# Patient Record
Sex: Male | Born: 1990 | Race: White | Hispanic: No | Marital: Single | Smoking: Current every day smoker
Health system: Southern US, Community
[De-identification: ages and names within clinical notes are randomized; demographics above are authoritative.]

## PROBLEM LIST (undated history)

## (undated) DIAGNOSIS — B1710 Acute hepatitis C without hepatic coma: Secondary | ICD-10-CM

## (undated) DIAGNOSIS — F1110 Opioid abuse, uncomplicated: Secondary | ICD-10-CM

## (undated) DIAGNOSIS — Z9119 Patient's noncompliance with other medical treatment and regimen: Secondary | ICD-10-CM

## (undated) DIAGNOSIS — Z91199 Patient's noncompliance with other medical treatment and regimen due to unspecified reason: Secondary | ICD-10-CM

## (undated) DIAGNOSIS — E109 Type 1 diabetes mellitus without complications: Secondary | ICD-10-CM

## (undated) DIAGNOSIS — I10 Essential (primary) hypertension: Secondary | ICD-10-CM

## (undated) DIAGNOSIS — E119 Type 2 diabetes mellitus without complications: Secondary | ICD-10-CM

## (undated) DIAGNOSIS — I219 Acute myocardial infarction, unspecified: Secondary | ICD-10-CM

## (undated) HISTORY — PX: NO PAST SURGERIES: SHX2092

## (undated) HISTORY — PX: OTHER SURGICAL HISTORY: SHX169

---
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
## 2012-04-17 ENCOUNTER — Inpatient Hospital Stay: Payer: Self-pay | Admitting: Internal Medicine

## 2012-04-17 LAB — URINALYSIS, COMPLETE
Bacteria: NONE SEEN
Bilirubin,UR: NEGATIVE
Blood: NEGATIVE
Glucose,UR: >=500 mg/dL (ref 0–75)
Leukocyte Esterase: NEGATIVE
Nitrite: NEGATIVE
Ph: 5 (ref 4.5–8.0)
Protein: NEGATIVE
RBC,UR: NONE SEEN /HPF (ref 0–5)
Specific Gravity: 1.029 (ref 1.003–1.030)
Squamous Epithelial: <1
WBC UR: 1 /HPF (ref 0–5)

## 2012-04-17 LAB — COMPREHENSIVE METABOLIC PANEL
Albumin: 5.5 g/dL (ref 3.8–5.6)
Alkaline Phosphatase: 289 U/L (ref 98–317)
Anion Gap: 12 (ref 7–16)
BUN: 31 mg/dL — ABNORMAL HIGH (ref 7–18)
Bilirubin,Total: 0.5 mg/dL (ref 0.2–1.0)
Calcium, Total: 10.5 mg/dL (ref 9.0–10.7)
Chloride: 96 mmol/L — ABNORMAL LOW (ref 98–107)
Co2: 26 mmol/L (ref 21–32)
Creatinine: 1.24 mg/dL (ref 0.60–1.30)
EGFR (African American): >60
EGFR (Non-African Amer.): >60
Glucose: 955 mg/dL (ref 65–99)
Osmolality: 322 (ref 275–301)
Potassium: 5.7 mmol/L — ABNORMAL HIGH (ref 3.5–5.1)
SGOT(AST): 81 U/L — ABNORMAL HIGH (ref 10–41)
SGPT (ALT): 112 U/L — ABNORMAL HIGH (ref 12–78)
Sodium: 134 mmol/L — ABNORMAL LOW (ref 136–145)
Total Protein: 9.2 g/dL — ABNORMAL HIGH (ref 6.4–8.6)

## 2012-04-17 LAB — BASIC METABOLIC PANEL
Anion Gap: 10 (ref 7–16)
BUN: 19 mg/dL — ABNORMAL HIGH (ref 7–18)
Calcium, Total: 9 mg/dL (ref 9.0–10.7)
Chloride: 108 mmol/L — ABNORMAL HIGH (ref 98–107)
Co2: 27 mmol/L (ref 21–32)
Creatinine: 0.82 mg/dL (ref 0.60–1.30)
EGFR (African American): >60
EGFR (Non-African Amer.): >60
Glucose: 264 mg/dL — ABNORMAL HIGH (ref 65–99)
Osmolality: 300 (ref 275–301)
Potassium: 4.1 mmol/L (ref 3.5–5.1)
Sodium: 145 mmol/L (ref 136–145)

## 2012-04-17 LAB — CBC
HCT: 46.9 % (ref 40.0–52.0)
HGB: 15.6 g/dL (ref 13.0–18.0)
MCH: 30.2 pg (ref 26.0–34.0)
MCHC: 33.2 g/dL (ref 32.0–36.0)
MCV: 91 fL (ref 80–100)
Platelet: 256 10*3/uL (ref 150–440)
RBC: 5.16 10*6/uL (ref 4.40–5.90)
RDW: 12.1 % (ref 11.5–14.5)
WBC: 10.9 10*3/uL — ABNORMAL HIGH (ref 3.8–10.6)

## 2012-04-17 LAB — LIPASE, BLOOD: Lipase: 70 U/L — ABNORMAL LOW (ref 73–393)

## 2012-04-18 LAB — CBC WITH DIFFERENTIAL/PLATELET
Basophil #: 0 10*3/uL (ref 0.0–0.1)
Basophil %: 0.2 %
Eosinophil #: 0.2 10*3/uL (ref 0.0–0.7)
Eosinophil %: 2.1 %
HCT: 35.9 % — ABNORMAL LOW (ref 40.0–52.0)
HGB: 12.2 g/dL — ABNORMAL LOW (ref 13.0–18.0)
Lymphocyte #: 2.3 10*3/uL (ref 1.0–3.6)
Lymphocyte %: 27.6 %
MCH: 29.9 pg (ref 26.0–34.0)
MCHC: 33.8 g/dL (ref 32.0–36.0)
MCV: 88 fL (ref 80–100)
Monocyte #: 0.4 x10 3/mm (ref 0.2–1.0)
Monocyte %: 5 %
Neutrophil #: 5.4 10*3/uL (ref 1.4–6.5)
Neutrophil %: 65.1 %
Platelet: 175 10*3/uL (ref 150–440)
RBC: 4.07 10*6/uL — ABNORMAL LOW (ref 4.40–5.90)
RDW: 12.3 % (ref 11.5–14.5)
WBC: 8.3 10*3/uL (ref 3.8–10.6)

## 2012-04-18 LAB — BASIC METABOLIC PANEL
Anion Gap: 11 (ref 7–16)
BUN: 19 mg/dL — ABNORMAL HIGH (ref 7–18)
Calcium, Total: 8.5 mg/dL — ABNORMAL LOW (ref 9.0–10.7)
Chloride: 110 mmol/L — ABNORMAL HIGH (ref 98–107)
Co2: 25 mmol/L (ref 21–32)
Creatinine: 0.73 mg/dL (ref 0.60–1.30)
EGFR (African American): >60
EGFR (Non-African Amer.): >60
Glucose: 135 mg/dL — ABNORMAL HIGH (ref 65–99)
Osmolality: 295 (ref 275–301)
Potassium: 3.6 mmol/L (ref 3.5–5.1)
Sodium: 146 mmol/L — ABNORMAL HIGH (ref 136–145)

## 2012-08-16 ENCOUNTER — Emergency Department: Payer: Self-pay | Admitting: Unknown Physician Specialty

## 2012-08-16 LAB — COMPREHENSIVE METABOLIC PANEL
Albumin: 4.3 g/dL (ref 3.8–5.6)
Alkaline Phosphatase: 205 U/L (ref 98–317)
Anion Gap: 16 (ref 7–16)
BUN: 15 mg/dL (ref 7–18)
Bilirubin,Total: 0.5 mg/dL (ref 0.2–1.0)
Calcium, Total: 8.7 mg/dL — ABNORMAL LOW (ref 9.0–10.7)
Chloride: 93 mmol/L — ABNORMAL LOW (ref 98–107)
Co2: 17 mmol/L — ABNORMAL LOW (ref 21–32)
Creatinine: 0.79 mg/dL (ref 0.60–1.30)
EGFR (African American): >60
EGFR (Non-African Amer.): >60
Glucose: 573 mg/dL (ref 65–99)
Osmolality: 281 (ref 275–301)
Potassium: 4.3 mmol/L (ref 3.5–5.1)
SGOT(AST): 82 U/L — ABNORMAL HIGH (ref 10–41)
SGPT (ALT): 174 U/L — ABNORMAL HIGH (ref 12–78)
Sodium: 126 mmol/L — ABNORMAL LOW (ref 136–145)
Total Protein: 8 g/dL (ref 6.4–8.6)

## 2012-08-16 LAB — CBC
HCT: 46.9 % (ref 40.0–52.0)
HGB: 16.1 g/dL (ref 13.0–18.0)
MCH: 30.6 pg (ref 26.0–34.0)
MCHC: 34.3 g/dL (ref 32.0–36.0)
MCV: 89 fL (ref 80–100)
Platelet: 201 10*3/uL (ref 150–440)
RBC: 5.26 10*6/uL (ref 4.40–5.90)
RDW: 12.3 % (ref 11.5–14.5)
WBC: 7.8 10*3/uL (ref 3.8–10.6)

## 2012-08-16 LAB — BASIC METABOLIC PANEL
Anion Gap: 9 (ref 7–16)
BUN: 12 mg/dL (ref 7–18)
Calcium, Total: 7.4 mg/dL — ABNORMAL LOW (ref 9.0–10.7)
Chloride: 107 mmol/L (ref 98–107)
Co2: 23 mmol/L (ref 21–32)
Creatinine: 0.63 mg/dL (ref 0.60–1.30)
EGFR (African American): >60
EGFR (Non-African Amer.): >60
Glucose: 269 mg/dL — ABNORMAL HIGH (ref 65–99)
Osmolality: 287 (ref 275–301)
Potassium: 3.7 mmol/L (ref 3.5–5.1)
Sodium: 139 mmol/L (ref 136–145)

## 2012-08-16 LAB — URINALYSIS, COMPLETE
Bacteria: NONE SEEN
Bilirubin,UR: NEGATIVE
Blood: NEGATIVE
Glucose,UR: >=500 mg/dL (ref 0–75)
Leukocyte Esterase: NEGATIVE
Nitrite: NEGATIVE
Ph: 6 (ref 4.5–8.0)
Protein: NEGATIVE
RBC,UR: NONE SEEN /HPF (ref 0–5)
Specific Gravity: 1.031 (ref 1.003–1.030)
Squamous Epithelial: NONE SEEN
WBC UR: NONE SEEN /HPF (ref 0–5)

## 2012-08-16 LAB — MAGNESIUM: Magnesium: 1.8 mg/dL

## 2012-08-16 LAB — PHOSPHORUS: Phosphorus: 3 mg/dL (ref 2.5–4.9)

## 2012-08-18 ENCOUNTER — Emergency Department: Payer: Self-pay | Admitting: Unknown Physician Specialty

## 2012-08-18 LAB — COMPREHENSIVE METABOLIC PANEL
Albumin: 4.3 g/dL (ref 3.8–5.6)
Alkaline Phosphatase: 190 U/L (ref 98–317)
Anion Gap: 12 (ref 7–16)
BUN: 17 mg/dL (ref 7–18)
Bilirubin,Total: 0.5 mg/dL (ref 0.2–1.0)
Calcium, Total: 9.1 mg/dL (ref 9.0–10.7)
Chloride: 97 mmol/L — ABNORMAL LOW (ref 98–107)
Co2: 27 mmol/L (ref 21–32)
Creatinine: 0.88 mg/dL (ref 0.60–1.30)
EGFR (African American): >60
EGFR (Non-African Amer.): >60
Glucose: 382 mg/dL — ABNORMAL HIGH (ref 65–99)
Osmolality: 289 (ref 275–301)
Potassium: 4 mmol/L (ref 3.5–5.1)
SGOT(AST): 85 U/L — ABNORMAL HIGH (ref 10–41)
SGPT (ALT): 187 U/L — ABNORMAL HIGH (ref 12–78)
Sodium: 136 mmol/L (ref 136–145)
Total Protein: 7.7 g/dL (ref 6.4–8.6)

## 2012-08-18 LAB — URINALYSIS, COMPLETE
Bilirubin,UR: NEGATIVE
Blood: NEGATIVE
Glucose,UR: >=500 mg/dL (ref 0–75)
Leukocyte Esterase: NEGATIVE
Nitrite: NEGATIVE
Ph: 6 (ref 4.5–8.0)
Protein: NEGATIVE
RBC,UR: NONE SEEN /HPF (ref 0–5)
Specific Gravity: 1.038 (ref 1.003–1.030)
Squamous Epithelial: NONE SEEN
WBC UR: NONE SEEN /HPF (ref 0–5)

## 2012-08-18 LAB — DRUG SCREEN, URINE
Amphetamines, Ur Screen: NEGATIVE (ref ?–1000)
Barbiturates, Ur Screen: NEGATIVE (ref ?–200)
Benzodiazepine, Ur Scrn: NEGATIVE (ref ?–200)
Cannabinoid 50 Ng, Ur ~~LOC~~: POSITIVE (ref ?–50)
Cocaine Metabolite,Ur ~~LOC~~: NEGATIVE (ref ?–300)
MDMA (Ecstasy)Ur Screen: NEGATIVE (ref ?–500)
Methadone, Ur Screen: NEGATIVE (ref ?–300)
Opiate, Ur Screen: NEGATIVE (ref ?–300)
Phencyclidine (PCP) Ur S: NEGATIVE (ref ?–25)
Tricyclic, Ur Screen: NEGATIVE (ref ?–1000)

## 2012-08-18 LAB — CBC
HCT: 46.4 % (ref 40.0–52.0)
HGB: 16 g/dL (ref 13.0–18.0)
MCH: 30.1 pg (ref 26.0–34.0)
MCHC: 34.5 g/dL (ref 32.0–36.0)
MCV: 87 fL (ref 80–100)
Platelet: 168 10*3/uL (ref 150–440)
RBC: 5.3 10*6/uL (ref 4.40–5.90)
RDW: 11.9 % (ref 11.5–14.5)
WBC: 7.1 10*3/uL (ref 3.8–10.6)

## 2012-08-18 LAB — LIPASE, BLOOD: Lipase: 75 U/L (ref 73–393)

## 2012-08-18 LAB — MAGNESIUM: Magnesium: 1.8 mg/dL

## 2012-11-01 ENCOUNTER — Emergency Department: Payer: Self-pay | Admitting: Internal Medicine

## 2012-11-10 ENCOUNTER — Emergency Department: Payer: Self-pay | Admitting: Emergency Medicine

## 2013-01-05 ENCOUNTER — Emergency Department: Payer: Self-pay | Admitting: Emergency Medicine

## 2013-01-05 LAB — COMPREHENSIVE METABOLIC PANEL
Albumin: 3.9 g/dL (ref 3.4–5.0)
Alkaline Phosphatase: 161 U/L — ABNORMAL HIGH (ref 50–136)
Anion Gap: 6 — ABNORMAL LOW (ref 7–16)
BUN: 14 mg/dL (ref 7–18)
Bilirubin,Total: 0.2 mg/dL (ref 0.2–1.0)
Calcium, Total: 8.9 mg/dL (ref 8.5–10.1)
Chloride: 101 mmol/L (ref 98–107)
Co2: 27 mmol/L (ref 21–32)
Creatinine: 0.87 mg/dL (ref 0.60–1.30)
EGFR (African American): >60
EGFR (Non-African Amer.): >60
Glucose: 353 mg/dL — ABNORMAL HIGH (ref 65–99)
Osmolality: 283 (ref 275–301)
Potassium: 3.9 mmol/L (ref 3.5–5.1)
SGOT(AST): 28 U/L (ref 15–37)
SGPT (ALT): 33 U/L (ref 12–78)
Sodium: 134 mmol/L — ABNORMAL LOW (ref 136–145)
Total Protein: 7.3 g/dL (ref 6.4–8.2)

## 2013-01-05 LAB — CBC
HCT: 43 % (ref 40.0–52.0)
HGB: 14.5 g/dL (ref 13.0–18.0)
MCH: 29.3 pg (ref 26.0–34.0)
MCHC: 33.8 g/dL (ref 32.0–36.0)
MCV: 87 fL (ref 80–100)
Platelet: 206 10*3/uL (ref 150–440)
RBC: 4.96 10*6/uL (ref 4.40–5.90)
RDW: 12 % (ref 11.5–14.5)
WBC: 7.4 10*3/uL (ref 3.8–10.6)

## 2013-01-29 ENCOUNTER — Inpatient Hospital Stay: Payer: Self-pay | Admitting: Internal Medicine

## 2013-01-29 LAB — CBC
HCT: 46 % (ref 40.0–52.0)
HGB: 15.2 g/dL (ref 13.0–18.0)
MCH: 29.5 pg (ref 26.0–34.0)
MCHC: 33.1 g/dL (ref 32.0–36.0)
MCV: 89 fL (ref 80–100)
Platelet: 193 10*3/uL (ref 150–440)
RBC: 5.16 10*6/uL (ref 4.40–5.90)
RDW: 12.1 % (ref 11.5–14.5)
WBC: 11.4 10*3/uL — ABNORMAL HIGH (ref 3.8–10.6)

## 2013-01-29 LAB — URINALYSIS, COMPLETE
Bacteria: NONE SEEN
Bilirubin,UR: NEGATIVE
Blood: NEGATIVE
Glucose,UR: >=500 mg/dL (ref 0–75)
Leukocyte Esterase: NEGATIVE
Nitrite: NEGATIVE
Ph: 5 (ref 4.5–8.0)
Protein: NEGATIVE
RBC,UR: <1 /HPF (ref 0–5)
Specific Gravity: 1.025 (ref 1.003–1.030)
Squamous Epithelial: NONE SEEN
WBC UR: NONE SEEN /HPF (ref 0–5)

## 2013-01-29 LAB — BASIC METABOLIC PANEL
Anion Gap: 7 (ref 7–16)
BUN: 13 mg/dL (ref 7–18)
Calcium, Total: 8 mg/dL — ABNORMAL LOW (ref 8.5–10.1)
Chloride: 111 mmol/L — ABNORMAL HIGH (ref 98–107)
Co2: 24 mmol/L (ref 21–32)
Creatinine: 0.69 mg/dL (ref 0.60–1.30)
EGFR (African American): >60
EGFR (Non-African Amer.): >60
Glucose: 121 mg/dL — ABNORMAL HIGH (ref 65–99)
Osmolality: 284 (ref 275–301)
Potassium: 3.4 mmol/L — ABNORMAL LOW (ref 3.5–5.1)
Sodium: 142 mmol/L (ref 136–145)

## 2013-01-29 LAB — COMPREHENSIVE METABOLIC PANEL
Albumin: 4.3 g/dL (ref 3.4–5.0)
Alkaline Phosphatase: 348 U/L — ABNORMAL HIGH (ref 50–136)
Anion Gap: 12 (ref 7–16)
BUN: 23 mg/dL — ABNORMAL HIGH (ref 7–18)
Bilirubin,Total: 0.6 mg/dL (ref 0.2–1.0)
Calcium, Total: 9.1 mg/dL (ref 8.5–10.1)
Chloride: 83 mmol/L — ABNORMAL LOW (ref 98–107)
Co2: 24 mmol/L (ref 21–32)
Creatinine: 1.11 mg/dL (ref 0.60–1.30)
EGFR (African American): >60
EGFR (Non-African Amer.): >60
Glucose: 1115 mg/dL (ref 65–99)
Osmolality: 300 (ref 275–301)
Potassium: 4.7 mmol/L (ref 3.5–5.1)
SGOT(AST): 79 U/L — ABNORMAL HIGH (ref 15–37)
SGPT (ALT): 132 U/L — ABNORMAL HIGH (ref 12–78)
Sodium: 119 mmol/L — CL (ref 136–145)
Total Protein: 7.8 g/dL (ref 6.4–8.2)

## 2013-02-23 ENCOUNTER — Emergency Department: Payer: Self-pay | Admitting: Emergency Medicine

## 2013-07-27 ENCOUNTER — Emergency Department: Payer: Self-pay | Admitting: Emergency Medicine

## 2013-11-18 ENCOUNTER — Emergency Department: Payer: Self-pay | Admitting: Emergency Medicine

## 2013-11-21 LAB — BETA STREP CULTURE(ARMC)

## 2013-12-10 ENCOUNTER — Inpatient Hospital Stay: Payer: Self-pay | Admitting: Family Medicine

## 2013-12-10 LAB — BASIC METABOLIC PANEL
Anion Gap: 16 (ref 7–16)
Anion Gap: 13 (ref 7–16)
BUN: 18 mg/dL (ref 7–18)
BUN: 17 mg/dL (ref 7–18)
Calcium, Total: 8.6 mg/dL (ref 8.5–10.1)
Calcium, Total: 8.1 mg/dL — ABNORMAL LOW (ref 8.5–10.1)
Chloride: 111 mmol/L — ABNORMAL HIGH (ref 98–107)
Chloride: 109 mmol/L — ABNORMAL HIGH (ref 98–107)
Co2: 12 mmol/L — ABNORMAL LOW (ref 21–32)
Co2: 18 mmol/L — ABNORMAL LOW (ref 21–32)
Creatinine: 1.05 mg/dL (ref 0.60–1.30)
Creatinine: 0.93 mg/dL (ref 0.60–1.30)
EGFR (African American): >60
EGFR (African American): >60
EGFR (Non-African Amer.): >60
EGFR (Non-African Amer.): >60
Glucose: 287 mg/dL — ABNORMAL HIGH (ref 65–99)
Glucose: 259 mg/dL — ABNORMAL HIGH (ref 65–99)
Osmolality: 290 (ref 275–301)
Osmolality: 290 (ref 275–301)
Potassium: 4.1 mmol/L (ref 3.5–5.1)
Potassium: 3.7 mmol/L (ref 3.5–5.1)
Sodium: 139 mmol/L (ref 136–145)
Sodium: 140 mmol/L (ref 136–145)

## 2013-12-10 LAB — COMPREHENSIVE METABOLIC PANEL
Albumin: 4.4 g/dL (ref 3.4–5.0)
Alkaline Phosphatase: 243 U/L — ABNORMAL HIGH
Anion Gap: 22 — ABNORMAL HIGH (ref 7–16)
BUN: 19 mg/dL — ABNORMAL HIGH (ref 7–18)
Bilirubin,Total: 0.4 mg/dL (ref 0.2–1.0)
Calcium, Total: 9.1 mg/dL (ref 8.5–10.1)
Chloride: 100 mmol/L (ref 98–107)
Co2: 11 mmol/L — ABNORMAL LOW (ref 21–32)
Creatinine: 1.23 mg/dL (ref 0.60–1.30)
EGFR (African American): >60
EGFR (Non-African Amer.): >60
Glucose: 496 mg/dL — ABNORMAL HIGH (ref 65–99)
Osmolality: 291 (ref 275–301)
Potassium: 4.4 mmol/L (ref 3.5–5.1)
SGOT(AST): 32 U/L (ref 15–37)
SGPT (ALT): 58 U/L (ref 12–78)
Sodium: 133 mmol/L — ABNORMAL LOW (ref 136–145)
Total Protein: 8.2 g/dL (ref 6.4–8.2)

## 2013-12-10 LAB — URINALYSIS, COMPLETE
Bacteria: NONE SEEN
Bilirubin,UR: NEGATIVE
Blood: NEGATIVE
Glucose,UR: >=500 mg/dL (ref 0–75)
Leukocyte Esterase: NEGATIVE
Nitrite: NEGATIVE
Ph: 5 (ref 4.5–8.0)
Protein: NEGATIVE
RBC,UR: <1 /HPF (ref 0–5)
Specific Gravity: 1.029 (ref 1.003–1.030)
Squamous Epithelial: NONE SEEN
WBC UR: <1 /HPF (ref 0–5)

## 2013-12-10 LAB — DRUG SCREEN, URINE
Amphetamines, Ur Screen: NEGATIVE (ref ?–1000)
Barbiturates, Ur Screen: NEGATIVE (ref ?–200)
Benzodiazepine, Ur Scrn: NEGATIVE (ref ?–200)
Cannabinoid 50 Ng, Ur ~~LOC~~: NEGATIVE (ref ?–50)
Cocaine Metabolite,Ur ~~LOC~~: POSITIVE (ref ?–300)
MDMA (Ecstasy)Ur Screen: NEGATIVE (ref ?–500)
Methadone, Ur Screen: NEGATIVE (ref ?–300)
Opiate, Ur Screen: NEGATIVE (ref ?–300)
Phencyclidine (PCP) Ur S: NEGATIVE (ref ?–25)
Tricyclic, Ur Screen: NEGATIVE (ref ?–1000)

## 2013-12-10 LAB — CBC
HCT: 49.4 % (ref 40.0–52.0)
HGB: 16 g/dL (ref 13.0–18.0)
MCH: 29.6 pg (ref 26.0–34.0)
MCHC: 32.4 g/dL (ref 32.0–36.0)
MCV: 92 fL (ref 80–100)
Platelet: 211 10*3/uL (ref 150–440)
RBC: 5.41 10*6/uL (ref 4.40–5.90)
RDW: 13.6 % (ref 11.5–14.5)
WBC: 12.7 10*3/uL — ABNORMAL HIGH (ref 3.8–10.6)

## 2013-12-10 LAB — PHOSPHORUS: Phosphorus: 2.6 mg/dL (ref 2.5–4.9)

## 2013-12-10 LAB — ETHANOL
Ethanol %: <0.003 % (ref 0.000–0.080)
Ethanol: <3 mg/dL

## 2013-12-10 LAB — LIPASE, BLOOD: Lipase: 60 U/L — ABNORMAL LOW (ref 73–393)

## 2013-12-10 LAB — MAGNESIUM
Magnesium: 2 mg/dL
Magnesium: 1.9 mg/dL

## 2013-12-10 LAB — TROPONIN I: Troponin-I: <0.02 ng/mL

## 2013-12-10 LAB — BETA-HYDROXYBUTYRIC ACID: Beta-Hydroxybutyrate: >46 mg/dL (ref 0.2–2.8)

## 2013-12-11 LAB — COMPREHENSIVE METABOLIC PANEL
Albumin: 2.9 g/dL — ABNORMAL LOW (ref 3.4–5.0)
Alkaline Phosphatase: 118 U/L — ABNORMAL HIGH
Anion Gap: 6 — ABNORMAL LOW (ref 7–16)
BUN: 14 mg/dL (ref 7–18)
Bilirubin,Total: 0.4 mg/dL (ref 0.2–1.0)
Calcium, Total: 8.1 mg/dL — ABNORMAL LOW (ref 8.5–10.1)
Chloride: 110 mmol/L — ABNORMAL HIGH (ref 98–107)
Co2: 23 mmol/L (ref 21–32)
Creatinine: 0.79 mg/dL (ref 0.60–1.30)
EGFR (African American): >60
EGFR (Non-African Amer.): >60
Glucose: 82 mg/dL (ref 65–99)
Osmolality: 277 (ref 275–301)
Potassium: 3.2 mmol/L — ABNORMAL LOW (ref 3.5–5.1)
SGOT(AST): 34 U/L (ref 15–37)
SGPT (ALT): 37 U/L (ref 12–78)
Sodium: 139 mmol/L (ref 136–145)
Total Protein: 5.6 g/dL — ABNORMAL LOW (ref 6.4–8.2)

## 2013-12-11 LAB — CBC WITH DIFFERENTIAL/PLATELET
Basophil #: 0 10*3/uL (ref 0.0–0.1)
Basophil %: 0.2 %
Eosinophil #: 0.2 10*3/uL (ref 0.0–0.7)
Eosinophil %: 2 %
HCT: 42.1 % (ref 40.0–52.0)
HGB: 14.4 g/dL (ref 13.0–18.0)
Lymphocyte #: 2.5 10*3/uL (ref 1.0–3.6)
Lymphocyte %: 28.9 %
MCH: 30.4 pg (ref 26.0–34.0)
MCHC: 34.2 g/dL (ref 32.0–36.0)
MCV: 89 fL (ref 80–100)
Monocyte #: 0.5 x10 3/mm (ref 0.2–1.0)
Monocyte %: 5.7 %
Neutrophil #: 5.6 10*3/uL (ref 1.4–6.5)
Neutrophil %: 63.2 %
Platelet: 163 10*3/uL (ref 150–440)
RBC: 4.73 10*6/uL (ref 4.40–5.90)
RDW: 13.4 % (ref 11.5–14.5)
WBC: 8.8 10*3/uL (ref 3.8–10.6)

## 2013-12-11 LAB — LIPID PANEL
Cholesterol: 145 mg/dL (ref 0–200)
HDL Cholesterol: 28 mg/dL — ABNORMAL LOW (ref 40–60)
Ldl Cholesterol, Calc: 96 mg/dL (ref 0–100)
Triglycerides: 107 mg/dL (ref 0–200)
VLDL Cholesterol, Calc: 21 mg/dL (ref 5–40)

## 2013-12-11 LAB — HEMOGLOBIN A1C: Hemoglobin A1C: 15.5 % — ABNORMAL HIGH (ref 4.2–6.3)

## 2013-12-11 LAB — MAGNESIUM: Magnesium: 1.6 mg/dL — ABNORMAL LOW

## 2013-12-12 LAB — BASIC METABOLIC PANEL
Anion Gap: 2 — ABNORMAL LOW (ref 7–16)
BUN: 6 mg/dL — ABNORMAL LOW (ref 7–18)
Calcium, Total: 7.7 mg/dL — ABNORMAL LOW (ref 8.5–10.1)
Chloride: 107 mmol/L (ref 98–107)
Co2: 29 mmol/L (ref 21–32)
Creatinine: 0.52 mg/dL — ABNORMAL LOW (ref 0.60–1.30)
EGFR (African American): >60
EGFR (Non-African Amer.): >60
Glucose: 155 mg/dL — ABNORMAL HIGH (ref 65–99)
Osmolality: 276 (ref 275–301)
Potassium: 2.9 mmol/L — ABNORMAL LOW (ref 3.5–5.1)
Sodium: 138 mmol/L (ref 136–145)

## 2013-12-12 LAB — MAGNESIUM: Magnesium: 1.6 mg/dL — ABNORMAL LOW

## 2014-02-04 ENCOUNTER — Emergency Department: Payer: Self-pay | Admitting: Emergency Medicine

## 2014-02-04 LAB — URINALYSIS, COMPLETE
Bacteria: NONE SEEN
Glucose,UR: >=500 mg/dL (ref 0–75)
Leukocyte Esterase: NEGATIVE
Nitrite: NEGATIVE
Ph: 5 (ref 4.5–8.0)
Protein: 30
RBC,UR: 1 /HPF (ref 0–5)
Specific Gravity: 1.043 (ref 1.003–1.030)
Squamous Epithelial: NONE SEEN
WBC UR: 1 /HPF (ref 0–5)

## 2014-02-04 LAB — CBC WITH DIFFERENTIAL/PLATELET
Basophil #: 0.1 10*3/uL (ref 0.0–0.1)
Basophil %: 0.6 %
Eosinophil #: 0.1 10*3/uL (ref 0.0–0.7)
Eosinophil %: 0.4 %
HCT: 52.1 % — ABNORMAL HIGH (ref 40.0–52.0)
HGB: 17.3 g/dL (ref 13.0–18.0)
Lymphocyte #: 0.9 10*3/uL — ABNORMAL LOW (ref 1.0–3.6)
Lymphocyte %: 5.4 %
MCH: 30.3 pg (ref 26.0–34.0)
MCHC: 33.2 g/dL (ref 32.0–36.0)
MCV: 91 fL (ref 80–100)
Monocyte #: 0.6 x10 3/mm (ref 0.2–1.0)
Monocyte %: 3.6 %
Neutrophil #: 15.1 10*3/uL — ABNORMAL HIGH (ref 1.4–6.5)
Neutrophil %: 90 %
Platelet: 239 10*3/uL (ref 150–440)
RBC: 5.7 10*6/uL (ref 4.40–5.90)
RDW: 13.2 % (ref 11.5–14.5)
WBC: 16.8 10*3/uL — ABNORMAL HIGH (ref 3.8–10.6)

## 2014-02-04 LAB — COMPREHENSIVE METABOLIC PANEL
Albumin: 4.6 g/dL (ref 3.4–5.0)
Alkaline Phosphatase: 222 U/L — ABNORMAL HIGH
Anion Gap: 5 — ABNORMAL LOW (ref 7–16)
BUN: 15 mg/dL (ref 7–18)
Bilirubin,Total: 0.8 mg/dL (ref 0.2–1.0)
Calcium, Total: 9.6 mg/dL (ref 8.5–10.1)
Chloride: 101 mmol/L (ref 98–107)
Co2: 28 mmol/L (ref 21–32)
Creatinine: 0.92 mg/dL (ref 0.60–1.30)
EGFR (African American): >60
EGFR (Non-African Amer.): >60
Glucose: 343 mg/dL — ABNORMAL HIGH (ref 65–99)
Osmolality: 283 (ref 275–301)
Potassium: 4.2 mmol/L (ref 3.5–5.1)
SGOT(AST): 46 U/L — ABNORMAL HIGH (ref 15–37)
SGPT (ALT): 83 U/L — ABNORMAL HIGH (ref 12–78)
Sodium: 134 mmol/L — ABNORMAL LOW (ref 136–145)
Total Protein: 9 g/dL — ABNORMAL HIGH (ref 6.4–8.2)

## 2014-02-04 LAB — LIPASE, BLOOD: Lipase: 49 U/L — ABNORMAL LOW (ref 73–393)

## 2014-02-06 LAB — BETA STREP CULTURE(ARMC)

## 2014-03-14 ENCOUNTER — Emergency Department: Payer: Self-pay | Admitting: Student

## 2014-03-14 LAB — CBC WITH DIFFERENTIAL/PLATELET
Basophil #: 0.1 10*3/uL (ref 0.0–0.1)
Basophil %: 1.3 %
Eosinophil #: 0.1 10*3/uL (ref 0.0–0.7)
Eosinophil %: 1.4 %
HCT: 42 % (ref 40.0–52.0)
HGB: 14.9 g/dL (ref 13.0–18.0)
Lymphocyte #: 2.1 10*3/uL (ref 1.0–3.6)
Lymphocyte %: 24.7 %
MCH: 31.8 pg (ref 26.0–34.0)
MCHC: 35.5 g/dL (ref 32.0–36.0)
MCV: 90 fL (ref 80–100)
Monocyte #: 0.7 x10 3/mm (ref 0.2–1.0)
Monocyte %: 7.6 %
Neutrophil #: 5.7 10*3/uL (ref 1.4–6.5)
Neutrophil %: 65 %
Platelet: 233 10*3/uL (ref 150–440)
RBC: 4.69 10*6/uL (ref 4.40–5.90)
RDW: 12.2 % (ref 11.5–14.5)
WBC: 8.7 10*3/uL (ref 3.8–10.6)

## 2014-03-14 LAB — BASIC METABOLIC PANEL
Anion Gap: 9 (ref 7–16)
BUN: 17 mg/dL (ref 7–18)
Calcium, Total: 8.4 mg/dL — ABNORMAL LOW (ref 8.5–10.1)
Chloride: 93 mmol/L — ABNORMAL LOW (ref 98–107)
Co2: 27 mmol/L (ref 21–32)
Creatinine: 0.83 mg/dL (ref 0.60–1.30)
EGFR (African American): >60
EGFR (Non-African Amer.): >60
Glucose: 691 mg/dL (ref 65–99)
Osmolality: 293 (ref 275–301)
Potassium: 4.1 mmol/L (ref 3.5–5.1)
Sodium: 129 mmol/L — ABNORMAL LOW (ref 136–145)

## 2014-03-14 LAB — URINALYSIS, COMPLETE
Bacteria: NONE SEEN
Bilirubin,UR: NEGATIVE
Blood: NEGATIVE
Glucose,UR: >=500 mg/dL (ref 0–75)
Leukocyte Esterase: NEGATIVE
Nitrite: NEGATIVE
Ph: 7 (ref 4.5–8.0)
Protein: NEGATIVE
RBC,UR: NONE SEEN /HPF (ref 0–5)
Specific Gravity: 1.03 (ref 1.003–1.030)
Squamous Epithelial: NONE SEEN
WBC UR: NONE SEEN /HPF (ref 0–5)

## 2014-03-14 LAB — TROPONIN I: Troponin-I: <0.02 ng/mL

## 2014-03-14 LAB — BETA-HYDROXYBUTYRIC ACID: Beta-Hydroxybutyrate: 9.33 mg/dL — ABNORMAL HIGH (ref 0.2–2.8)

## 2014-03-16 ENCOUNTER — Emergency Department: Payer: Self-pay | Admitting: Emergency Medicine

## 2014-03-16 LAB — CBC WITH DIFFERENTIAL/PLATELET
Basophil #: 0 10*3/uL (ref 0.0–0.1)
Basophil %: 0.5 %
Eosinophil #: 0.1 10*3/uL (ref 0.0–0.7)
Eosinophil %: 1.7 %
HCT: 44.7 % (ref 40.0–52.0)
HGB: 14.4 g/dL (ref 13.0–18.0)
Lymphocyte #: 1.9 10*3/uL (ref 1.0–3.6)
Lymphocyte %: 25.4 %
MCH: 29.7 pg (ref 26.0–34.0)
MCHC: 32.2 g/dL (ref 32.0–36.0)
MCV: 92 fL (ref 80–100)
Monocyte #: 0.5 x10 3/mm (ref 0.2–1.0)
Monocyte %: 6.5 %
Neutrophil #: 5 10*3/uL (ref 1.4–6.5)
Neutrophil %: 65.9 %
Platelet: 218 10*3/uL (ref 150–440)
RBC: 4.85 10*6/uL (ref 4.40–5.90)
RDW: 12.4 % (ref 11.5–14.5)
WBC: 7.6 10*3/uL (ref 3.8–10.6)

## 2014-03-16 LAB — BASIC METABOLIC PANEL
Anion Gap: 9 (ref 7–16)
BUN: 12 mg/dL (ref 7–18)
Calcium, Total: 8.7 mg/dL (ref 8.5–10.1)
Chloride: 96 mmol/L — ABNORMAL LOW (ref 98–107)
Co2: 25 mmol/L (ref 21–32)
Creatinine: 0.83 mg/dL (ref 0.60–1.30)
EGFR (African American): >60
EGFR (Non-African Amer.): >60
Glucose: 664 mg/dL (ref 65–99)
Osmolality: 292 (ref 275–301)
Potassium: 4.3 mmol/L (ref 3.5–5.1)
Sodium: 130 mmol/L — ABNORMAL LOW (ref 136–145)

## 2014-04-05 ENCOUNTER — Observation Stay: Payer: Self-pay | Admitting: Internal Medicine

## 2014-04-05 LAB — CBC WITH DIFFERENTIAL/PLATELET
Basophil #: 0 10*3/uL (ref 0.0–0.1)
Basophil %: 0.2 %
Eosinophil #: 0.1 10*3/uL (ref 0.0–0.7)
Eosinophil %: 0.6 %
HCT: 43.4 % (ref 40.0–52.0)
HGB: 14.6 g/dL (ref 13.0–18.0)
Lymphocyte #: 2 10*3/uL (ref 1.0–3.6)
Lymphocyte %: 14.7 %
MCH: 30.2 pg (ref 26.0–34.0)
MCHC: 33.6 g/dL (ref 32.0–36.0)
MCV: 90 fL (ref 80–100)
Monocyte #: 1.1 x10 3/mm — ABNORMAL HIGH (ref 0.2–1.0)
Monocyte %: 8.1 %
Neutrophil #: 10.4 10*3/uL — ABNORMAL HIGH (ref 1.4–6.5)
Neutrophil %: 76.4 %
Platelet: 176 10*3/uL (ref 150–440)
RBC: 4.82 10*6/uL (ref 4.40–5.90)
RDW: 12.5 % (ref 11.5–14.5)
WBC: 13.7 10*3/uL — ABNORMAL HIGH (ref 3.8–10.6)

## 2014-04-05 LAB — MONONUCLEOSIS SCREEN: Mono Test: NEGATIVE

## 2014-04-05 LAB — COMPREHENSIVE METABOLIC PANEL
Albumin: 3.4 g/dL (ref 3.4–5.0)
Alkaline Phosphatase: 196 U/L — ABNORMAL HIGH
Anion Gap: 9 (ref 7–16)
BUN: 17 mg/dL (ref 7–18)
Bilirubin,Total: 0.3 mg/dL (ref 0.2–1.0)
Calcium, Total: 8.8 mg/dL (ref 8.5–10.1)
Chloride: 98 mmol/L (ref 98–107)
Co2: 26 mmol/L (ref 21–32)
Creatinine: 0.84 mg/dL (ref 0.60–1.30)
EGFR (African American): >60
EGFR (Non-African Amer.): >60
Glucose: 426 mg/dL — ABNORMAL HIGH (ref 65–99)
Osmolality: 286 (ref 275–301)
Potassium: 3.9 mmol/L (ref 3.5–5.1)
SGOT(AST): 24 U/L (ref 15–37)
SGPT (ALT): 34 U/L
Sodium: 133 mmol/L — ABNORMAL LOW (ref 136–145)
Total Protein: 7.3 g/dL (ref 6.4–8.2)

## 2014-04-06 LAB — BASIC METABOLIC PANEL
Anion Gap: 7 (ref 7–16)
BUN: 13 mg/dL (ref 7–18)
Calcium, Total: 8.1 mg/dL — ABNORMAL LOW (ref 8.5–10.1)
Chloride: 105 mmol/L (ref 98–107)
Co2: 25 mmol/L (ref 21–32)
Creatinine: 0.64 mg/dL (ref 0.60–1.30)
EGFR (African American): >60
EGFR (Non-African Amer.): >60
Glucose: 257 mg/dL — ABNORMAL HIGH (ref 65–99)
Osmolality: 283 (ref 275–301)
Potassium: 3.8 mmol/L (ref 3.5–5.1)
Sodium: 137 mmol/L (ref 136–145)

## 2014-04-06 LAB — CBC WITH DIFFERENTIAL/PLATELET
Basophil #: 0 10*3/uL (ref 0.0–0.1)
Basophil %: 0.3 %
Eosinophil #: 0.1 10*3/uL (ref 0.0–0.7)
Eosinophil %: 0.9 %
HCT: 38.7 % — ABNORMAL LOW (ref 40.0–52.0)
HGB: 13.1 g/dL (ref 13.0–18.0)
Lymphocyte #: 2.5 10*3/uL (ref 1.0–3.6)
Lymphocyte %: 19.7 %
MCH: 30.7 pg (ref 26.0–34.0)
MCHC: 33.8 g/dL (ref 32.0–36.0)
MCV: 91 fL (ref 80–100)
Monocyte #: 1.2 x10 3/mm — ABNORMAL HIGH (ref 0.2–1.0)
Monocyte %: 9.3 %
Neutrophil #: 8.7 10*3/uL — ABNORMAL HIGH (ref 1.4–6.5)
Neutrophil %: 69.8 %
Platelet: 157 10*3/uL (ref 150–440)
RBC: 4.27 10*6/uL — ABNORMAL LOW (ref 4.40–5.90)
RDW: 12.7 % (ref 11.5–14.5)
WBC: 12.5 10*3/uL — ABNORMAL HIGH (ref 3.8–10.6)

## 2014-04-06 LAB — URINALYSIS, COMPLETE
Bacteria: NONE SEEN
Bilirubin,UR: NEGATIVE
Blood: NEGATIVE
Glucose,UR: >=500 mg/dL (ref 0–75)
Leukocyte Esterase: NEGATIVE
Nitrite: NEGATIVE
Ph: 6 (ref 4.5–8.0)
Protein: NEGATIVE
RBC,UR: NONE SEEN /HPF (ref 0–5)
Specific Gravity: 1.026 (ref 1.003–1.030)
Squamous Epithelial: NONE SEEN
WBC UR: NONE SEEN /HPF (ref 0–5)

## 2014-04-06 LAB — HEMOGLOBIN A1C: Hemoglobin A1C: 15 % — ABNORMAL HIGH (ref 4.2–6.3)

## 2014-04-08 LAB — BETA STREP CULTURE(ARMC)

## 2014-04-10 LAB — CULTURE, BLOOD (SINGLE)

## 2014-06-25 ENCOUNTER — Emergency Department (HOSPITAL_COMMUNITY)
Admission: EM | Admit: 2014-06-25 | Discharge: 2014-06-25 | Disposition: A | Discharge status: Home / Self Care | Payer: Self-pay | Attending: Emergency Medicine | Admitting: Emergency Medicine

## 2014-06-25 ENCOUNTER — Encounter (HOSPITAL_COMMUNITY): Payer: Self-pay | Admitting: *Deleted

## 2014-06-25 DIAGNOSIS — K006 Disturbances in tooth eruption: Secondary | ICD-10-CM | POA: Insufficient documentation

## 2014-06-25 DIAGNOSIS — K047 Periapical abscess without sinus: Secondary | ICD-10-CM | POA: Insufficient documentation

## 2014-06-25 DIAGNOSIS — K0510 Chronic gingivitis, plaque induced: Secondary | ICD-10-CM | POA: Insufficient documentation

## 2014-06-25 DIAGNOSIS — Z720 Tobacco use: Secondary | ICD-10-CM | POA: Insufficient documentation

## 2014-06-25 DIAGNOSIS — K029 Dental caries, unspecified: Secondary | ICD-10-CM | POA: Insufficient documentation

## 2014-06-25 MED ORDER — NAPROXEN 500 MG PO TABS: 500.0000 mg | ORAL_TABLET | Freq: Two times a day (BID) | ORAL | Status: DC

## 2014-06-25 MED ORDER — AMOXICILLIN 250 MG PO CAPS
500.0000 mg | ORAL_CAPSULE | Freq: Once | ORAL | Status: AC
  Administered 2014-06-25: 500 mg via ORAL
  Filled 2014-06-25: qty 2

## 2014-06-25 MED ORDER — AMOXICILLIN 500 MG PO CAPS: 500.0000 mg | ORAL_CAPSULE | Freq: Three times a day (TID) | ORAL | Status: DC

## 2014-06-25 MED ORDER — NAPROXEN 250 MG PO TABS
500.0000 mg | ORAL_TABLET | Freq: Once | ORAL | Status: AC
  Administered 2014-06-25: 500 mg via ORAL
  Filled 2014-06-25: qty 2

## 2014-06-25 NOTE — ED Notes (Signed)
Pt c/o dental pain. Has a dentist appt Monday can't remember name.

## 2014-06-25 NOTE — ED Notes (Signed)
Patient with no complaints at this time. Respirations even and unlabored. Skin warm/dry. Discharge instructions reviewed with patient at this time. Patient given opportunity to voice concerns/ask questions. Patient discharged at this time and left Emergency Department with steady gait.   

## 2014-06-25 NOTE — ED Provider Notes (Signed)
CSN: [id number]     Arrival date & time 06/25/14  1859 History   First MD Initiated Contact with Patient 06/25/14 2107     Chief Complaint  Patient presents with  . Dental Pain     (Consider location/radiation/quality/duration/timing/severity/associated sxs/prior Treatment) The history is provided by the patient.   Emeline Darlingony B Tromp is a 23 y.o. male  Presenting with a  One week history of dental pain and gingival swelling.   The patient has a history of chronic dental decay in the teeth involved which has recently started to cause increased  pain.  There has been no fevers, chills, nausea or vomiting, also no complaint of difficulty swallowing, although chewing makes pain worse.  The patient has tried ibuprofen without relief of symptoms.  He [place] in [place] last weeks ago and was advised his teeth are "too messed up" and was referred to [facility], and end[facility] but does not have transportation to get there.  He is desirous of a local dental referral.        History reviewed. No pertinent past medical history. History reviewed. No pertinent past surgical history. History reviewed. No pertinent family history. History  Substance Use Topics  . Smoking status: Current Every Day Smoker  . Smokeless tobacco: Not on file  . Alcohol Use: No    Review of Systems  Constitutional: Negative for fever.  HENT: Positive for dental problem. Negative for facial swelling and sore throat.   Respiratory: Negative for shortness of breath.   Musculoskeletal: Negative for neck pain and neck stiffness.      Allergies  Tramadol  Home Medications   Prior to Admission medications   Not on File   BP 127/70 mmHg  Pulse 80  Temp(Src) 98.7 F (37.1 C) (Oral)  Resp 18  Ht 5\' 6"  (1.676 m)  Wt 120 lb (54.432 kg)  BMI 19.38 kg/m2  SpO2 100% Physical Exam  Constitutional: He is oriented to person, place, and time. He appears well-developed  and well-nourished. No distress.  HENT:  Head: Normocephalic and atraumatic.  Right Ear: Tympanic membrane and external ear normal.  Left Ear: Tympanic membrane and external ear normal.  Mouth/Throat: Oropharynx is clear and moist and mucous membranes are normal. No oral lesions. Dental abscesses present.  Generalized poor dentition with multiple cavities and gingivitis.  Right upper lateral and canine missing the buccal surface enamel completely. Maybe early abscess formation at root of right upper lateral incisor.  Eyes: Conjunctivae are normal.  Neck: Normal range of motion. Neck supple.  Cardiovascular: Normal rate and normal heart sounds.   Pulmonary/Chest: Effort normal.  Abdominal: He exhibits no distension.  Musculoskeletal: Normal range of motion.  Lymphadenopathy:    He has no cervical adenopathy.  Neurological: He is alert and oriented to person, place, and time.  Skin: Skin is warm and dry. No erythema.  Psychiatric: He has a normal mood and affect.    ED Course  Procedures (including critical care time) Labs Review Labs Reviewed - No data to display  Imaging Review No results found.   EKG Interpretation None      MDM   Final diagnoses:  Dental decay    Will treat for possible early abscess although I suspect majority of his dental issues are old and chronic, no obvious fluctuant abscess or any swelling amenable to I&D at this time.  He was put on amoxicillin.  Prescribed naproxen.  Dental  referrals given.  When necessary follow-up anticipated.    Burgess AmorJulie Michaelah Credeur, PA-C 06/25/14 2134  Benny LennertJoseph L Zammit, MD 06/26/14 0001

## 2014-06-25 NOTE — Discharge Instructions (Signed)
Dental Caries Dental caries is tooth decay. This decay can cause a hole in teeth (cavity) that can get bigger and deeper over time. HOME CARE  Brush and floss your teeth. Do this at least two times a day.  Use a fluoride toothpaste.  Use a mouth rinse if told by your dentist or doctor.  Eat less sugary and starchy foods. Drink less sugary drinks.  Avoid snacking often on sugary and starchy foods. Avoid sipping often on sugary drinks.  Keep regular checkups and cleanings with your dentist.  Use fluoride supplements if told by your dentist or doctor.  Allow fluoride to be applied to teeth if told by your dentist or doctor. Document Released: 04/16/2008 Document Revised: 11/22/2013 Document Reviewed: 07/10/2012 [facility] Patient Information 2015 [employer] [place]. This information is not intended to replace advice given to you by your health care provider. Make sure you discuss any questions you have with your health care provider.    Complete your entire course of antibiotics as prescribed.  Use naproxen in place of ibuprofen which may give better pain relief.  Avoid applying heat or ice to this abscess area which can worsen your symptoms.  You may use warm salt water swish and spit treatment or half peroxide and water swish and spit after meals to keep this area clean as discussed.  Call the dentist listed above for further management of your symptoms.

## 2014-09-15 ENCOUNTER — Emergency Department: Payer: Self-pay | Admitting: Internal Medicine

## 2014-09-26 ENCOUNTER — Emergency Department: Payer: Self-pay | Admitting: Emergency Medicine

## 2014-11-08 NOTE — Consult Note (Signed)
Chief Complaint and History:   Referring Physician Dr. Sherryll BurgerShah    Chief Complaint Hyperglycemia/new onset diabetes   Allergies:  Bee Stings: Swelling  Ibuprofen: N/V/Diarrhea, Resp. Distress  Tramadol: N/V/Diarrhea  Assessment/Plan:   Assessment/Plan 24 yo M with no past med hx admitted today with BS >300 and 3-4 days of N/V/wegith loss. He has been started on IV insulin and IVF and sugars are now in the 200s.  A/ 1. New onset diabetes, likely type 1 2. Hyperkalemia  3. Mildly elev creatinine 1.2 due to dehydration 4. Elevated LFTs 5. Tobacco dependence  P/ Will assist with transition off IV insulin to SQ basal-bolus insulin. Will initiate Lantus 12 units q24 hours once FSBS <180. Will also then start scheduled NovoLog 3 units tid AC plus SSI.   Counseled to quit smoking.  Diabetes educators to see patient  Will need Case Mgmt to assist with getting medical insurance and/or [facility] and [facility] appt.  I will follow with you. I will see patient again tomorrow monring. Full consult has been dictated.   Electronic Signatures: Raj JanusSolum, Anna M (MD)  (Signed 27-Sep-13 16:36)  Authored: Chief Complaint and History, ALLERGIES, Assessment/Plan   Last Updated: 27-Sep-13 16:36 by Raj JanusSolum, Anna M (MD)

## 2014-11-08 NOTE — Discharge Summary (Signed)
PATIENT NAME:  Trevor DarlingBAILEY, Zaccheus B MR#:  [id number] DATE OF BIRTH:  [birth year]  DATE OF ADMISSION:  04/17/2012 DATE OF DISCHARGE:  04/18/2012  DISCHARGE DIAGNOSES:  1. Nonketotic hyperglycemia, now resolved. 2. New onset diabetes type 1, started on insulin and given diabetic education.  3. Elevated liver function tests of unknown etiology. 4. Hyperkalemia, resolved.   CONSULTATION: Endocrinology, Dr. Tedd SiasSolum.  PROCEDURES/RADIOLOGY: Abdominal ultrasound on the 28th of September was negative for any acute cholecystitis. There was trace amount of free fluid in the [place] pouch. Chest x-ray on the 27th of 03/2012 showed no acute cardiopulmonary disease. Urinalysis on admission was negative.   HISTORY AND SHORT HOSPITAL COURSE: The patient is a 24 year old male with no significant medical problems who was admitted for nonketotic hyperglycemia. Please see Dr. Margaretmary EddyShah's dictated History and Physical for further details. The patient was started on insulin drip and endocrine consultation was obtained with Dr. Tedd SiasSolum. His admission sugar was more than 900 and being on insulin drip his sugar started coming down and he was converted to subcutaneous insulin per Dr. Tedd SiasSolum. The patient was given diabetic education by the [facility] and was also given education on how to give insulin injections. His sugars improved significantly in 24 hours and he was doing much better and was discharged home on the 28th of September and stable condition.    VITAL SIGNS: On the date of discharge, his vital signs were as follows: Temperature 97.8, heart rate 58 per minute, respirations 17 per minute, blood pressure 110/51 mmHg. He was saturating 98% on room air.   PERTINENT PHYSICAL EXAMINATION:  On the date of discharge, CARDIOVASCULAR: S1, S2 normal. No murmur, rubs, or gallop. LUNGS: Clear to auscultation bilaterally. No wheezing, rales, rhonchi, or crepitation. ABDOMEN: Soft, benign. NEUROLOGIC: Nonfocal examination. All other  physical examination remained at baseline.   DISCHARGE MEDICATIONS:  1. Insulin Lantus 10 units subcutaneous at bedtime. 2. Insulin aspart 3 units subcutaneous with each meal t.i.d.   DISCHARGE DIET: 1800 ADA.   DISCHARGE ACTIVITY: As tolerated.   DISCHARGE INSTRUCTIONS AND FOLLOWUP:  1. The patient was instructed to follow up with his new primary care physician at the [facility] in 1 to 2 weeks. 2. He will need followup with [facility] in 2 to 4 weeks.  3. He was also requested to follow up with Dr. Tedd SiasSolum from endocrinology in 2 to 4 weeks.   TOTAL TIME DISCHARGING THIS PATIENT: 55 minutes.   ____________________________ Ellamae SiaVipul S. Sherryll BurgerShah, MD vss:bjt D: 04/18/2012 21:08:16 ET T: 04/20/2012 09:49:40 ET JOB#: [id number]  cc: Bryana Froemming S. Sherryll BurgerShah, MD, <Dictator> [facility] A. Wendall MolaMelissa Solum, MD Patricia PesaVIPUL S Shey Yott MD ELECTRONICALLY SIGNED 04/21/2012 0:17

## 2014-11-08 NOTE — H&P (Signed)
PATIENT NAME:  Trevor Brown, Trevor Brown MR#:  [id number] DATE OF BIRTH:  06-Jun-1991  DATE OF ADMISSION:  04/17/2012  PRIMARY CARE PHYSICIAN: None.   REFERRING PHYSICIAN: Dr. Mindi JunkerGottlieb.   CHIEF COMPLAINT: Nausea, vomiting, and weight loss.   HISTORY OF PRESENT ILLNESS: The patient is a 24 year old healthy male being admitted for nonketotic hyperglycemia. The patient started having nausea and vomiting for the last 3 to 4 days. He also noticed significant weight loss. He was weighing 120 pounds and now he is weighing 192 pounds. He also felt very weak and dehydrated with pale and dry mucous membranes. He was also complaining of abdominal pain. While in the ED he was found to have blood sugar of more than 900 and was started on insulin drip. Subsequently, he is being admitted for further evaluation and management.   PAST MEDICAL HISTORY: None.   FAMILY HISTORY: Positive for diabetes in both sides grandmothers.   SOCIAL HISTORY: Smokes about 1 pack of cigarettes daily for at least the last four years. Denies any alcohol. Denies any IV drugs of abuse. He works in a [employer], [employer] company.   HOME MEDICATIONS: None.   ALLERGIES: Bee stings, tramadol and ibuprofen.   REVIEW OF SYSTEMS: CONSTITUTIONAL: No fever. Positive fatigue and weakness. Weight loss of about 30 pounds in 2 to 4 days. EYES: No blurry or double vision. ENT: No tinnitus or ear pain. RESPIRATORY: No cough, wheezing, hemoptysis. CARDIOVASCULAR: No chest pain, orthopnea or edema. GASTROINTESTINAL: Positive for nausea, vomiting, and abdominal pain. GU: No dysuria or hematuria. ENDOCRINE: Positive for polyuria, weight loss and polydipsia. NEUROLOGIC: No tingling, numbness, or weakness. PSYCHIATRIC: No anxiety or depression. SKIN: No obvious rash, lesion, or ulcer.   PHYSICAL EXAMINATION:  VITAL SIGNS: Temperature 97.3, heart rate 60 per minute, respirations 18 per minute, blood pressure 113/58. He is saturating 100% on room air.   GENERAL: The  patient is a 24 year old male lying in the bed comfortably without any acute distress.   EYES: Pupils equal, reactive to light and accommodation. No scleral icterus. Extraocular muscles intact.   HEENT: Head atraumatic, normocephalic. Oropharynx and nasopharynx clear.   NECK: Supple. No jugular venous distention. No thyroid enlargement or tenderness.   LUNGS: Clear to auscultation bilaterally. No wheezes, rales, rhonchi, or crepitation.   ABDOMEN: Soft, nontender, nondistended. Bowel sounds present. No organomegaly or mass.   EXTREMITIES: No pedal edema, cyanosis, or clubbing.   NEUROLOGIC: Nonfocal examination. Cranial nerves 2-12 intact. Muscle strength five out of five in all extremities. Sensation intact.   PSYCH: The patient is oriented to time, place and person x3.   SKIN: Dry, pale, chapped lower lips.   LABORATORY, RADIOLOGICAL AND DIAGNOSTIC DATA: Sodium 134, potassium 5.7, chloride 96, CO2 26, BUN 31, creatinine 1.24, blood sugar 955. Normal liver function tests except AST 81, ALT 112. Normal CBC except hemoglobin of 10.9. Negative urinalysis. ABG showed pH of 7.28, pCO2 of 53. Chest x-ray in the Emergency Department showed no acute cardiopulmonary disease.   IMPRESSION AND PLAN:  1. Nonketotic hyperglycemia. We will start him on insulin drip, consult endocrine and monitor him closely with likely hourly blood sugar until sugar comes down less than 200 and we can switch him to subcutaneous insulin.  2. New onset diabetes, likely type 1. He does have a strong family history and also symptoms with weight loss and polyuria. We will consult diabetic educator [facility] and consult dietitian also for dietary instructions.  3. Elevated LFTs. We will check hepatitis panel  as this seems more cholestatic pattern. We will check abdominal ultrasound also considering his symptoms of nausea, vomiting, and abdominal pain.  4. Hyperkalemia. This should improve with hydration.   5. Tobacco abuse. He was counseled for about three minutes. He is not ready to quit. He does request strongest nicotine patch as he is craving to go out and smoke and we will start him on 21 mg nicotine patch at this time.   CODE STATUS: FULL CODE.   TOTAL TIME TAKING CARE OF THIS PATIENT: 55 minutes.    ____________________________ Ellamae Sia. Sherryll Burger, MD vss:ap D: 04/17/2012 14:45:11 ET T: 04/17/2012 15:11:23 ET JOB#: [id number]  cc: Ronetta Molla S. Sherryll Burger, MD, <Dictator> Ellamae Sia [facility] MD ELECTRONICALLY SIGNED 04/18/2012 19:58

## 2014-11-08 NOTE — Consult Note (Signed)
PATIENT NAME:  Trevor Brown, SCHARA MR#:  [id number] DATE OF BIRTH:  [birth year]  DATE OF CONSULTATION:  04/17/2012  REFERRING PHYSICIAN:   CONSULTING PHYSICIAN:  A. Wendall Mola, MD  PRIMARY CARE PHYSICIAN: Delfino Lovett, MD.   CHIEF COMPLAINT: Severe hyperglycemia due to new onset diabetes.  HISTORY OF PRESENT ILLNESS:  This is a 24 year old male with no known medical history who presented today with 3 to 4 days of nausea and vomiting, weakness, and reported 28 pound weight loss. In the ED, blood sugar was found to be greater than 900, he had 1+ urinary ketones, normal bicarbonate level and a normal anion gap. He was initiated on IV insulin and IV fluids. Sugars have improved greatly and are now in the 200s. His nausea and vomiting have resolved. Currently, he has no pain   PAST MEDICAL HISTORY: None.   PAST SURGICAL HISTORY: None.   SOCIAL HISTORY: The patient lives with his parents. He is single. He smokes 1 pack per day for the last five years. No alcohol use. Dropped out of high school in tenth grade. Employed part-time for a tree [employer].   FAMILY HISTORY: A paternal grandmother had type 1 diabetes. Father has hypertension. Sister has some sort of cancer, details not known.   OUTPATIENT MEDICATIONS: None.  INPATIENT MEDICATIONS:  1. IV insulin.  2. Normal saline 0.9% at 100 mL/h.   ALLERGIES: Tramadol and ibuprofen.   REVIEW OF SYSTEMS: GENERAL: Weight loss as per history of present illness. No fever.   HEENT: Denies blurred vision. He has had some sore throat. NECK: No neck pain. No dysphasia. CARDIAC: No chest pain or palpitation. PULMONARY: No cough. No shortness of breath. No wheeze. ABDOMEN: No abdominal tenderness. Nausea has resolved. No change in bowel habits. EXTREMITIES: Denies leg swelling. SKIN: Denies rash or pruritus. ENDOCRINE: Denies heat or cold intolerance. He has had an increased frequency of urination. GENITOURINARY: Denies hematuria or dysuria. NEUROLOGIC:  Report some weakness. No recent falls. HEME: Denies easy bruisability or recent bleeding.   PHYSICAL EXAMINATION:  VITAL SIGNS: Height 68.9 inches, weight 92 pounds, 41.7 kg, BMI 13.6. Temperature 97.3, pulse 56, respirations 18, blood pressure 110/64, pulse oximetry 99% on room air.   GENERAL: This is a thin white male in no acute distress, appears younger than his stated age.   HEENT: Extraocular movements are intact. No proptosis. No lid lag. Oropharynx is clear. Mucous membranes moist.   NECK: Supple. No thyromegaly.   LUNGS: Clear to auscultation bilaterally. No wheeze. No rhonchi.   ABDOMEN: Diffusely soft, nontender, and nondistended.   CARDIAC: Regular rate and rhythm without murmur.   EXTREMITIES: No edema is present.   NEUROLOGIC: No sensory deficits are present.   SKIN: No rash. Bilateral barefoot exam reveals no calluses or lesions.   NEUROLOGIC: No tremors present.   PSYCHIATRIC: Alert and oriented x3, calm, cooperative.   LABORATORY ON PRESENTATION: Glucose 955, BUN 31, creatinine 1.24, sodium 134, potassium 5.7, lipase 70, calcium 10.5, total protein 9.2, AST 81, ALT 112. Urinalysis showed greater than 500 glucose, 1+ ketones, negative blood, protein, nitrate and leukocyte esterase. WBC 10.9, hemoglobin 15.6, hematocrit 46.9, platelets 256.   ASSESSMENT: This is a 24 year old male with evidence of severe hyperglycemia, weight loss, nausea, vomiting, suggestive of new onset type 1 diabetes. He has low level of ketosis in his urine without any evidence of acidosis. Additionally, he had mild elevation of his liver function tests and he has tobacco dependence.   PLAN:  1.  Counseled the patient and his mother at length about diagnosis of diabetes. He is receiving diabetes education and will be taught about the importance of multiple daily injections of insulin as well as regular blood sugar monitoring. We will need to make sure he gets a glucometer before he leaves.   2. Will assist with transition off the IV insulin. I advised that once blood sugars are under the 180, we will reduce his IV insulin to just 1 unit/h. We will then initiate basal-bolus insulin weight-based on a 24-hour insulin requirement of 0.5 units/kg/day. For 41 kg, will estimate his insulin requirement as about 20 units per day. Plan to start Lantus 10 units subcutaneous once daily and NovoLog 3 units before meals in addition to a NovoLog sliding scale. IV insulin will be stopped two hours after subcutaneous Lantus is given.  3. I agree with ultrasound for the elevated liver function tests.  4. Hyperkalemia. Should resolve with IV insulin. Monitor electrolytes daily.  5. Will obtain a hemoglobin A1c and a C-peptide and glucose level, if not yet ordered.  6. The patient was advised to quit smoking.  7. The patient lacks medical insurance and we will need care management to assist with setting him up for [facility] and perhaps helping him apply for Medicaid.   Thank you for the kind request for consultation. I will follow along with you.    ____________________________ A. Wendall MolaMelissa Solum, MD ams:ap D: 04/17/2012 16:31:44 ET T: 04/17/2012 16:57:49 ET JOB#: [id number]  cc: A. Wendall MolaMelissa Solum, MD, <Dictator> Macy MisA. MELISSA SOLUM MD ELECTRONICALLY SIGNED 05/05/2012 17:00

## 2014-11-11 NOTE — Discharge Summary (Signed)
PATIENT NAME:  Trevor DarlingBAILEY, Shady B MR#:  [id number] DATE OF BIRTH:  Dec 06, 1990  DATE OF ADMISSION:  01/29/2013 DATE OF DISCHARGE:  01/29/2013  PRIMARY CARE PHYSICIAN:  None.   DISCHARGE DIAGNOSES: 1.  Nonketotic hyperglycemia.  2.  Noncompliance.  3.  Dehydration.  4.  Hypokalemia.  5.  Elevation in liver function tests.   ADMITTING HISTORY AND PHYSICAL:  Please see detailed H and P dictated by Dr. Heron NayVasireddy.  In brief, a 24 year old male patient recently diagnosed with diabetes, presented to the hospital complaining of high blood sugars.  The patient ran out of his insulin three weeks back and has had polyuria, polydipsia, generalized weakness.  He also had diffuse myalgias.  The patient was found to have blood sugars extremely high, admitted to the hospitalist service with nonketotic hyperglycemia on insulin drip.  The patient improved well over the few hours of stay.  Anion gap was normal.  Bicarb was in the normal range.  Blood sugars were 224.  At 250 mg/dl he was switched to D5 normal saline, started on his home dose of Lantus, changed to regular IV fluids without D5.  The patient did well, had his lunch.  He is doing well.  Does complain of some chronic right shoulder pain which is chronic and he will be placed on naproxen as outpatient as needed.   I have counseled the patient to be compliant with his insulin, given him prescriptions, asked him to find a primary care physician for follow-up.   Prior to discharge the patient is doing well, has ambulated and is eating his lunch.   DISCHARGE MEDICATIONS: 1.  Lantus 12 units subQ once a day at bedtime.  2.  Insulin aspart 3 units subQ 3 times a day prior to meals.   DISCHARGE INSTRUCTIONS:  Carbohydrate-controlled diet, regular consistency.  Activity as tolerated.  Follow up with primary care physician in 1 or 2 weeks.  He will also check his blood sugars 4 times a day prior to meals and at bedtime, keep a log and take to his doctor's office.    Time spent on day of discharge in discharge activity was 35 minutes.    ____________________________ Molinda BailiffSrikar R. Loula Marcella, MD srs:ea D: 01/29/2013 15:44:44 ET T: 01/30/2013 02:25:44 ET JOB#: [id number]  cc: Wardell HeathSrikar R. Evagelia Knack, MD, <Dictator> Orie FishermanSRIKAR R Trayon Krantz MD ELECTRONICALLY SIGNED 02/07/2013 23:40

## 2014-11-11 NOTE — H&P (Signed)
PATIENT NAME:  Trevor Brown, Trevor Brown MR#:  [id number] DATE OF BIRTH:  [birth year]  DATE OF ADMISSION:  01/29/2013  PRIMARY CARE PHYSICIAN:  None.   REFERRING PHYSICIAN:  Dr. Rometta Emery.   CHIEF COMPLAINT:  High blood sugars.   HISTORY OF PRESENT ILLNESS:  Mr. Radloff is a 24 year old male who was recently diagnosed with diabetes mellitus type 1 in September 2013 and was discharged home with insulin.  The patient states ran out of insulin 3 weeks back, that is found to have high blood sugars.  Has been having polyuria, polydipsia, severe generalized weakness.  Last night started to have abdominal pain.  Considering this, called the EMS and was brought to the Emergency Department.  The patient states did not have a ride or money to buy insulin.  Also has been noncompliant with the diet as well.  Initial blood sugars were 1115.  The patient received 2 liters of IV fluids.  IV insulin was given with blood sugars showing greater than 600.  Denies having any cough, shortness of breath.  Denies having any diarrhea.   PAST MEDICAL HISTORY:  Diabetes mellitus, insulin-dependent.   PAST SURGICAL HISTORY:  None.   ALLERGIES:  1.  IBUPROFEN.  2.  TRAMADOL.  3.  BEE STINGS.   HOME MEDICATIONS:  Currently not taking any medication for the last three weeks.   SOCIAL HISTORY:  Continues to smoke 1 pack a day.  Denies drinking alcohol or using illicit drugs.  Lives by himself.   FAMILY HISTORY:  Strong family history of diabetes mellitus.   REVIEW OF SYSTEMS:  CONSTITUTIONAL:  Severe generalized weakness.  EYES:  Blurry vision.  EARS, NOSE, THROAT:  No change in hearing.  RESPIRATORY:  Has mild cough.  No shortness of breath.  CARDIOVASCULAR:  No chest pain, palpitations.  GASTROINTESTINAL:  Mild nausea, abdominal pain.  GENITOURINARY:  Polyuria.  No dysuria or hematuria.  ENDOCRINE:  Polyuria, polydipsia.  SKIN:  No rash or lesions.  MUSCULOSKELETAL:  No joint pains and aches.   NEUROLOGIC:  No weakness  or numbness in any part of the body.  PSYCHIATRIC:  Denies any depression, however seems to be somewhat .   PHYSICAL EXAMINATION: GENERAL:  This is a well-built, well-nourished, age-appropriate male lying down in the bed, not in distress.  VITAL SIGNS:  Temperature 98.6, pulse 85, blood pressure 123/75, respiratory rate of 18, oxygen saturations 98% on room air.  HEENT:  Head normocephalic, atraumatic.  Eyes, no sclerae icterus.  Conjunctivae normal.  Pupils equal and react to light.  Mucous membranes dry.  No pharyngeal erythema.  NECK:  Supple.  No lymphadenopathy.  No JVD.  No carotid bruit.  CHEST:  Has no focal tenderness.  LUNGS:  Bilateral clear to auscultation.  HEART:  S1, S2 regular.  No murmurs are heard.  No pedal edema.  Pulses 2+.  ABDOMEN:  Bowel sounds plus.  Soft, nontender, nondistended.  No hepatosplenomegaly.  SKIN:  No rash or lesions.  MUSCULOSKELETAL:  Good range of motion in all the extremities.  NEUROLOGIC:  The patient is alert, oriented to place, person and time.  Cranial nerves II through XII intact.  Motor 5 by 5 in upper and lower extremities.  No sensory deficits.   LABORATORY DATA:  CMP, glucose 1115, sodium 119, alk phos of 348, AST 79.  CBC, WBC of 11.4.  The rest of all the values are within normal limits.   ASSESSMENT AND PLAN:  Mr. Both is a 24 year old male  comes to the Emergency Department with high blood sugars.  1.  Hyperosmolar non-ketosis secondary to noncompliance.  Give another 2 liters of IV fluids.  Start the patient on insulin drip.  Continue to monitor Accu-Cheks q. 1 hour.  Correct the electrolytes including potassium, magnesium, phosphorus.  Correct them as needed.  2.  Diabetes mellitus, poorly controlled.  We will obtain hemoglobin A1c we will update the case management consult for medication as it stands.  3.  Hyponatremia secondary to hypovolemic and  hyponatremia.  Continue with IV fluids and follow up.  4.  Tobacco use.  Counseled with  the patient.  5.  Elevated LFTs.  The patient has chronic elevation.  Has normal total bilirubin.  The patient had ultrasound of the abdomen during the last admission.  It showed no cholelithiasis or any evidence of acute cholecystitis.  6.  Keep the patient on DVT prophylaxis with Lovenox.   TIME SPENT:  50 minutes in history, physical examination, documentation, talking to the patient and counseling.     ____________________________ Monica Becton, MD pv:ea D: 01/29/2013 03:35:02 ET T: 01/29/2013 04:51:57 ET JOB#: [id number]  cc: Monica Becton, MD, <Dictator> Grier Mitts Roselie Cirigliano MD ELECTRONICALLY SIGNED 02/03/2013 0:21

## 2014-11-12 NOTE — H&P (Signed)
PATIENT NAME:  Trevor DarlingBAILEY, Trevor Brown MR#:  [id number] DATE OF BIRTH:  03-31-1991  DATE OF ADMISSION:  04/05/2014  REFERRING PHYSICIAN: Dr. Inocencio HomesGayle.   PRIMARY CARE PHYSICIAN: [facility].   CHIEF COMPLAINT: Sore throat.   HISTORY OF PRESENT ILLNESS:  A 24 year old Caucasian gentleman with history of type 1 diabetes, polysubstance abuse, presenting with sore throat. Describes 1 day duration of sore throat and was just soreness without radiation. Intensity is 7 out of 10 and worsened with eating or talking. Had associated odynophagia. Denies any dysphagia. Has subjective fevers, chills. Denies any shortness of breath or further symptomatology. Work-up in the Emergency Department revealed a 13 mm left peritonsillar abscess. The case was discussed with ears, nose and throat in the Emergency Department, given the size likely not able to drain recommend antibiotic coverage.   REVIEW OF SYSTEMS:  CONSTITUTIONAL: Positive for fevers, chills. Denies fatigue, weakness.  EYES: Denies blurry vision, double vision or eye pain.  EARS, NOSE, THROAT: Positive for sore throat and odynophagia as mentioned above. Denies ear pain or hearing loss.  RESPIRATORY: Denies cough, wheeze, shortness of breath.  CARDIOVASCULAR: Denies chest pain palpitations, edema.  GASTROINTESTINAL: Denies nausea, vomiting, diarrhea and abdominal pain.  GENITOURINARY: Denies dysuria, hematuria.  ENDOCRINE: Denies nocturia or thyroid problems.   HEMATOLOGIC/LYMPHATIC: Denies easy bruising or bleeding.   SKIN: Denies rash or lesion.  MUSCULOSKELETAL: Denies pain in the neck, back, shoulders, knees, hips or any arthritic symptoms.   NEUROLOGIC: Denies paralysis or paresthesias.  PSYCHIATRIC: Denies anxiety or depressive symptoms.   Otherwise full review of systems as reviewed by me is negative.     PAST MEDICAL HISTORY: Type 1 diabetes, originally diagnosed around age 24 has had issues with DKA in the past. Also history of polysubstance  abuse, including cocaine, marijuana.   SOCIAL HISTORY: Positive for tobacco use. Denies any alcohol. Denies any current drug use.   FAMILY HISTORY: Positive for diabetes.   ALLERGIES: IBUPROFEN, TRAMADOL,  BEE STINGS.   HOME MEDICATIONS: Include Lantus 12 units subcutaneous daily, NovoLog 4 units subcutaneous before meals.   PHYSICAL EXAMINATION:  VITAL SIGNS: Temperature 99.3, heart rate 92, respirations 20, blood pressure 128/84, saturating 95% on room air. Weight 54.4 kg.  ASSESSMENT AND PLAN: Well-nourished, well-developed, Caucasian gentleman, currently in minimal distress secondary to pain.  HEAD: Normocephalic, atraumatic.  EYES: Pupils equal, round and reactive to light.  Extraocular muscles intact. No scleral icterus.  MOUTH: Dry mucosal membranes. Dentition is intact. He has difficulty opening his mouth which makes examination difficult. However, no appreciable abscess noted on limited examination.  NECK: Supple. No thyromegaly. No nodules. No JVD.  PULMONARY: Clear to auscultation bilaterally without wheezes, rubs, or rhonchi. No accessory muscle use. Good respiratory effort.  CHEST: Nontender to palpation.  CARDIOVASCULAR: S1, S2, regular rate and rhythm. No murmurs, rubs, or gallops. No edema. Pedal pulses 2+ bilaterally.  GASTROINTESTINAL: Soft, nontender, nondistended. No masses. Positive bowel sounds. No hepatosplenomegaly.  MUSCULOSKELETAL: No swelling, clubbing, or edema. Range of motion full in all extremities.  NEUROLOGIC: Cranial nerves II through XII are intact. No gross focal neurological deficits. Sensation intact. Reflexes intact.  SKIN: No ulceration, lesions, rash, cyanosis. Skin warm, dry. Turgor intact. PSYCHIATRIC: Mood and affect within normal limits. The patient is alert and oriented x 3 Insight and judgment intact.   LABORATORY DATA: Had a CT of the neck performed revealing 13 mm left peritonsillar abscess with cervical adenopathy. Remainder of laboratory  data: Sodium 133, potassium 3.9, chloride 98, bicarbonate  26, BUN 17, creatinine 0.84, glucose 426. LFTs: Alkaline phosphatase of 196, otherwise within normal limits. WBC 13.7, hemoglobin 14.6,  ASSESSMENT AND PLAN: A 24 year old gentleman with history of type 1 diabetes presented with sore throat, found to have a small left peritonsillar abscess.  1. Left peritonsillar abscess. The case was discussed ears, nose and throat in the Emergency Department and they have been consulted.  They recommended clindamycin for antibiotic coverage. No airway involvement at this time. No indication for Decadron and we will see the patient in the morning and see if he is amenable to drainage.  2. Type 1 diabetes. Continue with basal dose of Lantus as well addition of insulin sliding scale with q. 6 hour Accu-Cheks.  3. Venous thromboembolic prophylaxis with sequential compression devices.    CODE STATUS: The patient is a full code.   TIME SPENT: 45 minutes.      ____________________________ Cletis Athens. Gracious Renken, MD dkh:JT D: 04/05/2014 22:34:10 ET T: 04/05/2014 23:01:40 ET JOB#: [id number]  cc: Cletis Athens. Sadat Sliwa, MD, <Dictator> Kele Withem Synetta Shadow MD ELECTRONICALLY SIGNED 04/13/2014 20:31

## 2014-11-12 NOTE — Consult Note (Signed)
PATIENT NAME:  Trevor DarlingBAILEY, Trevor Brown MR#:  [id number] DATE OF BIRTH:  [birth year]  DATE OF CONSULTATION:  04/06/2014  REFERRING PHYSICIAN:   CONSULTING PHYSICIAN:  Kyung Ruddreighton C. Manya Balash, MD  REASON FOR CONSULTATION: Peritonsillar abscess.    CONSULTING PHYSICIAN:  Dr. Clint GuyHower.    HISTORY OF PRESENT ILLNESS: The patient is a 24 year old male who was admitted from the Emergency Room on 04/05/2014. He presented to the Emergency Room with a 2 day history of a sore throat and was found to have uncontrolled diabetes with sugar levels in the 400s. He also underwent a CT scan which revealed a 1.3 mm left peritonsillar abscess. The patient because of the elevated glucose and tachypnea as well as a concern for DKA was admitted by the Emergency Room and presented today for evaluation of the patient. The patient reports that his pain is improved this morning although the morphine is only helping his pain temporarily. He is tolerating his own secretions. He is tolerating a soft diet. He denies any respiratory issues. He continues to have a left-sided throat pain.   PAST MEDICAL HISTORY: Significant for type 1 diabetes with issues with DKA and multiple admissions secondary to that. The patient also has a history of substance abuse.    SOCIAL HISTORY:  Cocaine and marijuana use and tobacco use. Denies any alcohol.   FAMILY HISTORY: Significant for diabetes.   ALLERGIES: IBUPROFEN, TRAMADOL, AND BEES.    CURRENT MEDICATIONS: Include clindamycin, Tylenol, Cepacol lozenges, Levemir, Lantus, p.r.n., morphine, and Zofran.   PHYSICAL EXAMINATION:  VITAL SIGNS: Temperature is 98.3, pulse is 73, respirations 20, blood pressure 121/67, pulse oximetry is 95%.  GENERAL: He is a well-nourished, well-developed male in no acute distress.  He is sitting upright in bed, tolerating his lunch. He points to the left neck and reports that he has intermittent pain with swallowing there.  EARS:  EACs are clear.  NOSE: Clear anteriorly. No  mucopus or polyps.  ORAL CAVITY: Oropharynx reveals tonsillar hypertrophy and erythema bilaterally. Uvula is midline. The posterior oropharyngeal wall is visualized. Airway is widely patent. The patient is tolerating his own secretions. He does have a slightly enlarged left tonsil compared to the right. Palpation reveals some vague fullness there but no obvious fluctuance.  Floor of mouth is clear. NECK: Supple. He has some bilateral shotty lymphadenopathy.   CT scan is reviewed which reveals a central fluid collection of left tonsil, within the left tonsil of  13 mm, no cervical lymphadenopathy, the right tonsil enlarged but no fluid collection.   IMPRESSION: Uncontrolled diabetes and left small peritonsillar abscess.   PLAN: Given the patient's ability to tolerate secretions, small nature of the fluid collection, as well as his uncontrolled sugar levels, I discussed my findings with the patient and after discussion would recommend holding off on drainage at this point. Primary care has further note reported that they are going to begin Decadron and I will defer to them regarding this. I would recommend close evaluation of his sugar levels at that point.  I would continue the IV clindamycin and then switch over to p.o. once more therapeutic improvement has occurred. I would like to see him back in 1 week. The patient does report a similar history of this before in the past and it would be reasonable to consider tonsillectomy in the future because of this once this has calmed down.  I would be happy to assist in any other and way please contact us with any further questions or  concerns.    ____________________________ Kyung Rudd, MD ccv:bu D: 04/06/2014 12:45:01 ET T: 04/06/2014 13:09:45 ET JOB#: [id number]  cc: Kyung Rudd, MD, <Dictator> Kyung Rudd MD ELECTRONICALLY SIGNED 04/17/2014 9:48

## 2014-11-12 NOTE — Discharge Summary (Signed)
PATIENT NAME:  Trevor Brown, Trevor Brown DATE OF BIRTH:  07-08-1991  DATE OF ADMISSION:  04/05/2014 DATE OF DISCHARGE:  04/07/2014  ADMITTING PHYSICIAN:  Trevor Ranavid Hower, MD   DISCHARGING PHYSICIAN: Trevor Baasadhika Emerald Shor, MD   PRIMARY CARE PHYSICIAN:  None.   CONSULTATIONS IN THE HOSPITAL: ENT consultation by Trevor Facereighton Vaught, MD   DISCHARGE DIAGNOSES: 1.  Left peritonsillar abscess.  2.  Bilateral tonsillar hypertrophy and left tonsillitis.  3. Type 1 diabetes mellitus.   DISCHARGE HOME MEDICATIONS:  1.  Zofran 4 mg 3 times a day as needed for nausea and vomiting.  2.  NovoLog FlexPen 4 units 4 times a day prior to meals.   3.  Lantus 18 units subcutaneously once a day.  4.  Percocet 5/325 mg 1 tablet q. 6 hours p.r.n. for pain.  5.  Topical 2% viscous lidocaine solution 5 mL every 3 hours as needed for sore throat.  6.  Clindamycin 300 mg p.o. q. 8 hours for 7 days.  7.  Decadron 4 mg p.o. b.i.d. for 5 days.   DISCHARGE DIET: ADA 1800 calorie diet.   DISCHARGE ACTIVITY: As tolerated.    FOLLOWUP INSTRUCTIONS: 1.  Endocrinology follow-up in 2 weeks.  2.  ENT follow-up in 1 week.   LABORATORY DATA AND IMAGING STUDIES PRIOR TO DISCHARGE: Urinalysis negative for any infection. WBC 12.5, hemoglobin 13.1, hematocrit 38.7, platelet count 157,000. Sodium 137, potassium 3.8, chloride 105, bicarbonate 25, BUN 13, creatinine 0.64, glucose 257, calcium 8.1, HbA1c is 15.0. Blood cultures are negative. Throat cultures are pending.   BRIEF HOSPITAL COURSE: Trevor Brown is a 24 year old Caucasian male with past medical history significant for type 1 diabetes mellitus on insulin, presents to the hospital secondary to  difficulty swallowing and sore throat, noted to have a left peritonsillar abscess.  1.  Left peritonsillar abscess.  Seen by ENT. No nasal drainage. Improved with antibiotics and steroids.  Careful with the steroids especially with his type 1 diabetes mellitus. After steroids and  antibiotics, symptoms improved significantly, so he is being discharged home.  He is able to eat regular diet. The patient does have significant tonsillar hypertrophy and, once he is recovered from this infection, Dr. Andee Brown recommended tonsillectomy in the near future. He will follow up with ENT within 1 week. He is being discharged on p.o. Decadron  and p.o. clindamycin.  2.  Type 1 diabetes mellitus. Sugars slightly elevated in the hospital secondary to being on Decadron. Followed with Trevor Brown in the past. His Lantus is being increased to 18 units and HbA1c is up to 15 at this time. So, he is being discharged on the Lantus and also NovoLog and then will follow up with Trevor Brown as an outpatient.   DISCHARGE CONDITION: Stable.   DISCHARGE DISPOSITION: Home.   TIME SPENT ON DISCHARGE: Is 45 minutes.    ____________________________ Trevor Baasadhika Kameren Baade, MD rk:lr D: 04/07/2014 13:43:28 ET T: 04/07/2014 14:50:54 ET JOB#: [id number]  cc: Trevor Baasadhika Stella Encarnacion, MD, <Dictator> A. Wendall MolaMelissa Solum, MD Trevor Ruddreighton C. Vaught, MD Trevor BaasADHIKA Caoimhe Damron MD ELECTRONICALLY SIGNED 04/21/2014 9:50

## 2014-11-12 NOTE — H&P (Signed)
PATIENT NAME:  Trevor Brown, Trevor Brown MR#:  [id number] DATE OF BIRTH:  [birth year]  DATE OF ADMISSION:  12/10/2013  REASON FOR ADMISSION: DKA.   PRIMARY CARE PHYSICIAN: [facility], walk-in clinics.   REFERRING PHYSICIAN: Eartha Inch. York Cerise, MD  HISTORY OF PRESENT ILLNESS: This is a 24 year old gentleman who was recently admitted here in July 2014 with the same diagnosis. He comes today with a history of 3 days of not using his insulin because he ran out and also because he has been using drugs and not taking care of himself. The patient states that yesterday he was his normal self, but this morning, he started having a significant headache, feeling really weak, dizzy, lightheaded and started vomiting. The patient feels really nauseous. He vomited 3 times. The vomit was just gastric content. He states that he has not eaten anything since last night and does not feel good at all. He is very quiet, very sleepy, not giving much information. He feels really tired, and he answers questions with yes and no answers and very short answers. The patient has been urinating a lot, so has polyuria, polydipsia and significant weakness. The patient was seen and evaluated in the Emergency Department. He had already 2 liters of IV fluids, and insulin drip has been ordered. The patient's blood sugars initially were 446, his anion gap was 22. The patient looks severely ill. He looks red from sun exposure. His urinalysis is overall normal. There is no fever at this moment. His pH is only 7.15. The patient remains stable with high blood pressures, likely due to the use of cocaine and is still positive on his urinalysis. The patient states that he has not done cocaine for over a week, although again, his UDS is positive. The patient is admitted for treatment of DKA. He denies having any significant shortness of breath, chest pain, diarrhea, fevers.   PAST MEDICAL HISTORY:  1. 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   2. Multiple episodes of DKA.  3. Tobacco abuse.  4. Polysubstance abuse, especially cocaine.   PAST SURGICAL HISTORY: None.   ALLERGIES: IBUPROFEN, TRAMADOL AND BEE STINGS.   HOME MEDICATIONS: The patient states that he has been taking insulin at home. He does not know the names of the insulin or the regular doses, but what we were able to obtain from the pharmacy is that he is on NovoLog 3 units 3 times a day and Lantus 12 units at bedtime.   SOCIAL HISTORY: The patient smokes 1 pack of cigarettes a day. He denies any significant drinking. He drinks occasionally. He at first denied any drug use. He says that he does not use marijuana or cocaine, but after confronted with the results, the patient states that the last time he did cocaine was over a week ago. He currently lives with his mom and his dad. He does not work.   FAMILY HISTORY: Positive for diabetes. Positive for coronary artery disease in his grandmother.   REVIEW OF SYSTEMS: A 12-system review of systems is done. The patient is not very cooperative at this moment. He feels very sleepy, but overall:  CONSTITUTIONAL: He denies any fever. Positive fatigue and weakness. No weight loss or weight gain.  EYES: No blurry vision or double vision.  ENT: No difficulty swallowing.  RESPIRATORY: No shortness of breath or cough.  CARDIOVASCULAR: No chest pain or orthopnea. Positive dizziness. Positive occasional palpitations, but no edema.  GASTROINTESTINAL: Positive nausea, positive vomiting. No diarrhea. Positive  mild abdominal pain, epigastric, 2 out of 10 at this moment, nonradiating.   GENITOURINARY: No dysuria or hematuria.  ENDOCRINOLOGY: Positive polyuria, polydipsia and polyphagia. No thyroid problems. HEMATOLOGIC AND LYMPHATIC: No anemia, easy bruising or swelling.  MUSCULOSKELETAL: No neck pain or back pain.  NEUROLOGIC: Positive headache. No CVAs, TIAs or ataxia.  PSYCHIATRIC: No insomnia or depression.   PHYSICAL EXAMINATION:   VITAL SIGNS: Blood pressure 164/69, pulse 72, respirations 20, temperature 97.6, oxygen saturation 97% on room air.  GENERAL: The patient is lethargic, but arousable. His speech is goal-directed, but short sentences and answers yes and no to questions.  HEENT: His pupils are equal, round and reactive. Extraocular movements are intact. Mucosae are dry. Anicteric sclerae. Pink conjunctivae. No oral lesions. No oropharyngeal exudates.  NECK: Supple. No JVD. No thyromegaly. No adenopathy. No carotid bruits.  CARDIOVASCULAR: Regular rate and rhythm. No murmurs, rubs or gallops. No displacement of PMI.  LUNGS: Clear, without any wheezing or crepitus. No use of accessory muscles.  ABDOMEN: Soft. Tender to palpation of epigastric area. No rebound tenderness. No guarding. Bowel sounds are positive. No hepatosplenomegaly.  GENITAL: Deferred.  EXTREMITIES: No edema, cyanosis or clubbing. Pulses +2. Capillary refill less than 3.  SKIN: Dry with sunburn exposure, with erythema at the level of the chest, arms and face. This is just a superficial sunburn. No significant other lesions.  NEUROLOGIC: Cranial nerves II through XII intact. Strength is 5 out of 5 in all 4 extremities. The patient remains a little lethargic, but responsive whenever he is stimulated heavily. Moves 4 extremities. Sensation seems to be appropriate. DTRs +2.  MUSCULOSKELETAL: No joint effusions, joint swelling or inflammation.   RESULTS: Blood sugar is 496, creatinine is 1.23, BUN 19, sodium 133, potassium is 4.4, CO2 is 11, anion gap is 22. His lipase was low at less than 60. Ethanol level was negative. Beta hydroxybutyrate is more than 46. His alkaline phosphatase was 243, other LFTs within normal limits. Troponin was negative. Cocaine positive on UDS. White count is 12.7, hemoglobin 16, platelet count is 211. Urinalysis negative for urinary tract infection, no white blood cells, no red blood cells, glucose is more than 500, +2 ketones. ABG:  pH of 7.1, pCO2 of 30, pO2 of less than 43, FiO2 was 21% and HCO3 was 10. His lactic acid was 1.8. Chest x-ray showed no evidence of infiltrates, overall normal.   ASSESSMENT AND PLAN: This is a 24 year old gentleman with history of diabetic ketoacidosis, admitted for the treatment of this condition. He was diagnosed with diabetes type 1 at the age 24.   1. Diabetic ketoacidosis. The patient comes with a history of 3 days of stopping insulin and using cocaine recently. The patient does not have any other significant triggers. No signs of infections at this moment. The patient looks severely dehydrated. DKA protocol started.  IV fluids 2 liters of NS given, now 200 mL of NS. Replace potassium as needed. Follow up BMPs every 4 hours for now to evaluate anion gap and glucose. Insulin drip at 0.1 units/kg, continue until anion gap is closed, then switch to home dose of Lantus and NovoLog pre-meals. Once glucose below 250, change fluids to D5 NS. Check magnesium, check potassium and replace as needed. Check phosphorus and replete as needed. The patient admitted to the Intensive Care Unit for treatment of DKA.  2. Cocaine use. The patient has significant history of drug abuse. Dentition is not very good, for which I think he is also using  other drugs, like amphetamines, so likely polysubstance. Avoid beta blockers. Continue to monitor vital signs.  3. Hypertension. This is likely secondary to the use of cocaine. Continue to monitor closely. Avoid use of beta blockers.  4. Tobacco abuse. The patient counseled. Tobacco smoking cessation given to the patient for 4 minutes. The patient states that he does not want anything to quit smoking at this moment. Also, that was included with drug cessation. The patient states that he does not use cocaine regularly, but first he said he does not use it at all, and then he said that he used some last week.  5. Deep venous thrombosis prophylaxis with Lovenox.  6.  Gastrointestinal prophylaxis with Protonix.   CRITICAL CARE TIME: I spent about 45 minutes with this patient, who is critically ill with DKA.   ____________________________ Felipa Furnace, MD rsg:lb D: 12/10/2013 08:07:07 ET T: 12/10/2013 08:43:07 ET JOB#: [id number]  cc: Felipa Furnace, MD, <Dictator> Tiegan Terpstra Juanda Chance MD ELECTRONICALLY SIGNED 12/10/2013 22:41

## 2014-11-12 NOTE — Consult Note (Signed)
Brief Consult Note: Diagnosis: Uncontrolled DM, Left small peritonsillar abcess.   Patient was seen by consultant.   Consult note dictated.   Recommend further assessment or treatment.   Comments: 24 y.o. male with history of unctontrolled DM as well as 2 day history of sore throat and found to have left peritonsillar abcess.  Admitted overnight and begun on IV clindamycin.  Decadron not used secondary to very high glucose in 400's.  Patient reports improved pain today.  Tolerating diet.  Gen- NAD, sitting upright in bed and eating lunch Ears- Clear EACs Nose- clear anteriorly OC/OP- hypertrophic and enlarged tonsils bilaterally, uvula midline, posterior oropharyngeal wall visualized and airway widely patent.  Palpation reveals vague fullness of left tonsil versus right, but no fluctuance Neck- bilateral shotty lymphadenopathy  Impression:  Left Peritonsillar abcess and Tonsillitis  Plan: 1)  Continue IV abx 2)  Defer to medicine regarding risk/benefit of decadron regarding patient's DM and elevated glucose.  Patient's will have improved symptommatic control with steroids 3)  Rec. oral pain medication as patient tolerating diet well and reports pain only temporarily controlled with IV medications 4)  Would hold off on drainage at this time given the examination as well as size of abcess. 5)  Will like to see patient next week for repeat evaluation.  Electronic Signatures: Raziah Funnell, Rayfield Citizenreighton Charles (MD)  (Signed 16-Sep-15 12:38)  Authored: Brief Consult Note   Last Updated: 16-Sep-15 12:38 by Flossie DibbleVaught, Jahlia Omura Charles (MD)

## 2014-11-12 NOTE — Discharge Summary (Signed)
PATIENT NAME:  Trevor DarlingBAILEY, Danh B MR#:  [id number] DATE OF BIRTH:  13-Jun-1991  DATE OF ADMISSION:  12/10/2013 DATE OF DISCHARGE:  12/12/2013  REASON FOR ADMISSION: Abdominal pain due to DKA.   DISPOSITION: Home.   FOLLOWUP: Followup with [facility].   MEDICATIONS AT DISCHARGE: Lantus 12 units once a day at night, NovoLog FlexPen 4 units subcutaneously 3 times a day before meals, acetaminophen with hydrocodone 325/5 mg every 6 hours as needed for pain aches and headaches, Klor-Con 10 mEq take 1 tablet once a day. Prescription for glucometer, lancets and blood sugar strips given to the patient.   HOSPITAL COURSE: This is a 24 year old gentleman who has history of diabetes, was admitted back in 01/2013 with a diagnosis of DKA. The patient comes in on 12/10/2013 with the same complaints of being sick in DKA with abdominal pain, nausea, vomiting. The patient had 3 days of not using insulin because he ran out. The patient also has been using drugs like cocaine. He does not visit his doctor. He always goes to the urgent care to get refills on his medications, but he states that he does not have a car or transportation.   The patient smokes 2 packs a day of cigarettes and uses cocaine. The patient was given education about the need of getting his insulin to avoid these types of problems and future complications of the diabetes. We do the math and establish with the amount of money that he spends on cocaine and cigarettes, he could afford transportation to see his doctor and the patient agrees with this.   The patient was significantly ill. He had several episodes of vomiting before admission. His blood sugars initially were 446 with an anion gap of 22. His pH was 7.15 and the patient was severely dehydrated.   He was given IV fluids. DKA protocol was started with IV insulin.   The patient was admitted to the critical care unit.   As per problems:  1. DKA. The patient again was admitted with blood  sugars above 400. He has anion gap of 22. DKA protocol was started, IV insulin until anion gap closed. After that, the patient was kept on IV fluids. Insulating was converted into Lantus and pre-meal NovoLog and the patient tolerated this very well. The patient had still very high blood sugars for what we decided to keep him another a day. The patient complaining of headaches and cramps, but his abdominal pain resolved. He had really high blood sugars at night with low blood sugars in the mornings. His blood sugars were around 90 and 80 first thing in the morning and up to 300s in the evening.  His insulin was increased on the pre-meal NovoLog FlexPen, but not on the Lantus overnight, as the patient continued to have low blood sugars. His potassium was low and needed to be replaced IV on multiple occasions. His magnesium was low as well at 1.6. All this was repleted prior to the patient going home.  2. As far as the use of cocaine, the patient has been educated. As far as the use of tobacco, the patient was counseled as well. He had elevation of high blood pressure secondary to the use of cocaine, but after cocaine was out of his system, blood pressure normalized.  3. As far as his cramping, it was secondary to hypokalemia and hypomagnesemia. The patient was given prescription for potassium at discharge.   His white blood count was 12.7 on admission, but  no signs of infection.   The patient did well during this hospitalization and he is discharged in good condition.   TIME SPENT: I spent about 35 minutes discharging this patient.    ____________________________ Felipa Furnace, MD rsg:es D: 12/15/2013 07:25:35 ET T: 12/15/2013 08:12:32 ET JOB#: [id number]  cc: Felipa Furnace, MD, <Dictator> Sloan Galentine Juanda Chance MD ELECTRONICALLY SIGNED 12/25/2013 11:02

## 2014-11-20 DIAGNOSIS — B1710 Acute hepatitis C without hepatic coma: Secondary | ICD-10-CM

## 2014-11-20 HISTORY — DX: Acute hepatitis C without hepatic coma: B17.10

## 2014-12-09 ENCOUNTER — Inpatient Hospital Stay
Admission: EM | Admit: 2014-12-09 | Discharge: 2014-12-13 | DRG: 638 | Disposition: A | Discharge status: Home / Self Care | Payer: Self-pay | Attending: Internal Medicine | Admitting: Internal Medicine

## 2014-12-09 ENCOUNTER — Emergency Department: Payer: Self-pay

## 2014-12-09 ENCOUNTER — Encounter: Payer: Self-pay | Admitting: *Deleted

## 2014-12-09 DIAGNOSIS — K759 Inflammatory liver disease, unspecified: Secondary | ICD-10-CM

## 2014-12-09 DIAGNOSIS — Z833 Family history of diabetes mellitus: Secondary | ICD-10-CM

## 2014-12-09 DIAGNOSIS — R945 Abnormal results of liver function studies: Secondary | ICD-10-CM

## 2014-12-09 DIAGNOSIS — Z8489 Family history of other specified conditions: Secondary | ICD-10-CM

## 2014-12-09 DIAGNOSIS — G629 Polyneuropathy, unspecified: Secondary | ICD-10-CM | POA: Diagnosis present

## 2014-12-09 DIAGNOSIS — B1710 Acute hepatitis C without hepatic coma: Secondary | ICD-10-CM | POA: Diagnosis present

## 2014-12-09 DIAGNOSIS — E1165 Type 2 diabetes mellitus with hyperglycemia: Secondary | ICD-10-CM | POA: Diagnosis present

## 2014-12-09 DIAGNOSIS — IMO0002 Reserved for concepts with insufficient information to code with codable children: Secondary | ICD-10-CM | POA: Diagnosis present

## 2014-12-09 DIAGNOSIS — Z794 Long term (current) use of insulin: Secondary | ICD-10-CM

## 2014-12-09 DIAGNOSIS — K029 Dental caries, unspecified: Secondary | ICD-10-CM | POA: Diagnosis present

## 2014-12-09 DIAGNOSIS — R7989 Other specified abnormal findings of blood chemistry: Secondary | ICD-10-CM

## 2014-12-09 DIAGNOSIS — F1410 Cocaine abuse, uncomplicated: Secondary | ICD-10-CM | POA: Diagnosis present

## 2014-12-09 DIAGNOSIS — L739 Follicular disorder, unspecified: Secondary | ICD-10-CM | POA: Diagnosis present

## 2014-12-09 DIAGNOSIS — F320 Major depressive disorder, single episode, mild: Secondary | ICD-10-CM | POA: Insufficient documentation

## 2014-12-09 DIAGNOSIS — L0291 Cutaneous abscess, unspecified: Secondary | ICD-10-CM

## 2014-12-09 DIAGNOSIS — R748 Abnormal levels of other serum enzymes: Secondary | ICD-10-CM

## 2014-12-09 DIAGNOSIS — B1920 Unspecified viral hepatitis C without hepatic coma: Secondary | ICD-10-CM

## 2014-12-09 DIAGNOSIS — Z5900 Homelessness unspecified: Secondary | ICD-10-CM

## 2014-12-09 DIAGNOSIS — Z880 Allergy status to penicillin: Secondary | ICD-10-CM

## 2014-12-09 DIAGNOSIS — F17210 Nicotine dependence, cigarettes, uncomplicated: Secondary | ICD-10-CM | POA: Diagnosis present

## 2014-12-09 DIAGNOSIS — Z885 Allergy status to narcotic agent status: Secondary | ICD-10-CM

## 2014-12-09 DIAGNOSIS — F329 Major depressive disorder, single episode, unspecified: Secondary | ICD-10-CM

## 2014-12-09 DIAGNOSIS — E1065 Type 1 diabetes mellitus with hyperglycemia: Principal | ICD-10-CM | POA: Diagnosis present

## 2014-12-09 DIAGNOSIS — R740 Nonspecific elevation of levels of transaminase and lactic acid dehydrogenase [LDH]: Secondary | ICD-10-CM | POA: Diagnosis present

## 2014-12-09 DIAGNOSIS — Z9114 Patient's other noncompliance with medication regimen: Secondary | ICD-10-CM | POA: Diagnosis present

## 2014-12-09 DIAGNOSIS — Z91030 Bee allergy status: Secondary | ICD-10-CM

## 2014-12-09 DIAGNOSIS — R739 Hyperglycemia, unspecified: Secondary | ICD-10-CM

## 2014-12-09 DIAGNOSIS — R7401 Elevation of levels of liver transaminase levels: Secondary | ICD-10-CM | POA: Diagnosis present

## 2014-12-09 HISTORY — DX: Type 2 diabetes mellitus without complications: E11.9

## 2014-12-09 LAB — COMPREHENSIVE METABOLIC PANEL
ALT: 931 U/L — ABNORMAL HIGH (ref 17–63)
AST: 696 U/L — ABNORMAL HIGH (ref 15–41)
Albumin: 4 g/dL (ref 3.5–5.0)
Alkaline Phosphatase: 609 U/L — ABNORMAL HIGH (ref 38–126)
Anion gap: 14 (ref 5–15)
BUN: 15 mg/dL (ref 6–20)
CO2: 25 mmol/L (ref 22–32)
Calcium: 9 mg/dL (ref 8.9–10.3)
Chloride: 95 mmol/L — ABNORMAL LOW (ref 101–111)
Creatinine, Ser: 0.78 mg/dL (ref 0.61–1.24)
GFR calc Af Amer: >60 mL/min (ref >60)
GFR calc non Af Amer: >60 mL/min (ref >60)
Glucose, Bld: 544 mg/dL (ref 65–99)
Potassium: 4.1 mmol/L (ref 3.5–5.1)
Sodium: 134 mmol/L — ABNORMAL LOW (ref 135–145)
Total Bilirubin: 1.3 mg/dL — ABNORMAL HIGH (ref 0.3–1.2)
Total Protein: 7.4 g/dL (ref 6.5–8.1)

## 2014-12-09 LAB — CBC
HCT: 43.7 % (ref 40.0–52.0)
Hemoglobin: 14.8 g/dL (ref 13.0–18.0)
MCH: 30.1 pg (ref 26.0–34.0)
MCHC: 33.8 g/dL (ref 32.0–36.0)
MCV: 89.2 fL (ref 80.0–100.0)
Platelets: 205 10*3/uL (ref 150–440)
RBC: 4.9 MIL/uL (ref 4.40–5.90)
RDW: 12.2 % (ref 11.5–14.5)
WBC: 8.3 10*3/uL (ref 3.8–10.6)

## 2014-12-09 LAB — URINALYSIS COMPLETE WITH MICROSCOPIC (ARMC ONLY)
Bacteria, UA: NONE SEEN
Bilirubin Urine: NEGATIVE
Glucose, UA: >500 mg/dL — AB
Hgb urine dipstick: NEGATIVE
Leukocytes, UA: NEGATIVE
Nitrite: NEGATIVE
Protein, ur: NEGATIVE mg/dL
RBC / HPF: NONE SEEN RBC/hpf (ref 0–5)
Specific Gravity, Urine: 1.036 — ABNORMAL HIGH (ref 1.005–1.030)
Squamous Epithelial / LPF: NONE SEEN
WBC, UA: NONE SEEN WBC/hpf (ref 0–5)
pH: 6 (ref 5.0–8.0)

## 2014-12-09 LAB — ACETAMINOPHEN LEVEL: Acetaminophen (Tylenol), Serum: <10 ug/mL — ABNORMAL LOW (ref 10–30)

## 2014-12-09 LAB — GLUCOSE, CAPILLARY
Glucose-Capillary: 538 mg/dL — ABNORMAL HIGH (ref 65–99)
Glucose-Capillary: 454 mg/dL — ABNORMAL HIGH (ref 65–99)
Glucose-Capillary: 233 mg/dL — ABNORMAL HIGH (ref 65–99)
Glucose-Capillary: 157 mg/dL — ABNORMAL HIGH (ref 65–99)
Glucose-Capillary: 217 mg/dL — ABNORMAL HIGH (ref 65–99)
Glucose-Capillary: 463 mg/dL — ABNORMAL HIGH (ref 65–99)

## 2014-12-09 LAB — BILIRUBIN, FRACTIONATED(TOT/DIR/INDIR)
Bilirubin, Direct: 0.2 mg/dL (ref 0.1–0.5)
Indirect Bilirubin: 0.5 mg/dL (ref 0.3–0.9)
Total Bilirubin: 0.7 mg/dL (ref 0.3–1.2)

## 2014-12-09 LAB — TSH: TSH: 0.517 u[IU]/mL (ref 0.350–4.500)

## 2014-12-09 LAB — CREATININE, SERUM
Creatinine, Ser: 0.61 mg/dL (ref 0.61–1.24)
GFR calc Af Amer: >60 mL/min (ref >60)
GFR calc non Af Amer: >60 mL/min (ref >60)

## 2014-12-09 LAB — PROTIME-INR
INR: 0.93
Prothrombin Time: 12.7 seconds (ref 11.4–15.0)

## 2014-12-09 LAB — HEMOGLOBIN A1C: Hgb A1c MFr Bld: 14.3 % — ABNORMAL HIGH (ref 4.0–6.0)

## 2014-12-09 MED ORDER — INSULIN ASPART 100 UNIT/ML ~~LOC~~ SOLN
3.0000 [IU] | Freq: Three times a day (TID) | SUBCUTANEOUS | Status: DC
  Administered 2014-12-09 – 2014-12-10 (×4): 3 [IU] via SUBCUTANEOUS
  Filled 2014-12-09 (×4): qty 3

## 2014-12-09 MED ORDER — SODIUM CHLORIDE 0.9 % IV BOLUS (SEPSIS)
1000.0000 mL | Freq: Once | INTRAVENOUS | Status: AC
  Administered 2014-12-09: 1000 mL via INTRAVENOUS

## 2014-12-09 MED ORDER — NYSTATIN 100000 UNIT/ML MT SUSP
5.0000 mL | Freq: Four times a day (QID) | OROMUCOSAL | Status: DC
  Administered 2014-12-09 – 2014-12-13 (×14): 500000 [IU] via ORAL
  Filled 2014-12-09 (×14): qty 5

## 2014-12-09 MED ORDER — SULFAMETHOXAZOLE-TRIMETHOPRIM 800-160 MG PO TABS
1.0000 | ORAL_TABLET | Freq: Two times a day (BID) | ORAL | Status: DC
  Administered 2014-12-09 – 2014-12-10 (×3): 1 via ORAL
  Filled 2014-12-09 (×3): qty 1

## 2014-12-09 MED ORDER — INSULIN ASPART 100 UNIT/ML ~~LOC~~ SOLN
10.0000 [IU] | Freq: Once | SUBCUTANEOUS | Status: AC
  Administered 2014-12-09: 10 [IU] via SUBCUTANEOUS

## 2014-12-09 MED ORDER — ACETAMINOPHEN 325 MG PO TABS
650.0000 mg | ORAL_TABLET | Freq: Four times a day (QID) | ORAL | Status: DC | PRN
  Administered 2014-12-09: 650 mg via ORAL
  Filled 2014-12-09: qty 2

## 2014-12-09 MED ORDER — INSULIN GLARGINE 100 UNIT/ML ~~LOC~~ SOLN
12.0000 [IU] | Freq: Every day | SUBCUTANEOUS | Status: DC
  Administered 2014-12-09: 12 [IU] via SUBCUTANEOUS
  Filled 2014-12-09 (×2): qty 0.12

## 2014-12-09 MED ORDER — IBUPROFEN 600 MG PO TABS
ORAL_TABLET | ORAL | Status: AC
  Filled 2014-12-09: qty 1

## 2014-12-09 MED ORDER — IBUPROFEN 600 MG PO TABS
600.0000 mg | ORAL_TABLET | Freq: Once | ORAL | Status: AC
  Administered 2014-12-09: 600 mg via ORAL

## 2014-12-09 MED ORDER — NICOTINE 21 MG/24HR TD PT24
21.0000 mg | MEDICATED_PATCH | Freq: Every day | TRANSDERMAL | Status: DC
  Administered 2014-12-09 – 2014-12-12 (×4): 21 mg via TRANSDERMAL
  Filled 2014-12-09 (×5): qty 1

## 2014-12-09 MED ORDER — MORPHINE SULFATE 2 MG/ML IJ SOLN
2.0000 mg | Freq: Once | INTRAMUSCULAR | Status: AC
  Administered 2014-12-09: 2 mg via INTRAVENOUS

## 2014-12-09 MED ORDER — OXYCODONE HCL 5 MG PO TABS
5.0000 mg | ORAL_TABLET | Freq: Four times a day (QID) | ORAL | Status: DC | PRN
  Administered 2014-12-09 – 2014-12-10 (×4): 5 mg via ORAL
  Filled 2014-12-09 (×4): qty 1

## 2014-12-09 MED ORDER — INSULIN ASPART 100 UNIT/ML ~~LOC~~ SOLN
SUBCUTANEOUS | Status: AC
  Filled 2014-12-09: qty 10

## 2014-12-09 MED ORDER — INSULIN ASPART 100 UNIT/ML ~~LOC~~ SOLN
0.0000 [IU] | Freq: Three times a day (TID) | SUBCUTANEOUS | Status: DC
  Administered 2014-12-09 – 2014-12-10 (×2): 3 [IU] via SUBCUTANEOUS
  Administered 2014-12-10: 7 [IU] via SUBCUTANEOUS
  Administered 2014-12-10: 100 [IU] via SUBCUTANEOUS
  Administered 2014-12-11 (×2): 5 [IU] via SUBCUTANEOUS
  Administered 2014-12-11: 3 [IU] via SUBCUTANEOUS
  Administered 2014-12-12: 2 [IU] via SUBCUTANEOUS
  Administered 2014-12-13: 5 [IU] via SUBCUTANEOUS
  Filled 2014-12-09: qty 2
  Filled 2014-12-09: qty 3
  Filled 2014-12-09: qty 7
  Filled 2014-12-09 (×2): qty 5
  Filled 2014-12-09: qty 7
  Filled 2014-12-09: qty 2
  Filled 2014-12-09: qty 3
  Filled 2014-12-09: qty 10

## 2014-12-09 MED ORDER — INSULIN ASPART 100 UNIT/ML ~~LOC~~ SOLN
0.0000 [IU] | Freq: Every day | SUBCUTANEOUS | Status: DC
  Administered 2014-12-10 – 2014-12-12 (×2): 5 [IU] via SUBCUTANEOUS
  Filled 2014-12-09 (×2): qty 5

## 2014-12-09 MED ORDER — SODIUM CHLORIDE 0.9 % IV SOLN
INTRAVENOUS | Status: DC
  Administered 2014-12-09 – 2014-12-13 (×8): via INTRAVENOUS

## 2014-12-09 MED ORDER — HEPARIN SODIUM (PORCINE) 5000 UNIT/ML IJ SOLN
5000.0000 [IU] | Freq: Three times a day (TID) | INTRAMUSCULAR | Status: DC
  Administered 2014-12-09 – 2014-12-13 (×11): 5000 [IU] via SUBCUTANEOUS
  Filled 2014-12-09 (×11): qty 1

## 2014-12-09 MED ORDER — SULFAMETHOXAZOLE-TRIMETHOPRIM 800-160 MG PO TABS: 1.0000 | ORAL_TABLET | Freq: Two times a day (BID) | ORAL | Status: DC

## 2014-12-09 MED ORDER — INSULIN GLARGINE 100 UNIT/ML ~~LOC~~ SOLN
12.0000 [IU] | Freq: Every day | SUBCUTANEOUS | Status: DC
  Filled 2014-12-09: qty 0.12

## 2014-12-09 MED ORDER — MORPHINE SULFATE 2 MG/ML IJ SOLN
INTRAMUSCULAR | Status: AC
  Filled 2014-12-09: qty 1

## 2014-12-09 NOTE — ED Notes (Signed)
Pt arrives with hyperglycemia, pt states he is seen in ER frequently but is out of insulin and [facility] "[facility]", pt lives at shelter

## 2014-12-09 NOTE — Discharge Instructions (Signed)
Abscess An abscess (boil or furuncle) is an infected area on or under the skin. This area is filled with yellowish-white fluid (pus) and other material (debris). HOME CARE   Only take medicines as told by your doctor.  If you were given antibiotic medicine, take it as directed. Finish the medicine even if you start to feel better.  If gauze is used, follow your doctor's directions for changing the gauze.  To avoid spreading the infection:  Keep your abscess covered with a bandage.  Wash your hands well.  Do not share personal care items, towels, or whirlpools with others.  Avoid skin contact with others.  Keep your skin and clothes clean around the abscess.  Keep all doctor visits as told. GET HELP RIGHT AWAY IF:   You have more pain, puffiness (swelling), or redness in the wound site.  You have more fluid or blood coming from the wound site.  You have muscle aches, chills, or you feel sick.  You have a fever. MAKE SURE YOU:   Understand these instructions.  Will watch your condition.  Will get help right away if you are not doing well or get worse. Document Released: 12/25/2007 Document Revised: 01/07/2012 Document Reviewed: 09/20/2011 [facility] Patient Information 2015 [employer], [place]. This information is not intended to replace advice given to you by your health care provider. Make sure you discuss any questions you have with your health care provider.  Blood Glucose Monitoring Monitoring your blood glucose (also know as blood sugar) helps you to manage your diabetes. It also helps you and your health care provider monitor your diabetes and determine how well your treatment plan is working. WHY SHOULD YOU MONITOR YOUR BLOOD GLUCOSE?  It can help you understand how food, exercise, and medicine affect your blood glucose.  It allows you to know what your blood glucose is at any given moment. You can quickly tell if you are having low blood glucose (hypoglycemia) or high  blood glucose (hyperglycemia).  It can help you and your health care provider know how to adjust your medicines.  It can help you understand how to manage an illness or adjust medicine for exercise. WHEN SHOULD YOU TEST? Your health care provider will help you decide how often you should check your blood glucose. This may depend on the type of diabetes you have, your diabetes control, or the types of medicines you are taking. Be sure to write down all of your blood glucose readings so that this information can be reviewed with your health care provider. See below for examples of testing times that your health care provider may suggest. Type 1 Diabetes  Test 4 times a day if you are in good control, using an insulin pump, or perform multiple daily injections.  If your diabetes is not well controlled or if you are sick, you may need to monitor more often.  It is a good idea to also monitor:  Before and after exercise.  Between meals and 2 hours after a meal.  Occasionally between 2:00 a.m. and 3:00 a.m. Type 2 Diabetes  It can vary with each person, but generally, if you are on insulin, test 4 times a day.  If you take medicines by mouth (orally), test 2 times a day.  If you are on a controlled diet, test once a day.  If your diabetes is not well controlled or if you are sick, you may need to monitor more often. HOW TO MONITOR YOUR BLOOD GLUCOSE Supplies Needed  Blood glucose meter.  Test strips for your meter. Each meter has its own strips. You must use the strips that go with your own meter.  A pricking needle (lancet).  A device that holds the lancet (lancing device).  A journal or log book to write down your results. Procedure  Wash your hands with soap and water. Alcohol is not preferred.  Prick the side of your finger (not the tip) with the lancet.  Gently milk the finger until a small drop of blood appears.  Follow the instructions that come with your meter for  inserting the test strip, applying blood to the strip, and using your blood glucose meter. Other Areas to Get Blood for Testing Some meters allow you to use other areas of your body (other than your finger) to test your blood. These areas are called alternative sites. The most common alternative sites are:  The forearm.  The thigh.  The back area of the lower leg.  The palm of the hand. The blood flow in these areas is slower. Therefore, the blood glucose values you get may be delayed, and the numbers are different from what you would get from your fingers. Do not use alternative sites if you think you are having hypoglycemia. Your reading will not be accurate. Always use a finger if you are having hypoglycemia. Also, if you cannot feel your lows (hypoglycemia unawareness), always use your fingers for your blood glucose checks. ADDITIONAL TIPS FOR GLUCOSE MONITORING  Do not reuse lancets.  Always carry your supplies with you.  All blood glucose meters have a 24-hour "hotline" number to call if you have questions or need help.  Adjust (calibrate) your blood glucose meter with a control solution after finishing a few boxes of strips. BLOOD GLUCOSE RECORD KEEPING It is a good idea to keep a daily record or log of your blood glucose readings. Most glucose meters, if not all, keep your glucose records stored in the meter. Some meters come with the ability to download your records to your home computer. Keeping a record of your blood glucose readings is especially helpful if you are wanting to look for patterns. Make notes to go along with the blood glucose readings because you might forget what happened at that exact time. Keeping good records helps you and your health care provider to work together to achieve good diabetes management.  Document Released: 07/11/2003 Document Revised: 11/22/2013 Document Reviewed: 11/30/2012 [facility] Patient Information 2015 [place], [place]. This information is not  intended to replace advice given to you by your health care provider. Make sure you discuss any questions you have with your health care provider.

## 2014-12-09 NOTE — Progress Notes (Signed)
Inpatient Diabetes Program Recommendations  AACE/ADA: New Consensus Statement on Inpatient Glycemic Control (2013)  Target Ranges:  Prepandial:   less than 140 mg/dL      Peak postprandial:   less than 180 mg/dL (1-2 hours)      Critically ill patients:  140 - 180 mg/dL   Reason for Visit: Type 1 diabetes  Diabetes history: Type 1 diabetes Outpatient Diabetes medications: none x 2 weeks Current orders for Inpatient glycemic control: Lantus 12 units daily, Novolog 3 units tid with meals, Novolog correction insulin sensitive scale 0-9 units tid with meals and 0-5 units qhs  Spoke with this patient- does understand the importance of eating and taking his medication but currently lacks the resources.  Spoke with social work- working to assist this patient for these resources post discharge. Patient does have the [employer] meter at the bedside and strongly encouraged to connect with [facility] who will help him with strips at an even more reasonable price.   Susette RacerJulie Axton Cihlar, RN, BA, MHA, CDE Diabetes Coordinator Inpatient Diabetes Program  [phone] (Team Pager) [phone] [facility] Office) 12/09/2014 4:10 PM

## 2014-12-09 NOTE — Care Management Note (Signed)
Case Management Note  Patient Details  Name: Trevor Brown MRN: [id number] Date of Birth: [birth year]  Subjective/Objective:   Admitted with hyperglycemia. Reviewed H & P. Met with pt. He is very withdrawn and quiet. He reports he has never been to [facility] because he lost the paperwork. He states he [facility]. Email sent to [facility] to see if I can get him an appointment. He lives in a homeless shelter with his dad. He has no transportation. He states he can get to the [facility] if he is accepted. Pt  confirms he has no glucometer. RNCM provided patient with a new glucometer and strips.  Requested primary nurse order a diabetic consult. Following.   Action/Plan:   Expected Discharge Date:                  Expected Discharge Plan:  Homeless Shelter  In-House Referral:     Discharge planning Services  CM Consult  Post Acute Care Choice:    Choice offered to:     DME Arranged:    DME Agency:     HH Arranged:    HH Agency:     Status of Service:  In process, will continue to follow  Medicare Important Message Given:    Date Medicare IM Given:    Medicare IM give by:    Date Additional Medicare IM Given:    Additional Medicare Important Message give by:     If discussed at [place] of Stay Meetings, dates discussed:    Additional Comments:  Jolly Mango, RN 12/09/2014, 3:36 PM

## 2014-12-09 NOTE — H&P (Addendum)
[facility] [place] [facility]   PATIENT NAME: Trevor Brown    MR#:  [id number]  DATE OF BIRTH:  [birth year]  DATE OF ADMISSION:  12/09/2014  PRIMARY CARE PHYSICIAN: No PCP Per Patient   REQUESTING/REFERRING PHYSICIAN: Dr Cyril LoosenKinner  CHIEF COMPLAINT:   Chief Complaint  Patient presents with  . Hyperglycemia    HISTORY OF PRESENT ILLNESS:  Trevor Brown is a 24 y.o. male with past medical history of uncontrolled type 1 diabetes and polysubstance abuse who presents via EMS for complaints of elevated blood glucose. He states that he did not check his blood sugars, but felt that they were high. His meter is broken and he does not have any test strips. He has not had any insulin for 2 weeks due to running out. [facility].  On presentation to the emergency room his blood sugar is 538, he is not in DKA. Does endorse polyuria and polydipsia. Also, transaminitis is noted on his lab work, and he does report episodic abdominal pain for the past 3 months with no diarrhea nausea or vomiting. Abdominal ultrasound is negative for gallstones. He is being admitted for further evaluation and treatment of hepatitis of unknown etiology.    PAST MEDICAL HISTORY:   Past Medical History  Diagnosis Date  . Diabetes mellitus without complication     PAST SURGICAL HISTORY:  History reviewed. No pertinent past surgical history.  SOCIAL HISTORY:   History  Substance Use Topics  . Smoking status: Current Every Day Smoker -- 1.00 packs/day  . Smokeless tobacco: Not on file  . Alcohol Use: No    FAMILY HISTORY:   Family History  Problem Relation Age of Onset  . Cirrhosis Mother   . Diabetes Mellitus II Maternal Grandmother     DRUG ALLERGIES:   Allergies  Allergen Reactions  . Bee Venom   . Tramadol     REVIEW OF SYSTEMS:  CONSTITUTIONAL: No fever, fatigue or weakness.  EYES: No blurred or  double vision.  EARS, NOSE, AND THROAT: No tinnitus or ear pain. He does endorse dental pain due to caries, sore throat RESPIRATORY: No cough, shortness of breath, wheezing or hemoptysis.  CARDIOVASCULAR: No chest pain, orthopnea, edema.  GASTROINTESTINAL: No nausea, vomiting, diarrhea does have episodic abdominal pain mainly in the area umbilical area no hematochezia or melena GENITOURINARY: No dysuria, hematuria.  ENDOCRINE: No polyuria, nocturia,  HEMATOLOGY: No anemia, easy bruising or bleeding SKIN: Skin lesion on the right buttock MUSCULOSKELETAL: No joint pain or arthritis.   NEUROLOGIC: No numbness, weakness, occasional peripheral neuropathy with tingling of the toes.  PSYCHIATRY: Positive for depression  MEDICATIONS AT HOME:   Prior to Admission medications   Medication Sig Start Date End Date Taking? Authorizing Provider  amoxicillin (AMOXIL) 500 MG capsule Take 1 capsule (500 mg total) by mouth 3 (three) times daily. 06/25/14   Burgess AmorJulie Idol, PA-C  naproxen (NAPROSYN) 500 MG tablet Take 1 tablet (500 mg total) by mouth 2 (two) times daily. 06/25/14   Burgess AmorJulie Idol, PA-C  sulfamethoxazole-trimethoprim (BACTRIM DS,SEPTRA DS) 800-160 MG per tablet Take 1 tablet by mouth 2 (two) times daily. 12/09/14   Jene Everyobert Kinner, MD      VITAL SIGNS:  Blood pressure 127/73, pulse 57, temperature 98.5 F (36.9 C), temperature source Oral, resp. rate 16, height 5\' 6"  (1.676 m), weight 58.968 kg (130 lb), SpO2 97 %.  PHYSICAL EXAMINATION:  GENERAL:  24 y.o.-year-old patient lying in the bed with no acute distress. Thin EYES: Pupils equal, round, reactive to light and accommodation. No scleral icterus. Extraocular muscles intact.  HEENT: Head atraumatic, normocephalic. Oral mucous membranes are dry, poor dentition, crusting and erythema at the corners of the mouth  NECK:  Supple, no jugular venous distention. No thyroid enlargement, no tenderness.  LUNGS: Normal breath sounds bilaterally, no wheezing,  rales,rhonchi or crepitation. No use of accessory muscles of respiration.  CARDIOVASCULAR: S1, S2 normal. No murmurs, rubs, or gallops.  ABDOMEN: Soft, nontender, nondistended. Bowel sounds present. He does have a soft protrusion to the right of the umbilicus which is slightly tender to palpation, mobile, states that this is where he gives himself insulin injections EXTREMITIES: No pedal edema, cyanosis, or clubbing.  NEUROLOGIC: Cranial nerves II through XII are intact. Muscle strength 5/5 in all extremities. Sensation intact. Gait not checked.  PSYCHIATRIC: The patient is alert and oriented x 3. Seems depressed SKIN: No obvious rash. There is a small furuncle on the right buttock.   LABORATORY PANEL:   CBC  Recent Labs Lab 12/09/14 0758  WBC 8.3  HGB 14.8  HCT 43.7  PLT 205   ------------------------------------------------------------------------------------------------------------------  Chemistries   Recent Labs Lab 12/09/14 0758  NA 134*  K 4.1  CL 95*  CO2 25  GLUCOSE 544*  BUN 15  CREATININE 0.78  CALCIUM 9.0  AST 696*  ALT 931*  ALKPHOS 609*  BILITOT 1.3*   ------------------------------------------------------------------------------------------------------------------  Cardiac Enzymes No results for input(s): TROPONINI in the last 168 hours. ------------------------------------------------------------------------------------------------------------------  RADIOLOGY:  [place] Abdomen Limited Ruq  12/09/2014   CLINICAL DATA:  One week of abdominal pain, abnormal liver enzymes  EXAM: US ABDOMEN LIMITED - RIGHT UPPER QUADRANT  COMPARISON:  Abdominal ultrasound of April 18, 2012  FINDINGS: Gallbladder:  The gallbladder is adequately distended. The gallbladder wall measures 2.7 mm maximally. There is no pericholecystic fluid or positive sonographic Murphy's sign.  Common bile duct:  Diameter: 2.3 mm  Liver:  The liver exhibits normal echotexture with no focal mass  nor ductal dilation.  IMPRESSION: No acute hepatobiliary abnormality is demonstrated. If there remains clinical concern of gallbladder dysfunction, a nuclear medicine hepatobiliary scan with gallbladder ejection fraction would be useful.   Electronically Signed   By: David  Swazil[place]Jordan M.D.   On: 12/09/2014 11:36    EKG:  No orders found for this or any previous visit.  IMPRESSION AND PLAN:   Principal Problem:   Transaminitis Active Problems:   Diabetes mellitus type 1, uncontrolled   Folliculitis   Homelessness  #1 transaminitis: Cholestatic pattern with elevated alkaline phosphatase and bilirubin, but ultrasound is negative. He may have passed a gallstone which would be consistent with his history of 3 months of episodic pain. Could possibly be due to recent shock liver with hypotension secondary to hyperglycemia and diuresis. Tylenol level was negative though he does report significant ingestion of Tylenol Percocet or Vicodin for pain due to dental caries. No fatty liver disease noted on the ultrasound. Will check hepatitis panel, UDS, markers of liver function. We'll hydrate and recheck in the morning.  #2 hyperglycemia secondary to uncontrolled diabetes mellitus type 1: Check hemoglobin A1c. Restart insulin. Consult case management as this patient seems to have difficulty with access to medications and medical care. Carb modified diet.  #3 folliculitis: He does have a small furuncle on the right buttock. Continue with Bactrim. No cellulitis or abscess   All the records are reviewed and  case discussed with ED provider. Management plans discussed with the patient, family and they are in agreement.  CODE STATUS: Full  TOTAL TIME TAKING CARE OF THIS PATIENT: 50 minutes.    Elby Showers M.D on 12/09/2014 at 12:45 PM  Between 7am to 6pm - Pager - [phone]  After 6pm go to [email or web address] - password [email or web address] [facility]  [place] [facility]  Office  [phone]  CC: Primary care  physician; No PCP Per Patient

## 2014-12-09 NOTE — ED Notes (Signed)
Pt returns from US. ibu given as ordered for dental pain.

## 2014-12-09 NOTE — ED Provider Notes (Signed)
[facility] Emergency Department Provider Note  ____________________________________________  Time seen: 8 AM  I have reviewed the triage vital signs and the nursing notes.   HISTORY  Chief Complaint Hyperglycemia      HPI Trevor Brown is a 24 y.o. male who presents via EMS for complaints of elevated blood glucose. He reports he checked his sugar this morning and it was 400. He is noncompliant with his medications. His only physical complaint is a spider bite on his right buttock. No fevers chills. No cough. No dysuria. No abdominal pain or nausea vomiting.He does note frequent urination     Past Medical History  Diagnosis Date  . Diabetes mellitus without complication     There are no active problems to display for this patient.   History reviewed. No pertinent past surgical history.  Current Outpatient Rx  Name  Route  Sig  Dispense  Refill  . amoxicillin (AMOXIL) 500 MG capsule   Oral   Take 1 capsule (500 mg total) by mouth 3 (three) times daily.   30 capsule   0   . naproxen (NAPROSYN) 500 MG tablet   Oral   Take 1 tablet (500 mg total) by mouth 2 (two) times daily.   30 tablet   0     Allergies Bee venom and Tramadol  History reviewed. No pertinent family history.  Social History History  Substance Use Topics  . Smoking status: Current Every Day Smoker  . Smokeless tobacco: Not on file  . Alcohol Use: No    Review of Systems  Constitutional: Negative for fever. Eyes: Negative for visual changes. ENT: Negative for sore throat. Cardiovascular: Negative for chest pain. Respiratory: Negative for shortness of breath. Gastrointestinal: Negative for abdominal pain, vomiting and diarrhea. Genitourinary: Negative for dysuria. Musculoskeletal: Negative for back pain. Skin: Positive for bite on buttock Neurological: Negative for headaches, focal weakness or numbness.   10-point ROS otherwise  negative.  ____________________________________________   PHYSICAL EXAM:  VITAL SIGNS: ED Triage Vitals  Enc Vitals Group     BP 12/09/14 0751 130/79 mmHg     Pulse --      Resp 12/09/14 0751 20     Temp 12/09/14 0751 98.5 F (36.9 C)     Temp Source 12/09/14 0751 Oral     SpO2 12/09/14 0751 98 %     Weight 12/09/14 0751 130 lb (58.968 kg)     Height 12/09/14 0751 5\' 6"  (1.676 m)     Head Cir --      Peak Flow --      Pain Score 12/09/14 0752 10     Pain Loc --      Pain Edu? --      Excl. in GC? --      Constitutional: Alert and oriented. Well appearing and in no distress. Eyes: Conjunctivae are normal. PERRL. Normal extraocular movements. ENT   Head: Normocephalic and atraumatic.   Nose: No congestion/rhinnorhea.   Mouth/Throat: Mucous membranes are moist.   Cardiovascular: Normal rate, regular rhythm. Normal and symmetric distal pulses are present in all extremities. No murmurs, rubs, or gallops. Respiratory: Normal respiratory effort without tachypnea nor retractions. Breath sounds are clear and equal bilaterally. No wheezes/rales/rhonchi. Gastrointestinal: Soft and nontender. No distention. There is no CVA tenderness. Genitourinary: deferred Musculoskeletal: Nontender with normal range of motion in all extremities. No joint effusions.  No lower extremity tenderness nor edema. Neurologic:  Normal speech and language. No gross focal neurologic deficits  are appreciated. Speech is normal.  Skin:  Skin is warm, dry and intact. Patient has small early abscess on right buttock, no fluctuance, no drainable collection approximately 1 x 1 cm. Psychiatric: Mood and affect are normal. Speech and behavior are normal. Patient exhibits appropriate insight and judgment.  ____________________________________________    LABS (pertinent positives/negatives)  Labs Reviewed  GLUCOSE, CAPILLARY - Abnormal; Notable for the following:    Glucose-Capillary 591 (*)    All  other components within normal limits  GLUCOSE, CAPILLARY - Abnormal; Notable for the following:    Glucose-Capillary 538 (*)    All other components within normal limits  COMPREHENSIVE METABOLIC PANEL - Abnormal; Notable for the following:    Sodium 134 (*)    Chloride 95 (*)    Glucose, Bld 544 (*)    AST 696 (*)    ALT 931 (*)    Alkaline Phosphatase 609 (*)    Total Bilirubin 1.3 (*)    All other components within normal limits  URINALYSIS COMPLETEWITH MICROSCOPIC ([facility])  - Abnormal; Notable for the following:    Color, Urine YELLOW (*)    APPearance CLEAR (*)    Glucose, UA >500 (*)    Ketones, ur 2+ (*)    Specific Gravity, Urine 1.036 (*)    All other components within normal limits  GLUCOSE, CAPILLARY - Abnormal; Notable for the following:    Glucose-Capillary 454 (*)    All other components within normal limits  ACETAMINOPHEN LEVEL - Abnormal; Notable for the following:    Acetaminophen (Tylenol), Serum <10 (*)    All other components within normal limits  GLUCOSE, CAPILLARY - Abnormal; Notable for the following:    Glucose-Capillary 233 (*)    All other components within normal limits  CBC  CBG MONITORING, ED     ____________________________________________   EKG  None  ____________________________________________    RADIOLOGY  Ultrasound unremarkable  ____________________________________________   PROCEDURES  Procedure(s) performed: None  Critical Care performed: None    ____________________________________________   INITIAL IMPRESSION / ASSESSMENT AND PLAN / ED COURSE  Pertinent labs & imaging results that were available during my care of the patient were reviewed by me and considered in my medical decision making (see chart for details).  Fingerstick 408. Well-appearing no tachycardia benign exam except for early abscess that will require antibiotics but no I&D. We will hydrate and check a  CMP.  ___________ ----------------------------------------- 11:34 AM on 12/09/2014 -----------------------------------------  Concerning LFTs, acetaminophen level normal pending ultrasound of liver. Looking back through  history did not see any elevation of LFTs in the past. Will require admission for further workup    FINAL CLINICAL IMPRESSION(S) / ED DIAGNOSES  Final diagnoses:  Hyperglycemia  Abscess  Abnormal liver enzymes  Hepatitis     Jene Everyobert Rilyn Upshaw, MD 12/09/14 1156

## 2014-12-09 NOTE — ED Notes (Signed)
Patient transported to Ultrasound 

## 2014-12-10 DIAGNOSIS — F329 Major depressive disorder, single episode, unspecified: Secondary | ICD-10-CM

## 2014-12-10 LAB — CBC
HCT: 41.3 % (ref 40.0–52.0)
Hemoglobin: 13.7 g/dL (ref 13.0–18.0)
MCH: 29.7 pg (ref 26.0–34.0)
MCHC: 33.1 g/dL (ref 32.0–36.0)
MCV: 90 fL (ref 80.0–100.0)
Platelets: 189 10*3/uL (ref 150–440)
RBC: 4.59 MIL/uL (ref 4.40–5.90)
RDW: 12.8 % (ref 11.5–14.5)
WBC: 8.4 10*3/uL (ref 3.8–10.6)

## 2014-12-10 LAB — GLUCOSE, CAPILLARY
Glucose-Capillary: 239 mg/dL — ABNORMAL HIGH (ref 65–99)
Glucose-Capillary: 247 mg/dL — ABNORMAL HIGH (ref 65–99)
Glucose-Capillary: 344 mg/dL — ABNORMAL HIGH (ref 65–99)
Glucose-Capillary: 347 mg/dL — ABNORMAL HIGH (ref 65–99)
Glucose-Capillary: 405 mg/dL — ABNORMAL HIGH (ref 65–99)

## 2014-12-10 LAB — COMPREHENSIVE METABOLIC PANEL
ALT: 710 U/L — ABNORMAL HIGH (ref 17–63)
AST: 639 U/L — ABNORMAL HIGH (ref 15–41)
Albumin: 3.2 g/dL — ABNORMAL LOW (ref 3.5–5.0)
Alkaline Phosphatase: 437 U/L — ABNORMAL HIGH (ref 38–126)
Anion gap: 4 — ABNORMAL LOW (ref 5–15)
BUN: 10 mg/dL (ref 6–20)
CO2: 28 mmol/L (ref 22–32)
Calcium: 8.1 mg/dL — ABNORMAL LOW (ref 8.9–10.3)
Chloride: 105 mmol/L (ref 101–111)
Creatinine, Ser: 0.63 mg/dL (ref 0.61–1.24)
GFR calc Af Amer: >60 mL/min (ref >60)
GFR calc non Af Amer: >60 mL/min (ref >60)
Glucose, Bld: 224 mg/dL — ABNORMAL HIGH (ref 65–99)
Potassium: 3.7 mmol/L (ref 3.5–5.1)
Sodium: 137 mmol/L (ref 135–145)
Total Bilirubin: 0.3 mg/dL (ref 0.3–1.2)
Total Protein: 5.8 g/dL — ABNORMAL LOW (ref 6.5–8.1)

## 2014-12-10 LAB — HEPATITIS B CORE ANTIBODY, TOTAL: Hep B Core Total Ab: NEGATIVE

## 2014-12-10 MED ORDER — CITALOPRAM HYDROBROMIDE 20 MG PO TABS
20.0000 mg | ORAL_TABLET | Freq: Every day | ORAL | Status: DC
  Administered 2014-12-10 – 2014-12-12 (×3): 20 mg via ORAL
  Filled 2014-12-10 (×3): qty 1

## 2014-12-10 MED ORDER — OXYCODONE HCL 5 MG PO TABS
10.0000 mg | ORAL_TABLET | Freq: Four times a day (QID) | ORAL | Status: DC | PRN
  Administered 2014-12-10 – 2014-12-13 (×10): 10 mg via ORAL
  Filled 2014-12-10 (×10): qty 2

## 2014-12-10 MED ORDER — LIVING WELL WITH DIABETES BOOK
Freq: Once | Status: AC
  Administered 2014-12-10: 15:00:00
  Filled 2014-12-10: qty 1

## 2014-12-10 MED ORDER — LIDOCAINE VISCOUS 2 % MT SOLN
5.0000 mL | Freq: Three times a day (TID) | OROMUCOSAL | Status: DC | PRN
  Administered 2014-12-12 (×2): 5 mL via OROMUCOSAL
  Filled 2014-12-10 (×3): qty 5

## 2014-12-10 MED ORDER — MAGIC MOUTHWASH
5.0000 mL | Freq: Three times a day (TID) | ORAL | Status: DC | PRN
  Administered 2014-12-12 (×2): 5 mL via ORAL
  Filled 2014-12-10 (×3): qty 5

## 2014-12-10 MED ORDER — INSULIN GLARGINE 100 UNIT/ML ~~LOC~~ SOLN
18.0000 [IU] | Freq: Every day | SUBCUTANEOUS | Status: DC
  Administered 2014-12-10: 18 [IU] via SUBCUTANEOUS
  Filled 2014-12-10: qty 0.18

## 2014-12-10 MED ORDER — MAGIC MOUTHWASH W/LIDOCAINE
5.0000 mL | Freq: Three times a day (TID) | ORAL | Status: DC | PRN
  Filled 2014-12-10: qty 5

## 2014-12-10 MED ORDER — CLINDAMYCIN HCL 150 MG PO CAPS
300.0000 mg | ORAL_CAPSULE | Freq: Three times a day (TID) | ORAL | Status: DC
  Administered 2014-12-10 – 2014-12-13 (×9): 300 mg via ORAL
  Filled 2014-12-10 (×9): qty 2

## 2014-12-10 MED ORDER — VANCOMYCIN HCL IN DEXTROSE 1-5 GM/200ML-% IV SOLN
1000.0000 mg | Freq: Once | INTRAVENOUS | Status: AC
  Administered 2014-12-10: 1000 mg via INTRAVENOUS
  Filled 2014-12-10: qty 200

## 2014-12-10 NOTE — Plan of Care (Signed)
Problem: Phase II Progression Outcomes Goal: CBGs less than or equal to 200 Outcome: Not Met (add Reason) Infection making BS unstable Noncompliance with diet Support system weak Goal: Tolerating diet Outcome: Not Met (add Reason) Oral complications r/t infected gums and poor dental care

## 2014-12-10 NOTE — Progress Notes (Signed)
[facility] - [place] at [facility]   PATIENT NAME: Trevor Brown    MR#:  [id number]  DATE OF BIRTH:  1991-01-15  SUBJECTIVE:  CHIEF COMPLAINT:   Chief Complaint  Patient presents with  . Hyperglycemia   Admitted for elevated blood sugars. Noncompliant with medications. Very poor personal care. Bad dentition and complaints of severe mouth pain. Unable to drink plenty of fluids. Also complains of abdominal pain. Liver function tests still elevated.  REVIEW OF SYSTEMS:  Review of Systems  Constitutional: Negative for fever and chills.  HENT:       Bad dentition, swollen gums, pain all over oral cavity  Respiratory: Negative for cough, shortness of breath and wheezing.   Cardiovascular: Negative for chest pain and palpitations.  Gastrointestinal: Positive for abdominal pain. Negative for nausea, vomiting, diarrhea and constipation.  Genitourinary: Negative for dysuria.  Neurological: Negative for dizziness, seizures and headaches.    DRUG ALLERGIES:   Allergies  Allergen Reactions  . Bee Venom Anaphylaxis  . Penicillins Anaphylaxis and Hives  . Tramadol Hives and Swelling    VITALS:  Blood pressure 133/74, pulse 60, temperature 97.5 F (36.4 C), temperature source Oral, resp. rate 18, height  (1.676 m), weight 58.968 kg (130 lb), SpO2 99 %.  PHYSICAL EXAMINATION:  Physical Exam  GENERAL:  24 y.o.-year-old patient lying in the bed with no acute distress.  EYES: Pupils equal, round, reactive to light and accommodation. No scleral icterus. Extraocular muscles intact.  HEENT: Head atraumatic, normocephalic. Oropharynx with extremely poor dentition and swollen gums and nasopharynx clear. No oral thrush noted NECK:  Supple, no jugular venous distention. No thyroid enlargement, no tenderness.  LUNGS: Normal breath sounds bilaterally, no wheezing, rales,rhonchi or crepitation. No use of accessory muscles of respiration.  CARDIOVASCULAR: S1, S2 normal.  No murmurs, rubs, or gallops.  ABDOMEN: Abdomen is soft, tenderness around the periumbilical region. There's a small lipoma versus hernia on the right side of the umbilicus. No guarding or rigidity. Bowel sounds present. No organomegaly or mass.  EXTREMITIES: No pedal edema, cyanosis, or clubbing.  NEUROLOGIC: Cranial nerves II through XII are intact. Muscle strength 5/5 in all extremities. Sensation intact. Gait not checked.  PSYCHIATRIC: The patient is alert and oriented x 3.  SKIN: No obvious rash, lesion, or ulcer.    LABORATORY PANEL:   CBC  Recent Labs Lab 12/10/14 0427  WBC 8.4  HGB 13.7  HCT 41.3  PLT 189   ------------------------------------------------------------------------------------------------------------------  Chemistries   Recent Labs Lab 12/10/14 0427  NA 137  K 3.7  CL 105  CO2 28  GLUCOSE 224*  BUN 10  CREATININE 0.63  CALCIUM 8.1*  AST 639*  ALT 710*  ALKPHOS 437*  BILITOT 0.3   ------------------------------------------------------------------------------------------------------------------  Cardiac Enzymes No results for input(s): TROPONINI in the last 168 hours. ------------------------------------------------------------------------------------------------------------------  RADIOLOGY:  US Abdomen Limited Ruq  12/09/2014   CLINICAL DATA:  One week of abdominal pain, abnormal liver enzymes  EXAM: US ABDOMEN LIMITED - RIGHT UPPER QUADRANT  COMPARISON:  Abdominal ultrasound of April 18, 2012  FINDINGS: Gallbladder:  The gallbladder is adequately distended. The gallbladder wall measures 2.7 mm maximally. There is no pericholecystic fluid or positive sonographic Murphy's sign.  Common bile duct:  Diameter: 2.3 mm  Liver:  The liver exhibits normal echotexture with no focal mass nor ductal dilation.  IMPRESSION: No acute hepatobiliary abnormality is demonstrated. If there remains clinical concern of gallbladder dysfunction, a nuclear medicine  hepatobiliary scan with  gallbladder ejection fraction would be useful.   Electronically Signed   By: David  Swazil[place]Jordan M.D.   On: 12/09/2014 11:36    EKG:  No orders found for this or any previous visit.  ASSESSMENT AND PLAN:   24 year old male with past medical history significant for type 1 diabetes mellitus noncompliant with medications, homelessness presents to the hospital secondary to worsening dental caries recent also hyperglycemia.  #1 uncontrolled type 1 diabetes mellitus with hyperglycemia-HbA1c is pending. Increase his Lantus to 18 units 2 daily and also continue NovoLog 3 times a day with sliding scale insulin. Diabetic education prior to discharge. Normal anion gap so no indication for any insulin drip.  #2 dental caries-very poor dentition with no care. Will need to follow up with dentist as an outpatient. Added Magic mouthwash with lidocaine. Continue nystatin. Changed antibiotics to clindamycin. One dose of vancomycin today.  #3 transaminitis-ultrasound of the abdomen with normal liver. Could be cholestasis. Repeat labs in a.m. Hepatitis panel and HIV are ordered. If no improvement will repeat a CT abdomen.  #4 tobacco use disorder-counseled on admission. Started on nicotine patch.  #5 folliculitis of right gluteal region-on clindamycin.  Social worker consult prior to discharge.    All the records are reviewed and case discussed with Care Management/Social Workerr. Management plans discussed with the patient, family and they are in agreement.  CODE STATUS: Full code  TOTAL TIME TAKING CARE OF THIS PATIENT: 37 minutes.   POSSIBLE D/C IN 1-2 DAYS, DEPENDING ON CLINICAL CONDITION.   Enid BaasKALISETTI,Tyeler Goedken M.D on 12/10/2014 at 11:12 AM  Between 7am to 6pm - Pager - [phone]  After 6pm go to [email or web address] - password [email or web address]  [facility]  Office  [phone]  CC: Primary care physician; No PCP Per Patient

## 2014-12-10 NOTE — Progress Notes (Addendum)
Flat affect.to start on celexa  Diabetes education started. Given living with diabetes pamphlet.blood sugars elevated. Requiring sliding scale insulin.Having difficulty chewing d/t poor dentation. Tolerated one time dose of vancomycin and started on cleocin po. Up ad lib

## 2014-12-10 NOTE — Consult Note (Signed)
[facility] Consult   Reason for Consult:  Patient with type 1 diabetes presented with out-of-control blood sugars. Concern about depression Referring Physician:  Tressia Miners Patient Identification: Trevor Brown MRN:  [id number] Principal Diagnosis: Major depression, single episode Diagnosis:   Patient Active Problem List   Diagnosis Date Noted  . Major depression, single episode [F32.9] 12/10/2014  . Diabetes mellitus type 1, uncontrolled [E10.65] 12/09/2014  . Transaminitis [R74.0] 12/09/2014  . Folliculitis [P38.2] 50/53/9767  . Homelessness [Z59.0] 12/09/2014    Total Time spent with patient: 1 hour  Subjective:   Trevor Brown is a 24 y.o. male patient admitted with patient was admitted to cause out of control blood sugar causing multiple physical complaints. Concern was raised about depression and his homelessness.  HPI:  Information obtained from the patient and the chart including review of old chart. Labs reviewed. Vitals reviewed. Patient interviewed. Patient states that he came in the hospital because a sugars were out of control and was making him feel sick. It makes him feel run down and tired and sick to his stomach. He also complains of pain in his mouth as being a major thing that is bothering him. Patient states that his mood does state depressed a lot. He feels somewhat hopeless much of the time. He is currently staying at the local homeless shelter and it sounds like during the day he doesn't do much except sit around. He does have his father with him but he says he doesn't have any friends or any other family that he feels comfortable talking to. Came really describe anything that he enjoys her takes an interest in. Hasn't been able to work because of his sickness. He denies having any hallucinations. Says he sleeps poorly because of the pain that he has that frequently wakes him up. Appetite is up and down but he has been losing weight thanks to his diabetes.  Patient denies having any suicidal thoughts saying that he's never had any wish to harm himself or wish to die and doesn't report homicidal ideation. He doesn't report any hallucinations or psychotic symptoms. He is currently not getting any kind of mental health treatment at all.  Past psychiatric history is negative. Says he has never been evaluated by mental health provider. No history of hospitalizations says he's never been prescribed any medicine for depression or anxiety. No history of suicide attempts.  Medical history is a diagnoses of type 1 diabetes at age 41. He's had a lot of trouble with control ever since then. Sounds like his family has been financially in bad shape and things of gotten worse recently. He already describes trouble with his vision and with his teeth that seem to be related to his high blood sugars.  Social history is that he and his father are homeless and living in the shelter. He says his mother died in 09/14/2022. He and his father were unable to continue paying the utility bills and rent on the place where they were staying. He is not working and his father is currently not working either.  Current medications outside the hospital are none.  Substance abuse history by his report is negative he says he doesn't drink and doesn't abuse any drugs. HPI Elements:   Quality:  Depressed mood and passive in the low energy. Severity:  Moderate to severe. Timing:  Worse over the last couple months. Duration:  Chronic and ongoing getting worse the last couple weeks. Context:  Severe medical problems that are  out of control homelessness recent death of his mother major stresses.  Past Medical History:  Past Medical History  Diagnosis Date  . Diabetes mellitus without complication    History reviewed. No pertinent past surgical history. Family History:  Family History  Problem Relation Age of Onset  . Cirrhosis Mother   . Diabetes Mellitus II Maternal Grandmother     Social History:  History  Alcohol Use No     History  Drug Use No    History   Social History  . Marital Status: Single    Spouse Name: N/A  . Number of Children: N/A  . Years of Education: N/A   Social History Main Topics  . Smoking status: Current Every Day Smoker -- 1.00 packs/day  . Smokeless tobacco: Not on file  . Alcohol Use: No  . Drug Use: No  . Sexual Activity: Yes   Other Topics Concern  . None   Social History Narrative   Additional Social History:                          Allergies:   Allergies  Allergen Reactions  . Bee Venom Anaphylaxis  . Penicillins Anaphylaxis and Hives  . Tramadol Hives and Swelling    Labs:  Results for orders placed or performed during the hospital encounter of 12/09/14 (from the past 48 hour(s))  Glucose, capillary     Status: Abnormal   Collection Time: 12/09/14  7:52 AM  Result Value Ref Range   Glucose-Capillary 591 (HH) 65 - 99 mg/dL   Comment 1 Notify RN    Comment 2 Repeat Test   Glucose, capillary     Status: Abnormal   Collection Time: 12/09/14  7:53 AM  Result Value Ref Range   Glucose-Capillary 538 (H) 65 - 99 mg/dL  CBC     Status: None   Collection Time: 12/09/14  7:58 AM  Result Value Ref Range   WBC 8.3 3.8 - 10.6 K/uL   RBC 4.90 4.40 - 5.90 MIL/uL   Hemoglobin 14.8 13.0 - 18.0 g/dL   HCT 43.7 40.0 - 52.0 %   MCV 89.2 80.0 - 100.0 fL   MCH 30.1 26.0 - 34.0 pg   MCHC 33.8 32.0 - 36.0 g/dL   RDW 12.2 11.5 - 14.5 %   Platelets 205 150 - 440 K/uL  Comprehensive metabolic panel     Status: Abnormal   Collection Time: 12/09/14  7:58 AM  Result Value Ref Range   Sodium 134 (L) 135 - 145 mmol/L   Potassium 4.1 3.5 - 5.1 mmol/L   Chloride 95 (L) 101 - 111 mmol/L   CO2 25 22 - 32 mmol/L   Glucose, Bld 544 (HH) 65 - 99 mg/dL    Comment: CRITICAL RESULT CALLED TO, READ BACK BY AND VERIFIED WITH MONICA MOON AT [phone] 12/09/14 BY DAS    BUN 15 6 - 20 mg/dL   Creatinine, Ser 0.78 0.61 - 1.24  mg/dL   Calcium 9.0 8.9 - 10.3 mg/dL   Total Protein 7.4 6.5 - 8.1 g/dL   Albumin 4.0 3.5 - 5.0 g/dL   AST 696 (H) 15 - 41 U/L   ALT 931 (H) 17 - 63 U/L   Alkaline Phosphatase 609 (H) 38 - 126 U/L   Total Bilirubin 1.3 (H) 0.3 - 1.2 mg/dL   GFR calc non Af Amer >60 >60 mL/min   GFR calc Af Amer >60 >60 mL/min  Comment: (NOTE) The eGFR has been calculated using the CKD EPI equation. This calculation has not been validated in all clinical situations. eGFR's persistently <60 mL/min signify possible Chronic Kidney Disease.    Anion gap 14 5 - 15  Urinalysis complete, with microscopic [facility])     Status: Abnormal   Collection Time: 12/09/14  7:58 AM  Result Value Ref Range   Color, Urine YELLOW (A) YELLOW   APPearance CLEAR (A) CLEAR   Glucose, UA >500 (A) NEGATIVE mg/dL   Bilirubin Urine NEGATIVE NEGATIVE   Ketones, ur 2+ (A) NEGATIVE mg/dL   Specific Gravity, Urine 1.036 (H) 1.005 - 1.030   Hgb urine dipstick NEGATIVE NEGATIVE   pH 6.0 5.0 - 8.0   Protein, ur NEGATIVE NEGATIVE mg/dL   Nitrite NEGATIVE NEGATIVE   Leukocytes, UA NEGATIVE NEGATIVE   RBC / HPF NONE SEEN 0 - 5 RBC/hpf   WBC, UA NONE SEEN 0 - 5 WBC/hpf   Bacteria, UA NONE SEEN NONE SEEN   Squamous Epithelial / LPF NONE SEEN NONE SEEN  Acetaminophen level     Status: Abnormal   Collection Time: 12/09/14  7:58 AM  Result Value Ref Range   Acetaminophen (Tylenol), Serum <10 (L) 10 - 30 ug/mL    Comment:        THERAPEUTIC CONCENTRATIONS VARY SIGNIFICANTLY. A RANGE OF 10-30 ug/mL MAY BE AN EFFECTIVE CONCENTRATION FOR MANY PATIENTS. HOWEVER, SOME ARE BEST TREATED AT CONCENTRATIONS OUTSIDE THIS RANGE. ACETAMINOPHEN CONCENTRATIONS >150 ug/mL AT 4 HOURS AFTER INGESTION AND >50 ug/mL AT 12 HOURS AFTER INGESTION ARE OFTEN ASSOCIATED WITH TOXIC REACTIONS.   Bilirubin, fractionated(tot/dir/indir)     Status: None   Collection Time: 12/09/14  7:58 AM  Result Value Ref Range   Total Bilirubin 0.7 0.3 - 1.2 mg/dL    Bilirubin, Direct 0.2 0.1 - 0.5 mg/dL   Indirect Bilirubin 0.5 0.3 - 0.9 mg/dL  Protime-INR     Status: None   Collection Time: 12/09/14  7:58 AM  Result Value Ref Range   Prothrombin Time 12.7 11.4 - 15.0 seconds   INR 0.93   Glucose, capillary     Status: Abnormal   Collection Time: 12/09/14  9:09 AM  Result Value Ref Range   Glucose-Capillary 454 (H) 65 - 99 mg/dL  Glucose, capillary     Status: Abnormal   Collection Time: 12/09/14 11:26 AM  Result Value Ref Range   Glucose-Capillary 233 (H) 65 - 99 mg/dL  Hepatitis B core antibody, total     Status: None   Collection Time: 12/09/14 12:43 PM  Result Value Ref Range   Hep B Core Total Ab Negative Negative    Comment: (NOTE) Performed At: [facility] [place], [place] [id number] Lindon Romp MD [id number]   Hemoglobin A1c     Status: Abnormal   Collection Time: 12/09/14 12:43 PM  Result Value Ref Range   Hgb A1c MFr Bld 14.3 (H) 4.0 - 6.0 %  Glucose, capillary     Status: Abnormal   Collection Time: 12/09/14  1:35 PM  Result Value Ref Range   Glucose-Capillary 157 (H) 65 - 99 mg/dL  Glucose, capillary     Status: Abnormal   Collection Time: 12/09/14  3:13 PM  Result Value Ref Range   Glucose-Capillary 217 (H) 65 - 99 mg/dL  Creatinine, serum     Status: None   Collection Time: 12/09/14  3:23 PM  Result Value Ref Range   Creatinine, Ser 0.61 0.61 -  1.24 mg/dL   GFR calc non Af Amer >60 >60 mL/min   GFR calc Af Amer >60 >60 mL/min    Comment: (NOTE) The eGFR has been calculated using the CKD EPI equation. This calculation has not been validated in all clinical situations. eGFR's persistently <60 mL/min signify possible Chronic Kidney Disease.   TSH     Status: None   Collection Time: 12/09/14  3:23 PM  Result Value Ref Range   TSH 0.517 0.350 - 4.500 uIU/mL  Glucose, capillary     Status: Abnormal   Collection Time: 12/09/14  8:13 PM  Result Value Ref Range   Glucose-Capillary 463  (H) 65 - 99 mg/dL   Comment 1 Notify RN   Glucose, capillary     Status: Abnormal   Collection Time: 12/10/14  1:24 AM  Result Value Ref Range   Glucose-Capillary 239 (H) 65 - 99 mg/dL   Comment 1 Notify RN   Comprehensive metabolic panel     Status: Abnormal   Collection Time: 12/10/14  4:27 AM  Result Value Ref Range   Sodium 137 135 - 145 mmol/L   Potassium 3.7 3.5 - 5.1 mmol/L   Chloride 105 101 - 111 mmol/L   CO2 28 22 - 32 mmol/L   Glucose, Bld 224 (H) 65 - 99 mg/dL   BUN 10 6 - 20 mg/dL   Creatinine, Ser 0.63 0.61 - 1.24 mg/dL   Calcium 8.1 (L) 8.9 - 10.3 mg/dL   Total Protein 5.8 (L) 6.5 - 8.1 g/dL   Albumin 3.2 (L) 3.5 - 5.0 g/dL   AST 639 (H) 15 - 41 U/L   ALT 710 (H) 17 - 63 U/L   Alkaline Phosphatase 437 (H) 38 - 126 U/L   Total Bilirubin 0.3 0.3 - 1.2 mg/dL   GFR calc non Af Amer >60 >60 mL/min   GFR calc Af Amer >60 >60 mL/min    Comment: (NOTE) The eGFR has been calculated using the CKD EPI equation. This calculation has not been validated in all clinical situations. eGFR's persistently <60 mL/min signify possible Chronic Kidney Disease.    Anion gap 4 (L) 5 - 15  CBC     Status: None   Collection Time: 12/10/14  4:27 AM  Result Value Ref Range   WBC 8.4 3.8 - 10.6 K/uL   RBC 4.59 4.40 - 5.90 MIL/uL   Hemoglobin 13.7 13.0 - 18.0 g/dL   HCT 41.3 40.0 - 52.0 %   MCV 90.0 80.0 - 100.0 fL   MCH 29.7 26.0 - 34.0 pg   MCHC 33.1 32.0 - 36.0 g/dL   RDW 12.8 11.5 - 14.5 %   Platelets 189 150 - 440 K/uL  Glucose, capillary     Status: Abnormal   Collection Time: 12/10/14  7:33 AM  Result Value Ref Range   Glucose-Capillary 247 (H) 65 - 99 mg/dL   Comment 1 Notify RN   Glucose, capillary     Status: Abnormal   Collection Time: 12/10/14 11:42 AM  Result Value Ref Range   Glucose-Capillary 344 (H) 65 - 99 mg/dL   Comment 1 Notify RN     Vitals: Blood pressure 133/74, pulse 60, temperature 97.5 F (36.4 C), temperature source Oral, resp. rate 18, height 5'  6" (1.676 m), weight 58.968 kg (130 lb), SpO2 99 %.  Risk to Self: Is patient at risk for suicide?: No Risk to Others:   Prior Inpatient Therapy:   Prior Outpatient Therapy:  Current Facility-Administered Medications  Medication Dose Route Frequency Provider Last Rate Last Dose  . 0.9 %  sodium chloride infusion   Intravenous Continuous Gladstone Lighter, MD 100 mL/hr at 12/10/14 1220    . citalopram (CELEXA) tablet 20 mg  20 mg Oral Daily Gonzella Lex, MD      . clindamycin (CLEOCIN) capsule 300 mg  300 mg Oral 3 times per day Gladstone Lighter, MD   300 mg at 12/10/14 1402  . heparin injection 5,000 Units  5,000 Units Subcutaneous 3 times per day Aldean Jewett, MD   5,000 Units at 12/10/14 1402  . insulin aspart (novoLOG) injection 0-5 Units  0-5 Units Subcutaneous QHS Aldean Jewett, MD   0 Units at 12/09/14 2121  . insulin aspart (novoLOG) injection 0-9 Units  0-9 Units Subcutaneous TID WC Aldean Jewett, MD   7 Units at 12/10/14 1218  . insulin aspart (novoLOG) injection 3 Units  3 Units Subcutaneous TID WC Aldean Jewett, MD   3 Units at 12/10/14 1219  . insulin glargine (LANTUS) injection 18 Units  18 Units Subcutaneous Daily Gladstone Lighter, MD      . lidocaine (XYLOCAINE) 2 % viscous mouth solution 5 mL  5 mL Mouth/Throat TID PRN Gladstone Lighter, MD      . living well with diabetes book MISC   Does not apply Once Gladstone Lighter, MD      . magic mouthwash  5 mL Oral TID PRN Gladstone Lighter, MD      . nicotine (NICODERM CQ - dosed in mg/24 hours) patch 21 mg  21 mg Transdermal Daily Aldean Jewett, MD   21 mg at 12/10/14 1000  . nystatin (MYCOSTATIN) 100000 UNIT/ML suspension 500,000 Units  5 mL Oral QID Epifanio Lesches, MD   500,000 Units at 12/10/14 1402  . oxyCODONE (Oxy IR/ROXICODONE) immediate release tablet 5 mg  5 mg Oral Q6H PRN Aldean Jewett, MD   5 mg at 12/10/14 1402    Musculoskeletal: Strength & Muscle Tone: decreased and  atrophy Gait & Station: unsteady Patient leans: N/A  Psychiatric Specialty Exam: Physical Exam  Constitutional: He is oriented to person, place, and time. He appears listless. He appears cachectic. He has a sickly appearance.  HENT:  Mouth/Throat: Abnormal dentition. Dental caries present.  Neck: Normal range of motion.  Cardiovascular: Normal rate.   Respiratory: Effort normal.  Musculoskeletal: Normal range of motion.  Neurological: He is oriented to person, place, and time. He appears listless.  Skin: Skin is warm and dry.  Psychiatric: Judgment and thought content normal. His mood appears anxious. His affect is blunt. His speech is delayed. He is slowed and withdrawn. Cognition and memory are normal. He exhibits a depressed mood.    Review of Systems  Constitutional: Positive for weight loss and malaise/fatigue.  HENT: Negative.   Eyes: Positive for blurred vision.  Respiratory: Negative.   Cardiovascular: Negative.   Gastrointestinal: Positive for nausea.  Musculoskeletal: Positive for myalgias.  Skin: Negative.   Neurological: Negative.   Psychiatric/Behavioral: Positive for depression. Negative for suicidal ideas, hallucinations and substance abuse. The patient has insomnia. The patient is not nervous/anxious.     Blood pressure 133/74, pulse 60, temperature 97.5 F (36.4 C), temperature source Oral, resp. rate 18, height '5\' 6"'  (1.676 m), weight 58.968 kg (130 lb), SpO2 99 %.Body mass index is 20.99 kg/(m^2).  General Appearance: Disheveled and Appears to be chronically sick  Eye Contact::  Good  Speech:  Slow  Volume:  Decreased  Mood:  Dysphoric  Affect:  Depressed  Thought Process:  Slow  Orientation:  Full (Time, Place, and Person)  Thought Content:  Negative  Suicidal Thoughts:  No  Homicidal Thoughts:  No  Memory:  Immediate;   Good Recent;   Good Remote;   Good  Judgement:  Fair  Insight:  Fair  Psychomotor Activity:  Decreased and Psychomotor Retardation   Concentration:  Fair  Recall:  Cavour of Knowledge:Fair  Language: Fair  Akathisia:  No  Handed:  Right  AIMS (if indicated):     Assets:  Communication Skills Desire for Improvement Social Support  ADL's:  Impaired  Cognition: Impaired,  Mild  Sleep:      Medical Decision Making: New problem, with additional work up planned, Review of Psycho-Social Stressors (1), Review or order clinical lab tests (1), Review and summation of old records (2) and Review of New Medication or Change in Dosage (2)  Treatment Plan Summary: Plan This is a 24 year old man with no past psychiatric history who has presented to the hospital once again with out-of-control blood sugars. Symptoms he is presenting to me include subjective depressed mood, fatigue and listlessness, some degree of passive that he and hopelessness, poor sleep. He denies suicidal ideation and there is no indication of psychosis. Patient has multiple factors that may be contributing to his symptoms. His diabetes being out of control is unquestionably going to affect his mood and energy level negatively. His recent death of his mother is obviously a huge stress. His homelessness is obviously a huge stress. On the other hand he is reporting some depressive symptoms and it may be worthwhile if we can try something relatively benign to treat them. I suggested adding Celexa 20 mg a day and he has no objection. Side effects and indication discussed. Patient also appreciated reviewing the nature and effects of diabetes. I'm wondering if he may be more cognitively impaired than he appears at first. I will follow-up as needed. Educational counseling completed.  Plan:  Patient does not meet criteria for psychiatric inpatient admission. Supportive therapy provided about ongoing stressors. Discussed crisis plan, support from social network, calling 911, coming to the Emergency Department, and calling Suicide Hotline. Disposition: Medications ordered.  We'll follow-up as needed. He can receive follow-up evaluation and treatment for this at the [facility] to keep things simple since he mainly needs to start seeing a primary care doctor  Alethia Berthold 12/10/2014 2:43 PM

## 2014-12-10 NOTE — Progress Notes (Addendum)
PT REPORTS PAIN ON RIGHT BUTTOCK FORM QUESTIONABLE SPIDER BITE . AREA WITH PIMPLE AREA SURROUNDED BY REDNESS . TENDER TO TOUCH. MD CHG OXYCODONE TO 5-10MG  PO Q6HR PRN. SPOKE WITH DR Larene BeachKALSISETTI. PER MD PT MAY HAVE 1ST DOSE 4HRS AFTER LAST DOSE

## 2014-12-10 NOTE — Progress Notes (Signed)
Patient with unstable FSBS.  Called [facility] for additional orders for insulin. Teeth and gums infected and painful. Patient  in need of dental care. Lives at homeless shelter with dad . Mother recently deceased last year. Dad in room with son and another visitor came in late staying overnight..Patient has very poor support system .

## 2014-12-11 ENCOUNTER — Observation Stay: Payer: Medicaid Other

## 2014-12-11 ENCOUNTER — Encounter: Payer: Self-pay | Admitting: Radiology

## 2014-12-11 DIAGNOSIS — IMO0002 Reserved for concepts with insufficient information to code with codable children: Secondary | ICD-10-CM | POA: Diagnosis present

## 2014-12-11 DIAGNOSIS — E1165 Type 2 diabetes mellitus with hyperglycemia: Secondary | ICD-10-CM | POA: Diagnosis present

## 2014-12-11 DIAGNOSIS — E1065 Type 1 diabetes mellitus with hyperglycemia: Secondary | ICD-10-CM | POA: Diagnosis present

## 2014-12-11 DIAGNOSIS — F320 Major depressive disorder, single episode, mild: Secondary | ICD-10-CM | POA: Insufficient documentation

## 2014-12-11 LAB — CBC
HCT: 41 % (ref 40.0–52.0)
Hemoglobin: 13.6 g/dL (ref 13.0–18.0)
MCH: 29.8 pg (ref 26.0–34.0)
MCHC: 33.1 g/dL (ref 32.0–36.0)
MCV: 90.1 fL (ref 80.0–100.0)
Platelets: 191 10*3/uL (ref 150–440)
RBC: 4.55 MIL/uL (ref 4.40–5.90)
RDW: 12.8 % (ref 11.5–14.5)
WBC: 5.5 10*3/uL (ref 3.8–10.6)

## 2014-12-11 LAB — MISC LABCORP TEST (SEND OUT): Labcorp test code: 140659

## 2014-12-11 LAB — GLUCOSE, CAPILLARY
Glucose-Capillary: 255 mg/dL — ABNORMAL HIGH (ref 65–99)
Glucose-Capillary: 224 mg/dL — ABNORMAL HIGH (ref 65–99)
Glucose-Capillary: 255 mg/dL — ABNORMAL HIGH (ref 65–99)
Glucose-Capillary: 134 mg/dL — ABNORMAL HIGH (ref 65–99)

## 2014-12-11 LAB — COMPREHENSIVE METABOLIC PANEL
ALT: 956 U/L — ABNORMAL HIGH (ref 17–63)
AST: 1033 U/L — ABNORMAL HIGH (ref 15–41)
Albumin: 3.3 g/dL — ABNORMAL LOW (ref 3.5–5.0)
Alkaline Phosphatase: 498 U/L — ABNORMAL HIGH (ref 38–126)
Anion gap: 7 (ref 5–15)
BUN: 9 mg/dL (ref 6–20)
CO2: 26 mmol/L (ref 22–32)
Calcium: 8.4 mg/dL — ABNORMAL LOW (ref 8.9–10.3)
Chloride: 104 mmol/L (ref 101–111)
Creatinine, Ser: 0.65 mg/dL (ref 0.61–1.24)
GFR calc Af Amer: >60 mL/min (ref >60)
GFR calc non Af Amer: >60 mL/min (ref >60)
Glucose, Bld: 265 mg/dL — ABNORMAL HIGH (ref 65–99)
Potassium: 3.8 mmol/L (ref 3.5–5.1)
Sodium: 137 mmol/L (ref 135–145)
Total Bilirubin: 0.5 mg/dL (ref 0.3–1.2)
Total Protein: 6.1 g/dL — ABNORMAL LOW (ref 6.5–8.1)

## 2014-12-11 LAB — HEPATITIS B SURFACE ANTIBODY, QUANTITATIVE: Hepatitis B-Post: 37.1 m[IU]/mL (ref >9.9)

## 2014-12-11 LAB — HEMOGLOBIN A1C: Hgb A1c MFr Bld: 14.3 % — ABNORMAL HIGH (ref 4.0–6.0)

## 2014-12-11 LAB — HIV ANTIBODY (ROUTINE TESTING W REFLEX): HIV Screen 4th Generation wRfx: NONREACTIVE

## 2014-12-11 LAB — HEPATITIS A ANTIBODY, TOTAL: Hep A Total Ab: NEGATIVE

## 2014-12-11 MED ORDER — ONDANSETRON HCL 4 MG/2ML IJ SOLN
4.0000 mg | Freq: Four times a day (QID) | INTRAMUSCULAR | Status: DC | PRN
  Administered 2014-12-11 – 2014-12-12 (×2): 4 mg via INTRAVENOUS
  Filled 2014-12-11 (×2): qty 2

## 2014-12-11 MED ORDER — BACITRACIN-NEOMYCIN-POLYMYXIN OINTMENT TUBE
TOPICAL_OINTMENT | Freq: Three times a day (TID) | CUTANEOUS | Status: DC
  Administered 2014-12-11 – 2014-12-13 (×7): via TOPICAL
  Filled 2014-12-11: qty 15

## 2014-12-11 MED ORDER — INSULIN ASPART 100 UNIT/ML ~~LOC~~ SOLN
5.0000 [IU] | Freq: Three times a day (TID) | SUBCUTANEOUS | Status: DC
  Administered 2014-12-11 – 2014-12-13 (×6): 5 [IU] via SUBCUTANEOUS
  Filled 2014-12-11 (×6): qty 5

## 2014-12-11 MED ORDER — IOHEXOL 300 MG/ML  SOLN
100.0000 mL | Freq: Once | INTRAMUSCULAR | Status: AC | PRN
  Administered 2014-12-11: 100 mL via INTRAVENOUS

## 2014-12-11 MED ORDER — INSULIN GLARGINE 100 UNIT/ML ~~LOC~~ SOLN
20.0000 [IU] | Freq: Two times a day (BID) | SUBCUTANEOUS | Status: DC
  Administered 2014-12-11 – 2014-12-13 (×5): 20 [IU] via SUBCUTANEOUS
  Filled 2014-12-11 (×7): qty 0.2

## 2014-12-11 MED ORDER — IOHEXOL 240 MG/ML SOLN
25.0000 mL | INTRAMUSCULAR | Status: AC
  Administered 2014-12-11 (×2): 25 mL via ORAL

## 2014-12-11 NOTE — Progress Notes (Signed)
Patient sleeping this shift. Dad at bedside. Patient c/o right buttocks pain and mouth soreness due to bad dental caries. Iv fluids continue. patiet up as needed to bathr po antibiotics givenoom . FSBS continue to be elevated slide scale coverage used.

## 2014-12-11 NOTE — Progress Notes (Signed)
[facility] - [place] at [facility]   PATIENT NAME: Trevor Brown    MR#:  [id number]  DATE OF BIRTH:  [birth year]  SUBJECTIVE:  CHIEF COMPLAINT:   Chief Complaint  Patient presents with  . Hyperglycemia   patient with depressed mood. Still has right gluteal pain from folliculitis. Sugars elevated. Complaints of overall pain from bad dentition. LFTs remain elevated and actually worse today.  REVIEW OF SYSTEMS:  ROS   Constitutional: Negative for fever and chills. Chronically ill appearance. HENT:   Bad dentition, swollen gums, pain all over oral cavity  Respiratory: Negative for cough, shortness of breath and wheezing.  Cardiovascular: Negative for chest pain and palpitations.  Gastrointestinal: Positive for abdominal pain. Negative for nausea, vomiting, diarrhea and constipation.  Genitourinary: Negative for dysuria.  Neurological: Negative for dizziness, seizures and headaches.   DRUG ALLERGIES:   Allergies  Allergen Reactions  . Bee Venom Anaphylaxis  . Penicillins Anaphylaxis and Hives  . Tramadol Hives and Swelling    VITALS:  Blood pressure 115/65, pulse 63, temperature 97.8 F (36.6 C), temperature source Oral, resp. rate 18, height  (1.676 m), weight 58.968 kg (130 lb), SpO2 98 %.  PHYSICAL EXAMINATION:  Physical Exam  GENERAL:  24 y.o.-year-old patient lying in the bed with no acute distress. Chronically ill appearing. EYES: Pupils equal, round, reactive to light and accommodation. No scleral icterus. Extraocular muscles intact.  HEENT: Head atraumatic, normocephalic. Oropharynx with extremely poor dentition and swollen gums and nasopharynx clear. No oral thrush noted NECK:  Supple, no jugular venous distention. No thyroid enlargement, no tenderness.  LUNGS: Normal breath sounds bilaterally, no wheezing, rales,rhonchi or crepitation. No use of accessory muscles of respiration.  CARDIOVASCULAR: S1, S2 normal. No murmurs, rubs, or  gallops.  ABDOMEN: Abdomen is soft, tenderness around the periumbilical region. There's a small lipoma versus hernia on the right side of the umbilicus. No guarding or rigidity. Bowel sounds present. No organomegaly or mass.  EXTREMITIES: No pedal edema, cyanosis, or clubbing.  NEUROLOGIC: Cranial nerves II through XII are intact. Muscle strength 5/5 in all extremities. Sensation intact. Gait not checked.  PSYCHIATRIC: The patient is alert and oriented x 3. Depressed mood SKIN: No obvious rash, lesion, or ulcer.    LABORATORY PANEL:   CBC  Recent Labs Lab 12/11/14 0455  WBC 5.5  HGB 13.6  HCT 41.0  PLT 191   ------------------------------------------------------------------------------------------------------------------  Chemistries   Recent Labs Lab 12/11/14 0455  NA 137  K 3.8  CL 104  CO2 26  GLUCOSE 265*  BUN 9  CREATININE 0.65  CALCIUM 8.4*  AST 1033*  ALT 956*  ALKPHOS 498*  BILITOT 0.5   ------------------------------------------------------------------------------------------------------------------  Cardiac Enzymes No results for input(s): TROPONINI in the last 168 hours. ------------------------------------------------------------------------------------------------------------------  RADIOLOGY:  Ct Abdomen Pelvis W Contrast  12/11/2014   CLINICAL DATA:  Abdominal and pelvic pain for 1 week. Elevated LFTs.  EXAM: CT ABDOMEN AND PELVIS WITH CONTRAST  TECHNIQUE: Multidetector CT imaging of the abdomen and pelvis was performed using the standard protocol following bolus administration of intravenous contrast.  CONTRAST:  OMNIPAQUE IOHEXOL 300 MG/ML  SOLN  COMPARISON:  Ultrasound 04/18/2012  FINDINGS: Lower chest: Lung bases are clear. No effusions. Heart is normal size.  Hepatobiliary: No biliary ductal dilatation or focal hepatic lesion. Gallbladder grossly unremarkable.  Pancreas: No focal abnormality or ductal dilatation.  Spleen: No focal abnormality.   Normal size.  Adrenals/Urinary Tract: No focal renal or adrenal abnormality. No  hydronephrosis. Urinary bladder is unremarkable.  Stomach/Bowel: Stomach, large and small bowel grossly unremarkable. Moderate stool burden in the colon. Appendix not definitively seen. No inflammatory process in the right lower quadrant.  Vascular/Lymphatic: No retroperitoneal or mesenteric adenopathy. Aorta normal caliber.  Reproductive: No mass or other significant abnormality.  Other: Small amount of free fluid in the cul-de-sac of the pelvis. No free air.  Musculoskeletal: No focal bone lesion or acute bony abnormality.  IMPRESSION: There is a small amount of free fluid in the pelvis, an abnormal finding in a young male. Exact cause/source not identified.  Moderate stool burden throughout the colon.   Electronically Signed   By: Charlett NoseKevin  Dover M.D.   On: 12/11/2014 10:28   Koreas Abdomen Limited Ruq  12/09/2014   CLINICAL DATA:  One week of abdominal pain, abnormal liver enzymes  EXAM: US ABDOMEN LIMITED - RIGHT UPPER QUADRANT  COMPARISON:  Abdominal ultrasound of April 18, 2012  FINDINGS: Gallbladder:  The gallbladder is adequately distended. The gallbladder wall measures 2.7 mm maximally. There is no pericholecystic fluid or positive sonographic Murphy's sign.  Common bile duct:  Diameter: 2.3 mm  Liver:  The liver exhibits normal echotexture with no focal mass nor ductal dilation.  IMPRESSION: No acute hepatobiliary abnormality is demonstrated. If there remains clinical concern of gallbladder dysfunction, a nuclear medicine hepatobiliary scan with gallbladder ejection fraction would be useful.   Electronically Signed   By: David  Swazil[place] M.D.   On: 12/09/2014 11:36    EKG:  No orders found for this or any previous visit.  ASSESSMENT AND PLAN:   24 year old male with past medical history significant for type 1 diabetes mellitus noncompliant with medications, homelessness presents to the hospital secondary to worsening  dental caries recent also hyperglycemia.  #1 Uncontrolled type 1 diabetes mellitus with hyperglycemia-HbA1c is 14.3. Increase his Lantus to 20 units twice daily and also continue NovoLog 3 times a day with sliding scale insulin. Diabetic education prior to discharge. Normal anion gap so no indication for any insulin drip.  #2 Dental caries-very poor dentition with no care. Will need to follow up with dentist as an outpatient. Added Magic mouthwash with lidocaine. Continue nystatin. Changed antibiotics to clindamycin. Saved one dose of vancomycin yesterday..  #3 Transaminitis-ultrasound of the abdomen with normal liver. Could be cholestasis. Worsening of AST and ALT. Hepatitis panel ordered and is pending. CT of the abdomen ordered today with worsening liver tests. HIV is pending as well. GI consulted. Anti-smooth muscle antibodies and antimitochondrial antibodies have been ordered.  #4 Tobacco use disorder-counseled on admission. Started on nicotine patch.  #5 Folliculitis of right gluteal region-on clindamycin. Pain medication when necessary. Patient walking to parking lot yesterday. Urine drug screen ordered and noted to have cocaine.  Social worker consult prior to discharge.   All the records are reviewed and case discussed with Care Management/Social Workerr. Management plans discussed with the patient, family and they are in agreement.  CODE STATUS: Full code  TOTAL TIME TAKING CARE OF THIS PATIENT: 37 minutes.   POSSIBLE D/C IN 1-2 DAYS, DEPENDING ON CLINICAL CONDITION.   Enid BaasKALISETTI,Shauntia Levengood M.D on 12/11/2014 at 10:48 AM  Between 7am to 6pm - Pager - [phone]  After 6pm go to [email or web address] - password [email or web address] [facility]  [facility]  Office  [phone]  CC: Primary care physician; No PCP Per Patient

## 2014-12-11 NOTE — Consult Note (Signed)
  Psychiatry: Follow-up for this young man with diabetes and multiple complications. Consult yesterday for depression. On interview today the patient says he is still feeling sick and run down but says that he feels emotionally okay. He says he got some sleep last night. Still having significant pain today. Still very slow in his thinking with minimal capacity for outpatient planning.  On review of systems he denies being depressed denies suicidal ideation denies hallucinations.  On mental status exam he is awake and alert. Minimally cooperative. Very slow. Good eye contact. Denies suicidal ideation. Appears very passive. Says he is feeling okay.  Continue citalopram as ordered yesterday. Patient again primarily needs social assistance with being able to afford his insulin and get outpatient treatment otherwise I am sure this problem is going to repeat. I will continue to follow as needed.

## 2014-12-11 NOTE — Plan of Care (Signed)
Problem: Phase III Progression Outcomes Goal: Tolerating diet Outcome: Not Progressing Poor appetite

## 2014-12-11 NOTE — Consult Note (Signed)
Patient urine positive for cocaine.

## 2014-12-11 NOTE — Progress Notes (Signed)
Pt was told to NOT leave floor several times.  Patient remained noncompliant and left floor several times with his dad.  Pt was found yesterday in ED waiting area buying snacks.  Today the supervisor (cheryl) and I found the patient in the parking lot/visitor entrance smoking with father.  Elnita MaxwellCheryl and I reeducated the patient and father of the safety concerns we have with him leaving the floor.  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  Dr. Nemiah CommanderKalisetti was notified and discussed with patient if it happens again he will need to sign AMA papers and leave.

## 2014-12-11 NOTE — Progress Notes (Signed)
Pt  Up ad lib. No increased pain in mouth/teeth. Receiving oxycodone with relief. Blood sugars remain elevated >200.Marland Kitchen.lantus and  novolog tid increased . Will continue to monitor. rn i/s on insulin adm and technique.receiving clindamycin  Po tid. Tolerating well. Affect remains flat, restless . Encouraged to stay on floor. Wanting to snack on junk food . Reinstruction provided on foods to avoid .pustule  Right buttock hard,red and swollen. Less tender to touch . Neosporin applied

## 2014-12-11 NOTE — Progress Notes (Signed)
PT REPORTS EXPERIENCING NAUSEA.. DR SUDINI NOTED. MD TO PLACE ORDER

## 2014-12-11 NOTE — Consult Note (Signed)
GI Inpatient Consult Note GI  Reason for Consult:  Elevated LFT'S   Attending Requesting Consult:  History of Present Illness: Trevor Brown is a 24 y.o. male with very elevated LFT's which were noted on routine labs drawn for evaluation of diabetes out of control.  Past Medical History:  Past Medical History  Diagnosis Date  . Diabetes mellitus without complication     Problem List: Patient Active Problem List   Diagnosis Date Noted  . Major depression, single episode 12/10/2014  . Diabetes mellitus type 1, uncontrolled 12/09/2014  . Transaminitis 12/09/2014  . Folliculitis 12/09/2014  . Homelessness 12/09/2014    Past Surgical History: History reviewed. No pertinent past surgical history.  Allergies: Allergies  Allergen Reactions  . Bee Venom Anaphylaxis  . Penicillins Anaphylaxis and Hives  . Tramadol Hives and Swelling    Home Medications: No prescriptions prior to admission   Home medication reconciliation was completed with the patient.   Scheduled Inpatient Medications:   . citalopram  20 mg Oral Daily  . clindamycin  300 mg Oral 3 times per day  . heparin  5,000 Units Subcutaneous 3 times per day  . insulin aspart  0-5 Units Subcutaneous QHS  . insulin aspart  0-9 Units Subcutaneous TID WC  . insulin aspart  5 Units Subcutaneous TID WC  . insulin glargine  20 Units Subcutaneous BID  . neomycin-bacitracin-polymyxin   Topical TID  . nicotine  21 mg Transdermal Daily  . nystatin  5 mL Oral QID    Continuous Inpatient Infusions:   . sodium chloride 100 mL/hr at 12/10/14 1802    PRN Inpatient Medications:  lidocaine, magic mouthwash, oxyCODONE  Family History: family history includes Cirrhosis in his mother; Diabetes Mellitus II in his maternal grandmother.  Mother had hepatitis C.  Father says he himself has never been to a doctor. The patient's family history is negative for inflammatory bowel disorders, GI malignancy, or solid organ  transplantation.  Social History:   reports that he has been smoking.  He does not have any smokeless tobacco history on file. He reports that he does not drink alcohol or use illicit drugs. The patient denies ETOH, tobacco, or drug use.   Review of Systems: Constitutional: Weight is stable.  Eyes: No changes in vision. ENT: No oral lesions, sore throat. Horrible dentition with many cavites GI: see HPI.  Heme/Lymph: No easy bruising.  CV: No chest pain.  GU: No hematuria.  Integumentary: No rashes.  Neuro: No headaches.  Psych: depression, on medication.  Endocrine: No heat/cold intolerance.  Allergic/Immunologic: No urticaria.  Resp: No cough, SOB.  Musculoskeletal: No joint swelling.    Physical Examination: BP 115/65 mmHg  Pulse 63  Temp(Src) 97.8 F (36.6 C) (Oral)  Resp 18  Ht  (1.676 m)  Wt 58.968 kg (130 lb)  BMI 20.99 kg/m2  SpO2 98% Gen: NAD, alert and oriented x 4 HEENT: PEERLA, EOMI, Neck: supple, no JVD or thyromegaly Chest: CTA bilaterally, no wheezes, crackles, or other adventitious sounds CV: RRR, no m/g/c/r Abd: He has small swollen prominent places on either side of umbilicus which are tender to touch and are where he normally gives himself insulin shots but no shots in last 2 weeks., ND, +BS in all four quadrants; no HSM,  ridigity, or rebound tenderness Ext: no edema, well perfused with 2+ pulses, Skin: periumbilical problem, see "Abd" part of PE Lymph: no LAD  Data: Lab Results  Component Value Date   WBC  5.5 12/11/2014   HGB 13.6 12/11/2014   HCT 41.0 12/11/2014   MCV 90.1 12/11/2014   PLT 191 12/11/2014    Recent Labs Lab 12/09/14 0758 12/10/14 0427 12/11/14 0455  HGB 14.8 13.7 13.6   Lab Results  Component Value Date   NA 137 12/11/2014   K 3.8 12/11/2014   CL 104 12/11/2014   CO2 26 12/11/2014   BUN 9 12/11/2014   CREATININE 0.65 12/11/2014   Lab Results  Component Value Date   ALT 956* 12/11/2014   AST 1033*  12/11/2014   ALKPHOS 498* 12/11/2014   BILITOT 0.5 12/11/2014    Recent Labs Lab 12/09/14 0758  INR 0.93   Assessment/Plan: [name] is a 24 y.o. male With very elevated LFT's and positive test for Hepatitis C.  This may be worse due to out of control diabetes. Recommendations:  Check Hep C genotype and viral load.  Poor candidate for antiviral therapy as he has no insurance and poor compliance with diabetes.  Sometimes methanphetamines  Can cause early distruction of teeth.   Thank you for the consult. Please call with questions or concerns.  Lynnae PrudeELLIOTT, ROBERT, MD   Sandria Manlygi

## 2014-12-12 LAB — COMPREHENSIVE METABOLIC PANEL
ALT: 922 U/L — ABNORMAL HIGH (ref 17–63)
AST: 812 U/L — ABNORMAL HIGH (ref 15–41)
Albumin: 3 g/dL — ABNORMAL LOW (ref 3.5–5.0)
Alkaline Phosphatase: 511 U/L — ABNORMAL HIGH (ref 38–126)
Anion gap: 5 (ref 5–15)
BUN: 7 mg/dL (ref 6–20)
CO2: 26 mmol/L (ref 22–32)
Calcium: 8.4 mg/dL — ABNORMAL LOW (ref 8.9–10.3)
Chloride: 107 mmol/L (ref 101–111)
Creatinine, Ser: 0.5 mg/dL — ABNORMAL LOW (ref 0.61–1.24)
GFR calc Af Amer: >60 mL/min (ref >60)
GFR calc non Af Amer: >60 mL/min (ref >60)
Glucose, Bld: 174 mg/dL — ABNORMAL HIGH (ref 65–99)
Potassium: 3.6 mmol/L (ref 3.5–5.1)
Sodium: 138 mmol/L (ref 135–145)
Total Bilirubin: 1.7 mg/dL — ABNORMAL HIGH (ref 0.3–1.2)
Total Protein: 5.9 g/dL — ABNORMAL LOW (ref 6.5–8.1)

## 2014-12-12 LAB — GLUCOSE, CAPILLARY
Glucose-Capillary: 111 mg/dL — ABNORMAL HIGH (ref 65–99)
Glucose-Capillary: 139 mg/dL — ABNORMAL HIGH (ref 65–99)
Glucose-Capillary: 196 mg/dL — ABNORMAL HIGH (ref 65–99)
Glucose-Capillary: 277 mg/dL — ABNORMAL HIGH (ref 65–99)

## 2014-12-12 LAB — MISC LABCORP TEST (SEND OUT): Labcorp test code: 6510

## 2014-12-12 LAB — MITOCHONDRIAL ANTIBODIES: Mitochondrial M2 Ab, IgG: 6.2 Units (ref 0.0–20.0)

## 2014-12-12 LAB — AFP TUMOR MARKER: AFP-Tumor Marker: 3.9 ng/mL (ref 0.0–8.3)

## 2014-12-12 LAB — ANTI-SMOOTH MUSCLE ANTIBODY, IGG: F-Actin IgG: 10 Units (ref 0–19)

## 2014-12-12 NOTE — Consult Note (Signed)
  Psychiatry: Follow-up consult note for this 24 year old man with type 1 diabetes and multiple social problems. On interview today the patient says that he feels like the citalopram is upsetting his stomach. It's making him have too much stomach pain. Otherwise however he is emotionally feeling better. He feels more optimistic about being able to obtain his medication when he leaves the hospital. Feels more confident about having a stable place to stay.  On review of systems patient continues to have abdominal pain some fatigue. Psychiatrically no hallucinations denies feeling depressed denies suicidal or homicidal ideation.  On mental status exam patient is alert and oriented. Good eye contact. Psychomotor activity are little slow. Speech slow and quiet. Affect flat age. Although that seems to be baseline for him. Denies suicidal ideation and denies any psychotic symptoms.  Based on his report of generally feeling emotionally better and being more optimistic I think it would be fine to go ahead and discontinue the citalopram. I don't think it's worth it to restart another medicine. He'll probably have stomach upset or side effects from other serotonin reuptake inhibitors as well. [facility] hopefully they will be able to manage any problems with his mood that he is having. Discontinue citalopram. Supportive counseling completed.

## 2014-12-12 NOTE — Progress Notes (Signed)
[facility] - [place] at [facility]   PATIENT NAME: Trevor Brown    MR#:  [id number]  DATE OF BIRTH:  1991/06/27  SUBJECTIVE:  CHIEF COMPLAINT:   Chief Complaint  Patient presents with  . Hyperglycemia   continues to be depressed and poor eye contact. Complaints of pain in the mouth and also his right gluteal folliculitis. However much improved than before. Sugars are better controlled. LFTs slightly improving. Labs showed positive hepatitis C.  REVIEW OF SYSTEMS:  ROS   Constitutional: Negative for fever and chills. Chronically ill appearance. HENT:   Bad dentition, swollen gums, pain all over oral cavity  Respiratory: Negative for cough, shortness of breath and wheezing.  Cardiovascular: Negative for chest pain and palpitations.  Gastrointestinal: Positive for abdominal pain. Negative for nausea, vomiting, diarrhea and constipation.  Genitourinary: Negative for dysuria.  Neurological: Negative for dizziness, seizures and headaches.   DRUG ALLERGIES:   Allergies  Allergen Reactions  . Bee Venom Anaphylaxis  . Penicillins Anaphylaxis and Hives  . Tramadol Hives and Swelling    VITALS:  Blood pressure 128/80, pulse 59, temperature 97.9 F (36.6 C), temperature source Oral, resp. rate 18, height  (1.676 m), weight 58.968 kg (130 lb), SpO2 99 %.  PHYSICAL EXAMINATION:  Physical Exam  GENERAL:  24 y.o.-year-old patient lying in the bed with no acute distress. Chronically ill appearing. EYES: Pupils equal, round, reactive to light and accommodation. No scleral icterus. Extraocular muscles intact.  HEENT: Head atraumatic, normocephalic. Oropharynx with extremely poor dentition and swollen gums and nasopharynx clear. No oral thrush noted NECK:  Supple, no jugular venous distention. No thyroid enlargement, no tenderness.  LUNGS: Normal breath sounds bilaterally, no wheezing, rales,rhonchi or crepitation. No use of accessory muscles of  respiration.  CARDIOVASCULAR: S1, S2 normal. No murmurs, rubs, or gallops.  ABDOMEN: Abdomen is soft, tenderness around the periumbilical region. There's a small lipoma versus hernia on the right side of the umbilicus. No guarding or rigidity. Bowel sounds present. No organomegaly or mass.  EXTREMITIES: No pedal edema, cyanosis, or clubbing.  NEUROLOGIC: Cranial nerves II through XII are intact. Muscle strength 5/5 in all extremities. Sensation intact. Gait not checked.  PSYCHIATRIC: The patient is alert and oriented x 3. Depressed mood SKIN: No obvious rash, lesion, or ulcer.    LABORATORY PANEL:   CBC  Recent Labs Lab 12/11/14 0455  WBC 5.5  HGB 13.6  HCT 41.0  PLT 191   ------------------------------------------------------------------------------------------------------------------  Chemistries   Recent Labs Lab 12/12/14 0417  NA 138  K 3.6  CL 107  CO2 26  GLUCOSE 174*  BUN 7  CREATININE 0.50*  CALCIUM 8.4*  AST 812*  ALT 922*  ALKPHOS 511*  BILITOT 1.7*   ------------------------------------------------------------------------------------------------------------------  Cardiac Enzymes No results for input(s): TROPONINI in the last 168 hours. ------------------------------------------------------------------------------------------------------------------  RADIOLOGY:  Ct Abdomen Pelvis W Contrast  12/11/2014   CLINICAL DATA:  Abdominal and pelvic pain for 1 week. Elevated LFTs.  EXAM: CT ABDOMEN AND PELVIS WITH CONTRAST  TECHNIQUE: Multidetector CT imaging of the abdomen and pelvis was performed using the standard protocol following bolus administration of intravenous contrast.  CONTRAST:  OMNIPAQUE IOHEXOL 300 MG/ML  SOLN  COMPARISON:  Ultrasound 04/18/2012  FINDINGS: Lower chest: Lung bases are clear. No effusions. Heart is normal size.  Hepatobiliary: No biliary ductal dilatation or focal hepatic lesion. Gallbladder grossly unremarkable.  Pancreas: No  focal abnormality or ductal dilatation.  Spleen: No focal abnormality.  Normal size.  Adrenals/Urinary Tract: No focal renal or adrenal abnormality. No hydronephrosis. Urinary bladder is unremarkable.  Stomach/Bowel: Stomach, large and small bowel grossly unremarkable. Moderate stool burden in the colon. Appendix not definitively seen. No inflammatory process in the right lower quadrant.  Vascular/Lymphatic: No retroperitoneal or mesenteric adenopathy. Aorta normal caliber.  Reproductive: No mass or other significant abnormality.  Other: Small amount of free fluid in the cul-de-sac of the pelvis. No free air.  Musculoskeletal: No focal bone lesion or acute bony abnormality.  IMPRESSION: There is a small amount of free fluid in the pelvis, an abnormal finding in a young male. Exact cause/source not identified.  Moderate stool burden throughout the colon.   Electronically Signed   By: Charlett NoseKevin  Dover M.D.   On: 12/11/2014 10:28    EKG:  No orders found for this or any previous visit.  ASSESSMENT AND PLAN:   24 year old male with past medical history significant for type 1 diabetes mellitus noncompliant with medications, homelessness presents to the hospital secondary to worsening dental caries recent also hyperglycemia.  #1 Uncontrolled type 1 diabetes mellitus with hyperglycemia-HbA1c is 14.3. Continue his Lantus to 20 units twice daily and also continue NovoLog 3 times a day with sliding scale insulin. Diabetic education prior to discharge. Normal anion gap so no indication for any insulin drip. Blood sugars are much improved today.  #2 Dental caries-very poor dentition with no care. Will need to follow up with dentist as an outpatient. Added Magic mouthwash with lidocaine. Continue nystatin. Changed antibiotics to clindamycin. Received one dose of vancomycin..  #3 acute hepatitis C- with Transaminitis-ultrasound of the abdomen with normal liver.  - CT of the abdomen ordered today with worsening liver  tests. HIV is pending as well. GI consulted. Anti-smooth muscle antibodies and antimitochondrial antibodies have been ordered. -Hepatitis C is positive, PCR and genotype has been ordered. No current treatment in the hospital. Will need out patient follow-up. However patient is very noncompliant. -Follow up labs in the morning.  #4 Tobacco use disorder-counseled on admission. Started on nicotine patch.  #5 Folliculitis of right gluteal region-on clindamycin. Pain medication when necessary. Patient walking to parking lot yesterday. Urine drug screen ordered and noted to have cocaine. Discussed with patient and father not to leave the hospital premises.  Social worker consult prior to discharge. If LFTs are improving, possible discharge tomorrow.  All the records are reviewed and case discussed with Care Management/Social Workerr. Management plans discussed with the patient, family and they are in agreement.  CODE STATUS: Full code  TOTAL TIME TAKING CARE OF THIS PATIENT: 37 minutes.   POSSIBLE D/C TOMORROW, DEPENDING ON CLINICAL CONDITION.   Enid BaasKALISETTI,Johnda Billiot M.D on 12/12/2014 at 2:10 PM  Between 7am to 6pm - Pager - [phone]  After 6pm go to [email or web address] - password [email or web address] [facility]  [facility]  Office  [phone]  CC: Primary care physician; No PCP Per Patient

## 2014-12-12 NOTE — Progress Notes (Signed)
Spoke with Lelon MastLorrie Holt at [facility]. She has spoken with [facility] social worker, [place]. Maxine GlennMonica is very familial with this pt and the family. Lorrie and Maxine GlennMonica are going to work with pt to complete [facility] and getting care started with [facility]. Maxine GlennMonica is going to get pt set up with [facility]. I was assured that this patient would receive the maximum amount of available services. Address dental health with Hilbert CorriganLorrie, she is going to attempt to get him access to dental care. Pt updated.

## 2014-12-12 NOTE — Clinical Social Work Note (Signed)
Clinical Social Work Assessment  Patient Details  Name: Trevor Brown MRN: [id number] Date of Birth: 1990-11-26  Date of referral:  12/09/14               Reason for consult:  Housing Concerns/Homelessness                Permission sought to share information with:  Other Permission granted to share information::  Yes, Verbal Permission Granted  Name::      Ship broker::     Relationship::     Contact Information:     Housing/Transportation Living arrangements for the past 2 months:  Homeless, [place] of Information:  Patient Patient Interpreter Needed:  None Criminal Activity/Legal Involvement Pertinent to Current Situation/Hospitalization:  No - Comment as needed Significant Relationships:  Parents Lives with:  Other (Comment) (In Shelter) Do you feel safe going back to the place where you live?  Yes Need for family participation in patient care:  No (Coment)  Care giving concerns: Patient is a 24 y.o male that lives in the [facility] in [place].    Social Worker assessment / plan: Holiday representative (CSW) met with patient to complete assessment. Patient was sitting up in the bed. Patient was quiet and had flat affect. Patient did not make eye contact. CSW introduced self and explained role of CSW department. Patient reported that he lives in the [facility] with his dad, brother, and sister. Patient reported that he has been at the shelter for 2 weeks. Before patient moved to the shelter patient lived with his dad in a house until they could not afford the bills. Patient reported that his dad works with a [employer]. Patient reported that he does not go to school or work right now because he has been sick. CSW provided emotional support. Patient denied drinking alcohol or using substance abuse. Patient reported that he is not feeling depressed and that his mouth and stomach hurt. Patient is a diabetic and has already been seen by  diabetic coordinator.   Plan is for patient to return to the shelter. CSW will provide patient with a packet of recourses for [facility]. CSW will continue to follow and assist as needed.   Employment status:  Unemployed Forensic scientist:  Self Pay (Medicaid Pending) PT Recommendations:  Not assessed at this time Information / Referral to community resources:  Outpatient Substance Abuse Treatment Options  Patient/Family's Response to care: Patient is agreeable to receiving resources.   Patient/Family's Understanding of and Emotional Response to Diagnosis, Current Treatment, and Prognosis: Patient has a flat affect and was quiet during assessment. Patient answered questions with short answers. CSW provided emotional support.   Emotional Assessment Appearance:  Appears younger than stated age Attitude/Demeanor/Rapport:  Avoidant, Guarded Affect (typically observed):  Flat Orientation:  Oriented to Self, Oriented to Place, Oriented to  Time, Oriented to Situation Alcohol / Substance use:  Other (Denied using drugs or alcohol ) Psych involvement (Current and /or in the community):  Yes (Comment)  Discharge Needs  Concerns to be addressed:  Basic Needs, Compliance Issues Concerns, Mental Health Concerns, Medication Concerns, Homelessness Readmission within the last 30 days:  No Current discharge risk:  Homeless Barriers to Discharge:  Continued Medical Work up, Inadequate or no insurance, Homeless with medical needs   Trevor, Brown 12/12/2014, 2:47 PM

## 2014-12-12 NOTE — Progress Notes (Signed)
Clinical Child psychotherapistocial Worker (CSW) gave patient a packet of [place] [facility]. CSW also spoke with Careers adviserlizabeth financial counselor at [facility]. Per Lanora ManisElizabeth patient is on her list to call. Plan is for patient to return to the shelter. Patient reported that he would try to get a ride from the hospital once he is D/C'ed. CSW will continue to follow and assist as needed.

## 2014-12-12 NOTE — Consult Note (Signed)
Pt LFT's up some, cocaine can sometimes cause inflammation of the liver and could be contributing to his problem.  Pt eating some today and feels some better.  No further recommendations.

## 2014-12-12 NOTE — Progress Notes (Signed)
Patient with very flat affect and minimal conversation regardless of prompting.  Has "spider bite" on right buttock.  Patient believes the bite looks worse today than normal.  Put neosporin and large bandaid over the spot in order to reduce friction from boxers.  Patient complaining of pain in abdomen, teeth, and right buttock.  Oxycodone doing minimal amount to reduce the pain, per patient.

## 2014-12-13 DIAGNOSIS — B1920 Unspecified viral hepatitis C without hepatic coma: Secondary | ICD-10-CM

## 2014-12-13 LAB — COMPREHENSIVE METABOLIC PANEL
ALT: 21 U/L (ref 17–63)
AST: 17 U/L (ref 15–41)
Albumin: 3.7 g/dL (ref 3.5–5.0)
Alkaline Phosphatase: 48 U/L (ref 38–126)
Anion gap: 9 (ref 5–15)
BUN: 11 mg/dL (ref 6–20)
CO2: 26 mmol/L (ref 22–32)
Calcium: 9.5 mg/dL (ref 8.9–10.3)
Chloride: 104 mmol/L (ref 101–111)
Creatinine, Ser: 0.81 mg/dL (ref 0.61–1.24)
GFR calc Af Amer: >60 mL/min (ref >60)
GFR calc non Af Amer: >60 mL/min (ref >60)
Glucose, Bld: 98 mg/dL (ref 65–99)
Potassium: 4.4 mmol/L (ref 3.5–5.1)
Sodium: 139 mmol/L (ref 135–145)
Total Bilirubin: 0.4 mg/dL (ref 0.3–1.2)
Total Protein: 8 g/dL (ref 6.5–8.1)

## 2014-12-13 LAB — HEPATIC FUNCTION PANEL
ALT: 1028 U/L — ABNORMAL HIGH (ref 17–63)
AST: 925 U/L — ABNORMAL HIGH (ref 15–41)
Albumin: 3.4 g/dL — ABNORMAL LOW (ref 3.5–5.0)
Alkaline Phosphatase: 517 U/L — ABNORMAL HIGH (ref 38–126)
Bilirubin, Direct: 1.2 mg/dL — ABNORMAL HIGH (ref 0.1–0.5)
Indirect Bilirubin: 0.7 mg/dL (ref 0.3–0.9)
Total Bilirubin: 1.9 mg/dL — ABNORMAL HIGH (ref 0.3–1.2)
Total Protein: 6.4 g/dL — ABNORMAL LOW (ref 6.5–8.1)

## 2014-12-13 LAB — GLUCOSE, CAPILLARY
Glucose-Capillary: 107 mg/dL — ABNORMAL HIGH (ref 65–99)
Glucose-Capillary: 269 mg/dL — ABNORMAL HIGH (ref 65–99)

## 2014-12-13 LAB — MISC LABCORP TEST (SEND OUT): Labcorp test code: 144028

## 2014-12-13 MED ORDER — CLINDAMYCIN HCL 300 MG PO CAPS: 300.0000 mg | ORAL_CAPSULE | Freq: Three times a day (TID) | ORAL | Status: AC

## 2014-12-13 MED ORDER — INSULIN GLARGINE 100 UNIT/ML ~~LOC~~ SOLN: 20.0000 [IU] | Freq: Two times a day (BID) | SUBCUTANEOUS | Status: DC

## 2014-12-13 MED ORDER — NYSTATIN 100000 UNIT/ML MT SUSP: 5.0000 mL | Freq: Four times a day (QID) | OROMUCOSAL | Status: DC

## 2014-12-13 MED ORDER — BACITRACIN-NEOMYCIN-POLYMYXIN OINTMENT TUBE: 1.0000 "application " | TOPICAL_OINTMENT | Freq: Three times a day (TID) | CUTANEOUS | Status: DC

## 2014-12-13 MED ORDER — OXYCODONE HCL 10 MG PO TABS: 10.0000 mg | ORAL_TABLET | Freq: Four times a day (QID) | ORAL | Status: DC | PRN

## 2014-12-13 MED ORDER — LIDOCAINE VISCOUS 2 % MT SOLN: 5.0000 mL | Freq: Three times a day (TID) | OROMUCOSAL | Status: DC | PRN

## 2014-12-13 MED ORDER — INSULIN ASPART 100 UNIT/ML ~~LOC~~ SOLN: 5.0000 [IU] | Freq: Three times a day (TID) | SUBCUTANEOUS | Status: DC

## 2014-12-13 NOTE — Discharge Summary (Addendum)
[facility]icians - [place] at [facility]   PATIENT NAME: Trevor Brown    MR#:  [id number]  DATE OF BIRTH:  [birth year]  DATE OF ADMISSION:  12/09/2014 ADMITTING PHYSICIAN: Gale Journey, MD  DATE OF DISCHARGE: 12/13/2014  PRIMARY CARE PHYSICIAN: No PCP Per Patient    ADMISSION DIAGNOSIS:  Abscess [L02.91] Hepatitis [K75.9] Hyperglycemia [R73.9] Abnormal liver enzymes [R74.8]  DISCHARGE DIAGNOSIS:  Principal Problem:   Major depression, single episode Active Problems:   Diabetes mellitus type 1, uncontrolled   Transaminitis   Folliculitis   Homelessness   Diabetes type 1, uncontrolled   Major depressive disorder, single episode, mild   Hepatitis C  cocaine abuse  SECONDARY DIAGNOSIS:   Past Medical History  Diagnosis Date  . Diabetes mellitus without complication     HOSPITAL COURSE:   24 year old male with past medical history significant for type 1 diabetes mellitus noncompliant with medications, homelessness presents to the hospital secondary to worsening dental caries recent also hyperglycemia.  #1 Uncontrolled type 1 diabetes mellitus with hyperglycemia-HbA1c is 14.3. Continue his Lantus to 20 units twice daily and also continue NovoLog 3 times a day with sliding scale insulin. Diabetic education prior to discharge. Normal anion gap so no indication for any insulin drip. Blood sugars are much improved now.  #2 Dental caries-very poor dentition with no care. Will need to follow up with dentist as an outpatient. Added Magic mouthwash with lidocaine. Continue nystatin. Changed antibiotics to clindamycin. Received one dose of vancomycin..  #3 acute hepatitis C- with Transaminitis-ultrasound of the abdomen with normal liver.  - CT of the abdomen ordered with worsening liver tests and it showed normal findings. HIV is nonreactive. GI consulted. Anti-smooth muscle antibodies and antimitochondrial antibodies are negative. -Hepatitis C is positive,  PCR and genotype has been ordered. No current treatment in the hospital. Will need out patient follow-up. However patient is very noncompliant. -LFTs are improved today. Possible discharge today. Will set up to follow up with [facility]. - Cocaine in his system can also worsen LFTs Patient has been trying to walk out at times and has been found in the parking lot. His urine tox screen done 2 days ago positive for cocaine  #4 Tobacco use disorder-counseled on admission. Started on nicotine patch.  #5 Folliculitis of right gluteal region-on clindamycin. Pain medication when necessary. Urine drug screen ordered and noted to have cocaine. Discussed with patient and father not to leave the hospital premises.  DISCHARGE CONDITIONS:   Stable with overall poor prognosis because of his noncompliance  CONSULTS OBTAINED:  Treatment Team:  Audery Amel, MD Scot Jun, MD Shaune Pollack, MD  DRUG ALLERGIES:   Allergies  Allergen Reactions  . Bee Venom Anaphylaxis  . Penicillins Anaphylaxis and Hives  . Tramadol Hives and Swelling    DISCHARGE MEDICATIONS:   Current Discharge Medication List    START taking these medications   Details  clindamycin (CLEOCIN) 300 MG capsule Take 1 capsule (300 mg total) by mouth every 8 (eight) hours. Qty: 21 capsule, Refills: 0    insulin aspart (NOVOLOG) 100 UNIT/ML injection Inject 5 Units into the skin 3 (three) times daily with meals. Qty: 10 mL, Refills: 3    insulin glargine (LANTUS) 100 UNIT/ML injection Inject 0.2 mLs (20 Units total) into the skin 2 (two) times daily. Qty: 10 mL, Refills: 3    lidocaine (XYLOCAINE) 2 % solution Use as directed 5 mLs in the mouth or throat 3 (three) times  daily as needed for mouth pain. Qty: 100 mL, Refills: 0    neomycin-bacitracin-polymyxin (NEOSPORIN) OINT Apply 1 application topically 3 (three) times daily. Qty: 1 Tube, Refills: 1    nystatin (MYCOSTATIN) 100000 UNIT/ML suspension Take 5 mLs (500,000  Units total) by mouth 4 (four) times daily. Qty: 60 mL, Refills: 0    oxyCODONE 10 MG TABS Take 1 tablet (10 mg total) by mouth every 6 (six) hours as needed for moderate pain or severe pain. Qty: 20 tablet, Refills: 0    sulfamethoxazole-trimethoprim (BACTRIM DS,SEPTRA DS) 800-160 MG per tablet Take 1 tablet by mouth 2 (two) times daily. Qty: 14 tablet, Refills: 0         DISCHARGE INSTRUCTIONS:   1. [facility] 2. Please f/u with Social worker at [facility] as recommended 3. [facility] f/u in 1-2 weeks 4. LFTs as outpatient in [facility] once cocaine is out of his system.  If you experience worsening of your admission symptoms, develop shortness of breath, life threatening emergency, suicidal or homicidal thoughts you must seek medical attention immediately by calling 911 or calling your MD immediately  if symptoms less severe.  You Must read complete instructions/literature along with all the possible adverse reactions/side effects for all the Medicines you take and that have been prescribed to you. Take any new Medicines after you have completely understood and accept all the possible adverse reactions/side effects.   Please note  You were cared for by a hospitalist during your hospital stay. If you have any questions about your discharge medications or the care you received while you were in the hospital after you are discharged, you can call the unit and asked to speak with the hospitalist on call if the hospitalist that took care of you is not available. Once you are discharged, your primary care physician will handle any further medical issues. Please note that NO REFILLS for any discharge medications will be authorized once you are discharged, as it is imperative that you return to your primary care physician (or establish a relationship with a primary care physician if you do not have one) for your aftercare needs so that they can  reassess your need for medications and monitor your lab values.    Today   CHIEF COMPLAINT:   Chief Complaint  Patient presents with  . Hyperglycemia     VITAL SIGNS:  Blood pressure 117/76, pulse 57, temperature 98.5 F (36.9 C), temperature source Oral, resp. rate 18, height  (1.676 m), weight 58.968 kg (130 lb), SpO2 96 %.  I/O:   Intake/Output Summary (Last 24 hours) at 12/13/14 0839 Last data filed at 12/13/14 0407  Gross per 24 hour  Intake    950 ml  Output      0 ml  Net    950 ml    PHYSICAL EXAMINATION:   Physical Exam   GENERAL: 24 y.o.-year-old patient lying in the bed with no acute distress. Chronically ill appearing. EYES: Pupils equal, round, reactive to light and accommodation. No scleral icterus. Extraocular muscles intact.  HEENT: Head atraumatic, normocephalic. Oropharynx with extremely poor dentition and swollen gums and nasopharynx clear. No oral thrush noted NECK: Supple, no jugular venous distention. No thyroid enlargement, no tenderness.  LUNGS: Normal breath sounds bilaterally, no wheezing, rales,rhonchi or crepitation. No use of accessory muscles of respiration.  CARDIOVASCULAR: S1, S2 normal. No murmurs, rubs, or gallops.  ABDOMEN: Abdomen is  soft, tenderness around the periumbilical region. There's a small lipoma versus hernia on the right side of the umbilicus. No guarding or rigidity. Bowel sounds present. No organomegaly or mass.  EXTREMITIES: No pedal edema, cyanosis, or clubbing.  NEUROLOGIC: Cranial nerves II through XII are intact. Muscle strength 5/5 in all extremities. Sensation intact. Gait not checked.  PSYCHIATRIC: The patient is alert and oriented x 3. Depressed mood SKIN: No obvious rash, lesion, or ulcer.    DATA REVIEW:   CBC  Recent Labs Lab 12/11/14 0455  WBC 5.5  HGB 13.6  HCT 41.0  PLT 191    Chemistries   Recent Labs Lab 12/13/14 0554  NA 139  K 4.4  CL 104  CO2 26  GLUCOSE 98  BUN 11   CREATININE 0.81  CALCIUM 9.5  AST 17  ALT 21  ALKPHOS 48  BILITOT 0.4    Cardiac Enzymes No results for input(s): TROPONINI in the last 168 hours.  Microbiology Results  Results for orders placed or performed in visit on 04/05/14  Beta Strep Culture [facility])     Status: None   Collection Time: 04/05/14  7:32 PM  Result Value Ref Range Status   Micro Text Report   Final       SOURCE: THROAT    ORGANISM 1                MODERATE GROWTH GROUP C STREPTOCOCCUS   ORGANISM 2                MODERATE GROWTH STREPTOCOCCUS PYOGENES (GROUP A)   COMMENT                   -   ANTIBIOTIC                                                      Culture, blood (single)     Status: None   Collection Time: 04/05/14  9:43 PM  Result Value Ref Range Status   Micro Text Report   Final       COMMENT                   NO GROWTH AEROBICALLY/ANAEROBICALLY IN 5 DAYS   ANTIBIOTIC                                                      Culture, blood (single)     Status: None   Collection Time: 04/05/14  9:54 PM  Result Value Ref Range Status   Micro Text Report   Final       COMMENT                   NO GROWTH AEROBICALLY/ANAEROBICALLY IN 5 DAYS   ANTIBIOTIC                                                        RADIOLOGY:  Ct Abdomen Pelvis W Contrast  12/11/2014   CLINICAL DATA:  Abdominal and pelvic pain for 1 week. Elevated LFTs.  EXAM: CT ABDOMEN AND PELVIS WITH CONTRAST  TECHNIQUE: Multidetector CT imaging of the abdomen and pelvis was performed using the standard protocol following bolus administration of intravenous contrast.  CONTRAST:  OMNIPAQUE IOHEXOL 300 MG/ML  SOLN  COMPARISON:  Ultrasound 04/18/2012  FINDINGS: Lower chest: Lung bases are clear. No effusions. Heart is normal size.  Hepatobiliary: No biliary ductal dilatation or focal hepatic lesion. Gallbladder grossly unremarkable.  Pancreas: No focal abnormality or ductal dilatation.  Spleen: No focal abnormality.  Normal size.   Adrenals/Urinary Tract: No focal renal or adrenal abnormality. No hydronephrosis. Urinary bladder is unremarkable.  Stomach/Bowel: Stomach, large and small bowel grossly unremarkable. Moderate stool burden in the colon. Appendix not definitively seen. No inflammatory process in the right lower quadrant.  Vascular/Lymphatic: No retroperitoneal or mesenteric adenopathy. Aorta normal caliber.  Reproductive: No mass or other significant abnormality.  Other: Small amount of free fluid in the cul-de-sac of the pelvis. No free air.  Musculoskeletal: No focal bone lesion or acute bony abnormality.  IMPRESSION: There is a small amount of free fluid in the pelvis, an abnormal finding in a young male. Exact cause/source not identified.  Moderate stool burden throughout the colon.   Electronically Signed   By: Charlett Nose M.D.   On: 12/11/2014 10:28    EKG:  No orders found for this or any previous visit.   Management plans discussed with the patient, family and they are in agreement.  CODE STATUS:     Code Status Orders        Start     Ordered   12/09/14 1445  Full code   Continuous     12/09/14 1444      TOTAL TIME TAKING CARE OF THIS PATIENT: 40 minutes.    Enid Baas M.D on 12/13/2014 at 8:39 AM  Between 7am to 6pm - Pager - [phone]  After 6pm go to [email or web address] - password [email or web address] [facility]  [place] [facility]  Office  [phone]  CC: Primary care physician; No PCP Per Patient

## 2014-12-13 NOTE — Progress Notes (Signed)
Pt discharged home. DC instructions provided and explained. Medications reviewed. Rx given. All questions answered. Pt sent home via taxi.Pennie Banter. Taxi driver to stop at [facility] to get [facility] prescriptions [facility]. Pt stable at discharge.

## 2014-12-13 NOTE — Progress Notes (Signed)
Patient is medically stable for D/C back to [facility] today. Patient reported that he could not find a ride from the hospital. Clinical Social Worker (CSW) contacted Building services engineerZack assistant director of the CSW department who approved taxi voucher to [facility] to get insulin filled and then to the shelter. CSW gave voucher to RN. CSW left message with [facility] and made them aware of patient's D/C. Please reconsult if future social work needs arise. CSW signing off.   Jetta LoutBailey Morgan, LCSWA [phone]

## 2014-12-13 NOTE — Progress Notes (Signed)
[facility] Physicians - [place] at [facility]   PATIENT NAME: Trevor Brown    MR#:  [id number]  DATE OF BIRTH:  1991-07-12  SUBJECTIVE:  CHIEF COMPLAINT:   Chief Complaint  Patient presents with  . Hyperglycemia   LFTs surprisingly normalized today. We will redraw them. Patient has similar complaints like oral pain and pain from his folliculitis in the right gluteal region. But his intake has overall improved and his interaction is better.  REVIEW OF SYSTEMS:  ROS   Constitutional: Negative for fever and chills. Chronically ill appearance. HENT:   Bad dentition, swollen gums, pain all over oral cavity  Respiratory: Negative for cough, shortness of breath and wheezing.  Cardiovascular: Negative for chest pain and palpitations.  Gastrointestinal: Positive for abdominal pain-in the right upper quadrant. Negative for nausea, vomiting, diarrhea and constipation.  Genitourinary: Negative for dysuria.  Neurological: Negative for dizziness, seizures and headaches.   DRUG ALLERGIES:   Allergies  Allergen Reactions  . Bee Venom Anaphylaxis  . Penicillins Anaphylaxis and Hives  . Tramadol Hives and Swelling    VITALS:  Blood pressure 117/76, pulse 57, temperature 98.5 F (36.9 C), temperature source Oral, resp. rate 18, height  (1.676 m), weight 58.968 kg (130 lb), SpO2 96 %.  PHYSICAL EXAMINATION:  Physical Exam  GENERAL:  24 y.o.-year-old patient lying in the bed with no acute distress. Chronically ill appearing. EYES: Pupils equal, round, reactive to light and accommodation. No scleral icterus. Extraocular muscles intact.  HEENT: Head atraumatic, normocephalic. Oropharynx with extremely poor dentition and swollen gums and nasopharynx clear. No oral thrush noted NECK:  Supple, no jugular venous distention. No thyroid enlargement, no tenderness.  LUNGS: Normal breath sounds bilaterally, no wheezing, rales,rhonchi or crepitation. No use of accessory muscles  of respiration.  CARDIOVASCULAR: S1, S2 normal. No murmurs, rubs, or gallops.  ABDOMEN: Abdomen is soft, tenderness around the periumbilical region. There's a small lipoma versus hernia on the right side of the umbilicus. No guarding or rigidity. Bowel sounds present. No organomegaly or mass.  EXTREMITIES: No pedal edema, cyanosis, or clubbing.  NEUROLOGIC: Cranial nerves II through XII are intact. Muscle strength 5/5 in all extremities. Sensation intact. Gait not checked.  PSYCHIATRIC: The patient is alert and oriented x 3. Depressed mood SKIN: No obvious rash, lesion, or ulcer.    LABORATORY PANEL:   CBC  Recent Labs Lab 12/11/14 0455  WBC 5.5  HGB 13.6  HCT 41.0  PLT 191   ------------------------------------------------------------------------------------------------------------------  Chemistries   Recent Labs Lab 12/13/14 0554  NA 139  K 4.4  CL 104  CO2 26  GLUCOSE 98  BUN 11  CREATININE 0.81  CALCIUM 9.5  AST 17  ALT 21  ALKPHOS 48  BILITOT 0.4   ------------------------------------------------------------------------------------------------------------------  Cardiac Enzymes No results for input(s): TROPONINI in the last 168 hours. ------------------------------------------------------------------------------------------------------------------  RADIOLOGY:  Ct Abdomen Pelvis W Contrast  12/11/2014   CLINICAL DATA:  Abdominal and pelvic pain for 1 week. Elevated LFTs.  EXAM: CT ABDOMEN AND PELVIS WITH CONTRAST  TECHNIQUE: Multidetector CT imaging of the abdomen and pelvis was performed using the standard protocol following bolus administration of intravenous contrast.  CONTRAST:  OMNIPAQUE IOHEXOL 300 MG/ML  SOLN  COMPARISON:  Ultrasound 04/18/2012  FINDINGS: Lower chest: Lung bases are clear. No effusions. Heart is normal size.  Hepatobiliary: No biliary ductal dilatation or focal hepatic lesion. Gallbladder grossly unremarkable.  Pancreas: No focal  abnormality or ductal dilatation.  Spleen: No focal abnormality.  Normal size.  Adrenals/Urinary Tract: No focal renal or adrenal abnormality. No hydronephrosis. Urinary bladder is unremarkable.  Stomach/Bowel: Stomach, large and small bowel grossly unremarkable. Moderate stool burden in the colon. Appendix not definitively seen. No inflammatory process in the right lower quadrant.  Vascular/Lymphatic: No retroperitoneal or mesenteric adenopathy. Aorta normal caliber.  Reproductive: No mass or other significant abnormality.  Other: Small amount of free fluid in the cul-de-sac of the pelvis. No free air.  Musculoskeletal: No focal bone lesion or acute bony abnormality.  IMPRESSION: There is a small amount of free fluid in the pelvis, an abnormal finding in a young male. Exact cause/source not identified.  Moderate stool burden throughout the colon.   Electronically Signed   By: Charlett NoseKevin  Dover M.D.   On: 12/11/2014 10:28    EKG:  No orders found for this or any previous visit.  ASSESSMENT AND PLAN:   24 year old male with past medical history significant for type 1 diabetes mellitus noncompliant with medications, homelessness presents to the hospital secondary to worsening dental caries recent also hyperglycemia.  #1 Uncontrolled type 1 diabetes mellitus with hyperglycemia-HbA1c is 14.3. Continue his Lantus to 20 units twice daily and also continue NovoLog 3 times a day with sliding scale insulin. Diabetic education prior to discharge. Normal anion gap so no indication for any insulin drip. Blood sugars are much improved now.  #2 Dental caries-very poor dentition with no care. Will need to follow up with dentist as an outpatient. Added Magic mouthwash with lidocaine. Continue nystatin. Changed antibiotics to clindamycin. Received one dose of vancomycin..  #3 acute hepatitis C- with Transaminitis-ultrasound of the abdomen with normal liver.  - CT of the abdomen ordered with worsening liver tests and it  showed normal findings. HIV is nonreactive. GI consulted. Anti-smooth muscle antibodies and antimitochondrial antibodies are negative. -Hepatitis C is positive, PCR and genotype has been ordered. No current treatment in the hospital. Will need out patient follow-up. However patient is very noncompliant. -LFTs are improved today. Possible discharge today.  #4 Tobacco use disorder-counseled on admission. Started on nicotine patch.  #5 Folliculitis of right gluteal region-on clindamycin. Pain medication when necessary. Urine drug screen ordered and noted to have cocaine. Discussed with patient and father not to leave the hospital premises.  - Appreciate care management consult on this patient in setting up for a physician and to help with the medications.  All the records are reviewed and case discussed with Care Management/Social Workerr. Management plans discussed with the patient, family and they are in agreement.  CODE STATUS: Full code  TOTAL TIME TAKING CARE OF THIS PATIENT: 37 minutes.   POSSIBLE D/C TODAY, DEPENDING ON CLINICAL CONDITION.   Enid BaasKALISETTI,Chriss Mannan M.D on 12/13/2014 at 8:31 AM  Between 7am to 6pm - Pager - [phone]  After 6pm go to [email or web address] - password [email or web address] [facility]  [facility] [place] [facility]  Office  [phone]  CC: Primary care physician; No PCP Per Patient

## 2014-12-14 LAB — MISC LABCORP TEST (SEND OUT): Labcorp test code: 550070

## 2014-12-15 LAB — GLUCOSE, CAPILLARY: Glucose-Capillary: 591 mg/dL (ref 65–99)

## 2015-02-07 ENCOUNTER — Encounter: Payer: Self-pay | Admitting: Emergency Medicine

## 2015-02-07 ENCOUNTER — Other Ambulatory Visit: Payer: Self-pay

## 2015-02-07 ENCOUNTER — Emergency Department: Payer: Self-pay

## 2015-02-07 ENCOUNTER — Emergency Department
Admission: EM | Admit: 2015-02-07 | Discharge: 2015-02-07 | Disposition: A | Discharge status: Home / Self Care | Payer: Medicaid Other | Attending: Emergency Medicine | Admitting: Emergency Medicine

## 2015-02-07 DIAGNOSIS — R739 Hyperglycemia, unspecified: Secondary | ICD-10-CM

## 2015-02-07 DIAGNOSIS — E1065 Type 1 diabetes mellitus with hyperglycemia: Secondary | ICD-10-CM | POA: Insufficient documentation

## 2015-02-07 DIAGNOSIS — Z720 Tobacco use: Secondary | ICD-10-CM | POA: Insufficient documentation

## 2015-02-07 DIAGNOSIS — Z880 Allergy status to penicillin: Secondary | ICD-10-CM | POA: Insufficient documentation

## 2015-02-07 DIAGNOSIS — R079 Chest pain, unspecified: Secondary | ICD-10-CM

## 2015-02-07 HISTORY — DX: Acute myocardial infarction, unspecified: I21.9

## 2015-02-07 LAB — CBC
HCT: 41.2 % (ref 40.0–52.0)
Hemoglobin: 13.7 g/dL (ref 13.0–18.0)
MCH: 30.4 pg (ref 26.0–34.0)
MCHC: 33.2 g/dL (ref 32.0–36.0)
MCV: 91.4 fL (ref 80.0–100.0)
Platelets: 193 10*3/uL (ref 150–440)
RBC: 4.51 MIL/uL (ref 4.40–5.90)
RDW: 12.6 % (ref 11.5–14.5)
WBC: 6.1 10*3/uL (ref 3.8–10.6)

## 2015-02-07 LAB — GLUCOSE, CAPILLARY
Glucose-Capillary: 520 mg/dL — ABNORMAL HIGH (ref 65–99)
Glucose-Capillary: 568 mg/dL (ref 65–99)
Glucose-Capillary: 105 mg/dL — ABNORMAL HIGH (ref 65–99)
Glucose-Capillary: 35 mg/dL — CL (ref 65–99)
Glucose-Capillary: 36 mg/dL — CL (ref 65–99)
Glucose-Capillary: 180 mg/dL — ABNORMAL HIGH (ref 65–99)
Glucose-Capillary: 220 mg/dL — ABNORMAL HIGH (ref 65–99)

## 2015-02-07 LAB — URINALYSIS COMPLETE WITH MICROSCOPIC (ARMC ONLY)
Bacteria, UA: NONE SEEN
Bilirubin Urine: NEGATIVE
Glucose, UA: >500 mg/dL — AB
Hgb urine dipstick: NEGATIVE
Ketones, ur: NEGATIVE mg/dL
Leukocytes, UA: NEGATIVE
Nitrite: NEGATIVE
Protein, ur: NEGATIVE mg/dL
Specific Gravity, Urine: 1.029 (ref 1.005–1.030)
Squamous Epithelial / LPF: NONE SEEN
WBC, UA: NONE SEEN WBC/hpf (ref 0–5)
pH: 7 (ref 5.0–8.0)

## 2015-02-07 LAB — BASIC METABOLIC PANEL
Anion gap: 6 (ref 5–15)
BUN: 13 mg/dL (ref 6–20)
CO2: 26 mmol/L (ref 22–32)
Calcium: 8.7 mg/dL — ABNORMAL LOW (ref 8.9–10.3)
Chloride: 99 mmol/L — ABNORMAL LOW (ref 101–111)
Creatinine, Ser: 0.85 mg/dL (ref 0.61–1.24)
GFR calc Af Amer: >60 mL/min (ref >60)
GFR calc non Af Amer: >60 mL/min (ref >60)
Glucose, Bld: 566 mg/dL (ref 65–99)
Potassium: 4.6 mmol/L (ref 3.5–5.1)
Sodium: 131 mmol/L — ABNORMAL LOW (ref 135–145)

## 2015-02-07 LAB — TROPONIN I: Troponin I: <0.03 ng/mL (ref <0.031)

## 2015-02-07 MED ORDER — INSULIN ASPART 100 UNIT/ML ~~LOC~~ SOLN
10.0000 [IU] | Freq: Once | SUBCUTANEOUS | Status: AC
  Administered 2015-02-07: 10 [IU] via INTRAVENOUS
  Filled 2015-02-07: qty 1

## 2015-02-07 MED ORDER — INSULIN GLARGINE 100 UNIT/ML ~~LOC~~ SOLN: 20.0000 [IU] | Freq: Every day | SUBCUTANEOUS | Status: DC

## 2015-02-07 MED ORDER — SODIUM CHLORIDE 0.9 % IV BOLUS (SEPSIS)
2000.0000 mL | Freq: Once | INTRAVENOUS | Status: AC
  Administered 2015-02-07: 2000 mL via INTRAVENOUS

## 2015-02-07 MED ORDER — INSULIN ASPART 100 UNIT/ML ~~LOC~~ SOLN: 5.0000 [IU] | Freq: Three times a day (TID) | SUBCUTANEOUS | Status: DC

## 2015-02-07 NOTE — ED Provider Notes (Signed)
[facility] Emergency Department Provider Note  ____________________________________________  Time seen: Approximately 1240 PM  I have reviewed the triage vital signs and the nursing notes.   HISTORY  Chief Complaint Chest Pain and Hyperglycemia   HPI Trevor Brown is a 24 y.o. male with a history of diabetes mellitus type 1 and hepatitis C who presents with intermittent chest pain since last night. He says his pain will last for about 5 minutes and is sharpin quality. He denies any pain at this time. He does say that he has shortness of breath associated with the pain. He says it radiates to his back when it is present. No history of blood clots. Denies any drug use at this time. Does admit to smoking. Says he has never used IV drugs. He says that he has been out of his diabetes meds for the past 4 days. Says he just needs a prescription and he will be able to afford them. Says he has a history of a heart attack. Denies any nausea or vomiting. Currently living in a shelter.  Past Medical History  Diagnosis Date  . Diabetes mellitus without complication   . Heart attack     Patient Active Problem List   Diagnosis Date Noted  . Hepatitis C 12/13/2014  . Diabetes type 1, uncontrolled 12/11/2014  . Major depressive disorder, single episode, mild   . Major depression, single episode 12/10/2014  . Diabetes mellitus type 1, uncontrolled 12/09/2014  . Transaminitis 12/09/2014  . Folliculitis 12/09/2014  . Homelessness 12/09/2014    History reviewed. No pertinent past surgical history.  Current Outpatient Rx  Name  Route  Sig  Dispense  Refill  . insulin aspart (NOVOLOG) 100 UNIT/ML injection   Subcutaneous   Inject 5 Units into the skin 3 (three) times daily with meals.   10 mL   3   . insulin glargine (LANTUS) 100 UNIT/ML injection   Subcutaneous   Inject 0.2 mLs (20 Units total) into the skin 2 (two) times daily.   10 mL   3   . oxyCODONE 10 MG  TABS   Oral   Take 1 tablet (10 mg total) by mouth every 6 (six) hours as needed for moderate pain or severe pain.   20 tablet   0     Allergies Bee venom; Penicillins; and Tramadol  Family History  Problem Relation Age of Onset  . Cirrhosis Mother   . Diabetes Mellitus II Maternal Grandmother     Social History History  Substance Use Topics  . Smoking status: Current Every Day Smoker -- 1.00 packs/day  . Smokeless tobacco: Not on file  . Alcohol Use: No    Review of Systems Constitutional: No fever/chills Eyes: No visual changes. ENT: No sore throat. Cardiovascular: As above  Respiratory: As above Gastrointestinal: No abdominal pain.  No nausea, no vomiting.  No diarrhea.  No constipation. Genitourinary: Negative for dysuria. Musculoskeletal: Negative for back pain. Skin: Negative for rash. Neurological: Negative for headaches, focal weakness or numbness.  10-point ROS otherwise negative.  ____________________________________________   PHYSICAL EXAM:  VITAL SIGNS: ED Triage Vitals  Enc Vitals Group     BP 02/07/15 1153 142/61 mmHg     Pulse Rate 02/07/15 1153 85     Resp 02/07/15 1153 16     Temp 02/07/15 1153 98.8 F (37.1 C)     Temp Source 02/07/15 1153 Oral     SpO2 02/07/15 1153 98 %  Weight 02/07/15 1153 120 lb (54.432 kg)     Height 02/07/15 1153 5\' 6"  (1.676 m)     Head Cir --      Peak Flow --      Pain Score 02/07/15 1154 8     Pain Loc --      Pain Edu? --      Excl. in GC? --     Constitutional: Alert and oriented. Well appearing and in no acute distress. Eyes: Conjunctivae are normal. PERRL. EOMI. Head: Atraumatic. Nose: No congestion/rhinnorhea. Mouth/Throat: Mucous membranes are moist.  Oropharynx non-erythematous. Neck: No stridor.   Cardiovascular: Normal rate, regular rhythm. Grossly normal heart sounds.  Good peripheral circulation. Respiratory: Normal respiratory effort.  No retractions. Lungs CTAB. Gastrointestinal: Soft  and nontender. No distention. No abdominal bruits. No CVA tenderness. Musculoskeletal: No lower extremity tenderness nor edema.  No joint effusions. Neurologic:  Normal speech and language. No gross focal neurologic deficits are appreciated. No gait instability. Skin:  Skin is warm, dry and intact. No rash noted. Psychiatric: Mood and affect are normal. Speech and behavior are normal.  ____________________________________________   LABS (all labs ordered are listed, but only abnormal results are displayed)  Labs Reviewed  BASIC METABOLIC PANEL - Abnormal; Notable for the following:    Sodium 131 (*)    Chloride 99 (*)    Glucose, Bld 566 (*)    Calcium 8.7 (*)    All other components within normal limits  GLUCOSE, CAPILLARY - Abnormal; Notable for the following:    Glucose-Capillary 568 (*)    All other components within normal limits  GLUCOSE, CAPILLARY - Abnormal; Notable for the following:    Glucose-Capillary 520 (*)    All other components within normal limits  URINALYSIS COMPLETEWITH MICROSCOPIC ([facility] ONLY) - Abnormal; Notable for the following:    Color, Urine STRAW (*)    APPearance CLEAR (*)    Glucose, UA >500 (*)    All other components within normal limits  GLUCOSE, CAPILLARY - Abnormal; Notable for the following:    Glucose-Capillary 35 (*)    All other components within normal limits  GLUCOSE, CAPILLARY - Abnormal; Notable for the following:    Glucose-Capillary 36 (*)    All other components within normal limits  GLUCOSE, CAPILLARY - Abnormal; Notable for the following:    Glucose-Capillary 105 (*)    All other components within normal limits  GLUCOSE, CAPILLARY - Abnormal; Notable for the following:    Glucose-Capillary 180 (*)    All other components within normal limits  GLUCOSE, CAPILLARY - Abnormal; Notable for the following:    Glucose-Capillary 220 (*)    All other components within normal limits  CBC  TROPONIN I    ____________________________________________  EKG  ED ECG REPORT I, Arelia Longest, the attending physician, personally viewed and interpreted this ECG.   Date: 02/07/2015  EKG Time: 1148  Rate: 85  Rhythm: normal sinus rhythm  Axis: Normal intervals  Intervals:none  ST&T Change: No ST segment elevations or depressions. No abnormal T-wave inversions.  ____________________________________________  RADIOLOGY  No active cardiopulmonary disease on the chest x-ray. I personally reviewed these images. ____________________________________________   PROCEDURES   ____________________________________________   INITIAL IMPRESSION / ASSESSMENT AND PLAN / ED COURSE  Pertinent labs & imaging results that were available during my care of the patient were reviewed by me and considered in my medical decision making (see chart for details).  ----------------------------------------- 12:59 PM on 02/07/2015 -----------------------------------------  Could not find any record of the patient having a myocardial infarction. He has been here several times for diabetic ketoacidosis. He is well-known to the case manager who was given multiple resources for his medical care in the past.  ----------------------------------------- 5:37 PM on 02/07/2015 -----------------------------------------  Patient is resting comfortably at this time. No distress. Glucose went low to 35 and then with food was resolved back to the ones and 200s.  To write scripts for patient's insulin. [facility]. Likely hypoglycemia secondary to noncompliance with medication. Unclear cause of chest pain. However, reassuring labs and imaging. PERC negative. ____________________________________________   FINAL CLINICAL IMPRESSION(S) / ED DIAGNOSES  Acute chest pain. Acute hyperglycemia. Return visit.    Myrna Blazeravid Matthew Zelda Reames, MD 02/07/15 [phone]

## 2015-02-07 NOTE — Discharge Instructions (Signed)

## 2015-02-07 NOTE — ED Notes (Signed)
Pt reports he is diabetic, CBG 568, 520... Pt states he hasn't been taking his insulins, has not been checking his sugars.

## 2015-02-07 NOTE — ED Notes (Addendum)
Pt states chest pain started last night, reports shortness of breath. Reports hx of heart attack. Pt denies any drug use.

## 2015-03-13 ENCOUNTER — Encounter: Payer: Self-pay | Admitting: Emergency Medicine

## 2015-03-13 ENCOUNTER — Emergency Department
Admission: EM | Admit: 2015-03-13 | Discharge: 2015-03-13 | Disposition: A | Discharge status: Home / Self Care | Payer: Medicaid Other | Attending: Emergency Medicine | Admitting: Emergency Medicine

## 2015-03-13 DIAGNOSIS — K029 Dental caries, unspecified: Secondary | ICD-10-CM | POA: Insufficient documentation

## 2015-03-13 DIAGNOSIS — Z720 Tobacco use: Secondary | ICD-10-CM | POA: Insufficient documentation

## 2015-03-13 DIAGNOSIS — E109 Type 1 diabetes mellitus without complications: Secondary | ICD-10-CM | POA: Insufficient documentation

## 2015-03-13 DIAGNOSIS — I10 Essential (primary) hypertension: Secondary | ICD-10-CM | POA: Insufficient documentation

## 2015-03-13 DIAGNOSIS — Z880 Allergy status to penicillin: Secondary | ICD-10-CM | POA: Insufficient documentation

## 2015-03-13 DIAGNOSIS — D571 Sickle-cell disease without crisis: Secondary | ICD-10-CM | POA: Insufficient documentation

## 2015-03-13 DIAGNOSIS — Z794 Long term (current) use of insulin: Secondary | ICD-10-CM | POA: Insufficient documentation

## 2015-03-13 MED ORDER — DIPHENHYDRAMINE HCL 25 MG PO CAPS: 25.0000 mg | ORAL_CAPSULE | ORAL | Status: DC | PRN

## 2015-03-13 MED ORDER — ERYTHROMYCIN BASE 500 MG PO TABS: 500.0000 mg | ORAL_TABLET | Freq: Four times a day (QID) | ORAL | Status: AC

## 2015-03-13 MED ORDER — OXYCODONE-ACETAMINOPHEN 5-325 MG PO TABS: 1.0000 | ORAL_TABLET | ORAL | Status: DC | PRN

## 2015-03-13 NOTE — Discharge Instructions (Signed)
OPTIONS FOR DENTAL FOLLOW UP CARE ° °[place] Department of Health and Human Services - Local Safety Net Dental Clinics °http://www.ncdhhs.gov/dph/oralhealth/services/safetynetclinics.htm °  °[facility] (336-562-3123) ° °[facility] [place] (919-933-9087) ° °[facility] ([phone] ext [phone]) ° °[facility] ([phone]) ° °[facility] ([phone]) °This clinic caters to the indigent population and is on a lottery system. °Location: °[facility], [address], [place] °Clinic Hours: °Wednesdays from 6pm - 9pm, patients seen by a lottery system. °For dates, call or go to [email or web address].edu/[facility]/patients/[email or web address]-[facility] °Services: °Cleanings, fillings and simple extractions. °Payment Options: °DENTAL WORK IS FREE OF CHARGE. Bring proof of income or support. °Best way to get seen: °Arrive at 5:15 pm - this is a lottery, NOT first come/first serve, so arriving earlier will not increase your chances of being seen. °  °  °[facility] °[phone] °Select option 1 for emergencies °  °Location: °[facility], [address], [place] °Clinic Hours: °No walk-ins accepted - call the day before to schedule an appointment. °Check in times are 9:30 am and 1:30 pm. °Services: °Simple extractions, temporary fillings, pulpectomy/pulp debridement, uncomplicated abscess drainage. °Payment Options: °PAYMENT IS DUE AT THE TIME OF SERVICE.  Fee is usually $100-200, additional surgical procedures (e.g. abscess drainage) may be extra. °Cash, checks, Visa/MasterCard accepted.  Can file Medicaid if patient is covered for dental - patient should call case worker to check. °No discount for [facility] patients. °Best way to get seen: °MUST call the day before and get onto the schedule. Can usually be seen the next 1-2 days. No walk-ins accepted. °  °  °[facility] °[phone] °   °Location: °[facility], [address], [place] °Clinic Hours: °M, W, Th, F 8am or 1:30pm, Tues 9a or 1:30 - first come/first served. °Services: °Simple extractions, temporary fillings, uncomplicated abscess drainage.  You do not need to be an [place] resident. °Payment Options: °PAYMENT IS DUE AT THE TIME OF SERVICE. °Dental insurance, otherwise sliding scale - bring proof of income or support. °Depending on income and treatment needed, cost is usually $50-200. °Best way to get seen: °Arrive early as it is first come/first served. °  °  °[facility] °[phone] °  °Location: °[address] °Clinic Hours: °Mon-Thu 8a-5p °Services: °Most basic dental services including extractions and fillings. °Payment Options: °PAYMENT IS DUE AT THE TIME OF SERVICE. °Sliding scale, up to 50% off - bring proof if income or support. °Medicaid with dental option accepted. °Best way to get seen: °Call to schedule an appointment, can usually be seen within 2 weeks OR they will try to see walk-ins - show up at 8a or 2p (you may have to wait). °  °  °[facility] °919-245-2435 °[place] RESIDENTS ONLY °  °Location: °[facility], [address], [place], [place] [address] °Clinic Hours: By appointment only. °Monday - Thursday 8am-5pm, Friday 8am-12pm °Services: Cleanings, fillings, extractions. °Payment Options: °PAYMENT IS DUE AT THE TIME OF SERVICE. °Cash, Visa or MasterCard. Sliding scale - $30 minimum per service. °Best way to get seen: °Come in to office, complete packet and make an appointment - need proof of income °or support monies for each household member and proof of [place] residence. °Usually takes about a month to get in. °  °  °[facility] °[phone] °  °Location: °[address]   [place] °Clinic Hours: Walk-in Urgent Care Dental Services are offered Monday-Friday  mornings only. °The numbers of emergencies accepted daily is limited to the number of °providers available. °Maximum 15 - Mondays, Wednesdays & Thursdays °Maximum 10 - Tuesdays & Fridays °Services: °You do not need to be a [place] resident to be seen for a dental emergency. °Emergencies are defined as pain, swelling, abnormal bleeding, or dental trauma. Walkins will receive x-rays if needed. °NOTE: Dental cleaning is not an emergency. °Payment Options: °PAYMENT IS DUE AT THE TIME OF SERVICE. °Minimum co-pay is $40.00 for uninsured patients. °Minimum co-pay is $3.00 for Medicaid with dental coverage. °Dental Insurance is accepted and must be presented at time of visit. °Medicare does not cover dental. °Forms of payment: Cash, credit card, checks. °Best way to get seen: °If not previously registered with the clinic, walk-in dental registration begins at 7:15 am and is on a first come/first serve basis. °If previously registered with the clinic, call to make an appointment. °  °  °[facility] °[phone] °[place] RESIDENTS ONLY °  °Location: °[address], [place], [place] °Clinic Hours: °Mon-Thu 10a-2p °Services: Extractions only! °Payment Options: °FREE (donations accepted) - bring proof of income or support °Best way to get seen: °Call and schedule an appointment OR come at 8am on the 1st Monday of every month (except for holidays) when it is first come/first served. °  °  °[facility] °[phone] °  °Location: °[address], [place] °Clinic Hours: °Friday mornings °Services, Payment Options, Best way to get seen: °Call for info ° °Dental Pain °Toothache is pain in or around a tooth. It may get worse with chewing or with cold or heat.  °HOME CARE °· Your dentist may use a numbing medicine during treatment. If so, you may need to avoid eating until the medicine wears off. Ask your dentist about this. °· Only take medicine as told by your dentist or doctor. °· Avoid chewing food near  the painful tooth until after all treatment is done. Ask your dentist about this. °GET HELP RIGHT AWAY IF:  °· The problem gets worse or new problems appear. °· You have a fever. °· There is redness and puffiness (swelling) of the face, jaw, or neck. °· You cannot open your mouth. °· There is pain in the jaw. °· There is very bad pain that is not helped by medicine. °MAKE SURE YOU:  °· Understand these instructions. °· Will watch your condition. °· Will get help right away if you are not doing well or get worse. °Document Released: 12/25/2007 Document Revised: 09/30/2011 Document Reviewed: 12/25/2007 °[employer]® Patient Information ©2015 [employer]. This information is not intended to replace advice given to you by your health care provider. Make sure you discuss any questions you have with your health care provider. ° °

## 2015-03-13 NOTE — ED Provider Notes (Signed)
[facility] Emergency Department Provider Note  ____________________________________________  Time seen: Approximately 4:29 PM  I have reviewed the triage vital signs and the nursing notes.   HISTORY  Chief Complaint Sore Throat   HPI Trevor Brown is a 24 y.o. male patient complain of sore throat and dental pain for 2 days. Patient has a long history of dental problems has not seen a dentist in a couple years. Patient rates his pain as a 10 over 10.  Past Medical History  Diagnosis Date  . Diabetes mellitus without complication   . Heart attack     Patient Active Problem List   Diagnosis Date Noted  . Hepatitis C 12/13/2014  . Diabetes type 1, uncontrolled 12/11/2014  . Major depressive disorder, single episode, mild   . Major depression, single episode 12/10/2014  . Diabetes mellitus type 1, uncontrolled 12/09/2014  . Transaminitis 12/09/2014  . Folliculitis 12/09/2014  . Homelessness 12/09/2014    History reviewed. No pertinent past surgical history.  Current Outpatient Rx  Name  Route  Sig  Dispense  Refill  . diphenhydrAMINE (BENADRYL) 25 mg capsule   Oral   Take 1 capsule (25 mg total) by mouth every 4 (four) hours as needed.   30 capsule   0   . erythromycin base (E-MYCIN) 500 MG tablet   Oral   Take 1 tablet (500 mg total) by mouth 4 (four) times daily.   40 tablet   0   . insulin aspart (NOVOLOG) 100 UNIT/ML injection   Subcutaneous   Inject 5 Units into the skin 3 (three) times daily with meals.   10 mL   0   . insulin glargine (LANTUS) 100 UNIT/ML injection   Subcutaneous   Inject 0.2 mLs (20 Units total) into the skin at bedtime.   10 mL   3   . oxyCODONE 10 MG TABS   Oral   Take 1 tablet (10 mg total) by mouth every 6 (six) hours as needed for moderate pain or severe pain.   20 tablet   0   . oxyCODONE-acetaminophen (ROXICET) 5-325 MG per tablet   Oral   Take 1 tablet by mouth every 4 (four) hours as needed for  moderate pain.   8 tablet   0     Allergies Bee venom; Penicillins; and Tramadol  Family History  Problem Relation Age of Onset  . Cirrhosis Mother   . Diabetes Mellitus II Maternal Grandmother     Social History Social History  Substance Use Topics  . Smoking status: Current Every Day Smoker -- 1.00 packs/day  . Smokeless tobacco: None  . Alcohol Use: No    Review of Systems Constitutional: No fever/chills Eyes: No visual changes. ENT: Sore throat and dental pain. Cardiovascular: Denies chest pain. Respiratory: Denies shortness of breath. Gastrointestinal: No abdominal pain.  No nausea, no vomiting.  No diarrhea.  No constipation. Genitourinary: Negative for dysuria. Musculoskeletal: Negative for back pain. Skin: Negative for rash. Neurological: Negative for headaches, focal weakness or numbness. Endocrine:Diabetes and hypertension. Hematological/Lymphatic: Sickle cell anemia Allergic/Immunilogical: See medication list  10-point ROS otherwise negative.  ____________________________________________   PHYSICAL EXAM:  VITAL SIGNS: ED Triage Vitals  Enc Vitals Group     BP 03/13/15 1612 134/70 mmHg     Pulse Rate 03/13/15 1612 81     Resp 03/13/15 1612 20     Temp 03/13/15 1612 98.7 F (37.1 C)     Temp Source 03/13/15 1612 Oral  SpO2 03/13/15 1612 99 %     Weight 03/13/15 1612 120 lb (54.432 kg)     Height 03/13/15 1612 5\' 6"  (1.676 m)     Head Cir --      Peak Flow --      Pain Score 03/13/15 1624 10     Pain Loc --      Pain Edu? --      Excl. in GC? --     Constitutional: Alert and oriented. Well appearing and in no acute distress. Eyes: Conjunctivae are normal. PERRL. EOMI. Head: Atraumatic. Nose: No congestion/rhinnorhea. Mouth/Throat: Mucous membranes are moist.  Oropharynx erythematous. Multiple caries Neck: No stridor.  No cervical spine tenderness to palpation. Hematological/Lymphatic/Immunilogical: No cervical  lymphadenopathy. Cardiovascular: Normal rate, regular rhythm. Grossly normal heart sounds.  Good peripheral circulation. Respiratory: Normal respiratory effort.  No retractions. Lungs CTAB. Gastrointestinal: Soft and nontender. No distention. No abdominal bruits. No CVA tenderness. Musculoskeletal: No lower extremity tenderness nor edema.  No joint effusions. Neurologic:  Normal speech and language. No gross focal neurologic deficits are appreciated. No gait instability. Skin:  Skin is warm, dry and intact. No rash noted. Psychiatric: Mood and affect are normal. Speech and behavior are normal.  ____________________________________________   LABS (all labs ordered are listed, but only abnormal results are displayed)  Labs Reviewed - No data to display ____________________________________________  EKG   ____________________________________________  RADIOLOGY   ____________________________________________   PROCEDURES  Procedure(s) performed: None  Critical Care performed: No  ____________________________________________   INITIAL IMPRESSION / ASSESSMENT AND PLAN / ED COURSE  Pertinent labs & imaging results that were available during my care of the patient were reviewed by me and considered in my medical decision making (see chart for details).  Dental pain secondary to multiple caries. Patient given prescription for amoxicillin in 2 days supply of tramadol. Patient advised to follow up with dentist from the list was presented. OPTIONS FOR DENTAL FOLLOW UP CARE  [place] [facility] and [facility] - Local Safety Net Dental Clinics [email or web address]   [facility] [phone])  [facility] [phone])  [place] [phone])  [facility] [phone])  [facility] [phone]) This clinic caters to the indigent population and is on  a lottery system. Location: [employer] [facility], [employer], [address] [place] Clinic Hours: Wednesdays from 6pm - 9pm, patients seen by a lottery system. For dates, call or go to [email or web address] Services: Cleanings, fillings and simple extractions. Payment Options: DENTAL WORK IS FREE OF CHARGE. Bring proof of income or support. Best way to get seen: Arrive at 5:15 pm - this is a lottery, NOT first come/first serve, so arriving earlier will not increase your chances of being seen.     [facility] [phone] Select option 1 for emergencies   Location: [facility], [place], [address] [place] Clinic Hours: No walk-ins accepted - call the day before to schedule an appointment. Check in times are 9:30 am and 1:30 pm. Services: Simple extractions, temporary fillings, pulpectomy/pulp debridement, uncomplicated abscess drainage. Payment Options: PAYMENT IS DUE AT THE TIME OF SERVICE.  Fee is usually $100-200, additional surgical procedures (e.g. abscess drainage) may be extra. Cash, checks, Visa/MasterCard accepted.  Can file Medicaid if patient is covered for dental - patient should call case worker to check. No discount for [facility] patients. Best way to get seen: MUST call the day  before and get onto the schedule. Can usually be seen the next 1-2 days. No walk-ins accepted.     [facility] [phone]   Location: [facility], [address], [place] Clinic Hours: M, W, Th, F 8am or 1:30pm, Tues 9a or 1:30 - first come/first served. Services: Simple extractions, temporary fillings, uncomplicated abscess drainage.  You do not need to be an [facility] resident. Payment Options: PAYMENT IS DUE AT THE TIME OF SERVICE. Dental insurance, otherwise sliding scale - bring proof of income or support. Depending on income and treatment needed,  cost is usually $50-200. Best way to get seen: Arrive early as it is first come/first served.     [facility] [phone]   Location: [address] Clinic Hours: Mon-Thu 8a-5p Services: Most basic dental services including extractions and fillings. Payment Options: PAYMENT IS DUE AT THE TIME OF SERVICE. Sliding scale, up to 50% off - bring proof if income or support. Medicaid with dental option accepted. Best way to get seen: Call to schedule an appointment, can usually be seen within 2 weeks OR they will try to see walk-ins - show up at 8a or 2p (you may have to wait).     [facility] [phone] [place] RESIDENTS ONLY   Location: [facility], [address], [place], [place] [address] Clinic Hours: By appointment only. Monday - Thursday 8am-5pm, Friday 8am-12pm Services: Cleanings, fillings, extractions. Payment Options: PAYMENT IS DUE AT THE TIME OF SERVICE. Cash, Visa or MasterCard. Sliding scale - $30 minimum per service. Best way to get seen: Come in to office, complete packet and make an appointment - need proof of income or support monies for each household member and proof of [facility] residence. Usually takes about a month to get in.     [facility] [phone]   Location: [address] [facility] Clinic Hours: Walk-in Urgent Care Dental Services are offered Monday-Friday mornings only. The numbers of emergencies accepted daily is limited to the number of providers available. Maximum 15 - Mondays, Wednesdays & Thursdays Maximum 10 - Tuesdays & Fridays Services: You do not need to be a [facility] resident to be seen for a dental emergency. Emergencies are defined as pain, swelling, abnormal bleeding, or dental trauma. Walkins will receive x-rays if needed. NOTE: Dental cleaning is not an emergency. Payment Options: PAYMENT IS  DUE AT THE TIME OF SERVICE. Minimum co-pay is $40.00 for uninsured patients. Minimum co-pay is $3.00 for Medicaid with dental coverage. Dental Insurance is accepted and must be presented at time of visit. Medicare does not cover dental. Forms of payment: Cash, credit card, checks. Best way to get seen: If not previously registered with the clinic, walk-in dental registration begins at 7:15 am and is on a first come/first serve basis. If previously registered with the clinic, call to make an appointment.     [facility] [phone] [place] RESIDENTS ONLY   Location: [address], [place], [place] Clinic Hours: Mon-Thu 10a-2p Services: Extractions only! Payment Options: FREE (donations accepted) - bring proof of income or support Best way to get seen: Call and schedule an appointment OR come at 8am on the 1st Monday of every month (except for holidays) when it is first come/first served.     [facility] [phone]   Location: [address] [place], [place] Clinic Hours: Friday mornings Services, Payment Options, Best way to get seen:  Call for info  ____________________________________________   FINAL CLINICAL IMPRESSION(S) / ED DIAGNOSES  Final diagnoses:  Pain due to dental caries      Joni Reining, PA-C 03/13/15 1642  Phineas Semen, MD 03/13/15 1728

## 2015-03-13 NOTE — ED Notes (Signed)
Sore throat for about 2 days   Hx of abscess in past and feels same

## 2015-04-07 ENCOUNTER — Encounter: Payer: Self-pay | Admitting: Internal Medicine

## 2015-04-07 ENCOUNTER — Inpatient Hospital Stay
Admission: EM | Admit: 2015-04-07 | Discharge: 2015-04-08 | DRG: 639 | Disposition: A | Discharge status: Home / Self Care | Payer: Medicaid Other | Attending: Internal Medicine | Admitting: Internal Medicine

## 2015-04-07 DIAGNOSIS — Z886 Allergy status to analgesic agent status: Secondary | ICD-10-CM

## 2015-04-07 DIAGNOSIS — Z9114 Patient's other noncompliance with medication regimen: Secondary | ICD-10-CM | POA: Diagnosis present

## 2015-04-07 DIAGNOSIS — Z8619 Personal history of other infectious and parasitic diseases: Secondary | ICD-10-CM

## 2015-04-07 DIAGNOSIS — R739 Hyperglycemia, unspecified: Secondary | ICD-10-CM

## 2015-04-07 DIAGNOSIS — Z794 Long term (current) use of insulin: Secondary | ICD-10-CM

## 2015-04-07 DIAGNOSIS — R197 Diarrhea, unspecified: Secondary | ICD-10-CM

## 2015-04-07 DIAGNOSIS — K088 Other specified disorders of teeth and supporting structures: Secondary | ICD-10-CM | POA: Diagnosis present

## 2015-04-07 DIAGNOSIS — F17210 Nicotine dependence, cigarettes, uncomplicated: Secondary | ICD-10-CM | POA: Diagnosis present

## 2015-04-07 DIAGNOSIS — E1110 Type 2 diabetes mellitus with ketoacidosis without coma: Secondary | ICD-10-CM

## 2015-04-07 DIAGNOSIS — Z888 Allergy status to other drugs, medicaments and biological substances status: Secondary | ICD-10-CM

## 2015-04-07 DIAGNOSIS — E1010 Type 1 diabetes mellitus with ketoacidosis without coma: Principal | ICD-10-CM

## 2015-04-07 DIAGNOSIS — I252 Old myocardial infarction: Secondary | ICD-10-CM

## 2015-04-07 DIAGNOSIS — F329 Major depressive disorder, single episode, unspecified: Secondary | ICD-10-CM | POA: Diagnosis present

## 2015-04-07 DIAGNOSIS — Z91148 Patient's other noncompliance with medication regimen for other reason: Secondary | ICD-10-CM

## 2015-04-07 DIAGNOSIS — Z91030 Bee allergy status: Secondary | ICD-10-CM

## 2015-04-07 DIAGNOSIS — E440 Moderate protein-calorie malnutrition: Secondary | ICD-10-CM | POA: Insufficient documentation

## 2015-04-07 DIAGNOSIS — Z79899 Other long term (current) drug therapy: Secondary | ICD-10-CM

## 2015-04-07 DIAGNOSIS — R1110 Vomiting, unspecified: Secondary | ICD-10-CM

## 2015-04-07 HISTORY — DX: Acute hepatitis C without hepatic coma: B17.10

## 2015-04-07 LAB — GLUCOSE, CAPILLARY
Glucose-Capillary: >600 mg/dL (ref 65–99)
Glucose-Capillary: 389 mg/dL — ABNORMAL HIGH (ref 65–99)
Glucose-Capillary: 404 mg/dL — ABNORMAL HIGH (ref 65–99)
Glucose-Capillary: 416 mg/dL — ABNORMAL HIGH (ref 65–99)
Glucose-Capillary: 430 mg/dL — ABNORMAL HIGH (ref 65–99)
Glucose-Capillary: 414 mg/dL — ABNORMAL HIGH (ref 65–99)
Glucose-Capillary: 396 mg/dL — ABNORMAL HIGH (ref 65–99)
Glucose-Capillary: 452 mg/dL — ABNORMAL HIGH (ref 65–99)
Glucose-Capillary: 435 mg/dL — ABNORMAL HIGH (ref 65–99)
Glucose-Capillary: 331 mg/dL — ABNORMAL HIGH (ref 65–99)
Glucose-Capillary: 266 mg/dL — ABNORMAL HIGH (ref 65–99)
Glucose-Capillary: 238 mg/dL — ABNORMAL HIGH (ref 65–99)

## 2015-04-07 LAB — BASIC METABOLIC PANEL
Anion gap: 22 — ABNORMAL HIGH (ref 5–15)
Anion gap: 21 — ABNORMAL HIGH (ref 5–15)
Anion gap: 17 — ABNORMAL HIGH (ref 5–15)
BUN: 19 mg/dL (ref 6–20)
BUN: 22 mg/dL — ABNORMAL HIGH (ref 6–20)
BUN: 24 mg/dL — ABNORMAL HIGH (ref 6–20)
CO2: 20 mmol/L — ABNORMAL LOW (ref 22–32)
CO2: 11 mmol/L — ABNORMAL LOW (ref 22–32)
CO2: 13 mmol/L — ABNORMAL LOW (ref 22–32)
Calcium: 9.9 mg/dL (ref 8.9–10.3)
Calcium: 9.4 mg/dL (ref 8.9–10.3)
Calcium: 9.4 mg/dL (ref 8.9–10.3)
Chloride: 90 mmol/L — ABNORMAL LOW (ref 101–111)
Chloride: 103 mmol/L (ref 101–111)
Chloride: 110 mmol/L (ref 101–111)
Creatinine, Ser: 1.32 mg/dL — ABNORMAL HIGH (ref 0.61–1.24)
Creatinine, Ser: 1.24 mg/dL (ref 0.61–1.24)
Creatinine, Ser: 1.37 mg/dL — ABNORMAL HIGH (ref 0.61–1.24)
GFR calc Af Amer: >60 mL/min (ref >60)
GFR calc Af Amer: >60 mL/min (ref >60)
GFR calc Af Amer: >60 mL/min (ref >60)
GFR calc non Af Amer: >60 mL/min (ref >60)
GFR calc non Af Amer: >60 mL/min (ref >60)
GFR calc non Af Amer: >60 mL/min (ref >60)
Glucose, Bld: 666 mg/dL (ref 65–99)
Glucose, Bld: 463 mg/dL — ABNORMAL HIGH (ref 65–99)
Glucose, Bld: 313 mg/dL — ABNORMAL HIGH (ref 65–99)
Potassium: 4.7 mmol/L (ref 3.5–5.1)
Potassium: 5.5 mmol/L — ABNORMAL HIGH (ref 3.5–5.1)
Potassium: 3.6 mmol/L (ref 3.5–5.1)
Sodium: 132 mmol/L — ABNORMAL LOW (ref 135–145)
Sodium: 135 mmol/L (ref 135–145)
Sodium: 140 mmol/L (ref 135–145)

## 2015-04-07 LAB — URINE DRUG SCREEN, QUALITATIVE (ARMC ONLY)
Amphetamines, Ur Screen: NOT DETECTED
Barbiturates, Ur Screen: NOT DETECTED
Benzodiazepine, Ur Scrn: NOT DETECTED
Cannabinoid 50 Ng, Ur ~~LOC~~: POSITIVE — AB
Cocaine Metabolite,Ur ~~LOC~~: POSITIVE — AB
MDMA (Ecstasy)Ur Screen: NOT DETECTED
Methadone Scn, Ur: NOT DETECTED
Opiate, Ur Screen: NOT DETECTED
Phencyclidine (PCP) Ur S: NOT DETECTED
Tricyclic, Ur Screen: NOT DETECTED

## 2015-04-07 LAB — MRSA PCR SCREENING: MRSA by PCR: POSITIVE — AB

## 2015-04-07 LAB — CBC
HCT: 45.9 % (ref 40.0–52.0)
Hemoglobin: 15.1 g/dL (ref 13.0–18.0)
MCH: 29.9 pg (ref 26.0–34.0)
MCHC: 32.9 g/dL (ref 32.0–36.0)
MCV: 90.9 fL (ref 80.0–100.0)
Platelets: 254 10*3/uL (ref 150–440)
RBC: 5.05 MIL/uL (ref 4.40–5.90)
RDW: 12.2 % (ref 11.5–14.5)
WBC: 10 10*3/uL (ref 3.8–10.6)

## 2015-04-07 LAB — HEPATIC FUNCTION PANEL
ALT: 67 U/L — ABNORMAL HIGH (ref 17–63)
AST: 50 U/L — ABNORMAL HIGH (ref 15–41)
Albumin: 5.1 g/dL — ABNORMAL HIGH (ref 3.5–5.0)
Alkaline Phosphatase: 212 U/L — ABNORMAL HIGH (ref 38–126)
Bilirubin, Direct: 0.2 mg/dL (ref 0.1–0.5)
Indirect Bilirubin: 1.3 mg/dL — ABNORMAL HIGH (ref 0.3–0.9)
Total Bilirubin: 1.5 mg/dL — ABNORMAL HIGH (ref 0.3–1.2)
Total Protein: 8.7 g/dL — ABNORMAL HIGH (ref 6.5–8.1)

## 2015-04-07 LAB — URINALYSIS COMPLETE WITH MICROSCOPIC (ARMC ONLY)
Bacteria, UA: NONE SEEN
Bilirubin Urine: NEGATIVE
Glucose, UA: >500 mg/dL — AB
Hgb urine dipstick: NEGATIVE
Leukocytes, UA: NEGATIVE
Nitrite: NEGATIVE
Protein, ur: NEGATIVE mg/dL
RBC / HPF: NONE SEEN RBC/hpf (ref 0–5)
Specific Gravity, Urine: 1.029 (ref 1.005–1.030)
Squamous Epithelial / LPF: NONE SEEN
WBC, UA: NONE SEEN WBC/hpf (ref 0–5)
pH: 5 (ref 5.0–8.0)

## 2015-04-07 LAB — BLOOD GAS, VENOUS
Acid-base deficit: 8.3 mmol/L — ABNORMAL HIGH (ref 0.0–2.0)
Bicarbonate: 18.4 mEq/L — ABNORMAL LOW (ref 21.0–28.0)
FIO2: 0.21
Patient temperature: 37
pCO2, Ven: 41 mmHg — ABNORMAL LOW (ref 44.0–60.0)
pH, Ven: 7.26 — ABNORMAL LOW (ref 7.320–7.430)

## 2015-04-07 MED ORDER — SODIUM CHLORIDE 0.9 % IV SOLN
Freq: Once | INTRAVENOUS | Status: AC
  Administered 2015-04-07: 1000 mL via INTRAVENOUS

## 2015-04-07 MED ORDER — NICOTINE 21 MG/24HR TD PT24
21.0000 mg | MEDICATED_PATCH | Freq: Every day | TRANSDERMAL | Status: DC
  Administered 2015-04-07 – 2015-04-08 (×2): 21 mg via TRANSDERMAL
  Filled 2015-04-07 (×2): qty 1

## 2015-04-07 MED ORDER — SODIUM CHLORIDE 0.9 % IV SOLN
INTRAVENOUS | Status: DC
  Administered 2015-04-07 (×2): via INTRAVENOUS

## 2015-04-07 MED ORDER — INSULIN REGULAR HUMAN 100 UNIT/ML IJ SOLN
INTRAMUSCULAR | Status: DC
  Administered 2015-04-07: 3.3 [IU]/h via INTRAVENOUS
  Filled 2015-04-07: qty 2.5

## 2015-04-07 MED ORDER — PROMETHAZINE HCL 25 MG/ML IJ SOLN
25.0000 mg | Freq: Four times a day (QID) | INTRAMUSCULAR | Status: DC | PRN
  Administered 2015-04-07: 25 mg via INTRAVENOUS
  Filled 2015-04-07: qty 1

## 2015-04-07 MED ORDER — ONDANSETRON HCL 4 MG/2ML IJ SOLN
4.0000 mg | Freq: Four times a day (QID) | INTRAMUSCULAR | Status: DC | PRN
  Administered 2015-04-07 (×2): 4 mg via INTRAVENOUS
  Filled 2015-04-07 (×2): qty 2

## 2015-04-07 MED ORDER — DEXTROSE-NACL 5-0.45 % IV SOLN
INTRAVENOUS | Status: DC
  Administered 2015-04-08 (×2): via INTRAVENOUS

## 2015-04-07 MED ORDER — ENOXAPARIN SODIUM 40 MG/0.4ML ~~LOC~~ SOLN
40.0000 mg | SUBCUTANEOUS | Status: DC
  Administered 2015-04-07: 40 mg via SUBCUTANEOUS
  Filled 2015-04-07: qty 0.4

## 2015-04-07 MED ORDER — DIPHENHYDRAMINE HCL 25 MG PO CAPS: 25.0000 mg | ORAL_CAPSULE | Freq: Four times a day (QID) | ORAL | Status: DC | PRN

## 2015-04-07 MED ORDER — ONDANSETRON HCL 4 MG/2ML IJ SOLN
4.0000 mg | Freq: Once | INTRAMUSCULAR | Status: AC
  Administered 2015-04-07: 4 mg via INTRAVENOUS
  Filled 2015-04-07: qty 2

## 2015-04-07 MED ORDER — SODIUM CHLORIDE 0.9 % IV SOLN
Freq: Once | INTRAVENOUS | Status: AC
  Administered 2015-04-07: 09:00:00 via INTRAVENOUS

## 2015-04-07 MED ORDER — SODIUM CHLORIDE 0.9 % IV SOLN
INTRAVENOUS | Status: DC
  Administered 2015-04-07: 3.3 [IU]/h via INTRAVENOUS
  Administered 2015-04-08: 05:00:00 via INTRAVENOUS
  Filled 2015-04-07 (×2): qty 2.5

## 2015-04-07 MED ORDER — MUPIROCIN 2 % EX OINT
1.0000 "application " | TOPICAL_OINTMENT | Freq: Two times a day (BID) | CUTANEOUS | Status: DC
  Administered 2015-04-07 – 2015-04-08 (×2): 1 via NASAL
  Filled 2015-04-07 (×2): qty 22

## 2015-04-07 MED ORDER — SODIUM CHLORIDE 0.9 % IV SOLN: INTRAVENOUS | Status: AC

## 2015-04-07 MED ORDER — OXYCODONE HCL 5 MG PO TABS
5.0000 mg | ORAL_TABLET | Freq: Four times a day (QID) | ORAL | Status: DC | PRN
  Administered 2015-04-07 – 2015-04-08 (×3): 5 mg via ORAL
  Filled 2015-04-07 (×3): qty 1

## 2015-04-07 MED ORDER — OXYCODONE-ACETAMINOPHEN 5-325 MG PO TABS
1.0000 | ORAL_TABLET | Freq: Four times a day (QID) | ORAL | Status: DC | PRN
  Administered 2015-04-07: 1 via ORAL
  Filled 2015-04-07: qty 1

## 2015-04-07 MED ORDER — POTASSIUM CHLORIDE 10 MEQ/100ML IV SOLN
10.0000 meq | INTRAVENOUS | Status: AC
  Administered 2015-04-07 (×2): 10 meq via INTRAVENOUS
  Filled 2015-04-07 (×2): qty 100

## 2015-04-07 NOTE — ED Provider Notes (Addendum)
[facility] Emergency Department Provider Note     Time seen: ----------------------------------------- 9:01 AM on 04/07/2015 -----------------------------------------    I have reviewed the triage vital signs and the nursing notes.   HISTORY  Chief Complaint Hyperglycemia    HPI Trevor Brown is a 24 y.o. male who presents ER having run out of his insulin for 3 days. Patient is type I diabetic blood sugars over 600. He is feeling weak with nausea vomiting and diarrhea. States he feels like he is given a pass out, denies any other complaints. Patient is not sure if he has been in DKA before. Symptoms have progressively gotten worse over the last 3 days.   Past Medical History  Diagnosis Date  . Diabetes mellitus without complication   . Heart attack     Patient Active Problem List   Diagnosis Date Noted  . Hepatitis C 12/13/2014  . Diabetes type 1, uncontrolled 12/11/2014  . Major depressive disorder, single episode, mild   . Major depression, single episode 12/10/2014  . Diabetes mellitus type 1, uncontrolled 12/09/2014  . Transaminitis 12/09/2014  . Folliculitis 12/09/2014  . Homelessness 12/09/2014    No past surgical history on file.  Allergies Bee venom; Penicillins; and Tramadol  Social History Social History  Substance Use Topics  . Smoking status: Current Every Day Smoker -- 1.00 packs/day  . Smokeless tobacco: Not on file  . Alcohol Use: No    Review of Systems Constitutional: Negative for fever. Eyes: Negative for visual changes. ENT: Negative for sore throat. Cardiovascular: Negative for chest pain. Respiratory: Negative for shortness of breath. Gastrointestinal: Positive for nausea and vomiting and diarrhea Genitourinary: Negative for dysuria. Musculoskeletal: Negative for back pain. Skin: Negative for rash. Neurological: Negative for headaches, positive for weakness  10-point ROS otherwise  negative.  ____________________________________________   PHYSICAL EXAM:  VITAL SIGNS: ED Triage Vitals  Enc Vitals Group     BP 04/07/15 0845 128/70 mmHg     Pulse Rate 04/07/15 0845 99     Resp 04/07/15 0845 18     Temp 04/07/15 0845 97.6 F (36.4 C)     Temp Source 04/07/15 0845 Oral     SpO2 04/07/15 0845 100 %     Weight 04/07/15 0845 120 lb (54.432 kg)     Height 04/07/15 0845 5\' 4"  (1.626 m)     Head Cir --      Peak Flow --      Pain Score 04/07/15 0846 10     Pain Loc --      Pain Edu? --      Excl. in GC? --     Constitutional: Alert and oriented. Mild distress Eyes: Conjunctivae are normal. PERRL. Normal extraocular movements. ENT   Head: Normocephalic and atraumatic.   Nose: No congestion/rhinnorhea.   Mouth/Throat: Mucous membranes are moist.   Neck: No stridor. Cardiovascular: Normal rate, regular rhythm. Normal and symmetric distal pulses are present in all extremities. No murmurs, rubs, or gallops. Respiratory: Normal respiratory effort without tachypnea nor retractions. Breath sounds are clear and equal bilaterally. No wheezes/rales/rhonchi. Gastrointestinal: Soft and nontender. No distention. No abdominal bruits.  Musculoskeletal: Nontender with normal range of motion in all extremities. No joint effusions.  No lower extremity tenderness nor edema. Neurologic:  Normal speech and language. No gross focal neurologic deficits are appreciated. Speech is normal. No gait instability. Skin:  Skin is warm, dry and intact. No rash noted. Psychiatric: Mood and affect are normal. Speech and  behavior are normal. Patient exhibits appropriate insight and judgment. ____________________________________________  EKG: Interpreted by me. Normal sinus rhythm rate of 64 bpm, normal PR interval, normal QRS with, normal QT interval. Borderline hyperacute T waves.  ____________________________________________  ED COURSE:  Pertinent labs & imaging results that  were available during my care of the patient were reviewed by me and considered in my medical decision making (see chart for details). Patient is in mild distress, likely mild DKA. ____________________________________________    LABS (pertinent positives/negatives)  Labs Reviewed  BASIC METABOLIC PANEL - Abnormal; Notable for the following:    Sodium 132 (*)    Chloride 90 (*)    CO2 20 (*)    Glucose, Bld 666 (*)    Creatinine, Ser 1.32 (*)    Anion gap 22 (*)    All other components within normal limits  BLOOD GAS, VENOUS - Abnormal; Notable for the following:    pH, Ven 7.26 (*)    pCO2, Ven 41 (*)    Bicarbonate 18.4 (*)    Acid-base deficit 8.3 (*)    All other components within normal limits  GLUCOSE, CAPILLARY - Abnormal; Notable for the following:    Glucose-Capillary >600 (*)    All other components within normal limits  CBC  URINALYSIS COMPLETEWITH MICROSCOPIC ([facility] ONLY)  URINE DRUG SCREEN, QUALITATIVE ([facility] ONLY)  HEPATIC FUNCTION PANEL  CBG MONITORING, ED   CRITICAL CARE Performed by: Emily Filbert    Total critical care time: 30 minutes  Critical care time was exclusive of separately billable procedures and treating other patients.  Critical care was necessary to treat or prevent imminent or life-threatening deterioration.  Critical care was time spent personally by me on the following activities: development of treatment plan with patient and/or surrogate as well as nursing, discussions with consultants, evaluation of patient's response to treatment, examination of patient, obtaining history from patient or surrogate, ordering and performing treatments and interventions, ordering and review of laboratory studies, ordering and review of radiographic studies, pulse oximetry and re-evaluation of patient's condition. ___________________________________________  FINAL ASSESSMENT AND PLAN  Hyperglycemia, medication noncompliance, nausea vomiting and  diarrhea, mild DKA  Plan: Patient with labs and imaging as dictated above. Patient likely mild DKA. He'll be started on insulin drip. He is noncompliant with his insulin regimen, will need to be admitted the hospital to ensure non-ketosis and symptom resolution.   Emily Filbert, MD 04/07/15 1120  Emily Filbert, MD 04/07/15 [phone]

## 2015-04-07 NOTE — Progress Notes (Signed)
Inpatient Diabetes Program Recommendations  AACE/ADA: New Consensus Statement on Inpatient Glycemic Control (2015)  Target Ranges:  Prepandial:   less than 140 mg/dL      Peak postprandial:   less than 180 mg/dL (1-2 hours)      Critically ill patients:  140 - 180 mg/dL   Review of Glycemic Control  Diabetes history: Type 1 Outpatient Diabetes medications: Lantus 20 units qhs, Novolog 5 units tid Current orders for Inpatient glycemic control: in begin insulin infusion  Inpatient Diabetes Program Recommendations: Diagnosed DKA- recommend DKA - Phase 1 order set.  Susette Racer, RN, BA, MHA, CDE Diabetes Coordinator Inpatient Diabetes Program  [phone] (Team Pager) [phone] [facility] Office) 04/07/2015 12:37 PM

## 2015-04-07 NOTE — H&P (Signed)
[facility] [place] at [facility]   PATIENT NAME: Trevor Brown    MR#:  [id number]  DATE OF BIRTH:  [birth year]  DATE OF ADMISSION:  04/07/2015  PRIMARY CARE PHYSICIAN: Pcp Not In System   REQUESTING/REFERRING PHYSICIAN: Dr. Chrissie Noa  CHIEF COMPLAINT:   Chief Complaint  Patient presents with  . Hyperglycemia    HISTORY OF PRESENT ILLNESS: Trevor Brown  is a 24 y.o. male with a known history of diabetes, he was admitted in hospital in May 2016 and was given prescriptions for his insulin on discharge. He did not go to any primary care physician's office since then, and so last week he ran out of his insulin and also his glucometer. For last 4 days he has not taken his insulin, and started having vomiting, diarrhea, abdominal pain for last 2 days. There was no blood in any of them, but he was not able to eat or drink anything, so came to emergency room. Noted to have blood sugar level more than 600, slight acidosis- so started on initially and IV drip and given to hospitalist team.  PAST MEDICAL HISTORY:   Past Medical History  Diagnosis Date  . Diabetes mellitus without complication   . Hepatitis C, acute may 2016  . Heart attack     Pt claims he had heart attack a year ago ( 2015)- and was admitted in [facility] for that, but not given any meds or angiogram, on review of chart- I could not find any details like that.    PAST SURGICAL HISTORY: No past surgical history on file.  SOCIAL HISTORY:  Social History  Substance Use Topics  . Smoking status: Current Every Day Smoker -- 1.00 packs/day  . Smokeless tobacco: Not on file  . Alcohol Use: No    FAMILY HISTORY:  Family History  Problem Relation Age of Onset  . Cirrhosis Mother   . Diabetes Mellitus II Maternal Grandmother     DRUG ALLERGIES:  Allergies  Allergen Reactions  . Bee Venom Anaphylaxis  . Penicillins Anaphylaxis and Hives  . Bee Pollen   . Ibuprofen Other (See Comments)    Stomach upset   . Tramadol Hives and Swelling    REVIEW OF SYSTEMS:   CONSTITUTIONAL: No fever, fatigue or weakness.  EYES: No blurred or double vision.  EARS, NOSE, AND THROAT: No tinnitus or ear pain.  RESPIRATORY: No cough, shortness of breath, wheezing or hemoptysis.  CARDIOVASCULAR: No chest pain, orthopnea, edema.  GASTROINTESTINAL: positive for all nausea, vomiting, diarrhea and abdominal pain.  GENITOURINARY: No dysuria, hematuria.  ENDOCRINE: No polyuria, nocturia,  HEMATOLOGY: No anemia, easy bruising or bleeding SKIN: No rash or lesion. MUSCULOSKELETAL: No joint pain or arthritis.   NEUROLOGIC: No tingling, numbness, weakness.  PSYCHIATRY: No anxiety or depression.   MEDICATIONS AT HOME:  Prior to Admission medications   Medication Sig Start Date End Date Taking? Authorizing Provider  diphenhydrAMINE (BENADRYL) 25 mg capsule Take 1 capsule (25 mg total) by mouth every 4 (four) hours as needed. 03/13/15 03/12/16 Yes Joni Reining, PA-C  oxyCODONE-acetaminophen (ROXICET) 5-325 MG per tablet Take 1 tablet by mouth every 4 (four) hours as needed for moderate pain. 03/13/15  Yes Joni Reining, PA-C  insulin aspart (NOVOLOG) 100 UNIT/ML injection Inject 5 Units into the skin 3 (three) times daily with meals. 02/07/15 02/07/16  Myrna Blazer, MD  insulin glargine (LANTUS) 100 UNIT/ML injection Inject 0.2 mLs (20 Units total) into the skin at bedtime.  02/07/15 02/07/16  Myrna Blazer, MD      PHYSICAL EXAMINATION:   VITAL SIGNS: Blood pressure 131/63, pulse 85, temperature 97.6 F (36.4 C), temperature source Oral, resp. rate 22, height 5\' 4"  (1.626 m), weight 54.432 kg (120 lb), SpO2 100 %.  GENERAL:  24 y.o.-year-old patient lying in the bed with no acute distress.  EYES: Pupils equal, round, reactive to light and accommodation. No scleral icterus. Extraocular muscles intact.  HEENT: Head atraumatic, normocephalic. Oropharynx and nasopharynx clear. Mucosa dry. Poor oral  hyegine. NECK:  Supple, no jugular venous distention. No thyroid enlargement, no tenderness.  LUNGS: Normal breath sounds bilaterally, no wheezing, rales,rhonchi or crepitation. No use of accessory muscles of respiration.  CARDIOVASCULAR: S1, S2 normal. No murmurs, rubs, or gallops.  ABDOMEN: Soft, nontender, nondistended. Bowel sounds present. No organomegaly or mass.  EXTREMITIES: No pedal edema, cyanosis, or clubbing.  NEUROLOGIC: Cranial nerves II through XII are intact. Muscle strength 5/5 in all extremities. Sensation intact. Gait not checked.  PSYCHIATRIC: The patient is alert and oriented x 3.  SKIN: No obvious rash, lesion, or ulcer.   LABORATORY PANEL:   CBC  Recent Labs Lab 04/07/15 0901  WBC 10.0  HGB 15.1  HCT 45.9  PLT 254  MCV 90.9  MCH 29.9  MCHC 32.9  RDW 12.2   ------------------------------------------------------------------------------------------------------------------  Chemistries   Recent Labs Lab 04/07/15 0901  NA 132*  K 4.7  CL 90*  CO2 20*  GLUCOSE 666*  BUN 19  CREATININE 1.32*  CALCIUM 9.9   ------------------------------------------------------------------------------------------------------------------ estimated creatinine clearance is 67.5 mL/min (by C-G formula based on Cr of 1.32). ------------------------------------------------------------------------------------------------------------------ No results for input(s): TSH, T4TOTAL, T3FREE, THYROIDAB in the last 72 hours.  Invalid input(s): FREET3   Coagulation profile No results for input(s): INR, PROTIME in the last 168 hours. ------------------------------------------------------------------------------------------------------------------- No results for input(s): DDIMER in the last 72 hours. -------------------------------------------------------------------------------------------------------------------  Cardiac Enzymes No results for input(s): CKMB, TROPONINI,  MYOGLOBIN in the last 168 hours.  Invalid input(s): CK ------------------------------------------------------------------------------------------------------------------ Invalid input(s): POCBNP  ---------------------------------------------------------------------------------------------------------------  Urinalysis    Component Value Date/Time   COLORURINE STRAW* 02/07/2015 1155   COLORURINE Yellow 04/06/2014 0751   APPEARANCEUR CLEAR* 02/07/2015 1155   APPEARANCEUR Clear 04/06/2014 0751   LABSPEC 1.029 02/07/2015 1155   LABSPEC 1.026 04/06/2014 0751   PHURINE 7.0 02/07/2015 1155   PHURINE 6.0 04/06/2014 0751   GLUCOSEU >500* 02/07/2015 1155   GLUCOSEU >=500 04/06/2014 0751   HGBUR NEGATIVE 02/07/2015 1155   HGBUR Negative 04/06/2014 0751   BILIRUBINUR NEGATIVE 02/07/2015 1155   BILIRUBINUR Negative 04/06/2014 0751   KETONESUR NEGATIVE 02/07/2015 1155   KETONESUR Trace 04/06/2014 0751   PROTEINUR NEGATIVE 02/07/2015 1155   PROTEINUR Negative 04/06/2014 0751   NITRITE NEGATIVE 02/07/2015 1155   NITRITE Negative 04/06/2014 0751   LEUKOCYTESUR NEGATIVE 02/07/2015 1155   LEUKOCYTESUR Negative 04/06/2014 0751     RADIOLOGY: No results found.   IMPRESSION AND PLAN:  * DKA  Admit with insulin IV drip, monitor in stepdown unit.  History and out of his insulin, but now have a primary care physician's appointment next week.  Currently we will keep on clear liquid diet without any sugars.  * Nausea and vomiting  We will give Zofran IV as needed.  * Smoking  Counseled to quit smoking for 4 minutes, offered nicotine patch he agreed to use that in hospital.  * Tooth pain He takes Percocet at home, I will continue that.  Has poor oral hygiene, and was  advised to go to a dentist as outpatient after discharge last time, but he did not follow up.  * History of hepatitis C  Interim admission in May 2016- he was diagnosed with acute hepatitis C  I will check his hepatitic  panel at this time.  All the records are reviewed and case discussed with ED provider. Management plans discussed with the patient, family and they are in agreement.  CODE STATUS: Full  TOTAL CRITICAL CARE TIME TAKING CARE OF THIS PATIENT: 50 minutes.    Altamese Dilling M.D on 04/07/2015   Between 7am to 6pm - Pager - [phone]  After 6pm go to [email or web address] - password [email or web address]  [facility]  Office  [phone]  CC: Primary care physician; Pcp Not In System

## 2015-04-07 NOTE — ED Notes (Signed)
Pt to triage with reports of being out of insulin x 3 days. Pt is type 1 DM, BG over 600 in triage. Pt reports feeling weak with nausea and vomiting.

## 2015-04-08 DIAGNOSIS — E440 Moderate protein-calorie malnutrition: Secondary | ICD-10-CM | POA: Insufficient documentation

## 2015-04-08 LAB — GLUCOSE, CAPILLARY
Glucose-Capillary: 105 mg/dL — ABNORMAL HIGH (ref 65–99)
Glucose-Capillary: 111 mg/dL — ABNORMAL HIGH (ref 65–99)
Glucose-Capillary: 115 mg/dL — ABNORMAL HIGH (ref 65–99)
Glucose-Capillary: 160 mg/dL — ABNORMAL HIGH (ref 65–99)
Glucose-Capillary: 168 mg/dL — ABNORMAL HIGH (ref 65–99)
Glucose-Capillary: 182 mg/dL — ABNORMAL HIGH (ref 65–99)
Glucose-Capillary: 201 mg/dL — ABNORMAL HIGH (ref 65–99)
Glucose-Capillary: 217 mg/dL — ABNORMAL HIGH (ref 65–99)
Glucose-Capillary: 65 mg/dL (ref 65–99)
Glucose-Capillary: 69 mg/dL (ref 65–99)
Glucose-Capillary: 78 mg/dL (ref 65–99)
Glucose-Capillary: 98 mg/dL (ref 65–99)

## 2015-04-08 LAB — BASIC METABOLIC PANEL
Anion gap: 4 — ABNORMAL LOW (ref 5–15)
BUN: 23 mg/dL — ABNORMAL HIGH (ref 6–20)
CO2: 23 mmol/L (ref 22–32)
Calcium: 9.3 mg/dL (ref 8.9–10.3)
Chloride: 113 mmol/L — ABNORMAL HIGH (ref 101–111)
Creatinine, Ser: 0.9 mg/dL (ref 0.61–1.24)
GFR calc Af Amer: >60 mL/min (ref >60)
GFR calc non Af Amer: >60 mL/min (ref >60)
Glucose, Bld: 135 mg/dL — ABNORMAL HIGH (ref 65–99)
Potassium: 3.9 mmol/L (ref 3.5–5.1)
Sodium: 140 mmol/L (ref 135–145)

## 2015-04-08 MED ORDER — INSULIN ASPART 100 UNIT/ML ~~LOC~~ SOLN: 0.0000 [IU] | Freq: Three times a day (TID) | SUBCUTANEOUS | Status: DC

## 2015-04-08 MED ORDER — INSULIN GLARGINE 100 UNIT/ML ~~LOC~~ SOLN: 20.0000 [IU] | Freq: Every day | SUBCUTANEOUS | Status: DC

## 2015-04-08 MED ORDER — INSULIN GLARGINE 100 UNIT/ML ~~LOC~~ SOLN: 20.0000 [IU] | SUBCUTANEOUS | Status: DC

## 2015-04-08 MED ORDER — INSULIN GLARGINE 100 UNIT/ML ~~LOC~~ SOLN
20.0000 [IU] | SUBCUTANEOUS | Status: DC
  Administered 2015-04-08: 20 [IU] via SUBCUTANEOUS
  Filled 2015-04-08 (×2): qty 0.2

## 2015-04-08 MED ORDER — INSULIN ASPART 100 UNIT/ML ~~LOC~~ SOLN
4.0000 [IU] | Freq: Three times a day (TID) | SUBCUTANEOUS | Status: DC
  Administered 2015-04-08: 4 [IU] via SUBCUTANEOUS
  Filled 2015-04-08: qty 4

## 2015-04-08 MED ORDER — INSULIN ASPART 100 UNIT/ML ~~LOC~~ SOLN: 0.0000 [IU] | Freq: Every day | SUBCUTANEOUS | Status: DC

## 2015-04-08 MED ORDER — INSULIN ASPART 100 UNIT/ML ~~LOC~~ SOLN
0.0000 [IU] | Freq: Three times a day (TID) | SUBCUTANEOUS | Status: DC
  Administered 2015-04-08: 3 [IU] via SUBCUTANEOUS
  Administered 2015-04-08: 5 [IU] via SUBCUTANEOUS
  Filled 2015-04-08: qty 2
  Filled 2015-04-08: qty 4
  Filled 2015-04-08: qty 3

## 2015-04-08 MED ORDER — INSULIN ASPART 100 UNIT/ML ~~LOC~~ SOLN: 5.0000 [IU] | Freq: Three times a day (TID) | SUBCUTANEOUS | Status: DC

## 2015-04-08 NOTE — Discharge Instructions (Addendum)
Follow with PMD in 1 week, and use your insulin regularly. Increase activity slowly and make sure to get your insulin from pharmacy.  DO NOT take Lantus insulin tonight. Start tomorrow night Sunday April 09, 2015.

## 2015-04-08 NOTE — Progress Notes (Signed)
Patient had low blood sugar of 65 and he was given orange juice to drink. Notified Dr. Elisabeth Pigeon of patients low sugar and he stated that if it were to improve that patient may be discharged home. Blood sugar was rechecked and it was 111 and he was ready to go home. Dr. Elisabeth Pigeon requested that I instruct patient to hold Lantus insulin tonight and start tomorrow night. Bilateral IV's removed, catheters intact, dressing applied. Reviewed patients discharge instructions and stressed the importance of taking his insulin and obtaining strips to monitor his levels. Told him that he should NOT take the Lantus insulin tonight and start tomorrow night. Prescriptions were sent electronically to [facility] and let him know that. Also stressed the importance of having his follow up appointment with a primary care physician and also with a dentist for his oral pain. Patients ride present and he walked out instead of waiting for wheelchair.

## 2015-04-08 NOTE — Care Management Note (Addendum)
Case Management Note  Patient Details  Name: Trevor Brown MRN: [id number] Date of Birth: 10-20-1990  Subjective/Objective:     Trevor Brown was provided with a referral to [facility] in May 2016 and Lorrie agreed to assist Trevor Brown to access dental care in the community.  Trevor Brown did not follow up with [facility] at that time. Today this Clinical research associate provided Trevor Brown with another application to [facility] and strongly encouraged him to go to [facility]  where he can see a physician, obtain no cost or low cost medications, and possibly receive a referral to charity dental services. Trevor Brown made no eye contact with this Clinical research associate and his communication consisted of grunting noises. According to his ICU nurse, Trevor Brown was quite animated and vocal yesterday. This Clinical research associate updated ICU nurse Ms Freida Busman about Trevor Brown affect today and that he has been provided with an application to [facility] and was given a generic medication discount card.            Action/Plan:   Expected Discharge Date:                  Expected Discharge Plan:     In-House Referral:     Discharge planning Services     Post Acute Care Choice:    Choice offered to:     DME Arranged:    DME Agency:     HH Arranged:    HH Agency:     Status of Service:     Medicare Important Message Given:    Date Medicare IM Given:    Medicare IM give by:    Date Additional Medicare IM Given:    Additional Medicare Important Message give by:     If discussed at Long Length of Stay Meetings, dates discussed:    Additional Comments:  Rockett,Marilyn A, RN 04/08/2015, 1:34 PM

## 2015-04-08 NOTE — Progress Notes (Signed)
Initial Nutrition Assessment  DOCUMENTATION CODES:   Non-severe (moderate) malnutrition in context of chronic illness  INTERVENTION:  Meals and snacks: Cater to pt preferences once started on po diet.  Recommend carb modified diet Medical Nutrition Therapy: Recommend glucerna BID for added nutrition once on po diet Nutrition diet education: Tried to discuss diabetic diet, pt not interested this am.  "I have already been to the classes, I was just out of my insulin."   NUTRITION DIAGNOSIS:   Predicted suboptimal nutrient intake related to chronic illness, mouth pain as evidenced by percent weight loss.    GOAL:   Patient will meet greater than or equal to 90% of their needs    MONITOR:    (Energy intake, Glucose profile, Electrolyte and renal profile)  REASON FOR ASSESSMENT:   Malnutrition Screening Tool    ASSESSMENT:      Pt admitted with DKA, nausea, vomiting for 2 days prior to admission. Out of insulin times 4 days.  Mouth pain from poor dentition and positive for cocaine and marijuana this admission  Past Medical History  Diagnosis Date  . Diabetes mellitus without complication   . Hepatitis C, acute may 2016  . Heart attack     Pt claims he had heart attack a year ago ( 2015)- and was admitted in [facility] for that, but not given any meds or angiogram, on review of chart- I could not find any details like that.    Current Nutrition: NPO  Food/Nutrition-Related History: pt reports "I eat." Did not want to discuss with this writer this am.  Question accuracy of pt report based off physical exam and wt loss   Medications: off insulin drip  Electrolyte/Renal Profile and Glucose Profile:   Recent Labs Lab 04/07/15 1737 04/07/15 2147 04/08/15 0243  NA 135 140 140  K 5.5* 3.6 3.9  CL 103 110 113*  CO2 11* 13* 23  BUN 22* 24* 23*  CREATININE 1.24 1.37* 0.90  CALCIUM 9.4 9.4 9.3  GLUCOSE 463* 313* 135*   Protein Profile:  Recent Labs Lab 04/07/15 0901   ALBUMIN 5.1*    Gastrointestinal Profile: Last BM: PTA   Nutrition-Focused Physical Exam Findings: Nutrition-Focused physical exam completed. Findings are mild/moderate fat depletion, mild/moderate muscle depletion, and none edema.     Weight Change: 10% weight loss in the last month per wt encounters    Diet Order:  Diet Carb Modified Fluid consistency:: Thin; Room service appropriate?: Yes  Skin:   reviewed   Height:   Ht Readings from Last 1 Encounters:  04/07/15  (1.626 m)    Weight:   Wt Readings from Last 1 Encounters:  04/07/15 108 lb 11 oz (49.3 kg)     BMI:  Body mass index is 18.65 kg/(m^2).  Estimated Nutritional Needs:   Kcal:  BEE 1401 kcals (IF 1.0, AF 1.3) 1821 kcals/d  Protein:  (1.1-1.3 g/kg) 54-64g/d  Fluid:  (30-9ml/kg) 1470-1715 ml/d  EDUCATION NEEDS:   No education needs identified at this time  MODERATE Care Level   Joli B. Freida Busman, RD, LDN [phone] (pager)

## 2015-04-08 NOTE — Discharge Summary (Signed)
[facility] - [place] at [facility]   PATIENT NAME: Trevor Brown    MR#:  [id number]  DATE OF BIRTH:  [birth year]  DATE OF ADMISSION:  04/07/2015 ADMITTING PHYSICIAN: Altamese Dilling, MD  DATE OF DISCHARGE: 04/08/2015  PRIMARY CARE PHYSICIAN: Pcp Not In System    ADMISSION DIAGNOSIS:  Hyperglycemia [R73.9] Vomiting and diarrhea [R11.10, R19.7] H/O medication noncompliance [Z91.19] Diabetic ketoacidosis without coma associated with type 1 diabetes mellitus [E10.10]  DISCHARGE DIAGNOSIS:   Diabetic ketoacidosis.  SECONDARY DIAGNOSIS:   Past Medical History  Diagnosis Date  . Diabetes mellitus without complication   . Hepatitis C, acute may 2016  . Heart attack     Pt claims he had heart attack a year ago ( 2015)- and was admitted in [facility] for that, but not given any meds or angiogram, on review of chart- I could not find any details like that.    HOSPITAL COURSE:   * DKA Admitted with insulin IV drip, monitored in stepdown unit. He ran out of his insulin, but now have a primary care physician's appointment next week.  tolerated diet well.  He confirms that he have some insurance coverage, and will be able to afford insulin , once he have prescriptions.   I gave prescription for 1 month. He have appointment next week with PMD.   I again councelled at length about importance of taking insulin regularly and following with PMD.  * Nausea and vomiting phenergan and Zofran IV as needed.  * Smoking Counseled to quit smoking for 4 minutes, offered nicotine patch he agreed to use that in hospital.   * Tooth pain  He takes Percocet at home, I will continue that. Has poor oral hygiene, and was advised to go to a dentist as outpatient after discharge last time, but he did not follow up. Advised again to follow with dentist.  * History of hepatitis C Interim admission in May 2016- he was diagnosed with acute hepatitis C normal hepatic  function panel at this time.   DISCHARGE CONDITIONS:   Stable.  CONSULTS OBTAINED:     DRUG ALLERGIES:   Allergies  Allergen Reactions  . Bee Venom Anaphylaxis  . Penicillins Anaphylaxis and Hives  . Bee Pollen   . Ibuprofen Other (See Comments)    Stomach upset  . Tramadol Hives and Swelling    DISCHARGE MEDICATIONS:   Current Discharge Medication List    CONTINUE these medications which have CHANGED   Details  insulin aspart (NOVOLOG) 100 UNIT/ML injection Inject 5 Units into the skin 3 (three) times daily with meals. Qty: 10 mL, Refills: 0    insulin glargine (LANTUS) 100 UNIT/ML injection Inject 0.2 mLs (20 Units total) into the skin at bedtime. Qty: 10 mL, Refills: 0      CONTINUE these medications which have NOT CHANGED   Details  diphenhydrAMINE (BENADRYL) 25 mg capsule Take 1 capsule (25 mg total) by mouth every 4 (four) hours as needed. Qty: 30 capsule, Refills: 0    oxyCODONE-acetaminophen (ROXICET) 5-325 MG per tablet Take 1 tablet by mouth every 4 (four) hours as needed for moderate pain. Qty: 8 tablet, Refills: 0         DISCHARGE INSTRUCTIONS:   folow regular with PMD.  If you experience worsening of your admission symptoms, develop shortness of breath, life threatening emergency, suicidal or homicidal thoughts you must seek medical attention immediately by calling 911 or calling your MD immediately  if symptoms less severe.  You Must read complete instructions/literature along with all the possible adverse reactions/side effects for all the Medicines you take and that have been prescribed to you. Take any new Medicines after you have completely understood and accept all the possible adverse reactions/side effects.   Please note  You were cared for by a hospitalist during your hospital stay. If you have any questions about your discharge medications or the care you received while you were in the hospital after you are discharged, you can call the  unit and asked to speak with the hospitalist on call if the hospitalist that took care of you is not available. Once you are discharged, your primary care physician will handle any further medical issues. Please note that NO REFILLS for any discharge medications will be authorized once you are discharged, as it is imperative that you return to your primary care physician (or establish a relationship with a primary care physician if you do not have one) for your aftercare needs so that they can reassess your need for medications and monitor your lab values.    Today   CHIEF COMPLAINT:   Chief Complaint  Patient presents with  . Hyperglycemia    HISTORY OF PRESENT ILLNESS:  Trevor Brown  is a 24 y.o. male with a known history of diabetes, he was admitted in hospital in May 2016 and was given prescriptions for his insulin on discharge. He did not go to any primary care physician's office since then, and so last week he ran out of his insulin and also his glucometer. For last 4 days he has not taken his insulin, and started having vomiting, diarrhea, abdominal pain for last 2 days. There was no blood in any of them, but he was not able to eat or drink anything, so came to emergency room. Noted to have blood sugar level more than 600, slight acidosis- so started on initially and IV drip and given to hospitalist team.   VITAL SIGNS:  Blood pressure 128/85, pulse 79, temperature 97.8 F (36.6 C), temperature source Oral, resp. rate 20, height  (1.626 m), weight 49.3 kg (108 lb 11 oz), SpO2 100 %.  I/O:   Intake/Output Summary (Last 24 hours) at 04/08/15 1113 Last data filed at 04/08/15 1015  Gross per 24 hour  Intake 2304.03 ml  Output   1930 ml  Net 374.03 ml    PHYSICAL EXAMINATION:   GENERAL: 24 y.o.-year-old patient lying in the bed with no acute distress.  EYES: Pupils equal, round, reactive to light and accommodation. No scleral icterus. Extraocular muscles intact.  HEENT:  Head atraumatic, normocephalic. Oropharynx and nasopharynx clear. Mucosa dry. Poor oral hyegine. NECK: Supple, no jugular venous distention. No thyroid enlargement, no tenderness.  LUNGS: Normal breath sounds bilaterally, no wheezing, rales,rhonchi or crepitation. No use of accessory muscles of respiration.  CARDIOVASCULAR: S1, S2 normal. No murmurs, rubs, or gallops.  ABDOMEN: Soft, nontender, nondistended. Bowel sounds present. No organomegaly or mass.  EXTREMITIES: No pedal edema, cyanosis, or clubbing.  NEUROLOGIC: Cranial nerves II through XII are intact. Muscle strength 5/5 in all extremities. Sensation intact. Gait not checked.  PSYCHIATRIC: The patient is alert and oriented x 3.  SKIN: No obvious rash, lesion, or ulcer.   DATA REVIEW:   CBC  Recent Labs Lab 04/07/15 0901  WBC 10.0  HGB 15.1  HCT 45.9  PLT 254    Chemistries   Recent Labs Lab 04/07/15 0901  04/08/15 0243  NA 132*  < >  140  K 4.7  < > 3.9  CL 90*  < > 113*  CO2 20*  < > 23  GLUCOSE 666*  < > 135*  BUN 19  < > 23*  CREATININE 1.32*  < > 0.90  CALCIUM 9.9  < > 9.3  AST 50*  --   --   ALT 67*  --   --   ALKPHOS 212*  --   --   BILITOT 1.5*  --   --   < > = values in this interval not displayed.  Cardiac Enzymes No results for input(s): TROPONINI in the last 168 hours.  Microbiology Results  Results for orders placed or performed during the hospital encounter of 04/07/15  MRSA PCR Screening     Status: Abnormal   Collection Time: 04/07/15  1:10 PM  Result Value Ref Range Status   MRSA by PCR POSITIVE (A) NEGATIVE Final    Comment: CRITICAL RESULT CALLED TO, READ BACK BY AND VERIFIED WITH:  PAMELA ALLEN AT 1635 04/07/15 SDR     RADIOLOGY:  No results found.  EKG:   Orders placed or performed during the hospital encounter of 04/07/15  . ED EKG  . ED EKG      Management plans discussed with the patient, family and they are in agreement.  CODE STATUS:     Code Status  Orders        Start     Ordered   04/07/15 1246  Full code   Continuous     04/07/15 1245      TOTAL TIME TAKING CARE OF THIS PATIENT: 40 minutes.    Altamese Dilling M.D on 04/08/2015 at 11:13 AM  Between 7am to 6pm - Pager - [phone]  After 6pm go to [email or web address] - password [email or web address]  [facility]  Office  [phone]  CC: Primary care physician; Pcp Not In System

## 2015-04-18 ENCOUNTER — Ambulatory Visit: Payer: Medicaid Other

## 2015-06-05 ENCOUNTER — Encounter: Payer: Self-pay | Admitting: Emergency Medicine

## 2015-06-05 ENCOUNTER — Emergency Department
Admission: EM | Admit: 2015-06-05 | Discharge: 2015-06-05 | Disposition: A | Discharge status: Home / Self Care | Payer: Medicaid Other | Attending: Emergency Medicine | Admitting: Emergency Medicine

## 2015-06-05 DIAGNOSIS — Z794 Long term (current) use of insulin: Secondary | ICD-10-CM | POA: Insufficient documentation

## 2015-06-05 DIAGNOSIS — F17210 Nicotine dependence, cigarettes, uncomplicated: Secondary | ICD-10-CM | POA: Insufficient documentation

## 2015-06-05 DIAGNOSIS — E1065 Type 1 diabetes mellitus with hyperglycemia: Secondary | ICD-10-CM | POA: Insufficient documentation

## 2015-06-05 DIAGNOSIS — K032 Erosion of teeth: Secondary | ICD-10-CM | POA: Insufficient documentation

## 2015-06-05 DIAGNOSIS — R739 Hyperglycemia, unspecified: Secondary | ICD-10-CM

## 2015-06-05 DIAGNOSIS — K029 Dental caries, unspecified: Secondary | ICD-10-CM | POA: Insufficient documentation

## 2015-06-05 DIAGNOSIS — Z880 Allergy status to penicillin: Secondary | ICD-10-CM | POA: Insufficient documentation

## 2015-06-05 DIAGNOSIS — L03112 Cellulitis of left axilla: Secondary | ICD-10-CM | POA: Insufficient documentation

## 2015-06-05 LAB — BLOOD GAS, ARTERIAL
Acid-base deficit: 7.1 mmol/L — ABNORMAL HIGH (ref 0.0–2.0)
Allens test (pass/fail): POSITIVE — AB
Bicarbonate: 17.9 mEq/L — ABNORMAL LOW (ref 21.0–28.0)
FIO2: 0.21
O2 Saturation: 97.3 %
Patient temperature: 37
pCO2 arterial: 34 mmHg (ref 32.0–48.0)
pH, Arterial: 7.33 — ABNORMAL LOW (ref 7.350–7.450)
pO2, Arterial: 100 mmHg (ref 83.0–108.0)

## 2015-06-05 LAB — URINALYSIS COMPLETE WITH MICROSCOPIC (ARMC ONLY)
Bacteria, UA: NONE SEEN
Bilirubin Urine: NEGATIVE
Glucose, UA: >500 mg/dL — AB
Hgb urine dipstick: NEGATIVE
Leukocytes, UA: NEGATIVE
Nitrite: NEGATIVE
Protein, ur: NEGATIVE mg/dL
Specific Gravity, Urine: 1.031 — ABNORMAL HIGH (ref 1.005–1.030)
Squamous Epithelial / LPF: NONE SEEN
WBC, UA: NONE SEEN WBC/hpf (ref 0–5)
pH: 5 (ref 5.0–8.0)

## 2015-06-05 LAB — CBC
HCT: 50.4 % (ref 40.0–52.0)
Hemoglobin: 16.7 g/dL (ref 13.0–18.0)
MCH: 29.4 pg (ref 26.0–34.0)
MCHC: 33.1 g/dL (ref 32.0–36.0)
MCV: 88.8 fL (ref 80.0–100.0)
Platelets: 216 10*3/uL (ref 150–440)
RBC: 5.67 MIL/uL (ref 4.40–5.90)
RDW: 12.9 % (ref 11.5–14.5)
WBC: 10.5 10*3/uL (ref 3.8–10.6)

## 2015-06-05 LAB — BASIC METABOLIC PANEL
Anion gap: 16 — ABNORMAL HIGH (ref 5–15)
Anion gap: 13 (ref 5–15)
BUN: 22 mg/dL — ABNORMAL HIGH (ref 6–20)
BUN: 22 mg/dL — ABNORMAL HIGH (ref 6–20)
CO2: 22 mmol/L (ref 22–32)
CO2: 19 mmol/L — ABNORMAL LOW (ref 22–32)
Calcium: 9.6 mg/dL (ref 8.9–10.3)
Calcium: 8.9 mg/dL (ref 8.9–10.3)
Chloride: 96 mmol/L — ABNORMAL LOW (ref 101–111)
Chloride: 99 mmol/L — ABNORMAL LOW (ref 101–111)
Creatinine, Ser: 1.01 mg/dL (ref 0.61–1.24)
Creatinine, Ser: 0.95 mg/dL (ref 0.61–1.24)
GFR calc Af Amer: >60 mL/min (ref >60)
GFR calc Af Amer: >60 mL/min (ref >60)
GFR calc non Af Amer: >60 mL/min (ref >60)
GFR calc non Af Amer: >60 mL/min (ref >60)
Glucose, Bld: 530 mg/dL (ref 65–99)
Glucose, Bld: 364 mg/dL — ABNORMAL HIGH (ref 65–99)
Potassium: 4.5 mmol/L (ref 3.5–5.1)
Potassium: 4 mmol/L (ref 3.5–5.1)
Sodium: 134 mmol/L — ABNORMAL LOW (ref 135–145)
Sodium: 131 mmol/L — ABNORMAL LOW (ref 135–145)

## 2015-06-05 LAB — GLUCOSE, CAPILLARY
Glucose-Capillary: 478 mg/dL — ABNORMAL HIGH (ref 65–99)
Glucose-Capillary: 267 mg/dL — ABNORMAL HIGH (ref 65–99)

## 2015-06-05 MED ORDER — CLINDAMYCIN HCL 150 MG PO CAPS: 450.0000 mg | ORAL_CAPSULE | Freq: Three times a day (TID) | ORAL | Status: DC

## 2015-06-05 MED ORDER — POTASSIUM CHLORIDE CRYS ER 20 MEQ PO TBCR
40.0000 meq | EXTENDED_RELEASE_TABLET | Freq: Two times a day (BID) | ORAL | Status: DC
  Administered 2015-06-05: 40 meq via ORAL
  Filled 2015-06-05: qty 2

## 2015-06-05 MED ORDER — INSULIN ASPART 100 UNIT/ML ~~LOC~~ SOLN
5.0000 [IU] | Freq: Once | SUBCUTANEOUS | Status: AC
  Administered 2015-06-05: 5 [IU] via SUBCUTANEOUS
  Filled 2015-06-05: qty 5

## 2015-06-05 MED ORDER — INSULIN ASPART 100 UNIT/ML ~~LOC~~ SOLN: 5.0000 [IU] | Freq: Three times a day (TID) | SUBCUTANEOUS | Status: DC

## 2015-06-05 MED ORDER — INSULIN ASPART 100 UNIT/ML ~~LOC~~ SOLN: 5.0000 [IU] | Freq: Once | SUBCUTANEOUS | Status: DC

## 2015-06-05 MED ORDER — ONDANSETRON HCL 4 MG/2ML IJ SOLN
4.0000 mg | Freq: Once | INTRAMUSCULAR | Status: AC
  Administered 2015-06-05: 4 mg via INTRAVENOUS
  Filled 2015-06-05: qty 2

## 2015-06-05 MED ORDER — DIPHENHYDRAMINE HCL 50 MG/ML IJ SOLN
INTRAMUSCULAR | Status: AC
  Administered 2015-06-05: 25 mg via INTRAVENOUS
  Filled 2015-06-05: qty 1

## 2015-06-05 MED ORDER — VANCOMYCIN HCL IN DEXTROSE 1-5 GM/200ML-% IV SOLN
1000.0000 mg | Freq: Once | INTRAVENOUS | Status: AC
  Administered 2015-06-05: 1000 mg via INTRAVENOUS
  Filled 2015-06-05: qty 200

## 2015-06-05 MED ORDER — ACETAMINOPHEN 500 MG PO TABS
1000.0000 mg | ORAL_TABLET | Freq: Once | ORAL | Status: AC
  Administered 2015-06-05: 1000 mg via ORAL
  Filled 2015-06-05: qty 2

## 2015-06-05 MED ORDER — FENTANYL CITRATE (PF) 100 MCG/2ML IJ SOLN
25.0000 ug | Freq: Once | INTRAMUSCULAR | Status: AC
  Administered 2015-06-05: 25 ug via INTRAVENOUS
  Filled 2015-06-05: qty 2

## 2015-06-05 MED ORDER — DIPHENHYDRAMINE HCL 50 MG/ML IJ SOLN
25.0000 mg | Freq: Once | INTRAMUSCULAR | Status: AC
  Administered 2015-06-05: 25 mg via INTRAVENOUS

## 2015-06-05 MED ORDER — SODIUM CHLORIDE 0.9 % IV BOLUS (SEPSIS)
1000.0000 mL | Freq: Once | INTRAVENOUS | Status: AC
  Administered 2015-06-05: 1000 mL via INTRAVENOUS

## 2015-06-05 MED ORDER — INSULIN GLARGINE 100 UNIT/ML ~~LOC~~ SOLN: 20.0000 [IU] | Freq: Every day | SUBCUTANEOUS | Status: DC

## 2015-06-05 MED ORDER — OXYCODONE-ACETAMINOPHEN 5-325 MG PO TABS
1.0000 | ORAL_TABLET | Freq: Once | ORAL | Status: AC
  Administered 2015-06-05: 1 via ORAL
  Filled 2015-06-05: qty 1

## 2015-06-05 MED ORDER — INSULIN ASPART 100 UNIT/ML ~~LOC~~ SOLN
5.0000 [IU] | Freq: Once | SUBCUTANEOUS | Status: AC
  Administered 2015-06-05: 5 [IU] via INTRAVENOUS
  Filled 2015-06-05: qty 5

## 2015-06-05 NOTE — Discharge Instructions (Signed)
[facility] (across the street from the hospital) Medication Pick-up Hours: Monday: 8:30 a.m. - 11:30 a.m.  1:00 p.m. - 4:00 p.m. Tuesday:  Medication pick-up available by appt. only Wednesday: 8:30 a.m. - 4:30 p.m. Thursday: 8:30 a.m. - 11:30 a.m. Friday: 8:30 a.m. - 11:30 a.m. (Closed during lunch 11:30am-1:00pm except Wednesdays) Business Hours: Monday-Thursday 8:30 a.m. - 4:30 p.m. Friday: 8:30 a.m. - 11:30 a.m. (Closed during lunch 11:30 a.m. - 1:00 p.m. except Wednesdays)  Cellulitis Cellulitis is an infection of the skin and the tissue under the skin. The infected area is usually red and tender. This happens most often in the arms and lower legs. HOME CARE   Take your antibiotic medicine as told. Finish the medicine even if you start to feel better.  Keep the infected arm or leg raised (elevated).  Put a warm cloth on the area up to 4 times per day.  Only take medicines as told by your doctor.  Keep all doctor visits as told. GET HELP IF:  You see red streaks on the skin coming from the infected area.  Your red area gets bigger or turns a dark color.  Your bone or joint under the infected area is painful after the skin heals.  Your infection comes back in the same area or different area.  You have a puffy (swollen) bump in the infected area.  You have new symptoms.  You have a fever. GET HELP RIGHT AWAY IF:   You feel very sleepy.  You throw up (vomit) or have watery poop (diarrhea).  You feel sick and have muscle aches and pains.   This information is not intended to replace advice given to you by your health care provider. Make sure you discuss any questions you have with your health care provider.   Document Released: 12/25/2007 Document Revised: 03/29/2015 Document Reviewed: 09/23/2011 Elsevier Interactive Patient Education [employer]2016 [employer] Inc.

## 2015-06-05 NOTE — ED Notes (Signed)
Pt repeatedly asking for pain medication.  Pt would be sleeping upon hourly checks.  Pt states would go to lobby to try to call ride.

## 2015-06-05 NOTE — Care Management Note (Signed)
Case Management Note  Patient Details  Name: Trevor Brown MRN: [id number] Date of Birth: 05-21-1991  Subjective/Objective:    Spoke to pt. Briefly and Trevor Brown is aware and willing to go across the street for his insulin. The pt. Is upset because they will not provide his "5/325". When asked to clarify Trevor Brown has stated Trevor Brown needs pain medication. I have explained to him the medication clinic will not provide those, and so [facility] or some other pharmacy and pay out of pocket. I have relayed this to the RN Sharlyne PacasStephan for the pt. As well as Dr . Archer AsaShavitz.              Action/Plan:   Expected Discharge Date:                  Expected Discharge Plan:     In-House Referral:     Discharge planning Services     Post Acute Care Choice:    Choice offered to:     DME Arranged:    DME Agency:     HH Arranged:    HH Agency:     Status of Service:     Medicare Important Message Given:    Date Medicare IM Given:    Medicare IM give by:    Date Additional Medicare IM Given:    Additional Medicare Important Message give by:     If discussed at Long Length of Stay Meetings, dates discussed:    Additional Comments:  Berna BueCheryl Manvir Thorson, RN 06/05/2015, 2:13 PM

## 2015-06-05 NOTE — ED Notes (Addendum)
Called EMS this morning due to being out of insulin x 2 weeks and "bumps" under left axilla x 3 days  Patient does not have  A PCP and states has been getting Insulin through ED.  Blood sugar was 447 by EMS and patient received 175 ML NS en route.  States had been taking 20 units of Lantus QPM and 4 units of Novalog with meals.  Also c/o dental pain.

## 2015-06-05 NOTE — Care Management Note (Signed)
Case Management Note  Patient Details  Name: Trevor Brown MRN: [id number] Date of Birth: Apr 13, 1991  Subjective/Objective:     Called [facility] to verify if they can fill a script for the pt. For clindamycin 450 TID  X 10 days. They only have 150 mg tabs , but have enough to fill the script as anticipated.      Let Sharlyne PacasStephan the RN for the pt. Know.          Action/Plan:   Expected Discharge Date:                  Expected Discharge Plan:     In-House Referral:     Discharge planning Services     Post Acute Care Choice:    Choice offered to:     DME Arranged:    DME Agency:     HH Arranged:    HH Agency:     Status of Service:     Medicare Important Message Given:    Date Medicare IM Given:    Medicare IM give by:    Date Additional Medicare IM Given:    Additional Medicare Important Message give by:     If discussed at Long Length of Stay Meetings, dates discussed:    Additional Comments:  Berna BueCheryl Cherice Glennie, RN 06/05/2015, 2:26 PM

## 2015-06-05 NOTE — ED Provider Notes (Addendum)
[facility] Emergency Department Provider Note  ____________________________________________  Time seen: Approximately 1220 PM  I have reviewed the triage vital signs and the nursing notes.   HISTORY  Chief Complaint Hyperglycemia and Abscess    HPI Trevor Brown is a 24 y.o. male with a history of diabetes and hepatitis who is presenting today with 2 weeks of "bumps" under his left arm as well as dental pain. He says that he has run out of his home insulin and this is why his sugar is elevated today. He says that he has gotten his insulin across street from the hospital where he has been able to pick it up for free. He says that he does not have any insurance from medications. He denies any nausea vomiting or abdominal pain. Denies any drug use or alcohol use.   Past Medical History  Diagnosis Date  . Diabetes mellitus without complication (HCC)   . Hepatitis C, acute may 2016  . Heart attack (HCC)     Pt claims he had heart attack a year ago ( 2015)- and was admitted in [facility] for that, but not given any meds or angiogram, on review of chart- I could not find any details like that.    Patient Active Problem List   Diagnosis Date Noted  . Malnutrition of moderate degree (HCC) 04/08/2015  . Hepatitis C 12/13/2014  . Diabetes type 1, uncontrolled (HCC) 12/11/2014  . Major depressive disorder, single episode, mild (HCC)   . Major depression, single episode 12/10/2014  . Diabetes mellitus type 1, uncontrolled (HCC) 12/09/2014  . Transaminitis 12/09/2014  . Folliculitis 12/09/2014  . Homelessness 12/09/2014    History reviewed. No pertinent past surgical history.  Current Outpatient Rx  Name  Route  Sig  Dispense  Refill  . insulin aspart (NOVOLOG) 100 UNIT/ML injection   Subcutaneous   Inject 5 Units into the skin 3 (three) times daily with meals. Patient taking differently: Inject 4 Units into the skin 3 (three) times daily with meals.    10 mL    0   . insulin glargine (LANTUS) 100 UNIT/ML injection   Subcutaneous   Inject 0.2 mLs (20 Units total) into the skin at bedtime.   10 mL   0   . diphenhydrAMINE (BENADRYL) 25 mg capsule   Oral   Take 1 capsule (25 mg total) by mouth every 4 (four) hours as needed. Patient not taking: Reported on 06/05/2015   30 capsule   0   . oxyCODONE-acetaminophen (ROXICET) 5-325 MG per tablet   Oral   Take 1 tablet by mouth every 4 (four) hours as needed for moderate pain. Patient not taking: Reported on 06/05/2015   8 tablet   0     Allergies Bee venom; Penicillins; Bee pollen; Ibuprofen; and Tramadol  Family History  Problem Relation Age of Onset  . Cirrhosis Mother   . Diabetes Mellitus II Maternal Grandmother     Social History Social History  Substance Use Topics  . Smoking status: Current Every Day Smoker -- 1.50 packs/day    Types: Cigarettes  . Smokeless tobacco: None  . Alcohol Use: No    Review of Systems Constitutional: No fever/chills Eyes: No visual changes. ENT: No sore throat. Cardiovascular: Denies chest pain. Respiratory: Denies shortness of breath. Gastrointestinal: No abdominal pain.  No nausea, no vomiting.  No diarrhea.  No constipation. Genitourinary: Negative for dysuria. Musculoskeletal: Negative for back pain. Skin: Negative for rash. Neurological: Negative for headaches,  focal weakness or numbness.  10-point ROS otherwise negative.  ____________________________________________   PHYSICAL EXAM:  VITAL SIGNS: ED Triage Vitals  Enc Vitals Group     BP 06/05/15 1111 115/77 mmHg     Pulse Rate 06/05/15 1111 86     Resp 06/05/15 1111 18     Temp 06/05/15 1111 98.7 F (37.1 C)     Temp Source 06/05/15 1111 Oral     SpO2 06/05/15 1111 100 %     Weight 06/05/15 1111 130 lb (58.968 kg)     Height 06/05/15 1111  (1.651 m)     Head Cir --      Peak Flow --      Pain Score 06/05/15 1111 10     Pain Loc --      Pain Edu? --      Excl.  in GC? --     Constitutional: Alert and oriented. Well appearing and in no acute distress. Eyes: Conjunctivae are normal. PERRL. EOMI. Head: Atraumatic. Nose: No congestion/rhinnorhea. Mouth/Throat: Mucous membranes are moist.  Oropharynx non-erythematous. Diffuse dental erosion with bacterial buildup at the gumline. No swelling or abscess. Neck: No stridor.   Cardiovascular: Normal rate, regular rhythm. Grossly normal heart sounds.  Good peripheral circulation. Respiratory: Normal respiratory effort.  No retractions. Lungs CTAB. Gastrointestinal: Soft and nontender. No distention. No abdominal bruits. No CVA tenderness. Musculoskeletal: No lower extremity tenderness nor edema.  No joint effusions. Neurologic:  Normal speech and language. No gross focal neurologic deficits are appreciated. No gait instability. Skin:  Left axilla with 3 round and raised areas of induration 2-3 cm each. There is no fluctuance or drainage of pus. Psychiatric: Mood and affect are normal. Speech and behavior are normal.  ____________________________________________   LABS (all labs ordered are listed, but only abnormal results are displayed)  Labs Reviewed  BASIC METABOLIC PANEL - Abnormal; Notable for the following:    Sodium 134 (*)    Chloride 96 (*)    Glucose, Bld 530 (*)    BUN 22 (*)    Anion gap 16 (*)    All other components within normal limits  URINALYSIS COMPLETEWITH MICROSCOPIC ([facility] ONLY) - Abnormal; Notable for the following:    Color, Urine STRAW (*)    APPearance CLEAR (*)    Glucose, UA >500 (*)    Ketones, ur 2+ (*)    Specific Gravity, Urine 1.031 (*)    All other components within normal limits  GLUCOSE, CAPILLARY - Abnormal; Notable for the following:    Glucose-Capillary 478 (*)    All other components within normal limits  BLOOD GAS, ARTERIAL - Abnormal; Notable for the following:    pH, Arterial 7.33 (*)    Bicarbonate 17.9 (*)    Acid-base deficit 7.1 (*)    Allens  test (pass/fail) POSITIVE (*)    All other components within normal limits  BASIC METABOLIC PANEL - Abnormal; Notable for the following:    Sodium 131 (*)    Chloride 99 (*)    CO2 19 (*)    Glucose, Bld 364 (*)    BUN 22 (*)    All other components within normal limits  GLUCOSE, CAPILLARY - Abnormal; Notable for the following:    Glucose-Capillary 267 (*)    All other components within normal limits  CBC  CBG MONITORING, ED   ____________________________________________  EKG   ____________________________________________  RADIOLOGY   ____________________________________________   PROCEDURES    ____________________________________________   INITIAL  IMPRESSION / ASSESSMENT AND PLAN / ED COURSE  Pertinent labs & imaging results that were available during my care of the patient were reviewed by me and considered in my medical decision making (see chart for details).  ----------------------------------------- 2:18 PM on 06/05/2015 -----------------------------------------  Patient with pH of 7.33. Does not appear to be in DKA. Discussed with case management who spoke with the patient and said that he is here because he can get his insulin filled for free but nobody will refill his "05-325s."  These are the patient's pain medications. We will continue with fluid hydration as well as insulin and rechecked the patient's BMP. He will be discharged as long as his labs improve.  He will pick up his medicines at the medication management clinic across the street from the hospital.  ----------------------------------------- 3:30 PM on 06/05/2015 -----------------------------------------  Patient became red in the face and torso towards and the vancomycin infusion. Vancomycin stopped. Patient also given Benadryl 25 mg IV for itching.  No difficulty breathing. No wheezing or stridor.  ----------------------------------------- 6:57 PM on  06/05/2015 -----------------------------------------  Patient resting comfortably at this time. Not complaining of any pain. His glucose is now in the 200s. He also no longer has an abnormal anion gap. I discussed his case with the admitting hospitalist, Dr. Allena Katz, who agrees of the patient will be appropriate for treatment at home. He will be able to pick up his insulin first thing in the morning at the free [facility]. He has picked up his medicine here before and knows were going to get his meds. I discussed the plan with the patient and he understands and is willing to comply. Furthermore, he has not had any nausea vomiting and continues to be without any abdominal pain. This all goes against diabetic ketoacidosis. He also no longer has an abnormal anion gap. He was also found to have a normal pH on his blood gas. I also discussed with him following up with the dentist. He knows that without proper dental care that his dental pain will not improve. He is given the list of dental clinics with his discharge instructions.  ____________________________________________   FINAL CLINICAL IMPRESSION(S) / ED DIAGNOSES  Hyperglycemia. Cellulitis. Dental caries.    Myrna Blazer, MD 06/05/15 1859  Additionally, the patient's redness has subsided at this time. It is likely that this was from the infusion of the vancomycin. I'll add this to his list of allergies.  Myrna Blazer, MD 06/05/15 1900

## 2015-06-06 ENCOUNTER — Inpatient Hospital Stay
Admission: EM | Admit: 2015-06-06 | Discharge: 2015-06-07 | DRG: 638 | Disposition: A | Discharge status: Home / Self Care | Payer: Self-pay | Attending: Internal Medicine | Admitting: Internal Medicine

## 2015-06-06 ENCOUNTER — Emergency Department: Payer: Self-pay

## 2015-06-06 ENCOUNTER — Encounter: Payer: Self-pay | Admitting: Emergency Medicine

## 2015-06-06 DIAGNOSIS — F17210 Nicotine dependence, cigarettes, uncomplicated: Secondary | ICD-10-CM | POA: Diagnosis present

## 2015-06-06 DIAGNOSIS — Z833 Family history of diabetes mellitus: Secondary | ICD-10-CM

## 2015-06-06 DIAGNOSIS — Z880 Allergy status to penicillin: Secondary | ICD-10-CM

## 2015-06-06 DIAGNOSIS — I252 Old myocardial infarction: Secondary | ICD-10-CM

## 2015-06-06 DIAGNOSIS — L0291 Cutaneous abscess, unspecified: Secondary | ICD-10-CM

## 2015-06-06 DIAGNOSIS — Z888 Allergy status to other drugs, medicaments and biological substances status: Secondary | ICD-10-CM

## 2015-06-06 DIAGNOSIS — L732 Hidradenitis suppurativa: Secondary | ICD-10-CM | POA: Diagnosis present

## 2015-06-06 DIAGNOSIS — E1010 Type 1 diabetes mellitus with ketoacidosis without coma: Principal | ICD-10-CM | POA: Diagnosis present

## 2015-06-06 DIAGNOSIS — Z794 Long term (current) use of insulin: Secondary | ICD-10-CM

## 2015-06-06 DIAGNOSIS — Z716 Tobacco abuse counseling: Secondary | ICD-10-CM

## 2015-06-06 DIAGNOSIS — K047 Periapical abscess without sinus: Secondary | ICD-10-CM | POA: Diagnosis present

## 2015-06-06 DIAGNOSIS — Z79899 Other long term (current) drug therapy: Secondary | ICD-10-CM

## 2015-06-06 DIAGNOSIS — Z9119 Patient's noncompliance with other medical treatment and regimen: Secondary | ICD-10-CM

## 2015-06-06 DIAGNOSIS — L0390 Cellulitis, unspecified: Secondary | ICD-10-CM | POA: Diagnosis present

## 2015-06-06 DIAGNOSIS — E1310 Other specified diabetes mellitus with ketoacidosis without coma: Secondary | ICD-10-CM

## 2015-06-06 DIAGNOSIS — Z720 Tobacco use: Secondary | ICD-10-CM

## 2015-06-06 LAB — COMPREHENSIVE METABOLIC PANEL
ALT: 86 U/L — ABNORMAL HIGH (ref 17–63)
AST: 38 U/L (ref 15–41)
Albumin: 4.7 g/dL (ref 3.5–5.0)
Alkaline Phosphatase: 181 U/L — ABNORMAL HIGH (ref 38–126)
Anion gap: 23 — ABNORMAL HIGH (ref 5–15)
BUN: 16 mg/dL (ref 6–20)
CO2: 12 mmol/L — ABNORMAL LOW (ref 22–32)
Calcium: 9.5 mg/dL (ref 8.9–10.3)
Chloride: 96 mmol/L — ABNORMAL LOW (ref 101–111)
Creatinine, Ser: 1.11 mg/dL (ref 0.61–1.24)
GFR calc Af Amer: >60 mL/min (ref >60)
GFR calc non Af Amer: >60 mL/min (ref >60)
Glucose, Bld: 429 mg/dL — ABNORMAL HIGH (ref 65–99)
Potassium: 4.5 mmol/L (ref 3.5–5.1)
Sodium: 131 mmol/L — ABNORMAL LOW (ref 135–145)
Total Bilirubin: 1.6 mg/dL — ABNORMAL HIGH (ref 0.3–1.2)
Total Protein: 8.6 g/dL — ABNORMAL HIGH (ref 6.5–8.1)

## 2015-06-06 LAB — CBC WITH DIFFERENTIAL/PLATELET
Basophils Absolute: 0.1 10*3/uL (ref 0–0.1)
Basophils Relative: 0 %
Eosinophils Absolute: 0 10*3/uL (ref 0–0.7)
Eosinophils Relative: 0 %
HCT: 48.8 % (ref 40.0–52.0)
Hemoglobin: 16.3 g/dL (ref 13.0–18.0)
Lymphocytes Relative: 14 %
Lymphs Abs: 2.2 10*3/uL (ref 1.0–3.6)
MCH: 29.9 pg (ref 26.0–34.0)
MCHC: 33.3 g/dL (ref 32.0–36.0)
MCV: 89.7 fL (ref 80.0–100.0)
Monocytes Absolute: 1.1 10*3/uL — ABNORMAL HIGH (ref 0.2–1.0)
Monocytes Relative: 7 %
Neutro Abs: 12.9 10*3/uL — ABNORMAL HIGH (ref 1.4–6.5)
Neutrophils Relative %: 79 %
Platelets: 244 10*3/uL (ref 150–440)
RBC: 5.44 MIL/uL (ref 4.40–5.90)
RDW: 12.8 % (ref 11.5–14.5)
WBC: 16.3 10*3/uL — ABNORMAL HIGH (ref 3.8–10.6)

## 2015-06-06 LAB — URINALYSIS COMPLETE WITH MICROSCOPIC (ARMC ONLY)
Bacteria, UA: NONE SEEN
Bilirubin Urine: NEGATIVE
Glucose, UA: >500 mg/dL — AB
Hgb urine dipstick: NEGATIVE
Leukocytes, UA: NEGATIVE
Nitrite: NEGATIVE
Protein, ur: NEGATIVE mg/dL
RBC / HPF: NONE SEEN RBC/hpf (ref 0–5)
Specific Gravity, Urine: 1.029 (ref 1.005–1.030)
Squamous Epithelial / LPF: NONE SEEN
WBC, UA: NONE SEEN WBC/hpf (ref 0–5)
pH: 5 (ref 5.0–8.0)

## 2015-06-06 LAB — BLOOD GAS, VENOUS
Acid-base deficit: 13 mmol/L — ABNORMAL HIGH (ref 0.0–2.0)
Bicarbonate: 13.5 mEq/L — ABNORMAL LOW (ref 21.0–28.0)
FIO2: 0.21
O2 Saturation: 51.4 %
Patient temperature: 37
pCO2, Ven: 33 mmHg — ABNORMAL LOW (ref 44.0–60.0)
pH, Ven: 7.22 — ABNORMAL LOW (ref 7.320–7.430)
pO2, Ven: 34 mmHg (ref 30.0–45.0)

## 2015-06-06 LAB — BASIC METABOLIC PANEL
Anion gap: 9 (ref 5–15)
BUN: 16 mg/dL (ref 6–20)
CO2: 18 mmol/L — ABNORMAL LOW (ref 22–32)
Calcium: 8.9 mg/dL (ref 8.9–10.3)
Chloride: 106 mmol/L (ref 101–111)
Creatinine, Ser: 0.8 mg/dL (ref 0.61–1.24)
GFR calc Af Amer: >60 mL/min (ref >60)
GFR calc non Af Amer: >60 mL/min (ref >60)
Glucose, Bld: 217 mg/dL — ABNORMAL HIGH (ref 65–99)
Potassium: 4.2 mmol/L (ref 3.5–5.1)
Sodium: 133 mmol/L — ABNORMAL LOW (ref 135–145)

## 2015-06-06 LAB — GLUCOSE, CAPILLARY
Glucose-Capillary: 179 mg/dL — ABNORMAL HIGH (ref 65–99)
Glucose-Capillary: 189 mg/dL — ABNORMAL HIGH (ref 65–99)
Glucose-Capillary: 217 mg/dL — ABNORMAL HIGH (ref 65–99)
Glucose-Capillary: 231 mg/dL — ABNORMAL HIGH (ref 65–99)
Glucose-Capillary: 318 mg/dL — ABNORMAL HIGH (ref 65–99)
Glucose-Capillary: 372 mg/dL — ABNORMAL HIGH (ref 65–99)
Glucose-Capillary: 459 mg/dL — ABNORMAL HIGH (ref 65–99)

## 2015-06-06 LAB — CBC
HCT: 46.1 % (ref 40.0–52.0)
Hemoglobin: 15.5 g/dL (ref 13.0–18.0)
MCH: 30.3 pg (ref 26.0–34.0)
MCHC: 33.7 g/dL (ref 32.0–36.0)
MCV: 89.9 fL (ref 80.0–100.0)
Platelets: 224 10*3/uL (ref 150–440)
RBC: 5.12 MIL/uL (ref 4.40–5.90)
RDW: 12.6 % (ref 11.5–14.5)
WBC: 15.7 10*3/uL — ABNORMAL HIGH (ref 3.8–10.6)

## 2015-06-06 LAB — TROPONIN I: Troponin I: 0.04 ng/mL — ABNORMAL HIGH (ref <0.031)

## 2015-06-06 MED ORDER — ACETAMINOPHEN 325 MG PO TABS
650.0000 mg | ORAL_TABLET | Freq: Four times a day (QID) | ORAL | Status: DC | PRN
  Administered 2015-06-07: 08:00:00 650 mg via ORAL
  Filled 2015-06-06: qty 2

## 2015-06-06 MED ORDER — POTASSIUM CHLORIDE 10 MEQ/100ML IV SOLN
10.0000 meq | INTRAVENOUS | Status: AC
  Administered 2015-06-06 (×2): 10 meq via INTRAVENOUS
  Filled 2015-06-06 (×2): qty 100

## 2015-06-06 MED ORDER — DEXTROSE-NACL 5-0.45 % IV SOLN: INTRAVENOUS | Status: DC

## 2015-06-06 MED ORDER — DEXTROSE-NACL 5-0.45 % IV SOLN
INTRAVENOUS | Status: DC
  Administered 2015-06-06: 21:00:00 via INTRAVENOUS

## 2015-06-06 MED ORDER — FAMOTIDINE IN NACL 20-0.9 MG/50ML-% IV SOLN
20.0000 mg | Freq: Once | INTRAVENOUS | Status: AC
  Administered 2015-06-06: 20 mg via INTRAVENOUS
  Filled 2015-06-06: qty 50

## 2015-06-06 MED ORDER — ENOXAPARIN SODIUM 40 MG/0.4ML ~~LOC~~ SOLN
40.0000 mg | SUBCUTANEOUS | Status: DC
  Administered 2015-06-06: 40 mg via SUBCUTANEOUS
  Filled 2015-06-06: qty 0.4

## 2015-06-06 MED ORDER — ACETAMINOPHEN 650 MG RE SUPP: 650.0000 mg | Freq: Four times a day (QID) | RECTAL | Status: DC | PRN

## 2015-06-06 MED ORDER — SODIUM CHLORIDE 0.9 % IV BOLUS (SEPSIS)
1000.0000 mL | Freq: Once | INTRAVENOUS | Status: AC
  Administered 2015-06-06: 1000 mL via INTRAVENOUS
  Filled 2015-06-06: qty 1000

## 2015-06-06 MED ORDER — ONDANSETRON HCL 4 MG/2ML IJ SOLN: 4.0000 mg | Freq: Four times a day (QID) | INTRAMUSCULAR | Status: DC | PRN

## 2015-06-06 MED ORDER — ONDANSETRON HCL 4 MG PO TABS: 4.0000 mg | ORAL_TABLET | Freq: Four times a day (QID) | ORAL | Status: DC | PRN

## 2015-06-06 MED ORDER — SODIUM CHLORIDE 0.9 % IV SOLN
INTRAVENOUS | Status: DC
  Administered 2015-06-06: 3.1 [IU]/h via INTRAVENOUS
  Filled 2015-06-06: qty 2.5

## 2015-06-06 MED ORDER — ONDANSETRON HCL 4 MG/2ML IJ SOLN
4.0000 mg | Freq: Once | INTRAMUSCULAR | Status: AC
  Administered 2015-06-06: 4 mg via INTRAVENOUS
  Filled 2015-06-06: qty 2

## 2015-06-06 MED ORDER — LIDOCAINE-EPINEPHRINE (PF) 1 %-1:200000 IJ SOLN
INTRAMUSCULAR | Status: AC
  Filled 2015-06-06: qty 30

## 2015-06-06 MED ORDER — FENTANYL CITRATE (PF) 100 MCG/2ML IJ SOLN
INTRAMUSCULAR | Status: AC
  Administered 2015-06-06: 50 ug via INTRAVENOUS
  Filled 2015-06-06: qty 2

## 2015-06-06 MED ORDER — FENTANYL CITRATE (PF) 100 MCG/2ML IJ SOLN
25.0000 ug | Freq: Once | INTRAMUSCULAR | Status: AC
  Administered 2015-06-06: 25 ug via INTRAVENOUS
  Filled 2015-06-06: qty 2

## 2015-06-06 MED ORDER — SODIUM CHLORIDE 0.9 % IV SOLN: INTRAVENOUS | Status: DC

## 2015-06-06 MED ORDER — SODIUM CHLORIDE 0.9 % IV SOLN: INTRAVENOUS | Status: AC

## 2015-06-06 MED ORDER — CLINDAMYCIN PHOSPHATE 600 MG/50ML IV SOLN
600.0000 mg | Freq: Once | INTRAVENOUS | Status: AC
  Administered 2015-06-06: 600 mg via INTRAVENOUS
  Filled 2015-06-06: qty 50

## 2015-06-06 MED ORDER — FENTANYL CITRATE (PF) 100 MCG/2ML IJ SOLN
50.0000 ug | Freq: Once | INTRAMUSCULAR | Status: AC
  Administered 2015-06-06: 50 ug via INTRAVENOUS

## 2015-06-06 MED ORDER — OXYCODONE HCL 5 MG PO TABS
5.0000 mg | ORAL_TABLET | ORAL | Status: DC | PRN
  Administered 2015-06-06 – 2015-06-07 (×3): 5 mg via ORAL
  Filled 2015-06-06 (×3): qty 1

## 2015-06-06 NOTE — Progress Notes (Addendum)
eLink Physician-Brief Progress Note Patient Name: Emeline Darlingony B Rendall DOB: [birth year] MRN: [id number]   Date of Service  06/06/2015  HPI/Events of Note  24 yo male with PMH of DM, Hepatitis C and CAD - s/p MI. Admitted to ICU with DKA. Felt to be d/t his inability to get his insulin.  Now on an insulin IV infusion. [facility].   eICU Interventions  Continue current management.     Intervention Category Evaluation Type: New Patient Evaluation  Lenell AntuSommer,Steven Eugene 06/06/2015, 9:01 PM

## 2015-06-06 NOTE — ED Notes (Signed)
Pt presents via [facility]. Pt was seen here yesterday with blood sugar problems as he has been off his medications for approx 3 weeks. Pt presents today with elevated CBG, 459, and c/o chest pain that started this morning. Pt c/o heart burn and is actively vomiting in triage at this time.

## 2015-06-06 NOTE — ED Notes (Signed)
This RN and Berenice PrimasEmma H, RN at bedside to start and verify insulin drip.

## 2015-06-06 NOTE — ED Notes (Signed)
ED MD at bedside to lance abscess under pt's L armpit.

## 2015-06-06 NOTE — ED Provider Notes (Signed)
[facility] Emergency Department Provider Note  ____________________________________________  Time seen: Seen upon arrival to the emergency department  I have reviewed the triage vital signs and the nursing notes.   HISTORY  Chief Complaint Hyperglycemia and Chest Pain    HPI Trevor Brown is a 24 y.o. male with uncontrolled diabetes who is presenting today with chest burning as well as high blood sugar. He was seen here by me yesterday and discharge him after his blood sugars improved. The plan was for him to follow-up at the [facility] a hospital to receive the medications. However, he was not able to do that today. He presents back today with burning chest pain which she describes as indigestion-like. His sugars were in the 500s in the field and down the 400s here on the bedside point of care glucose. He is having persistent pain to his left axilla where he has the areas of cellulitis seen yesterday.   Past Medical History  Diagnosis Date  . Diabetes mellitus without complication (HCC)   . Hepatitis C, acute may 2016  . Heart attack (HCC)     Pt claims he had heart attack a year ago ( 2015)- and was admitted in [facility] for that, but not given any meds or angiogram, on review of chart- I could not find any details like that.    Patient Active Problem List   Diagnosis Date Noted  . Malnutrition of moderate degree (HCC) 04/08/2015  . Hepatitis C 12/13/2014  . Diabetes type 1, uncontrolled (HCC) 12/11/2014  . Major depressive disorder, single episode, mild (HCC)   . Major depression, single episode 12/10/2014  . Diabetes mellitus type 1, uncontrolled (HCC) 12/09/2014  . Transaminitis 12/09/2014  . Folliculitis 12/09/2014  . Homelessness 12/09/2014    History reviewed. No pertinent past surgical history.  Current Outpatient Rx  Name  Route  Sig  Dispense  Refill  . clindamycin (CLEOCIN) 150 MG capsule   Oral   Take 3  capsules (450 mg total) by mouth 3 (three) times daily.   90 capsule   0   . diphenhydrAMINE (BENADRYL) 25 mg capsule   Oral   Take 1 capsule (25 mg total) by mouth every 4 (four) hours as needed. Patient not taking: Reported on 06/05/2015   30 capsule   0   . insulin aspart (NOVOLOG) 100 UNIT/ML injection   Subcutaneous   Inject 5 Units into the skin 3 (three) times daily with meals.   10 mL   0   . insulin glargine (LANTUS) 100 UNIT/ML injection   Subcutaneous   Inject 0.2 mLs (20 Units total) into the skin at bedtime.   10 mL   0   . oxyCODONE-acetaminophen (ROXICET) 5-325 MG per tablet   Oral   Take 1 tablet by mouth every 4 (four) hours as needed for moderate pain. Patient not taking: Reported on 06/05/2015   8 tablet   0     Allergies Bee venom; Penicillins; Bee pollen; Ibuprofen; Tramadol; and Vancomycin  Family History  Problem Relation Age of Onset  . Cirrhosis Mother   . Diabetes Mellitus II Maternal Grandmother     Social History Social History  Substance Use Topics  . Smoking status: Current Every Day Smoker -- 1.50 packs/day    Types: Cigarettes  . Smokeless tobacco: None  . Alcohol Use: No    Review of Systems Constitutional: No fever/chills Eyes: No visual changes. ENT: No sore throat. Cardiovascular: Denies  chest pain. Respiratory: Denies shortness of breath. Gastrointestinal: No abdominal pain.   no vomiting.  No diarrhea.  No constipation. Genitourinary: Negative for dysuria. Musculoskeletal: Negative for back pain. Skin: Erythema to the left axilla with pain Neurological: Negative for headaches, focal weakness or numbness.  10-point ROS otherwise negative.  ____________________________________________   PHYSICAL EXAM:  VITAL SIGNS: ED Triage Vitals  Enc Vitals Group     BP 06/06/15 1541 149/92 mmHg     Pulse Rate 06/06/15 1541 94     Resp 06/06/15 1541 24     Temp 06/06/15 1541 98.5 F (36.9 C)     Temp Source 06/06/15  1541 Oral     SpO2 06/06/15 1541 98 %     Weight 06/06/15 1541 125 lb (56.7 kg)     Height 06/06/15 1541 5\' 4"  (1.626 m)     Head Cir --      Peak Flow --      Pain Score 06/06/15 1541 10     Pain Loc --      Pain Edu? --      Excl. in GC? --     Constitutional: Alert and oriented. Well appearing and in no acute distress. Eyes: Conjunctivae are normal. PERRL. EOMI. Head: Atraumatic. Nose: No congestion/rhinnorhea. Mouth/Throat: Mucous membranes are moist.  Oropharynx non-erythematous. Dental erosion diffusely. Neck: No stridor.   Cardiovascular: Normal rate, regular rhythm. Grossly normal heart sounds.  Good peripheral circulation. Respiratory: Normal respiratory effort.  No retractions. Lungs CTAB. Gastrointestinal: Soft and nontender. No distention. No abdominal bruits. No CVA tenderness. Musculoskeletal: No lower extremity tenderness nor edema.  No joint effusions. Neurologic:  Normal speech and language. No gross focal neurologic deficits are appreciated. No gait instability. Skin:  Left axilla with 3 areas of induration similar to yesterday but medial most now oozing pus. Tender to touch.  Psychiatric: Mood and affect are normal. Speech and behavior are normal.  ____________________________________________   LABS (all labs ordered are listed, but only abnormal results are displayed)  Labs Reviewed  CBC WITH DIFFERENTIAL/PLATELET - Abnormal; Notable for the following:    WBC 16.3 (*)    Neutro Abs 12.9 (*)    Monocytes Absolute 1.1 (*)    All other components within normal limits  COMPREHENSIVE METABOLIC PANEL - Abnormal; Notable for the following:    Sodium 131 (*)    Chloride 96 (*)    CO2 12 (*)    Glucose, Bld 429 (*)    Total Protein 8.6 (*)    ALT 86 (*)    Alkaline Phosphatase 181 (*)    Total Bilirubin 1.6 (*)    Anion gap 23 (*)    All other components within normal limits  TROPONIN I - Abnormal; Notable for the following:    Troponin I 0.04 (*)    All  other components within normal limits  BLOOD GAS, VENOUS - Abnormal; Notable for the following:    pH, Ven 7.22 (*)    pCO2, Ven 33 (*)    Bicarbonate 13.5 (*)    Acid-base deficit 13.0 (*)    All other components within normal limits  GLUCOSE, CAPILLARY - Abnormal; Notable for the following:    Glucose-Capillary 459 (*)    All other components within normal limits  URINALYSIS COMPLETEWITH MICROSCOPIC ([facility] ONLY)   ____________________________________________  EKG  ED ECG REPORT I, Arelia LongestSchaevitz,  David M, the attending physician, personally viewed and interpreted this ECG.   Date: 06/06/2015  EKG Time: 1536  Rate: 74  Rhythm: normal EKG, normal sinus rhythm  Axis: Normal axis  Intervals:none  ST&T Change: No ST segment elevation or depression. No abnormal T-wave inversion.  ____________________________________________  RADIOLOGY  Mild hyperinflation without acute infiltrate. ____________________________________________   PROCEDURES  CRITICAL CARE Performed by: Arelia Longest   Total critical care time: 35 minutes  Critical care time was exclusive of separately billable procedures and treating other patients.  Critical care was necessary to treat or prevent imminent or life-threatening deterioration.  Critical care was time spent personally by me on the following activities: development of treatment plan with patient and/or surrogate as well as nursing, discussions with consultants, evaluation of patient's response to treatment, examination of patient, obtaining history from patient or surrogate, ordering and performing treatments and interventions, ordering and review of laboratory studies, ordering and review of radiographic studies, pulse oximetry and re-evaluation of patient's condition.  Patient ordered insulin drip for diabetic ketoacidosis.  INCISION AND DRAINAGE Performed by: Arelia Longest Consent: Verbal consent obtained. Risks and benefits:  risks, benefits and alternatives were discussed Type: abscess  Body area: Left axilla  Anesthesia: local infiltration  Incision was made with a scalpel.  Local anesthetic: lidocaine 1 % with epinephrine  Anesthetic total: 4 ml  Complexity: complex Blunt dissection to break up loculations  Drainage: purulent  Drainage amount: 3-4 mL's   Packing material: 1/4 in iodoform gauze  Patient tolerance: Patient tolerated the procedure well with no immediate complications.  I&D of 2 abscessed areas. One which was already draining which was medial. A 1 cm incision was made here. as well as a large one laterally where a 3 cm incision was made.  ____________________________________________   INITIAL IMPRESSION / ASSESSMENT AND PLAN / ED COURSE  Pertinent labs & imaging results that were available during my care of the patient were reviewed by me and considered in my medical decision making (see chart for details).  ----------------------------------------- 4:03 PM on 06/06/2015 -----------------------------------------  Patient not actively vomiting. We'll give Zofran as well as fluids.  ----------------------------------------- 5:54 PM on 06/06/2015 -----------------------------------------  Patient found to be in acute diabetic ketoacidosis. We'll admit to the hospital on an insulin drip. Signed out to Dr. Allena Katz. Also ordered Cleocin for patient's left axillary abscesses. ____________________________________________   FINAL CLINICAL IMPRESSION(S) / ED DIAGNOSES  Left axillary abscesses. Diabetic ketoacidosis.    Myrna Blazer, MD 06/06/15 [phone]

## 2015-06-06 NOTE — H&P (Signed)
[facility] [place] [facility]   PATIENT NAME: Trevor Brown    MR#:  [id number]  DATE OF BIRTH:  Oct 03, 1990  DATE OF ADMISSION:  06/06/2015  PRIMARY CARE PHYSICIAN: No PCP Per Patient   REQUESTING/REFERRING PHYSICIAN: Dr Pershing ProudSchaevitz  CHIEF COMPLAINT:  Vomiting elevated blood sugars.  HISTORY OF PRESENT ILLNESS:  Trevor Knackony Munday  is a 24 y.o. male with a known history of diabetes type 1, ongoing tobacco abuse consisted emergency room with nausea vomiting intractable, limited blood sugars and his inability to get access to insulin. She was seen in the emergency room yesterday with a limited sugars and mild DKA corrected with IV fluids and insulin given in the ER he was asked to go to the medication management clinic however due to transportation issues he was not able to go. Patient also was found to have infected pustules and has left armpit. His white count is elevated. He is in DKA with anion gap metabolic acidosis Patient received IV fluids, IV clindamycin, I and D of his pustules in the left armpit by ER physician and is currently started on insulin drip.  PAST MEDICAL HISTORY:   Past Medical History  Diagnosis Date  . Diabetes mellitus without complication (HCC)   . Hepatitis C, acute may 2016  . Heart attack (HCC)     Pt claims he had heart attack a year ago ( 2015)- and was admitted in [facility] for that, but not given any meds or angiogram, on review of chart- I could not find any details like that.    PAST SURGICAL HISTOIRY:  None  SOCIAL HISTORY:   Social History  Substance Use Topics  . Smoking status: Current Every Day Smoker -- 1.50 packs/day    Types: Cigarettes  . Smokeless tobacco: Not on file  . Alcohol Use: No    FAMILY HISTORY:   Family History  Problem Relation Age of Onset  . Cirrhosis Mother   . Diabetes Mellitus II Maternal Grandmother     DRUG ALLERGIES:   Allergies  Allergen Reactions  . Bee Venom Anaphylaxis  .  Penicillins Anaphylaxis and Hives  . Bee Pollen   . Ibuprofen Other (See Comments)    Stomach upset  . Tramadol Hives and Swelling  . Vancomycin Rash    Red man     REVIEW OF SYSTEMS:  Review of Systems  Constitutional: Negative for fever, chills and weight loss.  HENT: Negative for ear discharge, ear pain and nosebleeds.   Eyes: Negative for blurred vision, pain and discharge.  Respiratory: Negative for sputum production, shortness of breath, wheezing and stridor.   Cardiovascular: Negative for chest pain, palpitations, orthopnea and PND.  Gastrointestinal: Negative for nausea, vomiting, abdominal pain and diarrhea.  Genitourinary: Negative for urgency and frequency.  Musculoskeletal: Negative for back pain and joint pain.  Skin:       Pustules in left armpit  Neurological: Negative for sensory change, speech change, focal weakness and weakness.  Psychiatric/Behavioral: Negative for depression and hallucinations. The patient is not nervous/anxious.   All other systems reviewed and are negative.    MEDICATIONS AT HOME:   Prior to Admission medications   Medication Sig Start Date End Date Taking? Authorizing Provider  clindamycin (CLEOCIN) 150 MG capsule Take 3 capsules (450 mg total) by mouth 3 (three) times daily. 06/05/15 06/15/15  Myrna Blazeravid Matthew Schaevitz, MD  diphenhydrAMINE (BENADRYL) 25 mg capsule Take 1 capsule (25 mg total) by mouth every 4 (four) hours as needed.  Patient not taking: Reported on 06/05/2015 03/13/15 03/12/16  Joni Reining, PA-C  insulin aspart (NOVOLOG) 100 UNIT/ML injection Inject 5 Units into the skin 3 (three) times daily with meals. 06/05/15 07/05/15  Myrna Blazer, MD  insulin glargine (LANTUS) 100 UNIT/ML injection Inject 0.2 mLs (20 Units total) into the skin at bedtime. 06/05/15 07/05/15  Myrna Blazer, MD  oxyCODONE-acetaminophen (ROXICET) 5-325 MG per tablet Take 1 tablet by mouth every 4 (four) hours as needed for moderate  pain. Patient not taking: Reported on 06/05/2015 03/13/15   Joni Reining, PA-C      VITAL SIGNS:  Blood pressure 149/98, pulse 42, temperature 99 F (37.2 C), temperature source Oral, resp. rate 18, height  (1.626 m), weight 56.7 kg (125 lb), SpO2 80 %.  PHYSICAL EXAMINATION:  GENERAL:  24 y.o.-year-old patient lying in the bed with no acute distress.  EYES: Pupils equal, round, reactive to light and accommodation. No scleral icterus. Extraocular muscles intact.  HEENT: Head atraumatic, normocephalic. Oropharynx and nasopharynx clear.  NECK:  Supple, no jugular venous distention. No thyroid enlargement, no tenderness.  LUNGS: Normal breath sounds bilaterally, no wheezing, rales,rhonchi or crepitation. No use of accessory muscles of respiration.  CARDIOVASCULAR: S1, S2 normal. No murmurs, rubs, or gallops.  ABDOMEN: Soft, nontender, nondistended. Bowel sounds present. No organomegaly or mass.  EXTREMITIES: No pedal edema, cyanosis, or clubbing.  NEUROLOGIC: Cranial nerves II through XII are intact. Muscle strength 5/5 in all extremities. Sensation intact. Gait not checked.  PSYCHIATRIC: The patient is alert and oriented x 3.  SKIN: No obvious rash, lesion, or ulcer.  Pustules in the left armpit s/p I and D  LABORATORY PANEL:   CBC  Recent Labs Lab 06/06/15 1606  WBC 16.3*  HGB 16.3  HCT 48.8  PLT 244   ------------------------------------------------------------------------------------------------------------------  Chemistries   Recent Labs Lab 06/06/15 1606  NA 131*  K 4.5  CL 96*  CO2 12*  GLUCOSE 429*  BUN 16  CREATININE 1.11  CALCIUM 9.5  AST 38  ALT 86*  ALKPHOS 181*  BILITOT 1.6*   ------------------------------------------------------------------------------------------------------------------  Cardiac Enzymes  Recent Labs Lab 06/06/15 1606  TROPONINI 0.04*    ------------------------------------------------------------------------------------------------------------------  RADIOLOGY:  Dg Chest 1 View  06/06/2015  CLINICAL DATA:  Blood sugar control problems, elevated blood glucose 459, chest pain beginning this morning, has been off medications for approximately 3 weeks, heartburn, vomiting, personal history diabetes mellitus, hepatitis-C, smoking EXAM: CHEST 1 VIEW COMPARISON:  Portable exam 1548 hours compared to 02/07/2015 FINDINGS: Normal heart size, mediastinal contours, and pulmonary vascularity. Lungs mildly hyperinflated but clear. No pulmonary infiltrate, pleural effusion or pneumothorax. Bones unremarkable. IMPRESSION: Mild hyperinflation without acute infiltrate. Electronically Signed   By: Ulyses Southward M.D.   On: 06/06/2015 16:19   IMPRESSION AND PLAN:  Dyami Umbach  is a 24 y.o. male with a known history of diabetes type 1, ongoing tobacco abuse consisted emergency room with nausea vomiting intractable, limited blood sugars and his inability to get access to insulin.  * DKA Admit to ICU -Start insulin  drip, continue IV fluids BMP every 4 hourly Care management consultation for insulin supply.  *Left arm pit Hidradenitis S/p I and D in the ER -IV clindamycin. F/u Wound culture.  * Nausea and vomiting phenergan and Zofran IV as needed.  * Smoking Counseled to quit smoking for 4 minutes, offered nicotine patch he agreed to use that in hospital.   All the records are reviewed and case discussed  with ED provider. Management plans discussed with the patient, family and they are in agreement.   CODE STATUS: Full  TOTAL  critical TIME TAKING CARE OF THIS PATIENT: 60  minutes.    Kaius Daino M.D on 06/06/2015 at 5:57 PM  Between 7am to 6pm - Pager - [phone]  After 6pm go to [email or web address] - password [email or web address] [facility]  [place] [place] [facility]  Office  [phone]  CC: Primary care physician; No PCP Per  Patient

## 2015-06-07 LAB — BASIC METABOLIC PANEL
Anion gap: 4 — ABNORMAL LOW (ref 5–15)
Anion gap: 7 (ref 5–15)
BUN: 16 mg/dL (ref 6–20)
BUN: 17 mg/dL (ref 6–20)
CO2: 21 mmol/L — ABNORMAL LOW (ref 22–32)
CO2: 21 mmol/L — ABNORMAL LOW (ref 22–32)
Calcium: 8.8 mg/dL — ABNORMAL LOW (ref 8.9–10.3)
Calcium: 8.4 mg/dL — ABNORMAL LOW (ref 8.9–10.3)
Chloride: 107 mmol/L (ref 101–111)
Chloride: 100 mmol/L — ABNORMAL LOW (ref 101–111)
Creatinine, Ser: 0.63 mg/dL (ref 0.61–1.24)
Creatinine, Ser: 0.73 mg/dL (ref 0.61–1.24)
GFR calc Af Amer: >60 mL/min (ref >60)
GFR calc Af Amer: >60 mL/min (ref >60)
GFR calc non Af Amer: >60 mL/min (ref >60)
GFR calc non Af Amer: >60 mL/min (ref >60)
Glucose, Bld: 101 mg/dL — ABNORMAL HIGH (ref 65–99)
Glucose, Bld: 286 mg/dL — ABNORMAL HIGH (ref 65–99)
Potassium: 4 mmol/L (ref 3.5–5.1)
Potassium: 4.2 mmol/L (ref 3.5–5.1)
Sodium: 132 mmol/L — ABNORMAL LOW (ref 135–145)
Sodium: 128 mmol/L — ABNORMAL LOW (ref 135–145)

## 2015-06-07 LAB — GLUCOSE, CAPILLARY
Glucose-Capillary: 100 mg/dL — ABNORMAL HIGH (ref 65–99)
Glucose-Capillary: 125 mg/dL — ABNORMAL HIGH (ref 65–99)
Glucose-Capillary: 251 mg/dL — ABNORMAL HIGH (ref 65–99)
Glucose-Capillary: 256 mg/dL — ABNORMAL HIGH (ref 65–99)
Glucose-Capillary: 267 mg/dL — ABNORMAL HIGH (ref 65–99)
Glucose-Capillary: 341 mg/dL — ABNORMAL HIGH (ref 65–99)
Glucose-Capillary: 79 mg/dL (ref 65–99)
Glucose-Capillary: 85 mg/dL (ref 65–99)
Glucose-Capillary: 97 mg/dL (ref 65–99)

## 2015-06-07 MED ORDER — INSULIN ASPART 100 UNIT/ML ~~LOC~~ SOLN: 0.0000 [IU] | SUBCUTANEOUS | Status: DC

## 2015-06-07 MED ORDER — INSULIN GLARGINE 100 UNIT/ML ~~LOC~~ SOLN
12.0000 [IU] | SUBCUTANEOUS | Status: DC
  Administered 2015-06-07: 12 [IU] via SUBCUTANEOUS
  Filled 2015-06-07: qty 0.12

## 2015-06-07 MED ORDER — INSULIN ASPART 100 UNIT/ML ~~LOC~~ SOLN
0.0000 [IU] | Freq: Three times a day (TID) | SUBCUTANEOUS | Status: DC
  Administered 2015-06-07: 5 [IU] via SUBCUTANEOUS
  Filled 2015-06-07: qty 5

## 2015-06-07 MED ORDER — CLINDAMYCIN PHOSPHATE 600 MG/50ML IV SOLN
600.0000 mg | Freq: Three times a day (TID) | INTRAVENOUS | Status: DC
  Administered 2015-06-07: 600 mg via INTRAVENOUS
  Filled 2015-06-07 (×3): qty 50

## 2015-06-07 MED ORDER — BUTALBITAL-APAP-CAFFEINE 50-325-40 MG PO TABS: 1.0000 | ORAL_TABLET | Freq: Once | ORAL | Status: DC

## 2015-06-07 MED ORDER — OXYCODONE HCL 5 MG PO TABS: 5.0000 mg | ORAL_TABLET | Freq: Two times a day (BID) | ORAL | Status: DC | PRN

## 2015-06-07 MED ORDER — OXYCODONE HCL 5 MG PO TABS
5.0000 mg | ORAL_TABLET | Freq: Once | ORAL | Status: AC
  Administered 2015-06-07: 5 mg via ORAL
  Filled 2015-06-07: qty 1

## 2015-06-07 MED ORDER — SODIUM CHLORIDE 0.9 % IV SOLN
INTRAVENOUS | Status: DC
  Administered 2015-06-07: 09:00:00 via INTRAVENOUS

## 2015-06-07 NOTE — Discharge Instructions (Signed)
Hidradenitis Suppurativa Hidradenitis suppurativa is a long-term (chronic) skin disease that starts with blocked sweat glands or hair follicles. Bacteria may grow in these blocked openings of your skin. Hidradenitis suppurativa is like a severe form of acne that develops in areas of your body where acne would be unusual. It is most likely to affect the areas of your body where skin rubs against skin and becomes moist. This includes your:  Underarms.  Groin.  Genital areas.  Buttocks.  Upper thighs.  Breasts. Hidradenitis suppurativa may start out with small pimples. The pimples can develop into deep sores that break open (rupture) and drain pus. Over time your skin may thicken and become scarred. Hidradenitis suppurativa cannot be passed from person to person.  CAUSES  The exact cause of hidradenitis suppurativa is not known. This condition may be due to:  Male and male hormones. The condition is rare before and after puberty.  An overactive body defense system (immune system). Your immune system may overreact to the blocked hair follicles or sweat glands and cause swelling and pus-filled sores. RISK FACTORS You may have a higher risk of hidradenitis suppurativa if you:  Are a woman.  Are between ages 11 and 9.  Have a family history of hidradenitis suppurativa.  Have a personal history of acne.  Are overweight.  Smoke.  Take the drug lithium. SIGNS AND SYMPTOMS  The first signs of an outbreak are usually painful skin bumps that look like pimples. As the condition progresses:  Skin bumps may get bigger and grow deeper into the skin.  Bumps under the skin may rupture and drain smelly pus.  Skin may become itchy and infected.  Skin may thicken and scar.  Drainage may continue through tunnels under the skin (fistulas).  Walking and moving your arms can become painful. DIAGNOSIS  Your health care provider may diagnose hidradenitis suppurativa based on your medical  history and your signs and symptoms. A physical exam will also be done. You may need to see a health care provider who specializes in skin diseases (dermatologist). You may also have tests done to confirm the diagnosis. These can include:  Swabbing a sample of pus or drainage from your skin so it can be sent to the lab and tested for infection.  Blood tests to check for infection. TREATMENT  The same treatment will not work for everybody with hidradenitis suppurativa. Your treatment will depend on how severe your symptoms are. You may need to try several treatments to find what works best for you. Part of your treatment may include cleaning and bandaging (dressing) your wounds. You may also have to take medicines, such as the following:  Antibiotics.  Acne medicines.  Medicines to block or suppress the immune system.  A diabetes medicine (metformin) is sometimes used to treat this condition.  For women, birth control pills can sometimes help relieve symptoms. You may need surgery if you have a severe case of hidradenitis suppurativa that does not respond to medicine. Surgery may involve:   Using a laser to clear the skin and remove hair follicles.  Opening and draining deep sores.  Removing the areas of skin that are diseased and scarred. HOME CARE INSTRUCTIONS  Learn as much as you can about your disease, and work closely with your health care providers.  Take medicines only as directed by your health care provider.  If you were prescribed an antibiotic medicine, finish it all even if you start to feel better.  If you are  overweight, losing weight may be very helpful. Try to reach and maintain a healthy weight.  Do not use any tobacco products, including cigarettes, chewing tobacco, or electronic cigarettes. If you need help quitting, ask your health care provider.  Do not shave the areas where you get hidradenitis suppurativa.  Do not wear deodorant.  Wear loose-fitting  clothes.  Try not to overheat and get sweaty.  Take a daily bleach bath as directed by your health care provider.  Fill your bathtub halfway with water.  Pour in  cup of unscented household bleach.  Soak for 5-10 minutes.  Cover sore areas with a warm, clean washcloth (compress) for 5-10 minutes. SEEK MEDICAL CARE IF:   You have a flare-up of hidradenitis suppurativa.  You have chills or a fever.  You are having trouble controlling your symptoms at home.   This information is not intended to replace advice given to you by your health care provider. Make sure you discuss any questions you have with your health care provider.   Document Released: 02/20/2004 Document Revised: 07/29/2014 Document Reviewed: 10/08/2013 Elsevier Interactive Patient Education 2016 [employer].   Diabetic Ketoacidosis Diabetic ketoacidosis is a life-threatening complication of diabetes. If it is not treated, it can cause severe dehydration and organ damage and can lead to a coma or death. CAUSES This condition develops when there is not enough of the hormone insulin in the body. Insulin helps the body to break down sugar for energy. Without insulin, the body cannot break down sugar, so it breaks down fats instead. This leads to the production of acids that are called ketones. Ketones are poisonous at high levels. This condition can be triggered by:  Stress on the body that is brought on by an illness.  Medicines that raise blood glucose levels.  Not taking diabetes medicine. SYMPTOMS Symptoms of this condition include:  Fatigue.  Weight loss.  Excessive thirst.  Light-headedness.  Fruity or sweet-smelling breath.  Excessive urination.  Vision changes.  Confusion or irritability.  Nausea.  Vomiting.  Rapid breathing.  Abdominal pain.  Feeling flushed. DIAGNOSIS This condition is diagnosed based on a medical history, a physical exam, and blood tests. You may also have a  urine test that checks for ketones. TREATMENT This condition may be treated with:  Fluid replacement. This may be done to correct dehydration.  Insulin injections. These may be given through the skin or through an IV tube.  Electrolyte replacement. Electrolytes, such as potassium and sodium, may be given in pill form or through an IV tube.  Antibiotic medicines. These may be prescribed if your condition was caused by an infection. HOME CARE INSTRUCTIONS Eating and Drinking  Drink enough fluids to keep your urine clear or pale yellow.  If you cannot eat, alternate between drinking fluids with sugar (such as juice) and salty fluids (such as broth or bouillon).  If you can eat, follow your usual diet and drink sugar-free liquids, such as water. Other Instructions  Take insulin as directed by your health care provider. Do not skip insulin injections. Do not use expired insulin.  If your blood sugar is over 240 mg/dL, monitor your urine ketones every 4-6 hours.  If you were prescribed an antibiotic medicine, finish all of it even if you start to feel better.  Rest and exercise only as directed by your health care provider.  If you get sick, call your health care provider and begin treatment quickly. Your body often needs extra insulin to fight  an illness.  Check your blood glucose levels regularly. If your blood glucose is high, drink plenty of fluids. This helps to flush out ketones. SEEK MEDICAL CARE IF:  Your blood glucose level is too high or too low.  You have ketones in your urine.  You have a fever.  You cannot eat.  You cannot tolerate fluids.  You have been vomiting for more than 2 hours.  You continue to have symptoms of this condition.  You develop new symptoms. SEEK IMMEDIATE MEDICAL CARE IF:  Your blood glucose levels continue to be high (elevated).  Your monitor reads "high" even when you are taking insulin.  You faint.  You have chest pain.  You  have trouble breathing.  You have a sudden, severe headache.  You have sudden weakness in one arm or one leg.  You have sudden trouble speaking or swallowing.  You have vomiting or diarrhea that gets worse after 3 hours.  You feel severely fatigued.  You have trouble thinking.  You have abdominal pain.  You are severely dehydrated. Symptoms of severe dehydration include:  Extreme thirst.  Dry mouth.  Blue lips.  Cold hands and feet.  Rapid breathing.   This information is not intended to replace advice given to you by your health care provider. Make sure you discuss any questions you have with your health care provider.   Document Released: 07/05/2000 Document Revised: 11/22/2014 Document Reviewed: 06/15/2014 Elsevier Interactive Patient Education [employer] Elsevier Inc.

## 2015-06-07 NOTE — Progress Notes (Signed)
   06/07/15 0925  Clinical Encounter Type  Visited With Patient not available  Visit Type Initial  Referral From Nurse  Consult/Referral To Chaplain  Attempted to visit with patient as per nurse request.  Nurse told me that pt appeared to her to be a bit depressed and not responsive. Pt appeared to be asleep when I went to visit him. Will attempt to visit again later today.  Asbury Automotive GroupChaplain Daiquan Resnik-pager [phone]

## 2015-06-07 NOTE — Progress Notes (Signed)
Pt being discharged home, discharge reviewed with pt, prescription reviewed with pt, also reviewed follow up appts that need to be made by pt, also [facility], states understanding, no noted complaints at discharge

## 2015-06-07 NOTE — Care Management (Signed)
Admitted to [facility] with the diagnosis of Diabetic Ketoacidosis. Lives with father Trevor Brown [phone]). Gets medications  at [facility] and [facility].  Works as a Geophysicist/field seismologistTree Worker. Doesn't drive.  Gets a ride to work. Information on dental linics that take Medicaid given to Trevor Brown. Discharge to home today per Dr. Sherryll BurgerShah. Trying to arrange a ride home. States that his father doesn't get off work until 4:00pm this afternoon.  Trevor GreetBrenda S Azavier Creson RN MSN CCM Care Management [phone]

## 2015-06-07 NOTE — Plan of Care (Signed)
Pt had been transferred from floor from ICU after having been on insulin drip for DKA.  No further N&V.  FBS still elevated. Had also had infected pustules in L axilla - I&D done in ED.  Pt states that although he has meds available thru med mgmt, he can't get there b/c of transportation issues.  Poor oral hygiene - pt having pain in gums and teeth.  Tried to give him info on free dental clinic at [facility]; also tried to have him see dietician and diabetes educator but he was eager to get home b/c ride was available.  Blood sugar was elevated at 341 - he had already left floor;  We tried to get him back and he sd he'd cover himeself at home. Pt to f/u w/[facility] in 1 wk.

## 2015-06-07 NOTE — Progress Notes (Signed)
Inpatient Diabetes Program Recommendations  AACE/ADA: New Consensus Statement on Inpatient Glycemic Control (2015)  Target Ranges:  Prepandial:   less than 140 mg/dL      Peak postprandial:   less than 180 mg/dL (1-2 hours)      Critically ill patients:  140 - 180 mg/dL  Results for Trevor Brown, Samvel B (MRN [id number]) as of 06/07/2015 07:33  Ref. Range 06/07/2015 01:34 06/07/2015 02:32 06/07/2015 03:31 06/07/2015 04:35 06/07/2015 05:38  Glucose-Capillary Latest Ref Range: 65-99 mg/dL 97 85 79 409100 (H) 811251 (H)   Results for Trevor Brown, [name] (MRN [id number]) as of 06/07/2015 07:33  Ref. Range 12/10/2014 04:27  Hemoglobin A1C Latest Ref Range: 4.0-6.0 % 14.3 (H)   Review of Glycemic Control  Diabetes history: DM1 Outpatient Diabetes medications: Lantus 20 units QHS, Novolog 5 units TID with meals Current orders for Inpatient glycemic control: Lantus 12 units Q24H, Novolog 0-9 units Q4H  Inpatient Diabetes Program Recommendations: Insulin - Basal: Please consider increasing Lantus ot 15 units Q24H (based on 50 kg x 0.3 units). Correction (SSI): Once diet is ordered, please consider changing CBGs and Novolog frequency to ACHS. Insulin - Meal Coverage: Once Carb Modified diet is ordered patient will require insulin for meal coverage since he has Type1 diabetes and makes no insulin. Once diet is started, please consider ordering Novolog 4 units TID with meals for meal coverage (in addition to Novolog correction scale). HgbA1C: Please consider ordering an A1C to evaluate glycemic control over the past 2-3 months.  Thanks, Orlando PennerMarie Marquett Bertoli, RN, MSN, CDE Diabetes Coordinator Inpatient Diabetes Program [phone] (Team Pager from 8am to 5pm) [phone] ([facility] office) [phone] [facility] office) [phone] [facility] office)

## 2015-06-09 NOTE — Care Management (Signed)
Return call from patient.  Medicaid that is active is for family planning only.  I spoke with [facility].  They will fill his medications today.  Spoke with patient and notified him that he would be able to get his medicine at [facility] and they would be open until 430 pm today.  His brother is to pick up the medications

## 2015-06-09 NOTE — Care Management (Signed)
Notified that patient left message regarding not being able to obtain his insulin from [facility].  I spoke with Sitka Sinkita at [facility] management and patient now has active Medicaid.  I called and spoke with patient at [phone].  Patient was not aware that his Medicaid was active, however he has is card in hand.  Patient's brother is to take script and medicaid card to pharmacy to fill.  I informed patient that he would have a copay

## 2015-06-10 ENCOUNTER — Inpatient Hospital Stay
Admission: EM | Admit: 2015-06-10 | Discharge: 2015-06-12 | DRG: 638 | Disposition: A | Discharge status: Home / Self Care | Payer: Self-pay | Attending: Specialist | Admitting: Specialist

## 2015-06-10 DIAGNOSIS — I998 Other disorder of circulatory system: Secondary | ICD-10-CM | POA: Diagnosis present

## 2015-06-10 DIAGNOSIS — B1920 Unspecified viral hepatitis C without hepatic coma: Secondary | ICD-10-CM | POA: Diagnosis present

## 2015-06-10 DIAGNOSIS — IMO0001 Reserved for inherently not codable concepts without codable children: Secondary | ICD-10-CM

## 2015-06-10 DIAGNOSIS — Z833 Family history of diabetes mellitus: Secondary | ICD-10-CM

## 2015-06-10 DIAGNOSIS — E876 Hypokalemia: Secondary | ICD-10-CM | POA: Diagnosis present

## 2015-06-10 DIAGNOSIS — E1065 Type 1 diabetes mellitus with hyperglycemia: Secondary | ICD-10-CM

## 2015-06-10 DIAGNOSIS — Z9119 Patient's noncompliance with other medical treatment and regimen: Secondary | ICD-10-CM

## 2015-06-10 DIAGNOSIS — E1110 Type 2 diabetes mellitus with ketoacidosis without coma: Secondary | ICD-10-CM | POA: Diagnosis present

## 2015-06-10 DIAGNOSIS — Z91030 Bee allergy status: Secondary | ICD-10-CM

## 2015-06-10 DIAGNOSIS — K0889 Other specified disorders of teeth and supporting structures: Secondary | ICD-10-CM | POA: Diagnosis present

## 2015-06-10 DIAGNOSIS — F17210 Nicotine dependence, cigarettes, uncomplicated: Secondary | ICD-10-CM | POA: Diagnosis present

## 2015-06-10 DIAGNOSIS — Z91199 Patient's noncompliance with other medical treatment and regimen due to unspecified reason: Secondary | ICD-10-CM

## 2015-06-10 DIAGNOSIS — Z880 Allergy status to penicillin: Secondary | ICD-10-CM

## 2015-06-10 DIAGNOSIS — E870 Hyperosmolality and hypernatremia: Secondary | ICD-10-CM | POA: Diagnosis present

## 2015-06-10 DIAGNOSIS — I252 Old myocardial infarction: Secondary | ICD-10-CM

## 2015-06-10 DIAGNOSIS — L732 Hidradenitis suppurativa: Secondary | ICD-10-CM | POA: Insufficient documentation

## 2015-06-10 DIAGNOSIS — Z794 Long term (current) use of insulin: Secondary | ICD-10-CM

## 2015-06-10 DIAGNOSIS — E1010 Type 1 diabetes mellitus with ketoacidosis without coma: Principal | ICD-10-CM | POA: Diagnosis present

## 2015-06-10 DIAGNOSIS — Z9114 Patient's other noncompliance with medication regimen: Secondary | ICD-10-CM

## 2015-06-10 DIAGNOSIS — Z881 Allergy status to other antibiotic agents status: Secondary | ICD-10-CM

## 2015-06-10 DIAGNOSIS — Z886 Allergy status to analgesic agent status: Secondary | ICD-10-CM

## 2015-06-10 LAB — BASIC METABOLIC PANEL
Anion gap: 20 — ABNORMAL HIGH (ref 5–15)
Anion gap: 10 (ref 5–15)
Anion gap: 7 (ref 5–15)
Anion gap: 11 (ref 5–15)
BUN: 22 mg/dL — ABNORMAL HIGH (ref 6–20)
BUN: 20 mg/dL (ref 6–20)
BUN: 17 mg/dL (ref 6–20)
BUN: 17 mg/dL (ref 6–20)
BUN: 17 mg/dL (ref 6–20)
CO2: <5 mmol/L — ABNORMAL LOW (ref 22–32)
CO2: 8 mmol/L — ABNORMAL LOW (ref 22–32)
CO2: 13 mmol/L — ABNORMAL LOW (ref 22–32)
CO2: 17 mmol/L — ABNORMAL LOW (ref 22–32)
CO2: 15 mmol/L — ABNORMAL LOW (ref 22–32)
Calcium: 9.7 mg/dL (ref 8.9–10.3)
Calcium: 8.5 mg/dL — ABNORMAL LOW (ref 8.9–10.3)
Calcium: 8.5 mg/dL — ABNORMAL LOW (ref 8.9–10.3)
Calcium: 8.7 mg/dL — ABNORMAL LOW (ref 8.9–10.3)
Calcium: 8.7 mg/dL — ABNORMAL LOW (ref 8.9–10.3)
Chloride: 98 mmol/L — ABNORMAL LOW (ref 101–111)
Chloride: 106 mmol/L (ref 101–111)
Chloride: 111 mmol/L (ref 101–111)
Chloride: 110 mmol/L (ref 101–111)
Chloride: 107 mmol/L (ref 101–111)
Creatinine, Ser: 1.2 mg/dL (ref 0.61–1.24)
Creatinine, Ser: 1.2 mg/dL (ref 0.61–1.24)
Creatinine, Ser: 0.79 mg/dL (ref 0.61–1.24)
Creatinine, Ser: 0.82 mg/dL (ref 0.61–1.24)
Creatinine, Ser: 0.85 mg/dL (ref 0.61–1.24)
GFR calc Af Amer: >60 mL/min (ref >60)
GFR calc Af Amer: >60 mL/min (ref >60)
GFR calc Af Amer: >60 mL/min (ref >60)
GFR calc Af Amer: >60 mL/min (ref >60)
GFR calc Af Amer: >60 mL/min (ref >60)
GFR calc non Af Amer: >60 mL/min (ref >60)
GFR calc non Af Amer: >60 mL/min (ref >60)
GFR calc non Af Amer: >60 mL/min (ref >60)
GFR calc non Af Amer: >60 mL/min (ref >60)
GFR calc non Af Amer: >60 mL/min (ref >60)
Glucose, Bld: 412 mg/dL — ABNORMAL HIGH (ref 65–99)
Glucose, Bld: 279 mg/dL — ABNORMAL HIGH (ref 65–99)
Glucose, Bld: 172 mg/dL — ABNORMAL HIGH (ref 65–99)
Glucose, Bld: 109 mg/dL — ABNORMAL HIGH (ref 65–99)
Glucose, Bld: 165 mg/dL — ABNORMAL HIGH (ref 65–99)
Potassium: 4.7 mmol/L (ref 3.5–5.1)
Potassium: 4.8 mmol/L (ref 3.5–5.1)
Potassium: 4.2 mmol/L (ref 3.5–5.1)
Potassium: 4.3 mmol/L (ref 3.5–5.1)
Potassium: 4.4 mmol/L (ref 3.5–5.1)
Sodium: 131 mmol/L — ABNORMAL LOW (ref 135–145)
Sodium: 134 mmol/L — ABNORMAL LOW (ref 135–145)
Sodium: 134 mmol/L — ABNORMAL LOW (ref 135–145)
Sodium: 134 mmol/L — ABNORMAL LOW (ref 135–145)
Sodium: 133 mmol/L — ABNORMAL LOW (ref 135–145)

## 2015-06-10 LAB — GLUCOSE, CAPILLARY
Glucose-Capillary: 381 mg/dL — ABNORMAL HIGH (ref 65–99)
Glucose-Capillary: 336 mg/dL — ABNORMAL HIGH (ref 65–99)
Glucose-Capillary: 303 mg/dL — ABNORMAL HIGH (ref 65–99)
Glucose-Capillary: 323 mg/dL — ABNORMAL HIGH (ref 65–99)
Glucose-Capillary: 237 mg/dL — ABNORMAL HIGH (ref 65–99)
Glucose-Capillary: 211 mg/dL — ABNORMAL HIGH (ref 65–99)
Glucose-Capillary: 185 mg/dL — ABNORMAL HIGH (ref 65–99)
Glucose-Capillary: 180 mg/dL — ABNORMAL HIGH (ref 65–99)
Glucose-Capillary: 137 mg/dL — ABNORMAL HIGH (ref 65–99)
Glucose-Capillary: 154 mg/dL — ABNORMAL HIGH (ref 65–99)
Glucose-Capillary: 125 mg/dL — ABNORMAL HIGH (ref 65–99)
Glucose-Capillary: 114 mg/dL — ABNORMAL HIGH (ref 65–99)
Glucose-Capillary: 155 mg/dL — ABNORMAL HIGH (ref 65–99)
Glucose-Capillary: 154 mg/dL — ABNORMAL HIGH (ref 65–99)
Glucose-Capillary: 193 mg/dL — ABNORMAL HIGH (ref 65–99)
Glucose-Capillary: 201 mg/dL — ABNORMAL HIGH (ref 65–99)

## 2015-06-10 LAB — URINALYSIS COMPLETE WITH MICROSCOPIC (ARMC ONLY)
Bacteria, UA: NONE SEEN
Bilirubin Urine: NEGATIVE
Glucose, UA: >500 mg/dL — AB
Leukocytes, UA: NEGATIVE
Nitrite: NEGATIVE
Protein, ur: 100 mg/dL — AB
Specific Gravity, Urine: 1.026 (ref 1.005–1.030)
Squamous Epithelial / LPF: NONE SEEN
pH: 5 (ref 5.0–8.0)

## 2015-06-10 LAB — CBC
HCT: 51.6 % (ref 40.0–52.0)
Hemoglobin: 16.7 g/dL (ref 13.0–18.0)
MCH: 29.9 pg (ref 26.0–34.0)
MCHC: 32.5 g/dL (ref 32.0–36.0)
MCV: 92 fL (ref 80.0–100.0)
Platelets: 247 10*3/uL (ref 150–440)
RBC: 5.61 MIL/uL (ref 4.40–5.90)
RDW: 13 % (ref 11.5–14.5)
WBC: 11.4 10*3/uL — ABNORMAL HIGH (ref 3.8–10.6)

## 2015-06-10 LAB — BLOOD GAS, VENOUS
Acid-base deficit: 23.7 mmol/L — ABNORMAL HIGH (ref 0.0–2.0)
Bicarbonate: 6.1 mEq/L — ABNORMAL LOW (ref 21.0–28.0)
Patient temperature: 37
pCO2, Ven: 24 mmHg — ABNORMAL LOW (ref 44.0–60.0)
pH, Ven: 7.01 — CL (ref 7.320–7.430)

## 2015-06-10 LAB — MAGNESIUM: Magnesium: 2.1 mg/dL (ref 1.7–2.4)

## 2015-06-10 LAB — MRSA PCR SCREENING: MRSA by PCR: POSITIVE — AB

## 2015-06-10 LAB — BETA-HYDROXYBUTYRIC ACID: Beta-Hydroxybutyric Acid: >8 mmol/L — ABNORMAL HIGH (ref 0.05–0.27)

## 2015-06-10 MED ORDER — MORPHINE SULFATE (PF) 2 MG/ML IV SOLN
2.0000 mg | INTRAVENOUS | Status: DC | PRN
  Administered 2015-06-10 – 2015-06-12 (×9): 2 mg via INTRAVENOUS
  Filled 2015-06-10 (×9): qty 1

## 2015-06-10 MED ORDER — CETYLPYRIDINIUM CHLORIDE 0.05 % MT LIQD
7.0000 mL | Freq: Two times a day (BID) | OROMUCOSAL | Status: DC
  Administered 2015-06-10 – 2015-06-11 (×4): 7 mL via OROMUCOSAL

## 2015-06-10 MED ORDER — POTASSIUM CHLORIDE 2 MEQ/ML IV SOLN: INTRAVENOUS | Status: DC

## 2015-06-10 MED ORDER — INSULIN ASPART 100 UNIT/ML ~~LOC~~ SOLN
0.0000 [IU] | Freq: Every day | SUBCUTANEOUS | Status: DC
  Administered 2015-06-10: 2 [IU] via SUBCUTANEOUS
  Filled 2015-06-10: qty 2

## 2015-06-10 MED ORDER — SODIUM CHLORIDE 0.9 % IV SOLN
INTRAVENOUS | Status: AC
  Administered 2015-06-10: 09:00:00 via INTRAVENOUS

## 2015-06-10 MED ORDER — SODIUM CHLORIDE 0.9 % IV SOLN
INTRAVENOUS | Status: DC
  Administered 2015-06-10: 10:00:00 via INTRAVENOUS

## 2015-06-10 MED ORDER — POTASSIUM CHLORIDE 10 MEQ/100ML IV SOLN
10.0000 meq | INTRAVENOUS | Status: AC
  Administered 2015-06-10 (×2): 10 meq via INTRAVENOUS
  Filled 2015-06-10 (×2): qty 100

## 2015-06-10 MED ORDER — MORPHINE SULFATE (PF) 2 MG/ML IV SOLN
2.0000 mg | INTRAVENOUS | Status: DC | PRN
  Administered 2015-06-10: 2 mg via INTRAVENOUS
  Administered 2015-06-10: 4 mg via INTRAVENOUS
  Filled 2015-06-10: qty 1
  Filled 2015-06-10: qty 2

## 2015-06-10 MED ORDER — PANTOPRAZOLE SODIUM 40 MG IV SOLR
INTRAVENOUS | Status: AC
  Administered 2015-06-10: 40 mg via INTRAVENOUS
  Filled 2015-06-10: qty 40

## 2015-06-10 MED ORDER — CLINDAMYCIN PHOSPHATE 600 MG/50ML IV SOLN
600.0000 mg | Freq: Three times a day (TID) | INTRAVENOUS | Status: DC
  Administered 2015-06-10 – 2015-06-12 (×6): 600 mg via INTRAVENOUS
  Filled 2015-06-10 (×9): qty 50

## 2015-06-10 MED ORDER — CLINDAMYCIN PHOSPHATE 600 MG/50ML IV SOLN
600.0000 mg | Freq: Once | INTRAVENOUS | Status: AC
  Administered 2015-06-10: 600 mg via INTRAVENOUS
  Filled 2015-06-10: qty 50

## 2015-06-10 MED ORDER — SODIUM CHLORIDE 0.9 % IV BOLUS (SEPSIS)
1000.0000 mL | Freq: Once | INTRAVENOUS | Status: AC
  Administered 2015-06-10: 1000 mL via INTRAVENOUS

## 2015-06-10 MED ORDER — PROMETHAZINE HCL 25 MG/ML IJ SOLN
12.5000 mg | Freq: Once | INTRAMUSCULAR | Status: AC
Start: 2015-06-10 — End: 2015-06-10
  Administered 2015-06-10: 12.5 mg via INTRAVENOUS
  Filled 2015-06-10: qty 1

## 2015-06-10 MED ORDER — HYDROCODONE-ACETAMINOPHEN 5-325 MG PO TABS
1.0000 | ORAL_TABLET | ORAL | Status: DC | PRN
  Administered 2015-06-10 – 2015-06-12 (×10): 1 via ORAL
  Filled 2015-06-10 (×10): qty 1

## 2015-06-10 MED ORDER — ONDANSETRON HCL 4 MG/2ML IJ SOLN
4.0000 mg | Freq: Once | INTRAMUSCULAR | Status: AC
  Administered 2015-06-10: 4 mg via INTRAVENOUS
  Filled 2015-06-10: qty 2

## 2015-06-10 MED ORDER — INSULIN REGULAR HUMAN 100 UNIT/ML IJ SOLN: INTRAMUSCULAR | Status: DC

## 2015-06-10 MED ORDER — ENOXAPARIN SODIUM 40 MG/0.4ML ~~LOC~~ SOLN
40.0000 mg | SUBCUTANEOUS | Status: DC
  Administered 2015-06-10 – 2015-06-12 (×3): 40 mg via SUBCUTANEOUS
  Filled 2015-06-10 (×3): qty 0.4

## 2015-06-10 MED ORDER — SODIUM CHLORIDE 0.9 % IV SOLN
INTRAVENOUS | Status: DC
  Administered 2015-06-10: 6 [IU]/h via INTRAVENOUS
  Administered 2015-06-10: 07:00:00 via INTRAVENOUS
  Administered 2015-06-10: 6 [IU]/h via INTRAVENOUS
  Administered 2015-06-10: 5.3 [IU]/h via INTRAVENOUS
  Filled 2015-06-10: qty 2.5

## 2015-06-10 MED ORDER — PANTOPRAZOLE SODIUM 40 MG IV SOLR
40.0000 mg | Freq: Once | INTRAVENOUS | Status: AC
  Administered 2015-06-10: 40 mg via INTRAVENOUS

## 2015-06-10 MED ORDER — PROMETHAZINE HCL 25 MG/ML IJ SOLN
12.5000 mg | Freq: Once | INTRAMUSCULAR | Status: AC
  Administered 2015-06-10: 12.5 mg via INTRAVENOUS
  Filled 2015-06-10: qty 1

## 2015-06-10 MED ORDER — DEXTROSE-NACL 5-0.45 % IV SOLN
INTRAVENOUS | Status: DC
  Administered 2015-06-10: 10:00:00 via INTRAVENOUS

## 2015-06-10 MED ORDER — INSULIN GLARGINE 100 UNIT/ML ~~LOC~~ SOLN
10.0000 [IU] | Freq: Once | SUBCUTANEOUS | Status: AC
  Administered 2015-06-10: 10 [IU] via SUBCUTANEOUS
  Filled 2015-06-10: qty 0.1

## 2015-06-10 MED ORDER — KCL IN DEXTROSE-NACL 20-5-0.45 MEQ/L-%-% IV SOLN
INTRAVENOUS | Status: DC
  Administered 2015-06-10: 15:00:00 via INTRAVENOUS
  Filled 2015-06-10 (×2): qty 1000

## 2015-06-10 MED ORDER — INSULIN ASPART 100 UNIT/ML ~~LOC~~ SOLN
0.0000 [IU] | Freq: Three times a day (TID) | SUBCUTANEOUS | Status: DC
  Administered 2015-06-11: 8 [IU] via SUBCUTANEOUS
  Administered 2015-06-11: 3 [IU] via SUBCUTANEOUS
  Administered 2015-06-11: 5 [IU] via SUBCUTANEOUS
  Administered 2015-06-12: 8 [IU] via SUBCUTANEOUS
  Filled 2015-06-10: qty 3
  Filled 2015-06-10: qty 8
  Filled 2015-06-10: qty 5
  Filled 2015-06-10: qty 8

## 2015-06-10 NOTE — ED Provider Notes (Signed)
[facility] Emergency Department Provider Note  ____________________________________________  Time seen: Approximately 4:51 AM  I have reviewed the triage vital signs and the nursing notes.   HISTORY  Chief Complaint Hyperglycemia    HPI Trevor Brown is a 24 y.o. male who presents to the ED from home via EMS for chief complaint ofhyperglycemia. Patient is a type I diabetic with prior history of DKA. He was most recently admitted and discharged from our facility 2 days ago for DKA. He had abscesses I&D under his left axilla and discharged with a prescription for antibiotics as well as insulin. Despite care management's plans for patient to receive his prescriptions, patient failed to get them. He returns to the ED feeling nauseated and weak having dry mouth, pain in left axilla, dental pain and abdominal pain. Denies fever, chills, chest pain, shortness of breath. Nothing makes his symptoms better or worse.   Past Medical History  Diagnosis Date  . Diabetes mellitus without complication (HCC)   . Hepatitis C, acute may 2016  . Heart attack (HCC)     Pt claims he had heart attack a year ago ( 2015)- and was admitted in [facility] for that, but not given any meds or angiogram, on review of chart- I could not find any details like that.    Patient Active Problem List   Diagnosis Date Noted  . DKA, type 1 (HCC) 06/06/2015  . Malnutrition of moderate degree (HCC) 04/08/2015  . Hepatitis C 12/13/2014  . Diabetes type 1, uncontrolled (HCC) 12/11/2014  . Major depressive disorder, single episode, mild (HCC)   . Major depression, single episode 12/10/2014  . Diabetes mellitus type 1, uncontrolled (HCC) 12/09/2014  . Transaminitis 12/09/2014  . Folliculitis 12/09/2014  . Homelessness 12/09/2014    History reviewed. No pertinent past surgical history.  Current Outpatient Rx  Name  Route  Sig  Dispense  Refill  . oxyCODONE (OXY IR/ROXICODONE) 5 MG immediate release  tablet   Oral   Take 1 tablet (5 mg total) by mouth 2 (two) times daily as needed for moderate pain.   10 tablet   0   . clindamycin (CLEOCIN) 150 MG capsule   Oral   Take 3 capsules (450 mg total) by mouth 3 (three) times daily.   90 capsule   0   . insulin aspart (NOVOLOG) 100 UNIT/ML injection   Subcutaneous   Inject 5 Units into the skin 3 (three) times daily with meals.   10 mL   0   . insulin glargine (LANTUS) 100 UNIT/ML injection   Subcutaneous   Inject 0.2 mLs (20 Units total) into the skin at bedtime.   10 mL   0     Allergies Bee venom; Penicillins; Ibuprofen; Tramadol; and Vancomycin  Family History  Problem Relation Age of Onset  . Cirrhosis Mother   . Diabetes Mellitus II Maternal Grandmother     Social History Social History  Substance Use Topics  . Smoking status: Current Every Day Smoker -- 1.50 packs/day    Types: Cigarettes  . Smokeless tobacco: None  . Alcohol Use: No    Review of Systems Constitutional: Positive for generalized malaise. No fever/chills. Eyes: No visual changes. ENT: No sore throat. Cardiovascular: Denies chest pain. Respiratory: Denies shortness of breath. Gastrointestinal: Positive for abdominal pain.  Positive for nausea and vomiting.  No diarrhea.  No constipation. Genitourinary: Negative for dysuria. Musculoskeletal: Negative for back pain. Skin: Negative for rash. Neurological: Negative for headaches, focal  weakness or numbness.  10-point ROS otherwise negative.  ____________________________________________   PHYSICAL EXAM:  VITAL SIGNS: ED Triage Vitals  Enc Vitals Group     BP 06/10/15 0406 130/84 mmHg     Pulse Rate 06/10/15 0406 100     Resp 06/10/15 0406 26     Temp 06/10/15 0406 97.7 F (36.5 C)     Temp Source 06/10/15 0406 Oral     SpO2 06/10/15 0406 100 %     Weight 06/10/15 0406 120 lb (54.432 kg)     Height 06/10/15 0406  (1.626 m)     Head Cir --      Peak Flow --      Pain Score  --      Pain Loc --      Pain Edu? --      Excl. in GC? --     Constitutional: Asleep; awakens for exam. Alert and oriented. Chronically ill-appearing and in no acute distress. Eyes: Conjunctivae are normal. PERRL. EOMI. Head: Atraumatic. Nose: No congestion/rhinnorhea. Mouth/Throat: Mucous membranes are mildly dry.  Oropharynx non-erythematous. Neck: No stridor.   Cardiovascular: Tachycardic rate, regular rhythm. Grossly normal heart sounds.  Good peripheral circulation. Respiratory: Normal respiratory effort.  No retractions. Lungs CTAB. Gastrointestinal: Soft and nontender. No distention. No abdominal bruits. No CVA tenderness. Musculoskeletal: Small amount of purulence draining from incision site to left axilla. There is no surrounding area of warmth, erythema or fluctuance. No lower extremity tenderness nor edema.  No joint effusions. Neurologic:  Normal speech and language. No gross focal neurologic deficits are appreciated.  Skin:  Skin is warm, and intact. No rash noted. Psychiatric: Mood and affect are normal. Speech and behavior are normal.  ____________________________________________   LABS (all labs ordered are listed, but only abnormal results are displayed)  Labs Reviewed  BASIC METABOLIC PANEL - Abnormal; Notable for the following:    Sodium 131 (*)    Chloride 98 (*)    CO2 <5 (*)    Glucose, Bld 412 (*)    BUN 22 (*)    All other components within normal limits  CBC - Abnormal; Notable for the following:    WBC 11.4 (*)    All other components within normal limits  URINALYSIS COMPLETEWITH MICROSCOPIC ([facility] ONLY) - Abnormal; Notable for the following:    Color, Urine STRAW (*)    APPearance CLEAR (*)    Glucose, UA >500 (*)    Ketones, ur 2+ (*)    Hgb urine dipstick 1+ (*)    Protein, ur 100 (*)    All other components within normal limits  BLOOD GAS, VENOUS - Abnormal; Notable for the following:    pH, Ven 7.01 (*)    pCO2, Ven 24 (*)    Bicarbonate  6.1 (*)    Acid-base deficit 23.7 (*)    All other components within normal limits  CULTURE, BLOOD (ROUTINE X 2)  CULTURE, BLOOD (ROUTINE X 2)  BETA-HYDROXYBUTYRIC ACID  CBG MONITORING, ED   ____________________________________________  EKG  None ____________________________________________  RADIOLOGY  None ____________________________________________   PROCEDURES  Procedure(s) performed: None  Critical Care performed: Yes, see critical care note(s)   CRITICAL CARE Performed by: Irean Hong   Total critical care time: 30 minutes  Critical care time was exclusive of separately billable procedures and treating other patients.  Critical care was necessary to treat or prevent imminent or life-threatening deterioration.  Critical care was time spent personally by me on the following  activities: development of treatment plan with patient and/or surrogate as well as nursing, discussions with consultants, evaluation of patient's response to treatment, examination of patient, obtaining history from patient or surrogate, ordering and performing treatments and interventions, ordering and review of laboratory studies, ordering and review of radiographic studies, pulse oximetry and re-evaluation of patient's condition.  ____________________________________________   INITIAL IMPRESSION / ASSESSMENT AND PLAN / ED COURSE  Pertinent labs & imaging results that were available during my care of the patient were reviewed by me and considered in my medical decision making (see chart for details).  24 year old male, history of IDDM who returns to the ED with acute DKA secondary to medication nonadherence. Will initiate IV fluid resuscitation, empiric antibiotic treatment for axillary abscess which was recently drained and initiate insulin drip.  ----------------------------------------- 5:23 AM on 06/10/2015 -----------------------------------------  Laboratory and urinalysis results  noted. Discussed with hospitalist to evaluate patient in the emergency department for admission. ____________________________________________   FINAL CLINICAL IMPRESSION(S) / ED DIAGNOSES  Final diagnoses:  Type 1 diabetes mellitus with ketoacidosis without coma (HCC)  Hidradenitis suppurativa of left axilla  History of nonadherence to medical treatment      Irean HongJade J Sung, MD 06/10/15 [phone]

## 2015-06-10 NOTE — ED Notes (Signed)
Current blood glucose 323

## 2015-06-10 NOTE — Progress Notes (Signed)
eLink Physician-Brief Progress Note Patient Name: Trevor Brown DOB: 1991-05-16 MRN: [id number]   Date of Service  06/10/2015  HPI/Events of Note  Admitted with DKA. On insulin gtt. Anion gap has closed. BS~100.  eICU Interventions  Will give Lantus 10 units and stop insulin gtt in a couple of hours. On D5W.     Intervention Category Intermediate Interventions: Hyperglycemia - evaluation and treatment  Dorothyann Gibbsrag Orley Lawry 06/10/2015, 6:03 PM

## 2015-06-10 NOTE — ED Notes (Signed)
Collected blood cultures before starting antibiotic.

## 2015-06-10 NOTE — H&P (Addendum)
Trevor Brown is an 24 y.o. male.   Chief Complaint: Hyperglycemia HPI: The patient presents emergency department with hyperglycemia, abdominal pain nausea and vomiting. The latter has been nonbloody nonbilious. The patient was recently discharged from the ICU for DKA. Free medications had been arranged for the patient upon discharge but he reports not filling them or being able to get to follow-up appointments. Upon arrival to the emergency department his blood sugar was found to be septic and the elevated with an anion gap which prompted the emergency department staff to start insulin drip and call the hospitalist service for admission.  Past Medical History  Diagnosis Date  . Diabetes mellitus without complication ([place])   . Hepatitis C, acute may 2016  . Heart attack ([place])     Pt claims he had heart attack a year ago ( 2015)- and was admitted in [facility] for that, but not given any meds or angiogram, on review of chart- I could not find any details like that.    History reviewed. No pertinent past surgical history. None  Family History  Problem Relation Age of Onset  . Cirrhosis Mother   . Diabetes Mellitus II Maternal Grandmother    Social History:  reports that he has been smoking Cigarettes.  He has been smoking about 1.50 packs per day. He does not have any smokeless tobacco history on file. He reports that he does not drink alcohol or use illicit drugs.  Allergies:  Allergies  Allergen Reactions  . Bee Venom Anaphylaxis  . Penicillins Anaphylaxis, Hives and Other (See Comments)    Has patient had a PCN reaction causing immediate rash, facial/tongue/throat swelling, SOB or lightheadedness with hypotension: Yes Has patient had a PCN reaction causing severe rash involving mucus membranes or skin necrosis: No Has patient had a PCN reaction that required hospitalization No Has patient had a PCN reaction occurring within the last 10 years: Yes If all of the above answers are "NO", then  may proceed with Cephalosporin use.  . Ibuprofen Other (See Comments)    Reaction:  GI upset   . Tramadol Hives and Swelling  . Vancomycin Rash and Other (See Comments)    Reaction:  Red man's syndrome      (Not in a hospital admission)  Results for orders placed or performed during the hospital encounter of 06/10/15 (from the past 48 hour(s))  Basic metabolic panel     Status: Abnormal   Collection Time: 06/10/15  4:12 AM  Result Value Ref Range   Sodium 131 (L) 135 - 145 mmol/L   Potassium 4.7 3.5 - 5.1 mmol/L   Chloride 98 (L) 101 - 111 mmol/L   CO2 <5 (L) 22 - 32 mmol/L   Glucose, Bld 412 (H) 65 - 99 mg/dL   BUN 22 (H) 6 - 20 mg/dL   Creatinine, Ser 1.20 0.61 - 1.24 mg/dL   Calcium 9.7 8.9 - 10.3 mg/dL   GFR calc non Af Amer >60 >60 mL/min   GFR calc Af Amer >60 >60 mL/min    Comment: (NOTE) The eGFR has been calculated using the CKD EPI equation. This calculation has not been validated in all clinical situations. eGFR's persistently <60 mL/min signify possible Chronic Kidney Disease.    Anion gap SEE COMMENTS 5 - 15    Comment: UNABLE TO CALCULATE DUE TO LOW VALUE OF CO2  CBC     Status: Abnormal   Collection Time: 06/10/15  4:12 AM  Result Value Ref Range  WBC 11.4 (H) 3.8 - 10.6 K/uL   RBC 5.61 4.40 - 5.90 MIL/uL   Hemoglobin 16.7 13.0 - 18.0 g/dL   HCT 51.6 40.0 - 52.0 %   MCV 92.0 80.0 - 100.0 fL   MCH 29.9 26.0 - 34.0 pg   MCHC 32.5 32.0 - 36.0 g/dL   RDW 13.0 11.5 - 14.5 %   Platelets 247 150 - 440 K/uL  Beta-hydroxybutyric acid     Status: Abnormal   Collection Time: 06/10/15  4:12 AM  Result Value Ref Range   Beta-Hydroxybutyric Acid >8.00 (H) 0.05 - 0.27 mmol/L  Blood gas, venous     Status: Abnormal   Collection Time: 06/10/15  4:20 AM  Result Value Ref Range   pH, Ven 7.01 (LL) 7.320 - 7.430    Comment: CRITICAL RESULT CALLED TO, READ BACK BY AND VERIFIED WITH: CRITICAL RESULTS CALLED TO DR.SUNG AT 0501 ON 06/10/15    pCO2, Ven 24 (L) 44.0 -  60.0 mmHg   Bicarbonate 6.1 (L) 21.0 - 28.0 mEq/L   Acid-base deficit 23.7 (H) 0.0 - 2.0 mmol/L   Patient temperature 37.0    Collection site VENOUS    Sample type VENOUS   Urinalysis complete, with microscopic ([facility] only)     Status: Abnormal   Collection Time: 06/10/15  4:50 AM  Result Value Ref Range   Color, Urine STRAW (A) YELLOW   APPearance CLEAR (A) CLEAR   Glucose, UA >500 (A) NEGATIVE mg/dL   Bilirubin Urine NEGATIVE NEGATIVE   Ketones, ur 2+ (A) NEGATIVE mg/dL   Specific Gravity, Urine 1.026 1.005 - 1.030   Hgb urine dipstick 1+ (A) NEGATIVE   pH 5.0 5.0 - 8.0   Protein, ur 100 (A) NEGATIVE mg/dL   Nitrite NEGATIVE NEGATIVE   Leukocytes, UA NEGATIVE NEGATIVE   RBC / HPF 0-5 0 - 5 RBC/hpf   WBC, UA 0-5 0 - 5 WBC/hpf   Bacteria, UA NONE SEEN NONE SEEN   Squamous Epithelial / LPF NONE SEEN NONE SEEN   Mucous PRESENT    Hyaline Casts, UA PRESENT    No results found.  Review of Systems  Constitutional: Negative for fever and chills.  HENT: Negative for sore throat and tinnitus.   Eyes: Negative for blurred vision and redness.  Respiratory: Negative for cough and shortness of breath.   Cardiovascular: Negative for chest pain, palpitations, orthopnea and PND.  Gastrointestinal: Positive for nausea and vomiting. Negative for abdominal pain and diarrhea.  Genitourinary: Negative for dysuria, urgency and frequency.  Musculoskeletal: Negative for myalgias and joint pain.       Left flank pain  Skin: Negative for rash.       No lesions  Neurological: Negative for speech change, focal weakness and weakness.  Endo/Heme/Allergies: Does not bruise/bleed easily.       No temperature intolerance  Psychiatric/Behavioral: Negative for depression and suicidal ideas.    Blood pressure 143/88, pulse 92, temperature 97.7 F (36.5 C), temperature source Oral, resp. rate 26, height 5' 4"  (1.626 m), weight 54.432 kg (120 lb), SpO2 100 %. Physical Exam  Nursing note and vitals  reviewed. Constitutional: He is oriented to person, place, and time. He appears well-developed and well-nourished. No distress.  HENT:  Head: Normocephalic and atraumatic.  Mouth/Throat: Oropharynx is clear and moist.  Eyes: Conjunctivae and EOM are normal. Pupils are equal, round, and reactive to light. No scleral icterus.  Neck: Normal range of motion. Neck supple. No JVD present. No tracheal deviation  present. No thyromegaly present.  Cardiovascular: Normal rate, regular rhythm and normal heart sounds.  Exam reveals no gallop and no friction rub.   No murmur heard. Respiratory: Breath sounds normal. Tachypnea noted.  GI: Soft. Bowel sounds are normal. He exhibits no distension. There is no tenderness.  Genitourinary:  Deferred  Musculoskeletal: Normal range of motion. He exhibits no edema.  Lymphadenopathy:    He has no cervical adenopathy.  Neurological: He is alert and oriented to person, place, and time. No cranial nerve deficit.  Skin: Skin is warm and dry. No rash noted. No erythema.  Psychiatric: He has a normal mood and affect. His behavior is normal. Judgment and thought content normal.     Assessment/Plan This is a 24 year old Caucasian male admitted for DKA.  1. DKA: Without coma. The patient be started on insulin drip. He is nothing by mouth for now. Once his anion gap closes to be allowed to eat at which time we will start basal insulin and titrate the regular insulin drip to off. Currently the patient pH is 7.01. I started a bicarbonate drip. He is mildly tachypnic but not in need of mechanical ventilation at this time. 2. Hidradenitis suppurativa: Axillary lesions; this is a chronic problem. In the emergency department blood cultures were obtained and IV clindamycin started. I recommend obtaining infectious disease consult or dermatology consult for further guidelines regarding his chronic lesions that are exacerbated by his poorly manage blood sugar. 3. Hepatitis C: Stable.  Unclear if patient is receiving treatment at this time 4. DVT prophylaxis: Lovenox 5. GI prophylaxis: None The patient is a full code. Time spent on admission orders and critical patient care approximately 45 minutes  Harrie Foreman 06/10/2015, 7:02 AM

## 2015-06-10 NOTE — ED Notes (Signed)
POC [id number]

## 2015-06-10 NOTE — Consult Note (Signed)
Patient ID: Trevor Brown, male   DOB: [birth year], 24 y.o.   MRN: [id number]  Chief Complaint  Patient presents with  . Hyperglycemia    HPI Location, Quality, Duration, Severity, Timing, Context, Modifying Factors, Associated Signs and Symptoms.  Trevor Brown is a 24 y.o. male. Admitted with DKA and recently s/p I and D of left axillary abscess times 2.  One prior episode of I and D in the past.  Past Medical History  Diagnosis Date  . Diabetes mellitus without complication ([place])   . Hepatitis C, acute may 2016  . Heart attack ([place])     Pt claims he had heart attack a year ago ( 2015)- and was admitted in [facility] for that, but not given any meds or angiogram, on review of chart- I could not find any details like that.    Past surgical hx as above.   Family History  Problem Relation Age of Onset  . Cirrhosis Mother   . Diabetes Mellitus II Maternal Grandmother     Social History Social History  Substance Use Topics  . Smoking status: Current Every Day Smoker -- 1.50 packs/day    Types: Cigarettes  . Smokeless tobacco: None  . Alcohol Use: No    Allergies  Allergen Reactions  . Bee Venom Anaphylaxis  . Penicillins Anaphylaxis, Hives and Other (See Comments)    Has patient had a PCN reaction causing immediate rash, facial/tongue/throat swelling, SOB or lightheadedness with hypotension: Yes Has patient had a PCN reaction causing severe rash involving mucus membranes or skin necrosis: No Has patient had a PCN reaction that required hospitalization No Has patient had a PCN reaction occurring within the last 10 years: Yes If all of the above answers are "NO", then may proceed with Cephalosporin use.  . Ibuprofen Other (See Comments)    Reaction:  GI upset   . Tramadol Hives and Swelling  . Vancomycin Rash and Other (See Comments)    Reaction:  Red man's syndrome     Current Facility-Administered Medications  Medication Dose Route Frequency Provider Last Rate Last Dose   . 0.9 %  sodium chloride infusion   Intravenous Continuous Harrie Foreman, MD   Stopped at 06/10/15 1023  . antiseptic oral rinse (CPC / CETYLPYRIDINIUM CHLORIDE 0.05%) solution 7 mL  7 mL Mouth Rinse BID Henreitta Leber, MD   7 mL at 06/10/15 1123  . clindamycin (CLEOCIN) IVPB 600 mg  600 mg Intravenous 3 times per day Henreitta Leber, MD   600 mg at 06/10/15 1324  . dextrose 5 % and 0.45 % NaCl with KCl 20 mEq/L infusion   Intravenous Continuous Henreitta Leber, MD 75 mL/hr at 06/10/15 1519    . enoxaparin (LOVENOX) injection 40 mg  40 mg Subcutaneous Q24H Harrie Foreman, MD   40 mg at 06/10/15 1324  . insulin regular (NOVOLIN R,HUMULIN R) 250 Units in sodium chloride 0.9 % 250 mL (1 Units/mL) infusion   Intravenous Continuous Paulette Blanch, MD 2 mL/hr at 06/10/15 1500 2 Units/hr at 06/10/15 1500  . morphine 2 MG/ML injection 2-4 mg  2-4 mg Intravenous Q4H PRN Laverle Hobby, MD   4 mg at 06/10/15 1324    Blood pressure 116/65, pulse 80, temperature 97.8 F (36.6 C), temperature source Axillary, resp. rate 17, height $RemoveBe'5\' 4"'[id number]$  (1.626 m), weight 103 lb 2.8 oz (46.8 kg), SpO2 100 %.  Results for orders placed or performed during the hospital encounter of 06/10/15 (from  the past 48 hour(s))  Glucose, capillary     Status: Abnormal   Collection Time: 06/10/15  4:10 AM  Result Value Ref Range   Glucose-Capillary 381 (H) 65 - 99 mg/dL  Basic metabolic panel     Status: Abnormal   Collection Time: 06/10/15  4:12 AM  Result Value Ref Range   Sodium 131 (L) 135 - 145 mmol/L   Potassium 4.7 3.5 - 5.1 mmol/L   Chloride 98 (L) 101 - 111 mmol/L   CO2 <5 (L) 22 - 32 mmol/L   Glucose, Bld 412 (H) 65 - 99 mg/dL   BUN 22 (H) 6 - 20 mg/dL   Creatinine, Ser 1.20 0.61 - 1.24 mg/dL   Calcium 9.7 8.9 - 10.3 mg/dL   GFR calc non Af Amer >60 >60 mL/min   GFR calc Af Amer >60 >60 mL/min    Comment: (NOTE) The eGFR has been calculated using the CKD EPI equation. This calculation has not been  validated in all clinical situations. eGFR's persistently <60 mL/min signify possible Chronic Kidney Disease.    Anion gap SEE COMMENTS 5 - 15    Comment: UNABLE TO CALCULATE DUE TO LOW VALUE OF CO2  CBC     Status: Abnormal   Collection Time: 06/10/15  4:12 AM  Result Value Ref Range   WBC 11.4 (H) 3.8 - 10.6 K/uL   RBC 5.61 4.40 - 5.90 MIL/uL   Hemoglobin 16.7 13.0 - 18.0 g/dL   HCT 51.6 40.0 - 52.0 %   MCV 92.0 80.0 - 100.0 fL   MCH 29.9 26.0 - 34.0 pg   MCHC 32.5 32.0 - 36.0 g/dL   RDW 13.0 11.5 - 14.5 %   Platelets 247 150 - 440 K/uL  Beta-hydroxybutyric acid     Status: Abnormal   Collection Time: 06/10/15  4:12 AM  Result Value Ref Range   Beta-Hydroxybutyric Acid >8.00 (H) 0.05 - 0.27 mmol/L  Blood gas, venous     Status: Abnormal   Collection Time: 06/10/15  4:20 AM  Result Value Ref Range   pH, Ven 7.01 (LL) 7.320 - 7.430    Comment: CRITICAL RESULT CALLED TO, READ BACK BY AND VERIFIED WITH: CRITICAL RESULTS CALLED TO DR.SUNG AT 0501 ON 06/10/15    pCO2, Ven 24 (L) 44.0 - 60.0 mmHg   Bicarbonate 6.1 (L) 21.0 - 28.0 mEq/L   Acid-base deficit 23.7 (H) 0.0 - 2.0 mmol/L   Patient temperature 37.0    Collection site VENOUS    Sample type VENOUS   Urinalysis complete, with microscopic ([facility] only)     Status: Abnormal   Collection Time: 06/10/15  4:50 AM  Result Value Ref Range   Color, Urine STRAW (A) YELLOW   APPearance CLEAR (A) CLEAR   Glucose, UA >500 (A) NEGATIVE mg/dL   Bilirubin Urine NEGATIVE NEGATIVE   Ketones, ur 2+ (A) NEGATIVE mg/dL   Specific Gravity, Urine 1.026 1.005 - 1.030   Hgb urine dipstick 1+ (A) NEGATIVE   pH 5.0 5.0 - 8.0   Protein, ur 100 (A) NEGATIVE mg/dL   Nitrite NEGATIVE NEGATIVE   Leukocytes, UA NEGATIVE NEGATIVE   RBC / HPF 0-5 0 - 5 RBC/hpf   WBC, UA 0-5 0 - 5 WBC/hpf   Bacteria, UA NONE SEEN NONE SEEN   Squamous Epithelial / LPF NONE SEEN NONE SEEN   Mucous PRESENT    Hyaline Casts, UA PRESENT   Culture, blood (routine x 2)      Status:  None (Preliminary result)   Collection Time: 06/10/15  5:21 AM  Result Value Ref Range   Specimen Description BLOOD LEFT ANTECUBITAL    Special Requests BOTTLES DRAWN AEROBIC AND ANAEROBIC 6CC    Culture NO GROWTH < 12 HOURS    Report Status PENDING   Culture, blood (routine x 2)     Status: None (Preliminary result)   Collection Time: 06/10/15  5:21 AM  Result Value Ref Range   Specimen Description BLOOD RIGHT ANTECUBITAL    Special Requests BOTTLES DRAWN AEROBIC AND ANAEROBIC 6CC    Culture NO GROWTH < 12 HOURS    Report Status PENDING   Glucose, capillary     Status: Abnormal   Collection Time: 06/10/15  6:49 AM  Result Value Ref Range   Glucose-Capillary 336 (H) 65 - 99 mg/dL  Glucose, capillary     Status: Abnormal   Collection Time: 06/10/15  7:49 AM  Result Value Ref Range   Glucose-Capillary 323 (H) 65 - 99 mg/dL  Glucose, capillary     Status: Abnormal   Collection Time: 06/10/15  8:12 AM  Result Value Ref Range   Glucose-Capillary 303 (H) 65 - 99 mg/dL  MRSA PCR Screening     Status: Abnormal   Collection Time: 06/10/15  8:14 AM  Result Value Ref Range   MRSA by PCR POSITIVE (A) NEGATIVE    Comment:        The GeneXpert MRSA Assay (FDA approved for NASAL specimens only), is one component of a comprehensive MRSA colonization surveillance program. It is not intended to diagnose MRSA infection nor to guide or monitor treatment for MRSA infections. CRITICAL RESULT CALLED TO, READ BACK BY AND VERIFIED WITH: ADAM SCARBORO @ 1022 ON 06/10/2015 BY CAF   Basic metabolic panel (Timed Now then every 4 hours)     Status: Abnormal   Collection Time: 06/10/15  8:43 AM  Result Value Ref Range   Sodium 134 (L) 135 - 145 mmol/L   Potassium 4.8 3.5 - 5.1 mmol/L   Chloride 106 101 - 111 mmol/L   CO2 8 (L) 22 - 32 mmol/L   Glucose, Bld 279 (H) 65 - 99 mg/dL   BUN 20 6 - 20 mg/dL   Creatinine, Ser 1.20 0.61 - 1.24 mg/dL   Calcium 8.5 (L) 8.9 - 10.3 mg/dL   GFR  calc non Af Amer >60 >60 mL/min   GFR calc Af Amer >60 >60 mL/min    Comment: (NOTE) The eGFR has been calculated using the CKD EPI equation. This calculation has not been validated in all clinical situations. eGFR's persistently <60 mL/min signify possible Chronic Kidney Disease.    Anion gap 20 (H) 5 - 15  Magnesium     Status: None   Collection Time: 06/10/15  8:43 AM  Result Value Ref Range   Magnesium 2.1 1.7 - 2.4 mg/dL  Glucose, capillary     Status: Abnormal   Collection Time: 06/10/15  9:17 AM  Result Value Ref Range   Glucose-Capillary 237 (H) 65 - 99 mg/dL  Glucose, capillary     Status: Abnormal   Collection Time: 06/10/15 10:10 AM  Result Value Ref Range   Glucose-Capillary 211 (H) 65 - 99 mg/dL  Glucose, capillary     Status: Abnormal   Collection Time: 06/10/15 11:12 AM  Result Value Ref Range   Glucose-Capillary 185 (H) 65 - 99 mg/dL  Glucose, capillary     Status: Abnormal   Collection Time: 06/10/15 12:18  PM  Result Value Ref Range   Glucose-Capillary 180 (H) 65 - 99 mg/dL  Basic metabolic panel (Timed Now then every 4 hours)     Status: Abnormal   Collection Time: 06/10/15 12:44 PM  Result Value Ref Range   Sodium 134 (L) 135 - 145 mmol/L   Potassium 4.2 3.5 - 5.1 mmol/L   Chloride 111 101 - 111 mmol/L   CO2 13 (L) 22 - 32 mmol/L   Glucose, Bld 172 (H) 65 - 99 mg/dL   BUN 17 6 - 20 mg/dL   Creatinine, Ser 0.79 0.61 - 1.24 mg/dL   Calcium 8.5 (L) 8.9 - 10.3 mg/dL   GFR calc non Af Amer >60 >60 mL/min   GFR calc Af Amer >60 >60 mL/min    Comment: (NOTE) The eGFR has been calculated using the CKD EPI equation. This calculation has not been validated in all clinical situations. eGFR's persistently <60 mL/min signify possible Chronic Kidney Disease.    Anion gap 10 5 - 15  Glucose, capillary     Status: Abnormal   Collection Time: 06/10/15  1:22 PM  Result Value Ref Range   Glucose-Capillary 137 (H) 65 - 99 mg/dL  Glucose, capillary     Status:  Abnormal   Collection Time: 06/10/15  2:15 PM  Result Value Ref Range   Glucose-Capillary 154 (H) 65 - 99 mg/dL  Glucose, capillary     Status: Abnormal   Collection Time: 06/10/15  3:08 PM  Result Value Ref Range   Glucose-Capillary 125 (H) 65 - 99 mg/dL   No results found.   Review of Systems  Constitutional: Negative for fever and weight loss.  Respiratory: Negative for cough.   Cardiovascular: Negative for chest pain.  Gastrointestinal: Negative for heartburn and nausea.    Physical Exam  HENT:  Head: Normocephalic.  Eyes: Pupils are equal, round, and reactive to light.  Pulmonary/Chest: Effort normal.  Skin:  2 2 cm by 3 cm kissing axillary abscesses with purulent drainage    Data Reviewed  I have personally reviewed the patient's imaging, laboratory findings and medical records.    Assessment    Left axillary abscesses, drainaing    Plan    Warm compresses for now will re-check in am.  Discussed with RN on staff.       Sherri Rad MD, FACS 06/10/2015, 4:25 PM

## 2015-06-10 NOTE — Progress Notes (Signed)
Pt doing significantly better, awake, alert, BP has normalized, HR now 82.  OK to floor, Critical care will sign off for now.

## 2015-06-10 NOTE — ED Notes (Signed)
Called pharmacy about insulin, pharmacy mixing bag.

## 2015-06-10 NOTE — Progress Notes (Signed)
[place] [facility] [place] NAME: Trevor Brown    MR#:  [id number]  DATE OF BIRTH:  11/29/1990  SUBJECTIVE:  CHIEF COMPLAINT:   Chief Complaint  Patient presents with  . Hyperglycemia   Patient here due to acute diabetic ketoacidosis. Also noted to have left axillary hidradenitis. Remains on insulin drip and anion gap is improving. Positive for nausea, vomiting. Blood sugars are stable. Noted to be mildly hypokalemic and we'll add potassium to his fluids.  REVIEW OF SYSTEMS:    Review of Systems  Constitutional: Negative for fever and chills.  HENT: Negative for congestion and tinnitus.   Eyes: Negative for blurred vision and double vision.  Respiratory: Negative for cough, shortness of breath and wheezing.   Cardiovascular: Negative for chest pain, orthopnea and PND.  Gastrointestinal: Positive for nausea and vomiting. Negative for abdominal pain and diarrhea.  Genitourinary: Negative for dysuria and hematuria.  Neurological: Negative for dizziness, sensory change and focal weakness.  All other systems reviewed and are negative.   Nutrition: NPO Tolerating Diet: No Tolerating PT: Ambulatory   DRUG ALLERGIES:   Allergies  Allergen Reactions  . Bee Venom Anaphylaxis  . Penicillins Anaphylaxis, Hives and Other (See Comments)    Has patient had a PCN reaction causing immediate rash, facial/tongue/throat swelling, SOB or lightheadedness with hypotension: Yes Has patient had a PCN reaction causing severe rash involving mucus membranes or skin necrosis: No Has patient had a PCN reaction that required hospitalization No Has patient had a PCN reaction occurring within the last 10 years: Yes If all of the above answers are "NO", then may proceed with Cephalosporin use.  . Ibuprofen Other (See Comments)    Reaction:  GI upset   . Tramadol Hives and Swelling  . Vancomycin Rash and Other (See Comments)    Reaction:  Red man's syndrome      VITALS:  Blood pressure 131/72, pulse 87, temperature 97.8 F (36.6 C), temperature source Axillary, resp. rate 20, height '5\' 4"'  (1.626 m), weight 46.8 kg (103 lb 2.8 oz), SpO2 100 %.  PHYSICAL EXAMINATION:   Physical Exam  GENERAL:  24 y.o.-year-old patient lying in the bed with no acute distress.  EYES: Pupils equal, round, reactive to light and accommodation. No scleral icterus. Extraocular muscles intact.  HEENT: Head atraumatic, normocephalic. Oropharynx and nasopharynx clear.  NECK:  Supple, no jugular venous distention. No thyroid enlargement, no tenderness.  LUNGS: Normal breath sounds bilaterally, no wheezing, rales, rhonchi. No use of accessory muscles of respiration.  CARDIOVASCULAR: S1, S2 normal. No murmurs, rubs, or gallops.  ABDOMEN: Soft, nontender, nondistended. Bowel sounds present. No organomegaly or mass.  EXTREMITIES: No cyanosis, clubbing or edema b/l.  Left axillary hidradenitis with yellow drainage.   NEUROLOGIC: Cranial nerves II through XII are intact. No focal Motor or sensory deficits b/l.   PSYCHIATRIC: The patient is alert and oriented x 3.  SKIN: No obvious rash, lesion, or ulcer. Left axillary hidradenitis supprativa.   LABORATORY PANEL:   CBC  Recent Labs Lab 06/10/15 0412  WBC 11.4*  HGB 16.7  HCT 51.6  PLT 247   ------------------------------------------------------------------------------------------------------------------  Chemistries   Recent Labs Lab 06/06/15 1606  06/10/15 0843 06/10/15 1244  NA 131*  < > 134* 134*  K 4.5  < > 4.8 4.2  CL 96*  < > 106 111  CO2 12*  < > 8* 13*  GLUCOSE 429*  < > 279* 172*  BUN 16  < >  20 17  CREATININE 1.11  < > 1.20 0.79  CALCIUM 9.5  < > 8.5* 8.5*  MG  --   --  2.1  --   AST 38  --   --   --   ALT 86*  --   --   --   ALKPHOS 181*  --   --   --   BILITOT 1.6*  --   --   --   < > = values in this interval not  displayed. ------------------------------------------------------------------------------------------------------------------  Cardiac Enzymes  Recent Labs Lab 06/06/15 1606  TROPONINI 0.04*   ------------------------------------------------------------------------------------------------------------------  RADIOLOGY:  No results found.   ASSESSMENT AND PLAN:   24 year old male with history of type 1 diabetes, recent admission for DKA, hidradenitis who presents to the hospital with nausea vomiting and noted to be in acute diabetic ketoacidosis.  #1 acute DKA-secondary to patient's noncompliance as patient did not fill his prescriptions upon his recent discharge.  - Continue IV fluids, antiemetics, and insulin drip. Follow serial Met B's until AG closes.  - will check HemoglobinA1c.   #2 Hyponatremia - mild and likely hyperosmolar in nature due to hyperglycemia.  - should improve as sugars correct.   # 3 Elevated Troponin - likely in the setting of demand ischemia from DKA.  - no chest pain.   #4 hidradenitis supprativa - left axillary region. S/p I & D in the ER a few days back.  - continues to have some yellow drainage.  Will get surgical consult.  - cont. Clindamycin.    All the records are reviewed and case discussed with Care Management/Social Workerr. Management plans discussed with the patient, family and they are in agreement.  CODE STATUS: Full  DVT Prophylaxis: Lovenox  TOTAL TIME TAKING CARE OF THIS PATIENT: 30 minutes.   POSSIBLE D/C IN 1-2 DAYS, DEPENDING ON CLINICAL CONDITION.   Henreitta Leber M.D on 06/10/2015 at 2:51 PM  Between 7am to 6pm - Pager - [phone]  After 6pm go to [email or web address] - password [email or web address] [facility]  [place] [facility]  Office  [phone]  CC: Primary care physician; No PCP Per Patient

## 2015-06-10 NOTE — ED Notes (Signed)
Called pharmacy to confirm clindamycin for patient.

## 2015-06-10 NOTE — ED Notes (Signed)
Pt was in [facility] ED 2 days ago for hyperglycemia.  Pt is a type 1 diabetic.  Pt began to feel nauseated, weak, dry mouth, and have heart burn around midnight. Pt states: my chest feels hot.  Pt c.o 10 out of 10 pain in the right lateral abdomen.  Pt states: "It feels like it's where my kidney is".  Pt also reports 10 out of 10 pain in the left axilla. Pt has a reddened, raised area leaking exudate on the left axilla.  Pt reports tooth pain at 10 out of 10.  Pt takes lantus and novolog for his diabetes.  Pt reports last dose of insulin at previous visit to ED 2 days ago.

## 2015-06-11 LAB — GLUCOSE, CAPILLARY
Glucose-Capillary: 100 mg/dL — ABNORMAL HIGH (ref 65–99)
Glucose-Capillary: 108 mg/dL — ABNORMAL HIGH (ref 65–99)
Glucose-Capillary: 238 mg/dL — ABNORMAL HIGH (ref 65–99)
Glucose-Capillary: 271 mg/dL — ABNORMAL HIGH (ref 65–99)
Glucose-Capillary: 309 mg/dL — ABNORMAL HIGH (ref 65–99)
Glucose-Capillary: 351 mg/dL — ABNORMAL HIGH (ref 65–99)
Glucose-Capillary: 99 mg/dL (ref 65–99)

## 2015-06-11 LAB — HEMOGLOBIN A1C: Hgb A1c MFr Bld: 13.1 % — ABNORMAL HIGH (ref 4.0–6.0)

## 2015-06-11 MED ORDER — INSULIN ASPART 100 UNIT/ML ~~LOC~~ SOLN
0.0000 [IU] | Freq: Every day | SUBCUTANEOUS | Status: DC
  Administered 2015-06-12: 5 [IU] via SUBCUTANEOUS
  Filled 2015-06-11: qty 5

## 2015-06-11 MED ORDER — INSULIN ASPART 100 UNIT/ML ~~LOC~~ SOLN
5.0000 [IU] | Freq: Three times a day (TID) | SUBCUTANEOUS | Status: DC
  Administered 2015-06-11 – 2015-06-12 (×2): 5 [IU] via SUBCUTANEOUS
  Filled 2015-06-11 (×2): qty 5

## 2015-06-11 NOTE — Progress Notes (Signed)
[facility] [place] at [facility]   PATIENT NAME: Trevor Brown    MR#:  [id number]  DATE OF BIRTH:  10/23/1990  SUBJECTIVE:   Pt. Here due to acute DKA which has resolved now.  No N/V this a.m. Afebrile.  No complaints.   REVIEW OF SYSTEMS:    Review of Systems  Constitutional: Negative for fever and chills.  HENT: Negative for congestion and tinnitus.   Eyes: Negative for blurred vision and double vision.  Respiratory: Negative for cough, shortness of breath and wheezing.   Cardiovascular: Negative for chest pain, orthopnea and PND.  Gastrointestinal: Negative for nausea, vomiting, abdominal pain and diarrhea.  Genitourinary: Negative for dysuria and hematuria.  Neurological: Negative for dizziness, sensory change and focal weakness.  All other systems reviewed and are negative.   Nutrition: Carb control Tolerating Diet: Yes Tolerating PT: Ambulatory   DRUG ALLERGIES:   Allergies  Allergen Reactions  . Bee Venom Anaphylaxis  . Penicillins Anaphylaxis, Hives and Other (See Comments)    Has patient had a PCN reaction causing immediate rash, facial/tongue/throat swelling, SOB or lightheadedness with hypotension: Yes Has patient had a PCN reaction causing severe rash involving mucus membranes or skin necrosis: No Has patient had a PCN reaction that required hospitalization No Has patient had a PCN reaction occurring within the last 10 years: Yes If all of the above answers are "NO", then may proceed with Cephalosporin use.  . Ibuprofen Other (See Comments)    Reaction:  GI upset   . Tramadol Hives and Swelling  . Vancomycin Rash and Other (See Comments)    Reaction:  Red man's syndrome     VITALS:  Blood pressure 99/79, pulse 54, temperature 98.4 F (36.9 C), temperature source Axillary, resp. rate 17, height 5\' 4"  (1.626 m), weight 46.8 kg (103 lb 2.8 oz), SpO2 100 %.  PHYSICAL EXAMINATION:   Physical Exam  GENERAL:  24 y.o.-year-old  patient lying in the bed with no acute distress.  EYES: Pupils equal, round, reactive to light and accommodation. No scleral icterus. Extraocular muscles intact.  HEENT: Head atraumatic, normocephalic. Oropharynx and nasopharynx clear.  NECK:  Supple, no jugular venous distention. No thyroid enlargement, no tenderness.  LUNGS: Normal breath sounds bilaterally, no wheezing, rales, rhonchi. No use of accessory muscles of respiration.  CARDIOVASCULAR: S1, S2 normal. No murmurs, rubs, or gallops.  ABDOMEN: Soft, nontender, nondistended. Bowel sounds present. No organomegaly or mass.  EXTREMITIES: No cyanosis, clubbing or edema b/l.  Left axillary hidradenitis supprativa.    NEUROLOGIC: Cranial nerves II through XII are intact. No focal Motor or sensory deficits b/l.   PSYCHIATRIC: The patient is alert and oriented x 3.  SKIN: No obvious rash, lesion, or ulcer. Left axillary hidradenitis supprativa.   LABORATORY PANEL:   CBC  Recent Labs Lab 06/10/15 0412  WBC 11.4*  HGB 16.7  HCT 51.6  PLT 247   ------------------------------------------------------------------------------------------------------------------  Chemistries   Recent Labs Lab 06/06/15 1606  06/10/15 0843  06/10/15 2030  NA 131*  < > 134*  < > 133*  K 4.5  < > 4.8  < > 4.4  CL 96*  < > 106  < > 107  CO2 12*  < > 8*  < > 15*  GLUCOSE 429*  < > 279*  < > 165*  BUN 16  < > 20  < > 17  CREATININE 1.11  < > 1.20  < > 0.85  CALCIUM 9.5  < >  8.5*  < > 8.7*  MG  --   --  2.1  --   --   AST 38  --   --   --   --   ALT 86*  --   --   --   --   ALKPHOS 181*  --   --   --   --   BILITOT 1.6*  --   --   --   --   < > = values in this interval not displayed. ------------------------------------------------------------------------------------------------------------------  Cardiac Enzymes  Recent Labs Lab 06/06/15 1606  TROPONINI 0.04*    ------------------------------------------------------------------------------------------------------------------  RADIOLOGY:  No results found.   ASSESSMENT AND PLAN:   24 year old male with history of type 1 diabetes, recent admission for DKA, hidradenitis who presents to the hospital with nausea vomiting and noted to be in acute diabetic ketoacidosis.  #1 acute DKA-secondary to patient's noncompliance as patient did not fill his prescriptions upon his recent discharge.  -Much improved and now resolved. Anion gap closed. Off insulin drip. -No nausea, vomiting.  Cont. Lantus, Novolog with meals & sliding scale insulin. - will check HemoglobinA1c.   #2 Hyponatremia - mild and likely hyperosmolar in nature due to hyperglycemia.  - resolved.    # 3 Elevated Troponin - likely in the setting of demand ischemia from DKA.  - no chest pain.   #4 hidradenitis supprativa - left axillary region. S/p I & D in the ER a few days back.  - appreciate surgical input and cont. Warm compress and no need for further I &D - cont. Clindamycin and will switch to oral Bactrim on discharge.   Likely d/c home tomorrow. Transfer to floor off unit tele.    All the records are reviewed and case discussed with Care Management/Social Workerr. Management plans discussed with the patient, family and they are in agreement.  CODE STATUS: Full  DVT Prophylaxis: Lovenox  TOTAL TIME TAKING CARE OF THIS PATIENT: 25 minutes.   POSSIBLE D/C IN 1-2 DAYS, DEPENDING ON CLINICAL CONDITION.   Houston Siren M.D on 06/11/2015 at 12:21 PM  Between 7am to 6pm - Pager - [phone]  After 6pm go to [email or web address] - password [email or web address] [facility]  [place] [facility]  Office  [phone]  CC: Primary care physician; No PCP Per Patient

## 2015-06-11 NOTE — Discharge Summary (Signed)
[facility] [place] at [facility]   PATIENT NAME: Trevor Brown    MR#:  [id number]  DATE OF BIRTH:  13-Sep-1990  DATE OF ADMISSION:  06/06/2015 ADMITTING PHYSICIAN: Trevor Finner, MD  DATE OF DISCHARGE: 06/07/2015 11:49 AM  PRIMARY CARE PHYSICIAN: No PCP Per Patient    ADMISSION DIAGNOSIS:  Abscess [L02.91] Diabetic ketoacidosis without coma associated with other specified diabetes mellitus (HCC) [E13.10]  DISCHARGE DIAGNOSIS:  Active Problems:   DKA, type 1 (HCC)   SECONDARY DIAGNOSIS:   Past Medical History  Diagnosis Date  . Diabetes mellitus without complication (HCC)   . Hepatitis C, acute may 2016  . Heart attack (HCC)     Pt claims he had heart attack a year ago ( 2015)- and was admitted in [facility] for that, but not given any meds or angiogram, on review of chart- I could not find any details like that.    HOSPITAL COURSE:  This is a 24 year old Caucasian male admitted for DKA. Please see Dr Trevor Brown's dictated H & P for further details. He was started on Insulin drip and was converted over to SQ insulin as Anion gap closed. His recurrent DKA was thought to be due to Medication noncompliance.  He was found to have Hidradenitis suppurativa for which He underwent I & D in ED for same and was placed on IV Clindamycin and was improving.   Care management had him set up for medication management clinic for his free medications but biggest issue is his noncompliance in which either he wouldn't pick up his meds (for one or other reasons) or not take them as prescribed and will likely soon end up back in the Hospital with DKA.  He was feeling much better and was D/C home in stable condition and was agreeable with D/C plans. Please note he remains at high risk for readmissions due to his poor social situation and medical noncompliance.  He lives with his father Trevor Brown [phone]) and Gets medications at [facility]. Works as a Trevor Brown. Doesn't drive. Gets a ride to work. Information on near by Dental linics that take Medicaid was also given to him by Trevor Brown as he will need evaluation of his tooth abscess and decays. DISCHARGE CONDITIONS:   Stable.  CONSULTS OBTAINED:     DRUG ALLERGIES:   Allergies  Allergen Reactions  . Bee Venom Anaphylaxis  . Penicillins Anaphylaxis, Hives and Other (See Comments)    Has patient had a PCN reaction causing immediate rash, facial/tongue/throat swelling, SOB or lightheadedness with hypotension: Yes Has patient had a PCN reaction causing severe rash involving mucus membranes or skin necrosis: No Has patient had a PCN reaction that required hospitalization No Has patient had a PCN reaction occurring within the last 10 years: Yes If all of the above answers are "NO", then may proceed with Cephalosporin use.  . Ibuprofen Other (See Comments)    Reaction:  GI upset   . Tramadol Hives and Swelling  . Vancomycin Rash and Other (See Comments)    Reaction:  Red man's syndrome     DISCHARGE MEDICATIONS:   Discharge Medication List as of 06/07/2015 11:11 AM    START taking these medications   Details  oxyCODONE (OXY IR/ROXICODONE) 5 MG immediate release tablet Take 1 tablet (5 mg total) by mouth 2 (two) times daily as needed for moderate pain., Starting 06/07/2015, Until Discontinued, Print      CONTINUE these medications which have NOT  CHANGED   Details  clindamycin (CLEOCIN) 150 MG capsule Take 3 capsules (450 mg total) by mouth 3 (three) times daily., Starting 06/05/2015, Until Thu 06/15/15, Print    insulin aspart (NOVOLOG) 100 UNIT/ML injection Inject 5 Units into the skin 3 (three) times daily with meals., Starting 06/05/2015, Until Wed 07/05/15, Print    insulin glargine (LANTUS) 100 UNIT/ML injection Inject 0.2 mLs (20 Units total) into the skin at bedtime., Starting 06/05/2015, Until Wed 07/05/15, Print      STOP taking these medications     diphenhydrAMINE  (BENADRYL) 25 mg capsule      oxyCODONE-acetaminophen (ROXICET) 5-325 MG per tablet        DISCHARGE INSTRUCTIONS:   DIET:  Diabetic diet  DISCHARGE CONDITION:  Good  ACTIVITY:  Activity as tolerated  OXYGEN:  Home Oxygen: No.   Oxygen Delivery: room air  DISCHARGE LOCATION:  home   If you experience worsening of your admission symptoms, develop shortness of breath, life threatening emergency, suicidal or homicidal thoughts you must seek medical attention immediately by calling 911 or calling your MD immediately  if symptoms less severe.  You Must read complete instructions/literature along with all the possible adverse reactions/side effects for all the Medicines you take and that have been prescribed to you. Take any new Medicines after you have completely understood and accpet all the possible adverse reactions/side effects.   Please note  You were cared for by a hospitalist during your hospital stay. If you have any questions about your discharge medications or the care you received while you were in the hospital after you are discharged, you can call the unit and asked to speak with the hospitalist on call if the hospitalist that took care of you is not available. Once you are discharged, your primary care physician will handle any further medical issues. Please note that NO REFILLS for any discharge medications will be authorized once you are discharged, as it is imperative that you return to your primary care physician (or establish a relationship with a primary care physician if you do not have one) for your aftercare needs so that they can reassess your need for medications and monitor your lab values.    On the day of Discharge:  VITAL SIGNS:  Blood pressure 134/76, pulse 60, temperature 98.7 F (37.1 C), temperature source Oral, resp. rate 20, height 5\' 4"  (1.626 m), weight 50.531 kg (111 lb 6.4 oz), SpO2 98 %. PHYSICAL EXAMINATION:  GENERAL:  24 y.o.-year-old  patient lying in the bed with no acute distress.  EYES: Pupils equal, round, reactive to light and accommodation. No scleral icterus. Extraocular muscles intact.  HEENT: Head atraumatic, normocephalic. Oropharynx and nasopharynx clear.  NECK:  Supple, no jugular venous distention. No thyroid enlargement, no tenderness.  LUNGS: Normal breath sounds bilaterally, no wheezing, rales,rhonchi or crepitation. No use of accessory muscles of respiration.  CARDIOVASCULAR: S1, S2 normal. No murmurs, rubs, or gallops.  ABDOMEN: Soft, non-tender, non-distended. Bowel sounds present. No organomegaly or mass.  EXTREMITIES: No pedal edema, cyanosis, or clubbing.  NEUROLOGIC: Cranial nerves II through XII are intact. Muscle strength 5/5 in all extremities. Sensation intact. Gait not checked.  PSYCHIATRIC: The patient is alert and oriented x 3.  SKIN: No obvious rash, lesion, or ulcer.  DATA REVIEW:   CBC  Recent Labs Lab 06/10/15 0412  WBC 11.4*  HGB 16.7  HCT 51.6  PLT 247    Chemistries   Recent Labs Lab 06/06/15 1606  06/10/15 0843  06/10/15 2030  NA 131*  < > 134*  < > 133*  K 4.5  < > 4.8  < > 4.4  CL 96*  < > 106  < > 107  CO2 12*  < > 8*  < > 15*  GLUCOSE 429*  < > 279*  < > 165*  BUN 16  < > 20  < > 17  CREATININE 1.11  < > 1.20  < > 0.85  CALCIUM 9.5  < > 8.5*  < > 8.7*  MG  --   --  2.1  --   --   AST 38  --   --   --   --   ALT 86*  --   --   --   --   ALKPHOS 181*  --   --   --   --   BILITOT 1.6*  --   --   --   --   < > = values in this interval not displayed.  Cardiac Enzymes  Recent Labs Lab 06/06/15 1606  TROPONINI 0.04*    Microbiology Results  Results for orders placed or performed during the hospital encounter of 04/07/15  MRSA PCR Screening     Status: Abnormal   Collection Time: 04/07/15  1:10 PM  Result Value Ref Range Status   MRSA by PCR POSITIVE (A) NEGATIVE Final    Comment: CRITICAL RESULT CALLED TO, READ BACK BY AND VERIFIED WITH:  PAMELA ALLEN  AT 1635 04/07/15 SDR     Please note he remains at high risk for readmissions due to his poor social situation and medical noncompliance.  Management plans discussed with the patient, family and they are in agreement.  CODE STATUS: Full Code  TOTAL TIME TAKING CARE OF THIS PATIENT: 55 minutes.    [facility], Cadon Raczka M.D on 06/11/2015 at 7:05 AM  Between 7am to 6pm - Pager - [phone]  After 6pm go to [email or web address] - password [email or web address] [facility]  [place] [facility]  Office  [phone]  CC: Primary care physician; [facility]

## 2015-06-11 NOTE — Progress Notes (Signed)
Received pt to room 152.  Complaining of pain of the left arm pit.  Told pt the pain meds was given at 3pm so 7pm will the next time he can have something for pain.  Visitor in the room with the pt.  Head to toe assessment done.   HOB elevated for comfort. Pt alert and oriented x4, no complaints of pain or discomfort.  Bed in low position, call bell within reach.  Bed alarms on and functioning.  Assessment done and charted.  Will continue to monitor and do hourly rounding throughout the shift

## 2015-06-11 NOTE — Progress Notes (Signed)
Patient ID: Trevor Brown, male   DOB: 20-May-1991, 24 y.o.   MRN: [id number]   Feels better  Filed Vitals:   06/11/15 0400 06/11/15 0500 06/11/15 0600 06/11/15 0700  BP: 122/112 126/70 118/74 117/74  Pulse: 58 74 56 53  Temp: 98.4 F (36.9 C)     TempSrc:      Resp: 17 21 17 18   Height:      Weight:      SpO2: 99% 100% 100% 100%    BS under better control.  Exam shows draining left axillary abscesses  IMP:  DKA Left axillary abscesses  Plan:  Continue warm compresses, dry dressing no surgical plans.

## 2015-06-11 NOTE — Progress Notes (Signed)
Dr. Clint GuyHower notified about pt. Not having bedtime coverage. He's putting in new order.

## 2015-06-11 NOTE — Progress Notes (Signed)
Initial Nutrition Assessment    INTERVENTION:   Meals and Snacks: Cater to patient preferences Medical Food Supplement Therapy: recommend addition of Sugar Free Carnation Instant Breakfast TID with meals  NUTRITION DIAGNOSIS:   Inadequate oral intake related to nausea, vomiting, poor appetite, chronic illness as evidenced by per patient/family report, percent weight loss.  GOAL:   Patient will meet greater than or equal to 90% of their needs  MONITOR:    (Energy Intake, Anthropometrics, Electrolyte/Renal Profile, Glucose Profile, Digestive System)  REASON FOR ASSESSMENT:   Malnutrition Screening Tool    ASSESSMENT:    Pt admitted with acute DKA, left axillary hidraenitis   Past Medical History  Diagnosis Date  . Diabetes mellitus without complication (HCC)   . Hepatitis C, acute may 2016  . Heart attack (HCC)     Pt claims he had heart attack a year ago ( 2015)- and [facility], but not given any meds or angiogram, on review of chart- I could not find any details like that.     Diet Order:  Diet heart healthy/carb modified Room service appropriate?: Yes; Fluid consistency:: Thin   Energy Intake: no po intake as yet, diet just advanced during the night  Electrolyte and Renal Profile:  Recent Labs Lab 06/10/15 0843 06/10/15 1244 06/10/15 1633 06/10/15 2030  BUN 20 17 17 17   CREATININE 1.20 0.79 0.82 0.85  NA 134* 134* 134* 133*  K 4.8 4.2 4.3 4.4  MG 2.1  --   --   --    Glucose Profile:   Recent Labs  06/10/15 2209 06/10/15 2257 06/11/15 0716  GLUCAP 193* 201* 238*   Meds: ss novolog  Nutrition Focused Physical Exam:  Unable to complete Nutrition-Focused physical exam at this time.    Height:   Ht Readings from Last 1 Encounters:  06/10/15 5\' 4"  (1.626 m)    Weight: weight trend as per weight encounters; unsure of accuracy of trend as several of the weights appear to be stated or estimated. Based on wt in September; 4.6%  wt loss in 2 months. Regardless pt is underweight  Wt Readings from Last 1 Encounters:  06/10/15 103 lb 2.8 oz (46.8 kg)    Filed Weights   06/10/15 0406 06/10/15 0812  Weight: 120 lb (54.432 kg) 103 lb 2.8 oz (46.8 kg)   Wt Readings from Last 10 Encounters:  06/10/15 103 lb 2.8 oz (46.8 kg)  06/07/15 111 lb 6.4 oz (50.531 kg)  06/05/15 130 lb (58.968 kg)  04/07/15 108 lb 11 oz (49.3 kg)  03/13/15 120 lb (54.432 kg)  02/07/15 120 lb (54.432 kg)  12/09/14 130 lb (58.968 kg)  06/25/14 120 lb (54.432 kg)    BMI:  Body mass index is 17.7 kg/(m^2).  Estimated Nutritional Needs:   Kcal:  [id number] kcals (BEE 1497, 1.3 AF, 1.0-1.2 IF) using IBW 59 kg  Protein:  59-71 g (1.0-1.2 g/kg)   Fluid:  1770-2065 mL (30-35 ml/kg)   MODERATE Care Level  Romelle Starcherate Alexiss Iturralde MS, RD, LDN [phone] Pager

## 2015-06-12 LAB — GLUCOSE, CAPILLARY
Glucose-Capillary: 197 mg/dL — ABNORMAL HIGH (ref 65–99)
Glucose-Capillary: 310 mg/dL — ABNORMAL HIGH (ref 65–99)

## 2015-06-12 MED ORDER — SULFAMETHOXAZOLE-TRIMETHOPRIM 800-160 MG PO TABS: 1.0000 | ORAL_TABLET | Freq: Two times a day (BID) | ORAL | Status: DC

## 2015-06-12 MED ORDER — INSULIN GLARGINE 100 UNIT/ML ~~LOC~~ SOLN
20.0000 [IU] | Freq: Once | SUBCUTANEOUS | Status: AC
  Administered 2015-06-12: 20 [IU] via SUBCUTANEOUS
  Filled 2015-06-12: qty 0.2

## 2015-06-12 MED ORDER — INSULIN ASPART 100 UNIT/ML ~~LOC~~ SOLN: 5.0000 [IU] | Freq: Three times a day (TID) | SUBCUTANEOUS | Status: DC

## 2015-06-12 MED ORDER — INSULIN GLARGINE 100 UNIT/ML ~~LOC~~ SOLN: 20.0000 [IU] | Freq: Every day | SUBCUTANEOUS | Status: DC

## 2015-06-12 NOTE — Progress Notes (Addendum)
Dr. Cherlynn KaiserSainani ordered discharge home. A clean gauze dressing was applied to left axilla and kerlix wrap was placed to hold it in place, Discharge instructions, prescriptions and instructions for follow up appointment was reviewed with patient by RN and Care Manager discussed instructions for follow up appointments and getting prescriptions filled as well. Patient stated that his ride was waiting at the visitors entrance.  IVs were removed and he was discharged.

## 2015-06-12 NOTE — Discharge Instructions (Signed)
Please follow all instructions given to you by Care Manager Marylene Landngela (number is on her card).  It is important to contact the [facility] for a follow up appointment.  It is very important to check your blood sugars and administer your insulin as suggested.  Please do no skip meals and follow the diabetic diet.  Keep the area under your left arm clean and dry. Contact doctor's office if there is any increased redness, swelling, drainage or bleeding.

## 2015-06-12 NOTE — Discharge Summary (Signed)
[facility] -  at [facility]   PATIENT NAME: Trevor Brown    MR#:  [id number]  DATE OF BIRTH:  08-14-1990  DATE OF ADMISSION:  06/10/2015 ADMITTING PHYSICIAN: Arnaldo NatalMichael S Diamond, MD  DATE OF DISCHARGE: 06/12/2015 11:41 AM  PRIMARY CARE PHYSICIAN: No PCP Per Patient    ADMISSION DIAGNOSIS:  History of nonadherence to medical treatment [Z91.19] Hidradenitis suppurativa of left axilla [L73.2] Type 1 diabetes mellitus with ketoacidosis without coma (HCC) [E10.10]  DISCHARGE DIAGNOSIS:  Active Problems:   DKA (diabetic ketoacidoses) (HCC)   Hidradenitis suppurativa of left axilla   SECONDARY DIAGNOSIS:   Past Medical History  Diagnosis Date  . Diabetes mellitus without complication (HCC)   . Hepatitis C, acute may 2016  . Heart attack (HCC)     Pt claims he had heart attack a year ago ( 2015)- and was [facility], but not given any meds or angiogram, on review of chart- I could not find any details like that.    HOSPITAL COURSE:   24 year old male with history of type 1 diabetes, recent admission for DKA, hidradenitis who presents to the hospital with nausea vomiting and noted to be in acute diabetic ketoacidosis.  #1 acute DKA-secondary to patient's noncompliance as patient did not fill his prescriptions upon his recent discharge. -Patient was admitted to the hospital started on IV fluids, insulin drip. After getting aggressive therapy patient's blood sugars improved, his anion gap closed. He clinically improved with no further nausea vomiting. -He was switched over from insulin drip to Lantus and NovoLog with meals his blood sugars remained stable and is being discharged home with close follow-up as an outpatient.  -Patient was also given a prescription for glucometer test strips and lancets.  #2 Hyponatremia - mild and likely hyperosmolar in nature due to hyperglycemia.  - this resolved w/ IV fluid hydration and as sugars  corrected.    # 3 Elevated Troponin - this was likely in the setting of demand ischemia from DKA.  - pt. Had no chest pain while in the hospital.   #4 hidradenitis supprativa - patient was seen by general surgery. They recommended warm compresses. They did not think the patient needed acute surgical intervention presently. Patient was discharged on oral Bactrim for another 10 days.  DISCHARGE CONDITIONS:   Stable  CONSULTS OBTAINED:     DRUG ALLERGIES:   Allergies  Allergen Reactions  . Bee Venom Anaphylaxis  . Penicillins Anaphylaxis, Hives and Other (See Comments)    Has patient had a PCN reaction causing immediate rash, facial/tongue/throat swelling, SOB or lightheadedness with hypotension: Yes Has patient had a PCN reaction causing severe rash involving mucus membranes or skin necrosis: No Has patient had a PCN reaction that required hospitalization No Has patient had a PCN reaction occurring within the last 10 years: Yes If all of the above answers are "NO", then may proceed with Cephalosporin use.  . Ibuprofen Other (See Comments)    Reaction:  GI upset   . Tramadol Hives and Swelling  . Vancomycin Rash and Other (See Comments)    Reaction:  Red man's syndrome     DISCHARGE MEDICATIONS:   Discharge Medication List as of 06/12/2015 11:04 AM    START taking these medications   Details  sulfamethoxazole-trimethoprim (BACTRIM DS,SEPTRA DS) 800-160 MG tablet Take 1 tablet by mouth 2 (two) times daily., Starting 06/12/2015, Until Discontinued, Print      CONTINUE these medications which have CHANGED  Details  insulin aspart (NOVOLOG) 100 UNIT/ML injection Inject 5 Units into the skin 3 (three) times daily with meals., Starting 06/12/2015, Until Wed 07/12/15, Print    insulin glargine (LANTUS) 100 UNIT/ML injection Inject 0.2 mLs (20 Units total) into the skin at bedtime., Starting 06/12/2015, Until Wed 07/12/15, Print      STOP taking these medications      clindamycin (CLEOCIN) 150 MG capsule      oxyCODONE (OXY IR/ROXICODONE) 5 MG immediate release tablet          DISCHARGE INSTRUCTIONS:   DIET:  Diabetic diet  DISCHARGE CONDITION:  Good  ACTIVITY:  Activity as tolerated  OXYGEN:  Home Oxygen: No.   Oxygen Delivery: room air  DISCHARGE LOCATION:  home   If you experience worsening of your admission symptoms, develop shortness of breath, life threatening emergency, suicidal or homicidal thoughts you must seek medical attention immediately by calling 911 or calling your MD immediately  if symptoms less severe.  You Must read complete instructions/literature along with all the possible adverse reactions/side effects for all the Medicines you take and that have been prescribed to you. Take any new Medicines after you have completely understood and accpet all the possible adverse reactions/side effects.   Please note  You were cared for by a hospitalist during your hospital stay. If you have any questions about your discharge medications or the care you received while you were in the hospital after you are discharged, you can call the unit and asked to speak with the hospitalist on call if the hospitalist that took care of you is not available. Once you are discharged, your primary care physician will handle any further medical issues. Please note that NO REFILLS for any discharge medications will be authorized once you are discharged, as it is imperative that you return to your primary care physician (or establish a relationship with a primary care physician if you do not have one) for your aftercare needs so that they can reassess your need for medications and monitor your lab values.     Today   Blood sugar stable, no nausea, vomiting. Still having some pain in the left axillary region.  VITAL SIGNS:  Blood pressure 118/58, pulse 54, temperature 98.3 F (36.8 C), temperature source Oral, resp. rate 16, height  (1.626 m),  weight 46.8 kg (103 lb 2.8 oz), SpO2 100 %.  I/O:   Intake/Output Summary (Last 24 hours) at 06/12/15 1456 Last data filed at 06/12/15 0740  Gross per 24 hour  Intake      0 ml  Output      0 ml  Net      0 ml    PHYSICAL EXAMINATION:   GENERAL: 24 y.o.-year-old patient lying in the bed with no acute distress.  EYES: Pupils equal, round, reactive to light and accommodation. No scleral icterus. Extraocular muscles intact.  HEENT: Head atraumatic, normocephalic. Oropharynx and nasopharynx clear.  NECK: Supple, no jugular venous distention. No thyroid enlargement, no tenderness.  LUNGS: Normal breath sounds bilaterally, no wheezing, rales, rhonchi. No use of accessory muscles of respiration.  CARDIOVASCULAR: S1, S2 normal. No murmurs, rubs, or gallops.  ABDOMEN: Soft, nontender, nondistended. Bowel sounds present. No organomegaly or mass.  EXTREMITIES: No cyanosis, clubbing or edema b/l. Left axillary hidradenitis supprativa.  NEUROLOGIC: Cranial nerves II through XII are intact. No focal Motor or sensory deficits b/l.  PSYCHIATRIC: The patient is alert and oriented x 3.  SKIN: No obvious rash, lesion,  or ulcer. Left axillary hidradenitis supprativa.   DATA REVIEW:   CBC  Recent Labs Lab 06/10/15 0412  WBC 11.4*  HGB 16.7  HCT 51.6  PLT 247    Chemistries   Recent Labs Lab 06/06/15 1606  06/10/15 0843  06/10/15 2030  NA 131*  < > 134*  < > 133*  K 4.5  < > 4.8  < > 4.4  CL 96*  < > 106  < > 107  CO2 12*  < > 8*  < > 15*  GLUCOSE 429*  < > 279*  < > 165*  BUN 16  < > 20  < > 17  CREATININE 1.11  < > 1.20  < > 0.85  CALCIUM 9.5  < > 8.5*  < > 8.7*  MG  --   --  2.1  --   --   AST 38  --   --   --   --   ALT 86*  --   --   --   --   ALKPHOS 181*  --   --   --   --   BILITOT 1.6*  --   --   --   --   < > = values in this interval not displayed.  Cardiac Enzymes  Recent Labs Lab 06/06/15 1606  TROPONINI 0.04*    Microbiology Results   Results for orders placed or performed during the hospital encounter of 06/10/15  Culture, blood (routine x 2)     Status: None (Preliminary result)   Collection Time: 06/10/15  5:21 AM  Result Value Ref Range Status   Specimen Description BLOOD LEFT ANTECUBITAL  Final   Special Requests BOTTLES DRAWN AEROBIC AND ANAEROBIC 6CC  Final   Culture NO GROWTH 1 DAY  Final   Report Status PENDING  Incomplete  Culture, blood (routine x 2)     Status: None (Preliminary result)   Collection Time: 06/10/15  5:21 AM  Result Value Ref Range Status   Specimen Description BLOOD RIGHT ANTECUBITAL  Final   Special Requests BOTTLES DRAWN AEROBIC AND ANAEROBIC 6CC  Final   Culture NO GROWTH 1 DAY  Final   Report Status PENDING  Incomplete  MRSA PCR Screening     Status: Abnormal   Collection Time: 06/10/15  8:14 AM  Result Value Ref Range Status   MRSA by PCR POSITIVE (A) NEGATIVE Final    Comment:        The GeneXpert MRSA Assay (FDA approved for NASAL specimens only), is one component of a comprehensive MRSA colonization surveillance program. It is not intended to diagnose MRSA infection nor to guide or monitor treatment for MRSA infections. CRITICAL RESULT CALLED TO, READ BACK BY AND VERIFIED WITH: ADAM SCARBORO @ 1022 ON 06/10/2015 BY [facility]     RADIOLOGY:  No results found.    Management plans discussed with the patient, family and they are in agreement.  CODE STATUS:   TOTAL TIME TAKING CARE OF THIS PATIENT: 40 minutes.    Houston Siren M.D on 06/12/2015 at 2:56 PM  Between 7am to 6pm - Pager - [phone]  After 6pm go to [email or web address] - password [email or web address] [facility]  [facility]  Office  [phone]  CC: Primary care physician; No PCP Per Patient

## 2015-06-12 NOTE — Progress Notes (Addendum)
Inpatient Diabetes Program Recommendations  AACE/ADA: New Consensus Statement on Inpatient Glycemic Control (2015)  Target Ranges:  Prepandial:   less than 140 mg/dL      Peak postprandial:   less than 180 mg/dL (1-2 hours)      Critically ill patients:  140 - 180 mg/dL   Review of Glycemic Control:  Results for Trevor Brown, Cadden B (MRN [id number]) as of 06/12/2015 09:33  Ref. Range 06/11/2015 07:16 06/11/2015 12:55 06/11/2015 16:49 06/11/2015 21:12 06/11/2015 23:33 06/12/2015 07:41  Glucose-Capillary Latest Ref Range: 65-99 mg/dL 409238 (H) 811197 (H) 914271 (H) 309 (H) 351 (H) 310 (H)   Diabetes history: Type 1 diabetes Outpatient Diabetes medications: Lantus 20 units daily, Novolog 5 units tid with meals Current orders for Inpatient glycemic control:  Novolog moderate tid with meals and Novolog 5 units tid with meals.  Inpatient Diabetes Program Recommendations:   Note that fasting CBG increased this morning.  Based on orders and documentation, patient did not receive Lantus on 11/20. Text page sent to Dr. Cherlynn KaiserSainani requesting Lantus and BMP (if appropriate).  Based on documentation, patient had a difficult time obtaining insulin.  It appears that he [facility] however there was confusion related to his Medicaid eligibility.  Evidently he does have "family planning" medicaid but no Rx. Coverage. Will request case manager to see patient.  Thanks, Beryl MeagerJenny Gunner Iodice, RN, BC-ADM Inpatient Diabetes Coordinator Pager [phone](8a-5p)  0950- Spoke with case manager.  She has seen patient and has done an outstanding job of helping him with resources.  Order placed by MD for Lantus.

## 2015-06-12 NOTE — Progress Notes (Signed)
Spoke with patient after case management.  Attempted to reinforce information from case manager.  According to patient he was diagnosed with diabetes 5 years ago and has never had a primary dr. To manage his diabetes.  He states that his blood sugars usually run in the 500's.  He has meter at bedside which was given by case Production designer, theatre/television/filmmanager.  Instructed him also on [employer] meter due to lower cost.  He appeared angry and I attempted to validate his feelings regarding the difficulty of having Type 1 diabetes.  He appears very thin and discussed importance of insulin so that cells do not starve. He states he does take 4 shots daily as ordered when he has the insulin. 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  He would greatly benefit from outpatient diabetes education and follow-up however currently has no insurance. He state he has no further questions.  Told him that RN would give him his Lantus prior to him being d/c'd.  Thanks, Beryl MeagerJenny Sherisa Gilvin, RN, BC-ADM Inpatient Diabetes Coordinator Pager [phone] (8a-5p)

## 2015-06-12 NOTE — Care Management (Signed)
Dx of diabetes at 24 y/o. Patient is well know for uncontrolled diabetes and ?noncompliance with meds. I spoke with him at length about following through with his responsibilities related to Rx and PCP. I found that [facility] Card but he has NO RX and PCP coverage- it is family coverage only but he does not have children. His mother used to take care of all his medical needs but she has passed away. He has a father named Chanetta MarshallJimmy [phone]) and I have left message for him to call this RNCM to discuss transportation and follow up appointments. Patient appears clean. He has name brand clothes and shoes that appear new although those could have been given to him. He has a cell phone (listed on demographics) but states "the speakers are messed up and his father's number would be best to reach him". He does not indicate any difficulty with transportation. Medication mgt states that he had an appointment with them on 04/18/15 but missed it. They said "he has to get a PCP in order for them to keep assisting him". They has agreed to helping him this time. Case mgt has given him a glucometer- RN to instruct on use. I delivered his Rx for a new glucometer and his Rx have been faxed to med mgt. I have sent email to [facility] Shelle IronSylvia Andrews to assist patient with Medical coverage/Medicaid. Patient advised to follow up with [facility], [facility], and [facility]- he agrees. I gave him my contact card to call with any outpatient questions. He was quiet and seemed upset during our conversation but denies any further questions. States "his arm pit hurts". I asked RN to assess prior to discharge. No further RNCM needs.

## 2015-06-15 LAB — CULTURE, BLOOD (ROUTINE X 2)
Culture: NO GROWTH
Culture: NO GROWTH

## 2015-09-06 ENCOUNTER — Emergency Department
Admission: EM | Admit: 2015-09-06 | Discharge: 2015-09-06 | Disposition: A | Discharge status: Home / Self Care | Payer: Medicaid Other | Attending: Emergency Medicine | Admitting: Emergency Medicine

## 2015-09-06 ENCOUNTER — Telehealth: Payer: Self-pay | Admitting: Emergency Medicine

## 2015-09-06 ENCOUNTER — Encounter: Payer: Self-pay | Admitting: *Deleted

## 2015-09-06 DIAGNOSIS — R739 Hyperglycemia, unspecified: Secondary | ICD-10-CM

## 2015-09-06 DIAGNOSIS — Z880 Allergy status to penicillin: Secondary | ICD-10-CM | POA: Insufficient documentation

## 2015-09-06 DIAGNOSIS — F17210 Nicotine dependence, cigarettes, uncomplicated: Secondary | ICD-10-CM | POA: Insufficient documentation

## 2015-09-06 DIAGNOSIS — R112 Nausea with vomiting, unspecified: Secondary | ICD-10-CM | POA: Insufficient documentation

## 2015-09-06 DIAGNOSIS — E1065 Type 1 diabetes mellitus with hyperglycemia: Secondary | ICD-10-CM | POA: Insufficient documentation

## 2015-09-06 DIAGNOSIS — E1010 Type 1 diabetes mellitus with ketoacidosis without coma: Secondary | ICD-10-CM | POA: Insufficient documentation

## 2015-09-06 LAB — CBC
HCT: 49.5 % (ref 40.0–52.0)
Hemoglobin: 16.8 g/dL (ref 13.0–18.0)
MCH: 29.6 pg (ref 26.0–34.0)
MCHC: 34 g/dL (ref 32.0–36.0)
MCV: 87 fL (ref 80.0–100.0)
Platelets: 319 10*3/uL (ref 150–440)
RBC: 5.69 MIL/uL (ref 4.40–5.90)
RDW: 12.7 % (ref 11.5–14.5)
WBC: 13.8 10*3/uL — ABNORMAL HIGH (ref 3.8–10.6)

## 2015-09-06 LAB — URINALYSIS COMPLETE WITH MICROSCOPIC (ARMC ONLY)
Bacteria, UA: NONE SEEN
Bilirubin Urine: NEGATIVE
Glucose, UA: >500 mg/dL — AB
Hgb urine dipstick: NEGATIVE
Leukocytes, UA: NEGATIVE
Nitrite: NEGATIVE
Protein, ur: NEGATIVE mg/dL
Specific Gravity, Urine: 1.032 — ABNORMAL HIGH (ref 1.005–1.030)
Squamous Epithelial / LPF: NONE SEEN
pH: 5 (ref 5.0–8.0)

## 2015-09-06 LAB — COMPREHENSIVE METABOLIC PANEL
ALT: 60 U/L (ref 17–63)
AST: 77 U/L — ABNORMAL HIGH (ref 15–41)
Albumin: 4.8 g/dL (ref 3.5–5.0)
Alkaline Phosphatase: 167 U/L — ABNORMAL HIGH (ref 38–126)
Anion gap: 16 — ABNORMAL HIGH (ref 5–15)
BUN: 26 mg/dL — ABNORMAL HIGH (ref 6–20)
CO2: 24 mmol/L (ref 22–32)
Calcium: 10.2 mg/dL (ref 8.9–10.3)
Chloride: 95 mmol/L — ABNORMAL LOW (ref 101–111)
Creatinine, Ser: 0.91 mg/dL (ref 0.61–1.24)
GFR calc Af Amer: >60 mL/min (ref >60)
GFR calc non Af Amer: >60 mL/min (ref >60)
Glucose, Bld: 285 mg/dL — ABNORMAL HIGH (ref 65–99)
Potassium: 3.6 mmol/L (ref 3.5–5.1)
Sodium: 135 mmol/L (ref 135–145)
Total Bilirubin: 1.1 mg/dL (ref 0.3–1.2)
Total Protein: 8.9 g/dL — ABNORMAL HIGH (ref 6.5–8.1)

## 2015-09-06 LAB — GLUCOSE, CAPILLARY
Glucose-Capillary: 304 mg/dL — ABNORMAL HIGH (ref 65–99)
Glucose-Capillary: 168 mg/dL — ABNORMAL HIGH (ref 65–99)

## 2015-09-06 LAB — LIPASE, BLOOD: Lipase: 15 U/L (ref 11–51)

## 2015-09-06 MED ORDER — INSULIN GLARGINE 100 UNIT/ML ~~LOC~~ SOLN: 20.0000 [IU] | Freq: Every day | SUBCUTANEOUS | Status: DC

## 2015-09-06 MED ORDER — INSULIN ASPART 100 UNIT/ML ~~LOC~~ SOLN: SUBCUTANEOUS | Status: DC

## 2015-09-06 MED ORDER — ONDANSETRON HCL 4 MG/2ML IJ SOLN
4.0000 mg | Freq: Once | INTRAMUSCULAR | Status: AC | PRN
  Administered 2015-09-06: 4 mg via INTRAVENOUS

## 2015-09-06 MED ORDER — PROMETHAZINE HCL 25 MG/ML IJ SOLN: 25.0000 mg | Freq: Once | INTRAMUSCULAR | Status: DC

## 2015-09-06 MED ORDER — ONDANSETRON HCL 4 MG/2ML IJ SOLN
INTRAMUSCULAR | Status: AC
  Filled 2015-09-06: qty 2

## 2015-09-06 MED ORDER — PROMETHAZINE HCL 25 MG PO TABS
ORAL_TABLET | ORAL | Status: AC
Start: 2015-09-06 — End: 2015-09-06
  Administered 2015-09-06: 25 mg
  Filled 2015-09-06: qty 1

## 2015-09-06 MED ORDER — PROMETHAZINE HCL 25 MG PO TABS: 25.0000 mg | ORAL_TABLET | Freq: Four times a day (QID) | ORAL | Status: DC | PRN

## 2015-09-06 NOTE — ED Notes (Signed)
States he woke up this AM with nausea and vomtiitng, hx of DM 1 states his CBG was 525, states he took 30 units of Novolog at 0400, normally takes 5 untis before each meal, state she normally takes lantus 20 units but has been out of it for 2 days

## 2015-09-06 NOTE — Discharge Instructions (Signed)
Hyperglycemia °Hyperglycemia occurs when the glucose (sugar) in your blood is too high. Hyperglycemia can happen for many reasons, but it most often happens to people who do not know they have diabetes or are not managing their diabetes properly.  °CAUSES  °Whether you have diabetes or not, there are other causes of hyperglycemia. Hyperglycemia can occur when you have diabetes, but it can also occur in other situations that you might not be as aware of, such as: °Diabetes °· If you have diabetes and are having problems controlling your blood glucose, hyperglycemia could occur because of some of the following reasons: °· Not following your meal plan. °· Not taking your diabetes medications or not taking it properly. °· Exercising less or doing less activity than you normally do. °· Being sick. °Pre-diabetes °· This cannot be ignored. Before people develop Type 2 diabetes, they almost always have "pre-diabetes." This is when your blood glucose levels are higher than normal, but not yet high enough to be diagnosed as diabetes. Research has shown that some long-term damage to the body, especially the heart and circulatory system, may already be occurring during pre-diabetes. If you take action to manage your blood glucose when you have pre-diabetes, you may delay or prevent Type 2 diabetes from developing. °Stress °· If you have diabetes, you may be "diet" controlled or on oral medications or insulin to control your diabetes. However, you may find that your blood glucose is higher than usual in the hospital whether you have diabetes or not. This is often referred to as "stress hyperglycemia." Stress can elevate your blood glucose. This happens because of hormones put out by the body during times of stress. If stress has been the cause of your high blood glucose, it can be followed regularly by your caregiver. That way he/she can make sure your hyperglycemia does not continue to get worse or progress to  diabetes. °Steroids °· Steroids are medications that act on the infection fighting system (immune system) to block inflammation or infection. One side effect can be a rise in blood glucose. Most people can produce enough extra insulin to allow for this rise, but for those who cannot, steroids make blood glucose levels go even higher. It is not unusual for steroid treatments to "uncover" diabetes that is developing. It is not always possible to determine if the hyperglycemia will go away after the steroids are stopped. A special blood test called an A1c is sometimes done to determine if your blood glucose was elevated before the steroids were started. °SYMPTOMS °· Thirsty. °· Frequent urination. °· Dry mouth. °· Blurred vision. °· Tired or fatigue. °· Weakness. °· Sleepy. °· Tingling in feet or leg. °DIAGNOSIS  °Diagnosis is made by monitoring blood glucose in one or all of the following ways: °· A1c test. This is a chemical found in your blood. °· Fingerstick blood glucose monitoring. °· Laboratory results. °TREATMENT  °First, knowing the cause of the hyperglycemia is important before the hyperglycemia can be treated. Treatment may include, but is not be limited to: °· Education. °· Change or adjustment in medications. °· Change or adjustment in meal plan. °· Treatment for an illness, infection, etc. °· More frequent blood glucose monitoring. °· Change in exercise plan. °· Decreasing or stopping steroids. °· Lifestyle changes. °HOME CARE INSTRUCTIONS  °· Test your blood glucose as directed. °· Exercise regularly. Your caregiver will give you instructions about exercise. Pre-diabetes or diabetes which comes on with stress is helped by exercising. °· Eat wholesome,   balanced meals. Eat often and at regular, fixed times. Your caregiver or nutritionist will give you a meal plan to guide your sugar intake. °· Being at an ideal weight is important. If needed, losing as little as 10 to 15 pounds may help improve blood  glucose levels. °SEEK MEDICAL CARE IF:  °· You have questions about medicine, activity, or diet. °· You continue to have symptoms (problems such as increased thirst, urination, or weight gain). °SEEK IMMEDIATE MEDICAL CARE IF:  °· You are vomiting or have diarrhea. °· Your breath smells fruity. °· You are breathing faster or slower. °· You are very sleepy or incoherent. °· You have numbness, tingling, or pain in your feet or hands. °· You have chest pain. °· Your symptoms get worse even though you have been following your caregiver's orders. °· If you have any other questions or concerns. °  °This information is not intended to replace advice given to you by your health care provider. Make sure you discuss any questions you have with your health care provider. °  °Document Released: 01/01/2001 Document Revised: 09/30/2011 Document Reviewed: 03/14/2015 °Elsevier Interactive Patient Education ©2016 [employer] ° °Nausea and Vomiting °Nausea is a sick feeling that often comes before throwing up (vomiting). Vomiting is a reflex where stomach contents come out of your mouth. Vomiting can cause severe loss of body fluids (dehydration). Children and elderly adults can become dehydrated quickly, especially if they also have diarrhea. Nausea and vomiting are symptoms of a condition or disease. It is important to find the cause of your symptoms. °CAUSES  °· Direct irritation of the stomach lining. This irritation can result from increased acid production (gastroesophageal reflux disease), infection, food poisoning, taking certain medicines (such as nonsteroidal anti-inflammatory drugs), alcohol use, or tobacco use. °· Signals from the brain. These signals could be caused by a headache, heat exposure, an inner ear disturbance, increased pressure in the brain from injury, infection, a tumor, or a concussion, pain, emotional stimulus, or metabolic problems. °· An obstruction in the gastrointestinal tract (bowel  obstruction). °· Illnesses such as diabetes, hepatitis, gallbladder problems, appendicitis, kidney problems, cancer, sepsis, atypical symptoms of a heart attack, or eating disorders. °· Medical treatments such as chemotherapy and radiation. °· Receiving medicine that makes you sleep (general anesthetic) during surgery. °DIAGNOSIS °Your caregiver may ask for tests to be done if the problems do not improve after a few days. Tests may also be done if symptoms are severe or if the reason for the nausea and vomiting is not clear. Tests may include: °· Urine tests. °· Blood tests. °· Stool tests. °· Cultures (to look for evidence of infection). °· X-rays or other imaging studies. °Test results can help your caregiver make decisions about treatment or the need for additional tests. °TREATMENT °You need to stay well hydrated. Drink frequently but in small amounts. You may wish to drink water, sports drinks, clear broth, or eat frozen ice pops or gelatin dessert to help stay hydrated. When you eat, eating slowly may help prevent nausea. There are also some antinausea medicines that may help prevent nausea. °HOME CARE INSTRUCTIONS  °· Take all medicine as directed by your caregiver. °· If you do not have an appetite, do not force yourself to eat. However, you must continue to drink fluids. °· If you have an appetite, eat a normal diet unless your caregiver tells you differently. °¨ Eat a variety of complex carbohydrates (rice, wheat, potatoes, bread), lean meats, yogurt, fruits, and vegetables. °¨ Avoid high-fat foods   because they are more difficult to digest. °· Drink enough water and fluids to keep your urine clear or pale yellow. °· If you are dehydrated, ask your caregiver for specific rehydration instructions. Signs of dehydration may include: °¨ Severe thirst. °¨ Dry lips and mouth. °¨ Dizziness. °¨ Dark urine. °¨ Decreasing urine frequency and amount. °¨ Confusion. °¨ Rapid breathing or pulse. °SEEK IMMEDIATE MEDICAL  CARE IF:  °· You have blood or brown flecks (like coffee grounds) in your vomit. °· You have black or bloody stools. °· You have a severe headache or stiff neck. °· You are confused. °· You have severe abdominal pain. °· You have chest pain or trouble breathing. °· You do not urinate at least once every 8 hours. °· You develop cold or clammy skin. °· You continue to vomit for longer than 24 to 48 hours. °· You have a fever. °MAKE SURE YOU:  °· Understand these instructions. °· Will watch your condition. °· Will get help right away if you are not doing well or get worse. °  °This information is not intended to replace advice given to you by your health care provider. Make sure you discuss any questions you have with your health care provider. °  °Document Released: 07/08/2005 Document Revised: 09/30/2011 Document Reviewed: 12/05/2010 °Elsevier Interactive Patient Education ©2016 [employer] ° °

## 2015-09-06 NOTE — ED Notes (Signed)
Patient in recliner in sub-wait area.  Sleeping, rouses easily.  Given blanket and pillow per request.  IV infusing.

## 2015-09-06 NOTE — ED Provider Notes (Addendum)
[facility] Emergency Department Provider Note     Time seen: ----------------------------------------- 8:58 AM on 09/06/2015 -----------------------------------------    I have reviewed the triage vital signs and the nursing notes.   HISTORY  Chief Complaint Hyperglycemia and Emesis    HPI Trevor Brown is a 25 y.o. male who presents to ER with nausea vomiting. Patient has history of type 1 diabetes and his blood sugar was in the 500s. Patient has had DKA in the past, states she has rather Lantus for the last 48 hours. Patient states he took 30 units of NovoLog at 04 100, normally takes 5 units before each meal. He denies any recent illness. Patient has a history of noncompliance.   Past Medical History  Diagnosis Date  . Diabetes mellitus without complication (HCC)   . Hepatitis C, acute may 2016  . Heart attack (HCC)     Pt claims he had heart attack a year ago ( 2015)- and was admitted in [facility] for that, but not given any meds or angiogram, on review of chart- I could not find any details like that.    Patient Active Problem List   Diagnosis Date Noted  . DKA (diabetic ketoacidoses) (HCC) 06/10/2015  . Hidradenitis suppurativa of left axilla   . DKA, type 1 (HCC) 06/06/2015  . Malnutrition of moderate degree (HCC) 04/08/2015  . Hepatitis C 12/13/2014  . Diabetes type 1, uncontrolled (HCC) 12/11/2014  . Major depressive disorder, single episode, mild (HCC)   . Major depression, single episode 12/10/2014  . Diabetes mellitus type 1, uncontrolled (HCC) 12/09/2014  . Transaminitis 12/09/2014  . Folliculitis 12/09/2014  . Homelessness 12/09/2014    History reviewed. No pertinent past surgical history.  Allergies Bee venom; Penicillins; Ibuprofen; Tramadol; and Vancomycin  Social History Social History  Substance Use Topics  . Smoking status: Current Every Day Smoker -- 1.50 packs/day    Types: Cigarettes  . Smokeless tobacco: None  .  Alcohol Use: No    Review of Systems Constitutional: Negative for fever. Eyes: Negative for visual changes. ENT: Negative for sore throat. Cardiovascular: Negative for chest pain. Respiratory: Negative for shortness of breath. Gastrointestinal: Negative for abdominal pain, positive for nausea vomiting Genitourinary: Negative for dysuria. Musculoskeletal: Negative for back pain. Skin: Negative for rash. Neurological: Negative for headaches, focal weakness or numbness.  10-point ROS otherwise negative.  ____________________________________________   PHYSICAL EXAM:  VITAL SIGNS: ED Triage Vitals  Enc Vitals Group     BP 09/06/15 0746 131/90 mmHg     Pulse Rate 09/06/15 0746 109     Resp 09/06/15 0746 18     Temp 09/06/15 0746 98.3 F (36.8 C)     Temp Source 09/06/15 0746 Oral     SpO2 09/06/15 0746 97 %     Weight 09/06/15 0746 120 lb (54.432 kg)     Height 09/06/15 0746  (1.626 m)     Head Cir --      Peak Flow --      Pain Score 09/06/15 0747 6     Pain Loc --      Pain Edu? --      Excl. in GC? --     Constitutional: Alert and oriented. Well appearing and in no distress. Eyes: Conjunctivae are normal. PERRL. Normal extraocular movements. ENT   Head: Normocephalic and atraumatic.   Nose: No congestion/rhinnorhea.   Mouth/Throat: Mucous membranes are moist.   Neck: No stridor. Cardiovascular: Normal rate, regular rhythm. Normal  and symmetric distal pulses are present in all extremities. No murmurs, rubs, or gallops. Respiratory: Normal respiratory effort without tachypnea nor retractions. Breath sounds are clear and equal bilaterally. No wheezes/rales/rhonchi. Gastrointestinal: Soft and nontender. No distention. No abdominal bruits.  Musculoskeletal: Nontender with normal range of motion in all extremities. No joint effusions.  No lower extremity tenderness nor edema. Neurologic:  Normal speech and language. No gross focal neurologic deficits are  appreciated. Speech is normal. No gait instability. Skin:  Skin is warm, dry and intact. No rash noted. Psychiatric: Mood and affect are normal. Speech and behavior are normal. Patient exhibits appropriate insight and judgment. ____________________________________________  ED COURSE:  Pertinent labs & imaging results that were available during my care of the patient were reviewed by me and considered in my medical decision making (see chart for details). Patient sent acute distress, will check basic labs and assess for DKA. He'll receive IV fluids and antiemetics. ____________________________________________    LABS (pertinent positives/negatives)  Labs Reviewed  GLUCOSE, CAPILLARY - Abnormal; Notable for the following:    Glucose-Capillary 304 (*)    All other components within normal limits  COMPREHENSIVE METABOLIC PANEL - Abnormal; Notable for the following:    Chloride 95 (*)    Glucose, Bld 285 (*)    BUN 26 (*)    Total Protein 8.9 (*)    AST 77 (*)    Alkaline Phosphatase 167 (*)    Anion gap 16 (*)    All other components within normal limits  CBC - Abnormal; Notable for the following:    WBC 13.8 (*)    All other components within normal limits  LIPASE, BLOOD  URINALYSIS COMPLETEWITH MICROSCOPIC ([facility] ONLY)   ____________________________________________  FINAL ASSESSMENT AND PLAN  Hyperglycemia, vomiting  Plan: Patient with labs as dictated above. Symptoms are likely viral. He is not appear to be in DKA at this time. He was given fluids and antiemetics and will be encouraged to fill his medications. Patient has Medicaid and should be able to fill his Lantus. He is stable for outpatient follow-up with his doctor.   Emily Filbert, MD   Emily Filbert, MD 09/06/15 7829  Emily Filbert, MD 09/06/15 [phone]

## 2015-09-07 NOTE — Care Management Note (Signed)
Case Management Note  Patient Details  Name: TRAVON CROCHET MRN: [id number] Date of Birth: December 30, 1990  Subjective/Objective:       Call from Medication Management     . The pt. Sent a friend to them to pick up Lantus, but they do not have any. Dr Mayford Knife has verbally ordered Levemir to be given to the pt. They will also give the person picking up the medication an  [facility] application. The pt. Medicaid per them  And the patient is family planning only coverage.         Action/Plan:   Expected Discharge Date:                  Expected Discharge Plan:     In-House Referral:     Discharge planning Services     Post Acute Care Choice:    Choice offered to:     DME Arranged:    DME Agency:     HH Arranged:    HH Agency:     Status of Service:     Medicare Important Message Given:    Date Medicare IM Given:    Medicare IM give by:    Date Additional Medicare IM Given:    Additional Medicare Important Message give by:     If discussed at Long Length of Stay Meetings, dates discussed:    Additional Comments:  Berna Bue, RN 09/07/2015, 10:23 AM

## 2015-10-23 ENCOUNTER — Inpatient Hospital Stay
Admission: EM | Admit: 2015-10-23 | Discharge: 2015-10-25 | DRG: 639 | Disposition: A | Discharge status: Home / Self Care | Payer: Self-pay | Attending: Internal Medicine | Admitting: Internal Medicine

## 2015-10-23 DIAGNOSIS — E875 Hyperkalemia: Secondary | ICD-10-CM | POA: Diagnosis present

## 2015-10-23 DIAGNOSIS — Z833 Family history of diabetes mellitus: Secondary | ICD-10-CM

## 2015-10-23 DIAGNOSIS — D72829 Elevated white blood cell count, unspecified: Secondary | ICD-10-CM | POA: Diagnosis present

## 2015-10-23 DIAGNOSIS — B1920 Unspecified viral hepatitis C without hepatic coma: Secondary | ICD-10-CM | POA: Diagnosis present

## 2015-10-23 DIAGNOSIS — F17210 Nicotine dependence, cigarettes, uncomplicated: Secondary | ICD-10-CM | POA: Diagnosis present

## 2015-10-23 DIAGNOSIS — Z888 Allergy status to other drugs, medicaments and biological substances status: Secondary | ICD-10-CM

## 2015-10-23 DIAGNOSIS — I252 Old myocardial infarction: Secondary | ICD-10-CM

## 2015-10-23 DIAGNOSIS — Z9114 Patient's other noncompliance with medication regimen: Secondary | ICD-10-CM

## 2015-10-23 DIAGNOSIS — E1010 Type 1 diabetes mellitus with ketoacidosis without coma: Principal | ICD-10-CM | POA: Diagnosis present

## 2015-10-23 DIAGNOSIS — Z880 Allergy status to penicillin: Secondary | ICD-10-CM

## 2015-10-23 DIAGNOSIS — Z794 Long term (current) use of insulin: Secondary | ICD-10-CM

## 2015-10-23 LAB — BASIC METABOLIC PANEL
Anion gap: 26 — ABNORMAL HIGH (ref 5–15)
Anion gap: 14 (ref 5–15)
BUN: 17 mg/dL (ref 6–20)
BUN: 17 mg/dL (ref 6–20)
CO2: 9 mmol/L — ABNORMAL LOW (ref 22–32)
CO2: 11 mmol/L — ABNORMAL LOW (ref 22–32)
Calcium: 9.7 mg/dL (ref 8.9–10.3)
Calcium: 8.8 mg/dL — ABNORMAL LOW (ref 8.9–10.3)
Chloride: 98 mmol/L — ABNORMAL LOW (ref 101–111)
Chloride: 115 mmol/L — ABNORMAL HIGH (ref 101–111)
Creatinine, Ser: 1.36 mg/dL — ABNORMAL HIGH (ref 0.61–1.24)
Creatinine, Ser: 1.07 mg/dL (ref 0.61–1.24)
GFR calc Af Amer: >60 mL/min (ref >60)
GFR calc Af Amer: >60 mL/min (ref >60)
GFR calc non Af Amer: >60 mL/min (ref >60)
GFR calc non Af Amer: >60 mL/min (ref >60)
Glucose, Bld: 598 mg/dL (ref 65–99)
Glucose, Bld: 246 mg/dL — ABNORMAL HIGH (ref 65–99)
Potassium: 6.6 mmol/L (ref 3.5–5.1)
Potassium: 4.4 mmol/L (ref 3.5–5.1)
Sodium: 133 mmol/L — ABNORMAL LOW (ref 135–145)
Sodium: 140 mmol/L (ref 135–145)

## 2015-10-23 LAB — GLUCOSE, CAPILLARY
Glucose-Capillary: 488 mg/dL — ABNORMAL HIGH (ref 65–99)
Glucose-Capillary: 364 mg/dL — ABNORMAL HIGH (ref 65–99)
Glucose-Capillary: 301 mg/dL — ABNORMAL HIGH (ref 65–99)
Glucose-Capillary: 325 mg/dL — ABNORMAL HIGH (ref 65–99)
Glucose-Capillary: 187 mg/dL — ABNORMAL HIGH (ref 65–99)
Glucose-Capillary: 123 mg/dL — ABNORMAL HIGH (ref 65–99)

## 2015-10-23 LAB — CBC WITH DIFFERENTIAL/PLATELET
Basophils Absolute: 0.1 10*3/uL (ref 0–0.1)
Basophils Relative: 0 %
Eosinophils Absolute: 0 10*3/uL (ref 0–0.7)
Eosinophils Relative: 0 %
HCT: 49 % (ref 40.0–52.0)
Hemoglobin: 15.9 g/dL (ref 13.0–18.0)
Lymphocytes Relative: 6 %
Lymphs Abs: 1.1 10*3/uL (ref 1.0–3.6)
MCH: 29.8 pg (ref 26.0–34.0)
MCHC: 32.4 g/dL (ref 32.0–36.0)
MCV: 92.1 fL (ref 80.0–100.0)
Monocytes Absolute: 0.5 10*3/uL (ref 0.2–1.0)
Monocytes Relative: 3 %
Neutro Abs: 14.8 10*3/uL — ABNORMAL HIGH (ref 1.4–6.5)
Neutrophils Relative %: 91 %
Platelets: 292 10*3/uL (ref 150–440)
RBC: 5.32 MIL/uL (ref 4.40–5.90)
RDW: 13.6 % (ref 11.5–14.5)
WBC: 16.4 10*3/uL — ABNORMAL HIGH (ref 3.8–10.6)

## 2015-10-23 LAB — URINALYSIS COMPLETE WITH MICROSCOPIC (ARMC ONLY)
Bacteria, UA: NONE SEEN
Bilirubin Urine: NEGATIVE
Glucose, UA: >500 mg/dL — AB
Hgb urine dipstick: NEGATIVE
Leukocytes, UA: NEGATIVE
Nitrite: NEGATIVE
Protein, ur: 30 mg/dL — AB
RBC / HPF: NONE SEEN RBC/hpf (ref 0–5)
Specific Gravity, Urine: 1.024 (ref 1.005–1.030)
Squamous Epithelial / LPF: NONE SEEN
WBC, UA: NONE SEEN WBC/hpf (ref 0–5)
pH: 5 (ref 5.0–8.0)

## 2015-10-23 LAB — BLOOD GAS, VENOUS
Acid-base deficit: 21.1 mmol/L — ABNORMAL HIGH (ref 0.0–2.0)
Bicarbonate: 8.3 mEq/L — ABNORMAL LOW (ref 21.0–28.0)
Patient temperature: 37
pCO2, Ven: 30 mmHg — ABNORMAL LOW (ref 44.0–60.0)
pH, Ven: 7.05 — CL (ref 7.320–7.430)
pO2, Ven: <31 mmHg — ABNORMAL LOW (ref 31.0–45.0)

## 2015-10-23 LAB — MRSA PCR SCREENING: MRSA by PCR: POSITIVE — AB

## 2015-10-23 MED ORDER — ACETAMINOPHEN 650 MG RE SUPP: 650.0000 mg | Freq: Four times a day (QID) | RECTAL | Status: DC | PRN

## 2015-10-23 MED ORDER — SODIUM CHLORIDE 0.9 % IV SOLN
INTRAVENOUS | Status: DC
  Administered 2015-10-23: 19:00:00 via INTRAVENOUS

## 2015-10-23 MED ORDER — ACETAMINOPHEN 160 MG/5ML PO SOLN
650.0000 mg | Freq: Once | ORAL | Status: AC
  Administered 2015-10-23: 650 mg via ORAL

## 2015-10-23 MED ORDER — HYDROCODONE-ACETAMINOPHEN 5-325 MG PO TABS
1.0000 | ORAL_TABLET | ORAL | Status: DC | PRN
  Administered 2015-10-23 – 2015-10-24 (×6): 2 via ORAL
  Administered 2015-10-25: 1 via ORAL
  Administered 2015-10-25 (×2): 2 via ORAL
  Filled 2015-10-23 (×9): qty 2

## 2015-10-23 MED ORDER — ENOXAPARIN SODIUM 40 MG/0.4ML ~~LOC~~ SOLN: 40.0000 mg | SUBCUTANEOUS | Status: DC

## 2015-10-23 MED ORDER — CHLORHEXIDINE GLUCONATE CLOTH 2 % EX PADS
6.0000 | MEDICATED_PAD | Freq: Every day | CUTANEOUS | Status: DC
  Administered 2015-10-25: 6 via TOPICAL

## 2015-10-23 MED ORDER — ONDANSETRON HCL 4 MG PO TABS
4.0000 mg | ORAL_TABLET | Freq: Four times a day (QID) | ORAL | Status: DC | PRN
  Administered 2015-10-24: 4 mg via ORAL
  Filled 2015-10-23: qty 1

## 2015-10-23 MED ORDER — BISACODYL 5 MG PO TBEC: 5.0000 mg | DELAYED_RELEASE_TABLET | Freq: Every day | ORAL | Status: DC | PRN

## 2015-10-23 MED ORDER — SODIUM CHLORIDE 0.9 % IV BOLUS (SEPSIS)
1000.0000 mL | Freq: Once | INTRAVENOUS | Status: AC
  Administered 2015-10-23: 1000 mL via INTRAVENOUS

## 2015-10-23 MED ORDER — SODIUM CHLORIDE 0.9 % IV SOLN: INTRAVENOUS | Status: DC

## 2015-10-23 MED ORDER — ONDANSETRON HCL 4 MG/2ML IJ SOLN: 4.0000 mg | Freq: Four times a day (QID) | INTRAMUSCULAR | Status: DC | PRN

## 2015-10-23 MED ORDER — PANTOPRAZOLE SODIUM 40 MG IV SOLR
40.0000 mg | Freq: Two times a day (BID) | INTRAVENOUS | Status: DC
Start: 2015-10-23 — End: 2015-10-24
  Administered 2015-10-24 (×2): 40 mg via INTRAVENOUS
  Filled 2015-10-23 (×2): qty 40

## 2015-10-23 MED ORDER — SODIUM CHLORIDE 0.9 % IV SOLN
INTRAVENOUS | Status: DC
  Filled 2015-10-23: qty 2.5

## 2015-10-23 MED ORDER — DEXTROSE-NACL 5-0.45 % IV SOLN
INTRAVENOUS | Status: DC
  Administered 2015-10-23: via INTRAVENOUS

## 2015-10-23 MED ORDER — SODIUM CHLORIDE 0.9 % IV SOLN
1.0000 g | Freq: Once | INTRAVENOUS | Status: AC
  Administered 2015-10-23: 1 g via INTRAVENOUS
  Filled 2015-10-23: qty 10

## 2015-10-23 MED ORDER — MUPIROCIN 2 % EX OINT
1.0000 "application " | TOPICAL_OINTMENT | Freq: Two times a day (BID) | CUTANEOUS | Status: DC
  Administered 2015-10-24 – 2015-10-25 (×3): 1 via NASAL
  Filled 2015-10-23 (×3): qty 22

## 2015-10-23 MED ORDER — ONDANSETRON HCL 4 MG/2ML IJ SOLN
4.0000 mg | Freq: Once | INTRAMUSCULAR | Status: AC
  Administered 2015-10-23: 4 mg via INTRAVENOUS
  Filled 2015-10-23: qty 2

## 2015-10-23 MED ORDER — ACETAMINOPHEN 325 MG PO TABS: 650.0000 mg | ORAL_TABLET | Freq: Once | ORAL | Status: DC

## 2015-10-23 MED ORDER — SODIUM CHLORIDE 0.9 % IV SOLN
INTRAVENOUS | Status: DC
  Administered 2015-10-23: 5.4 [IU]/h via INTRAVENOUS
  Filled 2015-10-23: qty 2.5

## 2015-10-23 MED ORDER — ACETAMINOPHEN 325 MG PO TABS: 650.0000 mg | ORAL_TABLET | Freq: Four times a day (QID) | ORAL | Status: DC | PRN

## 2015-10-23 MED ORDER — DOCUSATE SODIUM 100 MG PO CAPS: 100.0000 mg | ORAL_CAPSULE | Freq: Two times a day (BID) | ORAL | Status: DC

## 2015-10-23 MED ORDER — STERILE WATER FOR INJECTION IV SOLN
INTRAVENOUS | Status: DC
  Administered 2015-10-23: 19:00:00 via INTRAVENOUS
  Filled 2015-10-23 (×2): qty 850

## 2015-10-23 MED ORDER — POTASSIUM CHLORIDE 10 MEQ/100ML IV SOLN: 10.0000 meq | INTRAVENOUS | Status: DC

## 2015-10-23 MED ORDER — ACETAMINOPHEN 160 MG/5ML PO SUSP
ORAL | Status: AC
  Administered 2015-10-23: 650 mg via ORAL
  Filled 2015-10-23: qty 25

## 2015-10-23 MED ORDER — MORPHINE SULFATE (PF) 2 MG/ML IV SOLN
2.0000 mg | INTRAVENOUS | Status: DC | PRN
  Administered 2015-10-23 – 2015-10-24 (×3): 2 mg via INTRAVENOUS
  Filled 2015-10-23 (×3): qty 1

## 2015-10-23 NOTE — ED Provider Notes (Signed)
[facility] Emergency Department Provider Note   ____________________________________________  Time seen: ~1530  I have reviewed the triage vital signs and the nursing notes.   HISTORY  Chief Complaint Hyperglycemia   History limited by: Not Limited   HPI Trevor Brown is a 25 y.o. male with history of insulin dependent diabetes who presents to the emergency department today because of concern for nausea and vomiting. Patient states that these symptoms have been going on for the past two days. He states that he has not been able to drink or eat anything. States that he has associated symptoms of dry mouth. He states that he has been out of his insulin for one week. He is not sure why he ran out of his insulin. He states he does not have a doctor.   Past Medical History  Diagnosis Date  . Diabetes mellitus without complication (HCC)   . Hepatitis C, acute may 2016  . Heart attack (HCC)     Pt claims he had heart attack a year ago ( 2015)- and was admitted in [facility] for that, but not given any meds or angiogram, on review of chart- I could not find any details like that.    Patient Active Problem List   Diagnosis Date Noted  . DKA (diabetic ketoacidoses) (HCC) 06/10/2015  . Hidradenitis suppurativa of left axilla   . DKA, type 1 (HCC) 06/06/2015  . Malnutrition of moderate degree (HCC) 04/08/2015  . Hepatitis C 12/13/2014  . Diabetes type 1, uncontrolled (HCC) 12/11/2014  . Major depressive disorder, single episode, mild (HCC)   . Major depression, single episode 12/10/2014  . Diabetes mellitus type 1, uncontrolled (HCC) 12/09/2014  . Transaminitis 12/09/2014  . Folliculitis 12/09/2014  . Homelessness 12/09/2014    History reviewed. No pertinent past surgical history.  Current Outpatient Rx  Name  Route  Sig  Dispense  Refill  . insulin aspart (NOVOLOG) 100 UNIT/ML injection   Subcutaneous   Inject 5 Units into the skin 3 (three) times daily  before meals.         . insulin aspart (NOVOLOG) 100 UNIT/ML injection      5 units three times daily with meals   10 mL   3   . insulin glargine (LANTUS) 100 UNIT/ML injection   Subcutaneous   Inject 20 Units into the skin at bedtime.         . insulin glargine (LANTUS) 100 UNIT/ML injection   Subcutaneous   Inject 0.2 mLs (20 Units total) into the skin at bedtime.   10 mL   11   . promethazine (PHENERGAN) 25 MG tablet   Oral   Take 1 tablet (25 mg total) by mouth every 6 (six) hours as needed for nausea or vomiting.   20 tablet   0   . sulfamethoxazole-trimethoprim (BACTRIM DS,SEPTRA DS) 800-160 MG tablet   Oral   Take 1 tablet by mouth 2 (two) times daily. Patient not taking: Reported on 09/06/2015   20 tablet   0     Allergies Bee venom; Penicillins; Ibuprofen; Tramadol; and Vancomycin  Family History  Problem Relation Age of Onset  . Cirrhosis Mother   . Diabetes Mellitus II Maternal Grandmother     Social History Social History  Substance Use Topics  . Smoking status: Current Every Day Smoker -- 1.50 packs/day    Types: Cigarettes  . Smokeless tobacco: None  . Alcohol Use: No    Review of Systems  Constitutional:  Negative for fever. Cardiovascular: Negative for chest pain. Respiratory: Negative for shortness of breath. Gastrointestinal: Positive for nausea and vomiting. Neurological: Negative for headaches, focal weakness or numbness.  10-point ROS otherwise negative.  ____________________________________________   PHYSICAL EXAM:  VITAL SIGNS: ED Triage Vitals  Enc Vitals Group     BP 10/23/15 1500 135/80 mmHg     Pulse Rate 10/23/15 1500 89     Resp 10/23/15 1500 24     Temp 10/23/15 1500 97 F (36.1 C)     Temp Source 10/23/15 1500 Oral     SpO2 10/23/15 1500 100 %     Weight 10/23/15 1500 115 lb (52.164 kg)     Height 10/23/15 1500 5\' 5"  (1.651 m)     Head Cir --      Peak Flow --      Pain Score 10/23/15 1500 10     Pain  Loc --      Pain Edu? --      Excl. in GC? --      Constitutional: Alert and oriented. Chronically ill appearing, cachectic.  Eyes: Conjunctivae are normal. PERRL. Normal extraocular movements. ENT   Head: Normocephalic and atraumatic.   Nose: No congestion/rhinnorhea.   Mouth/Throat: Mucous membranes are moist. Poor dentition.   Neck: No stridor. Hematological/Lymphatic/Immunilogical: No cervical lymphadenopathy. Cardiovascular: Normal rate, regular rhythm.  No murmurs, rubs, or gallops. Respiratory: Normal respiratory effort without tachypnea nor retractions. Breath sounds are clear and equal bilaterally. No wheezes/rales/rhonchi. Gastrointestinal: Soft and nontender. No distention. There is no CVA tenderness. Genitourinary: Deferred Musculoskeletal: Normal range of motion in all extremities. No joint effusions.  No lower extremity tenderness nor edema. Neurologic:  Normal speech and language. No gross focal neurologic deficits are appreciated.  Skin:  Skin is warm, dry and intact. Track marks noted on upper extremities.   ____________________________________________    LABS (pertinent positives/negatives)  Na 133 K 6.6 Glucose 598 Cr 1.36 Anion gap 26 WBC 16.4 PH 7.05  ____________________________________________   EKG  I, Phineas SemenGraydon Lilyan Prete, attending physician, personally viewed and interpreted this EKG  EKG Time: 1651 Rate: 88 Rhythm: normal sinus rhythm Axis: normal Intervals: qtc 452 QRS: narrow ST changes: no st elevation, peaked t waves V4 Impression: abnormal ekg   ____________________________________________    RADIOLOGY  None  ____________________________________________   PROCEDURES  Procedure(s) performed: None  Critical Care performed: Yes, see critical care note(s)  CRITICAL CARE Performed by: Phineas SemenGOODMAN, Taimur Fier   Total critical care time: 35  minutes  Critical care time was exclusive of separately billable procedures  and treating other patients.  Critical care was necessary to treat or prevent imminent or life-threatening deterioration.  Critical care was time spent personally by me on the following activities: development of treatment plan with patient and/or surrogate as well as nursing, discussions with consultants, evaluation of patient's response to treatment, examination of patient, obtaining history from patient or surrogate, ordering and performing treatments and interventions, ordering and review of laboratory studies, ordering and review of radiographic studies, pulse oximetry and re-evaluation of patient's condition.  ____________________________________________   INITIAL IMPRESSION / ASSESSMENT AND PLAN / ED COURSE  Pertinent labs & imaging results that were available during my care of the patient were reviewed by me and considered in my medical decision making (see chart for details).  Patient presented to the emergency department today because of concerns for nausea and vomiting in the setting of noncompliance with his insulin. He states he has been out of his insulin  for one week. On exam patient is chronically ill-appearing. Appears quite dry. Blood work is consistent with diabetic ketoacidosis. Patient was started on an insulin drip. Additionally potassium was elevated and he did have peaked T waves on EKG so he was given calcium. Patient will be admitted under the hospitalist service.  ____________________________________________   FINAL CLINICAL IMPRESSION(S) / ED DIAGNOSES  Final diagnoses:  Diabetic ketoacidosis without coma associated with type 1 diabetes mellitus (HCC)     Phineas Semen, MD 10/23/15 2328

## 2015-10-23 NOTE — ED Notes (Signed)
Pt comes into the ED via ems from home.. States he has been out of his insulin for the past week, states he has had N/V for the past 2 days.. Mucous membranes are dry..Marland Kitchen

## 2015-10-23 NOTE — H&P (Signed)
[facility] Physicians - [place] at [facility]   PATIENT NAME: Trevor Brown    MR#:  [id number]  DATE OF BIRTH:  05-Sep-1990  DATE OF ADMISSION:  10/23/2015  PRIMARY CARE PHYSICIAN: No PCP Per Patient   REQUESTING/REFERRING PHYSICIAN: Dr. Derrill Kay  CHIEF COMPLAINT:   Chief Complaint  Patient presents with  . Hyperglycemia    HISTORY OF PRESENT ILLNESS:  Trevor Brown  is a 25 y.o. male with a known history of diabetes mellitus type 1, hepatitis C with previous multiple hospitalizations for DKA comes in because of nausea, vomiting since this morning. Patient ran out of insulin for a week. Patient not taking NovoLog, Levemir for 1 week because he ran out of insulin. Patient also complains of mild abdominal pain. No diarrhea. Patient found to have blood sugar more than 500, dka showing anion gap 26 with pH 7.05 and bicarbonate of 9, potassium 6.6. Patient will be admitted to ICU for DKA. Patient has multiple recurrent admissions for the same. Right now he is requesting IV pain medicine for leg pains.  PAST MEDICAL HISTORY:   Past Medical History  Diagnosis Date  . Diabetes mellitus without complication (HCC)   . Hepatitis C, acute may 2016  . Heart attack (HCC)     Pt claims he had heart attack a year ago ( 2015)- and was admitted in [facility] for that, but not given any meds or angiogram, on review of chart- I could not find any details like that.    PAST SURGICAL HISTOIRY:  History reviewed. No pertinent past surgical history.  SOCIAL HISTORY:   Social History  Substance Use Topics  . Smoking status: Current Every Day Smoker -- 1.50 packs/day    Types: Cigarettes  . Smokeless tobacco: Not on file  . Alcohol Use: No    FAMILY HISTORY:   Family History  Problem Relation Age of Onset  . Cirrhosis Mother   . Diabetes Mellitus II Maternal Grandmother     DRUG ALLERGIES:   Allergies  Allergen Reactions  . Bee Venom Anaphylaxis  . Penicillins Anaphylaxis, Hives  and Other (See Comments)    Has patient had a PCN reaction causing immediate rash, facial/tongue/throat swelling, SOB or lightheadedness with hypotension: Yes Has patient had a PCN reaction causing severe rash involving mucus membranes or skin necrosis: No Has patient had a PCN reaction that required hospitalization No Has patient had a PCN reaction occurring within the last 10 years: Yes If all of the above answers are "NO", then may proceed with Cephalosporin use.  . Ibuprofen Other (See Comments)    Reaction:  GI upset   . Tramadol Hives and Swelling  . Vancomycin Rash and Other (See Comments)    Reaction:  Red man's syndrome     REVIEW OF SYSTEMS:  CONSTITUTIONAL: No fever, fatigue or weakness.  EYES: No blurred or double vision.  EARS, NOSE, AND THROAT: No tinnitus or ear pain.  RESPIRATORY: No cough, shortness of breath, wheezing or hemoptysis.  CARDIOVASCULAR: No chest pain, orthopnea, edema.  GASTROINTESTINAL: Nausea, vomiting since this morning. GENITOURINARY: No dysuria, hematuria.  ENDOCRINE: No polyuria, nocturia,  HEMATOLOGY: No anemia, easy bruising or bleeding SKIN: No rash or lesion. MUSCULOSKELETAL: No joint pain or arthritis.   NEUROLOGIC: No tingling, numbness, weakness.  PSYCHIATRY: No anxiety or depression.   MEDICATIONS AT HOME:   Prior to Admission medications   Medication Sig Start Date End Date Taking? Authorizing Provider  insulin aspart (NOVOLOG) 100 UNIT/ML injection Inject 5  Units into the skin 3 (three) times daily before meals.    Historical Provider, MD  insulin aspart (NOVOLOG) 100 UNIT/ML injection 5 units three times daily with meals 09/06/15 09/05/16  Emily Filbert, MD  insulin glargine (LANTUS) 100 UNIT/ML injection Inject 20 Units into the skin at bedtime.    Historical Provider, MD  insulin glargine (LANTUS) 100 UNIT/ML injection Inject 0.2 mLs (20 Units total) into the skin at bedtime. 09/06/15   Emily Filbert, MD  promethazine  (PHENERGAN) 25 MG tablet Take 1 tablet (25 mg total) by mouth every 6 (six) hours as needed for nausea or vomiting. 09/06/15   Emily Filbert, MD  sulfamethoxazole-trimethoprim (BACTRIM DS,SEPTRA DS) 800-160 MG tablet Take 1 tablet by mouth 2 (two) times daily. Patient not taking: Reported on 09/06/2015 06/12/15   Houston Siren, MD      VITAL SIGNS:  Blood pressure 134/89, pulse 91, temperature 97 F (36.1 C), temperature source Oral, resp. rate 15, height  (1.651 m), weight 52.164 kg (115 lb), SpO2 100 %.  PHYSICAL EXAMINATION:  GENERAL:  25 y.o.-year-old patient lying in the bed with no acute distress. Mucosa is clinically dry. EYES: Pupils equal, round, reactive to light and accommodation. No scleral icterus. Extraocular muscles intact.  HEENT: Head atraumatic, normocephalic. Oropharynx and nasopharynx clear. Poor dental hygiene. NECK:  Supple, no jugular venous distention. No thyroid enlargement, no tenderness.  LUNGS: Normal breath sounds bilaterally, no wheezing, rales,rhonchi or crepitation. No use of accessory muscles of respiration.  CARDIOVASCULAR: S1, S2 normal. No murmurs, rubs, or gallops.  ABDOMEN: Soft, nontender, nondistended. Bowel sounds present. No organomegaly or mass.  EXTREMITIES: No pedal edema, cyanosis, or clubbing.  NEUROLOGIC: Cranial nerves II through XII are intact. Muscle strength 5/5 in all extremities. Sensation intact. Gait not checked.  PSYCHIATRIC: The patient is alert and oriented x 3.  SKIN: No obvious rash, lesion, or ulcer.   LABORATORY PANEL:   CBC  Recent Labs Lab 10/23/15 1527  WBC 16.4*  HGB 15.9  HCT 49.0  PLT 292   ------------------------------------------------------------------------------------------------------------------  Chemistries   Recent Labs Lab 10/23/15 1527  NA 133*  K 6.6*  CL 98*  CO2 9*  GLUCOSE 598*  BUN 17  CREATININE 1.36*  CALCIUM 9.7    ------------------------------------------------------------------------------------------------------------------  Cardiac Enzymes No results for input(s): TROPONINI in the last 168 hours. ------------------------------------------------------------------------------------------------------------------  RADIOLOGY:  No results found.  EKG:   Orders placed or performed during the hospital encounter of 10/23/15  . ED EKG  . ED EKG  . EKG 12-Lead  . EKG 12-Lead   Normal sinus rhythm with 88 bpm . Peaked T waves present in the urine V5, V6  IMPRESSION AND PLAN:   #1. Severe DKA with the evidence of severe metabolic acidosis with pH 7.0, bicarbonate of 9: Patient is admitted to intensive care unit, started on insulin drip, IV hydration, BMP every 4 hours, case management consult for insulin supply. Patient will be nothing by mouth, until the anion gap closes, after that patient will be started on the diet, converted to long-acting insulin, NovoLog.  #2 nausea and vomiting secondary to DKA: Continue IV hydration, IV Zofran, IV PPIs.  3. smoking counseled to quit smoking for about 5 minutes. Offered nicotine patch. #3 history of hepatitis C: Stable. #4 severe hyperkalemia with EKG changes: Patient received a Kayexalate, calcium gluconate I'm going to start him on bicarbonate drip because of severe   Metabolic acidosis with pH 7.0. Recheck The potassium, hopefully  insulin drip will take care of that. 5. leukocytosis is likely due to stress, had history of hydradenitis suppurative in the axilla: No evidence of infection at this time.    All the records are reviewed and case discussed with ED provider. Management plans discussed with the patient, family and they are in agreement.  CODE STATUS: Full code  TOTAL TIME TAKING CARE OF THIS PATIENT: 55 minutes. Critical care   Morell Mears M.D on 10/23/2015 at 5:44 PM  Between 7am to 6pm - Pager - [phone]  After 6pm go to  [email or web address] - password [email or web address] [facility]  [place] [facility]  Office  [phone]  CC: Primary care physician; No PCP Per Patient  Note: This dictation was prepared with Dragon dictation along with smaller phrase technology. Any transcriptional errors that result from this process are unintentional.

## 2015-10-24 LAB — BASIC METABOLIC PANEL
Anion gap: 8 (ref 5–15)
Anion gap: 5 (ref 5–15)
Anion gap: 10 (ref 5–15)
Anion gap: 6 (ref 5–15)
BUN: 16 mg/dL (ref 6–20)
BUN: 17 mg/dL (ref 6–20)
BUN: 16 mg/dL (ref 6–20)
BUN: 17 mg/dL (ref 6–20)
CO2: 16 mmol/L — ABNORMAL LOW (ref 22–32)
CO2: 20 mmol/L — ABNORMAL LOW (ref 22–32)
CO2: 16 mmol/L — ABNORMAL LOW (ref 22–32)
CO2: 20 mmol/L — ABNORMAL LOW (ref 22–32)
Calcium: 8.2 mg/dL — ABNORMAL LOW (ref 8.9–10.3)
Calcium: 8 mg/dL — ABNORMAL LOW (ref 8.9–10.3)
Calcium: 8.1 mg/dL — ABNORMAL LOW (ref 8.9–10.3)
Calcium: 8.1 mg/dL — ABNORMAL LOW (ref 8.9–10.3)
Chloride: 113 mmol/L — ABNORMAL HIGH (ref 101–111)
Chloride: 109 mmol/L (ref 101–111)
Chloride: 103 mmol/L (ref 101–111)
Chloride: 104 mmol/L (ref 101–111)
Creatinine, Ser: 0.86 mg/dL (ref 0.61–1.24)
Creatinine, Ser: 0.77 mg/dL (ref 0.61–1.24)
Creatinine, Ser: 0.79 mg/dL (ref 0.61–1.24)
Creatinine, Ser: 0.66 mg/dL (ref 0.61–1.24)
GFR calc Af Amer: >60 mL/min (ref >60)
GFR calc Af Amer: >60 mL/min (ref >60)
GFR calc Af Amer: >60 mL/min (ref >60)
GFR calc Af Amer: >60 mL/min (ref >60)
GFR calc non Af Amer: >60 mL/min (ref >60)
GFR calc non Af Amer: >60 mL/min (ref >60)
GFR calc non Af Amer: >60 mL/min (ref >60)
GFR calc non Af Amer: >60 mL/min (ref >60)
Glucose, Bld: 191 mg/dL — ABNORMAL HIGH (ref 65–99)
Glucose, Bld: 161 mg/dL — ABNORMAL HIGH (ref 65–99)
Glucose, Bld: 177 mg/dL — ABNORMAL HIGH (ref 65–99)
Glucose, Bld: 210 mg/dL — ABNORMAL HIGH (ref 65–99)
Potassium: 3.6 mmol/L (ref 3.5–5.1)
Potassium: 3.1 mmol/L — ABNORMAL LOW (ref 3.5–5.1)
Potassium: 3.6 mmol/L (ref 3.5–5.1)
Potassium: 3.5 mmol/L (ref 3.5–5.1)
Sodium: 137 mmol/L (ref 135–145)
Sodium: 134 mmol/L — ABNORMAL LOW (ref 135–145)
Sodium: 129 mmol/L — ABNORMAL LOW (ref 135–145)
Sodium: 130 mmol/L — ABNORMAL LOW (ref 135–145)

## 2015-10-24 LAB — GLUCOSE, CAPILLARY
Glucose-Capillary: 107 mg/dL — ABNORMAL HIGH (ref 65–99)
Glucose-Capillary: 110 mg/dL — ABNORMAL HIGH (ref 65–99)
Glucose-Capillary: 126 mg/dL — ABNORMAL HIGH (ref 65–99)
Glucose-Capillary: 132 mg/dL — ABNORMAL HIGH (ref 65–99)
Glucose-Capillary: 157 mg/dL — ABNORMAL HIGH (ref 65–99)
Glucose-Capillary: 159 mg/dL — ABNORMAL HIGH (ref 65–99)
Glucose-Capillary: 174 mg/dL — ABNORMAL HIGH (ref 65–99)
Glucose-Capillary: 186 mg/dL — ABNORMAL HIGH (ref 65–99)
Glucose-Capillary: 234 mg/dL — ABNORMAL HIGH (ref 65–99)
Glucose-Capillary: 254 mg/dL — ABNORMAL HIGH (ref 65–99)

## 2015-10-24 LAB — CBC
HCT: 37.7 % — ABNORMAL LOW (ref 40.0–52.0)
Hemoglobin: 12.8 g/dL — ABNORMAL LOW (ref 13.0–18.0)
MCH: 29.9 pg (ref 26.0–34.0)
MCHC: 34 g/dL (ref 32.0–36.0)
MCV: 88.2 fL (ref 80.0–100.0)
Platelets: 241 10*3/uL (ref 150–440)
RBC: 4.28 MIL/uL — ABNORMAL LOW (ref 4.40–5.90)
RDW: 13.1 % (ref 11.5–14.5)
WBC: 11.5 10*3/uL — ABNORMAL HIGH (ref 3.8–10.6)

## 2015-10-24 LAB — HEMOGLOBIN A1C: Hgb A1c MFr Bld: 11 % — ABNORMAL HIGH (ref 4.0–6.0)

## 2015-10-24 MED ORDER — INSULIN ASPART 100 UNIT/ML ~~LOC~~ SOLN
0.0000 [IU] | Freq: Three times a day (TID) | SUBCUTANEOUS | Status: DC
  Administered 2015-10-24: 2 [IU] via SUBCUTANEOUS
  Administered 2015-10-24 (×2): 3 [IU] via SUBCUTANEOUS
  Administered 2015-10-25: 15 [IU] via SUBCUTANEOUS
  Administered 2015-10-25: 3 [IU] via SUBCUTANEOUS
  Filled 2015-10-24 (×3): qty 3
  Filled 2015-10-24: qty 18
  Filled 2015-10-24: qty 2

## 2015-10-24 MED ORDER — ENOXAPARIN SODIUM 30 MG/0.3ML ~~LOC~~ SOLN
30.0000 mg | SUBCUTANEOUS | Status: DC
  Filled 2015-10-24: qty 0.3

## 2015-10-24 MED ORDER — INSULIN ASPART 100 UNIT/ML ~~LOC~~ SOLN
0.0000 [IU] | Freq: Every day | SUBCUTANEOUS | Status: DC
  Administered 2015-10-24: 2 [IU] via SUBCUTANEOUS
  Filled 2015-10-24: qty 2

## 2015-10-24 MED ORDER — INSULIN GLARGINE 100 UNIT/ML ~~LOC~~ SOLN
15.0000 [IU] | Freq: Every day | SUBCUTANEOUS | Status: DC
  Administered 2015-10-24 – 2015-10-25 (×2): 15 [IU] via SUBCUTANEOUS
  Filled 2015-10-24 (×3): qty 0.15

## 2015-10-24 MED ORDER — INSULIN ASPART 100 UNIT/ML ~~LOC~~ SOLN
3.0000 [IU] | Freq: Three times a day (TID) | SUBCUTANEOUS | Status: DC
Start: 2015-10-24 — End: 2015-10-25
  Administered 2015-10-24 – 2015-10-25 (×4): 3 [IU] via SUBCUTANEOUS
  Filled 2015-10-24 (×3): qty 3

## 2015-10-24 MED ORDER — INSULIN ASPART 100 UNIT/ML ~~LOC~~ SOLN: 5.0000 [IU] | Freq: Three times a day (TID) | SUBCUTANEOUS | Status: DC

## 2015-10-24 MED ORDER — MORPHINE SULFATE (PF) 2 MG/ML IV SOLN
0.5000 mg | INTRAVENOUS | Status: DC | PRN
  Administered 2015-10-24 – 2015-10-25 (×5): 0.5 mg via INTRAVENOUS
  Filled 2015-10-24 (×6): qty 1

## 2015-10-24 MED ORDER — POTASSIUM CHLORIDE 10 MEQ/100ML IV SOLN
10.0000 meq | INTRAVENOUS | Status: AC
  Administered 2015-10-24 (×3): 10 meq via INTRAVENOUS
  Filled 2015-10-24 (×4): qty 100

## 2015-10-24 NOTE — Progress Notes (Addendum)
Inpatient Diabetes Program Recommendations  AACE/ADA: New Consensus Statement on Inpatient Glycemic Control (2015)  Target Ranges:  Prepandial:   less than 140 mg/dL      Peak postprandial:   less than 180 mg/dL (1-2 hours)      Critically ill patients:  140 - 180 mg/dL  Results for RYLEND, PIETRZAK (MRN [id number]) as of 10/24/2015 07:28  Ref. Range 10/24/2015 00:31 10/24/2015 01:38 10/24/2015 03:03 10/24/2015 04:09 10/24/2015 05:45 10/24/2015 06:54  Glucose-Capillary Latest Ref Range: 65-99 mg/dL 409 (H) 811 (H) 914 (H) 157 (H) 107 (H) 110 (H)  Results for HAYDAN, MANSOURI (MRN [id number]) as of 10/24/2015 07:28  Ref. Range 10/23/2015 15:27  Glucose Latest Ref Range: 65-99 mg/dL 086 (HH)   Review of Glycemic Control  Diabetes history: DM1 Outpatient Diabetes medications: Lantus 20 units QHS, Novolog 5 units TID with meals Current orders for Inpatient glycemic control: Novolin R insulin drip per DKA protocol  Inpatient Diabetes Program Recommendations: Insulin - Basal: At time of transition from IV to SQ insulin, please consider ordering Lantus 15 units Q24H (based on 49 kg x 0.3 units). Correction (SSI): At time of transition from IV to SQ insulin, please consider ordering Novolog sensitive correction scale ACHS. Insulin - Meal Coverage: At time of transition from IV to SQ insulin, please consider ordering Novolog 3 units TID with meals for meal coverage (in addition to Novolog correction scale).  NOTE: NURSING: Per DKA protocol: RN to discontinue insulin drip 2 hours after subcutaneous basal insulin is given and simultaneously give Novolog correction scale.  ADDENDUM 10/24/15@14 :27-Spoke with patient about diabetes and home regimen for diabetes control. Patient reports that he is not being followed by a doctor at this time for diabetes management. Patient states that he has no insulin or testing supplies at home as he has ran out of insulin. Inquired about how patient has been getting insulin since last  hospitalization and he states that he comes to the hospital every time he runs out of insulin. Patient states that he was taking Lantus 20 units QHS and Novolog 5 units TID with meals as an outpatient for diabetes control when he had insulin. Patient reports that he is not checking his glucose at all because he does not have a glucometer or testing supplies at home. Patient was seen by Inpatient Diabetes team as well as Case Management during his last admission from 06/10/15 to 06/12/15. Patient was provided with information on the [facility] and [facility] during last admission. Inquired about follow up with [facility] and [facility] after discharge on 06/12/15 and patient stated that he has not went to either place since being discharged in November. Inquired about why he has not followed up with [facility] or [facility] and patient states that he doesn't know where either place is located. Printed off application with physical address for the [facility] and also contact and physical address information on the [facility] and gave them to the patient.   Discussed prior A1C results (13.1% on 06/11/15) and explained that his last A1C indicates an average glucose of 329 mg/dl. Informed patient that his current A1C has been drawn but is not resulted at this time.  Discussed glucose and A1C goals. Discussed importance of checking CBGs and maintaining good CBG control to prevent long-term and short-term complications. Explained how hyperglycemia leads to damage within blood vessels which lead to the common complications seen with uncontrolled diabetes. Stressed to the  patient the importance of improving glycemic control to prevent further complications from uncontrolled diabetes. Spoke with patient very frankly about increased risk of blindness, kidney failure, amputations, heart attack, and stroke if he does not get diabetes controlled. Asked  patient to go ahead an call [facility] and make an appointment for follow up and also to begin filling out the [facility] application. Encouraged patient to get a new glucometer and to check his glucose 4 times per day (before meals and at bedtime) and to keep a log book of glucose readings and insulin taken which he will need to take to doctor appointments. Explained how the doctor he follows up with can use the log book to continue to make insulin adjustments if needed. Patient verbalized understanding of information discussed and he states that he has no further questions at this time related to diabetes.  Thanks, Orlando PennerMarie Lynlee Stratton, RN, MSN, CDE Diabetes Coordinator Inpatient Diabetes Program [phone] (Team Pager from 8am to 5pm) [phone] (AP office) [phone] [facility] office) [phone] [facility] office)

## 2015-10-24 NOTE — Progress Notes (Signed)
Patient has been resting well throughout the night, only requiring PRN pain medication a few times.  Latest BMET called to MD (anion gap is closed and CO2 is normalizing). MD stated to leave drip rate at 1.9 and wait until the AM to transition off the insulin gtt.  Will continue to monitor.

## 2015-10-24 NOTE — Care Management (Signed)
Self-pay- per my previous assessment:  Trevor SiadAngela Ediberto Sens, RN Case Manager Signed CASE MANAGEMENT Care Management 06/12/2015 9:58 AM    Expand All Collapse All   Dx of diabetes at 25 y/o. Patient is well know for uncontrolled diabetes and ?noncompliance with meds. I spoke with him at length about following through with his responsibilities related to Rx and PCP. I found that [facility] is listed on his Medicaid Card but he has NO RX and PCP coverage- it is family coverage only but he does not have children. His mother used to take care of all his medical needs but she has passed away. He has a father named Trevor MarshallJimmy [phone]) and I have left message for him to call this RNCM to discuss transportation and follow up appointments. Patient appears clean. He has name brand clothes and shoes that appear new although those could have been given to him. He has a cell phone (listed on demographics) but states "the speakers are messed up and his father's number would be best to reach him". He does not indicate any difficulty with transportation. Medication mgt states that he had an appointment with them on 04/18/15 but missed it. They said "he has to get a PCP in order for them to keep assisting him". They has agreed to helping him this time. Case mgt has given him a glucometer- RN to instruct on use. I delivered his Rx for a new glucometer and his Rx have been faxed to med mgt. I have sent email to [facility] Trevor Brown to assist patient with Medical coverage/Medicaid. Patient advised to follow up with [facility], [facility], and [facility]- he agrees. I gave him my contact card to call with any outpatient questions. He was quiet and seemed upset during our conversation but denies any further questions. States "his arm pit hurts". I asked RN to assess prior to discharge. No further RNCM needs.

## 2015-10-24 NOTE — Progress Notes (Signed)
Report called to [name].  Patient to be transported to room 223.

## 2015-10-24 NOTE — Consult Note (Signed)
PHARMACIST - PHYSICIAN COMMUNICATION  CONCERNING:  Enoxaparin (Lovenox) for DVT Prophylaxis    RECOMMENDATION: Patient was prescribed enoxaprin 40mg  q24 hours for VTE prophylaxis. Patients weight is 49 kg. Based on [facility] policy patient is candidate for enoxaparin 30 mg q24 hour dosing based on male with weight <50kg.  DESCRIPTION: Pharmacy has adjusted enoxaparin dose per [facility] policy to enoxaparin 30mg  q 24 hours.   Cher NakaiSheema Adeeb Konecny, PharmD Pharmacy Resident 10/24/2015 12:59 PM

## 2015-10-24 NOTE — Plan of Care (Signed)
Problem: Pain Managment: Goal: General experience of comfort will improve Outcome: Not Progressing Pt is seeking pain medication frequently for his mouth pain.

## 2015-10-24 NOTE — Progress Notes (Signed)
eLink Physician-Brief Progress Note Patient Name: Trevor Brown DOB: 1990-10-05 MRN: [id number]   Date of Service  10/24/2015  HPI/Events of Note  k low dka   eICU Interventions  Dc bicarb supp k      Intervention Category Major Interventions: Electrolyte abnormality - evaluation and management  [name],[name]. 10/24/2015, 5:27 AM

## 2015-10-24 NOTE — Progress Notes (Signed)
MD paged regarding fluid orders. Ordered to D/C fluids since pt is eating and drinking and CBG's have normalized.

## 2015-10-24 NOTE — Clinical Social Work Note (Signed)
CSW consulted as well as RN CM consulted to address transportation concerns. RN CM has spoken with patient and patient has not indicated any issues with transportation. RN CM is following up with patient's father as well. Please reconsult CSW if indicated. York SpanielMonica Keeleigh Terris MSW,LCSW [phone]

## 2015-10-24 NOTE — Progress Notes (Signed)
[facility] Physicians - [place] at [facility]   PATIENT NAME: Trevor Brown    MR#:  [id number]  DATE OF BIRTH:  [birth year]  SUBJECTIVE:   Pt. Here due to acute DKA which has resolved now.  No N/V this a.m. Having severe back pain and wanting pain meds.  REVIEW OF SYSTEMS:    Review of Systems  Constitutional: Negative for fever, chills, weight loss, malaise/fatigue and diaphoresis.  HENT: Negative for congestion, ear discharge, ear pain, hearing loss, nosebleeds, sore throat and tinnitus.   Eyes: Negative for blurred vision, double vision and pain.  Respiratory: Negative for cough, hemoptysis, shortness of breath and wheezing.   Cardiovascular: Negative for chest pain, palpitations, orthopnea, leg swelling and PND.  Gastrointestinal: Negative for heartburn, nausea, vomiting, abdominal pain, diarrhea, constipation and blood in stool.  Genitourinary: Negative for dysuria, urgency, frequency and hematuria.  Musculoskeletal: Positive for back pain. Negative for myalgias.  Skin: Negative for itching and rash.  Neurological: Negative for dizziness, tingling, tremors, sensory change, focal weakness, seizures, weakness and headaches.  Psychiatric/Behavioral: Negative for depression. The patient is not nervous/anxious.   All other systems reviewed and are negative.   Nutrition: Carb control Tolerating Diet: Yes Tolerating PT: Ambulatory   DRUG ALLERGIES:   Allergies  Allergen Reactions  . Bee Venom Anaphylaxis  . Penicillins Anaphylaxis, Hives and Other (See Comments)    Has patient had a PCN reaction causing immediate rash, facial/tongue/throat swelling, SOB or lightheadedness with hypotension: Yes Has patient had a PCN reaction causing severe rash involving mucus membranes or skin necrosis: No Has patient had a PCN reaction that required hospitalization No Has patient had a PCN reaction occurring within the last 10 years: Yes If all of the above answers are "NO",  then may proceed with Cephalosporin use.  . Ibuprofen Other (See Comments)    Reaction:  GI upset   . Tramadol Hives and Swelling  . Vancomycin Rash and Other (See Comments)    Reaction:  Red man's syndrome     VITALS:  Blood pressure 131/74, pulse 78, temperature 98.3 F (36.8 C), temperature source Oral, resp. rate 17, height  (1.651 m), weight 49.3 kg (108 lb 11 oz), SpO2 93 %.  PHYSICAL EXAMINATION:   Physical Exam  Constitutional: He is oriented to person, place, and time and well-developed, well-nourished, and in no distress.  HENT:  Head: Normocephalic and atraumatic.  Eyes: Conjunctivae and EOM are normal. Pupils are equal, round, and reactive to light.  Neck: Normal range of motion. Neck supple. No tracheal deviation present. No thyromegaly present.  Cardiovascular: Normal rate, regular rhythm and normal heart sounds.   Pulmonary/Chest: Effort normal and breath sounds normal. No respiratory distress. He has no wheezes. He exhibits no tenderness.  Abdominal: Soft. Bowel sounds are normal. He exhibits no distension. There is no tenderness.  Musculoskeletal: Normal range of motion.  Neurological: He is alert and oriented to person, place, and time. No cranial nerve deficit.  Skin: Skin is warm and dry. No rash noted.  Psychiatric: Mood and affect normal.   LABORATORY PANEL:   CBC  Recent Labs Lab 10/24/15 0216  WBC 11.5*  HGB 12.8*  HCT 37.7*  PLT 241   ------------------------------------------------------------------------------------------------------------------  Chemistries   Recent Labs Lab 10/24/15 0405  NA 134*  K 3.1*  CL 109  CO2 20*  GLUCOSE 161*  BUN 17  CREATININE 0.77  CALCIUM 8.0*    ASSESSMENT AND PLAN:  25 year old male with history of type  1 diabetes, recent admission for DKA, hidradenitis who presents to the hospital with nausea vomiting and noted to be in acute diabetic ketoacidosis.  #1. Severe DKA with the evidence of severe  metabolic acidosis with pH 7.0, bicarbonate of 9:  - Off insulin drip as the anion gap closed,  - converted to long-acting insulin, NovoLog and SSI - DM nurse following  #2 nausea and vomiting secondary to DKA: Continue IV hydration, IV Zofran, IV PPIs.  #3 history of hepatitis C: Stable.  #4 severe hyperkalemia with EKG changes:  - resolved with changes  #5. leukocytosis is likely due to stress, had history of hydradenitis suppurative in the axilla: No evidence of infection at this time.  #3. smoking counseled to quit smoking for about 5 minutes. Offered nicotine patch.  Likely d/c home tomorrow. Transfer to floor    All the records are reviewed and case discussed with Care Management/Social Worker. Management plans discussed with the patient and he is in agreement.  CODE STATUS: Full  DVT Prophylaxis: Lovenox  TOTAL TIME TAKING CARE OF THIS PATIENT: 25 minutes.   POSSIBLE D/C IN AM, DEPENDING ON CLINICAL CONDITION. CM helping with prescription meds.   [facility], Herold Salguero M.D on 10/24/2015 at 8:49 AM  Between 7am to 6pm - Pager - [phone]  After 6pm go to [email or web address] - password [email or web address] [facility]  [place] [facility]  Office  [phone]  CC: Primary care physician; No PCP Per Patient

## 2015-10-24 NOTE — Progress Notes (Signed)
Pt was asking for additional pain medication. MD paged regarding morphine orders and orders were given to cut dose to 0.5mg . Upon returning to pt's room, pt was sound asleep snoring and did not hear me knock on the door or enter the room. Medication was wasted with Ree KidaJack, RN.

## 2015-10-24 NOTE — Progress Notes (Signed)
Pt came to floor at 1146. VSS. Pt was resting in bed. Oriented to room and phone for calls. Educated on how to place order for meals and to call Nurse and NT for assistance.

## 2015-10-25 LAB — GLUCOSE, CAPILLARY
Glucose-Capillary: 366 mg/dL — ABNORMAL HIGH (ref 65–99)
Glucose-Capillary: 403 mg/dL — ABNORMAL HIGH (ref 65–99)
Glucose-Capillary: 197 mg/dL — ABNORMAL HIGH (ref 65–99)

## 2015-10-25 MED ORDER — ENOXAPARIN SODIUM 40 MG/0.4ML ~~LOC~~ SOLN: 40.0000 mg | SUBCUTANEOUS | Status: DC

## 2015-10-25 MED ORDER — INSULIN ASPART 100 UNIT/ML ~~LOC~~ SOLN
8.0000 [IU] | Freq: Three times a day (TID) | SUBCUTANEOUS | Status: DC
Start: 2015-10-25 — End: 2015-10-25
  Administered 2015-10-25: 8 [IU] via SUBCUTANEOUS
  Filled 2015-10-25: qty 8

## 2015-10-25 MED ORDER — INSULIN ASPART 100 UNIT/ML ~~LOC~~ SOLN: SUBCUTANEOUS | Status: DC

## 2015-10-25 MED ORDER — INSULIN GLARGINE 100 UNIT/ML ~~LOC~~ SOLN: 20.0000 [IU] | Freq: Every day | SUBCUTANEOUS | Status: DC

## 2015-10-25 NOTE — Discharge Instructions (Signed)
Diabetic Ketoacidosis °Diabetic ketoacidosis is a life-threatening complication of diabetes. If it is not treated, it can cause severe dehydration and organ damage and can lead to a coma or death. °CAUSES °This condition develops when there is not enough of the hormone insulin in the body. Insulin helps the body to break down sugar for energy. Without insulin, the body cannot break down sugar, so it breaks down fats instead. This leads to the production of acids that are called ketones. Ketones are poisonous at high levels. °This condition can be triggered by: °· Stress on the body that is brought on by an illness. °· Medicines that raise blood glucose levels. °· Not taking diabetes medicine. °SYMPTOMS °Symptoms of this condition include: °· Fatigue. °· Weight loss. °· Excessive thirst. °· Light-headedness. °· Fruity or sweet-smelling breath. °· Excessive urination. °· Vision changes. °· Confusion or irritability. °· Nausea. °· Vomiting. °· Rapid breathing. °· Abdominal pain. °· Feeling flushed. °DIAGNOSIS °This condition is diagnosed based on a medical history, a physical exam, and blood tests. You may also have a urine test that checks for ketones. °TREATMENT °This condition may be treated with: °· Fluid replacement. This may be done to correct dehydration. °· Insulin injections. These may be given through the skin or through an IV tube. °· Electrolyte replacement. Electrolytes, such as potassium and sodium, may be given in pill form or through an IV tube. °· Antibiotic medicines. These may be prescribed if your condition was caused by an infection. °HOME CARE INSTRUCTIONS °Eating and Drinking °· Drink enough fluids to keep your urine clear or pale yellow. °· If you cannot eat, alternate between drinking fluids with sugar (such as juice) and salty fluids (such as broth or bouillon). °· If you can eat, follow your usual diet and drink sugar-free liquids, such as water. °Other Instructions °· Take insulin as  directed by your health care provider. Do not skip insulin injections. Do not use expired insulin. °· If your blood sugar is over 240 mg/dL, monitor your urine ketones every 4-6 hours. °· If you were prescribed an antibiotic medicine, finish all of it even if you start to feel better. °· Rest and exercise only as directed by your health care provider. °· If you get sick, call your health care provider and begin treatment quickly. Your body often needs extra insulin to fight an illness. °· Check your blood glucose levels regularly. If your blood glucose is high, drink plenty of fluids. This helps to flush out ketones. °SEEK MEDICAL CARE IF: °· Your blood glucose level is too high or too low. °· You have ketones in your urine. °· You have a fever. °· You cannot eat. °· You cannot tolerate fluids. °· You have been vomiting for more than 2 hours. °· You continue to have symptoms of this condition. °· You develop new symptoms. °SEEK IMMEDIATE MEDICAL CARE IF: °· Your blood glucose levels continue to be high (elevated). °· Your monitor reads "high" even when you are taking insulin. °· You faint. °· You have chest pain. °· You have trouble breathing. °· You have a sudden, severe headache. °· You have sudden weakness in one arm or one leg. °· You have sudden trouble speaking or swallowing. °· You have vomiting or diarrhea that gets worse after 3 hours. °· You feel severely fatigued. °· You have trouble thinking. °· You have abdominal pain. °· You are severely dehydrated. Symptoms of severe dehydration include: °¨ Extreme thirst. °¨ Dry mouth. °¨ Blue lips. °¨   Cold hands and feet. °¨ Rapid breathing. °  °This information is not intended to replace advice given to you by your health care provider. Make sure you discuss any questions you have with your health care provider. °  °Document Released: 07/05/2000 Document Revised: 11/22/2014 Document Reviewed: 06/15/2014 °Elsevier Interactive Patient Education ©2016 [employer]  Inc[employer] ° °

## 2015-10-25 NOTE — Care Management (Signed)
Provided patient application to [facility].  MD wrote script for novolog, lantus, and glucometer kit.  I have let a message for Medication management.  In the past they have not had Lantus to provide, but have had Toujeu to substitute. I have not yet received a return call from Medication Management.  Patient was instructed to take his prescriptions to Medication Management at the time of discharge, and complete both of the application. RNCM signing off

## 2015-10-25 NOTE — Plan of Care (Signed)
Problem: Safety: Goal: Ability to remain free from injury will improve Outcome: Progressing Pt independent, remaining free from falls this shift.  Problem: Fluid Volume: Goal: Ability to maintain a balanced intake and output will improve Outcome: Completed/Met Date Met:  10/25/15 Eating and drinking without difficulty.

## 2015-10-25 NOTE — Progress Notes (Signed)
Alert and oriented. VS's. No signs of acute distress.discharge instructions given. Patient verbalizes understanding.  Offered a wheelchair but patient refused .

## 2015-10-25 NOTE — Progress Notes (Signed)
Inpatient Diabetes Program Recommendations  AACE/ADA: New Consensus Statement on Inpatient Glycemic Control (2015)  Target Ranges:  Prepandial:   less than 140 mg/dL      Peak postprandial:   less than 180 mg/dL (1-2 hours)      Critically ill patients:  140 - 180 mg/dL  Results for Trevor Brown, [name] (MRN [id number]) as of 10/25/2015 07:45  Ref. Range 10/24/2015 07:35 10/24/2015 11:15 10/24/2015 16:31 10/24/2015 22:02 10/25/2015 07:39  Glucose-Capillary Latest Ref Range: 65-99 mg/dL 086132 (H) 578186 (H) 469174 (H) 234 (H) 403 (H)   Review of Glycemic Control  Diabetes history: DM1 Outpatient Diabetes medications: Lantus 20 units QHS, Novolog 5 units TID with meals Current orders for Inpatient glycemic control: Lantus 15 units daily, Novolog 0-15 units TID with meals, Novolog 0-5 units QHS, Novolog 3 units TID with meals  Inpatient Diabetes Program Recommendations: Insulin - Basal: Please consider increasing Lantus to 20 units daily. Correction (SSI): Please consider decreasing Novolog correction to sensitive scale. Insulin - Meal Coverage: Please consider increasing meal coverage to Novolog 6 units TID with meals for meal coverage.  Thanks, Orlando PennerMarie Kennard Fildes, RN, MSN, CDE Diabetes Coordinator Inpatient Diabetes Program [phone] (Team Pager from 8am to 5pm) [phone] ([facility] office) [phone] [facility] office) [phone] [facility] office)

## 2015-10-29 NOTE — Discharge Summary (Signed)
[facility] - [place] at [facility]   PATIENT NAME: Trevor Brown    MR#:  [id number]  DATE OF BIRTH:  [birth year]  DATE OF ADMISSION:  10/23/2015 ADMITTING PHYSICIAN: Katha Hamming, MD  DATE OF DISCHARGE: 10/25/2015  4:04 PM  PRIMARY CARE PHYSICIAN: No PCP Per Patient    ADMISSION DIAGNOSIS:  Diabetic ketoacidosis without coma associated with type 1 diabetes mellitus (HCC) [E10.10]  DISCHARGE DIAGNOSIS:  Active Problems:   DKA, type 1 (HCC)   SECONDARY DIAGNOSIS:   Past Medical History  Diagnosis Date  . Diabetes mellitus without complication (HCC)   . Hepatitis C, acute may 2016  . Heart attack (HCC)     Pt claims he had heart attack a year ago ( 2015)- and was admitted in [facility] for that, but not given any meds or angiogram, on review of chart- I could not find any details like that.    HOSPITAL COURSE:  26 year old male with history of type 1 diabetes, recent admission for DKA, hidradenitis who presents to the hospital with nausea vomiting and noted to be in acute diabetic ketoacidosis.  #1. Severe DKA with the evidence of severe metabolic acidosis: Resolved  #2 nausea and vomiting secondary to DKA: Resolved  #3 history of hepatitis C: Stable.  #4 severe hyperkalemia with EKG changes:  - resolved with changes  Was d/c home in stable condition. Although his k/h/o meds noncompliance he will be likely readmitted soon.  DISCHARGE CONDITIONS:   STABLE  CONSULTS OBTAINED:     DRUG ALLERGIES:   Allergies  Allergen Reactions  . Bee Venom Anaphylaxis  . Penicillins Anaphylaxis, Hives and Other (See Comments)    Has patient had a PCN reaction causing immediate rash, facial/tongue/throat swelling, SOB or lightheadedness with hypotension: Yes Has patient had a PCN reaction causing severe rash involving mucus membranes or skin necrosis: No Has patient had a PCN reaction that required hospitalization No Has patient had a PCN reaction  occurring within the last 10 years: Yes If all of the above answers are "NO", then may proceed with Cephalosporin use.  . Ibuprofen Other (See Comments)    Reaction:  GI upset   . Tramadol Hives and Swelling  . Vancomycin Rash and Other (See Comments)    Reaction:  Red man's syndrome     DISCHARGE MEDICATIONS:   Discharge Medication List as of 10/25/2015  2:49 PM    CONTINUE these medications which have CHANGED   Details  insulin aspart (NOVOLOG) 100 UNIT/ML injection 5 units three times daily with meals, Print    insulin glargine (LANTUS) 100 UNIT/ML injection Inject 0.2 mLs (20 Units total) into the skin at bedtime., Starting 10/25/2015, Until Discontinued, Print         DISCHARGE INSTRUCTIONS:    DIET:   diabetic diet  DISCHARGE CONDITION:  Good  ACTIVITY:  Activity as tolerated  OXYGEN:  Home Oxygen: No.   Oxygen Delivery: room air  DISCHARGE LOCATION:  home   If you experience worsening of your admission symptoms, develop shortness of breath, life threatening emergency, suicidal or homicidal thoughts you must seek medical attention immediately by calling 911 or calling your MD immediately  if symptoms less severe.  You Must read complete instructions/literature along with all the possible adverse reactions/side effects for all the Medicines you take and that have been prescribed to you. Take any new Medicines after you have completely understood and accpet all the possible adverse reactions/side effects.   Please note  You  were cared for by a hospitalist during your hospital stay. If you have any questions about your discharge medications or the care you received while you were in the hospital after you are discharged, you can call the unit and asked to speak with the hospitalist on call if the hospitalist that took care of you is not available. Once you are discharged, your primary care physician will handle any further medical issues. Please note that NO REFILLS  for any discharge medications will be authorized once you are discharged, as it is imperative that you return to your primary care physician (or establish a relationship with a primary care physician if you do not have one) for your aftercare needs so that they can reassess your need for medications and monitor your lab values.    On the day of Discharge:  VITAL SIGNS:  Blood pressure 128/61, pulse 73, temperature 98.4 F (36.9 C), temperature source Oral, resp. rate 19, height 5\' 5"  (1.651 m), weight 50.848 kg (112 lb 1.6 oz), SpO2 98 %. PHYSICAL EXAMINATION:  GENERAL:  25 y.o.-year-old patient lying in the bed with no acute distress.  EYES: Pupils equal, round, reactive to light and accommodation. No scleral icterus. Extraocular muscles intact.  HEENT: Head atraumatic, normocephalic. Oropharynx and nasopharynx clear.  NECK:  Supple, no jugular venous distention. No thyroid enlargement, no tenderness.  LUNGS: Normal breath sounds bilaterally, no wheezing, rales,rhonchi or crepitation. No use of accessory muscles of respiration.  CARDIOVASCULAR: S1, S2 normal. No murmurs, rubs, or gallops.  ABDOMEN: Soft, non-tender, non-distended. Bowel sounds present. No organomegaly or mass.  EXTREMITIES: No pedal edema, cyanosis, or clubbing.  NEUROLOGIC: Cranial nerves II through XII are intact. Muscle strength 5/5 in all extremities. Sensation intact. Gait not checked.  PSYCHIATRIC: The patient is alert and oriented x 3.  SKIN: No obvious rash, lesion, or ulcer.  DATA REVIEW:   CBC  Recent Labs Lab 10/24/15 0216  WBC 11.5*  HGB 12.8*  HCT 37.7*  PLT 241    Chemistries   Recent Labs Lab 10/24/15 1307  NA 130*  K 3.5  CL 104  CO2 20*  GLUCOSE 210*  BUN 17  CREATININE 0.66  CALCIUM 8.1*     Microbiology Results  Results for orders placed or performed during the hospital encounter of 10/23/15  MRSA PCR Screening     Status: Abnormal   Collection Time: 10/23/15  8:52 PM  Result  Value Ref Range Status   MRSA by PCR POSITIVE (A) NEGATIVE Final    Comment:        The GeneXpert MRSA Assay (FDA approved for NASAL specimens only), is one component of a comprehensive MRSA colonization surveillance program. It is not intended to diagnose MRSA infection nor to guide or monitor treatment for MRSA infections. CRITICAL RESULT CALLED TO, READ BACK BY AND VERIFIED WITH:  Melburn PopperMICHELLE WILLIAMS AT 2235 10/23/15 SDR     Management plans discussed with the patient, family and they are in agreement.  CODE STATUS: FULL CODE  TOTAL TIME TAKING CARE OF THIS PATIENT: 45 minutes.    [facility] Center[facility], Maricus Tanzi M.D on 10/29/2015 at 6:23 PM  Between 7am to 6pm - Pager - [phone]  After 6pm go to [email or web address] - password [email or web address] [facility]  [facility]  Office  [phone]  CC: Primary care physician; No PCP Per Patient   Note: This dictation was prepared with Dragon dictation along with smaller phrase technology. Any transcriptional errors that result from this process are unintentional.

## 2015-12-12 ENCOUNTER — Inpatient Hospital Stay
Admission: EM | Admit: 2015-12-12 | Discharge: 2015-12-14 | DRG: 638 | Disposition: A | Discharge status: Home / Self Care | Payer: Self-pay | Attending: Internal Medicine | Admitting: Internal Medicine

## 2015-12-12 ENCOUNTER — Encounter: Payer: Self-pay | Admitting: Emergency Medicine

## 2015-12-12 DIAGNOSIS — R7989 Other specified abnormal findings of blood chemistry: Secondary | ICD-10-CM

## 2015-12-12 DIAGNOSIS — Z7982 Long term (current) use of aspirin: Secondary | ICD-10-CM

## 2015-12-12 DIAGNOSIS — Z888 Allergy status to other drugs, medicaments and biological substances status: Secondary | ICD-10-CM

## 2015-12-12 DIAGNOSIS — Z79899 Other long term (current) drug therapy: Secondary | ICD-10-CM

## 2015-12-12 DIAGNOSIS — R079 Chest pain, unspecified: Secondary | ICD-10-CM

## 2015-12-12 DIAGNOSIS — K2900 Acute gastritis without bleeding: Secondary | ICD-10-CM | POA: Diagnosis present

## 2015-12-12 DIAGNOSIS — Z833 Family history of diabetes mellitus: Secondary | ICD-10-CM

## 2015-12-12 DIAGNOSIS — Z880 Allergy status to penicillin: Secondary | ICD-10-CM

## 2015-12-12 DIAGNOSIS — F17210 Nicotine dependence, cigarettes, uncomplicated: Secondary | ICD-10-CM | POA: Diagnosis present

## 2015-12-12 DIAGNOSIS — R778 Other specified abnormalities of plasma proteins: Secondary | ICD-10-CM

## 2015-12-12 DIAGNOSIS — I214 Non-ST elevation (NSTEMI) myocardial infarction: Secondary | ICD-10-CM

## 2015-12-12 DIAGNOSIS — E860 Dehydration: Secondary | ICD-10-CM | POA: Diagnosis present

## 2015-12-12 DIAGNOSIS — E1065 Type 1 diabetes mellitus with hyperglycemia: Secondary | ICD-10-CM

## 2015-12-12 DIAGNOSIS — F1410 Cocaine abuse, uncomplicated: Secondary | ICD-10-CM | POA: Diagnosis present

## 2015-12-12 DIAGNOSIS — Z794 Long term (current) use of insulin: Secondary | ICD-10-CM

## 2015-12-12 DIAGNOSIS — R112 Nausea with vomiting, unspecified: Secondary | ICD-10-CM

## 2015-12-12 DIAGNOSIS — R1013 Epigastric pain: Secondary | ICD-10-CM

## 2015-12-12 DIAGNOSIS — I2510 Atherosclerotic heart disease of native coronary artery without angina pectoris: Secondary | ICD-10-CM | POA: Diagnosis present

## 2015-12-12 DIAGNOSIS — E871 Hypo-osmolality and hyponatremia: Secondary | ICD-10-CM | POA: Diagnosis present

## 2015-12-12 DIAGNOSIS — B1920 Unspecified viral hepatitis C without hepatic coma: Secondary | ICD-10-CM | POA: Diagnosis present

## 2015-12-12 DIAGNOSIS — I252 Old myocardial infarction: Secondary | ICD-10-CM

## 2015-12-12 DIAGNOSIS — E1010 Type 1 diabetes mellitus with ketoacidosis without coma: Principal | ICD-10-CM | POA: Diagnosis present

## 2015-12-12 DIAGNOSIS — E876 Hypokalemia: Secondary | ICD-10-CM | POA: Diagnosis present

## 2015-12-12 DIAGNOSIS — R748 Abnormal levels of other serum enzymes: Secondary | ICD-10-CM | POA: Diagnosis present

## 2015-12-12 LAB — URINE DRUG SCREEN, QUALITATIVE (ARMC ONLY)
Amphetamines, Ur Screen: NOT DETECTED
Barbiturates, Ur Screen: NOT DETECTED
Benzodiazepine, Ur Scrn: NOT DETECTED
Cannabinoid 50 Ng, Ur ~~LOC~~: NOT DETECTED
Cocaine Metabolite,Ur ~~LOC~~: DETECTED — AB
MDMA (Ecstasy)Ur Screen: NOT DETECTED
Methadone Scn, Ur: NOT DETECTED
Opiate, Ur Screen: DETECTED — AB
Phencyclidine (PCP) Ur S: NOT DETECTED
Tricyclic, Ur Screen: NOT DETECTED

## 2015-12-12 LAB — TROPONIN I: Troponin I: 0.09 ng/mL — ABNORMAL HIGH (ref <0.031)

## 2015-12-12 LAB — GLUCOSE, CAPILLARY
Glucose-Capillary: 145 mg/dL — ABNORMAL HIGH (ref 65–99)
Glucose-Capillary: 230 mg/dL — ABNORMAL HIGH (ref 65–99)
Glucose-Capillary: 275 mg/dL — ABNORMAL HIGH (ref 65–99)
Glucose-Capillary: 316 mg/dL — ABNORMAL HIGH (ref 65–99)
Glucose-Capillary: 372 mg/dL — ABNORMAL HIGH (ref 65–99)

## 2015-12-12 LAB — BASIC METABOLIC PANEL
Anion gap: 14 (ref 5–15)
BUN: 19 mg/dL (ref 6–20)
CO2: 16 mmol/L — ABNORMAL LOW (ref 22–32)
Calcium: 8.2 mg/dL — ABNORMAL LOW (ref 8.9–10.3)
Chloride: 106 mmol/L (ref 101–111)
Creatinine, Ser: 0.87 mg/dL (ref 0.61–1.24)
GFR calc Af Amer: >60 mL/min (ref >60)
GFR calc non Af Amer: >60 mL/min (ref >60)
Glucose, Bld: 239 mg/dL — ABNORMAL HIGH (ref 65–99)
Potassium: 4.2 mmol/L (ref 3.5–5.1)
Sodium: 136 mmol/L (ref 135–145)

## 2015-12-12 LAB — URINALYSIS COMPLETE WITH MICROSCOPIC (ARMC ONLY)
Bacteria, UA: NONE SEEN
Bilirubin Urine: NEGATIVE
Glucose, UA: >500 mg/dL — AB
Hgb urine dipstick: NEGATIVE
Leukocytes, UA: NEGATIVE
Nitrite: NEGATIVE
Protein, ur: NEGATIVE mg/dL
RBC / HPF: NONE SEEN RBC/hpf (ref 0–5)
Specific Gravity, Urine: 1.028 (ref 1.005–1.030)
Squamous Epithelial / LPF: NONE SEEN
pH: 5 (ref 5.0–8.0)

## 2015-12-12 LAB — COMPREHENSIVE METABOLIC PANEL
ALT: 55 U/L (ref 17–63)
AST: 42 U/L — ABNORMAL HIGH (ref 15–41)
Albumin: 4.8 g/dL (ref 3.5–5.0)
Alkaline Phosphatase: 168 U/L — ABNORMAL HIGH (ref 38–126)
Anion gap: 22 — ABNORMAL HIGH (ref 5–15)
BUN: 20 mg/dL (ref 6–20)
CO2: 15 mmol/L — ABNORMAL LOW (ref 22–32)
Calcium: 9.5 mg/dL (ref 8.9–10.3)
Chloride: 96 mmol/L — ABNORMAL LOW (ref 101–111)
Creatinine, Ser: 1.01 mg/dL (ref 0.61–1.24)
GFR calc Af Amer: >60 mL/min (ref >60)
GFR calc non Af Amer: >60 mL/min (ref >60)
Glucose, Bld: 365 mg/dL — ABNORMAL HIGH (ref 65–99)
Potassium: 4.5 mmol/L (ref 3.5–5.1)
Sodium: 133 mmol/L — ABNORMAL LOW (ref 135–145)
Total Bilirubin: 1.4 mg/dL — ABNORMAL HIGH (ref 0.3–1.2)
Total Protein: 8.4 g/dL — ABNORMAL HIGH (ref 6.5–8.1)

## 2015-12-12 LAB — CBC
HCT: 48.4 % (ref 40.0–52.0)
HCT: 40.5 % (ref 40.0–52.0)
Hemoglobin: 16.4 g/dL (ref 13.0–18.0)
Hemoglobin: 13.6 g/dL (ref 13.0–18.0)
MCH: 30.2 pg (ref 26.0–34.0)
MCH: 29.7 pg (ref 26.0–34.0)
MCHC: 33.8 g/dL (ref 32.0–36.0)
MCHC: 33.6 g/dL (ref 32.0–36.0)
MCV: 89.4 fL (ref 80.0–100.0)
MCV: 88.6 fL (ref 80.0–100.0)
Platelets: 245 10*3/uL (ref 150–440)
Platelets: 219 10*3/uL (ref 150–440)
RBC: 5.42 MIL/uL (ref 4.40–5.90)
RBC: 4.57 MIL/uL (ref 4.40–5.90)
RDW: 12.8 % (ref 11.5–14.5)
RDW: 12.7 % (ref 11.5–14.5)
WBC: 10.1 10*3/uL (ref 3.8–10.6)
WBC: 9 10*3/uL (ref 3.8–10.6)

## 2015-12-12 LAB — LACTIC ACID, PLASMA: Lactic Acid, Venous: 1 mmol/L (ref 0.5–2.0)

## 2015-12-12 LAB — LIPASE, BLOOD: Lipase: 18 U/L (ref 11–51)

## 2015-12-12 LAB — BETA-HYDROXYBUTYRIC ACID: Beta-Hydroxybutyric Acid: 5.8 mmol/L — ABNORMAL HIGH (ref 0.05–0.27)

## 2015-12-12 LAB — MRSA PCR SCREENING: MRSA by PCR: NEGATIVE

## 2015-12-12 LAB — MAGNESIUM: Magnesium: 1.6 mg/dL — ABNORMAL LOW (ref 1.7–2.4)

## 2015-12-12 MED ORDER — SODIUM CHLORIDE 0.9 % IV BOLUS (SEPSIS)
1000.0000 mL | Freq: Once | INTRAVENOUS | Status: AC
  Administered 2015-12-12: 1000 mL via INTRAVENOUS

## 2015-12-12 MED ORDER — ONDANSETRON 4 MG PO TBDP
4.0000 mg | ORAL_TABLET | Freq: Once | ORAL | Status: AC | PRN
  Administered 2015-12-12: 4 mg via ORAL

## 2015-12-12 MED ORDER — HYDROMORPHONE HCL 1 MG/ML IJ SOLN
0.5000 mg | Freq: Once | INTRAMUSCULAR | Status: AC
  Administered 2015-12-12: 0.5 mg via INTRAVENOUS
  Filled 2015-12-12: qty 1

## 2015-12-12 MED ORDER — ASPIRIN EC 325 MG PO TBEC
325.0000 mg | DELAYED_RELEASE_TABLET | Freq: Once | ORAL | Status: AC
  Administered 2015-12-12: 325 mg via ORAL
  Filled 2015-12-12: qty 1

## 2015-12-12 MED ORDER — ASPIRIN EC 81 MG PO TBEC
81.0000 mg | DELAYED_RELEASE_TABLET | Freq: Every day | ORAL | Status: DC
  Administered 2015-12-14: 81 mg via ORAL
  Filled 2015-12-12 (×3): qty 1

## 2015-12-12 MED ORDER — ACETAMINOPHEN 325 MG PO TABS: 650.0000 mg | ORAL_TABLET | Freq: Four times a day (QID) | ORAL | Status: DC | PRN

## 2015-12-12 MED ORDER — NICOTINE 21 MG/24HR TD PT24
21.0000 mg | MEDICATED_PATCH | Freq: Every day | TRANSDERMAL | Status: DC
  Administered 2015-12-12 – 2015-12-14 (×3): 21 mg via TRANSDERMAL
  Filled 2015-12-12 (×2): qty 1

## 2015-12-12 MED ORDER — SODIUM CHLORIDE 0.9 % IV SOLN
INTRAVENOUS | Status: DC
  Administered 2015-12-12: 21:00:00 via INTRAVENOUS
  Filled 2015-12-12: qty 2.5

## 2015-12-12 MED ORDER — ACETAMINOPHEN 650 MG RE SUPP: 650.0000 mg | Freq: Four times a day (QID) | RECTAL | Status: DC | PRN

## 2015-12-12 MED ORDER — ONDANSETRON HCL 4 MG PO TABS: 4.0000 mg | ORAL_TABLET | Freq: Four times a day (QID) | ORAL | Status: DC | PRN

## 2015-12-12 MED ORDER — ENOXAPARIN SODIUM 40 MG/0.4ML ~~LOC~~ SOLN
40.0000 mg | SUBCUTANEOUS | Status: DC
  Administered 2015-12-12 – 2015-12-13 (×2): 40 mg via SUBCUTANEOUS
  Filled 2015-12-12 (×2): qty 0.4

## 2015-12-12 MED ORDER — SODIUM CHLORIDE 0.9% FLUSH
3.0000 mL | Freq: Two times a day (BID) | INTRAVENOUS | Status: DC
  Administered 2015-12-12 – 2015-12-14 (×4): 3 mL via INTRAVENOUS

## 2015-12-12 MED ORDER — SODIUM CHLORIDE 0.9 % IV SOLN
INTRAVENOUS | Status: DC
  Administered 2015-12-12: 21:00:00 via INTRAVENOUS

## 2015-12-12 MED ORDER — PANTOPRAZOLE SODIUM 40 MG IV SOLR
40.0000 mg | Freq: Two times a day (BID) | INTRAVENOUS | Status: DC
  Administered 2015-12-12 – 2015-12-13 (×2): 40 mg via INTRAVENOUS
  Filled 2015-12-12 (×2): qty 40

## 2015-12-12 MED ORDER — ONDANSETRON 4 MG PO TBDP
ORAL_TABLET | ORAL | Status: AC
  Administered 2015-12-12: 4 mg via ORAL
  Filled 2015-12-12: qty 1

## 2015-12-12 MED ORDER — SUCRALFATE 1 GM/10ML PO SUSP
1.0000 g | ORAL | Status: DC
  Administered 2015-12-12 – 2015-12-14 (×9): 1 g via ORAL
  Filled 2015-12-12 (×12): qty 10

## 2015-12-12 MED ORDER — NICOTINE POLACRILEX 2 MG MT GUM
2.0000 mg | CHEWING_GUM | OROMUCOSAL | Status: DC | PRN
  Administered 2015-12-12: 2 mg via ORAL
  Filled 2015-12-12 (×3): qty 1

## 2015-12-12 MED ORDER — DIAZEPAM 5 MG/ML IJ SOLN: 5.0000 mg | Freq: Four times a day (QID) | INTRAMUSCULAR | Status: DC | PRN

## 2015-12-12 MED ORDER — NICOTINE 21 MG/24HR TD PT24
MEDICATED_PATCH | TRANSDERMAL | Status: AC
  Filled 2015-12-12: qty 1

## 2015-12-12 MED ORDER — DEXTROSE-NACL 5-0.45 % IV SOLN
INTRAVENOUS | Status: DC
  Administered 2015-12-13: 75 mL via INTRAVENOUS

## 2015-12-12 MED ORDER — POTASSIUM CHLORIDE 10 MEQ/100ML IV SOLN
10.0000 meq | INTRAVENOUS | Status: AC
  Administered 2015-12-12: 10 meq via INTRAVENOUS
  Filled 2015-12-12 (×2): qty 100

## 2015-12-12 MED ORDER — ONDANSETRON HCL 4 MG/2ML IJ SOLN
4.0000 mg | Freq: Once | INTRAMUSCULAR | Status: AC
  Administered 2015-12-12: 4 mg via INTRAVENOUS
  Filled 2015-12-12: qty 2

## 2015-12-12 MED ORDER — ONDANSETRON HCL 4 MG/2ML IJ SOLN
4.0000 mg | Freq: Four times a day (QID) | INTRAMUSCULAR | Status: DC | PRN
Start: 2015-12-12 — End: 2015-12-13
  Administered 2015-12-13: 4 mg via INTRAVENOUS
  Filled 2015-12-12 (×2): qty 2

## 2015-12-12 MED ORDER — SODIUM CHLORIDE 0.9 % IV SOLN
INTRAVENOUS | Status: AC
  Administered 2015-12-12: 19:00:00 via INTRAVENOUS

## 2015-12-12 MED ORDER — ONDANSETRON HCL 4 MG/2ML IJ SOLN
INTRAMUSCULAR | Status: AC
  Administered 2015-12-12: 4 mg
  Filled 2015-12-12: qty 2

## 2015-12-12 MED ORDER — MORPHINE SULFATE (PF) 2 MG/ML IV SOLN
2.0000 mg | INTRAVENOUS | Status: DC | PRN
  Administered 2015-12-12 – 2015-12-14 (×9): 2 mg via INTRAVENOUS
  Filled 2015-12-12 (×9): qty 1

## 2015-12-12 MED ORDER — INSULIN ASPART 100 UNIT/ML ~~LOC~~ SOLN
10.0000 [IU] | Freq: Once | SUBCUTANEOUS | Status: AC
  Administered 2015-12-12: 10 [IU] via INTRAVENOUS
  Filled 2015-12-12: qty 10

## 2015-12-12 NOTE — ED Provider Notes (Addendum)
[facility] Emergency Department Provider Note  ____________________________________________  Time seen: Approximately 3:30 PM  I have reviewed the triage vital signs and the nursing notes.   HISTORY  Chief Complaint Hyperglycemia    HPI Trevor Brown is a 25 y.o. male the history of DM 1, recent cocaine abuse, CAD s/p MI, presenting with nausea and vomiting, epigastric pain, and chest pain. The patient reports that he stopped taking his insulin approximately one week ago. Shortly thereafter, he began to have nausea and vomiting and now is unable to tolerate any by mouth. After multiple days of vomiting, he began to have an epigastric discomfort. He is also been having a central chest "burning" that is constant and started after cocaine use 4 days ago.He denies any fever, chills, diarrhea or constipation. He does have polyuria and polydipsia.   Past Medical History  Diagnosis Date  . Diabetes mellitus without complication (HCC)   . Hepatitis C, acute may 2016  . Heart attack (HCC)     Pt claims he had heart attack a year ago ( 2015)- and [facility], but not given any meds or angiogram, on review of chart- I could not find any details like that.    Patient Active Problem List   Diagnosis Date Noted  . DKA (diabetic ketoacidoses) (HCC) 06/10/2015  . Hidradenitis suppurativa of left axilla   . DKA, type 1 (HCC) 06/06/2015  . Malnutrition of moderate degree (HCC) 04/08/2015  . Hepatitis C 12/13/2014  . Diabetes type 1, uncontrolled (HCC) 12/11/2014  . Major depressive disorder, single episode, mild (HCC)   . Major depression, single episode 12/10/2014  . Diabetes mellitus type 1, uncontrolled (HCC) 12/09/2014  . Transaminitis 12/09/2014  . Folliculitis 12/09/2014  . Homelessness 12/09/2014    History reviewed. No pertinent past surgical history.  Current Outpatient Rx  Name  Route  Sig  Dispense  Refill  . insulin aspart (NOVOLOG)  100 UNIT/ML injection      5 units three times daily with meals   10 mL   3   . insulin glargine (LANTUS) 100 UNIT/ML injection   Subcutaneous   Inject 0.2 mLs (20 Units total) into the skin at bedtime.   10 mL   11     Allergies Bee venom; Penicillins; Ibuprofen; Tramadol; and Vancomycin  Family History  Problem Relation Age of Onset  . Cirrhosis Mother   . Diabetes Mellitus II Maternal Grandmother     Social History Social History  Substance Use Topics  . Smoking status: Current Every Day Smoker -- 1.50 packs/day    Types: Cigarettes  . Smokeless tobacco: None  . Alcohol Use: No    Review of Systems Constitutional: No fever/chills.No lightheadedness or syncope. Positive general malaise and weakness. Positive polydipsia. Eyes: No visual changes. No eye discharge. ENT: No sore throat. No congestion or rhinorrhea. Cardiovascular: Denies chest pain. Denies palpitations. Respiratory: Denies shortness of breath.  No cough. Gastrointestinal: Positive epigastric abdominal pain.  Positive nausea, positive vomiting.  No diarrhea.  No constipation. Genitourinary: Negative for dysuria. Positive polyuria Musculoskeletal: Negative for back pain. Skin: Negative for rash. Neurological: Negative for headaches. No focal numbness, tingling or weakness.   10-point ROS otherwise negative.  ____________________________________________   PHYSICAL EXAM:  VITAL SIGNS: ED Triage Vitals  Enc Vitals Group     BP 12/12/15 1502 124/66 mmHg     Pulse Rate 12/12/15 1502 66     Resp 12/12/15 1502 18  Temp 12/12/15 1502 98.1 F (36.7 C)     Temp Source 12/12/15 1502 Oral     SpO2 12/12/15 1502 100 %     Weight 12/12/15 1502 120 lb (54.432 kg)     Height 12/12/15 1502 5\' 4"  (1.626 m)     Head Cir --      Peak Flow --      Pain Score 12/12/15 1503 10     Pain Loc --      Pain Edu? --      Excl. in GC? --     Constitutional: Patient is alert and oriented and able to answer  questions appropriately. He is uncomfortable appearing but nontoxic.  Eyes: Conjunctivae are normal.  EOMI. No scleral icterus. Head: Atraumatic. Nose: No congestion/rhinnorhea. Mouth/Throat: Mucous membranes are dry. Diffuse poor dentition. Neck: No stridor.  Supple.  No JVD. No meningismus. Cardiovascular: Normal rate, regular rhythm. No murmurs, rubs or gallops.  Respiratory: Normal respiratory effort.  No accessory muscle use or retractions. Lungs CTAB.  No wheezes, rales or ronchi. Gastrointestinal: Soft and nondistended.  Minimal tenderness to palpation in the epigastrium. No Murphy sign. No guarding or rebound.  No peritoneal signs. Musculoskeletal: No LE edema. No ttp in the calves or palpable cords.  Negative Homan's sign. Neurologic:  A&Ox3.  Speech is clear.  Face and smile are symmetric.  EOMI.  Moves all extremities well. Skin:  Skin is warm, dry and intact. No rash noted. Psychiatric: Depressed mood and flat affect.  ____________________________________________   LABS (all labs ordered are listed, but only abnormal results are displayed)  Labs Reviewed  GLUCOSE, CAPILLARY - Abnormal; Notable for the following:    Glucose-Capillary 372 (*)    All other components within normal limits  BLOOD GAS, VENOUS - Abnormal; Notable for the following:    pH, Ven 7.23 (*)    pCO2, Ven 37 (*)    All other components within normal limits  TROPONIN I - Abnormal; Notable for the following:    Troponin I 0.09 (*)    All other components within normal limits  CBC  URINALYSIS COMPLETEWITH MICROSCOPIC ([facility] ONLY)  COMPREHENSIVE METABOLIC PANEL  URINE DRUG SCREEN, QUALITATIVE ([facility] ONLY)  LIPASE, BLOOD  CBG MONITORING, ED   ____________________________________________  EKG  ED ECG REPORT I, Rockne Menghini, the attending physician, personally viewed and interpreted this ECG.   Date: 12/12/2015  EKG Time: 1514  Rate: 67  Rhythm: normal sinus rhythm  Axis: Normal   Intervals:none  ST&T Change: Nonspecific T-wave inversions in V1, V2 and V3. No ST elevation.  The patient has had these T-wave inversions on two previous EKGs when compared.  ____________________________________________  RADIOLOGY  No results found.  ____________________________________________   PROCEDURES  Procedure(s) performed: None  Critical Care performed: YES ____________________________________________   INITIAL IMPRESSION / ASSESSMENT AND PLAN / ED COURSE  Pertinent labs & imaging results that were available during my care of the patient were reviewed by me and considered in my medical decision making (see chart for details).  25 y.o. male with DM 1, CAD status post MI, and recent cocaine use presenting with medication noncompliance, nausea and vomiting, and chest pain. Overall, the patient has stable vital signs but looks like he does not feel well. He does appear dehydrated on my exam. I will evaluate him for DKA, but he will also need evaluation for ACS or MI given his recent cocaine use and chest pain. I will look for any electrolyte disturbances, and initiate  symptomatic treatment to improve his symptoms.  ----------------------------------------- 4:46 PM on 12/12/2015 -----------------------------------------  I reevaluated the patient, who is no longer having any chest pain. He is continuing to have some abdominal discomfort. He has received his first bag of IV fluid and I'm awaiting his laboratory studies. He does have a positive troponin today, so I have given him aspirin and he will need to have this trended.  CRITICAL CARE Performed by: Rockne Menghini   Total critical care time: 45 minutes  Critical care time was exclusive of separately billable procedures and treating other patients.  Critical care was necessary to treat or prevent imminent or life-threatening deterioration.  Critical care was time spent personally by me on the following  activities: development of treatment plan with patient and/or surrogate as well as nursing, discussions with consultants, evaluation of patient's response to treatment, examination of patient, obtaining history from patient or surrogate, ordering and performing treatments and interventions, ordering and review of laboratory studies, ordering and review of radiographic studies, pulse oximetry and re-evaluation of patient's condition.   ____________________________________________  FINAL CLINICAL IMPRESSION(S) / ED DIAGNOSES  Final diagnoses:  NSTEMI (non-ST elevated myocardial infarction) (HCC)  Non-intractable vomiting with nausea, vomiting of unspecified type  Epigastric pain  Cocaine abuse      NEW MEDICATIONS STARTED DURING THIS VISIT:  New Prescriptions   No medications on file     Rockne Menghini, MD 12/12/15 1538  Rockne Menghini, MD 12/12/15 1710  Rockne Menghini, MD 12/12/15 [id number]

## 2015-12-12 NOTE — ED Notes (Signed)
2 unsuccessful PIV attempts by this RN. Carollee HerterShannon, primary RN, made aware.

## 2015-12-12 NOTE — ED Notes (Signed)
Lab called with troponin of .09; Dr. Sharma CovertNorman notified.

## 2015-12-12 NOTE — ED Notes (Signed)
Patient presents to the ED with abdominal pain, chest pain, nausea and vomiting.  Patient is a type 1 diabetic and states he has not checked his blood sugar in about 1 week, states he does not have a meter.  Patient also has not taken his insulin in about 1 week as well.  Patient reports history of drug use, denies recent drug use.  Patient has a history of an MI.  Patient appears very tired.

## 2015-12-12 NOTE — H&P (Signed)
[facility] [place] [facility]   PATIENT NAME: Trevor Knackony Hirst    MR#:  [id number]  DATE OF BIRTH:  1990/07/23  DATE OF ADMISSION:  12/12/2015  PRIMARY CARE PHYSICIAN: No PCP Per Patient   REQUESTING/REFERRING PHYSICIAN:   CHIEF COMPLAINT:   Chief Complaint  Patient presents with  . Hyperglycemia    HISTORY OF PRESENT ILLNESS: Trevor Brown  is a 25 y.o. male with a known history of His mellitus type I, hepatitis C, coronary artery disease, who presents to the hospital with complaints of nausea, vomiting, epigastric and chest pain, hyperglycemia. Currently, patient ran out of his insulin about a week ago and 50 days ago he started having intermittent nausea and vomiting, epigastric abdominal pain as well as chest pain. His blood glucose level was registering close to 400 today, he felt poorly and decided to come to emergency room for further evaluation. Chest pain is described as low sternal area chest pain, sharp, intermittent, with no alleviating or aggravating factors of is no significant change whenever he walked around, however, increases with deep breathing or eating or drinking. He wasn't able to keep food down. On arrival to the hospital patient was noted to be in DKA and hospitalist services were contacted for admission  PAST MEDICAL HISTORY:   Past Medical History  Diagnosis Date  . Diabetes mellitus without complication (HCC)   . Hepatitis C, acute may 2016  . Heart attack (HCC)     Pt claims he had heart attack a year ago ( 2015)- and was [facility] for that, but not given any meds or angiogram, on review of chart- I could not find any details like that.    PAST SURGICAL HISTORY: History reviewed. No pertinent past surgical history.  SOCIAL HISTORY:  Social History  Substance Use Topics  . Smoking status: Current Every Day Smoker -- 1.50 packs/day    Types: Cigarettes  . Smokeless tobacco: Not on file  . Alcohol Use: No    FAMILY  HISTORY:  Family History  Problem Relation Age of Onset  . Cirrhosis Mother   . Diabetes Mellitus II Maternal Grandmother     DRUG ALLERGIES:  Allergies  Allergen Reactions  . Bee Venom Anaphylaxis  . Penicillins Anaphylaxis, Hives and Other (See Comments)    Has patient had a PCN reaction causing immediate rash, facial/tongue/throat swelling, SOB or lightheadedness with hypotension: Yes Has patient had a PCN reaction causing severe rash involving mucus membranes or skin necrosis: No Has patient had a PCN reaction that required hospitalization No Has patient had a PCN reaction occurring within the last 10 years: Yes If all of the above answers are "NO", then may proceed with Cephalosporin use.  . Ibuprofen Other (See Comments)    Reaction:  GI upset   . Tramadol Hives and Swelling  . Vancomycin Rash and Other (See Comments)    Reaction:  Red man's syndrome     Review of Systems  Constitutional: Positive for fever, chills, weight loss and malaise/fatigue.  HENT: Negative for congestion.   Eyes: Positive for blurred vision. Negative for double vision.  Respiratory: Negative for cough, sputum production, shortness of breath and wheezing.   Cardiovascular: Positive for chest pain and palpitations. Negative for orthopnea, leg swelling and PND.  Gastrointestinal: Positive for nausea, vomiting and abdominal pain. Negative for diarrhea, constipation, blood in stool and melena.  Genitourinary: Positive for dysuria. Negative for urgency, frequency and hematuria.  Musculoskeletal: Positive for myalgias. Negative  for falls.  Skin: Negative for rash.  Neurological: Positive for weakness. Negative for dizziness.  Psychiatric/Behavioral: Negative for depression and memory loss. The patient is not nervous/anxious.     MEDICATIONS AT HOME:  Prior to Admission medications   Medication Sig Start Date End Date Taking? Authorizing Provider  insulin aspart (NOVOLOG) 100 UNIT/ML injection Inject 5  Units into the skin 3 (three) times daily with meals.   Yes Historical Provider, MD  insulin glargine (LANTUS) 100 UNIT/ML injection Inject 0.2 mLs (20 Units total) into the skin at bedtime. 10/25/15  Yes Delfino Lovett, MD      PHYSICAL EXAMINATION:   VITAL SIGNS: Blood pressure 119/72, pulse 64, temperature 98.1 F (36.7 C), temperature source Oral, resp. rate 17, height  (1.626 m), weight 54.432 kg (120 lb), SpO2 100 %.  GENERAL:  25 y.o.-year-old patient lying in the bed In mild to moderate distress due to abdominal discomfort, patient is resting with his eyes closed, on this questions with no eye contact initially.  EYES: Pupils equal, round, reactive to light and accommodation. No scleral icterus. Extraocular muscles intact.  HEENT: Head atraumatic, normocephalic. Oropharynx and nasopharynx clear. Dry oral mucosa NECK:  Supple, no jugular venous distention. No thyroid enlargement, no tenderness.  LUNGS: Some diminished breath sounds bilaterally, no wheezing, rales,rhonchi or crepitation. No use of accessory muscles of respiration.  CARDIOVASCULAR: S1, S2 normal. No murmurs, rubs, or gallops. Chest is tender to palpation in the lower sternal area, however, patient states that this pain is different from the pain he had before arrival to the hospital ABDOMEN: Soft, tender diffusely, mostly in epigastric area with some voluntary guarding, no rebound, nondistended. Bowel sounds present. No organomegaly or mass.  EXTREMITIES: No pedal edema, cyanosis, or clubbing.  NEUROLOGIC: Cranial nerves II through XII are intact. Muscle strength 5/5 in all extremities. Sensation intact. Gait not checked.  PSYCHIATRIC: The patient is alert and oriented x 3. Poor eye contact initially SKIN: No obvious rash, lesion, or ulcer.   LABORATORY PANEL:   CBC  Recent Labs Lab 12/12/15 1538  WBC 10.1  HGB 16.4  HCT 48.4  PLT 245  MCV 89.4  MCH 30.2  MCHC 33.8  RDW 12.8    ------------------------------------------------------------------------------------------------------------------  Chemistries   Recent Labs Lab 12/12/15 1530  NA 133*  K 4.5  CL 96*  CO2 15*  GLUCOSE 365*  BUN 20  CREATININE 1.01  CALCIUM 9.5  AST 42*  ALT 55  ALKPHOS 168*  BILITOT 1.4*   ------------------------------------------------------------------------------------------------------------------  Cardiac Enzymes  Recent Labs Lab 12/12/15 1530  TROPONINI 0.09*   ------------------------------------------------------------------------------------------------------------------  RADIOLOGY: No results found.  EKG: Orders placed or performed during the hospital encounter of 12/12/15  . EKG 12-Lead  . EKG 12-Lead  . EKG 12-Lead  . EKG 12-Lead  EKG in the emergency room revealed sinus rhythm at 67 bpm with short PR interval, no acute ST-T changes  IMPRESSION AND PLAN:  Principal Problem:   DKA, type 1 (HCC) Active Problems:   Chest pain   Hyponatremia   Elevated troponin   Cocaine abuse   Type 1 diabetes mellitus with hyperglycemia (HCC) #1 DKA, type I, admit patient to medical floor. Initiate him on insulin IV drip, IV fluids, follow patient's BMP every 4 hours until bicarbonate level is above 20, resume his usual doses of insulin when his anion gap closes #2. Hyponatremia due to dehydration, continue IV fluids following sodium level tomorrow morning #3. Chest pain with elevated troponin, but normal  EKG, likely demand ischemia, get echocardiogram, cardiology consultation, follow cardiac enzymes 3 #4. Acute gastritis, initiate patient on Protonix intravenously and Carafate, follow clinically, starting him on a clear liquid diet whenever his anion gap closes #5. Cocaine abuse, discussed risks, voiced understanding #6. Tobacco abuse, initiate patient on nicotine replacement therapy, discussed this patient for 4 minutes. Patient is agreeable for replacement  therapy   All the records are reviewed and case discussed with ED provider. Management plans discussed with the patient, family and they are in agreement.  CODE STATUS: Code Status History    Date Active Date Inactive Code Status Order ID Comments User Context   10/23/2015  5:42 PM 10/25/2015  7:04 PM Full Code [id number]  Katha Hamming, MD ED   06/10/2015  8:20 AM 06/12/2015  2:41 PM Full Code [id number]  Arnaldo Natal, MD Inpatient   06/06/2015  8:43 PM 06/07/2015  4:20 PM Full Code [id number]  Enedina Finner, MD Inpatient   04/07/2015 12:45 PM 04/08/2015  8:37 PM Full Code [id number]  Altamese Dilling, MD Inpatient   12/09/2014  2:44 PM 12/13/2014  4:16 PM Full Code [id number]  Gale Journey, MD Inpatient       TOTAL TIME TAKING CARE OF THIS PATIENT: 50 minutes.    Katharina Caper M.D on 12/12/2015 at 6:27 PM  Between 7am to 6pm - Pager - [phone] After 6pm go to [email or web address] - password [email or web address] [facility]  [place] [facility]  Office  [phone]  CC: Primary care physician; No PCP Per Patient

## 2015-12-13 ENCOUNTER — Inpatient Hospital Stay (HOSPITAL_COMMUNITY)
Admit: 2015-12-13 | Discharge: 2015-12-13 | Disposition: A | Discharge status: Home / Self Care | Payer: Self-pay | Attending: Internal Medicine | Admitting: Internal Medicine

## 2015-12-13 DIAGNOSIS — F1410 Cocaine abuse, uncomplicated: Secondary | ICD-10-CM

## 2015-12-13 DIAGNOSIS — R079 Chest pain, unspecified: Secondary | ICD-10-CM

## 2015-12-13 DIAGNOSIS — R9431 Abnormal electrocardiogram [ECG] [EKG]: Secondary | ICD-10-CM

## 2015-12-13 LAB — BASIC METABOLIC PANEL
Anion gap: 14 (ref 5–15)
Anion gap: 7 (ref 5–15)
BUN: 15 mg/dL (ref 6–20)
BUN: 14 mg/dL (ref 6–20)
CO2: 15 mmol/L — ABNORMAL LOW (ref 22–32)
CO2: 19 mmol/L — ABNORMAL LOW (ref 22–32)
Calcium: 8 mg/dL — ABNORMAL LOW (ref 8.9–10.3)
Calcium: 8.3 mg/dL — ABNORMAL LOW (ref 8.9–10.3)
Chloride: 104 mmol/L (ref 101–111)
Chloride: 109 mmol/L (ref 101–111)
Creatinine, Ser: 0.91 mg/dL (ref 0.61–1.24)
Creatinine, Ser: 0.69 mg/dL (ref 0.61–1.24)
GFR calc Af Amer: >60 mL/min (ref >60)
GFR calc Af Amer: >60 mL/min (ref >60)
GFR calc non Af Amer: >60 mL/min (ref >60)
GFR calc non Af Amer: >60 mL/min (ref >60)
Glucose, Bld: 301 mg/dL — ABNORMAL HIGH (ref 65–99)
Glucose, Bld: 249 mg/dL — ABNORMAL HIGH (ref 65–99)
Potassium: 4 mmol/L (ref 3.5–5.1)
Potassium: 3.3 mmol/L — ABNORMAL LOW (ref 3.5–5.1)
Sodium: 133 mmol/L — ABNORMAL LOW (ref 135–145)
Sodium: 135 mmol/L (ref 135–145)

## 2015-12-13 LAB — GLUCOSE, RANDOM: Glucose, Bld: 482 mg/dL — ABNORMAL HIGH (ref 65–99)

## 2015-12-13 LAB — COMPREHENSIVE METABOLIC PANEL
ALT: 37 U/L (ref 17–63)
AST: 29 U/L (ref 15–41)
Albumin: 3.7 g/dL (ref 3.5–5.0)
Alkaline Phosphatase: 117 U/L (ref 38–126)
Anion gap: 9 (ref 5–15)
BUN: 13 mg/dL (ref 6–20)
CO2: 20 mmol/L — ABNORMAL LOW (ref 22–32)
Calcium: 8.4 mg/dL — ABNORMAL LOW (ref 8.9–10.3)
Chloride: 106 mmol/L (ref 101–111)
Creatinine, Ser: 0.7 mg/dL (ref 0.61–1.24)
GFR calc Af Amer: >60 mL/min (ref >60)
GFR calc non Af Amer: >60 mL/min (ref >60)
Glucose, Bld: 144 mg/dL — ABNORMAL HIGH (ref 65–99)
Potassium: 3.1 mmol/L — ABNORMAL LOW (ref 3.5–5.1)
Sodium: 135 mmol/L (ref 135–145)
Total Bilirubin: 0.5 mg/dL (ref 0.3–1.2)
Total Protein: 6.6 g/dL (ref 6.5–8.1)

## 2015-12-13 LAB — GLUCOSE, CAPILLARY
Glucose-Capillary: 142 mg/dL — ABNORMAL HIGH (ref 65–99)
Glucose-Capillary: 168 mg/dL — ABNORMAL HIGH (ref 65–99)
Glucose-Capillary: 175 mg/dL — ABNORMAL HIGH (ref 65–99)
Glucose-Capillary: 201 mg/dL — ABNORMAL HIGH (ref 65–99)
Glucose-Capillary: 204 mg/dL — ABNORMAL HIGH (ref 65–99)
Glucose-Capillary: 221 mg/dL — ABNORMAL HIGH (ref 65–99)
Glucose-Capillary: 246 mg/dL — ABNORMAL HIGH (ref 65–99)
Glucose-Capillary: 255 mg/dL — ABNORMAL HIGH (ref 65–99)
Glucose-Capillary: 274 mg/dL — ABNORMAL HIGH (ref 65–99)
Glucose-Capillary: 346 mg/dL — ABNORMAL HIGH (ref 65–99)
Glucose-Capillary: 427 mg/dL — ABNORMAL HIGH (ref 65–99)
Glucose-Capillary: 453 mg/dL — ABNORMAL HIGH (ref 65–99)
Glucose-Capillary: 465 mg/dL — ABNORMAL HIGH (ref 65–99)
Glucose-Capillary: 477 mg/dL — ABNORMAL HIGH (ref 65–99)
Glucose-Capillary: 477 mg/dL — ABNORMAL HIGH (ref 65–99)
Glucose-Capillary: 69 mg/dL (ref 65–99)
Glucose-Capillary: 79 mg/dL (ref 65–99)

## 2015-12-13 LAB — CBC
HCT: 40.4 % (ref 40.0–52.0)
Hemoglobin: 14 g/dL (ref 13.0–18.0)
MCH: 30.3 pg (ref 26.0–34.0)
MCHC: 34.5 g/dL (ref 32.0–36.0)
MCV: 87.7 fL (ref 80.0–100.0)
Platelets: 220 10*3/uL (ref 150–440)
RBC: 4.61 MIL/uL (ref 4.40–5.90)
RDW: 12.8 % (ref 11.5–14.5)
WBC: 7.9 10*3/uL (ref 3.8–10.6)

## 2015-12-13 LAB — TROPONIN I
Troponin I: <0.03 ng/mL (ref <0.031)
Troponin I: <0.03 ng/mL (ref <0.031)

## 2015-12-13 LAB — HEMOGLOBIN A1C: Hgb A1c MFr Bld: 12.7 % — ABNORMAL HIGH (ref 4.0–6.0)

## 2015-12-13 LAB — TSH: TSH: 0.165 u[IU]/mL — ABNORMAL LOW (ref 0.350–4.500)

## 2015-12-13 MED ORDER — HYDROCODONE-ACETAMINOPHEN 7.5-325 MG PO TABS
2.0000 | ORAL_TABLET | Freq: Four times a day (QID) | ORAL | Status: DC | PRN
  Administered 2015-12-13 – 2015-12-14 (×5): 2 via ORAL
  Filled 2015-12-13 (×5): qty 2

## 2015-12-13 MED ORDER — INSULIN GLARGINE 100 UNIT/ML ~~LOC~~ SOLN
12.0000 [IU] | Freq: Once | SUBCUTANEOUS | Status: AC
  Administered 2015-12-13: 12 [IU] via SUBCUTANEOUS
  Filled 2015-12-13: qty 0.12

## 2015-12-13 MED ORDER — PANTOPRAZOLE SODIUM 40 MG IV SOLR: 40.0000 mg | Freq: Two times a day (BID) | INTRAVENOUS | Status: DC

## 2015-12-13 MED ORDER — INSULIN ASPART 100 UNIT/ML ~~LOC~~ SOLN
0.0000 [IU] | Freq: Three times a day (TID) | SUBCUTANEOUS | Status: DC
  Administered 2015-12-13: 5 [IU] via SUBCUTANEOUS
  Administered 2015-12-14: 1 [IU] via SUBCUTANEOUS
  Administered 2015-12-14: 9 [IU] via SUBCUTANEOUS
  Filled 2015-12-13: qty 1
  Filled 2015-12-13: qty 5
  Filled 2015-12-13: qty 9

## 2015-12-13 MED ORDER — INSULIN GLARGINE 100 UNIT/ML ~~LOC~~ SOLN
12.0000 [IU] | Freq: Every day | SUBCUTANEOUS | Status: DC
  Filled 2015-12-13: qty 0.12

## 2015-12-13 MED ORDER — INSULIN ASPART 100 UNIT/ML IV SOLN
20.0000 [IU] | Freq: Once | INTRAVENOUS | Status: AC
  Administered 2015-12-13: 20 [IU] via INTRAVENOUS
  Filled 2015-12-13: qty 0.2

## 2015-12-13 MED ORDER — INSULIN GLARGINE 100 UNIT/ML ~~LOC~~ SOLN
20.0000 [IU] | Freq: Every day | SUBCUTANEOUS | Status: DC
  Administered 2015-12-13: 20 [IU] via SUBCUTANEOUS
  Filled 2015-12-13 (×2): qty 0.2

## 2015-12-13 MED ORDER — POTASSIUM CHLORIDE CRYS ER 20 MEQ PO TBCR
40.0000 meq | EXTENDED_RELEASE_TABLET | Freq: Once | ORAL | Status: AC
  Administered 2015-12-13: 40 meq via ORAL
  Filled 2015-12-13: qty 2

## 2015-12-13 MED ORDER — INSULIN ASPART 100 UNIT/ML ~~LOC~~ SOLN
5.0000 [IU] | Freq: Once | SUBCUTANEOUS | Status: AC
  Administered 2015-12-13: 5 [IU] via SUBCUTANEOUS
  Filled 2015-12-13: qty 5

## 2015-12-13 MED ORDER — SODIUM CHLORIDE 0.9 % IV SOLN: INTRAVENOUS | Status: DC

## 2015-12-13 NOTE — Progress Notes (Signed)
[facility] [place] [facility]                                                                                                                                                                                            Patient Demographics   Trevor Brown, is a 25 y.o. male, DOB - [birth year], JXB:[id number]  Admit date - 12/12/2015   Admitting Physician Katharina Caperima Vaickute, MD  Outpatient Primary MD for the patient is No PCP Per Patient   LOS - 1  Subjective:patient admited with DKA  Now DKA resolved, c/o epigastric pain No nausea and vomiting     Review of Systems:   CONSTITUTIONAL: No documented fever. No fatigue, weakness. No weight gain, no weight loss.  EYES: No blurry or double vision.  ENT: No tinnitus. No postnasal drip. No redness of the oropharynx.  RESPIRATORY: No cough, no wheeze, no hemoptysis. No dyspnea.  CARDIOVASCULAR: +chest pain. No orthopnea. No palpitations. No syncope.  GASTROINTESTINAL: No nausea, no vomiting or diarrhea. +abdominal pain. No melena or hematochezia.  GENITOURINARY: No dysuria or hematuria.  ENDOCRINE: No polyuria or nocturia. No heat or cold intolerance.  HEMATOLOGY: No anemia. No bruising. No bleeding.  INTEGUMENTARY: No rashes. No lesions.  MUSCULOSKELETAL: No arthritis. No swelling. No gout.  NEUROLOGIC: No numbness, tingling, or ataxia. No seizure-type activity.  PSYCHIATRIC: No anxiety. No insomnia. No ADD.    Vitals:   Filed Vitals:   12/13/15 0600 12/13/15 0700 12/13/15 0718 12/13/15 0800  BP: 121/78 126/89  113/76  Pulse: 81 75  75  Temp:   98.5 F (36.9 C)   TempSrc:   Oral   Resp: 23 22  19   Height:      Weight:      SpO2: 100% 100%  98%    Wt Readings from Last 3 Encounters:  12/12/15 51 kg (112 lb 7 oz)  10/25/15 50.848 kg (112 lb 1.6 oz)  09/06/15 54.432 kg (120 lb)     Intake/Output Summary (Last 24 hours) at 12/13/15 1022 Last data filed at 12/13/15 0934  Gross per 24 hour  Intake  805.96 ml  Output   1650 ml  Net -844.04 ml    Physical Exam:   GENERAL: Pleasant-appearing in no apparent distress.  HEAD, EYES, EARS, NOSE AND THROAT: Atraumatic, normocephalic. Extraocular muscles are intact. Pupils equal and reactive to light. Sclerae anicteric. No conjunctival injection. No oro-pharyngeal erythema.  NECK: Supple. There is no jugular venous distention. No bruits, no lymphadenopathy, no thyromegaly.  HEART: Regular rate and rhythm,. No murmurs, no rubs, no clicks.  LUNGS: Clear to auscultation bilaterally. No  rales or rhonchi. No wheezes.  ABDOMEN: Soft, flat, + epigastric tendreness, nondistended. Has good bowel sounds. No hepatosplenomegaly appreciated.  EXTREMITIES: No evidence of any cyanosis, clubbing, or peripheral edema.  +2 pedal and radial pulses bilaterally.  NEUROLOGIC: The patient is alert, awake, and oriented x3 with no focal motor or sensory deficits appreciated bilaterally.  SKIN: Moist and warm with no rashes appreciated.  Psych: Not anxious, depressed LN: No inguinal LN enlargement    Antibiotics   Anti-infectives    None      Medications   Scheduled Meds: . aspirin EC  81 mg Oral Daily  . enoxaparin (LOVENOX) injection  40 mg Subcutaneous Q24H  . insulin aspart  0-9 Units Subcutaneous TID WC  . insulin glargine  12 Units Subcutaneous QHS  . nicotine  21 mg Transdermal Daily  . potassium chloride  40 mEq Oral Once  . sodium chloride flush  3 mL Intravenous Q12H  . sucralfate  1 g Oral Q4H while awake   Continuous Infusions: . sodium chloride Stopped (12/13/15 0135)  . dextrose 5 % and 0.45% NaCl Stopped (12/13/15 0914)  . insulin (NOVOLIN-R) infusion Stopped (12/13/15 0914)   PRN Meds:.acetaminophen **OR** acetaminophen, diazepam, morphine injection, nicotine polacrilex, ondansetron **OR** ondansetron (ZOFRAN) IV   Data Review:   Micro Results Recent Results (from the past 240 hour(s))  MRSA PCR Screening     Status: None    Collection Time: 12/12/15 10:19 PM  Result Value Ref Range Status   MRSA by PCR NEGATIVE NEGATIVE Final    Comment:        The GeneXpert MRSA Assay (FDA approved for NASAL specimens only), is one component of a comprehensive MRSA colonization surveillance program. It is not intended to diagnose MRSA infection nor to guide or monitor treatment for MRSA infections.     Radiology Reports No results found.   CBC  Recent Labs Lab 12/12/15 1538 12/12/15 2243 12/13/15 0612  WBC 10.1 9.0 7.9  HGB 16.4 13.6 14.0  HCT 48.4 40.5 40.4  PLT 245 219 220  MCV 89.4 88.6 87.7  MCH 30.2 29.7 30.3  MCHC 33.8 33.6 34.5  RDW 12.8 12.7 12.8    Chemistries   Recent Labs Lab 12/12/15 1530 12/12/15 1831 12/12/15 2243 12/13/15 0207 12/13/15 0612  NA 133* 136 133* 135 135  K 4.5 4.2 4.0 3.3* 3.1*  CL 96* 106 104 109 106  CO2 15* 16* 15* 19* 20*  GLUCOSE 365* 239* 301* 249* 144*  BUN 20 19 15 14 13   CREATININE 1.01 0.87 0.91 0.69 0.70  CALCIUM 9.5 8.2* 8.0* 8.3* 8.4*  MG  --  1.6*  --   --   --   AST 42*  --   --   --  29  ALT 55  --   --   --  37  ALKPHOS 168*  --   --   --  117  BILITOT 1.4*  --   --   --  0.5   ------------------------------------------------------------------------------------------------------------------ estimated creatinine clearance is 104.5 mL/min (by C-G formula based on Cr of 0.7). ------------------------------------------------------------------------------------------------------------------ No results for input(s): HGBA1C in the last 72 hours. ------------------------------------------------------------------------------------------------------------------ No results for input(s): CHOL, HDL, LDLCALC, TRIG, CHOLHDL, LDLDIRECT in the last 72 hours. ------------------------------------------------------------------------------------------------------------------  Recent Labs  12/12/15 2243  TSH 0.165*    ------------------------------------------------------------------------------------------------------------------ No results for input(s): VITAMINB12, FOLATE, FERRITIN, TIBC, IRON, RETICCTPCT in the last 72 hours.  Coagulation profile No results for input(s): INR, PROTIME in the last  168 hours.  No results for input(s): DDIMER in the last 72 hours.  Cardiac Enzymes  Recent Labs Lab 12/12/15 1530 12/12/15 2243 12/13/15 0612  TROPONINI 0.09* <0.03 <0.03   ------------------------------------------------------------------------------------------------------------------ Invalid input(s): POCBNP    Assessment & Plan  Carollee Herter is a 25 year old with history of diabetes  1 DKA, type I, now resolved patient's home dose Lantus has been resumed. Continue sliding scale and diabetic teaching #2. Hyponatremia due to dehydration, resolved with correction of his blood sugar and IV fluids #3. Chest pain with elevated troponin,  Suspect demand ischemia and cocaine use  #4. Acute gastritis, place patient on IV Protonix continue Carafate  #5. Cocaine abuse, recommended to stop #6. Hypokalemia replace his potassium      Code Status Orders        Start     Ordered   12/12/15 2205  Full code   Continuous     12/12/15 2204    Code Status History    Date Active Date Inactive Code Status Order ID Comments User Context   10/23/2015  5:42 PM 10/25/2015  7:04 PM Full Code [id number]  Katha Hamming, MD ED   06/10/2015  8:20 AM 06/12/2015  2:41 PM Full Code [id number]  Arnaldo Natal, MD Inpatient   06/06/2015  8:43 PM 06/07/2015  4:20 PM Full Code [id number]  Enedina Finner, MD Inpatient   04/07/2015 12:45 PM 04/08/2015  8:37 PM Full Code [id number]  Altamese Dilling, MD Inpatient   12/09/2014  2:44 PM 12/13/2014  4:16 PM Full Code [id number]  Gale Journey, MD Inpatient           Consults None DVT Prophylaxis  Lovenox   Lab Results  Component Value Date   PLT 220 12/13/2015      Time Spent in minutes    Greater than 50% of time spent in care coordination and counseling patient regarding the condition and plan of care.   Auburn Bilberry M.D on 12/13/2015 at 10:22 AM  Between 7am to 6pm - Pager - [phone]  After 6pm go to [email or web address] - password [email or web address] [facility]  [facility]   Office  [phone]

## 2015-12-13 NOTE — Progress Notes (Signed)
Lab called this AM to question next troponin lab draw as patient has already had three levels checked with the last two being negative. Dr. Allena KatzPatel notified and he said it would be okay to not collect 4th troponin level, in addition pt's K level this AM was low at 3.1. Per MD will give patient 40meq of K once to replete.

## 2015-12-13 NOTE — Care Management (Signed)
CM had long discussion with patient regarding the need to take active involvement in management of his disease process- diabetes. Discussed areas of potential complications regarding heart, kidney, circulation,neurological systems.  Patient offers little in the way of verbal responses during conversation.  discussed that he would qualify for services at the [facility] but he must take responsibility and complete the application.  Discussed that he would not qualify for continuous services at the [facility] without a physician either at [facility] for a private PCP. Patient declines to start completing the [facility] application so that care manager can fax.  He says his sister will help him.  Acknowledges that he received an application for [facility] but he lost it.  [facility] has the following insulin types on hand: Novolog, Novolin 70/30 and Victoza.

## 2015-12-13 NOTE — Progress Notes (Signed)
*  PRELIMINARY RESULTS* Echocardiogram 2D Echocardiogram has been performed.  Trevor HousekeeperJerry R Brown 12/13/2015, 2:22 PM

## 2015-12-13 NOTE — Progress Notes (Signed)
Trevor Brown from infection prevention called and per pt's chart he was negative for MRSA on 5/23. Because of this, he will not need to remain on precautions. Chart and orders updated.

## 2015-12-13 NOTE — Progress Notes (Signed)
Spoke with patient regarding cause of admission/DKA.  He states "they didn't give me enough insulin".  Note that patient was here in April of 2017 and was given information regarding [facility].  He states that he did not follow-up.  I explained to him that he could not rely on the hospital to be his primary care doctor and that follow-up is crucial for him to manage his diabetes.  Unclear whether patient truly understands the long-term consequences of not controlling his blood sugars.  He did state that he needs "someone to look at his teeth" also.  Again emphasized importance of follow-up.  Patient seems apathetic to his situation.  Discussed with RN and case Production designer, theatre/television/filmmanager.    Please consider adding Novolog meal coverage 4 units tid with meals to cover CHO intake.   Thanks Beryl MeagerJenny Chino Sardo, RN, BC-ADM Inpatient Diabetes Coordinator Pager [phone] (8a-5p)

## 2015-12-13 NOTE — Progress Notes (Signed)
Pts. Blood sugar 477, doctor notified, stat random glucose lab put in per protocol, one time order of 20 units of IV Novolog ordered, administered, will perform follow-up blood sugar check accordingly. Adelina MingsKim Jahrel Borthwick RN-BC

## 2015-12-14 LAB — BASIC METABOLIC PANEL
Anion gap: 9 (ref 5–15)
BUN: 11 mg/dL (ref 6–20)
CO2: 25 mmol/L (ref 22–32)
Calcium: 8.8 mg/dL — ABNORMAL LOW (ref 8.9–10.3)
Chloride: 101 mmol/L (ref 101–111)
Creatinine, Ser: 0.56 mg/dL — ABNORMAL LOW (ref 0.61–1.24)
GFR calc Af Amer: >60 mL/min (ref >60)
GFR calc non Af Amer: >60 mL/min (ref >60)
Glucose, Bld: 243 mg/dL — ABNORMAL HIGH (ref 65–99)
Potassium: 3.5 mmol/L (ref 3.5–5.1)
Sodium: 135 mmol/L (ref 135–145)

## 2015-12-14 LAB — BLOOD GAS, VENOUS
Patient temperature: 37
pCO2, Ven: 37 mmHg — ABNORMAL LOW (ref 44.0–60.0)
pH, Ven: 7.23 — ABNORMAL LOW (ref 7.320–7.430)

## 2015-12-14 LAB — GLUCOSE, CAPILLARY
Glucose-Capillary: 141 mg/dL — ABNORMAL HIGH (ref 65–99)
Glucose-Capillary: 413 mg/dL — ABNORMAL HIGH (ref 65–99)

## 2015-12-14 LAB — ECHOCARDIOGRAM COMPLETE
Height: 64 in
Weight: 1798.95 oz

## 2015-12-14 MED ORDER — INSULIN ASPART PROT & ASPART (70-30 MIX) 100 UNIT/ML ~~LOC~~ SUSP: 24.0000 [IU] | Freq: Two times a day (BID) | SUBCUTANEOUS | Status: DC

## 2015-12-14 MED ORDER — PANTOPRAZOLE SODIUM 40 MG PO TBEC: 40.0000 mg | DELAYED_RELEASE_TABLET | Freq: Every day | ORAL | Status: DC

## 2015-12-14 NOTE — Progress Notes (Signed)
RN notified Dr. Allena KatzPatel concerning CBG 413 at this time. Pt is not compliant with diet order. Verbal order to administer 9 units of novolog at this time due to sliding scale. Will continue to monitor pt.   Karsten RoLauren E Hobbs

## 2015-12-14 NOTE — Progress Notes (Signed)
Inpatient Diabetes Program Recommendations  AACE/ADA: New Consensus Statement on Inpatient Glycemic Control (2015)  Target Ranges:  Prepandial:   less than 140 mg/dL      Peak postprandial:   less than 180 mg/dL (1-2 hours)      Critically ill patients:  140 - 180 mg/dL   Spoke with patient regarding 70/30 insulin regimen instead of Lantus/Novolog. Explained that 70/30 needs to be taken with meals because it covers food and basal needs.  Also explained that patient cannot skip meals and needs to check blood sugars prior to taking insulin.  Asked patient to teach back and he states "I take this insulin with breakfast and supper- 2 times a day".  Also explained that the Novolin brand of this insulin [facility].  Patient was eager to go home and states, I will take this when I eat supper this evening.    Beryl MeagerJenny Maryfrances Portugal, RN, BC-ADM Inpatient Diabetes Coordinator Pager [phone] (8a-5p)

## 2015-12-14 NOTE — Care Management (Signed)
Received call from [facility] regarding script for insulin.  Provided attending pager so pharmacist could discuss directly with physician

## 2015-12-14 NOTE — Discharge Summary (Signed)
Trevor Brown, 24 y.o., DOB Jun 02, 1991, MRN [id number]. Admission date: 12/12/2015 Discharge Date 12/14/2015 Primary MD No PCP Per Patient Admitting Physician Katharina Caper, MD  Admission Diagnosis  Epigastric pain [R10.13] Cocaine abuse [F14.10] NSTEMI (non-ST elevated myocardial infarction) (HCC) [I21.4] Diabetic ketoacidosis without coma associated with type 1 diabetes mellitus (HCC) [E10.10] Non-intractable vomiting with nausea, vomiting of unspecified type [R11.2]  Discharge Diagnosis   Principal Problem:   DKA, type 1 (HCC)   Acute gastritis   Hyponatremia   Type 1 diabetes mellitus with hyperglycemia (HCC)   Chest pain   Elevated troponin   Cocaine abuse         Hospital Course patient is a 25 year old male with history of diabetes type 1, hepatitis C, coronary artery disease with medical noncompliance who presented with abdominal pain. He also was noted to have elevated blood sugars. Patient apparently ran out of his insulin for 1 week. He was noted to be in DKA. And thought to have acute gastritis. He was admitted for further evaluation and treatment. Patient was placed in the ICU and started on insulin drip with resolution of his symptoms. He also had abdominal pain thought to be due to gastritis. During his hospital stay patient was very noncompliant with his diet and eating whatever he wanted. He is recommended to follow a diet regimen. He will have insulin through the med management clinic.             Consults  endocronlogy   Significant Tests:  See full reports for all details      No results found.     Today   Subjective:   Trevor Brown   Feels better no complatins  Objective:   Blood pressure 117/70, pulse 57, temperature 98 F (36.7 C), temperature source Oral, resp. rate 20, height  (1.626 m), weight 51 kg (112 lb 7 oz), SpO2 98 %.  .  Intake/Output Summary (Last 24 hours) at 12/14/15 1339 Last data filed at 12/13/15 2129  Gross per 24  hour  Intake    603 ml  Output      0 ml  Net    603 ml    Exam VITAL SIGNS: Blood pressure 117/70, pulse 57, temperature 98 F (36.7 C), temperature source Oral, resp. rate 20, height  (1.626 m), weight 51 kg (112 lb 7 oz), SpO2 98 %.  GENERAL:  25 y.o.-year-old patient lying in the bed with no acute distress.  EYES: Pupils equal, round, reactive to light and accommodation. No scleral icterus. Extraocular muscles intact.  HEENT: Head atraumatic, normocephalic. Oropharynx and nasopharynx clear.  NECK:  Supple, no jugular venous distention. No thyroid enlargement, no tenderness.  LUNGS: Normal breath sounds bilaterally, no wheezing, rales,rhonchi or crepitation. No use of accessory muscles of respiration.  CARDIOVASCULAR: S1, S2 normal. No murmurs, rubs, or gallops.  ABDOMEN: Soft, nontender, nondistended. Bowel sounds present. No organomegaly or mass.  EXTREMITIES: No pedal edema, cyanosis, or clubbing.  NEUROLOGIC: Cranial nerves II through XII are intact. Muscle strength 5/5 in all extremities. Sensation intact. Gait not checked.  PSYCHIATRIC: The patient is alert and oriented x 3.  SKIN: No obvious rash, lesion, or ulcer.   Data Review     CBC w Diff: Lab Results  Component Value Date   WBC 7.9 12/13/2015   WBC 12.5* 04/06/2014   HGB 14.0 12/13/2015   HGB 13.1 04/06/2014   HCT 40.4 12/13/2015   HCT 38.7* 04/06/2014   PLT 220 12/13/2015  PLT 157 04/06/2014   LYMPHOPCT 6 10/23/2015   LYMPHOPCT 19.7 04/06/2014   MONOPCT 3 10/23/2015   MONOPCT 9.3 04/06/2014   EOSPCT 0 10/23/2015   EOSPCT 0.9 04/06/2014   BASOPCT 0 10/23/2015   BASOPCT 0.3 04/06/2014   CMP: Lab Results  Component Value Date   NA 135 12/14/2015   NA 137 04/06/2014   K 3.5 12/14/2015   K 3.8 04/06/2014   CL 101 12/14/2015   CL 105 04/06/2014   CO2 25 12/14/2015   CO2 25 04/06/2014   BUN 11 12/14/2015   BUN 13 04/06/2014   CREATININE 0.56* 12/14/2015   CREATININE 0.64 04/06/2014   PROT 6.6  12/13/2015   PROT 7.3 04/05/2014   ALBUMIN 3.7 12/13/2015   ALBUMIN 3.4 04/05/2014   BILITOT 0.5 12/13/2015   BILITOT 0.3 04/05/2014   ALKPHOS 117 12/13/2015   ALKPHOS 196* 04/05/2014   AST 29 12/13/2015   AST 24 04/05/2014   ALT 37 12/13/2015   ALT 34 04/05/2014  .  Micro Results Recent Results (from the past 240 hour(s))  MRSA PCR Screening     Status: None   Collection Time: 12/12/15 10:19 PM  Result Value Ref Range Status   MRSA by PCR NEGATIVE NEGATIVE Final    Comment:        The GeneXpert MRSA Assay (FDA approved for NASAL specimens only), is one component of a comprehensive MRSA colonization surveillance program. It is not intended to diagnose MRSA infection nor to guide or monitor treatment for MRSA infections.         Code Status Orders        Start     Ordered   12/12/15 2205  Full code   Continuous     12/12/15 2204    Code Status History    Date Active Date Inactive Code Status Order ID Comments User Context   10/23/2015  5:42 PM 10/25/2015  7:04 PM Full Code [id number]  Katha HammingSnehalatha Konidena, MD ED   06/10/2015  8:20 AM 06/12/2015  2:41 PM Full Code [id number]  Arnaldo NatalMichael S Diamond, MD Inpatient   06/06/2015  8:43 PM 06/07/2015  4:20 PM Full Code [id number]  Enedina FinnerSona Damontae Loppnow, MD Inpatient   04/07/2015 12:45 PM 04/08/2015  8:37 PM Full Code [id number]  Altamese DillingVaibhavkumar Vachhani, MD Inpatient   12/09/2014  2:44 PM 12/13/2014  4:16 PM Full Code [id number]  Gale Journeyatherine P Walsh, MD Inpatient          Follow-up Information    Follow up with [facility] In 10 days.   Specialty:  Primary Care   Contact information:   [address] [place] [id number] [phone]      Discharge Medications     Medication List    STOP taking these medications        insulin aspart 100 UNIT/ML injection  Commonly known as:  novoLOG     insulin glargine 100 UNIT/ML injection  Commonly known as:  LANTUS      TAKE these  medications        insulin aspart protamine- aspart (70-30) 100 UNIT/ML injection  Commonly known as:  NOVOLOG MIX 70/30  Inject 0.24 mLs (24 Units total) into the skin 2 (two) times daily with a meal.     pantoprazole 40 MG tablet  Commonly known as:  PROTONIX  Take 1 tablet (40 mg total) by mouth daily.  Total Time in preparing paper work, data evaluation and todays exam - 35 minutes  Auburn Bilberry M.D on 12/14/2015 at 1:39 PM  [facility]   Office  [phone]

## 2015-12-14 NOTE — Discharge Instructions (Signed)
°  DIET:  °Diabetic diet ° °DISCHARGE CONDITION:  °Stable ° °ACTIVITY:  °Activity as tolerated ° °OXYGEN:  °Home Oxygen: No. °  °Oxygen Delivery: room air ° °DISCHARGE LOCATION:  °home  ° ° °ADDITIONAL DISCHARGE INSTRUCTION: ° ° °If you experience worsening of your admission symptoms, develop shortness of breath, life threatening emergency, suicidal or homicidal thoughts you must seek medical attention immediately by calling 911 or calling your MD immediately  if symptoms less severe. ° °You Must read complete instructions/literature along with all the possible adverse reactions/side effects for all the Medicines you take and that have been prescribed to you. Take any new Medicines after you have completely understood and accpet all the possible adverse reactions/side effects.  ° °Please note ° °You were cared for by a hospitalist during your hospital stay. If you have any questions about your discharge medications or the care you received while you were in the hospital after you are discharged, you can call the unit and asked to speak with the hospitalist on call if the hospitalist that took care of you is not available. Once you are discharged, your primary care physician will handle any further medical issues. Please note that NO REFILLS for any discharge medications will be authorized once you are discharged, as it is imperative that you return to your primary care physician (or establish a relationship with a primary care physician if you do not have one) for your aftercare needs so that they can reassess your need for medications and monitor your lab values. ° ° °

## 2015-12-14 NOTE — Progress Notes (Signed)
Patient discharge teaching given, including activity, diet, follow-up appoints, and medications. Patient verbalized understanding of all discharge instructions. IV accesses were d/c'd and nicotine patch was removed. Vitals are stable. Skin is intact except as charted in most recent assessments. Pt walked out with father. Pt expressed to RN that he was going to head to the clinic in the a.m. To make an appointment to be seen about his blood sugars.   Trevor RoLauren E Hobbs

## 2015-12-14 NOTE — Progress Notes (Signed)
   12/14/15 1200  Clinical Encounter Type  Visited With Patient  Visit Type Initial  Consult/Referral To Chaplain  Stress Factors  Patient Stress Factors None identified  Chaplain visited with patient prior to his discharged. Patient not talkative giving one word responses to questions and not willing to elaborate on responses.   Fisher ScientificChaplain Dahmir Epperly [phone]

## 2015-12-14 NOTE — Progress Notes (Addendum)
Inpatient Diabetes Program Recommendations  AACE/ADA: New Consensus Statement on Inpatient Glycemic Control (2015)  Target Ranges:  Prepandial:   less than 140 mg/dL      Peak postprandial:   less than 180 mg/dL (1-2 hours)      Critically ill patients:  140 - 180 mg/dL   Review of Glycemic Control:  Results for [name], Chriss B (MRN [id number]) as of 12/14/2015 09:28  Ref. Range 12/13/2015 18:33 12/13/2015 19:00 12/13/2015 20:01 12/13/2015 21:13 12/14/2015 07:28  Glucose-Capillary Latest Ref Range: 65-99 mg/dL 409453 (H) 811346 (H) 914477 (H) 427 (H) 141 (H)    Diabetes history: Type 1 diabetes Outpatient Diabetes medications: Lantus 20 units q HS, Novolog 5 units tid with meals Current orders for Inpatient glycemic control:  Novolog sensitive tid with meals, Lantus 20 units q HS  Inpatient Diabetes Program Recommendations:    Note that CBG's increased after transition off insulin drip on 12/13/15.  Patient received a total of 32 units of Lantus and Novolog 30 units after transition off insulin drip.  Since patient has Type 1 diabetes, he will need additional coverage of carbohydrates when he eats.  Consider adding Novolog 4 units tid with meals.  Further he likely would benefit from increased basal insulin.  Consider increasing Lantus to 25 units daily.  Thanks, Beryl MeagerJenny Dionne Knoop, RN, BC-ADM Inpatient Diabetes Coordinator Pager [phone] (8a-5p)  Addendum: Note that MD has changed patient's insulin regimen to 70/30 24 units bid.  This may be a better alternative due to cost and it will cover both basal and meal coverage in 2 shots.  Will discuss with patient also.

## 2015-12-14 NOTE — Care Management (Signed)
Patient is for discharge home today.  spoke with patient about barriers to his motivation to intervene on his own behalf- especially regarding completeion of the [employer] application.  he says the only barrier is that he lost it.  Assures CM he will complete the application.  CM is not convinced.  [facility].  Instructed patient to go to the clinic to obtain his meds.  Verbalized understanding.

## 2016-01-10 ENCOUNTER — Inpatient Hospital Stay
Admission: EM | Admit: 2016-01-10 | Discharge: 2016-01-11 | DRG: 638 | Discharge status: Against Medical Advice | Payer: Self-pay | Attending: Internal Medicine | Admitting: Internal Medicine

## 2016-01-10 ENCOUNTER — Inpatient Hospital Stay: Payer: Self-pay

## 2016-01-10 ENCOUNTER — Encounter: Payer: Self-pay | Admitting: *Deleted

## 2016-01-10 ENCOUNTER — Other Ambulatory Visit: Payer: Self-pay

## 2016-01-10 DIAGNOSIS — F17210 Nicotine dependence, cigarettes, uncomplicated: Secondary | ICD-10-CM | POA: Diagnosis present

## 2016-01-10 DIAGNOSIS — Z9114 Patient's other noncompliance with medication regimen: Secondary | ICD-10-CM

## 2016-01-10 DIAGNOSIS — Z888 Allergy status to other drugs, medicaments and biological substances status: Secondary | ICD-10-CM

## 2016-01-10 DIAGNOSIS — Z794 Long term (current) use of insulin: Secondary | ICD-10-CM

## 2016-01-10 DIAGNOSIS — R112 Nausea with vomiting, unspecified: Secondary | ICD-10-CM | POA: Diagnosis present

## 2016-01-10 DIAGNOSIS — R1116 Cannabis hyperemesis syndrome: Secondary | ICD-10-CM | POA: Diagnosis present

## 2016-01-10 DIAGNOSIS — Z79899 Other long term (current) drug therapy: Secondary | ICD-10-CM

## 2016-01-10 DIAGNOSIS — I252 Old myocardial infarction: Secondary | ICD-10-CM

## 2016-01-10 DIAGNOSIS — Z720 Tobacco use: Secondary | ICD-10-CM | POA: Diagnosis present

## 2016-01-10 DIAGNOSIS — E1010 Type 1 diabetes mellitus with ketoacidosis without coma: Principal | ICD-10-CM | POA: Diagnosis present

## 2016-01-10 DIAGNOSIS — E440 Moderate protein-calorie malnutrition: Secondary | ICD-10-CM | POA: Diagnosis present

## 2016-01-10 DIAGNOSIS — M940 Chondrocostal junction syndrome [Tietze]: Secondary | ICD-10-CM | POA: Diagnosis present

## 2016-01-10 DIAGNOSIS — B1920 Unspecified viral hepatitis C without hepatic coma: Secondary | ICD-10-CM | POA: Diagnosis present

## 2016-01-10 DIAGNOSIS — R079 Chest pain, unspecified: Secondary | ICD-10-CM | POA: Diagnosis present

## 2016-01-10 DIAGNOSIS — K219 Gastro-esophageal reflux disease without esophagitis: Secondary | ICD-10-CM | POA: Diagnosis present

## 2016-01-10 DIAGNOSIS — E860 Dehydration: Secondary | ICD-10-CM | POA: Diagnosis present

## 2016-01-10 DIAGNOSIS — E1110 Type 2 diabetes mellitus with ketoacidosis without coma: Secondary | ICD-10-CM | POA: Diagnosis present

## 2016-01-10 DIAGNOSIS — Z880 Allergy status to penicillin: Secondary | ICD-10-CM

## 2016-01-10 DIAGNOSIS — F1410 Cocaine abuse, uncomplicated: Secondary | ICD-10-CM | POA: Diagnosis present

## 2016-01-10 DIAGNOSIS — Z833 Family history of diabetes mellitus: Secondary | ICD-10-CM

## 2016-01-10 DIAGNOSIS — D72829 Elevated white blood cell count, unspecified: Secondary | ICD-10-CM | POA: Diagnosis present

## 2016-01-10 DIAGNOSIS — E875 Hyperkalemia: Secondary | ICD-10-CM | POA: Diagnosis present

## 2016-01-10 LAB — CBC WITH DIFFERENTIAL/PLATELET
Basophils Absolute: 0.1 10*3/uL (ref 0–0.1)
Basophils Relative: 0 %
Eosinophils Absolute: 0 10*3/uL (ref 0–0.7)
Eosinophils Relative: 0 %
HCT: 44.4 % (ref 40.0–52.0)
Hemoglobin: 13.9 g/dL (ref 13.0–18.0)
Lymphocytes Relative: 12 %
Lymphs Abs: 2.3 10*3/uL (ref 1.0–3.6)
MCH: 30.2 pg (ref 26.0–34.0)
MCHC: 31.4 g/dL — ABNORMAL LOW (ref 32.0–36.0)
MCV: 96.3 fL (ref 80.0–100.0)
Monocytes Absolute: 1.5 10*3/uL — ABNORMAL HIGH (ref 0.2–1.0)
Monocytes Relative: 8 %
Neutro Abs: 14.5 10*3/uL — ABNORMAL HIGH (ref 1.4–6.5)
Neutrophils Relative %: 80 %
Platelets: 345 10*3/uL (ref 150–440)
RBC: 4.61 MIL/uL (ref 4.40–5.90)
RDW: 13.2 % (ref 11.5–14.5)
WBC: 18.4 10*3/uL — ABNORMAL HIGH (ref 3.8–10.6)

## 2016-01-10 LAB — BASIC METABOLIC PANEL
BUN: 25 mg/dL — ABNORMAL HIGH (ref 6–20)
CO2: <7 mmol/L — ABNORMAL LOW (ref 22–32)
Calcium: 9.4 mg/dL (ref 8.9–10.3)
Chloride: 96 mmol/L — ABNORMAL LOW (ref 101–111)
Creatinine, Ser: 1.73 mg/dL — ABNORMAL HIGH (ref 0.61–1.24)
GFR calc Af Amer: >60 mL/min (ref >60)
GFR calc non Af Amer: 54 mL/min — ABNORMAL LOW (ref >60)
Glucose, Bld: 806 mg/dL (ref 65–99)
Potassium: 5.5 mmol/L — ABNORMAL HIGH (ref 3.5–5.1)
Sodium: 132 mmol/L — ABNORMAL LOW (ref 135–145)

## 2016-01-10 LAB — URINALYSIS COMPLETE WITH MICROSCOPIC (ARMC ONLY)
Bacteria, UA: NONE SEEN
Bilirubin Urine: NEGATIVE
Glucose, UA: >500 mg/dL — AB
Leukocytes, UA: NEGATIVE
Nitrite: NEGATIVE
Protein, ur: NEGATIVE mg/dL
Specific Gravity, Urine: 1.021 (ref 1.005–1.030)
Squamous Epithelial / LPF: NONE SEEN
pH: 5 (ref 5.0–8.0)

## 2016-01-10 LAB — BLOOD GAS, VENOUS
Acid-base deficit: 23.6 mmol/L — ABNORMAL HIGH (ref 0.0–2.0)
Bicarbonate: 5.4 mEq/L — ABNORMAL LOW (ref 21.0–28.0)
O2 Saturation: 51.4 %
Patient temperature: 37
pCO2, Ven: 20 mmHg — CL (ref 44.0–60.0)
pH, Ven: 7.04 — CL (ref 7.320–7.430)
pO2, Ven: 42 mmHg (ref 31.0–45.0)

## 2016-01-10 LAB — GLUCOSE, CAPILLARY
Glucose-Capillary: >600 mg/dL (ref 65–99)
Glucose-Capillary: 572 mg/dL (ref 65–99)
Glucose-Capillary: 508 mg/dL — ABNORMAL HIGH (ref 65–99)

## 2016-01-10 LAB — BETA-HYDROXYBUTYRIC ACID: Beta-Hydroxybutyric Acid: >8 mmol/L — ABNORMAL HIGH (ref 0.05–0.27)

## 2016-01-10 MED ORDER — SODIUM CHLORIDE 0.9 % IV SOLN
INTRAVENOUS | Status: DC
  Administered 2016-01-10: 5.1 [IU]/h via INTRAVENOUS
  Filled 2016-01-10 (×2): qty 2.5

## 2016-01-10 MED ORDER — SODIUM CHLORIDE 0.9 % IV BOLUS (SEPSIS)
1000.0000 mL | Freq: Once | INTRAVENOUS | Status: AC
  Administered 2016-01-10: 1000 mL via INTRAVENOUS

## 2016-01-10 MED ORDER — SODIUM CHLORIDE 0.9 % IV SOLN
INTRAVENOUS | Status: DC
  Administered 2016-01-10: 22:00:00 via INTRAVENOUS

## 2016-01-10 NOTE — ED Notes (Signed)
Pt brought in via ems from home.  Pt has not taken insulin for 2 weeks because he ran out of it.  Pt has 1 iv in place.  Eyes closed but responds to verbal stimuli.  md at bedside.  fsbs-HIGH

## 2016-01-10 NOTE — H&P (Signed)
PCP:   No PCP Per Patient   Chief Complaint:  Chest pain  HPI: This is a 25 year old gentleman with diabetes mellitus type 1. He states he is not taking his insulin for the last 2 days because he ran out. He had intractable nausea and vomiting this morning. He went on to have subsequent chest pain is right-sided at its worse 5/10. He came to the ER. The patient is UDS positive he states that he last used cocaine during the last week. He denies any fevers or chills. He sluggish it is difficult to get history from the patient.  Review of Systems:   Difficult to obtain due to patient's sluggishisness  Past Medical History: Past Medical History  Diagnosis Date  . Diabetes mellitus without complication (HCC)   . Hepatitis C, acute may 2016  . Heart attack (HCC)     Pt claims he had heart attack a year ago ( 2015)- and was admitted in [facility] for that, but not given any meds or angiogram, on review of chart- I could not find any details like that.   No past surgical history on file.None   Medications: Prior to Admission medications   Medication Sig Start Date End Date Taking? Authorizing Provider  insulin aspart protamine- aspart (NOVOLOG MIX 70/30) (70-30) 100 UNIT/ML injection Inject 0.24 mLs (24 Units total) into the skin 2 (two) times daily with a meal. 12/14/15  Yes Auburn Bilberry, MD  oxyCODONE (OXY IR/ROXICODONE) 5 MG immediate release tablet Take 5 mg by mouth every 6 (six) hours as needed. 01/02/16  Yes Historical Provider, MD  pantoprazole (PROTONIX) 40 MG tablet Take 1 tablet (40 mg total) by mouth daily. 12/14/15  Yes Auburn Bilberry, MD  clindamycin (CLEOCIN) 300 MG capsule Take 1 capsule by mouth 3 (three) times daily. Reported on 01/10/2016 01/02/16 01/12/16  Historical Provider, MD    Allergies:   Allergies  Allergen Reactions  . Bee Venom Anaphylaxis  . Penicillins Anaphylaxis, Hives and Other (See Comments)    Has patient had a PCN reaction causing immediate rash,  facial/tongue/throat swelling, SOB or lightheadedness with hypotension: Yes Has patient had a PCN reaction causing severe rash involving mucus membranes or skin necrosis: No Has patient had a PCN reaction that required hospitalization No Has patient had a PCN reaction occurring within the last 10 years: Yes If all of the above answers are "NO", then may proceed with Cephalosporin use.  . Ibuprofen Other (See Comments)    Reaction:  GI upset   . Tramadol Hives and Swelling  . Vancomycin Rash and Other (See Comments)    Reaction:  Red man's syndrome     Social History:  reports that he has been smoking Cigarettes.  He has been smoking about 1.50 packs per day. He does not have any smokeless tobacco history on file. He reports that he does not drink alcohol or use illicit drugs.  Family History: Family History  Problem Relation Age of Onset  . Cirrhosis Mother   . Diabetes Mellitus II Maternal Grandmother     Physical Exam: Filed Vitals:   01/10/16 2000 01/10/16 2030 01/10/16 2100 01/10/16 2130  BP: 123/74 128/70 132/81 132/65  Pulse: 111 110  107  Temp:      TempSrc:      Resp: Height:      Weight:      SpO2: 100% 100%  99%    General:  Alert and oriented times three, Cachectic male, no  acute distress, very sluggish male Eyes: PERRLA, pink conjunctiva, no scleral icterus ENT: Moist oral mucosa, neck supple, no thyromegaly, positive dental caries Lungs: clear to ascultation, no wheeze, no crackles, no use of accessory muscles Cardiovascular: regular rate and rhythm, no regurgitation, no gallops, no murmurs. No carotid bruits, no JVD, reproducible chest wall pain and right sided Abdomen: soft, positive BS, non-tender, non-distended, no organomegaly, not an acute abdomen GU: not examined Neuro: CN II - XII grossly intact, sensation intact Musculoskeletal: strength 5/5 all extremities, no clubbing, cyanosis or edema Skin: no rash, no subcutaneous crepitation, no  decubitus Psych: Sluggish male   Labs on Admission:   Recent Labs  01/10/16 1910  NA 132*  K 5.5*  CL 96*  CO2 <7*  GLUCOSE 806*  BUN 25*  CREATININE 1.73*  CALCIUM 9.4   No results for input(s): AST, ALT, ALKPHOS, BILITOT, PROT, ALBUMIN in the last 72 hours. No results for input(s): LIPASE, AMYLASE in the last 72 hours.  Recent Labs  01/10/16 1910  WBC 18.4*  NEUTROABS 14.5*  HGB 13.9  HCT 44.4  MCV 96.3  PLT 345   Results for [name], Tyreik B (MRN [id number]) as of 01/10/2016 23:10  Ref. Range 01/10/2016 19:25  Sample type Unknown VENOUS  pH, Ven Latest Ref Range: 7.320-7.430  7.04 (LL)  pCO2, Ven Latest Ref Range: 44.0-60.0 mmHg 20 (LL)  pO2, Ven Latest Ref Range: 31.0-45.0 mmHg 42.0  Bicarbonate Latest Ref Range: 21.0-28.0 mEq/L 5.4 (L)  Acid-base deficit Latest Ref Range: 0.0-2.0 mmol/L 23.6 (H)  O2 Saturation Latest Units: % 51.4  Patient temperature Unknown 37.0    No results for input(s): CKTOTAL, CKMB, CKMBINDEX, TROPONINI in the last 72 hours. Invalid input(s): POCBNP No results for input(s): DDIMER in the last 72 hours. No results for input(s): HGBA1C in the last 72 hours. No results for input(s): CHOL, HDL, LDLCALC, TRIG, CHOLHDL, LDLDIRECT in the last 72 hours. No results for input(s): TSH, T4TOTAL, T3FREE, THYROIDAB in the last 72 hours.  Invalid input(s): FREET3 No results for input(s): VITAMINB12, FOLATE, FERRITIN, TIBC, IRON, RETICCTPCT in the last 72 hours.  Micro Results: No results found for this or any previous visit (from the past 240 hour(s)).   Radiological Exams on Admission: No results found.  Assessment/Plan Present on Admission:  . DKA, type 1 (HCC) -Admit to ICU -DKA protocol  . Cocaine abuse -Aware, monitor  . Nausea & vomiting -Likely secondary to DKA. Antiemetics ordered when necessary  . Chest pain -Reproducible. Likely costochondritis from his coughing. -Will monitor in telemetry, order troponins in  a.m.  Hyperkalemia -Due to DKA, per DKA protocol  History of kidney injury -Repeat BMP in a.m. Dehydration due to DKA  Leukocytosis -Likely due to the margination but will order a UA and be sure with a chest x-ray ruling out infection. No antibiotics currently ordered  . Hepatitis C -Aware, monitor  . Tobacco abuse -Nicotine patch, duonebs  . Malnutrition of moderate degree (HCC) -Currently nothing by mouth due to DKA    Faige Seely 01/10/2016, 11:03 PM

## 2016-01-10 NOTE — ED Provider Notes (Signed)
[facility] Emergency Department Provider Note    ____________________________________________  Time seen: On EMS arrival  I have reviewed the triage vital signs and the nursing notes.   HISTORY  Chief Complaint Hyperglycemia   History limited by: Altered Mental Status   HPI Trevor Brown is a 25 y.o. male with history of type 1 diabetes, history of DKA and medication noncompliance, who presents to the emergency department via EMS. EMS state that they were called to his house by friend because of complaints for chest pain. For EMS the patient did complain of some central chest pain. On my exam patient did not complain of chest pain however was unable  to answer questions about chest pain. Last about insulin use he told me that he last used 2 weeks ago. He states he ran out. The history was severely limited by altered mental status.   Past Medical History  Diagnosis Date  . Diabetes mellitus without complication (HCC)   . Hepatitis C, acute may 2016  . Heart attack (HCC)     Pt claims he had heart attack a year ago ( 2015)- and was admitted in [facility] for that, but not given any meds or angiogram, on review of chart- I could not find any details like that.    Patient Active Problem List   Diagnosis Date Noted  . Hyponatremia 12/12/2015  . Type 1 diabetes mellitus with hyperglycemia (HCC) 12/12/2015  . Chest pain 12/12/2015  . Elevated troponin 12/12/2015  . Cocaine abuse 12/12/2015  . DKA (diabetic ketoacidoses) (HCC) 06/10/2015  . Hidradenitis suppurativa of left axilla   . DKA, type 1 (HCC) 06/06/2015  . Malnutrition of moderate degree (HCC) 04/08/2015  . Hepatitis C 12/13/2014  . Diabetes type 1, uncontrolled (HCC) 12/11/2014  . Major depressive disorder, single episode, mild (HCC)   . Major depression, single episode 12/10/2014  . Diabetes mellitus type 1, uncontrolled (HCC) 12/09/2014  . Transaminitis 12/09/2014  . Folliculitis 12/09/2014  .  Homelessness 12/09/2014    No past surgical history on file.  Current Outpatient Rx  Name  Route  Sig  Dispense  Refill  . insulin aspart protamine- aspart (NOVOLOG MIX 70/30) (70-30) 100 UNIT/ML injection   Subcutaneous   Inject 0.24 mLs (24 Units total) into the skin 2 (two) times daily with a meal.   10 mL   11   . pantoprazole (PROTONIX) 40 MG tablet   Oral   Take 1 tablet (40 mg total) by mouth daily.   30 tablet   0     Allergies Bee venom; Penicillins; Ibuprofen; Tramadol; and Vancomycin  Family History  Problem Relation Age of Onset  . Cirrhosis Mother   . Diabetes Mellitus II Maternal Grandmother     Social History Social History  Substance Use Topics  . Smoking status: Current Every Day Smoker -- 1.50 packs/day    Types: Cigarettes  . Smokeless tobacco: Not on file  . Alcohol Use: No    Review of Systems Unable to obtain secondary to altered mental status ____________________________________________   PHYSICAL EXAM:  VITAL SIGNS:   97.2   108   33  135/83 mmHg  92 %     Constitutional: Chronically ill-appearing. Cachectic. Track marks on bilateral upper extremities. Somnolent however will arouse and try to speak with verbal stimulation. Eyes: Conjunctivae are normal. PERRL. slightly disconjugate gaze. ENT   Head: Normocephalic and atraumatic.   Nose: No congestion/rhinnorhea.   Mouth/Throat: Mucous membranes are moist.  Neck: No stridor. Hematological/Lymphatic/Immunilogical: No cervical lymphadenopathy. Cardiovascular: Tachycardic, regular rhythm.  No murmurs, rubs, or gallops. Respiratory: Increased respiratory effort with deep breaths and tachypnea. No crackles or rhonchi appreciated.  Gastrointestinal: Soft and nontender. No distention.  Genitourinary: Deferred Musculoskeletal: Normal range of motion in all extremities. No joint effusions.  No lower extremity tenderness nor edema. Neurologic:  Somnolent however will arouse to  verbal stimuli. Will attempt to answer questions however somewhat muffled speech. Does appear to be moving all extremities. Skin:  Skin is warm, dry and intact. No rash noted. Multiple track marks noted on bilateral upper extremities   ____________________________________________    LABS (pertinent positives/negatives)  Labs Reviewed  GLUCOSE, CAPILLARY - Abnormal; Notable for the following:    Glucose-Capillary >600 (*)    All other components within normal limits  BASIC METABOLIC PANEL - Abnormal; Notable for the following:    Sodium 132 (*)    Potassium 5.5 (*)    Chloride 96 (*)    CO2 <7 (*)    Glucose, Bld 806 (*)    BUN 25 (*)    Creatinine, Ser 1.73 (*)    GFR calc non Af Amer 54 (*)    All other components within normal limits  CBC WITH DIFFERENTIAL/PLATELET - Abnormal; Notable for the following:    WBC 18.4 (*)    MCHC 31.4 (*)    Neutro Abs 14.5 (*)    Monocytes Absolute 1.5 (*)    All other components within normal limits  BETA-HYDROXYBUTYRIC ACID - Abnormal; Notable for the following:    Beta-Hydroxybutyric Acid >8.00 (*)    All other components within normal limits  URINALYSIS COMPLETEWITH MICROSCOPIC ([facility] ONLY) - Abnormal; Notable for the following:    Color, Urine COLORLESS (*)    APPearance CLEAR (*)    Glucose, UA >500 (*)    Ketones, ur 2+ (*)    Hgb urine dipstick 1+ (*)    All other components within normal limits  BLOOD GAS, VENOUS - Abnormal; Notable for the following:    pH, Ven 7.04 (*)    pCO2, Ven 20 (*)    Bicarbonate 5.4 (*)    Acid-base deficit 23.6 (*)    All other components within normal limits  GLUCOSE, CAPILLARY - Abnormal; Notable for the following:    Glucose-Capillary 572 (*)    All other components within normal limits  GLUCOSE, CAPILLARY - Abnormal; Notable for the following:    Glucose-Capillary 508 (*)    All other components within normal limits  TROPONIN I      ____________________________________________   EKG  I, Phineas Semen, attending physician, personally viewed and interpreted this EKG  EKG Time: 1909 Rate: 114 Rhythm: sinus tachycardia Axis: normal Intervals: qtc 441 QRS: narrow, LVH ST changes: no st elevation Impression: abnormal ekg   ____________________________________________    RADIOLOGY  None  ____________________________________________   PROCEDURES  Procedure(s) performed: None  Critical Care performed: Yes, see critical care note(s)  CRITICAL CARE Performed by: Phineas Semen   Total critical care time: 35 minutes  Critical care time was exclusive of separately billable procedures and treating other patients.  Critical care was necessary to treat or prevent imminent or life-threatening deterioration.  Critical care was time spent personally by me on the following activities: development of treatment plan with patient and/or surrogate as well as nursing, discussions with consultants, evaluation of patient's response to treatment, examination of patient, obtaining history from patient or surrogate, ordering and performing treatments and interventions,  ordering and review of laboratory studies, ordering and review of radiographic studies, pulse oximetry and re-evaluation of patient's condition.  ____________________________________________   INITIAL IMPRESSION / ASSESSMENT AND PLAN / ED COURSE  Pertinent labs & imaging results that were available during my care of the patient were reviewed by me and considered in my medical decision making (see chart for details).  Patient presented to the emergency department today via EMS. Per EMS he was complaining of chest pain to a friend. On exam however I do have concerns primarily for DKA. I personally evaluated the patient for similar symptoms 2 months ago. The patient does say that he is not uses insulin in 2 weeks. His tachypnea and tachycardia  would suggest hyperglycemia and fingerstick was greater than 600. Will give fluids. Will await potassium prior to initiating insulin.    Patient's potassium 5.5. Patient was started on insulin drip. Patient's lab work and findings consistent with diabetic ketoacidosis. The patient was admitted to the hospitalist service for further workup and evaluation.  ____________________________________________   FINAL CLINICAL IMPRESSION(S) / ED DIAGNOSES  Final diagnoses:  Diabetic ketoacidosis without coma associated with type 1 diabetes mellitus (HCC)     Note: This dictation was prepared with Dragon dictation. Any transcriptional errors that result from this process are unintentional    Phineas SemenGraydon Savyon Loken, MD 01/10/16 [phone]

## 2016-01-11 LAB — BASIC METABOLIC PANEL
Anion gap: 14 (ref 5–15)
Anion gap: 11 (ref 5–15)
Anion gap: 10 (ref 5–15)
Anion gap: 12 (ref 5–15)
Anion gap: 9 (ref 5–15)
BUN: 18 mg/dL (ref 6–20)
BUN: 18 mg/dL (ref 6–20)
BUN: 16 mg/dL (ref 6–20)
BUN: 14 mg/dL (ref 6–20)
BUN: 11 mg/dL (ref 6–20)
CO2: 11 mmol/L — ABNORMAL LOW (ref 22–32)
CO2: 13 mmol/L — ABNORMAL LOW (ref 22–32)
CO2: 15 mmol/L — ABNORMAL LOW (ref 22–32)
CO2: 17 mmol/L — ABNORMAL LOW (ref 22–32)
CO2: 19 mmol/L — ABNORMAL LOW (ref 22–32)
Calcium: 8.3 mg/dL — ABNORMAL LOW (ref 8.9–10.3)
Calcium: 8.1 mg/dL — ABNORMAL LOW (ref 8.9–10.3)
Calcium: 8.3 mg/dL — ABNORMAL LOW (ref 8.9–10.3)
Calcium: 8 mg/dL — ABNORMAL LOW (ref 8.9–10.3)
Calcium: 8.2 mg/dL — ABNORMAL LOW (ref 8.9–10.3)
Chloride: 119 mmol/L — ABNORMAL HIGH (ref 101–111)
Chloride: 117 mmol/L — ABNORMAL HIGH (ref 101–111)
Chloride: 113 mmol/L — ABNORMAL HIGH (ref 101–111)
Chloride: 102 mmol/L (ref 101–111)
Chloride: 103 mmol/L (ref 101–111)
Creatinine, Ser: 1.19 mg/dL (ref 0.61–1.24)
Creatinine, Ser: 0.85 mg/dL (ref 0.61–1.24)
Creatinine, Ser: 0.94 mg/dL (ref 0.61–1.24)
Creatinine, Ser: 0.78 mg/dL (ref 0.61–1.24)
Creatinine, Ser: 0.74 mg/dL (ref 0.61–1.24)
GFR calc Af Amer: >60 mL/min (ref >60)
GFR calc Af Amer: >60 mL/min (ref >60)
GFR calc Af Amer: >60 mL/min (ref >60)
GFR calc Af Amer: >60 mL/min (ref >60)
GFR calc Af Amer: >60 mL/min (ref >60)
GFR calc non Af Amer: >60 mL/min (ref >60)
GFR calc non Af Amer: >60 mL/min (ref >60)
GFR calc non Af Amer: >60 mL/min (ref >60)
GFR calc non Af Amer: >60 mL/min (ref >60)
GFR calc non Af Amer: >60 mL/min (ref >60)
Glucose, Bld: 183 mg/dL — ABNORMAL HIGH (ref 65–99)
Glucose, Bld: 151 mg/dL — ABNORMAL HIGH (ref 65–99)
Glucose, Bld: 163 mg/dL — ABNORMAL HIGH (ref 65–99)
Glucose, Bld: 302 mg/dL — ABNORMAL HIGH (ref 65–99)
Glucose, Bld: 89 mg/dL (ref 65–99)
Potassium: 3.5 mmol/L (ref 3.5–5.1)
Potassium: 3.7 mmol/L (ref 3.5–5.1)
Potassium: 3.3 mmol/L — ABNORMAL LOW (ref 3.5–5.1)
Potassium: 2.7 mmol/L — CL (ref 3.5–5.1)
Potassium: 3.2 mmol/L — ABNORMAL LOW (ref 3.5–5.1)
Sodium: 144 mmol/L (ref 135–145)
Sodium: 141 mmol/L (ref 135–145)
Sodium: 138 mmol/L (ref 135–145)
Sodium: 131 mmol/L — ABNORMAL LOW (ref 135–145)
Sodium: 131 mmol/L — ABNORMAL LOW (ref 135–145)

## 2016-01-11 LAB — GLUCOSE, CAPILLARY
Glucose-Capillary: 126 mg/dL — ABNORMAL HIGH (ref 65–99)
Glucose-Capillary: 135 mg/dL — ABNORMAL HIGH (ref 65–99)
Glucose-Capillary: 135 mg/dL — ABNORMAL HIGH (ref 65–99)
Glucose-Capillary: 139 mg/dL — ABNORMAL HIGH (ref 65–99)
Glucose-Capillary: 141 mg/dL — ABNORMAL HIGH (ref 65–99)
Glucose-Capillary: 149 mg/dL — ABNORMAL HIGH (ref 65–99)
Glucose-Capillary: 155 mg/dL — ABNORMAL HIGH (ref 65–99)
Glucose-Capillary: 158 mg/dL — ABNORMAL HIGH (ref 65–99)
Glucose-Capillary: 185 mg/dL — ABNORMAL HIGH (ref 65–99)
Glucose-Capillary: 202 mg/dL — ABNORMAL HIGH (ref 65–99)
Glucose-Capillary: 205 mg/dL — ABNORMAL HIGH (ref 65–99)
Glucose-Capillary: 209 mg/dL — ABNORMAL HIGH (ref 65–99)
Glucose-Capillary: 274 mg/dL — ABNORMAL HIGH (ref 65–99)
Glucose-Capillary: 334 mg/dL — ABNORMAL HIGH (ref 65–99)
Glucose-Capillary: 336 mg/dL — ABNORMAL HIGH (ref 65–99)
Glucose-Capillary: 360 mg/dL — ABNORMAL HIGH (ref 65–99)
Glucose-Capillary: 382 mg/dL — ABNORMAL HIGH (ref 65–99)
Glucose-Capillary: 90 mg/dL (ref 65–99)

## 2016-01-11 LAB — TROPONIN I: Troponin I: <0.03 ng/mL (ref <0.031)

## 2016-01-11 LAB — CBC
HCT: 37.4 % — ABNORMAL LOW (ref 40.0–52.0)
HCT: 34.7 % — ABNORMAL LOW (ref 40.0–52.0)
Hemoglobin: 12.6 g/dL — ABNORMAL LOW (ref 13.0–18.0)
Hemoglobin: 12 g/dL — ABNORMAL LOW (ref 13.0–18.0)
MCH: 30.2 pg (ref 26.0–34.0)
MCH: 30.3 pg (ref 26.0–34.0)
MCHC: 33.8 g/dL (ref 32.0–36.0)
MCHC: 34.6 g/dL (ref 32.0–36.0)
MCV: 89.4 fL (ref 80.0–100.0)
MCV: 87.7 fL (ref 80.0–100.0)
Platelets: 288 10*3/uL (ref 150–440)
Platelets: 268 10*3/uL (ref 150–440)
RBC: 4.18 MIL/uL — ABNORMAL LOW (ref 4.40–5.90)
RBC: 3.96 MIL/uL — ABNORMAL LOW (ref 4.40–5.90)
RDW: 12.3 % (ref 11.5–14.5)
RDW: 12.4 % (ref 11.5–14.5)
WBC: 18.7 10*3/uL — ABNORMAL HIGH (ref 3.8–10.6)
WBC: 12.6 10*3/uL — ABNORMAL HIGH (ref 3.8–10.6)

## 2016-01-11 LAB — MAGNESIUM: Magnesium: 2.1 mg/dL (ref 1.7–2.4)

## 2016-01-11 LAB — MRSA PCR SCREENING: MRSA by PCR: POSITIVE — AB

## 2016-01-11 MED ORDER — MUPIROCIN 2 % EX OINT
1.0000 "application " | TOPICAL_OINTMENT | Freq: Two times a day (BID) | CUTANEOUS | Status: DC
  Administered 2016-01-11: 1 via NASAL
  Filled 2016-01-11 (×2): qty 22

## 2016-01-11 MED ORDER — ENOXAPARIN SODIUM 40 MG/0.4ML ~~LOC~~ SOLN
40.0000 mg | Freq: Every day | SUBCUTANEOUS | Status: DC
  Administered 2016-01-11: 40 mg via SUBCUTANEOUS
  Filled 2016-01-11: qty 0.4

## 2016-01-11 MED ORDER — INSULIN GLARGINE 100 UNIT/ML ~~LOC~~ SOLN
16.0000 [IU] | SUBCUTANEOUS | Status: DC
  Filled 2016-01-11: qty 0.16

## 2016-01-11 MED ORDER — INSULIN ASPART 100 UNIT/ML ~~LOC~~ SOLN: 0.0000 [IU] | Freq: Three times a day (TID) | SUBCUTANEOUS | Status: DC

## 2016-01-11 MED ORDER — POTASSIUM CHLORIDE 10 MEQ/100ML IV SOLN
10.0000 meq | INTRAVENOUS | Status: DC
  Administered 2016-01-11: 10 meq via INTRAVENOUS
  Filled 2016-01-11 (×4): qty 100

## 2016-01-11 MED ORDER — ACETAMINOPHEN 325 MG PO TABS
650.0000 mg | ORAL_TABLET | Freq: Four times a day (QID) | ORAL | Status: DC | PRN
  Administered 2016-01-11: 650 mg via ORAL
  Filled 2016-01-11: qty 2

## 2016-01-11 MED ORDER — ONDANSETRON HCL 4 MG PO TABS: 4.0000 mg | ORAL_TABLET | Freq: Four times a day (QID) | ORAL | Status: DC | PRN

## 2016-01-11 MED ORDER — SODIUM CHLORIDE 0.9 % IV SOLN
INTRAVENOUS | Status: DC
  Administered 2016-01-11: 01:00:00 via INTRAVENOUS

## 2016-01-11 MED ORDER — KETOROLAC TROMETHAMINE 15 MG/ML IJ SOLN
15.0000 mg | Freq: Four times a day (QID) | INTRAMUSCULAR | Status: DC | PRN
  Administered 2016-01-11 (×2): 15 mg via INTRAVENOUS
  Filled 2016-01-11 (×2): qty 1

## 2016-01-11 MED ORDER — INSULIN ASPART 100 UNIT/ML ~~LOC~~ SOLN: 0.0000 [IU] | Freq: Every day | SUBCUTANEOUS | Status: DC

## 2016-01-11 MED ORDER — ACETAMINOPHEN 650 MG RE SUPP: 650.0000 mg | Freq: Four times a day (QID) | RECTAL | Status: DC | PRN

## 2016-01-11 MED ORDER — CHLORHEXIDINE GLUCONATE CLOTH 2 % EX PADS: 6.0000 | MEDICATED_PAD | Freq: Every day | CUTANEOUS | Status: DC

## 2016-01-11 MED ORDER — CLINDAMYCIN HCL 150 MG PO CAPS
300.0000 mg | ORAL_CAPSULE | Freq: Three times a day (TID) | ORAL | Status: DC
  Administered 2016-01-11: 300 mg via ORAL
  Filled 2016-01-11: qty 2

## 2016-01-11 MED ORDER — POTASSIUM CHLORIDE IN NACL 20-0.45 MEQ/L-% IV SOLN
INTRAVENOUS | Status: DC
  Administered 2016-01-11: 14:00:00 via INTRAVENOUS
  Filled 2016-01-11 (×4): qty 1000

## 2016-01-11 MED ORDER — SODIUM CHLORIDE 0.9 % IV SOLN
INTRAVENOUS | Status: DC
  Filled 2016-01-11: qty 2.5

## 2016-01-11 MED ORDER — PANTOPRAZOLE SODIUM 40 MG PO TBEC
40.0000 mg | DELAYED_RELEASE_TABLET | Freq: Every day | ORAL | Status: DC
  Administered 2016-01-11: 40 mg via ORAL
  Filled 2016-01-11: qty 1

## 2016-01-11 MED ORDER — ONDANSETRON HCL 4 MG/2ML IJ SOLN: 4.0000 mg | Freq: Four times a day (QID) | INTRAMUSCULAR | Status: DC | PRN

## 2016-01-11 MED ORDER — POTASSIUM CHLORIDE CRYS ER 20 MEQ PO TBCR
40.0000 meq | EXTENDED_RELEASE_TABLET | ORAL | Status: AC
Start: 2016-01-11 — End: 2016-01-11
  Administered 2016-01-11: 40 meq via ORAL
  Filled 2016-01-11 (×2): qty 2

## 2016-01-11 MED ORDER — DEXTROSE-NACL 5-0.45 % IV SOLN
INTRAVENOUS | Status: DC
  Administered 2016-01-11 (×2): via INTRAVENOUS

## 2016-01-11 MED ORDER — PANTOPRAZOLE SODIUM 40 MG PO TBEC
40.0000 mg | DELAYED_RELEASE_TABLET | Freq: Two times a day (BID) | ORAL | Status: DC
  Administered 2016-01-11: 40 mg via ORAL
  Filled 2016-01-11: qty 1

## 2016-01-11 MED ORDER — OXYCODONE HCL 5 MG PO TABS
5.0000 mg | ORAL_TABLET | Freq: Four times a day (QID) | ORAL | Status: DC | PRN
  Administered 2016-01-11: 5 mg via ORAL
  Filled 2016-01-11: qty 1

## 2016-01-11 MED ORDER — SODIUM CHLORIDE 0.9% FLUSH
3.0000 mL | Freq: Two times a day (BID) | INTRAVENOUS | Status: DC
  Administered 2016-01-11: 3 mL via INTRAVENOUS

## 2016-01-11 MED ORDER — ALUM & MAG HYDROXIDE-SIMETH 200-200-20 MG/5ML PO SUSP
30.0000 mL | Freq: Four times a day (QID) | ORAL | Status: DC | PRN
  Administered 2016-01-11: 30 mL via ORAL
  Filled 2016-01-11: qty 30

## 2016-01-11 NOTE — Care Management (Signed)
Staff at medication management states patient has been getting medication from them. Insulin last filled 10/26/2015. He received a 30 day supply.

## 2016-01-11 NOTE — Progress Notes (Signed)
Notified Bin[place], NP of patient need to transition off insulin drip.  CO2 level 19 now.  She is in the process at this time to place orders for the transition.

## 2016-01-11 NOTE — Progress Notes (Signed)
Notified Dr. Elpidio AnisSudini about patient anion gap closed at 11, but CO2 13. Patient complaint of pain in chest but states he can not describe it and that many things make his chest hurt- but will not explain further.  Patient proceeds to fall back to sleep.  He did state that the tylenol he received last night did not help with his pain.  Also patient would like to eat a diet.  Dr. Elpidio AnisSudini ordered for diabetic diet and toradol for pain.

## 2016-01-11 NOTE — Progress Notes (Signed)
PT lying in bed noncompliant and pulling off all medical equipment . Requesting to leave facility and going to [facility] for cont. Treatment. It was recommended to patient that he remains here to continue treatment for his diabetes,and he stated that it nothing personal and he has family that he has to go and see there. Dr. Joneen Roachrosley notified of patient wanting to sign out AMA paper work being signed and patient getting dressed . Iv's discontinued awaiting for ride. Pt walked out at 1955.with his personal belongings in hand.

## 2016-01-11 NOTE — Progress Notes (Signed)
Notified Dr. Clint GuyHower that patient is ready to be transitioned off insulin drip.  Anion Gap closed and CO2 19.  He stated that he would place orders for the transition.

## 2016-01-11 NOTE — Progress Notes (Signed)
Glucose controlled, AG closed, will place transition orders

## 2016-01-11 NOTE — Care Management (Addendum)
Well known to case management due to readmission and noncompliance. Positive for cocaine this admission. Spoke with [place] Sinkita at medication management regarding patient. He is active with clinic. They can fill his insulin at discharge but RNCM needs to call to verify they have what he is prescribed. They did have lantus and Novolin on hand.  Currently on a insulin gtt.

## 2016-01-11 NOTE — Progress Notes (Signed)
Inpatient Diabetes Program Recommendations  AACE/ADA: New Consensus Statement on Inpatient Glycemic Control (2015)  Target Ranges:  Prepandial:   less than 140 mg/dL      Peak postprandial:   less than 180 mg/dL (1-2 hours)      Critically ill patients:  140 - 180 mg/dL   Lab Results  Component Value Date   GLUCAP 158* 01/11/2016   HGBA1C 12.7* 12/12/2015    Review of Glycemic Control  Results for Trevor Brown, Keenen B (MRN [id number]) as of 01/11/2016 08:33  Ref. Range 01/11/2016 03:51 01/11/2016 04:55 01/11/2016 05:46 01/11/2016 06:44 01/11/2016 07:45  Glucose-Capillary Latest Ref Range: 65-99 mg/dL 409202 (H) 811135 (H) 914139 (H) 155 (H) 158 (H)    Diabetes history: Type 1 Outpatient Diabetes medications: Novolog 70/30 24 units bid- not taken in 2 weeks Current orders for Inpatient glycemic control: DKA order set   Inpatient Diabetes Program Recommendations:   IV insulin infusion with sufficient glucose should be continued until acidosis is corrected as determined by MD Venous CO2=20, normal anion gap (8-12), negative ketones   While on IV insulin, if patient is eating cover carbohydrate (CHO) intake using IV insulin/CHO coverage via Glucostabilizer (1 unit for every 10 grams of CHO)  Transition to SQ insulin-MD will initiate Phase 2 of DKA Protocol for transition off insulin drip.  RN discontinue insulin drip 2 hours after subcutaneous basal insulin given and simultaneously give Novolog correction scale.  Susette RacerJulie Zela Sobieski, RN, BA, MHA, CDE Diabetes Coordinator Inpatient Diabetes Program  [phone] (Team Pager) [phone] [facility] Office) 01/11/2016 8:41 AM

## 2016-01-11 NOTE — Progress Notes (Signed)
Notfied Dr. Elpidio AnisSudini of most current labs- Potassium 2.7, sodium unreadable and CO2 17.  Lab asked if they could redraw- I stated that they could- still waiting on pending lab work.  Patient stating that he still is in pain- orders for oxycodone 5mg  q6 prn and once patient's CO2 level is 18 and above- then he can transition off the insulin drip.

## 2016-01-11 NOTE — Progress Notes (Signed)
[facility] [place] [facility]   PATIENT NAME: Ervin Knackony Biasi    MR#:  [id number]  DATE OF BIRTH:  11/06/1990  SUBJECTIVE:  CHIEF COMPLAINT:   Chief Complaint  Patient presents with  . Hyperglycemia   On insulin drip Right pleuritic chest pain with coughing  REVIEW OF SYSTEMS:    Review of Systems  Constitutional: Positive for malaise/fatigue. Negative for fever and weight loss.  Respiratory: Positive for cough and sputum production.   Cardiovascular: Positive for chest pain.  Gastrointestinal: Negative for nausea and abdominal pain.  Genitourinary: Negative for dysuria.  Musculoskeletal: Negative for myalgias and back pain.  Neurological: Negative for sensory change, focal weakness and headaches.    DRUG ALLERGIES:   Allergies  Allergen Reactions  . Bee Venom Anaphylaxis  . Penicillins Anaphylaxis, Hives and Other (See Comments)    Has patient had a PCN reaction causing immediate rash, facial/tongue/throat swelling, SOB or lightheadedness with hypotension: Yes Has patient had a PCN reaction causing severe rash involving mucus membranes or skin necrosis: No Has patient had a PCN reaction that required hospitalization No Has patient had a PCN reaction occurring within the last 10 years: Yes If all of the above answers are "NO", then may proceed with Cephalosporin use.  . Ibuprofen Other (See Comments)    Reaction:  GI upset   . Tramadol Hives and Swelling  . Vancomycin Rash and Other (See Comments)    Reaction:  Red man's syndrome     VITALS:  Blood pressure 121/68, pulse 66, temperature 97.9 F (36.6 C), temperature source Axillary, resp. rate 19, height 5\' 8"  (1.727 m), weight 54.432 kg (120 lb), SpO2 100 %.  PHYSICAL EXAMINATION:   Physical Exam  GENERAL:  25 y.o.-year-old patient lying in the bed with no acute distress.  EYES: Pupils equal, round, reactive to light and accommodation. No scleral icterus. Extraocular muscles intact.   HEENT: Head atraumatic, normocephalic. Oropharynx and nasopharynx clear.  NECK:  Supple, no jugular venous distention. No thyroid enlargement, no tenderness.  LUNGS: Normal breath sounds bilaterally, no wheezing, rales, rhonchi. No use of accessory muscles of respiration.  CARDIOVASCULAR: S1, S2 normal. No murmurs, rubs, or gallops.  ABDOMEN: Soft, nontender, nondistended. Bowel sounds present. No organomegaly or mass.  EXTREMITIES: No cyanosis, clubbing or edema b/l.    NEUROLOGIC: Cranial nerves II through XII are intact. No focal Motor or sensory deficits b/l.   PSYCHIATRIC: The patient is alert and oriented x 3.  SKIN: No obvious rash, lesion, or ulcer.   LABORATORY PANEL:   CBC  Recent Labs Lab 01/11/16 0657  WBC 12.6*  HGB 12.0*  HCT 34.7*  PLT 268   ------------------------------------------------------------------------------------------------------------------ Chemistries   Recent Labs Lab 01/11/16 0210  01/11/16 1355  NA 144  < > 131*  K 3.5  < > 2.7*  CL 119*  < > 102  CO2 11*  < > 17*  GLUCOSE 183*  < > 302*  BUN 18  < > 14  CREATININE 1.19  < > 0.78  CALCIUM 8.3*  < > 8.0*  MG 2.1  --   --   < > = values in this interval not displayed. ------------------------------------------------------------------------------------------------------------------  Cardiac Enzymes  Recent Labs Lab 01/10/16 1910  TROPONINI <0.03   ------------------------------------------------------------------------------------------------------------------  RADIOLOGY:  Dg Chest 1 View  01/10/2016  CLINICAL DATA:  Acute onset of leukocytosis.  Initial encounter. EXAM: CHEST 1 VIEW COMPARISON:  Chest radiograph performed 06/06/2015 FINDINGS: The lungs are well-aerated and  clear. There is no evidence of focal opacification, pleural effusion or pneumothorax. The cardiomediastinal silhouette is within normal limits. No acute osseous abnormalities are seen. IMPRESSION: No acute  cardiopulmonary process seen. Electronically Signed   By: Roanna RaiderJeffery  Chang M.D.   On: 01/10/2016 23:50     ASSESSMENT AND PLAN:   * DKA due to non complicance ON insulin drip IVF Accuchecks Q1hr BMP Q 4 hrs Once bicarb > 18 can change to 70/30 at 24 units BIDWM  * GERD  All the records are reviewed and case discussed with Care Management/Social Workerr. Management plans discussed with the patient, family and they are in agreement.  CODE STATUS: FULL CODE  DVT Prophylaxis: SCDs  TOTAL TIME TAKING CARE OF THIS PATIENT: 35 minutes.   POSSIBLE D/C IN 1-2 DAYS, DEPENDING ON CLINICAL CONDITION.  Milagros LollSudini, Gid Schoffstall R M.D on 01/11/2016 at 5:05 PM  Between 7am to 6pm - Pager - [phone]  After 6pm go to [email or web address] - password [email or web address] [facility]  [facility]  Office  [phone]  CC: Primary care physician; No PCP Per Patient  Note: This dictation was prepared with Dragon dictation along with smaller phrase technology. Any transcriptional errors that result from this process are unintentional.

## 2016-01-11 NOTE — Progress Notes (Signed)
Pt's BMET results called to Dr. Tobi BastosPyreddy. No new orders given at this time. Pt remains on insulin drip. Will continue to monitor.

## 2016-01-11 NOTE — Progress Notes (Signed)
eLink Physician-Brief Progress Note Patient Name: Trevor Brown DOB: 01-25-1991 MRN: [id number]   Date of Service  01/11/2016  HPI/Events of Note  ELink New patient eval: 25 yo M with hx of Type 1 DM, now with N\V, UDS positive for cocaine, found to be in DKA  eICU Interventions  Insulin gtt May use benzos bp control DKA protocol Management per primary team     Intervention Category Evaluation Type: New Patient Evaluation  Rhylee Pucillo 01/11/2016, 1:02 AM

## 2016-01-12 ENCOUNTER — Encounter: Payer: Self-pay | Admitting: Emergency Medicine

## 2016-01-12 ENCOUNTER — Emergency Department: Payer: Self-pay

## 2016-01-12 ENCOUNTER — Inpatient Hospital Stay
Admission: EM | Admit: 2016-01-12 | Discharge: 2016-01-15 | DRG: 638 | Disposition: A | Discharge status: Home / Self Care | Payer: Self-pay | Attending: Internal Medicine | Admitting: Internal Medicine

## 2016-01-12 DIAGNOSIS — Z720 Tobacco use: Secondary | ICD-10-CM | POA: Diagnosis present

## 2016-01-12 DIAGNOSIS — Z833 Family history of diabetes mellitus: Secondary | ICD-10-CM

## 2016-01-12 DIAGNOSIS — E876 Hypokalemia: Secondary | ICD-10-CM | POA: Diagnosis present

## 2016-01-12 DIAGNOSIS — E1065 Type 1 diabetes mellitus with hyperglycemia: Secondary | ICD-10-CM

## 2016-01-12 DIAGNOSIS — E1110 Type 2 diabetes mellitus with ketoacidosis without coma: Secondary | ICD-10-CM | POA: Diagnosis present

## 2016-01-12 DIAGNOSIS — F17210 Nicotine dependence, cigarettes, uncomplicated: Secondary | ICD-10-CM | POA: Diagnosis present

## 2016-01-12 DIAGNOSIS — L03113 Cellulitis of right upper limb: Secondary | ICD-10-CM | POA: Diagnosis present

## 2016-01-12 DIAGNOSIS — D72829 Elevated white blood cell count, unspecified: Secondary | ICD-10-CM

## 2016-01-12 DIAGNOSIS — E860 Dehydration: Secondary | ICD-10-CM

## 2016-01-12 DIAGNOSIS — T383X6A Underdosing of insulin and oral hypoglycemic [antidiabetic] drugs, initial encounter: Secondary | ICD-10-CM | POA: Diagnosis present

## 2016-01-12 DIAGNOSIS — Z9119 Patient's noncompliance with other medical treatment and regimen: Secondary | ICD-10-CM

## 2016-01-12 DIAGNOSIS — E871 Hypo-osmolality and hyponatremia: Secondary | ICD-10-CM | POA: Diagnosis present

## 2016-01-12 DIAGNOSIS — M6080 Other myositis, unspecified site: Secondary | ICD-10-CM

## 2016-01-12 DIAGNOSIS — F1410 Cocaine abuse, uncomplicated: Secondary | ICD-10-CM | POA: Diagnosis present

## 2016-01-12 DIAGNOSIS — E222 Syndrome of inappropriate secretion of antidiuretic hormone: Secondary | ICD-10-CM | POA: Diagnosis present

## 2016-01-12 DIAGNOSIS — IMO0001 Reserved for inherently not codable concepts without codable children: Secondary | ICD-10-CM

## 2016-01-12 DIAGNOSIS — Z22322 Carrier or suspected carrier of Methicillin resistant Staphylococcus aureus: Secondary | ICD-10-CM

## 2016-01-12 DIAGNOSIS — D649 Anemia, unspecified: Secondary | ICD-10-CM | POA: Diagnosis present

## 2016-01-12 DIAGNOSIS — Z880 Allergy status to penicillin: Secondary | ICD-10-CM

## 2016-01-12 DIAGNOSIS — Z91030 Bee allergy status: Secondary | ICD-10-CM

## 2016-01-12 DIAGNOSIS — G8929 Other chronic pain: Secondary | ICD-10-CM | POA: Diagnosis present

## 2016-01-12 DIAGNOSIS — Z91128 Patient's intentional underdosing of medication regimen for other reason: Secondary | ICD-10-CM

## 2016-01-12 DIAGNOSIS — R609 Edema, unspecified: Secondary | ICD-10-CM

## 2016-01-12 DIAGNOSIS — Z881 Allergy status to other antibiotic agents status: Secondary | ICD-10-CM

## 2016-01-12 DIAGNOSIS — K029 Dental caries, unspecified: Secondary | ICD-10-CM | POA: Diagnosis present

## 2016-01-12 DIAGNOSIS — Y929 Unspecified place or not applicable: Secondary | ICD-10-CM

## 2016-01-12 DIAGNOSIS — E1010 Type 1 diabetes mellitus with ketoacidosis without coma: Principal | ICD-10-CM

## 2016-01-12 DIAGNOSIS — R079 Chest pain, unspecified: Secondary | ICD-10-CM

## 2016-01-12 DIAGNOSIS — M60031 Infective myositis, right forearm: Secondary | ICD-10-CM | POA: Diagnosis present

## 2016-01-12 LAB — URINE DRUG SCREEN, QUALITATIVE (ARMC ONLY)
Amphetamines, Ur Screen: NOT DETECTED
Barbiturates, Ur Screen: NOT DETECTED
Benzodiazepine, Ur Scrn: NOT DETECTED
Cannabinoid 50 Ng, Ur ~~LOC~~: NOT DETECTED
Cocaine Metabolite,Ur ~~LOC~~: POSITIVE — AB
MDMA (Ecstasy)Ur Screen: NOT DETECTED
Methadone Scn, Ur: NOT DETECTED
Opiate, Ur Screen: NOT DETECTED
Phencyclidine (PCP) Ur S: NOT DETECTED
Tricyclic, Ur Screen: NOT DETECTED

## 2016-01-12 LAB — COMPREHENSIVE METABOLIC PANEL
ALT: 33 U/L (ref 17–63)
AST: 29 U/L (ref 15–41)
Albumin: 4 g/dL (ref 3.5–5.0)
Alkaline Phosphatase: 204 U/L — ABNORMAL HIGH (ref 38–126)
BUN: 16 mg/dL (ref 6–20)
CO2: <7 mmol/L — ABNORMAL LOW (ref 22–32)
Calcium: 8.5 mg/dL — ABNORMAL LOW (ref 8.9–10.3)
Chloride: 101 mmol/L (ref 101–111)
Creatinine, Ser: 1.2 mg/dL (ref 0.61–1.24)
GFR calc Af Amer: >60 mL/min (ref >60)
GFR calc non Af Amer: >60 mL/min (ref >60)
Glucose, Bld: 542 mg/dL (ref 65–99)
Potassium: 4.6 mmol/L (ref 3.5–5.1)
Sodium: 130 mmol/L — ABNORMAL LOW (ref 135–145)
Total Bilirubin: 1.8 mg/dL — ABNORMAL HIGH (ref 0.3–1.2)
Total Protein: 7.6 g/dL (ref 6.5–8.1)

## 2016-01-12 LAB — GLUCOSE, CAPILLARY
Glucose-Capillary: 569 mg/dL (ref 65–99)
Glucose-Capillary: 591 mg/dL (ref 65–99)
Glucose-Capillary: 462 mg/dL — ABNORMAL HIGH (ref 65–99)
Glucose-Capillary: 337 mg/dL — ABNORMAL HIGH (ref 65–99)
Glucose-Capillary: 234 mg/dL — ABNORMAL HIGH (ref 65–99)
Glucose-Capillary: 184 mg/dL — ABNORMAL HIGH (ref 65–99)
Glucose-Capillary: 168 mg/dL — ABNORMAL HIGH (ref 65–99)
Glucose-Capillary: 163 mg/dL — ABNORMAL HIGH (ref 65–99)
Glucose-Capillary: 145 mg/dL — ABNORMAL HIGH (ref 65–99)
Glucose-Capillary: 147 mg/dL — ABNORMAL HIGH (ref 65–99)
Glucose-Capillary: 98 mg/dL (ref 65–99)
Glucose-Capillary: 90 mg/dL (ref 65–99)
Glucose-Capillary: 168 mg/dL — ABNORMAL HIGH (ref 65–99)
Glucose-Capillary: 193 mg/dL — ABNORMAL HIGH (ref 65–99)
Glucose-Capillary: 212 mg/dL — ABNORMAL HIGH (ref 65–99)

## 2016-01-12 LAB — URINALYSIS COMPLETE WITH MICROSCOPIC (ARMC ONLY)
Bacteria, UA: NONE SEEN
Bilirubin Urine: NEGATIVE
Glucose, UA: >500 mg/dL — AB
Leukocytes, UA: NEGATIVE
Nitrite: NEGATIVE
Protein, ur: NEGATIVE mg/dL
Specific Gravity, Urine: 1.016 (ref 1.005–1.030)
Squamous Epithelial / LPF: NONE SEEN
pH: 5 (ref 5.0–8.0)

## 2016-01-12 LAB — BASIC METABOLIC PANEL
Anion gap: 18 — ABNORMAL HIGH (ref 5–15)
Anion gap: 9 (ref 5–15)
Anion gap: 10 (ref 5–15)
Anion gap: 8 (ref 5–15)
BUN: 13 mg/dL (ref 6–20)
BUN: 10 mg/dL (ref 6–20)
BUN: 9 mg/dL (ref 6–20)
BUN: 8 mg/dL (ref 6–20)
CO2: 7 mmol/L — ABNORMAL LOW (ref 22–32)
CO2: 14 mmol/L — ABNORMAL LOW (ref 22–32)
CO2: 14 mmol/L — ABNORMAL LOW (ref 22–32)
CO2: 17 mmol/L — ABNORMAL LOW (ref 22–32)
Calcium: 8.1 mg/dL — ABNORMAL LOW (ref 8.9–10.3)
Calcium: 7.9 mg/dL — ABNORMAL LOW (ref 8.9–10.3)
Calcium: 8.2 mg/dL — ABNORMAL LOW (ref 8.9–10.3)
Calcium: 8.1 mg/dL — ABNORMAL LOW (ref 8.9–10.3)
Chloride: 108 mmol/L (ref 101–111)
Chloride: 108 mmol/L (ref 101–111)
Chloride: 107 mmol/L (ref 101–111)
Chloride: 105 mmol/L (ref 101–111)
Creatinine, Ser: 0.86 mg/dL (ref 0.61–1.24)
Creatinine, Ser: 0.79 mg/dL (ref 0.61–1.24)
Creatinine, Ser: 0.57 mg/dL — ABNORMAL LOW (ref 0.61–1.24)
Creatinine, Ser: 0.61 mg/dL (ref 0.61–1.24)
GFR calc Af Amer: >60 mL/min (ref >60)
GFR calc Af Amer: >60 mL/min (ref >60)
GFR calc Af Amer: >60 mL/min (ref >60)
GFR calc Af Amer: >60 mL/min (ref >60)
GFR calc non Af Amer: >60 mL/min (ref >60)
GFR calc non Af Amer: >60 mL/min (ref >60)
GFR calc non Af Amer: >60 mL/min (ref >60)
GFR calc non Af Amer: >60 mL/min (ref >60)
Glucose, Bld: 231 mg/dL — ABNORMAL HIGH (ref 65–99)
Glucose, Bld: 146 mg/dL — ABNORMAL HIGH (ref 65–99)
Glucose, Bld: 98 mg/dL (ref 65–99)
Glucose, Bld: 136 mg/dL — ABNORMAL HIGH (ref 65–99)
Potassium: 3.6 mmol/L (ref 3.5–5.1)
Potassium: 3.2 mmol/L — ABNORMAL LOW (ref 3.5–5.1)
Potassium: 3.1 mmol/L — ABNORMAL LOW (ref 3.5–5.1)
Potassium: 2.7 mmol/L — CL (ref 3.5–5.1)
Sodium: 133 mmol/L — ABNORMAL LOW (ref 135–145)
Sodium: 131 mmol/L — ABNORMAL LOW (ref 135–145)
Sodium: 131 mmol/L — ABNORMAL LOW (ref 135–145)
Sodium: 130 mmol/L — ABNORMAL LOW (ref 135–145)

## 2016-01-12 LAB — BLOOD GAS, VENOUS
Patient temperature: 37
pCO2, Ven: <19 mmHg — CL (ref 44.0–60.0)
pH, Ven: 7 — CL (ref 7.320–7.430)
pO2, Ven: 59 mmHg — ABNORMAL HIGH (ref 31.0–45.0)

## 2016-01-12 LAB — CBC
HCT: 43.7 % (ref 40.0–52.0)
Hemoglobin: 14.1 g/dL (ref 13.0–18.0)
MCH: 30.1 pg (ref 26.0–34.0)
MCHC: 32.4 g/dL (ref 32.0–36.0)
MCV: 93 fL (ref 80.0–100.0)
Platelets: 286 10*3/uL (ref 150–440)
RBC: 4.7 MIL/uL (ref 4.40–5.90)
RDW: 13.2 % (ref 11.5–14.5)
WBC: 19.3 10*3/uL — ABNORMAL HIGH (ref 3.8–10.6)

## 2016-01-12 LAB — BETA-HYDROXYBUTYRIC ACID: Beta-Hydroxybutyric Acid: >8 mmol/L — ABNORMAL HIGH (ref 0.05–0.27)

## 2016-01-12 LAB — TROPONIN I: Troponin I: <0.03 ng/mL (ref <0.031)

## 2016-01-12 LAB — LIPASE, BLOOD: Lipase: 33 U/L (ref 11–51)

## 2016-01-12 LAB — HEMOGLOBIN A1C: Hgb A1c MFr Bld: 13.2 % — ABNORMAL HIGH (ref 4.0–6.0)

## 2016-01-12 MED ORDER — SODIUM CHLORIDE 0.9 % IV SOLN: INTRAVENOUS | Status: DC

## 2016-01-12 MED ORDER — OXYCODONE-ACETAMINOPHEN 7.5-325 MG PO TABS
1.0000 | ORAL_TABLET | Freq: Four times a day (QID) | ORAL | Status: DC | PRN
  Administered 2016-01-12 – 2016-01-14 (×6): 1 via ORAL
  Filled 2016-01-12 (×7): qty 1

## 2016-01-12 MED ORDER — SODIUM CHLORIDE 0.9% FLUSH
3.0000 mL | Freq: Two times a day (BID) | INTRAVENOUS | Status: DC
  Administered 2016-01-12 – 2016-01-15 (×5): 3 mL via INTRAVENOUS

## 2016-01-12 MED ORDER — POLYETHYLENE GLYCOL 3350 17 G PO PACK: 17.0000 g | PACK | Freq: Every day | ORAL | Status: DC | PRN

## 2016-01-12 MED ORDER — DEXTROSE-NACL 5-0.45 % IV SOLN
INTRAVENOUS | Status: DC
  Administered 2016-01-12: 12:00:00 via INTRAVENOUS
  Administered 2016-01-12: 150 mL via INTRAVENOUS

## 2016-01-12 MED ORDER — INSULIN REGULAR HUMAN 100 UNIT/ML IJ SOLN
INTRAMUSCULAR | Status: DC
  Administered 2016-01-12: 1.1 [IU]/h via INTRAVENOUS
  Administered 2016-01-12: 0.3 [IU]/h via INTRAVENOUS
  Administered 2016-01-12: 4.8 [IU]/h via INTRAVENOUS
  Administered 2016-01-12: 4.6 [IU]/h via INTRAVENOUS
  Administered 2016-01-12: 2.7 [IU]/h via INTRAVENOUS
  Administered 2016-01-12: 2.5 [IU]/h via INTRAVENOUS
  Administered 2016-01-13: 0.7 [IU]/h via INTRAVENOUS
  Administered 2016-01-13: 1.1 [IU]/h via INTRAVENOUS
  Administered 2016-01-13: 0.2 [IU]/h via INTRAVENOUS
  Administered 2016-01-13 (×2): 3 [IU]/h via INTRAVENOUS
  Administered 2016-01-13: 1 [IU]/h via INTRAVENOUS
  Administered 2016-01-13: 0.8 [IU]/h via INTRAVENOUS
  Administered 2016-01-13: 1.1 [IU]/h via INTRAVENOUS
  Administered 2016-01-13: 1.5 [IU]/h via INTRAVENOUS
  Filled 2016-01-12: qty 2.5

## 2016-01-12 MED ORDER — ENOXAPARIN SODIUM 40 MG/0.4ML ~~LOC~~ SOLN
40.0000 mg | SUBCUTANEOUS | Status: DC
  Administered 2016-01-12 – 2016-01-13 (×2): 40 mg via SUBCUTANEOUS
  Filled 2016-01-12 (×2): qty 0.4

## 2016-01-12 MED ORDER — ONDANSETRON HCL 4 MG/2ML IJ SOLN
INTRAMUSCULAR | Status: AC
  Administered 2016-01-12: 4 mg via INTRAVENOUS
  Filled 2016-01-12: qty 2

## 2016-01-12 MED ORDER — SODIUM CHLORIDE 0.9 % IV BOLUS (SEPSIS)
1000.0000 mL | Freq: Once | INTRAVENOUS | Status: AC
  Administered 2016-01-12: 1000 mL via INTRAVENOUS

## 2016-01-12 MED ORDER — ACETAMINOPHEN 650 MG RE SUPP: 650.0000 mg | Freq: Four times a day (QID) | RECTAL | Status: DC | PRN

## 2016-01-12 MED ORDER — ALBUTEROL SULFATE (2.5 MG/3ML) 0.083% IN NEBU: 2.5000 mg | INHALATION_SOLUTION | RESPIRATORY_TRACT | Status: DC | PRN

## 2016-01-12 MED ORDER — ONDANSETRON HCL 4 MG PO TABS: 4.0000 mg | ORAL_TABLET | Freq: Four times a day (QID) | ORAL | Status: DC | PRN

## 2016-01-12 MED ORDER — PANTOPRAZOLE SODIUM 40 MG PO TBEC
40.0000 mg | DELAYED_RELEASE_TABLET | Freq: Two times a day (BID) | ORAL | Status: DC
  Administered 2016-01-12 – 2016-01-15 (×6): 40 mg via ORAL
  Filled 2016-01-12 (×6): qty 1

## 2016-01-12 MED ORDER — ONDANSETRON HCL 4 MG/2ML IJ SOLN
4.0000 mg | Freq: Once | INTRAMUSCULAR | Status: AC
  Administered 2016-01-12: 4 mg via INTRAVENOUS

## 2016-01-12 MED ORDER — SODIUM CHLORIDE 0.9 % IV SOLN: INTRAVENOUS | Status: AC

## 2016-01-12 MED ORDER — POTASSIUM CHLORIDE 10 MEQ/100ML IV SOLN
10.0000 meq | INTRAVENOUS | Status: AC
  Administered 2016-01-12 (×2): 10 meq via INTRAVENOUS
  Filled 2016-01-12 (×2): qty 100

## 2016-01-12 MED ORDER — SODIUM CHLORIDE 0.9 % IV SOLN
INTRAVENOUS | Status: DC
  Administered 2016-01-12: 5.3 [IU]/h via INTRAVENOUS
  Filled 2016-01-12: qty 2.5

## 2016-01-12 MED ORDER — ONDANSETRON HCL 4 MG/2ML IJ SOLN: 4.0000 mg | Freq: Four times a day (QID) | INTRAMUSCULAR | Status: DC | PRN

## 2016-01-12 MED ORDER — ACETAMINOPHEN 325 MG PO TABS
650.0000 mg | ORAL_TABLET | Freq: Four times a day (QID) | ORAL | Status: DC | PRN
  Administered 2016-01-12: 650 mg via ORAL
  Filled 2016-01-12: qty 2

## 2016-01-12 MED ORDER — CHLORHEXIDINE GLUCONATE 0.12 % MT SOLN
15.0000 mL | Freq: Two times a day (BID) | OROMUCOSAL | Status: DC
  Administered 2016-01-13 – 2016-01-15 (×5): 15 mL via OROMUCOSAL
  Filled 2016-01-12 (×8): qty 15

## 2016-01-12 MED ORDER — SODIUM CHLORIDE 0.9 % IV BOLUS (SEPSIS): 1000.0000 mL | Freq: Once | INTRAVENOUS | Status: DC

## 2016-01-12 MED ORDER — CLINDAMYCIN HCL 150 MG PO CAPS
300.0000 mg | ORAL_CAPSULE | Freq: Three times a day (TID) | ORAL | Status: AC
  Administered 2016-01-12 – 2016-01-13 (×5): 300 mg via ORAL
  Filled 2016-01-12 (×5): qty 2

## 2016-01-12 MED ORDER — CETYLPYRIDINIUM CHLORIDE 0.05 % MT LIQD
7.0000 mL | Freq: Two times a day (BID) | OROMUCOSAL | Status: DC
  Administered 2016-01-14 – 2016-01-15 (×2): 7 mL via OROMUCOSAL

## 2016-01-12 NOTE — ED Notes (Signed)
Patient found attempting to eat a piece of candy. When this RN walked to other side of stretcher patient attempted to hide candy. Informed patient his sugar is elevated and patient will not be able to eat candy. Patient sounds upset and states "well you won't let me have anything to drink!" Informed patient this RN just administered Zofran for nausea and will be unable to provide patient with drink due to increase vomiting. MD notified of patient attempting to have candy.

## 2016-01-12 NOTE — ED Provider Notes (Signed)
[facility] Emergency Department Provider Note   ____________________________________________  Time seen: Approximately 545 AM  I have reviewed the triage vital signs and the nursing notes.   HISTORY  Chief Complaint Chest Pain; Hyperventilating; Hyperglycemia; and Diabetic Ketoacidosis    HPI Trevor Brown is a 25 y.o. male comes into the hospital today with chest pain. The patient has a history of diabetes and was admitted to the hospital yesterday for diabetic ketoacidosis. The patient signed out from the intensive care unit AGAINST MEDICAL ADVICE because [facility]. The patient reports though that he did not have a ride and [facility] not [facility]. He reports that last night his chest started hurting initially gross got up to 480. He reports that he did take some insulin yesterday but cannot tell me exactly how much. When he was seen here yesterday he had not taken insulin in over 2 weeks. The patient has had a cough but denies any fever. He reports that his chest pain is on the right. The patient is here tonight for further evaluation.The patient rates his pain a 10 out of 10 in intensity.   Past Medical History  Diagnosis Date  . Diabetes mellitus without complication (HCC)   . Hepatitis C, acute may 2016  . Heart attack (HCC)     Pt claims he had heart attack a year ago ( 2015)- and [facility], but not given any meds or angiogram, on review of chart- I could not find any details like that.    Patient Active Problem List   Diagnosis Date Noted  . Nausea & vomiting 01/10/2016  . Tobacco abuse 01/10/2016  . Hyponatremia 12/12/2015  . Type 1 diabetes mellitus with hyperglycemia (HCC) 12/12/2015  . Chest pain 12/12/2015  . Elevated troponin 12/12/2015  . Cocaine abuse 12/12/2015  . DKA (diabetic ketoacidoses) (HCC) 06/10/2015  . Hidradenitis suppurativa of left axilla   . DKA, type 1 (HCC) 06/06/2015  .  Malnutrition of moderate degree (HCC) 04/08/2015  . Hepatitis C 12/13/2014  . Diabetes type 1, uncontrolled (HCC) 12/11/2014  . Major depressive disorder, single episode, mild (HCC)   . Major depression, single episode 12/10/2014  . Diabetes mellitus type 1, uncontrolled (HCC) 12/09/2014  . Transaminitis 12/09/2014  . Folliculitis 12/09/2014  . Homelessness 12/09/2014    History reviewed. No pertinent past surgical history.  Current Outpatient Rx  Name  Route  Sig  Dispense  Refill  . clindamycin (CLEOCIN) 300 MG capsule   Oral   Take 1 capsule by mouth 3 (three) times daily. Reported on 01/10/2016         . insulin aspart protamine- aspart (NOVOLOG MIX 70/30) (70-30) 100 UNIT/ML injection   Subcutaneous   Inject 0.24 mLs (24 Units total) into the skin 2 (two) times daily with a meal.   10 mL   11   . oxyCODONE (OXY IR/ROXICODONE) 5 MG immediate release tablet   Oral   Take 5 mg by mouth every 6 (six) hours as needed.         . pantoprazole (PROTONIX) 40 MG tablet   Oral   Take 1 tablet (40 mg total) by mouth daily.   30 tablet   0     Allergies Bee venom; Penicillins; Ibuprofen; Tramadol; and Vancomycin  Family History  Problem Relation Age of Onset  . Cirrhosis Mother   . Diabetes Mellitus II Maternal Grandmother  Social History Social History  Substance Use Topics  . Smoking status: Current Every Day Smoker -- 1.50 packs/day    Types: Cigarettes  . Smokeless tobacco: None  . Alcohol Use: No    Review of Systems Constitutional: No fever/chills Eyes: No visual changes. ENT: No sore throat. Cardiovascular:  chest pain. Respiratory: shortness of breath. Gastrointestinal: Nausea, vomiting and diarrhea with No abdominal pain.  No diarrhea.  No constipation. Genitourinary: Negative for dysuria. Musculoskeletal: Negative for back pain. Skin: Negative for rash. Neurological: Negative for headaches, focal weakness or numbness.  10-point ROS otherwise  negative.  ____________________________________________   PHYSICAL EXAM:  VITAL SIGNS: ED Triage Vitals  Enc Vitals Group     BP 01/12/16 0529 131/77 mmHg     Pulse Rate 01/12/16 0529 114     Resp 01/12/16 0529 40     Temp --      Temp src --      SpO2 01/12/16 0529 97 %     Weight 01/12/16 0529 120 lb (54.432 kg)     Height 01/12/16 0529 5\' 4"  (1.626 m)     Head Cir --      Peak Flow --      Pain Score 01/12/16 0526 10     Pain Loc --      Pain Edu? --      Excl. in GC? --     Constitutional: Alert and oriented. Anxious appearing and in no moderate to severe distress. Eyes: Conjunctivae are normal. PERRL. EOMI. Head: Atraumatic. Nose: No congestion/rhinnorhea. Mouth/Throat: Mucous membranes are moist.  Oropharynx non-erythematous. Cardiovascular: Tachycardia, regular rhythm. Grossly normal heart sounds.  Good peripheral circulation. Respiratory: Tachypnea  No retractions. Lungs CTAB. Gastrointestinal: Soft and nontender. No distention. Positive bowel sounds Musculoskeletal: No lower extremity tenderness nor edema.   Neurologic:  Normal speech and language.  Skin:  Skin is warm, dry and intact.  Psychiatric: Mood and affect are normal.   ____________________________________________   LABS (all labs ordered are listed, but only abnormal results are displayed)  Labs Reviewed  COMPREHENSIVE METABOLIC PANEL - Abnormal; Notable for the following:    Sodium 130 (*)    CO2 <7 (*)    Glucose, Bld 542 (*)    Calcium 8.5 (*)    Alkaline Phosphatase 204 (*)    Total Bilirubin 1.8 (*)    All other components within normal limits  CBC - Abnormal; Notable for the following:    WBC 19.3 (*)    All other components within normal limits  BETA-HYDROXYBUTYRIC ACID - Abnormal; Notable for the following:    Beta-Hydroxybutyric Acid >8.00 (*)    All other components within normal limits  BLOOD GAS, VENOUS - Abnormal; Notable for the following:    pH, Ven 7.00 (*)    pCO2, Ven  <19 (*)    pO2, Ven 59.0 (*)    All other components within normal limits  GLUCOSE, CAPILLARY - Abnormal; Notable for the following:    Glucose-Capillary 569 (*)    All other components within normal limits  URINALYSIS COMPLETEWITH MICROSCOPIC ([facility] ONLY) - Abnormal; Notable for the following:    Color, Urine COLORLESS (*)    APPearance CLEAR (*)    Glucose, UA >500 (*)    Ketones, ur 2+ (*)    Hgb urine dipstick 1+ (*)    All other components within normal limits  GLUCOSE, CAPILLARY - Abnormal; Notable for the following:    Glucose-Capillary 591 (*)    All other  components within normal limits  GLUCOSE, CAPILLARY - Abnormal; Notable for the following:    Glucose-Capillary 462 (*)    All other components within normal limits  TROPONIN I  LIPASE, BLOOD  CBG MONITORING, ED   ____________________________________________  EKG  ED ECG REPORT I, Rebecka Apley, the attending physician, personally viewed and interpreted this ECG.   Date: 01/12/2016  EKG Time: 541  Rate: 103  Rhythm: sinus tachycardia  Axis: Normal  Intervals:none  ST&T Change: None  ____________________________________________  RADIOLOGY  Chest x-ray: No active cardiopulmonary disease ____________________________________________   PROCEDURES  Procedure(s) performed: None  Critical Care performed: Yes, see critical care note(s)   CRITICAL CARE Performed by: Lucrezia Europe P   Total critical care time: 45 minutes  Critical care time was exclusive of separately billable procedures and treating other patients.  Critical care was necessary to treat or prevent imminent or life-threatening deterioration.  Critical care was time spent personally by me on the following activities: development of treatment plan with patient and/or surrogate as well as nursing, discussions with consultants, evaluation of patient's response to treatment, examination of patient, obtaining history from patient or  surrogate, ordering and performing treatments and interventions, ordering and review of laboratory studies, ordering and review of radiographic studies, pulse oximetry and re-evaluation of patient's condition.   ____________________________________________   INITIAL IMPRESSION / ASSESSMENT AND PLAN / ED COURSE  Pertinent labs & imaging results that were available during my care of the patient were reviewed by me and considered in my medical decision making (see chart for details).  This is a 25 year old male with a history of diabetes who comes into the hospital today with chest pain. The patient signed out AGAINST MEDICAL ADVICE yesterday while being in diabetic ketoacidosis. The patient's blood sugars here are 569. I feel the patient remains in diabetic ketoacidosis. He does have some pH of less than 7 with a bicarbonate undetectable on a VBG and a PCO2 of 19. The patient has some respiratory alkalosis to compensate for his metabolic acidosis. The patient received 2 L of normal saline and we will start the patient on an insulin drip. I am awaiting the results of the patient's blood work and he will be reassessed.  The patient's tachycardia is improved. He will be admitted to the hospitalist service in the ICU. ____________________________________________   FINAL CLINICAL IMPRESSION(S) / ED DIAGNOSES  Final diagnoses:  Diabetic ketoacidosis without coma associated with type 1 diabetes mellitus (HCC)  Dehydration  Chest pain, unspecified chest pain type      NEW MEDICATIONS STARTED DURING THIS VISIT:  New Prescriptions   No medications on file     Note:  This document was prepared using Dragon voice recognition software and may include unintentional dictation errors.    Rebecka Apley, MD 01/12/16 [phone]

## 2016-01-12 NOTE — Progress Notes (Signed)
Inpatient Diabetes Program Recommendations  AACE/ADA: New Consensus Statement on Inpatient Glycemic Control (2015)  Target Ranges:  Prepandial:   less than 140 mg/dL      Peak postprandial:   less than 180 mg/dL (1-2 hours)      Critically ill patients:  140 - 180 mg/dL   Lab Results  Component Value Date   GLUCAP 234* 01/12/2016   HGBA1C 12.7* 12/12/2015    Review of Glycemic Control  Results for Trevor Brown, Trevor Brown (MRN [id number]) as of 01/12/2016 10:16  Ref. Range 01/12/2016 05:51 01/12/2016 06:25 01/12/2016 07:46 01/12/2016 08:55 01/12/2016 10:05  Glucose-Capillary Latest Ref Range: 65-99 mg/dL 409569 (HH) 811591 (HH) 914462 (H) 337 (H) 234 (H)   Diabetes history: Type 1 Outpatient Diabetes medications: Novolog 70/30 24 units bid- not taken in 2 weeks- discharged then readmitted Current orders for Inpatient glycemic control: DKA order set   Inpatient Diabetes Program Recommendations:   IV insulin infusion with sufficient glucose should be continued until acidosis is corrected as determined by MD Venous CO2=20, normal anion gap (8-12), negative ketones  While on IV insulin, if patient is eating cover carbohydrate (CHO) intake using IV insulin/CHO coverage via Glucostabilizer (1 unit for every 10 grams of CHO)  Transition to SQ insulin-MD will initiate Phase 2 of DKA Protocol for transition off insulin drip.  RN discontinue insulin drip 2 hours after subcutaneous basal insulin given and simultaneously give Novolog correction scale.  Susette RacerJulie Cameren Odwyer, RN, BA, MHA, CDE Diabetes Coordinator Inpatient Diabetes Program  [phone] (Team Pager) [phone] [facility] Office) 01/12/2016 10:18 AM

## 2016-01-12 NOTE — ED Notes (Signed)
CBG of 591. RN, Lowry RamBryan G notified. Zenda AlpersWebster, MD notified.

## 2016-01-12 NOTE — H&P (Signed)
[facility] [place] [facility]   PATIENT NAME: Trevor Brown    MR#:  [id number]  DATE OF BIRTH:  1990/11/03  DATE OF ADMISSION:  01/12/2016  PRIMARY CARE PHYSICIAN: No PCP Per Patient   REQUESTING/REFERRING PHYSICIAN: Dr. Zenda AlpersWebster  CHIEF COMPLAINT:   Chief Complaint  Patient presents with  . Chest Pain  . Hyperventilating  . Hyperglycemia  . Diabetic Ketoacidosis    HISTORY OF PRESENT ILLNESS:  Trevor Knackony Cham  is a 25 y.o. male with a known history of Insulin-dependent diabetes mellitus, cocaine abuse, tobacco abuse, noncompliance returns to the emergency room after leaving AGAINST MEDICAL ADVICE yesterday. Patient was treated for DKA on an insulin drip. He chose to leave AMA yesterday at 8 PM. Today morning he felt bad and weak with high blood sugars and returned. Patient did not get any subcutaneous insulin prior to leaving. Patient feels weak. Extremely tachypneic. Severe acidosis with bicarbonate less than 7. Blood sugars in the 500s.  Patient had left A[facility] to [facility] yesterday but couldn't make it because of weakness.  Today he mentions that he does want to get [facility] and [facility]  He does have chronic right-sided pleuritic chest pain which is the same.  PAST MEDICAL HISTORY:   Past Medical History  Diagnosis Date  . Diabetes mellitus without complication (HCC)   . Hepatitis C, acute may 2016  . Heart attack (HCC)     Pt claims he had heart attack a year ago ( 2015)- and was [facility], but not given any meds or angiogram, on review of chart- I could not find any details like that.    PAST SURGICAL HISTORY:  History reviewed. No pertinent past surgical history.  SOCIAL HISTORY:   Social History  Substance Use Topics  . Smoking status: Current Every Day Smoker -- 1.50 packs/day    Types: Cigarettes  . Smokeless tobacco: Not on file  . Alcohol Use: No     FAMILY HISTORY:   Family History  Problem Relation Age of Onset  . Cirrhosis Mother   . Diabetes Mellitus II Maternal Grandmother     DRUG ALLERGIES:   Allergies  Allergen Reactions  . Bee Venom Anaphylaxis  . Penicillins Anaphylaxis, Hives and Other (See Comments)    Has patient had a PCN reaction causing immediate rash, facial/tongue/throat swelling, SOB or lightheadedness with hypotension: Yes Has patient had a PCN reaction causing severe rash involving mucus membranes or skin necrosis: No Has patient had a PCN reaction that required hospitalization No Has patient had a PCN reaction occurring within the last 10 years: Yes If all of the above answers are "NO", then may proceed with Cephalosporin use.  . Ibuprofen Other (See Comments)    Reaction:  GI upset   . Tramadol Hives and Swelling  . Vancomycin Rash and Other (See Comments)    Reaction:  Red man's syndrome     REVIEW OF SYSTEMS:   Review of Systems  Constitutional: Positive for chills, weight loss and malaise/fatigue. Negative for fever.  HENT: Negative for sore throat.   Eyes: Negative for blurred vision, double vision and pain.  Respiratory: Positive for cough. Negative for hemoptysis, shortness of breath and wheezing.   Cardiovascular: Positive for chest pain. Negative for palpitations, orthopnea and leg swelling.  Gastrointestinal: Positive for nausea, vomiting, abdominal pain and diarrhea. Negative for heartburn and constipation.  Genitourinary: Negative for dysuria  and hematuria.  Musculoskeletal: Positive for myalgias. Negative for back pain and joint pain.  Skin: Negative for rash.  Neurological: Positive for dizziness and weakness. Negative for sensory change, speech change, focal weakness and headaches.  Endo/Heme/Allergies: Does not bruise/bleed easily.  Psychiatric/Behavioral: Negative for depression. The patient is not nervous/anxious.     MEDICATIONS AT HOME:   Prior to Admission medications    Medication Sig Start Date End Date Taking? Authorizing Provider  clindamycin (CLEOCIN) 300 MG capsule Take 1 capsule by mouth 3 (three) times daily. Reported on 01/10/2016 01/02/16 01/12/16 Yes Historical Provider, MD  insulin aspart protamine- aspart (NOVOLOG MIX 70/30) (70-30) 100 UNIT/ML injection Inject 0.24 mLs (24 Units total) into the skin 2 (two) times daily with a meal. 12/14/15  Yes Auburn BilberryShreyang Patel, MD  oxyCODONE (OXY IR/ROXICODONE) 5 MG immediate release tablet Take 5 mg by mouth every 6 (six) hours as needed. 01/02/16  Yes Historical Provider, MD  pantoprazole (PROTONIX) 40 MG tablet Take 1 tablet (40 mg total) by mouth daily. 12/14/15  Yes Auburn BilberryShreyang Patel, MD     VITAL SIGNS:  Blood pressure 142/88, pulse 80, resp. rate 26, height 5\' 4"  (1.626 m), weight 54.432 kg (120 lb), SpO2 100 %.  PHYSICAL EXAMINATION:  Physical Exam  GENERAL:  25 y.o.-year-old patient lying in the bed , Looks critically ill.  Neck. EYES: Pupils equal, round, reactive to light and accommodation. No scleral icterus. Extraocular muscles intact.  HEENT: Head atraumatic, normocephalic. Oropharynx and nasopharynx clear. No oropharyngeal erythema, moist oral mucosa  NECK:  Supple, no jugular venous distention. No thyroid enlargement, no tenderness.  LUNGS: Increased work of breathing. Clear to auscultation on both sides. Tenderness right side of the chest CARDIOVASCULAR: S1, S2 normal. No murmurs, rubs, or gallops.  ABDOMEN: Soft, nondistended. Bowel sounds present. No organomegaly or mass. Epigastric tenderness. EXTREMITIES: No pedal edema, cyanosis, or clubbing. + 2 pedal & radial pulses b/l.   NEUROLOGIC: Cranial nerves II through XII are intact. No focal Motor or sensory deficits appreciated b/l PSYCHIATRIC: The patient is alert and oriented x 3. Anxious SKIN: No obvious rash, lesion, or ulcer.   LABORATORY PANEL:   CBC  Recent Labs Lab 01/12/16 0554  WBC 19.3*  HGB 14.1  HCT 43.7  PLT 286    ------------------------------------------------------------------------------------------------------------------  Chemistries   Recent Labs Lab 01/11/16 0210  01/12/16 0554  NA 144  < > 130*  K 3.5  < > 4.6  CL 119*  < > 101  CO2 11*  < > <7*  GLUCOSE 183*  < > 542*  BUN 18  < > 16  CREATININE 1.19  < > 1.20  CALCIUM 8.3*  < > 8.5*  MG 2.1  --   --   AST  --   --  29  ALT  --   --  33  ALKPHOS  --   --  204*  BILITOT  --   --  1.8*  < > = values in this interval not displayed. ------------------------------------------------------------------------------------------------------------------  Cardiac Enzymes  Recent Labs Lab 01/12/16 0554  TROPONINI <0.03   ------------------------------------------------------------------------------------------------------------------  RADIOLOGY:  Dg Chest 1 View  01/10/2016  CLINICAL DATA:  Acute onset of leukocytosis.  Initial encounter. EXAM: CHEST 1 VIEW COMPARISON:  Chest radiograph performed 06/06/2015 FINDINGS: The lungs are well-aerated and clear. There is no evidence of focal opacification, pleural effusion or pneumothorax. The cardiomediastinal silhouette is within normal limits. No acute osseous abnormalities are seen. IMPRESSION: No acute cardiopulmonary process seen. Electronically  Signed   By: Roanna Raider M.D.   On: 01/10/2016 23:50   Dg Chest Portable 1 View  01/12/2016  CLINICAL DATA:  Initial evaluation for acute chest pain for 3 days. EXAM: PORTABLE CHEST 1 VIEW COMPARISON:  Prior radiograph from 01/10/2016. FINDINGS: The cardiac and mediastinal silhouettes are stable in size and contour, and remain within normal limits. The lungs are normally inflated. No airspace consolidation, pleural effusion, or pulmonary edema is identified. There is no pneumothorax. No acute osseous abnormality identified. IMPRESSION: No active cardiopulmonary disease. Electronically Signed   By: Rise Mu M.D.   On: 01/12/2016 05:54      IMPRESSION AND PLAN:   * DKA Due to noncompliance. Bolus 3 L normal saline. Start an insulin drip. BMP every 4 hours. Accu-Cheks every hour. Admitted to stepdown unit. Replace potassium. Change to D5 half normal saline ones blood sugars less than 250. Transition to 26 units twice a day of Novolin N 70/30 once patient is DKA resolves. Check HbA1c.  * Pseudohyponatremia Should improve once blood sugars trend down  * Leukocytosis is likely due to hemoconcentration. Repeat labs in the morning.  * Chronic right-sided chest pain is unchanged.  * Dental caries on clindamycin Patient has one more day of this left. Ordered.  * Cocaine and tobacco abuse Counseled that he quit  All the records are reviewed and case discussed with ED provider. Management plans discussed with the patient, family and they are in agreement.  CODE STATUS: Full code  TOTAL CRITICAL CARE TIME TAKING CARE OF THIS PATIENT: 40 minutes.   Milagros Loll R M.D on 01/12/2016 at 9:02 AM  Between 7am to 6pm - Pager - [phone]  After 6pm go to [email or web address] - password [email or web address] [facility]  [place] [facility]  Office  [phone]  CC: Primary care physician; No PCP Per Patient  Note: This dictation was prepared with Dragon dictation along with smaller phrase technology. Any transcriptional errors that result from this process are unintentional.

## 2016-01-12 NOTE — ED Notes (Signed)
This RN attempted 2nd line x 2 with no success, will get another RN to attempt.

## 2016-01-12 NOTE — ED Notes (Addendum)
Pt to triage via w/c, by EMS; tachypnic, oral mucosa dry, pt restless with generalized weakness; pt was here in ICU for DKA; left AMA "because of the staff, they let my beepers go off"; st FSBS 480 at home; st has no insulin at home; c/o right sided CP and persistent diarrhea; denies N/V; st hx of MI x 2

## 2016-01-12 NOTE — Discharge Summary (Signed)
  Patient left AGAINST MEDICAL ADVICE.  Please refer to detailed history and physical.  * DKA Patient was treated aggressively with IV insulin drip and IV fluid resuscitation. With this his DKA had resolved. Plan was transition him to Novolin N 70/30. Prior to this patient unhappy with his care for unknown reasons [facility] and left AGAINST MEDICAL ADVICE. No prescriptions given.

## 2016-01-13 ENCOUNTER — Inpatient Hospital Stay: Payer: MEDICAID

## 2016-01-13 DIAGNOSIS — M6080 Other myositis, unspecified site: Secondary | ICD-10-CM

## 2016-01-13 DIAGNOSIS — D649 Anemia, unspecified: Secondary | ICD-10-CM

## 2016-01-13 DIAGNOSIS — D72829 Elevated white blood cell count, unspecified: Secondary | ICD-10-CM

## 2016-01-13 LAB — GLUCOSE, CAPILLARY
Glucose-Capillary: 100 mg/dL — ABNORMAL HIGH (ref 65–99)
Glucose-Capillary: 109 mg/dL — ABNORMAL HIGH (ref 65–99)
Glucose-Capillary: 113 mg/dL — ABNORMAL HIGH (ref 65–99)
Glucose-Capillary: 125 mg/dL — ABNORMAL HIGH (ref 65–99)
Glucose-Capillary: 144 mg/dL — ABNORMAL HIGH (ref 65–99)
Glucose-Capillary: 156 mg/dL — ABNORMAL HIGH (ref 65–99)
Glucose-Capillary: 169 mg/dL — ABNORMAL HIGH (ref 65–99)
Glucose-Capillary: 181 mg/dL — ABNORMAL HIGH (ref 65–99)
Glucose-Capillary: 212 mg/dL — ABNORMAL HIGH (ref 65–99)
Glucose-Capillary: 295 mg/dL — ABNORMAL HIGH (ref 65–99)
Glucose-Capillary: 300 mg/dL — ABNORMAL HIGH (ref 65–99)
Glucose-Capillary: 343 mg/dL — ABNORMAL HIGH (ref 65–99)
Glucose-Capillary: 83 mg/dL (ref 65–99)

## 2016-01-13 LAB — CBC
HCT: 36 % — ABNORMAL LOW (ref 40.0–52.0)
HCT: 38.1 % — ABNORMAL LOW (ref 40.0–52.0)
Hemoglobin: 12.5 g/dL — ABNORMAL LOW (ref 13.0–18.0)
Hemoglobin: 13.2 g/dL (ref 13.0–18.0)
MCH: 30.4 pg (ref 26.0–34.0)
MCH: 30 pg (ref 26.0–34.0)
MCHC: 34.7 g/dL (ref 32.0–36.0)
MCHC: 34.7 g/dL (ref 32.0–36.0)
MCV: 87.6 fL (ref 80.0–100.0)
MCV: 86.6 fL (ref 80.0–100.0)
Platelets: 243 10*3/uL (ref 150–440)
Platelets: 240 10*3/uL (ref 150–440)
RBC: 4.11 MIL/uL — ABNORMAL LOW (ref 4.40–5.90)
RBC: 4.39 MIL/uL — ABNORMAL LOW (ref 4.40–5.90)
RDW: 12.3 % (ref 11.5–14.5)
RDW: 12.4 % (ref 11.5–14.5)
WBC: 7.8 10*3/uL (ref 3.8–10.6)
WBC: 7.9 10*3/uL (ref 3.8–10.6)

## 2016-01-13 LAB — BASIC METABOLIC PANEL
Anion gap: 4 — ABNORMAL LOW (ref 5–15)
BUN: 8 mg/dL (ref 6–20)
CO2: 19 mmol/L — ABNORMAL LOW (ref 22–32)
Calcium: 7.8 mg/dL — ABNORMAL LOW (ref 8.9–10.3)
Chloride: 109 mmol/L (ref 101–111)
Creatinine, Ser: 0.61 mg/dL (ref 0.61–1.24)
GFR calc Af Amer: >60 mL/min (ref >60)
GFR calc non Af Amer: >60 mL/min (ref >60)
Glucose, Bld: 138 mg/dL — ABNORMAL HIGH (ref 65–99)
Potassium: 3.3 mmol/L — ABNORMAL LOW (ref 3.5–5.1)
Sodium: 132 mmol/L — ABNORMAL LOW (ref 135–145)

## 2016-01-13 LAB — SODIUM: Sodium: 130 mmol/L — ABNORMAL LOW (ref 135–145)

## 2016-01-13 LAB — MAGNESIUM: Magnesium: 1.9 mg/dL (ref 1.7–2.4)

## 2016-01-13 MED ORDER — NICOTINE 21 MG/24HR TD PT24
21.0000 mg | MEDICATED_PATCH | Freq: Every day | TRANSDERMAL | Status: DC
  Administered 2016-01-13 – 2016-01-15 (×3): 21 mg via TRANSDERMAL
  Filled 2016-01-13 (×3): qty 1

## 2016-01-13 MED ORDER — INSULIN ASPART PROT & ASPART (70-30 MIX) 100 UNIT/ML ~~LOC~~ SUSP: 24.0000 [IU] | Freq: Two times a day (BID) | SUBCUTANEOUS | Status: DC

## 2016-01-13 MED ORDER — POTASSIUM CHLORIDE 10 MEQ/100ML IV SOLN
10.0000 meq | INTRAVENOUS | Status: AC
  Administered 2016-01-13 (×4): 10 meq via INTRAVENOUS
  Filled 2016-01-13 (×4): qty 100

## 2016-01-13 MED ORDER — POTASSIUM CHLORIDE IN NACL 20-0.9 MEQ/L-% IV SOLN
INTRAVENOUS | Status: DC
  Administered 2016-01-13 – 2016-01-14 (×2): via INTRAVENOUS
  Filled 2016-01-13 (×7): qty 1000

## 2016-01-13 MED ORDER — INSULIN ASPART 100 UNIT/ML ~~LOC~~ SOLN
0.0000 [IU] | Freq: Every day | SUBCUTANEOUS | Status: DC
  Administered 2016-01-13: 4 [IU] via SUBCUTANEOUS
  Administered 2016-01-14: 3 [IU] via SUBCUTANEOUS
  Filled 2016-01-13 (×2): qty 4
  Filled 2016-01-13: qty 3

## 2016-01-13 MED ORDER — INSULIN ASPART 100 UNIT/ML ~~LOC~~ SOLN
0.0000 [IU] | Freq: Three times a day (TID) | SUBCUTANEOUS | Status: DC
  Administered 2016-01-13 (×2): 11 [IU] via SUBCUTANEOUS
  Administered 2016-01-14: 4 [IU] via SUBCUTANEOUS
  Administered 2016-01-14: 20 [IU] via SUBCUTANEOUS
  Administered 2016-01-14: 11 [IU] via SUBCUTANEOUS
  Administered 2016-01-15: 15 [IU] via SUBCUTANEOUS
  Administered 2016-01-15: 3 [IU] via SUBCUTANEOUS
  Filled 2016-01-13: qty 20
  Filled 2016-01-13 (×2): qty 11
  Filled 2016-01-13: qty 3
  Filled 2016-01-13: qty 11
  Filled 2016-01-13: qty 15

## 2016-01-13 MED ORDER — HYDROCOD POLST-CPM POLST ER 10-8 MG/5ML PO SUER
5.0000 mL | Freq: Two times a day (BID) | ORAL | Status: DC
  Administered 2016-01-13 – 2016-01-14 (×3): 5 mL via ORAL
  Filled 2016-01-13 (×3): qty 5

## 2016-01-13 MED ORDER — INSULIN GLARGINE 100 UNIT/ML ~~LOC~~ SOLN
14.0000 [IU] | SUBCUTANEOUS | Status: DC
  Administered 2016-01-13 – 2016-01-14 (×2): 14 [IU] via SUBCUTANEOUS
  Filled 2016-01-13 (×2): qty 0.14

## 2016-01-13 MED ORDER — SODIUM CHLORIDE 0.9 % IV SOLN
INTRAVENOUS | Status: DC
  Administered 2016-01-13: 125 mL via INTRAVENOUS

## 2016-01-13 MED ORDER — CODEINE SULFATE 30 MG PO TABS
30.0000 mg | ORAL_TABLET | ORAL | Status: DC | PRN
  Administered 2016-01-13: 30 mg via ORAL
  Filled 2016-01-13: qty 1

## 2016-01-13 NOTE — Progress Notes (Signed)
Patient was brought to room from ICU to room 146, Isolation for MRSA. IV fluids restarted. A&O x4. Up with SBA. 2 IVs in place. Right forearm. is swollen, MD aware, MRI already taken.

## 2016-01-13 NOTE — Progress Notes (Deleted)
Pt alert and following commands. Pt tolerated being on Pressure Support mode on ventilator. Dr. Dema SeverinMungal ordered for pt to be extubated. Pt extubated. Pt on 2 liters oxygen via nasal cannula. Pt able to cough. Aarica Wax E 11:38 AM 01/13/2016

## 2016-01-13 NOTE — Care Management Note (Addendum)
Case Management Note  Patient Details  Name: Trevor Brown MRN: [id number] Date of Birth: May 03, 1991  Subjective/Objective:      Provided Mr Trevor Brown with a list of local clinic which serve the uninsured and encouraged him to call and make an appointment so that he would have outpatient care for his diabetes. Provided Mr Trevor Brown with a MATCH program coupon and explained to him that [facility] can [facility], his prescriptions, and a photo ID to any [facility] the [facility] to obtain his prescriptions for $3.00 each. Was informed by ICU nurse that Mr Trevor Brown is discharging home today.               Action/Plan:   Expected Discharge Date:  01/14/16               Expected Discharge Plan:     In-House Referral:     Discharge planning Services     Post Acute Care Choice:    Choice offered to:     DME Arranged:    DME Agency:     HH Arranged:    HH Agency:     Status of Service:     If discussed at [employer] Meetings, dates discussed:    Additional Comments:  Reagen Haberman A, RN 01/13/2016, 2:41 PM

## 2016-01-13 NOTE — Progress Notes (Signed)
Paged Dr. Winona LegatoVaickute, patient states he smokes up to 2 packs of cigarettes daily and had requested a nicotine patch earlier but was never given one.  No orders present at this time.  Pending returned call and orders.

## 2016-01-13 NOTE — Discharge Summary (Signed)
[facility] - [place] [facility]   PATIENT NAME: Trevor Brown    MR#:  [id number]  DATE OF BIRTH:  1991-06-04  DATE OF ADMISSION:  01/12/2016 ADMITTING PHYSICIAN: Milagros Loll, MD  DATE OF DISCHARGE: No discharge date for patient encounter.  PRIMARY CARE PHYSICIAN: No PCP Per Patient     ADMISSION DIAGNOSIS:  Dehydration [E86.0] Chest pain, unspecified chest pain type [R07.9] Diabetic ketoacidosis without coma associated with type 1 diabetes mellitus (HCC) [E10.10]  DISCHARGE DIAGNOSIS:  Principal Problem:   DKA (diabetic ketoacidoses) (HCC) Active Problems:   Hyponatremia   Cocaine abuse   Focal myositis   Anemia   Leukocytosis   Tobacco abuse   SECONDARY DIAGNOSIS:   Past Medical History  Diagnosis Date  . Diabetes mellitus without complication (HCC)   . Hepatitis C, acute may 2016  . Heart attack (HCC)     Pt claims he had heart attack a year ago ( 2015)- and was admitted in [facility] for that, but not given any meds or angiogram, on review of chart- I could not find any details like that.    .pro HOSPITAL COURSE:   Patient is 25 year old Caucasian male with medical history significant for history of diabetes mellitus type 1, cocaine, tobacco abuse, noncompliance, who presents to the hospital with DKA. Apparently patient was admitted for DKA for one day ago, and left AGAINST MEDICAL ADVICE, only to return back with weakness and hyperglycemia. On arrival to emergency room, patient was noted to be in severe acidosis with bicarbonate level less than of 7, blood glucose levels were in 500s. He was tachypneic. He was admitted to the hospital for further evaluation and treatment, initiated on insulin IV drip, IV fluids, and improved. Initial labs revealed acidosis, hyperglycemia, leukocytosis, hyponatremia. Chest x-ray was unremarkable. Urinalysis was unremarkable. Urine drug screen was positive for cocaine. Patient admitted of injecting cocaine  recently. He complained of right forearm pain and swelling and admitted of injecting cocaine in  the right forearm. At that point an  MRI of his forearm was performed revealing focal cellulitis and myositis involving right upper radial aspect of forearm. Small hematoma was suspected but no definite abscess was noted.  Discussion by problem: #1 DKA due to noncompliance, resolved on conservative therapy, patient is to resume his outpatient medications #2. Cocaine abuse with recent injection in right forearm with resultant inflammation, questionable infection. Patient's white blood cell count will be repeated today and patient will be initiated on Zyvox if white blood cell count is consistently elevated, patient is recommended to follow-up with primary care physician for further recommendations and recommended to stop using cocaine #3 right forearm swelling, no abscess, likely inflammation due to injection, although cannot rule out infection, initiate antibiotic therapy with Zyvox if CBC is normal. Patient is afebrile, getting blood cultures 23 #4. Back abuse, discussed with patient in the past, nicotine replacement therapy was recommended upon discharge #5. Hyponatremia, follow as outpatient #6. Anemia, get Hemoccult #7. Leukocytosis, resolved, repeating CBC   DISCHARGE CONDITIONS:   Stable  CONSULTS OBTAINED:     DRUG ALLERGIES:   Allergies  Allergen Reactions  . Bee Venom Anaphylaxis  . Penicillins Anaphylaxis, Hives and Other (See Comments)    Has patient had a PCN reaction causing immediate rash, facial/tongue/throat swelling, SOB or lightheadedness with hypotension: Yes Has patient had a PCN reaction causing severe rash involving mucus membranes or skin necrosis: No Has patient had a PCN reaction that required hospitalization No Has patient  had a PCN reaction occurring within the last 10 years: Yes If all of the above answers are "NO", then may proceed with Cephalosporin use.  .  Ibuprofen Other (See Comments)    Reaction:  GI upset   . Tramadol Hives and Swelling  . Vancomycin Rash and Other (See Comments)    Reaction:  Red man's syndrome     DISCHARGE MEDICATIONS:   Current Discharge Medication List    CONTINUE these medications which have CHANGED   Details  insulin aspart protamine- aspart (NOVOLOG MIX 70/30) (70-30) 100 UNIT/ML injection Inject 0.24 mLs (24 Units total) into the skin 2 (two) times daily with a meal. Qty: 10 mL, Refills: 11      CONTINUE these medications which have NOT CHANGED   Details  oxyCODONE (OXY IR/ROXICODONE) 5 MG immediate release tablet Take 5 mg by mouth every 6 (six) hours as needed.    pantoprazole (PROTONIX) 40 MG tablet Take 1 tablet (40 mg total) by mouth daily. Qty: 30 tablet, Refills: 0      STOP taking these medications     clindamycin (CLEOCIN) 300 MG capsule          DISCHARGE INSTRUCTIONS:    Patient is to follow-up with primary care physician as outpatient  If you experience worsening of your admission symptoms, develop shortness of breath, life threatening emergency, suicidal or homicidal thoughts you must seek medical attention immediately by calling 911 or calling your MD immediately  if symptoms less severe.  You Must read complete instructions/literature along with all the possible adverse reactions/side effects for all the Medicines you take and that have been prescribed to you. Take any new Medicines after you have completely understood and accept all the possible adverse reactions/side effects.   Please note  You were cared for by a hospitalist during your hospital stay. If you have any questions about your discharge medications or the care you received while you were in the hospital after you are discharged, you can call the unit and asked to speak with the hospitalist on call if the hospitalist that took care of you is not available. Once you are discharged, your primary care physician will  handle any further medical issues. Please note that NO REFILLS for any discharge medications will be authorized once you are discharged, as it is imperative that you return to your primary care physician (or establish a relationship with a primary care physician if you do not have one) for your aftercare needs so that they can reassess your need for medications and monitor your lab values.    Today   CHIEF COMPLAINT:   Chief Complaint  Patient presents with  . Chest Pain  . Hyperventilating  . Hyperglycemia  . Diabetic Ketoacidosis    HISTORY OF PRESENT ILLNESS:  Trevor Brown  is a 25 y.o. male with a known history of diabetes mellitus type 1, cocaine, tobacco abuse, noncompliance, who presents to the hospital with DKA. Apparently patient was admitted for DKA for one day ago, and left AGAINST MEDICAL ADVICE, only to return back with weakness and hyperglycemia. On arrival to emergency room, patient was noted to be in severe acidosis with bicarbonate level less than of 7, blood glucose levels were in 500s. He was tachypneic. He was admitted to the hospital for further evaluation and treatment, initiated on insulin IV drip, IV fluids, and improved. Initial labs revealed acidosis, hyperglycemia, leukocytosis, hyponatremia. Chest x-ray was unremarkable. Urinalysis was unremarkable. Urine drug screen was positive for  cocaine. Patient admitted of injecting cocaine recently. He complained of right forearm pain and swelling and admitted of injecting cocaine in  the right forearm. At that point an  MRI of his forearm was performed revealing focal cellulitis and myositis involving right upper radial aspect of forearm. Small hematoma was suspected but no definite abscess was noted.  Discussion by problem: #1 DKA due to noncompliance, resolved on conservative therapy, patient is to resume his outpatient medications #2. Cocaine abuse with recent injection in right forearm with resultant inflammation,  questionable infection. Patient's white blood cell count will be repeated today and patient will be initiated on Zyvox if white blood cell count is consistently elevated, patient is recommended to follow-up with primary care physician for further recommendations and recommended to stop using cocaine #3 right forearm swelling, no abscess, likely inflammation due to injection, although cannot rule out infection, initiate antibiotic therapy with Zyvox if CBC is normal. Patient is afebrile, getting blood cultures 23 #4. Tobacco abuse, discussed with patient in the past, nicotine replacement therapy was recommended upon discharge #5. Hyponatremia, follow as outpatient #6. Anemia, get Hemoccult #7. Leukocytosis, resolved, repeating CBC    VITAL SIGNS:  Blood pressure 125/74, pulse 57, temperature 98.2 F (36.8 C), temperature source Oral, resp. rate 23, height 5\' 4"  (1.626 m), weight 55.5 kg (122 lb 5.7 oz), SpO2 99 %.  I/O:   Intake/Output Summary (Last 24 hours) at 01/13/16 1337 Last data filed at 01/13/16 0711  Gross per 24 hour  Intake 3101.17 ml  Output    800 ml  Net 2301.17 ml    PHYSICAL EXAMINATION:  GENERAL:  25 y.o.-year-old patient lying in the bed with no acute distress.  EYES: Pupils equal, round, reactive to light and accommodation. No scleral icterus. Extraocular muscles intact.  HEENT: Head atraumatic, normocephalic. Oropharynx and nasopharynx clear.  NECK:  Supple, no jugular venous distention. No thyroid enlargement, no tenderness.  LUNGS: Normal breath sounds bilaterally, no wheezing, rales,rhonchi or crepitation. No use of accessory muscles of respiration.  CARDIOVASCULAR: S1, S2 normal. No murmurs, rubs, or gallops.  ABDOMEN: Soft, non-tender, non-distended. Bowel sounds present. No organomegaly or mass.  EXTREMITIES: No pedal edema, cyanosis, or clubbing. Right upper forearm localized swelling and painful palpation but no fluctuations  NEUROLOGIC: Cranial nerves II  through XII are intact. Muscle strength 5/5 in all extremities. Sensation intact. Gait not checked.  PSYCHIATRIC: The patient is alert and oriented x 3.  SKIN: No obvious rash, lesion, or ulcer.   DATA REVIEW:   CBC  Recent Labs Lab 01/13/16 0303  WBC 7.8  HGB 12.5*  HCT 36.0*  PLT 243    Chemistries   Recent Labs Lab 01/12/16 0554  01/13/16 0303  NA 130*  < > 132*  K 4.6  < > 3.3*  CL 101  < > 109  CO2 <7*  < > 19*  GLUCOSE 542*  < > 138*  BUN 16  < > 8  CREATININE 1.20  < > 0.61  CALCIUM 8.5*  < > 7.8*  MG  --   --  1.9  AST 29  --   --   ALT 33  --   --   ALKPHOS 204*  --   --   BILITOT 1.8*  --   --   < > = values in this interval not displayed.  Cardiac Enzymes  Recent Labs Lab 01/12/16 0554  TROPONINI <0.03    Microbiology Results  Results for orders placed or  performed during the hospital encounter of 01/10/16  MRSA PCR Screening     Status: Abnormal   Collection Time: 01/11/16 12:42 AM  Result Value Ref Range Status   MRSA by PCR POSITIVE (A) NEGATIVE Final    Comment: CRITICAL RESULT CALLED TO, READ BACK BY AND VERIFIED WITH: ZACHARY ALLEN ON 01/11/16 AT 0340 BY TLB        The GeneXpert MRSA Assay (FDA approved for NASAL specimens only), is one component of a comprehensive MRSA colonization surveillance program. It is not intended to diagnose MRSA infection nor to guide or monitor treatment for MRSA infections.     RADIOLOGY:  Mr Forearm Right W/o Cm  01/13/2016  CLINICAL DATA:  Pain, swelling and redness for several days. EXAM: MRI OF THE RIGHT FOREARM WITHOUT CONTRAST TECHNIQUE: Multiplanar, multisequence MR imaging was performed. No intravenous contrast was administered. COMPARISON:  None. FINDINGS: Nonspecific subcutaneous soft tissue swelling/ edema/fluid mainly along the radial aspect of the upper forearm. There is also mild associated underline myositis involving the palmar aspect of the extensor musculature. No focal drainable  abscess for pyomyositis. Small more focal area of signal abnormality with slight increased T1 signal intensity could be a small hematoma. No findings for septic arthritis or osteomyelitis. IMPRESSION: Findings most consistent with focal cellulitis and myositis involving the upper radial aspect of the forearm. Small hematoma is also possible. No definite abscess and no findings for septic arthritis or osteomyelitis. Electronically Signed   By: Rudie Meyer M.D.   On: 01/13/2016 13:09   Dg Chest Portable 1 View  01/12/2016  CLINICAL DATA:  Initial evaluation for acute chest pain for 3 days. EXAM: PORTABLE CHEST 1 VIEW COMPARISON:  Prior radiograph from 01/10/2016. FINDINGS: The cardiac and mediastinal silhouettes are stable in size and contour, and remain within normal limits. The lungs are normally inflated. No airspace consolidation, pleural effusion, or pulmonary edema is identified. There is no pneumothorax. No acute osseous abnormality identified. IMPRESSION: No active cardiopulmonary disease. Electronically Signed   By: Rise Mu M.D.   On: 01/12/2016 05:54    EKG:   Orders placed or performed during the hospital encounter of 01/12/16  . ED EKG  . ED EKG      Management plans discussed with the patient, family and they are in agreement.  CODE STATUS:     Code Status Orders        Start     Ordered   01/12/16 0858  Full code   Continuous     01/12/16 0858    Code Status History    Date Active Date Inactive Code Status Order ID Comments User Context   01/11/2016 12:35 AM 01/11/2016 11:20 PM Full Code [id number]  Gery Pray, MD Inpatient   12/12/2015 10:04 PM 12/14/2015  6:38 PM Full Code [id number]  Katharina Caper, MD Inpatient   10/23/2015  5:42 PM 10/25/2015  7:04 PM Full Code [id number]  Katha Hamming, MD ED   06/10/2015  8:20 AM 06/12/2015  2:41 PM Full Code [id number]  Arnaldo Natal, MD Inpatient   06/06/2015  8:43 PM 06/07/2015  4:20 PM Full Code [id number]  Enedina Finner, MD Inpatient   04/07/2015 12:45 PM 04/08/2015  8:37 PM Full Code [id number]  Altamese Dilling, MD Inpatient   12/09/2014  2:44 PM 12/13/2014  4:16 PM Full Code [id number]  Gale Journey, MD Inpatient      TOTAL TIME TAKING CARE OF THIS PATIENT: 40  minutes.    [email or web address]  on 01/13/2016 at 1:37 PM  Between 7am to 6pm - Pager - [phone]  After 6pm go to [email or web address] - password [email or web address]  [facility]  Office  [phone]  CC: Primary care physician; No PCP Per Patient

## 2016-01-14 LAB — BASIC METABOLIC PANEL
Anion gap: 8 (ref 5–15)
BUN: 10 mg/dL (ref 6–20)
CO2: 26 mmol/L (ref 22–32)
Calcium: 8 mg/dL — ABNORMAL LOW (ref 8.9–10.3)
Chloride: 97 mmol/L — ABNORMAL LOW (ref 101–111)
Creatinine, Ser: 0.61 mg/dL (ref 0.61–1.24)
GFR calc Af Amer: >60 mL/min (ref >60)
GFR calc non Af Amer: >60 mL/min (ref >60)
Glucose, Bld: 382 mg/dL — ABNORMAL HIGH (ref 65–99)
Potassium: 2.7 mmol/L — CL (ref 3.5–5.1)
Sodium: 131 mmol/L — ABNORMAL LOW (ref 135–145)

## 2016-01-14 LAB — GLUCOSE, CAPILLARY
Glucose-Capillary: 401 mg/dL — ABNORMAL HIGH (ref 65–99)
Glucose-Capillary: 199 mg/dL — ABNORMAL HIGH (ref 65–99)
Glucose-Capillary: 291 mg/dL — ABNORMAL HIGH (ref 65–99)
Glucose-Capillary: 274 mg/dL — ABNORMAL HIGH (ref 65–99)
Glucose-Capillary: 273 mg/dL — ABNORMAL HIGH (ref 65–99)

## 2016-01-14 LAB — MAGNESIUM: Magnesium: 1.9 mg/dL (ref 1.7–2.4)

## 2016-01-14 LAB — POTASSIUM: Potassium: 3.2 mmol/L — ABNORMAL LOW (ref 3.5–5.1)

## 2016-01-14 LAB — OSMOLALITY, URINE: Osmolality, Ur: 691 mOsm/kg (ref 300–900)

## 2016-01-14 MED ORDER — POTASSIUM CHLORIDE 10 MEQ/100ML IV SOLN
10.0000 meq | INTRAVENOUS | Status: AC
  Administered 2016-01-14 (×4): 10 meq via INTRAVENOUS
  Filled 2016-01-14 (×4): qty 100

## 2016-01-14 MED ORDER — DOXYCYCLINE HYCLATE 100 MG PO TABS
100.0000 mg | ORAL_TABLET | Freq: Two times a day (BID) | ORAL | Status: DC
  Administered 2016-01-14: 100 mg via ORAL
  Filled 2016-01-14: qty 1

## 2016-01-14 MED ORDER — OXYCODONE HCL 5 MG PO TABS
5.0000 mg | ORAL_TABLET | ORAL | Status: DC | PRN
  Administered 2016-01-14 – 2016-01-15 (×7): 5 mg via ORAL
  Filled 2016-01-14 (×7): qty 1

## 2016-01-14 MED ORDER — SULFAMETHOXAZOLE-TRIMETHOPRIM 800-160 MG PO TABS
1.0000 | ORAL_TABLET | Freq: Two times a day (BID) | ORAL | Status: DC
  Administered 2016-01-14 – 2016-01-15 (×3): 1 via ORAL
  Filled 2016-01-14 (×3): qty 1

## 2016-01-14 MED ORDER — INSULIN ASPART PROT & ASPART (70-30 MIX) 100 UNIT/ML ~~LOC~~ SUSP
24.0000 [IU] | Freq: Two times a day (BID) | SUBCUTANEOUS | Status: DC
  Administered 2016-01-14 – 2016-01-15 (×2): 24 [IU] via SUBCUTANEOUS
  Filled 2016-01-14 (×2): qty 24

## 2016-01-14 NOTE — Progress Notes (Signed)
Pt stated that he feels like he is sliding off the bed when he closes his eyes. Asked if it could be a side effect of medications. New medications today were doxycycline x 1 dose, bactrim x 1 dose, and Insulin 70/30 mix. Reported to RN for oncoming shift, will continue to monitor.

## 2016-01-14 NOTE — H&P (Deleted)
[facility] -  at [facility]   PATIENT NAME: Trevor Brown    MR#:  [id number]  DATE OF BIRTH:  May 17, 1991  DATE OF ADMISSION:  01/12/2016  PRIMARY CARE PHYSICIAN: No PCP Per Patient   REQUESTING/REFERRING PHYSICIAN: Dr. Zenda Alpers  CHIEF COMPLAINT:   Chief Complaint  Patient presents with  . Chest Pain  . Hyperventilating  . Hyperglycemia  . Diabetic Ketoacidosis    HISTORY OF PRESENT ILLNESS:  Patient is 25 year old Caucasian male with medical history significant for history of diabetes mellitus type 1, cocaine, tobacco abuse, noncompliance, who presents to the hospital with DKA. Apparently patient was admitted for DKA for one day ago, and left AGAINST MEDICAL ADVICE, only to return back with weakness and hyperglycemia. On arrival to emergency room, patient was noted to be in severe acidosis with bicarbonate level less than of 7, blood glucose levels were in 500s. He was tachypneic. He was admitted to the hospital for further evaluation and treatment, initiated on insulin IV drip, IV fluids, and improved. Initial labs revealed acidosis, hyperglycemia, leukocytosis, hyponatremia. Chest x-ray was unremarkable. Urinalysis was unremarkable. Urine drug screen was positive for cocaine. Patient admitted of injecting cocaine recently. He complained of right forearm pain and swelling and admitted of injecting cocaine in the right forearm. At that point an MRI of his forearm was performed revealing focal cellulitis and myositis involving right upper radial aspect of forearm. Small hematoma was suspected but no definite abscess was noted.  Today, patient complains of right upper extremity swelling and pain, today, with erythematous blotch on the top of the area. Patient requests more pain medications. Blood glucose levels are high to 400s, requiring additional insulin. Potassium was found to be level low at 2.7, being supplemented intravenously.  PAST MEDICAL HISTORY:    Past Medical History  Diagnosis Date  . Diabetes mellitus without complication (HCC)   . Hepatitis C, acute may 2016  . Heart attack (HCC)     Pt claims he had heart attack a year ago ( 2015)- and was admitted in [facility] for that, but not given any meds or angiogram, on review of chart- I could not find any details like that.    PAST SURGICAL HISTORY:  History reviewed. No pertinent past surgical history.  SOCIAL HISTORY:   Social History  Substance Use Topics  . Smoking status: Current Every Day Smoker -- 1.50 packs/day    Types: Cigarettes  . Smokeless tobacco: Not on file  . Alcohol Use: No    FAMILY HISTORY:   Family History  Problem Relation Age of Onset  . Cirrhosis Mother   . Diabetes Mellitus II Maternal Grandmother     DRUG ALLERGIES:   Allergies  Allergen Reactions  . Bee Venom Anaphylaxis  . Penicillins Anaphylaxis, Hives and Other (See Comments)    Has patient had a PCN reaction causing immediate rash, facial/tongue/throat swelling, SOB or lightheadedness with hypotension: Yes Has patient had a PCN reaction causing severe rash involving mucus membranes or skin necrosis: No Has patient had a PCN reaction that required hospitalization No Has patient had a PCN reaction occurring within the last 10 years: Yes If all of the above answers are "NO", then may proceed with Cephalosporin use.  . Ibuprofen Other (See Comments)    Reaction:  GI upset   . Tramadol Hives and Swelling  . Vancomycin Rash and Other (See Comments)    Reaction:  Red man's syndrome     REVIEW OF SYSTEMS:  Review of Systems  Constitutional: Positive for chills, weight loss and malaise/fatigue. Negative for fever.  HENT: Negative for sore throat.   Eyes: Negative for blurred vision, double vision and pain.  Respiratory: Positive for cough. Negative for hemoptysis, shortness of breath and wheezing.   Cardiovascular: Positive for chest pain. Negative for palpitations, orthopnea and leg  swelling.  Gastrointestinal: Positive for nausea, vomiting, abdominal pain and diarrhea. Negative for heartburn and constipation.  Genitourinary: Negative for dysuria and hematuria.  Musculoskeletal: Positive for myalgias. Negative for back pain and joint pain.  Skin: Negative for rash.  Neurological: Positive for dizziness and weakness. Negative for sensory change, speech change, focal weakness and headaches.  Endo/Heme/Allergies: Does not bruise/bleed easily.  Psychiatric/Behavioral: Negative for depression. The patient is not nervous/anxious.     MEDICATIONS AT HOME:   Prior to Admission medications   Medication Sig Start Date End Date Taking? Authorizing Provider  clindamycin (CLEOCIN) 300 MG capsule Take 1 capsule by mouth 3 (three) times daily. Reported on 01/10/2016 01/02/16 01/12/16 Yes Historical Provider, MD  insulin aspart protamine- aspart (NOVOLOG MIX 70/30) (70-30) 100 UNIT/ML injection Inject 0.24 mLs (24 Units total) into the skin 2 (two) times daily with a meal. 12/14/15  Yes Auburn BilberryShreyang Patel, MD  oxyCODONE (OXY IR/ROXICODONE) 5 MG immediate release tablet Take 5 mg by mouth every 6 (six) hours as needed. 01/02/16  Yes Historical Provider, MD  pantoprazole (PROTONIX) 40 MG tablet Take 1 tablet (40 mg total) by mouth daily. 12/14/15  Yes Auburn BilberryShreyang Patel, MD     VITAL SIGNS:  Blood pressure 118/88, pulse 66, temperature 98 F (36.7 C), temperature source Oral, resp. rate 18, height 5\' 4"  (1.626 m), weight 52.527 kg (115 lb 12.8 oz), SpO2 100 %.  PHYSICAL EXAMINATION:  Physical Exam  GENERAL:  25 y.o.-year-old patient lying in the bed , Looks critically ill.  Neck. EYES: Pupils equal, round, reactive to light and accommodation. No scleral icterus. Extraocular muscles intact.  HEENT: Head atraumatic, normocephalic. Oropharynx and nasopharynx clear. No oropharyngeal erythema, moist oral mucosa  NECK:  Supple, no jugular venous distention. No thyroid enlargement, no tenderness.   LUNGS: Increased work of breathing. Clear to auscultation on both sides. Tenderness right side of the chest CARDIOVASCULAR: S1, S2 normal. No murmurs, rubs, or gallops.  ABDOMEN: Soft, nondistended. Bowel sounds present. No organomegaly or mass. Epigastric tenderness. EXTREMITIES: No pedal edema, cyanosis, or clubbing. + 2 pedal & radial pulses b/l.  Right upper extremity proximal forearm area medially has swelling, erythematous area, of approximately 2 inches in diameter, with significant pain on palpation, no fluctuations were felt, mildly increased warmth of the skin NEUROLOGIC: Cranial nerves II through XII are intact. No focal Motor or sensory deficits appreciated b/l PSYCHIATRIC: The patient is alert and oriented x 3. Anxious SKIN: No obvious rash, lesion, or ulcer.   LABORATORY PANEL:   CBC  Recent Labs Lab 01/13/16 1402  WBC 7.9  HGB 13.2  HCT 38.1*  PLT 240   ------------------------------------------------------------------------------------------------------------------  Chemistries   Recent Labs Lab 01/12/16 0554  01/14/16 0411  NA 130*  < > 131*  K 4.6  < > 2.7*  CL 101  < > 97*  CO2 <7*  < > 26  GLUCOSE 542*  < > 382*  BUN 16  < > 10  CREATININE 1.20  < > 0.61  CALCIUM 8.5*  < > 8.0*  MG  --   < > 1.9  AST 29  --   --  ALT 33  --   --   ALKPHOS 204*  --   --   BILITOT 1.8*  --   --   < > = values in this interval not displayed. ------------------------------------------------------------------------------------------------------------------  Cardiac Enzymes  Recent Labs Lab 01/12/16 0554  TROPONINI <0.03   ------------------------------------------------------------------------------------------------------------------  RADIOLOGY:  Mr Forearm Right W/o Cm  01/13/2016  CLINICAL DATA:  Pain, swelling and redness for several days. EXAM: MRI OF THE RIGHT FOREARM WITHOUT CONTRAST TECHNIQUE: Multiplanar, multisequence MR imaging was performed. No  intravenous contrast was administered. COMPARISON:  None. FINDINGS: Nonspecific subcutaneous soft tissue swelling/ edema/fluid mainly along the radial aspect of the upper forearm. There is also mild associated underline myositis involving the palmar aspect of the extensor musculature. No focal drainable abscess for pyomyositis. Small more focal area of signal abnormality with slight increased T1 signal intensity could be a small hematoma. No findings for septic arthritis or osteomyelitis. IMPRESSION: Findings most consistent with focal cellulitis and myositis involving the upper radial aspect of the forearm. Small hematoma is also possible. No definite abscess and no findings for septic arthritis or osteomyelitis. Electronically Signed   By: Rudie MeyerP.  Gallerani M.D.   On: 01/13/2016 13:09     IMPRESSION AND PLAN:   #1 DKA due to noncompliance, resolved on conservative therapy, patient is to resume his outpatient medications much changing insulin Lantus to NovoLog mix, following blood glucose levels closely, continue sliding scale insulin for now #2. Cocaine abuse with recent injection in right forearm with resultant inflammation, questionable infection. Patient's white blood cell count was normal, however. Inflammation is significant, now with erythematous area, initiate patient on Septra DS, patient recommended to follow-up with primary care physician for further recommendations and recommended to stop using cocaine #3 right forearm swelling, likely myositis, inflammatory or infectious, initiate antibiotic Septra DS. Patient's blood cultures 2 are negative so far, however, patient's temperature increased to 99.7 today #4. Tobacco abuse, discussed with patient in the past, nicotine replacement therapy was recommended upon discharge #5. Hyponatremia, some improvement today with IV fluid administration, likely hypo-pseudo-hyponatremia due to hyperglycemia, urinary osmolality is high at 691, likely SIADH. Stop IV  fluids, initiate patient on fluid restriction #6. Anemia, awaiting for Hemoccult, patient's hemoglobin level was normal yesterday #7. Leukocytosis, resolved, repeating CBC in the morning #8. Hypokalemia, supplemented intravenously, magnesium level was checked, normal  All the records are reviewed and case discussed with ED provider. Management plans discussed with the patient, family and they are in agreement.  CODE STATUS: Full code  TOTAL CRITICAL CARE TIME TAKING CARE OF THIS PATIENT: 35 minutes.   Katharina CaperVAICKUTE,Tore Carreker M.D on 01/14/2016 at 12:24 PM  Between 7am to 6pm - Pager - [phone]  After 6pm go to [email or web address] - password [email or web address] [facility]  [facility]  Office  [phone]  CC: Primary care physician; No PCP Per Patient  Note: This dictation was prepared with Dragon dictation along with smaller phrase technology. Any transcriptional errors that result from this process are unintentional.

## 2016-01-14 NOTE — Progress Notes (Signed)
Pt continues to ask for snacks, ice cream, sandwiches, etc. This shift. Explained and provided education to pt on diabetes, importance of glycemic control, risks and medical issues r/t his diagnosis. Pt states he is aware of this, but he still feels hungry. Pt offered low-carb snacks, pt will call dining to place order. Spoke with dietary who stated that pt has reached his carb intake for meals/snacks. Conveyed this information to pt, he stated he will "just wait". Will continue to monitor.

## 2016-01-15 LAB — CBC
HCT: 37.6 % — ABNORMAL LOW (ref 40.0–52.0)
Hemoglobin: 13.2 g/dL (ref 13.0–18.0)
MCH: 30.5 pg (ref 26.0–34.0)
MCHC: 35.2 g/dL (ref 32.0–36.0)
MCV: 86.7 fL (ref 80.0–100.0)
Platelets: 229 10*3/uL (ref 150–440)
RBC: 4.34 MIL/uL — ABNORMAL LOW (ref 4.40–5.90)
RDW: 12.4 % (ref 11.5–14.5)
WBC: 7.8 10*3/uL (ref 3.8–10.6)

## 2016-01-15 LAB — BASIC METABOLIC PANEL
Anion gap: 7 (ref 5–15)
BUN: 11 mg/dL (ref 6–20)
CO2: 29 mmol/L (ref 22–32)
Calcium: 8.7 mg/dL — ABNORMAL LOW (ref 8.9–10.3)
Chloride: 101 mmol/L (ref 101–111)
Creatinine, Ser: 0.53 mg/dL — ABNORMAL LOW (ref 0.61–1.24)
GFR calc Af Amer: >60 mL/min (ref >60)
GFR calc non Af Amer: >60 mL/min (ref >60)
Glucose, Bld: 136 mg/dL — ABNORMAL HIGH (ref 65–99)
Potassium: 2.8 mmol/L — CL (ref 3.5–5.1)
Sodium: 137 mmol/L (ref 135–145)

## 2016-01-15 LAB — GLUCOSE, CAPILLARY
Glucose-Capillary: 323 mg/dL — ABNORMAL HIGH (ref 65–99)
Glucose-Capillary: 197 mg/dL — ABNORMAL HIGH (ref 65–99)

## 2016-01-15 LAB — POTASSIUM: Potassium: 3.9 mmol/L (ref 3.5–5.1)

## 2016-01-15 MED ORDER — NICOTINE 21 MG/24HR TD PT24: 21.0000 mg | MEDICATED_PATCH | Freq: Every day | TRANSDERMAL | Status: DC

## 2016-01-15 MED ORDER — POTASSIUM CHLORIDE 10 MEQ/100ML IV SOLN
10.0000 meq | INTRAVENOUS | Status: AC
  Administered 2016-01-15 (×4): 10 meq via INTRAVENOUS
  Filled 2016-01-15 (×4): qty 100

## 2016-01-15 MED ORDER — POTASSIUM CHLORIDE CRYS ER 20 MEQ PO TBCR
40.0000 meq | EXTENDED_RELEASE_TABLET | Freq: Once | ORAL | Status: AC
  Administered 2016-01-15: 40 meq via ORAL
  Filled 2016-01-15: qty 2

## 2016-01-15 MED ORDER — POTASSIUM CHLORIDE 10 MEQ/100ML IV SOLN
10.0000 meq | INTRAVENOUS | Status: DC
  Filled 2016-01-15 (×2): qty 100

## 2016-01-15 MED ORDER — ENOXAPARIN SODIUM 30 MG/0.3ML ~~LOC~~ SOLN: 30.0000 mg | SUBCUTANEOUS | Status: DC

## 2016-01-15 MED ORDER — MAGNESIUM SULFATE 2 GM/50ML IV SOLN
2.0000 g | Freq: Once | INTRAVENOUS | Status: AC
  Administered 2016-01-15: 2 g via INTRAVENOUS
  Filled 2016-01-15: qty 50

## 2016-01-15 MED ORDER — MUPIROCIN 2 % EX OINT: TOPICAL_OINTMENT | Freq: Two times a day (BID) | CUTANEOUS | Status: DC

## 2016-01-15 MED ORDER — OXYCODONE HCL 5 MG PO TABS: 5.0000 mg | ORAL_TABLET | ORAL | Status: DC | PRN

## 2016-01-15 MED ORDER — SULFAMETHOXAZOLE-TRIMETHOPRIM 800-160 MG PO TABS: 1.0000 | ORAL_TABLET | Freq: Two times a day (BID) | ORAL | Status: DC

## 2016-01-15 MED ORDER — MUPIROCIN 2 % EX OINT
TOPICAL_OINTMENT | Freq: Two times a day (BID) | CUTANEOUS | Status: DC
  Filled 2016-01-15: qty 22

## 2016-01-15 NOTE — Progress Notes (Signed)
   01/15/16 0900  Clinical Encounter Type  Visited With Patient  Visit Type Initial  Spiritual Encounters  Spiritual Needs Other (Comment) (non-identified)  Stress Factors  Patient Stress Factors Not reviewed  Family Stress Factors Not reviewed  pt was distant and appeared unsettled.  Follow up might be encouraged.

## 2016-01-15 NOTE — Progress Notes (Addendum)
Pt discharged home with new meds bactroban ,oxycodone,nicoderm and septra. I/s on new and chg  Meds. Left ambulatory

## 2016-01-15 NOTE — Discharge Summary (Signed)
[facility] [place] [facility]   PATIENT NAME: Trevor Brown    MR#:  [id number]  DATE OF BIRTH:  04-01-1991  DATE OF ADMISSION:  01/12/2016 ADMITTING PHYSICIAN: Milagros LollSrikar Sudini, MD  DATE OF DISCHARGE: No discharge date for patient encounter.  PRIMARY CARE PHYSICIAN: No PCP Per Patient     ADMISSION DIAGNOSIS:  Dehydration [E86.0] Chest pain, unspecified chest pain type [R07.9] Diabetic ketoacidosis without coma associated with type 1 diabetes mellitus (HCC) [E10.10]  DISCHARGE DIAGNOSIS:  Principal Problem:   DKA (diabetic ketoacidoses) (HCC) Active Problems:   Hyponatremia   Cocaine abuse   Focal myositis   Anemia   Leukocytosis   Tobacco abuse   SECONDARY DIAGNOSIS:   Past Medical History  Diagnosis Date  . Diabetes mellitus without complication (HCC)   . Hepatitis C, acute may 2016  . Heart attack (HCC)     Pt claims he had heart attack a year ago ( 2015)- and was admitted in [facility] for that, but not given any meds or angiogram, on review of chart- I could not find any details like that.    .pro HOSPITAL COURSE:   Patient is 25 year old Caucasian male with medical history significant for history of diabetes mellitus type 1, cocaine, tobacco abuse, noncompliance, who presents to the hospital with DKA. Apparently patient was admitted for DKA for one day ago, and left AGAINST MEDICAL ADVICE, only to return back with weakness and hyperglycemia. On arrival to emergency room, patient was noted to be in severe acidosis with bicarbonate level less than of 7, blood glucose levels were in 500s. He was tachypneic. He was admitted to the hospital for further evaluation and treatment, initiated on insulin IV drip, IV fluids, and improved. Initial labs revealed acidosis, hyperglycemia, leukocytosis, hyponatremia. Chest x-ray was unremarkable. Urinalysis was unremarkable. Urine drug screen was positive for cocaine. Patient admitted of injecting cocaine  recently. He complained of right forearm pain and swelling and admitted of injecting cocaine in  the right forearm. At that point an  MRI of his forearm was performed revealing focal cellulitis and myositis involving right upper radial aspect of forearm. Small hematoma was suspected but no definite abscess was noted. The patient complained of right upper extremity swelling and pain, erythematous blotch and swelling was noted in upper medial right forearm, concerning for infection, patient was initiated on Septra DS with improvement of his condition, decrease of swelling and redness. While in the hospital patient was intermittently noted to have low potassium levels, which were supplemented intravenously as well as orally. Discussion by problem: #1 DKA due to noncompliance, resolved on conservative therapy, patient is to resume his outpatient medications,  follow blood glucose levels closely as outpatient and [facility]. Patient's hemoglobin A1c was checked, was found to be markedly elevated to 13.2, due to noncompliance  #2. Cocaine abuse with recent injection in right forearm with resultant muscle, subcutaneous tissue inflammation and suspected infection. Patient's white blood cell count was normal, however. The patient was initiated on Septra DS, he was recommended to follow-up with primary care physician for further recommendations and recommended to stop using cocaine #3 right forearm swelling, likely myositis as well as likely cellulitis, continue Septra DS for 9 more days to complete 10 day course. Patient's blood cultures 2 were negative , mild temperature elevation subsided with antibiotic initiation #4. Tobacco abuse, discussed with patient for 3 minutes, nicotine replacement therapy was recommended upon discharge #5. 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 #6. Anemia, patient's hemoglobin level was normal yesterday, hemoglobin level of 13.2 #7. Leukocytosis, resolved #8. 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 Bee Venom Anaphylaxis  . Penicillins Anaphylaxis, Hives and Other (See Comments)    Has patient had a PCN reaction causing immediate rash, facial/tongue/throat swelling, SOB or lightheadedness with hypotension: Yes Has patient had a PCN reaction causing severe rash involving mucus membranes or skin necrosis: No Has patient had a PCN reaction that required hospitalization No Has patient had a PCN reaction occurring within the last 10 years: Yes If all of the above answers are "NO", then may proceed with Cephalosporin use.  . Ibuprofen Other (See Comments)    Reaction:  GI upset   . Tramadol Hives and Swelling  . Vancomycin Rash and Other (See Comments)    Reaction:  Red man's syndrome     DISCHARGE MEDICATIONS:   Current Discharge Medication List    START taking these medications   Details  nicotine (NICODERM CQ - DOSED IN MG/24 HOURS) 21 mg/24hr patch Place 1 patch (21 mg total) onto the skin daily. Qty: 28 patch, Refills: 0    sulfamethoxazole-trimethoprim (BACTRIM DS,SEPTRA DS) 800-160 MG tablet Take 1 tablet by mouth every 12 (twelve) hours. Qty: 18 tablet, Refills: 0      CONTINUE these medications which have CHANGED   Details  insulin aspart protamine- aspart (NOVOLOG MIX 70/30) (70-30) 100 UNIT/ML injection Inject 0.24 mLs (24 Units total) into the skin 2 (two) times daily with a meal. Qty: 10 mL, Refills: 11    oxyCODONE (OXY IR/ROXICODONE) 5 MG immediate release tablet Take 1 tablet (5 mg total) by mouth every 4 (four)  hours as needed for moderate pain or severe pain. Qty: 15 tablet, Refills: 0      CONTINUE these medications which have NOT CHANGED   Details  pantoprazole (PROTONIX) 40 MG tablet Take 1 tablet (40 mg total) by mouth daily. Qty: 30 tablet, Refills: 0      STOP taking these medications     clindamycin (CLEOCIN) 300 MG capsule          DISCHARGE INSTRUCTIONS:    Patient is to follow-up with primary care physician as outpatient  If you experience worsening of your admission symptoms, develop shortness of breath, life threatening emergency, suicidal or homicidal thoughts you must seek medical attention immediately by calling 911 or calling your MD immediately  if symptoms less severe.  You Must read complete instructions/literature along with all the possible adverse reactions/side effects for all the Medicines you take and that have been prescribed to you. Take any new Medicines after you have completely understood and accept all the possible adverse reactions/side effects.   Please note  You were cared for by a hospitalist during your hospital stay. If you have any questions about your discharge medications or the care you received while you were in the hospital after you are discharged, you can call the unit and asked to speak with the hospitalist on call if the hospitalist that took care of you is not available. Once you are discharged, your primary care physician will handle  any further medical issues. Please note that NO REFILLS for any discharge medications will be authorized once you are discharged, as it is imperative that you return to your primary care physician (or establish a relationship with a primary care physician if you do not have one) for your aftercare needs so that they can reassess your need for medications and monitor your lab values.    Today   CHIEF COMPLAINT:   Chief Complaint  Patient presents with  . Chest Pain  . Hyperventilating  . Hyperglycemia  .  Diabetic Ketoacidosis    HISTORY OF PRESENT ILLNESS:  Amadi Frady  is a 25 y.o. male with a known history of diabetes mellitus type 1, cocaine, tobacco abuse, noncompliance, who presents to the hospital with DKA. Apparently patient was admitted for DKA for one day ago, and left AGAINST MEDICAL ADVICE, only to return back with weakness and hyperglycemia. On arrival to emergency room, patient was noted to be in severe acidosis with bicarbonate level less than of 7, blood glucose levels were in 500s. He was tachypneic. He was admitted to the hospital for further evaluation and treatment, initiated on insulin IV drip, IV fluids, and improved. Initial labs revealed acidosis, hyperglycemia, leukocytosis, hyponatremia. Chest x-ray was unremarkable. Urinalysis was unremarkable. Urine drug screen was positive for cocaine. Patient admitted of injecting cocaine recently. He complained of right forearm pain and swelling and admitted of injecting cocaine in  the right forearm. At that point an  MRI of his forearm was performed revealing focal cellulitis and myositis involving right upper radial aspect of forearm. Small hematoma was suspected but no definite abscess was noted. The patient complained of right upper extremity swelling and pain, erythematous blotch and swelling was noted in upper medial right forearm, concerning for infection, patient was initiated on Septra DS with improvement of his condition, decrease of swelling and redness. While in the hospital patient was intermittently noted to have low potassium levels, which were supplemented intravenously as well as orally. Discussion by problem: #1 DKA due to noncompliance, resolved on conservative therapy, patient is to resume his outpatient medications,  follow blood glucose levels closely as outpatient and [facility]. Patient's hemoglobin A1c was checked, was found to be markedly elevated to 13.2, due to  noncompliance  #2. Cocaine abuse with recent injection in right forearm with resultant muscle, subcutaneous tissue inflammation and suspected infection. Patient's white blood cell count was normal, however. The patient was initiated on Septra DS, he was recommended to follow-up with primary care physician for further recommendations and recommended to stop using cocaine #3 right forearm swelling, likely myositis as well as likely cellulitis, continue Septra DS for 9 more days to complete 10 day course. Patient's blood cultures 2 were negative , mild temperature elevation subsided with antibiotic initiation #4. Tobacco abuse, discussed with patient for 3 minutes, nicotine replacement therapy was recommended upon discharge #5. Hyponatremia, SIADH as judged by high urinary osmolarity , improved with fluid restriction, follow patient's sodium level as outpatient closely as patient is prone to dehydration due to poorly controlled diabetes. #6. Anemia, patient's hemoglobin level was normal yesterday, hemoglobin level of 13.2 #7. Leukocytosis, resolved #8. 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 rate 18, height  (1.626 m), weight 49.442 kg (109 lb),  SpO2 98 %.  I/O:    Intake/Output Summary (Last 24 hours) at 01/15/16 1543 Last data filed at 01/15/16 0936  Gross per 24 hour  Intake      0 ml  Output   1150 ml  Net  -1150 ml    PHYSICAL EXAMINATION:  GENERAL:  25 y.o.-year-old patient lying in the bed with no acute distress.  EYES: Pupils equal, round, reactive to light and accommodation. No scleral icterus. Extraocular muscles intact.  HEENT: Head atraumatic, normocephalic. Oropharynx and nasopharynx clear.  NECK:  Supple, no jugular venous distention. No thyroid  enlargement, no tenderness.  LUNGS: Normal breath sounds bilaterally, no wheezing, rales,rhonchi or crepitation. No use of accessory muscles of respiration.  CARDIOVASCULAR: S1, S2 normal. No murmurs, rubs, or gallops.  ABDOMEN: Soft, non-tender, non-distended. Bowel sounds present. No organomegaly or mass.  EXTREMITIES: No pedal edema, cyanosis, or clubbing. Right upper forearm localized swelling and painful palpation but no fluctuations  NEUROLOGIC: Cranial nerves II through XII are intact. Muscle strength 5/5 in all extremities. Sensation intact. Gait not checked.  PSYCHIATRIC: The patient is alert and oriented x 3.  SKIN: No obvious rash, lesion, or ulcer.   DATA REVIEW:   CBC  Recent Labs Lab 01/15/16 0425  WBC 7.8  HGB 13.2  HCT 37.6*  PLT 229    Chemistries   Recent Labs Lab 01/12/16 0554  01/14/16 0411  01/15/16 0425  NA 130*  < > 131*  --  137  K 4.6  < > 2.7*  < > 2.8*  CL 101  < > 97*  --  101  CO2 <7*  < > 26  --  29  GLUCOSE 542*  < > 382*  --  136*  BUN 16  < > 10  --  11  CREATININE 1.20  < > 0.61  --  0.53*  CALCIUM 8.5*  < > 8.0*  --  8.7*  MG  --   < > 1.9  --   --   AST 29  --   --   --   --   ALT 33  --   --   --   --   ALKPHOS 204*  --   --   --   --   BILITOT 1.8*  --   --   --   --   < > = values in this interval not displayed.  Cardiac Enzymes  Recent Labs Lab 01/12/16 0554  TROPONINI <0.03    Microbiology Results  Results for orders placed or performed during the hospital encounter of 01/12/16  CULTURE, BLOOD (ROUTINE X 2) w Reflex to ID Panel     Status: None (Preliminary result)   Collection Time: 01/13/16 11:22 AM  Result Value Ref Range Status   Specimen Description BLOOD LEFT WRIST  Final   Special Requests BOTTLES DRAWN AEROBIC AND ANAEROBIC  1CC  Final   Culture NO GROWTH 2 DAYS  Final   Report Status PENDING  Incomplete  CULTURE, BLOOD (ROUTINE X 2) w Reflex to ID Panel     Status: None (Preliminary result)   Collection  Time: 01/13/16 11:30 AM  Result Value Ref Range Status   Specimen Description BLOOD LEFT HAND  Final   Special Requests BOTTLES DRAWN AEROBIC AND ANAEROBIC  1CC  Final   Culture NO GROWTH 2 DAYS  Final   Report Status PENDING  Incomplete    RADIOLOGY:  No results found.  EKG:  Orders placed or performed during the hospital encounter of 01/12/16  . ED EKG  . ED EKG      Management plans discussed with the patient, family and they are in agreement.  CODE STATUS:     Code Status Orders        Start     Ordered   01/12/16 0858  Full code   Continuous     01/12/16 0858    Code Status History    Date Active Date Inactive Code Status Order ID Comments User Context   01/11/2016 12:35 AM 01/11/2016 11:20 PM Full Code [id number]  Gery Pray, MD Inpatient   12/12/2015 10:04 PM 12/14/2015  6:38 PM Full Code [id number]  Katharina Caper, MD Inpatient   10/23/2015  5:42 PM 10/25/2015  7:04 PM Full Code [id number]  Katha Hamming, MD ED   06/10/2015  8:20 AM 06/12/2015  2:41 PM Full Code [id number]  Arnaldo Natal, MD Inpatient   06/06/2015  8:43 PM 06/07/2015  4:20 PM Full Code [id number]  Enedina Finner, MD Inpatient   04/07/2015 12:45 PM 04/08/2015  8:37 PM Full Code [id number]  Altamese Dilling, MD Inpatient   12/09/2014  2:44 PM 12/13/2014  4:16 PM Full Code [id number]  Gale Journey, MD Inpatient      TOTAL TIME TAKING CARE OF THIS PATIENT: 40  minutes.    Katharina Caper M.D on 01/15/2016 at 3:43 PM  Between 7am to 6pm - Pager - [phone]  After 6pm go to [email or web address] - password [email or web address] [facility]  [place] [facility]  Office  [phone]  CC: Primary care physician; No PCP Per Patient

## 2016-01-15 NOTE — Progress Notes (Signed)
Patient complaints of IV burning, MD aware ok to reduce to 5850ml/hr.

## 2016-01-15 NOTE — Care Management (Signed)
Again me with patient. Application and referrals to [facility] and [facility]. No further RNCM needs.

## 2016-01-15 NOTE — Progress Notes (Signed)
Pharmacist- Physician Communication   Medication: Enoxaparin  Description: Based on patients weight <50kg, patient qualifies for enoxaparin 30mg  daily dosing per [facility] Policy. Pharmacy has dose adjusted patients enoxaparin from 40mg  daily to 30mg  daily.  Cher NakaiSheema Rielly Brunn, PharmD Clinical Pharmacist 01/15/2016 9:51 AM

## 2016-01-15 NOTE — Progress Notes (Signed)
[facility] [place] [facility]   PATIENT NAME: Trevor Brown   MR#: [id number]  DATE OF BIRTH: [birth year]  DATE OF ADMISSION: 01/12/2016  PRIMARY CARE PHYSICIAN: No PCP Per Patient   REQUESTING/REFERRING PHYSICIAN: Dr. Zenda AlpersWebster  CHIEF COMPLAINT:   Chief Complaint  Patient presents with  . Chest Pain  . Hyperventilating  . Hyperglycemia  . Diabetic Ketoacidosis    HISTORY OF PRESENT ILLNESS:  Patient is 25 year old Caucasian male with medical history significant for history of diabetes mellitus type 1, cocaine, tobacco abuse, noncompliance, who presents to the hospital with DKA. Apparently patient was admitted for DKA for one day ago, and left AGAINST MEDICAL ADVICE, only to return back with weakness and hyperglycemia. On arrival to emergency room, patient was noted to be in severe acidosis with bicarbonate level less than of 7, blood glucose levels were in 500s. He was tachypneic. He was admitted to the hospital for further evaluation and treatment, initiated on insulin IV drip, IV fluids, and improved. Initial labs revealed acidosis, hyperglycemia, leukocytosis, hyponatremia. Chest x-ray was unremarkable. Urinalysis was unremarkable. Urine drug screen was positive for cocaine. Patient admitted of injecting cocaine recently. He complained of right forearm pain and swelling and admitted of injecting cocaine in the right forearm. At that point an MRI of his forearm was performed revealing focal cellulitis and myositis involving right upper radial aspect of forearm. Small hematoma was suspected but no definite abscess was noted. Today, patient complains of right upper extremity swelling and pain, today, with erythematous blotch on the top of the area. Patient requests more pain medications. Blood glucose levels are high to 400s, requiring additional insulin. Potassium was found to be level low at 2.7, being supplemented intravenously.  PAST  MEDICAL HISTORY:   Past Medical History  Diagnosis Date  . Diabetes mellitus without complication (HCC)   . Hepatitis C, acute may 2016  . Heart attack (HCC)     Pt claims he had heart attack a year ago ( 2015)- and was admitted in [facility] for that, but not given any meds or angiogram, on review of chart- I could not find any details like that.    PAST SURGICAL HISTORY:  History reviewed. No pertinent past surgical history.  SOCIAL HISTORY:   Social History  Substance Use Topics  . Smoking status: Current Every Day Smoker -- 1.50 packs/day    Types: Cigarettes  . Smokeless tobacco: Not on file  . Alcohol Use: No    FAMILY HISTORY:   Family History  Problem Relation Age of Onset  . Cirrhosis Mother   . Diabetes Mellitus II Maternal Grandmother     DRUG ALLERGIES:   Allergies  Allergen Reactions  . Bee Venom Anaphylaxis  . Penicillins Anaphylaxis, Hives and Other (See Comments)    Has patient had a PCN reaction causing immediate rash, facial/tongue/throat swelling, SOB or lightheadedness with hypotension: Yes Has patient had a PCN reaction causing severe rash involving mucus membranes or skin necrosis: No Has patient had a PCN reaction that required hospitalization No Has patient had a PCN reaction occurring within the last 10 years: Yes If all of the above answers are "NO", then may proceed with Cephalosporin use.  . Ibuprofen Other (See Comments)    Reaction: GI upset   . Tramadol Hives and Swelling  . Vancomycin Rash and Other (See Comments)    Reaction: Red man's syndrome     REVIEW OF SYSTEMS:   Review of Systems  Constitutional: Positive  for chills, weight loss and malaise/fatigue. Negative for fever.  HENT: Negative for sore throat.  Eyes: Negative for blurred vision, double vision and pain.  Respiratory: Positive for cough. Negative for hemoptysis, shortness of  breath and wheezing.  Cardiovascular: Positive for chest pain. Negative for palpitations, orthopnea and leg swelling.  Gastrointestinal: Positive for nausea, vomiting, abdominal pain and diarrhea. Negative for heartburn and constipation.  Genitourinary: Negative for dysuria and hematuria.  Musculoskeletal: Positive for myalgias. Negative for back pain and joint pain.  Skin: Negative for rash.  Neurological: Positive for dizziness and weakness. Negative for sensory change, speech change, focal weakness and headaches.  Endo/Heme/Allergies: Does not bruise/bleed easily.  Psychiatric/Behavioral: Negative for depression. The patient is not nervous/anxious.    MEDICATIONS AT HOME:   Prior to Admission medications   Medication Sig Start Date End Date Taking? Authorizing Provider  clindamycin (CLEOCIN) 300 MG capsule Take 1 capsule by mouth 3 (three) times daily. Reported on 01/10/2016 01/02/16 01/12/16 Yes Historical Provider, MD  insulin aspart protamine- aspart (NOVOLOG MIX 70/30) (70-30) 100 UNIT/ML injection Inject 0.24 mLs (24 Units total) into the skin 2 (two) times daily with a meal. 12/14/15  Yes Auburn Bilberry, MD  oxyCODONE (OXY IR/ROXICODONE) 5 MG immediate release tablet Take 5 mg by mouth every 6 (six) hours as needed. 01/02/16  Yes Historical Provider, MD  pantoprazole (PROTONIX) 40 MG tablet Take 1 tablet (40 mg total) by mouth daily. 12/14/15  Yes Auburn Bilberry, MD    VITAL SIGNS:  Blood pressure 118/88, pulse 66, temperature 98 F (36.7 C), temperature source Oral, resp. rate 18, height  (1.626 m), weight 52.527 kg (115 lb 12.8 oz), SpO2 100 %.  PHYSICAL EXAMINATION:  Physical Exam  GENERAL: 25 y.o.-year-old patient lying in the bed , Looks critically ill.  Neck. EYES: Pupils equal, round, reactive to light and accommodation. No scleral icterus. Extraocular muscles intact.  HEENT: Head atraumatic, normocephalic. Oropharynx and  nasopharynx clear. No oropharyngeal erythema, moist oral mucosa  NECK: Supple, no jugular venous distention. No thyroid enlargement, no tenderness.  LUNGS: Increased work of breathing. Clear to auscultation on both sides. Tenderness right side of the chest CARDIOVASCULAR: S1, S2 normal. No murmurs, rubs, or gallops.  ABDOMEN: Soft, nondistended. Bowel sounds present. No organomegaly or mass. Epigastric tenderness. EXTREMITIES: No pedal edema, cyanosis, or clubbing. + 2 pedal & radial pulses b/l. Right upper extremity proximal forearm area medially has swelling, erythematous area, of approximately 2 inches in diameter, with significant pain on palpation, no fluctuations were felt, mildly increased warmth of the skin NEUROLOGIC: Cranial nerves II through XII are intact. No focal Motor or sensory deficits appreciated b/l PSYCHIATRIC: The patient is alert and oriented x 3. Anxious SKIN: No obvious rash, lesion, or ulcer.   LABORATORY PANEL:   CBC  Last Labs      Recent Labs Lab 01/13/16 1402  WBC 7.9  HGB 13.2  HCT 38.1*  PLT 240     ------------------------------------------------------------------------------------------------------------------  Chemistries   Last Labs      Recent Labs Lab 01/12/16 0554  01/14/16 0411  NA 130* < > 131*  K 4.6 < > 2.7*  CL 101 < > 97*  CO2 <7* < > 26  GLUCOSE 542* < > 382*  BUN 16 < > 10  CREATININE 1.20 < > 0.61  CALCIUM 8.5* < > 8.0*  MG --  < > 1.9  AST 29 --  --   ALT 33 --  --   ALKPHOS 204* --  --  BILITOT 1.8* --  --   < > = values in this interval not displayed.   ------------------------------------------------------------------------------------------------------------------  Cardiac Enzymes  Last Labs      Recent Labs Lab 01/12/16 0554  TROPONINI <0.03      ------------------------------------------------------------------------------------------------------------------  RADIOLOGY:   Imaging Results (Last 48 hours)    Mr Forearm Right W/o Cm  01/13/2016 CLINICAL DATA: Pain, swelling and redness for several days. EXAM: MRI OF THE RIGHT FOREARM WITHOUT CONTRAST TECHNIQUE: Multiplanar, multisequence MR imaging was performed. No intravenous contrast was administered. COMPARISON: None. FINDINGS: Nonspecific subcutaneous soft tissue swelling/ edema/fluid mainly along the radial aspect of the upper forearm. There is also mild associated underline myositis involving the palmar aspect of the extensor musculature. No focal drainable abscess for pyomyositis. Small more focal area of signal abnormality with slight increased T1 signal intensity could be a small hematoma. No findings for septic arthritis or osteomyelitis. IMPRESSION: Findings most consistent with focal cellulitis and myositis involving the upper radial aspect of the forearm. Small hematoma is also possible. No definite abscess and no findings for septic arthritis or osteomyelitis. Electronically Signed By: Rudie MeyerP. Gallerani M.D. On: 01/13/2016 13:09      IMPRESSION AND PLAN:   #1 DKA due to noncompliance, resolved on conservative therapy, patient is to resume his outpatient medications much changing insulin Lantus to NovoLog mix, following blood glucose levels closely, continue sliding scale insulin for now #2. Cocaine abuse with recent injection in right forearm with resultant inflammation, questionable infection. Patient's white blood cell count was normal, however. Inflammation is significant, now with erythematous area, initiate patient on Septra DS, patient recommended to follow-up with primary care physician for further recommendations and recommended to stop using cocaine #3 right forearm swelling, likely myositis, inflammatory or infectious, initiate antibiotic Septra DS. Patient's  blood cultures 2 are negative so far, however, patient's temperature increased to 99.7 today #4. Tobacco abuse, discussed with patient in the past, nicotine replacement therapy was recommended upon discharge #5. Hyponatremia, some improvement today with IV fluid administration, likely hypo-pseudo-hyponatremia due to hyperglycemia, urinary osmolality is high at 691, likely SIADH. Stop IV fluids, initiate patient on fluid restriction #6. Anemia, awaiting for Hemoccult, patient's hemoglobin level was normal yesterday #7. Leukocytosis, resolved, repeating CBC in the morning #8. Hypokalemia, supplemented intravenously, magnesium level was checked, normal  All the records are reviewed and case discussed with ED provider. Management plans discussed with the patient, family and they are in agreement.  CODE STATUS: Full code  TOTAL CRITICAL CARE TIME TAKING CARE OF THIS PATIENT: 35 minutes.   Katharina CaperVAICKUTE,Ladaysha Soutar M.D on 01/14/2016 at 12:24 PM  Between 7am to 6pm - Pager - [phone]  After 6pm go to [email or web address] - password [email or web address] [facility]  [facility]  Office [phone]  CC: Primary care physician; No PCP Per Patient  Note: This dictation was prepared with Dragon dictation along with smaller phrase technology. Any transcriptional errors that result from this process are unintentional.       Routing History     Date/Time From To Method   01/14/2016 12:40 PM Katharina Caperima Rayssa Atha, MD No Pcp Per Patient In Basket

## 2016-01-15 NOTE — Progress Notes (Signed)
Notified MD of pt K of 2.8 this am. Ordered 40 meq po x 1 and 10 meq iv x 2.

## 2016-01-18 LAB — CULTURE, BLOOD (ROUTINE X 2)
Culture: NO GROWTH
Culture: NO GROWTH

## 2016-02-18 ENCOUNTER — Inpatient Hospital Stay
Admission: EM | Admit: 2016-02-18 | Discharge: 2016-02-19 | DRG: 638 | Disposition: A | Discharge status: Home / Self Care | Payer: Self-pay | Attending: Internal Medicine | Admitting: Internal Medicine

## 2016-02-18 ENCOUNTER — Emergency Department: Payer: Self-pay

## 2016-02-18 DIAGNOSIS — F329 Major depressive disorder, single episode, unspecified: Secondary | ICD-10-CM | POA: Diagnosis present

## 2016-02-18 DIAGNOSIS — Z9119 Patient's noncompliance with other medical treatment and regimen: Secondary | ICD-10-CM

## 2016-02-18 DIAGNOSIS — B1920 Unspecified viral hepatitis C without hepatic coma: Secondary | ICD-10-CM | POA: Diagnosis present

## 2016-02-18 DIAGNOSIS — Z880 Allergy status to penicillin: Secondary | ICD-10-CM

## 2016-02-18 DIAGNOSIS — F17210 Nicotine dependence, cigarettes, uncomplicated: Secondary | ICD-10-CM | POA: Diagnosis present

## 2016-02-18 DIAGNOSIS — R059 Cough, unspecified: Secondary | ICD-10-CM

## 2016-02-18 DIAGNOSIS — R05 Cough: Secondary | ICD-10-CM

## 2016-02-18 DIAGNOSIS — R7989 Other specified abnormal findings of blood chemistry: Secondary | ICD-10-CM | POA: Diagnosis present

## 2016-02-18 DIAGNOSIS — E1010 Type 1 diabetes mellitus with ketoacidosis without coma: Principal | ICD-10-CM | POA: Diagnosis present

## 2016-02-18 DIAGNOSIS — Z794 Long term (current) use of insulin: Secondary | ICD-10-CM

## 2016-02-18 DIAGNOSIS — Z888 Allergy status to other drugs, medicaments and biological substances status: Secondary | ICD-10-CM

## 2016-02-18 DIAGNOSIS — Z833 Family history of diabetes mellitus: Secondary | ICD-10-CM

## 2016-02-18 DIAGNOSIS — I252 Old myocardial infarction: Secondary | ICD-10-CM

## 2016-02-18 DIAGNOSIS — Z9114 Patient's other noncompliance with medication regimen: Secondary | ICD-10-CM

## 2016-02-18 DIAGNOSIS — E1110 Type 2 diabetes mellitus with ketoacidosis without coma: Secondary | ICD-10-CM | POA: Diagnosis present

## 2016-02-18 DIAGNOSIS — N179 Acute kidney failure, unspecified: Secondary | ICD-10-CM | POA: Diagnosis present

## 2016-02-18 LAB — BASIC METABOLIC PANEL
Anion gap: 19 — ABNORMAL HIGH (ref 5–15)
Anion gap: 9 (ref 5–15)
BUN: 20 mg/dL (ref 6–20)
BUN: 18 mg/dL (ref 6–20)
CO2: 11 mmol/L — ABNORMAL LOW (ref 22–32)
CO2: 19 mmol/L — ABNORMAL LOW (ref 22–32)
Calcium: 8.9 mg/dL (ref 8.9–10.3)
Calcium: 9 mg/dL (ref 8.9–10.3)
Chloride: 107 mmol/L (ref 101–111)
Chloride: 109 mmol/L (ref 101–111)
Creatinine, Ser: 1.17 mg/dL (ref 0.61–1.24)
Creatinine, Ser: 0.81 mg/dL (ref 0.61–1.24)
GFR calc Af Amer: >60 mL/min (ref >60)
GFR calc Af Amer: >60 mL/min (ref >60)
GFR calc non Af Amer: >60 mL/min (ref >60)
GFR calc non Af Amer: >60 mL/min (ref >60)
Glucose, Bld: 345 mg/dL — ABNORMAL HIGH (ref 65–99)
Glucose, Bld: 136 mg/dL — ABNORMAL HIGH (ref 65–99)
Potassium: 4.7 mmol/L (ref 3.5–5.1)
Potassium: 4.2 mmol/L (ref 3.5–5.1)
Sodium: 137 mmol/L (ref 135–145)
Sodium: 137 mmol/L (ref 135–145)

## 2016-02-18 LAB — BLOOD GAS, VENOUS
Acid-base deficit: 18 mmol/L — ABNORMAL HIGH (ref 0.0–2.0)
Bicarbonate: 8.8 mEq/L — ABNORMAL LOW (ref 21.0–28.0)
FIO2: 0.21
O2 Saturation: 70.5 %
Patient temperature: 37
pCO2, Ven: 24 mmHg — ABNORMAL LOW (ref 44.0–60.0)
pH, Ven: 7.17 — CL (ref 7.320–7.430)
pO2, Ven: 48 mmHg — ABNORMAL HIGH (ref 31.0–45.0)

## 2016-02-18 LAB — COMPREHENSIVE METABOLIC PANEL
ALT: 69 U/L — ABNORMAL HIGH (ref 17–63)
AST: 57 U/L — ABNORMAL HIGH (ref 15–41)
Albumin: 5 g/dL (ref 3.5–5.0)
Alkaline Phosphatase: 227 U/L — ABNORMAL HIGH (ref 38–126)
Anion gap: 28 — ABNORMAL HIGH (ref 5–15)
BUN: 23 mg/dL — ABNORMAL HIGH (ref 6–20)
CO2: 11 mmol/L — ABNORMAL LOW (ref 22–32)
Calcium: 9.9 mg/dL (ref 8.9–10.3)
Chloride: 94 mmol/L — ABNORMAL LOW (ref 101–111)
Creatinine, Ser: 1.31 mg/dL — ABNORMAL HIGH (ref 0.61–1.24)
GFR calc Af Amer: >60 mL/min (ref >60)
GFR calc non Af Amer: >60 mL/min (ref >60)
Glucose, Bld: 661 mg/dL (ref 65–99)
Potassium: 5 mmol/L (ref 3.5–5.1)
Sodium: 133 mmol/L — ABNORMAL LOW (ref 135–145)
Total Bilirubin: 1.3 mg/dL — ABNORMAL HIGH (ref 0.3–1.2)
Total Protein: 9.1 g/dL — ABNORMAL HIGH (ref 6.5–8.1)

## 2016-02-18 LAB — CBC
HCT: 47.9 % (ref 40.0–52.0)
Hemoglobin: 15.2 g/dL (ref 13.0–18.0)
MCH: 29.5 pg (ref 26.0–34.0)
MCHC: 31.8 g/dL — ABNORMAL LOW (ref 32.0–36.0)
MCV: 93 fL (ref 80.0–100.0)
Platelets: 309 10*3/uL (ref 150–440)
RBC: 5.15 MIL/uL (ref 4.40–5.90)
RDW: 14 % (ref 11.5–14.5)
WBC: 13 10*3/uL — ABNORMAL HIGH (ref 3.8–10.6)

## 2016-02-18 LAB — GLUCOSE, CAPILLARY
Glucose-Capillary: 102 mg/dL — ABNORMAL HIGH (ref 65–99)
Glucose-Capillary: 108 mg/dL — ABNORMAL HIGH (ref 65–99)
Glucose-Capillary: 128 mg/dL — ABNORMAL HIGH (ref 65–99)
Glucose-Capillary: 208 mg/dL — ABNORMAL HIGH (ref 65–99)
Glucose-Capillary: 214 mg/dL — ABNORMAL HIGH (ref 65–99)
Glucose-Capillary: 215 mg/dL — ABNORMAL HIGH (ref 65–99)
Glucose-Capillary: 223 mg/dL — ABNORMAL HIGH (ref 65–99)
Glucose-Capillary: 347 mg/dL — ABNORMAL HIGH (ref 65–99)
Glucose-Capillary: 448 mg/dL — ABNORMAL HIGH (ref 65–99)
Glucose-Capillary: 509 mg/dL (ref 65–99)
Glucose-Capillary: >600 mg/dL (ref 65–99)

## 2016-02-18 LAB — URINALYSIS COMPLETE WITH MICROSCOPIC (ARMC ONLY)
Bacteria, UA: NONE SEEN
Bilirubin Urine: NEGATIVE
Glucose, UA: >500 mg/dL — AB
Hgb urine dipstick: NEGATIVE
Leukocytes, UA: NEGATIVE
Nitrite: NEGATIVE
Protein, ur: NEGATIVE mg/dL
Specific Gravity, Urine: 1.025 (ref 1.005–1.030)
Squamous Epithelial / LPF: NONE SEEN
pH: 5 (ref 5.0–8.0)

## 2016-02-18 LAB — MRSA PCR SCREENING: MRSA by PCR: NEGATIVE

## 2016-02-18 MED ORDER — SODIUM CHLORIDE 0.9 % IV BOLUS (SEPSIS)
1000.0000 mL | Freq: Once | INTRAVENOUS | Status: AC
  Administered 2016-02-18: 1000 mL via INTRAVENOUS

## 2016-02-18 MED ORDER — SODIUM CHLORIDE 0.9 % IV SOLN: INTRAVENOUS | Status: DC

## 2016-02-18 MED ORDER — METOCLOPRAMIDE HCL 5 MG/ML IJ SOLN
INTRAMUSCULAR | Status: AC
  Administered 2016-02-18: 10 mg via INTRAVENOUS
  Filled 2016-02-18: qty 2

## 2016-02-18 MED ORDER — ONDANSETRON HCL 4 MG/2ML IJ SOLN
4.0000 mg | Freq: Once | INTRAMUSCULAR | Status: AC
  Administered 2016-02-18: 4 mg via INTRAVENOUS
  Filled 2016-02-18: qty 2

## 2016-02-18 MED ORDER — METOCLOPRAMIDE HCL 5 MG/ML IJ SOLN
10.0000 mg | Freq: Once | INTRAMUSCULAR | Status: AC
  Administered 2016-02-18: 10 mg via INTRAVENOUS

## 2016-02-18 MED ORDER — ACETAMINOPHEN 500 MG PO TABS
1000.0000 mg | ORAL_TABLET | Freq: Once | ORAL | Status: AC
  Administered 2016-02-18: 1000 mg via ORAL
  Filled 2016-02-18: qty 2

## 2016-02-18 MED ORDER — MORPHINE SULFATE (PF) 2 MG/ML IV SOLN
2.0000 mg | INTRAVENOUS | Status: DC | PRN
  Administered 2016-02-18: 2 mg via INTRAVENOUS
  Filled 2016-02-18: qty 1

## 2016-02-18 MED ORDER — ONDANSETRON HCL 4 MG/2ML IJ SOLN: 4.0000 mg | Freq: Four times a day (QID) | INTRAMUSCULAR | Status: DC | PRN

## 2016-02-18 MED ORDER — POTASSIUM CHLORIDE 10 MEQ/100ML IV SOLN
10.0000 meq | INTRAVENOUS | Status: AC
  Administered 2016-02-18 (×2): 10 meq via INTRAVENOUS
  Filled 2016-02-18 (×2): qty 100

## 2016-02-18 MED ORDER — SODIUM CHLORIDE 0.9 % IV SOLN
INTRAVENOUS | Status: DC
  Administered 2016-02-18 – 2016-02-19 (×2): via INTRAVENOUS
  Filled 2016-02-18 (×2): qty 2.5

## 2016-02-18 MED ORDER — ENOXAPARIN SODIUM 40 MG/0.4ML ~~LOC~~ SOLN
40.0000 mg | SUBCUTANEOUS | Status: DC
  Administered 2016-02-18: 40 mg via SUBCUTANEOUS
  Filled 2016-02-18: qty 0.4

## 2016-02-18 MED ORDER — MORPHINE SULFATE (PF) 4 MG/ML IV SOLN: 4.0000 mg | INTRAVENOUS | Status: DC | PRN

## 2016-02-18 MED ORDER — IBUPROFEN 600 MG PO TABS
600.0000 mg | ORAL_TABLET | Freq: Once | ORAL | Status: DC
  Filled 2016-02-18: qty 1

## 2016-02-18 MED ORDER — OXYCODONE HCL 5 MG PO TABS
5.0000 mg | ORAL_TABLET | ORAL | Status: DC | PRN
  Administered 2016-02-18 – 2016-02-19 (×5): 5 mg via ORAL
  Filled 2016-02-18 (×5): qty 1

## 2016-02-18 MED ORDER — SODIUM CHLORIDE 0.9 % IV SOLN: INTRAVENOUS | Status: AC

## 2016-02-18 MED ORDER — DEXTROSE-NACL 5-0.45 % IV SOLN
INTRAVENOUS | Status: DC
  Administered 2016-02-18 – 2016-02-19 (×2): via INTRAVENOUS

## 2016-02-18 NOTE — Progress Notes (Signed)
Not a readmit patient left AMA

## 2016-02-18 NOTE — ED Notes (Signed)
Patient transported to X-ray 

## 2016-02-18 NOTE — ED Notes (Signed)
MD at bedside. 

## 2016-02-18 NOTE — ED Triage Notes (Signed)
Pt presents via EMS from home with hyperglycemia. Insulin dependent diabetic without insulin x2 days. Reports generalized body pain and nausea. Hx DKA and medication noncompliance per pt. Does not have PCP.

## 2016-02-18 NOTE — Progress Notes (Signed)
Patient has tramadol allergy listed but reports no issues with oxycodone or hydrocodone.   Cindi Carbon, PharmD 02/18/16

## 2016-02-18 NOTE — ED Provider Notes (Signed)
[facility] Emergency Department Provider Note  Time seen: 1:38 PM  I have reviewed the triage vital signs and the nursing notes.   HISTORY  Chief Complaint No chief complaint on file.    HPI Trevor Brown is a 25 y.o. male with a past medical history of insulin-dependent diabetes who presents to the emergency department for abdominal pain, nausea, generalized weakness and muscle cramps. According to the patient he has been out of his insulin for the past 3 days due to finances and he does not have a primary care doctor. For the past 2 days he has been feeling muscle cramping including cramping in his abdomen, nausea or vomiting feeling very dehydrated. Patient states is generalized weakness worsened today so he came to the emergency department for evaluation. Patient's blood glucose read "high."  Past Medical History:  Diagnosis Date  . Diabetes mellitus without complication (HCC)   . Heart attack (HCC)    Pt claims he had heart attack a year ago ( 2015)- and was admitted in [facility] for that, but not given any meds or angiogram, on review of chart- I could not find any details like that.  . Hepatitis C, acute may 2016    Patient Active Problem List   Diagnosis Date Noted  . 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 Anemia 01/13/2016  . Leukocytosis 01/13/2016  . Nausea & vomiting 01/10/2016  . Tobacco abuse 01/10/2016  . Hyponatremia 12/12/2015  . Type 1 diabetes mellitus with hyperglycemia (HCC) 12/12/2015  . Chest pain 12/12/2015  . Elevated troponin 12/12/2015  . Cocaine abuse 12/12/2015  . DKA (diabetic ketoacidoses) (HCC) 06/10/2015  . Hidradenitis suppurativa of left axilla   . DKA, type 1 (HCC) 06/06/2015  . Malnutrition of moderate degree (HCC) 04/08/2015  . Hepatitis C 12/13/2014  . Diabetes type 1, uncontrolled (HCC) 12/11/2014  . Major depressive disorder, single episode, mild (HCC)   . Major depression, single episode 12/10/2014  . Diabetes mellitus  type 1, uncontrolled (HCC) 12/09/2014  . Transaminitis 12/09/2014  . Folliculitis 12/09/2014  . Homelessness 12/09/2014    No past surgical history on file.  Prior to Admission medications   Medication Sig Start Date End Date Taking? Authorizing Provider  insulin aspart protamine- aspart (NOVOLOG MIX 70/30) (70-30) 100 UNIT/ML injection Inject 0.24 mLs (24 Units total) into the skin 2 (two) times daily with a meal. 01/13/16   Katharina Caper, MD  mupirocin ointment (BACTROBAN) 2 % Place into the nose 2 (two) times daily. 01/15/16   Katharina Caper, MD  nicotine (NICODERM CQ - DOSED IN MG/24 HOURS) 21 mg/24hr patch Place 1 patch (21 mg total) onto the skin daily. 01/15/16   Katharina Caper, MD  oxyCODONE (OXY IR/ROXICODONE) 5 MG immediate release tablet Take 1 tablet (5 mg total) by mouth every 4 (four) hours as needed for moderate pain or severe pain. 01/15/16   Katharina Caper, MD  pantoprazole (PROTONIX) 40 MG tablet Take 1 tablet (40 mg total) by mouth daily. 12/14/15   Auburn Bilberry, MD  sulfamethoxazole-trimethoprim (BACTRIM DS,SEPTRA DS) 800-160 MG tablet Take 1 tablet by mouth every 12 (twelve) hours. 01/15/16   Katharina Caper, MD    Allergies  Allergen Reactions  . Bee Venom Anaphylaxis  . Penicillins Anaphylaxis, Hives and Other (See Comments)    Has patient had a PCN reaction causing immediate rash, facial/tongue/throat swelling, SOB or lightheadedness with hypotension: Yes Has patient had a PCN reaction causing severe rash involving mucus membranes or skin necrosis: No Has  patient had a PCN reaction that required hospitalization No Has patient had a PCN reaction occurring within the last 10 years: Yes If all of the above answers are "NO", then may proceed with Cephalosporin use.  . Ibuprofen Other (See Comments)    Reaction:  GI upset   . Tramadol Hives and Swelling  . Vancomycin Rash and Other (See Comments)    Reaction:  Red man's syndrome     Family History  Problem Relation Age of  Onset  . Cirrhosis Mother   . Diabetes Mellitus II Maternal Grandmother     Social History Social History  Substance Use Topics  . Smoking status: Current Every Day Smoker    Packs/day: 1.50    Types: Cigarettes  . Smokeless tobacco: Not on file  . Alcohol use No    Review of Systems Constitutional: Negative for fever. Cardiovascular: Negative for chest pain. Respiratory: Negative for shortness of breath. Gastrointestinal: Abdominal cramping. Positive for nausea or vomiting. Negative for diarrhea. Genitourinary: Negative for dysuria. Musculoskeletal: Positive for diffuse muscle cramping and pain. Neurological: Negative for headache 10-point ROS otherwise negative.  ____________________________________________   PHYSICAL EXAM:  Constitutional: Alert and oriented. Well appearing and in no distress. Eyes: Normal exam ENT   Head: Normocephalic and atraumatic.   Mouth/Throat: Dry mucous membranes, poor dentition. Cardiovascular: Normal rate, regular rhythm. No murmur Respiratory: Normal respiratory effort without tachypnea nor retractions. Breath sounds are clear Gastrointestinal: Soft and nontender. No distention.  Musculoskeletal: Nontender with normal range of motion in all extremities. Neurologic:  Normal speech and language. No gross focal neurologic deficits Psychiatric: Mood and affect are normal. Speech and behavior are normal.   ____________________________________________   INITIAL IMPRESSION / ASSESSMENT AND PLAN / ED COURSE  Pertinent labs & imaging results that were available during my care of the patient were reviewed by me and considered in my medical decision making (see chart for details).  The patient presents the emergency department with hyperglycemia, nausea and vomiting. Patient states his admitted for DKA 2 months ago. States this feels similar. We will check labs, IV hydrate while awaiting lab results.  The patient's labs have resulted  showing an acidotic pH of 7.17 with an elevated blood glucose of 661 and an anion gap of 28 consistent with diabetic ketoacidosis. Patient's potassium is currently 5.0, we will start on an infusion of insulin and admitted to the hospital for further treatment.  CRITICAL CARE Performed by: Minna Antis   Total critical care time: 30 minutes  Critical care time was exclusive of separately billable procedures and treating other patients.  Critical care was necessary to treat or prevent imminent or life-threatening deterioration.  Critical care was time spent personally by me on the following activities: development of treatment plan with patient and/or surrogate as well as nursing, discussions with consultants, evaluation of patient's response to treatment, examination of patient, obtaining history from patient or surrogate, ordering and performing treatments and interventions, ordering and review of laboratory studies, ordering and review of radiographic studies, pulse oximetry and re-evaluation of patient's condition.   ____________________________________________   FINAL CLINICAL IMPRESSION(S) / ED DIAGNOSES  Hyperglycemia Diabetic ketoacidosis   Minna Antis, MD 02/18/16 1453

## 2016-02-18 NOTE — Progress Notes (Signed)
Dr. Anne Hahn notified of insulin gtt at 0.8u/h per protocol MD needs notified.  Instructed to continue to monitor blood glucose and bmp's.

## 2016-02-18 NOTE — Progress Notes (Signed)
Order for enoxaparin 30 mg subcutaneously daily for DVT prophylaxis was changed to 40 mg per anticoagulation protocol for CrCl > 30 mL/min.  Cindi Carbon, PharmD 02/18/16

## 2016-02-18 NOTE — H&P (Signed)
[facility] Physicians - [place] at [facility]   PATIENT NAME: Trevor Brown    MR#:  [id number]  DATE OF BIRTH:  12/10/1990  DATE OF ADMISSION:  02/18/2016  PRIMARY CARE PHYSICIAN: No PCP Per Patient   REQUESTING/REFERRING PHYSICIAN: Minna Antis, MD  CHIEF COMPLAINT:   abd pain , nausea and vomiting  HISTORY OF PRESENT ILLNESS:  Trevor Brown  is a 25 y.o. male with a known history of Insulin-dependent diabetes mellitus, noncompliant with his medication, does not have a primary care physician, ran out of his insulin 3 days ago. Today he came into the ED with a chief complaint of abdominal pain associated with nausea, generalized weakness and muscle cramps. Patient's blood sugars reading was high at 661. Anion gap is at 28. Patient is started on insulin drip and IV fluids and hospitalist team is called to admit the patient. During my examination patient is reporting abdominal pain and asking for more pain medicine and antiemetics.  patient is a frequent flyer to the hospital and got admitted to the hospital many times with DKA. PAST MEDICAL HISTORY:   Past Medical History:  Diagnosis Date  . Diabetes mellitus without complication (HCC)   . Heart attack (HCC)    Pt claims he had heart attack a year ago ( 2015)- and was admitted in [facility] for that, but not given any meds or angiogram, on review of chart- I could not find any details like that.  . Hepatitis C, acute may 2016    PAST SURGICAL HISTOIRY:  No past surgical history on file.  SOCIAL HISTORY:   Social History  Substance Use Topics  . Smoking status: Current Every Day Smoker    Packs/day: 1.50    Types: Cigarettes  . Smokeless tobacco: Not on file  . Alcohol use No    FAMILY HISTORY:   Family History  Problem Relation Age of Onset  . Cirrhosis Mother   . Diabetes Mellitus II Maternal Grandmother     DRUG ALLERGIES:   Allergies  Allergen Reactions  . Bee Venom Anaphylaxis  . Penicillins  Anaphylaxis, Hives and Other (See Comments)    Has patient had a PCN reaction causing immediate rash, facial/tongue/throat swelling, SOB or lightheadedness with hypotension: Yes Has patient had a PCN reaction causing severe rash involving mucus membranes or skin necrosis: No Has patient had a PCN reaction that required hospitalization No Has patient had a PCN reaction occurring within the last 10 years: Yes If all of the above answers are "NO", then may proceed with Cephalosporin use.  . Ibuprofen Other (See Comments)    Reaction:  GI upset   . Tramadol Hives and Swelling  . Vancomycin Rash and Other (See Comments)    Reaction:  Red man's syndrome     REVIEW OF SYSTEMS:  CONSTITUTIONAL: No fever, fatigue or weakness.  EYES: No blurred or double vision.  EARS, NOSE, AND THROAT: No tinnitus or ear pain.  RESPIRATORY: No cough, shortness of breath, wheezing or hemoptysis.  CARDIOVASCULAR: No chest pain, orthopnea, edema.  GASTROINTESTINAL: Reporting nausea, and generalized abdominal pain. Denies vomiting, diarrhea. GENITOURINARY: No dysuria, hematuria.  ENDOCRINE: No polyuria, nocturia,  HEMATOLOGY: No anemia, easy bruising or bleeding SKIN: No rash or lesion. MUSCULOSKELETAL: No joint pain or arthritis.   NEUROLOGIC: No tingling, numbness, weakness.  PSYCHIATRY: No anxiety or depression.   MEDICATIONS AT HOME:   Prior to Admission medications   Medication Sig Start Date End Date Taking? Authorizing Provider  insulin aspart  protamine- aspart (NOVOLOG MIX 70/30) (70-30) 100 UNIT/ML injection Inject 0.24 mLs (24 Units total) into the skin 2 (two) times daily with a meal. 01/13/16  Yes Katharina Caper, MD  oxyCODONE (OXY IR/ROXICODONE) 5 MG immediate release tablet Take 1 tablet (5 mg total) by mouth every 4 (four) hours as needed for moderate pain or severe pain. 01/15/16  Yes Katharina Caper, MD      VITAL SIGNS:  Blood pressure 128/64, pulse 95, temperature 97.6 F (36.4 C), temperature  source Oral, resp. rate 16, height  (1.626 m), weight 59 kg (130 lb), SpO2 98 %.  PHYSICAL EXAMINATION:  GENERAL:  25 y.o.-year-old patient lying in the bed with no acute distress.  EYES: Pupils equal, round, reactive to light and accommodation. No scleral icterus. Extraocular muscles intact.  HEENT: Head atraumatic, normocephalic. Oropharynx and nasopharynx clear. Dry mucous membranes NECK:  Supple, no jugular venous distention. No thyroid enlargement, no tenderness.  LUNGS: Normal breath sounds bilaterally, no wheezing, rales,rhonchi or crepitation. No use of accessory muscles of respiration.  CARDIOVASCULAR: S1, S2 normal. No murmurs, rubs, or gallops.  ABDOMEN: Soft, Minimal diffuse tenderness is present no rebound tenderness , nondistended. Bowel sounds present. No organomegaly or mass.  EXTREMITIES: No pedal edema, cyanosis, or clubbing.  NEUROLOGIC: Cranial nerves II through XII are intact. Muscle strength 5/5 in all extremities. Sensation intact. Gait not checked.  PSYCHIATRIC: The patient is alert and oriented x 3.  SKIN: No obvious rash, lesion, or ulcer.   LABORATORY PANEL:   CBC  Recent Labs Lab 02/18/16 1343  WBC 13.0*  HGB 15.2  HCT 47.9  PLT 309   ------------------------------------------------------------------------------------------------------------------  Chemistries   Recent Labs Lab 02/18/16 1343  NA 133*  K 5.0  CL 94*  CO2 11*  GLUCOSE 661*  BUN 23*  CREATININE 1.31*  CALCIUM 9.9  AST 57*  ALT 69*  ALKPHOS 227*  BILITOT 1.3*   ------------------------------------------------------------------------------------------------------------------  Cardiac Enzymes No results for input(s): TROPONINI in the last 168 hours. ------------------------------------------------------------------------------------------------------------------  RADIOLOGY:  Dg Chest 2 View  Result Date: 02/18/2016 CLINICAL DATA:  Hyperglycemia EXAM: CHEST  2 VIEW  COMPARISON:  01/12/2016 FINDINGS: The heart size and mediastinal contours are within normal limits. Both lungs are clear. The visualized skeletal structures are unremarkable. IMPRESSION: No active cardiopulmonary disease. Electronically Signed   By: Alcide Clever M.D.   On: 02/18/2016 16:01   EKG:   Orders placed or performed during the hospital encounter of 01/12/16  . ED EKG  . ED EKG    IMPRESSION AND PLAN:   Hien Perreira  is a 25 y.o. male with a known history of Insulin-dependent diabetes mellitus, noncompliant with his medication, does not have a primary care physician, ran out of his insulin 3 days ago. Today he came into the ED with a chief complaint of abdominal pain associated with nausea, generalized weakness and muscle cramps. Patient's blood sugars reading was high at 661. Anion gap is at 28. Patient is started on insulin drip and IV fluids   #DKA secondary to noncompliance with medications   provide IV fluids and insulin drip BMP every 4 hours.  Change IV fluids to D5 half normal saline when blood sugar is less than 250  \will consider resuming his home insulin Novolin 70/30 once DKA is resolved check hemoglobin A1c  #Acute kidney injury Provide hydration with IV fluids and monitor renal function closely. Avoid nephrotoxins   #elevated LFTs with history of hepatitis C   avoid hepatotoxins and  monitor liver function closely   #chronic history of depression   currently stable. Denies any suicidal or homicidal ideation  #Noncompliance with medications Patient needs regular follow up with outpatient primary care physician. He does not have a primary care physician to follow-up  which is The cause of frequent admissions when he runs out of the insulin Consult is placed to case management regarding arrangement for primary care physician follow-ups after discharge   reinforced the importance of being compliant with his medications including insulin   consult diabetic  coordinator   #Tobacco abuse Counseled patient to quit smoking. He will think about it   DVT prophylaxis with Lovenox subcutaneous   All the records are reviewed and case discussed with ED provider. Management plans discussed with the patient, family and they are in agreement.  CODE STATUS: FC/DAD HCPOA  TOTAL CRITICAL CARE TIME TAKING CARE OF THIS PATIENT: 45 minutes.   Note: This dictation was prepared with Dragon dictation along with smaller phrase technology. Any transcriptional errors that result from this process are unintentional.  Ramonita Lab M.D on 02/18/2016 at 4:04 PM  Between 7am to 6pm - Pager - [phone]  After 6pm go to [email or web address] - password [email or web address] [facility]  [place] [facility]  Office  [phone]  CC: Primary care physician; No PCP Per Patient

## 2016-02-19 LAB — GLUCOSE, CAPILLARY
Glucose-Capillary: 103 mg/dL — ABNORMAL HIGH (ref 65–99)
Glucose-Capillary: 115 mg/dL — ABNORMAL HIGH (ref 65–99)
Glucose-Capillary: 143 mg/dL — ABNORMAL HIGH (ref 65–99)
Glucose-Capillary: 154 mg/dL — ABNORMAL HIGH (ref 65–99)
Glucose-Capillary: 169 mg/dL — ABNORMAL HIGH (ref 65–99)
Glucose-Capillary: 192 mg/dL — ABNORMAL HIGH (ref 65–99)
Glucose-Capillary: 224 mg/dL — ABNORMAL HIGH (ref 65–99)
Glucose-Capillary: 244 mg/dL — ABNORMAL HIGH (ref 65–99)
Glucose-Capillary: 245 mg/dL — ABNORMAL HIGH (ref 65–99)
Glucose-Capillary: 261 mg/dL — ABNORMAL HIGH (ref 65–99)

## 2016-02-19 LAB — BASIC METABOLIC PANEL
Anion gap: 13 (ref 5–15)
Anion gap: 6 (ref 5–15)
BUN: 17 mg/dL (ref 6–20)
BUN: 21 mg/dL — ABNORMAL HIGH (ref 6–20)
CO2: 16 mmol/L — ABNORMAL LOW (ref 22–32)
CO2: 20 mmol/L — ABNORMAL LOW (ref 22–32)
Calcium: 8.9 mg/dL (ref 8.9–10.3)
Calcium: 8.5 mg/dL — ABNORMAL LOW (ref 8.9–10.3)
Chloride: 107 mmol/L (ref 101–111)
Chloride: 104 mmol/L (ref 101–111)
Creatinine, Ser: 0.76 mg/dL (ref 0.61–1.24)
Creatinine, Ser: 0.76 mg/dL (ref 0.61–1.24)
GFR calc Af Amer: >60 mL/min (ref >60)
GFR calc Af Amer: >60 mL/min (ref >60)
GFR calc non Af Amer: >60 mL/min (ref >60)
GFR calc non Af Amer: >60 mL/min (ref >60)
Glucose, Bld: 116 mg/dL — ABNORMAL HIGH (ref 65–99)
Glucose, Bld: 226 mg/dL — ABNORMAL HIGH (ref 65–99)
Potassium: 3.9 mmol/L (ref 3.5–5.1)
Potassium: 3.6 mmol/L (ref 3.5–5.1)
Sodium: 136 mmol/L (ref 135–145)
Sodium: 130 mmol/L — ABNORMAL LOW (ref 135–145)

## 2016-02-19 LAB — HEMOGLOBIN A1C: Hgb A1c MFr Bld: 13.4 % — ABNORMAL HIGH (ref 4.0–6.0)

## 2016-02-19 MED ORDER — INSULIN ASPART 100 UNIT/ML ~~LOC~~ SOLN
4.0000 [IU] | Freq: Three times a day (TID) | SUBCUTANEOUS | Status: DC
  Administered 2016-02-19: 4 [IU] via SUBCUTANEOUS
  Filled 2016-02-19: qty 4

## 2016-02-19 MED ORDER — INSULIN ASPART PROT & ASPART (70-30 MIX) 100 UNIT/ML ~~LOC~~ SUSP: 24.0000 [IU] | Freq: Two times a day (BID) | SUBCUTANEOUS | 1 refills | Status: DC

## 2016-02-19 MED ORDER — INSULIN ASPART 100 UNIT/ML ~~LOC~~ SOLN
0.0000 [IU] | Freq: Three times a day (TID) | SUBCUTANEOUS | Status: DC
Start: 2016-02-19 — End: 2016-02-19
  Administered 2016-02-19: 5 [IU] via SUBCUTANEOUS
  Filled 2016-02-19: qty 5

## 2016-02-19 MED ORDER — MORPHINE SULFATE (PF) 2 MG/ML IV SOLN: 1.0000 mg | INTRAVENOUS | Status: DC | PRN

## 2016-02-19 MED ORDER — INSULIN GLARGINE 100 UNIT/ML ~~LOC~~ SOLN
20.0000 [IU] | SUBCUTANEOUS | Status: DC
  Administered 2016-02-19: 20 [IU] via SUBCUTANEOUS
  Filled 2016-02-19: qty 0.2

## 2016-02-19 MED ORDER — INSULIN ASPART 100 UNIT/ML ~~LOC~~ SOLN
0.0000 [IU] | Freq: Every day | SUBCUTANEOUS | Status: DC
Start: 2016-02-19 — End: 2016-02-19

## 2016-02-19 NOTE — Progress Notes (Signed)
Patient alert and oriented. Titrated off of insulin drip, lantus and sub q ordered. Tolerating diet and using urinal. Care manager and Diabetes coordinator consults entered.

## 2016-02-19 NOTE — Progress Notes (Signed)
Dr. Sheryle Hail notified about pt's lab work and restarting of insulin gtt. New orders placed and will continue to monitor and notify MD as needed.

## 2016-02-19 NOTE — Discharge Instructions (Signed)
Diabetic diet  Activity as tolerated  Check blood sugars 4 times a day before meals and at bedtime

## 2016-02-19 NOTE — Progress Notes (Addendum)
Inpatient Diabetes Program Recommendations  AACE/ADA: New Consensus Statement on Inpatient Glycemic Control (2015)  Target Ranges:  Prepandial:   less than 140 mg/dL      Peak postprandial:   less than 180 mg/dL (1-2 hours)      Critically ill patients:  140 - 180 mg/dL   Results for Trevor Brown, Trevor Brown (MRN [id number]) as of 02/19/2016 10:21  Ref. Range 02/18/2016 13:43  Sodium Latest Ref Range: 135 - 145 mmol/L 133 (L)  Potassium Latest Ref Range: 3.5 - 5.1 mmol/L 5.0  Chloride Latest Ref Range: 101 - 111 mmol/L 94 (L)  CO2 Latest Ref Range: 22 - 32 mmol/L 11 (L)  BUN Latest Ref Range: 6 - 20 mg/dL 23 (H)  Creatinine Latest Ref Range: 0.61 - 1.24 mg/dL 1.31 (H)  Calcium Latest Ref Range: 8.9 - 10.3 mg/dL 9.9  EGFR (Non-African Amer.) Latest Ref Range: >60 mL/min >60  EGFR (African American) Latest Ref Range: >60 mL/min >60  Glucose Latest Ref Range: 65 - 99 mg/dL 661 (HH)  Anion gap Latest Ref Range: 5 - 15  28 (H)    Admit with: DKA (ran out of insulin 3 days prior)  History: Type 1 DM  Home DM Meds: 70/30 Insulin- 24 units bidwc  Current Insulin Orders: Lantus 20 units daily      Novolog Moderate Correction Scale/ SSI (0-15 units) TID AC + HS      Novolog 4 units tidwc    -Transitioned off IV Insulin drip this AM.  Given 20 units Lantus at 6:30am.  -Also started on Novolog this AM as well.  -Per Chart Review, note that patient was counseled at length by the Diabetes Coordinator on 11/03/15 regarding the importance of good glucose control at home.  Of note, patient has left AMA during different admissions.  Does Not have PCP not Insurance.  This is patient's 5th admission since April of this year.   Addendum 11:30am: Listened from doorway of pt's room while Care manager spoke with pt.  Patient has homeless, financial issues that will make it difficult for him to control his CBGs.  Note Care manager trying to get pt set up with [facility].  Patient is already on one of the  least expensive insulins available.  Pt can also get [facility] per Care Manager.  Just needs to get to his appts and provide proof of his living situation per Care Mgmt notes.    --Will follow patient during hospitalization--  Wyn Quaker RN, MSN, CDE Diabetes Coordinator Inpatient Glycemic Control Team Team Pager: [phone] (8a-5p)

## 2016-02-19 NOTE — Care Management (Signed)
Received callback from [place] Case worker with [employer]. She states that patient does come there to eat on occasion and that she had no information on "where" he was staying.  Since that time I learned that patient has transferred to [facility] and discharged to home shortly afterwards. I spoke with the CM covering that unit to see "how" he got home since he didn't have transportation. His father's listed contact number has been disconnected. He used LINK to get home with pass from social work department. Unfortunately there's nothing else the [facility] department can do. I have updated [facility] and [facility]. Case closed.

## 2016-02-19 NOTE — Care Management Note (Addendum)
Case Management Note  Patient Details  Name: Trevor Brown MRN: [id number] Date of Birth: 26-Aug-1990  Subjective/Objective:                  Unfortunately, this RNCM has met with this young man on several occassions to discuss establishing care to control his medical concerns. He agrees every time. Last visit he did get his medications filled at [facility] however he never followed up with [facility]. He now has a sore mouth and need dental care. He states he and his father have moved into a boarding house across from the homeless shelter/[facility] since I last spoke with his. He denies have a phone number or that his father has a phone. He states they no longer have a vehicle to get around. [facility] is walking distance from [employer] (for someone his age and ability). He is independent with daily activities. Getting meals at [employer]. [facility] states that "if we can get proof from the boarding house that he resides there and a copy of his photo ID they will be able to serve him medically". They can also make referrals for oral care.   Action/Plan: Applications and referrals again sent to [facility] and [facility]. I have left message for case manager Jovita Gamma at [employer] [phone] for assistance. I cannot locate a contact number and patient did not have a contact number for boarding house where patients states he lives. I have given this patient my work contact number again and asked that he call me with any issues getting treatment after he discharges- he agress.     Expected Discharge Date:                  Expected Discharge Plan:     In-House Referral:     Discharge planning Services  CM Consult, Medication Assistance, [facility]  Post Acute Care Choice:    Choice offered to:  Patient  DME Arranged:    DME Agency:     HH Arranged:    [place] Agency:     Status of Service:  In process, will continue to follow  If  discussed at Long Length of Stay Meetings, dates discussed:    Additional Comments:  Marshell Garfinkel, RN 02/19/2016, 11:30 AM

## 2016-02-20 NOTE — Discharge Summary (Signed)
[facility] Physicians - [place] at [facility]   PATIENT NAME: Trevor Brown    MR#:  [id number]  DATE OF BIRTH:  [birth year]  DATE OF ADMISSION:  02/18/2016 ADMITTING PHYSICIAN: Ramonita Lab, MD  DATE OF DISCHARGE: 02/19/2016  2:18 PM  PRIMARY CARE PHYSICIAN: No PCP Per Patient   ADMISSION DIAGNOSIS:  Cough [R05] Diabetic ketoacidosis without coma associated with type 1 diabetes mellitus (HCC) [E10.10]  DISCHARGE DIAGNOSIS:  Active Problems:   DKA (diabetic ketoacidoses) (HCC)   SECONDARY DIAGNOSIS:   Past Medical History:  Diagnosis Date  . Diabetes mellitus without complication (HCC)   . Heart attack (HCC)    Pt claims he had heart attack a year ago ( 2015)- and was admitted in [facility] for that, but not given any meds or angiogram, on review of chart- I could not find any details like that.  . Hepatitis C, acute may 2016     ADMITTING HISTORY  Trevor Brown  is a 25 y.o. male with a known history of Insulin-dependent diabetes mellitus, noncompliant with his medication, does not have a primary care physician, ran out of his insulin 3 days ago. Today he came into the ED with a chief complaint of abdominal pain associated with nausea, generalized weakness and muscle cramps. Patient's blood sugars reading was high at 661. Anion gap is at 28. Patient is started on insulin drip and IV fluids and hospitalist team is called to admit the patient. During my examination patient is reporting abdominal pain and asking for more pain medicine and antiemetics.  patient is a frequent flyer to the hospital and got admitted to the hospital many times with DKA.  HOSPITAL COURSE:   * DKA This was one of patient's many admissions for DKA due to noncompliance. He was treated with aggressive IV fluid resuscitation and IV insulin drip with every hour Accu-Cheks and electrolyte replacement. Patient improved and his blood sugars are well controlled at this point. He has been transitioned to  long-acting insulin. Prescriptions given for his insulin and counseled to be compliant.  Patient is stable for discharge.  Unfortunately patient is high risk for readmission due to noncompliance.  CONSULTS OBTAINED:  Treatment Team:  Ramonita Lab, MD  DRUG ALLERGIES:   Allergies  Allergen Reactions  . Bee Venom Anaphylaxis  . Penicillins Anaphylaxis, Hives and Other (See Comments)    Has patient had a PCN reaction causing immediate rash, facial/tongue/throat swelling, SOB or lightheadedness with hypotension: Yes Has patient had a PCN reaction causing severe rash involving mucus membranes or skin necrosis: No Has patient had a PCN reaction that required hospitalization No Has patient had a PCN reaction occurring within the last 10 years: Yes If all of the above answers are "NO", then may proceed with Cephalosporin use.  . Ibuprofen Other (See Comments)    Reaction:  GI upset   . Tramadol Hives and Swelling  . Vancomycin Rash and Other (See Comments)    Reaction:  Red man's syndrome     DISCHARGE MEDICATIONS:   Discharge Medication List as of 02/19/2016  1:39 PM    CONTINUE these medications which have CHANGED   Details  insulin aspart protamine- aspart (NOVOLOG MIX 70/30) (70-30) 100 UNIT/ML injection Inject 0.24 mLs (24 Units total) into the skin 2 (two) times daily with a meal., Starting Mon 02/19/2016, Print      STOP taking these medications     oxyCODONE (OXY IR/ROXICODONE) 5 MG immediate release tablet  Today   VITAL SIGNS:  Blood pressure 138/84, pulse 69, temperature 97.8 F (36.6 C), temperature source Oral, resp. rate 16, height  (1.626 m), weight 59 kg (130 lb), SpO2 100 %.  I/O:  No intake or output data in the 24 hours ending 02/20/16 1443  PHYSICAL EXAMINATION:  Physical Exam  GENERAL:  25 y.o.-year-old patient lying in the bed with no acute distress.  LUNGS: Normal breath sounds bilaterally, no wheezing, rales,rhonchi or crepitation. No  use of accessory muscles of respiration.  CARDIOVASCULAR: S1, S2 normal. No murmurs, rubs, or gallops.  ABDOMEN: Soft, non-tender, non-distended. Bowel sounds present. No organomegaly or mass.  NEUROLOGIC: Moves all 4 extremities. PSYCHIATRIC: The patient is alert and oriented x 3.  SKIN: No obvious rash, lesion, or ulcer.   DATA REVIEW:   CBC  Recent Labs Lab 02/18/16 1343  WBC 13.0*  HGB 15.2  HCT 47.9  PLT 309    Chemistries   Recent Labs Lab 02/18/16 1343  02/19/16 0506  NA 133*  < > 130*  K 5.0  < > 3.6  CL 94*  < > 104  CO2 11*  < > 20*  GLUCOSE 661*  < > 226*  BUN 23*  < > 21*  CREATININE 1.31*  < > 0.76  CALCIUM 9.9  < > 8.5*  AST 57*  --   --   ALT 69*  --   --   ALKPHOS 227*  --   --   BILITOT 1.3*  --   --   < > = values in this interval not displayed.  Cardiac Enzymes No results for input(s): TROPONINI in the last 168 hours.  Microbiology Results  Results for orders placed or performed during the hospital encounter of 02/18/16  MRSA PCR Screening     Status: None   Collection Time: 02/18/16  5:25 PM  Result Value Ref Range Status   MRSA by PCR NEGATIVE NEGATIVE Final    Comment:        The GeneXpert MRSA Assay (FDA approved for NASAL specimens only), is one component of a comprehensive MRSA colonization surveillance program. It is not intended to diagnose MRSA infection nor to guide or monitor treatment for MRSA infections.     RADIOLOGY:  Dg Chest 2 View  Result Date: 02/18/2016 CLINICAL DATA:  Hyperglycemia EXAM: CHEST  2 VIEW COMPARISON:  01/12/2016 FINDINGS: The heart size and mediastinal contours are within normal limits. Both lungs are clear. The visualized skeletal structures are unremarkable. IMPRESSION: No active cardiopulmonary disease. Electronically Signed   By: Alcide Clever M.D.   On: 02/18/2016 16:01   Follow up with PCP in 1 week.  Management plans discussed with the patient, family and they are in agreement.  CODE  STATUS:  Code Status History    Date Active Date Inactive Code Status Order ID Comments User Context   02/18/2016  4:59 PM 02/19/2016  5:18 PM Full Code [id number]  Ramonita Lab, MD ED   01/12/2016  8:58 AM 01/15/2016  7:33 PM Full Code [id number]  Milagros Loll, MD ED   01/11/2016 12:35 AM 01/11/2016 11:20 PM Full Code [id number]  Gery Pray, MD Inpatient   12/12/2015 10:04 PM 12/14/2015  6:38 PM Full Code [id number]  Katharina Caper, MD Inpatient   10/23/2015  5:42 PM 10/25/2015  7:04 PM Full Code [id number]  Katha Hamming, MD ED   06/10/2015  8:20 AM 06/12/2015  2:41 PM Full Code [id number]  Arnaldo Natal, MD Inpatient  06/06/2015  8:43 PM 06/07/2015  4:20 PM Full Code [id number]  Enedina Finner, MD Inpatient   04/07/2015 12:45 PM 04/08/2015  8:37 PM Full Code [id number]  Altamese Dilling, MD Inpatient   12/09/2014  2:44 PM 12/13/2014  4:16 PM Full Code [id number]  Gale Journey, MD Inpatient      TOTAL TIME TAKING CARE OF THIS PATIENT ON DAY OF DISCHARGE: more than 30 minutes.   Milagros Loll R M.D on 02/20/2016 at 2:43 PM  Between 7am to 6pm - Pager - [phone]  After 6pm go to [email or web address] - password [email or web address] [facility]  [place] Hospitalists  Office  [phone]  CC: Primary care physician; No PCP Per Patient  Note: This dictation was prepared with Dragon dictation along with smaller phrase technology. Any transcriptional errors that result from this process are unintentional.

## 2016-02-26 ENCOUNTER — Ambulatory Visit: Payer: Self-pay

## 2016-03-04 ENCOUNTER — Emergency Department
Admission: EM | Admit: 2016-03-04 | Discharge: 2016-03-04 | Disposition: A | Discharge status: Home / Self Care | Payer: Self-pay | Attending: Emergency Medicine | Admitting: Emergency Medicine

## 2016-03-04 ENCOUNTER — Encounter: Payer: Self-pay | Admitting: Emergency Medicine

## 2016-03-04 DIAGNOSIS — F17210 Nicotine dependence, cigarettes, uncomplicated: Secondary | ICD-10-CM | POA: Insufficient documentation

## 2016-03-04 DIAGNOSIS — L739 Follicular disorder, unspecified: Secondary | ICD-10-CM

## 2016-03-04 DIAGNOSIS — Z794 Long term (current) use of insulin: Secondary | ICD-10-CM | POA: Insufficient documentation

## 2016-03-04 DIAGNOSIS — R739 Hyperglycemia, unspecified: Secondary | ICD-10-CM

## 2016-03-04 DIAGNOSIS — K029 Dental caries, unspecified: Secondary | ICD-10-CM

## 2016-03-04 DIAGNOSIS — E1065 Type 1 diabetes mellitus with hyperglycemia: Secondary | ICD-10-CM | POA: Insufficient documentation

## 2016-03-04 LAB — URINALYSIS COMPLETE WITH MICROSCOPIC (ARMC ONLY)
Bacteria, UA: NONE SEEN
Bilirubin Urine: NEGATIVE
Glucose, UA: >500 mg/dL — AB
Hgb urine dipstick: NEGATIVE
Leukocytes, UA: NEGATIVE
Nitrite: NEGATIVE
Protein, ur: NEGATIVE mg/dL
Specific Gravity, Urine: 1.032 — ABNORMAL HIGH (ref 1.005–1.030)
Squamous Epithelial / LPF: NONE SEEN
WBC, UA: NONE SEEN WBC/hpf (ref 0–5)
pH: 5 (ref 5.0–8.0)

## 2016-03-04 LAB — CBC WITH DIFFERENTIAL/PLATELET
Basophils Absolute: 0.1 10*3/uL (ref 0–0.1)
Basophils Relative: 1 %
Eosinophils Absolute: 0 10*3/uL (ref 0–0.7)
Eosinophils Relative: 0 %
HCT: 41.1 % (ref 40.0–52.0)
Hemoglobin: 13.8 g/dL (ref 13.0–18.0)
Lymphocytes Relative: 11 %
Lymphs Abs: 1.2 10*3/uL (ref 1.0–3.6)
MCH: 29.6 pg (ref 26.0–34.0)
MCHC: 33.5 g/dL (ref 32.0–36.0)
MCV: 88.4 fL (ref 80.0–100.0)
Monocytes Absolute: 0.5 10*3/uL (ref 0.2–1.0)
Monocytes Relative: 5 %
Neutro Abs: 9.6 10*3/uL — ABNORMAL HIGH (ref 1.4–6.5)
Neutrophils Relative %: 83 %
Platelets: 292 10*3/uL (ref 150–440)
RBC: 4.65 MIL/uL (ref 4.40–5.90)
RDW: 13.5 % (ref 11.5–14.5)
WBC: 11.4 10*3/uL — ABNORMAL HIGH (ref 3.8–10.6)

## 2016-03-04 LAB — COMPREHENSIVE METABOLIC PANEL
ALT: 131 U/L — ABNORMAL HIGH (ref 17–63)
ALT: 116 U/L — ABNORMAL HIGH (ref 17–63)
AST: 102 U/L — ABNORMAL HIGH (ref 15–41)
AST: 80 U/L — ABNORMAL HIGH (ref 15–41)
Albumin: 4.2 g/dL (ref 3.5–5.0)
Albumin: 3.9 g/dL (ref 3.5–5.0)
Alkaline Phosphatase: 223 U/L — ABNORMAL HIGH (ref 38–126)
Alkaline Phosphatase: 210 U/L — ABNORMAL HIGH (ref 38–126)
Anion gap: 18 — ABNORMAL HIGH (ref 5–15)
Anion gap: 11 (ref 5–15)
BUN: 13 mg/dL (ref 6–20)
BUN: 11 mg/dL (ref 6–20)
CO2: 18 mmol/L — ABNORMAL LOW (ref 22–32)
CO2: 22 mmol/L (ref 22–32)
Calcium: 9.6 mg/dL (ref 8.9–10.3)
Calcium: 9.1 mg/dL (ref 8.9–10.3)
Chloride: 91 mmol/L — ABNORMAL LOW (ref 101–111)
Chloride: 100 mmol/L — ABNORMAL LOW (ref 101–111)
Creatinine, Ser: 0.93 mg/dL (ref 0.61–1.24)
Creatinine, Ser: 0.78 mg/dL (ref 0.61–1.24)
GFR calc Af Amer: >60 mL/min (ref >60)
GFR calc Af Amer: >60 mL/min (ref >60)
GFR calc non Af Amer: >60 mL/min (ref >60)
GFR calc non Af Amer: >60 mL/min (ref >60)
Glucose, Bld: 527 mg/dL (ref 65–99)
Glucose, Bld: 264 mg/dL — ABNORMAL HIGH (ref 65–99)
Potassium: 4.5 mmol/L (ref 3.5–5.1)
Potassium: 3.7 mmol/L (ref 3.5–5.1)
Sodium: 127 mmol/L — ABNORMAL LOW (ref 135–145)
Sodium: 133 mmol/L — ABNORMAL LOW (ref 135–145)
Total Bilirubin: 1.7 mg/dL — ABNORMAL HIGH (ref 0.3–1.2)
Total Bilirubin: 0.6 mg/dL (ref 0.3–1.2)
Total Protein: 8.4 g/dL — ABNORMAL HIGH (ref 6.5–8.1)
Total Protein: 7.3 g/dL (ref 6.5–8.1)

## 2016-03-04 LAB — GLUCOSE, CAPILLARY
Glucose-Capillary: 549 mg/dL (ref 65–99)
Glucose-Capillary: 249 mg/dL — ABNORMAL HIGH (ref 65–99)
Glucose-Capillary: 271 mg/dL — ABNORMAL HIGH (ref 65–99)

## 2016-03-04 LAB — BLOOD GAS, VENOUS
Acid-base deficit: 1.5 mmol/L (ref 0.0–2.0)
Bicarbonate: 21.5 mEq/L (ref 21.0–28.0)
O2 Saturation: 99.4 %
Patient temperature: 37
pCO2, Ven: 31 mmHg — ABNORMAL LOW (ref 44.0–60.0)
pH, Ven: 7.45 — ABNORMAL HIGH (ref 7.320–7.430)
pO2, Ven: 152 mmHg — ABNORMAL HIGH (ref 31.0–45.0)

## 2016-03-04 LAB — LIPASE, BLOOD: Lipase: 16 U/L (ref 11–51)

## 2016-03-04 MED ORDER — CLINDAMYCIN HCL 150 MG PO CAPS: 450.0000 mg | ORAL_CAPSULE | Freq: Four times a day (QID) | ORAL | 0 refills | Status: DC

## 2016-03-04 MED ORDER — FENTANYL CITRATE (PF) 100 MCG/2ML IJ SOLN
25.0000 ug | Freq: Once | INTRAMUSCULAR | Status: AC
  Administered 2016-03-04: 25 ug via INTRAVENOUS
  Filled 2016-03-04: qty 2

## 2016-03-04 MED ORDER — SODIUM CHLORIDE 0.9 % IV BOLUS (SEPSIS)
1000.0000 mL | Freq: Once | INTRAVENOUS | Status: AC
  Administered 2016-03-04: 1000 mL via INTRAVENOUS

## 2016-03-04 MED ORDER — FENTANYL CITRATE (PF) 100 MCG/2ML IJ SOLN
50.0000 ug | Freq: Once | INTRAMUSCULAR | Status: AC
  Administered 2016-03-04: 50 ug via INTRAVENOUS

## 2016-03-04 MED ORDER — CLINDAMYCIN PHOSPHATE 900 MG/50ML IV SOLN
900.0000 mg | Freq: Once | INTRAVENOUS | Status: AC
  Administered 2016-03-04: 900 mg via INTRAVENOUS
  Filled 2016-03-04: qty 50

## 2016-03-04 MED ORDER — INSULIN ASPART 100 UNIT/ML ~~LOC~~ SOLN
10.0000 [IU] | Freq: Once | SUBCUTANEOUS | Status: AC
  Administered 2016-03-04: 10 [IU] via INTRAVENOUS
  Filled 2016-03-04: qty 10

## 2016-03-04 MED ORDER — FENTANYL CITRATE (PF) 100 MCG/2ML IJ SOLN
INTRAMUSCULAR | Status: AC
  Administered 2016-03-04: 50 ug via INTRAVENOUS
  Filled 2016-03-04: qty 2

## 2016-03-04 NOTE — Care Management Note (Signed)
Case Management Note  Patient Details  Name: Trevor Brown MRN: [id number] Date of Birth: 12/07/1990  Subjective/Objective:    [facility] RN Florentina AddisonKatie, who assisted the patient in completing it. [facility] his insulin on 02/20/16. Patient has also stated he requires a glucometer, so we will check our supply and try to assist him with one if discharged home from the ER.                Action/Plan:   Expected Discharge Date:                  Expected Discharge Plan:     In-House Referral:     Discharge planning Services     Post Acute Care Choice:    Choice offered to:     DME Arranged:    DME Agency:     HH Arranged:    HH Agency:     Status of Service:     If discussed at [employer] Length of Stay Meetings, dates discussed:    Additional Comments:  Berna BueCheryl Shevaun Lovan, RN 03/04/2016, 11:26 AM

## 2016-03-04 NOTE — ED Provider Notes (Addendum)
[facility] Emergency Department Provider Note   ____________________________________________   First MD Initiated Contact with Patient 03/04/16 [phone]     (approximate)  I have reviewed the triage vital signs and the nursing notes.   HISTORY  Chief Complaint Dental Pain and Hyperglycemia   HPI Trevor Brown is a 25 y.o. male with a history of diabetes and multiple visits for hyperglycemia and diabetic ketoacidosis was presenting to the emergency department today with dental pain as well as a "bump" on his groin as well as hyperglycemia. The patient says that he has insulin at home and has been using it but was just changed to a new insulin type. He describes his general pain is chronic but worsening and across his lower jaw. He says that he smokes but does not use any drugs. Also complaining of a bump to his groin over the past week which is painful to the left groin. Denies any dysuria.   Past Medical History:  Diagnosis Date  . Diabetes mellitus without complication (HCC)   . Heart attack (HCC)    Pt claims he had heart attack a year ago ( 2015)- and was admitted in [facility] for that, but not given any meds or angiogram, on review of chart- I could not find any details like that.  . Hepatitis C, acute may 2016    Patient Active Problem List   Diagnosis Date Noted  . 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 Anemia 01/13/2016  . Leukocytosis 01/13/2016  . Nausea & vomiting 01/10/2016  . Tobacco abuse 01/10/2016  . Hyponatremia 12/12/2015  . Type 1 diabetes mellitus with hyperglycemia (HCC) 12/12/2015  . Chest pain 12/12/2015  . Elevated troponin 12/12/2015  . Cocaine abuse 12/12/2015  . DKA (diabetic ketoacidoses) (HCC) 06/10/2015  . Hidradenitis suppurativa of left axilla   . DKA, type 1 (HCC) 06/06/2015  . Malnutrition of moderate degree (HCC) 04/08/2015  . Hepatitis C 12/13/2014  . Diabetes type 1, uncontrolled (HCC) 12/11/2014  . Major depressive disorder,  single episode, mild (HCC)   . Major depression, single episode 12/10/2014  . Diabetes mellitus type 1, uncontrolled (HCC) 12/09/2014  . Transaminitis 12/09/2014  . Folliculitis 12/09/2014  . Homelessness 12/09/2014    History reviewed. No pertinent surgical history.  Prior to Admission medications   Medication Sig Start Date End Date Taking? Authorizing Provider  insulin aspart protamine- aspart (NOVOLOG MIX 70/30) (70-30) 100 UNIT/ML injection Inject 0.24 mLs (24 Units total) into the skin 2 (two) times daily with a meal. 02/19/16  Yes Milagros Loll, MD    Allergies Bee venom; Penicillins; Ibuprofen; Tramadol; and Vancomycin  Family History  Problem Relation Age of Onset  . Cirrhosis Mother   . Diabetes Mellitus II Maternal Grandmother     Social History Social History  Substance Use Topics  . Smoking status: Current Every Day Smoker    Packs/day: 1.50    Types: Cigarettes  . Smokeless tobacco: Never Used  . Alcohol use No    Review of Systems Constitutional: No fever/chills Eyes: No visual changes. ENT: No sore throat. Cardiovascular: Denies chest pain. Respiratory: Denies shortness of breath. Gastrointestinal: No abdominal pain.  No nausea, no vomiting.  No diarrhea.  No constipation. Genitourinary: Negative for dysuria. Musculoskeletal: Negative for back pain. Skin: As above Neurological: Negative for headaches, focal weakness or numbness.  10-point ROS otherwise negative.  ____________________________________________   PHYSICAL EXAM:  VITAL SIGNS: ED Triage Vitals  Enc Vitals Group     BP 03/04/16  [id number] (!) 143/85     Pulse Rate 03/04/16 0738 87     Resp 03/04/16 0738 20     Temp 03/04/16 0738 98.4 F (36.9 C)     Temp Source 03/04/16 0738 Oral     SpO2 03/04/16 0738 99 %     Weight 03/04/16 0739 119 lb (54 kg)     Height 03/04/16 0739 5\' 4"  (1.626 m)     Head Circumference --      Peak Flow --      Pain Score 03/04/16 0739 10     Pain Loc --        Pain Edu? --      Excl. in GC? --     Constitutional: Alert and oriented. Well appearing and in no acute distress. Eyes: Conjunctivae are normal. PERRL. EOMI. Head: Atraumatic. Nose: No congestion/rhinnorhea. Mouth/Throat: Mucous membranes are moist.  Poor dentition throughout with erosion of most of the teeth down to the dentin. This is especially prominent to the teeth of the mandible. There are no loose teeth. Neck: No stridor.   Cardiovascular: Normal rate, regular rhythm. Grossly normal heart sounds.  Good peripheral circulation. Respiratory: Normal respiratory effort.  No retractions. Lungs CTAB. Gastrointestinal: Soft and nontender. No distention.  Musculoskeletal: No lower extremity tenderness nor edema.  No joint effusions. Neurologic:  Normal speech and language. No gross focal neurologic deficits are appreciated. No gait instability. Skin:  Skin is warm, dry and intact. 1 cm nodule to the left groin that is erythematous and very tender but without any fluctuance. Psychiatric: Mood and affect are normal. Speech and behavior are normal.  ____________________________________________   LABS (all labs ordered are listed, but only abnormal results are displayed)  Labs Reviewed  GLUCOSE, CAPILLARY - Abnormal; Notable for the following:       Result Value   Glucose-Capillary 549 (*)    All other components within normal limits  CBC WITH DIFFERENTIAL/PLATELET - Abnormal; Notable for the following:    WBC 11.4 (*)    Neutro Abs 9.6 (*)    All other components within normal limits  COMPREHENSIVE METABOLIC PANEL - Abnormal; Notable for the following:    Sodium 127 (*)    Chloride 91 (*)    CO2 18 (*)    Glucose, Bld 527 (*)    Total Protein 8.4 (*)    AST 102 (*)    ALT 131 (*)    Alkaline Phosphatase 223 (*)    Total Bilirubin 1.7 (*)    Anion gap 18 (*)    All other components within normal limits  URINALYSIS COMPLETEWITH MICROSCOPIC ([facility] ONLY) - Abnormal; Notable  for the following:    Color, Urine STRAW (*)    APPearance CLEAR (*)    Glucose, UA >500 (*)    Ketones, ur 1+ (*)    Specific Gravity, Urine 1.032 (*)    All other components within normal limits  BLOOD GAS, VENOUS - Abnormal; Notable for the following:    pH, Ven 7.45 (*)    pCO2, Ven 31 (*)    pO2, Ven 152.0 (*)    All other components within normal limits  GLUCOSE, CAPILLARY - Abnormal; Notable for the following:    Glucose-Capillary 249 (*)    All other components within normal limits  COMPREHENSIVE METABOLIC PANEL - Abnormal; Notable for the following:    Sodium 133 (*)    Chloride 100 (*)    Glucose, Bld 264 (*)    AST  80 (*)    ALT 116 (*)    Alkaline Phosphatase 210 (*)    All other components within normal limits  GLUCOSE, CAPILLARY - Abnormal; Notable for the following:    Glucose-Capillary 271 (*)    All other components within normal limits  LIPASE, BLOOD  CBG MONITORING, ED  CBG MONITORING, ED  CBG MONITORING, ED  CBG MONITORING, ED  CBG MONITORING, ED   ____________________________________________  EKG   ____________________________________________  RADIOLOGY   ____________________________________________   PROCEDURES  Procedure(s) performed:   Procedures  Critical Care performed:   ____________________________________________   INITIAL IMPRESSION / ASSESSMENT AND PLAN / ED COURSE  Pertinent labs & imaging results that were available during my care of the patient were reviewed by me and considered in my medical decision making (see chart for details).  Patient with folliculitis left groin and poor dentition. I feel that both of these conditions are exacerbated by the patient's poor glucose control.  Clinical Course   ----------------------------------------- 11:41 AM on 03/04/2016 -----------------------------------------  Patient without any distress at this time. He has markedly improved labs after insulin and fluids. Not appear  to be in diabetic ketoacidosis. I discussed the patient's dietary intake with him and he says he has been drinking a lot of sweet tea. This could be a possible cause of his increased blood sugar. He says he will continue to take his insulin. He was given another glucometer because he says he does not have a working glucometer at home. We discharging him with clindamycin which will cover both his dental G as well as the folliculitis to his groin. [facility], where he has received outpatient medications in the past. The patient understands his diagnosis and is willing to comply with the outpatient treatment. He will be drinking water instead of sweet tea. I will give him dental follow-up and [facility] has [facility].  ____________________________________________   FINAL CLINICAL IMPRESSION(S) / ED DIAGNOSES  Dental caries. Folliculitis. Uncontrolled diabetes.    NEW MEDICATIONS STARTED DURING THIS VISIT:  New Prescriptions   No medications on file     Note:  This document was prepared using Dragon voice recognition software and may include unintentional dictation errors.    Myrna Blazeravid Matthew Brixon Zhen, MD 03/04/16 1143  The patient requested a prescription medication for pain control for his dental caries. However, this appears to be a chronic issue and the patient is in no distress. I recommended that he take over-the-counter medications such as ibuprofen or Aleve for further relief. He is understanding and willing to comply.   Myrna Blazeravid Matthew Devell Parkerson, MD 03/04/16 1144  Given list of dental clinics also to call for follow-up within 7 days. Patient understands this plan and willing to comply.   Myrna Blazeravid Matthew Taylah Dubiel, MD 03/04/16 1149

## 2016-03-04 NOTE — Care Management Note (Signed)
Case Management Note  Patient Details  Name: Trevor Brown MRN: [id number] Date of Birth: 1990-08-19  Subjective/Objective:  Glucometer for patient use has been given to [place] , nurse for the patient so he can be discharged home.                  Action/Plan:   Expected Discharge Date:                  Expected Discharge Plan:     In-House Referral:     Discharge planning Services     Post Acute Care Choice:    Choice offered to:     DME Arranged:    DME Agency:     HH Arranged:    HH Agency:     Status of Service:     If discussed at [employer] Length of Stay Meetings, dates discussed:    Additional Comments:  Berna BueCheryl Markisha Meding, RN 03/04/2016, 11:56 AM

## 2016-03-04 NOTE — ED Triage Notes (Addendum)
Pt to ed with c/o ? Elevated blood sugar.  Pt states he has had abscess in mouth from teeth and also changed insulin type.  Reports increased lethargy. States he does not check his FSBS regularly.  FSBS at triage 549.  Hx of Hep C, MI, and IDDM

## 2016-03-08 ENCOUNTER — Emergency Department
Admission: EM | Admit: 2016-03-08 | Discharge: 2016-03-08 | Disposition: A | Discharge status: Home / Self Care | Payer: Self-pay | Attending: Emergency Medicine | Admitting: Emergency Medicine

## 2016-03-08 DIAGNOSIS — K029 Dental caries, unspecified: Secondary | ICD-10-CM | POA: Insufficient documentation

## 2016-03-08 DIAGNOSIS — Z0000 Encounter for general adult medical examination without abnormal findings: Secondary | ICD-10-CM

## 2016-03-08 DIAGNOSIS — F17210 Nicotine dependence, cigarettes, uncomplicated: Secondary | ICD-10-CM | POA: Insufficient documentation

## 2016-03-08 DIAGNOSIS — E119 Type 2 diabetes mellitus without complications: Secondary | ICD-10-CM | POA: Insufficient documentation

## 2016-03-08 DIAGNOSIS — Z794 Long term (current) use of insulin: Secondary | ICD-10-CM | POA: Insufficient documentation

## 2016-03-08 NOTE — ED Triage Notes (Signed)
Pt reports he came to the ED to be checked out. Pt reports he has no complaints at this time.

## 2016-03-08 NOTE — ED Provider Notes (Signed)
[facility]-EMERGENCY DEPARTMENT Provider Note   CSN: [id number] Arrival date & time: 03/08/16  1831     History   Chief Complaint Chief Complaint  Patient presents with  . Other    Pt states: "I just want to be checked out"    HPI Trevor Brown is a 25 y.o. male presents with his father for evaluation. Patient states him and his father need a note for the homeless shelter saying that they are not sick. Patient denies any symptoms such as cough, chest pain, shortness of breath, runny nose. He denies any rashes. He states his father was sick couple of weeks ago and he has not had any cold symptoms.  HPI  Past Medical History:  Diagnosis Date  . Diabetes mellitus without complication (HCC)   . Heart attack (HCC)    Pt claims he had heart attack a year ago ( 2015)- and was admitted in [facility] for that, but not given any meds or angiogram, on review of chart- I could not find any details like that.  . Hepatitis C, acute may 2016    Patient Active Problem List   Diagnosis Date Noted  . 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 Anemia 01/13/2016  . Leukocytosis 01/13/2016  . Nausea & vomiting 01/10/2016  . Tobacco abuse 01/10/2016  . Hyponatremia 12/12/2015  . Type 1 diabetes mellitus with hyperglycemia (HCC) 12/12/2015  . Chest pain 12/12/2015  . Elevated troponin 12/12/2015  . Cocaine abuse 12/12/2015  . DKA (diabetic ketoacidoses) (HCC) 06/10/2015  . Hidradenitis suppurativa of left axilla   . DKA, type 1 (HCC) 06/06/2015  . Malnutrition of moderate degree (HCC) 04/08/2015  . Hepatitis C 12/13/2014  . Diabetes type 1, uncontrolled (HCC) 12/11/2014  . Major depressive disorder, single episode, mild (HCC)   . Major depression, single episode 12/10/2014  . Diabetes mellitus type 1, uncontrolled (HCC) 12/09/2014  . Transaminitis 12/09/2014  . Folliculitis 12/09/2014  . Homelessness 12/09/2014    History reviewed. No pertinent surgical history.     Home Medications    Prior to  Admission medications   Medication Sig Start Date End Date Taking? Authorizing Provider  clindamycin (CLEOCIN) 150 MG capsule Take 3 capsules (450 mg total) by mouth 4 (four) times daily. 03/04/16 03/14/16  Myrna Blazeravid Matthew Schaevitz, MD  insulin aspart protamine- aspart (NOVOLOG MIX 70/30) (70-30) 100 UNIT/ML injection Inject 0.24 mLs (24 Units total) into the skin 2 (two) times daily with a meal. 02/19/16   Milagros LollSrikar Sudini, MD    Family History Family History  Problem Relation Age of Onset  . Cirrhosis Mother   . Diabetes Mellitus II Maternal Grandmother     Social History Social History  Substance Use Topics  . Smoking status: Current Every Day Smoker    Packs/day: 1.50    Types: Cigarettes  . Smokeless tobacco: Never Used  . Alcohol use No     Allergies   Bee venom; Penicillins; Ibuprofen; Tramadol; and Vancomycin   Review of Systems Review of Systems  Constitutional: Negative.  Negative for activity change, appetite change, chills and fever.  HENT: Negative for congestion, ear pain, mouth sores, rhinorrhea, sinus pressure, sore throat and trouble swallowing.   Eyes: Negative for photophobia, pain and discharge.  Respiratory: Negative for cough, chest tightness and shortness of breath.   Cardiovascular: Negative for chest pain and leg swelling.  Gastrointestinal: Negative for abdominal distention, abdominal pain, diarrhea, nausea and vomiting.  Genitourinary: Negative for difficulty urinating and dysuria.  Musculoskeletal: Negative for arthralgias, back  pain and gait problem.  Skin: Negative for color change and rash.  Neurological: Negative for dizziness and headaches.  Hematological: Negative for adenopathy.  Psychiatric/Behavioral: Negative for agitation and behavioral problems.     Physical Exam Updated Vital Signs BP 139/73 (BP Location: Right Arm)   Pulse 85   Temp 97.7 F (36.5 C) (Oral)   Resp 16   Ht 5\' 4"  (1.626 m)   Wt 54.4 kg   SpO2 94%   BMI 20.60 kg/m    Physical Exam  Constitutional: He appears well-developed and well-nourished.  HENT:  Head: Normocephalic and atraumatic.  Right Ear: External ear normal.  Left Ear: External ear normal.  Nose: Nose normal.  Mouth/Throat: Oropharynx is clear and moist.  Diffuse dental caries with no swelling warmth erythema or drainage. No signs of infection.  Eyes: Conjunctivae are normal.  Neck: Normal range of motion. Neck supple.  Cardiovascular: Normal rate and regular rhythm.   No murmur heard. Pulmonary/Chest: Effort normal and breath sounds normal. No respiratory distress.  Abdominal: Soft. There is no tenderness.  Musculoskeletal: He exhibits no edema.  Lymphadenopathy:    He has no cervical adenopathy.  Neurological: He is alert.  Skin: Skin is warm and dry. No rash noted.  Psychiatric: He has a normal mood and affect.  Nursing note and vitals reviewed.    ED Treatments / Results  Labs (all labs ordered are listed, but only abnormal results are displayed) Labs Reviewed - No data to display  EKG  EKG Interpretation None       Radiology No results found.  Procedures Procedures (including critical care time)  Medications Ordered in ED Medications - No data to display   Initial Impression / Assessment and Plan / ED Course  I have reviewed the triage vital signs and the nursing notes.  Pertinent labs & imaging results that were available during my care of the patient were reviewed by me and considered in my medical decision making (see chart for details).  Clinical Course    25 year old male to the emergency department for evaluation for homeless shelter to make sure there is no illness. Patient appears well with no signs of infection. He has diffuse dental caries. We discussed dental hygiene. Patient is to follow-up with dental clinic.  Final Clinical Impressions(s) / ED Diagnoses   Final diagnoses:  Well adult health check    New Prescriptions New Prescriptions    No medications on file     Evon Slackhomas C Donie Moulton, Cordelia Poche-C 03/08/16 1926    Sharman CheekPhillip Stafford, MD 03/08/16 [phone]

## 2016-03-08 NOTE — ED Triage Notes (Signed)
Pt states all he needs is something saying that he is ok health wise to return to homeless shelter. NAD.

## 2016-03-08 NOTE — ED Notes (Signed)
Reviewed d/c instructions, follow-up care with pt. Pt verbalized understanding 

## 2016-03-12 ENCOUNTER — Inpatient Hospital Stay
Admission: EM | Admit: 2016-03-12 | Discharge: 2016-03-16 | DRG: 638 | Disposition: A | Discharge status: Home / Self Care | Payer: Self-pay | Attending: Internal Medicine | Admitting: Internal Medicine

## 2016-03-12 ENCOUNTER — Encounter: Payer: Self-pay | Admitting: Emergency Medicine

## 2016-03-12 DIAGNOSIS — E871 Hypo-osmolality and hyponatremia: Secondary | ICD-10-CM | POA: Diagnosis present

## 2016-03-12 DIAGNOSIS — Z716 Tobacco abuse counseling: Secondary | ICD-10-CM

## 2016-03-12 DIAGNOSIS — Z880 Allergy status to penicillin: Secondary | ICD-10-CM

## 2016-03-12 DIAGNOSIS — Z833 Family history of diabetes mellitus: Secondary | ICD-10-CM

## 2016-03-12 DIAGNOSIS — E1110 Type 2 diabetes mellitus with ketoacidosis without coma: Secondary | ICD-10-CM | POA: Diagnosis present

## 2016-03-12 DIAGNOSIS — Z9119 Patient's noncompliance with other medical treatment and regimen: Secondary | ICD-10-CM

## 2016-03-12 DIAGNOSIS — Z9114 Patient's other noncompliance with medication regimen: Secondary | ICD-10-CM

## 2016-03-12 DIAGNOSIS — K047 Periapical abscess without sinus: Secondary | ICD-10-CM | POA: Diagnosis present

## 2016-03-12 DIAGNOSIS — Z794 Long term (current) use of insulin: Secondary | ICD-10-CM

## 2016-03-12 DIAGNOSIS — Z888 Allergy status to other drugs, medicaments and biological substances status: Secondary | ICD-10-CM

## 2016-03-12 DIAGNOSIS — Z79899 Other long term (current) drug therapy: Secondary | ICD-10-CM

## 2016-03-12 DIAGNOSIS — E1010 Type 1 diabetes mellitus with ketoacidosis without coma: Principal | ICD-10-CM | POA: Diagnosis present

## 2016-03-12 DIAGNOSIS — I252 Old myocardial infarction: Secondary | ICD-10-CM

## 2016-03-12 DIAGNOSIS — F17210 Nicotine dependence, cigarettes, uncomplicated: Secondary | ICD-10-CM | POA: Diagnosis present

## 2016-03-12 LAB — COMPREHENSIVE METABOLIC PANEL
ALT: 123 U/L — ABNORMAL HIGH (ref 17–63)
AST: 85 U/L — ABNORMAL HIGH (ref 15–41)
Albumin: 4.3 g/dL (ref 3.5–5.0)
Alkaline Phosphatase: 259 U/L — ABNORMAL HIGH (ref 38–126)
Anion gap: 19 — ABNORMAL HIGH (ref 5–15)
BUN: 23 mg/dL — ABNORMAL HIGH (ref 6–20)
CO2: 14 mmol/L — ABNORMAL LOW (ref 22–32)
Calcium: 8.8 mg/dL — ABNORMAL LOW (ref 8.9–10.3)
Chloride: 92 mmol/L — ABNORMAL LOW (ref 101–111)
Creatinine, Ser: 1.1 mg/dL (ref 0.61–1.24)
GFR calc Af Amer: >60 mL/min (ref >60)
GFR calc non Af Amer: >60 mL/min (ref >60)
Glucose, Bld: 784 mg/dL (ref 65–99)
Potassium: 4.9 mmol/L (ref 3.5–5.1)
Sodium: 125 mmol/L — ABNORMAL LOW (ref 135–145)
Total Bilirubin: 1.4 mg/dL — ABNORMAL HIGH (ref 0.3–1.2)
Total Protein: 8.3 g/dL — ABNORMAL HIGH (ref 6.5–8.1)

## 2016-03-12 LAB — GLUCOSE, CAPILLARY
Glucose-Capillary: >600 mg/dL (ref 65–99)
Glucose-Capillary: >600 mg/dL (ref 65–99)
Glucose-Capillary: 450 mg/dL — ABNORMAL HIGH (ref 65–99)
Glucose-Capillary: 412 mg/dL — ABNORMAL HIGH (ref 65–99)
Glucose-Capillary: 351 mg/dL — ABNORMAL HIGH (ref 65–99)
Glucose-Capillary: 268 mg/dL — ABNORMAL HIGH (ref 65–99)
Glucose-Capillary: 261 mg/dL — ABNORMAL HIGH (ref 65–99)
Glucose-Capillary: 346 mg/dL — ABNORMAL HIGH (ref 65–99)
Glucose-Capillary: 329 mg/dL — ABNORMAL HIGH (ref 65–99)
Glucose-Capillary: 300 mg/dL — ABNORMAL HIGH (ref 65–99)
Glucose-Capillary: 389 mg/dL — ABNORMAL HIGH (ref 65–99)

## 2016-03-12 LAB — CBC WITH DIFFERENTIAL/PLATELET
Basophils Absolute: 0.1 10*3/uL (ref 0–0.1)
Basophils Relative: 1 %
Eosinophils Absolute: 0 10*3/uL (ref 0–0.7)
Eosinophils Relative: 0 %
HCT: 39.9 % — ABNORMAL LOW (ref 40.0–52.0)
Hemoglobin: 13.1 g/dL (ref 13.0–18.0)
Lymphocytes Relative: 9 %
Lymphs Abs: 1 10*3/uL (ref 1.0–3.6)
MCH: 30.2 pg (ref 26.0–34.0)
MCHC: 32.9 g/dL (ref 32.0–36.0)
MCV: 91.6 fL (ref 80.0–100.0)
Monocytes Absolute: 0.5 10*3/uL (ref 0.2–1.0)
Monocytes Relative: 5 %
Neutro Abs: 9.5 10*3/uL — ABNORMAL HIGH (ref 1.4–6.5)
Neutrophils Relative %: 85 %
Platelets: 294 10*3/uL (ref 150–440)
RBC: 4.36 MIL/uL — ABNORMAL LOW (ref 4.40–5.90)
RDW: 14.2 % (ref 11.5–14.5)
WBC: 11.2 10*3/uL — ABNORMAL HIGH (ref 3.8–10.6)

## 2016-03-12 LAB — BASIC METABOLIC PANEL
Anion gap: 12 (ref 5–15)
Anion gap: 7 (ref 5–15)
Anion gap: 9 (ref 5–15)
BUN: 17 mg/dL (ref 6–20)
BUN: 14 mg/dL (ref 6–20)
BUN: 16 mg/dL (ref 6–20)
CO2: 20 mmol/L — ABNORMAL LOW (ref 22–32)
CO2: 22 mmol/L (ref 22–32)
CO2: 21 mmol/L — ABNORMAL LOW (ref 22–32)
Calcium: 9.2 mg/dL (ref 8.9–10.3)
Calcium: 8.7 mg/dL — ABNORMAL LOW (ref 8.9–10.3)
Calcium: 8.9 mg/dL (ref 8.9–10.3)
Chloride: 99 mmol/L — ABNORMAL LOW (ref 101–111)
Chloride: 102 mmol/L (ref 101–111)
Chloride: 100 mmol/L — ABNORMAL LOW (ref 101–111)
Creatinine, Ser: 0.8 mg/dL (ref 0.61–1.24)
Creatinine, Ser: 0.54 mg/dL — ABNORMAL LOW (ref 0.61–1.24)
Creatinine, Ser: 0.69 mg/dL (ref 0.61–1.24)
GFR calc Af Amer: >60 mL/min (ref >60)
GFR calc Af Amer: >60 mL/min (ref >60)
GFR calc Af Amer: >60 mL/min (ref >60)
GFR calc non Af Amer: >60 mL/min (ref >60)
GFR calc non Af Amer: >60 mL/min (ref >60)
GFR calc non Af Amer: >60 mL/min (ref >60)
Glucose, Bld: 249 mg/dL — ABNORMAL HIGH (ref 65–99)
Glucose, Bld: 322 mg/dL — ABNORMAL HIGH (ref 65–99)
Glucose, Bld: 353 mg/dL — ABNORMAL HIGH (ref 65–99)
Potassium: 4.4 mmol/L (ref 3.5–5.1)
Potassium: 3.2 mmol/L — ABNORMAL LOW (ref 3.5–5.1)
Potassium: 3.2 mmol/L — ABNORMAL LOW (ref 3.5–5.1)
Sodium: 131 mmol/L — ABNORMAL LOW (ref 135–145)
Sodium: 131 mmol/L — ABNORMAL LOW (ref 135–145)
Sodium: 130 mmol/L — ABNORMAL LOW (ref 135–145)

## 2016-03-12 LAB — URINALYSIS COMPLETE WITH MICROSCOPIC (ARMC ONLY)
Bacteria, UA: NONE SEEN
Bilirubin Urine: NEGATIVE
Glucose, UA: >500 mg/dL — AB
Hgb urine dipstick: NEGATIVE
Leukocytes, UA: NEGATIVE
Nitrite: NEGATIVE
Protein, ur: NEGATIVE mg/dL
RBC / HPF: NONE SEEN RBC/hpf (ref 0–5)
Specific Gravity, Urine: 1.022 (ref 1.005–1.030)
Squamous Epithelial / LPF: NONE SEEN
WBC, UA: NONE SEEN WBC/hpf (ref 0–5)
pH: 5 (ref 5.0–8.0)

## 2016-03-12 LAB — MRSA PCR SCREENING: MRSA by PCR: NEGATIVE

## 2016-03-12 MED ORDER — ACETAMINOPHEN 650 MG RE SUPP: 650.0000 mg | Freq: Four times a day (QID) | RECTAL | Status: DC | PRN

## 2016-03-12 MED ORDER — INSULIN ASPART 100 UNIT/ML ~~LOC~~ SOLN
0.0000 [IU] | Freq: Three times a day (TID) | SUBCUTANEOUS | Status: DC
  Administered 2016-03-12: 7 [IU] via SUBCUTANEOUS
  Administered 2016-03-12 – 2016-03-13 (×2): 9 [IU] via SUBCUTANEOUS
  Filled 2016-03-12: qty 10
  Filled 2016-03-12 (×2): qty 9
  Filled 2016-03-12: qty 7

## 2016-03-12 MED ORDER — SODIUM CHLORIDE 0.9 % IV SOLN
Freq: Once | INTRAVENOUS | Status: AC
  Administered 2016-03-12: 08:00:00 via INTRAVENOUS

## 2016-03-12 MED ORDER — SODIUM CHLORIDE 0.9 % IV SOLN
INTRAVENOUS | Status: DC
  Administered 2016-03-12: 3.9 [IU]/h via INTRAVENOUS
  Filled 2016-03-12: qty 2.5

## 2016-03-12 MED ORDER — INSULIN ASPART PROT & ASPART (70-30 MIX) 100 UNIT/ML ~~LOC~~ SUSP
24.0000 [IU] | Freq: Two times a day (BID) | SUBCUTANEOUS | Status: DC
  Administered 2016-03-12 – 2016-03-13 (×2): 24 [IU] via SUBCUTANEOUS
  Filled 2016-03-12: qty 24
  Filled 2016-03-12 (×2): qty 0.24
  Filled 2016-03-12: qty 24

## 2016-03-12 MED ORDER — ACETAMINOPHEN 325 MG PO TABS
650.0000 mg | ORAL_TABLET | Freq: Four times a day (QID) | ORAL | Status: DC | PRN
  Administered 2016-03-12 – 2016-03-13 (×3): 650 mg via ORAL
  Filled 2016-03-12 (×3): qty 2

## 2016-03-12 MED ORDER — GABAPENTIN 300 MG PO CAPS
300.0000 mg | ORAL_CAPSULE | Freq: Once | ORAL | Status: AC
  Administered 2016-03-12: 300 mg via ORAL
  Filled 2016-03-12: qty 1

## 2016-03-12 MED ORDER — ONDANSETRON HCL 4 MG/2ML IJ SOLN
4.0000 mg | Freq: Four times a day (QID) | INTRAMUSCULAR | Status: DC | PRN
  Administered 2016-03-14 – 2016-03-15 (×3): 4 mg via INTRAVENOUS
  Filled 2016-03-12 (×3): qty 2

## 2016-03-12 MED ORDER — SODIUM CHLORIDE 0.9 % IV SOLN
INTRAVENOUS | Status: DC
  Administered 2016-03-12: 14:00:00 via INTRAVENOUS

## 2016-03-12 MED ORDER — ENOXAPARIN SODIUM 40 MG/0.4ML ~~LOC~~ SOLN
40.0000 mg | SUBCUTANEOUS | Status: DC
  Administered 2016-03-12 – 2016-03-15 (×4): 40 mg via SUBCUTANEOUS
  Filled 2016-03-12 (×4): qty 0.4

## 2016-03-12 MED ORDER — INSULIN ASPART 100 UNIT/ML ~~LOC~~ SOLN
10.0000 [IU] | Freq: Once | SUBCUTANEOUS | Status: AC
  Administered 2016-03-12: 10 [IU] via INTRAVENOUS
  Filled 2016-03-12: qty 10

## 2016-03-12 MED ORDER — ONDANSETRON HCL 4 MG PO TABS
4.0000 mg | ORAL_TABLET | Freq: Four times a day (QID) | ORAL | Status: DC | PRN
  Administered 2016-03-14: 4 mg via ORAL
  Filled 2016-03-12: qty 1
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
## 2016-03-12 NOTE — ED Notes (Signed)
Waiting on admission bed

## 2016-03-12 NOTE — Progress Notes (Signed)
Notified Dr. Allena KatzPatel of BMP. Per Dr. Eliane DecreePatel's orders d/c insulin gtt, start patient on home dose of 70/30 along with sensitive sliding scale. Also received order to transfer patient to any medsurg. Orders placed.  Trudee KusterBrandi R Mansfield
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
## 2016-03-12 NOTE — Care Management (Signed)
Message left for [employer] [phone]) [phone] Sabrina Corbit- case mgr to check status of patient's residence. Patient's contact number is no longer working (like previous admission). If [facility] can get notification that patient is staying at homeless shelter- they will be able to follow this patient for medical care (if patient complies).   There may be some behavioral issues related to diagnosis causing patient not to follow through. I don't believe (per my many conversations in the past with this patient) that patient declines treatment regimens however there's something missing and I cannot determine what that is. I have requested psych evaluation. Patient has been given many applications to FREE drug coverage and medical care through Medication management and [facility] but not following through- but he ends up back in ICU.

## 2016-03-12 NOTE — Progress Notes (Signed)
Spoke with Dr. Elisabeth PigeonVachhani (on call physician) about patient's lower ext pain. Order received for 300 mg gabapentin one time. Order placed.  Trudee KusterBrandi R Mansfield

## 2016-03-12 NOTE — Progress Notes (Signed)
Met with patient to discuss recent loss of insulin and re-admit for DKA.  Father lives at the shelter with him.  Tells me that he could have given his valuables (meter and insulin) to be kept in safe keeping but "I just didn't" .  Noted that case manager is working with this patient.  Spoke to RN caring for this patient.  Per protocol, insulin drip should have been kept on until 1 hour after 70/30 insulin was administered.   Gentry Fitz, RN, BA, MHA, CDE Diabetes Coordinator Inpatient Diabetes Program  [phone] (Team Pager) [phone] ([place]) 03/12/2016 3:37 PM
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
## 2016-03-12 NOTE — Progress Notes (Signed)
Inpatient Diabetes Program Recommendations  AACE/ADA: New Consensus Statement on Inpatient Glycemic Control (2015)  Target Ranges:  Prepandial:   less than 140 mg/dL      Peak postprandial:   less than 180 mg/dL (1-2 hours)      Critically ill patients:  140 - 180 mg/dL   Lab Results  Component Value Date   GLUCAP 351 (H) 03/12/2016   HGBA1C 13.4 (H) 02/19/2016    Diabetes history: Type 1 Outpatient Diabetes medications: Novolog 70/30 24 units bid- not recently taken  Inpatient Diabetes Program Recommendations:   IV insulin infusion with sufficient glucose should be continued until acidosis is corrected as determined by MD Venous CO2=20, normal anion gap (8-12), negative ketones  While on IV insulin, if patient is eating cover carbohydrate (CHO) intake using IV insulin/CHO coverage via Glucostabilizer (1 unit for every 10 grams of CHO)  Transition to SQ insulin-MD will initiate Phase 2 of DKA Protocol for transition off insulin drip.  RN discontinue insulin drip 2 hours after subcutaneous basal insulin given and simultaneously give Novolog correction scale.  Susette RacerJulie Tkeya Stencil, RN, BA, MHA, CDE Diabetes Coordinator Inpatient Diabetes Program  [phone] (Team Pager) [phone] [facility] Office) 03/12/2016 12:14 PM

## 2016-03-12 NOTE — Progress Notes (Signed)
Patient complaining of generalized pain. Ordered from Dr. Allena KatzPatel to continue tylenol only at this time.  Trevor KusterBrandi R Mansfield

## 2016-03-13 LAB — BASIC METABOLIC PANEL
Anion gap: 12 (ref 5–15)
Anion gap: 11 (ref 5–15)
Anion gap: 13 (ref 5–15)
Anion gap: 9 (ref 5–15)
Anion gap: 10 (ref 5–15)
Anion gap: 10 (ref 5–15)
BUN: 15 mg/dL (ref 6–20)
BUN: 14 mg/dL (ref 6–20)
BUN: 14 mg/dL (ref 6–20)
BUN: 20 mg/dL (ref 6–20)
BUN: 19 mg/dL (ref 6–20)
BUN: 20 mg/dL (ref 6–20)
CO2: 20 mmol/L — ABNORMAL LOW (ref 22–32)
CO2: 20 mmol/L — ABNORMAL LOW (ref 22–32)
CO2: 19 mmol/L — ABNORMAL LOW (ref 22–32)
CO2: 23 mmol/L (ref 22–32)
CO2: 22 mmol/L (ref 22–32)
CO2: 22 mmol/L (ref 22–32)
Calcium: 8.9 mg/dL (ref 8.9–10.3)
Calcium: 8.7 mg/dL — ABNORMAL LOW (ref 8.9–10.3)
Calcium: 9 mg/dL (ref 8.9–10.3)
Calcium: 9.1 mg/dL (ref 8.9–10.3)
Calcium: 9.3 mg/dL (ref 8.9–10.3)
Calcium: 9.1 mg/dL (ref 8.9–10.3)
Chloride: 101 mmol/L (ref 101–111)
Chloride: 98 mmol/L — ABNORMAL LOW (ref 101–111)
Chloride: 96 mmol/L — ABNORMAL LOW (ref 101–111)
Chloride: 100 mmol/L — ABNORMAL LOW (ref 101–111)
Chloride: 99 mmol/L — ABNORMAL LOW (ref 101–111)
Chloride: 101 mmol/L (ref 101–111)
Creatinine, Ser: 0.69 mg/dL (ref 0.61–1.24)
Creatinine, Ser: 0.68 mg/dL (ref 0.61–1.24)
Creatinine, Ser: 0.95 mg/dL (ref 0.61–1.24)
Creatinine, Ser: 0.68 mg/dL (ref 0.61–1.24)
Creatinine, Ser: 0.72 mg/dL (ref 0.61–1.24)
Creatinine, Ser: 0.65 mg/dL (ref 0.61–1.24)
GFR calc Af Amer: >60 mL/min (ref >60)
GFR calc Af Amer: >60 mL/min (ref >60)
GFR calc Af Amer: >60 mL/min (ref >60)
GFR calc Af Amer: >60 mL/min (ref >60)
GFR calc Af Amer: >60 mL/min (ref >60)
GFR calc Af Amer: >60 mL/min (ref >60)
GFR calc non Af Amer: >60 mL/min (ref >60)
GFR calc non Af Amer: >60 mL/min (ref >60)
GFR calc non Af Amer: >60 mL/min (ref >60)
GFR calc non Af Amer: >60 mL/min (ref >60)
GFR calc non Af Amer: >60 mL/min (ref >60)
GFR calc non Af Amer: >60 mL/min (ref >60)
Glucose, Bld: 393 mg/dL — ABNORMAL HIGH (ref 65–99)
Glucose, Bld: 462 mg/dL — ABNORMAL HIGH (ref 65–99)
Glucose, Bld: 510 mg/dL (ref 65–99)
Glucose, Bld: 312 mg/dL — ABNORMAL HIGH (ref 65–99)
Glucose, Bld: 283 mg/dL — ABNORMAL HIGH (ref 65–99)
Glucose, Bld: 298 mg/dL — ABNORMAL HIGH (ref 65–99)
Potassium: 4 mmol/L (ref 3.5–5.1)
Potassium: 4.3 mmol/L (ref 3.5–5.1)
Potassium: 4.1 mmol/L (ref 3.5–5.1)
Potassium: 3.7 mmol/L (ref 3.5–5.1)
Potassium: 3.2 mmol/L — ABNORMAL LOW (ref 3.5–5.1)
Potassium: 3.7 mmol/L (ref 3.5–5.1)
Sodium: 133 mmol/L — ABNORMAL LOW (ref 135–145)
Sodium: 129 mmol/L — ABNORMAL LOW (ref 135–145)
Sodium: 128 mmol/L — ABNORMAL LOW (ref 135–145)
Sodium: 132 mmol/L — ABNORMAL LOW (ref 135–145)
Sodium: 131 mmol/L — ABNORMAL LOW (ref 135–145)
Sodium: 133 mmol/L — ABNORMAL LOW (ref 135–145)

## 2016-03-13 LAB — GLUCOSE, CAPILLARY
Glucose-Capillary: 447 mg/dL — ABNORMAL HIGH (ref 65–99)
Glucose-Capillary: 426 mg/dL — ABNORMAL HIGH (ref 65–99)
Glucose-Capillary: 314 mg/dL — ABNORMAL HIGH (ref 65–99)
Glucose-Capillary: 481 mg/dL — ABNORMAL HIGH (ref 65–99)
Glucose-Capillary: 313 mg/dL — ABNORMAL HIGH (ref 65–99)
Glucose-Capillary: 320 mg/dL — ABNORMAL HIGH (ref 65–99)

## 2016-03-13 MED ORDER — NICOTINE 21 MG/24HR TD PT24
21.0000 mg | MEDICATED_PATCH | Freq: Every day | TRANSDERMAL | Status: DC
  Administered 2016-03-13 – 2016-03-16 (×4): 21 mg via TRANSDERMAL
  Filled 2016-03-13 (×4): qty 1

## 2016-03-13 MED ORDER — CLINDAMYCIN HCL 150 MG PO CAPS
300.0000 mg | ORAL_CAPSULE | Freq: Three times a day (TID) | ORAL | Status: DC
  Administered 2016-03-13 – 2016-03-16 (×10): 300 mg via ORAL
  Filled 2016-03-13 (×11): qty 2

## 2016-03-13 MED ORDER — OXYCODONE-ACETAMINOPHEN 5-325 MG PO TABS
1.0000 | ORAL_TABLET | Freq: Two times a day (BID) | ORAL | Status: DC | PRN
  Administered 2016-03-13 – 2016-03-14 (×3): 1 via ORAL
  Filled 2016-03-13 (×3): qty 1

## 2016-03-13 MED ORDER — INSULIN ASPART 100 UNIT/ML ~~LOC~~ SOLN
0.0000 [IU] | Freq: Three times a day (TID) | SUBCUTANEOUS | Status: DC
  Administered 2016-03-13 (×2): 20 [IU] via SUBCUTANEOUS
  Filled 2016-03-13 (×2): qty 20

## 2016-03-13 MED ORDER — ACETAMINOPHEN 325 MG PO TABS
650.0000 mg | ORAL_TABLET | Freq: Four times a day (QID) | ORAL | Status: DC | PRN
  Administered 2016-03-14 – 2016-03-15 (×2): 650 mg via ORAL
  Filled 2016-03-13 (×2): qty 2

## 2016-03-13 MED ORDER — ACETAMINOPHEN 650 MG RE SUPP: 650.0000 mg | Freq: Four times a day (QID) | RECTAL | Status: DC | PRN

## 2016-03-13 MED ORDER — INSULIN ASPART 100 UNIT/ML ~~LOC~~ SOLN
0.0000 [IU] | Freq: Three times a day (TID) | SUBCUTANEOUS | Status: DC
  Administered 2016-03-13: 15 [IU] via SUBCUTANEOUS
  Administered 2016-03-14: 20 [IU] via SUBCUTANEOUS
  Filled 2016-03-13: qty 20
  Filled 2016-03-13: qty 15

## 2016-03-13 MED ORDER — INSULIN ASPART 100 UNIT/ML ~~LOC~~ SOLN
10.0000 [IU] | Freq: Once | SUBCUTANEOUS | Status: AC
  Administered 2016-03-13: 10 [IU] via SUBCUTANEOUS

## 2016-03-13 MED ORDER — OXYCODONE-ACETAMINOPHEN 5-325 MG PO TABS
1.0000 | ORAL_TABLET | ORAL | Status: DC | PRN
  Administered 2016-03-13: 1 via ORAL
  Filled 2016-03-13: qty 1

## 2016-03-13 MED ORDER — INSULIN ASPART PROT & ASPART (70-30 MIX) 100 UNIT/ML ~~LOC~~ SUSP
30.0000 [IU] | Freq: Two times a day (BID) | SUBCUTANEOUS | Status: DC
  Administered 2016-03-13 – 2016-03-14 (×2): 30 [IU] via SUBCUTANEOUS
  Filled 2016-03-13: qty 20
  Filled 2016-03-13: qty 30
  Filled 2016-03-13: qty 10

## 2016-03-13 NOTE — Progress Notes (Signed)
Meal tray given 

## 2016-03-13 NOTE — Progress Notes (Signed)
MD paged patients [phone]  no ordered coverage..DR.Willis notified  placing new orders.

## 2016-03-13 NOTE — Progress Notes (Addendum)
Inpatient Diabetes Program Recommendations  AACE/ADA: New Consensus Statement on Inpatient Glycemic Control (2015)  Target Ranges:  Prepandial:   less than 140 mg/dL      Peak postprandial:   less than 180 mg/dL (1-2 hours)      Critically ill patients:  140 - 180 mg/dL  Results for Trevor Brown, Trevor Brown (MRN [id number]) as of 03/13/2016 12:22  Ref. Range 03/13/2016 07:17 03/13/2016 10:50  Glucose-Capillary Latest Ref Range: 65 - 99 mg/dL 086447 (H) 578426 (H)   Results for Trevor Brown, Trevor Brown (MRN [id number]) as of 03/13/2016 09:43  Ref. Range 03/12/2016 12:07 03/12/2016 13:08 03/12/2016 14:02 03/12/2016 15:35 03/12/2016 16:49 03/12/2016 17:57 03/12/2016 21:19 03/13/2016 07:17  Glucose-Capillary Latest Ref Range: 65 - 99 mg/dL 413351 (H) 244268 (H) 010261 (H) 346 (H)  70/30 24 units @ 15:48  Novolog 7 units 329 (H) 300 (H) 389 (H)  Novolog 9 units 447 (H)  Novolog 9 units  70/30 24 units @ 8:30    Review of Glycemic Control  Diabetes history: DM1 Outpatient Diabetes medications: 70/30 24 units BID Current orders for Inpatient glycemic control: 70/30 24 units BID, Novolog 0-20 units TID with meals  Inpatient Diabetes Program Recommendations: IV insulin: Glucose 447 mg/dl at 2:72ZD7:17am today and patient received Novolog 9 units for correction and 70/30 24 units at 8:30 am. Glucose 426 mg/dl at 66:4410:50 am today and patient received Novolog 10 units at 10:04 am and Novolog 20 units at 11:00. Patient has received a total of Novolog 30 units within one hour. It appears that glucose is not trending down with SQ insulin. CO2 19 and AG 13 at 9:17 am indicating mild DKA. Recommend rechecking glucose is 1 hour and if glucose continues to be over 350 mg/dl, please consider reordering DKA order set and transfer patient to ICU for IV insulin per GlucoStabilizer.  NOTE: In reviewing the chart, note that patient was taken off IV insulin when glucose was 346 mg/dl on 0/34/748/22/17. Question if acidosis was cleared prior to being transitioned from IV  to SQ insulin.   Thanks, Orlando PennerMarie Lattie Cervi, RN, MSN, CDE Diabetes Coordinator Inpatient Diabetes Program [phone] (Team Pager from 8am to 5pm) [phone] ([facility] office) [phone] [facility] office) [phone] [facility] office)

## 2016-03-13 NOTE — Progress Notes (Signed)
Upon arrival to room smell of cigarette smoke present. Patient denies smoking but ashes noted in bathroom. Patient refuses to give nursing staff cigarettes and lighter. Patient states" I do not have any more".  Education given .AC Aleasha notified.

## 2016-03-13 NOTE — Progress Notes (Signed)
Patient ambulated around unit tolerated well no acute distress noted.

## 2016-03-13 NOTE — Clinical Social Work Note (Signed)
Clinical Social Work Assessment  Patient Details  Name: Trevor Brown MRN: [id number] Date of Birth: Aug 15, 1990  Date of referral:  03/13/16               Reason for consult:  Housing Concerns/Homelessness                Permission sought to share information with:    Permission granted to share information::     Name::        Agency::     Relationship::     Contact Information:     Housing/Transportation Living arrangements for the past 2 months:  No permanent address, Homeless Shelter, Homeless Source of Information:  Patient Patient Interpreter Needed:  None Criminal Activity/Legal Involvement Pertinent to Current Situation/Hospitalization:  No - Comment as needed Significant Relationships:  Parents Lives with:  Parents Do you feel safe going back to the place where you live?  Yes Need for family participation in patient care:  No (Coment)  Care giving concerns:  Patient is currently staying at the [facility] in [place] with his father [name].    Social Worker assessment / plan:  Holiday representative ([place]) received consult for homeless concerns. CSW met with patient alone at bedside. Patient was laying in the bed and had the covers pulled up to his neck. Patient was alert and oriented. Patient did not make eye contact with CSW and had a flat affect. CSW introduced self and explained role of CSW department. Patient reported that he has been staying at [facility] for 2 days with his father [name]. Patient reported that before he was staying at the shelter he was sleeping outside for 3 days with his father as well. Patient reported that before he was sleeping outside he was staying on couches of different friends and family. Patient reported that he does not have a job and has no income. Per patient his father does work. Per patient neither him or his father have a car or means of transportation. Patient reported that his plan is to return to  the homeless shelter when he leaves the hospital and he stated he will need a ride back to the shelter. CSW provided patient with a list of [place] resources including financial and housing resources. Patient reported no needs or concerns at this time. CSW attempted to call [facility] however nobody answered and a voicemail was left. CSW will continue to follow and assist as needed.   Employment status:  Unemployed Forensic scientist:  Self Pay (Medicaid Pending) PT Recommendations:  Not assessed at this time Information / Referral to community resources:  Shelter, Other (Comment Required) ([place]. )  Patient/Family's Response to care:  Patient stated that he will return to the homeless shelter.   Patient/Family's Understanding of and Emotional Response to Diagnosis, Current Treatment, and Prognosis:  Patient's affect was flat and he reported no concerns or needs at this time.   Emotional Assessment Appearance:  Appears younger than stated age Attitude/Demeanor/Rapport:  Guarded, Lethargic Affect (typically observed):  Flat, Guarded, Quiet Orientation:  Oriented to Self, Oriented to Place, Oriented to  Time, Oriented to Situation Alcohol / Substance use:  Not Applicable Psych involvement (Current and /or in the community):  No (Comment)  Discharge Needs  Concerns to be addressed:  Discharge Planning Concerns, Denies Needs/Concerns at this time, Medication Concerns, Financial / Insurance Concerns, Homelessness Readmission within the last 30 days:  No Current discharge risk:  Homeless Barriers to Discharge:  Continued Medical Work up   [employer], Friedrich Harriott, LCSW 03/13/2016, 2:01 PM

## 2016-03-13 NOTE — Progress Notes (Signed)
Patient c/o pain to right side of face. RN noticed some swelling to that side of a face. Patient has bad dental care noted. Notified MD.

## 2016-03-13 NOTE — Progress Notes (Signed)
Lab- blood glucose was 462, sent CNA in room to to blood sugar. Results 447, Notified MD. This RN gave SS Novolog insulin of 9 units plus 70/30 insulin, 24 units.

## 2016-03-13 NOTE — Progress Notes (Deleted)
Lab blood glucose level was 462 this am, CNA obtained finger stick this am was 447, notified MD. No new orders at this time.

## 2016-03-13 NOTE — Care Management (Signed)
Received callback from Eino FarberSabrina Corbit [phone] at [facility] stating that patient "is a resident at the homeless shelter. She will send Rosanna RandyLorie Holt at [facility] proof that he is staying there so that he can establish care with them. RNCM to give patient "another" application to [facility] prior to discharge and instruct patient that someone will be in contact with [facility] for appointment.

## 2016-03-13 NOTE — Progress Notes (Signed)
[facility] - [place] at [facility]   PATIENT NAME: Trevor Brown    MR#:  [id number]  DATE OF BIRTH:  22-May-1991  SUBJECTIVE:  CHIEF COMPLAINT:   Chief Complaint  Patient presents with  . Hyperglycemia  sugar 447 this morning, wanting more food and milk, agreeable to cut back on narcotics, although not very happy about it, wanting his narcotics , although not able to confirm that he is getting any as an outpt, requests nicotine patch REVIEW OF SYSTEMS:  Review of Systems  Constitutional: Negative for chills, fever and weight loss.  HENT: Negative for nosebleeds and sore throat.   Eyes: Negative for blurred vision.  Respiratory: Negative for cough, shortness of breath and wheezing.   Cardiovascular: Negative for chest pain, orthopnea, leg swelling and PND.  Gastrointestinal: Negative for abdominal pain, constipation, diarrhea, heartburn, nausea and vomiting.  Genitourinary: Negative for dysuria and urgency.  Musculoskeletal: Positive for back pain.  Skin: Negative for rash.  Neurological: Negative for dizziness, speech change, focal weakness and headaches.  Endo/Heme/Allergies: Does not bruise/bleed easily.  Psychiatric/Behavioral: Negative for depression.   DRUG ALLERGIES:   Allergies  Allergen Reactions  . Bee Venom Anaphylaxis  . Penicillins Anaphylaxis, Hives and Other (See Comments)    Has patient had a PCN reaction causing immediate rash, facial/tongue/throat swelling, SOB or lightheadedness with hypotension: Yes Has patient had a PCN reaction causing severe rash involving mucus membranes or skin necrosis: No Has patient had a PCN reaction that required hospitalization No Has patient had a PCN reaction occurring within the last 10 years: Yes If all of the above answers are "NO", then may proceed with Cephalosporin use.  . Ibuprofen Other (See Comments)    Reaction:  GI upset   . Tramadol Hives and Swelling  . Vancomycin Rash and Other (See Comments)      Reaction:  Red man's syndrome    VITALS:  Blood pressure (!) 145/82, pulse 79, temperature 99.2 F (37.3 C), temperature source Oral, resp. rate 16, height 5\' 4"  (1.626 m), weight 54.3 kg (119 lb 11.4 oz), SpO2 100 %. PHYSICAL EXAMINATION:  Physical Exam  Constitutional: He is oriented to person, place, and time and well-developed, well-nourished, and in no distress.  HENT:  Head: Normocephalic and atraumatic.  Eyes: Conjunctivae and EOM are normal. Pupils are equal, round, and reactive to light.  Neck: Normal range of motion. Neck supple. No tracheal deviation present. No thyromegaly present.  Cardiovascular: Normal rate, regular rhythm and normal heart sounds.   Pulmonary/Chest: Effort normal and breath sounds normal. No respiratory distress. He has no wheezes. He exhibits no tenderness.  Abdominal: Soft. Bowel sounds are normal. He exhibits no distension. There is no tenderness.  Musculoskeletal: Normal range of motion.  Neurological: He is alert and oriented to person, place, and time. No cranial nerve deficit.  Skin: Skin is warm and dry. No rash noted.  Psychiatric: Mood and affect normal.   LABORATORY PANEL:   CBC  Recent Labs Lab 03/12/16 0815  WBC 11.2*  HGB 13.1  HCT 39.9*  PLT 294   ------------------------------------------------------------------------------------------------------------------ Chemistries   Recent Labs Lab 03/12/16 0815  03/13/16 0531  NA 125*  < > 129*  K 4.9  < > 4.3  CL 92*  < > 98*  CO2 14*  < > 20*  GLUCOSE 784*  < > 462*  BUN 23*  < > 14  CREATININE 1.10  < > 0.68  CALCIUM 8.8*  < > 8.7*  AST 85*  --   --   ALT 123*  --   --   ALKPHOS 259*  --   --   BILITOT 1.4*  --   --   < > = values in this interval not displayed. RADIOLOGY:  No results found. ASSESSMENT AND PLAN:  Trevor Brown  is a 25 y.o. male with a known history of Type 1 diabetes insulin requiring who has been noncompliant with his insulin secondary to financial  constraints and living in the shelter home was just seen in the emergency room a few days ago was set up with outpatient follow-up and was also given a glucometer to check his sugars however patient stays with his insulin and his glucometer got stolen and had nowhere to go  1. Acute diabetic ketoacidosis in the setting of type 1 diabetes with noncompliance to insulin secondary to financial constraints and living in the shelter home - Switched over to subcutaneous insulin 70/30 24 units twice a day - Tolerating diet - sugar 462 this morning  2. Generalized body ache -When necessary Tylenol - I will cut back on percocet to Q 12 prn instead of Q 4 hrs (I do not see any obvious indication for same) - Strong suspicion for narcotic seeking  3. Type 1 diabetes noncompliance secondary to financial constraints and living in a homeless shelter -Care management for discharge planning  4.  Hyponatremia - Improving with IV hydration - Sodium went up from 125 -> 129  5. Tobacco abuse advised smoking cessation 3 minutes spent. ordered nicotine patch   Discontinue telemetry  All the records are reviewed and case discussed with Care Management/Social Worker. Management plans discussed with the patient, nursing and they are in agreement.  CODE STATUS: Full code  TOTAL TIME TAKING CARE OF THIS PATIENT: 35 minutes.   More than 50% of the time was spent in counseling/coordination of care: YES  POSSIBLE D/C IN 1-2 DAYS, DEPENDING ON CLINICAL CONDITION.  And better blood sugar control   [facility], Charmion Hapke M.D on 03/13/2016 at 8:28 AM  Between 7am to 6pm - Pager - [phone]  After 6pm go to [email or web address] - Scientist, research (life sciences)password EPAS [facility]  [facility] [place] [facility]  Office  [phone]  CC: Primary care physician; No PCP Per Patient  Note: This dictation was prepared with Dragon dictation along with smaller phrase technology. Any transcriptional errors that result from this process are  unintentional.

## 2016-03-13 NOTE — Progress Notes (Addendum)
Lab draw at 917, results blood sugar was 510. Notified MD. New orders to give an additional 10 units of Novolog and re-check sugar in 30 minutes. Order placed.  Repeat blood sugar 426, MD notified. No new orders at this time

## 2016-03-14 LAB — GLUCOSE, CAPILLARY
Glucose-Capillary: 271 mg/dL — ABNORMAL HIGH (ref 65–99)
Glucose-Capillary: 481 mg/dL — ABNORMAL HIGH (ref 65–99)
Glucose-Capillary: 494 mg/dL — ABNORMAL HIGH (ref 65–99)
Glucose-Capillary: 390 mg/dL — ABNORMAL HIGH (ref 65–99)
Glucose-Capillary: 330 mg/dL — ABNORMAL HIGH (ref 65–99)
Glucose-Capillary: 418 mg/dL — ABNORMAL HIGH (ref 65–99)
Glucose-Capillary: 412 mg/dL — ABNORMAL HIGH (ref 65–99)
Glucose-Capillary: 271 mg/dL — ABNORMAL HIGH (ref 65–99)
Glucose-Capillary: 210 mg/dL — ABNORMAL HIGH (ref 65–99)
Glucose-Capillary: 209 mg/dL — ABNORMAL HIGH (ref 65–99)
Glucose-Capillary: 105 mg/dL — ABNORMAL HIGH (ref 65–99)
Glucose-Capillary: 175 mg/dL — ABNORMAL HIGH (ref 65–99)
Glucose-Capillary: 275 mg/dL — ABNORMAL HIGH (ref 65–99)
Glucose-Capillary: 275 mg/dL — ABNORMAL HIGH (ref 65–99)

## 2016-03-14 LAB — BASIC METABOLIC PANEL
Anion gap: 7 (ref 5–15)
Anion gap: 12 (ref 5–15)
Anion gap: 16 — ABNORMAL HIGH (ref 5–15)
Anion gap: 11 (ref 5–15)
Anion gap: 8 (ref 5–15)
Anion gap: 11 (ref 5–15)
BUN: 19 mg/dL (ref 6–20)
BUN: 20 mg/dL (ref 6–20)
BUN: 21 mg/dL — ABNORMAL HIGH (ref 6–20)
BUN: 24 mg/dL — ABNORMAL HIGH (ref 6–20)
BUN: 22 mg/dL — ABNORMAL HIGH (ref 6–20)
BUN: 22 mg/dL — ABNORMAL HIGH (ref 6–20)
CO2: 26 mmol/L (ref 22–32)
CO2: 21 mmol/L — ABNORMAL LOW (ref 22–32)
CO2: 16 mmol/L — ABNORMAL LOW (ref 22–32)
CO2: 25 mmol/L (ref 22–32)
CO2: 24 mmol/L (ref 22–32)
CO2: 21 mmol/L — ABNORMAL LOW (ref 22–32)
Calcium: 9.4 mg/dL (ref 8.9–10.3)
Calcium: 8.8 mg/dL — ABNORMAL LOW (ref 8.9–10.3)
Calcium: 9.1 mg/dL (ref 8.9–10.3)
Calcium: 9.1 mg/dL (ref 8.9–10.3)
Calcium: 9 mg/dL (ref 8.9–10.3)
Calcium: 8.6 mg/dL — ABNORMAL LOW (ref 8.9–10.3)
Chloride: 104 mmol/L (ref 101–111)
Chloride: 98 mmol/L — ABNORMAL LOW (ref 101–111)
Chloride: 97 mmol/L — ABNORMAL LOW (ref 101–111)
Chloride: 96 mmol/L — ABNORMAL LOW (ref 101–111)
Chloride: 103 mmol/L (ref 101–111)
Chloride: 102 mmol/L (ref 101–111)
Creatinine, Ser: 0.64 mg/dL (ref 0.61–1.24)
Creatinine, Ser: 0.59 mg/dL — ABNORMAL LOW (ref 0.61–1.24)
Creatinine, Ser: 1.08 mg/dL (ref 0.61–1.24)
Creatinine, Ser: 0.75 mg/dL (ref 0.61–1.24)
Creatinine, Ser: 0.6 mg/dL — ABNORMAL LOW (ref 0.61–1.24)
Creatinine, Ser: 0.67 mg/dL (ref 0.61–1.24)
GFR calc Af Amer: >60 mL/min (ref >60)
GFR calc Af Amer: >60 mL/min (ref >60)
GFR calc Af Amer: >60 mL/min (ref >60)
GFR calc Af Amer: >60 mL/min (ref >60)
GFR calc Af Amer: >60 mL/min (ref >60)
GFR calc Af Amer: >60 mL/min (ref >60)
GFR calc non Af Amer: >60 mL/min (ref >60)
GFR calc non Af Amer: >60 mL/min (ref >60)
GFR calc non Af Amer: >60 mL/min (ref >60)
GFR calc non Af Amer: >60 mL/min (ref >60)
GFR calc non Af Amer: >60 mL/min (ref >60)
GFR calc non Af Amer: >60 mL/min (ref >60)
Glucose, Bld: 167 mg/dL — ABNORMAL HIGH (ref 65–99)
Glucose, Bld: 477 mg/dL — ABNORMAL HIGH (ref 65–99)
Glucose, Bld: 555 mg/dL (ref 65–99)
Glucose, Bld: 425 mg/dL — ABNORMAL HIGH (ref 65–99)
Glucose, Bld: 190 mg/dL — ABNORMAL HIGH (ref 65–99)
Glucose, Bld: 271 mg/dL — ABNORMAL HIGH (ref 65–99)
Potassium: 3.7 mmol/L (ref 3.5–5.1)
Potassium: 4.4 mmol/L (ref 3.5–5.1)
Potassium: 4.4 mmol/L (ref 3.5–5.1)
Potassium: 4.1 mmol/L (ref 3.5–5.1)
Potassium: 3.7 mmol/L (ref 3.5–5.1)
Potassium: 3.8 mmol/L (ref 3.5–5.1)
Sodium: 137 mmol/L (ref 135–145)
Sodium: 131 mmol/L — ABNORMAL LOW (ref 135–145)
Sodium: 129 mmol/L — ABNORMAL LOW (ref 135–145)
Sodium: 132 mmol/L — ABNORMAL LOW (ref 135–145)
Sodium: 135 mmol/L (ref 135–145)
Sodium: 134 mmol/L — ABNORMAL LOW (ref 135–145)

## 2016-03-14 LAB — CBC
HCT: 39.8 % — ABNORMAL LOW (ref 40.0–52.0)
Hemoglobin: 13.3 g/dL (ref 13.0–18.0)
MCH: 30 pg (ref 26.0–34.0)
MCHC: 33.5 g/dL (ref 32.0–36.0)
MCV: 89.7 fL (ref 80.0–100.0)
Platelets: 287 10*3/uL (ref 150–440)
RBC: 4.44 MIL/uL (ref 4.40–5.90)
RDW: 14.3 % (ref 11.5–14.5)
WBC: 10.8 10*3/uL — ABNORMAL HIGH (ref 3.8–10.6)

## 2016-03-14 MED ORDER — INSULIN REGULAR BOLUS VIA INFUSION
0.0000 [IU] | Freq: Three times a day (TID) | INTRAVENOUS | Status: DC
  Administered 2016-03-14 (×2): 3 [IU] via INTRAVENOUS
  Filled 2016-03-14: qty 10

## 2016-03-14 MED ORDER — SODIUM CHLORIDE 0.9 % IV SOLN
INTRAVENOUS | Status: DC
  Filled 2016-03-14: qty 2.5

## 2016-03-14 MED ORDER — SODIUM CHLORIDE 0.9 % IV SOLN: INTRAVENOUS | Status: DC

## 2016-03-14 MED ORDER — INSULIN REGULAR HUMAN 100 UNIT/ML IJ SOLN
INTRAMUSCULAR | Status: DC
  Administered 2016-03-14: 4.3 [IU]/h via INTRAVENOUS
  Filled 2016-03-14: qty 2.5

## 2016-03-14 MED ORDER — DEXTROSE-NACL 5-0.45 % IV SOLN
INTRAVENOUS | Status: DC
  Administered 2016-03-14: 100 mL/h via INTRAVENOUS

## 2016-03-14 MED ORDER — INSULIN REGULAR BOLUS VIA INFUSION
0.0000 [IU] | Freq: Three times a day (TID) | INTRAVENOUS | Status: DC
  Administered 2016-03-14: 7.7 [IU] via INTRAVENOUS
  Administered 2016-03-15: 4.6 [IU] via INTRAVENOUS
  Administered 2016-03-15: 2.6 [IU] via INTRAVENOUS
  Administered 2016-03-15: 2 [IU] via INTRAVENOUS
  Filled 2016-03-14: qty 10

## 2016-03-14 MED ORDER — SODIUM CHLORIDE 0.9 % IV SOLN
INTRAVENOUS | Status: DC
  Administered 2016-03-14: 125 mL/h via INTRAVENOUS
  Administered 2016-03-15: 23:00:00 via INTRAVENOUS

## 2016-03-14 MED ORDER — OXYCODONE-ACETAMINOPHEN 5-325 MG PO TABS
1.0000 | ORAL_TABLET | Freq: Four times a day (QID) | ORAL | Status: DC | PRN
  Administered 2016-03-14 – 2016-03-16 (×7): 1 via ORAL
  Filled 2016-03-14 (×7): qty 1

## 2016-03-14 MED ORDER — DEXTROSE 50 % IV SOLN: 25.0000 mL | INTRAVENOUS | Status: DC | PRN

## 2016-03-14 MED ORDER — DEXTROSE 50 % IV SOLN
25.0000 mL | INTRAVENOUS | Status: DC | PRN
Start: 2016-03-14 — End: 2016-03-14

## 2016-03-14 MED ORDER — SODIUM CHLORIDE 0.9 % IV SOLN
INTRAVENOUS | Status: DC
  Administered 2016-03-14: 10:00:00 via INTRAVENOUS

## 2016-03-14 MED ORDER — OXYCODONE-ACETAMINOPHEN 5-325 MG PO TABS
1.0000 | ORAL_TABLET | Freq: Once | ORAL | Status: AC
  Administered 2016-03-14: 1 via ORAL
  Filled 2016-03-14: qty 1

## 2016-03-14 NOTE — Progress Notes (Signed)
[facility] - [place] at [facility]   PATIENT NAME: Trevor Brown    MR#:  [id number]  DATE OF BIRTH:  Jan 23, 1991  SUBJECTIVE:  CHIEF COMPLAINT:   Chief Complaint  Patient presents with  . Hyperglycemia  walking around in hallways, sugars uncontrolled requiring ICU-Stepdown transfer, keeps requesting more narcotics REVIEW OF SYSTEMS:  Review of Systems  Constitutional: Negative for chills, fever and weight loss.  HENT: Negative for nosebleeds and sore throat.   Eyes: Negative for blurred vision.  Respiratory: Negative for cough, shortness of breath and wheezing.   Cardiovascular: Negative for chest pain, orthopnea, leg swelling and PND.  Gastrointestinal: Negative for abdominal pain, constipation, diarrhea, heartburn, nausea and vomiting.  Genitourinary: Negative for dysuria and urgency.  Musculoskeletal: Positive for back pain.  Skin: Negative for rash.  Neurological: Negative for dizziness, speech change, focal weakness and headaches.  Endo/Heme/Allergies: Does not bruise/bleed easily.  Psychiatric/Behavioral: Negative for depression.   DRUG ALLERGIES:   Allergies  Allergen Reactions  . Bee Venom Anaphylaxis  . Penicillins Anaphylaxis, Hives and Other (See Comments)    Has patient had a PCN reaction causing immediate rash, facial/tongue/throat swelling, SOB or lightheadedness with hypotension: Yes Has patient had a PCN reaction causing severe rash involving mucus membranes or skin necrosis: No Has patient had a PCN reaction that required hospitalization No Has patient had a PCN reaction occurring within the last 10 years: Yes If all of the above answers are "NO", then may proceed with Cephalosporin use.  . Ibuprofen Other (See Comments)    Reaction:  GI upset   . Tramadol Hives and Swelling  . Vancomycin Rash and Other (See Comments)    Reaction:  Red man's syndrome    VITALS:  Blood pressure 138/79, pulse 74, temperature 98.3 F (36.8 C),  temperature source Oral, resp. rate (!) 26, height 5\' 4"  (1.626 m), weight 54 kg (119 lb 0.8 oz), SpO2 99 %. PHYSICAL EXAMINATION:  Physical Exam  Constitutional: He is oriented to person, place, and time and well-developed, well-nourished, and in no distress.  HENT:  Head: Normocephalic and atraumatic.  Eyes: Conjunctivae and EOM are normal. Pupils are equal, round, and reactive to light.  Neck: Normal range of motion. Neck supple. No tracheal deviation present. No thyromegaly present.  Cardiovascular: Normal rate, regular rhythm and normal heart sounds.   Pulmonary/Chest: Effort normal and breath sounds normal. No respiratory distress. He has no wheezes. He exhibits no tenderness.  Abdominal: Soft. Bowel sounds are normal. He exhibits no distension. There is no tenderness.  Musculoskeletal: Normal range of motion.  Neurological: He is alert and oriented to person, place, and time. No cranial nerve deficit.  Skin: Skin is warm and dry. No rash noted.  Psychiatric: Mood and affect normal.   LABORATORY PANEL:   CBC  Recent Labs Lab 03/14/16 0529  WBC 10.8*  HGB 13.3  HCT 39.8*  PLT 287   ------------------------------------------------------------------------------------------------------------------ Chemistries   Recent Labs Lab 03/12/16 0815  03/14/16 0955  NA 125*  < > 129*  K 4.9  < > 4.4  CL 92*  < > 97*  CO2 14*  < > 16*  GLUCOSE 784*  < > 555*  BUN 23*  < > 21*  CREATININE 1.10  < > 1.08  CALCIUM 8.8*  < > 9.1  AST 85*  --   --   ALT 123*  --   --   ALKPHOS 259*  --   --   BILITOT 1.4*  --   --   < > =  values in this interval not displayed. RADIOLOGY:  No results found. ASSESSMENT AND PLAN:  Trevor Brown  is a 25 y.o. male with a known history of Type 1 diabetes insulin requiring who has been noncompliant with his insulin secondary to financial constraints and living in the shelter home was just seen in the emergency room a few days ago was set up with  outpatient follow-up and was also given a glucometer to check his sugars however patient stays with his insulin and his glucometer got stolen and had nowhere to go  1. Acute diabetic ketoacidosis in the setting of type 1 diabetes with noncompliance to insulin secondary to financial constraints and living in the shelter home - sugars running 400-500s - will need insulin drip so transfer back to ICU-Stepdown  2. Generalized body ache -When necessary Tylenol - on percocet to Q 12 prn instead of Q 4 hrs (I do not see any obvious indication for same) but keep demanding, requesting different provider if I don't give narcotics that he demands - Strong suspicion for narcotic seeking  3. Type 1 diabetes noncompliance secondary to financial constraints and living in a homeless shelter -Care management for discharge planning  4.  Hyponatremia - Improving with IV hydration - Sodium went up from 125 -> 129  5. Tobacco abuse advised smoking cessation 3 minutes spent. ordered nicotine patch   Back to ICU-Stepdown for Insulin drip  All the records are reviewed and case discussed with Care Management/Social Worker. Management plans discussed with the patient, nursing and they are in agreement.  CODE STATUS: Full code  TOTAL TIME TAKING CARE OF THIS PATIENT: 35 minutes.   More than 50% of the time was spent in counseling/coordination of care: YES  POSSIBLE D/C IN 1-2 DAYS, DEPENDING ON CLINICAL CONDITION.  And better blood sugar control   [facility], Deberah Adolf M.D on 03/14/2016 at 2:12 PM  Between 7am to 6pm - Pager - [phone]  After 6pm go to [email or web address] - Scientist, research (life sciences) [facility]  Office  [phone]  CC: Primary care physician; No PCP Per Patient  Note: This dictation was prepared with Dragon dictation along with smaller phrase technology. Any transcriptional errors that result from this process are unintentional.

## 2016-03-14 NOTE — Care Management (Signed)
Back to ICU today. [facility] application delivered to patient with appointment time- patient nodded head "yes"  [facility] at 10:30AM.

## 2016-03-14 NOTE — Progress Notes (Signed)
Inpatient Diabetes Program Recommendations  AACE/ADA: New Consensus Statement on Inpatient Glycemic Control (2015)  Target Ranges:  Prepandial:   less than 140 mg/dL      Peak postprandial:   less than 180 mg/dL (1-2 hours)      Critically ill patients:  140 - 180 mg/dL   Lab Results  Component Value Date   GLUCAP 481 (H) 03/14/2016   HGBA1C 13.4 (H) 02/19/2016    Review of Glycemic Control  Results for Trevor Brown, Wilmore B (MRN [id number]) as of 03/14/2016 09:41  Ref. Range 03/13/2016 16:08 03/13/2016 20:32 03/13/2016 21:26 03/14/2016 02:55 03/14/2016 07:33  Glucose-Capillary Latest Ref Range: 65 - 99 mg/dL 409481 (H) 811320 (H) 914313 (H) 271 (H) 481 (H)   Diabetes history: Type 1 Outpatient Diabetes medications: Novolog 70/30 24 units bid- not recently taken  Inpatient Diabetes Program Recommendations:   While on IV insulin, if patient is eating cover carbohydrate (CHO) intake using IV insulin/CHO coverage via Glucostabilizer         (1 unit for every 10 grams of CHO)  Transition to SQ insulin  RN discontinue insulin drip 2 hours after subcutaneous 70/30 insulin givenand simultaneously give Novolog correction scale.  Since patient is eating regualrly, AND ONCE HE IS NO LONGER ON IV INSULIN, please order Novolog 1 units for every 10 grams of carb q4h (carbohydrate are listed on his menu) along with his 70/30 insulin.  Susette RacerJulie Godfrey Tritschler, RN, BA, MHA, CDE Diabetes Coordinator Inpatient Diabetes Program  [phone] (Team Pager) [phone] [facility] Office) 03/14/2016 10:04 AM

## 2016-03-14 NOTE — Progress Notes (Signed)
Inpatient Diabetes Program Recommendations  AACE/ADA: New Consensus Statement on Inpatient Glycemic Control (2015)  Target Ranges:  Prepandial:   less than 140 mg/dL      Peak postprandial:   less than 180 mg/dL (1-2 hours)      Critically ill patients:  140 - 180 mg/dL   Lab Results  Component Value Date   GLUCAP 271 (H) 03/14/2016   HGBA1C 13.4 (H) 02/19/2016    Review of Glycemic Control  Results for Trevor Brown, Vicente B (MRN [id number]) as of 03/14/2016 07:20  Ref. Range 03/13/2016 12:34 03/13/2016 16:08 03/13/2016 20:32 03/13/2016 21:26 03/14/2016 02:55  Glucose-Capillary Latest Ref Range: 65 - 99 mg/dL 409314 (H) 811481 (H) 914320 (H) 313 (H) 271 (H)     Diabetes history: DM1 Outpatient Diabetes medications: 70/30 24 units BID Current orders for Inpatient glycemic control: 70/30 30 units BID, Novolog 0-20 units TID with meals and hs  Inpatient Diabetes Program Recommendations: Blood sugars remain elevated despite high doses of insulin yesterday- may do better with IV insulin to safely bring down blood sugars and decrease the risk of hypoglycemia.   CBG 271mg /dl at 7:82NF2:55am but it appears the patient ate. Fasting blood sugar 481mg /dl   Please consider reordering DKA order set and transfer patient to ICU for IV insulin per GlucoStabilizer. Text paged Dr. Sherryll BurgerShah.  Susette RacerJulie Jahleah Mariscal, RN, BA, MHA, CDE Diabetes Coordinator Inpatient Diabetes Program  [phone] (Team Pager) [phone] [facility] Office) 03/14/2016 7:36 AM

## 2016-03-14 NOTE — Progress Notes (Signed)
Patient's blood sugar was 481 this am. Notified MD and diabetic coordinator. Reviewed new orders to transfer patient to ICU. Went to tell patient and he was not in his room, nor the floor. Called security . Patient was found walking back to his room. Told him that he wasn't suppose to leave the floor. Patient stated he was "taking a walk". Called ICU to give report and they were going to call back.  Once that was completed, this RN went into room and patient again was not in there.  Called security again.  This RN found patient walking back for the visitor's entrance with a strong odor of smoke. Once patient got back into room, again tried to explained to patient that he can't leave the room or smoke while he is here.  This RN was brought up to ICU via wheelchair and he belongings.

## 2016-03-14 NOTE — Progress Notes (Signed)
Per diabetes coordinator discussion, discussed with patient about all subcutaneous insulin while in hospital being given in arms and not abdomen. Patient reports that he will be fine with getting subcutaneous insulin in abdomen in the future to promote absorption.

## 2016-03-14 NOTE — Progress Notes (Signed)
Spoke with MD, Dr. Sherryll BurgerShah, on the phone about patient's pain level and only every 12 hours prn medication. RN had iniitally given patient's prn tylenol, but patient reported pain had increased (generalized pain). MD ordered one time dose of patient's prn percocet to help relieve pain. Team will continue to monitor.

## 2016-03-14 NOTE — Progress Notes (Signed)
Receiving RN reviewing orders for patient, clarified with MD over the phone about orders for IV insulin and diet and ordersets.

## 2016-03-14 NOTE — Progress Notes (Signed)
Patient requesting multiple nourishments and beverages despite education given.

## 2016-03-15 LAB — GLUCOSE, CAPILLARY
Glucose-Capillary: 113 mg/dL — ABNORMAL HIGH (ref 65–99)
Glucose-Capillary: 116 mg/dL — ABNORMAL HIGH (ref 65–99)
Glucose-Capillary: 126 mg/dL — ABNORMAL HIGH (ref 65–99)
Glucose-Capillary: 135 mg/dL — ABNORMAL HIGH (ref 65–99)
Glucose-Capillary: 140 mg/dL — ABNORMAL HIGH (ref 65–99)
Glucose-Capillary: 142 mg/dL — ABNORMAL HIGH (ref 65–99)
Glucose-Capillary: 182 mg/dL — ABNORMAL HIGH (ref 65–99)
Glucose-Capillary: 185 mg/dL — ABNORMAL HIGH (ref 65–99)
Glucose-Capillary: 194 mg/dL — ABNORMAL HIGH (ref 65–99)
Glucose-Capillary: 195 mg/dL — ABNORMAL HIGH (ref 65–99)
Glucose-Capillary: 195 mg/dL — ABNORMAL HIGH (ref 65–99)
Glucose-Capillary: 205 mg/dL — ABNORMAL HIGH (ref 65–99)
Glucose-Capillary: 220 mg/dL — ABNORMAL HIGH (ref 65–99)
Glucose-Capillary: 224 mg/dL — ABNORMAL HIGH (ref 65–99)
Glucose-Capillary: 229 mg/dL — ABNORMAL HIGH (ref 65–99)
Glucose-Capillary: 232 mg/dL — ABNORMAL HIGH (ref 65–99)
Glucose-Capillary: 251 mg/dL — ABNORMAL HIGH (ref 65–99)
Glucose-Capillary: 274 mg/dL — ABNORMAL HIGH (ref 65–99)
Glucose-Capillary: 362 mg/dL — ABNORMAL HIGH (ref 65–99)
Glucose-Capillary: 42 mg/dL — CL (ref 65–99)
Glucose-Capillary: 77 mg/dL (ref 65–99)
Glucose-Capillary: 93 mg/dL (ref 65–99)

## 2016-03-15 LAB — BASIC METABOLIC PANEL
Anion gap: 7 (ref 5–15)
Anion gap: 9 (ref 5–15)
Anion gap: 9 (ref 5–15)
BUN: 18 mg/dL (ref 6–20)
BUN: 16 mg/dL (ref 6–20)
BUN: 17 mg/dL (ref 6–20)
CO2: 25 mmol/L (ref 22–32)
CO2: 24 mmol/L (ref 22–32)
CO2: 23 mmol/L (ref 22–32)
Calcium: 8.7 mg/dL — ABNORMAL LOW (ref 8.9–10.3)
Calcium: 8.8 mg/dL — ABNORMAL LOW (ref 8.9–10.3)
Calcium: 8.7 mg/dL — ABNORMAL LOW (ref 8.9–10.3)
Chloride: 105 mmol/L (ref 101–111)
Chloride: 104 mmol/L (ref 101–111)
Chloride: 103 mmol/L (ref 101–111)
Creatinine, Ser: 0.52 mg/dL — ABNORMAL LOW (ref 0.61–1.24)
Creatinine, Ser: 0.52 mg/dL — ABNORMAL LOW (ref 0.61–1.24)
Creatinine, Ser: 0.57 mg/dL — ABNORMAL LOW (ref 0.61–1.24)
GFR calc Af Amer: >60 mL/min (ref >60)
GFR calc Af Amer: >60 mL/min (ref >60)
GFR calc Af Amer: >60 mL/min (ref >60)
GFR calc non Af Amer: >60 mL/min (ref >60)
GFR calc non Af Amer: >60 mL/min (ref >60)
GFR calc non Af Amer: >60 mL/min (ref >60)
Glucose, Bld: 113 mg/dL — ABNORMAL HIGH (ref 65–99)
Glucose, Bld: 168 mg/dL — ABNORMAL HIGH (ref 65–99)
Glucose, Bld: 151 mg/dL — ABNORMAL HIGH (ref 65–99)
Potassium: 3.1 mmol/L — ABNORMAL LOW (ref 3.5–5.1)
Potassium: 3.7 mmol/L (ref 3.5–5.1)
Potassium: 3.5 mmol/L (ref 3.5–5.1)
Sodium: 137 mmol/L (ref 135–145)
Sodium: 137 mmol/L (ref 135–145)
Sodium: 135 mmol/L (ref 135–145)

## 2016-03-15 MED ORDER — INSULIN ASPART 100 UNIT/ML ~~LOC~~ SOLN: 1.0000 [IU] | SUBCUTANEOUS | Status: DC | PRN

## 2016-03-15 MED ORDER — INSULIN ASPART PROT & ASPART (70-30 MIX) 100 UNIT/ML ~~LOC~~ SUSP
30.0000 [IU] | Freq: Two times a day (BID) | SUBCUTANEOUS | Status: DC
  Administered 2016-03-15 – 2016-03-16 (×2): 30 [IU] via SUBCUTANEOUS
  Filled 2016-03-15 (×2): qty 30

## 2016-03-15 MED ORDER — INSULIN ASPART 100 UNIT/ML ~~LOC~~ SOLN: 1.0000 [IU] | Freq: Three times a day (TID) | SUBCUTANEOUS | Status: DC

## 2016-03-15 MED ORDER — INSULIN ASPART 100 UNIT/ML ~~LOC~~ SOLN: 0.0000 [IU] | Freq: Three times a day (TID) | SUBCUTANEOUS | Status: DC

## 2016-03-15 MED ORDER — INSULIN ASPART 100 UNIT/ML ~~LOC~~ SOLN: 0.0000 [IU] | SUBCUTANEOUS | Status: DC

## 2016-03-15 MED ORDER — INSULIN ASPART 100 UNIT/ML ~~LOC~~ SOLN
0.0000 [IU] | Freq: Three times a day (TID) | SUBCUTANEOUS | Status: DC
  Administered 2016-03-15: 9 [IU] via SUBCUTANEOUS
  Administered 2016-03-16: 7 [IU] via SUBCUTANEOUS
  Administered 2016-03-16: 5 [IU] via SUBCUTANEOUS
  Filled 2016-03-15: qty 9
  Filled 2016-03-15: qty 7
  Filled 2016-03-15: qty 5

## 2016-03-15 MED ORDER — INSULIN ASPART 100 UNIT/ML ~~LOC~~ SOLN
1.0000 [IU] | Freq: Three times a day (TID) | SUBCUTANEOUS | Status: DC
Start: 2016-03-15 — End: 2016-03-16
  Administered 2016-03-15 – 2016-03-16 (×3): 1 [IU] via SUBCUTANEOUS
  Filled 2016-03-15 (×3): qty 1

## 2016-03-15 MED ORDER — INSULIN ASPART 100 UNIT/ML ~~LOC~~ SOLN: 0.0000 [IU] | Freq: Every day | SUBCUTANEOUS | Status: DC

## 2016-03-15 NOTE — Progress Notes (Signed)
Pt's CBG dropped to 42 just prior to receiving his dinner tray. Pt remained conscious and alert, requesting graham crackers. Pt given graham crackers and peanut butter as well as his dinner tray. Re-checked CBG after pt ate dinner and same 140 mg/dL. No changes made to the glucose stabilizer/insulin drip. Attempted to page diabetes coordinator without a callback. Will continue to monitor pt closely at this time.

## 2016-03-15 NOTE — Progress Notes (Signed)
Inpatient Diabetes Program Recommendations  AACE/ADA: New Consensus Statement on Inpatient Glycemic Control (2015)  Target Ranges:  Prepandial:   less than 140 mg/dL      Peak postprandial:   less than 180 mg/dL (1-2 hours)      Critically ill patients:  140 - 180 mg/dL   Lab Results  Component Value Date   GLUCAP 220 (H) 03/15/2016   HGBA1C 13.4 (H) 02/19/2016   Spoke with patient at the bedside. I examined his arms and verified that he does indeed have hard places on his arms where he has been injecting insulin. He had noted this.  He was using his arms only (never his legs) because he found that his abdomen had also had the hard lumps on them. I have asked him to use the entire area of his abdomen to administer the insulin when he goes home and to avoid any area with hard lumps. He had no questions.   Susette RacerJulie Maimuna Leaman, RN, BA, MHA, CDE Diabetes Coordinator Inpatient Diabetes Program  [phone] (Team Pager) [phone] [facility] Office) 03/15/2016 1:48 PM

## 2016-03-15 NOTE — Progress Notes (Addendum)
[facility] - [place] at [facility]   PATIENT NAME: Trevor Brown    MR#:  [id number]  DATE OF BIRTH:  13-Jul-1991  SUBJECTIVE:  CHIEF COMPLAINT:   Chief Complaint  Patient presents with  . Hyperglycemia  sugars well controlled on insulin drip. No complaints REVIEW OF SYSTEMS:  Review of Systems  Constitutional: Negative for chills, fever and weight loss.  HENT: Negative for nosebleeds and sore throat.   Eyes: Negative for blurred vision.  Respiratory: Negative for cough, shortness of breath and wheezing.   Cardiovascular: Negative for chest pain, orthopnea, leg swelling and PND.  Gastrointestinal: Negative for abdominal pain, constipation, diarrhea, heartburn, nausea and vomiting.  Genitourinary: Negative for dysuria and urgency.  Musculoskeletal: Positive for back pain.  Skin: Negative for rash.  Neurological: Negative for dizziness, speech change, focal weakness and headaches.  Endo/Heme/Allergies: Does not bruise/bleed easily.  Psychiatric/Behavioral: Negative for depression.   DRUG ALLERGIES:   Allergies  Allergen Reactions  . Bee Venom Anaphylaxis  . Penicillins Anaphylaxis, Hives and Other (See Comments)    Has patient had a PCN reaction causing immediate rash, facial/tongue/throat swelling, SOB or lightheadedness with hypotension: Yes Has patient had a PCN reaction causing severe rash involving mucus membranes or skin necrosis: No Has patient had a PCN reaction that required hospitalization No Has patient had a PCN reaction occurring within the last 10 years: Yes If all of the above answers are "NO", then may proceed with Cephalosporin use.  . Ibuprofen Other (See Comments)    Reaction:  GI upset   . Tramadol Hives and Swelling  . Vancomycin Rash and Other (See Comments)    Reaction:  Red man's syndrome    VITALS:  Blood pressure 129/80, pulse 73, temperature 97.6 F (36.4 C), temperature source Oral, resp. rate (!) 27, height 5\' 4"  (1.626 m),  weight 54 kg (119 lb 0.8 oz), SpO2 100 %. PHYSICAL EXAMINATION:  Physical Exam  Constitutional: He is oriented to person, place, and time and well-developed, well-nourished, and in no distress.  HENT:  Head: Normocephalic and atraumatic.  Eyes: Conjunctivae and EOM are normal. Pupils are equal, round, and reactive to light.  Neck: Normal range of motion. Neck supple. No tracheal deviation present. No thyromegaly present.  Cardiovascular: Normal rate, regular rhythm and normal heart sounds.   Pulmonary/Chest: Effort normal and breath sounds normal. No respiratory distress. He has no wheezes. He exhibits no tenderness.  Abdominal: Soft. Bowel sounds are normal. He exhibits no distension. There is no tenderness.  Musculoskeletal: Normal range of motion.  Neurological: He is alert and oriented to person, place, and time. No cranial nerve deficit.  Skin: Skin is warm and dry. No rash noted.  Psychiatric: Mood and affect normal.   LABORATORY PANEL:   CBC  Recent Labs Lab 03/14/16 0529  WBC 10.8*  HGB 13.3  HCT 39.8*  PLT 287   ------------------------------------------------------------------------------------------------------------------ Chemistries   Recent Labs Lab 03/12/16 0815  03/15/16 0948  NA 125*  < > 135  K 4.9  < > 3.5  CL 92*  < > 103  CO2 14*  < > 23  GLUCOSE 784*  < > 151*  BUN 23*  < > 17  CREATININE 1.10  < > 0.57*  CALCIUM 8.8*  < > 8.7*  AST 85*  --   --   ALT 123*  --   --   ALKPHOS 259*  --   --   BILITOT 1.4*  --   --   < > =  values in this interval not displayed. RADIOLOGY:  No results found. ASSESSMENT AND PLAN:  Trevor Brown  is a 25 y.o. male with a known history of Type 1 diabetes insulin requiring who has been noncompliant with his insulin secondary to financial constraints and living in the shelter home was just seen in the emergency room a few days ago was set up with outpatient follow-up and was also given a glucometer to check his sugars  however patient stays with his insulin and his glucometer got stolen and had nowhere to go  1. Acute diabetic ketoacidosis in the setting of type 1 diabetes with noncompliance to insulin secondary to financial constraints and living in the shelter home - sugars running 400-500s - wean off insulin drip and transition of SQ insulin along with SSI  2. Generalized body ache -When necessary Tylenol - on percocet to Q 12 prn (I do not see any obvious indication for same) but keep demanding, requesting different provider if I don't give narcotics that he demands - Strong suspicion for narcotic seeking  3. Type 1 diabetes noncompliance secondary to financial constraints and living in a homeless shelter -Care management for discharge planning  4.  Hyponatremia - Improving with IV hydration - Sodium went up from 125 -> 129  5. Tooth abscess: on clinda   Tobacco abuse advised smoking cessation 3 minutes spent. ordered nicotine patch   Back to floors once off Insulin drip  All the records are reviewed and case discussed with Care Management/Social Worker. Management plans discussed with the patient, nursing and they are in agreement.  CODE STATUS: Full code  TOTAL TIME TAKING CARE OF THIS PATIENT: 35 minutes.   More than 50% of the time was spent in counseling/coordination of care: YES  POSSIBLE D/C IN 1-2 DAYS, DEPENDING ON CLINICAL CONDITION.  And better blood sugar control   Gwendloyn Forsee M.D on 03/15/2016 at 4:19 PM  Between 7am to 6pm - Pager - [phone]  After 6pm go to [email or web address] - Scientist, research (life sciences) [place]  Office  [phone]  CC: Primary care physician; No PCP Per Patient  Note: This dictation was prepared with Dragon dictation along with smaller phrase technology. Any transcriptional errors that result from this process are unintentional.

## 2016-03-15 NOTE — Progress Notes (Signed)
Inpatient Diabetes Program Recommendations  AACE/ADA: New Consensus Statement on Inpatient Glycemic Control (2015)  Target Ranges:  Prepandial:   less than 140 mg/dL      Peak postprandial:   less than 180 mg/dL (1-2 hours)      Critically ill patients:  140 - 180 mg/dL   Lab Results  Component Value Date   GLUCAP 142 (H) 03/15/2016   HGBA1C 13.4 (H) 02/19/2016     Diabetes history: Type 1 Outpatient Diabetes medications: Novolog 70/30 24 units bid- not recently taken  Inpatient Diabetes Program Recommendations:   Results for [name], Trevor B (MRN [id number]) as of 03/15/2016 07:21  Ref. Range 03/15/2016 04:01 03/15/2016 05:04 03/15/2016 05:07 03/15/2016 06:03 03/15/2016 07:11  Glucose-Capillary Latest Ref Range: 65 - 99 mg/dL 147126 (H) 829224 (H) 562205 (H) 194 (H) 142 (H)   On drip  Transition to SQ insulin when 4 blood sugars are within or below goal (140-180mg /dl)(anticipate supper time this evening)  RN discontinue insulin drip 2 hours after (approx. 7pm tonight) subcutaneous basal insulin givenand simultaneously give Novolog correction scale.  Based on current blood sugars, recommend leaving him on the insulin drip until this evening when he can get his 70/30 insulin at supper then 2 hour d/c the insulin drip and give correction at the time the drip is stopped.   Please document dietary intake and give Novolog correction for all food via Glucostabilizer administration.  Off drip  Once he is transitioned to the 70/30, please do not allow him to eat snacks closer than q4h.  Bolus Novolog correction can be given at lunch, at hs and 2am to cover the carbs he eats.   MD, for transition off the insulin drip, please write an order for 1.  carbohydrate coverage for lunch time, hs and 2am, 1 unit of Novolog for every 10 grams of carbohydrate.   2. He will need an order for Novolog correction insulin , sensitive correction scale 0-9 tid and Novolog 0-5 units qhs.  3. Basal- 70/30 insulin  (will follow and recommend later this afternoon)  Susette RacerJulie Ilaisaane Marts, RN, BA, [place], CDE Diabetes Coordinator Inpatient Diabetes Program  [phone] (Team Pager) [phone] [facility] Office) 03/15/2016 8:04 AM

## 2016-03-15 NOTE — Progress Notes (Signed)
Blood sugars remain elevated despite IV insulin and insulin coverage for carbohydrate intake-  You could keep him on the insulin infusion over night on or start the 70/30 30 units bid now.  Stop insulin drip 2 hours after the 70/30 insulin is given.      Once he is transitioned to the 70/30, please do not allow him to eat snacks closer than q4h.  Bolus Novolog correction can be given at lunch, at hs and 2am to cover the carbs he eats.   MD, for transition off the insulin drip, please write an order for 1.  carbohydrate coverage for lunch time, hs and 2am, 1 unit of Novolog for every 10 grams of carbohydrate.   2. He will need an order for Novolog correction insulin , sensitive correction scale 0-9 tid and Novolog 0-5 units qhs.  3. Basal- 70/30 insulin 30 units bid   Susette RacerJulie Damean Poffenberger, RN, [place], [place], CDE Diabetes Coordinator Inpatient Diabetes Program  [phone] (Team Pager) [phone] [facility] Office) 03/15/2016 4:00 PM

## 2016-03-15 NOTE — Progress Notes (Signed)
Pt.'s sliding scale orders and AC orders were changed r/t transfer to floor orders.  CBG @ 2200: 362, alerted Dr. Anne HahnWillis, discussed current sliding scale orders and amounts. MD increased scale in order to better control CBG's O/N.  Will continue to monitor pt. Closely.

## 2016-03-15 NOTE — Care Management (Addendum)
Medication management is open today until 4PM to assist patient with discharge medications. Please provide patient with prescriptions for his DM at time of discharge (don't send electronically). If patient discharges over the weekend patient may need MATCH assistance from CM (which I'm sure we will have to override). RNCM to continue to follow. I will check to see if we have "another"glucometer for this patient. Please provide him with a prescription for a glucometer and supplies also- [employer] brand is cheaper in the long-run. I have learned that CM does not have any more glucometers available to give to patients- patient will need prescription for glucometer. He has received several in the past however he has lost them or "they were stolen".

## 2016-03-15 NOTE — Progress Notes (Signed)
Pt. Transferred to 1A, [facility]  VSS, pt.'s father and sibling went with pt. And NT.

## 2016-03-16 LAB — CBC
HCT: 36.7 % — ABNORMAL LOW (ref 40.0–52.0)
Hemoglobin: 12.7 g/dL — ABNORMAL LOW (ref 13.0–18.0)
MCH: 30.6 pg (ref 26.0–34.0)
MCHC: 34.6 g/dL (ref 32.0–36.0)
MCV: 88.4 fL (ref 80.0–100.0)
Platelets: 253 10*3/uL (ref 150–440)
RBC: 4.14 MIL/uL — ABNORMAL LOW (ref 4.40–5.90)
RDW: 14.6 % — ABNORMAL HIGH (ref 11.5–14.5)
WBC: 7.7 10*3/uL (ref 3.8–10.6)

## 2016-03-16 LAB — BASIC METABOLIC PANEL
Anion gap: 7 (ref 5–15)
BUN: 19 mg/dL (ref 6–20)
CO2: 23 mmol/L (ref 22–32)
Calcium: 8.7 mg/dL — ABNORMAL LOW (ref 8.9–10.3)
Chloride: 105 mmol/L (ref 101–111)
Creatinine, Ser: 0.55 mg/dL — ABNORMAL LOW (ref 0.61–1.24)
GFR calc Af Amer: >60 mL/min (ref >60)
GFR calc non Af Amer: >60 mL/min (ref >60)
Glucose, Bld: 208 mg/dL — ABNORMAL HIGH (ref 65–99)
Potassium: 3.7 mmol/L (ref 3.5–5.1)
Sodium: 135 mmol/L (ref 135–145)

## 2016-03-16 LAB — GLUCOSE, CAPILLARY
Glucose-Capillary: 298 mg/dL — ABNORMAL HIGH (ref 65–99)
Glucose-Capillary: 313 mg/dL — ABNORMAL HIGH (ref 65–99)

## 2016-03-16 MED ORDER — OXYCODONE-ACETAMINOPHEN 5-325 MG PO TABS: 1.0000 | ORAL_TABLET | Freq: Four times a day (QID) | ORAL | 0 refills | Status: DC | PRN

## 2016-03-16 MED ORDER — CLINDAMYCIN HCL 150 MG PO CAPS: 450.0000 mg | ORAL_CAPSULE | Freq: Four times a day (QID) | ORAL | 0 refills | Status: DC

## 2016-03-16 MED ORDER — INSULIN ASPART PROT & ASPART (70-30 MIX) 100 UNIT/ML ~~LOC~~ SUSP
30.0000 [IU] | Freq: Two times a day (BID) | SUBCUTANEOUS | 1 refills | Status: DC
Start: 2016-03-16 — End: 2016-03-21

## 2016-03-16 MED ORDER — INSULIN ASPART 100 UNIT/ML ~~LOC~~ SOLN: SUBCUTANEOUS | 11 refills | Status: DC

## 2016-03-16 NOTE — Progress Notes (Signed)
Pt discharge home with prescriptions and taxi voucher. Discharge charge instructions explained. Ambulated self to front door per his request.

## 2016-03-16 NOTE — Discharge Summary (Signed)
[facility] - [place] at [facility]   PATIENT NAME: Trevor Brown    MR#:  [id number]  DATE OF BIRTH:  Jan 22, 1991  DATE OF ADMISSION:  03/12/2016 ADMITTING PHYSICIAN: Enedina Finner, MD  DATE OF DISCHARGE: 03/16/16  PRIMARY CARE PHYSICIAN: No PCP Per Patient    ADMISSION DIAGNOSIS:  Diabetic ketoacidosis without coma associated with type 1 diabetes mellitus (HCC) [E10.10]  DISCHARGE DIAGNOSIS:  Active Problems:   DKA (diabetic ketoacidoses) (HCC)   SECONDARY DIAGNOSIS:   Past Medical History:  Diagnosis Date  . Diabetes mellitus without complication (HCC)   . Heart attack (HCC)    Pt claims he had heart attack a year ago ( 2015)- and was admitted in [facility] for that, but not given any meds or angiogram, on review of chart- I could not find any details like that.  . Hepatitis C, acute may 2016    HOSPITAL COURSE:  Trevor Brown  is a 25 y.o. male admitted 03/12/2016 with chief complaint Hyperglycemia . Please see H&P performed by Enedina Finner, MD for further information. Patient presented in DKA secondary to non compliance. He was undergoing treatment for an oral abscess prior to admission as well. He Was placed on insulin drip in ICU, transitioned off without complications. He does complain of some oral pain - checked [place] controlled substance registry, no abuse pattern notified - short course of pain medications prescribed   DISCHARGE CONDITIONS:   stable  CONSULTS OBTAINED:    DRUG ALLERGIES:   Allergies  Allergen Reactions  . Bee Venom Anaphylaxis  . Penicillins Anaphylaxis, Hives and Other (See Comments)    Has patient had a PCN reaction causing immediate rash, facial/tongue/throat swelling, SOB or lightheadedness with hypotension: Yes Has patient had a PCN reaction causing severe rash involving mucus membranes or skin necrosis: No Has patient had a PCN reaction that required hospitalization No Has patient had a PCN reaction occurring within the last 10  years: Yes If all of the above answers are "NO", then may proceed with Cephalosporin use.  . Ibuprofen Other (See Comments)    Reaction:  GI upset   . Tramadol Hives and Swelling  . Vancomycin Rash and Other (See Comments)    Reaction:  Red man's syndrome     DISCHARGE MEDICATIONS:   Current Discharge Medication List    START taking these medications   Details  insulin aspart (NOVOLOG) 100 UNIT/ML injection 0-9 Units, Subcutaneous, 3 times daily before meals & bedtime, First dose on Fri 03/15/16 at 2200 Correction coverage: Sensitive (thin, NPO, renal) CBG < 70: implement hypoglycemia protocol CBG 70 - 120: 0 units CBG 121 - 150: 1 unit CBG 151 - 200: 2 units CBG 201 - 250: 3 units CBG 251 - 300: 5 units CBG 301 - 350: 7 units CBG 351 - 400: 9 units Qty: 10 mL, Refills: 11    oxyCODONE-acetaminophen (PERCOCET/ROXICET) 5-325 MG tablet Take 1 tablet by mouth every 6 (six) hours as needed for severe pain. Qty: 30 tablet, Refills: 0      CONTINUE these medications which have CHANGED   Details  clindamycin (CLEOCIN) 150 MG capsule Take 3 capsules (450 mg total) by mouth 4 (four) times daily. Qty: 120 capsule, Refills: 0    insulin aspart protamine- aspart (NOVOLOG MIX 70/30) (70-30) 100 UNIT/ML injection Inject 0.3 mLs (30 Units total) into the skin 2 (two) times daily with a meal. Qty: 10 mL, Refills: 1         DISCHARGE  INSTRUCTIONS:    DIET:  Diabetic diet  DISCHARGE CONDITION:  Stable  ACTIVITY:  Activity as tolerated  OXYGEN:  Home Oxygen: No.   Oxygen Delivery: room air  DISCHARGE LOCATION:  home   If you experience worsening of your admission symptoms, develop shortness of breath, life threatening emergency, suicidal or homicidal thoughts you must seek medical attention immediately by calling 911 or calling your MD immediately  if symptoms less severe.  You Must read complete instructions/literature along with all the possible adverse reactions/side  effects for all the Medicines you take and that have been prescribed to you. Take any new Medicines after you have completely understood and accpet all the possible adverse reactions/side effects.   Please note  You were cared for by a hospitalist during your hospital stay. If you have any questions about your discharge medications or the care you received while you were in the hospital after you are discharged, you can call the unit and asked to speak with the hospitalist on call if the hospitalist that took care of you is not available. Once you are discharged, your primary care physician will handle any further medical issues. Please note that NO REFILLS for any discharge medications will be authorized once you are discharged, as it is imperative that you return to your primary care physician (or establish a relationship with a primary care physician if you do not have one) for your aftercare needs so that they can reassess your need for medications and monitor your lab values.    On the day of Discharge:   VITAL SIGNS:  Blood pressure 128/71, pulse (!) 57, temperature 97.7 F (36.5 C), temperature source Oral, resp. rate 18, height 5\' 4"  (1.626 m), weight 54 kg (119 lb 0.8 oz), SpO2 100 %.  I/O:   Intake/Output Summary (Last 24 hours) at 03/16/16 0929 Last data filed at 03/16/16 0500  Gross per 24 hour  Intake          2552.91 ml  Output             1975 ml  Net           577.91 ml    PHYSICAL EXAMINATION:  GENERAL:  25 y.o.-year-old patient lying in the bed with no acute distress.  EYES: Pupils equal, round, reactive to light and accommodation. No scleral icterus. Extraocular muscles intact.  HEENT: Head atraumatic, normocephalic. Oropharynx and nasopharynx clear.  NECK:  Supple, no jugular venous distention. No thyroid enlargement, no tenderness.  LUNGS: Normal breath sounds bilaterally, no wheezing, rales,rhonchi or crepitation. No use of accessory muscles of respiration.    CARDIOVASCULAR: S1, S2 normal. No murmurs, rubs, or gallops.  ABDOMEN: Soft, non-tender, non-distended. Bowel sounds present. No organomegaly or mass.  EXTREMITIES: No pedal edema, cyanosis, or clubbing.  NEUROLOGIC: Cranial nerves II through XII are intact. Muscle strength 5/5 in all extremities. Sensation intact. Gait not checked.  PSYCHIATRIC: The patient is alert and oriented x 3.  SKIN: No obvious rash, lesion, or ulcer.   DATA REVIEW:   CBC  Recent Labs Lab 03/16/16 0422  WBC 7.7  HGB 12.7*  HCT 36.7*  PLT 253    Chemistries   Recent Labs Lab 03/12/16 0815  03/16/16 0422  NA 125*  < > 135  K 4.9  < > 3.7  CL 92*  < > 105  CO2 14*  < > 23  GLUCOSE 784*  < > 208*  BUN 23*  < > 19  CREATININE  1.10  < > 0.55*  CALCIUM 8.8*  < > 8.7*  AST 85*  --   --   ALT 123*  --   --   ALKPHOS 259*  --   --   BILITOT 1.4*  --   --   < > = values in this interval not displayed.  Cardiac Enzymes No results for input(s): TROPONINI in the last 168 hours.  Microbiology Results  Results for orders placed or performed during the hospital encounter of 03/12/16  MRSA PCR Screening     Status: None   Collection Time: 03/12/16 12:30 PM  Result Value Ref Range Status   MRSA by PCR NEGATIVE NEGATIVE Final    Comment:        The GeneXpert MRSA Assay (FDA approved for NASAL specimens only), is one component of a comprehensive MRSA colonization surveillance program. It is not intended to diagnose MRSA infection nor to guide or monitor treatment for MRSA infections.     RADIOLOGY:  No results found.   Management plans discussed with the patient, family and they are in agreement.  CODE STATUS:     Code Status Orders        Start     Ordered   03/12/16 1221  Full code  Continuous     03/12/16 1220    Code Status History    Date Active Date Inactive Code Status Order ID Comments User Context   02/18/2016  4:59 PM 02/19/2016  5:18 PM Full Code [id number]  Ramonita Lab,  MD ED   01/12/2016  8:58 AM 01/15/2016  7:33 PM Full Code [id number]  Milagros Loll, MD ED   01/11/2016 12:35 AM 01/11/2016 11:20 PM Full Code [id number]  Gery Pray, MD Inpatient   12/12/2015 10:04 PM 12/14/2015  6:38 PM Full Code [id number]  Katharina Caper, MD Inpatient   10/23/2015  5:42 PM 10/25/2015  7:04 PM Full Code [id number]  Katha Hamming, MD ED   06/10/2015  8:20 AM 06/12/2015  2:41 PM Full Code [id number]  Arnaldo Natal, MD Inpatient   06/06/2015  8:43 PM 06/07/2015  4:20 PM Full Code [id number]  Enedina Finner, MD Inpatient   04/07/2015 12:45 PM 04/08/2015  8:37 PM Full Code [id number]  Altamese Dilling, MD Inpatient   12/09/2014  2:44 PM 12/13/2014  4:16 PM Full Code [id number]  Gale Journey, MD Inpatient      TOTAL TIME TAKING CARE OF THIS PATIENT: 33 minutes.    Suad Autrey,  Mardi Mainland.D on 03/16/2016 at 9:29 AM  Between 7am to 6pm - Pager - [phone]  After 6pm go to [email or web address] - Scientist, research (life sciences) [place] [facility]  Office  [phone]  CC: Primary care physician; No PCP Per Patient

## 2016-03-16 NOTE — Care Management Note (Signed)
Case Management Note  Patient Details  Name: Trevor Brown MRN: [id number] Date of Birth: [birth year]  Subjective/Objective:   MATCH over-ride by Nicolasa DuckingSally Holland for the last time [facility]. This Clinical research associatewriter provided Trevor Brown with a cab voucher to the homeless shelter. This Clinical research associatewriter provided Trevor Brown with a MATCH letter and explained to him to take the letter with his prescriptions to a pharmacy on the list provided with the letter. G[facility] and to [facility].Provided Trevor Brown with a list of clinics which serve the uninsured and strongly encouraged him to make an appointment with one of the community resources provided to him. [facility] whether she noticed any cognitive or affective disturbances in Trevor Brown.                 Action/Plan:   Expected Discharge Date:                  Expected Discharge Plan:     In-House Referral:     Discharge planning Services  MATCH Program (Entered an override for MATCH-last time- he needs to establish care.)  Post Acute Care Choice:    Choice offered to:     DME Arranged:    DME Agency:     HH Arranged:    HH Agency:     Status of Service:     If discussed at [employer] Length of [employer] Meetings, dates discussed:    Additional Comments:  Ollen Rao A, RN 03/16/2016, 3:50 PM

## 2016-03-18 ENCOUNTER — Inpatient Hospital Stay
Admission: EM | Admit: 2016-03-18 | Discharge: 2016-03-21 | DRG: 639 | Disposition: A | Discharge status: Home / Self Care | Payer: Self-pay | Attending: Internal Medicine | Admitting: Internal Medicine

## 2016-03-18 ENCOUNTER — Inpatient Hospital Stay: Payer: Self-pay

## 2016-03-18 ENCOUNTER — Encounter: Payer: Self-pay | Admitting: Emergency Medicine

## 2016-03-18 DIAGNOSIS — R739 Hyperglycemia, unspecified: Secondary | ICD-10-CM

## 2016-03-18 DIAGNOSIS — Z9114 Patient's other noncompliance with medication regimen: Secondary | ICD-10-CM

## 2016-03-18 DIAGNOSIS — Z888 Allergy status to other drugs, medicaments and biological substances status: Secondary | ICD-10-CM

## 2016-03-18 DIAGNOSIS — Z880 Allergy status to penicillin: Secondary | ICD-10-CM

## 2016-03-18 DIAGNOSIS — E1010 Type 1 diabetes mellitus with ketoacidosis without coma: Principal | ICD-10-CM | POA: Diagnosis present

## 2016-03-18 DIAGNOSIS — Z833 Family history of diabetes mellitus: Secondary | ICD-10-CM

## 2016-03-18 DIAGNOSIS — Z9119 Patient's noncompliance with other medical treatment and regimen: Secondary | ICD-10-CM

## 2016-03-18 DIAGNOSIS — Z794 Long term (current) use of insulin: Secondary | ICD-10-CM

## 2016-03-18 DIAGNOSIS — F17210 Nicotine dependence, cigarettes, uncomplicated: Secondary | ICD-10-CM | POA: Diagnosis present

## 2016-03-18 DIAGNOSIS — I252 Old myocardial infarction: Secondary | ICD-10-CM

## 2016-03-18 DIAGNOSIS — E860 Dehydration: Secondary | ICD-10-CM

## 2016-03-18 DIAGNOSIS — Z79899 Other long term (current) drug therapy: Secondary | ICD-10-CM

## 2016-03-18 DIAGNOSIS — E1110 Type 2 diabetes mellitus with ketoacidosis without coma: Secondary | ICD-10-CM | POA: Diagnosis present

## 2016-03-18 DIAGNOSIS — J039 Acute tonsillitis, unspecified: Secondary | ICD-10-CM | POA: Diagnosis present

## 2016-03-18 DIAGNOSIS — J36 Peritonsillar abscess: Secondary | ICD-10-CM

## 2016-03-18 LAB — BASIC METABOLIC PANEL
Anion gap: 18 — ABNORMAL HIGH (ref 5–15)
Anion gap: 9 (ref 5–15)
Anion gap: 8 (ref 5–15)
Anion gap: 5 (ref 5–15)
BUN: 42 mg/dL — ABNORMAL HIGH (ref 6–20)
BUN: 26 mg/dL — ABNORMAL HIGH (ref 6–20)
BUN: 24 mg/dL — ABNORMAL HIGH (ref 6–20)
BUN: 22 mg/dL — ABNORMAL HIGH (ref 6–20)
CO2: 21 mmol/L — ABNORMAL LOW (ref 22–32)
CO2: 24 mmol/L (ref 22–32)
CO2: 26 mmol/L (ref 22–32)
CO2: 27 mmol/L (ref 22–32)
Calcium: 9.6 mg/dL (ref 8.9–10.3)
Calcium: 8.8 mg/dL — ABNORMAL LOW (ref 8.9–10.3)
Calcium: 8.9 mg/dL (ref 8.9–10.3)
Calcium: 8.8 mg/dL — ABNORMAL LOW (ref 8.9–10.3)
Chloride: 90 mmol/L — ABNORMAL LOW (ref 101–111)
Chloride: 98 mmol/L — ABNORMAL LOW (ref 101–111)
Chloride: 99 mmol/L — ABNORMAL LOW (ref 101–111)
Chloride: 101 mmol/L (ref 101–111)
Creatinine, Ser: 1.22 mg/dL (ref 0.61–1.24)
Creatinine, Ser: 0.71 mg/dL (ref 0.61–1.24)
Creatinine, Ser: 0.59 mg/dL — ABNORMAL LOW (ref 0.61–1.24)
Creatinine, Ser: 0.57 mg/dL — ABNORMAL LOW (ref 0.61–1.24)
GFR calc Af Amer: >60 mL/min (ref >60)
GFR calc Af Amer: >60 mL/min (ref >60)
GFR calc Af Amer: >60 mL/min (ref >60)
GFR calc Af Amer: >60 mL/min (ref >60)
GFR calc non Af Amer: >60 mL/min (ref >60)
GFR calc non Af Amer: >60 mL/min (ref >60)
GFR calc non Af Amer: >60 mL/min (ref >60)
GFR calc non Af Amer: >60 mL/min (ref >60)
Glucose, Bld: 540 mg/dL (ref 65–99)
Glucose, Bld: 165 mg/dL — ABNORMAL HIGH (ref 65–99)
Glucose, Bld: 134 mg/dL — ABNORMAL HIGH (ref 65–99)
Glucose, Bld: 62 mg/dL — ABNORMAL LOW (ref 65–99)
Potassium: 4.6 mmol/L (ref 3.5–5.1)
Potassium: 3.7 mmol/L (ref 3.5–5.1)
Potassium: 4 mmol/L (ref 3.5–5.1)
Potassium: 3.6 mmol/L (ref 3.5–5.1)
Sodium: 129 mmol/L — ABNORMAL LOW (ref 135–145)
Sodium: 131 mmol/L — ABNORMAL LOW (ref 135–145)
Sodium: 133 mmol/L — ABNORMAL LOW (ref 135–145)
Sodium: 133 mmol/L — ABNORMAL LOW (ref 135–145)

## 2016-03-18 LAB — CBC
HCT: 39.3 % — ABNORMAL LOW (ref 40.0–52.0)
Hemoglobin: 13.2 g/dL (ref 13.0–18.0)
MCH: 29.5 pg (ref 26.0–34.0)
MCHC: 33.5 g/dL (ref 32.0–36.0)
MCV: 88.2 fL (ref 80.0–100.0)
Platelets: 301 10*3/uL (ref 150–440)
RBC: 4.46 MIL/uL (ref 4.40–5.90)
RDW: 14.9 % — ABNORMAL HIGH (ref 11.5–14.5)
WBC: 16.1 10*3/uL — ABNORMAL HIGH (ref 3.8–10.6)

## 2016-03-18 LAB — GLUCOSE, CAPILLARY
Glucose-Capillary: 123 mg/dL — ABNORMAL HIGH (ref 65–99)
Glucose-Capillary: 510 mg/dL (ref 65–99)
Glucose-Capillary: 486 mg/dL — ABNORMAL HIGH (ref 65–99)
Glucose-Capillary: 300 mg/dL — ABNORMAL HIGH (ref 65–99)
Glucose-Capillary: 211 mg/dL — ABNORMAL HIGH (ref 65–99)
Glucose-Capillary: 238 mg/dL — ABNORMAL HIGH (ref 65–99)
Glucose-Capillary: 190 mg/dL — ABNORMAL HIGH (ref 65–99)
Glucose-Capillary: 163 mg/dL — ABNORMAL HIGH (ref 65–99)
Glucose-Capillary: 142 mg/dL — ABNORMAL HIGH (ref 65–99)
Glucose-Capillary: 123 mg/dL — ABNORMAL HIGH (ref 65–99)
Glucose-Capillary: 177 mg/dL — ABNORMAL HIGH (ref 65–99)
Glucose-Capillary: 198 mg/dL — ABNORMAL HIGH (ref 65–99)
Glucose-Capillary: 133 mg/dL — ABNORMAL HIGH (ref 65–99)
Glucose-Capillary: 73 mg/dL (ref 65–99)
Glucose-Capillary: 220 mg/dL — ABNORMAL HIGH (ref 65–99)

## 2016-03-18 LAB — URINALYSIS COMPLETE WITH MICROSCOPIC (ARMC ONLY)
Bacteria, UA: NONE SEEN
Bilirubin Urine: NEGATIVE
Glucose, UA: >500 mg/dL — AB
Hgb urine dipstick: NEGATIVE
Leukocytes, UA: NEGATIVE
Nitrite: NEGATIVE
Protein, ur: NEGATIVE mg/dL
RBC / HPF: NONE SEEN RBC/hpf (ref 0–5)
Specific Gravity, Urine: 1.028 (ref 1.005–1.030)
Squamous Epithelial / LPF: NONE SEEN
pH: 5 (ref 5.0–8.0)

## 2016-03-18 LAB — BLOOD GAS, VENOUS
Acid-base deficit: 11.7 mmol/L — ABNORMAL HIGH (ref 0.0–2.0)
Acid-base deficit: 2.4 mmol/L — ABNORMAL HIGH (ref 0.0–2.0)
Bicarbonate: 13.8 mEq/L — ABNORMAL LOW (ref 21.0–28.0)
Bicarbonate: 23.7 mEq/L (ref 21.0–28.0)
FIO2: 0.21
O2 Saturation: 55.4 %
O2 Saturation: 54.1 %
Patient temperature: 37
Patient temperature: 37
pCO2, Ven: 30 mmHg — ABNORMAL LOW (ref 44.0–60.0)
pCO2, Ven: 45 mmHg (ref 44.0–60.0)
pH, Ven: 7.27 — ABNORMAL LOW (ref 7.320–7.430)
pH, Ven: 7.33 (ref 7.320–7.430)
pO2, Ven: 34 mmHg (ref 31.0–45.0)
pO2, Ven: 31 mmHg (ref 31.0–45.0)

## 2016-03-18 MED ORDER — ONDANSETRON HCL 4 MG/2ML IJ SOLN
4.0000 mg | Freq: Four times a day (QID) | INTRAMUSCULAR | Status: DC | PRN
  Administered 2016-03-19 – 2016-03-20 (×3): 4 mg via INTRAVENOUS
  Filled 2016-03-18 (×3): qty 2

## 2016-03-18 MED ORDER — ACETAMINOPHEN 650 MG RE SUPP: 650.0000 mg | Freq: Four times a day (QID) | RECTAL | Status: DC | PRN

## 2016-03-18 MED ORDER — IOPAMIDOL (ISOVUE-370) INJECTION 76%
75.0000 mL | Freq: Once | INTRAVENOUS | Status: AC | PRN
  Administered 2016-03-18: 75 mL via INTRAVENOUS

## 2016-03-18 MED ORDER — INSULIN ASPART PROT & ASPART (70-30 MIX) 100 UNIT/ML ~~LOC~~ SUSP
30.0000 [IU] | Freq: Two times a day (BID) | SUBCUTANEOUS | Status: DC
  Administered 2016-03-18: 30 [IU] via SUBCUTANEOUS
  Filled 2016-03-18: qty 30

## 2016-03-18 MED ORDER — OXYCODONE HCL 5 MG PO TABS
5.0000 mg | ORAL_TABLET | ORAL | Status: DC | PRN
  Administered 2016-03-18 – 2016-03-21 (×17): 5 mg via ORAL
  Filled 2016-03-18 (×17): qty 1

## 2016-03-18 MED ORDER — DEXTROSE-NACL 5-0.45 % IV SOLN
INTRAVENOUS | Status: DC
  Administered 2016-03-18: 13:00:00 via INTRAVENOUS

## 2016-03-18 MED ORDER — ONDANSETRON HCL 4 MG/2ML IJ SOLN
INTRAMUSCULAR | Status: AC
  Administered 2016-03-18: 4 mg via INTRAVENOUS
  Filled 2016-03-18: qty 2

## 2016-03-18 MED ORDER — ACETAMINOPHEN 325 MG PO TABS
650.0000 mg | ORAL_TABLET | Freq: Four times a day (QID) | ORAL | Status: DC | PRN
Start: 2016-03-18 — End: 2016-03-21

## 2016-03-18 MED ORDER — DEXTROSE-NACL 5-0.45 % IV SOLN
INTRAVENOUS | Status: DC
  Administered 2016-03-18: 11:00:00 via INTRAVENOUS

## 2016-03-18 MED ORDER — INSULIN ASPART 100 UNIT/ML ~~LOC~~ SOLN
0.0000 [IU] | Freq: Three times a day (TID) | SUBCUTANEOUS | Status: DC
  Administered 2016-03-18: 3 [IU] via SUBCUTANEOUS
  Administered 2016-03-19: 9 [IU] via SUBCUTANEOUS
  Administered 2016-03-19: 3 [IU] via SUBCUTANEOUS
  Administered 2016-03-19: 9 [IU] via SUBCUTANEOUS
  Filled 2016-03-18 (×2): qty 3
  Filled 2016-03-18 (×2): qty 9

## 2016-03-18 MED ORDER — SODIUM CHLORIDE 0.9 % IV BOLUS (SEPSIS)
1000.0000 mL | Freq: Once | INTRAVENOUS | Status: AC
  Administered 2016-03-18: 1000 mL via INTRAVENOUS

## 2016-03-18 MED ORDER — ORAL CARE MOUTH RINSE: 15.0000 mL | Freq: Two times a day (BID) | OROMUCOSAL | Status: DC

## 2016-03-18 MED ORDER — CHLORHEXIDINE GLUCONATE 0.12 % MT SOLN
15.0000 mL | Freq: Two times a day (BID) | OROMUCOSAL | Status: DC
  Administered 2016-03-19 – 2016-03-21 (×5): 15 mL via OROMUCOSAL
  Filled 2016-03-18 (×5): qty 15

## 2016-03-18 MED ORDER — SODIUM CHLORIDE 0.9 % IV SOLN: INTRAVENOUS | Status: DC

## 2016-03-18 MED ORDER — POTASSIUM CHLORIDE 10 MEQ/100ML IV SOLN
10.0000 meq | INTRAVENOUS | Status: AC
  Administered 2016-03-18 (×2): 10 meq via INTRAVENOUS
  Filled 2016-03-18 (×2): qty 100

## 2016-03-18 MED ORDER — CLINDAMYCIN HCL 150 MG PO CAPS
450.0000 mg | ORAL_CAPSULE | Freq: Four times a day (QID) | ORAL | Status: DC
  Administered 2016-03-18 – 2016-03-21 (×13): 450 mg via ORAL
  Filled 2016-03-18 (×13): qty 3

## 2016-03-18 MED ORDER — ONDANSETRON HCL 4 MG PO TABS: 4.0000 mg | ORAL_TABLET | Freq: Four times a day (QID) | ORAL | Status: DC | PRN

## 2016-03-18 MED ORDER — SODIUM CHLORIDE 0.9 % IV SOLN
INTRAVENOUS | Status: DC
  Administered 2016-03-18: 1.5 [IU]/h via INTRAVENOUS
  Administered 2016-03-18: 2.4 [IU]/h via INTRAVENOUS
  Administered 2016-03-18: 4.3 [IU]/h via INTRAVENOUS
  Administered 2016-03-18: 3.6 [IU]/h via INTRAVENOUS
  Filled 2016-03-18: qty 2.5

## 2016-03-18 MED ORDER — ENOXAPARIN SODIUM 40 MG/0.4ML ~~LOC~~ SOLN
40.0000 mg | SUBCUTANEOUS | Status: DC
  Administered 2016-03-18 – 2016-03-20 (×3): 40 mg via SUBCUTANEOUS
  Filled 2016-03-18 (×3): qty 0.4

## 2016-03-18 MED ORDER — ONDANSETRON HCL 4 MG/2ML IJ SOLN
4.0000 mg | Freq: Once | INTRAMUSCULAR | Status: AC
  Administered 2016-03-18: 4 mg via INTRAVENOUS

## 2016-03-18 MED ORDER — SODIUM CHLORIDE 0.9 % IV SOLN
INTRAVENOUS | Status: DC
  Filled 2016-03-18: qty 2.5

## 2016-03-18 NOTE — ED Provider Notes (Signed)
[facility]-EMERGENCY DEPARTMENT Provider Note   CSN: [id number] Arrival date & time: 03/18/16  [id number]     History   Chief Complaint Chief Complaint  Patient presents with  . Hyperglycemia  . Nausea  . Emesis    HPI Trevor Brown is a 25 y.o. male hx of DM with recent admission for DKA, here with hyperglycemia, vomiting. He was admitted and discharged 2 days ago. He is currently living in a shelter and has not been the most compliant with his medicines. States that he was unable keep anything down since yesterday. He has been using his insulin, Novolin 70/30 last use was yesterday. Not take any insulin this morning. Has not been able to tolerate anything. Denies any fevers or chills.   The history is provided by the patient.    Past Medical History:  Diagnosis Date  . Diabetes mellitus without complication (HCC)   . Heart attack (HCC)    Pt claims he had heart attack a year ago ( 2015)- and was [facility], but not given any meds or angiogram, on review of chart- I could not find any details like that.  . Hepatitis C, acute may 2016    Patient Active Problem List   Diagnosis Date Noted  . 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 Anemia 01/13/2016  . Leukocytosis 01/13/2016  . Nausea & vomiting 01/10/2016  . Tobacco abuse 01/10/2016  . Hyponatremia 12/12/2015  . Type 1 diabetes mellitus with hyperglycemia (HCC) 12/12/2015  . Chest pain 12/12/2015  . Elevated troponin 12/12/2015  . Cocaine abuse 12/12/2015  . DKA (diabetic ketoacidoses) (HCC) 06/10/2015  . Hidradenitis suppurativa of left axilla   . DKA, type 1 (HCC) 06/06/2015  . Malnutrition of moderate degree (HCC) 04/08/2015  . Hepatitis C 12/13/2014  . Diabetes type 1, uncontrolled (HCC) 12/11/2014  . Major depressive disorder, single episode, mild (HCC)   . Major depression, single episode 12/10/2014  . Diabetes mellitus type 1, uncontrolled (HCC) 12/09/2014  . Transaminitis 12/09/2014  . Folliculitis 12/09/2014    . Homelessness 12/09/2014    History reviewed. No pertinent surgical history.     Home Medications    Prior to Admission medications   Medication Sig Start Date End Date Taking? Authorizing Provider  clindamycin (CLEOCIN) 150 MG capsule Take 3 capsules (450 mg total) by mouth 4 (four) times daily. 03/16/16 03/26/16  Wyatt Hasteavid K Hower, MD  insulin aspart (NOVOLOG) 100 UNIT/ML injection 0-9 Units, Subcutaneous, 3 times daily before meals & bedtime, First dose on Fri 03/15/16 at 2200 Correction coverage: Sensitive (thin, NPO, renal) CBG < 70: implement hypoglycemia protocol CBG 70 - 120: 0 units CBG 121 - 150: 1 unit CBG 151 - 200: 2 units CBG 201 - 250: 3 units CBG 251 - 300: 5 units CBG 301 - 350: 7 units CBG 351 - 400: 9 units 03/16/16   Wyatt Hasteavid K Hower, MD  insulin aspart protamine- aspart (NOVOLOG MIX 70/30) (70-30) 100 UNIT/ML injection Inject 0.3 mLs (30 Units total) into the skin 2 (two) times daily with a meal. 03/16/16   Wyatt Hasteavid K Hower, MD  oxyCODONE-acetaminophen (PERCOCET/ROXICET) 5-325 MG tablet Take 1 tablet by mouth every 6 (six) hours as needed for severe pain. 03/16/16 03/31/16  Wyatt Hasteavid K Hower, MD    Family History Family History  Problem Relation Age of Onset  . Cirrhosis Mother   . Diabetes Mellitus II Maternal Grandmother     Social History Social History  Substance Use Topics  . Smoking status:  Current Every Day Smoker    Packs/day: 1.50    Types: Cigarettes  . Smokeless tobacco: Never Used  . Alcohol use No     Allergies   Bee venom; Penicillins; Ibuprofen; Tramadol; and Vancomycin   Review of Systems Review of Systems  Gastrointestinal: Positive for vomiting.  All other systems reviewed and are negative.    Physical Exam Updated Vital Signs BP 131/79 (BP Location: Right Arm)   Pulse 98   Temp 97.9 F (36.6 C) (Oral)   Resp 17   Ht 5\' 4"  (1.626 m)   Wt 119 lb (54 kg)   SpO2 97%   BMI 20.43 kg/m   Physical Exam  Constitutional: He is oriented  to person, place, and time.  Dehydrated   HENT:  Head: Normocephalic.  MM dry   Eyes: EOM are normal. Pupils are equal, round, and reactive to light.  Neck: Normal range of motion. Neck supple.  Cardiovascular: Normal rate, regular rhythm and normal heart sounds.   Pulmonary/Chest: Effort normal and breath sounds normal. No respiratory distress. He has no wheezes.  Abdominal: Soft. Bowel sounds are normal. He exhibits no distension. There is no tenderness. There is no guarding.  Musculoskeletal: Normal range of motion.  Neurological: He is alert and oriented to person, place, and time.  Skin: Skin is warm.  Psychiatric: He has a normal mood and affect.  Nursing note and vitals reviewed.    ED Treatments / Results  Labs (all labs ordered are listed, but only abnormal results are displayed) Labs Reviewed  BASIC METABOLIC PANEL - Abnormal; Notable for the following:       Result Value   Sodium 129 (*)    Chloride 90 (*)    CO2 21 (*)    Glucose, Bld 540 (*)    BUN 42 (*)    Anion gap 18 (*)    All other components within normal limits  CBC - Abnormal; Notable for the following:    WBC 16.1 (*)    HCT 39.3 (*)    RDW 14.9 (*)    All other components within normal limits  GLUCOSE, CAPILLARY - Abnormal; Notable for the following:    Glucose-Capillary 510 (*)    All other components within normal limits  BLOOD GAS, VENOUS - Abnormal; Notable for the following:    Acid-base deficit 2.4 (*)    All other components within normal limits  URINALYSIS COMPLETEWITH MICROSCOPIC ([facility] ONLY)  CBG MONITORING, ED    EKG  EKG Interpretation None       Radiology No results found.  Procedures Procedures (including critical care time)  CRITICAL CARE Performed by: Richardean Canal   Total critical care time: 30 minutes  Critical care time was exclusive of separately billable procedures and treating other patients.  Critical care was necessary to treat or prevent imminent or  life-threatening deterioration.  Critical care was time spent personally by me on the following activities: development of treatment plan with patient and/or surrogate as well as nursing, discussions with consultants, evaluation of patient's response to treatment, examination of patient, obtaining history from patient or surrogate, ordering and performing treatments and interventions, ordering and review of laboratory studies, ordering and review of radiographic studies, pulse oximetry and re-evaluation of patient's condition.   Medications Ordered in ED Medications  dextrose 5 %-0.45 % sodium chloride infusion (not administered)  insulin regular (NOVOLIN R,HUMULIN R) 250 Units in sodium chloride 0.9 % 250 mL (1 Units/mL) infusion (not administered)  sodium chloride 0.9 % bolus 1,000 mL (0 mLs Intravenous Stopped 03/18/16 0804)  ondansetron (ZOFRAN) injection 4 mg (4 mg Intravenous Given 03/18/16 0700)  sodium chloride 0.9 % bolus 1,000 mL (1,000 mLs Intravenous New Bag/Given 03/18/16 0731)     Initial Impression / Assessment and Plan / ED Course  I have reviewed the triage vital signs and the nursing notes.  Pertinent labs & imaging results that were available during my care of the patient were reviewed by me and considered in my medical decision making (see chart for details).  Clinical Course    NEKO BOYAJIAN is a 24 y.o. male here with vomiting, dehydration. Recent admission for DKA. CBG was 596 on arrival. Concerned for DKA. Will get labs, CBG. Will give IVF and insulin.   8:05 AM Labs showed glucose 540 with Na 129 and AG 18. WBC 16. VBG reassuring. Still not tolerating PO. Wonder if he is dehydration vs in early DKA. Started on glucostabilizer and will admit.   Final Clinical Impressions(s) / ED Diagnoses   Final diagnoses:  None    New Prescriptions New Prescriptions   No medications on file     Charlynne Pander, MD 03/18/16 [phone]

## 2016-03-18 NOTE — Progress Notes (Signed)
Inpatient Diabetes Program Recommendations  AACE/ADA: New Consensus Statement on Inpatient Glycemic Control (2015)  Target Ranges:  Prepandial:   less than 140 mg/dL      Peak postprandial:   less than 180 mg/dL (1-2 hours)      Critically ill patients:  140 - 180 mg/dL   Results for Trevor Brown, Trevor Brown (MRN [id number]) as of 03/18/2016 08:51  Ref. Range 03/18/2016 06:46  Sodium Latest Ref Range: 135 - 145 mmol/L 129 (L)  Potassium Latest Ref Range: 3.5 - 5.1 mmol/L 4.6  Chloride Latest Ref Range: 101 - 111 mmol/L 90 (L)  CO2 Latest Ref Range: 22 - 32 mmol/L 21 (L)  BUN Latest Ref Range: 6 - 20 mg/dL 42 (H)  Creatinine Latest Ref Range: 0.61 - 1.24 mg/dL 1.22  Calcium Latest Ref Range: 8.9 - 10.3 mg/dL 9.6  EGFR (Non-African Amer.) Latest Ref Range: >60 mL/min >60  EGFR (African American) Latest Ref Range: >60 mL/min >60  Glucose Latest Ref Range: 65 - 99 mg/dL 540 (HH)  Anion gap Latest Ref Range: 5 - 15  18 (H)    Admit with: DKA  History: Type 1 DM  Home DM Meds: 70/30 Insulin: 30 units bidwc       Novolog SSI  Current Insulin Orders: IV Insulin drip       -IV Insulin drip started at 8am today per DKA order set.  -Patient well known to the Inpatient Glycemic Control Team.  Frequent admissions for DKA.    -Patient has been counseled about the importance of good glucose control at home multiple times.  This is patient's 10th admission for DKA since May of 2016!     MD- When patient is ready to transition off IV Insulin drip (Anion Gap <12, CO2 >20) please make sure patient receives his home dose of 70/30 insulin 1-2 hours before IV Insulin drip stopped.      --Will follow patient during hospitalization--  Wyn Quaker RN, MSN, CDE Diabetes Coordinator Inpatient Glycemic Control Team Team Pager: [phone] (8a-5p)

## 2016-03-18 NOTE — ED Notes (Signed)
Recalled icu - no answer

## 2016-03-18 NOTE — H&P (Signed)
[facility] [place] [facility]   PATIENT NAME: Trevor Brown    MR#:  [id number]  DATE OF BIRTH:  08/06/1990   DATE OF ADMISSION:  03/18/2016  PRIMARY CARE PHYSICIAN: No PCP Per Patient   REQUESTING/REFERRING PHYSICIAN: yao  CHIEF COMPLAINT:   Chief Complaint  Patient presents with  . Hyperglycemia  . Nausea  . Emesis    HISTORY OF PRESENT ILLNESS:  Trevor Brown  is a 25 y.o. male with a known history of Type 1 diabetes, noncompliance with insulin presenting with weakness nausea. Patient discharged by myself about to 3 days ago after episode of DKA he states he has not been compliant with his insulin.He complains of feeling weakness, tired, elevated blood sugar. Emergency department meeting DKA criteria started on insulin drip.  PAST MEDICAL HISTORY:   Past Medical History:  Diagnosis Date  . Diabetes mellitus without complication (HCC)   . Heart attack (HCC)    Pt claims he had heart attack a year ago ( 2015)- and [facility], but not given any meds or angiogram, on review of chart- I could not find any details like that.  . Hepatitis C, acute may 2016    PAST SURGICAL HISTORY:  History reviewed. No pertinent surgical history.  SOCIAL HISTORY:   Social History  Substance Use Topics  . Smoking status: Current Every Day Smoker    Packs/day: 1.50    Types: Cigarettes  . Smokeless tobacco: Never Used  . Alcohol use No    FAMILY HISTORY:   Family History  Problem Relation Age of Onset  . Cirrhosis Mother   . Diabetes Mellitus II Maternal Grandmother     DRUG ALLERGIES:   Allergies  Allergen Reactions  . Bee Venom Anaphylaxis  . Penicillins Anaphylaxis, Hives and Other (See Comments)    Has patient had a PCN reaction causing immediate rash, facial/tongue/throat swelling, SOB or lightheadedness with hypotension: Yes Has patient had a PCN reaction causing severe rash involving mucus membranes or skin necrosis: No Has  patient had a PCN reaction that required hospitalization No Has patient had a PCN reaction occurring within the last 10 years: Yes If all of the above answers are "NO", then may proceed with Cephalosporin use.  . Ibuprofen Other (See Comments)    Reaction:  GI upset   . Tramadol Hives and Swelling  . Vancomycin Rash and Other (See Comments)    Reaction:  Red man's syndrome     REVIEW OF SYSTEMS:  REVIEW OF SYSTEMS:  CONSTITUTIONAL: Denies fevers, chills, positive fatigue, weakness.  EYES: Denies blurred vision, double vision, or eye pain.  EARS, NOSE, THROAT: Denies tinnitus, ear pain, hearing loss.  RESPIRATORY: denies cough, shortness of breath, wheezing  CARDIOVASCULAR: Denies chest pain, palpitations, edema.  GASTROINTESTINAL: Denies nausea, vomiting, diarrhea, abdominal pain.  GENITOURINARY: Denies dysuria, hematuria.  ENDOCRINE: Denies nocturia or thyroid problems. HEMATOLOGIC AND LYMPHATIC: Denies easy bruising or bleeding.  SKIN: Denies rash or lesions.  MUSCULOSKELETAL: Denies pain in neck, back, shoulder, knees, hips, or further arthritic symptoms.  NEUROLOGIC: Denies paralysis, paresthesias.  PSYCHIATRIC: Denies anxiety or depressive symptoms. Otherwise full review of systems performed by me is negative.   MEDICATIONS AT HOME:   Prior to Admission medications   Medication Sig Start Date End Date Taking? Authorizing Provider  clindamycin (CLEOCIN) 150 MG capsule Take 3 capsules (450 mg total) by mouth 4 (four) times daily. 03/16/16 03/26/16  Wyatt Hasteavid K Kynslee Baham, MD  insulin aspart (  NOVOLOG) 100 UNIT/ML injection 0-9 Units, Subcutaneous, 3 times daily before meals & bedtime, First dose on Fri 03/15/16 at 2200 Correction coverage: Sensitive (thin, NPO, renal) CBG < 70: implement hypoglycemia protocol CBG 70 - 120: 0 units CBG 121 - 150: 1 unit CBG 151 - 200: 2 units CBG 201 - 250: 3 units CBG 251 - 300: 5 units CBG 301 - 350: 7 units CBG 351 - 400: 9 units 03/16/16   Wyatt Haste, MD  insulin aspart protamine- aspart (NOVOLOG MIX 70/30) (70-30) 100 UNIT/ML injection Inject 0.3 mLs (30 Units total) into the skin 2 (two) times daily with a meal. 03/16/16   Wyatt Haste, MD  oxyCODONE-acetaminophen (PERCOCET/ROXICET) 5-325 MG tablet Take 1 tablet by mouth every 6 (six) hours as needed for severe pain. 03/16/16 03/31/16  Wyatt Haste, MD      VITAL SIGNS:  Blood pressure 131/79, pulse 98, temperature 97.9 F (36.6 C), temperature source Oral, resp. rate 17, height 5\' 4"  (1.626 m), weight 54 kg (119 lb), SpO2 97 %.  PHYSICAL EXAMINATION:  VITAL SIGNS: Vitals:   03/18/16 0639  BP: 131/79  Pulse: 98  Resp: 17  Temp: 97.9 F (36.6 C)   GENERAL:25 y.o.male currently in no acute distress.  HEAD: Normocephalic, atraumatic.  EYES: Pupils equal, round, reactive to light. Extraocular muscles intact. No scleral icterus.  MOUTH: Dry mucosal membrane. Dentition poor. No abscess noted.  EAR, NOSE, THROAT: Clear without exudates. No external lesions.  NECK: Supple. No thyromegaly. No nodules. No JVD.  PULMONARY: Clear to ascultation, without wheeze rails or rhonci. No use of accessory muscles, Good respiratory effort. good air entry bilaterally CHEST: Nontender to palpation.  CARDIOVASCULAR: S1 and S2. Regular rate and rhythm. No murmurs, rubs, or gallops. No edema. Pedal pulses 2+ bilaterally.  GASTROINTESTINAL: Soft, nontender, nondistended. No masses. Positive bowel sounds. No hepatosplenomegaly.  MUSCULOSKELETAL: No swelling, clubbing, or edema. Range of motion full in all extremities.  NEUROLOGIC: Cranial nerves II through XII are intact. No gross focal neurological deficits. Sensation intact. Reflexes intact.  SKIN: No ulceration, lesions, rashes, or cyanosis. Skin warm and dry. Turgor intact.  PSYCHIATRIC: Mood, affect within normal limits. The patient is awake, alert and oriented x 3. Insight, judgment intact.    LABORATORY PANEL:   CBC  Recent Labs Lab  03/18/16 0646  WBC 16.1*  HGB 13.2  HCT 39.3*  PLT 301   ------------------------------------------------------------------------------------------------------------------  Chemistries   Recent Labs Lab 03/12/16 0815  03/18/16 0646  NA 125*  < > 129*  K 4.9  < > 4.6  CL 92*  < > 90*  CO2 14*  < > 21*  GLUCOSE 784*  < > 540*  BUN 23*  < > 42*  CREATININE 1.10  < > 1.22  CALCIUM 8.8*  < > 9.6  AST 85*  --   --   ALT 123*  --   --   ALKPHOS 259*  --   --   BILITOT 1.4*  --   --   < > = values in this interval not displayed. ------------------------------------------------------------------------------------------------------------------  Cardiac Enzymes No results for input(s): TROPONINI in the last 168 hours. ------------------------------------------------------------------------------------------------------------------  RADIOLOGY:  No results found.  EKG:   Orders placed or performed during the hospital encounter of 03/12/16  . EKG 12-Lead  . EKG 12-Lead  . EKG    IMPRESSION AND PLAN:   25 year old Caucasian gentleman history of type 1 diabetes and noncompliance presenting in DKA  1.  DKA: IV fluids, insulin, Accu-Cheks, BMP, potassium replacement 2. Tonsillar abscess: Continue clindamycin, check CT to assess status     All the records are reviewed and case discussed with ED provider. Management plans discussed with the patient, family and they are in agreement.  CODE STATUS: Full  TOTAL TIME TAKING CARE OF THIS PATIENT: 40 critical minutes.    Alexea Blase,  Mardi Mainland.D on 03/18/2016 at 8:21 AM  Between 7am to 6pm - Pager - [phone]  After 6pm: House Pager: - [phone]  [facility]  Office  [phone]  CC: Primary care physician; No PCP Per Patient

## 2016-03-18 NOTE — ED Notes (Signed)
Called report but rn was getting another pt at the same time. Will call.

## 2016-03-18 NOTE — Progress Notes (Signed)
Pt has remained alert and oriented with c/o teeth pain 9/10. Lung sounds clear to auscultation on RA. NSR on cardiac monitor.  Pt is currently NPO on an insulin gtt.  Pt found eating cheese crackers in room. Pt stated that he hasn't ate anything in 4 days. Pt educated on the importance of glycemic control-especially while on insulin gtt.

## 2016-03-18 NOTE — Care Management (Addendum)
[facility] to better manage patient's blood sugar in outpatient setting. I have sent referral pending their ability to follow this case for nursing as patient is self-pay and staying at homeless shelter. [facility] 03/06/16 for his diabetes meds--not sure if he filled meds. He was also given a cab voucher back to homeless shelter. He has a new patient appointment 03/27/16 at 10:30A at [facility]. Although his diabetes is poorly controlled there are multiple factors involved- father brings in snacks for patient to eat, patient not following through with appointments arranged, patient not picking up medications related to diabetes management, arms are sore due to injections, etc. Despite the numerous encounters with diabetic nursing, nursing, and physician education patient is reluctant to follow through. Is this non compliance the result of a learning disability or simply that transportation and accessibility is not available to patient? Patient has a blank stare when this RNCM has tried to educate patient or do an assessment. He frequently complains of generalized pain. He states he can read and write when asked. He shows little emotion when I've asked him about his living status or his medical status. He says his father is supportive. He was caught smoking in the hospital room on last admission (1A).

## 2016-03-18 NOTE — ED Triage Notes (Signed)
Pt arrived via EMS from the homeless shelter c/o hyperglycemia as well as N/V. Pt was discharged two days ago from this facility and reports that he has been having nausea and vomiting and has been unable to keep anything down, so has not been taking his insulin. Pt's FSBS for EMS was 596.

## 2016-03-19 LAB — GLUCOSE, CAPILLARY
Glucose-Capillary: 363 mg/dL — ABNORMAL HIGH (ref 65–99)
Glucose-Capillary: 209 mg/dL — ABNORMAL HIGH (ref 65–99)
Glucose-Capillary: 392 mg/dL — ABNORMAL HIGH (ref 65–99)
Glucose-Capillary: 228 mg/dL — ABNORMAL HIGH (ref 65–99)

## 2016-03-19 MED ORDER — INSULIN ASPART 100 UNIT/ML ~~LOC~~ SOLN
0.0000 [IU] | Freq: Three times a day (TID) | SUBCUTANEOUS | Status: DC
  Administered 2016-03-20: 13:00:00 2 [IU] via SUBCUTANEOUS
  Administered 2016-03-20: 1 [IU] via SUBCUTANEOUS
  Administered 2016-03-21: 9 [IU] via SUBCUTANEOUS
  Administered 2016-03-21: 17:00:00 3 [IU] via SUBCUTANEOUS
  Administered 2016-03-21: 5 [IU] via SUBCUTANEOUS
  Filled 2016-03-19: qty 2
  Filled 2016-03-19: qty 9
  Filled 2016-03-19: qty 5
  Filled 2016-03-19: qty 1
  Filled 2016-03-19: qty 12
  Filled 2016-03-19: qty 3

## 2016-03-19 MED ORDER — INSULIN ASPART 100 UNIT/ML ~~LOC~~ SOLN
5.0000 [IU] | Freq: Every day | SUBCUTANEOUS | Status: DC
  Administered 2016-03-19 – 2016-03-21 (×3): 5 [IU] via SUBCUTANEOUS
  Filled 2016-03-19 (×3): qty 5

## 2016-03-19 MED ORDER — INSULIN ASPART 100 UNIT/ML ~~LOC~~ SOLN
0.0000 [IU] | Freq: Every day | SUBCUTANEOUS | Status: DC
  Administered 2016-03-19: 22:00:00 2 [IU] via SUBCUTANEOUS
  Administered 2016-03-20: 4 [IU] via SUBCUTANEOUS
  Filled 2016-03-19: qty 2
  Filled 2016-03-19: qty 4

## 2016-03-19 MED ORDER — INSULIN ASPART PROT & ASPART (70-30 MIX) 100 UNIT/ML ~~LOC~~ SUSP
34.0000 [IU] | Freq: Two times a day (BID) | SUBCUTANEOUS | Status: DC
  Administered 2016-03-19 – 2016-03-21 (×4): 34 [IU] via SUBCUTANEOUS
  Filled 2016-03-19 (×4): qty 34

## 2016-03-19 NOTE — Progress Notes (Signed)
[facility] - [place] at [facility]   PATIENT NAME: Trevor Brown    MRN#:  [id number]  DATE OF BIRTH:  [birth year]  SUBJECTIVE:  Hospital Day: 1 day Trevor Brown is a 25 y.o. male presenting with Hyperglycemia; Nausea; and Emesis .   Overnight events: transitioned off insulin gtt Interval Events: no complaints  REVIEW OF SYSTEMS:  CONSTITUTIONAL: No fever, fatigue or weakness.  EYES: No blurred or double vision.  EARS, NOSE, AND THROAT: No tinnitus or ear pain.  RESPIRATORY: No cough, shortness of breath, wheezing or hemoptysis.  CARDIOVASCULAR: No chest pain, orthopnea, edema.  GASTROINTESTINAL: No nausea, vomiting, diarrhea or abdominal pain.  GENITOURINARY: No dysuria, hematuria.  ENDOCRINE: No polyuria, nocturia,  HEMATOLOGY: No anemia, easy bruising or bleeding SKIN: No rash or lesion. MUSCULOSKELETAL: No joint pain or arthritis.   NEUROLOGIC: No tingling, numbness, weakness.  PSYCHIATRY: No anxiety or depression.   DRUG ALLERGIES:   Allergies  Allergen Reactions  . Bee Venom Anaphylaxis  . Penicillins Anaphylaxis, Hives and Other (See Comments)    Has patient had a PCN reaction causing immediate rash, facial/tongue/throat swelling, SOB or lightheadedness with hypotension: Yes Has patient had a PCN reaction causing severe rash involving mucus membranes or skin necrosis: No Has patient had a PCN reaction that required hospitalization No Has patient had a PCN reaction occurring within the last 10 years: Yes If all of the above answers are "NO", then may proceed with Cephalosporin use.  . Ibuprofen Other (See Comments)    Reaction:  GI upset   . Tramadol Hives and Swelling  . Vancomycin Rash and Other (See Comments)    Reaction:  Red man's syndrome     VITALS:  Blood pressure 123/73, pulse 69, temperature 98.4 F (36.9 C), temperature source Oral, resp. rate 20, height 5\' 4"  (1.626 m), weight 54.6 kg (120 lb 4.8 oz), SpO2 98 %.  PHYSICAL  EXAMINATION:  VITAL SIGNS: Vitals:   03/19/16 0900 03/19/16 1048  BP: 105/72 123/73  Pulse: 71 69  Resp: (!) 21 20  Temp:  98.4 F (36.9 C)   GENERAL:25 y.o.male currently in no acute distress.  HEAD: Normocephalic, atraumatic.  EYES: Pupils equal, round, reactive to light. Extraocular muscles intact. No scleral icterus.  MOUTH: Moist mucosal membrane. Dentition poor. No abscess noted.  EAR, NOSE, THROAT: Clear without exudates. No external lesions.  NECK: Supple. No thyromegaly. No nodules. No JVD.  PULMONARY: Clear to ascultation, without wheeze rails or rhonci. No use of accessory muscles, Good respiratory effort. good air entry bilaterally CHEST: Nontender to palpation.  CARDIOVASCULAR: S1 and S2. Regular rate and rhythm. No murmurs, rubs, or gallops. No edema. Pedal pulses 2+ bilaterally.  GASTROINTESTINAL: Soft, nontender, nondistended. No masses. Positive bowel sounds. No hepatosplenomegaly.  MUSCULOSKELETAL: No swelling, clubbing, or edema. Range of motion full in all extremities.  NEUROLOGIC: Cranial nerves II through XII are intact. No gross focal neurological deficits. Sensation intact. Reflexes intact.  SKIN: No ulceration, lesions, rashes, or cyanosis. Skin warm and dry. Turgor intact.  PSYCHIATRIC: Mood, affect within normal limits. The patient is awake, alert and oriented x 3. Insight, judgment intact.      LABORATORY PANEL:   CBC  Recent Labs Lab 03/18/16 0646  WBC 16.1*  HGB 13.2  HCT 39.3*  PLT 301   ------------------------------------------------------------------------------------------------------------------  Chemistries   Recent Labs Lab 03/18/16 2112  NA 133*  K 3.6  CL 101  CO2 27  GLUCOSE 62*  BUN 22*  CREATININE 0.57*  CALCIUM 8.8*   ------------------------------------------------------------------------------------------------------------------  Cardiac Enzymes No results for input(s): TROPONINI in the last 168  hours. ------------------------------------------------------------------------------------------------------------------  RADIOLOGY:  Ct Soft Tissue Neck W Contrast  Result Date: 03/18/2016 CLINICAL DATA:  Sore throat, weakness, nausea, and vomiting for 2 days. Evaluation for tonsillar abscess. EXAM: CT NECK WITH CONTRAST TECHNIQUE: Multidetector CT imaging of the neck was performed using the standard protocol following the bolus administration of intravenous contrast. CONTRAST:  75 mL Isovue 370 COMPARISON:  04/05/2014 FINDINGS: Pharynx and larynx: Mild-to-moderate enlargement and slightly heterogeneous enhancement involving the palatine tonsils bilaterally, slightly more notable on the right. No tonsillar/peritonsillar abscess identified. No retropharyngeal fluid collection. Unremarkable larynx. Poor dentition with multiple periapical lucencies which have increased from the prior study but without evidence of adjacent soft tissue abscess. Salivary glands: The submandibular and parotid glands are unremarkable. Thyroid: Unremarkable. Lymph nodes: No enlarged lymph nodes in the neck. 8 mm subcutaneous nodule in the left face just below the zygomatic arch, unchanged and likely a sebaceous cyst. Vascular: Major vascular structures of the neck are patent. Limited intracranial: Unremarkable. Visualized orbits: Unremarkable. Mastoids and visualized paranasal sinuses: No significant inflammatory change. Skeleton: No acute abnormality. Upper chest: Clear lung apices. IMPRESSION: Bilateral tonsillar enlargement and heterogeneity which may reflect tonsillitis. No abscess. Electronically Signed   By: Sebastian AcheAllen  Grady M.D.   On: 03/18/2016 09:37    EKG:   Orders placed or performed during the hospital encounter of 03/12/16  . EKG 12-Lead  . EKG 12-Lead  . EKG    ASSESSMENT AND PLAN:   Trevor Brown is a 25 y.o. male presenting with Hyperglycemia; Nausea; and Emesis . Admitted 03/18/2016 : Day #: 1 day 1. Type 1  Diabetes: No longer in DKA continue with 70/30 and meal coverage.  2. Tonsillitis: Continue clindamycin for total 10 day course  Disposition: Patient homeless recently kicked out of, shelter social work to give information about placement with knowledge he has declined this previously  All the records are reviewed and case discussed with Care Management/Social Workerr. Management plans discussed with the patient, family and they are in agreement.  CODE STATUS: full TOTAL TIME TAKING CARE OF THIS PATIENT: 28 minutes.   POSSIBLE D/C IN 1DAYS, DEPENDING ON CLINICAL CONDITION.   Hower,  Mardi MainlandDavid K M.D on 03/19/2016 at 12:52 PM  Between 7am to 6pm - Pager - [phone]  After 6pm: House Pager: - [phone]  [facility]  Office  [phone]  CC: Primary care physician; No PCP Per Patient

## 2016-03-19 NOTE — Progress Notes (Signed)
Patient may walk outside and around in the hospital per Dr Clint GuyHower

## 2016-03-19 NOTE — Clinical Social Work Note (Signed)
Clinical Social Work Assessment  Patient Details  Name: Trevor Brown MRN: [id number] Date of Birth: [birth year]  Date of referral:  03/19/16               Reason for consult:  Discharge Planning                Permission sought to share information with:  Family Supports Permission granted to share information::  Yes, Verbal Permission Granted  Name::        Agency::     Relationship::   ([name]- Father)  Contact Information:     Housing/Transportation Living arrangements for the past 2 months:  Homeless Source of Information:  Patient Patient Interpreter Needed:  None Criminal Activity/Legal Involvement Pertinent to Current Situation/Hospitalization:  No - Comment as needed Significant Relationships:  Parents ([name]- Father) Lives with:  Parents ([name]- Father) Do you feel safe going back to the place where you live?  Yes Need for family participation in patient care:  No (Coment)  Care giving concerns:  Patient was recently discharged from [facility] back to [facility] [place] ([place]). Patient reports that he was "kicked out" of [facility].   Social Worker assessment / plan:  CSW and CSW Intern met with patient at bedside. CSW introduced herself and her role. Patient reports that he was living at [facility] recently but was kicked out. Reported he does not know why he was kicked out but he was kicked out of [facility] shortly after he was discharged from [facility]. CSW inquired if patient had any family or friends that he could stay with he reported no. CSW inquired if patient would be interested in going to the homeless shelter in [place], [place] patient responded "no". Patient inquired about going to a group home. CSW informed that due to him not having Medicaid he could not be placed at a group home unless he would pay about $1,500-2,000 a month out of pocket. Patient reported he could not afford this fee. Reported that his dad was kicked out of [facility] too. Stated his dad works. CSW inquired about his  dad possibly obtain a motel room. Patient said "maybe". Patient reported he does not know how much money his father brings in. Stated that he doesn't want to go to [place] because it's too far for his dad to travel to work. Reported his dad has been staying with him in the room. Patient reported that he does not have any other options.    CSW Intern provided patient with [employer]; [employer]; [employer] and encouraged patient to contact [facility] to determine if he could possibly return. Patient shook his head but did not respond verbally. CSW is signing off but is available if a need were to arise. Patient to follow up with [facility] to determine if he can return.   Employment status:  Unemployed Forensic scientist:  Self Pay (Medicaid Pending) PT Recommendations:  Not assessed at this time Information / Referral to community resources:  Other (Comment Required) [facility] [employer]; [facility]; Homeless Shelter List)  Patient/Family's Response to care:  Patient reported he did not have anywhere to go and accepted resources from [facility]. Stated he'll call to see if he can return to [facility].   Patient/Family's Understanding of and Emotional Response to Diagnosis, Current Treatment, and Prognosis:  Patient reports he understands his Diagnosis, Current Treatment, and Prognosis.   Emotional Assessment Appearance:  Appears stated age Attitude/Demeanor/Rapport:   (  None) Affect (typically observed):  Calm Orientation:  Oriented to Self, Oriented to Place, Oriented to  Time, Oriented to Situation Alcohol / Substance use:  Not Applicable Psych involvement (Current and /or in the community):  No (Comment)  Discharge Needs  Concerns to be addressed:  Discharge Planning Concerns Readmission within the last 30 days:  Yes Current discharge risk:  Chronically ill Barriers to Discharge:  Continued Medical Work up   [employer],  LCSW 03/19/2016, 1:58 PM

## 2016-03-19 NOTE — Progress Notes (Signed)
Inpatient Diabetes Program Recommendations  AACE/ADA: New Consensus Statement on Inpatient Glycemic Control (2015)  Target Ranges:  Prepandial:   less than 140 mg/dL      Peak postprandial:   less than 180 mg/dL (1-2 hours)      Critically ill patients:  140 - 180 mg/dL  Results for Trevor DarlingBAILEY, [name] (MRN [id number]) as of 03/19/2016 08:31  Ref. Range 03/18/2016 16:31 03/18/2016 17:30 03/18/2016 18:33 03/18/2016 19:35 03/18/2016 20:41 03/18/2016 23:17 03/19/2016 07:50  Glucose-Capillary Latest Ref Range: 65 - 99 mg/dL 409123 (H) 811177 (H) 914198 (H) 133 (H) 73 220 (H) 363 (H)    Review of Glycemic Control  Diabetes history: DM1 Outpatient Diabetes medications: 70/30 30 units BID, Novolog 0-9 units ACHS Current orders for Inpatient glycemic control: 70/30 30 units BID, Novolog 0-9 units ACHS  Inpatient Diabetes Program Recommendations: Insulin - Basal: Please consider increasing 70/30 to 34 units BID (which will provide 48 units for basal needs and 20 units for meal coverage per day). Correction (SSI): Please continue correction scale as ordered. Insulin - Meal Coverage: Please consider ordering Novolog 5 units with lunch for carbohydrates consumed at lunch time. Also, since patient has Type 1 diabetes he will require insulin coverage for any carbohydrates he consumes. Therefore, please consider ordering Novolog 0-6 units PRN for snacks (using 1 unit for every 10 grams of carbohydrates consumed).  Thanks, Orlando PennerMarie Owen Pagnotta, RN, MSN, CDE Diabetes Coordinator Inpatient Diabetes Program [phone] (Team Pager from 8am to 5pm) [phone] (AP office) [phone] [facility] office) [phone] [facility] office)

## 2016-03-20 LAB — GLUCOSE, CAPILLARY
Glucose-Capillary: 514 mg/dL (ref 65–99)
Glucose-Capillary: 305 mg/dL — ABNORMAL HIGH (ref 65–99)
Glucose-Capillary: 141 mg/dL — ABNORMAL HIGH (ref 65–99)
Glucose-Capillary: 190 mg/dL — ABNORMAL HIGH (ref 65–99)
Glucose-Capillary: 460 mg/dL — ABNORMAL HIGH (ref 65–99)
Glucose-Capillary: 475 mg/dL — ABNORMAL HIGH (ref 65–99)
Glucose-Capillary: 302 mg/dL — ABNORMAL HIGH (ref 65–99)

## 2016-03-20 LAB — GLUCOSE, RANDOM
Glucose, Bld: 408 mg/dL — ABNORMAL HIGH (ref 65–99)
Glucose, Bld: 493 mg/dL — ABNORMAL HIGH (ref 65–99)

## 2016-03-20 MED ORDER — INSULIN ASPART 100 UNIT/ML ~~LOC~~ SOLN
12.0000 [IU] | Freq: Once | SUBCUTANEOUS | Status: AC
  Administered 2016-03-20: 12 [IU] via SUBCUTANEOUS

## 2016-03-20 MED ORDER — MAGIC MOUTHWASH
5.0000 mL | Freq: Four times a day (QID) | ORAL | Status: DC | PRN
  Administered 2016-03-20: 5 mL via ORAL
  Filled 2016-03-20: qty 10

## 2016-03-20 MED ORDER — LIDOCAINE VISCOUS 2 % MT SOLN
5.0000 mL | Freq: Four times a day (QID) | OROMUCOSAL | Status: DC | PRN
  Filled 2016-03-20: qty 15

## 2016-03-20 MED ORDER — MAGIC MOUTHWASH W/LIDOCAINE
10.0000 mL | Freq: Four times a day (QID) | ORAL | Status: DC | PRN
  Filled 2016-03-20: qty 10

## 2016-03-20 MED ORDER — INSULIN ASPART 100 UNIT/ML ~~LOC~~ SOLN
20.0000 [IU] | Freq: Once | SUBCUTANEOUS | Status: AC
  Administered 2016-03-20: 20 [IU] via SUBCUTANEOUS
  Filled 2016-03-20: qty 20

## 2016-03-20 NOTE — Progress Notes (Addendum)
Inpatient Diabetes Program Recommendations  AACE/ADA: New Consensus Statement on Inpatient Glycemic Control (2015)  Target Ranges:  Prepandial:   less than 140 mg/dL      Peak postprandial:   less than 180 mg/dL (1-2 hours)      Critically ill patients:  140 - 180 mg/dL   Lab Results  Component Value Date   GLUCAP 141 (H) 03/20/2016   HGBA1C 13.4 (H) 02/19/2016    Review of Glycemic ControlResults for Trevor Brown, Kaleb B (MRN [id number]) as of 03/20/2016 09:29  Ref. Range 03/19/2016 16:43 03/19/2016 20:39 03/20/2016 04:59 03/20/2016 06:36 03/20/2016 08:17  Glucose-Capillary Latest Ref Range: 65 - 99 mg/dL 409392 (H) 811228 (H) 914514 (HH) 305 (H) 141 (H)    Diabetes history: Type 1 diabetes Outpatient Diabetes medications: Novolog 70/30 30 units bid Current orders for Inpatient glycemic control:  Novolog 70/30 -34 units bid, Novolog 5 units with lunch, Novolog sensitive correction tid with meals and HS  Inpatient Diabetes Program Recommendations:   Note that dose of 70/30 changed yesterday morning to 34 units bid.  According to documentation patient did not receive 70/30 yesterday morning due to dose change?  Blood sugars continue to rise >400 mg/dL even with administration of PM 70/30?  Very difficult patient to control even in the hospital.  Consider phone consult with endocrinology to get further recommendations. Spoke with RN regarding insulin administration and she states that patient is requesting injections in the back of his arms.  Note that on last admit, Diabetes Coordinator noted that the backs of patients arms did have knots which indicates possible scar tissue. Instructed RN to administer insulin in abdomen instead.  Called and discussed with Dr. Clint GuyHower.  Will follow.  Thanks, Beryl MeagerJenny Vanshika Jastrzebski, RN, BC-ADM Inpatient Diabetes Coordinator Pager [phone] (8a-5p)

## 2016-03-20 NOTE — Plan of Care (Signed)
Problem: Pain Managment: Goal: General experience of comfort will improve Outcome: Progressing Oxycodone and duke mouthwash given for mouth pain with improvement.   Problem: Physical Regulation: Goal: Ability to maintain clinical measurements within normal limits will improve Outcome: Not Progressing CBG>400 prior to dinner. MD notified, orders received.   Problem: Nutrition: Goal: Adequate nutrition will be maintained Outcome: Progressing Educated pt about the importance of complying with diet prescribed.

## 2016-03-20 NOTE — Progress Notes (Addendum)
Notified Dr Clint GuyHower of CBG 460. Pt did not eat dinner yet. Glucose random STAT ordered per sliding scale order. Pt currently not in his room. Per MD to give 20units novolog and 34units of Aspart 70/30. CBG to be checked again at bedtime.

## 2016-03-20 NOTE — Progress Notes (Signed)
  Inpatient Diabetes Program Recommendations  AACE/ADA: New Consensus Statement on Inpatient Glycemic Control (2015)  Target Ranges:  Prepandial:   less than 140 mg/dL      Peak postprandial:   less than 180 mg/dL (1-2 hours)      Critically ill patients:  140 - 180 mg/dL   Lab Results  Component Value Date   GLUCAP 190 (H) 03/20/2016   HGBA1C 13.4 (H) 02/19/2016    Spoke with patient regarding insulin administration.  Reminded him to not administer in arms due to scar tissue.  He states he used to use abdomen however it swelled up so he started using his arms.  Reviewed the importance of not administering insulin in the same site and rotating sites on the abdomen for better absorption.  Talked with patient regarding whether he has seen dentist as he had inquired about this on earlier admission.  He states no, but states that his mouth and teeth hurt him.  Told him to discuss with MD again and that he really needs follow-up with dentist.  He states that he will go to boarding house with his dad on Friday and hopes he can stay in the hospital until then so that he doesn't have to sleep outside.  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   Still unclear if patient understands or comprehends the severity of his disease process.  He lacks the resources to properly care for himself.  Even in the hospital, his blood sugars are very difficult to control.   Will follow. Blood sugars improved at breakfast and lunch.   Thanks, Beryl MeagerJenny Alexsa Flaum, RN, BC-ADM Inpatient Diabetes Coordinator Pager [phone] (8a-5p)

## 2016-03-20 NOTE — Progress Notes (Signed)
[facility] [place] at [facility]   PATIENT NAME: Trevor Brown    MRN#:  [id number]  DATE OF BIRTH:  1991/03/12  SUBJECTIVE:  Hospital Day: 2 days Trevor Brown is a 25 y.o. male presenting with Hyperglycemia; Nausea; and Emesis .   Overnight events: missed dose of insulin yesterday Interval Events: no complaints  REVIEW OF SYSTEMS:  CONSTITUTIONAL: No fever, fatigue or weakness.  EYES: No blurred or double vision.  EARS, NOSE, AND THROAT: No tinnitus or ear pain.  RESPIRATORY: No cough, shortness of breath, wheezing or hemoptysis.  CARDIOVASCULAR: No chest pain, orthopnea, edema.  GASTROINTESTINAL: No nausea, vomiting, diarrhea or abdominal pain.  GENITOURINARY: No dysuria, hematuria.  ENDOCRINE: No polyuria, nocturia,  HEMATOLOGY: No anemia, easy bruising or bleeding SKIN: No rash or lesion. MUSCULOSKELETAL: No joint pain or arthritis.   NEUROLOGIC: No tingling, numbness, weakness.  PSYCHIATRY: No anxiety or depression.   DRUG ALLERGIES:   Allergies  Allergen Reactions  . Bee Venom Anaphylaxis  . Penicillins Anaphylaxis, Hives and Other (See Comments)    Has patient had a PCN reaction causing immediate rash, facial/tongue/throat swelling, SOB or lightheadedness with hypotension: Yes Has patient had a PCN reaction causing severe rash involving mucus membranes or skin necrosis: No Has patient had a PCN reaction that required hospitalization No Has patient had a PCN reaction occurring within the last 10 years: Yes If all of the above answers are "NO", then may proceed with Cephalosporin use.  . Ibuprofen Other (See Comments)    Reaction:  GI upset   . Tramadol Hives and Swelling  . Vancomycin Rash and Other (See Comments)    Reaction:  Red man's syndrome     VITALS:  Blood pressure 134/79, pulse 73, temperature 98.4 F (36.9 C), resp. rate 16, height 5\' 4"  (1.626 m), weight 54.6 kg (120 lb 4.8 oz), SpO2 98 %.  PHYSICAL EXAMINATION:  VITAL  SIGNS: Vitals:   03/19/16 2041 03/20/16 0423  BP: 123/79 134/79  Pulse: 93 73  Resp: 16 16  Temp: 98.4 F (36.9 C) 98.4 F (36.9 C)   GENERAL:25 y.o.male currently in no acute distress.  HEAD: Normocephalic, atraumatic.  EYES: Pupils equal, round, reactive to light. Extraocular muscles intact. No scleral icterus.  MOUTH: Moist mucosal membrane. Dentition poor. No abscess noted.  EAR, NOSE, THROAT: Clear without exudates. No external lesions.  NECK: Supple. No thyromegaly. No nodules. No JVD.  PULMONARY: Clear to ascultation, without wheeze rails or rhonci. No use of accessory muscles, Good respiratory effort. good air entry bilaterally CHEST: Nontender to palpation.  CARDIOVASCULAR: S1 and S2. Regular rate and rhythm. No murmurs, rubs, or gallops. No edema. Pedal pulses 2+ bilaterally.  GASTROINTESTINAL: Soft, nontender, nondistended. No masses. Positive bowel sounds. No hepatosplenomegaly.  MUSCULOSKELETAL: No swelling, clubbing, or edema. Range of motion full in all extremities.  NEUROLOGIC: Cranial nerves II through XII are intact. No gross focal neurological deficits. Sensation intact. Reflexes intact.  SKIN: No ulceration, lesions, rashes, or cyanosis. Skin warm and dry. Turgor intact.  PSYCHIATRIC: Mood, affect within normal limits. The patient is awake, alert and oriented x 3. Insight, judgment intact.      LABORATORY PANEL:   CBC  Recent Labs Lab 03/18/16 0646  WBC 16.1*  HGB 13.2  HCT 39.3*  PLT 301   ------------------------------------------------------------------------------------------------------------------  Chemistries   Recent Labs Lab 03/18/16 2112 03/20/16 0548  NA 133*  --   K 3.6  --   CL 101  --  CO2 27  --   GLUCOSE 62* 408*  BUN 22*  --   CREATININE 0.57*  --   CALCIUM 8.8*  --    ------------------------------------------------------------------------------------------------------------------  Cardiac Enzymes No results for  input(s): TROPONINI in the last 168 hours. ------------------------------------------------------------------------------------------------------------------  RADIOLOGY:  No results found.  EKG:   Orders placed or performed during the hospital encounter of 03/12/16  . EKG 12-Lead  . EKG 12-Lead  . EKG    ASSESSMENT AND PLAN:   Trevor Brown is a 25 y.o. male presenting with Hyperglycemia; Nausea; and Emesis . Admitted 03/18/2016 : Day #: 2 days 1. Type 1 Diabetes: No longer in DKA continue with 70/30 and meal coverage. Difficult to control, even in hospital patient taking snacks, somehow missed dose of insulin  2. Tonsillitis: Completed a ten-day course of clindamycin  Disposition: Patient homeless recently kicked out of, shelter social work to give information about placement with knowledge he has declined this previously   All the records are reviewed and case discussed with Care Management/Social Workerr. Management plans discussed with the patient, family and they are in agreement.  CODE STATUS: full TOTAL TIME TAKING CARE OF THIS PATIENT: 28 minutes.   POSSIBLE D/C IN 1DAYS, DEPENDING ON CLINICAL CONDITION.   Analisia Kingsford,  Mardi Mainland.D on 03/20/2016 at 11:50 AM  Between 7am to 6pm - Pager - [phone]  After 6pm: House Pager: - [phone]  [facility]  Office  [phone]  CC: Primary care physician; No PCP Per Patient

## 2016-03-21 LAB — GLUCOSE, CAPILLARY
Glucose-Capillary: 370 mg/dL — ABNORMAL HIGH (ref 65–99)
Glucose-Capillary: 276 mg/dL — ABNORMAL HIGH (ref 65–99)
Glucose-Capillary: 224 mg/dL — ABNORMAL HIGH (ref 65–99)

## 2016-03-21 MED ORDER — INSULIN ASPART PROT & ASPART (70-30 MIX) 100 UNIT/ML ~~LOC~~ SUSP: 36.0000 [IU] | Freq: Two times a day (BID) | SUBCUTANEOUS | 1 refills | Status: DC

## 2016-03-21 MED ORDER — GLUCERNA SHAKE PO LIQD
237.0000 mL | Freq: Three times a day (TID) | ORAL | Status: DC
  Administered 2016-03-21 (×2): 237 mL via ORAL

## 2016-03-21 NOTE — Discharge Instructions (Signed)
Diabetic diet    Activity as tolerated

## 2016-03-21 NOTE — Care Management (Signed)
Discharge to home today per Dr. Elpidio AnisSudini. Information about [facility] given to [name]. [name] states he has a glucometer, but no strips. Does have insulin. Spoke with Wende BushyHolley Green, [facility] representative, Will be at the [facility] today after 4:oopm. If [name] will come by the clinic today between 4:00pm-8:30pm she will issue a new glucometer and strips today.  Nurse Dedra updated. Father will transport. Gwenette GreetBrenda S Lynx Goodrich RN MSN CCM Care Management [phone]

## 2016-03-21 NOTE — Progress Notes (Signed)
Inpatient Diabetes Program Recommendations  AACE/ADA: New Consensus Statement on Inpatient Glycemic Control (2015)  Target Ranges:  Prepandial:   less than 140 mg/dL      Peak postprandial:   less than 180 mg/dL (1-2 hours)      Critically ill patients:  140 - 180 mg/dL   Lab Results  Component Value Date   GLUCAP 302 (H) 03/20/2016   HGBA1C 13.4 (H) 02/19/2016    Review of Glycemic Control   Results for [name], Mattson B (MRN [id number]) as of 03/21/2016 08:11  Ref. Range 03/20/2016 17:00 03/20/2016 17:25 03/20/2016 17:31 03/20/2016 21:23 03/21/2016 07:53  Glucose-Capillary Latest Ref Range: 65 - 99 mg/dL 409460 (H) 811475 (H)  914302 (H) 370 (H)    Diabetes history: DM1 Outpatient Diabetes medications: 70/30 30 units BID, Novolog 0-9 units ACHS  Current orders for Inpatient glycemic control:  70/30 34 units BID, Novolog 0-9 units ACHS, Novolog 5 units q lunch  Fasting CBG 370mg /dl.  Please consider ordering Novolog 0-6 units PRN for snacks (using 1 unit for every 10 grams of carbohydrates consumed).  Please consider changing Novolog correction at hs to 0-9 units and add 2am correction.   Consider changing Novolog 70/ 30 to 36 units bid   CORRECTION INSULIN CANNOT BE GIVEN CLOSER THAN Q4H.  Susette RacerJulie Adoni Greenough, RN, BA, MHA, CDE Diabetes Coordinator Inpatient Diabetes Program  [phone] (Team Pager) [phone] [facility] Office) 03/21/2016 8:28 AM

## 2016-03-21 NOTE — Progress Notes (Signed)
Patient discharged per MD order. Prescription given to patient. Reminded patient to [facility]. All discharge instructions given to patient and all questions answered.

## 2016-03-21 NOTE — Progress Notes (Signed)
Initial Nutrition Assessment  DOCUMENTATION CODES:   Not applicable  INTERVENTION:  Glucerna Shake po TID, each supplement provides 220 kcal and 10 grams of protein  NUTRITION DIAGNOSIS:   Inadequate oral intake related to nausea, vomiting, acute illness as evidenced by per patient/family report.  GOAL:   Patient will meet greater than or equal to 90% of their needs  MONITOR:   PO intake, I & O's, Supplement acceptance, Labs, Weight trends  REASON FOR ASSESSMENT:   Malnutrition Screening Tool    ASSESSMENT:   Trevor Brown  is a 25 y.o. male with a known history of Type 1 diabetes, noncompliance with insulin presenting with weakness nausea. Patient discharged by myself about to 3 days ago after episode of DKA he states he has not been compliant with his insulin.He complains of feeling weakness, tired, elevated blood sugar  Spoke with [name] briefly at bedside. He states he has had a poor appetite since "about 2 years ago when my Mom died." He also endorses a usual weight of 130#, claims "I weigh 50# now." per chart review pt weights 120# , exhibits a 10#/7.6% severe wt loss in 1 month. Documented PO intake 90-100% in chart thus far. Per NFPA pt appears to have no evidence of muscle wasting or fat depletions. Will order Glucerna, no other interventions necessary.  Labs and medications reviewed: CBGs 302-475, Na 133, Elevated LFTs, Tot Bili 1.4,   Diet Order:  Diet heart healthy/carb modified Room service appropriate? Yes; Fluid consistency: Thin  Skin:  Reviewed, no issues  Last BM:  03/20/2016  Height:   Ht Readings from Last 1 Encounters:  03/19/16 5\' 4"  (1.626 m)    Weight:   Wt Readings from Last 1 Encounters:  03/19/16 120 lb 4.8 oz (54.6 kg)    Ideal Body Weight:  54.54 kg  BMI:  Body mass index is 20.65 kg/m.  Estimated Nutritional Needs:   Kcal:  1650-1950 calories  Protein:  55-70 grams  Fluid:  >/= 1.65L  EDUCATION NEEDS:   No education  needs identified at this time  Trevor AnoWilliam M. Neythan Kozlov, MS, RD LDN Inpatient Clinical Dietitian Pager [phone]

## 2016-03-22 NOTE — Discharge Summary (Signed)
[facility] [place] [facility]   PATIENT NAME: Trevor Brown    MR#:  [id number]  DATE OF BIRTH:  03-Jul-1991  DATE OF ADMISSION:  03/18/2016 ADMITTING PHYSICIAN: Wyatt Hasteavid K Hower, MD  DATE OF DISCHARGE: 03/21/2016  5:30 PM  PRIMARY CARE PHYSICIAN: No PCP Per Patient   ADMISSION DIAGNOSIS:  Dehydration [E86.0] Hyperglycemia [R73.9] Tonsillar abscess [J36]  DISCHARGE DIAGNOSIS:  Active Problems:   DKA (diabetic ketoacidoses) (HCC)   SECONDARY DIAGNOSIS:   Past Medical History:  Diagnosis Date  . Diabetes mellitus without complication (HCC)   . Heart attack (HCC)    Pt claims he had heart attack a year ago ( 2015)- and was [facility], but not given any meds or angiogram, on review of chart- I could not find any details like that.  . Hepatitis C, acute may 2016     ADMITTING HISTORY  Trevor Brown  is a 25 y.o. male with a known history of Type 1 diabetes, noncompliance with insulin presenting with weakness nausea. Patient discharged by myself about to 3 days ago after episode of DKA he states he has not been compliant with his insulin.He complains of feeling weakness, tired, elevated blood sugar. Emergency department meeting DKA criteria started on insulin drip.  HOSPITAL COURSE:   Trevor Brown is a 25 y.o. male presenting with Hyperglycemia; Nausea; and Emesis . Admitted 03/18/2016  1. Type 1 Diabetes With DKA Due to noncompliance with insulin and diet. Patient's blood sugars are improved on day of discharge. Presently on insulin 70/30. Increase to 36 units 2 times a day.  High risk for readmission due to noncompliance.  2. Tonsillitis: Completed a ten-day course of clindamycin  Stable for discharge home.  CONSULTS OBTAINED:    DRUG ALLERGIES:   Allergies  Allergen Reactions  . Bee Venom Anaphylaxis  . Penicillins Anaphylaxis, Hives and Other (See Comments)    Has patient had a PCN reaction causing immediate rash,  facial/tongue/throat swelling, SOB or lightheadedness with hypotension: Yes Has patient had a PCN reaction causing severe rash involving mucus membranes or skin necrosis: No Has patient had a PCN reaction that required hospitalization No Has patient had a PCN reaction occurring within the last 10 years: Yes If all of the above answers are "NO", then may proceed with Cephalosporin use.  . Ibuprofen Other (See Comments)    Reaction:  GI upset   . Tramadol Hives and Swelling  . Vancomycin Rash and Other (See Comments)    Reaction:  Red man's syndrome     DISCHARGE MEDICATIONS:   Discharge Medication List as of 03/21/2016  1:14 PM    CONTINUE these medications which have CHANGED   Details  insulin aspart protamine- aspart (NOVOLOG MIX 70/30) (70-30) 100 UNIT/ML injection Inject 0.36 mLs (36 Units total) into the skin 2 (two) times daily with a meal., Starting Thu 03/21/2016, Print      CONTINUE these medications which have NOT CHANGED   Details  clindamycin (CLEOCIN) 150 MG capsule Take 3 capsules (450 mg total) by mouth 4 (four) times daily., Starting Sat 03/16/2016, Until Tue 03/26/2016, Print    insulin aspart (NOVOLOG) 100 UNIT/ML injection 0-9 Units, Subcutaneous, 3 times daily before meals & bedtime, First dose on Fri 03/15/16 at 2200 Correction coverage: Sensitive (thin, NPO, renal) CBG < 70: implement hypoglycemia protocol CBG 70 - 120: 0 units CBG 121 - 150: 1 unit CBG 151 - 200: 2  units CBG 201 - 250: 3  units CBG 251 - 300: 5 units CBG 301 - 350: 7 units CBG 351 - 400: 9 units, Print    oxyCODONE-acetaminophen (PERCOCET/ROXICET) 5-325 MG tablet Take 1 tablet by mouth every 6 (six) hours as needed for severe pain., Starting Sat 03/16/2016, Until Sun 03/31/2016, Print        Today   VITAL SIGNS:  Blood pressure 115/61, pulse 66, temperature 98.1 F (36.7 C), temperature source Oral, resp. rate 20, height 5\' 4"  (1.626 m), weight 54.6 kg (120 lb 4.8 oz), SpO2 99 %.  I/O:    Intake/Output Summary (Last 24 hours) at 03/22/16 1318 Last data filed at 03/21/16 1347  Gross per 24 hour  Intake              240 ml  Output                0 ml  Net              240 ml    PHYSICAL EXAMINATION:  Physical Exam  GENERAL:  25 y.o.-year-old patient lying in the bed with no acute distress.  LUNGS: Normal breath sounds bilaterally, no wheezing, rales,rhonchi or crepitation. No use of accessory muscles of respiration.  CARDIOVASCULAR: S1, S2 normal. No murmurs, rubs, or gallops.  ABDOMEN: Soft, non-tender, non-distended. Bowel sounds present. No organomegaly or mass.  NEUROLOGIC: Moves all 4 extremities. PSYCHIATRIC: The patient is alert and oriented x 3.  SKIN: No obvious rash, lesion, or ulcer.   DATA REVIEW:   CBC  Recent Labs Lab 03/18/16 0646  WBC 16.1*  HGB 13.2  HCT 39.3*  PLT 301    Chemistries   Recent Labs Lab 03/18/16 2112  03/20/16 1731  NA 133*  --   --   K 3.6  --   --   CL 101  --   --   CO2 27  --   --   GLUCOSE 62*  < > 493*  BUN 22*  --   --   CREATININE 0.57*  --   --   CALCIUM 8.8*  --   --   < > = values in this interval not displayed.  Cardiac Enzymes No results for input(s): TROPONINI in the last 168 hours.  Microbiology Results  Results for orders placed or performed during the hospital encounter of 03/12/16  MRSA PCR Screening     Status: None   Collection Time: 03/12/16 12:30 PM  Result Value Ref Range Status   MRSA by PCR NEGATIVE NEGATIVE Final    Comment:        The GeneXpert MRSA Assay (FDA approved for NASAL specimens only), is one component of a comprehensive MRSA colonization surveillance program. It is not intended to diagnose MRSA infection nor to guide or monitor treatment for MRSA infections.     RADIOLOGY:  No results found.  Follow up with PCP in 1 week.  Management plans discussed with the patient, family and they are in agreement.  CODE STATUS:  Code Status History    Date Active  Date Inactive Code Status Order ID Comments User Context   03/18/2016  8:15 AM 03/21/2016  8:35 PM Full Code [id number]  Wyatt Haste, MD ED   03/12/2016 12:20 PM 03/16/2016  7:27 PM Full Code [id number]  Enedina Finner, MD Inpatient   02/18/2016  4:59 PM 02/19/2016  5:18 PM Full Code [id number]  Ramonita Lab, MD ED   01/12/2016  8:58 AM 01/15/2016  7:33 PM Full Code  [id number]  Milagros Loll, MD ED   01/11/2016 12:35 AM 01/11/2016 11:20 PM Full Code [id number]  Gery Pray, MD Inpatient   12/12/2015 10:04 PM 12/14/2015  6:38 PM Full Code [id number]  Katharina Caper, MD Inpatient   10/23/2015  5:42 PM 10/25/2015  7:04 PM Full Code [id number]  Katha Hamming, MD ED   06/10/2015  8:20 AM 06/12/2015  2:41 PM Full Code [id number]  Arnaldo Natal, MD Inpatient   06/06/2015  8:43 PM 06/07/2015  4:20 PM Full Code [id number]  Enedina Finner, MD Inpatient   04/07/2015 12:45 PM 04/08/2015  8:37 PM Full Code [id number]  Altamese Dilling, MD Inpatient   12/09/2014  2:44 PM 12/13/2014  4:16 PM Full Code [id number]  Gale Journey, MD Inpatient      TOTAL TIME TAKING CARE OF THIS PATIENT ON DAY OF DISCHARGE: more than 30 minutes.   Milagros Loll R M.D on 03/22/2016 at 1:18 PM  Between 7am to 6pm - Pager - [phone]  After 6pm go to [email or web address] - password [email or web address] [facility]  [facility]  Office  [phone]  CC: Primary care physician; No PCP Per Patient  Note: This dictation was prepared with Dragon dictation along with smaller phrase technology. Any transcriptional errors that result from this process are unintentional.

## 2016-03-25 ENCOUNTER — Observation Stay
Admission: EM | Admit: 2016-03-25 | Discharge: 2016-03-27 | Disposition: A | Discharge status: Home / Self Care | Payer: Medicaid Other | Attending: Internal Medicine | Admitting: Internal Medicine

## 2016-03-25 ENCOUNTER — Emergency Department: Payer: Medicaid Other

## 2016-03-25 ENCOUNTER — Encounter: Payer: Self-pay | Admitting: *Deleted

## 2016-03-25 DIAGNOSIS — I252 Old myocardial infarction: Secondary | ICD-10-CM | POA: Insufficient documentation

## 2016-03-25 DIAGNOSIS — R1084 Generalized abdominal pain: Principal | ICD-10-CM | POA: Insufficient documentation

## 2016-03-25 DIAGNOSIS — R739 Hyperglycemia, unspecified: Secondary | ICD-10-CM

## 2016-03-25 DIAGNOSIS — Z716 Tobacco abuse counseling: Secondary | ICD-10-CM

## 2016-03-25 DIAGNOSIS — K029 Dental caries, unspecified: Secondary | ICD-10-CM | POA: Insufficient documentation

## 2016-03-25 DIAGNOSIS — Z794 Long term (current) use of insulin: Secondary | ICD-10-CM | POA: Insufficient documentation

## 2016-03-25 DIAGNOSIS — R945 Abnormal results of liver function studies: Secondary | ICD-10-CM

## 2016-03-25 DIAGNOSIS — Z885 Allergy status to narcotic agent status: Secondary | ICD-10-CM | POA: Insufficient documentation

## 2016-03-25 DIAGNOSIS — Z880 Allergy status to penicillin: Secondary | ICD-10-CM | POA: Insufficient documentation

## 2016-03-25 DIAGNOSIS — R740 Nonspecific elevation of levels of transaminase and lactic acid dehydrogenase [LDH]: Secondary | ICD-10-CM | POA: Insufficient documentation

## 2016-03-25 DIAGNOSIS — Z9119 Patient's noncompliance with other medical treatment and regimen: Secondary | ICD-10-CM | POA: Insufficient documentation

## 2016-03-25 DIAGNOSIS — D649 Anemia, unspecified: Secondary | ICD-10-CM | POA: Insufficient documentation

## 2016-03-25 DIAGNOSIS — Z881 Allergy status to other antibiotic agents status: Secondary | ICD-10-CM | POA: Insufficient documentation

## 2016-03-25 DIAGNOSIS — R7989 Other specified abnormal findings of blood chemistry: Secondary | ICD-10-CM

## 2016-03-25 DIAGNOSIS — F17210 Nicotine dependence, cigarettes, uncomplicated: Secondary | ICD-10-CM | POA: Insufficient documentation

## 2016-03-25 DIAGNOSIS — Z833 Family history of diabetes mellitus: Secondary | ICD-10-CM | POA: Insufficient documentation

## 2016-03-25 DIAGNOSIS — Z886 Allergy status to analgesic agent status: Secondary | ICD-10-CM | POA: Insufficient documentation

## 2016-03-25 DIAGNOSIS — E860 Dehydration: Secondary | ICD-10-CM

## 2016-03-25 DIAGNOSIS — I10 Essential (primary) hypertension: Secondary | ICD-10-CM | POA: Insufficient documentation

## 2016-03-25 DIAGNOSIS — B1920 Unspecified viral hepatitis C without hepatic coma: Secondary | ICD-10-CM | POA: Insufficient documentation

## 2016-03-25 DIAGNOSIS — R1011 Right upper quadrant pain: Secondary | ICD-10-CM

## 2016-03-25 DIAGNOSIS — E109 Type 1 diabetes mellitus without complications: Secondary | ICD-10-CM | POA: Insufficient documentation

## 2016-03-25 DIAGNOSIS — R7401 Elevation of levels of liver transaminase levels: Secondary | ICD-10-CM

## 2016-03-25 DIAGNOSIS — Z91030 Bee allergy status: Secondary | ICD-10-CM | POA: Insufficient documentation

## 2016-03-25 DIAGNOSIS — Z8379 Family history of other diseases of the digestive system: Secondary | ICD-10-CM | POA: Insufficient documentation

## 2016-03-25 DIAGNOSIS — R160 Hepatomegaly, not elsewhere classified: Secondary | ICD-10-CM | POA: Insufficient documentation

## 2016-03-25 DIAGNOSIS — Z590 Homelessness: Secondary | ICD-10-CM | POA: Insufficient documentation

## 2016-03-25 HISTORY — DX: Essential (primary) hypertension: I10

## 2016-03-25 LAB — URINALYSIS COMPLETE WITH MICROSCOPIC (ARMC ONLY)
Bacteria, UA: NONE SEEN
Bilirubin Urine: NEGATIVE
Glucose, UA: >500 mg/dL — AB
Hgb urine dipstick: NEGATIVE
Ketones, ur: NEGATIVE mg/dL
Leukocytes, UA: NEGATIVE
Nitrite: NEGATIVE
Protein, ur: NEGATIVE mg/dL
RBC / HPF: NONE SEEN RBC/hpf (ref 0–5)
Specific Gravity, Urine: 1.023 (ref 1.005–1.030)
Squamous Epithelial / LPF: NONE SEEN
pH: 6 (ref 5.0–8.0)

## 2016-03-25 LAB — BLOOD GAS, VENOUS
Acid-Base Excess: 1.2 mmol/L (ref 0.0–2.0)
Bicarbonate: 26 mmol/L (ref 20.0–28.0)
O2 Saturation: 93.2 %
Patient temperature: 37
pCO2, Ven: 41 mmHg — ABNORMAL LOW (ref 44.0–60.0)
pH, Ven: 7.41 (ref 7.250–7.430)
pO2, Ven: 67 mmHg — ABNORMAL HIGH (ref 32.0–45.0)

## 2016-03-25 LAB — COMPREHENSIVE METABOLIC PANEL
ALT: 552 U/L — ABNORMAL HIGH (ref 17–63)
AST: 588 U/L — ABNORMAL HIGH (ref 15–41)
Albumin: 3.9 g/dL (ref 3.5–5.0)
Alkaline Phosphatase: 302 U/L — ABNORMAL HIGH (ref 38–126)
Anion gap: 12 (ref 5–15)
BUN: 19 mg/dL (ref 6–20)
CO2: 22 mmol/L (ref 22–32)
Calcium: 9.3 mg/dL (ref 8.9–10.3)
Chloride: 100 mmol/L — ABNORMAL LOW (ref 101–111)
Creatinine, Ser: 0.85 mg/dL (ref 0.61–1.24)
GFR calc Af Amer: >60 mL/min (ref >60)
GFR calc non Af Amer: >60 mL/min (ref >60)
Glucose, Bld: 377 mg/dL — ABNORMAL HIGH (ref 65–99)
Potassium: 3.7 mmol/L (ref 3.5–5.1)
Sodium: 134 mmol/L — ABNORMAL LOW (ref 135–145)
Total Bilirubin: 0.4 mg/dL (ref 0.3–1.2)
Total Protein: 7.4 g/dL (ref 6.5–8.1)

## 2016-03-25 LAB — GLUCOSE, CAPILLARY
Glucose-Capillary: 418 mg/dL — ABNORMAL HIGH (ref 65–99)
Glucose-Capillary: 268 mg/dL — ABNORMAL HIGH (ref 65–99)
Glucose-Capillary: 174 mg/dL — ABNORMAL HIGH (ref 65–99)
Glucose-Capillary: 220 mg/dL — ABNORMAL HIGH (ref 65–99)
Glucose-Capillary: 258 mg/dL — ABNORMAL HIGH (ref 65–99)

## 2016-03-25 LAB — TROPONIN I: Troponin I: <0.03 ng/mL (ref <0.03)

## 2016-03-25 LAB — CBC
HCT: 36 % — ABNORMAL LOW (ref 40.0–52.0)
Hemoglobin: 12.4 g/dL — ABNORMAL LOW (ref 13.0–18.0)
MCH: 30.8 pg (ref 26.0–34.0)
MCHC: 34.5 g/dL (ref 32.0–36.0)
MCV: 89.2 fL (ref 80.0–100.0)
Platelets: 313 10*3/uL (ref 150–440)
RBC: 4.04 MIL/uL — ABNORMAL LOW (ref 4.40–5.90)
RDW: 15.1 % — ABNORMAL HIGH (ref 11.5–14.5)
WBC: 6.8 10*3/uL (ref 3.8–10.6)

## 2016-03-25 LAB — LIPASE, BLOOD: Lipase: 22 U/L (ref 11–51)

## 2016-03-25 LAB — PROTIME-INR
INR: 0.94
Prothrombin Time: 12.6 seconds (ref 11.4–15.2)

## 2016-03-25 LAB — ACETAMINOPHEN LEVEL: Acetaminophen (Tylenol), Serum: <10 ug/mL — ABNORMAL LOW (ref 10–30)

## 2016-03-25 MED ORDER — IOPAMIDOL (ISOVUE-300) INJECTION 61%
100.0000 mL | Freq: Once | INTRAVENOUS | Status: AC | PRN
  Administered 2016-03-25: 100 mL via INTRAVENOUS

## 2016-03-25 MED ORDER — MORPHINE SULFATE (PF) 2 MG/ML IV SOLN
INTRAVENOUS | Status: AC
  Administered 2016-03-25: 2 mg via INTRAVENOUS
  Filled 2016-03-25: qty 1

## 2016-03-25 MED ORDER — MORPHINE SULFATE (PF) 4 MG/ML IV SOLN
INTRAVENOUS | Status: AC
  Administered 2016-03-25: 4 mg via INTRAVENOUS
  Filled 2016-03-25: qty 1

## 2016-03-25 MED ORDER — SODIUM CHLORIDE 0.9 % IV BOLUS (SEPSIS)
1000.0000 mL | Freq: Once | INTRAVENOUS | Status: AC
  Administered 2016-03-25: 1000 mL via INTRAVENOUS

## 2016-03-25 MED ORDER — MORPHINE SULFATE (PF) 2 MG/ML IV SOLN
2.0000 mg | Freq: Once | INTRAVENOUS | Status: AC
  Administered 2016-03-25: 2 mg via INTRAVENOUS

## 2016-03-25 MED ORDER — INSULIN ASPART 100 UNIT/ML ~~LOC~~ SOLN
6.0000 [IU] | Freq: Once | SUBCUTANEOUS | Status: DC
  Filled 2016-03-25: qty 6

## 2016-03-25 MED ORDER — IOPAMIDOL (ISOVUE-300) INJECTION 61%
30.0000 mL | Freq: Once | INTRAVENOUS | Status: AC | PRN
  Administered 2016-03-25: 30 mL via ORAL

## 2016-03-25 MED ORDER — ONDANSETRON HCL 4 MG/2ML IJ SOLN
4.0000 mg | Freq: Once | INTRAMUSCULAR | Status: AC
  Administered 2016-03-25: 4 mg via INTRAVENOUS
  Filled 2016-03-25: qty 2

## 2016-03-25 MED ORDER — MORPHINE SULFATE (PF) 4 MG/ML IV SOLN
4.0000 mg | Freq: Once | INTRAVENOUS | Status: AC
  Administered 2016-03-25: 4 mg via INTRAVENOUS

## 2016-03-25 MED ORDER — MORPHINE SULFATE (PF) 4 MG/ML IV SOLN
4.0000 mg | Freq: Once | INTRAVENOUS | Status: AC
  Administered 2016-03-25: 4 mg via INTRAVENOUS
  Filled 2016-03-25: qty 1

## 2016-03-25 NOTE — ED Notes (Signed)
CT notified that pt is finished drinking contrast.  

## 2016-03-25 NOTE — ED Notes (Signed)
MD notified of FSBS result of 258. No new orders for blood sugar.

## 2016-03-25 NOTE — ED Triage Notes (Signed)
States hyperglycemia since yesterday, states taking 24 units of 70/30 BID

## 2016-03-25 NOTE — ED Provider Notes (Signed)
[facility] Emergency Department Provider Note   ____________________________________________   First MD Initiated Contact with Patient 03/25/16 Rickey Primus     (approximate)  I have reviewed the triage vital signs and the nursing notes.   HISTORY  Chief Complaint Hyperglycemia    HPI Trevor Brown is a 25 y.o. male history of known diabetes, hepatitis presents for evaluation as he reports he feels "dehydrated" and has been" urinating a lot". He reports that he is having problems with his diabetes control. He is currently homeless, reports he is living in city [place] but he is using his insulin last used 24 units of 70/30 this morning, and reports he has his syringes, has his insulin, and has been using it twice daily as advised.  Reports feeling of nausea but no vomiting. Reports his teeth been hurting him for several days, and ongoing issue and not worsening. No trouble swallowing. No chest pain or shortness of breath.   Past Medical History:  Diagnosis Date  . Diabetes mellitus without complication (HCC)   . Heart attack (HCC)    Pt claims he had heart attack a year ago ( 2015)- and was admitted in [facility] for that, but not given any meds or angiogram, on review of chart- I could not find any details like that.  . Hepatitis C, acute may 2016  . Hypertension     Patient Active Problem List   Diagnosis Date Noted  . Transaminitis 03/25/2016  . Tobacco abuse 01/10/2016  . Type 1 diabetes mellitus with hyperglycemia (HCC) 12/12/2015  . Cocaine abuse 12/12/2015  . DKA (diabetic ketoacidoses) (HCC) 06/10/2015  . Hidradenitis suppurativa of left axilla   . DKA, type 1 (HCC) 06/06/2015  . Malnutrition of moderate degree (HCC) 04/08/2015  . Hepatitis C 12/13/2014  . Diabetes type 1, uncontrolled (HCC) 12/11/2014  . Major depressive disorder, single episode, mild (HCC)   . Major depression, single episode 12/10/2014  . Diabetes mellitus type 1, uncontrolled (HCC)  12/09/2014  . Homelessness 12/09/2014    History reviewed. No pertinent surgical history.  Prior to Admission medications   Medication Sig Start Date End Date Taking? Authorizing Provider  clindamycin (CLEOCIN) 150 MG capsule Take 3 capsules (450 mg total) by mouth 4 (four) times daily. 03/16/16 03/26/16 Yes Wyatt Haste, MD  insulin aspart (NOVOLOG) 100 UNIT/ML injection 0-9 Units, Subcutaneous, 3 times daily before meals & bedtime, First dose on Fri 03/15/16 at 2200 Correction coverage: Sensitive (thin, NPO, renal) CBG < 70: implement hypoglycemia protocol CBG 70 - 120: 0 units CBG 121 - 150: 1 unit CBG 151 - 200: 2 units CBG 201 - 250: 3 units CBG 251 - 300: 5 units CBG 301 - 350: 7 units CBG 351 - 400: 9 units 03/16/16  Yes Wyatt Haste, MD  insulin aspart protamine- aspart (NOVOLOG MIX 70/30) (70-30) 100 UNIT/ML injection Inject 0.36 mLs (36 Units total) into the skin 2 (two) times daily with a meal. 03/21/16  Yes Srikar Sudini, MD  oxyCODONE-acetaminophen (PERCOCET/ROXICET) 5-325 MG tablet Take 1 tablet by mouth every 6 (six) hours as needed for severe pain. 03/16/16 03/31/16 Yes Wyatt Haste, MD    Allergies Bee venom; Penicillins; Ibuprofen; Tramadol; and Vancomycin  Family History  Problem Relation Age of Onset  . Cirrhosis Mother   . Diabetes Mellitus II Maternal Grandmother     Social History Social History  Substance Use Topics  . Smoking status: Current Every Day Smoker    Packs/day: 1.50  Types: Cigarettes  . Smokeless tobacco: Never Used  . Alcohol use No    Review of Systems Constitutional: No fever/chills Eyes: No visual changes. ENT: No sore throat.Reports he has tooth pain, but is currently on antibiotics for this. This is getting better. Cardiovascular: Denies chest pain. Respiratory: Denies shortness of breath. Gastrointestinal: Some mild pain in the abdomen, somewhat right-sided but just noticed it now.  Nausea, no vomiting.  No diarrhea.  No  constipation. Genitourinary: Negative for dysuria. Reports increased urination and thirst. Musculoskeletal: Negative for back pain. Skin: Negative for rash. Neurological: Negative for headaches, focal weakness or numbness.  10-point ROS otherwise negative.  ____________________________________________   PHYSICAL EXAM:  VITAL SIGNS: ED Triage Vitals  Enc Vitals Group     BP 03/25/16 1757 (!) 160/91     Pulse Rate 03/25/16 1757 (!) 111     Resp 03/25/16 1757 18     Temp 03/25/16 1757 98.2 F (36.8 C)     Temp Source 03/25/16 1757 Oral     SpO2 03/25/16 1757 99 %     Weight 03/25/16 1758 130 lb (59 kg)     Height 03/25/16 1758 5\' 4"  (1.626 m)     Head Circumference --      Peak Flow --      Pain Score 03/25/16 1758 7     Pain Loc --      Pain Edu? --      Excl. in GC? --     Constitutional: Alert and oriented. Somewhat disheveled in appearance, appears mildly ill, fatigued, but in no acute distress Eyes: Conjunctivae are normal. PERRL. EOMI. Head: Atraumatic. Nose: No congestion/rhinnorhea. Mouth/Throat: Mucous membranes are very dry.  Oropharynx non-erythematous. Neck: No stridor.   Cardiovascular: Tachycardic rate, regular rhythm. Grossly normal heart sounds.  Good peripheral circulation. Respiratory: Normal respiratory effort.  No retractions. Lungs CTAB. No kausmal breathing. Gastrointestinal: Soft and nontende except for reporting moderate tenderness to palpation in the right upper quadrant r. No distention. No abdominal bruits. No CVA tenderness. No pain at McBurney's point Musculoskeletal: No lower extremity tenderness nor edema.  No joint effusions. Neurologic:  Normal speech and language. No gross focal neurologic deficits are appreciated. No gait instability. Skin:  Skin is warm, slightly diaphoretic and intact. No rash noted. Psychiatric: Mood and affect are normal. Speech and behavior are normal.  ____________________________________________   LABS (all labs  ordered are listed, but only abnormal results are displayed)  Labs Reviewed  GLUCOSE, CAPILLARY - Abnormal; Notable for the following:       Result Value   Glucose-Capillary 418 (*)    All other components within normal limits  CBC - Abnormal; Notable for the following:    RBC 4.04 (*)    Hemoglobin 12.4 (*)    HCT 36.0 (*)    RDW 15.1 (*)    All other components within normal limits  COMPREHENSIVE METABOLIC PANEL - Abnormal; Notable for the following:    Sodium 134 (*)    Chloride 100 (*)    Glucose, Bld 377 (*)    AST 588 (*)    ALT 552 (*)    Alkaline Phosphatase 302 (*)    All other components within normal limits  BLOOD GAS, VENOUS - Abnormal; Notable for the following:    pCO2, Ven 41 (*)    pO2, Ven 67.0 (*)    All other components within normal limits  URINALYSIS COMPLETEWITH MICROSCOPIC ([facility] ONLY) - Abnormal; Notable for the following:  Color, Urine STRAW (*)    APPearance CLEAR (*)    Glucose, UA >500 (*)    All other components within normal limits  ACETAMINOPHEN LEVEL - Abnormal; Notable for the following:    Acetaminophen (Tylenol), Serum <10 (*)    All other components within normal limits  GLUCOSE, CAPILLARY - Abnormal; Notable for the following:    Glucose-Capillary 268 (*)    All other components within normal limits  GLUCOSE, CAPILLARY - Abnormal; Notable for the following:    Glucose-Capillary 174 (*)    All other components within normal limits  GLUCOSE, CAPILLARY - Abnormal; Notable for the following:    Glucose-Capillary 220 (*)    All other components within normal limits  GLUCOSE, CAPILLARY - Abnormal; Notable for the following:    Glucose-Capillary 258 (*)    All other components within normal limits  LIPASE, BLOOD  TROPONIN I  PROTIME-INR  CBG MONITORING, ED  CBG MONITORING, ED  CBG MONITORING, ED  CBG MONITORING, ED  CBG MONITORING, ED  CBG MONITORING, ED   ____________________________________________  EKG  Reviewed and  interpreted by me at 1915 Heart rate 80 Normal sinus rhythm QTC 420 PR 120 Normal sinus rhythm, evidence of minimal T-wave abnormality noted in V3 with inversion alone, which does appear different than his previous EKG from August 22 however may be positional in nature.  Patient does not complain of any chest pain ____________________________________________  RADIOLOGY  Ct Abdomen Pelvis W Contrast  Result Date: 03/25/2016 CLINICAL DATA:  Right sided abdominal pain and transaminitis. EXAM: CT ABDOMEN AND PELVIS WITH CONTRAST TECHNIQUE: Multidetector CT imaging of the abdomen and pelvis was performed using the standard protocol following bolus administration of intravenous contrast. CONTRAST:  100mL ISOVUE-300 IOPAMIDOL (ISOVUE-300) INJECTION 61% COMPARISON:  12/11/2014 FINDINGS: Lower chest: No pulmonary nodules, pleural effusions, or infiltrates. Heart size is normal. No imaged pericardial effusion or significant coronary artery calcifications. Hepatobiliary: The liver is enlarged and homogeneous. No focal liver lesions are identified. The gallbladder is decompressed. Pancreas: Normal appearance. Spleen: Normal appearance. Renal/Adrenal: Normal adrenal glands. Normal appearance of both kidneys. No hydronephrosis or renal mass. Gastrointestinal tract: The stomach is normal in appearance. Small bowel loops show mildly thickened folds but are normal in caliber and wall thickness. There is a large amount of stool throughout nondilated loops of colon. The appendix is well seen and has a normal appearance. Reproductive/Pelvis: Urinary bladder is normal in appearance. Prostate gland and seminal vesicles are normal in appearance. Vascular/Lymphatic: No evidence for aortic aneurysm. Small low-attenuation lymph nodes are identified at the root of the mesentery. Representative node is 1.0 x 1.7 cm on image 32 of series 5. Musculoskeletal/Abdominal wall: Visualized osseous structures have a normal appearance.  Abdominal wall is unremarkable. Other: none IMPRESSION: 1. Hepatomegaly without focal lesions. 2. Mildly thickened small bowel folds. 3. Low-attenuation mesenteric adenopathy. 4. Considerations include inflammatory or infectious viral or bacterial processes. Whipple's disease should be considered. Electronically Signed   By: Norva PavlovElizabeth  Brown M.D.   On: 03/25/2016 20:39    ____________________________________________   PROCEDURES  Procedure(s) performed: None  Procedures  Critical Care performed: No  ____________________________________________   INITIAL IMPRESSION / ASSESSMENT AND PLAN / ED COURSE  Pertinent labs & imaging results that were available during my care of the patient were reviewed by me and considered in my medical decision making (see chart for details).  Patient presents for evaluation of fatigue, polyuria, polydipsia with elevated glucose. History of diabetes, and question if he is truly  compliant though he does report compliance. He does appear dehydrated, and also reports having mild pain in the right upper abdomen on examination. We will evaluate with coverage of metabolic panel, venous gas, begin early hydration, evaluate for possible DKA, infection, or other precipitating factor. He does have poor dentition but no evidence of any abscess, and he reports this is chronic.  ----------------------------------------- 7:11 PM on 03/25/2016 -----------------------------------------  Significant elevation of transaminases as noted. Patient denies alcohol abuse, denies overdose or acetaminophen use. Does have a history of hepatitis C, question if he may be experiencing acute hepatitis. I will order CT scan of the abdomen and pelvis, pain control, antiemetics and will continue to monitor. Labs thus far do not strongly indicate DKA.  Clinical Course    Patient continues to note ongoing abdominal pain, dosed with 3 doses of IV narcotics. Pain does appear improving, but then  reviewing he has what appears to be an acute transaminitis though he does carry a history of hepatitis C, no evidence of acute biliary process on CT. I discussed CT findings with Dr. Mechele Collin of gastroenterology who recommends the patient be admitted for further workup, further evaluation of acute transaminitis, as well as workup for possible changes noted on CT within the bowels and a question of "Whipple's disease."  Discussed with the patient, agreeable with plan for admission. Addition there seems to be significant social and case management aspects of needed care as the patient is currently homeless, diabetic, has had multiple re-presentations the hospital, [place]. Patient felt not to be suitable for outpatient follow-up when discussed with Dr. Mechele Collin, and frankly I would agree.  ____________________________________________   FINAL CLINICAL IMPRESSION(S) / ED DIAGNOSES  Final diagnoses:  Dehydration  Elevated LFTs  Abdominal pain, right upper quadrant  Hyperglycemia      NEW MEDICATIONS STARTED DURING THIS VISIT:  New Prescriptions   No medications on file     Note:  This document was prepared using Dragon voice recognition software and may include unintentional dictation errors.     Sharyn Creamer, MD 03/26/16 [phone]

## 2016-03-25 NOTE — ED Notes (Signed)
Pt's FSBS down to 268 after 1L of NS. MD notified. Order for insulin discontinued, will check FSBS again after second liter of NS finished.

## 2016-03-26 LAB — CBC
HCT: 35.1 % — ABNORMAL LOW (ref 40.0–52.0)
Hemoglobin: 12.3 g/dL — ABNORMAL LOW (ref 13.0–18.0)
MCH: 30.7 pg (ref 26.0–34.0)
MCHC: 35 g/dL (ref 32.0–36.0)
MCV: 87.8 fL (ref 80.0–100.0)
Platelets: 292 10*3/uL (ref 150–440)
RBC: 3.99 MIL/uL — ABNORMAL LOW (ref 4.40–5.90)
RDW: 15 % — ABNORMAL HIGH (ref 11.5–14.5)
WBC: 5.2 10*3/uL (ref 3.8–10.6)

## 2016-03-26 LAB — GAMMA GT: GGT: 133 U/L — ABNORMAL HIGH (ref 7–50)

## 2016-03-26 LAB — HEPATIC FUNCTION PANEL
ALT: 606 U/L — ABNORMAL HIGH (ref 17–63)
AST: 644 U/L — ABNORMAL HIGH (ref 15–41)
Albumin: 3.6 g/dL (ref 3.5–5.0)
Alkaline Phosphatase: 291 U/L — ABNORMAL HIGH (ref 38–126)
Bilirubin, Direct: 0.1 mg/dL (ref 0.1–0.5)
Indirect Bilirubin: 0.5 mg/dL (ref 0.3–0.9)
Total Bilirubin: 0.6 mg/dL (ref 0.3–1.2)
Total Protein: 7.2 g/dL (ref 6.5–8.1)

## 2016-03-26 LAB — ETHANOL: Alcohol, Ethyl (B): <5 mg/dL (ref <5)

## 2016-03-26 LAB — BASIC METABOLIC PANEL
Anion gap: 9 (ref 5–15)
BUN: 11 mg/dL (ref 6–20)
CO2: 23 mmol/L (ref 22–32)
Calcium: 8.9 mg/dL (ref 8.9–10.3)
Chloride: 103 mmol/L (ref 101–111)
Creatinine, Ser: 0.76 mg/dL (ref 0.61–1.24)
GFR calc Af Amer: >60 mL/min (ref >60)
GFR calc non Af Amer: >60 mL/min (ref >60)
Glucose, Bld: 320 mg/dL — ABNORMAL HIGH (ref 65–99)
Potassium: 4.6 mmol/L (ref 3.5–5.1)
Sodium: 135 mmol/L (ref 135–145)

## 2016-03-26 LAB — MAGNESIUM: Magnesium: 1.8 mg/dL (ref 1.7–2.4)

## 2016-03-26 LAB — FERRITIN: Ferritin: 114 ng/mL (ref 24–336)

## 2016-03-26 LAB — IRON AND TIBC
Iron: 145 ug/dL (ref 45–182)
Saturation Ratios: 35 % (ref 17.9–39.5)
TIBC: 417 ug/dL (ref 250–450)
UIBC: 272 ug/dL

## 2016-03-26 LAB — TSH: TSH: 1.088 u[IU]/mL (ref 0.350–4.500)

## 2016-03-26 LAB — GLUCOSE, CAPILLARY
Glucose-Capillary: 198 mg/dL — ABNORMAL HIGH (ref 65–99)
Glucose-Capillary: 210 mg/dL — ABNORMAL HIGH (ref 65–99)
Glucose-Capillary: 256 mg/dL — ABNORMAL HIGH (ref 65–99)
Glucose-Capillary: 279 mg/dL — ABNORMAL HIGH (ref 65–99)
Glucose-Capillary: 330 mg/dL — ABNORMAL HIGH (ref 65–99)
Glucose-Capillary: 393 mg/dL — ABNORMAL HIGH (ref 65–99)

## 2016-03-26 LAB — PHOSPHORUS: Phosphorus: 3 mg/dL (ref 2.5–4.6)

## 2016-03-26 LAB — AMMONIA: Ammonia: 17 umol/L (ref 9–35)

## 2016-03-26 MED ORDER — INSULIN ASPART 100 UNIT/ML ~~LOC~~ SOLN
0.0000 [IU] | Freq: Every day | SUBCUTANEOUS | Status: DC
  Administered 2016-03-26: 3 [IU] via SUBCUTANEOUS
  Administered 2016-03-26: 2 [IU] via SUBCUTANEOUS
  Filled 2016-03-26: qty 2
  Filled 2016-03-26: qty 3

## 2016-03-26 MED ORDER — SODIUM CHLORIDE 0.9 % IV SOLN
INTRAVENOUS | Status: DC
  Administered 2016-03-26 (×2): via INTRAVENOUS

## 2016-03-26 MED ORDER — OXYCODONE-ACETAMINOPHEN 5-325 MG PO TABS
1.0000 | ORAL_TABLET | Freq: Four times a day (QID) | ORAL | Status: DC | PRN
  Administered 2016-03-26 (×2): 1 via ORAL
  Filled 2016-03-26 (×2): qty 1

## 2016-03-26 MED ORDER — INSULIN ASPART PROT & ASPART (70-30 MIX) 100 UNIT/ML ~~LOC~~ SUSP
36.0000 [IU] | Freq: Two times a day (BID) | SUBCUTANEOUS | Status: DC
  Administered 2016-03-26 – 2016-03-27 (×3): 36 [IU] via SUBCUTANEOUS
  Filled 2016-03-26 (×3): qty 36

## 2016-03-26 MED ORDER — ZOLPIDEM TARTRATE 5 MG PO TABS: 5.0000 mg | ORAL_TABLET | Freq: Every evening | ORAL | Status: DC | PRN

## 2016-03-26 MED ORDER — OXYCODONE HCL 5 MG PO TABS
5.0000 mg | ORAL_TABLET | Freq: Four times a day (QID) | ORAL | Status: DC | PRN
  Administered 2016-03-26 – 2016-03-27 (×4): 5 mg via ORAL
  Filled 2016-03-26 (×4): qty 1

## 2016-03-26 MED ORDER — CLINDAMYCIN HCL 150 MG PO CAPS
450.0000 mg | ORAL_CAPSULE | Freq: Four times a day (QID) | ORAL | Status: DC
  Administered 2016-03-26 – 2016-03-27 (×6): 450 mg via ORAL
  Filled 2016-03-26 (×6): qty 3

## 2016-03-26 MED ORDER — INSULIN ASPART 100 UNIT/ML ~~LOC~~ SOLN
0.0000 [IU] | Freq: Three times a day (TID) | SUBCUTANEOUS | Status: DC
  Administered 2016-03-26: 7 [IU] via SUBCUTANEOUS
  Administered 2016-03-26: 9 [IU] via SUBCUTANEOUS
  Administered 2016-03-26 – 2016-03-27 (×2): 2 [IU] via SUBCUTANEOUS
  Administered 2016-03-27: 9 [IU] via SUBCUTANEOUS
  Filled 2016-03-26: qty 7
  Filled 2016-03-26 (×2): qty 2
  Filled 2016-03-26 (×2): qty 9

## 2016-03-26 MED ORDER — MORPHINE SULFATE (PF) 2 MG/ML IV SOLN
2.0000 mg | Freq: Once | INTRAVENOUS | Status: AC
  Administered 2016-03-26: 07:00:00 2 mg via INTRAVENOUS
  Filled 2016-03-26: qty 1

## 2016-03-26 NOTE — H&P (Signed)
[facility] - [place] @ [facility] Admission History and Physical [employer], D.O.  ---------------------------------------------------------------------------------------------------------------------   PATIENT NAME: Trevor Brown MR#: [id number] DATE OF BIRTH: 1990/10/30 DATE OF ADMISSION: 03/25/2016 PRIMARY CARE PHYSICIAN: No PCP Per Patient  REQUESTING/REFERRING PHYSICIAN: ED Dr. Fanny Bien  CHIEF COMPLAINT: Chief Complaint  Patient presents with  . Hyperglycemia    HISTORY OF PRESENT ILLNESS: Trevor Brown is a 25 y.o. male with a known history of Insulin-dependent DM, Hypertension, hepatitis C major depression  with multiple recent admissions for hyperglycemia, DKA, dehydration and came to the emergency department today complaining of dry mouth, dehydration, elevated glucose and polyuria. Patient states that he is homeless, sleeping on park benches. He reports some chills but no fever, nausea but no vomiting, mild abdominal pain which is diffuse and chronic as well as severe pain diffusely secondary to chronic caries. He does not have a dentist and has not pursued oral or dental care.  His medication regimen was adjusted and his last discharge from the hospital several days ago on 03/21/2016. He states that he had been taking his 24 units of 7030 insulin this morning, states that he has all of his medications and his supplies and has been taking his medication as directed.   Otherwise there has been no change in status. Patient has been taking medication as prescribed and there has been no recent change in medication or diet.  There has been no recent illness, travel or sick contacts.    Patient denies fever, cough, weakness, dizziness, chest pain, shortness of breath, C/D, dysuria, changes in mental status.    PAST MEDICAL HISTORY: Past Medical History:  Diagnosis Date  . Diabetes mellitus without complication (HCC)   . Heart attack (HCC)    Pt claims he had heart attack a  year ago ( 2015)- and was admitted in [facility] for that, but not given any meds or angiogram, on review of chart- I could not find any details like that.  . Hepatitis C, acute may 2016  . Hypertension       PAST SURGICAL HISTORY: History reviewed. No pertinent surgical history.    SOCIAL HISTORY: Social History  Substance Use Topics  . Smoking status: Current Every Day Smoker    Packs/day: 1.50    Types: Cigarettes  . Smokeless tobacco: Never Used  . Alcohol use No   Denies illicit drug use   FAMILY HISTORY: Family History  Problem Relation Age of Onset  . Cirrhosis Mother   . Diabetes Mellitus II Maternal Grandmother      MEDICATIONS AT HOME: Prior to Admission medications   Medication Sig Start Date End Date Taking? Authorizing Provider  clindamycin (CLEOCIN) 150 MG capsule Take 3 capsules (450 mg total) by mouth 4 (four) times daily. 03/16/16 03/26/16 Yes Wyatt Haste, MD  insulin aspart (NOVOLOG) 100 UNIT/ML injection 0-9 Units, Subcutaneous, 3 times daily before meals & bedtime, First dose on Fri 03/15/16 at 2200 Correction coverage: Sensitive (thin, NPO, renal) CBG < 70: implement hypoglycemia protocol CBG 70 - 120: 0 units CBG 121 - 150: 1 unit CBG 151 - 200: 2 units CBG 201 - 250: 3 units CBG 251 - 300: 5 units CBG 301 - 350: 7 units CBG 351 - 400: 9 units 03/16/16  Yes Wyatt Haste, MD  insulin aspart protamine- aspart (NOVOLOG MIX 70/30) (70-30) 100 UNIT/ML injection Inject 0.36 mLs (36 Units total) into the skin 2 (two) times daily with a meal. 03/21/16  Yes Milagros Loll, MD  oxyCODONE-acetaminophen (PERCOCET/ROXICET) 5-325 MG tablet Take 1 tablet by mouth every 6 (six) hours as needed for severe pain. 03/16/16 03/31/16 Yes Wyatt Hasteavid K Hower, MD      DRUG ALLERGIES: Allergies  Allergen Reactions  . Bee Venom Anaphylaxis  . Penicillins Anaphylaxis, Hives and Other (See Comments)    Has patient had a PCN reaction causing immediate rash, facial/tongue/throat swelling,  SOB or lightheadedness with hypotension: Yes Has patient had a PCN reaction causing severe rash involving mucus membranes or skin necrosis: No Has patient had a PCN reaction that required hospitalization No Has patient had a PCN reaction occurring within the last 10 years: Yes If all of the above answers are "NO", then may proceed with Cephalosporin use.  . Ibuprofen Other (See Comments)    Reaction:  GI upset   . Tramadol Hives and Swelling  . Vancomycin Rash and Other (See Comments)    Reaction:  Red man's syndrome      REVIEW OF SYSTEMS: CONSTITUTIONAL: Positive chills. fatigue, weakness, weight gain/loss, headache EYES: No blurry or double vision. ENT: No tinnitus, postnasal drip, redness or soreness of the oropharynx. Positive dental pain RESPIRATORY: No cough, wheeze, hemoptysis, dyspnea. CARDIOVASCULAR: No chest pain, orthopnea, palpitations, syncope. GASTROINTESTINAL: Positive nausea and abdominal pain. No constipation or diarrhea. No vomiting  hematemesis, melena or hematochezia. GENITOURINARY: No dysuria or hematuria. Other polyuria ENDOCRINE: No nocturia. No heat or cold intolerance. HEMATOLOGY: No anemia, bruising, bleeding. INTEGUMENTARY: No rashes, ulcers, lesions. MUSCULOSKELETAL: No arthritis, swelling, gout. NEUROLOGIC: No numbness, tingling, weakness or ataxia. No seizure-type activity. PSYCHIATRIC: No anxiety, insomnia. positive depression   PHYSICAL EXAMINATION: VITAL SIGNS: Blood pressure 128/80, pulse (!) 59, temperature 97.8 F (36.6 C), temperature source Oral, resp. rate 20, height 5\' 4"  (1.626 m), weight 55 kg (121 lb 3.2 oz), SpO2 100 %.  GENERAL: 25 y.o.-year-old white male patient, ill-appearing, fatigued, answers questions with one word answers but does not offer any additional conversation.   HEENT: Head atraumatic, normocephalic. Pupils equal, round, reactive to light and accommodation. No scleral icterus. Extraocular muscles intact. Nares are patent.  Oropharynx is clear. Mucus membranes moist. Very poor dentition with severe caries.  NECK: Supple, full range of motion. No JVD, no bruit heard. No thyroid enlargement, no tenderness, no cervical lymphadenopathy. CHEST: Normal breath sounds bilaterally. No wheezing, rales, rhonchi or crackles. No use of accessory muscles of respiration.  No reproducible chest wall tenderness.  CARDIOVASCULAR: S1, S2 normal. No murmurs, rubs, or gallops. Cap refill <2 seconds. ABDOMEN: Soft, nondistended. Mild tenderness diffusely.  No rebound, guarding, rigidity. Normoactive bowel sounds present in all four quadrants. No organomegaly or mass. EXTREMITIES: Full range of motion. No pedal edema, cyanosis, or clubbing. NEUROLOGIC: Cranial nerves II through XII are grossly intact with no focal sensorimotor deficit. Muscle strength 5/5 in all extremities. Sensation intact. Gait not checked. PSYCHIATRIC: The patient is alert and oriented x 3. Normal affect, mood, thought content. SKIN: Warm, dry, and intact without obvious rash, lesion, or ulcer.  LABORATORY PANEL:  CBC  Recent Labs Lab 03/25/16 1759  WBC 6.8  HGB 12.4*  HCT 36.0*  PLT 313   ----------------------------------------------------------------------------------------------------------------- Chemistries  Recent Labs Lab 03/25/16 1759  NA 134*  K 3.7  CL 100*  CO2 22  GLUCOSE 377*  BUN 19  CREATININE 0.85  CALCIUM 9.3  AST 588*  ALT 552*  ALKPHOS 302*  BILITOT 0.4   ------------------------------------------------------------------------------------------------------------------ Cardiac Enzymes  Recent Labs Lab 03/25/16 1759  TROPONINI <0.03   ------------------------------------------------------------------------------------------------------------------  RADIOLOGY: Ct  Abdomen Pelvis W Contrast  Result Date: 03/25/2016 CLINICAL DATA:  Right sided abdominal pain and transaminitis. EXAM: CT ABDOMEN AND PELVIS WITH CONTRAST  TECHNIQUE: Multidetector CT imaging of the abdomen and pelvis was performed using the standard protocol following bolus administration of intravenous contrast. CONTRAST:  ISOVUE-300 IOPAMIDOL (ISOVUE-300) INJECTION 61% COMPARISON:  12/11/2014 FINDINGS: Lower chest: No pulmonary nodules, pleural effusions, or infiltrates. Heart size is normal. No imaged pericardial effusion or significant coronary artery calcifications. Hepatobiliary: The liver is enlarged and homogeneous. No focal liver lesions are identified. The gallbladder is decompressed. Pancreas: Normal appearance. Spleen: Normal appearance. Renal/Adrenal: Normal adrenal glands. Normal appearance of both kidneys. No hydronephrosis or renal mass. Gastrointestinal tract: The stomach is normal in appearance. Small bowel loops show mildly thickened folds but are normal in caliber and wall thickness. There is a large amount of stool throughout nondilated loops of colon. The appendix is well seen and has a normal appearance. Reproductive/Pelvis: Urinary bladder is normal in appearance. Prostate gland and seminal vesicles are normal in appearance. Vascular/Lymphatic: No evidence for aortic aneurysm. Small low-attenuation lymph nodes are identified at the root of the mesentery. Representative node is 1.0 x 1.7 cm on image 32 of series 5. Musculoskeletal/Abdominal wall: Visualized osseous structures have a normal appearance. Abdominal wall is unremarkable. Other: none IMPRESSION: 1. Hepatomegaly without focal lesions. 2. Mildly thickened small bowel folds. 3. Low-attenuation mesenteric adenopathy. 4. Considerations include inflammatory or infectious viral or bacterial processes. Whipple's disease should be considered. Electronically Signed   By: Norva Pavlov M.D.   On: 03/25/2016 20:39    EKG: Sinus at 80 bpm with nonspecific ST and T wave changes.  IMPRESSION AND PLAN:  This is a 25 y.o. male with a history of IDDM, HTN, dental caries now being  admitted with: 1. Dehydration and Transaminitis, acute with history of hepatitis C. Patient has no evidence of DKA, only mild hyperglycemia. Emergency department discussion with GI recommended admission for IV hydration, glucose control and GI consultation in a.m. We'll also order hepatitis panel 2. Abdominal pain with mild thickened small bowel, consider Whipple's disease. Pain control, IV fluids, GI consultation 3.  Severe dental caries-continue clindamycin. 4. Social work consultation regarding homeless status, dental care and ongoing primary care.   Diet/Nutrition:Carb controlled IV normal saline DVT Px: SCDs and early ambulation Code Status: Full  All the records are reviewed and case discussed with ED provider. Management plans discussed with the patient and/or family who express understanding and agree with plan of care.   TOTAL TIME TAKING CARE OF THIS PATIENT: 60 minutes.   Julien Berryman D.O. on 03/26/2016 at 1:59 AM Between 7am to 6pm - Pager - [phone] After 6pm go to [email or web address] - [employer] [facility] Office [phone] CC: Primary care physician; No PCP Per Patient     Note: This dictation was prepared with Dragon dictation along with smaller phrase technology. Any transcriptional errors that result from this process are unintentional.

## 2016-03-26 NOTE — ED Notes (Signed)
Report given to floor RN. Pt taken to floor via stretcher. Vital signs stable prior to transport.  

## 2016-03-26 NOTE — Plan of Care (Signed)
Problem: Education: Goal: Knowledge of [facility] Education information/materials will improve Outcome: Progressing PAST MEDICAL HISTORY:     Past Medical History:  Diagnosis Date  . Diabetes mellitus without complication (HCC)   . Heart attack (HCC)    Pt claims he had heart attack a year ago ( 2015)- and was admitted in [facility] for that, but not given any meds or angiogram, on review of chart- I could not find any details like that.  . Hepatitis C, acute may 2016  . Hypertension    Pt is well controlled with home medications

## 2016-03-26 NOTE — Progress Notes (Signed)
CSW is familiar with patient. CSW provided patient with Homeless Shelter resources during his 03/18/16 admission. CSW is signing off but is available if a CSW need were to arise.   Woodroe Modehristina Zidan Helget, MSW, LCSW, LCAS-A Clinical Social Worker [phone]

## 2016-03-26 NOTE — Consult Note (Signed)
Discussed patient with Dr. Mechele CollinElliott for continued management during admission.  Per Dr. Mechele CollinElliott, add full serologic work-up to further evaluate significantly elevated LFTs.  Orders placed.  Please see his full consult note for additional information.

## 2016-03-26 NOTE — Consult Note (Signed)
GI Inpatient Consult Note  Reason for Consult:Elevated LFT's, hepatomegaly, abdominal pain.   Attending Requesting Consult:  History of Present Illness: Trevor Brown is a 25 y.o. male with hx of multiple visits to ER and hospital for complications of insulin dependent diabetes, non compliance, cocaine abuse,  Hepatitis C  CT scan suggestive of possible Whipple's disease, celiac disease could also be present.  Past Medical History:  Past Medical History:  Diagnosis Date  . Diabetes mellitus without complication (HCC)   . Heart attack (HCC)    Pt claims he had heart attack a year ago ( 2015)- and was admitted in [facility] for that, but not given any meds or angiogram, on review of chart- I could not find any details like that.  . Hepatitis C, acute may 2016  . Hypertension     Problem List: Patient Active Problem List   Diagnosis Date Noted  . Transaminitis 03/25/2016  . Tobacco abuse 01/10/2016  . Type 1 diabetes mellitus with hyperglycemia (HCC) 12/12/2015  . Cocaine abuse 12/12/2015  . DKA (diabetic ketoacidoses) (HCC) 06/10/2015  . Hidradenitis suppurativa of left axilla   . DKA, type 1 (HCC) 06/06/2015  . Malnutrition of moderate degree (HCC) 04/08/2015  . Hepatitis C 12/13/2014  . Diabetes type 1, uncontrolled (HCC) 12/11/2014  . Major depressive disorder, single episode, mild (HCC)   . Major depression, single episode 12/10/2014  . Diabetes mellitus type 1, uncontrolled (HCC) 12/09/2014  . Homelessness 12/09/2014    Past Surgical History: History reviewed. No pertinent surgical history.  Allergies: Allergies  Allergen Reactions  . Bee Venom Anaphylaxis  . Penicillins Anaphylaxis, Hives and Other (See Comments)    Has patient had a PCN reaction causing immediate rash, facial/tongue/throat swelling, SOB or lightheadedness with hypotension: Yes Has patient had a PCN reaction causing severe rash involving mucus membranes or skin necrosis: No Has patient had a PCN reaction  that required hospitalization No Has patient had a PCN reaction occurring within the last 10 years: Yes If all of the above answers are "NO", then may proceed with Cephalosporin use.  . Ibuprofen Other (See Comments)    Reaction:  GI upset   . Tramadol Hives and Swelling  . Vancomycin Rash and Other (See Comments)    Reaction:  Red man's syndrome     Home Medications: Prescriptions Prior to Admission  Medication Sig Dispense Refill Last Dose  . clindamycin (CLEOCIN) 150 MG capsule Take 3 capsules (450 mg total) by mouth 4 (four) times daily. 120 capsule 0 03/25/2016 at Unknown time  . insulin aspart (NOVOLOG) 100 UNIT/ML injection 0-9 Units, Subcutaneous, 3 times daily before meals & bedtime, First dose on Fri 03/15/16 at 2200 Correction coverage: Sensitive (thin, NPO, renal) CBG < 70: implement hypoglycemia protocol CBG 70 - 120: 0 units CBG 121 - 150: 1 unit CBG 151 - 200: 2 units CBG 201 - 250: 3 units CBG 251 - 300: 5 units CBG 301 - 350: 7 units CBG 351 - 400: 9 units 10 mL 11 03/25/2016 at Unknown time  . insulin aspart protamine- aspart (NOVOLOG MIX 70/30) (70-30) 100 UNIT/ML injection Inject 0.36 mLs (36 Units total) into the skin 2 (two) times daily with a meal. 10 mL 1 03/25/2016 at Unknown time  . oxyCODONE-acetaminophen (PERCOCET/ROXICET) 5-325 MG tablet Take 1 tablet by mouth every 6 (six) hours as needed for severe pain. 30 tablet 0 03/25/2016 at Unknown time   Home medication reconciliation was completed with the patient.   Scheduled  Inpatient Medications:   . clindamycin  450 mg Oral QID  . insulin aspart  0-5 Units Subcutaneous QHS  . insulin aspart  0-9 Units Subcutaneous TID WC  . insulin aspart protamine- aspart  36 Units Subcutaneous BID WC    Continuous Inpatient Infusions:   . sodium chloride 125 mL/hr at 03/26/16 0217    PRN Inpatient Medications:  oxyCODONE-acetaminophen, zolpidem  Family History: family history includes Cirrhosis in his mother; Diabetes  Mellitus II in his maternal grandmother.  The patient's family history is negative for inflammatory bowel disorders, GI malignancy, or solid organ transplantation.  Social History:   reports that he has been smoking Cigarettes.  He has been smoking about 1.50 packs per day. He has never used smokeless tobacco. He reports that he does not drink alcohol or use drugs. The patient denies ETOH, tobacco, or drug use.   Review of Systems: Constitutional: Weight is stable.  Eyes: No changes in vision. ENT: severe teeth damage, sore throat.  GI: see HPI.  Heme/Lymph: No easy bruising.  CV: No chest pain.  GU: No hematuria.  Integumentary: No rashes.  Neuro: No headaches.  Psych: No depression/anxiety. Flat affect, does not make much eye contact. Endocrine: No heat/cold intolerance.  Allergic/Immunologic: No urticaria.  Resp: No cough, SOB.  Musculoskeletal: No joint swelling.    Physical Examination: BP 126/67 (BP Location: Left Arm)   Pulse 69   Temp 97.8 F (36.6 C) (Oral)   Resp (!) 22   Ht 5\' 4"  (1.626 m)   Wt 55 kg (121 lb 3.2 oz)   SpO2 98%   BMI 20.80 kg/m  Gen: NAD, alert and oriented x 4, answers questions with 1-2 words. HEENT: PEERLA, EOMI, severe teeth damage of upper and lower Neck: supple, no JVD or thyromegaly Chest: CTA bilaterally, no wheezes, crackles, or other adventitious sounds CV: RRR, no m/g/c/r Abd: flat, enlarged liver 20cm or more, tender over liver,  Ext: no edema, well perfused with 2+ pulses, Skin: no rash or lesions noted Lymph: no LAD  Data: Lab Results  Component Value Date   WBC 5.2 03/26/2016   HGB 12.3 (L) 03/26/2016   HCT 35.1 (L) 03/26/2016   MCV 87.8 03/26/2016   PLT 292 03/26/2016    Recent Labs Lab 03/25/16 1759 03/26/16 0336  HGB 12.4* 12.3*   Lab Results  Component Value Date   NA 135 03/26/2016   K 4.6 03/26/2016   CL 103 03/26/2016   CO2 23 03/26/2016   BUN 11 03/26/2016   CREATININE 0.76 03/26/2016   Lab Results   Component Value Date   ALT 552 (H) 03/25/2016   AST 588 (H) 03/25/2016   ALKPHOS 302 (H) 03/25/2016   BILITOT 0.4 03/25/2016    Recent Labs Lab 03/25/16 2258  INR 0.94   Assessment/Plan: [name] is a 25 y.o. male with tender enlarged liver and elevated LFT's with hx of hep C, known cocaine abuser, possible meth given teeth damage who also has IDDM with poor control.  Ct suggests abnormal mucosa of small bowel, possible Whipples, possible celiac disease.  Very elevated ALT and AST.   Will check for celiac disease in blood tests, consider EGD for mucosal biopsies, diabetes control.Patient homeless sleeping on park bench.  Recommendations:  Thank you for the consult. Please call with questions or concerns.  Lynnae PrudeELLIOTT, Cassandre Oleksy, MD  ell

## 2016-03-26 NOTE — Progress Notes (Signed)
[facility] [place] at [place]   PATIENT NAME: Trevor Brown    MR#:  [id number]  DATE OF BIRTH:  April 01, 1991  SUBJECTIVE:  CHIEF COMPLAINT:   Chief Complaint  Patient presents with  . Hyperglycemia   The patient is a 25 year old Caucasian male with past medical history significant for history of multiple visits to emergency room and hospital for Diabetes mellitus type 2, noncompliance,  cocaine abuse, hep C, who presents to the hospital with complaints of dry mouth, dehydration, elevated glucose, polyuria. On arrival to the hospital he was noted to have hyperglycemia, elevated transaminases. He complained of abdominal pain, nausea and chills.  Patient is somnolent, responding to questions intermittently, unable to get review of systems Review of Systems  Unable to perform ROS: Mental acuity    VITAL SIGNS: Blood pressure 126/67, pulse 69, temperature 97.8 F (36.6 C), temperature source Oral, resp. rate (!) 22, height 5\' 4"  (1.626 m), weight 55 kg (121 lb 3.2 oz), SpO2 98 %.  PHYSICAL EXAMINATION:   GENERAL:  25 y.o.-year-old patient lying in the bed with no acute distress. Somewhat somnolent, withdrawn, is not willing to engage in conversation, barely answers questions, intermittently follows commands EYES: Pupils equal, round, reactive to light and accommodation. No scleral icterus. Extraocular muscles intact.  HEENT: Head atraumatic, normocephalic. Oropharynx and nasopharynx clear.  NECK:  Supple, no jugular venous distention. No thyroid enlargement, no tenderness.  LUNGS: Normal breath sounds bilaterally, no wheezing, rales,rhonchi or crepitation. No use of accessory muscles of respiration.  CARDIOVASCULAR: S1, S2 normal. No murmurs, rubs, or gallops.  ABDOMEN: Soft, mildly tender in the upper abdomen, some liver enlargement, nondistended. Bowel sounds present. No organomegaly or mass.  EXTREMITIES: No pedal edema, cyanosis, or clubbing.  NEUROLOGIC:  Cranial nerves II through XII are intact. Muscle strength 5/5 in all extremities. Sensation intact. Gait not checked.  PSYCHIATRIC: The patient is somnolent, difficult to assess his orientation, withdrawn   SKIN: No obvious rash, lesion, or ulcer.   ORDERS/RESULTS REVIEWED:   CBC  Recent Labs Lab 03/25/16 1759 03/26/16 0336  WBC 6.8 5.2  HGB 12.4* 12.3*  HCT 36.0* 35.1*  PLT 313 292  MCV 89.2 87.8  MCH 30.8 30.7  MCHC 34.5 35.0  RDW 15.1* 15.0*   ------------------------------------------------------------------------------------------------------------------  Chemistries   Recent Labs Lab 03/20/16 0548 03/20/16 1731 03/25/16 1759 03/26/16 0336 03/26/16 0811  NA  --   --  134* 135  --   K  --   --  3.7 4.6  --   CL  --   --  100* 103  --   CO2  --   --  22 23  --   GLUCOSE 408* 493* 377* 320*  --   BUN  --   --  19 11  --   CREATININE  --   --  0.85 0.76  --   CALCIUM  --   --  9.3 8.9  --   MG  --   --   --  1.8  --   AST  --   --  588*  --  644*  ALT  --   --  552*  --  606*  ALKPHOS  --   --  302*  --  291*  BILITOT  --   --  0.4  --  0.6   ------------------------------------------------------------------------------------------------------------------ estimated creatinine clearance is 111.7 mL/min (by C-G formula based on SCr of 0.8 mg/dL). ------------------------------------------------------------------------------------------------------------------  Recent Labs  03/26/16 0811  TSH 1.088    Cardiac Enzymes  Recent Labs Lab 03/25/16 1759  TROPONINI <0.03   ------------------------------------------------------------------------------------------------------------------ Invalid input(s): POCBNP ---------------------------------------------------------------------------------------------------------------  RADIOLOGY: Ct Abdomen Pelvis W Contrast  Result Date: 03/25/2016 CLINICAL DATA:  Right sided abdominal pain and transaminitis. EXAM: CT  ABDOMEN AND PELVIS WITH CONTRAST TECHNIQUE: Multidetector CT imaging of the abdomen and pelvis was performed using the standard protocol following bolus administration of intravenous contrast. CONTRAST:  100mL ISOVUE-300 IOPAMIDOL (ISOVUE-300) INJECTION 61% COMPARISON:  12/11/2014 FINDINGS: Lower chest: No pulmonary nodules, pleural effusions, or infiltrates. Heart size is normal. No imaged pericardial effusion or significant coronary artery calcifications. Hepatobiliary: The liver is enlarged and homogeneous. No focal liver lesions are identified. The gallbladder is decompressed. Pancreas: Normal appearance. Spleen: Normal appearance. Renal/Adrenal: Normal adrenal glands. Normal appearance of both kidneys. No hydronephrosis or renal mass. Gastrointestinal tract: The stomach is normal in appearance. Small bowel loops show mildly thickened folds but are normal in caliber and wall thickness. There is a large amount of stool throughout nondilated loops of colon. The appendix is well seen and has a normal appearance. Reproductive/Pelvis: Urinary bladder is normal in appearance. Prostate gland and seminal vesicles are normal in appearance. Vascular/Lymphatic: No evidence for aortic aneurysm. Small low-attenuation lymph nodes are identified at the root of the mesentery. Representative node is 1.0 x 1.7 cm on image 32 of series 5. Musculoskeletal/Abdominal wall: Visualized osseous structures have a normal appearance. Abdominal wall is unremarkable. Other: none IMPRESSION: 1. Hepatomegaly without focal lesions. 2. Mildly thickened small bowel folds. 3. Low-attenuation mesenteric adenopathy. 4. Considerations include inflammatory or infectious viral or bacterial processes. Whipple's disease should be considered. Electronically Signed   By: Norva PavlovElizabeth  Brown M.D.   On: 03/25/2016 20:39    EKG:  Orders placed or performed during the hospital encounter of 03/25/16  . ED EKG  . ED EKG  . EKG 12-Lead  . EKG 12-Lead     ASSESSMENT AND PLAN:  Active Problems:   Transaminitis #1. Elevated transaminases level of unclear etiology, awaiting for hepatitis panel, celiac disease panel, gastroenterology consultation is requested, appreciated, patient claims he is not alcoholic #2. Diffuse Abdominal pain of unclear etiology, questionable related to liver disease, questionable celiac disease, supportive therapy, no opiates #3. Diabetes mellitus, insulin-dependent, type I, continue home management, sliding scale insulin, blood glucose is ranging between 170-390 #4. Anemia, get Hemoccult #5. Altered mental status of unclear etiology in person with substance abuse problems, discontinue Ambien and opiates, supportive therapy Management plans discussed with the patient, family and they are in agreement.   DRUG ALLERGIES:  Allergies  Allergen Reactions  . Bee Venom Anaphylaxis  . Penicillins Anaphylaxis, Hives and Other (See Comments)    Has patient had a PCN reaction causing immediate rash, facial/tongue/throat swelling, SOB or lightheadedness with hypotension: Yes Has patient had a PCN reaction causing severe rash involving mucus membranes or skin necrosis: No Has patient had a PCN reaction that required hospitalization No Has patient had a PCN reaction occurring within the last 10 years: Yes If all of the above answers are "NO", then may proceed with Cephalosporin use.  . Ibuprofen Other (See Comments)    Reaction:  GI upset   . Tramadol Hives and Swelling  . Vancomycin Rash and Other (See Comments)    Reaction:  Red man's syndrome     CODE STATUS:     Code Status Orders        Start     Ordered   03/26/16  1610  Full code  Continuous     03/26/16 0118    Code Status History    Date Active Date Inactive Code Status Order ID Comments User Context   03/18/2016  8:15 AM 03/21/2016  8:35 PM Full Code [id number]  Wyatt Haste, MD ED   03/12/2016 12:20 PM 03/16/2016  7:27 PM Full Code [id number]  Enedina Finner,  MD Inpatient   02/18/2016  4:59 PM 02/19/2016  5:18 PM Full Code [id number]  Ramonita Lab, MD ED   01/12/2016  8:58 AM 01/15/2016  7:33 PM Full Code [id number]  Milagros Loll, MD ED   01/11/2016 12:35 AM 01/11/2016 11:20 PM Full Code [id number]  Gery Pray, MD Inpatient   12/12/2015 10:04 PM 12/14/2015  6:38 PM Full Code [id number]  Katharina Caper, MD Inpatient   10/23/2015  5:42 PM 10/25/2015  7:04 PM Full Code [id number]  Katha Hamming, MD ED   06/10/2015  8:20 AM 06/12/2015  2:41 PM Full Code [id number]  Arnaldo Natal, MD Inpatient   06/06/2015  8:43 PM 06/07/2015  4:20 PM Full Code [id number]  Enedina Finner, MD Inpatient   04/07/2015 12:45 PM 04/08/2015  8:37 PM Full Code [id number]  Altamese Dilling, MD Inpatient   12/09/2014  2:44 PM 12/13/2014  4:16 PM Full Code [id number]  Gale Journey, MD Inpatient      TOTAL TIME TAKING CARE OF THIS PATIENT: 40 minutes.    Katharina Caper M.D on 03/26/2016 at 11:02 AM  Between 7am to 6pm - Pager - [phone]  After 6pm go to [email or web address] - password [email or web address]  [facility]  Office  [phone]  CC: Primary care physician; No PCP Per Patient

## 2016-03-27 ENCOUNTER — Ambulatory Visit: Payer: Self-pay | Admitting: Internal Medicine

## 2016-03-27 DIAGNOSIS — D649 Anemia, unspecified: Secondary | ICD-10-CM

## 2016-03-27 DIAGNOSIS — E109 Type 1 diabetes mellitus without complications: Secondary | ICD-10-CM

## 2016-03-27 DIAGNOSIS — Z716 Tobacco abuse counseling: Secondary | ICD-10-CM

## 2016-03-27 DIAGNOSIS — R7401 Elevation of levels of liver transaminase levels: Secondary | ICD-10-CM

## 2016-03-27 DIAGNOSIS — R740 Nonspecific elevation of levels of transaminase and lactic acid dehydrogenase [LDH]: Secondary | ICD-10-CM

## 2016-03-27 DIAGNOSIS — R1084 Generalized abdominal pain: Secondary | ICD-10-CM

## 2016-03-27 LAB — HEPATITIS PANEL, ACUTE
HCV Ab: >11 s/co ratio — ABNORMAL HIGH (ref 0.0–0.9)
Hep A IgM: NEGATIVE
Hep B C IgM: NEGATIVE
Hepatitis B Surface Ag: NEGATIVE

## 2016-03-27 LAB — ANTINUCLEAR ANTIBODIES, IFA: ANA Ab, IFA: NEGATIVE

## 2016-03-27 LAB — HEPATIC FUNCTION PANEL
ALT: 574 U/L — ABNORMAL HIGH (ref 17–63)
AST: 464 U/L — ABNORMAL HIGH (ref 15–41)
Albumin: 3.8 g/dL (ref 3.5–5.0)
Alkaline Phosphatase: 296 U/L — ABNORMAL HIGH (ref 38–126)
Bilirubin, Direct: <0.1 mg/dL — ABNORMAL LOW (ref 0.1–0.5)
Total Bilirubin: 0.5 mg/dL (ref 0.3–1.2)
Total Protein: 7.3 g/dL (ref 6.5–8.1)

## 2016-03-27 LAB — GLIADIN ANTIBODIES, SERUM
Gliadin IgA: 3 units (ref 0–19)
Gliadin IgG: 1 units (ref 0–19)

## 2016-03-27 LAB — TISSUE TRANSGLUTAMINASE, IGA: Tissue Transglutaminase Ab, IgA: <2 U/mL (ref 0–3)

## 2016-03-27 LAB — ANTI-SMOOTH MUSCLE ANTIBODY, IGG: F-Actin IgG: 13 Units (ref 0–19)

## 2016-03-27 LAB — CERULOPLASMIN: Ceruloplasmin: 35 mg/dL — ABNORMAL HIGH (ref 16.0–31.0)

## 2016-03-27 LAB — GLUCOSE, CAPILLARY
Glucose-Capillary: 367 mg/dL — ABNORMAL HIGH (ref 65–99)
Glucose-Capillary: 190 mg/dL — ABNORMAL HIGH (ref 65–99)

## 2016-03-27 LAB — MITOCHONDRIAL ANTIBODIES: Mitochondrial M2 Ab, IgG: 6.8 Units (ref 0.0–20.0)

## 2016-03-27 MED ORDER — NICOTINE 21 MG/24HR TD PT24: 21.0000 mg | MEDICATED_PATCH | Freq: Every day | TRANSDERMAL | 0 refills | Status: DC

## 2016-03-27 MED ORDER — INSULIN ASPART PROT & ASPART (70-30 MIX) 100 UNIT/ML ~~LOC~~ SUSP: 40.0000 [IU] | Freq: Two times a day (BID) | SUBCUTANEOUS | 1 refills | Status: DC

## 2016-03-27 MED ORDER — NICOTINE 21 MG/24HR TD PT24
21.0000 mg | MEDICATED_PATCH | Freq: Every day | TRANSDERMAL | Status: DC
  Administered 2016-03-27: 21 mg via TRANSDERMAL
  Filled 2016-03-27: qty 1

## 2016-03-27 MED ORDER — OXYCODONE HCL 5 MG PO TABS: 5.0000 mg | ORAL_TABLET | Freq: Four times a day (QID) | ORAL | 0 refills | Status: DC | PRN

## 2016-03-27 NOTE — Progress Notes (Signed)
Inpatient Diabetes Program Recommendations  AACE/ADA: New Consensus Statement on Inpatient Glycemic Control (2015)  Target Ranges:  Prepandial:   less than 140 mg/dL      Peak postprandial:   less than 180 mg/dL (1-2 hours)      Critically ill patients:  140 - 180 mg/dL   Results for [name], Ketih B (MRN [id number]) as of 03/27/2016 08:20  Ref. Range 03/26/2016 07:46 03/26/2016 12:03 03/26/2016 17:11 03/26/2016 20:07 03/27/2016 07:37  Glucose-Capillary Latest Ref Range: 65 - 99 mg/dL 629393 (H) 528198 (H) 413330 (H) 210 (H) 367 (H)   Review of Glycemic Control  Diabetes history: DM1 Outpatient Diabetes medications: 70/30 36 units BID, Novolog 0-9 units ACHS Current orders for Inpatient glycemic control: 70/30 36 units BID, Novolog 0-9 units TID with meals, Novolog 0-5 units QHS  Inpatient Diabetes Program Recommendations: Insulin - Basal: Please consider increasing 70/30 to 40 units BID.   NOTE: NURSING: Please do not administer any insulin injections in patient's arms. Only use abdomen (rotating injection sites) for insulin administration while inpatient.  Thanks, Orlando PennerMarie Kay Ricciuti, RN, MSN, CDE Diabetes Coordinator Inpatient Diabetes Program [phone] (Team Pager from 8am to 5pm) [phone] ([facility] office) [phone] [facility] office) [phone] [facility] office)

## 2016-03-27 NOTE — Clinical Social Work Note (Signed)
Clinical Social Work Assessment  Patient Details  Name: Trevor Brown MRN: [id number] Date of Birth: 1990/09/17  Date of referral:  03/27/16               Reason for consult:  Discharge Planning                Permission sought to share information with:    Permission granted to share information::     Name::        Agency::     Relationship::     Contact Information:     Housing/Transportation Living arrangements for the past 2 months:  [place] of Information:  Patient Patient Interpreter Needed:  None Criminal Activity/Legal Involvement Pertinent to Current Situation/Hospitalization:  No - Comment as needed Significant Relationships:  Parents Lives with:  Parents Do you feel safe going back to the place where you live?  Yes Need for family participation in patient care:  No (Coment)  Care giving concerns:  Patient is homeless.   Social Worker assessment / plan:  CSW is familiar with patient. CSW met with patient. Introduced herself and her role. Provided patient with the following resources: [facility] List; [place] to Assistance; [place]; [employer]; Health Center Locations; Free Syphilis & Hepatitis C Testing; Free No- Needle HIV testing, [address] [employer]; Advanced Access [facility] information;[place] AA Meeting List;[place] Support Meeting brochure; and [place] and [place] list. Encouraged patient to use resources especially the [employer] guide CSW is signing off there are no other CSW needs to address at this time.  Employment status:  Unemployed Forensic scientist:  Self Pay (Medicaid Pending) PT Recommendations:  Not assessed at this time Information / Referral to community resources:   ([place])  Patient/Family's Response to care:  Patient  accepted resources.   Patient/Family's Understanding of and Emotional Response to Diagnosis, Current Treatment, and Prognosis:  Patient does not have a clear understanding of the severity of his medical diagnosis, current treatment and prognosis.   Emotional Assessment Appearance:  Appears stated age Attitude/Demeanor/Rapport:   (None) Affect (typically observed):  Calm Orientation:  Oriented to Self, Oriented to Place, Oriented to  Time, Oriented to Situation Alcohol / Substance use:  Illicit Drugs Psych involvement (Current and /or in the community):  No (Comment)  Discharge Needs  Concerns to be addressed:  Mental Health Concerns, Medication Concerns, Substance Abuse Concerns, Homelessness Readmission within the last 30 days:  Yes Current discharge risk:  None Barriers to Discharge:  No Barriers Identified   [place], LCSW 03/27/2016, 12:13 PM

## 2016-03-27 NOTE — Discharge Summary (Addendum)
[facility] - [place] [facility]   PATIENT NAME: Trevor Brown    MR#:  [id number]  DATE OF BIRTH:  May 19, 1991  DATE OF ADMISSION:  03/25/2016 ADMITTING PHYSICIAN: Jon Gills Hugelmeyer, DO  DATE OF DISCHARGE: No discharge date for patient encounter.  PRIMARY CARE PHYSICIAN: No PCP Per Patient     ADMISSION DIAGNOSIS:  Abdominal pain, right upper quadrant [R10.11] Dehydration [E86.0] Hyperglycemia [R73.9] Elevated LFTs [R79.89]  DISCHARGE DIAGNOSIS:  Active Problems:   Transaminitis   Elevated transaminase level   Diffuse abdominal pain   Diabetes mellitus type 1 (HCC)   Tobacco abuse counseling   Anemia   SECONDARY DIAGNOSIS:   Past Medical History:  Diagnosis Date  . Diabetes mellitus without complication (HCC)   . Heart attack (HCC)    Pt claims he had heart attack a year ago ( 2015)- and was admitted in [facility] for that, but not given any meds or angiogram, on review of chart- I could not find any details like that.  . Hepatitis C, acute may 2016  . Hypertension     .pro HOSPITAL COURSE:  The patient is a 25 year old Caucasian male with past medical history significant for history of multiple visits to emergency room and hospital for Diabetes mellitus type 2, noncompliance,  cocaine abuse, hep C, who presents to the hospital with complaints of dry mouth, dehydration, elevated glucose, polyuria. On arrival to the hospital he was noted to have hyperglycemia, elevated transaminases. He complained of abdominal pain, nausea and chills. Patient was seen by Dr. Dalene Carrow, gastroenterologist who ordered laboratory tests, including ceruloplasmin which was slightly elevated at 35, alpha antitrypsin was pending, hepatitis A was negative, as well as hepatitis B, however, hepatitis C antibodies were high at 11. The patient had abdominal CT scan done prior to admission, revealing hepatomegaly without focal lesions, mildly thickened small bowel folds, low attenuation,  mesenteric adenopathy, possible inflammatory or infectious viral or bacterial process, we'll bolus disease should be considered, per radiologist. Celiac disease levels were ordered, however, results were not obtained prior to discharge from the hospital. Patient's liver enzymes were followed while he was in the hospital, and they were coming down. This conservative therapy. Patient was advised to follow up with gastroenterologist, Dr. Mechele Collin as outpatient. Discussion by problem:   #1. Elevated transaminases level of unclear etiology, hepatitis panel revealed negativity for hepatitis B and A, positive for hepatitis C, which was already known, pending celiac disease panel, gastroenterology consultation was obtained and appreciated , patient denied any alcohol abuse , ceruloplasmin level was found to be slightly elevated. Repeated study is pending. Patient is to follow up with gastroenterologist as outpatient to repeat these labs and a follow-up lab results. #2. Diffuse Abdominal pain of unclear etiology, questionable related to liver disease, questionable celiac disease, supportive therapy, continue low-dose opiates , follow-up with Dr. Mechele Collin for recommendations #3. Diabetes mellitus, insulin-dependent, type I, advanced insulin 70/30 to 40 units twice a day by diabetic coordinator's recommendations, continue sliding scale insulin #4. Anemia, Hemoccult is ordered, not obtained #5. Altered mental status likely due to opiates and Ambien, resolved by the day of discharge when medications were discontinued.  #6. Tobacco abuse. Counseling, discussed this patient for 3 minutes, nicotine replacement therapy will be initiated #7. Dental caries. Patient needs to follow-up with dentist ASAP DISCHARGE CONDITIONS:   Stable  CONSULTS OBTAINED:    DRUG ALLERGIES:   Allergies  Allergen Reactions  . Bee Venom Anaphylaxis  . Penicillins Anaphylaxis, Hives and Other (  See Comments)    Has patient had a PCN  reaction causing immediate rash, facial/tongue/throat swelling, SOB or lightheadedness with hypotension: Yes Has patient had a PCN reaction causing severe rash involving mucus membranes or skin necrosis: No Has patient had a PCN reaction that required hospitalization No Has patient had a PCN reaction occurring within the last 10 years: Yes If all of the above answers are "NO", then may proceed with Cephalosporin use.  . Ibuprofen Other (See Comments)    Reaction:  GI upset   . Tramadol Hives and Swelling  . Vancomycin Rash and Other (See Comments)    Reaction:  Red man's syndrome     DISCHARGE MEDICATIONS:   Current Discharge Medication List    START taking these medications   Details  nicotine (NICODERM CQ - DOSED IN MG/24 HOURS) 21 mg/24hr patch Place 1 patch (21 mg total) onto the skin daily. Qty: 28 patch, Refills: 0    oxyCODONE (OXY IR/ROXICODONE) 5 MG immediate release tablet Take 1 tablet (5 mg total) by mouth 4 (four) times daily as needed (moderate and severe pain). Qty: 30 tablet, Refills: 0      CONTINUE these medications which have CHANGED   Details  insulin aspart protamine- aspart (NOVOLOG MIX 70/30) (70-30) 100 UNIT/ML injection Inject 0.4 mLs (40 Units total) into the skin 2 (two) times daily with a meal. Qty: 10 mL, Refills: 1      CONTINUE these medications which have NOT CHANGED   Details  insulin aspart (NOVOLOG) 100 UNIT/ML injection 0-9 Units, Subcutaneous, 3 times daily before meals & bedtime, First dose on Fri 03/15/16 at 2200 Correction coverage: Sensitive (thin, NPO, renal) CBG < 70: implement hypoglycemia protocol CBG 70 - 120: 0 units CBG 121 - 150: 1 unit CBG 151 - 200: 2 units CBG 201 - 250: 3 units CBG 251 - 300: 5 units CBG 301 - 350: 7 units CBG 351 - 400: 9 units Qty: 10 mL, Refills: 11      STOP taking these medications     clindamycin (CLEOCIN) 150 MG capsule      oxyCODONE-acetaminophen (PERCOCET/ROXICET) 5-325 MG tablet           DISCHARGE INSTRUCTIONS:    Patient is to follow-up with Dr. Mechele CollinElliott within 1-2 weeks after discharge  If you experience worsening of your admission symptoms, develop shortness of breath, life threatening emergency, suicidal or homicidal thoughts you must seek medical attention immediately by calling 911 or calling your MD immediately  if symptoms less severe.  You Must read complete instructions/literature along with all the possible adverse reactions/side effects for all the Medicines you take and that have been prescribed to you. Take any new Medicines after you have completely understood and accept all the possible adverse reactions/side effects.   Please note  You were cared for by a hospitalist during your hospital stay. If you have any questions about your discharge medications or the care you received while you were in the hospital after you are discharged, you can call the unit and asked to speak with the hospitalist on call if the hospitalist that took care of you is not available. Once you are discharged, your primary care physician will handle any further medical issues. Please note that NO REFILLS for any discharge medications will be authorized once you are discharged, as it is imperative that you return to your primary care physician (or establish a relationship with a primary care physician if you do not have one)  for your aftercare needs so that they can reassess your need for medications and monitor your lab values.    Today   CHIEF COMPLAINT:   Chief Complaint  Patient presents with  . Hyperglycemia    HISTORY OF PRESENT ILLNESS:  Judah Carchi  is a 25 y.o. male with a known history of multiple visits to emergency room and hospital for Diabetes mellitus type 2, noncompliance,  cocaine abuse, hep C, who presents to the hospital with complaints of dry mouth, dehydration, elevated glucose, polyuria. On arrival to the hospital he was noted to have hyperglycemia, elevated  transaminases. He complained of abdominal pain, nausea and chills. Patient was seen by Dr. Dalene Carrow, gastroenterologist who ordered laboratory tests, including ceruloplasmin which was slightly elevated at 35, alpha antitrypsin was pending, hepatitis A was negative, as well as hepatitis B, however, hepatitis C antibodies were high at 11. The patient had abdominal CT scan done prior to admission, revealing hepatomegaly without focal lesions, mildly thickened small bowel folds, low attenuation, mesenteric adenopathy, possible inflammatory or infectious viral or bacterial process, we'll bolus disease should be considered, per radiologist. Celiac disease levels were ordered, however, results were not obtained prior to discharge from the hospital. Patient's liver enzymes were followed while he was in the hospital, and they were coming down. This conservative therapy. Patient was advised to follow up with gastroenterologist, Dr. Mechele Collin as outpatient. Discussion by problem:   #1. Elevated transaminases level of unclear etiology, hepatitis panel revealed negativity for hepatitis B and A, positive for hepatitis C, which was already known, pending celiac disease panel, gastroenterology consultation was obtained and appreciated , patient denied any alcohol abuse , ceruloplasmin level was found to be slightly elevated. Repeated study is pending. Patient is to follow up with gastroenterologist as outpatient to repeat these labs and a follow-up lab results. #2. Diffuse Abdominal pain of unclear etiology, questionable related to liver disease, questionable celiac disease, supportive therapy, continue low-dose opiates , follow-up with Dr. Mechele Collin for recommendations #3. Diabetes mellitus, insulin-dependent, type I, advanced insulin 70/30 to 40 units twice a day by diabetic coordinator's recommendations, continue sliding scale insulin #4. Anemia, Hemoccult is ordered, not obtained #5. Altered mental status likely due to opiates  and Ambien, resolved by the day of discharge when medications were discontinued.  #6. Tobacco abuse. Counseling, discussed this patient for 3 minutes, nicotine replacement therapy will be initiated #7. Dental caries. Patient needs to follow-up with dentist ASAP   VITAL SIGNS:  Blood pressure 127/76, pulse 91, temperature 98.3 F (36.8 C), temperature source Oral, resp. rate 18, height 5\' 4"  (1.626 m), weight 55 kg (121 lb 3.2 oz), SpO2 99 %.  I/O:    Intake/Output Summary (Last 24 hours) at 03/27/16 1148 Last data filed at 03/27/16 0955  Gross per 24 hour  Intake          1405.42 ml  Output              600 ml  Net           805.42 ml    PHYSICAL EXAMINATION:  GENERAL:  25 y.o.-year-old patient lying in the bed with no acute distress.  EYES: Pupils equal, round, reactive to light and accommodation. No scleral icterus. Extraocular muscles intact.  HEENT: Head atraumatic, normocephalic. Oropharynx and nasopharynx clear.  NECK:  Supple, no jugular venous distention. No thyroid enlargement, no tenderness.  LUNGS: Normal breath sounds bilaterally, no wheezing, rales,rhonchi or crepitation. No use of accessory muscles of respiration.  CARDIOVASCULAR: S1, S2 normal. No murmurs, rubs, or gallops.  ABDOMEN: Soft, non-tender, non-distended. Bowel sounds present. No organomegaly or mass.  EXTREMITIES: No pedal edema, cyanosis, or clubbing.  NEUROLOGIC: Cranial nerves II through XII are intact. Muscle strength 5/5 in all extremities. Sensation intact. Gait not checked.  PSYCHIATRIC: The patient is alert and oriented x 3.  SKIN: No obvious rash, lesion, or ulcer.   DATA REVIEW:   CBC  Recent Labs Lab 03/26/16 0336  WBC 5.2  HGB 12.3*  HCT 35.1*  PLT 292    Chemistries   Recent Labs Lab 03/26/16 0336  03/27/16 0428  NA 135  --   --   K 4.6  --   --   CL 103  --   --   CO2 23  --   --   GLUCOSE 320*  --   --   BUN 11  --   --   CREATININE 0.76  --   --   CALCIUM 8.9  --    --   MG 1.8  --   --   AST  --   < > 464*  ALT  --   < > 574*  ALKPHOS  --   < > 296*  BILITOT  --   < > 0.5  < > = values in this interval not displayed.  Cardiac Enzymes  Recent Labs Lab 03/25/16 1759  TROPONINI <0.03    Microbiology Results  Results for orders placed or performed during the hospital encounter of 03/12/16  MRSA PCR Screening     Status: None   Collection Time: 03/12/16 12:30 PM  Result Value Ref Range Status   MRSA by PCR NEGATIVE NEGATIVE Final    Comment:        The GeneXpert MRSA Assay (FDA approved for NASAL specimens only), is one component of a comprehensive MRSA colonization surveillance program. It is not intended to diagnose MRSA infection nor to guide or monitor treatment for MRSA infections.     RADIOLOGY:  Ct Abdomen Pelvis W Contrast  Result Date: 03/25/2016 CLINICAL DATA:  Right sided abdominal pain and transaminitis. EXAM: CT ABDOMEN AND PELVIS WITH CONTRAST TECHNIQUE: Multidetector CT imaging of the abdomen and pelvis was performed using the standard protocol following bolus administration of intravenous contrast. CONTRAST:  ISOVUE-300 IOPAMIDOL (ISOVUE-300) INJECTION 61% COMPARISON:  12/11/2014 FINDINGS: Lower chest: No pulmonary nodules, pleural effusions, or infiltrates. Heart size is normal. No imaged pericardial effusion or significant coronary artery calcifications. Hepatobiliary: The liver is enlarged and homogeneous. No focal liver lesions are identified. The gallbladder is decompressed. Pancreas: Normal appearance. Spleen: Normal appearance. Renal/Adrenal: Normal adrenal glands. Normal appearance of both kidneys. No hydronephrosis or renal mass. Gastrointestinal tract: The stomach is normal in appearance. Small bowel loops show mildly thickened folds but are normal in caliber and wall thickness. There is a large amount of stool throughout nondilated loops of colon. The appendix is well seen and has a normal appearance.  Reproductive/Pelvis: Urinary bladder is normal in appearance. Prostate gland and seminal vesicles are normal in appearance. Vascular/Lymphatic: No evidence for aortic aneurysm. Small low-attenuation lymph nodes are identified at the root of the mesentery. Representative node is 1.0 x 1.7 cm on image 32 of series 5. Musculoskeletal/Abdominal wall: Visualized osseous structures have a normal appearance. Abdominal wall is unremarkable. Other: none IMPRESSION: 1. Hepatomegaly without focal lesions. 2. Mildly thickened small bowel folds. 3. Low-attenuation mesenteric adenopathy. 4. Considerations include inflammatory or infectious viral or bacterial processes.  Whipple's disease should be considered. Electronically Signed   By: Norva Pavlov M.D.   On: 03/25/2016 20:39    EKG:   Orders placed or performed during the hospital encounter of 03/25/16  . ED EKG  . ED EKG  . EKG 12-Lead  . EKG 12-Lead      Management plans discussed with the patient, family and they are in agreement.  CODE STATUS:     Code Status Orders        Start     Ordered   03/26/16 0119  Full code  Continuous     03/26/16 0118    Code Status History    Date Active Date Inactive Code Status Order ID Comments User Context   03/18/2016  8:15 AM 03/21/2016  8:35 PM Full Code [id number]  Wyatt Haste, MD ED   03/12/2016 12:20 PM 03/16/2016  7:27 PM Full Code [id number]  Enedina Finner, MD Inpatient   02/18/2016  4:59 PM 02/19/2016  5:18 PM Full Code [id number]  Ramonita Lab, MD ED   01/12/2016  8:58 AM 01/15/2016  7:33 PM Full Code [id number]  Milagros Loll, MD ED   01/11/2016 12:35 AM 01/11/2016 11:20 PM Full Code [id number]  Gery Pray, MD Inpatient   12/12/2015 10:04 PM 12/14/2015  6:38 PM Full Code [id number]  Katharina Caper, MD Inpatient   10/23/2015  5:42 PM 10/25/2015  7:04 PM Full Code [id number]  Katha Hamming, MD ED   06/10/2015  8:20 AM 06/12/2015  2:41 PM Full Code [id number]  Arnaldo Natal, MD Inpatient   06/06/2015  8:43  PM 06/07/2015  4:20 PM Full Code [id number]  Enedina Finner, MD Inpatient   04/07/2015 12:45 PM 04/08/2015  8:37 PM Full Code [id number]  Altamese Dilling, MD Inpatient   12/09/2014  2:44 PM 12/13/2014  4:16 PM Full Code [id number]  Gale Journey, MD Inpatient      TOTAL TIME TAKING CARE OF THIS PATIENT: 40  minutes.    Katharina Caper M.D on 03/27/2016 at 11:48 AM  Between 7am to 6pm - Pager - [phone]  After 6pm go to [email or web address] - password [email or web address] [facility]  [place]  [facility]  Office  [phone]  CC: Primary care physician; No PCP Per Patient

## 2016-03-27 NOTE — Progress Notes (Addendum)
Patient is alert and oriented x 4, c/o abdominal/ mouth pain improved with prn narcotic, pt is up in room independently, on room air, resting quietly in between rounds, great appetite. Pt is discharged, appt with GI scheduled for patient, [facility], patient is d/c with rx for insulin, roxicodone, and nicotine patch. CSW consulted with patient and provided patient with outpatient resources. Pt verbalizes understanding of d/c instructions and reports that transportation will be provided via father, information on homeless shelter given to patient by CSW. Patient seems uninterested in education and verbalizes that he has no further questions at this time.    Patient left between 15:00 and 15:43 without notifying staff as instructed.

## 2016-03-28 LAB — RETICULIN ANTIBODIES, IGA W TITER: Reticulin Ab, IgA: NEGATIVE titer (ref <2.5)

## 2016-03-28 LAB — CERULOPLASMIN: Ceruloplasmin: 35.2 mg/dL — ABNORMAL HIGH (ref 16.0–31.0)

## 2016-03-29 LAB — HEPATITIS B VIRUS (PROFILE VI)
Hep B C IgM: NEGATIVE
Hep B Core Total Ab: NEGATIVE
Hep B E Ab: NEGATIVE
Hep B E Ag: NEGATIVE
Hep B S Ab: REACTIVE
Hepatitis B Surface Ag: NEGATIVE

## 2016-03-29 LAB — ALPHA-1 ANTITRYPSIN PHENOTYPE: A-1 Antitrypsin, Ser: 120 mg/dL (ref 90–200)

## 2016-04-03 ENCOUNTER — Ambulatory Visit: Payer: Self-pay | Admitting: Internal Medicine

## 2016-04-05 ENCOUNTER — Emergency Department
Admission: EM | Admit: 2016-04-05 | Discharge: 2016-04-05 | Disposition: A | Discharge status: Home / Self Care | Payer: Medicaid Other | Attending: Student in an Organized Health Care Education/Training Program | Admitting: Student in an Organized Health Care Education/Training Program

## 2016-04-05 ENCOUNTER — Encounter: Payer: Self-pay | Admitting: Emergency Medicine

## 2016-04-05 DIAGNOSIS — E1065 Type 1 diabetes mellitus with hyperglycemia: Secondary | ICD-10-CM | POA: Insufficient documentation

## 2016-04-05 DIAGNOSIS — R739 Hyperglycemia, unspecified: Secondary | ICD-10-CM

## 2016-04-05 DIAGNOSIS — F1910 Other psychoactive substance abuse, uncomplicated: Secondary | ICD-10-CM

## 2016-04-05 DIAGNOSIS — I10 Essential (primary) hypertension: Secondary | ICD-10-CM | POA: Insufficient documentation

## 2016-04-05 DIAGNOSIS — L02413 Cutaneous abscess of right upper limb: Secondary | ICD-10-CM | POA: Insufficient documentation

## 2016-04-05 DIAGNOSIS — F17210 Nicotine dependence, cigarettes, uncomplicated: Secondary | ICD-10-CM | POA: Insufficient documentation

## 2016-04-05 DIAGNOSIS — L0291 Cutaneous abscess, unspecified: Secondary | ICD-10-CM

## 2016-04-05 DIAGNOSIS — Z79899 Other long term (current) drug therapy: Secondary | ICD-10-CM | POA: Insufficient documentation

## 2016-04-05 LAB — URINALYSIS COMPLETE WITH MICROSCOPIC (ARMC ONLY)
Bacteria, UA: NONE SEEN
Bilirubin Urine: NEGATIVE
Glucose, UA: >500 mg/dL — AB
Hgb urine dipstick: NEGATIVE
Leukocytes, UA: NEGATIVE
Nitrite: NEGATIVE
Protein, ur: NEGATIVE mg/dL
RBC / HPF: NONE SEEN RBC/hpf (ref 0–5)
Specific Gravity, Urine: 1.027 (ref 1.005–1.030)
Squamous Epithelial / LPF: NONE SEEN
WBC, UA: NONE SEEN WBC/hpf (ref 0–5)
pH: 6 (ref 5.0–8.0)

## 2016-04-05 LAB — URINE DRUG SCREEN, QUALITATIVE (ARMC ONLY)
Amphetamines, Ur Screen: NOT DETECTED
Barbiturates, Ur Screen: NOT DETECTED
Benzodiazepine, Ur Scrn: NOT DETECTED
Cannabinoid 50 Ng, Ur ~~LOC~~: POSITIVE — AB
Cocaine Metabolite,Ur ~~LOC~~: NOT DETECTED
MDMA (Ecstasy)Ur Screen: NOT DETECTED
Methadone Scn, Ur: NOT DETECTED
Opiate, Ur Screen: POSITIVE — AB
Phencyclidine (PCP) Ur S: NOT DETECTED
Tricyclic, Ur Screen: NOT DETECTED

## 2016-04-05 LAB — CBC
HCT: 36.4 % — ABNORMAL LOW (ref 40.0–52.0)
Hemoglobin: 12.6 g/dL — ABNORMAL LOW (ref 13.0–18.0)
MCH: 30.9 pg (ref 26.0–34.0)
MCHC: 34.6 g/dL (ref 32.0–36.0)
MCV: 89.4 fL (ref 80.0–100.0)
Platelets: 260 10*3/uL (ref 150–440)
RBC: 4.07 MIL/uL — ABNORMAL LOW (ref 4.40–5.90)
RDW: 15.3 % — ABNORMAL HIGH (ref 11.5–14.5)
WBC: 9.1 10*3/uL (ref 3.8–10.6)

## 2016-04-05 LAB — COMPREHENSIVE METABOLIC PANEL
ALT: 294 U/L — ABNORMAL HIGH (ref 17–63)
AST: 189 U/L — ABNORMAL HIGH (ref 15–41)
Albumin: 4.1 g/dL (ref 3.5–5.0)
Alkaline Phosphatase: 332 U/L — ABNORMAL HIGH (ref 38–126)
Anion gap: 12 (ref 5–15)
BUN: 22 mg/dL — ABNORMAL HIGH (ref 6–20)
CO2: 24 mmol/L (ref 22–32)
Calcium: 9.5 mg/dL (ref 8.9–10.3)
Chloride: 96 mmol/L — ABNORMAL LOW (ref 101–111)
Creatinine, Ser: 0.83 mg/dL (ref 0.61–1.24)
GFR calc Af Amer: >60 mL/min (ref >60)
GFR calc non Af Amer: >60 mL/min (ref >60)
Glucose, Bld: 489 mg/dL — ABNORMAL HIGH (ref 65–99)
Potassium: 4.8 mmol/L (ref 3.5–5.1)
Sodium: 132 mmol/L — ABNORMAL LOW (ref 135–145)
Total Bilirubin: 1.1 mg/dL (ref 0.3–1.2)
Total Protein: 8.1 g/dL (ref 6.5–8.1)

## 2016-04-05 LAB — GLUCOSE, CAPILLARY
Glucose-Capillary: 489 mg/dL — ABNORMAL HIGH (ref 65–99)
Glucose-Capillary: 437 mg/dL — ABNORMAL HIGH (ref 65–99)
Glucose-Capillary: 242 mg/dL — ABNORMAL HIGH (ref 65–99)

## 2016-04-05 LAB — BLOOD GAS, VENOUS
Acid-base deficit: 1.3 mmol/L (ref 0.0–2.0)
Bicarbonate: 23.7 mmol/L (ref 20.0–28.0)
O2 Saturation: 82.5 %
Patient temperature: 37
pCO2, Ven: 40 mmHg — ABNORMAL LOW (ref 44.0–60.0)
pH, Ven: 7.38 (ref 7.250–7.430)
pO2, Ven: 48 mmHg — ABNORMAL HIGH (ref 32.0–45.0)

## 2016-04-05 MED ORDER — SULFAMETHOXAZOLE-TRIMETHOPRIM 800-160 MG PO TABS: 1.0000 | ORAL_TABLET | Freq: Two times a day (BID) | ORAL | 0 refills | Status: DC

## 2016-04-05 MED ORDER — BUPIVACAINE HCL (PF) 0.5 % IJ SOLN
30.0000 mL | Freq: Once | INTRAMUSCULAR | Status: AC
  Administered 2016-04-05: 30 mL
  Filled 2016-04-05: qty 30

## 2016-04-05 MED ORDER — INSULIN ASPART 100 UNIT/ML ~~LOC~~ SOLN
10.0000 [IU] | Freq: Once | SUBCUTANEOUS | Status: AC
  Administered 2016-04-05: 10 [IU] via INTRAVENOUS
  Filled 2016-04-05: qty 10

## 2016-04-05 MED ORDER — SULFAMETHOXAZOLE-TRIMETHOPRIM 800-160 MG PO TABS
1.0000 | ORAL_TABLET | Freq: Once | ORAL | Status: AC
  Administered 2016-04-05: 1 via ORAL
  Filled 2016-04-05: qty 1

## 2016-04-05 MED ORDER — SODIUM CHLORIDE 0.9 % IV BOLUS (SEPSIS)
1000.0000 mL | Freq: Once | INTRAVENOUS | Status: AC
  Administered 2016-04-05: 1000 mL via INTRAVENOUS

## 2016-04-05 NOTE — ED Provider Notes (Signed)
[facility] Emergency Department Provider Note    First MD Initiated Contact with Patient 04/05/16 [phone]     (approximate)  I have reviewed the triage vital signs and the nursing notes.   HISTORY  Chief Complaint Hyperglycemia    HPI Trevor Brown is a 25 y.o. male with history of poorly controlled diabetes as well as hep C and polysubstance abuse presenting to the ER due to concern for elevated blood sugar. States he did have brief episode of nausea and vomiting this morning but denies any abdominal pain at this time. Does admit to persistent marijuana and opiate use including IV heroin use. Denies any fevers or chills. Denies any chest pain or shortness of breath. States that he has been taking his insulin as directed. Does complain of redness and tenderness the right bicep where he did inject some heroin.   Past Medical History:  Diagnosis Date  . Diabetes mellitus without complication (HCC)   . Heart attack (HCC)    Pt claims he had heart attack a year ago ( 2015)- and was admitted in [facility] for that, but not given any meds or angiogram, on review of chart- I could not find any details like that.  . Hepatitis C, acute may 2016  . Hypertension     Patient Active Problem List   Diagnosis Date Noted  . Elevated transaminase level 03/27/2016  . Diffuse abdominal pain 03/27/2016  . Anemia 03/27/2016  . Diabetes mellitus type 1 (HCC) 03/27/2016  . Tobacco abuse counseling 03/27/2016  . Transaminitis 03/25/2016  . Tobacco abuse 01/10/2016  . Type 1 diabetes mellitus with hyperglycemia (HCC) 12/12/2015  . Cocaine abuse 12/12/2015  . DKA (diabetic ketoacidoses) (HCC) 06/10/2015  . Hidradenitis suppurativa of left axilla   . DKA, type 1 (HCC) 06/06/2015  . Malnutrition of moderate degree (HCC) 04/08/2015  . Hepatitis C 12/13/2014  . Diabetes type 1, uncontrolled (HCC) 12/11/2014  . Major depressive disorder, single episode, mild (HCC)   . Major  depression, single episode 12/10/2014  . Diabetes mellitus type 1, uncontrolled (HCC) 12/09/2014  . Homelessness 12/09/2014    History reviewed. No pertinent surgical history.  Prior to Admission medications   Medication Sig Start Date End Date Taking? Authorizing Provider  insulin aspart (NOVOLOG) 100 UNIT/ML injection 0-9 Units, Subcutaneous, 3 times daily before meals & bedtime, First dose on Fri 03/15/16 at 2200 Correction coverage: Sensitive (thin, NPO, renal) CBG < 70: implement hypoglycemia protocol CBG 70 - 120: 0 units CBG 121 - 150: 1 unit CBG 151 - 200: 2 units CBG 201 - 250: 3 units CBG 251 - 300: 5 units CBG 301 - 350: 7 units CBG 351 - 400: 9 units 03/16/16   Wyatt Haste, MD  insulin aspart protamine- aspart (NOVOLOG MIX 70/30) (70-30) 100 UNIT/ML injection Inject 0.4 mLs (40 Units total) into the skin 2 (two) times daily with a meal. 03/27/16   Katharina Caper, MD  nicotine (NICODERM CQ - DOSED IN MG/24 HOURS) 21 mg/24hr patch Place 1 patch (21 mg total) onto the skin daily. 03/27/16   Katharina Caper, MD  oxyCODONE (OXY IR/ROXICODONE) 5 MG immediate release tablet Take 1 tablet (5 mg total) by mouth 4 (four) times daily as needed (moderate and severe pain). 03/27/16   Katharina Caper, MD    Allergies Bee venom; Penicillins; Ibuprofen; Tramadol; and Vancomycin  Family History  Problem Relation Age of Onset  . Cirrhosis Mother   . Diabetes Mellitus II Maternal Grandmother  Social History Social History  Substance Use Topics  . Smoking status: Current Every Day Smoker    Packs/day: 2.00    Types: Cigarettes  . Smokeless tobacco: Never Used  . Alcohol use No    Review of Systems Patient denies headaches, rhinorrhea, blurry vision, numbness, shortness of breath, chest pain, edema, cough, abdominal pain, nausea, vomiting, diarrhea, dysuria, fevers, rashes or hallucinations unless otherwise stated above in HPI. ____________________________________________   PHYSICAL  EXAM:  VITAL SIGNS: Vitals:   04/05/16 0730 04/05/16 0800  BP: 132/79 (!) 128/91  Pulse: 80 72  Resp: 18 (!) 25  Temp:      Constitutional: Alert and oriented. Chronically ill appearing, NAD Eyes: Conjunctivae are normal. PERRL. EOMI. Head: Atraumatic. Nose: No congestion/rhinnorhea. Mouth/Throat: poor dentition. Mucous membranes are moist.  Oropharynx non-erythematous. Neck: No stridor. Painless ROM. No cervical spine tenderness to palpation Hematological/Lymphatic/Immunilogical: No cervical lymphadenopathy. Cardiovascular: Normal rate, regular rhythm. Grossly normal heart sounds.  Good peripheral circulation. Respiratory: Normal respiratory effort.  No retractions. Lungs CTAB. Gastrointestinal: Soft and nontender. No distention. No abdominal bruits. No CVA tenderness. Genitourinary:  Musculoskeletal: No lower extremity tenderness nor edema.  No joint effusions. Neurologic:  Normal speech and language. No gross focal neurologic deficits are appreciated. No gait instability. Skin:  Skin is warm, dry and intact. Track marks notes, area of erythema and induration to right bicep Psychiatric: Mood and affect are normal. Speech and behavior are normal.  ____________________________________________   LABS (all labs ordered are listed, but only abnormal results are displayed)  Results for orders placed or performed during the hospital encounter of 04/05/16 (from the past 24 hour(s))  Glucose, capillary     Status: Abnormal   Collection Time: 04/05/16  6:51 AM  Result Value Ref Range   Glucose-Capillary 489 (H) 65 - 99 mg/dL  CBC     Status: Abnormal   Collection Time: 04/05/16  6:53 AM  Result Value Ref Range   WBC 9.1 3.8 - 10.6 K/uL   RBC 4.07 (L) 4.40 - 5.90 MIL/uL   Hemoglobin 12.6 (L) 13.0 - 18.0 g/dL   HCT 04.5 (L) 40.9 - 81.1 %   MCV 89.4 80.0 - 100.0 fL   MCH 30.9 26.0 - 34.0 pg   MCHC 34.6 32.0 - 36.0 g/dL   RDW 91.4 (H) 78.2 - 95.6 %   Platelets 260 150 - 440 K/uL    Comprehensive metabolic panel     Status: Abnormal   Collection Time: 04/05/16  6:53 AM  Result Value Ref Range   Sodium 132 (L) 135 - 145 mmol/L   Potassium 4.8 3.5 - 5.1 mmol/L   Chloride 96 (L) 101 - 111 mmol/L   CO2 24 22 - 32 mmol/L   Glucose, Bld 489 (H) 65 - 99 mg/dL   BUN 22 (H) 6 - 20 mg/dL   Creatinine, Ser 2.13 0.61 - 1.24 mg/dL   Calcium 9.5 8.9 - 08.6 mg/dL   Total Protein 8.1 6.5 - 8.1 g/dL   Albumin 4.1 3.5 - 5.0 g/dL   AST 578 (H) 15 - 41 U/L   ALT 294 (H) 17 - 63 U/L   Alkaline Phosphatase 332 (H) 38 - 126 U/L   Total Bilirubin 1.1 0.3 - 1.2 mg/dL   GFR calc non Af Amer >60 >60 mL/min   GFR calc Af Amer >60 >60 mL/min   Anion gap 12 5 - 15  Urinalysis complete, with microscopic ([facility] only)     Status: Abnormal  Collection Time: 04/05/16  7:16 AM  Result Value Ref Range   Color, Urine STRAW (A) YELLOW   APPearance CLEAR (A) CLEAR   Glucose, UA >500 (A) NEGATIVE mg/dL   Bilirubin Urine NEGATIVE NEGATIVE   Ketones, ur 1+ (A) NEGATIVE mg/dL   Specific Gravity, Urine 1.027 1.005 - 1.030   Hgb urine dipstick NEGATIVE NEGATIVE   pH 6.0 5.0 - 8.0   Protein, ur NEGATIVE NEGATIVE mg/dL   Nitrite NEGATIVE NEGATIVE   Leukocytes, UA NEGATIVE NEGATIVE   RBC / HPF NONE SEEN 0 - 5 RBC/hpf   WBC, UA NONE SEEN 0 - 5 WBC/hpf   Bacteria, UA NONE SEEN NONE SEEN   Squamous Epithelial / LPF NONE SEEN NONE SEEN  Urine Drug Screen, Qualitative ([facility] only)     Status: Abnormal   Collection Time: 04/05/16  7:16 AM  Result Value Ref Range   Tricyclic, Ur Screen NONE DETECTED NONE DETECTED   Amphetamines, Ur Screen NONE DETECTED NONE DETECTED   MDMA (Ecstasy)Ur Screen NONE DETECTED NONE DETECTED   Cocaine Metabolite,Ur [place] NONE DETECTED NONE DETECTED   Opiate, Ur Screen POSITIVE (A) NONE DETECTED   Phencyclidine (PCP) Ur S NONE DETECTED NONE DETECTED   Cannabinoid 50 Ng, Ur  POSITIVE (A) NONE DETECTED   Barbiturates, Ur Screen NONE DETECTED NONE DETECTED   Benzodiazepine, Ur  Scrn NONE DETECTED NONE DETECTED   Methadone Scn, Ur NONE DETECTED NONE DETECTED  Blood gas, venous     Status: Abnormal   Collection Time: 04/05/16  7:44 AM  Result Value Ref Range   pH, Ven 7.38 7.250 - 7.430   pCO2, Ven 40 (L) 44.0 - 60.0 mmHg   pO2, Ven 48.0 (H) 32.0 - 45.0 mmHg   Bicarbonate 23.7 20.0 - 28.0 mmol/L   Acid-base deficit 1.3 0.0 - 2.0 mmol/L   O2 Saturation 82.5 %   Patient temperature 37.0    Collection site VEIN    Sample type VENOUS   Glucose, capillary     Status: Abnormal   Collection Time: 04/05/16  8:24 AM  Result Value Ref Range   Glucose-Capillary 437 (H) 65 - 99 mg/dL   ____________________________________________ ____________________________________________  RADIOLOGY   ____________________________________________   PROCEDURES  Yes .Marland Kitchen.Incision and Drainage Date/Time: 04/05/2016 3:19 PM Performed by: Willy EddyOBINSON, Latoy Labriola Authorized by: Willy EddyOBINSON, Joyceann Kruser   Consent:    Consent obtained:  Verbal   Consent given by:  Patient Location:    Type:  Abscess   Location:  Upper extremity   Upper extremity location:  Arm   Arm location:  R upper arm Pre-procedure details:    Skin preparation:  Chloraprep Anesthesia (see MAR for exact dosages):    Anesthesia method:  Local infiltration   Local anesthetic:  Bupivacaine 0.5% w/o epi Procedure type:    Complexity:  Simple Procedure details:    Incision types:  Single straight   Incision depth:  Dermal   Scalpel blade:  11   Wound management:  Probed and deloculated   Drainage:  Purulent   Drainage amount:  Moderate   Wound treatment:  Wound left open   Packing materials:  None Post-procedure details:    Patient tolerance of procedure:  Tolerated well, no immediate complications   \    Critical Care performed: no ____________________________________________   INITIAL IMPRESSION / ASSESSMENT AND PLAN / ED COURSE  Pertinent labs & imaging results that were available during my care of the  patient were reviewed by me and considered in my medical  decision making (see chart for details).  DDX: dka, hyperglycemia, cellulitis, abscess  EDWORD CU is a 25 y.o. who presents to the ED with history of IV drug abuse as well as poorly controlled diabetes with frequent admissions to hospital for DKA presents due to concern for elevated sugars as well as painful state area of cellulitis in probable abscess to the right anterior bicep. His exam is otherwise nonfocal and benign. Laboratory evaluation ordered to assess for metabolic abnormality shows she is hyper glycemic without any significant acidosis. He is testing positive for opiates as well as cannabinoids. Patient provided IV fluids as well as IV insulin for his hyperglycemia. Will ultrasound area of fluctuance to evaluate for abscess amenable to I&D.  Clinical Course  Comment By Time  Ultrasound does show fluid collection. We'll perform I&D and start patient on antibiotics. Willy Eddy, MD 09/15 [phone]    Patient tolerated the procedure without complications. Hyperglycemia resolved with IV fluids as well as IV insulin. Patient given resources for drug abuse counseling. Discussed signs and symptoms for which the patient should return to the ER. Will be discharged on antibiotics for surrounding cellulitis.  Have discussed with the patient and available family all diagnostics and treatments performed thus far and all questions were answered to the best of my ability. The patient demonstrates understanding and agreement with plan.    ____________________________________________   FINAL CLINICAL IMPRESSION(S) / ED DIAGNOSES  Final diagnoses:  Hyperglycemia  Abscess  Polysubstance abuse      NEW MEDICATIONS STARTED DURING THIS VISIT:  New Prescriptions   No medications on file     Note:  This document was prepared using Dragon voice recognition software and may include unintentional dictation errors.    Willy Eddy, MD 04/05/16 1520

## 2016-04-05 NOTE — ED Notes (Signed)
Pt abscess drained by MD. Bandaged applied over area.

## 2016-04-05 NOTE — ED Triage Notes (Signed)
Pt stating that he woke up not feeling well today and took his BG and it was 538. Pt stating that he then took 24 units of 70/30 and then called EMS. Pt stating that he is feeling nauseated.

## 2016-04-09 ENCOUNTER — Emergency Department: Payer: Medicaid Other

## 2016-04-09 ENCOUNTER — Encounter: Payer: Self-pay | Admitting: Emergency Medicine

## 2016-04-09 ENCOUNTER — Inpatient Hospital Stay
Admission: EM | Admit: 2016-04-09 | Discharge: 2016-04-11 | DRG: 638 | Disposition: A | Discharge status: Home / Self Care | Payer: Medicaid Other | Attending: Internal Medicine | Admitting: Internal Medicine

## 2016-04-09 DIAGNOSIS — R109 Unspecified abdominal pain: Secondary | ICD-10-CM

## 2016-04-09 DIAGNOSIS — Z880 Allergy status to penicillin: Secondary | ICD-10-CM

## 2016-04-09 DIAGNOSIS — I959 Hypotension, unspecified: Secondary | ICD-10-CM | POA: Diagnosis present

## 2016-04-09 DIAGNOSIS — Z716 Tobacco abuse counseling: Secondary | ICD-10-CM | POA: Diagnosis present

## 2016-04-09 DIAGNOSIS — E871 Hypo-osmolality and hyponatremia: Secondary | ICD-10-CM | POA: Diagnosis present

## 2016-04-09 DIAGNOSIS — R740 Nonspecific elevation of levels of transaminase and lactic acid dehydrogenase [LDH]: Secondary | ICD-10-CM | POA: Diagnosis present

## 2016-04-09 DIAGNOSIS — I2510 Atherosclerotic heart disease of native coronary artery without angina pectoris: Secondary | ICD-10-CM | POA: Diagnosis present

## 2016-04-09 DIAGNOSIS — R7401 Elevation of levels of liver transaminase levels: Secondary | ICD-10-CM | POA: Diagnosis present

## 2016-04-09 DIAGNOSIS — I10 Essential (primary) hypertension: Secondary | ICD-10-CM | POA: Diagnosis present

## 2016-04-09 DIAGNOSIS — K529 Noninfective gastroenteritis and colitis, unspecified: Secondary | ICD-10-CM

## 2016-04-09 DIAGNOSIS — Z91030 Bee allergy status: Secondary | ICD-10-CM

## 2016-04-09 DIAGNOSIS — E1010 Type 1 diabetes mellitus with ketoacidosis without coma: Principal | ICD-10-CM | POA: Diagnosis present

## 2016-04-09 DIAGNOSIS — Z833 Family history of diabetes mellitus: Secondary | ICD-10-CM

## 2016-04-09 DIAGNOSIS — F17210 Nicotine dependence, cigarettes, uncomplicated: Secondary | ICD-10-CM | POA: Diagnosis present

## 2016-04-09 DIAGNOSIS — E860 Dehydration: Secondary | ICD-10-CM | POA: Diagnosis present

## 2016-04-09 DIAGNOSIS — E1110 Type 2 diabetes mellitus with ketoacidosis without coma: Secondary | ICD-10-CM | POA: Diagnosis present

## 2016-04-09 DIAGNOSIS — Z794 Long term (current) use of insulin: Secondary | ICD-10-CM

## 2016-04-09 DIAGNOSIS — B1920 Unspecified viral hepatitis C without hepatic coma: Secondary | ICD-10-CM | POA: Diagnosis present

## 2016-04-09 DIAGNOSIS — F1110 Opioid abuse, uncomplicated: Secondary | ICD-10-CM | POA: Diagnosis present

## 2016-04-09 DIAGNOSIS — D72829 Elevated white blood cell count, unspecified: Secondary | ICD-10-CM

## 2016-04-09 LAB — COMPREHENSIVE METABOLIC PANEL
ALT: 194 U/L — ABNORMAL HIGH (ref 17–63)
AST: 107 U/L — ABNORMAL HIGH (ref 15–41)
Albumin: 4.2 g/dL (ref 3.5–5.0)
Alkaline Phosphatase: 291 U/L — ABNORMAL HIGH (ref 38–126)
Anion gap: 16 — ABNORMAL HIGH (ref 5–15)
BUN: 16 mg/dL (ref 6–20)
CO2: 20 mmol/L — ABNORMAL LOW (ref 22–32)
Calcium: 9.3 mg/dL (ref 8.9–10.3)
Chloride: 93 mmol/L — ABNORMAL LOW (ref 101–111)
Creatinine, Ser: 0.93 mg/dL (ref 0.61–1.24)
GFR calc Af Amer: >60 mL/min (ref >60)
GFR calc non Af Amer: >60 mL/min (ref >60)
Glucose, Bld: 438 mg/dL — ABNORMAL HIGH (ref 65–99)
Potassium: 4.3 mmol/L (ref 3.5–5.1)
Sodium: 129 mmol/L — ABNORMAL LOW (ref 135–145)
Total Bilirubin: 1.5 mg/dL — ABNORMAL HIGH (ref 0.3–1.2)
Total Protein: 8.4 g/dL — ABNORMAL HIGH (ref 6.5–8.1)

## 2016-04-09 LAB — URINE DRUG SCREEN, QUALITATIVE (ARMC ONLY)
Amphetamines, Ur Screen: NOT DETECTED
Barbiturates, Ur Screen: NOT DETECTED
Benzodiazepine, Ur Scrn: NOT DETECTED
Cannabinoid 50 Ng, Ur ~~LOC~~: NOT DETECTED
Cocaine Metabolite,Ur ~~LOC~~: NOT DETECTED
MDMA (Ecstasy)Ur Screen: NOT DETECTED
Methadone Scn, Ur: NOT DETECTED
Opiate, Ur Screen: POSITIVE — AB
Phencyclidine (PCP) Ur S: NOT DETECTED
Tricyclic, Ur Screen: NOT DETECTED

## 2016-04-09 LAB — GLUCOSE, CAPILLARY
Glucose-Capillary: 509 mg/dL (ref 65–99)
Glucose-Capillary: 541 mg/dL (ref 65–99)
Glucose-Capillary: 476 mg/dL — ABNORMAL HIGH (ref 65–99)
Glucose-Capillary: 387 mg/dL — ABNORMAL HIGH (ref 65–99)
Glucose-Capillary: 284 mg/dL — ABNORMAL HIGH (ref 65–99)
Glucose-Capillary: 272 mg/dL — ABNORMAL HIGH (ref 65–99)
Glucose-Capillary: 373 mg/dL — ABNORMAL HIGH (ref 65–99)
Glucose-Capillary: 369 mg/dL — ABNORMAL HIGH (ref 65–99)
Glucose-Capillary: 386 mg/dL — ABNORMAL HIGH (ref 65–99)
Glucose-Capillary: 245 mg/dL — ABNORMAL HIGH (ref 65–99)
Glucose-Capillary: 125 mg/dL — ABNORMAL HIGH (ref 65–99)

## 2016-04-09 LAB — CBC WITH DIFFERENTIAL/PLATELET
Basophils Absolute: 0.1 10*3/uL (ref 0–0.1)
Basophils Relative: 1 %
Eosinophils Absolute: 0.1 10*3/uL (ref 0–0.7)
Eosinophils Relative: 1 %
HCT: 38.4 % — ABNORMAL LOW (ref 40.0–52.0)
Hemoglobin: 13.3 g/dL (ref 13.0–18.0)
Lymphocytes Relative: 17 %
Lymphs Abs: 1.3 10*3/uL (ref 1.0–3.6)
MCH: 30.9 pg (ref 26.0–34.0)
MCHC: 34.7 g/dL (ref 32.0–36.0)
MCV: 89.1 fL (ref 80.0–100.0)
Monocytes Absolute: 0.5 10*3/uL (ref 0.2–1.0)
Monocytes Relative: 6 %
Neutro Abs: 5.7 10*3/uL (ref 1.4–6.5)
Neutrophils Relative %: 75 %
Platelets: 321 10*3/uL (ref 150–440)
RBC: 4.31 MIL/uL — ABNORMAL LOW (ref 4.40–5.90)
RDW: 15.1 % — ABNORMAL HIGH (ref 11.5–14.5)
WBC: 7.6 10*3/uL (ref 3.8–10.6)

## 2016-04-09 LAB — MAGNESIUM: Magnesium: 1.8 mg/dL (ref 1.7–2.4)

## 2016-04-09 LAB — MRSA PCR SCREENING: MRSA by PCR: NEGATIVE

## 2016-04-09 LAB — LIPASE, BLOOD: Lipase: 19 U/L (ref 11–51)

## 2016-04-09 LAB — BLOOD GAS, VENOUS
Acid-base deficit: 3.8 mmol/L — ABNORMAL HIGH (ref 0.0–2.0)
Bicarbonate: 20.7 mmol/L (ref 20.0–28.0)
O2 Saturation: 86.9 %
Patient temperature: 37
pCO2, Ven: 35 mmHg — ABNORMAL LOW (ref 44.0–60.0)
pH, Ven: 7.38 (ref 7.250–7.430)
pO2, Ven: 54 mmHg — ABNORMAL HIGH (ref 32.0–45.0)

## 2016-04-09 LAB — URINALYSIS COMPLETE WITH MICROSCOPIC (ARMC ONLY)
Bacteria, UA: NONE SEEN
Bilirubin Urine: NEGATIVE
Glucose, UA: >500 mg/dL — AB
Hgb urine dipstick: NEGATIVE
Leukocytes, UA: NEGATIVE
Nitrite: NEGATIVE
Protein, ur: NEGATIVE mg/dL
Specific Gravity, Urine: 1.031 — ABNORMAL HIGH (ref 1.005–1.030)
Squamous Epithelial / LPF: NONE SEEN
pH: 5 (ref 5.0–8.0)

## 2016-04-09 LAB — ETHANOL: Alcohol, Ethyl (B): <5 mg/dL (ref <5)

## 2016-04-09 LAB — TROPONIN I: Troponin I: <0.03 ng/mL (ref <0.03)

## 2016-04-09 MED ORDER — ONDANSETRON HCL 4 MG/2ML IJ SOLN
4.0000 mg | Freq: Once | INTRAMUSCULAR | Status: AC
  Administered 2016-04-09: 4 mg via INTRAVENOUS
  Filled 2016-04-09: qty 2

## 2016-04-09 MED ORDER — SODIUM CHLORIDE 0.9 % IV SOLN
INTRAVENOUS | Status: DC
  Administered 2016-04-09: 11.1 [IU]/h via INTRAVENOUS
  Administered 2016-04-09: 4.2 [IU]/h via INTRAVENOUS
  Administered 2016-04-10: 8.7 [IU]/h via INTRAVENOUS
  Filled 2016-04-09: qty 2.5

## 2016-04-09 MED ORDER — DEXTROSE-NACL 5-0.45 % IV SOLN
INTRAVENOUS | Status: DC
Start: 2016-04-09 — End: 2016-04-10
  Administered 2016-04-09 – 2016-04-10 (×2): via INTRAVENOUS

## 2016-04-09 MED ORDER — HYDROCODONE-ACETAMINOPHEN 5-325 MG PO TABS
1.0000 | ORAL_TABLET | ORAL | Status: DC | PRN
  Administered 2016-04-09 – 2016-04-11 (×9): 1 via ORAL
  Filled 2016-04-09 (×9): qty 1

## 2016-04-09 MED ORDER — FENTANYL CITRATE (PF) 100 MCG/2ML IJ SOLN
50.0000 ug | Freq: Once | INTRAMUSCULAR | Status: AC
  Administered 2016-04-09: 50 ug via INTRAVENOUS
  Filled 2016-04-09: qty 2

## 2016-04-09 MED ORDER — SODIUM CHLORIDE 0.9 % IV BOLUS (SEPSIS)
1000.0000 mL | Freq: Once | INTRAVENOUS | Status: AC
  Administered 2016-04-09: 1000 mL via INTRAVENOUS

## 2016-04-09 MED ORDER — ENOXAPARIN SODIUM 40 MG/0.4ML ~~LOC~~ SOLN
40.0000 mg | SUBCUTANEOUS | Status: DC
  Filled 2016-04-09 (×2): qty 0.4

## 2016-04-09 MED ORDER — ONDANSETRON HCL 4 MG/2ML IJ SOLN: 4.0000 mg | Freq: Four times a day (QID) | INTRAMUSCULAR | Status: DC | PRN

## 2016-04-09 MED ORDER — ONDANSETRON HCL 4 MG PO TABS: 4.0000 mg | ORAL_TABLET | Freq: Four times a day (QID) | ORAL | Status: DC | PRN

## 2016-04-09 MED ORDER — SODIUM CHLORIDE 0.9% FLUSH
3.0000 mL | Freq: Two times a day (BID) | INTRAVENOUS | Status: DC
  Administered 2016-04-10 – 2016-04-11 (×3): 3 mL via INTRAVENOUS

## 2016-04-09 MED ORDER — NICOTINE 21 MG/24HR TD PT24
21.0000 mg | MEDICATED_PATCH | Freq: Every day | TRANSDERMAL | Status: DC
  Administered 2016-04-09 – 2016-04-11 (×3): 21 mg via TRANSDERMAL
  Filled 2016-04-09 (×3): qty 1

## 2016-04-09 MED ORDER — ASPIRIN EC 81 MG PO TBEC
81.0000 mg | DELAYED_RELEASE_TABLET | Freq: Every day | ORAL | Status: DC
  Administered 2016-04-09 – 2016-04-11 (×3): 81 mg via ORAL
  Filled 2016-04-09 (×3): qty 1

## 2016-04-09 NOTE — ED Notes (Signed)
Admitting MD in room to assess patient at this time.   

## 2016-04-09 NOTE — ED Notes (Signed)
Patient states, "My sugar has been up and down.  I just don't feel good."

## 2016-04-09 NOTE — ED Notes (Signed)
Called RN bed ready

## 2016-04-09 NOTE — ED Provider Notes (Signed)
[facility] Emergency Department Provider Note  ____________________________________________   I have reviewed the triage vital signs and the nursing notes.   HISTORY  Chief Complaint Abdominal Pain    HPI Trevor Brown is a 25 y.o. male with a history of poorly controlled diabetes, as well as DKA multiple times, as well as unfortunately heroin abuse and hepatitis C presents today complaining ofleft-sided flank pain. Denies nausea or vomiting at this time. He shouldn't somewhat poor story. States he last used heroin 2 days ago. Denies being withdrawal. However would like narcotic pain medication. Her sugar for EMS was in the 160s.     Past Medical History:  Diagnosis Date  . Diabetes mellitus without complication (HCC)   . Heart attack (HCC)    Pt claims he had heart attack a year ago ( 2015)- and was admitted in [facility] for that, but not given any meds or angiogram, on review of chart- I could not find any details like that.  . Hepatitis C, acute may 2016  . Hypertension     Patient Active Problem List   Diagnosis Date Noted  . Elevated transaminase level 03/27/2016  . Diffuse abdominal pain 03/27/2016  . Anemia 03/27/2016  . Diabetes mellitus type 1 (HCC) 03/27/2016  . Tobacco abuse counseling 03/27/2016  . Transaminitis 03/25/2016  . Tobacco abuse 01/10/2016  . Type 1 diabetes mellitus with hyperglycemia (HCC) 12/12/2015  . Cocaine abuse 12/12/2015  . DKA (diabetic ketoacidoses) (HCC) 06/10/2015  . Hidradenitis suppurativa of left axilla   . DKA, type 1 (HCC) 06/06/2015  . Malnutrition of moderate degree (HCC) 04/08/2015  . Hepatitis C 12/13/2014  . Diabetes type 1, uncontrolled (HCC) 12/11/2014  . Major depressive disorder, single episode, mild (HCC)   . Major depression, single episode 12/10/2014  . Diabetes mellitus type 1, uncontrolled (HCC) 12/09/2014  . Homelessness 12/09/2014    History reviewed. No pertinent surgical  history.  Prior to Admission medications   Medication Sig Start Date End Date Taking? Authorizing Provider  insulin aspart protamine- aspart (NOVOLOG MIX 70/30) (70-30) 100 UNIT/ML injection Inject 0.4 mLs (40 Units total) into the skin 2 (two) times daily with a meal. 03/27/16  Yes Katharina Caper, MD  nicotine (NICODERM CQ - DOSED IN MG/24 HOURS) 21 mg/24hr patch Place 1 patch (21 mg total) onto the skin daily. 03/27/16  Yes Katharina Caper, MD  oxyCODONE (OXY IR/ROXICODONE) 5 MG immediate release tablet Take 1 tablet (5 mg total) by mouth 4 (four) times daily as needed (moderate and severe pain). 03/27/16  Yes Katharina Caper, MD  insulin aspart (NOVOLOG) 100 UNIT/ML injection 0-9 Units, Subcutaneous, 3 times daily before meals & bedtime, First dose on Fri 03/15/16 at 2200 Correction coverage: Sensitive (thin, NPO, renal) CBG < 70: implement hypoglycemia protocol CBG 70 - 120: 0 units CBG 121 - 150: 1 unit CBG 151 - 200: 2 units CBG 201 - 250: 3 units CBG 251 - 300: 5 units CBG 301 - 350: 7 units CBG 351 - 400: 9 units Patient not taking: Reported on 04/09/2016 03/16/16   Wyatt Haste, MD  sulfamethoxazole-trimethoprim (BACTRIM DS,SEPTRA DS) 800-160 MG tablet Take 1 tablet by mouth 2 (two) times daily. 04/05/16 04/12/16  Willy Eddy, MD    Allergies Bee venom; Penicillins; Ibuprofen; Tramadol; and Vancomycin  Family History  Problem Relation Age of Onset  . Cirrhosis Mother   . Diabetes Mellitus II Maternal Grandmother     Social History Social History  Substance Use Topics  .  Smoking status: Current Every Day Smoker    Packs/day: 2.00    Types: Cigarettes  . Smokeless tobacco: Never Used  . Alcohol use No    Review of Systems {** Revise as appropriate then delete this line - Constitutional: No fever/chills Eyes: No visual changes. ENT: No sore throat. No stiff neck no neck pain Cardiovascular: Denies chest pain. Respiratory: Denies shortness of breath. Gastrointestinal:   no  vomiting.  No diarrhea.  No constipation. Genitourinary: Negative for dysuria. Musculoskeletal: Negative lower extremity swelling Skin: Negative for rash. Neurological: Negative for severe headaches, focal weakness or numbness. 10-point ROS otherwise negative.  ____________________________________________   PHYSICAL EXAM:  VITAL SIGNS: ED Triage Vitals  Enc Vitals Group     BP 04/09/16 0807 137/77     Pulse Rate 04/09/16 0807 94     Resp 04/09/16 0807 16     Temp 04/09/16 0807 98.6 F (37 C)     Temp Source 04/09/16 0807 Oral     SpO2 04/09/16 0807 98 %     Weight 04/09/16 0808 130 lb (59 kg)     Height 04/09/16 0808 5\' 4"  (1.626 m)     Head Circumference --      Peak Flow --      Pain Score 04/09/16 0809 10     Pain Loc --      Pain Edu? --      Excl. in GC? --     Constitutional: Alert and oriented. Well appearing and in no acute distress. Eyes: Conjunctivae are normal. PERRL. EOMI. Head: Atraumatic. Nose: No congestion/rhinnorhea. Mouth/Throat: Mucous membranes are moist.  Oropharynx non-erythematous. Neck: No stridor.   Nontender with no meningismus Cardiovascular: Normal rate, regular rhythm. Grossly normal heart sounds.  Good peripheral circulation. Respiratory: Normal respiratory effort.  No retractions. Lungs CTAB. Abdominal: Soft and nontender. No distention. No guarding no rebound Back:  There is no focal tenderness or step off.  there is no midline tenderness there are no lesions noted. there is Also left CVA tenderness GU: Normal external genitalia Musculoskeletal: No lower extremity tenderness, no upper extremity tenderness. No joint effusions, no DVT signs strong distal pulses no edema Neurologic:  Normal speech and language. No gross focal neurologic deficits are appreciated.  Skin:  Skin is warm, dry and intact. No rash noted. Psychiatric: Mood and affect are normal. Speech and behavior are normal.  ____________________________________________    LABS (all labs ordered are listed, but only abnormal results are displayed)  Labs Reviewed  CBC WITH DIFFERENTIAL/PLATELET - Abnormal; Notable for the following:       Result Value   RBC 4.31 (*)    HCT 38.4 (*)    RDW 15.1 (*)    All other components within normal limits  URINE CULTURE  BLOOD GAS, VENOUS  COMPREHENSIVE METABOLIC PANEL  URINALYSIS COMPLETEWITH MICROSCOPIC ([facility] ONLY)  LIPASE, BLOOD  ETHANOL  URINE DRUG SCREEN, QUALITATIVE ([facility] ONLY)   ____________________________________________  EKG  I personally interpreted any EKGs ordered by me or triage  ____________________________________________  RADIOLOGY  I reviewed any imaging ordered by me or triage that were performed during my shift and, if possible, patient and/or family made aware of any abnormal findings. ____________________________________________   PROCEDURES  Procedure(s) performed: None  Procedures  Critical Care performed: None  ____________________________________________   INITIAL IMPRESSION / ASSESSMENT AND PLAN / ED COURSE  Pertinent labs & imaging results that were available during my care of the patient were reviewed by me and considered in  my medical decision making (see chart for details).  Patient with history of poorly controlled diabetes mellitus. His sugars much erythematous. From EMS report. We did have difficulty obtaining IV access as patient is an IV drug user. Since that time we have given him 2 L of fluid. His lab work was further delayed by lab down time. Unfortunately, he is showing evidence of early mild DKA with positive ketones and tachypnea. We are starting him on an insulin drip. We'll admit him to the hospital. He does have some minimal left flank pain for 2 requesting narcotics. I did initially give him fentanyl that will desist and further narcotic pain medications will obtain a renal colic ultrasound even though his urine is reassuring to ensure no other pathology  is noted including kidney stones. Low suspicion however.  Clinical Course   ____________________________________________   FINAL CLINICAL IMPRESSION(S) / ED DIAGNOSES  Final diagnoses:  Flank pain      This chart was dictated using voice recognition software.  Despite best efforts to proofread,  errors can occur which can change meaning.      Jeanmarie PlantJames A Jhaden Pizzuto, MD 04/09/16 [id number]

## 2016-04-09 NOTE — ED Notes (Signed)
UA sent to lab

## 2016-04-09 NOTE — ED Triage Notes (Signed)
"  my sugar is up and down and my left side is hurting real bad".  Symptom onset 3 days.  Reports vomiting x 3 days.  Arrived via [facility].  CBG:  167.

## 2016-04-09 NOTE — ED Notes (Signed)
Patient's fingerstick blood sugar is 532

## 2016-04-09 NOTE — H&P (Signed)
[facility] - [place] at [facility]   PATIENT NAME: Trevor Brown    MR#:  [id number]  DATE OF BIRTH:  06-04-1991  DATE OF ADMISSION:  04/09/2016  PRIMARY CARE PHYSICIAN: No PCP Per Patient   REQUESTING/REFERRING PHYSICIAN:   CHIEF COMPLAINT:   Chief Complaint  Patient presents with  . Abdominal Pain    HISTORY OF PRESENT ILLNESS: Trevor Brown  is a 25 y.o. 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 On arrival to the hospital. He was noted to have hyperglycemia with blood glucose levels above 400, his labs showed a mild acidosis. CO2 level of 20, hyponatremia, elevated transaminase levels, urine drug screen was positive for opiates, urinalysis revealed trace ketones. Patient admits of nausea, vomiting and abdominal pain, diarrhea. He states that he's been having nausea and vomiting for the past 2 days, diarrhea for one day. Hospitalist services were contacted for admission  PAST MEDICAL HISTORY:   Past Medical History:  Diagnosis Date  . Diabetes mellitus without complication (HCC)   . Heart attack (HCC)    Pt claims he had heart attack a year ago ( 2015)- and was admitted in [facility] for that, but not given any meds or angiogram, on review of chart- I could not find any details like that.  . Hepatitis C, acute may 2016  . Hypertension     PAST SURGICAL HISTORY: History reviewed. No pertinent surgical history.  SOCIAL HISTORY:  Social History  Substance Use Topics  . Smoking status: Current Every Day Smoker    Packs/day: 2.00    Types: Cigarettes  . Smokeless tobacco: Never Used  . Alcohol use No    FAMILY HISTORY:  Family History  Problem Relation Age of Onset  . Cirrhosis Mother   . Diabetes Mellitus II  Maternal Grandmother     DRUG ALLERGIES:  Allergies  Allergen Reactions  . Bee Venom Anaphylaxis  . Penicillins Anaphylaxis, Hives and Other (See Comments)    Has patient had a PCN reaction causing immediate rash, facial/tongue/throat swelling, SOB or lightheadedness with hypotension: Yes Has patient had a PCN reaction causing severe rash involving mucus membranes or skin necrosis: No Has patient had a PCN reaction that required hospitalization No Has patient had a PCN reaction occurring within the last 10 years: Yes If all of the above answers are "NO", then may proceed with Cephalosporin use.  . Ibuprofen Other (See Comments)    Reaction:  GI upset   . Tramadol Hives and Swelling  . Vancomycin Rash and Other (See Comments)    Reaction:  Red man's syndrome     Review of Systems  Constitutional: Positive for chills and malaise/fatigue. Negative for fever and weight loss.  HENT: Negative for congestion.   Eyes: Negative for blurred vision and double vision.  Respiratory: Negative for cough, sputum production, shortness of breath and wheezing.   Cardiovascular: Positive for chest pain. Negative for palpitations, orthopnea, leg swelling and PND.  Gastrointestinal: Positive for abdominal pain, diarrhea, nausea and vomiting. Negative for blood in stool and constipation.  Genitourinary: Negative for dysuria, frequency, hematuria and urgency.  Musculoskeletal: Negative for falls.  Neurological: Positive for sensory change. Negative for dizziness, tremors, focal weakness and headaches.  Endo/Heme/Allergies: Does not bruise/bleed easily.  Psychiatric/Behavioral: Negative  for depression. The patient does not have insomnia.     MEDICATIONS AT HOME:  Prior to Admission medications   Medication Sig Start Date End Date Taking? Authorizing Provider  insulin aspart protamine- aspart (NOVOLOG MIX 70/30) (70-30) 100 UNIT/ML injection Inject 0.4 mLs (40 Units total) into the skin 2 (two) times  daily with a meal. 03/27/16  Yes Katharina Caper, MD  nicotine (NICODERM CQ - DOSED IN MG/24 HOURS) 21 mg/24hr patch Place 1 patch (21 mg total) onto the skin daily. 03/27/16  Yes Katharina Caper, MD  oxyCODONE (OXY IR/ROXICODONE) 5 MG immediate release tablet Take 1 tablet (5 mg total) by mouth 4 (four) times daily as needed (moderate and severe pain). 03/27/16  Yes Katharina Caper, MD  insulin aspart (NOVOLOG) 100 UNIT/ML injection 0-9 Units, Subcutaneous, 3 times daily before meals & bedtime, First dose on Fri 03/15/16 at 2200 Correction coverage: Sensitive (thin, NPO, renal) CBG < 70: implement hypoglycemia protocol CBG 70 - 120: 0 units CBG 121 - 150: 1 unit CBG 151 - 200: 2 units CBG 201 - 250: 3 units CBG 251 - 300: 5 units CBG 301 - 350: 7 units CBG 351 - 400: 9 units Patient not taking: Reported on 04/09/2016 03/16/16   Wyatt Haste, MD  sulfamethoxazole-trimethoprim (BACTRIM DS,SEPTRA DS) 800-160 MG tablet Take 1 tablet by mouth 2 (two) times daily. 04/05/16 04/12/16  Willy Eddy, MD      PHYSICAL EXAMINATION:   VITAL SIGNS: Blood pressure 132/75, pulse 83, temperature 98.6 F (37 C), temperature source Oral, resp. rate (!) 24, height 5\' 4"  (1.626 m), weight 59 kg (130 lb), SpO2 100 %.  GENERAL:  25 y.o.-year-old patient lying in the bed In mild discomfort due to abdominal pain . Poor dentition EYES: Pupils equal, round, reactive to light and accommodation. No scleral icterus. Extraocular muscles intact.  HEENT: Head atraumatic, normocephalic. Oropharynx and nasopharynx clear.  NECK:  Supple, no jugular venous distention. No thyroid enlargement, no tenderness.  LUNGS: Normal breath sounds bilaterally, no wheezing, rales,rhonchi or crepitation. No use of accessory muscles of respiration.  CARDIOVASCULAR: S1, S2 normal. No murmurs, rubs, or gallops.  ABDOMEN: Soft, tender in epigastrium as well as left side of abdomen, but no rebound or voluntary guarding noted on palpation, nondistended.  Bowel sounds present. No organomegaly or mass.  EXTREMITIES: No pedal edema, cyanosis, or clubbing.  NEUROLOGIC: Cranial nerves II through XII are intact. Muscle strength 5/5 in all extremities. Sensation intact. Gait not checked.  PSYCHIATRIC: The patient is alert and oriented x 3.  SKIN: No obvious rash, lesion, or ulcer.   LABORATORY PANEL:   CBC  Recent Labs Lab 04/05/16 0653 04/09/16 0958  WBC 9.1 7.6  HGB 12.6* 13.3  HCT 36.4* 38.4*  PLT 260 321  MCV 89.4 89.1  MCH 30.9 30.9  MCHC 34.6 34.7  RDW 15.3* 15.1*  LYMPHSABS  --  1.3  MONOABS  --  0.5  EOSABS  --  0.1  BASOSABS  --  0.1   ------------------------------------------------------------------------------------------------------------------  Chemistries   Recent Labs Lab 04/05/16 0653  NA 132*  K 4.8  CL 96*  CO2 24  GLUCOSE 489*  BUN 22*  CREATININE 0.83  CALCIUM 9.5  AST 189*  ALT 294*  ALKPHOS 332*  BILITOT 1.1   ------------------------------------------------------------------------------------------------------------------  Cardiac Enzymes No results for input(s): TROPONINI in the last 168 hours. ------------------------------------------------------------------------------------------------------------------  RADIOLOGY: Ct Renal Stone Study  Result Date: 04/09/2016 CLINICAL DATA:  Left flank pain for 3 days EXAM:  CT ABDOMEN AND PELVIS WITHOUT CONTRAST TECHNIQUE: Multidetector CT imaging of the abdomen and pelvis was performed following the standard protocol without IV contrast. COMPARISON:  03/25/2016 FINDINGS: Lower chest: No acute abnormality. Hepatobiliary: No focal liver abnormality is seen. No gallstones, gallbladder wall thickening, or biliary dilatation. Pancreas: Unremarkable. No pancreatic ductal dilatation or surrounding inflammatory changes. Spleen: Normal in size without focal abnormality. Adrenals/Urinary Tract: The adrenal glands are within normal limits. A tiny 1-2 mm  nonobstructing stone is noted within the right kidney. No obstructive changes are identified. No left-sided calculi are seen. The bladder is well distended. Stomach/Bowel: Stomach is within normal limits. Appendix appears normal. No evidence of bowel wall thickening, distention, or inflammatory changes. Vascular/Lymphatic: No significant vascular findings are present. No enlarged abdominal or pelvic lymph nodes. Reproductive: Prostate is unremarkable. Other: No abdominal wall hernia or abnormality. No abdominopelvic ascites. Musculoskeletal: No acute or significant osseous findings. IMPRESSION: Small nonobstructing right renal stone. No other focal abnormality is noted. Electronically Signed   By: Alcide Clever M.D.   On: 04/09/2016 13:44    EKG: Orders placed or performed during the hospital encounter of 03/25/16  . ED EKG  . ED EKG  . EKG 12-Lead  . EKG 12-Lead    IMPRESSION AND PLAN:  Principal Problem:   DKA, type 1 (HCC) Active Problems:   DKA (diabetic ketoacidoses) (HCC)  #1. Mild DKA with underlying diabetes type 1, get hemoglobin A1c, initiate patient on insulin drip, IV fluids, following BMP in 4 hours, resume patient on 70/30 insulin twice a day when CO2 level is normal #2. Hyponatremia, continue IV fluids, follow sodium level in the morning #3. Acute gastroenteritis, contact isolation, get C. difficile studies, enteric pathogens, supportive therapy, IV fluids #4. Elevated transaminase level of unclear etiology, possibly due to underlying hep C, no significant difference from recent studies #5 hypotension, IV fluids, supportive therapy #6. Tobacco abuse. Counseling, discussed for 3 minutes, nicotine replacement therapy will be initiated  All the records are reviewed and case discussed with ED provider. Management plans discussed with the patient, family and they are in agreement.  CODE STATUS: Code Status History    Date Active Date Inactive Code Status Order ID Comments User  Context   03/26/2016  1:18 AM 03/27/2016  7:11 PM Full Code [id number]  Tonye Royalty, DO Inpatient   03/18/2016  8:15 AM 03/21/2016  8:35 PM Full Code [id number]  Wyatt Haste, MD ED   03/12/2016 12:20 PM 03/16/2016  7:27 PM Full Code [id number]  Enedina Finner, MD Inpatient   02/18/2016  4:59 PM 02/19/2016  5:18 PM Full Code [id number]  Ramonita Lab, MD ED   01/12/2016  8:58 AM 01/15/2016  7:33 PM Full Code [id number]  Milagros Loll, MD ED   01/11/2016 12:35 AM 01/11/2016 11:20 PM Full Code [id number]  Gery Pray, MD Inpatient   12/12/2015 10:04 PM 12/14/2015  6:38 PM Full Code [id number]  Katharina Caper, MD Inpatient   10/23/2015  5:42 PM 10/25/2015  7:04 PM Full Code [id number]  Katha Hamming, MD ED   06/10/2015  8:20 AM 06/12/2015  2:41 PM Full Code [id number]  Arnaldo Natal, MD Inpatient   06/06/2015  8:43 PM 06/07/2015  4:20 PM Full Code [id number]  Enedina Finner, MD Inpatient   04/07/2015 12:45 PM 04/08/2015  8:37 PM Full Code [id number]  Altamese Dilling, MD Inpatient   12/09/2014  2:44 PM 12/13/2014  4:16 PM Full Code [id number]  Gale Journey, MD Inpatient  TOTAL TIME TAKING CARE OF THIS PATIENT: 50 minutes.    Katharina CaperVAICKUTE,Shemicka Cohrs M.D on 04/09/2016 at 2:34 PM  Between 7am to 6pm - Pager - [phone] After 6pm go to [email or web address] - password [email or web address] [facility]  [facility]  Office  [phone]  CC: Primary care physician; No PCP Per Patient

## 2016-04-09 NOTE — Progress Notes (Signed)
Inpatient Diabetes Program Recommendations  AACE/ADA: New Consensus Statement on Inpatient Glycemic Control (2015)  Target Ranges:  Prepandial:   less than 140 mg/dL      Peak postprandial:   less than 180 mg/dL (1-2 hours)      Critically ill patients:  140 - 180 mg/dL   Lab Results  Component Value Date   GLUCAP 242 (H) 04/05/2016   HGBA1C 13.4 (H) 02/19/2016    Inpatient diabetes staff concerned about Trevor Brown's dental status.   Please advise on d/c that patient should [facility] to get [facility] with their dental clinic.  Their number is [phone].  Susette RacerJulie Londin Antone, RN, BA, MHA, CDE Diabetes Coordinator Inpatient Diabetes Program  [phone] (Team Pager) [phone] [facility] Office) 04/09/2016 9:31 AM

## 2016-04-09 NOTE — ED Notes (Signed)
Fsbs: 476

## 2016-04-10 LAB — CBC
HCT: 36 % — ABNORMAL LOW (ref 40.0–52.0)
Hemoglobin: 12.4 g/dL — ABNORMAL LOW (ref 13.0–18.0)
MCH: 30.7 pg (ref 26.0–34.0)
MCHC: 34.6 g/dL (ref 32.0–36.0)
MCV: 88.9 fL (ref 80.0–100.0)
Platelets: 298 10*3/uL (ref 150–440)
RBC: 4.05 MIL/uL — ABNORMAL LOW (ref 4.40–5.90)
RDW: 14.9 % — ABNORMAL HIGH (ref 11.5–14.5)
WBC: 11.2 10*3/uL — ABNORMAL HIGH (ref 3.8–10.6)

## 2016-04-10 LAB — BASIC METABOLIC PANEL
Anion gap: 7 (ref 5–15)
Anion gap: 7 (ref 5–15)
Anion gap: 8 (ref 5–15)
Anion gap: 9 (ref 5–15)
Anion gap: 6 (ref 5–15)
BUN: 13 mg/dL (ref 6–20)
BUN: 12 mg/dL (ref 6–20)
BUN: 12 mg/dL (ref 6–20)
BUN: 14 mg/dL (ref 6–20)
BUN: 15 mg/dL (ref 6–20)
CO2: 25 mmol/L (ref 22–32)
CO2: 23 mmol/L (ref 22–32)
CO2: 21 mmol/L — ABNORMAL LOW (ref 22–32)
CO2: 22 mmol/L (ref 22–32)
CO2: 24 mmol/L (ref 22–32)
Calcium: 9.2 mg/dL (ref 8.9–10.3)
Calcium: 8.7 mg/dL — ABNORMAL LOW (ref 8.9–10.3)
Calcium: 8.7 mg/dL — ABNORMAL LOW (ref 8.9–10.3)
Calcium: 8.5 mg/dL — ABNORMAL LOW (ref 8.9–10.3)
Calcium: 8.6 mg/dL — ABNORMAL LOW (ref 8.9–10.3)
Chloride: 107 mmol/L (ref 101–111)
Chloride: 108 mmol/L (ref 101–111)
Chloride: 106 mmol/L (ref 101–111)
Chloride: 102 mmol/L (ref 101–111)
Chloride: 105 mmol/L (ref 101–111)
Creatinine, Ser: 0.69 mg/dL (ref 0.61–1.24)
Creatinine, Ser: 0.52 mg/dL — ABNORMAL LOW (ref 0.61–1.24)
Creatinine, Ser: 0.72 mg/dL (ref 0.61–1.24)
Creatinine, Ser: 0.81 mg/dL (ref 0.61–1.24)
Creatinine, Ser: 0.75 mg/dL (ref 0.61–1.24)
GFR calc Af Amer: >60 mL/min (ref >60)
GFR calc Af Amer: >60 mL/min (ref >60)
GFR calc Af Amer: >60 mL/min (ref >60)
GFR calc Af Amer: >60 mL/min (ref >60)
GFR calc Af Amer: >60 mL/min (ref >60)
GFR calc non Af Amer: >60 mL/min (ref >60)
GFR calc non Af Amer: >60 mL/min (ref >60)
GFR calc non Af Amer: >60 mL/min (ref >60)
GFR calc non Af Amer: >60 mL/min (ref >60)
GFR calc non Af Amer: >60 mL/min (ref >60)
Glucose, Bld: 101 mg/dL — ABNORMAL HIGH (ref 65–99)
Glucose, Bld: 99 mg/dL (ref 65–99)
Glucose, Bld: 249 mg/dL — ABNORMAL HIGH (ref 65–99)
Glucose, Bld: 316 mg/dL — ABNORMAL HIGH (ref 65–99)
Glucose, Bld: 209 mg/dL — ABNORMAL HIGH (ref 65–99)
Potassium: 3.6 mmol/L (ref 3.5–5.1)
Potassium: 3.6 mmol/L (ref 3.5–5.1)
Potassium: 3.6 mmol/L (ref 3.5–5.1)
Potassium: 3.9 mmol/L (ref 3.5–5.1)
Potassium: 3.6 mmol/L (ref 3.5–5.1)
Sodium: 139 mmol/L (ref 135–145)
Sodium: 138 mmol/L (ref 135–145)
Sodium: 135 mmol/L (ref 135–145)
Sodium: 133 mmol/L — ABNORMAL LOW (ref 135–145)
Sodium: 135 mmol/L (ref 135–145)

## 2016-04-10 LAB — GLUCOSE, CAPILLARY
Glucose-Capillary: 155 mg/dL — ABNORMAL HIGH (ref 65–99)
Glucose-Capillary: 225 mg/dL — ABNORMAL HIGH (ref 65–99)
Glucose-Capillary: 70 mg/dL (ref 65–99)
Glucose-Capillary: 344 mg/dL — ABNORMAL HIGH (ref 65–99)
Glucose-Capillary: 92 mg/dL (ref 65–99)
Glucose-Capillary: 147 mg/dL — ABNORMAL HIGH (ref 65–99)
Glucose-Capillary: 245 mg/dL — ABNORMAL HIGH (ref 65–99)
Glucose-Capillary: 115 mg/dL — ABNORMAL HIGH (ref 65–99)
Glucose-Capillary: 313 mg/dL — ABNORMAL HIGH (ref 65–99)
Glucose-Capillary: 99 mg/dL (ref 65–99)
Glucose-Capillary: 285 mg/dL — ABNORMAL HIGH (ref 65–99)
Glucose-Capillary: 291 mg/dL — ABNORMAL HIGH (ref 65–99)
Glucose-Capillary: 167 mg/dL — ABNORMAL HIGH (ref 65–99)
Glucose-Capillary: 327 mg/dL — ABNORMAL HIGH (ref 65–99)
Glucose-Capillary: 478 mg/dL — ABNORMAL HIGH (ref 65–99)
Glucose-Capillary: 523 mg/dL (ref 65–99)

## 2016-04-10 LAB — URINE CULTURE: Culture: NO GROWTH

## 2016-04-10 LAB — HEMOGLOBIN A1C
Hgb A1c MFr Bld: 9.8 % — ABNORMAL HIGH (ref 4.8–5.6)
Mean Plasma Glucose: 235 mg/dL

## 2016-04-10 LAB — TROPONIN I
Troponin I: <0.03 ng/mL (ref <0.03)
Troponin I: <0.03 ng/mL (ref <0.03)

## 2016-04-10 MED ORDER — DEXTROSE 50 % IV SOLN
12.0000 mL | Freq: Once | INTRAVENOUS | Status: AC
  Administered 2016-04-10: 12 mL via INTRAVENOUS
  Filled 2016-04-10: qty 50

## 2016-04-10 MED ORDER — INSULIN ASPART 100 UNIT/ML ~~LOC~~ SOLN
0.0000 [IU] | Freq: Three times a day (TID) | SUBCUTANEOUS | Status: DC
  Administered 2016-04-10: 2 [IU] via SUBCUTANEOUS
  Administered 2016-04-10: 7 [IU] via SUBCUTANEOUS
  Filled 2016-04-10: qty 7
  Filled 2016-04-10: qty 2

## 2016-04-10 MED ORDER — SODIUM CHLORIDE 0.9 % IV SOLN
INTRAVENOUS | Status: DC
  Administered 2016-04-10 (×2): via INTRAVENOUS

## 2016-04-10 MED ORDER — INSULIN ASPART 100 UNIT/ML ~~LOC~~ SOLN
0.0000 [IU] | Freq: Three times a day (TID) | SUBCUTANEOUS | Status: DC
  Administered 2016-04-10: 15 [IU] via SUBCUTANEOUS
  Administered 2016-04-11: 2 [IU] via SUBCUTANEOUS
  Filled 2016-04-10: qty 2
  Filled 2016-04-10: qty 15

## 2016-04-10 MED ORDER — GLUCERNA SHAKE PO LIQD
237.0000 mL | Freq: Three times a day (TID) | ORAL | Status: DC
  Administered 2016-04-10 – 2016-04-11 (×2): 237 mL via ORAL

## 2016-04-10 MED ORDER — INSULIN ASPART 100 UNIT/ML ~~LOC~~ SOLN: 0.0000 [IU] | Freq: Three times a day (TID) | SUBCUTANEOUS | Status: DC

## 2016-04-10 MED ORDER — INSULIN ASPART 100 UNIT/ML ~~LOC~~ SOLN: 0.0000 [IU] | Freq: Every day | SUBCUTANEOUS | Status: DC

## 2016-04-10 MED ORDER — INSULIN ASPART PROT & ASPART (70-30 MIX) 100 UNIT/ML ~~LOC~~ SUSP
40.0000 [IU] | Freq: Two times a day (BID) | SUBCUTANEOUS | Status: DC
  Administered 2016-04-10 (×2): 40 [IU] via SUBCUTANEOUS
  Filled 2016-04-10 (×2): qty 40

## 2016-04-10 MED ORDER — INSULIN ASPART 100 UNIT/ML ~~LOC~~ SOLN
4.0000 [IU] | Freq: Three times a day (TID) | SUBCUTANEOUS | Status: DC
  Administered 2016-04-10 – 2016-04-11 (×3): 4 [IU] via SUBCUTANEOUS
  Filled 2016-04-10 (×3): qty 4

## 2016-04-10 MED ORDER — POTASSIUM CHLORIDE CRYS ER 20 MEQ PO TBCR
30.0000 meq | EXTENDED_RELEASE_TABLET | ORAL | Status: AC
  Administered 2016-04-10 (×2): 30 meq via ORAL
  Filled 2016-04-10 (×2): qty 1

## 2016-04-10 MED ORDER — INSULIN GLARGINE 100 UNIT/ML ~~LOC~~ SOLN
12.0000 [IU] | Freq: Two times a day (BID) | SUBCUTANEOUS | Status: DC
  Filled 2016-04-10 (×2): qty 0.12

## 2016-04-10 NOTE — Plan of Care (Signed)
Gave report to Kelly RN

## 2016-04-10 NOTE — Plan of Care (Signed)
Spoke to MD about the previous lantus order and updated insulin per Diabetes coordinator recommendation and home med regimen.  Enteric precautions dc as pt has not had any BM since admission. Will transition him off of the insulin drip and move to floor

## 2016-04-10 NOTE — Progress Notes (Signed)
Called Dr Anne HahnWillis for order clarification with new SSI order. Informed that pts FSBS was higher than 400 and order only covers up to 400. OK to give the 15 units and recheck sugar at 0300.

## 2016-04-10 NOTE — Progress Notes (Signed)
FSBS of 523 obtained with repeat of 478. Prime doctor paged and return page by Dr. Anne HahnWillis. New orders received.

## 2016-04-10 NOTE — Care Management (Addendum)
This patient is well-known to this RNCM (and probably this hospital) as he recurrently admits to this hospital in DKA.  He has obtained meds from Medication mgt however I have sent message to [facility] to see if patient followed up with them as he did have an appointment. He has stayed at homeless shelter in the past with his father. He is non-compliant with diet as witnessed by staff on previous admission. He has the resources- he has to put them in action. He unfortunately has the right to make bad decisions. I have met with him one-on-one and discussed risks of not controlling his blood sugars after a foot injury. He agrees with me every time but somehow ends back up in the hospital. [facility] will continue to follow.

## 2016-04-10 NOTE — Plan of Care (Signed)
Problem: Education: Goal: Knowledge of [facility] Education information/materials will improve Outcome: Progressing BP 124/83   Pulse 75   Temp 98.3 F (36.8 C) (Oral)   Resp 15   Ht 5\' 4"  (1.626 m)   Wt 55.2 kg (121 lb 11.1 oz)   SpO2 98%   BMI 20.89 kg/m  POC reviewed with patient, cont on self repositioning while in bed, vital signs closely monitored and any concerns addressed at this time. Will continue to monitor. Pt needs reinforcement on snacks and food portions during meals Blood sugar euglycemic at this time, still on drip, will transition off drip later in the day Right upper arm blister ota improving Lungs clear bilaterally, abdomen sounds present

## 2016-04-10 NOTE — Progress Notes (Addendum)
Inpatient Diabetes Program Recommendations  AACE/ADA: New Consensus Statement on Inpatient Glycemic Control (2015)  Target Ranges:  Prepandial:   less than 140 mg/dL      Peak postprandial:   less than 180 mg/dL (1-2 hours)      Critically ill patients:  140 - 180 mg/dL   Results for Trevor Brown, [name] (MRN [id number]) as of 04/10/2016 07:48  Ref. Range 04/10/2016 00:34 04/10/2016 01:29 04/10/2016 02:52 04/10/2016 03:54 04/10/2016 05:02 04/10/2016 06:14 04/10/2016 07:14  Glucose-Capillary Latest Ref Range: 65 - 99 mg/dL 782225 (H) 956155 (H) 92 70 213344 (H) 245 (H) 147 (H)  Results for Trevor Brown, [name] (MRN [id number]) as of 04/10/2016 07:48  Ref. Range 04/09/2016 12:46 04/09/2016 13:14  Glucose-Capillary Latest Ref Range: 65 - 99 mg/dL 629541 (HH) 528509 (HH)   Review of Glycemic Control  Diabetes history: DM1 Outpatient Diabetes medications: 70/30 40 units BID, Novolog 0-9 units QID Current orders for Inpatient glycemic control: IV insulin per GlucoStabilizer (DKA order set)  Inpatient Diabetes Program Recommendations: Insulin - Basal: Once MD is ready to transition from IV to SQ insulin, please consider ordering 70/30 40 units BID.  Correction (SSI): Once MD is ready to transition from IV to SQ insulin, please consider ordering Novolog 0-9 units TID with meals and Novolog 0-5 units QHS.  NOTE: Noted consult for Inpatient Diabetes and chart reviewed. Patient is very well known to Inpatient Glycemic Control Team and has been seen by Diabetes Coordinator multiple times over the past few months. Unfortunately patient does not comply with dietary restrictions (despite strong advisement to do so) which further elevates glucose. Patient has been admitted 9 times over the past 6 months for DKA due to noncompliance with medications and diet. Diabetes Coordinator will continue to follow as an inpatient and make further recommendations as more data is collected.  NURSING: Please do not administer any insulin injections in  patient's arms. Only use abdomen (rotating injection sites) for insulin administration while inpatient.  Thanks, Orlando PennerMarie Jenin Birdsall, RN, MSN, CDE Diabetes Coordinator Inpatient Diabetes Program [phone] (Team Pager from 8am to 5pm) [phone] ([facility] office) [phone] [facility] office) [phone] [facility] office)

## 2016-04-10 NOTE — Progress Notes (Signed)
[facility] - [place] at [facility]   PATIENT NAME: Trevor Brown    MR#:  [id number]  DATE OF BIRTH:  1990/11/17  SUBJECTIVE: Admitted for mildly DKA. Patient had left lower quadrant abdominal pain, nausea, vomiting, diarrhea. Blood sugar 400 on admission. Started on insulin drip, admitted to ICU. Patient has no further nausea or vomiting or diarrhea. Tolerating the diet. Has some mild lower quadrant pain on the left side. But overall much better.  admitted 9 times in the last 6 months.   CHIEF COMPLAINT:   Chief Complaint  Patient presents with  . Abdominal Pain    REVIEW OF SYSTEMS:   ROS CONSTITUTIONAL: No fever, fatigue or weakness.  EYES: No blurred or double vision.  EARS, NOSE, AND THROAT: No tinnitus or ear pain.  RESPIRATORY: No cough, shortness of breath, wheezing or hemoptysis.  CARDIOVASCULAR: No chest pain, orthopnea, edema.  GASTROINTESTINAL: No nausea, vomiting, diarrhea.Left lower quadrant abdominal pain.  GENITOURINARY: No dysuria, hematuria.  ENDOCRINE: No polyuria, nocturia,  HEMATOLOGY: No anemia, easy bruising or bleeding SKIN: No rash or lesion. MUSCULOSKELETAL: No joint pain or arthritis.   NEUROLOGIC: No tingling, numbness, weakness.  PSYCHIATRY: No anxiety or depression.   DRUG ALLERGIES:   Allergies  Allergen Reactions  . Bee Venom Anaphylaxis  . Penicillins Anaphylaxis, Hives and Other (See Comments)    Has patient had a PCN reaction causing immediate rash, facial/tongue/throat swelling, SOB or lightheadedness with hypotension: Yes Has patient had a PCN reaction causing severe rash involving mucus membranes or skin necrosis: No Has patient had a PCN reaction that required hospitalization No Has patient had a PCN reaction occurring within the last 10 years: Yes If all of the above answers are "NO", then may proceed with Cephalosporin use.  . Ibuprofen Other (See Comments)    Reaction:  GI upset   . Tramadol Hives and  Swelling  . Vancomycin Rash and Other (See Comments)    Reaction:  Red man's syndrome     VITALS:  Blood pressure 124/83, pulse 75, temperature 98.3 F (36.8 C), temperature source Oral, resp. rate 15, height 5\' 4"  (1.626 m), weight 55.2 kg (121 lb 11.1 oz), SpO2 98 %.  PHYSICAL EXAMINATION:  GENERAL:  25 y.o.-year-old patient lying in the bed with no acute distress.  EYES: Pupils equal, round, reactive to light and accommodation. No scleral icterus. Extraocular muscles intact.  HEENT: Head atraumatic, normocephalic. Oropharynx and nasopharynx clear.  NECK:  Supple, no jugular venous distention. No thyroid enlargement, no tenderness.  LUNGS: Normal breath sounds bilaterally, no wheezing, rales,rhonchi or crepitation. No use of accessory muscles of respiration.  CARDIOVASCULAR: S1, S2 normal. No murmurs, rubs, or gallops.  ABDOMEN: r, nondistended. Bowel sounds present. No organomegaly or mass. Complains of left lower quadrant tenderness . EXTREMITIES: No pedal edema, cyanosis, or clubbing.  NEUROLOGIC: Cranial nerves II through XII are intact. Muscle strength 5/5 in all extremities. Sensation intact. Gait not checked.  PSYCHIATRIC: The patient is alert and oriented x 3.  SKIN: No obvious rash, lesion, or ulcer.    LABORATORY PANEL:   CBC  Recent Labs Lab 04/10/16 0558  WBC 11.2*  HGB 12.4*  HCT 36.0*  PLT 298   ------------------------------------------------------------------------------------------------------------------  Chemistries   Recent Labs Lab 04/09/16 0958 04/09/16 1703  04/10/16 0558  NA 129*  --   < > 135  K 4.3  --   < > 3.6  CL 93*  --   < > 106  CO2 20*  --   < >  21*  GLUCOSE 438*  --   < > 249*  BUN 16  --   < > 12  CREATININE 0.93  --   < > 0.72  CALCIUM 9.3  --   < > 8.7*  MG  --  1.8  --   --   AST 107*  --   --   --   ALT 194*  --   --   --   ALKPHOS 291*  --   --   --   BILITOT 1.5*  --   --   --   < > = values in this interval not  displayed. ------------------------------------------------------------------------------------------------------------------  Cardiac Enzymes  Recent Labs Lab 04/10/16 0558  TROPONINI <0.03   ------------------------------------------------------------------------------------------------------------------  RADIOLOGY:  Ct Renal Stone Study  Result Date: 04/09/2016 CLINICAL DATA:  Left flank pain for 3 days EXAM: CT ABDOMEN AND PELVIS WITHOUT CONTRAST TECHNIQUE: Multidetector CT imaging of the abdomen and pelvis was performed following the standard protocol without IV contrast. COMPARISON:  03/25/2016 FINDINGS: Lower chest: No acute abnormality. Hepatobiliary: No focal liver abnormality is seen. No gallstones, gallbladder wall thickening, or biliary dilatation. Pancreas: Unremarkable. No pancreatic ductal dilatation or surrounding inflammatory changes. Spleen: Normal in size without focal abnormality. Adrenals/Urinary Tract: The adrenal glands are within normal limits. A tiny 1-2 mm nonobstructing stone is noted within the right kidney. No obstructive changes are identified. No left-sided calculi are seen. The bladder is well distended. Stomach/Bowel: Stomach is within normal limits. Appendix appears normal. No evidence of bowel wall thickening, distention, or inflammatory changes. Vascular/Lymphatic: No significant vascular findings are present. No enlarged abdominal or pelvic lymph nodes. Reproductive: Prostate is unremarkable. Other: No abdominal wall hernia or abnormality. No abdominopelvic ascites. Musculoskeletal: No acute or significant osseous findings. IMPRESSION: Small nonobstructing right renal stone. No other focal abnormality is noted. Electronically Signed   By: Alcide Clever M.D.   On: 04/09/2016 13:44    EKG:   Orders placed or performed during the hospital encounter of 03/25/16  . ED EKG  . ED EKG  . EKG 12-Lead  . EKG 12-Lead    ASSESSMENT AND PLAN:   #1 mild DKA: With  evidence of hyponatremia, anion gap  16, elevated blood sugar on admission. Patient improved with IV hydration, insulin drip. Hyponatremia, hyperglycemia corrected. Anion gap Improved to 8. Patient to will be converted back to his insulin 70 /30 40 units twice a day as per  diabetes coordinator recommendation, transfer the patient to medical floor. 2. Diarrhea: Resolved. Discontinue enteric precautions. #3 type 1 diabetes mellitus with recurrent admissions: Patient requesting diabetic medication, dietary consult. Patient is having trouble with portion control. likley d/c am Discussed with RN.   All the records are reviewed and case discussed with Care Management/Social Workerr. Management plans discussed with the patient, family and they are in agreement.  CODE STATUS:  TOTAL TIME TAKING CARE OF THIS PATIENT: .   POSSIBLE D/C IN  1-2DAYS, DEPENDING ON CLINICAL CONDITION.   Katha Hamming M.D on 04/10/2016 at 11:06 AM  Between 7am to 6pm - Pager - [phone]  After 6pm go to [email or web address] - password [email or web address] [facility]  [place] [facility]  Office  [phone]  CC: Primary care physician; No PCP Per Patient   Note: This dictation was prepared with Dragon dictation along with smaller phrase technology. Any transcriptional errors that result from this process are unintentional.

## 2016-04-10 NOTE — Clinical Social Work Note (Signed)
CSW consulted by nurse due to readmissions and DKA. Rn CM has been notified. York SpanielMonica Quintan Saldivar MSW,LCSW [phone]

## 2016-04-10 NOTE — Plan of Care (Signed)
Problem: Food- and Nutrition-Related Knowledge Deficit (NB-1.1) Goal: Nutrition education Formal process to instruct or train a patient/client in a skill or to impart knowledge to help patients/clients voluntarily manage or modify food choices and eating behavior to maintain or improve health. Outcome: Completed/Met Date Met: 04/10/16  RD consulted for nutrition education regarding diabetes.   Lab Results  Component Value Date   HGBA1C 9.8 (H) 04/09/2016    RD provided "Carbohydrate Counting for People with Diabetes" handout from the Academy of [employer] and Dietetics. Discussed different food groups and their effects on blood sugar, emphasizing carbohydrate-containing foods. Provided list of carbohydrates and recommended serving sizes of common foods.  Discussed importance of controlled and consistent carbohydrate intake throughout the day. Provided examples of ways to balance meals/snacks and encouraged intake of high-fiber, whole grain complex carbohydrates. Teach back method used.  Expect poor compliance.  Body mass index is 20.89 kg/m. Pt meets criteria for normal based on current BMI.  Current diet order is carb mod, patient is consuming approximately 95-100% of meals at this time. Labs and medications reviewed. No further nutrition interventions warranted at this time. RD contact information provided. If additional nutrition issues arise, please re-consult RD.  Satira Anis. Danett Palazzo, MS, RD LDN Inpatient Clinical Dietitian Pager [phone]

## 2016-04-11 DIAGNOSIS — K529 Noninfective gastroenteritis and colitis, unspecified: Secondary | ICD-10-CM

## 2016-04-11 DIAGNOSIS — D72829 Elevated white blood cell count, unspecified: Secondary | ICD-10-CM

## 2016-04-11 DIAGNOSIS — E871 Hypo-osmolality and hyponatremia: Secondary | ICD-10-CM

## 2016-04-11 DIAGNOSIS — I959 Hypotension, unspecified: Secondary | ICD-10-CM

## 2016-04-11 LAB — GLUCOSE, CAPILLARY
Glucose-Capillary: 45 mg/dL — ABNORMAL LOW (ref 65–99)
Glucose-Capillary: 48 mg/dL — ABNORMAL LOW (ref 65–99)
Glucose-Capillary: 163 mg/dL — ABNORMAL HIGH (ref 65–99)
Glucose-Capillary: 130 mg/dL — ABNORMAL HIGH (ref 65–99)

## 2016-04-11 MED ORDER — DEXTROSE 50 % IV SOLN
INTRAVENOUS | Status: AC
  Administered 2016-04-11: 50 mL via INTRAVENOUS
  Filled 2016-04-11: qty 50

## 2016-04-11 MED ORDER — INSULIN ASPART PROT & ASPART (70-30 MIX) 100 UNIT/ML ~~LOC~~ SUSP: 26.0000 [IU] | Freq: Two times a day (BID) | SUBCUTANEOUS | 11 refills | Status: DC

## 2016-04-11 MED ORDER — INSULIN ASPART PROT & ASPART (70-30 MIX) 100 UNIT/ML ~~LOC~~ SUSP
26.0000 [IU] | Freq: Two times a day (BID) | SUBCUTANEOUS | Status: DC
  Administered 2016-04-11: 26 [IU] via SUBCUTANEOUS
  Filled 2016-04-11: qty 26

## 2016-04-11 MED ORDER — DEXTROSE 50 % IV SOLN
50.0000 mL | Freq: Once | INTRAVENOUS | Status: AC
  Administered 2016-04-11: 50 mL via INTRAVENOUS

## 2016-04-11 NOTE — Discharge Summary (Signed)
[facility] - [place] at [facility]   PATIENT NAME: Trevor Brown    MR#:  [id number]  DATE OF BIRTH:  12-02-1990  DATE OF ADMISSION:  04/09/2016 ADMITTING PHYSICIAN: Katharina Caper, MD  DATE OF DISCHARGE: 04/11/2016 11:09 AM  PRIMARY CARE PHYSICIAN: No PCP Per Patient     ADMISSION DIAGNOSIS:  Flank pain [R10.9]  DISCHARGE DIAGNOSIS:  Principal Problem:   DKA, type 1 (HCC) Active Problems:   DKA (diabetic ketoacidoses) (HCC)   Elevated transaminase level   Tobacco abuse counseling   Acute gastroenteritis   Hypotension   Hyponatremia   Leukocytosis   SECONDARY DIAGNOSIS:   Past Medical History:  Diagnosis Date  . Diabetes mellitus without complication (HCC)   . Heart attack (HCC)    Pt claims he had heart attack a year ago ( 2015)- and was admitted in [facility] for that, but not given any meds or angiogram, on review of chart- I could not find any details like that.  . Hepatitis C, acute may 2016  . Hypertension     .pro HOSPITAL COURSE:  Willian Donson  is a 25 y.o. 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 On arrival to the hospital. He was noted to have hyperglycemia with blood glucose levels above 400, his labs showed a mild acidosis. CO2 level of 20, hyponatremia, elevated transaminase levels, urine drug screen was positive for opiates, urinalysis revealed trace ketones. Patient admits of nausea, vomiting and abdominal pain, diarrhea. He states that he's been having nausea and vomiting for the past 2 days, diarrhea for one day. She was initiated on insulin IV drip and improved, converted into insulin 70/30. Insulin dose was advanced to 26 units twice daily due to hyperglycemia, however, patient was advised to  watch for hypoglycemia episodes closely, decrease medication dose to 24 units as he previously used if needed., globin A1c was checked was found to be 9.8,labs have improved. Patient's nausea, vomiting as well as diarrhea resolvedand we were not able to even get stool cultures. Discussion by problem: #1. Mild DKA with underlying diabetes type 1, hemoglobin A1c was 9.8, the patient was initially managed on insulin drip, IV fluids, following BMP in 4 hours, , but later resumed on 70/30 insulin at 26 units twice a day. The patient was advised to watch his blood glucose levels closely and decrease insulin dose if needed, if hypoglycemic.  #2. Hyponatremia, resolved on IV fluids, likely dehydration and pseudohyponatremia. #3. Acute gastroenteritis, resolved, we were not able to get stool for C. difficile studies, enteric pathogens. On the day of discharge, patient ate well and had no nausea and vomiting.  #4. Elevated transaminase level ,  due to underlying hep C, no significant difference from prior studies #5 hypotension, resolved with IV fluids. #6. Tobacco abuse counseling, discussed for 3 minutes, nicotine replacement therapy to be continued DISCHARGE CONDITIONS:   stable  CONSULTS OBTAINED:    DRUG ALLERGIES:   Allergies  Allergen Reactions  . Bee Venom Anaphylaxis  . Penicillins Anaphylaxis, Hives and Other (See Comments)    Has patient had a PCN reaction causing immediate rash, facial/tongue/throat swelling, SOB or lightheadedness with hypotension: Yes Has patient had a PCN reaction causing severe rash involving mucus membranes or skin necrosis: No Has patient  had a PCN reaction that required hospitalization No Has patient had a PCN reaction occurring within the last 10 years: Yes If all of the above answers are "NO", then may proceed with Cephalosporin use.  . Ibuprofen Other (See Comments)    Reaction:  GI upset   . Tramadol Hives and Swelling  . Vancomycin Rash and Other (See  Comments)    Reaction:  Red man's syndrome     DISCHARGE MEDICATIONS:   Discharge Medication List as of 04/11/2016  9:07 AM    CONTINUE these medications which have CHANGED   Details  insulin aspart protamine- aspart (NOVOLOG MIX 70/30) (70-30) 100 UNIT/ML injection Inject 0.26 mLs (26 Units total) into the skin 2 (two) times daily., Starting Thu 04/11/2016, Normal      CONTINUE these medications which have NOT CHANGED   Details  nicotine (NICODERM CQ - DOSED IN MG/24 HOURS) 21 mg/24hr patch Place 1 patch (21 mg total) onto the skin daily., Starting Wed 03/27/2016, Normal    oxyCODONE (OXY IR/ROXICODONE) 5 MG immediate release tablet Take 1 tablet (5 mg total) by mouth 4 (four) times daily as needed (moderate and severe pain)., Starting Wed 03/27/2016, Print    insulin aspart (NOVOLOG) 100 UNIT/ML injection 0-9 Units, Subcutaneous, 3 times daily before meals & bedtime, First dose on Fri 03/15/16 at 2200 Correction coverage: Sensitive (thin, NPO, renal) CBG < 70: implement hypoglycemia protocol CBG 70 - 120: 0 units CBG 121 - 150: 1 unit CBG 151 - 200: 2  units CBG 201 - 250: 3 units CBG 251 - 300: 5 units CBG 301 - 350: 7 units CBG 351 - 400: 9 units, Print      STOP taking these medications     sulfamethoxazole-trimethoprim (BACTRIM DS,SEPTRA DS) 800-160 MG tablet          DISCHARGE INSTRUCTIONS:    Patient is to follow-up with his primary care physician as outpatient  If you experience worsening of your admission symptoms, develop shortness of breath, life threatening emergency, suicidal or homicidal thoughts you must seek medical attention immediately by calling 911 or calling your MD immediately  if symptoms less severe.  You Must read complete instructions/literature along with all the possible adverse reactions/side effects for all the Medicines you take and that have been prescribed to you. Take any new Medicines after you have completely understood and accept all the  possible adverse reactions/side effects.   Please note  You were cared for by a hospitalist during your hospital stay. If you have any questions about your discharge medications or the care you received while you were in the hospital after you are discharged, you can call the unit and asked to speak with the hospitalist on call if the hospitalist that took care of you is not available. Once you are discharged, your primary care physician will handle any further medical issues. Please note that NO REFILLS for any discharge medications will be authorized once you are discharged, as it is imperative that you return to your primary care physician (or establish a relationship with a primary care physician if you do not have one) for your aftercare needs so that they can reassess your need for medications and monitor your lab values.    Today   CHIEF COMPLAINT:   Chief Complaint  Patient presents with  . Abdominal Pain    HISTORY OF PRESENT ILLNESS:  Sabas Frett  is a 25 y.o. 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 On arrival to the hospital. He was noted to have hyperglycemia with blood glucose levels above 400, his labs showed a mild acidosis. CO2 level of 20, hyponatremia, elevated transaminase levels, urine drug screen was positive for opiates, urinalysis revealed trace ketones. Patient admits of nausea, vomiting and abdominal pain, diarrhea. He states that he's been having nausea and vomiting for the past 2 days, diarrhea for one day. She was initiated on insulin IV drip and improved, converted into insulin 70/30. Insulin dose was advanced to 26 units twice daily due to hyperglycemia, however, patient was advised to watch for hypoglycemia episodes  closely, decrease medication dose to 24 units as he previously used if needed., globin A1c was checked was found to be 9.8,labs have improved. Patient's nausea, vomiting as well as diarrhea resolvedand we were not able to even get stool cultures. Discussion by problem: #1. Mild DKA with underlying diabetes type 1, hemoglobin A1c was 9.8, the patient was initially managed on insulin drip, IV fluids, following BMP in 4 hours, , but later resumed on 70/30 insulin at 26 units twice a day. The patient was advised to watch his blood glucose levels closely and decrease insulin dose if needed, if hypoglycemic.  #2. Hyponatremia, resolved on IV fluids, likely dehydration and pseudohyponatremia. #3. Acute gastroenteritis, resolved, we were not able to get stool for C. difficile studies, enteric pathogens. On the day of discharge, patient ate well and had no nausea and vomiting.  #4. Elevated transaminase level ,  due to underlying hep C, no significant difference from prior studies #5 hypotension, resolved with IV fluids. #6. Tobacco abuse counseling, discussed for 3 minutes, nicotine replacement therapy to be continued   VITAL SIGNS:  Blood pressure 140/78, pulse 87, temperature 97.9 F (36.6 C), temperature source Axillary, resp. rate 18, height 5\' 4"  (1.626 m), weight 55.2 kg (121 lb 11.1 oz), SpO2 100 %.  I/O:   Intake/Output Summary (Last 24 hours) at 04/11/16 1151 Last data filed at 04/11/16 0900  Gross per 24 hour  Intake          2112.02 ml  Output             2600 ml  Net          -487.98 ml    PHYSICAL EXAMINATION:  GENERAL:  25 y.o.-year-old patient lying in the bed with no acute distress.  EYES: Pupils equal, round, reactive to light and accommodation. No scleral icterus. Extraocular muscles intact.  HEENT: Head atraumatic, normocephalic. Oropharynx and nasopharynx clear.  NECK:  Supple, no jugular venous distention. No thyroid enlargement, no tenderness.  LUNGS: Normal breath sounds  bilaterally, no wheezing, rales,rhonchi or crepitation. No use of accessory muscles of respiration.  CARDIOVASCULAR: S1, S2 normal. No murmurs, rubs, or gallops.  ABDOMEN: Soft, non-tender, non-distended. Bowel sounds present. No organomegaly or mass.  EXTREMITIES: No pedal edema, cyanosis, or clubbing.  NEUROLOGIC: Cranial nerves II through XII are intact. Muscle strength 5/5 in all extremities. Sensation intact. Gait not checked.  PSYCHIATRIC: The patient is alert and oriented x 3.  SKIN: No obvious rash, lesion, or ulcer.   DATA REVIEW:   CBC  Recent Labs Lab 04/10/16 0558  WBC 11.2*  HGB 12.4*  HCT 36.0*  PLT 298    Chemistries   Recent  Labs Lab 04/09/16 0958 04/09/16 1703  04/10/16 1435  NA 129*  --   < > 135  K 4.3  --   < > 3.6  CL 93*  --   < > 105  CO2 20*  --   < > 24  GLUCOSE 438*  --   < > 209*  BUN 16  --   < > 15  CREATININE 0.93  --   < > 0.75  CALCIUM 9.3  --   < > 8.6*  MG  --  1.8  --   --   AST 107*  --   --   --   ALT 194*  --   --   --   ALKPHOS 291*  --   --   --   BILITOT 1.5*  --   --   --   < > = values in this interval not displayed.  Cardiac Enzymes  Recent Labs Lab 04/10/16 0558  TROPONINI <0.03    Microbiology Results  Results for orders placed or performed during the hospital encounter of 04/09/16  Urine culture     Status: None   Collection Time: 04/09/16 10:57 AM  Result Value Ref Range Status   Specimen Description URINE, CLEAN CATCH  Final   Special Requests NONE  Final   [facility]   Final   Report Status 04/10/2016 FINAL  Final  MRSA PCR Screening     Status: None   Collection Time: 04/09/16  5:03 PM  Result Value Ref Range Status   MRSA by PCR NEGATIVE NEGATIVE Final    Comment:        The GeneXpert MRSA Assay (FDA approved for NASAL specimens only), is one component of a comprehensive MRSA colonization surveillance program. It is not intended to diagnose  MRSA infection nor to guide or monitor treatment for MRSA infections.     RADIOLOGY:  Ct Renal Stone Study  Result Date: 04/09/2016 CLINICAL DATA:  Left flank pain for 3 days EXAM: CT ABDOMEN AND PELVIS WITHOUT CONTRAST TECHNIQUE: Multidetector CT imaging of the abdomen and pelvis was performed following the standard protocol without IV contrast. COMPARISON:  03/25/2016 FINDINGS: Lower chest: No acute abnormality. Hepatobiliary: No focal liver abnormality is seen. No gallstones, gallbladder wall thickening, or biliary dilatation. Pancreas: Unremarkable. No pancreatic ductal dilatation or surrounding inflammatory changes. Spleen: Normal in size without focal abnormality. Adrenals/Urinary Tract: The adrenal glands are within normal limits. A tiny 1-2 mm nonobstructing stone is noted within the right kidney. No obstructive changes are identified. No left-sided calculi are seen. The bladder is well distended. Stomach/Bowel: Stomach is within normal limits. Appendix appears normal. No evidence of bowel wall thickening, distention, or inflammatory changes. Vascular/Lymphatic: No significant vascular findings are present. No enlarged abdominal or pelvic lymph nodes. Reproductive: Prostate is unremarkable. Other: No abdominal wall hernia or abnormality. No abdominopelvic ascites. Musculoskeletal: No acute or significant osseous findings. IMPRESSION: Small nonobstructing right renal stone. No other focal abnormality is noted. Electronically Signed   By: Alcide CleverMark  Lukens M.D.   On: 04/09/2016 13:44    EKG:   Orders placed or performed during the hospital encounter of 03/25/16  . ED EKG  . ED EKG  . EKG 12-Lead  . EKG 12-Lead      Management plans discussed with the patient, family and they are in agreement.  CODE STATUS:     Code Status Orders  Start     Ordered   04/09/16 1655  Full code  Continuous     04/09/16 1654    Code Status History    Date Active Date Inactive Code Status Order  ID Comments User Context   03/26/2016  1:18 AM 03/27/2016  7:11 PM Full Code [id number]  Tonye Royalty, DO Inpatient   03/18/2016  8:15 AM 03/21/2016  8:35 PM Full Code [id number]  Wyatt Haste, MD ED   03/12/2016 12:20 PM 03/16/2016  7:27 PM Full Code [id number]  Enedina Finner, MD Inpatient   02/18/2016  4:59 PM 02/19/2016  5:18 PM Full Code [id number]  Ramonita Lab, MD ED   01/12/2016  8:58 AM 01/15/2016  7:33 PM Full Code [id number]  Milagros Loll, MD ED   01/11/2016 12:35 AM 01/11/2016 11:20 PM Full Code [id number]  Gery Pray, MD Inpatient   12/12/2015 10:04 PM 12/14/2015  6:38 PM Full Code [id number]  Katharina Caper, MD Inpatient   10/23/2015  5:42 PM 10/25/2015  7:04 PM Full Code [id number]  Katha Hamming, MD ED   06/10/2015  8:20 AM 06/12/2015  2:41 PM Full Code [id number]  Arnaldo Natal, MD Inpatient   06/06/2015  8:43 PM 06/07/2015  4:20 PM Full Code [id number]  Enedina Finner, MD Inpatient   04/07/2015 12:45 PM 04/08/2015  8:37 PM Full Code [id number]  Altamese Dilling, MD Inpatient   12/09/2014  2:44 PM 12/13/2014  4:16 PM Full Code [id number]  Gale Journey, MD Inpatient      TOTAL TIME TAKING CARE OF THIS PATIENT: 40 minutes.    Katharina Caper M.D on 04/11/2016 at 11:51 AM  Between 7am to 6pm - Pager - [phone]  After 6pm go to [email or web address] - password [email or web address] [facility]  [facility]  Office  [phone]  CC: Primary care physician; No PCP Per Patient

## 2016-04-11 NOTE — Care Management (Signed)
[facility] than the patient is. This RNCM received this response from Medication Management pharmacist: He is active here and we fill what we can for him. We don't ever have any Novolog 70/30 in stock and since we need financial documentation in order to get this via PAP we have been calling MD each time and getting the RX changed to Novolin 70/30. This is not ideal but we do provide it each time he comes in with a script. We have told him we need this documentation each time he comes in.  I hope this helps and please contact me if you have any other questions.  Thank you,  Christan T. Leonor LivHolt, PharmD, RPh.  Clinical Pharmacist

## 2016-04-11 NOTE — Progress Notes (Signed)
FSBS result of 48 with repeat of 45. Dextrose 1 amp given. Dr. Sheryle Hailiamond paged and verbal order received for Dextrose. Recheck FSBS in 30 minutes. Given graham crackers and milk also.

## 2016-04-11 NOTE — Progress Notes (Signed)
Discharge summary reviewed w/ patient with verbal understanding. 1 RX given upon discharge. Pt stated he was going outside to get fresh then he was coming back to visit with his father who was also admitted to this facility. Explanation given to patient that he was discharged and would not come back this room, verbalized understanding.

## 2016-06-11 ENCOUNTER — Ambulatory Visit: Payer: Self-pay | Admitting: Pharmacy Technician

## 2016-06-11 ENCOUNTER — Emergency Department
Admission: EM | Admit: 2016-06-11 | Discharge: 2016-06-11 | Disposition: A | Discharge status: Home / Self Care | Payer: Medicaid Other | Attending: Emergency Medicine | Admitting: Emergency Medicine

## 2016-06-11 DIAGNOSIS — Z79899 Other long term (current) drug therapy: Secondary | ICD-10-CM

## 2016-06-11 DIAGNOSIS — Z91199 Patient's noncompliance with other medical treatment and regimen due to unspecified reason: Secondary | ICD-10-CM

## 2016-06-11 DIAGNOSIS — I10 Essential (primary) hypertension: Secondary | ICD-10-CM | POA: Insufficient documentation

## 2016-06-11 DIAGNOSIS — F17210 Nicotine dependence, cigarettes, uncomplicated: Secondary | ICD-10-CM | POA: Insufficient documentation

## 2016-06-11 DIAGNOSIS — R739 Hyperglycemia, unspecified: Secondary | ICD-10-CM

## 2016-06-11 DIAGNOSIS — Z9119 Patient's noncompliance with other medical treatment and regimen: Secondary | ICD-10-CM

## 2016-06-11 DIAGNOSIS — E1010 Type 1 diabetes mellitus with ketoacidosis without coma: Secondary | ICD-10-CM | POA: Insufficient documentation

## 2016-06-11 DIAGNOSIS — E1065 Type 1 diabetes mellitus with hyperglycemia: Secondary | ICD-10-CM | POA: Insufficient documentation

## 2016-06-11 LAB — GLUCOSE, CAPILLARY
Glucose-Capillary: >600 mg/dL (ref 65–99)
Glucose-Capillary: 587 mg/dL (ref 65–99)
Glucose-Capillary: >600 mg/dL (ref 65–99)
Glucose-Capillary: 369 mg/dL — ABNORMAL HIGH (ref 65–99)
Glucose-Capillary: 255 mg/dL — ABNORMAL HIGH (ref 65–99)

## 2016-06-11 LAB — BASIC METABOLIC PANEL
Anion gap: 16 — ABNORMAL HIGH (ref 5–15)
Anion gap: 6 (ref 5–15)
BUN: 25 mg/dL — ABNORMAL HIGH (ref 6–20)
BUN: 19 mg/dL (ref 6–20)
CO2: 25 mmol/L (ref 22–32)
CO2: 26 mmol/L (ref 22–32)
Calcium: 10.3 mg/dL (ref 8.9–10.3)
Calcium: 9.1 mg/dL (ref 8.9–10.3)
Chloride: 95 mmol/L — ABNORMAL LOW (ref 101–111)
Chloride: 107 mmol/L (ref 101–111)
Creatinine, Ser: 1.13 mg/dL (ref 0.61–1.24)
Creatinine, Ser: 0.82 mg/dL (ref 0.61–1.24)
GFR calc Af Amer: >60 mL/min (ref >60)
GFR calc Af Amer: >60 mL/min (ref >60)
GFR calc non Af Amer: >60 mL/min (ref >60)
GFR calc non Af Amer: >60 mL/min (ref >60)
Glucose, Bld: 709 mg/dL (ref 65–99)
Glucose, Bld: 335 mg/dL — ABNORMAL HIGH (ref 65–99)
Potassium: 5.1 mmol/L (ref 3.5–5.1)
Potassium: 4 mmol/L (ref 3.5–5.1)
Sodium: 136 mmol/L (ref 135–145)
Sodium: 139 mmol/L (ref 135–145)

## 2016-06-11 LAB — URINE DRUG SCREEN, QUALITATIVE (ARMC ONLY)
Amphetamines, Ur Screen: NOT DETECTED
Barbiturates, Ur Screen: NOT DETECTED
Benzodiazepine, Ur Scrn: NOT DETECTED
Cannabinoid 50 Ng, Ur ~~LOC~~: NOT DETECTED
Cocaine Metabolite,Ur ~~LOC~~: NOT DETECTED
MDMA (Ecstasy)Ur Screen: NOT DETECTED
Methadone Scn, Ur: NOT DETECTED
Opiate, Ur Screen: NOT DETECTED
Phencyclidine (PCP) Ur S: NOT DETECTED
Tricyclic, Ur Screen: NOT DETECTED

## 2016-06-11 LAB — CBC
HCT: 40.6 % (ref 40.0–52.0)
Hemoglobin: 13.8 g/dL (ref 13.0–18.0)
MCH: 30 pg (ref 26.0–34.0)
MCHC: 33.9 g/dL (ref 32.0–36.0)
MCV: 88.6 fL (ref 80.0–100.0)
Platelets: 203 10*3/uL (ref 150–440)
RBC: 4.58 MIL/uL (ref 4.40–5.90)
RDW: 13.1 % (ref 11.5–14.5)
WBC: 11.4 10*3/uL — ABNORMAL HIGH (ref 3.8–10.6)

## 2016-06-11 LAB — URINALYSIS COMPLETE WITH MICROSCOPIC (ARMC ONLY)
Bacteria, UA: NONE SEEN
Bilirubin Urine: NEGATIVE
Glucose, UA: >500 mg/dL — AB
Hgb urine dipstick: NEGATIVE
Leukocytes, UA: NEGATIVE
Nitrite: NEGATIVE
Protein, ur: NEGATIVE mg/dL
RBC / HPF: NONE SEEN RBC/hpf (ref 0–5)
Specific Gravity, Urine: 1.024 (ref 1.005–1.030)
Squamous Epithelial / LPF: NONE SEEN
pH: 5 (ref 5.0–8.0)

## 2016-06-11 MED ORDER — INSULIN ASPART 100 UNIT/ML ~~LOC~~ SOLN
10.0000 [IU] | Freq: Once | SUBCUTANEOUS | Status: AC
  Administered 2016-06-11: 10 [IU] via SUBCUTANEOUS
  Filled 2016-06-11: qty 10

## 2016-06-11 MED ORDER — SODIUM CHLORIDE 0.9 % IV SOLN
INTRAVENOUS | Status: DC
  Administered 2016-06-11: 12:00:00 via INTRAVENOUS

## 2016-06-11 MED ORDER — SODIUM CHLORIDE 0.9 % IV SOLN
INTRAVENOUS | Status: DC
  Administered 2016-06-11: 5.4 [IU]/h via INTRAVENOUS
  Filled 2016-06-11: qty 2.5

## 2016-06-11 MED ORDER — INSULIN REGULAR BOLUS VIA INFUSION
0.0000 [IU] | Freq: Three times a day (TID) | INTRAVENOUS | Status: DC
  Filled 2016-06-11: qty 10

## 2016-06-11 MED ORDER — SODIUM CHLORIDE 0.9 % IV BOLUS (SEPSIS)
1000.0000 mL | Freq: Once | INTRAVENOUS | Status: AC
  Administered 2016-06-11: 1000 mL via INTRAVENOUS

## 2016-06-11 MED ORDER — DEXTROSE 50 % IV SOLN: 25.0000 mL | INTRAVENOUS | Status: DC | PRN

## 2016-06-11 MED ORDER — DEXTROSE-NACL 5-0.45 % IV SOLN
INTRAVENOUS | Status: DC
  Administered 2016-06-11: 12:00:00 via INTRAVENOUS

## 2016-06-11 NOTE — ED Triage Notes (Signed)
Pt states he has been out of his insulin since yesterday morning and today having N/V, thinks his glucose is high..Marland Kitchen

## 2016-06-11 NOTE — ED Notes (Signed)
Pt called out stating his face had become more swollen. Lower jaw line was noted to be swollen earlier before pt called out. Pt states he has multiple teeth issues with abscesses and injured nerves. Teeth appear yellow, broken upon assessment.

## 2016-06-11 NOTE — ED Notes (Signed)
Pt informed to go to [facility] about insulin and to go to [facility] to get work done on his teeth. Pt provided other dentist options but told that people have talked to [facility] about him. Pt informed to follow up with [facility] as [facility]. Given information on [facility] as well as dental clinics. Pt verbalized understanding of follow ups

## 2016-06-11 NOTE — ED Provider Notes (Addendum)
[facility] Emergency Department Provider Note  ____________________________________________   I have reviewed the triage vital signs and the nursing notes.   HISTORY  Chief Complaint Hyperglycemia    HPI Trevor Brown is a 25 y.o. male who presents to complaining of "running out" of his insulin again. Patient states he does not continue to use IV drugs, fortunately, patient has a history of noncompliance in the past. He states he stopped using his insulin 2 days ago began feeling hyperglycemic and unwell yesterday. No fever no chills. Did have 1 small upset of vomiting earlier today. He has been set up as an outpatient for extensive help including [facility] which he has no showed according to social work, as well as access to free insulin.      Past Medical History:  Diagnosis Date  . Diabetes mellitus without complication (HCC)   . Heart attack    Pt claims he had heart attack a year ago ( 2015)- and was admitted in [facility] for that, but not given any meds or angiogram, on review of chart- I could not find any details like that.  . Hepatitis C, acute may 2016  . Hypertension     Patient Active Problem List   Diagnosis Date Noted  . Acute gastroenteritis 04/11/2016  . Hypotension 04/11/2016  . Hyponatremia 04/11/2016  . Leukocytosis 04/11/2016  . Elevated transaminase level 03/27/2016  . Diffuse abdominal pain 03/27/2016  . Anemia 03/27/2016  . Diabetes mellitus type 1 (HCC) 03/27/2016  . Tobacco abuse counseling 03/27/2016  . Transaminitis 03/25/2016  . Tobacco abuse 01/10/2016  . Type 1 diabetes mellitus with hyperglycemia (HCC) 12/12/2015  . Cocaine abuse 12/12/2015  . DKA (diabetic ketoacidoses) (HCC) 06/10/2015  . Hidradenitis suppurativa of left axilla   . DKA, type 1 (HCC) 06/06/2015  . Malnutrition of moderate degree (HCC) 04/08/2015  . Hepatitis C 12/13/2014  . Diabetes type 1, uncontrolled  (HCC) 12/11/2014  . Major depressive disorder, single episode, mild (HCC)   . Major depression, single episode 12/10/2014  . Diabetes mellitus type 1, uncontrolled (HCC) 12/09/2014  . Homelessness 12/09/2014    History reviewed. No pertinent surgical history.  Prior to Admission medications   Medication Sig Start Date End Date Taking? Authorizing Provider  insulin aspart (NOVOLOG) 100 UNIT/ML injection 0-9 Units, Subcutaneous, 3 times daily before meals & bedtime, First dose on Fri 03/15/16 at 2200 Correction coverage: Sensitive (thin, NPO, renal) CBG < 70: implement hypoglycemia protocol CBG 70 - 120: 0 units CBG 121 - 150: 1 unit CBG 151 - 200: 2 units CBG 201 - 250: 3 units CBG 251 - 300: 5 units CBG 301 - 350: 7 units CBG 351 - 400: 9 units 03/16/16  Yes Wyatt Haste, MD  insulin aspart protamine- aspart (NOVOLOG MIX 70/30) (70-30) 100 UNIT/ML injection Inject 0.26 mLs (26 Units total) into the skin 2 (two) times daily. Patient taking differently: Inject 30 Units into the skin 2 (two) times daily.  04/11/16  Yes Katharina Caper, MD  oxyCODONE (OXY IR/ROXICODONE) 5 MG immediate release tablet Take 1 tablet (5 mg total) by mouth 4 (four) times daily as needed (moderate and severe pain). 03/27/16  Yes Katharina Caper, MD    Allergies Bee venom; Penicillins; Ibuprofen; Tramadol; and Vancomycin  Family History  Problem Relation Age of Onset  . Cirrhosis Mother   . Diabetes Mellitus II Maternal Grandmother     Social History Social History  Substance Use  Topics  . Smoking status: Current Every Day Smoker    Packs/day: 2.00    Types: Cigarettes  . Smokeless tobacco: Never Used  . Alcohol use No    Review of Systems Constitutional: No fever/chills Eyes: No visual changes. ENT: No sore throat. No stiff neck no neck pain Cardiovascular: Denies chest pain. Respiratory: Denies shortness of breath. Gastrointestinal:   Positive vomiting.  No diarrhea.  No  constipation. Genitourinary: Negative for dysuria. Musculoskeletal: Negative lower extremity swelling Skin: Negative for rash. Neurological: Negative for severe headaches, focal weakness or numbness. 10-point ROS otherwise negative.  ____________________________________________   PHYSICAL EXAM:  VITAL SIGNS: ED Triage Vitals  Enc Vitals Group     BP 06/11/16 1025 137/81     Pulse Rate 06/11/16 1025 (!) 104     Resp 06/11/16 1025 18     Temp 06/11/16 1025 97.9 F (36.6 C)     Temp Source 06/11/16 1025 Oral     SpO2 06/11/16 1025 100 %     Weight 06/11/16 1026 140 lb (63.5 kg)     Height 06/11/16 1026 5\' 4"  (1.626 m)     Head Circumference --      Peak Flow --      Pain Score 06/11/16 1026 10     Pain Loc --      Pain Edu? --      Excl. in GC? --     Constitutional: Alert and oriented. Well appearing and in no acute distress. Eyes: Conjunctivae are normal. PERRL. EOMI. Head: Atraumatic. Nose: No congestion/rhinnorhea. Mouth/Throat: Mucous membranes are Dry.  Oropharynx non-erythematous.There is very poor dentition but no evidence of Ludwig angina or acute infection Neck: No stridor.   Nontender with no meningismus Cardiovascular: Normal rate, regular rhythm. Grossly normal heart sounds.  Good peripheral circulation. Respiratory: Normal respiratory effort.  No retractions. Lungs CTAB. Abdominal: Soft and nontender. No distention. No guarding no rebound Back:  There is no focal tenderness or step off.  there is no midline tenderness there are no lesions noted. there is no CVA tenderness Musculoskeletal: No lower extremity tenderness, no upper extremity tenderness. No joint effusions, no DVT signs strong distal pulses no edema Neurologic:  Normal speech and language. No gross focal neurologic deficits are appreciated.  Skin:  Skin is warm, dry and intact. No rash noted. Psychiatric: Mood and affect are normal. Speech and behavior are  normal.  ____________________________________________   LABS (all labs ordered are listed, but only abnormal results are displayed)  Labs Reviewed  BASIC METABOLIC PANEL - Abnormal; Notable for the following:       Result Value   Chloride 95 (*)    Glucose, Bld 709 (*)    BUN 25 (*)    Anion gap 16 (*)    All other components within normal limits  CBC - Abnormal; Notable for the following:    WBC 11.4 (*)    All other components within normal limits  URINALYSIS COMPLETEWITH MICROSCOPIC ([facility] ONLY) - Abnormal; Notable for the following:    Color, Urine COLORLESS (*)    APPearance CLEAR (*)    Glucose, UA >500 (*)    Ketones, ur 2+ (*)    All other components within normal limits  GLUCOSE, CAPILLARY - Abnormal; Notable for the following:    Glucose-Capillary >600 (*)    All other components within normal limits  GLUCOSE, CAPILLARY - Abnormal; Notable for the following:    Glucose-Capillary 587 (*)    All other components  within normal limits  GLUCOSE, CAPILLARY - Abnormal; Notable for the following:    Glucose-Capillary >600 (*)    All other components within normal limits  URINE DRUG SCREEN, QUALITATIVE ([facility] ONLY)  CBG MONITORING, ED   ____________________________________________  EKG  I personally interpreted any EKGs ordered by me or triage ____________________________________  RADIOLOGY  I reviewed any imaging ordered by me or triage that were performed during my shift and, if possible, patient and/or family made aware of any abnormal findings. ____________________________________________   PROCEDURES  Procedure(s) performed: None  Procedures  Critical Care performed: None  ____________________________________________   INITIAL IMPRESSION / ASSESSMENT AND PLAN / ED COURSE  Pertinent labs & imaging results that were available during my care of the patient were reviewed by me and considered in my medical decision making (see chart for  details).  Asian her the elevated glucose in the context of noncompliance however his anion gap is only 16 suggesting very mild acidosis. We will give him IV fluids, he does  2+ ketones. This is likely very mild DKA in the context of noncompliant hyperglycemia. It is my hope that with IV fluids and insulin we can close his gap and get him safely home with insulin as an outpatient. He is quite well appearing.  ----------------------------------------- 2:51 PM on 06/11/2016 -----------------------------------------  Patient feeling better. He does have poor dentition there is no evidence of Ludwig's angina or acute infection. This is been chronically noted for many years. He knows that he is [facility] and he states he will do so. His gap at this time is closed. If we discharge him at this time, he will have close outpatient follow-up with the clinic directly across the street where he can get his insulin he does not require perception from us. I have been in discussion with case management about this. Patient was in mild DKA from noncompliance. At this time his sugars are in the low 300s and he is eating and drinking with no evidence of nausea or vomiting. He would prefer to go home. We will discharge him. Lites are reassuring.    Clinical Course    ____________________________________________   FINAL CLINICAL IMPRESSION(S) / ED DIAGNOSES  Final diagnoses:  None      This chart was dictated using voice recognition software.  Despite best efforts to proofread,  errors can occur which can change meaning.      Jeanmarie PlantJames A Danzig Macgregor, MD 06/11/16 1228    Jeanmarie PlantJames A Lianette Broussard, MD 06/11/16 [phone]

## 2016-06-11 NOTE — Discharge Instructions (Signed)
Go directly across the street and get your insulin. Return to the emergency department for new or worrisome symptoms. [facility].

## 2016-06-11 NOTE — ED Notes (Signed)
Pt states his teeth are hurting him too much to eat crackers/peanut butter so this Rn gave him ice cream to try to eat. Will cover the carbs in glucostabilizer once pt eats the ice cream. Pt also has a drink at bedside.

## 2016-06-11 NOTE — ED Notes (Signed)
Spoke to AlderLaura, Charity fundraiserN about pt's compliancy with his insulin. She stated that Elnita Maxwellheryl, case worker spoke to pt earlier about his insulin compliancy and use.   Pt states he gets his insulin from [facility] across the street. Pt states she ran out of insulin yesterday, as in took his last dose yesterday.

## 2016-06-11 NOTE — Care Management Note (Signed)
Case Management Note  Patient Details  Name: Emeline Darlingony B Rawlinson MRN: [id number] Date of Birth: 06-05-1991  Subjective/Objective:      Called [facility] clinic across the street. [place] S[place] in the pharmacy has confirmed that the patient got 3 vials/month supply of Novolin 70/30 on September the first. I then saw the patient , and he says that he and his dad recently moved and the November insulin got left behind. [facility], and he answers that he just has not had time in the last 3 months to call or go.  Action/Plan:   Expected Discharge Date:                  Expected Discharge Plan:     In-House Referral:     Discharge planning Services     Post Acute Care Choice:    Choice offered to:     DME Arranged:    DME Agency:     HH Arranged:    HH Agency:     Status of Service:     If discussed at [employer] Length of Stay Meetings, dates discussed:    Additional Comments:  Berna BueCheryl Tri Chittick, RN 06/11/2016, 10:47 AM

## 2016-06-11 NOTE — ED Notes (Addendum)
Went back into pt room to clean room, pt left DC papers and information on dental clinics and [facility] all laying on bed. Given to first nurse in case pt returns for them.

## 2016-06-11 NOTE — ED Notes (Signed)
CBG did not cross over, it read 369.

## 2016-06-11 NOTE — Progress Notes (Signed)
Inpatient Diabetes Program Recommendations  AACE/ADA: New Consensus Statement on Inpatient Glycemic Control (2015)  Target Ranges:  Prepandial:   less than 140 mg/dL      Peak postprandial:   less than 180 mg/dL (1-2 hours)      Critically ill patients:  140 - 180 mg/dL   Lab Results  Component Value Date   GLUCAP >600 (HH) 06/11/2016   HGBA1C 9.8 (H) 04/09/2016    Review of Glycemic ControlResults for [name], Trevor B (MRN [id number]) as of 06/11/2016 14:04  Ref. Range 06/11/2016 10:29 06/11/2016 10:30 06/11/2016 12:03  Glucose-Capillary Latest Ref Range: 65 - 99 mg/dL >119>600 (HH) 147587 (HH) >829>600 (HH)   Diabetes history: Type 1 diabetes Outpatient Diabetes medications: Novolog 70/30 mix 30 units bid Current orders for Inpatient glycemic control:  DKA order set/IV insulin  Inpatient Diabetes Program Recommendations:    Continue IV insulin until AG closed and acidosis cleared.  Note that patient is continuing to complain of mouth and dental pain.    Thanks, Beryl MeagerJenny Melchor Kirchgessner, RN, BC-ADM Inpatient Diabetes Coordinator Pager [phone] (8a-5p)

## 2016-06-11 NOTE — Progress Notes (Signed)
Patient came to clinic with prescription from ED.   Beryl MeagerKeri Harrison explained to patient that we do not always [facility]ul[facility] at [facility].  We obtain the insulins from [facility] [employer].  Keri also explained that it was critical for patient to complete eligibility appt to prevent a break in his medication therapy.   I attempted to speak to patient about completing our application and inform him of the proof of income needed while he was waiting for his medication to be filled.  Patient declined.  Stated that he needed [facility].  Offered to reschedule an eligibility appointment and to [facility].  Patient declined.  In addition, patient did not want me to reach out to [facility] or [facility] to try to arrange an appointment for him.  Patient was provided referral information for [facility] and [facility].  Patient's eyes appeared to be blood-shot and glassy.  Patient seemed very agitated and nervous.  Also, very impatient.  In a hurry to leave.           Sherilyn DacostaBetty J. Rever Pichette Care Manager [facility]

## 2016-06-11 NOTE — ED Notes (Signed)
Patient presents with general malaise. Reports taking last of his insulin yesterday. Also complaining of sores in mouth which he reports is a chronic issue associated with poor CBG control. Patient is A&O x4. History of DKA with similar symptoms.

## 2016-06-11 NOTE — ED Notes (Signed)
Dr. Alphonzo LemmingsMcShane informed that this RN and Clydie BraunKaren, RN spoke to diabetes coordinator, Misty StanleyLisa who recommends [facility].   Pt states he gets his insulin from the medication center across the street from the hospital and that he runs out all the time.

## 2016-06-18 LAB — GLUCOSE, CAPILLARY: Glucose-Capillary: 400 mg/dL — ABNORMAL HIGH (ref 65–99)

## 2016-07-03 ENCOUNTER — Emergency Department
Admission: EM | Admit: 2016-07-03 | Discharge: 2016-07-03 | Disposition: A | Discharge status: Home / Self Care | Payer: Self-pay | Attending: Emergency Medicine | Admitting: Emergency Medicine

## 2016-07-03 ENCOUNTER — Encounter: Payer: Self-pay | Admitting: Emergency Medicine

## 2016-07-03 DIAGNOSIS — E1010 Type 1 diabetes mellitus with ketoacidosis without coma: Secondary | ICD-10-CM | POA: Insufficient documentation

## 2016-07-03 DIAGNOSIS — Z91199 Patient's noncompliance with other medical treatment and regimen due to unspecified reason: Secondary | ICD-10-CM

## 2016-07-03 DIAGNOSIS — F1410 Cocaine abuse, uncomplicated: Secondary | ICD-10-CM | POA: Insufficient documentation

## 2016-07-03 DIAGNOSIS — I10 Essential (primary) hypertension: Secondary | ICD-10-CM | POA: Insufficient documentation

## 2016-07-03 DIAGNOSIS — Z9119 Patient's noncompliance with other medical treatment and regimen: Secondary | ICD-10-CM

## 2016-07-03 DIAGNOSIS — F17210 Nicotine dependence, cigarettes, uncomplicated: Secondary | ICD-10-CM | POA: Insufficient documentation

## 2016-07-03 DIAGNOSIS — Z5181 Encounter for therapeutic drug level monitoring: Secondary | ICD-10-CM | POA: Insufficient documentation

## 2016-07-03 LAB — URINALYSIS, COMPLETE (UACMP) WITH MICROSCOPIC
Bacteria, UA: NONE SEEN
Bilirubin Urine: NEGATIVE
Glucose, UA: >=500 mg/dL — AB
Hgb urine dipstick: NEGATIVE
Ketones, ur: 80 mg/dL — AB
Leukocytes, UA: NEGATIVE
Nitrite: NEGATIVE
Protein, ur: NEGATIVE mg/dL
RBC / HPF: NONE SEEN RBC/hpf (ref 0–5)
Specific Gravity, Urine: 1.026 (ref 1.005–1.030)
Squamous Epithelial / LPF: NONE SEEN
WBC, UA: NONE SEEN WBC/hpf (ref 0–5)
pH: 5 (ref 5.0–8.0)

## 2016-07-03 LAB — CBC WITH DIFFERENTIAL/PLATELET
Basophils Absolute: 0 10*3/uL (ref 0–0.1)
Basophils Relative: 1 %
Eosinophils Absolute: 0 10*3/uL (ref 0–0.7)
Eosinophils Relative: 0 %
HCT: 43.5 % (ref 40.0–52.0)
Hemoglobin: 14.7 g/dL (ref 13.0–18.0)
Lymphocytes Relative: 16 %
Lymphs Abs: 1.4 10*3/uL (ref 1.0–3.6)
MCH: 30 pg (ref 26.0–34.0)
MCHC: 33.8 g/dL (ref 32.0–36.0)
MCV: 88.8 fL (ref 80.0–100.0)
Monocytes Absolute: 0.3 10*3/uL (ref 0.2–1.0)
Monocytes Relative: 4 %
Neutro Abs: 6.7 10*3/uL — ABNORMAL HIGH (ref 1.4–6.5)
Neutrophils Relative %: 79 %
Platelets: 291 10*3/uL (ref 150–440)
RBC: 4.9 MIL/uL (ref 4.40–5.90)
RDW: 12.9 % (ref 11.5–14.5)
WBC: 8.4 10*3/uL (ref 3.8–10.6)

## 2016-07-03 LAB — COMPREHENSIVE METABOLIC PANEL
ALT: 19 U/L (ref 17–63)
AST: 24 U/L (ref 15–41)
Albumin: 4.5 g/dL (ref 3.5–5.0)
Alkaline Phosphatase: 200 U/L — ABNORMAL HIGH (ref 38–126)
Anion gap: 19 — ABNORMAL HIGH (ref 5–15)
BUN: 19 mg/dL (ref 6–20)
CO2: 17 mmol/L — ABNORMAL LOW (ref 22–32)
Calcium: 9.5 mg/dL (ref 8.9–10.3)
Chloride: 93 mmol/L — ABNORMAL LOW (ref 101–111)
Creatinine, Ser: 1.08 mg/dL (ref 0.61–1.24)
GFR calc Af Amer: >60 mL/min (ref >60)
GFR calc non Af Amer: >60 mL/min (ref >60)
Glucose, Bld: 559 mg/dL (ref 65–99)
Potassium: 5.1 mmol/L (ref 3.5–5.1)
Sodium: 129 mmol/L — ABNORMAL LOW (ref 135–145)
Total Bilirubin: 1.4 mg/dL — ABNORMAL HIGH (ref 0.3–1.2)
Total Protein: 8.2 g/dL — ABNORMAL HIGH (ref 6.5–8.1)

## 2016-07-03 LAB — URINE DRUG SCREEN, QUALITATIVE (ARMC ONLY)
Amphetamines, Ur Screen: NOT DETECTED
Barbiturates, Ur Screen: NOT DETECTED
Benzodiazepine, Ur Scrn: NOT DETECTED
Cannabinoid 50 Ng, Ur ~~LOC~~: NOT DETECTED
Cocaine Metabolite,Ur ~~LOC~~: POSITIVE — AB
MDMA (Ecstasy)Ur Screen: NOT DETECTED
Methadone Scn, Ur: NOT DETECTED
Opiate, Ur Screen: POSITIVE — AB
Phencyclidine (PCP) Ur S: NOT DETECTED
Tricyclic, Ur Screen: NOT DETECTED

## 2016-07-03 LAB — BASIC METABOLIC PANEL
Anion gap: 10 (ref 5–15)
BUN: 17 mg/dL (ref 6–20)
CO2: 18 mmol/L — ABNORMAL LOW (ref 22–32)
Calcium: 8.6 mg/dL — ABNORMAL LOW (ref 8.9–10.3)
Chloride: 107 mmol/L (ref 101–111)
Creatinine, Ser: 0.69 mg/dL (ref 0.61–1.24)
GFR calc Af Amer: >60 mL/min (ref >60)
GFR calc non Af Amer: >60 mL/min (ref >60)
Glucose, Bld: 173 mg/dL — ABNORMAL HIGH (ref 65–99)
Potassium: 4 mmol/L (ref 3.5–5.1)
Sodium: 135 mmol/L (ref 135–145)

## 2016-07-03 LAB — LIPASE, BLOOD: Lipase: 14 U/L (ref 11–51)

## 2016-07-03 LAB — ETHANOL: Alcohol, Ethyl (B): <5 mg/dL (ref <5)

## 2016-07-03 LAB — GLUCOSE, CAPILLARY
Glucose-Capillary: 531 mg/dL (ref 65–99)
Glucose-Capillary: 456 mg/dL — ABNORMAL HIGH (ref 65–99)
Glucose-Capillary: 292 mg/dL — ABNORMAL HIGH (ref 65–99)

## 2016-07-03 LAB — TROPONIN I
Troponin I: 0.04 ng/mL (ref <0.03)
Troponin I: <0.03 ng/mL (ref <0.03)

## 2016-07-03 MED ORDER — ACETAMINOPHEN 500 MG PO TABS
1000.0000 mg | ORAL_TABLET | Freq: Once | ORAL | Status: AC
  Administered 2016-07-03: 1000 mg via ORAL
  Filled 2016-07-03: qty 2

## 2016-07-03 MED ORDER — ONDANSETRON HCL 4 MG/2ML IJ SOLN
4.0000 mg | Freq: Once | INTRAMUSCULAR | Status: AC
  Administered 2016-07-03: 4 mg via INTRAVENOUS
  Filled 2016-07-03: qty 2

## 2016-07-03 MED ORDER — SODIUM CHLORIDE 0.9 % IV SOLN: INTRAVENOUS | Status: DC

## 2016-07-03 MED ORDER — INSULIN ASPART 100 UNIT/ML ~~LOC~~ SOLN
12.0000 [IU] | Freq: Once | SUBCUTANEOUS | Status: AC
  Administered 2016-07-03: 12 [IU] via SUBCUTANEOUS
  Filled 2016-07-03: qty 12

## 2016-07-03 MED ORDER — SODIUM CHLORIDE 0.9 % IV BOLUS (SEPSIS)
1000.0000 mL | Freq: Once | INTRAVENOUS | Status: AC
  Administered 2016-07-03: 1000 mL via INTRAVENOUS

## 2016-07-03 MED ORDER — SODIUM CHLORIDE 0.9 % IV SOLN
INTRAVENOUS | Status: DC
  Filled 2016-07-03: qty 2.5

## 2016-07-03 MED ORDER — SODIUM CHLORIDE 0.9 % IV BOLUS (SEPSIS)
500.0000 mL | Freq: Once | INTRAVENOUS | Status: AC
  Administered 2016-07-03: 500 mL via INTRAVENOUS

## 2016-07-03 NOTE — ED Triage Notes (Signed)
Pt to ED by EMS with c/o hyperglycemia. Pt is type 1 diabetic and states he ran out of his insulin yesterday. Upon EMS arrival pt's CBG was 495.

## 2016-07-03 NOTE — ED Provider Notes (Addendum)
[facility] Emergency Department Provider Note  ____________________________________________   I have reviewed the triage vital signs and the nursing notes.   HISTORY  Chief Complaint Hyperglycemia    HPI Trevor Brown is a 25 y.o. male with a history of IV drug abuse, noncompliance diabetes mellitus, who is here with some degree of frequency when he runs out of his insulin states he ran out of his insulin yesterday. Patient does have an arrangement where he can get free insulin across the street from this facility and he knows this. Nonetheless, he apparently did nothing to prevent himself or running out of insulin. He states he is no longer using IV drugs or alcohol. He states that he is not taking oral drugs either. In any event, patient states he ran out of his insulin yesterday is not exactly clear the last time he had his insulin was but he states he "ran out yesterday". In any event, as usual with him when he runs out of his insulin, he begins to vomit. He has some burning discomfort when he vomits. He denies any fever or chills. Denies any dysuria or urinary frequency or fever. Symptoms began last night apparently.     Past Medical History:  Diagnosis Date  . Diabetes mellitus without complication (HCC)   . Heart attack    Pt claims he had heart attack a year ago ( 2015)- and was admitted in [facility] for that, but not given any meds or angiogram, on review of chart- I could not find any details like that.  . Hepatitis C, acute may 2016  . Hypertension     Patient Active Problem List   Diagnosis Date Noted  . Acute gastroenteritis 04/11/2016  . Hypotension 04/11/2016  . Hyponatremia 04/11/2016  . Leukocytosis 04/11/2016  . Elevated transaminase level 03/27/2016  . Diffuse abdominal pain 03/27/2016  . Anemia 03/27/2016  . Diabetes mellitus type 1 (HCC) 03/27/2016  . Tobacco abuse counseling 03/27/2016  . Transaminitis 03/25/2016  . Tobacco abuse  01/10/2016  . Type 1 diabetes mellitus with hyperglycemia (HCC) 12/12/2015  . Cocaine abuse 12/12/2015  . DKA (diabetic ketoacidoses) (HCC) 06/10/2015  . Hidradenitis suppurativa of left axilla   . DKA, type 1 (HCC) 06/06/2015  . Malnutrition of moderate degree (HCC) 04/08/2015  . Hepatitis C 12/13/2014  . Diabetes type 1, uncontrolled (HCC) 12/11/2014  . Major depressive disorder, single episode, mild (HCC)   . Major depression, single episode 12/10/2014  . Diabetes mellitus type 1, uncontrolled (HCC) 12/09/2014  . Homelessness 12/09/2014    History reviewed. No pertinent surgical history.  Prior to Admission medications   Medication Sig Start Date End Date Taking? Authorizing Provider  insulin aspart (NOVOLOG) 100 UNIT/ML injection 0-9 Units, Subcutaneous, 3 times daily before meals & bedtime, First dose on Fri 03/15/16 at 2200 Correction coverage: Sensitive (thin, NPO, renal) CBG < 70: implement hypoglycemia protocol CBG 70 - 120: 0 units CBG 121 - 150: 1 unit CBG 151 - 200: 2 units CBG 201 - 250: 3 units CBG 251 - 300: 5 units CBG 301 - 350: 7 units CBG 351 - 400: 9 units 03/16/16   Wyatt Haste, MD  insulin aspart protamine- aspart (NOVOLOG MIX 70/30) (70-30) 100 UNIT/ML injection Inject 0.26 mLs (26 Units total) into the skin 2 (two) times daily. Patient taking differently: Inject 30 Units into the skin 2 (two) times daily.  04/11/16   Katharina Caper, MD  oxyCODONE (OXY IR/ROXICODONE) 5 MG immediate release tablet  Take 1 tablet (5 mg total) by mouth 4 (four) times daily as needed (moderate and severe pain). 03/27/16   Katharina Caperima Vaickute, MD    Allergies Bee venom; Penicillins; Ibuprofen; Tramadol; and Vancomycin  Family History  Problem Relation Age of Onset  . Cirrhosis Mother   . Diabetes Mellitus II Maternal Grandmother     Social History Social History  Substance Use Topics  . Smoking status: Current Every Day Smoker    Packs/day: 2.00    Types: Cigarettes  .  Smokeless tobacco: Never Used  . Alcohol use No    Review of Systems Constitutional: No fever/chills Eyes: No visual changes. ENT: No sore throat. No stiff neck no neck pain Cardiovascular: Denies chest pain. Respiratory: Denies shortness of breath. Gastrointestinal:   See history of present illness Genitourinary: Negative for dysuria. Musculoskeletal: Negative lower extremity swelling Skin: Negative for rash. Neurological: Negative for severe headaches, focal weakness or numbness. 10-point ROS otherwise negative.  ____________________________________________   PHYSICAL EXAM:  VITAL SIGNS: ED Triage Vitals  Enc Vitals Group     BP 07/03/16 0735 138/86     Pulse Rate 07/03/16 0735 82     Resp --      Temp 07/03/16 0735 98.3 F (36.8 C)     Temp Source 07/03/16 0735 Oral     SpO2 07/03/16 0731 100 %     Weight 07/03/16 0736 140 lb (63.5 kg)     Height 07/03/16 0736 5\' 4"  (1.626 m)     Head Circumference --      Peak Flow --      Pain Score 07/03/16 0736 10     Pain Loc --      Pain Edu? --      Excl. in GC? --     Constitutional: Alert and oriented. Well appearing and in no acute distress. Eyes: Conjunctivae are normal. PERRL. EOMI. Head: Atraumatic. Nose: No congestion/rhinnorhea. Mouth/Throat: Mucous membranes are Somewhat dry.  Oropharynx non-erythematous. Again that it is very poor dentition no evidence of abscess or Ludwig's angina Neck: No stridor.   Nontender with no meningismus Cardiovascular: Normal rate, regular rhythm. Grossly normal heart sounds.  Good peripheral circulation. Respiratory: Normal respiratory effort.  No retractions. Lungs CTAB. Abdominal: Soft and nontender. No distention. No guarding no rebound Back:  There is no focal tenderness or step off.  there is no midline tenderness there are no lesions noted. there is no CVA tenderness Musculoskeletal: No lower extremity tenderness, no upper extremity tenderness. No joint effusions, no DVT signs  strong distal pulses no edema Neurologic:  Normal speech and language. No gross focal neurologic deficits are appreciated.  Skin:  Skin is warm, dry and intact. No rash noted. Psychiatric: Mood and affect are normal. Speech and behavior are normal.  ____________________________________________   LABS (all labs ordered are listed, but only abnormal results are displayed)  Labs Reviewed  GLUCOSE, CAPILLARY - Abnormal; Notable for the following:       Result Value   Glucose-Capillary 531 (*)    All other components within normal limits  TROPONIN I  CBC WITH DIFFERENTIAL/PLATELET  URINALYSIS, COMPLETE (UACMP) WITH MICROSCOPIC  URINE DRUG SCREEN, QUALITATIVE ([facility] ONLY)  BLOOD GAS, VENOUS  COMPREHENSIVE METABOLIC PANEL  ETHANOL  LIPASE, BLOOD   ____________________________________________  EKG  I personally interpreted any EKGs ordered by me or triage *Sinus rhythm rate 79 beats per minutes of T waves in V1 and V2 consistent with partial right bundle branch block which is not  new. No acute ischemic changes ____________________________________________  RADIOLOGY  I reviewed any imaging ordered by me or triage that were performed during my shift and, if possible, patient and/or family made aware of any abnormal findings.   ____________________________________________   PROCEDURES  Procedure(s) performed: None  Procedures  Critical Care performed: CRITICAL CARE Performed by: Jeanmarie Plant   Total critical care time: 38 minutes  Critical care time was exclusive of separately billable procedures and treating other patients.  Critical care was necessary to treat or prevent imminent or life-threatening deterioration.  Critical care was time spent personally by me on the following activities: development of treatment plan with patient and/or surrogate as well as nursing, discussions with consultants, evaluation of patient's response to treatment, examination of patient,  obtaining history from patient or surrogate, ordering and performing treatments and interventions, ordering and review of laboratory studies, ordering and review of radiographic studies, pulse oximetry and re-evaluation of patient's condition.   ____________________________________________   INITIAL IMPRESSION / ASSESSMENT AND PLAN / ED COURSE  Pertinent labs & imaging results that were available during my care of the patient were reviewed by me and considered in my medical decision making (see chart for details). Patient is once again went out of his insulin and once again presents the emergency room. Possibly mild DKA although not toxic in appearance. We'll give him IV fluid, check a VBG and blood work, if him antiemetics as needed, evaluate for DKA. He is often mild DKA. Last time, we are able to close his Here. Hopefully we can do the same this time. Again if counseled the patient extensively about the need to follow-up for his poor dentition, as well as the need to be compliant with his medications and have some degree of forethought about what to do with his insulin is going to run out. Patient states he is no longer using narcotics however we'll check a urine drug screen.  ----------------------------------------- 9:47 AM on 07/03/2016 -----------------------------------------  Patient was a cocaine and opiates despite time he has not used in 2 months. Unfortunately he has an anion gap of nearly 20, we will start him on an insulin drip will have given him insulin bolus or giving him IV fluid he likely will need admission for DKA poor compliance and drug abuse  ----------------------------------------- 12:18 PM on 07/03/2016 -----------------------------------------  Hospitalist has requested that we recheck to see if his gap is closed as his sugar has come down. They will check this prior to admission.  ----------------------------------------- 2:41 PM on  07/03/2016 -----------------------------------------  Hospitalist would prefer to send the patient home as is anion gap has closed. Sugar is in the normal range for him. We have encouraged him not to use IV drugs or cocaine any more, he has adequate follow-up already.  ----------------------------------------- 2:45 PM on 07/03/2016 -----------------------------------------  Patient does not wish counseling for drug and alcohol abuse. No ongoing chest pain. Was requesting narcotic pain medication here which we will not give him. This was for peripheral neuropathy. He does have, according to social work, available to him across the street his insulin and he needs to go pick it up.  Clinical Course    ____________________________________________   FINAL CLINICAL IMPRESSION(S) / ED DIAGNOSES  Final diagnoses:  None      This chart was dictated using voice recognition software.  Despite best efforts to proofread,  errors can occur which can change meaning.      Jeanmarie Plant, MD 07/03/16 [phone]  Jeanmarie PlantJames A Tranell Wojtkiewicz, MD 07/03/16 [phone]    Jeanmarie PlantJames A Ziyan Schoon, MD 07/03/16 1048    Jeanmarie PlantJames A Darcell Sabino, MD 07/03/16 [phone]    Jeanmarie PlantJames A Teresia Myint, MD 07/03/16 [phone]    Jeanmarie PlantJames A Meir Elwood, MD 07/03/16 [phone]

## 2016-07-03 NOTE — ED Notes (Signed)
Repeat BMP drawn, lab notified to run it.

## 2016-07-03 NOTE — Discharge Instructions (Signed)
Go directly across the street and gets your  prescription filled for insulin. Do not use cocaine. Do not use  heroin. Return to the emergency room for new or worrisome symptoms.

## 2016-07-03 NOTE — ED Notes (Signed)
Pt provided turkey sandwich tray. 

## 2016-07-03 NOTE — Progress Notes (Signed)
Review of Glycemic Control  Diabetes history: DM1 Outpatient Diabetes medications: 70/30 26 units BID, Novolog 0-9 units tid and hs  Current orders for Inpatient glycemic control: Novolog 12 units once at 07:41am today  Inpatient Diabetes Program Recommendations:  NOTE: Noted consult for Inpatient Diabetes and chart reviewed. Patient is very well known to Inpatient Glycemic Control Team and has been seen by Diabetes Coordinator multiple times over the past few months. Unfortunately patient does not comply with dietary restrictions (despite strong advisement to do so) which further elevates glucose. Patient has been admitted 9 times over the past 6 months for DKA due to noncompliance with medications and diet. Diabetes Coordinator will continue to follow as an inpatient and make further recommendations as more data is collected.  NURSING: Please do not administer any insulin injections in patient's arms. Only use abdomen (rotating injection sites) for insulin administration while inpatient.  Will continue to follow.    Susette RacerJulie Johnny Gorter, RN, BA, MHA, CDE Diabetes Coordinator Inpatient Diabetes Program  [phone] (Team Pager) [phone] [facility] Office) 07/03/2016 8:46 AM

## 2016-07-04 ENCOUNTER — Encounter: Payer: Self-pay | Admitting: Emergency Medicine

## 2016-07-04 ENCOUNTER — Inpatient Hospital Stay
Admission: EM | Admit: 2016-07-04 | Discharge: 2016-07-05 | DRG: 638 | Disposition: A | Discharge status: Home / Self Care | Payer: Self-pay | Attending: Internal Medicine | Admitting: Internal Medicine

## 2016-07-04 DIAGNOSIS — K1379 Other lesions of oral mucosa: Secondary | ICD-10-CM

## 2016-07-04 DIAGNOSIS — F17210 Nicotine dependence, cigarettes, uncomplicated: Secondary | ICD-10-CM | POA: Diagnosis present

## 2016-07-04 DIAGNOSIS — I10 Essential (primary) hypertension: Secondary | ICD-10-CM | POA: Diagnosis present

## 2016-07-04 DIAGNOSIS — E1110 Type 2 diabetes mellitus with ketoacidosis without coma: Secondary | ICD-10-CM | POA: Diagnosis present

## 2016-07-04 DIAGNOSIS — R519 Headache, unspecified: Secondary | ICD-10-CM

## 2016-07-04 DIAGNOSIS — Z91030 Bee allergy status: Secondary | ICD-10-CM

## 2016-07-04 DIAGNOSIS — Z833 Family history of diabetes mellitus: Secondary | ICD-10-CM

## 2016-07-04 DIAGNOSIS — E870 Hyperosmolality and hypernatremia: Secondary | ICD-10-CM | POA: Diagnosis present

## 2016-07-04 DIAGNOSIS — Z881 Allergy status to other antibiotic agents status: Secondary | ICD-10-CM

## 2016-07-04 DIAGNOSIS — Z880 Allergy status to penicillin: Secondary | ICD-10-CM

## 2016-07-04 DIAGNOSIS — Z886 Allergy status to analgesic agent status: Secondary | ICD-10-CM

## 2016-07-04 DIAGNOSIS — Z794 Long term (current) use of insulin: Secondary | ICD-10-CM

## 2016-07-04 DIAGNOSIS — R51 Headache: Secondary | ICD-10-CM

## 2016-07-04 DIAGNOSIS — Z22322 Carrier or suspected carrier of Methicillin resistant Staphylococcus aureus: Secondary | ICD-10-CM

## 2016-07-04 DIAGNOSIS — E860 Dehydration: Secondary | ICD-10-CM | POA: Diagnosis present

## 2016-07-04 DIAGNOSIS — I252 Old myocardial infarction: Secondary | ICD-10-CM

## 2016-07-04 DIAGNOSIS — Z9114 Patient's other noncompliance with medication regimen: Secondary | ICD-10-CM

## 2016-07-04 DIAGNOSIS — E1010 Type 1 diabetes mellitus with ketoacidosis without coma: Principal | ICD-10-CM | POA: Diagnosis present

## 2016-07-04 LAB — COMPREHENSIVE METABOLIC PANEL
ALT: 19 U/L (ref 17–63)
AST: 22 U/L (ref 15–41)
Albumin: 4.3 g/dL (ref 3.5–5.0)
Alkaline Phosphatase: 186 U/L — ABNORMAL HIGH (ref 38–126)
Anion gap: 19 — ABNORMAL HIGH (ref 5–15)
BUN: 16 mg/dL (ref 6–20)
CO2: 12 mmol/L — ABNORMAL LOW (ref 22–32)
Calcium: 9.4 mg/dL (ref 8.9–10.3)
Chloride: 102 mmol/L (ref 101–111)
Creatinine, Ser: 1.09 mg/dL (ref 0.61–1.24)
GFR calc Af Amer: >60 mL/min (ref >60)
GFR calc non Af Amer: >60 mL/min (ref >60)
Glucose, Bld: 444 mg/dL — ABNORMAL HIGH (ref 65–99)
Potassium: 4.7 mmol/L (ref 3.5–5.1)
Sodium: 133 mmol/L — ABNORMAL LOW (ref 135–145)
Total Bilirubin: 1.2 mg/dL (ref 0.3–1.2)
Total Protein: 8 g/dL (ref 6.5–8.1)

## 2016-07-04 LAB — BETA-HYDROXYBUTYRIC ACID: Beta-Hydroxybutyric Acid: 8.02 mmol/L — ABNORMAL HIGH (ref 0.05–0.27)

## 2016-07-04 LAB — BASIC METABOLIC PANEL
Anion gap: 17 — ABNORMAL HIGH (ref 5–15)
Anion gap: 12 (ref 5–15)
BUN: 15 mg/dL (ref 6–20)
BUN: 14 mg/dL (ref 6–20)
CO2: 13 mmol/L — ABNORMAL LOW (ref 22–32)
CO2: 17 mmol/L — ABNORMAL LOW (ref 22–32)
Calcium: 8.8 mg/dL — ABNORMAL LOW (ref 8.9–10.3)
Calcium: 8.9 mg/dL (ref 8.9–10.3)
Chloride: 108 mmol/L (ref 101–111)
Chloride: 107 mmol/L (ref 101–111)
Creatinine, Ser: 1.01 mg/dL (ref 0.61–1.24)
Creatinine, Ser: 0.97 mg/dL (ref 0.61–1.24)
GFR calc Af Amer: >60 mL/min (ref >60)
GFR calc Af Amer: >60 mL/min (ref >60)
GFR calc non Af Amer: >60 mL/min (ref >60)
GFR calc non Af Amer: >60 mL/min (ref >60)
Glucose, Bld: 275 mg/dL — ABNORMAL HIGH (ref 65–99)
Glucose, Bld: 147 mg/dL — ABNORMAL HIGH (ref 65–99)
Potassium: 4.2 mmol/L (ref 3.5–5.1)
Potassium: 3.9 mmol/L (ref 3.5–5.1)
Sodium: 138 mmol/L (ref 135–145)
Sodium: 136 mmol/L (ref 135–145)

## 2016-07-04 LAB — URINALYSIS, COMPLETE (UACMP) WITH MICROSCOPIC
Bacteria, UA: NONE SEEN
Bilirubin Urine: NEGATIVE
Glucose, UA: >=500 mg/dL — AB
Hgb urine dipstick: NEGATIVE
Ketones, ur: 80 mg/dL — AB
Leukocytes, UA: NEGATIVE
Nitrite: NEGATIVE
Protein, ur: NEGATIVE mg/dL
RBC / HPF: NONE SEEN RBC/hpf (ref 0–5)
Specific Gravity, Urine: 1.027 (ref 1.005–1.030)
Squamous Epithelial / LPF: NONE SEEN
WBC, UA: NONE SEEN WBC/hpf (ref 0–5)
pH: 5 (ref 5.0–8.0)

## 2016-07-04 LAB — MRSA PCR SCREENING: MRSA by PCR: POSITIVE — AB

## 2016-07-04 LAB — GLUCOSE, CAPILLARY
Glucose-Capillary: 122 mg/dL — ABNORMAL HIGH (ref 65–99)
Glucose-Capillary: 134 mg/dL — ABNORMAL HIGH (ref 65–99)
Glucose-Capillary: 135 mg/dL — ABNORMAL HIGH (ref 65–99)
Glucose-Capillary: 137 mg/dL — ABNORMAL HIGH (ref 65–99)
Glucose-Capillary: 150 mg/dL — ABNORMAL HIGH (ref 65–99)
Glucose-Capillary: 161 mg/dL — ABNORMAL HIGH (ref 65–99)
Glucose-Capillary: 162 mg/dL — ABNORMAL HIGH (ref 65–99)
Glucose-Capillary: 169 mg/dL — ABNORMAL HIGH (ref 65–99)
Glucose-Capillary: 193 mg/dL — ABNORMAL HIGH (ref 65–99)
Glucose-Capillary: 210 mg/dL — ABNORMAL HIGH (ref 65–99)
Glucose-Capillary: 295 mg/dL — ABNORMAL HIGH (ref 65–99)
Glucose-Capillary: 315 mg/dL — ABNORMAL HIGH (ref 65–99)
Glucose-Capillary: 389 mg/dL — ABNORMAL HIGH (ref 65–99)

## 2016-07-04 LAB — CBC WITH DIFFERENTIAL/PLATELET
Basophils Absolute: 0 10*3/uL (ref 0–0.1)
Basophils Relative: 0 %
Eosinophils Absolute: 0 10*3/uL (ref 0–0.7)
Eosinophils Relative: 0 %
HCT: 42 % (ref 40.0–52.0)
Hemoglobin: 13.8 g/dL (ref 13.0–18.0)
Lymphocytes Relative: 18 %
Lymphs Abs: 1.7 10*3/uL (ref 1.0–3.6)
MCH: 30.1 pg (ref 26.0–34.0)
MCHC: 32.8 g/dL (ref 32.0–36.0)
MCV: 91.7 fL (ref 80.0–100.0)
Monocytes Absolute: 0.5 10*3/uL (ref 0.2–1.0)
Monocytes Relative: 5 %
Neutro Abs: 7.5 10*3/uL — ABNORMAL HIGH (ref 1.4–6.5)
Neutrophils Relative %: 77 %
Platelets: 234 10*3/uL (ref 150–440)
RBC: 4.58 MIL/uL (ref 4.40–5.90)
RDW: 13.2 % (ref 11.5–14.5)
WBC: 9.8 10*3/uL (ref 3.8–10.6)

## 2016-07-04 LAB — BLOOD GAS, VENOUS
Acid-base deficit: 8.5 mmol/L — ABNORMAL HIGH (ref 0.0–2.0)
Acid-base deficit: 14.2 mmol/L — ABNORMAL HIGH (ref 0.0–2.0)
Bicarbonate: 18.4 mmol/L — ABNORMAL LOW (ref 20.0–28.0)
Bicarbonate: 13 mmol/L — ABNORMAL LOW (ref 20.0–28.0)
Patient temperature: 37
Patient temperature: 37
pCO2, Ven: 42 mmHg — ABNORMAL LOW (ref 44.0–60.0)
pCO2, Ven: 34 mmHg — ABNORMAL LOW (ref 44.0–60.0)
pH, Ven: 7.25 (ref 7.250–7.430)
pH, Ven: 7.19 — CL (ref 7.250–7.430)
pO2, Ven: <31 mmHg — CL (ref 32.0–45.0)
pO2, Ven: <31 mmHg — CL (ref 32.0–45.0)

## 2016-07-04 MED ORDER — SODIUM CHLORIDE 0.9 % IV SOLN
Freq: Once | INTRAVENOUS | Status: AC
  Administered 2016-07-04: 1000 mL via INTRAVENOUS

## 2016-07-04 MED ORDER — ONDANSETRON HCL 4 MG/2ML IJ SOLN
INTRAMUSCULAR | Status: AC
  Administered 2016-07-04: 4 mg
  Filled 2016-07-04: qty 2

## 2016-07-04 MED ORDER — KCL IN DEXTROSE-NACL 40-5-0.45 MEQ/L-%-% IV SOLN
INTRAVENOUS | Status: DC
  Administered 2016-07-04: 14:00:00 via INTRAVENOUS
  Filled 2016-07-04 (×2): qty 1000

## 2016-07-04 MED ORDER — ENOXAPARIN SODIUM 40 MG/0.4ML ~~LOC~~ SOLN: 40.0000 mg | SUBCUTANEOUS | Status: DC

## 2016-07-04 MED ORDER — ONDANSETRON HCL 4 MG/2ML IJ SOLN
4.0000 mg | Freq: Four times a day (QID) | INTRAMUSCULAR | Status: DC | PRN
  Administered 2016-07-04 – 2016-07-05 (×3): 4 mg via INTRAVENOUS
  Filled 2016-07-04 (×3): qty 2

## 2016-07-04 MED ORDER — ENOXAPARIN SODIUM 40 MG/0.4ML ~~LOC~~ SOLN
40.0000 mg | SUBCUTANEOUS | Status: DC
  Administered 2016-07-04: 40 mg via SUBCUTANEOUS
  Filled 2016-07-04: qty 0.4

## 2016-07-04 MED ORDER — ONDANSETRON HCL 4 MG/2ML IJ SOLN
4.0000 mg | Freq: Once | INTRAMUSCULAR | Status: AC
  Administered 2016-07-04: 4 mg via INTRAVENOUS

## 2016-07-04 MED ORDER — ONDANSETRON HCL 4 MG/2ML IJ SOLN
INTRAMUSCULAR | Status: AC
  Administered 2016-07-04: 4 mg via INTRAVENOUS
  Filled 2016-07-04: qty 2

## 2016-07-04 MED ORDER — SODIUM CHLORIDE 0.9 % IV SOLN
INTRAVENOUS | Status: DC
  Filled 2016-07-04: qty 2.5

## 2016-07-04 MED ORDER — SODIUM CHLORIDE 0.9 % IV SOLN
Freq: Once | INTRAVENOUS | Status: AC
  Administered 2016-07-04: 3.3 [IU]/h via INTRAVENOUS
  Filled 2016-07-04: qty 2.5

## 2016-07-04 MED ORDER — SODIUM CHLORIDE 0.9 % IV SOLN
INTRAVENOUS | Status: DC
  Administered 2016-07-04 – 2016-07-05 (×2): via INTRAVENOUS

## 2016-07-04 MED ORDER — DEXTROSE-NACL 5-0.45 % IV SOLN
INTRAVENOUS | Status: DC
  Administered 2016-07-04: 14:00:00 via INTRAVENOUS

## 2016-07-04 MED ORDER — SODIUM CHLORIDE 0.9 % IV SOLN
INTRAVENOUS | Status: AC
  Administered 2016-07-04: 11:00:00 via INTRAVENOUS

## 2016-07-04 NOTE — Care Management Note (Signed)
Case Management Note  Patient Details  Name: Trevor Brown MRN: [id number] Date of Birth: [birth year]  Subjective/Objective:           [facility] why pt. Was unable to get his insulin yesterday and was told by Staff that the pt. Is still w/o a PCP after all the time they have served him. Dr Daryel NovemberJonathan Brown asked if he can be the PCP of record for his DM, and they have agreed. They will contact Dr Mayford KnifeWilliams for any insulin orders/ questions to keep the patient out of the ER. Awaiting dispo of the pt.         Action/Plan:   Expected Discharge Date:                  Expected Discharge Plan:     In-House Referral:     Discharge planning Services     Post Acute Care Choice:    Choice offered to:     DME Arranged:    DME Agency:     HH Arranged:    HH Agency:     Status of Service:     If discussed at [employer] Length of Stay Meetings, dates discussed:    Additional Comments:  Berna BueCheryl Airyana Sprunger, RN 07/04/2016, 10:01 AM

## 2016-07-04 NOTE — Progress Notes (Signed)
Followed up with lab regarding BMP labs- per lab order appeared to have been canceled on their side and was not due to be drawn until 2024 even though it was still active and due to be drawn q4hours. Lab technician states that they will come now and draw 2024 BMP early. Trevor Brown, receiving RN made aware- Lab at bedside drawing labs now.   Care Transferred.

## 2016-07-04 NOTE — ED Provider Notes (Signed)
[facility] Emergency Department Provider Note        Time seen: ----------------------------------------- 8:20 AM on 07/04/2016 -----------------------------------------    I have reviewed the triage vital signs and the nursing notes.   HISTORY  Chief Complaint No chief complaint on file.    HPI Trevor Brown is a 25 y.o. male who presents to the ER for possible DKA. Patient is well-known to the ER with frequent ER visits and chronic medication noncompliance.Patient states his blood sugar is elevated, states the leg and go home and his blood sugar was 300 yesterday. He denies recent illness.   Past Medical History:  Diagnosis Date  . Diabetes mellitus without complication (HCC)   . Heart attack    Pt claims he had heart attack a year ago ( 2015)- and [facility], but not given any meds or angiogram, on review of chart- I could not find any details like that.  . Hepatitis C, acute may 2016  . Hypertension     Patient Active Problem List   Diagnosis Date Noted  . Acute gastroenteritis 04/11/2016  . Hypotension 04/11/2016  . Hyponatremia 04/11/2016  . Leukocytosis 04/11/2016  . Elevated transaminase level 03/27/2016  . Diffuse abdominal pain 03/27/2016  . Anemia 03/27/2016  . Diabetes mellitus type 1 (HCC) 03/27/2016  . Tobacco abuse counseling 03/27/2016  . Transaminitis 03/25/2016  . Tobacco abuse 01/10/2016  . Type 1 diabetes mellitus with hyperglycemia (HCC) 12/12/2015  . Cocaine abuse 12/12/2015  . DKA (diabetic ketoacidoses) (HCC) 06/10/2015  . Hidradenitis suppurativa of left axilla   . DKA, type 1 (HCC) 06/06/2015  . Malnutrition of moderate degree (HCC) 04/08/2015  . Hepatitis C 12/13/2014  . Diabetes type 1, uncontrolled (HCC) 12/11/2014  . Major depressive disorder, single episode, mild (HCC)   . Major depression, single episode 12/10/2014  . Diabetes mellitus type 1, uncontrolled (HCC) 12/09/2014  .  Homelessness 12/09/2014    No past surgical history on file.  Allergies Bee venom; Penicillins; Ibuprofen; Tramadol; and Vancomycin  Social History Social History  Substance Use Topics  . Smoking status: Current Every Day Smoker    Packs/day: 2.00    Types: Cigarettes  . Smokeless tobacco: Never Used  . Alcohol use No    Review of Systems Constitutional: Negative for fever. Cardiovascular: Negative for chest pain. Respiratory: Negative for shortness of breath. Gastrointestinal: Negative for abdominal pain, vomiting and diarrhea. Genitourinary: Negative for dysuria. Musculoskeletal: Negative for back pain. Skin: Negative for rash. Neurological: Negative for headaches, positive for weakness  10-point ROS otherwise negative.  ____________________________________________   PHYSICAL EXAM:  VITAL SIGNS: ED Triage Vitals  Enc Vitals Group     BP      Pulse      Resp      Temp      Temp src      SpO2      Weight      Height      Head Circumference      Peak Flow      Pain Score      Pain Loc      Pain Edu?      Excl. in GC?     Constitutional: Alert,  No distress Eyes: Conjunctivae are normal. PERRL. Normal extraocular movements. ENT   Head: Normocephalic and atraumatic.   Nose: No congestion/rhinnorhea.   Mouth/Throat: Mucous membranes are dry   Neck: No stridor. Cardiovascular: Normal rate, regular rhythm. No murmurs, rubs, or  gallops. Respiratory: Normal respiratory effort without tachypnea nor retractions. Breath sounds are clear and equal bilaterally. No wheezes/rales/rhonchi. Gastrointestinal: Soft and nontender. Normal bowel sounds Musculoskeletal: Nontender with normal range of motion in all extremities. No lower extremity tenderness nor edema. Neurologic:  Normal speech and language. No gross focal neurologic deficits are appreciated.  Skin:  Skin is warm, dry and intact. No rash noted. Psychiatric: Mood and affect are normal. Speech and  behavior are normal.  ____________________________________________  ED COURSE:  Pertinent labs & imaging results that were available during my care of the patient were reviewed by me and considered in my medical decision making (see chart for details). Clinical Course   Patient presents to ER with hyperglycemia. We will assess with labs, give IV fluids and reevaluate.  Procedures ____________________________________________   LABS (pertinent positives/negatives)  Labs Reviewed  CBC WITH DIFFERENTIAL/PLATELET - Abnormal; Notable for the following:       Result Value   Neutro Abs 7.5 (*)    All other components within normal limits  COMPREHENSIVE METABOLIC PANEL - Abnormal; Notable for the following:    Sodium 133 (*)    CO2 12 (*)    Glucose, Bld 444 (*)    Alkaline Phosphatase 186 (*)    Anion gap 19 (*)    All other components within normal limits  URINALYSIS, COMPLETE (UACMP) WITH MICROSCOPIC - Abnormal; Notable for the following:    Color, Urine STRAW (*)    APPearance CLEAR (*)    Glucose, UA >=500 (*)    Ketones, ur 80 (*)    All other components within normal limits  BLOOD GAS, VENOUS - Abnormal; Notable for the following:    pH, Ven 7.19 (*)    pCO2, Ven 34 (*)    pO2, Ven <31.0 (*)    Bicarbonate 13.0 (*)    Acid-base deficit 14.2 (*)    All other components within normal limits  BETA-HYDROXYBUTYRIC ACID  CRITICAL CARE Performed by: Emily FilbertWilliams, Loreta Blouch E   Total critical care time: 30 minutes  Critical care time was exclusive of separately billable procedures and treating other patients.  Critical care was necessary to treat or prevent imminent or life-threatening deterioration.  Critical care was time spent personally by me on the following activities: development of treatment plan with patient and/or surrogate as well as nursing, discussions with consultants, evaluation of patient's response to treatment, examination of patient, obtaining history from  patient or surrogate, ordering and performing treatments and interventions, ordering and review of laboratory studies, ordering and review of radiographic studies, pulse oximetry and re-evaluation of patient's condition.  ____________________________________________  FINAL ASSESSMENT AND PLAN  Hyperglycemia, DKA   Plan: Patient with labs as dictated above. Patient states he did not have prescriptions to get his Insulin yesterday. He re-presents to the ED in DKA and has a long history of same. I have restarted an insulin drip. I will discuss with the hospitalist for admission.    Emily FilbertWilliams, Tyleah Loh E, MD   Note: This dictation was prepared with Dragon dictation. Any transcriptional errors that result from this process are unintentional    Emily FilbertJonathan E Tiya Schrupp, MD 07/04/16 [phone]

## 2016-07-04 NOTE — ED Notes (Signed)
Pt transported to ICU 8 via stretcher accompanied by RN and Medic. NAD.

## 2016-07-04 NOTE — ED Triage Notes (Signed)
Pt to ed from home with reports of n/v, pt was just seen here yesterday for same and an elevated blood sugar. Pt today pt with cbg 312. Pt was unable to fill his rx for insulin that was given yesterday at discharged. Pt c/o leg pain and nausea. Alert and oriented on arrival to ed with no acute distress noted. Vss.

## 2016-07-04 NOTE — H&P (Signed)
[facility] [place] [facility]   PATIENT NAME: Trevor Brown    MR#:  [id number]  DATE OF BIRTH:  [birth year]  DATE OF ADMISSION:  07/04/2016  PRIMARY CARE PHYSICIAN: No PCP Per Patient   REQUESTING/REFERRING PHYSICIAN: Williams  CHIEF COMPLAINT:   Chief Complaint  Patient presents with  . Nausea  . Emesis  . Hyperglycemia    HISTORY OF PRESENT ILLNESS: Trevor Knackony Ricketts  is a 25 y.o. male with a known history of Diabetes and noncompliance, hepatitis C, hypertension- recurrent ER visits and admissions for same reasons secondary to noncompliance to his insulin. Came yesterday to emergency room because of high blood sugar level and after receiving insulin his blood sugar was corrected within few hours so he was discharged home with prescriptions for insulin. He claims that he was never able to get the prescription filled up from his pharmacy because there was some problem and they tried calling ER to get more information but they could not get in touch with somebody. Survey sugar was high again today and he started vomiting so came to emergency room. Noted to be in DKA again. And given his admission.  PAST MEDICAL HISTORY:   Past Medical History:  Diagnosis Date  . Diabetes mellitus without complication (HCC)   . Heart attack    Pt claims he had heart attack a year ago ( 2015)- and was admitted in [facility], but not given any meds or angiogram, on review of chart- I could not find any details like that.  . Hepatitis C, acute may 2016  . Hypertension     PAST SURGICAL HISTORY: History reviewed. No pertinent surgical history.  SOCIAL HISTORY:  Social History  Substance Use Topics  . Smoking status: Current Every Day Smoker    Packs/day: 2.00    Types: Cigarettes  . Smokeless tobacco: Never Used  . Alcohol use No    FAMILY HISTORY:  Family History  Problem Relation Age of Onset  . Cirrhosis Mother   . Diabetes Mellitus II Maternal Grandmother     DRUG  ALLERGIES:  Allergies  Allergen Reactions  . Bee Venom Anaphylaxis  . Penicillins Anaphylaxis, Hives and Other (See Comments)    Has patient had a PCN reaction causing immediate rash, facial/tongue/throat swelling, SOB or lightheadedness with hypotension: Yes Has patient had a PCN reaction causing severe rash involving mucus membranes or skin necrosis: No Has patient had a PCN reaction that required hospitalization No Has patient had a PCN reaction occurring within the last 10 years: Yes If all of the above answers are "NO", then may proceed with Cephalosporin use.  . Ibuprofen Other (See Comments)    Reaction:  GI upset   . Tramadol Hives and Swelling  . Vancomycin Rash and Other (See Comments)    Reaction:  Red man's syndrome     REVIEW OF SYSTEMS:   CONSTITUTIONAL: No fever, fatigue or weakness.  EYES: No blurred or double vision.  EARS, NOSE, AND THROAT: No tinnitus or ear pain.  RESPIRATORY: No cough, shortness of breath, wheezing or hemoptysis.  CARDIOVASCULAR: No chest pain, orthopnea, edema.  GASTROINTESTINAL: Positive for nausea, vomiting, no diarrhea or abdominal pain.  GENITOURINARY: No dysuria, hematuria.  ENDOCRINE: No polyuria, nocturia,  HEMATOLOGY: No anemia, easy bruising or bleeding SKIN: No rash or lesion. MUSCULOSKELETAL: No joint pain or arthritis.   NEUROLOGIC: No tingling, numbness, weakness.  PSYCHIATRY: No anxiety or depression.   MEDICATIONS AT HOME:  Prior to Admission medications  Medication Sig Start Date End Date Taking? Authorizing Provider  insulin aspart protamine- aspart (NOVOLOG MIX 70/30) (70-30) 100 UNIT/ML injection Inject 0.26 mLs (26 Units total) into the skin 2 (two) times daily. Patient taking differently: Inject 30 Units into the skin 2 (two) times daily.  04/11/16  Yes Katharina Caperima Vaickute, MD  insulin aspart (NOVOLOG) 100 UNIT/ML injection 0-9 Units, Subcutaneous, 3 times daily before meals & bedtime, First dose on Fri 03/15/16 at  2200 Correction coverage: Sensitive (thin, NPO, renal) CBG < 70: implement hypoglycemia protocol CBG 70 - 120: 0 units CBG 121 - 150: 1 unit CBG 151 - 200: 2 units CBG 201 - 250: 3 units CBG 251 - 300: 5 units CBG 301 - 350: 7 units CBG 351 - 400: 9 units Patient not taking: Reported on 07/04/2016 03/16/16   Wyatt Hasteavid K Hower, MD      PHYSICAL EXAMINATION:   VITAL SIGNS: Blood pressure (!) 141/79, pulse 72, resp. rate (!) 29, height 5\' 4"  (1.626 m), weight 63.5 kg (140 lb), SpO2 100 %.  GENERAL:  25 y.o.-year-old patient lying in the bed with no acute distress.  EYES: Pupils equal, round, reactive to light and accommodation. No scleral icterus. Extraocular muscles intact.  HEENT: Head atraumatic, normocephalic. Oropharynx and nasopharynx clear. Oral mucosa looks dry. NECK:  Supple, no jugular venous distention. No thyroid enlargement, no tenderness.  LUNGS: Normal breath sounds bilaterally, no wheezing, rales,rhonchi or crepitation. No use of accessory muscles of respiration.  CARDIOVASCULAR: S1, S2 normal. No murmurs, rubs, or gallops.  ABDOMEN: Soft, nontender, nondistended. Bowel sounds present. No organomegaly or mass.  EXTREMITIES: No pedal edema, cyanosis, or clubbing.  NEUROLOGIC: Cranial nerves II through XII are intact. Muscle strength 5/5 in all extremities. Sensation intact. Gait not checked.  PSYCHIATRIC: The patient is alert and oriented x 3.  SKIN: No obvious rash, lesion, or ulcer.   LABORATORY PANEL:   CBC  Recent Labs Lab 07/03/16 0812 07/04/16 0818  WBC 8.4 9.8  HGB 14.7 13.8  HCT 43.5 42.0  PLT 291 234  MCV 88.8 91.7  MCH 30.0 30.1  MCHC 33.8 32.8  RDW 12.9 13.2  LYMPHSABS 1.4 1.7  MONOABS 0.3 0.5  EOSABS 0.0 0.0  BASOSABS 0.0 0.0   ------------------------------------------------------------------------------------------------------------------  Chemistries   Recent Labs Lab 07/03/16 0812 07/03/16 1258 07/04/16 0818  NA 129* 135 133*  K 5.1  4.0 4.7  CL 93* 107 102  CO2 17* 18* 12*  GLUCOSE 559* 173* 444*  BUN 19 17 16   CREATININE 1.08 0.69 1.09  CALCIUM 9.5 8.6* 9.4  AST 24  --  22  ALT 19  --  19  ALKPHOS 200*  --  186*  BILITOT 1.4*  --  1.2   ------------------------------------------------------------------------------------------------------------------ estimated creatinine clearance is 88.3 mL/min (by C-G formula based on SCr of 1.09 mg/dL). ------------------------------------------------------------------------------------------------------------------ No results for input(s): TSH, T4TOTAL, T3FREE, THYROIDAB in the last 72 hours.  Invalid input(s): FREET3   Coagulation profile No results for input(s): INR, PROTIME in the last 168 hours. ------------------------------------------------------------------------------------------------------------------- No results for input(s): DDIMER in the last 72 hours. -------------------------------------------------------------------------------------------------------------------  Cardiac Enzymes  Recent Labs Lab 07/03/16 0812 07/03/16 1258  TROPONINI 0.04* <0.03   ------------------------------------------------------------------------------------------------------------------ Invalid input(s): POCBNP  ---------------------------------------------------------------------------------------------------------------  Urinalysis    Component Value Date/Time   COLORURINE STRAW (A) 07/04/2016 0818   APPEARANCEUR CLEAR (A) 07/04/2016 0818   APPEARANCEUR Clear 04/06/2014 0751   LABSPEC 1.027 07/04/2016 0818   LABSPEC 1.026 04/06/2014 0751   PHURINE 5.0 07/04/2016  0818   GLUCOSEU >=500 (A) 07/04/2016 0818   GLUCOSEU >=500 04/06/2014 0751   HGBUR NEGATIVE 07/04/2016 0818   BILIRUBINUR NEGATIVE 07/04/2016 0818   BILIRUBINUR Negative 04/06/2014 0751   KETONESUR 80 (A) 07/04/2016 0818   PROTEINUR NEGATIVE 07/04/2016 0818   NITRITE NEGATIVE 07/04/2016 0818    LEUKOCYTESUR NEGATIVE 07/04/2016 0818   LEUKOCYTESUR Negative 04/06/2014 0751     RADIOLOGY: No results found.  EKG: Orders placed or performed during the hospital encounter of 07/03/16  . EKG 12-Lead  . EKG 12-Lead    IMPRESSION AND PLAN: * Diabetes ketoacidosis   Noncompliance to his initiating and recurrent visits to emergency room and admissions.    IV insulin drip, IV fluids, maintain nothing by mouth and frequent check of his BMP and blood sugar level for correction of his DKA.   ER physician has gladly agreed to be as his primary care physician on paper so he can continue getting his prescriptions filled up at [facility] pharmacy where they were not ready to take him without any physician's name on his case.   * Drug abuse   Patient's urine is positive for cocaine and opiates Yesterday.   He has tendency to ask for pain medicines in hospital, but we would strictly not give any pain medicines here.  * Active smoking   Counseled to quit smoking for 4 minutes, offered nicotine patch.   All the records are reviewed and case discussed with ED provider. Management plans discussed with the patient, family and they are in agreement.  CODE STATUS: Code Status History    Date Active Date Inactive Code Status Order ID Comments User Context   04/09/2016  4:54 PM 04/11/2016  2:09 PM Full Code [id number]  Katharina Caper, MD Inpatient   03/26/2016  1:18 AM 03/27/2016  7:11 PM Full Code [id number]  Tonye Royalty, DO Inpatient   03/18/2016  8:15 AM 03/21/2016  8:35 PM Full Code [id number]  Wyatt Haste, MD ED   03/12/2016 12:20 PM 03/16/2016  7:27 PM Full Code [id number]  Enedina Finner, MD Inpatient   02/18/2016  4:59 PM 02/19/2016  5:18 PM Full Code [id number]  Ramonita Lab, MD ED   01/12/2016  8:58 AM 01/15/2016  7:33 PM Full Code [id number]  Milagros Loll, MD ED   01/11/2016 12:35 AM 01/11/2016 11:20 PM Full Code [id number]  Gery Pray, MD Inpatient   12/12/2015 10:04 PM 12/14/2015  6:38 PM Full  Code [id number]  Katharina Caper, MD Inpatient   10/23/2015  5:42 PM 10/25/2015  7:04 PM Full Code [id number]  Katha Hamming, MD ED   06/10/2015  8:20 AM 06/12/2015  2:41 PM Full Code [id number]  Arnaldo Natal, MD Inpatient   06/06/2015  8:43 PM 06/07/2015  4:20 PM Full Code [id number]  Enedina Finner, MD Inpatient   04/07/2015 12:45 PM 04/08/2015  8:37 PM Full Code [id number]  Altamese Dilling, MD Inpatient   12/09/2014  2:44 PM 12/13/2014  4:16 PM Full Code [id number]  Gale Journey, MD Inpatient       TOTAL TIME TAKING CARE OF THIS PATIENT: 50 critical care minutes.    Altamese Dilling M.D on 07/04/2016   Between 7am to 6pm - Pager - [phone]  After 6pm go to [email or web address] - Social research officer, government  [facility]  Office  [phone]  CC: Primary care physician; No PCP Per Patient   Note: This dictation was prepared with Dragon dictation along with smaller phrase technology. Any transcriptional errors that result  from this process are unintentional.

## 2016-07-04 NOTE — Clinical Social Work Note (Signed)
CSW consulted due to readmissions and access to medication. RN CM is familiar with patient and will address any medication concerns. Please re consult CSW if needed. York SpanielMonica Lesley Galentine MSW,LCSW [phone]

## 2016-07-05 DIAGNOSIS — R519 Headache, unspecified: Secondary | ICD-10-CM

## 2016-07-05 DIAGNOSIS — E860 Dehydration: Secondary | ICD-10-CM

## 2016-07-05 DIAGNOSIS — R51 Headache: Secondary | ICD-10-CM

## 2016-07-05 DIAGNOSIS — Z22322 Carrier or suspected carrier of Methicillin resistant Staphylococcus aureus: Secondary | ICD-10-CM

## 2016-07-05 DIAGNOSIS — K1379 Other lesions of oral mucosa: Secondary | ICD-10-CM

## 2016-07-05 LAB — BASIC METABOLIC PANEL
Anion gap: 8 (ref 5–15)
Anion gap: 9 (ref 5–15)
BUN: 15 mg/dL (ref 6–20)
BUN: 18 mg/dL (ref 6–20)
CO2: 20 mmol/L — ABNORMAL LOW (ref 22–32)
CO2: 19 mmol/L — ABNORMAL LOW (ref 22–32)
Calcium: 9 mg/dL (ref 8.9–10.3)
Calcium: 8.6 mg/dL — ABNORMAL LOW (ref 8.9–10.3)
Chloride: 109 mmol/L (ref 101–111)
Chloride: 106 mmol/L (ref 101–111)
Creatinine, Ser: 0.78 mg/dL (ref 0.61–1.24)
Creatinine, Ser: 0.79 mg/dL (ref 0.61–1.24)
GFR calc Af Amer: >60 mL/min (ref >60)
GFR calc Af Amer: >60 mL/min (ref >60)
GFR calc non Af Amer: >60 mL/min (ref >60)
GFR calc non Af Amer: >60 mL/min (ref >60)
Glucose, Bld: 136 mg/dL — ABNORMAL HIGH (ref 65–99)
Glucose, Bld: 303 mg/dL — ABNORMAL HIGH (ref 65–99)
Potassium: 3.7 mmol/L (ref 3.5–5.1)
Potassium: 4.2 mmol/L (ref 3.5–5.1)
Sodium: 137 mmol/L (ref 135–145)
Sodium: 134 mmol/L — ABNORMAL LOW (ref 135–145)

## 2016-07-05 LAB — CBC
HCT: 37.8 % — ABNORMAL LOW (ref 40.0–52.0)
Hemoglobin: 12.8 g/dL — ABNORMAL LOW (ref 13.0–18.0)
MCH: 29.8 pg (ref 26.0–34.0)
MCHC: 33.9 g/dL (ref 32.0–36.0)
MCV: 87.8 fL (ref 80.0–100.0)
Platelets: 252 10*3/uL (ref 150–440)
RBC: 4.31 MIL/uL — ABNORMAL LOW (ref 4.40–5.90)
RDW: 12.6 % (ref 11.5–14.5)
WBC: 6.9 10*3/uL (ref 3.8–10.6)

## 2016-07-05 LAB — GLUCOSE, CAPILLARY
Glucose-Capillary: 141 mg/dL — ABNORMAL HIGH (ref 65–99)
Glucose-Capillary: 147 mg/dL — ABNORMAL HIGH (ref 65–99)
Glucose-Capillary: 156 mg/dL — ABNORMAL HIGH (ref 65–99)
Glucose-Capillary: 141 mg/dL — ABNORMAL HIGH (ref 65–99)
Glucose-Capillary: 403 mg/dL — ABNORMAL HIGH (ref 65–99)
Glucose-Capillary: 339 mg/dL — ABNORMAL HIGH (ref 65–99)

## 2016-07-05 MED ORDER — IBUPROFEN 100 MG/5ML PO SUSP
200.0000 mg | Freq: Three times a day (TID) | ORAL | Status: DC | PRN
  Administered 2016-07-05: 200 mg via ORAL
  Filled 2016-07-05 (×4): qty 10

## 2016-07-05 MED ORDER — MAGIC MOUTHWASH: 10.0000 mL | Freq: Four times a day (QID) | ORAL | 4 refills | Status: DC

## 2016-07-05 MED ORDER — MUPIROCIN 2 % EX OINT: 1.0000 "application " | TOPICAL_OINTMENT | Freq: Two times a day (BID) | CUTANEOUS | 0 refills | Status: DC

## 2016-07-05 MED ORDER — INSULIN ASPART 100 UNIT/ML ~~LOC~~ SOLN
6.0000 [IU] | Freq: Once | SUBCUTANEOUS | Status: AC
  Administered 2016-07-05: 6 [IU] via SUBCUTANEOUS
  Filled 2016-07-05: qty 6

## 2016-07-05 MED ORDER — MAGIC MOUTHWASH
10.0000 mL | Freq: Four times a day (QID) | ORAL | Status: DC
  Administered 2016-07-05: 10 mL via ORAL
  Filled 2016-07-05: qty 10

## 2016-07-05 MED ORDER — INSULIN ASPART 100 UNIT/ML ~~LOC~~ SOLN
0.0000 [IU] | SUBCUTANEOUS | Status: DC
  Administered 2016-07-05: 1 [IU] via SUBCUTANEOUS
  Filled 2016-07-05: qty 1

## 2016-07-05 MED ORDER — INSULIN ASPART 100 UNIT/ML ~~LOC~~ SOLN
0.0000 [IU] | Freq: Three times a day (TID) | SUBCUTANEOUS | Status: DC
  Administered 2016-07-05: 11 [IU] via SUBCUTANEOUS
  Filled 2016-07-05: qty 11

## 2016-07-05 MED ORDER — CHLORHEXIDINE GLUCONATE CLOTH 2 % EX PADS: 6.0000 | MEDICATED_PAD | Freq: Every day | CUTANEOUS | Status: DC

## 2016-07-05 MED ORDER — INSULIN ASPART 100 UNIT/ML ~~LOC~~ SOLN: 0.0000 [IU] | Freq: Every day | SUBCUTANEOUS | Status: DC

## 2016-07-05 MED ORDER — INSULIN ASPART PROT & ASPART (70-30 MIX) 100 UNIT/ML ~~LOC~~ SUSP: 36.0000 [IU] | Freq: Two times a day (BID) | SUBCUTANEOUS | Status: DC

## 2016-07-05 MED ORDER — CHLORHEXIDINE GLUCONATE CLOTH 2 % EX PADS: 6.0000 | MEDICATED_PAD | Freq: Every day | CUTANEOUS | 0 refills | Status: DC

## 2016-07-05 MED ORDER — ACETAMINOPHEN 325 MG PO TABS
650.0000 mg | ORAL_TABLET | Freq: Four times a day (QID) | ORAL | Status: DC | PRN
Start: 2016-07-05 — End: 2016-07-05
  Administered 2016-07-05: 650 mg via ORAL
  Filled 2016-07-05: qty 2

## 2016-07-05 MED ORDER — MUPIROCIN 2 % EX OINT
1.0000 "application " | TOPICAL_OINTMENT | Freq: Two times a day (BID) | CUTANEOUS | Status: DC
  Filled 2016-07-05: qty 22

## 2016-07-05 MED ORDER — INSULIN ASPART 100 UNIT/ML ~~LOC~~ SOLN: 0.0000 [IU] | Freq: Three times a day (TID) | SUBCUTANEOUS | Status: DC

## 2016-07-05 MED ORDER — INSULIN GLARGINE 100 UNIT/ML ~~LOC~~ SOLN
36.0000 [IU] | Freq: Every day | SUBCUTANEOUS | Status: DC
  Administered 2016-07-05: 36 [IU] via SUBCUTANEOUS
  Filled 2016-07-05 (×2): qty 0.36

## 2016-07-05 NOTE — Progress Notes (Signed)
Inpatient Diabetes Program Recommendations  AACE/ADA: New Consensus Statement on Inpatient Glycemic Control (2015)  Target Ranges:  Prepandial:   less than 140 mg/dL      Peak postprandial:   less than 180 mg/dL (1-2 hours)      Critically ill patients:  140 - 180 mg/dL   Results for Trevor DarlingBAILEY, [name] (MRN [id number]) as of 07/05/2016 08:08  Ref. Range 07/04/2016 23:59 07/05/2016 01:04 07/05/2016 02:09 07/05/2016 03:25 07/05/2016 04:28 07/05/2016 07:30  Glucose-Capillary Latest Ref Range: 65 - 99 mg/dL 409134 (H) 811141 (H) 914147 (H) 156 (H) 141 (H) 403 (H)   Review of Glycemic Control  Diabetes history: DM1 Outpatient Diabetes medications: 70/30 30 units BID, Novolog 0-9 units ACHS Current orders for Inpatient glycemic control: Lantus 36 units QHS, Novolog 0-15 units TID with meals  Inpatient Diabetes Program Recommendations: Insulin - Basal: Please consider discontinuing Lantus and ordering 70/30 36 units BID (starting with supper today). Correction (SSI): Please add Novolog bedtime correction scale. Insulin - Meal Coverage: Please consider ordering Novolog 6 units with breakfast and lunch today for meal coverage. If 70/30 is ordered as recommended starting with supper today, patient will not need additional Novolog insulin for meal coverage.  NURSING: Please do not administer any insulin injections in patient's arms. Only use abdomen (rotating injection sites) for insulin administration while inpatient.  Thanks, Orlando PennerMarie Earline Stiner, RN, MSN, CDE Diabetes Coordinator Inpatient Diabetes Program [phone] (Team Pager from 8am to 5pm)

## 2016-07-05 NOTE — Progress Notes (Signed)
Discharge patient per MD order. PIV x 2 removed, pressure bandage applied. Discharge instructions, Rx and papers for medication management given to patient. Patient stated he would go home first to have father help him fill out paperwork and then go to [facility] tomorrow (Saturday before noon). Transported patient in wheelchair to visitor's entrance.

## 2016-07-05 NOTE — Discharge Summary (Addendum)
[facility] - [place] at [facility]   PATIENT NAME: Trevor Brown    MR#:  [id number]  DATE OF BIRTH:  [birth year]  DATE OF ADMISSION:  07/04/2016 ADMITTING PHYSICIAN: Altamese Dilling, MD  DATE OF DISCHARGE: No discharge date for patient encounter.  PRIMARY CARE PHYSICIAN: No PCP Per Patient     ADMISSION DIAGNOSIS:  Diabetic ketoacidosis without coma associated with type 1 diabetes mellitus (HCC) [E10.10]  DISCHARGE DIAGNOSIS:  Principal Problem:   DKA (diabetic ketoacidoses) (HCC) Active Problems:   Dehydration   Headache   Mouth sores   MRSA carrier   SECONDARY DIAGNOSIS:   Past Medical History:  Diagnosis Date  . Diabetes mellitus without complication (HCC)   . Heart attack    Pt claims he had heart attack a year ago ( 2015)- and was admitted in [facility] for that, but not given any meds or angiogram, on review of chart- I could not find any details like that.  . Hepatitis C, acute may 2016  . Hypertension     .pro HOSPITAL COURSE:   The patient is 25 year old Caucasian male with medical history significant for history of diabetes mellitus type 1, hypertension, medical noncompliance, who has been admitted to the hospital numerous times for DKA episodes, who comes to the hospital with complaints of high blood glucose levels. Apparently patient was in the emergency room with elevated blood glucose levels day before this admission, he was given prescription for insulin, however, never filled it. He had nausea and vomiting, so he decided to come back to emergency room. He was noted to be in DKA again and was admitted for IV hydration and insulin intravenously. He has acidosis was corrected, he was reinitiated on insulin 7030. He was felt to be stable to be discharged home Discussion by problem: * Diabetes ketoacidosis, resolved on IV fluids and insulin intravenously, now back on insulin 7030, he is to resume these medications and continue to follow  up with his primary care physician I stressed the importance of close follow-up and a close strict diabetes control. Management is consulted to help him with transition to home, including helping to obtain his medications  * Drug abuse   Patient's urine was positive for cocaine and opiates aday before admission, he was counseled about drug abuse.     * Active smoking   Counseled to quit smoking for 4 minutes, offered nicotine patch.  *Hypernatremia, likely dehydration, hyperglycemia related, patient is receiving IV fluids, reassess sodium level as outpatient  *Medical noncompliance. Patient has been seen by a care management and social worker as numerous time here in the hospital with all possible help, unfortunately, he continues to intentionally or unintentionally hurt himself by not taking diabetic medications, he is counseled today again and care management is consulted to help with medications  *Headache, patient was advised to take Tylenol and Advil if needed for headaches, no pain medications are prescribed  MRSA carrier, patient is to continue Bactroban intranasally, chlorhexidine wipes DISCHARGE CONDITIONS:   Stable  CONSULTS OBTAINED:    DRUG ALLERGIES:   Allergies  Allergen Reactions  . Bee Venom Anaphylaxis  . Penicillins Anaphylaxis, Hives and Other (See Comments)    Has patient had a PCN reaction causing immediate rash, facial/tongue/throat swelling, SOB or lightheadedness with hypotension: Yes Has patient had a PCN reaction causing severe rash involving mucus membranes or skin necrosis: No Has patient had a PCN reaction that required hospitalization No Has patient had a PCN reaction occurring within  the last 10 years: Yes If all of the above answers are "NO", then may proceed with Cephalosporin use.  . Ibuprofen Other (See Comments)    Reaction:  GI upset   . Tramadol Hives and Swelling  . Vancomycin Rash and Other (See Comments)    Reaction:  Red man's syndrome      DISCHARGE MEDICATIONS:   Current Discharge Medication List    START taking these medications   Details  Chlorhexidine Gluconate Cloth 2 % PADS Apply 6 each topically daily at 6 (six) AM. Qty: 6 each, Refills: 0    magic mouthwash SOLN Take 10 mLs by mouth 4 (four) times daily. Qty: 200 mL, Refills: 4    mupirocin ointment (BACTROBAN) 2 % Place 1 application into the nose 2 (two) times daily. Qty: 22 g, Refills: 0      CONTINUE these medications which have NOT CHANGED   Details  insulin aspart protamine- aspart (NOVOLOG MIX 70/30) (70-30) 100 UNIT/ML injection Inject 0.26 mLs (26 Units total) into the skin 2 (two) times daily. Qty: 10 mL, Refills: 11    insulin aspart (NOVOLOG) 100 UNIT/ML injection 0-9 Units, Subcutaneous, 3 times daily before meals & bedtime, First dose on Fri 03/15/16 at 2200 Correction coverage: Sensitive (thin, NPO, renal) CBG < 70: implement hypoglycemia protocol CBG 70 - 120: 0 units CBG 121 - 150: 1 unit CBG 151 - 200: 2 units CBG 201 - 250: 3 units CBG 251 - 300: 5 units CBG 301 - 350: 7 units CBG 351 - 400: 9 units Qty: 10 mL, Refills: 11         DISCHARGE INSTRUCTIONS:    Patient is to follow-up with primary care physician within one week after discharge  If you experience worsening of your admission symptoms, develop shortness of breath, life threatening emergency, suicidal or homicidal thoughts you must seek medical attention immediately by calling 911 or calling your MD immediately  if symptoms less severe.  You Must read complete instructions/literature along with all the possible adverse reactions/side effects for all the Medicines you take and that have been prescribed to you. Take any new Medicines after you have completely understood and accept all the possible adverse reactions/side effects.   Please note  You were cared for by a hospitalist during your hospital stay. If you have any questions about your discharge medications or  the care you received while you were in the hospital after you are discharged, you can call the unit and asked to speak with the hospitalist on call if the hospitalist that took care of you is not available. Once you are discharged, your primary care physician will handle any further medical issues. Please note that NO REFILLS for any discharge medications will be authorized once you are discharged, as it is imperative that you return to your primary care physician (or establish a relationship with a primary care physician if you do not have one) for your aftercare needs so that they can reassess your need for medications and monitor your lab values.    Today   CHIEF COMPLAINT:   Chief Complaint  Patient presents with  . Nausea  . Emesis  . Hyperglycemia    HISTORY OF PRESENT ILLNESS:  Trevor Brown  is a 25 y.o. male with a known history of diabetes mellitus type 1, hypertension, medical noncompliance, who has been admitted to the hospital numerous times for DKA episodes, who comes to the hospital with complaints of high blood glucose levels. Apparently  patient was in the emergency room with elevated blood glucose levels day before this admission, he was given prescription for insulin, however, never filled it. He had nausea and vomiting, so he decided to come back to emergency room. He was noted to be in DKA again and was admitted for IV hydration and insulin intravenously. He has acidosis was corrected, he was reinitiated on insulin 7030. He was felt to be stable to be discharged home Discussion by problem: * Diabetes ketoacidosis, resolved on IV fluids and insulin intravenously, now back on insulin 7030, he is to resume these medications and continue to follow up with his primary care physician I stressed the importance of close follow-up and a close strict diabetes control. Management is consulted to help him with transition to home, including helping to obtain his medications  * Drug abuse    Patient's urine was positive for cocaine and opiates aday before admission, he was counseled about drug abuse.     * Active smoking   Counseled to quit smoking for 4 minutes, offered nicotine patch.  *Hypernatremia, likely dehydration, hyperglycemia related, patient is receiving IV fluids, reassess sodium level as outpatient  *Medical noncompliance. Patient has been seen by a care management and social worker as numerous time here in the hospital with all possible help, unfortunately, he continues to intentionally or unintentionally hurt himself by not taking diabetic medications, he is counseled today again and care management is consulted to help with medications  *Headache, patient was advised to take Tylenol and Advil if needed for headaches, no pain medications are prescribed  MRSA carrier, patient is to continue Bactroban intranasally, chlorhexidine wipes  VITAL SIGNS:  Blood pressure 139/82, pulse 67, temperature 98.6 F (37 C), temperature source Axillary, resp. rate (!) 22, height 5\' 4"  (1.626 m), weight 56.2 kg (123 lb 14.4 oz), SpO2 98 %.  I/O:    Intake/Output Summary (Last 24 hours) at 07/05/16 1522 Last data filed at 07/05/16 1400  Gross per 24 hour  Intake          1016.13 ml  Output             2000 ml  Net          -983.87 ml    PHYSICAL EXAMINATION:  GENERAL:  25 y.o.-year-old patient lying in the bed with no acute distress.  EYES: Pupils equal, round, reactive to light and accommodation. No scleral icterus. Extraocular muscles intact.  HEENT: Head atraumatic, normocephalic. Oropharynx and nasopharynx clear.  NECK:  Supple, no jugular venous distention. No thyroid enlargement, no tenderness.  LUNGS: Normal breath sounds bilaterally, no wheezing, rales,rhonchi or crepitation. No use of accessory muscles of respiration.  CARDIOVASCULAR: S1, S2 normal. No murmurs, rubs, or gallops.  ABDOMEN: Soft, non-tender, non-distended. Bowel sounds present. No organomegaly or  mass.  EXTREMITIES: No pedal edema, cyanosis, or clubbing.  NEUROLOGIC: Cranial nerves II through XII are intact. Muscle strength 5/5 in all extremities. Sensation intact. Gait not checked.  PSYCHIATRIC: The patient is alert and oriented x 3.  SKIN: No obvious rash, lesion, or ulcer.   DATA REVIEW:   CBC  Recent Labs Lab 07/05/16 0546  WBC 6.9  HGB 12.8*  HCT 37.8*  PLT 252    Chemistries   Recent Labs Lab 07/04/16 0818  07/05/16 0546  NA 133*  < > 134*  K 4.7  < > 4.2  CL 102  < > 106  CO2 12*  < > 19*  GLUCOSE 444*  < >  303*  BUN 16  < > 18  CREATININE 1.09  < > 0.79  CALCIUM 9.4  < > 8.6*  AST 22  --   --   ALT 19  --   --   ALKPHOS 186*  --   --   BILITOT 1.2  --   --   < > = values in this interval not displayed.  Cardiac Enzymes  Recent Labs Lab 07/03/16 1258  TROPONINI <0.03    Microbiology Results  Results for orders placed or performed during the hospital encounter of 07/04/16  MRSA PCR Screening     Status: Abnormal   Collection Time: 07/04/16 12:38 PM  Result Value Ref Range Status   MRSA by PCR POSITIVE (A) NEGATIVE Final    Comment:        The GeneXpert MRSA Assay (FDA approved for NASAL specimens only), is one component of a comprehensive MRSA colonization surveillance program. It is not intended to diagnose MRSA infection nor to guide or monitor treatment for MRSA infections. RESULT CALLED TO, READ BACK BY AND VERIFIED WITH:  STACEY COLLIE AT 1420 07/04/16 SDR     RADIOLOGY:  No results found.  EKG:   Orders placed or performed during the hospital encounter of 07/03/16  . EKG 12-Lead  . EKG 12-Lead      Management plans discussed with the patient, family and they are in agreement.  CODE STATUS:     Code Status Orders        Start     Ordered   07/04/16 1224  Full code  Continuous     07/04/16 1224    Code Status History    Date Active Date Inactive Code Status Order ID Comments User Context   07/04/2016  12:24 PM  Full Code [id number]  Altamese Dilling, MD Inpatient   04/09/2016  4:54 PM 04/11/2016  2:09 PM Full Code [id number]  Katharina Caper, MD Inpatient   03/26/2016  1:18 AM 03/27/2016  7:11 PM Full Code [id number]  Tonye Royalty, DO Inpatient   03/18/2016  8:15 AM 03/21/2016  8:35 PM Full Code [id number]  Wyatt Haste, MD ED   03/12/2016 12:20 PM 03/16/2016  7:27 PM Full Code [id number]  Enedina Finner, MD Inpatient   02/18/2016  4:59 PM 02/19/2016  5:18 PM Full Code [id number]  Ramonita Lab, MD ED   01/12/2016  8:58 AM 01/15/2016  7:33 PM Full Code [id number]  Milagros Loll, MD ED   01/11/2016 12:35 AM 01/11/2016 11:20 PM Full Code [id number]  Gery Pray, MD Inpatient   12/12/2015 10:04 PM 12/14/2015  6:38 PM Full Code [id number]  Katharina Caper, MD Inpatient   10/23/2015  5:42 PM 10/25/2015  7:04 PM Full Code [id number]  Katha Hamming, MD ED   06/10/2015  8:20 AM 06/12/2015  2:41 PM Full Code [id number]  Arnaldo Natal, MD Inpatient   06/06/2015  8:43 PM 06/07/2015  4:20 PM Full Code [id number]  Enedina Finner, MD Inpatient   04/07/2015 12:45 PM 04/08/2015  8:37 PM Full Code [id number]  Altamese Dilling, MD Inpatient   12/09/2014  2:44 PM 12/13/2014  4:16 PM Full Code [id number]  Gale Journey, MD Inpatient      TOTAL TIME TAKING CARE OF THIS PATIENT: 40 minutes.    Katharina Caper M.D on 07/05/2016 at 3:22 PM  Between 7am to 6pm - Pager - [phone]  After 6pm go to [email or web address] - password [email or web address] [facility]  [place] [facility]  Office  [phone]  CC:  Primary care physician; No PCP Per Patient

## 2016-07-05 NOTE — Care Management Note (Signed)
Case Management Note  Patient Details  Name: Trevor Brown MRN: [id number] Date of Birth: [birth year]  Subjective/Objective:     Met with patient at bedside. He is well known to this CM. Provided patient with [facility] and [facility] application. Please review note from medication management clinic from 06/11/2016. He is noncompliant and uncooperative. No further CM needs identified.              Action/Plan:   Expected Discharge Date:     07/05/2016             Expected Discharge Plan:  Home/Self Care  In-House Referral:     Discharge planning Services  CM Consult, Homebound not met per provider, [facility], Medication Assistance  Post Acute Care Choice:    Choice offered to:     DME Arranged:    DME Agency:     HH Arranged:    HH Agency:     Status of Service:  Completed, signed off  If discussed at [employer], dates discussed:    Additional Comments:  Jolly Mango, RN 07/05/2016, 3:23 PM

## 2016-08-12 ENCOUNTER — Inpatient Hospital Stay
Admission: EM | Admit: 2016-08-12 | Discharge: 2016-08-13 | DRG: 638 | Disposition: A | Discharge status: Home / Self Care | Payer: Self-pay | Attending: Internal Medicine | Admitting: Internal Medicine

## 2016-08-12 ENCOUNTER — Encounter: Payer: Self-pay | Admitting: Emergency Medicine

## 2016-08-12 DIAGNOSIS — I252 Old myocardial infarction: Secondary | ICD-10-CM

## 2016-08-12 DIAGNOSIS — Z9119 Patient's noncompliance with other medical treatment and regimen: Secondary | ICD-10-CM

## 2016-08-12 DIAGNOSIS — I10 Essential (primary) hypertension: Secondary | ICD-10-CM | POA: Diagnosis present

## 2016-08-12 DIAGNOSIS — Z833 Family history of diabetes mellitus: Secondary | ICD-10-CM

## 2016-08-12 DIAGNOSIS — Z880 Allergy status to penicillin: Secondary | ICD-10-CM

## 2016-08-12 DIAGNOSIS — T383X6A Underdosing of insulin and oral hypoglycemic [antidiabetic] drugs, initial encounter: Secondary | ICD-10-CM | POA: Diagnosis present

## 2016-08-12 DIAGNOSIS — Z885 Allergy status to narcotic agent status: Secondary | ICD-10-CM

## 2016-08-12 DIAGNOSIS — N289 Disorder of kidney and ureter, unspecified: Secondary | ICD-10-CM

## 2016-08-12 DIAGNOSIS — E1110 Type 2 diabetes mellitus with ketoacidosis without coma: Secondary | ICD-10-CM | POA: Diagnosis present

## 2016-08-12 DIAGNOSIS — E1310 Other specified diabetes mellitus with ketoacidosis without coma: Secondary | ICD-10-CM

## 2016-08-12 DIAGNOSIS — Z794 Long term (current) use of insulin: Secondary | ICD-10-CM

## 2016-08-12 DIAGNOSIS — K089 Disorder of teeth and supporting structures, unspecified: Secondary | ICD-10-CM | POA: Diagnosis present

## 2016-08-12 DIAGNOSIS — E871 Hypo-osmolality and hyponatremia: Secondary | ICD-10-CM | POA: Diagnosis present

## 2016-08-12 DIAGNOSIS — Z881 Allergy status to other antibiotic agents status: Secondary | ICD-10-CM

## 2016-08-12 DIAGNOSIS — Z91128 Patient's intentional underdosing of medication regimen for other reason: Secondary | ICD-10-CM

## 2016-08-12 DIAGNOSIS — E860 Dehydration: Secondary | ICD-10-CM | POA: Diagnosis present

## 2016-08-12 DIAGNOSIS — Z886 Allergy status to analgesic agent status: Secondary | ICD-10-CM

## 2016-08-12 DIAGNOSIS — Z91030 Bee allergy status: Secondary | ICD-10-CM

## 2016-08-12 DIAGNOSIS — E1010 Type 1 diabetes mellitus with ketoacidosis without coma: Principal | ICD-10-CM | POA: Diagnosis present

## 2016-08-12 DIAGNOSIS — Z8619 Personal history of other infectious and parasitic diseases: Secondary | ICD-10-CM

## 2016-08-12 DIAGNOSIS — F17210 Nicotine dependence, cigarettes, uncomplicated: Secondary | ICD-10-CM | POA: Diagnosis present

## 2016-08-12 LAB — CBC
HCT: 44.4 % (ref 40.0–52.0)
Hemoglobin: 15.2 g/dL (ref 13.0–18.0)
MCH: 29.7 pg (ref 26.0–34.0)
MCHC: 34.3 g/dL (ref 32.0–36.0)
MCV: 86.6 fL (ref 80.0–100.0)
Platelets: 271 10*3/uL (ref 150–440)
RBC: 5.12 MIL/uL (ref 4.40–5.90)
RDW: 13.3 % (ref 11.5–14.5)
WBC: 8.5 10*3/uL (ref 3.8–10.6)

## 2016-08-12 LAB — BASIC METABOLIC PANEL
Anion gap: 20 — ABNORMAL HIGH (ref 5–15)
BUN: 23 mg/dL — ABNORMAL HIGH (ref 6–20)
CO2: 16 mmol/L — ABNORMAL LOW (ref 22–32)
Calcium: 9.9 mg/dL (ref 8.9–10.3)
Chloride: 98 mmol/L — ABNORMAL LOW (ref 101–111)
Creatinine, Ser: 1.25 mg/dL — ABNORMAL HIGH (ref 0.61–1.24)
GFR calc Af Amer: >60 mL/min (ref >60)
GFR calc non Af Amer: >60 mL/min (ref >60)
Glucose, Bld: 452 mg/dL — ABNORMAL HIGH (ref 65–99)
Potassium: 5.1 mmol/L (ref 3.5–5.1)
Sodium: 134 mmol/L — ABNORMAL LOW (ref 135–145)

## 2016-08-12 LAB — URINALYSIS, COMPLETE (UACMP) WITH MICROSCOPIC
Bacteria, UA: NONE SEEN
Bilirubin Urine: NEGATIVE
Glucose, UA: >=500 mg/dL — AB
Hgb urine dipstick: NEGATIVE
Ketones, ur: 80 mg/dL — AB
Leukocytes, UA: NEGATIVE
Nitrite: NEGATIVE
Protein, ur: NEGATIVE mg/dL
RBC / HPF: NONE SEEN RBC/hpf (ref 0–5)
Specific Gravity, Urine: 1.02 (ref 1.005–1.030)
Squamous Epithelial / LPF: NONE SEEN
pH: 5 (ref 5.0–8.0)

## 2016-08-12 LAB — GLUCOSE, CAPILLARY
Glucose-Capillary: 197 mg/dL — ABNORMAL HIGH (ref 65–99)
Glucose-Capillary: 298 mg/dL — ABNORMAL HIGH (ref 65–99)
Glucose-Capillary: 370 mg/dL — ABNORMAL HIGH (ref 65–99)
Glucose-Capillary: 387 mg/dL — ABNORMAL HIGH (ref 65–99)
Glucose-Capillary: 430 mg/dL — ABNORMAL HIGH (ref 65–99)

## 2016-08-12 LAB — INFLUENZA PANEL BY PCR (TYPE A & B)
Influenza A By PCR: NEGATIVE
Influenza B By PCR: NEGATIVE

## 2016-08-12 MED ORDER — ONDANSETRON HCL 4 MG/2ML IJ SOLN: 4.0000 mg | Freq: Once | INTRAMUSCULAR | Status: DC

## 2016-08-12 MED ORDER — PANTOPRAZOLE SODIUM 40 MG IV SOLR
40.0000 mg | INTRAVENOUS | Status: DC
  Administered 2016-08-12: 40 mg via INTRAVENOUS
  Filled 2016-08-12: qty 40

## 2016-08-12 MED ORDER — STERILE WATER FOR INJECTION IJ SOLN
INTRAMUSCULAR | Status: AC
  Administered 2016-08-12: 10 mL
  Filled 2016-08-12: qty 10

## 2016-08-12 MED ORDER — HYDROCODONE-ACETAMINOPHEN 5-325 MG PO TABS
ORAL_TABLET | ORAL | Status: AC
Start: 2016-08-12 — End: 2016-08-12
  Administered 2016-08-12: 1 via ORAL
  Filled 2016-08-12: qty 1

## 2016-08-12 MED ORDER — DEXTROSE-NACL 5-0.45 % IV SOLN
INTRAVENOUS | Status: DC
  Administered 2016-08-12: 22:00:00 via INTRAVENOUS

## 2016-08-12 MED ORDER — ONDANSETRON HCL 4 MG/2ML IJ SOLN
4.0000 mg | Freq: Once | INTRAMUSCULAR | Status: AC
  Administered 2016-08-12: 4 mg via INTRAVENOUS

## 2016-08-12 MED ORDER — DEXTROSE-NACL 5-0.45 % IV SOLN: INTRAVENOUS | Status: DC

## 2016-08-12 MED ORDER — SODIUM CHLORIDE 0.9 % IV SOLN
INTRAVENOUS | Status: DC
  Administered 2016-08-12: 3.7 [IU]/h via INTRAVENOUS
  Filled 2016-08-12: qty 2.5

## 2016-08-12 MED ORDER — ENOXAPARIN SODIUM 30 MG/0.3ML ~~LOC~~ SOLN: 30.0000 mg | SUBCUTANEOUS | Status: DC

## 2016-08-12 MED ORDER — SODIUM CHLORIDE 0.9% FLUSH
3.0000 mL | Freq: Two times a day (BID) | INTRAVENOUS | Status: DC
  Administered 2016-08-12 – 2016-08-13 (×2): 3 mL via INTRAVENOUS

## 2016-08-12 MED ORDER — ONDANSETRON HCL 4 MG/2ML IJ SOLN
INTRAMUSCULAR | Status: AC
  Administered 2016-08-12: 4 mg
  Filled 2016-08-12: qty 2

## 2016-08-12 MED ORDER — ONDANSETRON HCL 4 MG/2ML IJ SOLN
INTRAMUSCULAR | Status: AC
  Filled 2016-08-12: qty 2

## 2016-08-12 MED ORDER — MORPHINE SULFATE (PF) 4 MG/ML IV SOLN
4.0000 mg | INTRAVENOUS | Status: DC | PRN
  Administered 2016-08-12: 4 mg via INTRAVENOUS
  Filled 2016-08-12: qty 1

## 2016-08-12 MED ORDER — ACETAMINOPHEN 325 MG PO TABS: 650.0000 mg | ORAL_TABLET | Freq: Four times a day (QID) | ORAL | Status: DC | PRN

## 2016-08-12 MED ORDER — ONDANSETRON HCL 4 MG/2ML IJ SOLN: 4.0000 mg | Freq: Four times a day (QID) | INTRAMUSCULAR | Status: DC | PRN

## 2016-08-12 MED ORDER — MORPHINE SULFATE (PF) 4 MG/ML IV SOLN
4.0000 mg | Freq: Once | INTRAVENOUS | Status: AC
Start: 2016-08-12 — End: 2016-08-12
  Administered 2016-08-12: 4 mg via INTRAVENOUS
  Filled 2016-08-12: qty 1

## 2016-08-12 MED ORDER — ACETAMINOPHEN 650 MG RE SUPP: 650.0000 mg | Freq: Four times a day (QID) | RECTAL | Status: DC | PRN

## 2016-08-12 MED ORDER — SODIUM CHLORIDE 0.9 % IV SOLN
Freq: Once | INTRAVENOUS | Status: AC
  Administered 2016-08-12: 14:00:00 via INTRAVENOUS

## 2016-08-12 MED ORDER — SODIUM CHLORIDE 0.9 % IV BOLUS (SEPSIS)
1000.0000 mL | Freq: Once | INTRAVENOUS | Status: AC
  Administered 2016-08-12: 1000 mL via INTRAVENOUS

## 2016-08-12 MED ORDER — SODIUM CHLORIDE 0.9 % IV SOLN: INTRAVENOUS | Status: DC

## 2016-08-12 MED ORDER — HYDROCODONE-ACETAMINOPHEN 5-325 MG PO TABS
1.0000 | ORAL_TABLET | ORAL | Status: DC | PRN
  Administered 2016-08-12: 1 via ORAL
  Administered 2016-08-13 (×3): 2 via ORAL
  Filled 2016-08-12 (×3): qty 2

## 2016-08-12 MED ORDER — ONDANSETRON HCL 4 MG PO TABS: 4.0000 mg | ORAL_TABLET | Freq: Four times a day (QID) | ORAL | Status: DC | PRN

## 2016-08-12 NOTE — ED Triage Notes (Signed)
Pt brought to ed via ems with reports of n/v/d started this am. Pt with hight blood sugar.

## 2016-08-12 NOTE — ED Notes (Signed)
Trevor Brown (brother of pt) contact number [phone]

## 2016-08-12 NOTE — H&P (Addendum)
[facility] Physicians - [place] at [facility]   PATIENT NAME: Trevor Brown    MR#:  [id number]  DATE OF BIRTH:  [birth year]  DATE OF ADMISSION:  08/12/2016  PRIMARY CARE PHYSICIAN: No PCP Per Patient   REQUESTING/REFERRING PHYSICIAN:   CHIEF COMPLAINT:   Chief Complaint  Patient presents with  . Hyperglycemia    HISTORY OF PRESENT ILLNESS: Trevor Brown  is a 26 y.o. male with a known history of Diabetes mellitus type 1, noncompliance, multiple medical admissions for DKA, hepatitis C, hypertension, who presents to the hospital with complaints of nausea, vomiting, feeling feverish, having left upper quadrant abdominal pain and just not feeling well. His labs reveal DKA. Hospitalist services were contacted for admission.   PAST MEDICAL HISTORY:   Past Medical History:  Diagnosis Date  . Diabetes mellitus without complication (HCC)   . Heart attack    Pt claims he had heart attack a year ago ( 2015)- and was admitted in [facility] for that, but not given any meds or angiogram, on review of chart- I could not find any details like that.  . Hepatitis C, acute may 2016  . Hypertension     PAST SURGICAL HISTORY: History reviewed. No pertinent surgical history.  SOCIAL HISTORY:  Social History  Substance Use Topics  . Smoking status: Current Every Day Smoker    Packs/day: 2.00    Types: Cigarettes  . Smokeless tobacco: Never Used  . Alcohol use No    FAMILY HISTORY:  Family History  Problem Relation Age of Onset  . Cirrhosis Mother   . Diabetes Mellitus II Maternal Grandmother     DRUG ALLERGIES:  Allergies  Allergen Reactions  . Bee Venom Anaphylaxis  . Penicillins Anaphylaxis, Hives and Other (See Comments)    Has patient had a PCN reaction causing immediate rash, facial/tongue/throat swelling, SOB or lightheadedness with hypotension: Yes Has patient had a PCN reaction causing severe rash involving mucus membranes or skin necrosis: No Has patient had a PCN  reaction that required hospitalization No Has patient had a PCN reaction occurring within the last 10 years: Yes If all of the above answers are "NO", then may proceed with Cephalosporin use.  . Ibuprofen Other (See Comments)    Reaction:  GI upset   . Tramadol Hives and Swelling  . Vancomycin Rash and Other (See Comments)    Reaction:  Red man's syndrome     Review of Systems  Unable to perform ROS: Mental acuity  Respiratory: Positive for cough.   Gastrointestinal: Positive for abdominal pain, nausea and vomiting.    MEDICATIONS AT HOME:  Prior to Admission medications   Medication Sig Start Date End Date Taking? Authorizing Provider  Chlorhexidine Gluconate Cloth 2 % PADS Apply 6 each topically daily at 6 (six) AM. 07/06/16  Yes Katharina Caper, MD  insulin aspart (NOVOLOG) 100 UNIT/ML injection 0-9 Units, Subcutaneous, 3 times daily before meals & bedtime, First dose on Fri 03/15/16 at 2200 Correction coverage: Sensitive (thin, NPO, renal) CBG < 70: implement hypoglycemia protocol CBG 70 - 120: 0 units CBG 121 - 150: 1 unit CBG 151 - 200: 2 units CBG 201 - 250: 3 units CBG 251 - 300: 5 units CBG 301 - 350: 7 units CBG 351 - 400: 9 units 03/16/16  Yes Wyatt Haste, MD  insulin aspart protamine- aspart (NOVOLOG MIX 70/30) (70-30) 100 UNIT/ML injection Inject 0.26 mLs (26 Units total) into the skin 2 (two) times daily. Patient taking differently:  Inject 30 Units into the skin 2 (two) times daily.  04/11/16  Yes Katharina Caper, MD  magic mouthwash SOLN Take 10 mLs by mouth 4 (four) times daily. 07/05/16  Yes Katharina Caper, MD  mupirocin ointment (BACTROBAN) 2 % Place 1 application into the nose 2 (two) times daily. 07/05/16  Yes Katharina Caper, MD      PHYSICAL EXAMINATION:   VITAL SIGNS: Blood pressure (!) 127/54, pulse (!) 121, temperature 98.5 F (36.9 C), temperature source Oral, resp. rate 20, weight 55.8 kg (123 lb), SpO2 100 %.  GENERAL:  26 y.o.-year-old patient flushed  face laying in the bed, very somnolent, able to open his eyes and briefly converses then drifts back to sleep  EYES: Pupils equal, round, reactive to light and accommodation. No scleral icterus. Extraocular muscles intact.  HEENT: Head atraumatic, normocephalic. Oropharynx and nasopharynx clear.  NECK:  Supple, no jugular venous distention. No thyroid enlargement, no tenderness.  LUNGS: Normal breath sounds bilaterally, no wheezing, rales,rhonchi or crepitation. No use of accessory muscles of respiration. No CVA tenderness bilaterally on percussion CARDIOVASCULAR: S1, S2 normal. No murmurs, rubs, or gallops.  ABDOMEN: Soft, tender in the left upper quadrant as well as periumbilically but no rebound, no guarding, nondistended. Bowel sounds present. No organomegaly or mass.  EXTREMITIES: No pedal edema, cyanosis, or clubbing.  NEUROLOGIC: Cranial nerves II through XII are intact. Muscle strength 5/5 in all extremities. Sensation decreased to light touch in distal lower extremities. Gait not checked.  PSYCHIATRIC: The patient is alert and oriented x 3.  SKIN: No obvious rash, lesion, or ulcer.   LABORATORY PANEL:   CBC  Recent Labs Lab 08/12/16 1344  WBC 8.5  HGB 15.2  HCT 44.4  PLT 271  MCV 86.6  MCH 29.7  MCHC 34.3  RDW 13.3   ------------------------------------------------------------------------------------------------------------------  Chemistries   Recent Labs Lab 08/12/16 1344  NA 134*  K 5.1  CL 98*  CO2 16*  GLUCOSE 452*  BUN 23*  CREATININE 1.25*  CALCIUM 9.9   ------------------------------------------------------------------------------------------------------------------  Cardiac Enzymes No results for input(s): TROPONINI in the last 168 hours. ------------------------------------------------------------------------------------------------------------------  RADIOLOGY: No results found.  EKG: Orders placed or performed during the hospital encounter of  08/12/16  . EKG 12-Lead  . EKG 12-Lead   In the dictation. EKG in emergency room, sinus tachycardia at rate of 10 3 bpm, biatrial enlargement, rightward axis, no acute STT changes    IMPRESSION AND PLAN:  Active Problems:   DKA, type 1 (HCC)   DKA (diabetic ketoacidoses) (HCC)   Hyponatremia   Dehydration   Acute renal insufficiency #1. DKA, admitted patient to medical floor, continue IV fluids and hydrate, insulin IV drip, patient claims that he ran out of insulin and was not able to get it because of this note, he called [facility], however, was told that they don't have any insulin for him in stock. He was recommended to follow-up with primary care physician who he does not, get a care manager involved as well #2. Hyponatremia, continue IV fluids at high rate, follow sodium level in the morning #3. Acute renal insufficiency, no previous urinary tract infection, continue IV fluids, follow creatinine in the morning #4. Dehydration, as above. Continue IV fluids. Follow physical exam in the morning #5. Nausea, vomiting, initiate patient on Protonix intravenously, supportive therapy, nothing by mouth for now #6 . Feverish, get influenza test  All the records are reviewed and case discussed with ED provider. Management plans discussed with the patient, family  and they are in agreement.  CODE STATUS: Code Status History    Date Active Date Inactive Code Status Order ID Comments User Context   07/04/2016 12:24 PM 07/04/2016 12:24 PM Full Code [id number]  Altamese DillingVaibhavkumar Vachhani, MD Inpatient   07/04/2016 12:24 PM 07/05/2016  7:36 PM Full Code [id number]  Altamese DillingVaibhavkumar Vachhani, MD Inpatient   04/09/2016  4:54 PM 04/11/2016  2:09 PM Full Code [id number]  Katharina Caperima Umeka Wrench, MD Inpatient   03/26/2016  1:18 AM 03/27/2016  7:11 PM Full Code [id number]  Tonye RoyaltyAlexis Hugelmeyer, DO Inpatient   03/18/2016  8:15 AM 03/21/2016  8:35 PM Full Code [id number]  Wyatt Hasteavid K Hower, MD ED   03/12/2016 12:20 PM 03/16/2016  7:27 PM Full Code  [id number]  Enedina FinnerSona Patel, MD Inpatient   02/18/2016  4:59 PM 02/19/2016  5:18 PM Full Code [id number]  Ramonita LabAruna Gouru, MD ED   01/12/2016  8:58 AM 01/15/2016  7:33 PM Full Code [id number]  Milagros LollSrikar Sudini, MD ED   01/11/2016 12:35 AM 01/11/2016 11:20 PM Full Code [id number]  Gery Prayebby Crosley, MD Inpatient   12/12/2015 10:04 PM 12/14/2015  6:38 PM Full Code [id number]  Katharina Caperima Cing , MD Inpatient   10/23/2015  5:42 PM 10/25/2015  7:04 PM Full Code [id number]  Katha HammingSnehalatha Konidena, MD ED   06/10/2015  8:20 AM 06/12/2015  2:41 PM Full Code [id number]  Arnaldo NatalMichael S Diamond, MD Inpatient   06/06/2015  8:43 PM 06/07/2015  4:20 PM Full Code [id number]  Enedina FinnerSona Patel, MD Inpatient   04/07/2015 12:45 PM 04/08/2015  8:37 PM Full Code [id number]  Altamese DillingVaibhavkumar Vachhani, MD Inpatient   12/09/2014  2:44 PM 12/13/2014  4:16 PM Full Code [id number]  Gale Journeyatherine P Walsh, MD Inpatient       TOTAL TIME TAKING CARE OF THIS PATIENT: 50 minutes.    Katharina CaperVAICKUTE,Samel Bruna M.D on 08/12/2016 at 8:20 PM  Between 7am to 6pm - Pager - [phone] After 6pm go to [email or web address] - password [email or web address] [facility]  [place] [facility]  Office  [phone]  CC: Primary care physician; No PCP Per Patient

## 2016-08-12 NOTE — ED Notes (Signed)
Pt actively vomiting at this time, MD notified

## 2016-08-12 NOTE — ED Notes (Signed)
Per MD start maintaince fluids when glucose is <250

## 2016-08-12 NOTE — ED Provider Notes (Signed)
[facility] Emergency Department Provider Note  Time seen: 6:46 PM  I have reviewed the triage vital signs and the nursing notes.   HISTORY  Chief Complaint Hyperglycemia    HPI Trevor Brown is a 26 y.o. male with a past medical history of diabetes, substance abuse, presents to the emergency department with nausea, vomiting, abdominal pain, feeling dehydrated and not taking insulin for the past 2 days. According to the patient he ran out of insulin 2 days ago, did not take anything yesterday or today. Has been feeling weak nauseated with vomiting and abdominal pain. Denies fever. Does state mild cough and congestion recently. Patient has been in DKA multiple times in the past.  Past Medical History:  Diagnosis Date  . Diabetes mellitus without complication (HCC)   . Heart attack    Pt claims he had heart attack a year ago ( 2015)- and was admitted in [facility] for that, but not given any meds or angiogram, on review of chart- I could not find any details like that.  . Hepatitis C, acute may 2016  . Hypertension     Patient Active Problem List   Diagnosis Date Noted  . 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 Dehydration 07/05/2016  . Headache 07/05/2016  . Mouth sores 07/05/2016  . Acute gastroenteritis 04/11/2016  . Hypotension 04/11/2016  . Hyponatremia 04/11/2016  . Leukocytosis 04/11/2016  . Elevated transaminase level 03/27/2016  . Diffuse abdominal pain 03/27/2016  . Anemia 03/27/2016  . Diabetes mellitus type 1 (HCC) 03/27/2016  . Tobacco abuse counseling 03/27/2016  . Transaminitis 03/25/2016  . Tobacco abuse 01/10/2016  . Type 1 diabetes mellitus with hyperglycemia (HCC) 12/12/2015  . Cocaine abuse 12/12/2015  . DKA (diabetic ketoacidoses) (HCC) 06/10/2015  . Hidradenitis suppurativa of left axilla   . DKA, type 1 (HCC) 06/06/2015  . Malnutrition of moderate degree (HCC) 04/08/2015  . Hepatitis C 12/13/2014  . Diabetes type 1, uncontrolled (HCC)  12/11/2014  . Major depressive disorder, single episode, mild (HCC)   . Major depression, single episode 12/10/2014  . Diabetes mellitus type 1, uncontrolled (HCC) 12/09/2014  . Homelessness 12/09/2014    History reviewed. No pertinent surgical history.  Prior to Admission medications   Medication Sig Start Date End Date Taking? Authorizing Provider  Chlorhexidine Gluconate Cloth 2 % PADS Apply 6 each topically daily at 6 (six) AM. 07/06/16   Katharina Caper, MD  insulin aspart (NOVOLOG) 100 UNIT/ML injection 0-9 Units, Subcutaneous, 3 times daily before meals & bedtime, First dose on Fri 03/15/16 at 2200 Correction coverage: Sensitive (thin, NPO, renal) CBG < 70: implement hypoglycemia protocol CBG 70 - 120: 0 units CBG 121 - 150: 1 unit CBG 151 - 200: 2 units CBG 201 - 250: 3 units CBG 251 - 300: 5 units CBG 301 - 350: 7 units CBG 351 - 400: 9 units Patient not taking: Reported on 07/04/2016 03/16/16   Wyatt Haste, MD  insulin aspart protamine- aspart (NOVOLOG MIX 70/30) (70-30) 100 UNIT/ML injection Inject 0.26 mLs (26 Units total) into the skin 2 (two) times daily. Patient taking differently: Inject 30 Units into the skin 2 (two) times daily.  04/11/16   Katharina Caper, MD  magic mouthwash SOLN Take 10 mLs by mouth 4 (four) times daily. 07/05/16   Katharina Caper, MD  mupirocin ointment (BACTROBAN) 2 % Place 1 application into the nose 2 (two) times daily. 07/05/16   Katharina Caper, MD    Allergies  Allergen Reactions  .  Bee Venom Anaphylaxis  . Penicillins Anaphylaxis, Hives and Other (See Comments)    Has patient had a PCN reaction causing immediate rash, facial/tongue/throat swelling, SOB or lightheadedness with hypotension: Yes Has patient had a PCN reaction causing severe rash involving mucus membranes or skin necrosis: No Has patient had a PCN reaction that required hospitalization No Has patient had a PCN reaction occurring within the last 10 years: Yes If all of the above  answers are "NO", then may proceed with Cephalosporin use.  . Ibuprofen Other (See Comments)    Reaction:  GI upset   . Tramadol Hives and Swelling  . Vancomycin Rash and Other (See Comments)    Reaction:  Red man's syndrome     Family History  Problem Relation Age of Onset  . Cirrhosis Mother   . Diabetes Mellitus II Maternal Grandmother     Social History Social History  Substance Use Topics  . Smoking status: Current Every Day Smoker    Packs/day: 2.00    Types: Cigarettes  . Smokeless tobacco: Never Used  . Alcohol use No    Review of Systems Constitutional: Negative for fever. Cardiovascular: Negative for chest pain. Respiratory: Negative for shortness of breath. Gastrointestinal: Positive for abdominal pain nausea and vomiting. Genitourinary: Negative for dysuria. Neurological: Positive for headache. 10-point ROS otherwise negative.  ____________________________________________   PHYSICAL EXAM:  VITAL SIGNS: ED Triage Vitals [08/12/16 1342]  Enc Vitals Group     BP 118/63     Pulse Rate (!) 121     Resp (!) 22     Temp 98.5 F (36.9 C)     Temp Source Oral     SpO2 100 %     Weight 123 lb (55.8 kg)     Height      Head Circumference      Peak Flow      Pain Score 8     Pain Loc      Pain Edu?      Excl. in GC?     Constitutional: Alert. Active vomiting in the emergency department. Mild distress. Eyes: Normal exam ENT   Head: Normocephalic and atraumatic.   Mouth/Throat: Dry mucous membranes. Cardiovascular: Regular rhythm and rate around 120 bpm. Respiratory: Normal respiratory effort without tachypnea nor retractions. Breath sounds are clear and equal bilaterally. No wheezes/rales/rhonchi. Gastrointestinal: Soft, mild diffuse abdominal tenderness. No rebound or guarding. No distention. Musculoskeletal: Nontender with normal range of motion in all extremities.  Neurologic:  Normal speech and language. No gross focal neurologic  deficits Skin:  Skin is warm, dry and intact.  Psychiatric: Mood and affect are normal.  ____________________________________________    EKG  EKG reviewed and interpreted by myself shows sinus tachycardia 103 bpm. Narrow QRS, normal axis, normal intervals with nonspecific ST changes. No ST elevation.  ____________________________________________   INITIAL IMPRESSION / ASSESSMENT AND PLAN / ED COURSE  Pertinent labs & imaging results that were available during my care of the patient were reviewed by me and considered in my medical decision making (see chart for details).  The patient presents to the emergency department with nausea vomiting abdominal discomfort out of insulin for the past 2-3 days. Patient's labs show hyperglycemia with an anion gap consistent with diabetic ketoacidosis. We'll obtain a VBG did confirm. Patient's potassium is 5.1 we will start on an insulin drip, IV fluids, treat pain and nausea. Patient will require admission to the hospital.  CRITICAL CARE Performed by: Minna AntisPADUCHOWSKI, Rafeef Lau   Total critical  care time: 30 minutes  Critical care time was exclusive of separately billable procedures and treating other patients.  Critical care was necessary to treat or prevent imminent or life-threatening deterioration.  Critical care was time spent personally by me on the following activities: development of treatment plan with patient and/or surrogate as well as nursing, discussions with consultants, evaluation of patient's response to treatment, examination of patient, obtaining history from patient or surrogate, ordering and performing treatments and interventions, ordering and review of laboratory studies, ordering and review of radiographic studies, pulse oximetry and re-evaluation of patient's condition.   ____________________________________________   FINAL CLINICAL IMPRESSION(S) / ED DIAGNOSES  Diabetic ketoacidosis    Minna Antis, MD 08/12/16  [phone]

## 2016-08-13 LAB — BASIC METABOLIC PANEL
Anion gap: 8 (ref 5–15)
Anion gap: 11 (ref 5–15)
BUN: 20 mg/dL (ref 6–20)
BUN: 20 mg/dL (ref 6–20)
CO2: 19 mmol/L — ABNORMAL LOW (ref 22–32)
CO2: 19 mmol/L — ABNORMAL LOW (ref 22–32)
Calcium: 8.9 mg/dL (ref 8.9–10.3)
Calcium: 8.7 mg/dL — ABNORMAL LOW (ref 8.9–10.3)
Chloride: 113 mmol/L — ABNORMAL HIGH (ref 101–111)
Chloride: 110 mmol/L (ref 101–111)
Creatinine, Ser: 1.01 mg/dL (ref 0.61–1.24)
Creatinine, Ser: 0.85 mg/dL (ref 0.61–1.24)
GFR calc Af Amer: >60 mL/min (ref >60)
GFR calc Af Amer: >60 mL/min (ref >60)
GFR calc non Af Amer: >60 mL/min (ref >60)
GFR calc non Af Amer: >60 mL/min (ref >60)
Glucose, Bld: 160 mg/dL — ABNORMAL HIGH (ref 65–99)
Glucose, Bld: 86 mg/dL (ref 65–99)
Potassium: 4.1 mmol/L (ref 3.5–5.1)
Potassium: 4.1 mmol/L (ref 3.5–5.1)
Sodium: 140 mmol/L (ref 135–145)
Sodium: 140 mmol/L (ref 135–145)

## 2016-08-13 LAB — CBC
HCT: 37.2 % — ABNORMAL LOW (ref 40.0–52.0)
Hemoglobin: 12.7 g/dL — ABNORMAL LOW (ref 13.0–18.0)
MCH: 29.2 pg (ref 26.0–34.0)
MCHC: 34 g/dL (ref 32.0–36.0)
MCV: 85.9 fL (ref 80.0–100.0)
Platelets: 244 10*3/uL (ref 150–440)
RBC: 4.33 MIL/uL — ABNORMAL LOW (ref 4.40–5.90)
RDW: 13.4 % (ref 11.5–14.5)
WBC: 11.2 10*3/uL — ABNORMAL HIGH (ref 3.8–10.6)

## 2016-08-13 LAB — GLUCOSE, CAPILLARY
Glucose-Capillary: 100 mg/dL — ABNORMAL HIGH (ref 65–99)
Glucose-Capillary: 128 mg/dL — ABNORMAL HIGH (ref 65–99)
Glucose-Capillary: 148 mg/dL — ABNORMAL HIGH (ref 65–99)
Glucose-Capillary: 158 mg/dL — ABNORMAL HIGH (ref 65–99)
Glucose-Capillary: 218 mg/dL — ABNORMAL HIGH (ref 65–99)
Glucose-Capillary: 276 mg/dL — ABNORMAL HIGH (ref 65–99)
Glucose-Capillary: 292 mg/dL — ABNORMAL HIGH (ref 65–99)
Glucose-Capillary: 372 mg/dL — ABNORMAL HIGH (ref 65–99)
Glucose-Capillary: 71 mg/dL (ref 65–99)
Glucose-Capillary: 87 mg/dL (ref 65–99)

## 2016-08-13 LAB — MRSA PCR SCREENING: MRSA by PCR: POSITIVE — AB

## 2016-08-13 MED ORDER — INSULIN ASPART 100 UNIT/ML ~~LOC~~ SOLN
0.0000 [IU] | Freq: Three times a day (TID) | SUBCUTANEOUS | Status: DC
Start: 2016-08-13 — End: 2016-08-13
  Administered 2016-08-13: 5 [IU] via SUBCUTANEOUS
  Filled 2016-08-13: qty 5

## 2016-08-13 MED ORDER — INSULIN ASPART PROT & ASPART (70-30 MIX) 100 UNIT/ML ~~LOC~~ SUSP: 30.0000 [IU] | Freq: Two times a day (BID) | SUBCUTANEOUS | 0 refills | Status: DC

## 2016-08-13 MED ORDER — INSULIN ASPART 100 UNIT/ML ~~LOC~~ SOLN: SUBCUTANEOUS | 0 refills | Status: DC

## 2016-08-13 MED ORDER — SODIUM CHLORIDE 0.9 % IV SOLN
INTRAVENOUS | Status: DC
  Administered 2016-08-13: 04:00:00 via INTRAVENOUS

## 2016-08-13 MED ORDER — CHLORHEXIDINE GLUCONATE CLOTH 2 % EX PADS
6.0000 | MEDICATED_PAD | Freq: Every day | CUTANEOUS | Status: DC
  Administered 2016-08-13: 6 via TOPICAL

## 2016-08-13 MED ORDER — INSULIN ASPART 100 UNIT/ML ~~LOC~~ SOLN: 0.0000 [IU] | Freq: Every day | SUBCUTANEOUS | Status: DC

## 2016-08-13 MED ORDER — INSULIN ASPART 100 UNIT/ML ~~LOC~~ SOLN
0.0000 [IU] | Freq: Three times a day (TID) | SUBCUTANEOUS | Status: DC
  Administered 2016-08-13: 11 [IU] via SUBCUTANEOUS
  Filled 2016-08-13: qty 11

## 2016-08-13 MED ORDER — INSULIN DETEMIR 100 UNIT/ML ~~LOC~~ SOLN
12.0000 [IU] | Freq: Every day | SUBCUTANEOUS | Status: DC
  Administered 2016-08-13: 12 [IU] via SUBCUTANEOUS
  Filled 2016-08-13: qty 0.12

## 2016-08-13 MED ORDER — MUPIROCIN 2 % EX OINT
1.0000 "application " | TOPICAL_OINTMENT | Freq: Two times a day (BID) | CUTANEOUS | Status: DC
  Administered 2016-08-13 (×2): 1 via NASAL
  Filled 2016-08-13: qty 22

## 2016-08-13 MED ORDER — INSULIN ASPART 100 UNIT/ML ~~LOC~~ SOLN
6.0000 [IU] | Freq: Three times a day (TID) | SUBCUTANEOUS | Status: DC
  Administered 2016-08-13: 6 [IU] via SUBCUTANEOUS
  Filled 2016-08-13: qty 6

## 2016-08-13 MED ORDER — PANTOPRAZOLE SODIUM 40 MG PO TBEC: 40.0000 mg | DELAYED_RELEASE_TABLET | Freq: Every day | ORAL | Status: DC

## 2016-08-13 MED ORDER — ENOXAPARIN SODIUM 40 MG/0.4ML ~~LOC~~ SOLN: 40.0000 mg | SUBCUTANEOUS | Status: DC

## 2016-08-13 NOTE — Discharge Summary (Signed)
[employer] [facility] [place] at [facility]   PATIENT NAME: Trevor Brown    MR#:  [id number]  DATE OF BIRTH:  18-Nov-1990  DATE OF ADMISSION:  08/12/2016 ADMITTING PHYSICIAN: Katharina Caper, MD  DATE OF DISCHARGE: 08/13/2016  PRIMARY CARE PHYSICIAN: No PCP Per Patient    ADMISSION DIAGNOSIS:  Diabetic ketoacidosis without coma associated with other specified diabetes mellitus (HCC) [E13.10]  DISCHARGE DIAGNOSIS:  Active Problems:   DKA, type 1 (HCC)   DKA (diabetic ketoacidoses) (HCC)   Hyponatremia   Dehydration   Acute renal insufficiency   SECONDARY DIAGNOSIS:   Past Medical History:  Diagnosis Date  . Diabetes mellitus without complication (HCC)   . Heart attack    Pt claims he had heart attack a year ago ( 2015)- and was admitted in [facility] for that, but not given any meds or angiogram, on review of chart- I could not find any details like that.  . Hepatitis C, acute may 2016  . Hypertension     HOSPITAL COURSE:   26 year old male with type 1 diabetes insulin-dependent who presents in DKA. 1. DKA: Due to the snout patient was unable to pick up his prescriptions for insulin and therefore went into DKA. He was treated with DKA protocol. His DKA has resolved. He will continue outpatient medications.  2. Poor dentition: Patient will need referral to dentist however does not have dental insurance.  3. Acute kidney injury due to DKA which is resolved.   DISCHARGE CONDITIONS AND DIET:   Stable for discharge on diabetic diet  CONSULTS OBTAINED:    DRUG ALLERGIES:   Allergies  Allergen Reactions  . Bee Venom Anaphylaxis  . Penicillins Anaphylaxis, Hives and Other (See Comments)    Has patient had a PCN reaction causing immediate rash, facial/tongue/throat swelling, SOB or lightheadedness with hypotension: Yes Has patient had a PCN reaction causing severe rash involving mucus membranes or skin necrosis: No Has patient had a PCN reaction that required  hospitalization No Has patient had a PCN reaction occurring within the last 10 years: Yes If all of the above answers are "NO", then may proceed with Cephalosporin use.  . Ibuprofen Other (See Comments)    Reaction:  GI upset   . Tramadol Hives and Swelling  . Vancomycin Rash and Other (See Comments)    Reaction:  Red man's syndrome     DISCHARGE MEDICATIONS:   Current Discharge Medication List    CONTINUE these medications which have CHANGED   Details  insulin aspart (NOVOLOG) 100 UNIT/ML injection 0-9 Units, Subcutaneous, 3 times daily before meals & bedtime, First dose on Fri 03/15/16 at 2200 Correction coverage: Sensitive (thin, NPO, renal) CBG < 70: implement hypoglycemia protocol CBG 70 - 120: 0 units CBG 121 - 150: 1 unit CBG 151 - 200: 2 units CBG 201 - 250: 3 units CBG 251 - 300: 5 units CBG 301 - 350: 7 units CBG 351 - 400: 9 units Qty: 10 mL, Refills: 0    insulin aspart protamine- aspart (NOVOLOG MIX 70/30) (70-30) 100 UNIT/ML injection Inject 0.3 mLs (30 Units total) into the skin 2 (two) times daily. Qty: 18 mL, Refills: 0      CONTINUE these medications which have NOT CHANGED   Details  Chlorhexidine Gluconate Cloth 2 % PADS Apply 6 each topically daily at 6 (six) AM. Qty: 6 each, Refills: 0    magic mouthwash SOLN Take 10 mLs by mouth 4 (four) times daily. Qty: 200 mL, Refills: 4  mupirocin ointment (BACTROBAN) 2 % Place 1 application into the nose 2 (two) times daily. Qty: 22 g, Refills: 0              Today   CHIEF COMPLAINT:  No acute events overnight.   VITAL SIGNS:  Blood pressure 107/62, pulse 87, temperature 97.7 F (36.5 C), temperature source Axillary, resp. rate 17, height 5\' 1"  (1.549 m), weight 57.2 kg (126 lb 1.7 oz), SpO2 100 %.   REVIEW OF SYSTEMS:  Review of Systems  Constitutional: Negative.  Negative for chills, fever and malaise/fatigue.  HENT: Negative.  Negative for ear discharge, ear pain, hearing loss,  nosebleeds and sore throat.        Very poor dentition and oral care  Eyes: Negative.  Negative for blurred vision and pain.  Respiratory: Negative.  Negative for cough, hemoptysis, shortness of breath and wheezing.   Cardiovascular: Negative.  Negative for chest pain, palpitations and leg swelling.  Gastrointestinal: Negative.  Negative for abdominal pain, blood in stool, diarrhea, nausea and vomiting.  Genitourinary: Negative.  Negative for dysuria.  Musculoskeletal: Negative.  Negative for back pain.  Skin: Negative.   Neurological: Negative for dizziness, tremors, speech change, focal weakness, seizures and headaches.  Endo/Heme/Allergies: Negative.  Does not bruise/bleed easily.  Psychiatric/Behavioral: Negative.  Negative for depression, hallucinations and suicidal ideas.     PHYSICAL EXAMINATION:  GENERAL:  26 y.o.-year-old patient lying in the bed with no acute distress.  NECK:  Supple, no jugular venous distention. No thyroid enlargement, no tenderness.  Teeth show very poor dentition and oral care LUNGS: Normal breath sounds bilaterally, no wheezing, rales,rhonchi  No use of accessory muscles of respiration.  CARDIOVASCULAR: S1, S2 normal. No murmurs, rubs, or gallops.  ABDOMEN: Soft, non-tender, non-distended. Bowel sounds present. No organomegaly or mass.  EXTREMITIES: No pedal edema, cyanosis, or clubbing.  PSYCHIATRIC: The patient is alert and oriented x 3.  SKIN: No obvious rash, lesion, or ulcer.   DATA REVIEW:   CBC  Recent Labs Lab 08/13/16 0600  WBC 11.2*  HGB 12.7*  HCT 37.2*  PLT 244    Chemistries   Recent Labs Lab 08/13/16 0600  NA 140  K 4.1  CL 110  CO2 19*  GLUCOSE 86  BUN 20  CREATININE 0.85  CALCIUM 8.7*    Cardiac Enzymes No results for input(s): TROPONINI in the last 168 hours.  Microbiology Results  @MICRORSLT48 @  RADIOLOGY:  No results found.    Management plans discussed with the patient and he is in agreement. Stable  for discharge   [facility]  CODE STATUS:     Code Status Orders        Start     Ordered   08/12/16 2221  Full code  Continuous     08/12/16 2220    Code Status History    Date Active Date Inactive Code Status Order ID Comments User Context   07/04/2016 12:24 PM 07/04/2016 12:24 PM Full Code [id number]  Altamese DillingVaibhavkumar Vachhani, MD Inpatient   07/04/2016 12:24 PM 07/05/2016  7:36 PM Full Code [id number]  Altamese DillingVaibhavkumar Vachhani, MD Inpatient   04/09/2016  4:54 PM 04/11/2016  2:09 PM Full Code [id number]  Katharina Caperima Vaickute, MD Inpatient   03/26/2016  1:18 AM 03/27/2016  7:11 PM Full Code [id number]  Tonye RoyaltyAlexis Hugelmeyer, DO Inpatient   03/18/2016  8:15 AM 03/21/2016  8:35 PM Full Code [id number]  Wyatt Hasteavid K Hower, MD ED   03/12/2016 12:20 PM  03/16/2016  7:27 PM Full Code [id number]  Enedina Finner, MD Inpatient   02/18/2016  4:59 PM 02/19/2016  5:18 PM Full Code [id number]  Ramonita Lab, MD ED   01/12/2016  8:58 AM 01/15/2016  7:33 PM Full Code [id number]  Milagros Loll, MD ED   01/11/2016 12:35 AM 01/11/2016 11:20 PM Full Code [id number]  Gery Pray, MD Inpatient   12/12/2015 10:04 PM 12/14/2015  6:38 PM Full Code [id number]  Katharina Caper, MD Inpatient   10/23/2015  5:42 PM 10/25/2015  7:04 PM Full Code [id number]  Katha Hamming, MD ED   06/10/2015  8:20 AM 06/12/2015  2:41 PM Full Code [id number]  Arnaldo Natal, MD Inpatient   06/06/2015  8:43 PM 06/07/2015  4:20 PM Full Code [id number]  Enedina Finner, MD Inpatient   04/07/2015 12:45 PM 04/08/2015  8:37 PM Full Code [id number]  Altamese Dilling, MD Inpatient   12/09/2014  2:44 PM 12/13/2014  4:16 PM Full Code [id number]  Gale Journey, MD Inpatient      TOTAL TIME TAKING CARE OF THIS PATIENT: 35 minutes.    Note: This dictation was prepared with Dragon dictation along with smaller phrase technology. Any transcriptional errors that result from this process are unintentional.  Yanisa Goodgame M.D on 08/13/2016 at 8:35 AM  Between 7am to 6pm -  Pager - [phone] After 6pm go to [email or web address] - Social research officer, government  [facility] [place] [facility] [place] [facility]  Office  [phone]  CC: Primary care physician; No PCP Per Patient

## 2016-08-13 NOTE — Progress Notes (Signed)
CONCERNING: IV to Oral Route Change Policy  RECOMMENDATION: This patient is receiving pantoprazole by the intravenous route.  Based on criteria approved by the Pharmacy and Therapeutics Committee, the intravenous medication(s) is/are being converted to the equivalent oral dose form(s).   DESCRIPTION: These criteria include:  The patient is eating (either orally or via tube) and/or has been taking other orally administered medications for a least 24 hours  The patient has no evidence of active gastrointestinal bleeding or impaired GI absorption (gastrectomy, short bowel, patient on TNA or NPO).  If you have questions about this conversion, please contact the Pharmacy Department  []   [phone]( [phone] )  [facility] [x]   [phone]( [phone] )  [facility] []   [phone]( [phone] )  [facility] []   [phone]( [phone] )  [facility] []   [phone]( [phone] )  [facility]   Deveron Shamoon L, [facility] 08/13/2016 9:23 AM

## 2016-08-13 NOTE — Progress Notes (Signed)
Reinforced instructions with patient to take his scrips across the street to [facility] before catching [employer] to his current residence on [address].  Nursing student assigned to Clinical research associatewriter gave patient one dollar to ride the bus (he stated this is what is costs to ride the bus but he had no money).  He verbalized understanding of discharge instructions but expressed no convictions to carry them out.  Writer reminded him that our Diabetes team [facility] at [facility] feels that his dental health is exacerbating his diabetes (used layman's terms to explain), and it is crucial that he follow up with a dentist.  Again, he merely nods his head but does seem convicted to care for his health.  When asked earlier why he did not follow up with the [facility], local dentists, or [facility], he said, "I don't know."

## 2016-08-13 NOTE — Progress Notes (Signed)
Inpatient Diabetes Program Recommendations  AACE/ADA: New Consensus Statement on Inpatient Glycemic Control (2015)  Target Ranges:  Prepandial:   less than 140 mg/dL      Peak postprandial:   less than 180 mg/dL (1-2 hours)      Critically ill patients:  140 - 180 mg/dL   Lab Results  Component Value Date   GLUCAP 276 (H) 08/13/2016   HGBA1C 9.8 (H) 04/09/2016    Review of Glycemic Control  Results for Trevor Brown, Morrie B (MRN [id number]) as of 08/13/2016 08:19  Ref. Range 08/13/2016 02:56 08/13/2016 03:59 08/13/2016 05:10 08/13/2016 06:12 08/13/2016 07:32  Glucose-Capillary Latest Ref Range: 65 - 99 mg/dL 409128 (H) 811100 (H) 87 71 914276 (H)    Diabetes history: DM1 Outpatient Diabetes medications: 70/30 30 units BID, Novolog 0-9 units ACHS Current orders for Inpatient glycemic control:Lantus 12 units qhs, Novolog 6 units tid,   Novolog 0-20 units TID with meals, Novolog 0-5 units QHS  Inpatient Diabetes Program Recommendations: Insulin - Basal: Please consider changing patient to 70/30 to 30 units BID beginning this evening (pre-supper).  Please decrease Novolog correction to sensitive scale 0-9 units tid   NOTE: NURSING: Please do not administer any insulin injections in patient's arms. Only use abdomen (rotating injection sites) for insulin administration while inpatient.   **see medication management note dated 06/11/16 regarding medications/healthcare adherence   Susette RacerJulie Jynesis Nakamura, RN, BA, MHA, CDE Diabetes Coordinator Inpatient Diabetes Program  [phone] (Team Pager) [phone] [facility] Office) 08/13/2016 8:25 AM

## 2016-08-13 NOTE — Progress Notes (Signed)
Lovenox changed to 40 mg daily for CrCl >30 and BMI <40. 

## 2016-08-13 NOTE — Care Management (Signed)
Patient is well know to RNCMers and CSWers at this hospital. He has been referred to [facility] and they have arranged dental clinics for patient in the past but patient does not follow through. He lives with his father- he has been put in housing at homeless shelter and apparently asked to leave due to behavior. I have requested psych evaluation to see if patient has a mental handicap but I cannot find those results in his record. He has been given at least 3 glucometers and multiple prescriptions from this RNCM in past visits. He has been referred to Medication management also in the past. [facility] have [facility] of patient discharge today. There is nothing more this hospital can offer as we have put local resources in place and patient does not follow through. I have even talked to patient on two occassions about the seriousness of his diabetes and caring for himself including his teeth.

## 2016-08-13 NOTE — Progress Notes (Addendum)
CM notified by writer that patient has no glucometer, strips, lancets, or syringes.  Because he has been given multiple glucometers by [facility] in the past we can no longer supply them to him.  Pt states he "lost all of them" when he moved.  He has been given multiple appts with [facility] and dentists in the area but has not followed through.  Writer questions whether this patient is sufficiently mature or cognitively aware enough to understand the nature of his medical problems.  He is reticent and affect is flat when writer tries to talk with him about the dangers of excessively high blood sugar,smoking, and other harmful behaviors.  Will contact physician once more to ensure we have done all we can for this patient before discharging him, as Clinical research associatewriter fears he will be returning to the ED very soon.  Addendum:  Dr Juliene PinaMody provided prescriptions for glucometer, lancets, test strips, and syringes.  Patient will be instructed by this writer to go to [facility] across the [facility] immediately after he leaves [facility] to get his insulin and supplies.

## 2016-08-13 NOTE — Progress Notes (Signed)
Chaplain was making rounds and visited with pt in room IC-4. Provided the ministry of prayer and a pastoral presence.    08/13/16 1155  Clinical Encounter Type  Visited With Patient  Visit Type Initial;Spiritual support  Referral From Nurse  Spiritual Encounters  Spiritual Needs Prayer

## 2016-08-14 LAB — BLOOD GAS, VENOUS
Acid-base deficit: 17 mmol/L — ABNORMAL HIGH (ref 0.0–2.0)
Bicarbonate: 10.9 mmol/L — ABNORMAL LOW (ref 20.0–28.0)
Patient temperature: 37
pCO2, Ven: 32 mmHg — ABNORMAL LOW (ref 44.0–60.0)
pH, Ven: 7.14 — CL (ref 7.250–7.430)

## 2016-08-20 IMAGING — CR DG CHEST 2V
2 series · 2 of 2 positions shown · non-contrast
Comparison: 01/12/2016

CLINICAL DATA: Hyperglycemia

EXAM:
CHEST  2 VIEW

[chest pa]
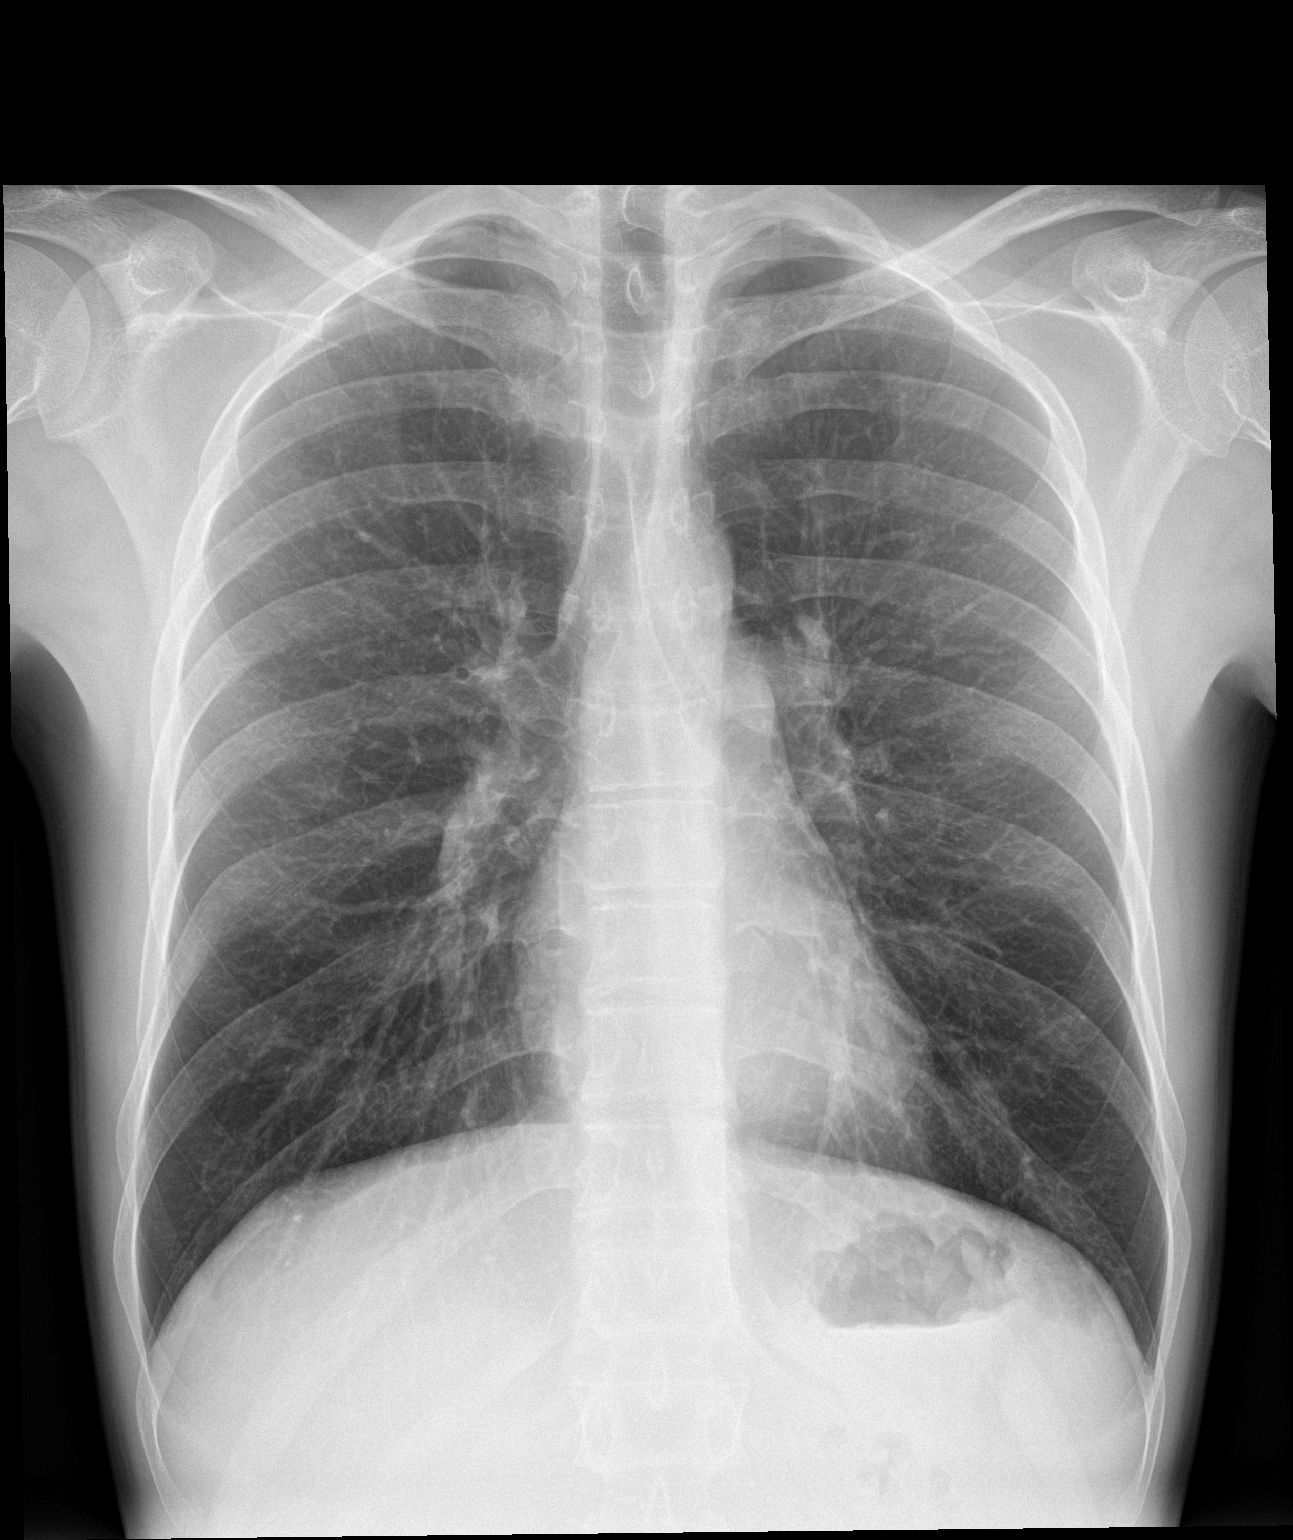

[chest lat]
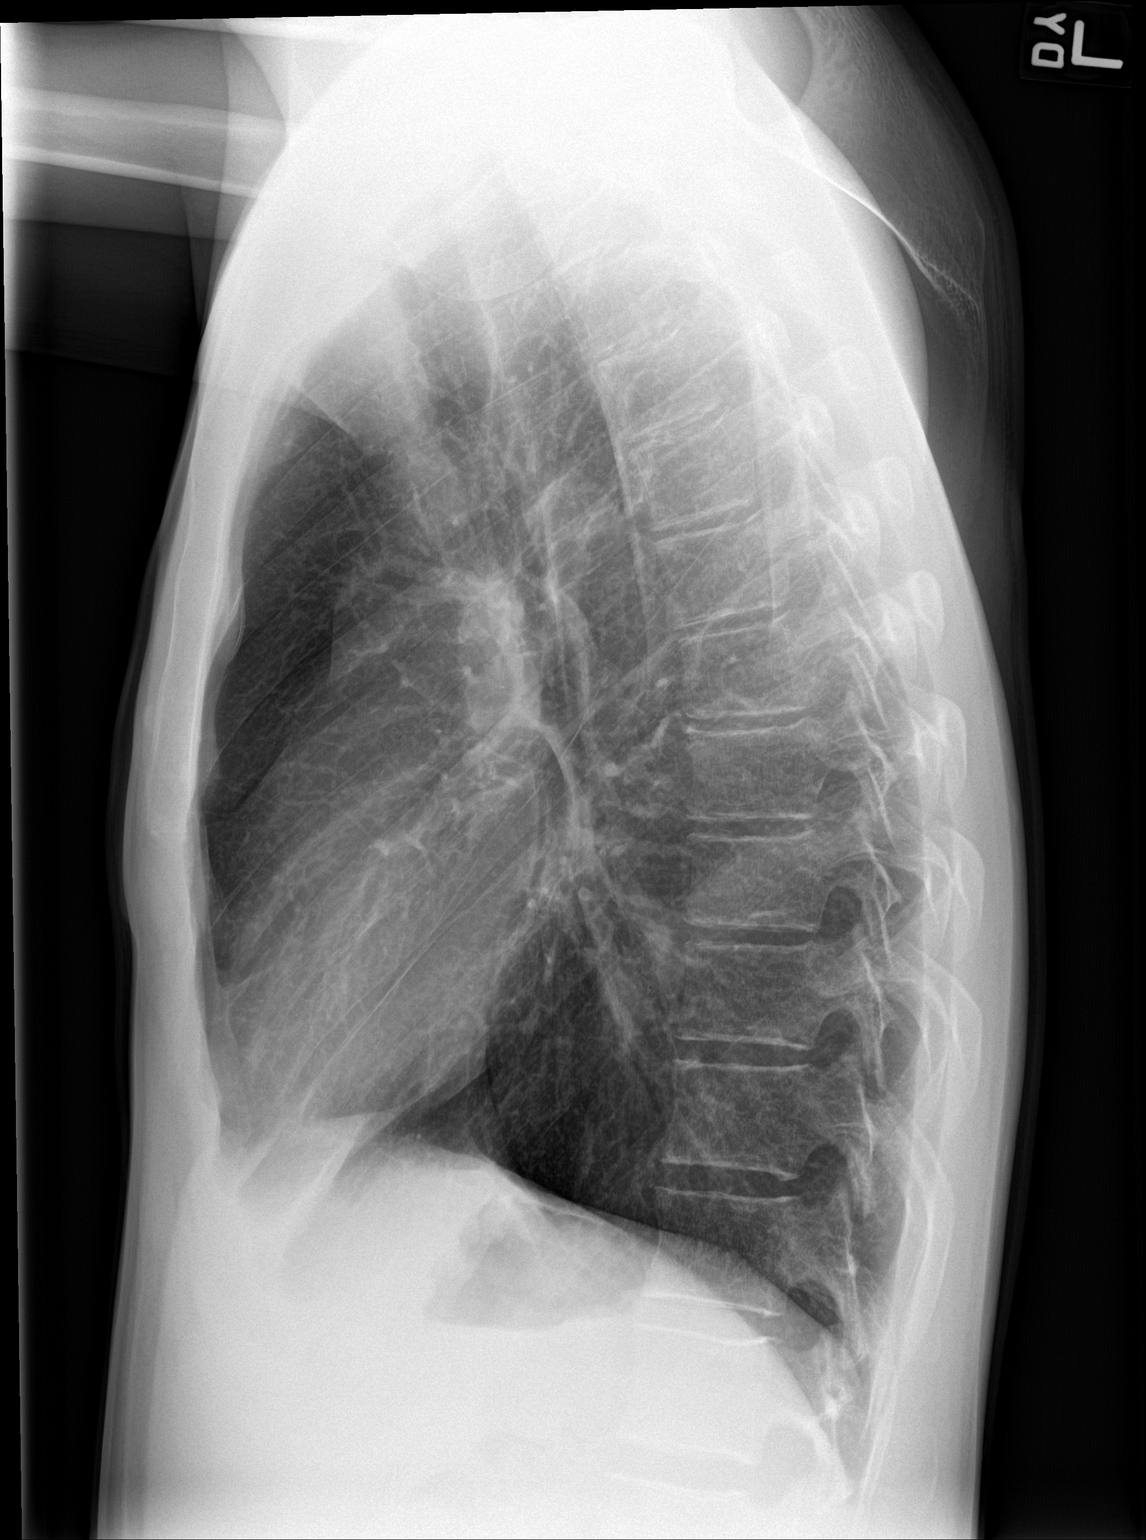

[2 of 2 positions shown; findings below may reference images not displayed]

FINDINGS: The heart size and mediastinal contours are within normal limits.
Both lungs are clear. The visualized skeletal structures are
unremarkable.
IMPRESSION: No active cardiopulmonary disease.

## 2016-09-07 ENCOUNTER — Encounter: Payer: Self-pay | Admitting: Emergency Medicine

## 2016-09-07 ENCOUNTER — Inpatient Hospital Stay
Admission: EM | Admit: 2016-09-07 | Discharge: 2016-09-08 | DRG: 638 | Disposition: A | Discharge status: Home / Self Care | Payer: Self-pay | Attending: Internal Medicine | Admitting: Internal Medicine

## 2016-09-07 DIAGNOSIS — E1010 Type 1 diabetes mellitus with ketoacidosis without coma: Principal | ICD-10-CM | POA: Diagnosis present

## 2016-09-07 DIAGNOSIS — F1490 Cocaine use, unspecified, uncomplicated: Secondary | ICD-10-CM | POA: Diagnosis present

## 2016-09-07 DIAGNOSIS — Z880 Allergy status to penicillin: Secondary | ICD-10-CM

## 2016-09-07 DIAGNOSIS — Z8379 Family history of other diseases of the digestive system: Secondary | ICD-10-CM

## 2016-09-07 DIAGNOSIS — Z881 Allergy status to other antibiotic agents status: Secondary | ICD-10-CM

## 2016-09-07 DIAGNOSIS — Z833 Family history of diabetes mellitus: Secondary | ICD-10-CM

## 2016-09-07 DIAGNOSIS — Z590 Homelessness: Secondary | ICD-10-CM

## 2016-09-07 DIAGNOSIS — B182 Chronic viral hepatitis C: Secondary | ICD-10-CM | POA: Diagnosis present

## 2016-09-07 DIAGNOSIS — Z888 Allergy status to other drugs, medicaments and biological substances status: Secondary | ICD-10-CM

## 2016-09-07 DIAGNOSIS — N179 Acute kidney failure, unspecified: Secondary | ICD-10-CM | POA: Diagnosis present

## 2016-09-07 DIAGNOSIS — E872 Acidosis: Secondary | ICD-10-CM

## 2016-09-07 DIAGNOSIS — E871 Hypo-osmolality and hyponatremia: Secondary | ICD-10-CM | POA: Diagnosis present

## 2016-09-07 DIAGNOSIS — F1190 Opioid use, unspecified, uncomplicated: Secondary | ICD-10-CM | POA: Diagnosis present

## 2016-09-07 DIAGNOSIS — Z886 Allergy status to analgesic agent status: Secondary | ICD-10-CM

## 2016-09-07 DIAGNOSIS — E1110 Type 2 diabetes mellitus with ketoacidosis without coma: Secondary | ICD-10-CM | POA: Diagnosis present

## 2016-09-07 DIAGNOSIS — F17210 Nicotine dependence, cigarettes, uncomplicated: Secondary | ICD-10-CM | POA: Diagnosis present

## 2016-09-07 DIAGNOSIS — Z794 Long term (current) use of insulin: Secondary | ICD-10-CM

## 2016-09-07 DIAGNOSIS — I10 Essential (primary) hypertension: Secondary | ICD-10-CM | POA: Diagnosis present

## 2016-09-07 DIAGNOSIS — Z91030 Bee allergy status: Secondary | ICD-10-CM

## 2016-09-07 DIAGNOSIS — E8729 Other acidosis: Secondary | ICD-10-CM

## 2016-09-07 DIAGNOSIS — E860 Dehydration: Secondary | ICD-10-CM | POA: Diagnosis present

## 2016-09-07 LAB — CBC WITH DIFFERENTIAL/PLATELET
Basophils Absolute: 0 10*3/uL (ref 0–0.1)
Basophils Relative: 0 %
Eosinophils Absolute: 0.1 10*3/uL (ref 0–0.7)
Eosinophils Relative: 1 %
HCT: 44.4 % (ref 40.0–52.0)
Hemoglobin: 14.3 g/dL (ref 13.0–18.0)
Lymphocytes Relative: 20 %
Lymphs Abs: 2.1 10*3/uL (ref 1.0–3.6)
MCH: 28.4 pg (ref 26.0–34.0)
MCHC: 32.2 g/dL (ref 32.0–36.0)
MCV: 88.4 fL (ref 80.0–100.0)
Monocytes Absolute: 0.6 10*3/uL (ref 0.2–1.0)
Monocytes Relative: 6 %
Neutro Abs: 7.3 10*3/uL — ABNORMAL HIGH (ref 1.4–6.5)
Neutrophils Relative %: 73 %
Platelets: 298 10*3/uL (ref 150–440)
RBC: 5.03 MIL/uL (ref 4.40–5.90)
RDW: 13.3 % (ref 11.5–14.5)
WBC: 10.2 10*3/uL (ref 3.8–10.6)

## 2016-09-07 LAB — BASIC METABOLIC PANEL
Anion gap: 24 — ABNORMAL HIGH (ref 5–15)
BUN: 26 mg/dL — ABNORMAL HIGH (ref 6–20)
CO2: 15 mmol/L — ABNORMAL LOW (ref 22–32)
Calcium: 9.8 mg/dL (ref 8.9–10.3)
Chloride: 94 mmol/L — ABNORMAL LOW (ref 101–111)
Creatinine, Ser: 1.36 mg/dL — ABNORMAL HIGH (ref 0.61–1.24)
GFR calc Af Amer: >60 mL/min (ref >60)
GFR calc non Af Amer: >60 mL/min (ref >60)
Glucose, Bld: 580 mg/dL (ref 65–99)
Potassium: 4.8 mmol/L (ref 3.5–5.1)
Sodium: 133 mmol/L — ABNORMAL LOW (ref 135–145)

## 2016-09-07 LAB — GLUCOSE, CAPILLARY
Glucose-Capillary: 564 mg/dL (ref 65–99)
Glucose-Capillary: 439 mg/dL — ABNORMAL HIGH (ref 65–99)

## 2016-09-07 LAB — LACTIC ACID, PLASMA: Lactic Acid, Venous: 2.4 mmol/L (ref 0.5–1.9)

## 2016-09-07 MED ORDER — SODIUM CHLORIDE 0.9 % IV BOLUS (SEPSIS)
1000.0000 mL | Freq: Once | INTRAVENOUS | Status: AC
  Administered 2016-09-07: 1000 mL via INTRAVENOUS

## 2016-09-07 MED ORDER — SODIUM CHLORIDE 0.9 % IV SOLN
INTRAVENOUS | Status: DC
  Administered 2016-09-07: 3.8 [IU]/h via INTRAVENOUS
  Filled 2016-09-07: qty 2.5

## 2016-09-07 MED ORDER — SODIUM CHLORIDE 0.9 % IV SOLN
INTRAVENOUS | Status: DC
  Administered 2016-09-07: via INTRAVENOUS

## 2016-09-07 MED ORDER — ONDANSETRON HCL 4 MG/2ML IJ SOLN
INTRAMUSCULAR | Status: AC
  Administered 2016-09-07: 4 mg via INTRAVENOUS
  Filled 2016-09-07: qty 2

## 2016-09-07 MED ORDER — INSULIN ASPART 100 UNIT/ML ~~LOC~~ SOLN
10.0000 [IU] | Freq: Once | SUBCUTANEOUS | Status: AC
  Administered 2016-09-07: 10 [IU] via INTRAVENOUS
  Filled 2016-09-07: qty 10

## 2016-09-07 MED ORDER — ONDANSETRON HCL 4 MG/2ML IJ SOLN
4.0000 mg | Freq: Once | INTRAMUSCULAR | Status: AC
  Administered 2016-09-07: 4 mg via INTRAVENOUS

## 2016-09-07 NOTE — H&P (Signed)
History and Physical   [facility] - [place] [facility] Admission History and Physical [employer] Cilicia Brown, D.O.    Patient Name: Trevor Brown MR#: [id number] Date of Birth: 1991/02/26 Date of Admission: 09/07/2016  Referring MD/NP/PA: Dr. Roxan Hockeyobinson Primary Care Physician: No PCP Per Patient  Chief Complaint: Fatigue  HPI: Trevor Brown is a 26 y.o. male with a known history of Insulin-dependent DM, Hypertension, hepatitis C major depression, polysubstance abuse, homelessness  with multiple admissions for hyperglycemia, DKA, dehydration, came to the emergency department today via EMS. He states that he has all of his medications and his supplies and has been taking his medication as directed however in triage he reported that he had not taken his insulin since about 36 hours ago and had not eaten anything all day today. Of note patient states that he has had flulike symptoms including cough, congestion, fevers, chills however he has not sought medical attention and has not been tested for the flu.  He also complains of nausea but no vomiting, mild abdominal pain which is diffuse and chronic, neck and back pain since arrival in ED for which he is requesting pain meds.    Of note patient is known to have history of polysubstance abuse   Otherwise there has been no change in status.  ED Course: In the emergency department patient received 10 units of IV insulin followed by regular insulin infusion, Zofran, normal saline. Labs revealed DKA.  Review of Systems:  CONSTITUTIONAL: Positive fever/chills, fatigue, weakness, headache. EYES: No blurry or double vision. ENT: No tinnitus, postnasal drip, redness or soreness of the oropharynx. RESPIRATORY: Positive chest congestion with cough, negative dyspnea, wheeze.  No hemoptysis.  CARDIOVASCULAR: No chest pain, palpitations, syncope, orthopnea. No lower extremity edema.  GASTROINTESTINAL: Positive nausea, abdominal pain, negative vomiting,  diarrhea, constipation.  No hematemesis, melena or hematochezia. GENITOURINARY: No dysuria, frequency, hematuria. ENDOCRINE: No polyuria or nocturia. No heat or cold intolerance. HEMATOLOGY: No anemia, bruising, bleeding. INTEGUMENTARY: No rashes, ulcers, lesions. MUSCULOSKELETAL: No arthritis, gout, dyspnea. NEUROLOGIC: No numbness, tingling, ataxia, seizure-type activity, weakness. PSYCHIATRIC: No anxiety, depression, insomnia.   Past Medical History:  Diagnosis Date  . Diabetes mellitus without complication (HCC)   . Heart attack    Pt claims he had heart attack a year ago ( 2015)- and [facility], but not given any meds or angiogram, on review of chart- I could not find any details like that.  . Hepatitis C, acute may 2016  . Hypertension     History reviewed. No pertinent surgical history.   reports that he has been smoking Cigarettes.  He has been smoking about 2.00 packs per day. He has never used smokeless tobacco. He reports that he uses drugs, including IV. He reports that he does not drink alcohol.  Allergies  Allergen Reactions  . Bee Venom Anaphylaxis  . Penicillins Anaphylaxis, Hives and Other (See Comments)    Has patient had a PCN reaction causing immediate rash, facial/tongue/throat swelling, SOB or lightheadedness with hypotension: Yes Has patient had a PCN reaction causing severe rash involving mucus membranes or skin necrosis: No Has patient had a PCN reaction that required hospitalization No Has patient had a PCN reaction occurring within the last 10 years: Yes If all of the above answers are "NO", then may proceed with Cephalosporin use.  . Ibuprofen Other (See Comments)    Reaction:  GI upset   . Tramadol Hives and Swelling  . Vancomycin Rash and Other (See  Comments)    Reaction:  Red man's syndrome     Family History  Problem Relation Age of Onset  . Cirrhosis Mother   . Diabetes Mellitus II Maternal Grandmother     Prior to  Admission medications   Medication Sig Start Date End Date Taking? Authorizing Provider  insulin aspart (NOVOLOG) 100 UNIT/ML injection 0-9 Units, Subcutaneous, 3 times daily before meals & bedtime, First dose on Fri 03/15/16 at 2200 Correction coverage: Sensitive (thin, NPO, renal) CBG < 70: implement hypoglycemia protocol CBG 70 - 120: 0 units CBG 121 - 150: 1 unit CBG 151 - 200: 2 units CBG 201 - 250: 3 units CBG 251 - 300: 5 units CBG 301 - 350: 7 units CBG 351 - 400: 9 units 08/13/16  Yes Sital Mody, MD  insulin aspart protamine- aspart (NOVOLOG MIX 70/30) (70-30) 100 UNIT/ML injection Inject 0.3 mLs (30 Units total) into the skin 2 (two) times daily. 08/13/16 09/13/16 Yes Adrian Saran, MD    Physical Exam: Vitals:   09/07/16 2113 09/07/16 2114 09/07/16 2119  BP: 138/77    Pulse: (!) 103    Resp: 18    Temp: 98.1 F (36.7 C)    TempSrc: Oral    SpO2: 100%  100%  Weight:  59 kg (130 lb)   Height:  5\' 4"  (1.626 m)     GENERAL: 26 y.o.-year-old white male patient, ill-appearing, fatigued, answers questions with one word answers but does not offer any additional conversation.   HEENT: Head atraumatic, normocephalic. Pupils equal, round, reactive to light and accommodation. No scleral icterus. Extraocular muscles intact. Nares are patent. Oropharynx is clear. Mucus membranes Dry Very poor dentition with severe caries.  NECK: Supple, full range of motion. No JVD, no bruit heard. No thyroid enlargement, no tenderness, no cervical lymphadenopathy. CHEST: Normal breath sounds bilaterally. No wheezing, rales, rhonchi or crackles. No use of accessory muscles of respiration.  No reproducible chest wall tenderness.  CARDIOVASCULAR: S1, S2 normal. No murmurs, rubs, or gallops. Cap refill <2 seconds. ABDOMEN: Soft, nondistended. Mild tenderness diffusely.  No rebound, guarding, rigidity. Normoactive bowel sounds present in all four quadrants. No organomegaly or mass. EXTREMITIES: Full range of  motion. No pedal edema, cyanosis, or clubbing. NEUROLOGIC: Cranial nerves II through XII are grossly intact with no focal sensorimotor deficit. Muscle strength 5/5 in all extremities. Sensation intact. Gait not checked. PSYCHIATRIC: The patient is alert and oriented x 3.  SKIN: Warm, dry, and intact without obvious rash, lesion, or ulcer.   Labs on Admission:  CBC:  Recent Labs Lab 09/07/16 2133  WBC 10.2  NEUTROABS 7.3*  HGB 14.3  HCT 44.4  MCV 88.4  PLT 298   Basic Metabolic Panel:  Recent Labs Lab 09/07/16 2133  NA 133*  K 4.8  CL 94*  CO2 15*  GLUCOSE 580*  BUN 26*  CREATININE 1.36*  CALCIUM 9.8   GFR: Estimated Creatinine Clearance: 70.5 mL/min (by C-G formula based on SCr of 1.36 mg/dL (H)). Liver Function Tests: No results for input(s): AST, ALT, ALKPHOS, BILITOT, PROT, ALBUMIN in the last 168 hours. No results for input(s): LIPASE, AMYLASE in the last 168 hours. No results for input(s): AMMONIA in the last 168 hours. Coagulation Profile: No results for input(s): INR, PROTIME in the last 168 hours. Cardiac Enzymes: No results for input(s): CKTOTAL, CKMB, CKMBINDEX, TROPONINI in the last 168 hours. BNP (last 3 results) No results for input(s): PROBNP in the last 8760 hours. HbA1C: No results for input(s):  HGBA1C in the last 72 hours. CBG:  Recent Labs Lab 09/07/16 2143  GLUCAP 564*   Lipid Profile: No results for input(s): CHOL, HDL, LDLCALC, TRIG, CHOLHDL, LDLDIRECT in the last 72 hours. Thyroid Function Tests: No results for input(s): TSH, T4TOTAL, FREET4, T3FREE, THYROIDAB in the last 72 hours. Anemia Panel: No results for input(s): VITAMINB12, FOLATE, FERRITIN, TIBC, IRON, RETICCTPCT in the last 72 hours. Urine analysis:  Sepsis Labs: @LABRCNTIP (procalcitonin:4,lacticidven:4) )No results found for this or any previous visit (from the past 240 hour(s)).   Radiological Exams on Admission: No results found.  Assessment/Plan Active  Problems:   DKA (diabetic ketoacidoses) (HCC)    This is a 26 y.o. male with a history of Insulin-dependent DM, Hypertension, hepatitis C major depression, polysubstance abuse, homelessness now being admitted with:  1. DKA, currently on insulin drip -Admit to stepdown for continued intravenous insulin therapy per protocol -Aggressive IV fluid rehydration -Antiemetics, PPI and pain control -Labs every 4 hours -Nothing by mouth  2. Flulike illness -We'll check flu swab, blood, sputum and urine cultures  3. AKI likely secondary to dehydration -IV fluids and follow BMP  4. History of hypertension, not currently medicated -Monitor  5. History of chronic hepatitis C  Admission status: Inpatient, stepdown IV Fluids: Normal saline Diet/Nutrition: Nothing by mouth per protocol Consults called: Social work  DVT Px: SCDs and early ambulation. Code Status: Full Code  Disposition Plan: To be determined in 1-2 days   All the records are reviewed and case discussed with ED provider. Management plans discussed with the patient and/or family who express understanding and agree with plan of care.  Jatniel Verastegui D.O. on 09/07/2016 at 11:06 PM Between 7am to 6pm - Pager - [phone] After 6pm go to [email or web address] - Biomedical engineer [facility] Office [phone] CC: Primary care physician; No PCP Per Patient   09/07/2016, 11:06 PM

## 2016-09-07 NOTE — ED Triage Notes (Signed)
Pt presents to ED 02 via EMS; per EMS pt is homeless, pt is type 1 diabetic, last time he took his insulin was yesterday morning; pt has not eaten anything since noon today; per EMS VS were CBG 449, HR 120, BP 184/86; at this pt is resting on the bed, does not appear to be in acute distress; VS at this time BP 138/77, HR 110, O2 99% on RA; pt is calm and cooperative and alert and oriented x4

## 2016-09-07 NOTE — ED Provider Notes (Signed)
[facility] Emergency Department Provider Note    None    (approximate)  I have reviewed the triage vital signs and the nursing notes.   HISTORY  Chief Complaint Hyperglycemia    HPI Emeline Darlingony B Voit is a 26 y.o. male 1 and his facility with a history of diabetes that is insulin-dependent. Patient also with a long history of polysubstance abuse and homelessness presents today with generalized malaise elevated blood sugar and not taking his insulin since yesterday. Patient states she's not had anything to eat since noon today. Denies any vomiting but does feel nauseated. No measured fevers. 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   Past Medical History:  Diagnosis Date  . Diabetes mellitus without complication (HCC)   . Heart attack    Pt claims he had heart attack a year ago ( 2015)- and [facility], but not given any meds or angiogram, on review of chart- I could not find any details like that.  . Hepatitis C, acute may 2016  . Hypertension    Family History  Problem Relation Age of Onset  . Cirrhosis Mother   . Diabetes Mellitus II Maternal Grandmother    No past surgical history on file. Patient Active Problem List   Diagnosis Date Noted  . Acute renal insufficiency 08/12/2016  . 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 Dehydration 07/05/2016  . Headache 07/05/2016  . Mouth sores 07/05/2016  . Acute gastroenteritis 04/11/2016  . Hypotension 04/11/2016  . Hyponatremia 04/11/2016  . Leukocytosis 04/11/2016  . Elevated transaminase level 03/27/2016  . Diffuse abdominal pain 03/27/2016  . Anemia 03/27/2016  . Diabetes mellitus type 1 (HCC) 03/27/2016  . Tobacco abuse counseling 03/27/2016  . Transaminitis 03/25/2016  . Tobacco abuse 01/10/2016  . Type 1 diabetes mellitus with hyperglycemia (HCC) 12/12/2015  . Cocaine abuse 12/12/2015  . DKA (diabetic ketoacidoses) (HCC) 06/10/2015  . Hidradenitis suppurativa of left axilla   . DKA,  type 1 (HCC) 06/06/2015  . Malnutrition of moderate degree (HCC) 04/08/2015  . Hepatitis C 12/13/2014  . Diabetes type 1, uncontrolled (HCC) 12/11/2014  . Major depressive disorder, single episode, mild (HCC)   . Major depression, single episode 12/10/2014  . Diabetes mellitus type 1, uncontrolled (HCC) 12/09/2014  . Homelessness 12/09/2014      Prior to Admission medications   Medication Sig Start Date End Date Taking? Authorizing Provider  Chlorhexidine Gluconate Cloth 2 % PADS Apply 6 each topically daily at 6 (six) AM. 07/06/16   Katharina Caperima Vaickute, MD  insulin aspart (NOVOLOG) 100 UNIT/ML injection 0-9 Units, Subcutaneous, 3 times daily before meals & bedtime, First dose on Fri 03/15/16 at 2200 Correction coverage: Sensitive (thin, NPO, renal) CBG < 70: implement hypoglycemia protocol CBG 70 - 120: 0 units CBG 121 - 150: 1 unit CBG 151 - 200: 2 units CBG 201 - 250: 3 units CBG 251 - 300: 5 units CBG 301 - 350: 7 units CBG 351 - 400: 9 units 08/13/16   Sital Mody, MD  insulin aspart protamine- aspart (NOVOLOG MIX 70/30) (70-30) 100 UNIT/ML injection Inject 0.3 mLs (30 Units total) into the skin 2 (two) times daily. 08/13/16 09/13/16  Adrian SaranSital Mody, MD  magic mouthwash SOLN Take 10 mLs by mouth 4 (four) times daily. 07/05/16   Katharina Caperima Vaickute, MD  mupirocin ointment (BACTROBAN) 2 % Place 1 application into the nose 2 (two) times daily. 07/05/16   Katharina Caperima Vaickute, MD    Allergies Bee venom; Penicillins; Ibuprofen;  Tramadol; and Vancomycin    Social History Social History  Substance Use Topics  . Smoking status: Current Every Day Smoker    Packs/day: 2.00    Types: Cigarettes  . Smokeless tobacco: Never Used  . Alcohol use No    Review of Systems Patient denies headaches, rhinorrhea, blurry vision, numbness, shortness of breath, chest pain, edema, cough, abdominal pain, nausea, vomiting, diarrhea, dysuria, fevers, rashes or hallucinations unless otherwise stated above in  HPI. ____________________________________________   PHYSICAL EXAM:  VITAL SIGNS: Vitals:   09/07/16 2113  BP: 138/77  Pulse: (!) 103  Resp: 18  Temp: 98.1 F (36.7 C)    Constitutional: Drowsy and critically ill appearing Eyes: Conjunctivae are normal. PERRL. EOMI. Head: Atraumatic. Nose: No congestion/rhinnorhea. Mouth/Throat: Mucous membranes are dry. Poor dentition.  Oropharynx non-erythematous. Neck: No stridor. Painless ROM. No cervical spine tenderness to palpation Hematological/Lymphatic/Immunilogical: No cervical lymphadenopathy. Cardiovascular: Tachycardic regular rhythm. Grossly normal heart sounds.  Slow cap refill Respiratory: Breathing  Gastrointestinal: Soft and nontender. No distention. No abdominal bruits. No CVA tenderness. Genitourinary:  Musculoskeletal: No lower extremity tenderness nor edema.  No joint effusions. Neurologic:  Normal speech and language. Moving all extremities spontaneously Skin:  No rash noted.  Track marks noted to bilateral upper extremities.   ____________________________________________   LABS (all labs ordered are listed, but only abnormal results are displayed)  Results for orders placed or performed during the hospital encounter of 09/07/16 (from the past 24 hour(s))  Blood gas, venous ([facility], [facility], [facility])     Status: Abnormal (Preliminary result)   Collection Time: 09/07/16  9:18 PM  Result Value Ref Range   FIO2 0.21    pH, Ven 7.22 (L) 7.250 - 7.430   pCO2, Ven 39 (L) 44.0 - 60.0 mmHg   pO2, Ven PENDING 32.0 - 45.0 mmHg   Bicarbonate 16.0 (L) 20.0 - 28.0 mmol/L   Acid-base deficit 11.1 (H) 0.0 - 2.0 mmol/L   Patient temperature 37.0    Collection site VEIN    Sample type VENOUS   Basic metabolic panel     Status: Abnormal   Collection Time: 09/07/16  9:33 PM  Result Value Ref Range   Sodium 133 (L) 135 - 145 mmol/L   Potassium 4.8 3.5 - 5.1 mmol/L   Chloride 94 (L) 101 - 111 mmol/L   CO2 15 (L) 22 - 32 mmol/L    Glucose, Bld 580 (HH) 65 - 99 mg/dL   BUN 26 (H) 6 - 20 mg/dL   Creatinine, Ser 1.61 (H) 0.61 - 1.24 mg/dL   Calcium 9.8 8.9 - 09.6 mg/dL   GFR calc non Af Amer >60 >60 mL/min   GFR calc Af Amer >60 >60 mL/min   Anion gap 24 (H) 5 - 15  CBC with Differential (PNL)     Status: Abnormal   Collection Time: 09/07/16  9:33 PM  Result Value Ref Range   WBC 10.2 3.8 - 10.6 K/uL   RBC 5.03 4.40 - 5.90 MIL/uL   Hemoglobin 14.3 13.0 - 18.0 g/dL   HCT 04.5 40.9 - 81.1 %   MCV 88.4 80.0 - 100.0 fL   MCH 28.4 26.0 - 34.0 pg   MCHC 32.2 32.0 - 36.0 g/dL   RDW 91.4 78.2 - 95.6 %   Platelets 298 150 - 440 K/uL   Neutrophils Relative % 73 %   Neutro Abs 7.3 (H) 1.4 - 6.5 K/uL   Lymphocytes Relative 20 %   Lymphs Abs 2.1 1.0 -  3.6 K/uL   Monocytes Relative 6 %   Monocytes Absolute 0.6 0.2 - 1.0 K/uL   Eosinophils Relative 1 %   Eosinophils Absolute 0.1 0 - 0.7 K/uL   Basophils Relative 0 %   Basophils Absolute 0.0 0 - 0.1 K/uL  Glucose, capillary     Status: Abnormal   Collection Time: 09/07/16  9:43 PM  Result Value Ref Range   Glucose-Capillary 564 (HH) 65 - 99 mg/dL   ____________________________________________  EKG My review and personal interpretation at Time: 23:02   Indication: hyperglycemia  Rate: 100  Rhythm: sinus Axis: normal Other: normal intervals, no STEMI ____________________________________________  RADIOLOGY   ____________________________________________   PROCEDURES  Procedure(s) performed:  Procedures    Critical Care performed: yes CRITICAL CARE Performed by: Willy Eddy   Total critical care time: 50 minutes  Critical care time was exclusive of separately billable procedures and treating other patients.  Critical care was necessary to treat or prevent imminent or life-threatening deterioration.  Critical care was time spent personally by me on the following activities: development of treatment plan with patient and/or surrogate as well as  nursing, discussions with consultants, evaluation of patient's response to treatment, examination of patient, obtaining history from patient or surrogate, ordering and performing treatments and interventions, ordering and review of laboratory studies, ordering and review of radiographic studies, pulse oximetry and re-evaluation of patient's condition.  ____________________________________________   INITIAL IMPRESSION / ASSESSMENT AND PLAN / ED COURSE  Pertinent labs & imaging results that were available during my care of the patient were reviewed by me and considered in my medical decision making (see chart for details).  DDX: dka. Dehydration, sepsis, overdose  MARJORIE LUSSIER is a 26 y.o. who presents to the ED with history of diabetes presents critically ill with altered mental status with drowsiness as well as elevated glucose and recent heroin use. Patient afebrile but tachycardic employee perfused. Does appear markedly dehydrated. Based on his presentation I am concerned for DKA. The patient will be placed on continuous pulse oximetry and telemetry for monitoring.  Laboratory evaluation will be sent to evaluate for the above complaints.       ----------------------------------------- 11:05 PM on 09/07/2016 -----------------------------------------  Patient improving hemodynamically. Has marketed acute high anion gap at about acidosis consistent with diabetic ketoacidosis. Providing aggressive IV fluid resuscitation in addition and some bolus as his potassium is currently normal and he has no EKG changes. We'll continue insulin drip. Patient will require admission to hospital for further evaluation and management.  Have discussed with the patient and available family all diagnostics and treatments performed thus far and all questions were answered to the best of my ability. The patient demonstrates understanding and agreement with  plan.   ____________________________________________   FINAL CLINICAL IMPRESSION(S) / ED DIAGNOSES  Final diagnoses:  Diabetic ketoacidosis without coma associated with type 1 diabetes mellitus (HCC)  Dehydration  High anion gap metabolic acidosis      NEW MEDICATIONS STARTED DURING THIS VISIT:  New Prescriptions   No medications on file     Note:  This document was prepared using Dragon voice recognition software and may include unintentional dictation errors.    Willy Eddy, MD 09/07/16 [phone]

## 2016-09-08 LAB — URINE DRUG SCREEN, QUALITATIVE (ARMC ONLY)
Amphetamines, Ur Screen: NOT DETECTED
Barbiturates, Ur Screen: NOT DETECTED
Benzodiazepine, Ur Scrn: NOT DETECTED
Cannabinoid 50 Ng, Ur ~~LOC~~: NOT DETECTED
Cocaine Metabolite,Ur ~~LOC~~: POSITIVE — AB
MDMA (Ecstasy)Ur Screen: NOT DETECTED
Methadone Scn, Ur: NOT DETECTED
Opiate, Ur Screen: POSITIVE — AB
Phencyclidine (PCP) Ur S: NOT DETECTED
Tricyclic, Ur Screen: NOT DETECTED

## 2016-09-08 LAB — BASIC METABOLIC PANEL
Anion gap: 11 (ref 5–15)
Anion gap: 10 (ref 5–15)
BUN: 22 mg/dL — ABNORMAL HIGH (ref 6–20)
BUN: 19 mg/dL (ref 6–20)
CO2: 18 mmol/L — ABNORMAL LOW (ref 22–32)
CO2: 21 mmol/L — ABNORMAL LOW (ref 22–32)
Calcium: 8.5 mg/dL — ABNORMAL LOW (ref 8.9–10.3)
Calcium: 8.5 mg/dL — ABNORMAL LOW (ref 8.9–10.3)
Chloride: 111 mmol/L (ref 101–111)
Chloride: 108 mmol/L (ref 101–111)
Creatinine, Ser: 1.1 mg/dL (ref 0.61–1.24)
Creatinine, Ser: 0.85 mg/dL (ref 0.61–1.24)
GFR calc Af Amer: >60 mL/min (ref >60)
GFR calc Af Amer: >60 mL/min (ref >60)
GFR calc non Af Amer: >60 mL/min (ref >60)
GFR calc non Af Amer: >60 mL/min (ref >60)
Glucose, Bld: 237 mg/dL — ABNORMAL HIGH (ref 65–99)
Glucose, Bld: 173 mg/dL — ABNORMAL HIGH (ref 65–99)
Potassium: 4.4 mmol/L (ref 3.5–5.1)
Potassium: 4.3 mmol/L (ref 3.5–5.1)
Sodium: 140 mmol/L (ref 135–145)
Sodium: 139 mmol/L (ref 135–145)

## 2016-09-08 LAB — GLUCOSE, CAPILLARY
Glucose-Capillary: 139 mg/dL — ABNORMAL HIGH (ref 65–99)
Glucose-Capillary: 151 mg/dL — ABNORMAL HIGH (ref 65–99)
Glucose-Capillary: 169 mg/dL — ABNORMAL HIGH (ref 65–99)
Glucose-Capillary: 185 mg/dL — ABNORMAL HIGH (ref 65–99)
Glucose-Capillary: 188 mg/dL — ABNORMAL HIGH (ref 65–99)
Glucose-Capillary: 211 mg/dL — ABNORMAL HIGH (ref 65–99)
Glucose-Capillary: 238 mg/dL — ABNORMAL HIGH (ref 65–99)
Glucose-Capillary: 239 mg/dL — ABNORMAL HIGH (ref 65–99)
Glucose-Capillary: 326 mg/dL — ABNORMAL HIGH (ref 65–99)

## 2016-09-08 LAB — URINALYSIS, COMPLETE (UACMP) WITH MICROSCOPIC
Bacteria, UA: NONE SEEN
Bilirubin Urine: NEGATIVE
Glucose, UA: >=500 mg/dL — AB
Hgb urine dipstick: NEGATIVE
Ketones, ur: 80 mg/dL — AB
Leukocytes, UA: NEGATIVE
Nitrite: NEGATIVE
Protein, ur: NEGATIVE mg/dL
RBC / HPF: NONE SEEN RBC/hpf (ref 0–5)
Specific Gravity, Urine: 1.024 (ref 1.005–1.030)
Squamous Epithelial / LPF: NONE SEEN
pH: 5 (ref 5.0–8.0)

## 2016-09-08 LAB — INFLUENZA PANEL BY PCR (TYPE A & B)
Influenza A By PCR: NEGATIVE
Influenza B By PCR: NEGATIVE

## 2016-09-08 LAB — MAGNESIUM: Magnesium: 1.8 mg/dL (ref 1.7–2.4)

## 2016-09-08 LAB — MRSA PCR SCREENING: MRSA by PCR: NEGATIVE

## 2016-09-08 LAB — PHOSPHORUS: Phosphorus: 3.6 mg/dL (ref 2.5–4.6)

## 2016-09-08 LAB — BETA-HYDROXYBUTYRIC ACID: Beta-Hydroxybutyric Acid: 1.17 mmol/L — ABNORMAL HIGH (ref 0.05–0.27)

## 2016-09-08 MED ORDER — INSULIN GLARGINE 100 UNIT/ML ~~LOC~~ SOLN
10.0000 [IU] | Freq: Two times a day (BID) | SUBCUTANEOUS | Status: DC
  Filled 2016-09-08 (×2): qty 0.1

## 2016-09-08 MED ORDER — INSULIN ASPART PROT & ASPART (70-30 MIX) 100 UNIT/ML ~~LOC~~ SUSP: 30.0000 [IU] | Freq: Two times a day (BID) | SUBCUTANEOUS | 0 refills | Status: DC

## 2016-09-08 MED ORDER — ACETAMINOPHEN 325 MG PO TABS
650.0000 mg | ORAL_TABLET | Freq: Four times a day (QID) | ORAL | Status: DC | PRN
  Administered 2016-09-08: 650 mg via ORAL
  Filled 2016-09-08: qty 2

## 2016-09-08 MED ORDER — GUAIFENESIN ER 600 MG PO TB12: 600.0000 mg | ORAL_TABLET | Freq: Two times a day (BID) | ORAL | Status: DC

## 2016-09-08 MED ORDER — SODIUM CHLORIDE 0.9 % IV SOLN
INTRAVENOUS | Status: DC
  Administered 2016-09-08: 07:00:00 via INTRAVENOUS

## 2016-09-08 MED ORDER — DM-GUAIFENESIN ER 30-600 MG PO TB12: 1.0000 | ORAL_TABLET | Freq: Two times a day (BID) | ORAL | Status: DC

## 2016-09-08 MED ORDER — INSULIN ASPART 100 UNIT/ML ~~LOC~~ SOLN: SUBCUTANEOUS | 0 refills | Status: DC

## 2016-09-08 MED ORDER — SODIUM CHLORIDE 0.9 % IV SOLN
INTRAVENOUS | Status: DC
  Administered 2016-09-08: 09:00:00 via INTRAVENOUS

## 2016-09-08 MED ORDER — PANTOPRAZOLE SODIUM 40 MG IV SOLR
40.0000 mg | INTRAVENOUS | Status: DC
  Administered 2016-09-08: 40 mg via INTRAVENOUS
  Filled 2016-09-08: qty 40

## 2016-09-08 MED ORDER — DEXTROMETHORPHAN POLISTIREX ER 30 MG/5ML PO SUER
30.0000 mg | Freq: Two times a day (BID) | ORAL | Status: DC
  Filled 2016-09-08 (×2): qty 5

## 2016-09-08 MED ORDER — SODIUM CHLORIDE 0.9 % IV SOLN
30.0000 meq | Freq: Once | INTRAVENOUS | Status: AC
  Administered 2016-09-08: 30 meq via INTRAVENOUS
  Filled 2016-09-08: qty 15

## 2016-09-08 MED ORDER — PANTOPRAZOLE SODIUM 40 MG PO TBEC: 40.0000 mg | DELAYED_RELEASE_TABLET | Freq: Every day | ORAL | Status: DC

## 2016-09-08 MED ORDER — DEXTROSE-NACL 5-0.45 % IV SOLN
INTRAVENOUS | Status: DC
  Administered 2016-09-08: 02:00:00 via INTRAVENOUS

## 2016-09-08 MED ORDER — INSULIN GLARGINE 100 UNIT/ML ~~LOC~~ SOLN
15.0000 [IU] | Freq: Every day | SUBCUTANEOUS | Status: DC
  Administered 2016-09-08: 15 [IU] via SUBCUTANEOUS
  Filled 2016-09-08: qty 0.15

## 2016-09-08 MED ORDER — MORPHINE SULFATE (PF) 4 MG/ML IV SOLN
1.0000 mg | INTRAVENOUS | Status: DC | PRN
  Administered 2016-09-08: 1 mg via INTRAVENOUS
  Filled 2016-09-08: qty 1

## 2016-09-08 MED ORDER — INSULIN ASPART 100 UNIT/ML ~~LOC~~ SOLN: 0.0000 [IU] | Freq: Every day | SUBCUTANEOUS | Status: DC

## 2016-09-08 MED ORDER — INSULIN ASPART 100 UNIT/ML ~~LOC~~ SOLN
4.0000 [IU] | Freq: Three times a day (TID) | SUBCUTANEOUS | Status: DC
  Administered 2016-09-08 (×2): 4 [IU] via SUBCUTANEOUS
  Filled 2016-09-08: qty 4

## 2016-09-08 MED ORDER — INSULIN ASPART 100 UNIT/ML ~~LOC~~ SOLN
0.0000 [IU] | Freq: Three times a day (TID) | SUBCUTANEOUS | Status: DC
Start: 2016-09-08 — End: 2016-09-08
  Administered 2016-09-08: 5 [IU] via SUBCUTANEOUS
  Administered 2016-09-08: 2 [IU] via SUBCUTANEOUS
  Filled 2016-09-08: qty 2
  Filled 2016-09-08: qty 5

## 2016-09-08 MED ORDER — SODIUM CHLORIDE 0.9 % IV SOLN
INTRAVENOUS | Status: DC
  Administered 2016-09-08: 04:00:00 via INTRAVENOUS

## 2016-09-08 NOTE — Progress Notes (Signed)
Pt has been seen by case management and social work for repeated admissions for DKA. Glucometer given per Larita FifeLynn, RN-case management. Per Lynn-give pt 25 insulin syringes and ETOH swabs. Dr Renae GlossWieting made aware.

## 2016-09-08 NOTE — Progress Notes (Signed)
Carb Modified diet ordered-pt currently eating at this time. Insulin gtt HELD at 0725 d/t cbg of 139. Sliding scale ordered and initiated per cbg of 139 -2U given in addition to 4U ordered with meals. Insulin gtt stopped at 0834, as well as D5-0.45 NS. NS continued at 75.

## 2016-09-08 NOTE — Care Management Note (Addendum)
Case Management Note  Patient Details  Name: Emeline Darlingony B Rockefeller MRN: [id number] Date of Birth: January 15, 1991  Subjective/Objective:      Mr Ervin Knackony Novoa and his Father were in ICU 20 when this writer attempted to discuss why the previous hospital discharge plan failed. Father walked out of the room. Alinda Moneyony responded that he had not gone to the [facility] because "I  did not fill out the application", and that [facility] had not [facility]. He stated that he does not currently have any of the previous 3 charity glucometers provided to him by [facility] "because I move around a lot and lost them." Mr [name] reports that he is currently homeless and [facility]. Mr [name] was provided with charity glucometer number 4 today, and his ICU nurse was asked to provide him with insulin syringes and ETOH swabs. Mr [name] was provided with a  MATCH coupon and he verbalized understanding of the explanation of how to use the coupon and the list of pharmacies to [place] the [place] and [place]. This Clinical research associatewriter asked the weekend CSW if she would speak to Mr [name] about his non-compliance with outpatient treatment and any possible community resources. See her note. The CSW noted track marks all over Mr Louie CasaBailey's arms and he admitted to her that he is using IV drugs, but because he is an insulin dependent diabetic he is being supplied with SQ insulin syringes today. Mr [name] had a flat affect and responded minimally to this writer but did make some eye contact.              Action/Plan:   Expected Discharge Date:  09/08/16               Expected Discharge Plan:     In-House Referral:     Discharge planning Services     Post Acute Care Choice:    Choice offered to:     DME Arranged:    DME Agency:     HH Arranged:    HH Agency:     Status of Service:     If discussed at [employer] Meetings, dates discussed:    Additional  Comments:  Trinette Vera A, RN 09/08/2016, 2:14 PM

## 2016-09-08 NOTE — Progress Notes (Signed)
CONCERNING: IV to Oral Route Change Policy  RECOMMENDATION: This patient is receiving pantoprazole by the intravenous route.  Based on criteria approved by the Pharmacy and Therapeutics Committee, the intravenous medication(s) is/are being converted to the equivalent oral dose form(s).   DESCRIPTION: These criteria include:  The patient is eating (either orally or via tube) and/or has been taking other orally administered medications for a least 24 hours  The patient has no evidence of active gastrointestinal bleeding or impaired GI absorption (gastrectomy, short bowel, patient on TNA or NPO).  If you have questions about this conversion, please contact the Pharmacy Department  []   [phone]( [phone] )  [facility] [x]   [phone]( [phone] )  [facility] []   [phone]( [phone] )  [facility] []   [phone]( [phone] )  [facility] []   [phone]( [phone] )  [facility]   Yadir Zentner L, [facility] 09/08/2016 2:06 PM

## 2016-09-08 NOTE — Progress Notes (Signed)
Pt has been been provided with a taxi voucher to the destination of his request.

## 2016-09-08 NOTE — Progress Notes (Signed)
Dr Tobi BastosPyreddy notified of pt's lab results, insulin gtt rate, and latest CBG.  MD states he will order the insulin gtt to be discontinued and start phase 2 of the DKA protocol.

## 2016-09-08 NOTE — Clinical Social Work Note (Signed)
CSW met with patient to discuss polysubstance use, homelessness, diabetes management, and community resources. The patient indicates that he has been taking suboxone given to him by a "friend". The CSW questioned if this friend could lend the patient a phone to contact DSS for resources. The patient indicated that he would ask. The patient reports that he does not want to continue living on the streets, but that "the shelter told [him] to leave because of how long [he] had been there." This CSW suspects the patient was asked to leave due to substance use as homeless shelters rarely have limitations on time if the resident is following shelter policies. The patient indicated that he is unable to keep up with glucometers due to moving around. The CSW asked if he could keep his diabetes supplies with a friend, such as the one who gave him suboxone. The patient did not answer. The CSW advised the patient of the myriad complications that would result if he does not adhere to a diabetes management regimen. The patient was able to verbalize the consequences, and he said that his biggest barrier is transportation. The CSW advised the patient that if he contacts DSS, they can assist with Medicaid, getting him transport to and from medical appointments, help with substance use, help with his dental issues, and help with mental health. The patient verbalized understanding. If the patient returns, a psych consult is recommended.  Santiago Bumpers, MSW, Latanya Presser [phone]

## 2016-09-08 NOTE — Discharge Summary (Signed)
[employer] - [place] at [facility]   PATIENT NAME: Trevor Brown    MR#:  [id number]  DATE OF BIRTH:  February 16, 1991  DATE OF ADMISSION:  09/07/2016 ADMITTING PHYSICIAN: Tonye Royalty, DO  DATE OF DISCHARGE: 09/08/2016  PRIMARY CARE PHYSICIAN: [facility]    ADMISSION DIAGNOSIS:  Dehydration [E86.0] High anion gap metabolic acidosis [E87.2] Diabetic ketoacidosis without coma associated with type 1 diabetes mellitus (HCC) [E10.10]  DISCHARGE DIAGNOSIS:  Active Problems:   DKA (diabetic ketoacidoses) (HCC)   SECONDARY DIAGNOSIS:   Past Medical History:  Diagnosis Date  . Diabetes mellitus without complication (HCC)   . Heart attack    Pt claims he had heart attack a year ago ( 2015)- and was admitted in [facility] for that, but not given any meds or angiogram, on review of chart- I could not find any details like that.  . Hepatitis C, acute may 2016  . Hypertension     HOSPITAL COURSE:   1. Diabetic ketoacidosis secondary to running out of medications. The patient was initially started on insulin drip and flipped over to Lantus insulin. Because of cost will go back on his 70/30 insulin and sliding scale.  Care manager saw the patient about polysubstance abuse and homelessness and not taking his medications. Prescriptions written. In speaking with the nursing staff in the ICU, they know this patient from frequent admissions. 2. Polysubstance abuse with snorting cocaine and opiates in the urine toxicology. I advised to use his money on insulin rather than substances. 3. Poor dentition needs to get to the [facility] dental clinic because all the roots of his teeth need to be removed. 4. Hepatitis C 5. Acute kidney injury resolved with IV fluid hydration. 6. Hyponatremia improved with IV fluid hydration and controlling sugars.   DISCHARGE CONDITIONS:   Guarded secondary to poor compliance  CONSULTS OBTAINED:   none  DRUG ALLERGIES:   Allergies  Allergen Reactions   . Bee Venom Anaphylaxis  . Penicillins Anaphylaxis, Hives and Other (See Comments)    Has patient had a PCN reaction causing immediate rash, facial/tongue/throat swelling, SOB or lightheadedness with hypotension: Yes Has patient had a PCN reaction causing severe rash involving mucus membranes or skin necrosis: No Has patient had a PCN reaction that required hospitalization No Has patient had a PCN reaction occurring within the last 10 years: Yes If all of the above answers are "NO", then may proceed with Cephalosporin use.  . Ibuprofen Other (See Comments)    Reaction:  GI upset   . Tramadol Hives and Swelling  . Vancomycin Rash and Other (See Comments)    Reaction:  Red man's syndrome     DISCHARGE MEDICATIONS:   Current Discharge Medication List    CONTINUE these medications which have CHANGED   Details  insulin aspart (NOVOLOG) 100 UNIT/ML injection 0-9 Units, Subcutaneous, 3 times daily before meals & bedtime, First dose on Fri 03/15/16 at 2200 Correction coverage: Sensitive (thin, NPO, renal) CBG < 70: implement hypoglycemia protocol CBG 70 - 120: 0 units CBG 121 - 150: 1 unit CBG 151 - 200: 2 units CBG 201 - 250: 3 units CBG 251 - 300: 5 units CBG 301 - 350: 7 units CBG 351 - 400: 9 units Qty: 10 mL, Refills: 0    insulin aspart protamine- aspart (NOVOLOG MIX 70/30) (70-30) 100 UNIT/ML injection Inject 0.3 mLs (30 Units total) into the skin 2 (two) times daily. Qty: 20 mL, Refills: 0  DISCHARGE INSTRUCTIONS:   [facility]  If you experience worsening of your admission symptoms, develop shortness of breath, life threatening emergency, suicidal or homicidal thoughts you must seek medical attention immediately by calling 911 or calling your MD immediately  if symptoms less severe.  You Must read complete instructions/literature along with all the possible adverse reactions/side effects for all the Medicines you take and that have  been prescribed to you. Take any new Medicines after you have completely understood and accept all the possible adverse reactions/side effects.   Please note  You were cared for by a hospitalist during your hospital stay. If you have any questions about your discharge medications or the care you received while you were in the hospital after you are discharged, you can call the unit and asked to speak with the hospitalist on call if the hospitalist that took care of you is not available. Once you are discharged, your primary care physician will handle any further medical issues. Please note that NO REFILLS for any discharge medications will be authorized once you are discharged, as it is imperative that you return to your primary care physician (or establish a relationship with a primary care physician if you do not have one) for your aftercare needs so that they can reassess your need for medications and monitor your lab values.    Today   CHIEF COMPLAINT:   Chief Complaint  Patient presents with  . Hyperglycemia    HISTORY OF PRESENT ILLNESS:  Trevor Brown  is a 26 y.o. male with a known history of diabetes with noncompliance presents with hyperglycemia and diabetic ketoacidosis   VITAL SIGNS:  Blood pressure 122/63, pulse 77, temperature 98 F (36.7 C), temperature source Oral, resp. rate 18, height 5\' 4"  (1.626 m), weight 57.2 kg (126 lb 1.7 oz), SpO2 99 %.  I/O:   Intake/Output Summary (Last 24 hours) at 09/08/16 1502 Last data filed at 09/08/16 1339  Gross per 24 hour  Intake          5648.06 ml  Output             1775 ml  Net          3873.06 ml    PHYSICAL EXAMINATION:  GENERAL:  26 y.o.-year-old patient lying in the bed with no acute distress.  EYES: Pupils equal, round, reactive to light and accommodation. No scleral icterus. Extraocular muscles intact.  HEENT: Head atraumatic, normocephalic. Oropharynx and nasopharynx clear.  NECK:  Supple, no jugular venous  distention. No thyroid enlargement, no tenderness.  LUNGS: Normal breath sounds bilaterally, no wheezing, rales,rhonchi or crepitation. No use of accessory muscles of respiration.  CARDIOVASCULAR: S1, S2 normal. No murmurs, rubs, or gallops.  ABDOMEN: Soft, non-tender, non-distended. Bowel sounds present. No organomegaly or mass.  EXTREMITIES: No pedal edema, cyanosis, or clubbing.  NEUROLOGIC: Cranial nerves II through XII are intact. Muscle strength 5/5 in all extremities. Sensation intact. Gait not checked.  PSYCHIATRIC: The patient is alert and oriented x 3.  SKIN: No obvious rash, lesion, or ulcer.   DATA REVIEW:   CBC  Recent Labs Lab 09/07/16 2133  WBC 10.2  HGB 14.3  HCT 44.4  PLT 298    Chemistries   Recent Labs Lab 09/08/16 0457  NA 139  K 4.3  CL 108  CO2 21*  GLUCOSE 173*  BUN 19  CREATININE 0.85  CALCIUM 8.5*  MG 1.8    Microbiology Results  Results for  orders placed or performed during the hospital encounter of 09/07/16  MRSA PCR Screening     Status: None   Collection Time: 09/08/16  4:34 AM  Result Value Ref Range Status   MRSA by PCR NEGATIVE NEGATIVE Final    Comment:        The GeneXpert MRSA Assay (FDA approved for NASAL specimens only), is one component of a comprehensive MRSA colonization surveillance program. It is not intended to diagnose MRSA infection nor to guide or monitor treatment for MRSA infections.      Management plans discussed with the patient, And he is in agreement.  CODE STATUS:     Code Status Orders        Start     Ordered   09/08/16 0353  Full code  Continuous     09/08/16 0352    Code Status History    Date Active Date Inactive Code Status Order ID Comments User Context   08/12/2016 10:20 PM 08/13/2016  5:10 PM Full Code [id number]  Katharina Caper, MD Inpatient   07/04/2016 12:24 PM 07/04/2016 12:24 PM Full Code [id number]  Altamese Dilling, MD Inpatient   07/04/2016 12:24 PM 07/05/2016  7:36 PM  Full Code [id number]  Altamese Dilling, MD Inpatient   04/09/2016  4:54 PM 04/11/2016  2:09 PM Full Code [id number]  Katharina Caper, MD Inpatient   03/26/2016  1:18 AM 03/27/2016  7:11 PM Full Code [id number]  Tonye Royalty, DO Inpatient   03/18/2016  8:15 AM 03/21/2016  8:35 PM Full Code [id number]  Wyatt Haste, MD ED   03/12/2016 12:20 PM 03/16/2016  7:27 PM Full Code [id number]  Enedina Finner, MD Inpatient   02/18/2016  4:59 PM 02/19/2016  5:18 PM Full Code [id number]  Ramonita Lab, MD ED   01/12/2016  8:58 AM 01/15/2016  7:33 PM Full Code [id number]  Milagros Loll, MD ED   01/11/2016 12:35 AM 01/11/2016 11:20 PM Full Code [id number]  Gery Pray, MD Inpatient   12/12/2015 10:04 PM 12/14/2015  6:38 PM Full Code [id number]  Katharina Caper, MD Inpatient   10/23/2015  5:42 PM 10/25/2015  7:04 PM Full Code [id number]  Katha Hamming, MD ED   06/10/2015  8:20 AM 06/12/2015  2:41 PM Full Code [id number]  Arnaldo Natal, MD Inpatient   06/06/2015  8:43 PM 06/07/2015  4:20 PM Full Code [id number]  Enedina Finner, MD Inpatient   04/07/2015 12:45 PM 04/08/2015  8:37 PM Full Code [id number]  Altamese Dilling, MD Inpatient   12/09/2014  2:44 PM 12/13/2014  4:16 PM Full Code [id number]  Gale Journey, MD Inpatient      TOTAL TIME TAKING CARE OF THIS PATIENT: 35 minutes.    Alford Highland M.D on 09/08/2016 at 3:02 PM  Between 7am to 6pm - Pager - [phone]  After 6pm go to [email or web address] - Social research officer, government  [employer] Office  [phone]  CC: Primary care physician; [facility]

## 2016-09-09 LAB — HEMOGLOBIN A1C
Hgb A1c MFr Bld: 10.2 % — ABNORMAL HIGH (ref 4.8–5.6)
Mean Plasma Glucose: 246 mg/dL

## 2016-09-09 LAB — HIV ANTIBODY (ROUTINE TESTING W REFLEX): HIV Screen 4th Generation wRfx: NONREACTIVE

## 2016-09-10 LAB — BLOOD GAS, VENOUS
Acid-base deficit: 11.1 mmol/L — ABNORMAL HIGH (ref 0.0–2.0)
Bicarbonate: 16 mmol/L — ABNORMAL LOW (ref 20.0–28.0)
FIO2: 0.21
Patient temperature: 37
pCO2, Ven: 39 mmHg — ABNORMAL LOW (ref 44.0–60.0)
pH, Ven: 7.22 — ABNORMAL LOW (ref 7.250–7.430)

## 2016-10-12 ENCOUNTER — Emergency Department
Admission: EM | Admit: 2016-10-12 | Discharge: 2016-10-12 | Disposition: A | Discharge status: Home / Self Care | Payer: Self-pay | Attending: Emergency Medicine | Admitting: Emergency Medicine

## 2016-10-12 DIAGNOSIS — F1010 Alcohol abuse, uncomplicated: Secondary | ICD-10-CM | POA: Insufficient documentation

## 2016-10-12 DIAGNOSIS — E1010 Type 1 diabetes mellitus with ketoacidosis without coma: Secondary | ICD-10-CM | POA: Insufficient documentation

## 2016-10-12 DIAGNOSIS — E1065 Type 1 diabetes mellitus with hyperglycemia: Secondary | ICD-10-CM | POA: Insufficient documentation

## 2016-10-12 DIAGNOSIS — Z5181 Encounter for therapeutic drug level monitoring: Secondary | ICD-10-CM | POA: Insufficient documentation

## 2016-10-12 DIAGNOSIS — Z794 Long term (current) use of insulin: Secondary | ICD-10-CM | POA: Insufficient documentation

## 2016-10-12 DIAGNOSIS — B029 Zoster without complications: Secondary | ICD-10-CM | POA: Insufficient documentation

## 2016-10-12 DIAGNOSIS — F17210 Nicotine dependence, cigarettes, uncomplicated: Secondary | ICD-10-CM | POA: Insufficient documentation

## 2016-10-12 DIAGNOSIS — F1410 Cocaine abuse, uncomplicated: Secondary | ICD-10-CM | POA: Insufficient documentation

## 2016-10-12 DIAGNOSIS — Z91199 Patient's noncompliance with other medical treatment and regimen due to unspecified reason: Secondary | ICD-10-CM

## 2016-10-12 DIAGNOSIS — Z9119 Patient's noncompliance with other medical treatment and regimen: Secondary | ICD-10-CM

## 2016-10-12 DIAGNOSIS — R739 Hyperglycemia, unspecified: Secondary | ICD-10-CM

## 2016-10-12 LAB — GLUCOSE, CAPILLARY
Glucose-Capillary: 392 mg/dL — ABNORMAL HIGH (ref 65–99)
Glucose-Capillary: 216 mg/dL — ABNORMAL HIGH (ref 65–99)
Glucose-Capillary: 102 mg/dL — ABNORMAL HIGH (ref 65–99)

## 2016-10-12 LAB — URINE DRUG SCREEN, QUALITATIVE (ARMC ONLY)
Amphetamines, Ur Screen: NOT DETECTED
Barbiturates, Ur Screen: NOT DETECTED
Benzodiazepine, Ur Scrn: NOT DETECTED
Cannabinoid 50 Ng, Ur ~~LOC~~: POSITIVE — AB
Cocaine Metabolite,Ur ~~LOC~~: POSITIVE — AB
MDMA (Ecstasy)Ur Screen: NOT DETECTED
Methadone Scn, Ur: NOT DETECTED
Opiate, Ur Screen: NOT DETECTED
Phencyclidine (PCP) Ur S: NOT DETECTED
Tricyclic, Ur Screen: NOT DETECTED

## 2016-10-12 LAB — URINALYSIS, COMPLETE (UACMP) WITH MICROSCOPIC
Bacteria, UA: NONE SEEN
Bilirubin Urine: NEGATIVE
Glucose, UA: >=500 mg/dL — AB
Hgb urine dipstick: NEGATIVE
Ketones, ur: 80 mg/dL — AB
Leukocytes, UA: NEGATIVE
Nitrite: NEGATIVE
Protein, ur: NEGATIVE mg/dL
RBC / HPF: NONE SEEN RBC/hpf (ref 0–5)
Specific Gravity, Urine: 1.025 (ref 1.005–1.030)
Squamous Epithelial / LPF: NONE SEEN
WBC, UA: NONE SEEN WBC/hpf (ref 0–5)
pH: 5 (ref 5.0–8.0)

## 2016-10-12 LAB — BASIC METABOLIC PANEL
Anion gap: 19 — ABNORMAL HIGH (ref 5–15)
Anion gap: 12 (ref 5–15)
BUN: 20 mg/dL (ref 6–20)
BUN: 18 mg/dL (ref 6–20)
CO2: 18 mmol/L — ABNORMAL LOW (ref 22–32)
CO2: 18 mmol/L — ABNORMAL LOW (ref 22–32)
Calcium: 9.6 mg/dL (ref 8.9–10.3)
Calcium: 8.7 mg/dL — ABNORMAL LOW (ref 8.9–10.3)
Chloride: 99 mmol/L — ABNORMAL LOW (ref 101–111)
Chloride: 110 mmol/L (ref 101–111)
Creatinine, Ser: 0.89 mg/dL (ref 0.61–1.24)
Creatinine, Ser: 0.82 mg/dL (ref 0.61–1.24)
GFR calc Af Amer: >60 mL/min (ref >60)
GFR calc Af Amer: >60 mL/min (ref >60)
GFR calc non Af Amer: >60 mL/min (ref >60)
GFR calc non Af Amer: >60 mL/min (ref >60)
Glucose, Bld: 391 mg/dL — ABNORMAL HIGH (ref 65–99)
Glucose, Bld: 196 mg/dL — ABNORMAL HIGH (ref 65–99)
Potassium: 4.8 mmol/L (ref 3.5–5.1)
Potassium: 4.5 mmol/L (ref 3.5–5.1)
Sodium: 136 mmol/L (ref 135–145)
Sodium: 140 mmol/L (ref 135–145)

## 2016-10-12 LAB — CBC
HCT: 45.5 % (ref 40.0–52.0)
Hemoglobin: 15.2 g/dL (ref 13.0–18.0)
MCH: 28.6 pg (ref 26.0–34.0)
MCHC: 33.5 g/dL (ref 32.0–36.0)
MCV: 85.4 fL (ref 80.0–100.0)
Platelets: 253 10*3/uL (ref 150–440)
RBC: 5.33 MIL/uL (ref 4.40–5.90)
RDW: 14.9 % — ABNORMAL HIGH (ref 11.5–14.5)
WBC: 10 10*3/uL (ref 3.8–10.6)

## 2016-10-12 LAB — BLOOD GAS, VENOUS
Acid-base deficit: 5.9 mmol/L — ABNORMAL HIGH (ref 0.0–2.0)
Bicarbonate: 19.6 mmol/L — ABNORMAL LOW (ref 20.0–28.0)
FIO2: 0.21
O2 Saturation: 61.4 %
Patient temperature: 37
pCO2, Ven: 38 mmHg — ABNORMAL LOW (ref 44.0–60.0)
pH, Ven: 7.32 (ref 7.250–7.430)
pO2, Ven: 35 mmHg (ref 32.0–45.0)

## 2016-10-12 LAB — ETHANOL: Alcohol, Ethyl (B): <5 mg/dL (ref <5)

## 2016-10-12 MED ORDER — INSULIN ASPART 100 UNIT/ML ~~LOC~~ SOLN
10.0000 [IU] | Freq: Once | SUBCUTANEOUS | Status: AC
  Administered 2016-10-12: 10 [IU] via SUBCUTANEOUS
  Filled 2016-10-12: qty 10

## 2016-10-12 MED ORDER — SODIUM CHLORIDE 0.9 % IV BOLUS (SEPSIS)
1000.0000 mL | Freq: Once | INTRAVENOUS | Status: AC
  Administered 2016-10-12: 1000 mL via INTRAVENOUS

## 2016-10-12 MED ORDER — ONDANSETRON HCL 4 MG/2ML IJ SOLN
4.0000 mg | Freq: Once | INTRAMUSCULAR | Status: AC
  Administered 2016-10-12: 4 mg via INTRAVENOUS
  Filled 2016-10-12: qty 2

## 2016-10-12 MED ORDER — INSULIN ASPART 100 UNIT/ML ~~LOC~~ SOLN: SUBCUTANEOUS | 0 refills | Status: DC

## 2016-10-12 MED ORDER — ACYCLOVIR 400 MG PO TABS: 400.0000 mg | ORAL_TABLET | Freq: Every day | ORAL | 0 refills | Status: DC

## 2016-10-12 MED ORDER — INSULIN ASPART PROT & ASPART (70-30 MIX) 100 UNIT/ML ~~LOC~~ SUSP: 30.0000 [IU] | Freq: Two times a day (BID) | SUBCUTANEOUS | 0 refills | Status: DC

## 2016-10-12 NOTE — ED Provider Notes (Addendum)
[facility] Emergency Department Provider Note  ____________________________________________   I have reviewed the triage vital signs and the nursing notes.   HISTORY  Chief Complaint Hyperglycemia    HPI Trevor Brown is a 26 y.o. male unknown to [place] for multiple prior visits for EtOH abuse cocaine abuse and DKA presents today after drinking beer and wine last night using cocaine and not taking his insulin. Patient states that he couldn't put his hands on it this morning he started vomiting. He denies any fever or chills. Denies any abdominal pain at this time. States that her throat. Patient's poor dentition at baseline and he states that he would like pain medication for his chronic dental pain. This is not a new complaint. The patient denies any hematemesis. Denies fever.    Past Medical History:  Diagnosis Date  . Diabetes mellitus without complication (HCC)   . Heart attack    Pt claims he had heart attack a year ago ( 2015)- and was admitted in [facility] for that, but not given any meds or angiogram, on review of chart- I could not find any details like that.  . Hepatitis C, acute may 2016  . Hypertension     Patient Active Problem List   Diagnosis Date Noted  . Acute renal insufficiency 08/12/2016  . 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 Dehydration 07/05/2016  . Headache 07/05/2016  . Mouth sores 07/05/2016  . Acute gastroenteritis 04/11/2016  . Hypotension 04/11/2016  . Hyponatremia 04/11/2016  . Leukocytosis 04/11/2016  . Elevated transaminase level 03/27/2016  . Diffuse abdominal pain 03/27/2016  . Anemia 03/27/2016  . Diabetes mellitus type 1 (HCC) 03/27/2016  . Tobacco abuse counseling 03/27/2016  . Transaminitis 03/25/2016  . Tobacco abuse 01/10/2016  . Type 1 diabetes mellitus with hyperglycemia (HCC) 12/12/2015  . Cocaine abuse 12/12/2015  . DKA (diabetic ketoacidoses) (HCC) 06/10/2015  . Hidradenitis suppurativa of left axilla   . DKA, type 1  (HCC) 06/06/2015  . Malnutrition of moderate degree (HCC) 04/08/2015  . Hepatitis C 12/13/2014  . Diabetes type 1, uncontrolled (HCC) 12/11/2014  . Major depressive disorder, single episode, mild (HCC)   . Major depression, single episode 12/10/2014  . Diabetes mellitus type 1, uncontrolled (HCC) 12/09/2014  . Homelessness 12/09/2014    History reviewed. No pertinent surgical history.  Prior to Admission medications   Medication Sig Start Date End Date Taking? Authorizing Provider  insulin aspart (NOVOLOG) 100 UNIT/ML injection 0-9 Units, Subcutaneous, 3 times daily before meals & bedtime, First dose on Fri 03/15/16 at 2200 Correction coverage: Sensitive (thin, NPO, renal) CBG < 70: implement hypoglycemia protocol CBG 70 - 120: 0 units CBG 121 - 150: 1 unit CBG 151 - 200: 2 units CBG 201 - 250: 3 units CBG 251 - 300: 5 units CBG 301 - 350: 7 units CBG 351 - 400: 9 units 09/08/16  Yes Alford Highland, MD  insulin aspart protamine- aspart (NOVOLOG MIX 70/30) (70-30) 100 UNIT/ML injection Inject 0.3 mLs (30 Units total) into the skin 2 (two) times daily. 09/08/16 10/12/17 Yes Alford Highland, MD    Allergies Bee venom; Penicillins; Ibuprofen; Tramadol; and Vancomycin  Family History  Problem Relation Age of Onset  . Cirrhosis Mother   . Diabetes Mellitus II Maternal Grandmother     Social History Social History  Substance Use Topics  . Smoking status: Current Every Day Smoker    Packs/day: 2.00    Types: Cigarettes  . Smokeless tobacco: Never Used  . Alcohol  use No    Review of Systems Constitutional: No fever/chills Eyes: No visual changes. ENT: No sore throat. No stiff neck no neck pain Cardiovascular: Denies chest pain. Respiratory: Denies shortness of breath. Gastrointestinal:   Positive vomiting.  No diarrhea.  No constipation. Genitourinary: Negative for dysuria. Musculoskeletal: Negative lower extremity swelling Skin: Negative for rash. Neurological: Negative  for severe headaches, focal weakness or numbness. 10-point ROS otherwise negative.  ____________________________________________   PHYSICAL EXAM:  VITAL SIGNS: ED Triage Vitals  Enc Vitals Group     BP 10/12/16 0847 122/73     Pulse Rate 10/12/16 0847 93     Resp 10/12/16 0847 16     Temp 10/12/16 0847 98.4 F (36.9 C)     Temp Source 10/12/16 0847 Oral     SpO2 10/12/16 0847 97 %     Weight 10/12/16 0850 120 lb (54.4 kg)     Height 10/12/16 0850 5\' 4"  (1.626 m)     Head Circumference --      Peak Flow --      Pain Score 10/12/16 0851 10     Pain Loc --      Pain Edu? --      Excl. in GC? --     Constitutional: Alert and oriented. Well appearing and in no acute distress. Eyes: Conjunctivae are normal. PERRL. EOMI. Head: Atraumatic. Nose: No congestion/rhinnorhea. Mouth/Throat: Mucous membranes are Slightly dry.  Oropharynx non-erythematous. Neck: No stridor.   Nontender with no meningismus Cardiovascular: Normal rate, regular rhythm. Grossly normal heart sounds.  Good peripheral circulation. Respiratory: Normal respiratory effort.  No retractions. Lungs CTAB. Abdominal: Soft and nontender. No distention. No guarding no rebound Back:  There is no focal tenderness or step off.  there is no midline tenderness there are no lesions noted. there is no CVA tenderness Musculoskeletal: No lower extremity tenderness, no upper extremity tenderness. No joint effusions, no DVT signs strong distal pulses no edema Neurologic:  Normal speech and language. No gross focal neurologic deficits are appreciated.  Skin:  Skin is warm, dry and intact. No rash noted. Psychiatric: Mood and affect are normal. Speech and behavior are normal.  ____________________________________________   LABS (all labs ordered are listed, but only abnormal results are displayed)  Labs Reviewed  GLUCOSE, CAPILLARY - Abnormal; Notable for the following:       Result Value   Glucose-Capillary 392 (*)    All  other components within normal limits  BASIC METABOLIC PANEL - Abnormal; Notable for the following:    Chloride 99 (*)    CO2 18 (*)    Glucose, Bld 391 (*)    Anion gap 19 (*)    All other components within normal limits  CBC - Abnormal; Notable for the following:    RDW 14.9 (*)    All other components within normal limits  BLOOD GAS, VENOUS - Abnormal; Notable for the following:    pCO2, Ven 38 (*)    Bicarbonate 19.6 (*)    Acid-base deficit 5.9 (*)    All other components within normal limits  GLUCOSE, CAPILLARY - Abnormal; Notable for the following:    Glucose-Capillary 216 (*)    All other components within normal limits  BASIC METABOLIC PANEL - Abnormal; Notable for the following:    CO2 18 (*)    Glucose, Bld 196 (*)    Calcium 8.7 (*)    All other components within normal limits  ETHANOL  URINALYSIS, COMPLETE (UACMP) WITH MICROSCOPIC  URINE DRUG SCREEN, QUALITATIVE ([facility] ONLY)  CBG MONITORING, ED   ____________________________________________  EKG  I personally interpreted any EKGs ordered by me or triage  ____________________________________________  RADIOLOGY  I reviewed any imaging ordered by me or triage that were performed during my shift and, if possible, patient and/or family made aware of any abnormal findings. ____________________________________________   PROCEDURES  Procedure(s) performed: None  Procedures  Critical Care performed: None  ____________________________________________   INITIAL IMPRESSION / ASSESSMENT AND PLAN / ED COURSE  Pertinent labs & imaging results that were available during my care of the patient were reviewed by me and considered in my medical decision making (see chart for details).  Patient here with nausea vomiting and cocaine abuse and elevated blood sugar. Does have a normal pH was slightly high anion gap. We gave him insulin and IV fluids and that is completely close. Sugars down to 119. Patient is in no acute  distress. Given that his anion gap is closed what may have been a very mild early DKA is now resolved and we will ensure by mouth and try to advise discharge. Excessively counseled him about his habits and he is not interested in any change. Does not wish outpatient counseling. We will continue to observe him in the emergency room pending by mouth. Patient very well known to [place] and personally seen him twice in the last 4 months for similar and other doctors of seen him as well. He has had extensive outpatient counseling made available to him and extensive resources made available for him and he does not live with follow-up. As usual, patient states he is completely out of all of his insulin and he wants me to fill his prescription again.   ----------------------------------------- 3:14 PM on 10/12/2016 -----------------------------------------  Patient on discharged is bring my attention the fact that for the last 10 days he has had a rash on his bottom. We did look at it. There are a small cluster of 5 or 6 scabbed over small 3 mm round lesions on the buttocks and a another similar patch down the back of the leg. This is most likely presents a resolving shingles infection. I will not give him steroids we will treat him with acyclovir although it is nearly cleared up. I don't think that's what brought on his DKA today I think it is from a binge of cocaine and alcohol and marijuana. We will advise close outpatient follow-up.    ____________________________________________   FINAL CLINICAL IMPRESSION(S) / ED DIAGNOSES  Final diagnoses:  None      This chart was dictated using voice recognition software.  Despite best efforts to proofread,  errors can occur which can change meaning.      Jeanmarie Plant, MD 10/12/16 1232    Jeanmarie Plant, MD 10/12/16 [phone]

## 2016-10-12 NOTE — ED Triage Notes (Signed)
Pt presents via EMS c/o hyperglycemia, abd pain, and mouth pain. Pt reports not taking insulin.

## 2016-10-12 NOTE — Discharge Instructions (Signed)
Stop using cocaine stop drinking alcohol use your insulin as prescribed and return to the emergency room for new or worrisome symptoms. Follow closely as an outpatient for her substance abuse and for continued management of your diabetes.

## 2016-10-13 ENCOUNTER — Inpatient Hospital Stay
Admission: EM | Admit: 2016-10-13 | Discharge: 2016-10-15 | DRG: 638 | Disposition: A | Discharge status: Home / Self Care | Payer: Self-pay | Attending: Internal Medicine | Admitting: Internal Medicine

## 2016-10-13 ENCOUNTER — Encounter: Payer: Self-pay | Admitting: Emergency Medicine

## 2016-10-13 DIAGNOSIS — Z886 Allergy status to analgesic agent status: Secondary | ICD-10-CM

## 2016-10-13 DIAGNOSIS — I10 Essential (primary) hypertension: Secondary | ICD-10-CM | POA: Diagnosis present

## 2016-10-13 DIAGNOSIS — Z8619 Personal history of other infectious and parasitic diseases: Secondary | ICD-10-CM

## 2016-10-13 DIAGNOSIS — Z881 Allergy status to other antibiotic agents status: Secondary | ICD-10-CM

## 2016-10-13 DIAGNOSIS — Z888 Allergy status to other drugs, medicaments and biological substances status: Secondary | ICD-10-CM

## 2016-10-13 DIAGNOSIS — Z9119 Patient's noncompliance with other medical treatment and regimen: Secondary | ICD-10-CM

## 2016-10-13 DIAGNOSIS — Z880 Allergy status to penicillin: Secondary | ICD-10-CM

## 2016-10-13 DIAGNOSIS — F17210 Nicotine dependence, cigarettes, uncomplicated: Secondary | ICD-10-CM | POA: Diagnosis present

## 2016-10-13 DIAGNOSIS — F1410 Cocaine abuse, uncomplicated: Secondary | ICD-10-CM | POA: Diagnosis present

## 2016-10-13 DIAGNOSIS — Z794 Long term (current) use of insulin: Secondary | ICD-10-CM

## 2016-10-13 DIAGNOSIS — E1010 Type 1 diabetes mellitus with ketoacidosis without coma: Principal | ICD-10-CM | POA: Diagnosis present

## 2016-10-13 DIAGNOSIS — Z91030 Bee allergy status: Secondary | ICD-10-CM

## 2016-10-13 DIAGNOSIS — E871 Hypo-osmolality and hyponatremia: Secondary | ICD-10-CM | POA: Diagnosis present

## 2016-10-13 DIAGNOSIS — E1065 Type 1 diabetes mellitus with hyperglycemia: Secondary | ICD-10-CM

## 2016-10-13 DIAGNOSIS — Z833 Family history of diabetes mellitus: Secondary | ICD-10-CM

## 2016-10-13 LAB — CBC
HCT: 46.6 % (ref 40.0–52.0)
Hemoglobin: 14.8 g/dL (ref 13.0–18.0)
MCH: 28.1 pg (ref 26.0–34.0)
MCHC: 31.7 g/dL — ABNORMAL LOW (ref 32.0–36.0)
MCV: 88.5 fL (ref 80.0–100.0)
Platelets: 250 10*3/uL (ref 150–440)
RBC: 5.27 MIL/uL (ref 4.40–5.90)
RDW: 15.6 % — ABNORMAL HIGH (ref 11.5–14.5)
WBC: 12.5 10*3/uL — ABNORMAL HIGH (ref 3.8–10.6)

## 2016-10-13 LAB — BASIC METABOLIC PANEL
Anion gap: 24 — ABNORMAL HIGH (ref 5–15)
Anion gap: 11 (ref 5–15)
Anion gap: 10 (ref 5–15)
Anion gap: 12 (ref 5–15)
Anion gap: 9 (ref 5–15)
BUN: 17 mg/dL (ref 6–20)
BUN: 13 mg/dL (ref 6–20)
BUN: 9 mg/dL (ref 6–20)
BUN: 9 mg/dL (ref 6–20)
BUN: 9 mg/dL (ref 6–20)
CO2: 9 mmol/L — ABNORMAL LOW (ref 22–32)
CO2: 12 mmol/L — ABNORMAL LOW (ref 22–32)
CO2: 16 mmol/L — ABNORMAL LOW (ref 22–32)
CO2: 15 mmol/L — ABNORMAL LOW (ref 22–32)
CO2: 18 mmol/L — ABNORMAL LOW (ref 22–32)
Calcium: 9.5 mg/dL (ref 8.9–10.3)
Calcium: 8.3 mg/dL — ABNORMAL LOW (ref 8.9–10.3)
Calcium: 8.7 mg/dL — ABNORMAL LOW (ref 8.9–10.3)
Calcium: 8.4 mg/dL — ABNORMAL LOW (ref 8.9–10.3)
Calcium: 8.4 mg/dL — ABNORMAL LOW (ref 8.9–10.3)
Chloride: 97 mmol/L — ABNORMAL LOW (ref 101–111)
Chloride: 111 mmol/L (ref 101–111)
Chloride: 109 mmol/L (ref 101–111)
Chloride: 105 mmol/L (ref 101–111)
Chloride: 104 mmol/L (ref 101–111)
Creatinine, Ser: 1.24 mg/dL (ref 0.61–1.24)
Creatinine, Ser: 0.98 mg/dL (ref 0.61–1.24)
Creatinine, Ser: 0.85 mg/dL (ref 0.61–1.24)
Creatinine, Ser: 0.75 mg/dL (ref 0.61–1.24)
Creatinine, Ser: 0.77 mg/dL (ref 0.61–1.24)
GFR calc Af Amer: >60 mL/min (ref >60)
GFR calc Af Amer: >60 mL/min (ref >60)
GFR calc Af Amer: >60 mL/min (ref >60)
GFR calc Af Amer: >60 mL/min (ref >60)
GFR calc Af Amer: >60 mL/min (ref >60)
GFR calc non Af Amer: >60 mL/min (ref >60)
GFR calc non Af Amer: >60 mL/min (ref >60)
GFR calc non Af Amer: >60 mL/min (ref >60)
GFR calc non Af Amer: >60 mL/min (ref >60)
GFR calc non Af Amer: >60 mL/min (ref >60)
Glucose, Bld: 566 mg/dL (ref 65–99)
Glucose, Bld: 213 mg/dL — ABNORMAL HIGH (ref 65–99)
Glucose, Bld: 141 mg/dL — ABNORMAL HIGH (ref 65–99)
Glucose, Bld: 183 mg/dL — ABNORMAL HIGH (ref 65–99)
Glucose, Bld: 166 mg/dL — ABNORMAL HIGH (ref 65–99)
Potassium: 4.6 mmol/L (ref 3.5–5.1)
Potassium: 4.4 mmol/L (ref 3.5–5.1)
Potassium: 3.6 mmol/L (ref 3.5–5.1)
Potassium: 3.8 mmol/L (ref 3.5–5.1)
Potassium: 3.6 mmol/L (ref 3.5–5.1)
Sodium: 130 mmol/L — ABNORMAL LOW (ref 135–145)
Sodium: 134 mmol/L — ABNORMAL LOW (ref 135–145)
Sodium: 135 mmol/L (ref 135–145)
Sodium: 132 mmol/L — ABNORMAL LOW (ref 135–145)
Sodium: 131 mmol/L — ABNORMAL LOW (ref 135–145)

## 2016-10-13 LAB — URINALYSIS, COMPLETE (UACMP) WITH MICROSCOPIC
Bacteria, UA: NONE SEEN
Bilirubin Urine: NEGATIVE
Glucose, UA: >=500 mg/dL — AB
Hgb urine dipstick: NEGATIVE
Ketones, ur: 80 mg/dL — AB
Leukocytes, UA: NEGATIVE
Nitrite: NEGATIVE
Protein, ur: NEGATIVE mg/dL
RBC / HPF: NONE SEEN RBC/hpf (ref 0–5)
Specific Gravity, Urine: 1.025 (ref 1.005–1.030)
Squamous Epithelial / LPF: NONE SEEN
WBC, UA: NONE SEEN WBC/hpf (ref 0–5)
pH: 5 (ref 5.0–8.0)

## 2016-10-13 LAB — GLUCOSE, CAPILLARY
Glucose-Capillary: 582 mg/dL (ref 65–99)
Glucose-Capillary: 534 mg/dL (ref 65–99)
Glucose-Capillary: 437 mg/dL — ABNORMAL HIGH (ref 65–99)
Glucose-Capillary: 291 mg/dL — ABNORMAL HIGH (ref 65–99)
Glucose-Capillary: 218 mg/dL — ABNORMAL HIGH (ref 65–99)
Glucose-Capillary: 169 mg/dL — ABNORMAL HIGH (ref 65–99)
Glucose-Capillary: 138 mg/dL — ABNORMAL HIGH (ref 65–99)
Glucose-Capillary: 227 mg/dL — ABNORMAL HIGH (ref 65–99)
Glucose-Capillary: 243 mg/dL — ABNORMAL HIGH (ref 65–99)
Glucose-Capillary: 337 mg/dL — ABNORMAL HIGH (ref 65–99)
Glucose-Capillary: 102 mg/dL — ABNORMAL HIGH (ref 65–99)
Glucose-Capillary: 125 mg/dL — ABNORMAL HIGH (ref 65–99)
Glucose-Capillary: 94 mg/dL (ref 65–99)
Glucose-Capillary: 156 mg/dL — ABNORMAL HIGH (ref 65–99)
Glucose-Capillary: 182 mg/dL — ABNORMAL HIGH (ref 65–99)
Glucose-Capillary: 172 mg/dL — ABNORMAL HIGH (ref 65–99)
Glucose-Capillary: 162 mg/dL — ABNORMAL HIGH (ref 65–99)
Glucose-Capillary: 170 mg/dL — ABNORMAL HIGH (ref 65–99)
Glucose-Capillary: 145 mg/dL — ABNORMAL HIGH (ref 65–99)
Glucose-Capillary: 160 mg/dL — ABNORMAL HIGH (ref 65–99)

## 2016-10-13 LAB — HEPATIC FUNCTION PANEL
ALT: 167 U/L — ABNORMAL HIGH (ref 17–63)
AST: 114 U/L — ABNORMAL HIGH (ref 15–41)
Albumin: 4.6 g/dL (ref 3.5–5.0)
Alkaline Phosphatase: 207 U/L — ABNORMAL HIGH (ref 38–126)
Bilirubin, Direct: 0.2 mg/dL (ref 0.1–0.5)
Indirect Bilirubin: 1.9 mg/dL — ABNORMAL HIGH (ref 0.3–0.9)
Total Bilirubin: 2.1 mg/dL — ABNORMAL HIGH (ref 0.3–1.2)
Total Protein: 8.4 g/dL — ABNORMAL HIGH (ref 6.5–8.1)

## 2016-10-13 LAB — BLOOD GAS, VENOUS
FIO2: 0.21
Patient temperature: 37
pCO2, Ven: 19 mmHg — CL (ref 44.0–60.0)
pH, Ven: 7.21 — ABNORMAL LOW (ref 7.250–7.430)
pO2, Ven: 74 mmHg — ABNORMAL HIGH (ref 32.0–45.0)

## 2016-10-13 LAB — BETA-HYDROXYBUTYRIC ACID: Beta-Hydroxybutyric Acid: >8 mmol/L — ABNORMAL HIGH (ref 0.05–0.27)

## 2016-10-13 LAB — LIPASE, BLOOD: Lipase: <10 U/L — ABNORMAL LOW (ref 11–51)

## 2016-10-13 LAB — ETHANOL: Alcohol, Ethyl (B): <5 mg/dL (ref <5)

## 2016-10-13 LAB — TROPONIN I: Troponin I: <0.03 ng/mL (ref <0.03)

## 2016-10-13 LAB — MRSA PCR SCREENING: MRSA by PCR: POSITIVE — AB

## 2016-10-13 MED ORDER — DEXTROSE-NACL 5-0.45 % IV SOLN
INTRAVENOUS | Status: DC
  Administered 2016-10-13: 04:00:00 via INTRAVENOUS

## 2016-10-13 MED ORDER — SODIUM CHLORIDE 0.9 % IV BOLUS (SEPSIS): 1000.0000 mL | Freq: Once | INTRAVENOUS | Status: DC

## 2016-10-13 MED ORDER — ACYCLOVIR 200 MG PO CAPS
400.0000 mg | ORAL_CAPSULE | Freq: Every day | ORAL | Status: DC
  Administered 2016-10-13 – 2016-10-15 (×12): 400 mg via ORAL
  Filled 2016-10-13 (×13): qty 2
  Filled 2016-10-13: qty 1
  Filled 2016-10-13 (×3): qty 2

## 2016-10-13 MED ORDER — SODIUM CHLORIDE 0.9 % IV SOLN: INTRAVENOUS | Status: DC

## 2016-10-13 MED ORDER — FAMOTIDINE IN NACL 20-0.9 MG/50ML-% IV SOLN
20.0000 mg | Freq: Once | INTRAVENOUS | Status: AC
  Administered 2016-10-13: 20 mg via INTRAVENOUS

## 2016-10-13 MED ORDER — MORPHINE SULFATE (PF) 4 MG/ML IV SOLN
4.0000 mg | INTRAVENOUS | Status: DC | PRN
  Administered 2016-10-13 – 2016-10-15 (×14): 4 mg via INTRAVENOUS
  Filled 2016-10-13 (×14): qty 1

## 2016-10-13 MED ORDER — ONDANSETRON HCL 4 MG/2ML IJ SOLN
4.0000 mg | Freq: Once | INTRAMUSCULAR | Status: AC
  Administered 2016-10-13: 4 mg via INTRAVENOUS

## 2016-10-13 MED ORDER — SODIUM CHLORIDE 0.9 % IV BOLUS (SEPSIS)
1000.0000 mL | Freq: Once | INTRAVENOUS | Status: AC
  Administered 2016-10-13: 1000 mL via INTRAVENOUS

## 2016-10-13 MED ORDER — ONDANSETRON HCL 4 MG/2ML IJ SOLN
INTRAMUSCULAR | Status: AC
  Filled 2016-10-13: qty 2

## 2016-10-13 MED ORDER — SODIUM CHLORIDE 0.9 % IV SOLN
INTRAVENOUS | Status: DC
  Administered 2016-10-13: 05:00:00 via INTRAVENOUS

## 2016-10-13 MED ORDER — DEXTROSE-NACL 5-0.45 % IV SOLN
INTRAVENOUS | Status: DC
  Administered 2016-10-13: 16:00:00 via INTRAVENOUS

## 2016-10-13 MED ORDER — SODIUM CHLORIDE 0.9 % IV SOLN: INTRAVENOUS | Status: AC

## 2016-10-13 MED ORDER — SODIUM CHLORIDE 0.9 % IV SOLN
30.0000 meq | Freq: Once | INTRAVENOUS | Status: AC
  Administered 2016-10-13: 30 meq via INTRAVENOUS
  Filled 2016-10-13: qty 15

## 2016-10-13 MED ORDER — ONDANSETRON HCL 4 MG/2ML IJ SOLN: 4.0000 mg | Freq: Once | INTRAMUSCULAR | Status: DC

## 2016-10-13 MED ORDER — ENOXAPARIN SODIUM 40 MG/0.4ML ~~LOC~~ SOLN
40.0000 mg | SUBCUTANEOUS | Status: DC
  Administered 2016-10-13: 40 mg via SUBCUTANEOUS
  Filled 2016-10-13 (×2): qty 0.4

## 2016-10-13 MED ORDER — SODIUM CHLORIDE 0.9 % IV SOLN
INTRAVENOUS | Status: DC
  Administered 2016-10-13: 4.7 [IU]/h via INTRAVENOUS
  Filled 2016-10-13: qty 2.5

## 2016-10-13 NOTE — ED Notes (Signed)
Verified rate/dose change with Piedad Climesavid W, RN.

## 2016-10-13 NOTE — H&P (Signed)
Trevor Brown is an 26 y.o. male.   Chief Complaint: Hyperglycemia HPI: The patient with diabetes type 1 presents emergency department complaining of abdominal pain, nausea and vomiting and hyperglycemia. The patient was just seen in the emergency department yesterday for hyperglycemia and shingles. He has been unable to obtain his insulin and presents again today with laboratory values consistent with either ketoacidosis. The patient denies bloody or bilious emesis. He states that his head hurts and that his mouth is painful due to poor dentition. IV insulin as well as intravenous fluid was started in the emergency department the patient was admitted to the ICU by the hospitalist service for further management.  Past Medical History:  Diagnosis Date  . Diabetes mellitus without complication (HCC)   . Heart attack    Pt claims he had heart attack a year ago ( 2015)- and was admitted in [facility] for that, but not given any meds or angiogram, on review of chart- I could not find any details like that.  . Hepatitis C, acute may 2016  . Hypertension     History reviewed. No pertinent surgical history. none  Family History  Problem Relation Age of Onset  . Cirrhosis Mother   . Diabetes Mellitus II Maternal Grandmother    Social History:  reports that he has been smoking Cigarettes.  He has been smoking about 2.00 packs per day. He has never used smokeless tobacco. He reports that he uses drugs, including IV. He reports that he does not drink alcohol.  Allergies:  Allergies  Allergen Reactions  . Bee Venom Anaphylaxis  . Penicillins Anaphylaxis, Hives and Other (See Comments)    Has patient had a PCN reaction causing immediate rash, facial/tongue/throat swelling, SOB or lightheadedness with hypotension: Yes Has patient had a PCN reaction causing severe rash involving mucus membranes or skin necrosis: No Has patient had a PCN reaction that required hospitalization No Has patient had a PCN reaction  occurring within the last 10 years: Yes If all of the above answers are "NO", then may proceed with Cephalosporin use.  . Ibuprofen Other (See Comments)    Reaction:  GI upset   . Tramadol Hives and Swelling  . Vancomycin Rash and Other (See Comments)    Reaction:  Red man's syndrome     Medications Prior to Admission  Medication Sig Dispense Refill  . acyclovir (ZOVIRAX) 400 MG tablet Take 1 tablet (400 mg total) by mouth 5 (five) times daily. 35 tablet 0  . insulin aspart (NOVOLOG) 100 UNIT/ML injection 0-9 Units, Subcutaneous, 3 times daily before meals & bedtime, First dose on Fri 03/15/16 at 2200 Correction coverage: Sensitive (thin, NPO, renal) CBG < 70: implement hypoglycemia protocol CBG 70 - 120: 0 units CBG 121 - 150: 1 unit CBG 151 - 200: 2 units CBG 201 - 250: 3 units CBG 251 - 300: 5 units CBG 301 - 350: 7 units CBG 351 - 400: 9 units 10 mL 0  . insulin aspart protamine- aspart (NOVOLOG MIX 70/30) (70-30) 100 UNIT/ML injection Inject 0.3 mLs (30 Units total) into the skin 2 (two) times daily. 20 mL 0    Results for orders placed or performed during the hospital encounter of 10/13/16 (from the past 48 hour(s))  Glucose, capillary     Status: Abnormal   Collection Time: 10/13/16  1:14 AM  Result Value Ref Range   Glucose-Capillary 582 (HH) 65 - 99 mg/dL   Comment 1 Notify RN    Comment 2   Document in Chart   Basic metabolic panel     Status: Abnormal   Collection Time: 10/13/16  1:19 AM  Result Value Ref Range   Sodium 130 (L) 135 - 145 mmol/L    Comment: ELECTROLYTES REPEATED.PMH   Potassium 4.6 3.5 - 5.1 mmol/L   Chloride 97 (L) 101 - 111 mmol/L   CO2 9 (L) 22 - 32 mmol/L   Glucose, Bld 566 (HH) 65 - 99 mg/dL    Comment: CRITICAL RESULT CALLED TO, READ BACK BY AND VERIFIED WITH REBECCA LYNN AT 0154 10/13/16.PMH   BUN 17 6 - 20 mg/dL   Creatinine, Ser 1.24 0.61 - 1.24 mg/dL   Calcium 9.5 8.9 - 10.3 mg/dL   GFR calc non Af Amer >60 >60 mL/min   GFR calc Af Amer  >60 >60 mL/min    Comment: (NOTE) The eGFR has been calculated using the CKD EPI equation. This calculation has not been validated in all clinical situations. eGFR's persistently <60 mL/min signify possible Chronic Kidney Disease.    Anion gap 24 (H) 5 - 15  CBC     Status: Abnormal   Collection Time: 10/13/16  1:19 AM  Result Value Ref Range   WBC 12.5 (H) 3.8 - 10.6 K/uL   RBC 5.27 4.40 - 5.90 MIL/uL   Hemoglobin 14.8 13.0 - 18.0 g/dL   HCT 46.6 40.0 - 52.0 %   MCV 88.5 80.0 - 100.0 fL   MCH 28.1 26.0 - 34.0 pg   MCHC 31.7 (L) 32.0 - 36.0 g/dL   RDW 15.6 (H) 11.5 - 14.5 %   Platelets 250 150 - 440 K/uL  Urinalysis, Complete w Microscopic     Status: Abnormal   Collection Time: 10/13/16  1:19 AM  Result Value Ref Range   Color, Urine STRAW (A) YELLOW   APPearance CLEAR (A) CLEAR   Specific Gravity, Urine 1.025 1.005 - 1.030   pH 5.0 5.0 - 8.0   Glucose, UA >=500 (A) NEGATIVE mg/dL   Hgb urine dipstick NEGATIVE NEGATIVE   Bilirubin Urine NEGATIVE NEGATIVE   Ketones, ur 80 (A) NEGATIVE mg/dL   Protein, ur NEGATIVE NEGATIVE mg/dL   Nitrite NEGATIVE NEGATIVE   Leukocytes, UA NEGATIVE NEGATIVE   RBC / HPF NONE SEEN 0 - 5 RBC/hpf   WBC, UA NONE SEEN 0 - 5 WBC/hpf   Bacteria, UA NONE SEEN NONE SEEN   Squamous Epithelial / LPF NONE SEEN NONE SEEN   Mucous PRESENT   Beta-hydroxybutyric acid     Status: Abnormal   Collection Time: 10/13/16  1:19 AM  Result Value Ref Range   Beta-Hydroxybutyric Acid >8.00 (H) 0.05 - 0.27 mmol/L    Comment: RESULT CONFIRMED BY MANUAL DILUTION.PMH  Troponin I     Status: None   Collection Time: 10/13/16  1:19 AM  Result Value Ref Range   Troponin I <0.03 <0.03 ng/mL  Ethanol     Status: None   Collection Time: 10/13/16  1:19 AM  Result Value Ref Range   Alcohol, Ethyl (B) <5 <5 mg/dL    Comment:        LOWEST DETECTABLE LIMIT FOR SERUM ALCOHOL IS 5 mg/dL FOR MEDICAL PURPOSES ONLY   Hepatic function panel     Status: Abnormal   Collection  Time: 10/13/16  1:19 AM  Result Value Ref Range   Total Protein 8.4 (H) 6.5 - 8.1 g/dL   Albumin 4.6 3.5 - 5.0 g/dL   AST 114 (H) 15 - 41   U/L   ALT 167 (H) 17 - 63 U/L   Alkaline Phosphatase 207 (H) 38 - 126 U/L   Total Bilirubin 2.1 (H) 0.3 - 1.2 mg/dL   Bilirubin, Direct 0.2 0.1 - 0.5 mg/dL   Indirect Bilirubin 1.9 (H) 0.3 - 0.9 mg/dL  Lipase, blood     Status: Abnormal   Collection Time: 10/13/16  1:19 AM  Result Value Ref Range   Lipase <10 (L) 11 - 51 U/L  Blood gas, venous     Status: Abnormal (Preliminary result)   Collection Time: 10/13/16  1:33 AM  Result Value Ref Range   FIO2 0.21    pH, Ven 7.21 (L) 7.250 - 7.430   pCO2, Ven 19 (LL) 44.0 - 60.0 mmHg    Comment: CRITICAL RESULT CALLED TO, READ BACK BY AND VERIFIED WITH: REBECCA RN AT 0150 ON [id number] LH    pO2, Ven 74.0 (H) 32.0 - 45.0 mmHg   Bicarbonate PENDING 20.0 - 28.0 mmol/L   Acid-Base Excess PENDING 0.0 - 2.0 mmol/L   O2 Saturation PENDING %   Patient temperature 37.0    Collection site VEIN    Sample type VENOUS   Glucose, capillary     Status: Abnormal   Collection Time: 10/13/16  3:43 AM  Result Value Ref Range   Glucose-Capillary 534 (HH) 65 - 99 mg/dL  Glucose, capillary     Status: Abnormal   Collection Time: 10/13/16  4:58 AM  Result Value Ref Range   Glucose-Capillary 437 (H) 65 - 99 mg/dL   No results found.  Review of Systems  Constitutional: Negative for chills and fever.  HENT: Negative for sore throat and tinnitus.   Eyes: Negative for blurred vision and redness.  Respiratory: Negative for cough and shortness of breath.   Cardiovascular: Negative for chest pain, palpitations, orthopnea and PND.  Gastrointestinal: Positive for abdominal pain, nausea and vomiting. Negative for diarrhea.  Genitourinary: Negative for dysuria, frequency and urgency.  Musculoskeletal: Negative for joint pain and myalgias.  Skin: Negative for rash.       No lesions  Neurological: Positive for headaches.  Negative for speech change, focal weakness and weakness.  Endo/Heme/Allergies: Does not bruise/bleed easily.       No temperature intolerance  Psychiatric/Behavioral: Negative for depression and suicidal ideas.    Blood pressure 125/71, pulse 80, temperature 98.2 F (36.8 C), temperature source Oral, resp. rate (!) 26, height 5' 4" (1.626 m), weight 53.5 kg (117 lb 15.1 oz), SpO2 100 %. Physical Exam  Constitutional: He is oriented to person, place, and time. He appears well-developed. No distress.  HENT:  Head: Normocephalic and atraumatic.  Mouth/Throat: Oropharynx is clear and moist.  Eyes: Conjunctivae and EOM are normal. Pupils are equal, round, and reactive to light. No scleral icterus.  Neck: Normal range of motion. Neck supple. No JVD present. No tracheal deviation present. No thyromegaly present.  Cardiovascular: Normal rate, regular rhythm and normal heart sounds.  Exam reveals no gallop and no friction rub.   No murmur heard. Respiratory: Effort normal and breath sounds normal. No respiratory distress.  GI: Soft. Bowel sounds are normal. He exhibits no distension and no mass. There is tenderness. There is no rebound and no guarding.  Genitourinary:  Genitourinary Comments: Deferred  Musculoskeletal: Normal range of motion. He exhibits no edema.  Lymphadenopathy:    He has no cervical adenopathy.  Neurological: He is alert and oriented to person, place, and time. No cranial nerve deficit.  Skin: Skin  is warm and dry. No rash noted. No erythema.  Eschar and mild thrombophlebitis of left basilic vein  Psychiatric: He has a normal mood and affect. His behavior is normal. Judgment and thought content normal.     Assessment/Plan This is a 25-year-old male admitted for DKA. 1. DKA: The patient has difficulty obtaining his medications but is also known to be noncompliant. Continue insulin drip until anion gap closes at which time feed the patient and start basal insulin in addition  to sliding scale insulin. 2. Hyponatremia: Secondary to hyperglycemia. Monitor sodium. Check BMP every 4 hours. 3. Cocaine abuse: Urine positive for cocaine area the patient also has track marks consistent with intravenous drug use. He states he has not used intravenous drugs for one month. He admits to subjective fevers but has been afebrile here. No heart murmurs on physical exam. 4. DVT prophylaxis: Lovenox 5. GI prophylaxis: None The patient is a full code. Time spent on admission orders and critical care approximately 45 minutes  ,   S, MD 10/13/2016, 5:45 AM   
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
## 2016-10-13 NOTE — ED Provider Notes (Signed)
[facility] Emergency Department Provider Note   ____________________________________________   First MD Initiated Contact with Patient 10/13/16 0130     (approximate)  I have reviewed the triage vital signs and the nursing notes.   HISTORY  Chief Complaint Hyperglycemia    HPI Trevor Brown is a 26 y.o. male who returns to the ED via EMS from home with a chief complaint of hyperglycemia. Patient is a type I diabetic who is an adherent with his insulin. Also has a history of polysubstance abuse with cocaine, alcohol and marijuana. He was seen in the ED just yesterday for same; anion gap closed very quickly with fluids and insulin, and he was discharged home with prescription for insulin. Patient admits he is unable to get his prescription filled until Monday so he has not taking his insulin today. Complains of epigastric discomfort and nausea without vomiting. Deniesfever, chills, chest pain, shortness of breath, diarrhea. Denies recent travel or trauma. Nothing makes his symptoms better or worse.   Past Medical History:  Diagnosis Date  . Diabetes mellitus without complication (HCC)   . Heart attack    Pt claims he had heart attack a year ago ( 2015)- and was [facility], but not given any meds or angiogram, on review of chart- I could not find any details like that.  . Hepatitis C, acute may 2016  . Hypertension     Patient Active Problem List   Diagnosis Date Noted  . Acute renal insufficiency 08/12/2016  . 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 Dehydration 07/05/2016  . Headache 07/05/2016  . Mouth sores 07/05/2016  . Acute gastroenteritis 04/11/2016  . Hypotension 04/11/2016  . Hyponatremia 04/11/2016  . Leukocytosis 04/11/2016  . Elevated transaminase level 03/27/2016  . Diffuse abdominal pain 03/27/2016  . Anemia 03/27/2016  . Diabetes mellitus type 1 (HCC) 03/27/2016  . Tobacco abuse counseling 03/27/2016  . Transaminitis 03/25/2016    . Tobacco abuse 01/10/2016  . Type 1 diabetes mellitus with hyperglycemia (HCC) 12/12/2015  . Cocaine abuse 12/12/2015  . DKA (diabetic ketoacidoses) (HCC) 06/10/2015  . Hidradenitis suppurativa of left axilla   . DKA, type 1 (HCC) 06/06/2015  . Malnutrition of moderate degree (HCC) 04/08/2015  . Hepatitis C 12/13/2014  . Diabetes type 1, uncontrolled (HCC) 12/11/2014  . Major depressive disorder, single episode, mild (HCC)   . Major depression, single episode 12/10/2014  . Diabetes mellitus type 1, uncontrolled (HCC) 12/09/2014  . Homelessness 12/09/2014    History reviewed. No pertinent surgical history.  Prior to Admission medications   Medication Sig Start Date End Date Taking? Authorizing Provider  acyclovir (ZOVIRAX) 400 MG tablet Take 1 tablet (400 mg total) by mouth 5 (five) times daily. 10/12/16  Yes Jeanmarie PlantJames A McShane, MD  insulin aspart (NOVOLOG) 100 UNIT/ML injection 0-9 Units, Subcutaneous, 3 times daily before meals & bedtime, First dose on Fri 03/15/16 at 2200 Correction coverage: Sensitive (thin, NPO, renal) CBG < 70: implement hypoglycemia protocol CBG 70 - 120: 0 units CBG 121 - 150: 1 unit CBG 151 - 200: 2 units CBG 201 - 250: 3 units CBG 251 - 300: 5 units CBG 301 - 350: 7 units CBG 351 - 400: 9 units 10/12/16  Yes Jeanmarie PlantJames A McShane, MD  insulin aspart protamine- aspart (NOVOLOG MIX 70/30) (70-30) 100 UNIT/ML injection Inject 0.3 mLs (30 Units total) into the skin 2 (two) times daily. 10/12/16 11/12/16 Yes Jeanmarie PlantJames A McShane, MD    Allergies Bee venom;  Penicillins; Ibuprofen; Tramadol; and Vancomycin  Family History  Problem Relation Age of Onset  . Cirrhosis Mother   . Diabetes Mellitus II Maternal Grandmother     Social History Social History  Substance Use Topics  . Smoking status: Current Every Day Smoker    Packs/day: 2.00    Types: Cigarettes  . Smokeless tobacco: Never Used  . Alcohol use No    Review of Systems  Constitutional: No  fever/chills. Eyes: No visual changes. ENT: No sore throat. Cardiovascular: Denies chest pain. Respiratory: Denies shortness of breath. Gastrointestinal: Positive for abdominal pain.  Positive for nausea, no vomiting.  No diarrhea.  No constipation. Genitourinary: Negative for dysuria. Musculoskeletal: Negative for back pain. Skin: Negative for rash. Neurological: Negative for headaches, focal weakness or numbness.  10-point ROS otherwise negative.  ____________________________________________   PHYSICAL EXAM:  VITAL SIGNS: ED Triage Vitals  Enc Vitals Group     BP 10/13/16 0113 (!) 141/80     Pulse Rate 10/13/16 0113 95     Resp 10/13/16 0113 18     Temp 10/13/16 0113 97.8 F (36.6 C)     Temp Source 10/13/16 0113 Oral     SpO2 10/13/16 0113 98 %     Weight 10/13/16 0119 120 lb (54.4 kg)     Height 10/13/16 0119 5\' 4"  (1.626 m)     Head Circumference --      Peak Flow --      Pain Score 10/13/16 0119 10     Pain Loc --      Pain Edu? --      Excl. in GC? --     Constitutional: Alert and oriented. Disheveled appearing and in mild acute distress. Eyes: Conjunctivae are normal. PERRL. EOMI. Head: Atraumatic. Nose: No congestion/rhinnorhea. Mouth/Throat: Mucous membranes are moist.  Oropharynx non-erythematous. Neck: No stridor.   Cardiovascular: Tachycardic rate, regular rhythm. Grossly normal heart sounds.  Good peripheral circulation. Respiratory: Normal respiratory effort.  No retractions. Lungs CTAB. Gastrointestinal: Soft and mildly tender to palpation epigastrium without rebound or guarding. No distention. No abdominal bruits. No CVA tenderness. Musculoskeletal: Track marks to bilateral arms. No lower extremity tenderness nor edema.  No joint effusions. Neurologic:  Normal speech and language. No gross focal neurologic deficits are appreciated.  Skin:  Skin is warm, dry and intact. No rash noted. Psychiatric: Mood and affect are normal. Speech and behavior are  normal.  ____________________________________________   LABS (all labs ordered are listed, but only abnormal results are displayed)  Labs Reviewed  GLUCOSE, CAPILLARY - Abnormal; Notable for the following:       Result Value   Glucose-Capillary 582 (*)    All other components within normal limits  BASIC METABOLIC PANEL - Abnormal; Notable for the following:    Sodium 130 (*)    Chloride 97 (*)    CO2 9 (*)    Glucose, Bld 566 (*)    Anion gap 24 (*)    All other components within normal limits  CBC - Abnormal; Notable for the following:    WBC 12.5 (*)    MCHC 31.7 (*)    RDW 15.6 (*)    All other components within normal limits  URINALYSIS, COMPLETE (UACMP) WITH MICROSCOPIC - Abnormal; Notable for the following:    Color, Urine STRAW (*)    APPearance CLEAR (*)    Glucose, UA >=500 (*)    Ketones, ur 80 (*)    All other components within normal limits  BLOOD  GAS, VENOUS - Abnormal; Notable for the following:    pH, Ven 7.21 (*)    pCO2, Ven 19 (*)    pO2, Ven 74.0 (*)    All other components within normal limits  HEPATIC FUNCTION PANEL - Abnormal; Notable for the following:    Total Protein 8.4 (*)    AST 114 (*)    ALT 167 (*)    Alkaline Phosphatase 207 (*)    Total Bilirubin 2.1 (*)    Indirect Bilirubin 1.9 (*)    All other components within normal limits  LIPASE, BLOOD - Abnormal; Notable for the following:    Lipase <10 (*)    All other components within normal limits  TROPONIN I  BETA-HYDROXYBUTYRIC ACID  ETHANOL  CBG MONITORING, ED   ____________________________________________  EKG  ED ECG REPORT I, SUNG,JADE J, the attending physician, personally viewed and interpreted this ECG.   Date: 10/13/2016  EKG Time: 0158  Rate: 93  Rhythm: normal EKG, normal sinus rhythm  Axis: Normal  Intervals:none  ST&T Change:  Nonspecific  ____________________________________________  RADIOLOGY  None ____________________________________________   PROCEDURES  Procedure(s) performed: None  Procedures  Critical Care performed: Yes, see critical care note(s)   CRITICAL CARE Performed by: Irean Hong   Total critical care time: 45 minutes  Critical care time was exclusive of separately billable procedures and treating other patients.  Critical care was necessary to treat or prevent imminent or life-threatening deterioration.  Critical care was time spent personally by me on the following activities: development of treatment plan with patient and/or surrogate as well as nursing, discussions with consultants, evaluation of patient's response to treatment, examination of patient, obtaining history from patient or surrogate, ordering and performing treatments and interventions, ordering and review of laboratory studies, ordering and review of radiographic studies, pulse oximetry and re-evaluation of patient's condition.  ____________________________________________   INITIAL IMPRESSION / ASSESSMENT AND PLAN / ED COURSE  Pertinent labs & imaging results that were available during my care of the patient were reviewed by me and considered in my medical decision making (see chart for details).  26 year old male, type I diabetic who is not adherent to his insulin, well-known to the ED for same, who returns with persistent hyperglycemia. Initial FSBS 582; suspect DKA. Will initiate IV fluid resuscitation, check screening lab work including beta hydroxybutyrate. Anticipate need for insulin drip and hospitalization.  Clinical Course as of Oct 13 240  Sun Oct 13, 2016  0242 Updated patient of laboratory results. He is resting somewhat more comfortably after medicines. Discussed with hospitalist to evaluate patient in the emergency department.   [JS]    Clinical Course User Index [JS] Irean Hong, MD      ____________________________________________   FINAL CLINICAL IMPRESSION(S) / ED DIAGNOSES  Final diagnoses:  Hyperglycemia due to type 1 diabetes mellitus (HCC)  Diabetic ketoacidosis without coma associated with type 1 diabetes mellitus (HCC)      NEW MEDICATIONS STARTED DURING THIS VISIT:  New Prescriptions   No medications on file     Note:  This document was prepared using Dragon voice recognition software and may include unintentional dictation errors.    Irean Hong, MD 10/13/16 [phone]

## 2016-10-13 NOTE — Plan of Care (Signed)
Problem: Pain Managment: Goal: General experience of comfort will improve Outcome: Not Progressing C/O pain and resquests pain meds every 3-4 hours.

## 2016-10-13 NOTE — Progress Notes (Signed)
eLink Physician-Brief Progress Note Patient Name: Trevor Brown DOB: June 11, 1991 MRN: [id number]   Date of Service  10/13/2016  HPI/Events of Note  Pt adm with DKA  eICU Interventions  No new orders.     Intervention Category Evaluation Type: New Patient Evaluation  Shan Levansatrick Nickholas [place] 10/13/2016, 5:34 AM

## 2016-10-13 NOTE — Progress Notes (Signed)
Patient seen and admitted with DKA, no complinat Cont DKA protocol and psych consult as per CSW.

## 2016-10-13 NOTE — Plan of Care (Signed)
Problem: Pain Managment: Goal: General experience of comfort will improve Outcome: Progressing Pain assessments have been completed this shift, PRN medications have been administered based off pain assessments and ordered frequency of medication.

## 2016-10-13 NOTE — ED Triage Notes (Signed)
Pt is here ambulatory for hyperglycemia. Per EMS, CBG is 532 and pt states that CBG at home was >600. Pt is in NAD at this time and was seen for the same last night. Pt also reports that he is unable to get his prescription filled until Monday so has no insulin at home at this time.

## 2016-10-14 DIAGNOSIS — Z794 Long term (current) use of insulin: Secondary | ICD-10-CM

## 2016-10-14 DIAGNOSIS — E1010 Type 1 diabetes mellitus with ketoacidosis without coma: Principal | ICD-10-CM

## 2016-10-14 DIAGNOSIS — F17210 Nicotine dependence, cigarettes, uncomplicated: Secondary | ICD-10-CM

## 2016-10-14 DIAGNOSIS — Z79899 Other long term (current) drug therapy: Secondary | ICD-10-CM

## 2016-10-14 LAB — BASIC METABOLIC PANEL
Anion gap: 7 (ref 5–15)
Anion gap: 5 (ref 5–15)
Anion gap: 7 (ref 5–15)
Anion gap: 9 (ref 5–15)
Anion gap: 8 (ref 5–15)
Anion gap: 9 (ref 5–15)
BUN: 8 mg/dL (ref 6–20)
BUN: 9 mg/dL (ref 6–20)
BUN: 8 mg/dL (ref 6–20)
BUN: 9 mg/dL (ref 6–20)
BUN: 12 mg/dL (ref 6–20)
BUN: 13 mg/dL (ref 6–20)
CO2: 20 mmol/L — ABNORMAL LOW (ref 22–32)
CO2: 21 mmol/L — ABNORMAL LOW (ref 22–32)
CO2: 22 mmol/L (ref 22–32)
CO2: 21 mmol/L — ABNORMAL LOW (ref 22–32)
CO2: 24 mmol/L (ref 22–32)
CO2: 24 mmol/L (ref 22–32)
Calcium: 8.4 mg/dL — ABNORMAL LOW (ref 8.9–10.3)
Calcium: 8.1 mg/dL — ABNORMAL LOW (ref 8.9–10.3)
Calcium: 8.5 mg/dL — ABNORMAL LOW (ref 8.9–10.3)
Calcium: 8.8 mg/dL — ABNORMAL LOW (ref 8.9–10.3)
Calcium: 8.6 mg/dL — ABNORMAL LOW (ref 8.9–10.3)
Calcium: 8.4 mg/dL — ABNORMAL LOW (ref 8.9–10.3)
Chloride: 104 mmol/L (ref 101–111)
Chloride: 105 mmol/L (ref 101–111)
Chloride: 105 mmol/L (ref 101–111)
Chloride: 103 mmol/L (ref 101–111)
Chloride: 103 mmol/L (ref 101–111)
Chloride: 101 mmol/L (ref 101–111)
Creatinine, Ser: 0.61 mg/dL (ref 0.61–1.24)
Creatinine, Ser: 0.76 mg/dL (ref 0.61–1.24)
Creatinine, Ser: 0.72 mg/dL (ref 0.61–1.24)
Creatinine, Ser: 0.77 mg/dL (ref 0.61–1.24)
Creatinine, Ser: 0.67 mg/dL (ref 0.61–1.24)
Creatinine, Ser: 0.69 mg/dL (ref 0.61–1.24)
GFR calc Af Amer: >60 mL/min (ref >60)
GFR calc Af Amer: >60 mL/min (ref >60)
GFR calc Af Amer: >60 mL/min (ref >60)
GFR calc Af Amer: >60 mL/min (ref >60)
GFR calc Af Amer: >60 mL/min (ref >60)
GFR calc Af Amer: >60 mL/min (ref >60)
GFR calc non Af Amer: >60 mL/min (ref >60)
GFR calc non Af Amer: >60 mL/min (ref >60)
GFR calc non Af Amer: >60 mL/min (ref >60)
GFR calc non Af Amer: >60 mL/min (ref >60)
GFR calc non Af Amer: >60 mL/min (ref >60)
GFR calc non Af Amer: >60 mL/min (ref >60)
Glucose, Bld: 144 mg/dL — ABNORMAL HIGH (ref 65–99)
Glucose, Bld: 315 mg/dL — ABNORMAL HIGH (ref 65–99)
Glucose, Bld: 276 mg/dL — ABNORMAL HIGH (ref 65–99)
Glucose, Bld: 299 mg/dL — ABNORMAL HIGH (ref 65–99)
Glucose, Bld: 182 mg/dL — ABNORMAL HIGH (ref 65–99)
Glucose, Bld: 198 mg/dL — ABNORMAL HIGH (ref 65–99)
Potassium: 3 mmol/L — ABNORMAL LOW (ref 3.5–5.1)
Potassium: 4 mmol/L (ref 3.5–5.1)
Potassium: 3.7 mmol/L (ref 3.5–5.1)
Potassium: 3.6 mmol/L (ref 3.5–5.1)
Potassium: 3.3 mmol/L — ABNORMAL LOW (ref 3.5–5.1)
Potassium: 3.5 mmol/L (ref 3.5–5.1)
Sodium: 131 mmol/L — ABNORMAL LOW (ref 135–145)
Sodium: 131 mmol/L — ABNORMAL LOW (ref 135–145)
Sodium: 134 mmol/L — ABNORMAL LOW (ref 135–145)
Sodium: 133 mmol/L — ABNORMAL LOW (ref 135–145)
Sodium: 135 mmol/L (ref 135–145)
Sodium: 134 mmol/L — ABNORMAL LOW (ref 135–145)

## 2016-10-14 LAB — GLUCOSE, CAPILLARY
Glucose-Capillary: 139 mg/dL — ABNORMAL HIGH (ref 65–99)
Glucose-Capillary: 142 mg/dL — ABNORMAL HIGH (ref 65–99)
Glucose-Capillary: 145 mg/dL — ABNORMAL HIGH (ref 65–99)
Glucose-Capillary: 164 mg/dL — ABNORMAL HIGH (ref 65–99)
Glucose-Capillary: 188 mg/dL — ABNORMAL HIGH (ref 65–99)
Glucose-Capillary: 201 mg/dL — ABNORMAL HIGH (ref 65–99)
Glucose-Capillary: 207 mg/dL — ABNORMAL HIGH (ref 65–99)
Glucose-Capillary: 244 mg/dL — ABNORMAL HIGH (ref 65–99)
Glucose-Capillary: 286 mg/dL — ABNORMAL HIGH (ref 65–99)
Glucose-Capillary: 385 mg/dL — ABNORMAL HIGH (ref 65–99)

## 2016-10-14 MED ORDER — INSULIN GLARGINE 100 UNIT/ML ~~LOC~~ SOLN
36.0000 [IU] | Freq: Every day | SUBCUTANEOUS | Status: DC
  Administered 2016-10-14 – 2016-10-15 (×2): 36 [IU] via SUBCUTANEOUS
  Filled 2016-10-14 (×3): qty 0.36

## 2016-10-14 MED ORDER — SODIUM CHLORIDE 0.9 % IV SOLN
INTRAVENOUS | Status: DC
  Administered 2016-10-14: 03:00:00 via INTRAVENOUS

## 2016-10-14 MED ORDER — INSULIN ASPART 100 UNIT/ML ~~LOC~~ SOLN: 0.0000 [IU] | Freq: Every day | SUBCUTANEOUS | Status: DC

## 2016-10-14 MED ORDER — INSULIN ASPART 100 UNIT/ML ~~LOC~~ SOLN
0.0000 [IU] | Freq: Three times a day (TID) | SUBCUTANEOUS | Status: DC
  Administered 2016-10-14: 8 [IU] via SUBCUTANEOUS
  Administered 2016-10-14: 3 [IU] via SUBCUTANEOUS
  Administered 2016-10-14: 15 [IU] via SUBCUTANEOUS
  Administered 2016-10-15: 2 [IU] via SUBCUTANEOUS
  Administered 2016-10-15: 5 [IU] via SUBCUTANEOUS
  Filled 2016-10-14: qty 15
  Filled 2016-10-14 (×2): qty 3
  Filled 2016-10-14: qty 8
  Filled 2016-10-14: qty 5

## 2016-10-14 MED ORDER — SODIUM CHLORIDE 0.9 % IV SOLN
30.0000 meq | Freq: Once | INTRAVENOUS | Status: AC
  Administered 2016-10-14: 30 meq via INTRAVENOUS
  Filled 2016-10-14: qty 15
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
## 2016-10-14 NOTE — Progress Notes (Signed)
Gave patient report to OzoraMarcella on 2C. Patient stable and transported via wheelchair to room 213.

## 2016-10-14 NOTE — Progress Notes (Addendum)
Inpatient Diabetes Program Recommendations  AACE/ADA: New Consensus Statement on Inpatient Glycemic Control (2015)  Target Ranges:  Prepandial:   less than 140 mg/dL      Peak postprandial:   less than 180 mg/dL (1-2 hours)      Critically ill patients:  140 - 180 mg/dL    Review of Glycemic Control  Diabetes history: DM 1 Outpatient Diabetes medications: 70/30 30 units BID (42 units basal, 18 units short acting), Novolog 0-9 units TID Current orders for Inpatient glycemic control: Lantus 36 units Daily, Novolog 0-15 units tid  A1c 10.2% on 09/08/16  Inpatient Diabetes Program Recommendations:   Patient is on more basal insulin at home. Please consider increasing Lantus to 42 units. Please give additional 6 units this am and start with 42 units tomorrow am.  Thanks,  Christena DeemShannon Akaysha Cobern RN, MSN, [facility] Inpatient Diabetes Coordinator Team Pager [phone] (8a-5p)
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
## 2016-10-14 NOTE — Progress Notes (Signed)
This patient has remained A&O x 4 throughout this writer's shift. He has slept on and off throughout same, watched TV while awake. He has received Morphine Q 4 hours, as he states his pain is 8-9 each assessment prior to medication administration due time. The pain is dental related, poor dentition.  The patient has eaten all of each meal today. NOTE - The patient ate half of one dinner tray, then called dietary and stated that he didn't like what they sent without telling them that he had eaten half of it. They sent another tray, to which he custom-ordered. The second tray contained 96 grams of carbs. Along with eating this tray and half of the first dinner tray, he consumed a total of 159 grams of carbs for dinner. The tray incident was not known by the writer until after the patient had done this. When I asked the patient about it, he stated, "Well you all can just give me extra insulin then." He asked for 2 containers of rice cereal as well, however, dietary denied sending. I will pass this information on to the next primary nurse.  DSS case worker came and talked with the patient for approximately 10 minutes today. She asked for Angela's, CNCM phone number. Was given same, stated she would follow-up with Marylene Landngela about this case. Psych evaluated patient for capacity assessment - see consult note.  Patient is currently awaiting a med-surg non-telemetry room assignment.

## 2016-10-14 NOTE — Progress Notes (Signed)
BMP results and FSBS of 286 called to Dr. Sheryle Hailiamond. Waiting for sliding scale insulin orders.

## 2016-10-14 NOTE — Care Management (Signed)
This RNCM received call from ICU RN asking that I speak with rounding BMU provider covering for Dr. Toni Amendlapacs today. I spoke with her by phone and she states that "this is a complicated case and would not be seeing this patient until she speaks with her colleagues". I explained that patient's discharge is approaching and that he has a remarkable history of "not following through" and that it would be best that patient is not expected to follow up in the outpatient setting- that any assessments should be done while he is at hospital. I explained that education has been delivered to patient by this RNCM and many other disciplines each time he visits the hospital. I expressed my concern about patient's ability to make decisions and the need for assessment of learning disability. Shortly after that time, Trevor Brown from [facility] arrived and we went to patient's beside.  I introduced Lorie to patient.  Lorie encouraged him to follow up. She has provided (another) glucometer and will bring LINK passes to provide him with transportation assistance (as we have done in the past).

## 2016-10-14 NOTE — Care Management (Signed)
[facility] she will see patient today per her.

## 2016-10-14 NOTE — Progress Notes (Signed)
Dr Sheryle Hailiamond on unit and updated on pt's last BMP and current blood sugar. Waiting for new orders.

## 2016-10-14 NOTE — Progress Notes (Signed)
Sandwich tray given 

## 2016-10-14 NOTE — Progress Notes (Signed)
[facility] -  at [facility]   PATIENT NAME: Trevor Brown    MR#:  [id number]  DATE OF BIRTH:  24-Feb-1991  SUBJECTIVE:  CHIEF COMPLAINT:   Chief Complaint  Patient presents with  . Hyperglycemia    Came with DKA. Recurrent admissions.   Resolved anion gap and now started diet.  REVIEW OF SYSTEMS:  CONSTITUTIONAL: No fever, fatigue or weakness.  EYES: No blurred or double vision.  EARS, NOSE, AND THROAT: No tinnitus or ear pain.  RESPIRATORY: No cough, shortness of breath, wheezing or hemoptysis.  CARDIOVASCULAR: No chest pain, orthopnea, edema.  GASTROINTESTINAL: No nausea, vomiting, diarrhea or abdominal pain.  GENITOURINARY: No dysuria, hematuria.  ENDOCRINE: No polyuria, nocturia,  HEMATOLOGY: No anemia, easy bruising or bleeding SKIN: No rash or lesion. MUSCULOSKELETAL: No joint pain or arthritis.   NEUROLOGIC: No tingling, numbness, weakness.  PSYCHIATRY: No anxiety or depression.   ROS  DRUG ALLERGIES:   Allergies  Allergen Reactions  . Bee Venom Anaphylaxis  . Penicillins Anaphylaxis, Hives and Other (See Comments)    Has patient had a PCN reaction causing immediate rash, facial/tongue/throat swelling, SOB or lightheadedness with hypotension: Yes Has patient had a PCN reaction causing severe rash involving mucus membranes or skin necrosis: No Has patient had a PCN reaction that required hospitalization No Has patient had a PCN reaction occurring within the last 10 years: Yes If all of the above answers are "NO", then may proceed with Cephalosporin use.  . Ibuprofen Other (See Comments)    Reaction:  GI upset   . Tramadol Hives and Swelling  . Vancomycin Rash and Other (See Comments)    Reaction:  Red man's syndrome     VITALS:  Blood pressure (!) 140/91, pulse 61, temperature 98.7 F (37.1 C), temperature source Oral, resp. rate 19, height 5\' 4"  (1.626 m), weight 53.5 kg (117 lb 15.1 oz), SpO2 98 %.  PHYSICAL EXAMINATION:  GENERAL:   26 y.o.-year-old patient lying in the bed with no acute distress.  EYES: Pupils equal, round, reactive to light and accommodation. No scleral icterus. Extraocular muscles intact.  HEENT: Head atraumatic, normocephalic. Oropharynx and nasopharynx clear.  NECK:  Supple, no jugular venous distention. No thyroid enlargement, no tenderness.  LUNGS: Normal breath sounds bilaterally, no wheezing, rales,rhonchi or crepitation. No use of accessory muscles of respiration.  CARDIOVASCULAR: S1, S2 normal. No murmurs, rubs, or gallops.  ABDOMEN: Soft, nontender, nondistended. Bowel sounds present. No organomegaly or mass.  EXTREMITIES: No pedal edema, cyanosis, or clubbing.  NEUROLOGIC: Cranial nerves II through XII are intact. Muscle strength 5/5 in all extremities. Sensation intact. Gait not checked.  PSYCHIATRIC: The patient is alert and oriented x 3.  SKIN: No obvious rash, lesion, or ulcer.   Physical Exam LABORATORY PANEL:   CBC  Recent Labs Lab 10/13/16 0119  WBC 12.5*  HGB 14.8  HCT 46.6  PLT 250   ------------------------------------------------------------------------------------------------------------------  Chemistries   Recent Labs Lab 10/13/16 0119  10/14/16 2045  NA 130*  < > 134*  K 4.6  < > 3.5  CL 97*  < > 101  CO2 9*  < > 24  GLUCOSE 566*  < > 198*  BUN 17  < > 13  CREATININE 1.24  < > 0.69  CALCIUM 9.5  < > 8.4*  AST 114*  --   --   ALT 167*  --   --   ALKPHOS 207*  --   --   BILITOT 2.1*  --   --   < > =  values in this interval not displayed. ------------------------------------------------------------------------------------------------------------------  Cardiac Enzymes  Recent Labs Lab 10/13/16 0119  TROPONINI <0.03   ------------------------------------------------------------------------------------------------------------------  RADIOLOGY:  No results found.  ASSESSMENT AND PLAN:   Active Problems:   DKA, type 1 (HCC)  * DKA  resolved  with IV fluids and IV Insulin drip.  * Hyponatremia- pseudo, due to hyperglycemia.  * cocaine abuse   No fever or heart murmur  * there is a concern by case managers about his ability to self care, called psych consult.   All the records are reviewed and case discussed with Care Management/Social Workerr. Management plans discussed with the patient, family and they are in agreement.  CODE STATUS: full.  TOTAL TIME TAKING CARE OF THIS PATIENT: 35 minutes.     POSSIBLE D/C IN 1-2 DAYS, DEPENDING ON CLINICAL CONDITION.   Altamese Dilling M.D on 10/14/2016   Between 7am to 6pm - Pager - [phone]  After 6pm go to [email or web address] - Social research officer, government  [facility]  Office  [phone]  CC: Primary care physician; No PCP Per Patient  Note: This dictation was prepared with Dragon dictation along with smaller phrase technology. Any transcriptional errors that result from this process are unintentional.

## 2016-10-15 LAB — BASIC METABOLIC PANEL
Anion gap: 11 (ref 5–15)
Anion gap: 9 (ref 5–15)
Anion gap: 11 (ref 5–15)
BUN: 11 mg/dL (ref 6–20)
BUN: 10 mg/dL (ref 6–20)
BUN: 12 mg/dL (ref 6–20)
CO2: 25 mmol/L (ref 22–32)
CO2: 26 mmol/L (ref 22–32)
CO2: 24 mmol/L (ref 22–32)
Calcium: 8.6 mg/dL — ABNORMAL LOW (ref 8.9–10.3)
Calcium: 8.6 mg/dL — ABNORMAL LOW (ref 8.9–10.3)
Calcium: 8.8 mg/dL — ABNORMAL LOW (ref 8.9–10.3)
Chloride: 98 mmol/L — ABNORMAL LOW (ref 101–111)
Chloride: 99 mmol/L — ABNORMAL LOW (ref 101–111)
Chloride: 100 mmol/L — ABNORMAL LOW (ref 101–111)
Creatinine, Ser: 0.6 mg/dL — ABNORMAL LOW (ref 0.61–1.24)
Creatinine, Ser: 0.55 mg/dL — ABNORMAL LOW (ref 0.61–1.24)
Creatinine, Ser: 0.76 mg/dL (ref 0.61–1.24)
GFR calc Af Amer: >60 mL/min (ref >60)
GFR calc Af Amer: >60 mL/min (ref >60)
GFR calc Af Amer: >60 mL/min (ref >60)
GFR calc non Af Amer: >60 mL/min (ref >60)
GFR calc non Af Amer: >60 mL/min (ref >60)
GFR calc non Af Amer: >60 mL/min (ref >60)
Glucose, Bld: 269 mg/dL — ABNORMAL HIGH (ref 65–99)
Glucose, Bld: 205 mg/dL — ABNORMAL HIGH (ref 65–99)
Glucose, Bld: 224 mg/dL — ABNORMAL HIGH (ref 65–99)
Potassium: 3.3 mmol/L — ABNORMAL LOW (ref 3.5–5.1)
Potassium: 3.1 mmol/L — ABNORMAL LOW (ref 3.5–5.1)
Potassium: 3.2 mmol/L — ABNORMAL LOW (ref 3.5–5.1)
Sodium: 134 mmol/L — ABNORMAL LOW (ref 135–145)
Sodium: 134 mmol/L — ABNORMAL LOW (ref 135–145)
Sodium: 135 mmol/L (ref 135–145)

## 2016-10-15 LAB — GLUCOSE, CAPILLARY
Glucose-Capillary: 227 mg/dL — ABNORMAL HIGH (ref 65–99)
Glucose-Capillary: 213 mg/dL — ABNORMAL HIGH (ref 65–99)
Glucose-Capillary: 192 mg/dL — ABNORMAL HIGH (ref 65–99)

## 2016-10-15 MED ORDER — INSULIN ASPART PROT & ASPART (70-30 MIX) 100 UNIT/ML ~~LOC~~ SUSP: 30.0000 [IU] | Freq: Two times a day (BID) | SUBCUTANEOUS | 0 refills | Status: DC

## 2016-10-15 MED ORDER — MUPIROCIN 2 % EX OINT
1.0000 "application " | TOPICAL_OINTMENT | Freq: Two times a day (BID) | CUTANEOUS | Status: DC
  Filled 2016-10-15: qty 22

## 2016-10-15 MED ORDER — INSULIN ASPART 100 UNIT/ML ~~LOC~~ SOLN: SUBCUTANEOUS | 2 refills | Status: DC

## 2016-10-15 MED ORDER — CHLORHEXIDINE GLUCONATE CLOTH 2 % EX PADS
6.0000 | MEDICATED_PAD | Freq: Every day | CUTANEOUS | Status: DC
  Administered 2016-10-15: 6 via TOPICAL

## 2016-10-15 NOTE — Discharge Summary (Signed)
[facility] [place] at [facility]   PATIENT NAME: Trevor Brown    MR#:  [id number]  DATE OF BIRTH:  1991/07/02  DATE OF ADMISSION:  10/13/2016 ADMITTING PHYSICIAN: Arnaldo Natal, MD  DATE OF DISCHARGE: 10/15/2016  PRIMARY CARE PHYSICIAN: No PCP Per Patient    ADMISSION DIAGNOSIS:  Diabetic ketoacidosis without coma associated with type 1 diabetes mellitus (HCC) [E10.10] Hyperglycemia due to type 1 diabetes mellitus (HCC) [E10.65]  DISCHARGE DIAGNOSIS:  Active Problems:   DKA, type 1 (HCC)   SECONDARY DIAGNOSIS:   Past Medical History:  Diagnosis Date  . Diabetes mellitus without complication (HCC)   . Heart attack    Pt claims he had heart attack a year ago ( 2015)- and was admitted in [facility] for that, but not given any meds or angiogram, on review of chart- I could not find any details like that.  . Hepatitis C, acute may 2016  . Hypertension     HOSPITAL COURSE:   * DKA  resolved with IV fluids and IV Insulin drip.  * Hyponatremia- pseudo, due to hyperglycemia.  * cocaine abuse   No fever or heart murmur  * there is a concern by case managers about his ability to self care, called psych consult.  psych saw him, did not find any active problems or medication needed.  DISCHARGE CONDITIONS:   Stable.  CONSULTS OBTAINED:  Treatment Team:  Altamese Dilling, MD  DRUG ALLERGIES:   Allergies  Allergen Reactions  . Bee Venom Anaphylaxis  . Penicillins Anaphylaxis, Hives and Other (See Comments)    Has patient had a PCN reaction causing immediate rash, facial/tongue/throat swelling, SOB or lightheadedness with hypotension: Yes Has patient had a PCN reaction causing severe rash involving mucus membranes or skin necrosis: No Has patient had a PCN reaction that required hospitalization No Has patient had a PCN reaction occurring within the last 10 years: Yes If all of the above answers are "NO", then may proceed with  Cephalosporin use.  . Ibuprofen Other (See Comments)    Reaction:  GI upset   . Tramadol Hives and Swelling  . Vancomycin Rash and Other (See Comments)    Reaction:  Red man's syndrome     DISCHARGE MEDICATIONS:   Current Discharge Medication List    CONTINUE these medications which have CHANGED   Details  insulin aspart (NOVOLOG) 100 UNIT/ML injection 0-9 Units, Subcutaneous, 3 times daily before meals & bedtime, First dose on Fri 03/15/16 at 2200 Correction coverage: Sensitive (thin, NPO, renal) CBG < 70: implement hypoglycemia protocol CBG 70 - 120: 0 units CBG 121 - 150: 1 unit CBG 151 - 200: 2 units CBG 201 - 250: 3 units CBG 251 - 300: 5 units CBG 301 - 350: 7 units CBG 351 - 400: 9 units Qty: 10 mL, Refills: 2    insulin aspart protamine- aspart (NOVOLOG MIX 70/30) (70-30) 100 UNIT/ML injection Inject 0.3 mLs (30 Units total) into the skin 2 (two) times daily. Qty: 20 mL, Refills: 0      CONTINUE these medications which have NOT CHANGED   Details  acyclovir (ZOVIRAX) 400 MG tablet Take 1 tablet (400 mg total) by mouth 5 (five) times daily. Qty: 35 tablet, Refills: 0         DISCHARGE INSTRUCTIONS:    Follow with PMD in 2-3 weeks.  If you experience worsening of your admission symptoms, develop shortness of breath, life threatening emergency, suicidal or homicidal thoughts you must  seek medical attention immediately by calling 911 or calling your MD immediately  if symptoms less severe.  You Must read complete instructions/literature along with all the possible adverse reactions/side effects for all the Medicines you take and that have been prescribed to you. Take any new Medicines after you have completely understood and accept all the possible adverse reactions/side effects.   Please note  You were cared for by a hospitalist during your hospital stay. If you have any questions about your discharge medications or the care you received while you were in the  hospital after you are discharged, you can call the unit and asked to speak with the hospitalist on call if the hospitalist that took care of you is not available. Once you are discharged, your primary care physician will handle any further medical issues. Please note that NO REFILLS for any discharge medications will be authorized once you are discharged, as it is imperative that you return to your primary care physician (or establish a relationship with a primary care physician if you do not have one) for your aftercare needs so that they can reassess your need for medications and monitor your lab values.    Today   CHIEF COMPLAINT:   Chief Complaint  Patient presents with  . Hyperglycemia    HISTORY OF PRESENT ILLNESS:  Trevor Brown  is a 26 y.o. male with diabetes type 1 presents emergency department complaining of abdominal pain, nausea and vomiting and hyperglycemia. The patient was just seen in the emergency department yesterday for hyperglycemia and shingles. He has been unable to obtain his insulin and presents again today with laboratory values consistent with either ketoacidosis. The patient denies bloody or bilious emesis. He states that his head hurts and that his mouth is painful due to poor dentition. IV insulin as well as intravenous fluid was started in the emergency department the patient was admitted to the ICU by the hospitalist service for further management.    VITAL SIGNS:  Blood pressure 127/72, pulse (!) 56, temperature 97.8 F (36.6 C), temperature source Oral, resp. rate 16, height 5\' 4"  (1.626 m), weight 57.8 kg (127 lb 6.8 oz), SpO2 100 %.  I/O:   Intake/Output Summary (Last 24 hours) at 10/15/16 1353 Last data filed at 10/15/16 0500  Gross per 24 hour  Intake              930 ml  Output             1250 ml  Net             -320 ml    PHYSICAL EXAMINATION:  GENERAL:  26 y.o.-year-old patient lying in the bed with no acute distress.  EYES: Pupils equal,  round, reactive to light and accommodation. No scleral icterus. Extraocular muscles intact.  HEENT: Head atraumatic, normocephalic. Oropharynx and nasopharynx clear.  NECK:  Supple, no jugular venous distention. No thyroid enlargement, no tenderness.  LUNGS: Normal breath sounds bilaterally, no wheezing, rales,rhonchi or crepitation. No use of accessory muscles of respiration.  CARDIOVASCULAR: S1, S2 normal. No murmurs, rubs, or gallops.  ABDOMEN: Soft, non-tender, non-distended. Bowel sounds present. No organomegaly or mass.  EXTREMITIES: No pedal edema, cyanosis, or clubbing.  NEUROLOGIC: Cranial nerves II through XII are intact. Muscle strength 5/5 in all extremities. Sensation intact. Gait not checked.  PSYCHIATRIC: The patient is alert and oriented x 3.  SKIN: No obvious rash, lesion, or ulcer.   DATA REVIEW:   CBC  Recent Labs  Lab 10/13/16 0119  WBC 12.5*  HGB 14.8  HCT 46.6  PLT 250    Chemistries   Recent Labs Lab 10/13/16 0119  10/15/16 0944  NA 130*  < > 135  K 4.6  < > 3.2*  CL 97*  < > 100*  CO2 9*  < > 24  GLUCOSE 566*  < > 224*  BUN 17  < > 12  CREATININE 1.24  < > 0.76  CALCIUM 9.5  < > 8.8*  AST 114*  --   --   ALT 167*  --   --   ALKPHOS 207*  --   --   BILITOT 2.1*  --   --   < > = values in this interval not displayed.  Cardiac Enzymes  Recent Labs Lab 10/13/16 0119  TROPONINI <0.03    Microbiology Results  Results for orders placed or performed during the hospital encounter of 10/13/16  MRSA PCR Screening     Status: Abnormal   Collection Time: 10/13/16  5:28 AM  Result Value Ref Range Status   MRSA by PCR POSITIVE (A) NEGATIVE Final    Comment:        The GeneXpert MRSA Assay (FDA approved for NASAL specimens only), is one component of a comprehensive MRSA colonization surveillance program. It is not intended to diagnose MRSA infection nor to guide or monitor treatment for MRSA infections. RESULT CALLED TO, READ BACK BY AND  VERIFIED WITH: ROBERT [place] ON 10/13/16 AT 2354 BY TLB     RADIOLOGY:  No results found.  EKG:   Orders placed or performed during the hospital encounter of 10/13/16  . ED EKG  . ED EKG      Management plans discussed with the patient, family and they are in agreement.  CODE STATUS:     Code Status Orders        Start     Ordered   10/13/16 0527  Full code  Continuous     10/13/16 0526    Code Status History    Date Active Date Inactive Code Status Order ID Comments User Context   09/08/2016  3:52 AM 09/08/2016  6:37 PM Full Code [id number]  [employer], DO Inpatient   08/12/2016 10:20 PM 08/13/2016  5:10 PM Full Code [id number]  Katharina Caper, MD Inpatient   07/04/2016 12:24 PM 07/04/2016 12:24 PM Full Code [id number]  Altamese Dilling, MD Inpatient   07/04/2016 12:24 PM 07/05/2016  7:36 PM Full Code [id number]  Altamese Dilling, MD Inpatient   04/09/2016  4:54 PM 04/11/2016  2:09 PM Full Code [id number]  Katharina Caper, MD Inpatient   03/26/2016  1:18 AM 03/27/2016  7:11 PM Full Code [id number]  Tonye Royalty, DO Inpatient   03/18/2016  8:15 AM 03/21/2016  8:35 PM Full Code [id number]  Wyatt Haste, MD ED   03/12/2016 12:20 PM 03/16/2016  7:27 PM Full Code [id number]  Enedina Finner, MD Inpatient   02/18/2016  4:59 PM 02/19/2016  5:18 PM Full Code [id number]  Ramonita Lab, MD ED   01/12/2016  8:58 AM 01/15/2016  7:33 PM Full Code [id number]  Milagros Loll, MD ED   01/11/2016 12:35 AM 01/11/2016 11:20 PM Full Code [id number]  Gery Pray, MD Inpatient   12/12/2015 10:04 PM 12/14/2015  6:38 PM Full Code [id number]  Katharina Caper, MD Inpatient   10/23/2015  5:42 PM 10/25/2015  7:04 PM Full Code [id number]  Katha Hamming, MD ED   06/10/2015  8:20 AM 06/12/2015  2:41 PM Full Code [id number]  Arnaldo Natal, MD Inpatient   06/06/2015  8:43 PM 06/07/2015  4:20 PM Full Code [id number]  Enedina Finner, MD Inpatient   04/07/2015 12:45 PM 04/08/2015  8:37 PM Full Code [id number]  Altamese Dilling, MD Inpatient   12/09/2014  2:44 PM 12/13/2014  4:16 PM Full Code [id number]  Gale Journey, MD Inpatient      TOTAL TIME TAKING CARE OF THIS PATIENT: 35 minutes.    Altamese Dilling M.D on 10/15/2016 at 1:53 PM  Between 7am to 6pm - Pager - [phone]  After 6pm go to [email or web address] - Social research officer, government  [facility]  Office  [phone]  CC: Primary care physician; No PCP Per Patient   Note: This dictation was prepared with Dragon dictation along with smaller phrase technology. Any transcriptional errors that result from this process are unintentional.

## 2016-10-15 NOTE — Clinical Social Work Note (Signed)
Patient has transferred out to medical unit/floor care on 2C. Patient's nurse this morning stated to CSW that patient is homeless. As a result, CSW asked patient this morning if he was homeless. Patient stated he was not homeless and that he had a friend to go live with. Patient confirmed that this place has electricity and running water and that he feels safe at this friend's house. York SpanielMonica Garvis Downum MSW,LCSW [phone]

## 2016-10-15 NOTE — Progress Notes (Signed)
Patient discharged to home as ordered Patient instructed to go to medication management to pick up prescription medications. Patient alert and oriented, no acute distress noted, ambulates well without assistance. IV discontinued sites clean and dry. Patient denies pain at this time.

## 2016-10-15 NOTE — Care Management (Signed)
Confirmed with Medication Management that 70/30 and novolog in stock.  Prescriptions faxed to Medication management.  Patient to pick up at discharge.  RNCM signing off

## 2016-10-15 NOTE — Progress Notes (Addendum)
Inpatient Diabetes Program Recommendations  AACE/ADA: New Consensus Statement on Inpatient Glycemic Control (2015)  Target Ranges:  Prepandial:   less than 140 mg/dL      Peak postprandial:   less than 180 mg/dL (1-2 hours)      Critically ill patients:  140 - 180 mg/dL   Results for [name], Trevor B (MRN [id number]) as of 10/15/2016 08:48  Ref. Range 10/14/2016 05:55 10/14/2016 07:47 10/14/2016 11:07 10/14/2016 16:15 10/14/2016 20:06 10/14/2016 21:34  Glucose-Capillary Latest Ref Range: 65 - 99 mg/dL 409286 (H) 811244 (H) 914385 (H) 164 (H) 201 (H) 188 (H)   Results for [name], Trevor B (MRN [id number]) as of 10/15/2016 08:48  Ref. Range 10/15/2016 07:32  Glucose-Capillary Latest Ref Range: 65 - 99 mg/dL 086213 (H)     Diabetes history: DM 1  Outpatient DM meds: 70/30 30 units BID       Novolog 0-9 units TID  Current Insulin Orders: Lantus 36 units Daily      Novolog Moderate Correction Scale/ SSI (0-15 units) TID AC + HS     MD- Note fasting glucose elevated today.  Also having issues with postprandial glucose elevations.  Please consider the following adjustments:  1. Increase Lantus to 40 units daily  2. Start Novolog Meal Coverage: Novolog 4 units TID with meals (hold if pt eats <50% of meal)  3. Can restart pt back on his 70/30 Insulin at time of discharge (70/30 insulin is the most affordable and easiest option for this patient at home.  Patient very well known to the hospital staff and Inpatient DM Team- Multiple admissions)     --Will follow patient during hospitalization--  Ambrose FinlandJeannine Johnston Angelus Hoopes RN, MSN, CDE Diabetes Coordinator Inpatient Glycemic Control Team Team Pager: [phone] (8a-5p)

## 2016-10-16 ENCOUNTER — Telehealth: Payer: Self-pay | Admitting: Pharmacy Technician

## 2016-10-16 NOTE — Telephone Encounter (Signed)
Patient presented with prescriptions for Novolog and Novolog Mix 70/30 from Hospitalist.  [facility]  Explained to patient that he needs to become established with a provider and that I could reach [facility].  [facility]  Did not see appt in EPIC.  When I told patient that I did not see an appointment for him [facility] and would [facility], he did not want me to make an appointment for him.  Patient in hurry.  [place] [employer].  Did not want to complete any paperwork.  Told me that he would call me to schedule an appointment to complete paperwork and [facility].  [facility] have tried at different times to assist patient with paperwork and to get connected with provider.  Patient refuses.  Patient has several disease states that require medication to be taken on a regular basis to manage the disease states appropriately.  Concerned about patient's well-being.  Making a referral to [facility].  Sherilyn DacostaBetty J. Brenen Beigel Care Manager [facility] Management Clinic

## 2016-10-22 ENCOUNTER — Telehealth: Payer: Self-pay

## 2016-10-22 NOTE — Telephone Encounter (Signed)
Spoke with Betty Klutz at [facility] about insulin for Trevor Brown.  Most drug companies require a Medicaid Denial letter which at this time the patient is unable to obtain.  The insulin he does NOT require a MCD denial letter is Lantus.  Levemir, Novolog and Novolin DO require a MCD denial letter.  Please consider when prescribing.

## 2016-11-06 ENCOUNTER — Ambulatory Visit: Payer: Self-pay | Admitting: Internal Medicine

## 2016-12-02 ENCOUNTER — Telehealth: Payer: Self-pay | Admitting: Pharmacy Technician

## 2016-12-02 NOTE — Telephone Encounter (Signed)
[facility] from [facility]  Spoke with Shanda HowellsSabrina Corbett at [facility].  Patient has not lived at [facility] since September 2017.  Patient failed to provide 2018 proof of income.  [facility] unable to provide ongoing medication assistance until eligibility is determined.  Sherilyn DacostaBetty J. Rohith Fauth Care Manager [facility] Management Clinic

## 2016-12-31 ENCOUNTER — Ambulatory Visit: Payer: Self-pay | Admitting: Family Medicine

## 2016-12-31 VITALS — BP 117/66 | HR 80 | Temp 97.9°F | Wt 132.5 lb

## 2016-12-31 DIAGNOSIS — B182 Chronic viral hepatitis C: Secondary | ICD-10-CM

## 2016-12-31 DIAGNOSIS — E119 Type 2 diabetes mellitus without complications: Secondary | ICD-10-CM

## 2016-12-31 DIAGNOSIS — Z720 Tobacco use: Secondary | ICD-10-CM

## 2016-12-31 DIAGNOSIS — E1065 Type 1 diabetes mellitus with hyperglycemia: Secondary | ICD-10-CM

## 2016-12-31 DIAGNOSIS — K032 Erosion of teeth: Secondary | ICD-10-CM

## 2016-12-31 DIAGNOSIS — F1410 Cocaine abuse, uncomplicated: Secondary | ICD-10-CM

## 2016-12-31 DIAGNOSIS — Z5900 Homelessness unspecified: Secondary | ICD-10-CM

## 2016-12-31 DIAGNOSIS — R740 Nonspecific elevation of levels of transaminase and lactic acid dehydrogenase [LDH]: Secondary | ICD-10-CM

## 2016-12-31 DIAGNOSIS — R7401 Elevation of levels of liver transaminase levels: Secondary | ICD-10-CM

## 2016-12-31 LAB — GLUCOSE, POCT (MANUAL RESULT ENTRY): POC Glucose: 121 mg/dl — AB (ref 70–99)

## 2016-12-31 MED ORDER — INSULIN ASPART PROT & ASPART (70-30 MIX) 100 UNIT/ML ~~LOC~~ SUSP: 30.0000 [IU] | Freq: Two times a day (BID) | SUBCUTANEOUS | 3 refills | Status: DC

## 2016-12-31 MED ORDER — ACYCLOVIR 400 MG PO TABS: 400.0000 mg | ORAL_TABLET | Freq: Every day | ORAL | 1 refills | Status: DC

## 2016-12-31 MED ORDER — INSULIN ASPART 100 UNIT/ML ~~LOC~~ SOLN: SUBCUTANEOUS | 2 refills | Status: DC

## 2016-12-31 MED ORDER — CLINDAMYCIN HCL 150 MG PO CAPS: 150.0000 mg | ORAL_CAPSULE | Freq: Three times a day (TID) | ORAL | 0 refills | Status: DC

## 2016-12-31 MED ORDER — NAPROXEN 375 MG PO TABS: 375.0000 mg | ORAL_TABLET | Freq: Two times a day (BID) | ORAL | 0 refills | Status: DC

## 2016-12-31 NOTE — Assessment & Plan Note (Signed)
Recheck labs 

## 2016-12-31 NOTE — Assessment & Plan Note (Signed)
Refer to oral surgeon 

## 2016-12-31 NOTE — Assessment & Plan Note (Signed)
Refer to liver specialist to see about cure/treatment

## 2016-12-31 NOTE — Patient Instructions (Addendum)
Start the antibiotic I've put in referrals for you for: 1. Liver specialist 2. Oral surgeon 3. Diabetes specialist If you have not heard anything from my staff in a week about any orders/referrals/studies from today, please contact us here to follow-up

## 2016-12-31 NOTE — Progress Notes (Signed)
BP 117/66 (Cuff Size: Normal)   Pulse 80   Temp 97.9 F (36.6 C) (Oral)   Wt 132 lb 8 oz (60.1 kg)   BMI 22.74 kg/m    Subjective:    Patient ID: Trevor Brown, male    DOB: 08/11/1990, 26 y.o.   MRN: [id number]  HPI: Trevor Brown is a 26 y.o. male  Chief Complaint  Patient presents with  . Diabetes    needs insulin  . Dental Pain    HPI Here for mouth pain and diabetes  Type 1 diabetes; needs insulin Duration about 6 years He uses two types of insulin 30 units of the 70/30 and the short-acting plain insulin varies, usually about 20 units Some low blood sugars Checking sugars; 124, in between 100 and 200 No kidney problems Vision blurred sometimes Would be interested in insulin pump Hospitalized for his diabetes, feeling better now Dry mouth when sugars are high; frequent urination Last dose of insulin was this morning Just some stomach upset with ibuprofen; no true allergy  Needs to have teeth pulled, uppers and lowers Needs amoxicillin; infected taste in the mouth; swollen glands under the neck Low grade fevers, off and on Pain with mouth, used tylenol and ibuprofen  No flowsheet data found.  Relevant past medical, surgical, family and social history reviewed Past Medical History:  Diagnosis Date  . Diabetes mellitus without complication (HCC)   . Heart attack (HCC)    Pt claims he had heart attack a year ago ( 2015)- and was admitted in [facility] for that, but not given any meds or angiogram, on review of chart- I could not find any details like that.  . Hepatitis C, acute may 2016  . Hypertension    No past surgical history on file. Family History  Problem Relation Age of Onset  . Cirrhosis Mother   . Diabetes Mellitus II Maternal Grandmother    Social History   Social History  . Marital status: Single    Spouse name: N/A  . Number of children: N/A  . Years of education: N/A   Occupational History  . Not on file.   Social History Main Topics   . Smoking status: Current Every Day Smoker    Packs/day: 2.00    Types: Cigarettes  . Smokeless tobacco: Never Used  . Alcohol use No  . 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 Sexual activity: Not on file   Other Topics Concern  . Not on file   Social History Narrative  . No narrative on file   Interim medical history since last visit reviewed. Allergies and medications reviewed  Review of Systems Per HPI unless specifically indicated above     Objective:    BP 117/66 (Cuff Size: Normal)   Pulse 80   Temp 97.9 F (36.6 C) (Oral)   Wt 132 lb 8 oz (60.1 kg)   BMI 22.74 kg/m   Wt Readings from Last 3 Encounters:  12/31/16 132 lb 8 oz (60.1 kg)  10/14/16 127 lb 6.8 oz (57.8 kg)  10/12/16 120 lb (54.4 kg)    Physical Exam  Constitutional: He appears well-developed and well-nourished. No distress.  HENT:  Mouth/Throat: Mucous membranes are not dry. No oral lesions. Abnormal dentition.  Dentition shows erosion to the gums of nearly all of his teeth  Eyes: No scleral icterus.  Cardiovascular: Normal rate and regular rhythm.   Pulmonary/Chest: Effort normal and  breath sounds normal.  Neurological: He is alert.  Psychiatric: He has a normal mood and affect. He does not exhibit a depressed mood.  Only fair eye contact; better eye contact later in the visit       Assessment & Plan:   Problem List Items Addressed This Visit      Digestive   Hepatitis C    Refer to liver specialist to see about cure/treatment      Relevant Medications   acyclovir (ZOVIRAX) 400 MG tablet   clindamycin (CLEOCIN) 150 MG capsule   Other Relevant Orders   Ambulatory referral to Gastroenterology   Comprehensive metabolic panel (Completed)   Dental erosion extending into pulp    Refer to oral surgeon      Relevant Orders   Ambulatory referral to Oral Maxillofacial Surgery     Endocrine   Diabetes mellitus type 1, uncontrolled (HCC) (Chronic)    Refer to  endocrinologist, diabetes clinic; prescriptions for insulin given      Relevant Medications   insulin aspart protamine- aspart (NOVOLOG MIX 70/30) (70-30) 100 UNIT/ML injection   insulin aspart (NOVOLOG) 100 UNIT/ML injection   Other Relevant Orders   Ambulatory referral to Endocrinology   Hemoglobin A1c (Completed)   Lipid panel (Completed)     Other   Transaminitis    Recheck labs      Relevant Orders   Comprehensive metabolic panel (Completed)   Tobacco abuse    Not quite ready to quit, here to help if/when ready      Homelessness (Chronic)    Intermittent issue; has Child psychotherapist      Cocaine abuse    Not using now       Other Visit Diagnoses    Diabetes mellitus without complication (HCC)    -  Primary   Relevant Medications   insulin aspart protamine- aspart (NOVOLOG MIX 70/30) (70-30) 100 UNIT/ML injection   insulin aspart (NOVOLOG) 100 UNIT/ML injection   Other Relevant Orders   POCT Glucose (CBG) (Completed)   Comprehensive metabolic panel (Completed)   Hemoglobin A1c (Completed)   Lipid panel (Completed)       Follow up plan: No Follow-up on file.  An after-visit summary was printed and given to the patient at check-out.  Please see the patient instructions which may contain other information and recommendations beyond what is mentioned above in the assessment and plan.  Meds ordered this encounter  Medications  . naproxen (NAPROSYN) 375 MG tablet    Sig: Take 1 tablet (375 mg total) by mouth 2 (two) times daily with a meal. If needed for mouth pain    Dispense:  30 tablet    Refill:  0  . insulin aspart protamine- aspart (NOVOLOG MIX 70/30) (70-30) 100 UNIT/ML injection    Sig: Inject 0.3 mLs (30 Units total) into the skin 2 (two) times daily.    Dispense:  20 mL    Refill:  3  . insulin aspart (NOVOLOG) 100 UNIT/ML injection    Sig: Give subcutaneously before meals, 10-20 units each dose    Dispense:  10 mL    Refill:  2  . acyclovir  (ZOVIRAX) 400 MG tablet    Sig: Take 1 tablet (400 mg total) by mouth 5 (five) times daily.    Dispense:  35 tablet    Refill:  1  . clindamycin (CLEOCIN) 150 MG capsule    Sig: Take 1 capsule (150 mg total) by mouth 3 (three) times daily.  Dispense:  21 capsule    Refill:  0    Orders Placed This Encounter  Procedures  . Comprehensive metabolic panel  . Hemoglobin A1c  . Lipid panel  . Ambulatory referral to Endocrinology  . Ambulatory referral to Gastroenterology  . Ambulatory referral to Oral Maxillofacial Surgery  . POCT Glucose (CBG)

## 2016-12-31 NOTE — Assessment & Plan Note (Signed)
Not quite ready to quit, here to help if/when ready

## 2016-12-31 NOTE — Assessment & Plan Note (Signed)
Intermittent issue; has Child psychotherapistsocial worker

## 2016-12-31 NOTE — Assessment & Plan Note (Signed)
Not using now.

## 2016-12-31 NOTE — Assessment & Plan Note (Signed)
Refer to endocrinologist, diabetes clinic; prescriptions for insulin given

## 2017-01-01 LAB — COMPREHENSIVE METABOLIC PANEL
ALT: 30 IU/L (ref 0–44)
AST: 35 IU/L (ref 0–40)
Albumin/Globulin Ratio: 1.7 (ref 1.2–2.2)
Albumin: 4.3 g/dL (ref 3.5–5.5)
Alkaline Phosphatase: 227 IU/L — ABNORMAL HIGH (ref 39–117)
BUN/Creatinine Ratio: 25 — ABNORMAL HIGH (ref 9–20)
BUN: 18 mg/dL (ref 6–20)
Bilirubin Total: <0.2 mg/dL (ref 0.0–1.2)
CO2: 26 mmol/L (ref 20–29)
Calcium: 9.5 mg/dL (ref 8.7–10.2)
Chloride: 95 mmol/L — ABNORMAL LOW (ref 96–106)
Creatinine, Ser: 0.73 mg/dL — ABNORMAL LOW (ref 0.76–1.27)
GFR calc Af Amer: 151 mL/min/{1.73_m2} (ref >59)
GFR calc non Af Amer: 131 mL/min/{1.73_m2} (ref >59)
Globulin, Total: 2.6 g/dL (ref 1.5–4.5)
Glucose: 181 mg/dL — ABNORMAL HIGH (ref 65–99)
Potassium: 4.9 mmol/L (ref 3.5–5.2)
Sodium: 138 mmol/L (ref 134–144)
Total Protein: 6.9 g/dL (ref 6.0–8.5)

## 2017-01-01 LAB — HEMOGLOBIN A1C
Est. average glucose Bld gHb Est-mCnc: 246 mg/dL
Hgb A1c MFr Bld: 10.2 % — ABNORMAL HIGH (ref 4.8–5.6)

## 2017-01-01 LAB — LIPID PANEL
Chol/HDL Ratio: 5.2 ratio — ABNORMAL HIGH (ref 0.0–5.0)
Cholesterol, Total: 209 mg/dL — ABNORMAL HIGH (ref 100–199)
HDL: 40 mg/dL (ref >39)
LDL Calculated: 115 mg/dL — ABNORMAL HIGH (ref 0–99)
Triglycerides: 271 mg/dL — ABNORMAL HIGH (ref 0–149)
VLDL Cholesterol Cal: 54 mg/dL — ABNORMAL HIGH (ref 5–40)

## 2017-01-10 ENCOUNTER — Telehealth: Payer: Self-pay

## 2017-01-10 NOTE — Telephone Encounter (Signed)
Dental referral sent over today.

## 2017-02-03 ENCOUNTER — Encounter: Payer: Self-pay | Admitting: Intensive Care

## 2017-02-03 ENCOUNTER — Inpatient Hospital Stay
Admission: EM | Admit: 2017-02-03 | Discharge: 2017-02-04 | DRG: 639 | Disposition: A | Discharge status: Home / Self Care | Payer: Self-pay | Attending: Internal Medicine | Admitting: Internal Medicine

## 2017-02-03 DIAGNOSIS — F1410 Cocaine abuse, uncomplicated: Secondary | ICD-10-CM | POA: Diagnosis present

## 2017-02-03 DIAGNOSIS — Z9114 Patient's other noncompliance with medication regimen: Secondary | ICD-10-CM

## 2017-02-03 DIAGNOSIS — I10 Essential (primary) hypertension: Secondary | ICD-10-CM | POA: Diagnosis present

## 2017-02-03 DIAGNOSIS — B1920 Unspecified viral hepatitis C without hepatic coma: Secondary | ICD-10-CM | POA: Diagnosis present

## 2017-02-03 DIAGNOSIS — F17210 Nicotine dependence, cigarettes, uncomplicated: Secondary | ICD-10-CM | POA: Diagnosis present

## 2017-02-03 DIAGNOSIS — Z91030 Bee allergy status: Secondary | ICD-10-CM

## 2017-02-03 DIAGNOSIS — E875 Hyperkalemia: Secondary | ICD-10-CM | POA: Diagnosis present

## 2017-02-03 DIAGNOSIS — F329 Major depressive disorder, single episode, unspecified: Secondary | ICD-10-CM | POA: Diagnosis present

## 2017-02-03 DIAGNOSIS — Z833 Family history of diabetes mellitus: Secondary | ICD-10-CM

## 2017-02-03 DIAGNOSIS — Z886 Allergy status to analgesic agent status: Secondary | ICD-10-CM

## 2017-02-03 DIAGNOSIS — E1010 Type 1 diabetes mellitus with ketoacidosis without coma: Principal | ICD-10-CM | POA: Diagnosis present

## 2017-02-03 DIAGNOSIS — Z880 Allergy status to penicillin: Secondary | ICD-10-CM

## 2017-02-03 DIAGNOSIS — Z881 Allergy status to other antibiotic agents status: Secondary | ICD-10-CM

## 2017-02-03 DIAGNOSIS — E1110 Type 2 diabetes mellitus with ketoacidosis without coma: Secondary | ICD-10-CM | POA: Diagnosis present

## 2017-02-03 DIAGNOSIS — Z888 Allergy status to other drugs, medicaments and biological substances status: Secondary | ICD-10-CM

## 2017-02-03 DIAGNOSIS — I252 Old myocardial infarction: Secondary | ICD-10-CM

## 2017-02-03 LAB — BASIC METABOLIC PANEL
Anion gap: 22 — ABNORMAL HIGH (ref 5–15)
Anion gap: 16 — ABNORMAL HIGH (ref 5–15)
Anion gap: 8 (ref 5–15)
Anion gap: 6 (ref 5–15)
BUN: 23 mg/dL — ABNORMAL HIGH (ref 6–20)
BUN: 21 mg/dL — ABNORMAL HIGH (ref 6–20)
BUN: 17 mg/dL (ref 6–20)
BUN: 16 mg/dL (ref 6–20)
CO2: 15 mmol/L — ABNORMAL LOW (ref 22–32)
CO2: 15 mmol/L — ABNORMAL LOW (ref 22–32)
CO2: 20 mmol/L — ABNORMAL LOW (ref 22–32)
CO2: 22 mmol/L (ref 22–32)
Calcium: 9.7 mg/dL (ref 8.9–10.3)
Calcium: 8.6 mg/dL — ABNORMAL LOW (ref 8.9–10.3)
Calcium: 8.1 mg/dL — ABNORMAL LOW (ref 8.9–10.3)
Calcium: 8.4 mg/dL — ABNORMAL LOW (ref 8.9–10.3)
Chloride: 97 mmol/L — ABNORMAL LOW (ref 101–111)
Chloride: 109 mmol/L (ref 101–111)
Chloride: 107 mmol/L (ref 101–111)
Chloride: 109 mmol/L (ref 101–111)
Creatinine, Ser: 1.19 mg/dL (ref 0.61–1.24)
Creatinine, Ser: 1.04 mg/dL (ref 0.61–1.24)
Creatinine, Ser: 0.79 mg/dL (ref 0.61–1.24)
Creatinine, Ser: 0.67 mg/dL (ref 0.61–1.24)
GFR calc Af Amer: >60 mL/min (ref >60)
GFR calc Af Amer: >60 mL/min (ref >60)
GFR calc Af Amer: >60 mL/min (ref >60)
GFR calc Af Amer: >60 mL/min (ref >60)
GFR calc non Af Amer: >60 mL/min (ref >60)
GFR calc non Af Amer: >60 mL/min (ref >60)
GFR calc non Af Amer: >60 mL/min (ref >60)
GFR calc non Af Amer: >60 mL/min (ref >60)
Glucose, Bld: 546 mg/dL (ref 65–99)
Glucose, Bld: 276 mg/dL — ABNORMAL HIGH (ref 65–99)
Glucose, Bld: 312 mg/dL — ABNORMAL HIGH (ref 65–99)
Glucose, Bld: 81 mg/dL (ref 65–99)
Potassium: 5.3 mmol/L — ABNORMAL HIGH (ref 3.5–5.1)
Potassium: 4.4 mmol/L (ref 3.5–5.1)
Potassium: 4 mmol/L (ref 3.5–5.1)
Potassium: 3.4 mmol/L — ABNORMAL LOW (ref 3.5–5.1)
Sodium: 134 mmol/L — ABNORMAL LOW (ref 135–145)
Sodium: 140 mmol/L (ref 135–145)
Sodium: 135 mmol/L (ref 135–145)
Sodium: 137 mmol/L (ref 135–145)

## 2017-02-03 LAB — CBC
HCT: 45.4 % (ref 40.0–52.0)
HCT: 38.7 % — ABNORMAL LOW (ref 40.0–52.0)
Hemoglobin: 15.1 g/dL (ref 13.0–18.0)
Hemoglobin: 12.8 g/dL — ABNORMAL LOW (ref 13.0–18.0)
MCH: 29.9 pg (ref 26.0–34.0)
MCH: 29.4 pg (ref 26.0–34.0)
MCHC: 33.3 g/dL (ref 32.0–36.0)
MCHC: 33.1 g/dL (ref 32.0–36.0)
MCV: 89.8 fL (ref 80.0–100.0)
MCV: 88.7 fL (ref 80.0–100.0)
Platelets: 222 10*3/uL (ref 150–440)
Platelets: 227 10*3/uL (ref 150–440)
RBC: 5.05 MIL/uL (ref 4.40–5.90)
RBC: 4.36 MIL/uL — ABNORMAL LOW (ref 4.40–5.90)
RDW: 14.5 % (ref 11.5–14.5)
RDW: 14.2 % (ref 11.5–14.5)
WBC: 8.9 10*3/uL (ref 3.8–10.6)
WBC: 8.6 10*3/uL (ref 3.8–10.6)

## 2017-02-03 LAB — GLUCOSE, CAPILLARY
Glucose-Capillary: 487 mg/dL — ABNORMAL HIGH (ref 65–99)
Glucose-Capillary: 424 mg/dL — ABNORMAL HIGH (ref 65–99)
Glucose-Capillary: 331 mg/dL — ABNORMAL HIGH (ref 65–99)
Glucose-Capillary: 273 mg/dL — ABNORMAL HIGH (ref 65–99)
Glucose-Capillary: 172 mg/dL — ABNORMAL HIGH (ref 65–99)
Glucose-Capillary: 221 mg/dL — ABNORMAL HIGH (ref 65–99)
Glucose-Capillary: 301 mg/dL — ABNORMAL HIGH (ref 65–99)
Glucose-Capillary: 287 mg/dL — ABNORMAL HIGH (ref 65–99)
Glucose-Capillary: 246 mg/dL — ABNORMAL HIGH (ref 65–99)
Glucose-Capillary: 127 mg/dL — ABNORMAL HIGH (ref 65–99)
Glucose-Capillary: 82 mg/dL (ref 65–99)
Glucose-Capillary: 106 mg/dL — ABNORMAL HIGH (ref 65–99)

## 2017-02-03 LAB — URINALYSIS, COMPLETE (UACMP) WITH MICROSCOPIC
Bacteria, UA: NONE SEEN
Bilirubin Urine: NEGATIVE
Glucose, UA: >=500 mg/dL — AB
Hgb urine dipstick: NEGATIVE
Ketones, ur: 80 mg/dL — AB
Leukocytes, UA: NEGATIVE
Nitrite: NEGATIVE
Protein, ur: NEGATIVE mg/dL
RBC / HPF: NONE SEEN RBC/hpf (ref 0–5)
Specific Gravity, Urine: 1.023 (ref 1.005–1.030)
Squamous Epithelial / LPF: NONE SEEN
pH: 5 (ref 5.0–8.0)

## 2017-02-03 LAB — MRSA PCR SCREENING: MRSA by PCR: NEGATIVE

## 2017-02-03 MED ORDER — ONDANSETRON HCL 4 MG/2ML IJ SOLN
4.0000 mg | Freq: Once | INTRAMUSCULAR | Status: AC
  Administered 2017-02-03: 4 mg via INTRAVENOUS
  Filled 2017-02-03: qty 2

## 2017-02-03 MED ORDER — ENOXAPARIN SODIUM 40 MG/0.4ML ~~LOC~~ SOLN: 40.0000 mg | SUBCUTANEOUS | Status: DC

## 2017-02-03 MED ORDER — INSULIN ASPART PROT & ASPART (70-30 MIX) 100 UNIT/ML ~~LOC~~ SUSP: 30.0000 [IU] | Freq: Two times a day (BID) | SUBCUTANEOUS | Status: DC

## 2017-02-03 MED ORDER — SODIUM CHLORIDE 0.9 % IV SOLN
INTRAVENOUS | Status: DC
  Administered 2017-02-03: 14:00:00 via INTRAVENOUS

## 2017-02-03 MED ORDER — MORPHINE SULFATE (PF) 4 MG/ML IV SOLN
4.0000 mg | Freq: Once | INTRAVENOUS | Status: AC
  Administered 2017-02-03: 4 mg via INTRAVENOUS
  Filled 2017-02-03: qty 1

## 2017-02-03 MED ORDER — ACETAMINOPHEN 325 MG PO TABS
650.0000 mg | ORAL_TABLET | Freq: Four times a day (QID) | ORAL | Status: DC | PRN
  Administered 2017-02-03: 650 mg via ORAL
  Filled 2017-02-03: qty 2

## 2017-02-03 MED ORDER — ONDANSETRON HCL 4 MG/2ML IJ SOLN
4.0000 mg | Freq: Four times a day (QID) | INTRAMUSCULAR | Status: DC
  Administered 2017-02-03: 4 mg via INTRAVENOUS

## 2017-02-03 MED ORDER — SODIUM CHLORIDE 0.9 % IV SOLN: INTRAVENOUS | Status: DC

## 2017-02-03 MED ORDER — MORPHINE SULFATE (PF) 2 MG/ML IV SOLN: 2.0000 mg | Freq: Once | INTRAVENOUS | Status: DC

## 2017-02-03 MED ORDER — INSULIN ASPART 100 UNIT/ML ~~LOC~~ SOLN: 0.0000 [IU] | Freq: Every day | SUBCUTANEOUS | Status: DC

## 2017-02-03 MED ORDER — INSULIN ASPART 100 UNIT/ML ~~LOC~~ SOLN: 0.0000 [IU] | Freq: Three times a day (TID) | SUBCUTANEOUS | Status: DC

## 2017-02-03 MED ORDER — SODIUM CHLORIDE 0.9 % IV SOLN
INTRAVENOUS | Status: DC
  Administered 2017-02-03: 3.6 [IU]/h via INTRAVENOUS
  Filled 2017-02-03: qty 1

## 2017-02-03 MED ORDER — SODIUM CHLORIDE 0.9 % IV BOLUS (SEPSIS)
1000.0000 mL | Freq: Once | INTRAVENOUS | Status: AC
  Administered 2017-02-03: 1000 mL via INTRAVENOUS

## 2017-02-03 MED ORDER — ENOXAPARIN SODIUM 40 MG/0.4ML ~~LOC~~ SOLN
40.0000 mg | SUBCUTANEOUS | Status: DC
  Filled 2017-02-03: qty 0.4

## 2017-02-03 MED ORDER — INSULIN GLARGINE 100 UNIT/ML ~~LOC~~ SOLN
12.0000 [IU] | Freq: Every day | SUBCUTANEOUS | Status: DC
  Administered 2017-02-03: 12 [IU] via SUBCUTANEOUS
  Filled 2017-02-03 (×2): qty 0.12

## 2017-02-03 MED ORDER — POTASSIUM CHLORIDE CRYS ER 20 MEQ PO TBCR
20.0000 meq | EXTENDED_RELEASE_TABLET | Freq: Once | ORAL | Status: AC
  Administered 2017-02-04: 20 meq via ORAL
  Filled 2017-02-03: qty 1

## 2017-02-03 MED ORDER — SODIUM CHLORIDE 0.9 % IV SOLN
INTRAVENOUS | Status: DC
  Administered 2017-02-03: 15:00:00 via INTRAVENOUS

## 2017-02-03 MED ORDER — DEXTROSE-NACL 5-0.45 % IV SOLN
INTRAVENOUS | Status: DC
  Administered 2017-02-03: 16:00:00 via INTRAVENOUS

## 2017-02-03 MED ORDER — MORPHINE SULFATE (PF) 2 MG/ML IV SOLN
2.0000 mg | Freq: Once | INTRAVENOUS | Status: AC
  Administered 2017-02-03: 2 mg via INTRAVENOUS

## 2017-02-03 MED ORDER — ONDANSETRON HCL 4 MG/2ML IJ SOLN
INTRAMUSCULAR | Status: AC
  Filled 2017-02-03: qty 2

## 2017-02-03 MED ORDER — ONDANSETRON HCL 4 MG/2ML IJ SOLN: 4.0000 mg | Freq: Four times a day (QID) | INTRAMUSCULAR | Status: DC | PRN

## 2017-02-03 MED ORDER — MORPHINE SULFATE (PF) 2 MG/ML IV SOLN
INTRAVENOUS | Status: AC
  Administered 2017-02-03: 2 mg via INTRAVENOUS
  Filled 2017-02-03: qty 1

## 2017-02-03 MED ORDER — DEXTROSE-NACL 5-0.45 % IV SOLN: INTRAVENOUS | Status: DC

## 2017-02-03 NOTE — Progress Notes (Signed)
eLink Physician-Brief Progress Note Patient Name: Emeline Darlingony B Marro DOB: 1990-09-25 MRN: [id number]   Date of Service  02/03/2017  HPI/Events of Note  dka Ph 7.21 Bicarb 15 Large gap On inulin drip Camera care , he is awake alert, no distress Eating a large meal with what appears to be a chicken sandwich Would prefer him to NOT be on diet while on insulin drip Will d/w RN, MD  eICU Interventions       Intervention Category Evaluation Type: New Patient Evaluation  [name],Kazi Montoro J. 02/03/2017, 3:20 PM

## 2017-02-03 NOTE — ED Provider Notes (Signed)
[facility] Emergency Department Provider Note  Time seen: 9:58 AM  I have reviewed the triage vital signs and the nursing notes.   HISTORY  Chief Complaint Hyperglycemia    HPI Trevor Brown is a 26 y.o. male with a past medical history of diabetes, hypertension, several admissions for DKA in the past who presents to the emergency department for high blood sugar nausea vomiting and abdominal pain. According to the patient he ran out of insulin yesterday since last night he has been experiencing abdominal discomfort nausea or vomiting with high blood sugars. Patient states it feels like DKA. He also states leg cramps since yesterday which she states are typical when he goes into DKA. Denies any fever. Denies diarrhea. Denies dysuria. Describes his abdominal pain and leg pain as moderate cramping type pain.  Past Medical History:  Diagnosis Date  . Diabetes mellitus without complication (HCC)   . Heart attack (HCC)    Pt claims he had heart attack a year ago ( 2015)- and was admitted in [facility], but not given any meds or angiogram, on review of chart- I could not find any details like that.  . Hepatitis C, acute may 2016  . Hypertension     Patient Active Problem List   Diagnosis Date Noted  . Dental erosion extending into pulp 12/31/2016  . 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 Elevated transaminase level 03/27/2016  . Diabetes mellitus type 1 (HCC) 03/27/2016  . Tobacco abuse counseling 03/27/2016  . Transaminitis 03/25/2016  . Tobacco abuse 01/10/2016  . Type 1 diabetes mellitus with hyperglycemia (HCC) 12/12/2015  . Cocaine abuse 12/12/2015  . DKA (diabetic ketoacidoses) (HCC) 06/10/2015  . Hidradenitis suppurativa of left axilla   . DKA, type 1 (HCC) 06/06/2015  . Malnutrition of moderate degree (HCC) 04/08/2015  . Hepatitis C 12/13/2014  . Diabetes type 1, uncontrolled (HCC) 12/11/2014  . Major depressive disorder, single episode, mild (HCC)   .  Major depression, single episode 12/10/2014  . Diabetes mellitus type 1, uncontrolled (HCC) 12/09/2014  . Homelessness 12/09/2014    History reviewed. No pertinent surgical history.  Prior to Admission medications   Medication Sig Start Date End Date Taking? Authorizing Provider  acyclovir (ZOVIRAX) 400 MG tablet Take 1 tablet (400 mg total) by mouth 5 (five) times daily. 12/31/16   Kerman PasseyLada, Melinda P, MD  clindamycin (CLEOCIN) 150 MG capsule Take 1 capsule (150 mg total) by mouth 3 (three) times daily. 12/31/16   Kerman PasseyLada, Melinda P, MD  insulin aspart (NOVOLOG) 100 UNIT/ML injection Give subcutaneously before meals, 10-20 units each dose 12/31/16   Lada, Janit BernMelinda P, MD  insulin aspart protamine- aspart (NOVOLOG MIX 70/30) (70-30) 100 UNIT/ML injection Inject 0.3 mLs (30 Units total) into the skin 2 (two) times daily. 12/31/16 01/31/17  Kerman PasseyLada, Melinda P, MD  naproxen (NAPROSYN) 375 MG tablet Take 1 tablet (375 mg total) by mouth 2 (two) times daily with a meal. If needed for mouth pain 12/31/16   Lada, Janit BernMelinda P, MD    Allergies  Allergen Reactions  . Bee Venom Anaphylaxis  . Penicillins Anaphylaxis, Hives and Other (See Comments)    Has patient had a PCN reaction causing immediate rash, facial/tongue/throat swelling, SOB or lightheadedness with hypotension: Yes Has patient had a PCN reaction causing severe rash involving mucus membranes or skin necrosis: No Has patient had a PCN reaction that required hospitalization No Has patient had a PCN reaction occurring within the last 10 years: Yes If all  of the above answers are "NO", then may proceed with Cephalosporin use.  . Ibuprofen Other (See Comments)    Reaction:  GI upset   . Tramadol Hives and Swelling  . Vancomycin Rash and Other (See Comments)    Reaction:  Red man's syndrome     Family History  Problem Relation Age of Onset  . Cirrhosis Mother   . Diabetes Mellitus II Maternal Grandmother     Social History Social History  Substance  Use Topics  . Smoking status: Current Every Day Smoker    Packs/day: 2.00    Types: Cigarettes  . Smokeless tobacco: Never Used  . Alcohol use No    Review of Systems Constitutional: Negative for fever. Cardiovascular: Negative for chest pain. Respiratory: Negative for shortness of breath. Gastrointestinal: Diffuse moderate abdominal cramping. Positive for nausea and vomiting. Negative diarrhea. Genitourinary: Negative for dysuria. Musculoskeletal: Negative for back pain. Neurological: Negative for headache All other ROS negative  ____________________________________________   PHYSICAL EXAM:  VITAL SIGNS: ED Triage Vitals  Enc Vitals Group     BP 02/03/17 0945 126/70     Pulse Rate 02/03/17 0945 88     Resp 02/03/17 0945 16     Temp 02/03/17 0945 97.7 F (36.5 C)     Temp Source 02/03/17 0945 Oral     SpO2 02/03/17 0945 100 %     Weight 02/03/17 0946 130 lb (59 kg)     Height 02/03/17 0946 5\' 4"  (1.626 m)     Head Circumference --      Peak Flow --      Pain Score 02/03/17 0945 8     Pain Loc --      Pain Edu? --      Excl. in GC? --     Constitutional: Alert and oriented. Well appearing and in no distress. Eyes: Normal exam ENT   Head: Normocephalic and atraumatic.   Mouth/Throat:Dry mucous membranes, poor dentition. Cardiovascular: Normal rate, regular rhythm. No murmur Respiratory: Normal respiratory effort without tachypnea nor retractions. Breath sounds are clear  Gastrointestinal: Soft, mild diffuse tenderness to palpation without focal tenderness identified. No rebound or guarding. No distention. Musculoskeletal: Nontender with normal range of motion in all extremities. Neurologic:  Normal speech and language. No gross focal neurologic deficits  Skin:  Skin is warm, dry and intact.  Psychiatric: Mood and affect are normal.   ____________________________________________   INITIAL IMPRESSION / ASSESSMENT AND PLAN / ED COURSE  Pertinent labs &  imaging results that were available during my care of the patient were reviewed by me and considered in my medical decision making (see chart for details).  The patient presents to the emergency department for hyperglycemia, nausea, vomiting, diffuse abdominal discomfort. Patient has very dry mucous membranes on exam, suspect likely DKA. We will check labs including a VBG, IV hydrate and continue to closely monitor.  Patient's labs show a pH of 7.21 with an anion gap of 22 consistent with diabetic ketoacidosis. We will place on an insulin drip and admitted to the hospital.  CRITICAL CARE Performed by: Minna Antis   Total critical care time: 30 minutes  Critical care time was exclusive of separately billable procedures and treating other patients.  Critical care was necessary to treat or prevent imminent or life-threatening deterioration.  Critical care was time spent personally by me on the following activities: development of treatment plan with patient and/or surrogate as well as nursing, discussions with consultants, evaluation of patient's response to  treatment, examination of patient, obtaining history from patient or surrogate, ordering and performing treatments and interventions, ordering and review of laboratory studies, ordering and review of radiographic studies, pulse oximetry and re-evaluation of patient's condition.   ____________________________________________   FINAL CLINICAL IMPRESSION(S) / ED DIAGNOSES  Diabetic ketoacidosis    Minna Antis, MD 02/03/17 1153

## 2017-02-03 NOTE — ED Triage Notes (Signed)
Patient arrived by EMS from home for hyperglycemia. Patient reports running out of insulin last night and never went to pick up his new supply. C/o generalized pain all over and nausea. Blood sugar with EMS 519

## 2017-02-03 NOTE — Care Management (Addendum)
This RNCM has notified [facility] and Medication Management of patient's return to the hospital.  Resources have been made in the past and patient continues to come back to the hospital in DKA. Per last note: second or third glucometer delivered to patient; [facility] contracted with patient for patient compliance; and he was living on the streets with his father. Concern was brought forward that patient may be prostituted for money however when interviewed patient denied and desired to return to his POL. Patient was homeless on last admission as [facility] to behavior. No drug screen done on this admission. Apparently patient was a no show in April to [facility].

## 2017-02-03 NOTE — H&P (Signed)
Trevor Brown is an 26 y.o. male.   Chief Complaint: Nausea vomiting HPI: This is a 26 year old male who is well known to our service. He says he ran out of his insulin yesterday and this morning became nauseated. Comes in the ER and found to be in DKA. He denies any other complaints.  Past Medical History:  Diagnosis Date  . Diabetes mellitus without complication ([place])   . Heart attack ([place])    Pt claims he had heart attack a year ago ( 2015)- and was admitted in [facility] for that, but not given any meds or angiogram, on review of chart- I could not find any details like that.  . Hepatitis C, acute may 2016  . Hypertension     History reviewed. No pertinent surgical history.  Family History  Problem Relation Age of Onset  . Cirrhosis Mother   . Diabetes Mellitus II Maternal Grandmother    Social History:  reports that he has been smoking Cigarettes.  He has been smoking about 2.00 packs per day. He has never used smokeless tobacco. He reports that he uses drugs, including IV. He reports that he does not drink alcohol.  Allergies:  Allergies  Allergen Reactions  . Bee Venom Anaphylaxis  . Penicillins Anaphylaxis, Hives and Other (See Comments)    Has patient had a PCN reaction causing immediate rash, facial/tongue/throat swelling, SOB or lightheadedness with hypotension: Yes Has patient had a PCN reaction causing severe rash involving mucus membranes or skin necrosis: No Has patient had a PCN reaction that required hospitalization No Has patient had a PCN reaction occurring within the last 10 years: Yes If all of the above answers are "NO", then may proceed with Cephalosporin use.  . Ibuprofen Other (See Comments)    Reaction:  GI upset   . Tramadol Hives and Swelling  . Vancomycin Rash and Other (See Comments)    Reaction:  Red man's syndrome      (Not in a hospital admission)  Results for orders placed or performed during the hospital encounter of 02/03/17 (from the past 48  hour(s))  Basic metabolic panel     Status: Abnormal   Collection Time: 02/03/17  9:44 AM  Result Value Ref Range   Sodium 134 (L) 135 - 145 mmol/L    Comment: ELECTROLYTES REPEATED. QSD   Potassium 5.3 (H) 3.5 - 5.1 mmol/L    Comment: HEMOLYSIS AT THIS LEVEL MAY AFFECT RESULT   Chloride 97 (L) 101 - 111 mmol/L   CO2 15 (L) 22 - 32 mmol/L   Glucose, Bld 546 (HH) 65 - 99 mg/dL    Comment: CRITICAL RESULT CALLED TO, READ BACK BY AND VERIFIED WITH AMBER JONES ON 02/03/17 AT 1040 QSD    BUN 23 (H) 6 - 20 mg/dL   Creatinine, Ser 1.19 0.61 - 1.24 mg/dL   Calcium 9.7 8.9 - 10.3 mg/dL   GFR calc non Af Amer >60 >60 mL/min   GFR calc Af Amer >60 >60 mL/min    Comment: (NOTE) The eGFR has been calculated using the CKD EPI equation. This calculation has not been validated in all clinical situations. eGFR's persistently <60 mL/min signify possible Chronic Kidney Disease.    Anion gap 22 (H) 5 - 15  CBC     Status: None   Collection Time: 02/03/17  9:44 AM  Result Value Ref Range   WBC 8.9 3.8 - 10.6 K/uL   RBC 5.05 4.40 - 5.90 MIL/uL   Hemoglobin 15.1  13.0 - 18.0 g/dL   HCT 45.4 40.0 - 52.0 %   MCV 89.8 80.0 - 100.0 fL   MCH 29.9 26.0 - 34.0 pg   MCHC 33.3 32.0 - 36.0 g/dL   RDW 14.5 11.5 - 14.5 %   Platelets 222 150 - 440 K/uL  Blood gas, venous     Status: Abnormal (Preliminary result)   Collection Time: 02/03/17  9:45 AM  Result Value Ref Range   pH, Ven 7.21 (L) 7.250 - 7.430   pCO2, Ven 37 (L) 44.0 - 60.0 mmHg   pO2, Ven PENDING 32.0 - 45.0 mmHg   Bicarbonate 14.8 (L) 20.0 - 28.0 mmol/L   Acid-base deficit 12.3 (H) 0.0 - 2.0 mmol/L   Patient temperature 37.0    Collection site VEIN    Sample type VENOUS   Glucose, capillary     Status: Abnormal   Collection Time: 02/03/17 10:29 AM  Result Value Ref Range   Glucose-Capillary 487 (H) 65 - 99 mg/dL   Comment 1 Notify RN    Comment 2 Document in Chart   Glucose, capillary     Status: Abnormal   Collection Time: 02/03/17  11:38 AM  Result Value Ref Range   Glucose-Capillary 424 (H) 65 - 99 mg/dL   No results found.  Review of Systems  Constitutional: Negative for chills and fever.  HENT: Negative for hearing loss.   Eyes: Negative for blurred vision.  Respiratory: Negative for shortness of breath.   Cardiovascular: Negative for chest pain.  Gastrointestinal: Positive for nausea and vomiting.  Genitourinary: Negative for dysuria.  Musculoskeletal: Positive for joint pain.  Skin: Negative for rash.  Neurological: Negative for dizziness.    Blood pressure 126/70, pulse 88, temperature 97.7 F (36.5 C), temperature source Oral, resp. rate 16, height _0  (1.626 m), weight 59 kg (130 lb), SpO2 100 %. Physical Exam  Constitutional: He is oriented to person, place, and time. He appears well-developed and well-nourished. He appears distressed.  HENT:  Head: Normocephalic and atraumatic.  Mouth/Throat: No oropharyngeal exudate.  Oral mucosa dry. Very poor dentition throughout  Eyes: Pupils are equal, round, and reactive to light. EOM are normal. No scleral icterus.  Neck: Neck supple. No JVD present. No thyromegaly present.  Cardiovascular: Normal rate and regular rhythm.   No murmur heard. Respiratory: Breath sounds normal. No respiratory distress. He exhibits no tenderness.  GI: Soft. Bowel sounds are normal. There is no tenderness.  Musculoskeletal: He exhibits no edema or tenderness.  Lymphadenopathy:    He has no cervical adenopathy.  Neurological: He is alert and oriented to person, place, and time. No cranial nerve deficit.  Skin: Skin is warm and dry.     Assessment/Plan 1. Diabetic ketoacidosis. Patient's being treated with IV fluids aggressively and placed on an insulin drip. He was going to the stepdown unit for close observation and monitoring of electrolytes and his acidosis. We'll check frequent BMP until anion gap closes. We'll also replenish electrolytes as needed. 2. Hyper kalemia.  Likely secondary to the acidosis. This should correct out as he acidosis corrects. We'll be monitoring frequent electrolytes. 3. Diabetes. When he is out of DKA will restart his 7030 insulin from home and adjust as needed. 4. Hypertension. He has as listed in his past medical history. Currently is normotensive and is not on any medications. We'll monitor for now  Total critical care time spent 45 minutes  Baxter Hire, MD 02/03/2017, 12:27 PM

## 2017-02-03 NOTE — Progress Notes (Signed)
MEDICATION RELATED CONSULT NOTE    Pharmacy Consult for electrolyte management   Pharmacy consulted for electrolyte management for 26 yo male admitted with DKA.   Plan:  No replacement warranted. Will recheck electrolytes with am labs. Will continue to monitor and adjust per consult.   Allergies  Allergen Reactions  . Bee Venom Anaphylaxis  . Penicillins Anaphylaxis, Hives and Other (See Comments)    Has patient had a PCN reaction causing immediate rash, facial/tongue/throat swelling, SOB or lightheadedness with hypotension: Yes Has patient had a PCN reaction causing severe rash involving mucus membranes or skin necrosis: No Has patient had a PCN reaction that required hospitalization No Has patient had a PCN reaction occurring within the last 10 years: Yes If all of the above answers are "NO", then may proceed with Cephalosporin use.  . Ibuprofen Other (See Comments)    Reaction:  GI upset   . Tramadol Hives and Swelling  . Vancomycin Rash and Other (See Comments)    Reaction:  Red man's syndrome     Patient Measurements: Height: 5\' 4"  (162.6 cm) Weight: 130 lb (59 kg) IBW/kg (Calculated) : 59.2  Vital Signs: Temp: 98.8 F (37.1 C) (07/16 1415) Temp Source: Oral (07/16 1415) BP: 117/60 (07/16 1415) Pulse Rate: 79 (07/16 1415) Intake/Output from previous day: No intake/output data recorded. Intake/Output from this shift: Total I/O In: 2007.7 [I.V.:7.7; IV Piggyback:2000] Out: -   Labs:  Recent Labs  02/03/17 0944 02/03/17 1409  WBC 8.9 8.6  HGB 15.1 12.8*  HCT 45.4 38.7*  PLT 222 227  CREATININE 1.19 1.04   Estimated Creatinine Clearance: 91.4 mL/min (by C-G formula based on SCr of 1.04 mg/dL).   Pharmacy will continue to monitor and adjust per consult.   Simpson,Michael L 02/03/2017,4:59 PM

## 2017-02-04 ENCOUNTER — Telehealth: Payer: Self-pay | Admitting: Pharmacy Technician

## 2017-02-04 LAB — GLUCOSE, CAPILLARY
Glucose-Capillary: 223 mg/dL — ABNORMAL HIGH (ref 65–99)
Glucose-Capillary: 266 mg/dL — ABNORMAL HIGH (ref 65–99)
Glucose-Capillary: 310 mg/dL — ABNORMAL HIGH (ref 65–99)
Glucose-Capillary: 313 mg/dL — ABNORMAL HIGH (ref 65–99)
Glucose-Capillary: 314 mg/dL — ABNORMAL HIGH (ref 65–99)
Glucose-Capillary: 420 mg/dL — ABNORMAL HIGH (ref 65–99)

## 2017-02-04 LAB — BASIC METABOLIC PANEL
Anion gap: 8 (ref 5–15)
BUN: 12 mg/dL (ref 6–20)
CO2: 21 mmol/L — ABNORMAL LOW (ref 22–32)
Calcium: 8.6 mg/dL — ABNORMAL LOW (ref 8.9–10.3)
Chloride: 106 mmol/L (ref 101–111)
Creatinine, Ser: 0.76 mg/dL (ref 0.61–1.24)
GFR calc Af Amer: >60 mL/min (ref >60)
GFR calc non Af Amer: >60 mL/min (ref >60)
Glucose, Bld: 274 mg/dL — ABNORMAL HIGH (ref 65–99)
Potassium: 4 mmol/L (ref 3.5–5.1)
Sodium: 135 mmol/L (ref 135–145)

## 2017-02-04 LAB — CBC
HCT: 36.5 % — ABNORMAL LOW (ref 40.0–52.0)
Hemoglobin: 12.5 g/dL — ABNORMAL LOW (ref 13.0–18.0)
MCH: 30.3 pg (ref 26.0–34.0)
MCHC: 34.3 g/dL (ref 32.0–36.0)
MCV: 88.4 fL (ref 80.0–100.0)
Platelets: 232 10*3/uL (ref 150–440)
RBC: 4.13 MIL/uL — ABNORMAL LOW (ref 4.40–5.90)
RDW: 13.9 % (ref 11.5–14.5)
WBC: 8.1 10*3/uL (ref 3.8–10.6)

## 2017-02-04 MED ORDER — PANTOPRAZOLE SODIUM 40 MG PO TBEC: 40.0000 mg | DELAYED_RELEASE_TABLET | Freq: Every day | ORAL | 0 refills | Status: DC

## 2017-02-04 MED ORDER — INSULIN ASPART 100 UNIT/ML ~~LOC~~ SOLN: 0.0000 [IU] | Freq: Every day | SUBCUTANEOUS | Status: DC

## 2017-02-04 MED ORDER — INSULIN ASPART 100 UNIT/ML ~~LOC~~ SOLN
0.0000 [IU] | Freq: Three times a day (TID) | SUBCUTANEOUS | Status: DC
  Administered 2017-02-04: 20 [IU] via SUBCUTANEOUS
  Filled 2017-02-04: qty 1

## 2017-02-04 MED ORDER — INSULIN ASPART PROT & ASPART (70-30 MIX) 100 UNIT/ML ~~LOC~~ SUSP: 30.0000 [IU] | Freq: Two times a day (BID) | SUBCUTANEOUS | Status: DC

## 2017-02-04 MED ORDER — INSULIN ASPART PROT & ASPART (70-30 MIX) 100 UNIT/ML ~~LOC~~ SUSP: 30.0000 [IU] | Freq: Two times a day (BID) | SUBCUTANEOUS | 3 refills | Status: DC

## 2017-02-04 MED ORDER — INSULIN ASPART PROT & ASPART (70-30 MIX) 100 UNIT/ML ~~LOC~~ SUSP: 30.0000 [IU] | Freq: Two times a day (BID) | SUBCUTANEOUS | 1 refills | Status: DC

## 2017-02-04 MED ORDER — INSULIN ASPART 100 UNIT/ML ~~LOC~~ SOLN
0.0000 [IU] | Freq: Three times a day (TID) | SUBCUTANEOUS | Status: DC
  Administered 2017-02-04: 7 [IU] via SUBCUTANEOUS
  Filled 2017-02-04 (×2): qty 1

## 2017-02-04 MED ORDER — PANTOPRAZOLE SODIUM 40 MG PO TBEC: 40.0000 mg | DELAYED_RELEASE_TABLET | Freq: Every day | ORAL | Status: DC

## 2017-02-04 NOTE — Care Management (Signed)
Meds called to Medication [facility] so they can be ready when patient arrives. No other RNCM needs. [facility] will try again to contract with patient.

## 2017-02-04 NOTE — Telephone Encounter (Signed)
Patient eligible to receive medication assistance at [facility] through 2018, as long as eligibility requirements continue to be met.  Made referral to [facility].  Completed Novolog 15/61 PAP application & Novolog PAP application.  Will forward to [facility] for signature.  Once signed by provider, will be submitted to [employer].  [facility]

## 2017-02-04 NOTE — Progress Notes (Signed)
MEDICATION RELATED CONSULT NOTE    Pharmacy Consult for electrolyte management   Pharmacy consulted for electrolyte management for 26 yo male admitted with DKA.   Plan:  No replacement warranted. Will recheck electrolytes with am labs. Will continue to monitor and adjust per consult.   Allergies  Allergen Reactions  . Bee Venom Anaphylaxis  . Penicillins Anaphylaxis, Hives and Other (See Comments)    Has patient had a PCN reaction causing immediate rash, facial/tongue/throat swelling, SOB or lightheadedness with hypotension: Yes Has patient had a PCN reaction causing severe rash involving mucus membranes or skin necrosis: No Has patient had a PCN reaction that required hospitalization No Has patient had a PCN reaction occurring within the last 10 years: Yes If all of the above answers are "NO", then may proceed with Cephalosporin use.  . Ibuprofen Other (See Comments)    Reaction:  GI upset   . Tramadol Hives and Swelling  . Vancomycin Rash and Other (See Comments)    Reaction:  Red man's syndrome     Patient Measurements: Height: 5\' 4"  (162.6 cm) Weight: 130 lb (59 kg) IBW/kg (Calculated) : 59.2  Vital Signs: Temp: 98.8 F (37.1 C) (07/17 0800) Temp Source: Oral (07/17 0800) BP: 115/58 (07/17 0900) Pulse Rate: 73 (07/17 0900) Intake/Output from previous day: 07/16 0701 - 07/17 0700 In: 2886.5 [P.O.:480; I.V.:406.5; IV Piggyback:2000] Out: 1750 [Urine:1750] Intake/Output from this shift: No intake/output data recorded.  Labs:  Recent Labs  02/03/17 0944 02/03/17 1409 02/03/17 1719 02/03/17 2120 02/04/17 0353  WBC 8.9 8.6  --   --  8.1  HGB 15.1 12.8*  --   --  12.5*  HCT 45.4 38.7*  --   --  36.5*  PLT 222 227  --   --  232  CREATININE 1.19 1.04 0.79 0.67 0.76   Estimated Creatinine Clearance: 118.8 mL/min (by C-G formula based on SCr of 0.76 mg/dL).   Sodium (mmol/L)  Date Value  02/04/2017 135  12/31/2016 138  04/06/2014 137   Potassium (mmol/L)   Date Value  02/04/2017 4.0  04/06/2014 3.8   Magnesium (mg/dL)  Date Value  16/10/960402/18/2018 1.8  12/12/2013 1.6 (L)   Calcium (mg/dL)  Date Value  54/09/811907/17/2018 8.6 (L)   Calcium, Total (mg/dL)  Date Value  14/78/295609/16/2015 8.1 (L)   Albumin (g/dL)  Date Value  21/30/865706/06/2017 4.3  04/05/2014 3.4   Phosphorus (mg/dL)  Date Value  84/69/629502/18/2018 3.6  12/10/2013 2.6     Pharmacy will continue to monitor and adjust per consult.   Luisa Harthristy, Cathyrn Deas D 02/04/2017,11:19 AM

## 2017-02-04 NOTE — Progress Notes (Signed)
Patient discharged by Charline BillsStaci Collie, RN.

## 2017-02-04 NOTE — Discharge Instructions (Signed)
[facility] [place] at [facility]  DIET:  Diabetic diet  DISCHARGE CONDITION:  Stable  ACTIVITY:  Activity as tolerated  OXYGEN:  Home Oxygen: No.   Oxygen Delivery: room air  DISCHARGE LOCATION:  home    ADDITIONAL DISCHARGE INSTRUCTION: take insulin as prescribed   If you experience worsening of your admission symptoms, develop shortness of breath, life threatening emergency, suicidal or homicidal thoughts you must seek medical attention immediately by calling 911 or calling your MD immediately  if symptoms less severe.  You Must read complete instructions/literature along with all the possible adverse reactions/side effects for all the Medicines you take and that have been prescribed to you. Take any new Medicines after you have completely understood and accpet all the possible adverse reactions/side effects.   Please note  You were cared for by a hospitalist during your hospital stay. If you have any questions about your discharge medications or the care you received while you were in the hospital after you are discharged, you can call the unit and asked to speak with the hospitalist on call if the hospitalist that took care of you is not available. Once you are discharged, your primary care physician will handle any further medical issues. Please note that NO REFILLS for any discharge medications will be authorized once you are discharged, as it is imperative that you return to your primary care physician (or establish a relationship with a primary care physician if you do not have one) for your aftercare needs so that they can reassess your need for medications and monitor your lab values.

## 2017-02-04 NOTE — Discharge Summary (Signed)
[facility] - [place] at [facility]  Trevor Brown, [place] y.o., DOB 02/09/1991, MRN [id number]. Admission date: 02/03/2017 Discharge Date 02/04/2017 Primary MD Patient, No Pcp Per Admitting Physician Gracelyn Nurse, MD  Admission Diagnosis  Diabetic ketoacidosis without coma associated with type 1 diabetes mellitus (HCC) [E10.10]  Discharge Diagnosis   Active Problems:   DKA (diabetic ketoacidoses) (HCC)  Medical noncompliance Drug use Hepatitis C Essential hypertension       Hospital Course This is a 26 year old male who is well known to our service. He says he ran out of his insulin yesterday and this morning became nauseated. Comes in the ER and found to be in DKA. Patient has a recurrent presentation and known to our service for same complaints. He was admitted and started on aggressive IV fluids as well as insulin drip. His blood sugars were checked every hour. Once his anion gap normalized he was switched back to his regular insulin. Patient currently complains of some abdominal discomfort. I started him on some PPIs.             Consults  None  Significant Tests:  See full reports for all details     No results found.     Today   Subjective:   Trevor Brown  issue feels well denies any chest pain or shortness of breath  Objective:   Blood pressure (!) 124/96, pulse 85, temperature 98.8 F (37.1 C), temperature source Oral, resp. rate (!) 24, height 5\' 4"  (1.626 m), weight 130 lb (59 kg), SpO2 100 %.  .  Intake/Output Summary (Last 24 hours) at 02/04/17 1529 Last data filed at 02/04/17 1149  Gross per 24 hour  Intake           878.82 ml  Output             3750 ml  Net         -2871.18 ml    Exam VITAL SIGNS: Blood pressure (!) 124/96, pulse 85, temperature 98.8 F (37.1 C), temperature source Oral, resp. rate (!) 24, height 5\' 4"  (1.626 m), weight 130 lb (59 kg), SpO2 100 %.  GENERAL:  26 y.o.-year-old patient lying in the bed with no  acute distress.  EYES: Pupils equal, round, reactive to light and accommodation. No scleral icterus. Extraocular muscles intact.  HEENT: Head atraumatic, normocephalic. Oropharynx and nasopharynx clear.  NECK:  Supple, no jugular venous distention. No thyroid enlargement, no tenderness.  LUNGS: Normal breath sounds bilaterally, no wheezing, rales,rhonchi or crepitation. No use of accessory muscles of respiration.  CARDIOVASCULAR: S1, S2 normal. No murmurs, rubs, or gallops.  ABDOMEN: Soft, nontender, nondistended. Bowel sounds present. No organomegaly or mass.  EXTREMITIES: No pedal edema, cyanosis, or clubbing.  NEUROLOGIC: Cranial nerves II through XII are intact. Muscle strength 5/5 in all extremities. Sensation intact. Gait not checked.  PSYCHIATRIC: The patient is alert and oriented x 3.  SKIN: No obvious rash, lesion, or ulcer.   Data Review     CBC w Diff: Lab Results  Component Value Date   WBC 8.1 02/04/2017   HGB 12.5 (L) 02/04/2017   HGB 13.1 04/06/2014   HCT 36.5 (L) 02/04/2017   HCT 38.7 (L) 04/06/2014   PLT 232 02/04/2017   PLT 157 04/06/2014   LYMPHOPCT 20 09/07/2016   LYMPHOPCT 19.7 04/06/2014   MONOPCT 6 09/07/2016   MONOPCT 9.3 04/06/2014   EOSPCT 1 09/07/2016   EOSPCT 0.9 04/06/2014   BASOPCT 0 09/07/2016  BASOPCT 0.3 04/06/2014   CMP: Lab Results  Component Value Date   NA 135 02/04/2017   NA 138 12/31/2016   NA 137 04/06/2014   K 4.0 02/04/2017   K 3.8 04/06/2014   CL 106 02/04/2017   CL 105 04/06/2014   CO2 21 (L) 02/04/2017   CO2 25 04/06/2014   BUN 12 02/04/2017   BUN 18 12/31/2016   BUN 13 04/06/2014   CREATININE 0.76 02/04/2017   CREATININE 0.64 04/06/2014   PROT 6.9 12/31/2016   PROT 7.3 04/05/2014   ALBUMIN 4.3 12/31/2016   ALBUMIN 3.4 04/05/2014   BILITOT <0.2 12/31/2016   BILITOT 0.3 04/05/2014   ALKPHOS 227 (H) 12/31/2016   ALKPHOS 196 (H) 04/05/2014   AST 35 12/31/2016   AST 24 04/05/2014   ALT 30 12/31/2016   ALT 34  04/05/2014  .  Micro Results Recent Results (from the past 240 hour(s))  MRSA PCR Screening     Status: None   Collection Time: 02/03/17  2:10 PM  Result Value Ref Range Status   MRSA by PCR NEGATIVE NEGATIVE Final    Comment:        The GeneXpert MRSA Assay (FDA approved for NASAL specimens only), is one component of a comprehensive MRSA colonization surveillance program. It is not intended to diagnose MRSA infection nor to guide or monitor treatment for MRSA infections.         Code Status Orders        Start     Ordered   02/03/17 1337  Full code  Continuous     02/03/17 1336    Code Status History    Date Active Date Inactive Code Status Order ID Comments User Context   10/13/2016  5:26 AM 10/15/2016  6:12 PM Full Code [id number]  Arnaldo Natal, MD Inpatient   09/08/2016  3:52 AM 09/08/2016  6:37 PM Full Code [id number]  Hugelmeyer, Alexis, DO Inpatient   08/12/2016 10:20 PM 08/13/2016  5:10 PM Full Code [id number]  Katharina Caper, MD Inpatient   07/04/2016 12:24 PM 07/04/2016 12:24 PM Full Code [id number]  Altamese Dilling, MD Inpatient   07/04/2016 12:24 PM 07/05/2016  7:36 PM Full Code [id number]  Altamese Dilling, MD Inpatient   04/09/2016  4:54 PM 04/11/2016  2:09 PM Full Code [id number]  Katharina Caper, MD Inpatient   03/26/2016  1:18 AM 03/27/2016  7:11 PM Full Code [id number]  Hugelmeyer, Alexis, DO Inpatient   03/18/2016  8:15 AM 03/21/2016  8:35 PM Full Code [id number]  Wyatt Haste, MD ED   03/12/2016 12:20 PM 03/16/2016  7:27 PM Full Code [id number]  Enedina Finner, MD Inpatient   02/18/2016  4:59 PM 02/19/2016  5:18 PM Full Code [id number]  Ramonita Lab, MD ED   01/12/2016  8:58 AM 01/15/2016  7:33 PM Full Code [id number]  Milagros Loll, MD ED   01/11/2016 12:35 AM 01/11/2016 11:20 PM Full Code [id number]  Gery Pray, MD Inpatient   12/12/2015 10:04 PM 12/14/2015  6:38 PM Full Code [id number]  Katharina Caper, MD Inpatient   10/23/2015  5:42 PM 10/25/2015  7:04 PM Full  Code [id number]  Katha Hamming, MD ED   06/10/2015  8:20 AM 06/12/2015  2:41 PM Full Code [id number]  Arnaldo Natal, MD Inpatient   06/06/2015  8:43 PM 06/07/2015  4:20 PM Full Code [id number]  Enedina Finner, MD Inpatient   04/07/2015 12:45 PM 04/08/2015  8:37 PM Full Code [id number]  Altamese Dilling, MD Inpatient  12/09/2014  2:44 PM 12/13/2014  4:16 PM Full Code [id number]  Gale JourneyWalsh, Catherine P, MD Inpatient          Follow-up Information    [facility] Follow [facility] in 1 week(s).           Discharge Medications   Allergies as of 02/04/2017      Reactions   Bee Venom Anaphylaxis   Penicillins Anaphylaxis, Hives, Other (See Comments)   Has patient had a PCN reaction causing immediate rash, facial/tongue/throat swelling, SOB or lightheadedness with hypotension: Yes Has patient had a PCN reaction causing severe rash involving mucus membranes or skin necrosis: No Has patient had a PCN reaction that required hospitalization No Has patient had a PCN reaction occurring within the last 10 years: Yes If all of the above answers are "NO", then may proceed with Cephalosporin use.   Ibuprofen Other (See Comments)   Reaction:  GI upset    Tramadol Hives, Swelling   Vancomycin Rash, Other (See Comments)   Reaction:  Red man's syndrome       Medication List    STOP taking these medications   clindamycin 150 MG capsule Commonly known as:  CLEOCIN     TAKE these medications   acyclovir 400 MG tablet Commonly known as:  ZOVIRAX Take 1 tablet (400 mg total) by mouth 5 (five) times daily.   insulin aspart 100 UNIT/ML injection Commonly known as:  novoLOG Give subcutaneously before meals, 10-20 units each dose   insulin aspart protamine- aspart (70-30) 100 UNIT/ML injection Commonly known as:  NOVOLOG MIX 70/30 Inject 0.3 mLs (30 Units total) into the skin 2 (two) times daily.   naproxen 375 MG tablet Commonly known as:  NAPROSYN Take 1 tablet (375 mg total) by mouth 2  (two) times daily with a meal. If needed for mouth pain   pantoprazole 40 MG tablet Commonly known as:  PROTONIX Take 1 tablet (40 mg total) by mouth daily.          Total Time in preparing paper work, data evaluation and todays exam - 35 minutes  Auburn BilberryPATEL, Mirren Gest M.D on 02/04/2017 at 3:29 PM  [facility]   Office  [phone]

## 2017-02-04 NOTE — Progress Notes (Addendum)
Inpatient Diabetes Program Recommendations  AACE/ADA: New Consensus Statement on Inpatient Glycemic Control (2015)  Target Ranges:  Prepandial:   less than 140 mg/dL      Peak postprandial:   less than 180 mg/dL (1-2 hours)      Critically ill patients:  140 - 180 mg/dL   Lab Results  Component Value Date   GLUCAP 266 (H) 02/04/2017   HGBA1C 10.2 (H) 12/31/2016    Review of Glycemic Control  Results for [name], Trevor Brown (MRN [id number]) as of 02/04/2017 07:13  Ref. Range 02/03/2017 20:32 02/03/2017 21:33 02/03/2017 22:59 02/04/2017 00:02 02/04/2017 04:10  Glucose-Capillary Latest Ref Range: 65 - 99 mg/dL 086127 (H) 82 578106 (H) 469223 (H) 266 (H)    Diabetes history: Type 1 Outpatient Diabetes medications: Novolog mix 70/30 30 units bid, Novolog 10-20 units before meals Current orders for Inpatient glycemic control: Novolog 0-9 units tid, Novolog mix 70/30 30 units bid, Novolog 0-5 units qhs  Inpatient Diabetes Program Recommendations:  Patient received 12 units of Lantus insulin at 2214.  This insulin will work for approximately 24 hours.  Patient is ordered the full dose of  70/30 insulin taken at home to begin this morning- I am concerned that with the Lantus on board, the patient is at risk of hypoglycemia   Consider giving Novolog 10 units tid with breakfast and lunch then discontinue and start 30 units 70/30 insulin at supper and bid.  Continue Novolog correction as ordered.   Susette RacerJulie Naavya Postma, RN, BA, MHA, CDE Diabetes Coordinator Inpatient Diabetes Program  [phone] (Team Pager) [phone] [facility] Office) 02/04/2017 7:25 AM

## 2017-02-04 NOTE — Care Management (Signed)
RNCM received reply from Lorie at [facility] that patient came to one appointment at their clinic. He has exhausted all means delivered to him except that (we) physically take him to his appointments. The conversation has been had (after determining that patient is able to follow through) regarding risks of death, risks of amputations, risk of heart disease, and multiple other issues at his young age if he does not follow through- he acknowledges every time.

## 2017-02-10 LAB — BLOOD GAS, VENOUS
Acid-base deficit: 12.3 mmol/L — ABNORMAL HIGH (ref 0.0–2.0)
Bicarbonate: 14.8 mmol/L — ABNORMAL LOW (ref 20.0–28.0)
Patient temperature: 37
pCO2, Ven: 37 mmHg — ABNORMAL LOW (ref 44.0–60.0)
pH, Ven: 7.21 — ABNORMAL LOW (ref 7.250–7.430)

## 2017-02-13 ENCOUNTER — Ambulatory Visit: Payer: Self-pay | Admitting: Gastroenterology

## 2017-02-17 ENCOUNTER — Encounter: Payer: Self-pay | Admitting: Gastroenterology

## 2017-02-17 ENCOUNTER — Ambulatory Visit: Payer: Self-pay | Admitting: Gastroenterology

## 2017-03-06 ENCOUNTER — Telehealth: Payer: Self-pay

## 2017-03-06 NOTE — Telephone Encounter (Signed)
Placed signed application/script in [facility] folder for pickup. 

## 2017-03-06 NOTE — Telephone Encounter (Signed)
[facility] placed for provider to sign.

## 2017-03-14 ENCOUNTER — Emergency Department
Admission: EM | Admit: 2017-03-14 | Discharge: 2017-03-14 | Disposition: A | Discharge status: Home / Self Care | Payer: Medicaid Other | Attending: Emergency Medicine | Admitting: Emergency Medicine

## 2017-03-14 ENCOUNTER — Encounter: Payer: Self-pay | Admitting: *Deleted

## 2017-03-14 DIAGNOSIS — IMO0001 Reserved for inherently not codable concepts without codable children: Secondary | ICD-10-CM

## 2017-03-14 DIAGNOSIS — Z79899 Other long term (current) drug therapy: Secondary | ICD-10-CM | POA: Insufficient documentation

## 2017-03-14 DIAGNOSIS — Z760 Encounter for issue of repeat prescription: Secondary | ICD-10-CM | POA: Insufficient documentation

## 2017-03-14 DIAGNOSIS — I10 Essential (primary) hypertension: Secondary | ICD-10-CM | POA: Insufficient documentation

## 2017-03-14 DIAGNOSIS — E1065 Type 1 diabetes mellitus with hyperglycemia: Secondary | ICD-10-CM

## 2017-03-14 DIAGNOSIS — F17210 Nicotine dependence, cigarettes, uncomplicated: Secondary | ICD-10-CM | POA: Insufficient documentation

## 2017-03-14 DIAGNOSIS — Z794 Long term (current) use of insulin: Secondary | ICD-10-CM | POA: Insufficient documentation

## 2017-03-14 DIAGNOSIS — E109 Type 1 diabetes mellitus without complications: Secondary | ICD-10-CM | POA: Insufficient documentation

## 2017-03-14 MED ORDER — INSULIN ASPART 100 UNIT/ML ~~LOC~~ SOLN: SUBCUTANEOUS | 1 refills | Status: DC

## 2017-03-14 MED ORDER — INSULIN ASPART PROT & ASPART (70-30 MIX) 100 UNIT/ML ~~LOC~~ SUSP
30.0000 [IU] | Freq: Two times a day (BID) | SUBCUTANEOUS | 1 refills | Status: DC
Start: 2017-03-14 — End: 2017-04-21

## 2017-03-14 NOTE — ED Notes (Signed)
Blood sugar 325 per EMS

## 2017-03-14 NOTE — Discharge Instructions (Signed)
Reschedule appointment with [facility].

## 2017-03-14 NOTE — ED Notes (Signed)
See triage note  Brought in by EMS states he is out of insulin 70/30  Last dose was yesterday  Denies any other sx,s

## 2017-03-14 NOTE — ED Triage Notes (Signed)
Pt ran out of insulin this morning, pt was unable to go to MD appointment due to having to work, pt complains of occasional nausea , denies any other symptoms

## 2017-03-14 NOTE — ED Provider Notes (Signed)
[facility] Emergency Department Provider Note ____________________________________________  Time seen: 8:35 AM  I have reviewed the triage vital signs and the nursing notes.  HISTORY  Chief Complaint  Medication Refill and Nausea   HPI Trevor Brown is a 26 y.o. male is here for refill of insulin. Patient [facility].    Past Medical History:  Diagnosis Date  . Diabetes mellitus without complication (HCC)   . Heart attack (HCC)    Pt claims he had heart attack a year ago ( 2015)- and was admitted in [facility] for that, but not given any meds or angiogram, on review of chart- I could not find any details like that.  . Hepatitis C, acute may 2016  . Hypertension     Patient Active Problem List   Diagnosis Date Noted  . Dental erosion extending into pulp 12/31/2016  . 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 Elevated transaminase level 03/27/2016  . Diabetes mellitus type 1 (HCC) 03/27/2016  . Tobacco abuse counseling 03/27/2016  . Transaminitis 03/25/2016  . Tobacco abuse 01/10/2016  . Type 1 diabetes mellitus with hyperglycemia (HCC) 12/12/2015  . Cocaine abuse 12/12/2015  . DKA (diabetic ketoacidoses) (HCC) 06/10/2015  . Hidradenitis suppurativa of left axilla   . DKA, type 1 (HCC) 06/06/2015  . Malnutrition of moderate degree (HCC) 04/08/2015  . Hepatitis C 12/13/2014  . Diabetes type 1, uncontrolled (HCC) 12/11/2014  . Major depressive disorder, single episode, mild (HCC)   . Major depression, single episode 12/10/2014  . Diabetes mellitus type 1, uncontrolled (HCC) 12/09/2014  . Homelessness 12/09/2014    History reviewed. No pertinent surgical history.  Prior to Admission medications   Medication Sig Start Date End Date Taking? Authorizing Provider  insulin aspart (NOVOLOG) 100 UNIT/ML injection Give subcutaneously before meals, 10-20 units each dose 03/14/17   Bridget Hartshorn L, PA-C  insulin aspart protamine- aspart  (NOVOLOG MIX 70/30) (70-30) 100 UNIT/ML injection Inject 0.3 mLs (30 Units total) into the skin 2 (two) times daily. 03/14/17 04/14/17  Tommi Rumps, PA-C  oxyCODONE (OXY IR/ROXICODONE) 5 MG immediate release tablet Take 5 mg by mouth. 01/02/16   [provider]  pantoprazole (PROTONIX) 40 MG tablet Take 1 tablet (40 mg total) by mouth daily. 02/04/17   Auburn Bilberry, MD    Allergies Bee venom; Penicillins; Ibuprofen; Tramadol; and Vancomycin  Family History  Problem Relation Age of Onset  . Cirrhosis Mother   . Diabetes Mellitus II Maternal Grandmother     Social History Social History  Substance Use Topics  . Smoking status: Current Every Day Smoker    Packs/day: 2.00    Types: Cigarettes  . Smokeless tobacco: Never Used  . Alcohol use No    Review of Systems Constitutional: Negative for fever. Cardiovascular: Negative for chest pain. Respiratory: Negative for shortness of breath. Gastrointestinal: Negative for abdominal pain, Occasional nausea but no vomiting. Neurological: Negative for headaches, focal weakness or numbness. ____________________________________________  PHYSICAL EXAM:  VITAL SIGNS: ED Triage Vitals [03/14/17 0816]  Enc Vitals Group     BP 139/82     Pulse Rate 70     Resp 20     Temp 98.2 F (36.8 C)     Temp Source Oral     SpO2 100 %     Weight 130 lb (59 kg)     Height 5\' 4"  (1.626 m)     Head Circumference      Peak Flow  Pain Score      Pain Loc      Pain Edu?      Excl. in GC?     Constitutional: Alert and oriented. Well appearing and in no distress. Head: Normocephalic and atraumatic. Eyes: Conjunctivae are normal.  Nose: No congestion Neck: No stridor Cardiovascular: Normal rate, regular rhythm. Normal distal pulses. Respiratory: Normal respiratory effort. No wheezes/rales/rhonchi. Gastrointestinal: Soft and nontender. No distention. Musculoskeletal: Nontender with normal range of motion in all extremities.   Neurologic:  Normal gait without ataxia. Normal speech and language. No gross focal neurologic deficits are appreciated. Skin:  Skin is warm, dry and intact. No rash noted. Psychiatric: Mood and affect are normal. Patient exhibits appropriate insight and judgment. ____________________________________________     INITIAL IMPRESSION / ASSESSMENT AND PLAN / ED COURSE  Patient was given a prescription for his insulin with instructions to reschedule [facility] appointment [facility].    ____________________________________________  FINAL CLINICAL IMPRESSION(S) / ED DIAGNOSES  Final diagnoses:  Encounter for medication refill  Uncontrolled type 1 diabetes mellitus without complication [facility])     Tommi Rumps, PA-C 03/14/17 1610    Jeanmarie Plant, MD 03/14/17 1038

## 2017-03-21 ENCOUNTER — Telehealth: Payer: Self-pay | Admitting: Pharmacist

## 2017-03-21 NOTE — Telephone Encounter (Signed)
03/21/2017 Faxed Novo Nordisk application for [employer] Mix 70/30 Inject 30 units under the skin two times a day, # 8 vials (Max daily dose 60 units)    &    Novolog Vial Inject 10-20 units under the skin before each meal, # 8 vials (Max daily dose 60 units).Forde RadonAJ

## 2017-04-17 ENCOUNTER — Encounter: Payer: Self-pay | Admitting: Emergency Medicine

## 2017-04-17 ENCOUNTER — Inpatient Hospital Stay
Admission: EM | Admit: 2017-04-17 | Discharge: 2017-04-21 | DRG: 639 | Disposition: A | Discharge status: Home / Self Care | Payer: Self-pay | Attending: Internal Medicine | Admitting: Internal Medicine

## 2017-04-17 DIAGNOSIS — Z91030 Bee allergy status: Secondary | ICD-10-CM

## 2017-04-17 DIAGNOSIS — Z590 Homelessness: Secondary | ICD-10-CM

## 2017-04-17 DIAGNOSIS — I10 Essential (primary) hypertension: Secondary | ICD-10-CM | POA: Diagnosis present

## 2017-04-17 DIAGNOSIS — Z880 Allergy status to penicillin: Secondary | ICD-10-CM

## 2017-04-17 DIAGNOSIS — E1010 Type 1 diabetes mellitus with ketoacidosis without coma: Principal | ICD-10-CM | POA: Diagnosis present

## 2017-04-17 DIAGNOSIS — R739 Hyperglycemia, unspecified: Secondary | ICD-10-CM

## 2017-04-17 DIAGNOSIS — K047 Periapical abscess without sinus: Secondary | ICD-10-CM | POA: Diagnosis present

## 2017-04-17 DIAGNOSIS — Z8619 Personal history of other infectious and parasitic diseases: Secondary | ICD-10-CM

## 2017-04-17 DIAGNOSIS — T402X5A Adverse effect of other opioids, initial encounter: Secondary | ICD-10-CM | POA: Diagnosis not present

## 2017-04-17 DIAGNOSIS — Z833 Family history of diabetes mellitus: Secondary | ICD-10-CM

## 2017-04-17 DIAGNOSIS — I252 Old myocardial infarction: Secondary | ICD-10-CM

## 2017-04-17 DIAGNOSIS — Z9114 Patient's other noncompliance with medication regimen: Secondary | ICD-10-CM

## 2017-04-17 DIAGNOSIS — E1110 Type 2 diabetes mellitus with ketoacidosis without coma: Secondary | ICD-10-CM | POA: Diagnosis present

## 2017-04-17 DIAGNOSIS — F17210 Nicotine dependence, cigarettes, uncomplicated: Secondary | ICD-10-CM | POA: Diagnosis present

## 2017-04-17 DIAGNOSIS — K5900 Constipation, unspecified: Secondary | ICD-10-CM | POA: Diagnosis not present

## 2017-04-17 DIAGNOSIS — Z881 Allergy status to other antibiotic agents status: Secondary | ICD-10-CM

## 2017-04-17 DIAGNOSIS — Z886 Allergy status to analgesic agent status: Secondary | ICD-10-CM

## 2017-04-17 DIAGNOSIS — Z23 Encounter for immunization: Secondary | ICD-10-CM

## 2017-04-17 DIAGNOSIS — Z885 Allergy status to narcotic agent status: Secondary | ICD-10-CM

## 2017-04-17 LAB — GLUCOSE, CAPILLARY
Glucose-Capillary: >600 mg/dL (ref 65–99)
Glucose-Capillary: >600 mg/dL (ref 65–99)
Glucose-Capillary: >600 mg/dL (ref 65–99)
Glucose-Capillary: >600 mg/dL (ref 65–99)
Glucose-Capillary: 432 mg/dL — ABNORMAL HIGH (ref 65–99)
Glucose-Capillary: 235 mg/dL — ABNORMAL HIGH (ref 65–99)
Glucose-Capillary: 153 mg/dL — ABNORMAL HIGH (ref 65–99)
Glucose-Capillary: 135 mg/dL — ABNORMAL HIGH (ref 65–99)
Glucose-Capillary: 104 mg/dL — ABNORMAL HIGH (ref 65–99)
Glucose-Capillary: 161 mg/dL — ABNORMAL HIGH (ref 65–99)
Glucose-Capillary: 172 mg/dL — ABNORMAL HIGH (ref 65–99)
Glucose-Capillary: 161 mg/dL — ABNORMAL HIGH (ref 65–99)
Glucose-Capillary: 159 mg/dL — ABNORMAL HIGH (ref 65–99)
Glucose-Capillary: 149 mg/dL — ABNORMAL HIGH (ref 65–99)
Glucose-Capillary: 345 mg/dL — ABNORMAL HIGH (ref 65–99)
Glucose-Capillary: 251 mg/dL — ABNORMAL HIGH (ref 65–99)

## 2017-04-17 LAB — BASIC METABOLIC PANEL
Anion gap: 16 — ABNORMAL HIGH (ref 5–15)
Anion gap: 17 — ABNORMAL HIGH (ref 5–15)
Anion gap: 7 (ref 5–15)
Anion gap: 10 (ref 5–15)
Anion gap: 8 (ref 5–15)
BUN: 15 mg/dL (ref 6–20)
BUN: 18 mg/dL (ref 6–20)
BUN: 17 mg/dL (ref 6–20)
BUN: 15 mg/dL (ref 6–20)
BUN: 13 mg/dL (ref 6–20)
CO2: 20 mmol/L — ABNORMAL LOW (ref 22–32)
CO2: 12 mmol/L — ABNORMAL LOW (ref 22–32)
CO2: 23 mmol/L (ref 22–32)
CO2: 18 mmol/L — ABNORMAL LOW (ref 22–32)
CO2: 19 mmol/L — ABNORMAL LOW (ref 22–32)
Calcium: 8.9 mg/dL (ref 8.9–10.3)
Calcium: 8.1 mg/dL — ABNORMAL LOW (ref 8.9–10.3)
Calcium: 8 mg/dL — ABNORMAL LOW (ref 8.9–10.3)
Calcium: 8.1 mg/dL — ABNORMAL LOW (ref 8.9–10.3)
Calcium: 7.8 mg/dL — ABNORMAL LOW (ref 8.9–10.3)
Chloride: 91 mmol/L — ABNORMAL LOW (ref 101–111)
Chloride: 103 mmol/L (ref 101–111)
Chloride: 106 mmol/L (ref 101–111)
Chloride: 108 mmol/L (ref 101–111)
Chloride: 108 mmol/L (ref 101–111)
Creatinine, Ser: 0.98 mg/dL (ref 0.61–1.24)
Creatinine, Ser: 1.11 mg/dL (ref 0.61–1.24)
Creatinine, Ser: 0.71 mg/dL (ref 0.61–1.24)
Creatinine, Ser: 0.55 mg/dL — ABNORMAL LOW (ref 0.61–1.24)
Creatinine, Ser: 0.74 mg/dL (ref 0.61–1.24)
GFR calc Af Amer: >60 mL/min (ref >60)
GFR calc Af Amer: >60 mL/min (ref >60)
GFR calc Af Amer: >60 mL/min (ref >60)
GFR calc Af Amer: >60 mL/min (ref >60)
GFR calc Af Amer: >60 mL/min (ref >60)
GFR calc non Af Amer: >60 mL/min (ref >60)
GFR calc non Af Amer: >60 mL/min (ref >60)
GFR calc non Af Amer: >60 mL/min (ref >60)
GFR calc non Af Amer: >60 mL/min (ref >60)
GFR calc non Af Amer: >60 mL/min (ref >60)
Glucose, Bld: 801 mg/dL (ref 65–99)
Glucose, Bld: 485 mg/dL — ABNORMAL HIGH (ref 65–99)
Glucose, Bld: 140 mg/dL — ABNORMAL HIGH (ref 65–99)
Glucose, Bld: 176 mg/dL — ABNORMAL HIGH (ref 65–99)
Glucose, Bld: 277 mg/dL — ABNORMAL HIGH (ref 65–99)
Potassium: 4.5 mmol/L (ref 3.5–5.1)
Potassium: 3.8 mmol/L (ref 3.5–5.1)
Potassium: 4.1 mmol/L (ref 3.5–5.1)
Potassium: 4.3 mmol/L (ref 3.5–5.1)
Potassium: 3.6 mmol/L (ref 3.5–5.1)
Sodium: 127 mmol/L — ABNORMAL LOW (ref 135–145)
Sodium: 132 mmol/L — ABNORMAL LOW (ref 135–145)
Sodium: 136 mmol/L (ref 135–145)
Sodium: 136 mmol/L (ref 135–145)
Sodium: 135 mmol/L (ref 135–145)

## 2017-04-17 LAB — URINALYSIS, COMPLETE (UACMP) WITH MICROSCOPIC
Bacteria, UA: NONE SEEN
Bilirubin Urine: NEGATIVE
Glucose, UA: >=500 mg/dL — AB
Hgb urine dipstick: NEGATIVE
Ketones, ur: 20 mg/dL — AB
Leukocytes, UA: NEGATIVE
Nitrite: NEGATIVE
Protein, ur: NEGATIVE mg/dL
RBC / HPF: NONE SEEN RBC/hpf (ref 0–5)
Specific Gravity, Urine: 1.025 (ref 1.005–1.030)
WBC, UA: NONE SEEN WBC/hpf (ref 0–5)
pH: 5 (ref 5.0–8.0)

## 2017-04-17 LAB — URINE DRUG SCREEN, QUALITATIVE (ARMC ONLY)
Amphetamines, Ur Screen: NOT DETECTED
Barbiturates, Ur Screen: NOT DETECTED
Benzodiazepine, Ur Scrn: NOT DETECTED
Cannabinoid 50 Ng, Ur ~~LOC~~: NOT DETECTED
Cocaine Metabolite,Ur ~~LOC~~: POSITIVE — AB
MDMA (Ecstasy)Ur Screen: NOT DETECTED
Methadone Scn, Ur: NOT DETECTED
Opiate, Ur Screen: NOT DETECTED
Phencyclidine (PCP) Ur S: NOT DETECTED
Tricyclic, Ur Screen: NOT DETECTED

## 2017-04-17 LAB — CBC WITH DIFFERENTIAL/PLATELET
Basophils Absolute: 0 10*3/uL (ref 0–0.1)
Basophils Relative: 1 %
Eosinophils Absolute: 0.1 10*3/uL (ref 0–0.7)
Eosinophils Relative: 2 %
HCT: 41 % (ref 40.0–52.0)
Hemoglobin: 13.6 g/dL (ref 13.0–18.0)
Lymphocytes Relative: 25 %
Lymphs Abs: 2 10*3/uL (ref 1.0–3.6)
MCH: 30.4 pg (ref 26.0–34.0)
MCHC: 33.1 g/dL (ref 32.0–36.0)
MCV: 91.8 fL (ref 80.0–100.0)
Monocytes Absolute: 0.5 10*3/uL (ref 0.2–1.0)
Monocytes Relative: 6 %
Neutro Abs: 5.4 10*3/uL (ref 1.4–6.5)
Neutrophils Relative %: 66 %
Platelets: 242 10*3/uL (ref 150–440)
RBC: 4.47 MIL/uL (ref 4.40–5.90)
RDW: 14.4 % (ref 11.5–14.5)
WBC: 8.1 10*3/uL (ref 3.8–10.6)

## 2017-04-17 LAB — BLOOD GAS, VENOUS
Acid-base deficit: 6.3 mmol/L — ABNORMAL HIGH (ref 0.0–2.0)
Bicarbonate: 19.1 mmol/L — ABNORMAL LOW (ref 20.0–28.0)
O2 Saturation: 69.8 %
Patient temperature: 37
pCO2, Ven: 37 mmHg — ABNORMAL LOW (ref 44.0–60.0)
pH, Ven: 7.32 (ref 7.250–7.430)
pO2, Ven: 40 mmHg (ref 32.0–45.0)

## 2017-04-17 LAB — BETA-HYDROXYBUTYRIC ACID: Beta-Hydroxybutyric Acid: 4.49 mmol/L — ABNORMAL HIGH (ref 0.05–0.27)

## 2017-04-17 LAB — MRSA PCR SCREENING: MRSA by PCR: POSITIVE — AB

## 2017-04-17 MED ORDER — KETOROLAC TROMETHAMINE 30 MG/ML IJ SOLN
15.0000 mg | Freq: Once | INTRAMUSCULAR | Status: AC
  Administered 2017-04-17: 15 mg via INTRAVENOUS
  Filled 2017-04-17: qty 1

## 2017-04-17 MED ORDER — ENOXAPARIN SODIUM 40 MG/0.4ML ~~LOC~~ SOLN
40.0000 mg | SUBCUTANEOUS | Status: DC
  Administered 2017-04-17: 40 mg via SUBCUTANEOUS
  Filled 2017-04-17 (×3): qty 0.4

## 2017-04-17 MED ORDER — ACETAMINOPHEN 650 MG RE SUPP: 650.0000 mg | Freq: Four times a day (QID) | RECTAL | Status: DC | PRN

## 2017-04-17 MED ORDER — ACETAMINOPHEN 325 MG PO TABS: 650.0000 mg | ORAL_TABLET | Freq: Four times a day (QID) | ORAL | Status: DC | PRN

## 2017-04-17 MED ORDER — ONDANSETRON HCL 4 MG/2ML IJ SOLN: 4.0000 mg | Freq: Four times a day (QID) | INTRAMUSCULAR | Status: DC | PRN

## 2017-04-17 MED ORDER — SODIUM CHLORIDE 0.9 % IV SOLN
INTRAVENOUS | Status: DC
  Administered 2017-04-17 (×2): via INTRAVENOUS

## 2017-04-17 MED ORDER — ONDANSETRON HCL 4 MG/2ML IJ SOLN
4.0000 mg | Freq: Once | INTRAMUSCULAR | Status: AC
  Administered 2017-04-17: 4 mg via INTRAVENOUS
  Filled 2017-04-17: qty 2

## 2017-04-17 MED ORDER — MORPHINE SULFATE (PF) 2 MG/ML IV SOLN
2.0000 mg | INTRAVENOUS | Status: DC | PRN
  Administered 2017-04-17 – 2017-04-19 (×10): 2 mg via INTRAVENOUS
  Filled 2017-04-17 (×10): qty 1

## 2017-04-17 MED ORDER — SODIUM CHLORIDE 0.9 % IV BOLUS (SEPSIS)
1000.0000 mL | Freq: Once | INTRAVENOUS | Status: AC
  Administered 2017-04-17: 1000 mL via INTRAVENOUS

## 2017-04-17 MED ORDER — INFLUENZA VAC SPLIT QUAD 0.5 ML IM SUSY
0.5000 mL | PREFILLED_SYRINGE | INTRAMUSCULAR | Status: AC
  Administered 2017-04-18: 0.5 mL via INTRAMUSCULAR
  Filled 2017-04-17: qty 0.5

## 2017-04-17 MED ORDER — KCL IN DEXTROSE-NACL 20-5-0.45 MEQ/L-%-% IV SOLN
INTRAVENOUS | Status: DC
  Administered 2017-04-17: 14:00:00 via INTRAVENOUS
  Administered 2017-04-18: 125 mL/h via INTRAVENOUS
  Filled 2017-04-17 (×6): qty 1000

## 2017-04-17 MED ORDER — POTASSIUM CHLORIDE 10 MEQ/100ML IV SOLN
10.0000 meq | INTRAVENOUS | Status: AC
  Administered 2017-04-17 (×2): 10 meq via INTRAVENOUS
  Filled 2017-04-17 (×2): qty 100

## 2017-04-17 MED ORDER — SODIUM CHLORIDE 0.9 % IV BOLUS (SEPSIS)
2000.0000 mL | Freq: Once | INTRAVENOUS | Status: AC
Start: 2017-04-17 — End: 2017-04-17
  Administered 2017-04-17: 2000 mL via INTRAVENOUS

## 2017-04-17 MED ORDER — SODIUM CHLORIDE 0.9 % IV SOLN
INTRAVENOUS | Status: DC
  Administered 2017-04-17: 13:00:00 via INTRAVENOUS

## 2017-04-17 MED ORDER — NICOTINE 21 MG/24HR TD PT24
21.0000 mg | MEDICATED_PATCH | Freq: Every day | TRANSDERMAL | Status: DC
  Administered 2017-04-17 – 2017-04-21 (×5): 21 mg via TRANSDERMAL
  Filled 2017-04-17 (×5): qty 1

## 2017-04-17 MED ORDER — PANTOPRAZOLE SODIUM 40 MG IV SOLR
40.0000 mg | INTRAVENOUS | Status: DC
  Administered 2017-04-18: 40 mg via INTRAVENOUS
  Filled 2017-04-17 (×2): qty 40

## 2017-04-17 MED ORDER — PANTOPRAZOLE SODIUM 40 MG IV SOLR
40.0000 mg | Freq: Once | INTRAVENOUS | Status: AC
  Administered 2017-04-17: 40 mg via INTRAVENOUS
  Filled 2017-04-17: qty 40

## 2017-04-17 MED ORDER — MORPHINE SULFATE (PF) 2 MG/ML IV SOLN
1.0000 mg | Freq: Four times a day (QID) | INTRAVENOUS | Status: DC | PRN
  Administered 2017-04-17: 1 mg via INTRAVENOUS
  Filled 2017-04-17: qty 1

## 2017-04-17 MED ORDER — SODIUM CHLORIDE 0.9 % IV SOLN
INTRAVENOUS | Status: DC
  Administered 2017-04-17: 5.4 [IU]/h via INTRAVENOUS
  Filled 2017-04-17 (×2): qty 1

## 2017-04-17 MED ORDER — ONDANSETRON HCL 4 MG PO TABS: 4.0000 mg | ORAL_TABLET | Freq: Four times a day (QID) | ORAL | Status: DC | PRN

## 2017-04-17 NOTE — ED Notes (Signed)
Dr. Scotty Court notified in person of pt blood sugar of 801.

## 2017-04-17 NOTE — Care Management (Addendum)
There is no services left to provide patient. He has been given multiple resources to [facility] but not compliant. Patient has even contracted with both this RNCM and Smiley Houseman with [facility]. He has been given three glucometers from case management funds. He's had Medicaid coverage and failed to comply with it also.

## 2017-04-17 NOTE — Consult Note (Signed)
[facility] Consultation      Assessment and Plan:  Hyperglycemia with DKA. Noncompliance.  H/o HCV  --IV insulin and IVF per protocol.  --Replace electrolytes.  --Case Management consulted re pt's inability to get medications.     Date: 04/17/2017  MRN# [id number] Trevor Brown 1991-02-12  Referring Physician: Dr. Amado Coe for DKA.   Trevor Brown is a 26 y.o. old male seen in consultation for chief complaint of:    Chief Complaint  Patient presents with  . Medication Refill    HPI:   The patient is a 26 yo male with a history of DM, frequent hospital visits due to hyperglycemia related to noncompliance, frequently running out of insulin and not keeping up with his glucose measurements. He presents with complaints of abdominal pain for the past 2-3 days. He ran out of insulin, has been having polyuria, polydipsia. The patient is [facility], but he has missed his appointments. His blood glucose on presentation was 801. Pt c/o pain in mouth/teeth.   Review of lab studies shows a CO2 level of 12, with an anion gap of 17. Creatinine was up to 1.11. Utox positive for cocaine.    PMHX:   Past Medical History:  Diagnosis Date  . Diabetes mellitus without complication (HCC)   . Heart attack (HCC)    Pt claims he had heart attack a year ago ( 2015)- and was admitted in [facility] for that, but not given any meds or angiogram, on review of chart- I could not find any details like that.  . Hepatitis C, acute may 2016  . Hypertension    Surgical Hx:  No past surgical history on file. Family Hx:  Family History  Problem Relation Age of Onset  . Cirrhosis Mother   . Diabetes Mellitus II Maternal Grandmother    Social Hx:   Social History  Substance Use Topics  . Smoking status: Current Every Day Smoker    Packs/day: 2.00    Types: Cigarettes  . Smokeless tobacco: Never Used  . Alcohol use No   Medication:     Current Facility-Administered Medications:  .  [COMPLETED] sodium chloride 0.9 % bolus 1,000 mL, 1,000 mL, Intravenous, Once, Stopped at 04/17/17 1214 **AND** 0.9 %  sodium chloride infusion, , Intravenous, Continuous, Sharman Cheek, MD, Last Rate: 125 mL/hr at 04/17/17 1230 .  0.9 %  sodium chloride infusion, , Intravenous, Continuous, Gouru, Aruna, MD, Last Rate: 150 mL/hr at 04/17/17 1225 .  acetaminophen (TYLENOL) tablet 650 mg, 650 mg, Oral, Q6H PRN **OR** acetaminophen (TYLENOL) suppository 650 mg, 650 mg, Rectal, Q6H PRN, Gouru, Aruna, MD .  dextrose 5 % and 0.45 % NaCl with KCl 20 mEq/L infusion, , Intravenous, Continuous, Gouru, Aruna, MD .  enoxaparin (LOVENOX) injection 40 mg, 40 mg, Subcutaneous, Q24H, Gouru, Aruna, MD .  Melene Muller ON 04/18/2017] Influenza vac split quadrivalent PF (FLUARIX) injection 0.5 mL, 0.5 mL, Intramuscular, Tomorrow-1000, Quintyn Dombek, MD .  insulin regular (NOVOLIN R,HUMULIN R) 100 Units in sodium chloride 0.9 % 100 mL (1 Units/mL) infusion, , Intravenous, Continuous, Sharman Cheek, MD, Last Rate: 3.4 mL/hr at 04/17/17 1320, 3.4 Units/hr at 04/17/17 1320 .  morphine 2 MG/ML injection 1 mg, 1 mg, Intravenous, Q6H PRN, Gouru, Aruna, MD, 1 mg at 04/17/17 1218 .  ondansetron (ZOFRAN) tablet 4 mg, 4 mg, Oral, Q6H PRN **OR** ondansetron (ZOFRAN) injection 4 mg, 4 mg, Intravenous, Q6H PRN, Ramonita Lab, MD .  pantoprazole (PROTONIX) injection 40 mg, 40 mg, Intravenous, Q24H, Gouru, Aruna, MD   Allergies:  Bee venom; Penicillins; Ibuprofen; Tramadol; and Vancomycin  Review of Systems: Gen:  Denies  fever, sweats, chills HEENT: Denies blurred vision, double vision. bleeds, sore throat Cvc:  No dizziness, chest pain. Resp:   Denies cough or sputum production, shortness of breath Gi: Denies swallowing difficulty, stomach pain. Gu:  Denies bladder incontinence, burning urine Ext:   No Joint pain, stiffness. Skin: No skin rash,  hives  Endoc:  No  polyuria, polydipsia. Psych: No depression, insomnia. Other:  All other systems were reviewed with the patient and were negative other that what is mentioned in the HPI.   Physical Examination:   VS: BP 126/76 (BP Location: Left Arm)   Pulse 71   Temp 98 F (36.7 C) (Oral)   Resp (!) 26   Ht  (1.626 m)   Wt 119 lb 4.3 oz (54.1 kg)   SpO2 99%   BMI 20.47 kg/m   General Appearance: No distress  Neuro:without focal findings,  speech normal,  HEENT: PERRLA, EOM intact.  Poor dentition.  Pulmonary: normal breath sounds, No wheezing.  CardiovascularNormal S1,S2.  No m/r/g.   Abdomen: Benign, Soft, non-tender. Renal:  No costovertebral tenderness  GU:  No performed at this time. Endoc: No evident thyromegaly, no signs of acromegaly. Skin:   warm, no rashes, no ecchymosis  Extremities: normal, no cyanosis, clubbing.  Other findings:    LABORATORY PANEL:   CBC  Recent Labs Lab 04/17/17 0725  WBC 8.1  HGB 13.6  HCT 41.0  PLT 242   ------------------------------------------------------------------------------------------------------------------  Chemistries   Recent Labs Lab 04/17/17 1141  NA 132*  K 3.8  CL 103  CO2 12*  GLUCOSE 485*  BUN 18  CREATININE 1.11  CALCIUM 8.1*   ------------------------------------------------------------------------------------------------------------------  Cardiac Enzymes No results for input(s): TROPONINI in the last 168 hours. ------------------------------------------------------------  RADIOLOGY:  No results found.     Thank  you for the consultation and for allowing [facility] to assist in the care of your patient. Our recommendations are noted above.  Please contact us if we can be of further service.   Wells Guiles, MD.  Board Certified in Internal Medicine, Pulmonary Medicine, Critical Care Medicine, and Sleep Medicine.  [facility] Office Number: [phone]  Santiago Glad, M.D.  Billy Fischer, M.D  04/17/2017

## 2017-04-17 NOTE — H&P (Signed)
[facility] - [place] at [facility]   PATIENT NAME: Trevor Brown    MR#:  [id number]  DATE OF BIRTH:  11-24-1990  DATE OF ADMISSION:  04/17/2017  PRIMARY CARE PHYSICIAN: Patient, No Pcp Per   REQUESTING/REFERRING PHYSICIAN: Scotty Court  CHIEF COMPLAINT:  Medication refill  HISTORY OF PRESENT ILLNESS:  Trevor Brown  is a 26 y.o. male with a known history of Insulin requiring diabetes mellitus, hepatitis C, hypertension is visiting to the ED for medication refill. He was complaining of generalized headache body aches for the past 2-3 days, patient ran out of his insulin 2 weeks ago and has been not taking it. Also reporting frequent urination and being thirsty. [facility] and [facility] his appointments. Blood sugar was 801 with anion gap at 16. Patient is started on DKA protocol and hospitalist team is called to admit the patient. Patient denies any nausea or vomiting but reporting generalized body aches and asking pain medicine  PAST MEDICAL HISTORY:   Past Medical History:  Diagnosis Date  . Diabetes mellitus without complication (HCC)   . Heart attack (HCC)    Pt claims he had heart attack a year ago ( 2015)- and was admitted in [facility] for that, but not given any meds or angiogram, on review of chart- I could not find any details like that.  . Hepatitis C, acute may 2016  . Hypertension     PAST SURGICAL HISTOIRY:  No past surgical history on file.  SOCIAL HISTORY:   Social History  Substance Use Topics  . Smoking status: Current Every Day Smoker    Packs/day: 2.00    Types: Cigarettes  . Smokeless tobacco: Never Used  . Alcohol use No    FAMILY HISTORY:   Family History  Problem Relation Age of Onset  . Cirrhosis Mother   . Diabetes Mellitus II Maternal Grandmother     DRUG ALLERGIES:   Allergies  Allergen Reactions  . Bee Venom Anaphylaxis  . Penicillins Anaphylaxis, Hives and Other (See  Comments)    Has patient had a PCN reaction causing immediate rash, facial/tongue/throat swelling, SOB or lightheadedness with hypotension: Yes Has patient had a PCN reaction causing severe rash involving mucus membranes or skin necrosis: No Has patient had a PCN reaction that required hospitalization No Has patient had a PCN reaction occurring within the last 10 years: Yes If all of the above answers are "NO", then may proceed with Cephalosporin use.  . Ibuprofen Other (See Comments)    Other reaction(s): Other (See Comments) Stomach upset Reaction:  GI upset   . Tramadol Hives and Swelling  . Vancomycin Rash and Other (See Comments)    Reaction:  Red man's syndrome     REVIEW OF SYSTEMS:  CONSTITUTIONAL: No fever, fatigue or weakness. Generalized body aches EYES: No blurred or double vision.  EARS, NOSE, AND THROAT: No tinnitus or ear pain.  RESPIRATORY: No cough, shortness of breath, wheezing or hemoptysis.  CARDIOVASCULAR: No chest pain, orthopnea, edema.  GASTROINTESTINAL: No nausea, vomiting, diarrhea or abdominal pain.  GENITOURINARY: No dysuria, hematuria.  ENDOCRINE: Reporting frequent urinary frequent urination, being thirsty HEMATOLOGY: No anemia, easy bruising or bleeding SKIN: No rash or lesion. MUSCULOSKELETAL: No joint pain or arthritis.   NEUROLOGIC: No tingling, numbness, weakness.  PSYCHIATRY: No anxiety or depression.   MEDICATIONS AT HOME:   Prior to Admission medications   Medication Sig Start Date End Date Taking? Authorizing Provider  insulin aspart (NOVOLOG) 100 UNIT/ML injection Give subcutaneously before meals, 10-20 units each dose 03/14/17  Yes Summers, Rhonda L, PA-C  insulin aspart protamine- aspart (NOVOLOG MIX 70/30) (70-30) 100 UNIT/ML injection Inject 0.3 mLs (30 Units total) into the skin 2 (two) times daily. 03/14/17 04/17/18 Yes Summers, Rhonda L, PA-C  pantoprazole (PROTONIX) 40 MG tablet Take 1 tablet (40 mg total) by mouth daily. Patient not  taking: Reported on 04/17/2017 02/04/17   Auburn Bilberry, MD      VITAL SIGNS:  Blood pressure (!) 107/59, pulse 67, temperature 98 F (36.7 C), temperature source Oral, resp. rate 20, height  (1.626 m), weight 54.1 kg (119 lb 4.3 oz), SpO2 94 %.  PHYSICAL EXAMINATION:  GENERAL:  26 y.o.-year-old patient lying in the bed with no acute distress.  EYES: Pupils equal, round, reactive to light and accommodation. No scleral icterus. Extraocular muscles intact.  HEENT: Head atraumatic, normocephalic. Oropharynx and nasopharynx clear. Poor dentition, dry mucous membranes NECK:  Supple, no jugular venous distention. No thyroid enlargement, no tenderness.  LUNGS: Normal breath sounds bilaterally, no wheezing, rales,rhonchi or crepitation. No use of accessory muscles of respiration.  CARDIOVASCULAR: S1, S2 normal. No murmurs, rubs, or gallops.  ABDOMEN: Soft, nontender, nondistended. Bowel sounds present. No organomegaly or mass.  EXTREMITIES: No pedal edema, cyanosis, or clubbing.  NEUROLOGIC: Cranial nerves II through XII are intact. Muscle strength 5/5 in all extremities. Sensation intact. Gait not checked.  PSYCHIATRIC: The patient is alert and oriented x 3.  SKIN: No obvious rash, lesion, or ulcer.   LABORATORY PANEL:   CBC  Recent Labs Lab 04/17/17 0725  WBC 8.1  HGB 13.6  HCT 41.0  PLT 242   ------------------------------------------------------------------------------------------------------------------  Chemistries   Recent Labs Lab 04/17/17 1141  NA 132*  K 3.8  CL 103  CO2 12*  GLUCOSE 485*  BUN 18  CREATININE 1.11  CALCIUM 8.1*   ------------------------------------------------------------------------------------------------------------------  Cardiac Enzymes No results for input(s): TROPONINI in the last 168 hours. ------------------------------------------------------------------------------------------------------------------  RADIOLOGY:  No results  found.  EKG:   Orders placed or performed during the hospital encounter of 10/13/16  . ED EKG  . ED EKG    IMPRESSION AND PLAN:   Trevor Brown  is a 26 y.o. male with a known history of Insulin requiring diabetes mellitus, hepatitis C, hypertension is visiting to the ED for medication refill. He was complaining of generalized headache body aches for the past 2-3 days, patient ran out of his insulin 2 weeks ago and has been not taking it. Also reporting frequent urination and being thirsty. [facility] and [facility] his appointments. Blood sugar was 801 with anion gap at 16. Patient is started on DKA protocol   # DKA 2/2 non compliance Stepdown unit Insulin drip and DKA protocol Aggressive hydration with IV fluids with normal saline and change IV fluids to D5 half-normal with 20 KCl 1 blood sugar reaches 250 Serial BMPs Consult diabetic coordinator  #Pseudohyponatremia from DKA Hydrated with IV fluids and it should automatically be corrected with IV fluids and insulin drip Monitor serial BMPs  #Noncompliance with the medications Reinforced the importance of being compliant with the medications including insulin  #Tobacco abuse disorder and history of drug abuse in the past urine drug screen-positive cocaine Counselled patient to quit smoking for 4-5 minutes, we'll start him on nicotine patch  Patient will be benefited with outpatient rehabilitation follow-up  #history of hepatitis C  Outpatient  follow-up with gastroenterology as recommended     GI prophylaxis  All the records are reviewed and case discussed with ED provider. Management plans discussed with the patient, family and they are in agreement.  CODE STATUS: FC  TOTAL  CRITICAL CARE TIME TAKING CARE OF THIS PATIENT: 45  minutes.   Note: This dictation was prepared with Dragon dictation along with smaller phrase technology. Any transcriptional errors that result from  this process are unintentional.  Ramonita Lab M.D on 04/17/2017 at 2:59 PM  Between 7am to 6pm - Pager - [phone]  After 6pm go to [email or web address] - password [email or web address]  [facility]  Office  [phone]  CC: Primary care physician; Patient, No Pcp Per

## 2017-04-17 NOTE — ED Notes (Signed)
Dr. Scotty Court notified that pt was given  of toradol. No new orders

## 2017-04-17 NOTE — ED Provider Notes (Signed)
[facility] Emergency Department Provider Note  ____________________________________________  Time seen: Approximately 9:14 AM  I have reviewed the triage vital signs and the nursing notes.   HISTORY  Chief Complaint Medication Refill    HPI Trevor Brown is a 26 y.o. male who complains of generalized headache and upper abdominal pain for the past 2-3 days, gradually worsening. Reports he ran out of his insulin about a week or 2 ago. Has also been having polyuria and polydipsia. He is homeless, [facility] but missed his appointment. Pain is nonradiating, moderate, aching, no aggravating or alleviating factors.     Past Medical History:  Diagnosis Date  . Diabetes mellitus without complication (HCC)   . Heart attack (HCC)    Pt claims he had heart attack a year ago ( 2015)- and was admitted in [facility] for that, but not given any meds or angiogram, on review of chart- I could not find any details like that.  . Hepatitis C, acute may 2016  . Hypertension      Patient Active Problem List   Diagnosis Date Noted  . Dental erosion extending into pulp 12/31/2016  . 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 Elevated transaminase level 03/27/2016  . Diabetes mellitus type 1 (HCC) 03/27/2016  . Tobacco abuse counseling 03/27/2016  . Transaminitis 03/25/2016  . Tobacco abuse 01/10/2016  . Type 1 diabetes mellitus with hyperglycemia (HCC) 12/12/2015  . Cocaine abuse 12/12/2015  . DKA (diabetic ketoacidoses) (HCC) 06/10/2015  . Hidradenitis suppurativa of left axilla   . DKA, type 1 (HCC) 06/06/2015  . Malnutrition of moderate degree (HCC) 04/08/2015  . Hepatitis C 12/13/2014  . Diabetes type 1, uncontrolled (HCC) 12/11/2014  . Major depressive disorder, single episode, mild (HCC)   . Major depression, single episode 12/10/2014  . Diabetes mellitus type 1, uncontrolled (HCC) 12/09/2014  . Homelessness 12/09/2014     No past surgical  history on file.   Prior to Admission medications   Medication Sig Start Date End Date Taking? Authorizing Provider  insulin aspart (NOVOLOG) 100 UNIT/ML injection Give subcutaneously before meals, 10-20 units each dose 03/14/17   Bridget Hartshorn L, PA-C  insulin aspart protamine- aspart (NOVOLOG MIX 70/30) (70-30) 100 UNIT/ML injection Inject 0.3 mLs (30 Units total) into the skin 2 (two) times daily. 03/14/17 04/14/17  Tommi Rumps, PA-C  oxyCODONE (OXY IR/ROXICODONE) 5 MG immediate release tablet Take 5 mg by mouth. 01/02/16   [provider]  pantoprazole (PROTONIX) 40 MG tablet Take 1 tablet (40 mg total) by mouth daily. 02/04/17   Auburn Bilberry, MD     Allergies Bee venom; Penicillins; Ibuprofen; Tramadol; and Vancomycin   Family History  Problem Relation Age of Onset  . Cirrhosis Mother   . Diabetes Mellitus II Maternal Grandmother     Social History Social History  Substance Use Topics  . Smoking status: Current Every Day Smoker    Packs/day: 2.00    Types: Cigarettes  . Smokeless tobacco: Never Used  . Alcohol use No    Review of Systems  Constitutional:   No fever or chills.  ENT:   No sore throat. No rhinorrhea. Cardiovascular:   No chest pain or syncope. Respiratory:   No dyspnea or cough. Gastrointestinal: positive as above for abdominal pain without vomiting or diarrhea  Musculoskeletal:   Negative for focal pain or swelling All other systems reviewed and are negative except as documented above in ROS and HPI.  ____________________________________________   PHYSICAL EXAM:  VITAL SIGNS: ED Triage Vitals  Enc Vitals Group     BP 04/17/17 0706 128/80     Pulse Rate 04/17/17 0706 88     Resp 04/17/17 0706 18     Temp 04/17/17 0706 97.6 F (36.4 C)     Temp Source 04/17/17 0706 Oral     SpO2 04/17/17 0706 99 %     Weight 04/17/17 0707 120 lb (54.4 kg)     Height 04/17/17 0707  (1.626 m)     Head Circumference --      Peak Flow --       Pain Score 04/17/17 0707 9     Pain Loc --      Pain Edu? --      Excl. in GC? --     Vital signs reviewed, nursing assessments reviewed.   Constitutional:   Alert and oriented.ill appearing, not in distress Eyes:   No scleral icterus.  EOMI. No nystagmus. No conjunctival pallor. PERRL. ENT   Head:   Normocephalic and atraumatic.   Nose:   No congestion/rhinnorhea.    Mouth/Throat:   dry mucous membranes, no pharyngeal erythema. No peritonsillar mass. diffuse dental decay   Neck:   No meningismus. Full ROM. No midline tenderness Hematological/Lymphatic/Immunilogical:   No cervical lymphadenopathy. Cardiovascular:   RRR. Symmetric bilateral radial and DP pulses.  No murmurs.  Respiratory:   Normal respiratory effort without tachypnea/retractions. Breath sounds are clear and equal bilaterally. No wheezes/rales/rhonchi. Gastrointestinal:   Soft with mild left upper quadrant tenderness. Non distended. There is no CVA tenderness.  No rebound, rigidity, or guarding. Genitourinary:   deferred Musculoskeletal:   Normal range of motion in all extremities. No joint effusions.  No lower extremity tenderness.  No edema. Neurologic:   Normal speech and language.  Motor grossly intact. No gross focal neurologic deficits are appreciated.  Skin:    Skin is warm, dry and intact. No rash noted.  No petechiae, purpura, or bullae.  ____________________________________________    LABS (pertinent positives/negatives) (all labs ordered are listed, but only abnormal results are displayed) Labs Reviewed  BASIC METABOLIC PANEL - Abnormal; Notable for the following:       Result Value   Sodium 127 (*)    Chloride 91 (*)    CO2 20 (*)    Glucose, Bld 801 (*)    Anion gap 16 (*)    All other components within normal limits  URINALYSIS, COMPLETE (UACMP) WITH MICROSCOPIC - Abnormal; Notable for the following:    Color, Urine COLORLESS (*)    APPearance CLEAR (*)    Glucose, UA >=500  (*)    Ketones, ur 20 (*)    Squamous Epithelial / LPF 0-5 (*)    All other components within normal limits  BLOOD GAS, VENOUS - Abnormal; Notable for the following:    pCO2, Ven 37 (*)    Bicarbonate 19.1 (*)    Acid-base deficit 6.3 (*)    All other components within normal limits  GLUCOSE, CAPILLARY - Abnormal; Notable for the following:    Glucose-Capillary >600 (*)    All other components within normal limits  GLUCOSE, CAPILLARY - Abnormal; Notable for the following:    Glucose-Capillary >600 (*)    All other components within normal limits  GLUCOSE, CAPILLARY - Abnormal; Notable for the following:    Glucose-Capillary >600 (*)    All other components within normal limits  CBC WITH DIFFERENTIAL/PLATELET  BETA-HYDROXYBUTYRIC  ACID   ____________________________________________   EKG    ____________________________________________    RADIOLOGY  No results found.  ____________________________________________   PROCEDURES Procedures CRITICAL CARE Performed by: Scotty Court, Tomaz Janis   Total critical care time: 35 minutes  Critical care time was exclusive of separately billable procedures and treating other patients.  Critical care was necessary to treat or prevent imminent or life-threatening deterioration.  Critical care was time spent personally by me on the following activities: development of treatment plan with patient and/or surrogate as well as nursing, discussions with consultants, evaluation of patient's response to treatment, examination of patient, obtaining history from patient or surrogate, ordering and performing treatments and interventions, ordering and review of laboratory studies, ordering and review of radiographic studies, pulse oximetry and re-evaluation of patient's condition.  ____________________________________________   INITIAL IMPRESSION / ASSESSMENT AND PLAN / ED COURSE  Pertinent labs & imaging results that were available during my care  of the patient were reviewed by me and considered in my medical decision making (see chart for details).  Patient presents with headache and abdominal pain, triage fingerstick is greater than 600. Patient has history of frequent DKA episodes due to medication noncompliance. He's been off his insulin, labs were checked which showed a blood sugar of 800, urine ketosis, anion gap elevation. VBG was unremarkable, but with patient's dehydration, severe hyperglycemia and developing DKA, patient is continued on fluid boluses, insulin drip, case discussed with hospitalist for admission.    Patient given Toradol for pain and Protonix for gastric protection.   ----------------------------------------- 9:20 AM on 04/17/2017 -----------------------------------------  Vitals remain stable. Pain controlled.     ____________________________________________   FINAL CLINICAL IMPRESSION(S) / ED DIAGNOSES  Final diagnoses:  Hyperglycemia  Diabetic ketoacidosis without coma associated with type 1 diabetes mellitus (HCC)      New Prescriptions   No medications on file     Portions of this note were generated with dragon dictation software. Dictation errors may occur despite best attempts at proofreading.    Sharman Cheek, MD 04/17/17 [phone]

## 2017-04-17 NOTE — ED Triage Notes (Addendum)
Pt ambulatory to triage, no apparent distress noted. Pt reports has been out of insulin for one week. Pt reports headache. Respirations even and nonlabored. CBG in triage too high to read.

## 2017-04-17 NOTE — Clinical Social Work Note (Signed)
Clinical Social Work Assessment  Patient Details  Name: Trevor Brown MRN: [id number] Date of Birth: [birth year]  Date of referral:  04/17/17               Reason for consult:  Housing Concerns/Homelessness                Permission sought to share information with:    Permission granted to share information::     Name::        Agency::     Relationship::     Contact Information:     Housing/Transportation Living arrangements for the past 2 months:   (Friend's house) Source of Information:  Patient Patient Interpreter Needed:  None Criminal Activity/Legal Involvement Pertinent to Current Situation/Hospitalization:  No - Comment as needed Significant Relationships:  Friend Lives with:  Friends Do you feel safe going back to the place where you live?  Yes Need for family participation in patient care:  No (Coment)  Care giving concerns:  Patient is independent in ADL's.   Social Worker assessment / plan:  CSW consulted on patient due to patient allegedly being homeless. Patient is known to CSW from previous admissions. CSW met with patient this afternoon and explained the reason for my visit. Patient states that he is not homeless and that he still lives with a "friend." Patient states that his friend is probably getting tired of him staying there but states he can return there at discharge. Nothing further for CSW to offer at this time.  Employment status:  Unemployed Forensic scientist:  Self Pay (Medicaid Pending) PT Recommendations:    Information / Referral to community resources:     Patient/Family's Response to care:  Patient expressed appreciation for CSW visit.  Patient/Family's Understanding of and Emotional Response to Diagnosis, Current Treatment, and Prognosis:  Patient is consistently non-compliant with obtaining and taking his medications even with extra assistance by various professionals.   Emotional Assessment Appearance:  Appears older than stated  age Attitude/Demeanor/Rapport:   (pleasant) Affect (typically observed):  Calm Orientation:  Oriented to Self, Oriented to Place, Oriented to  Time, Oriented to Situation Alcohol / Substance use:  Illicit Drugs Psych involvement (Current and /or in the community):  No (Comment)  Discharge Needs  Concerns to be addressed:  Homelessness Readmission within the last 30 days:  No Current discharge risk:  None Barriers to Discharge:  Active Substance Use   Shela Leff, LCSW 04/17/2017, 3:31 PM

## 2017-04-18 ENCOUNTER — Encounter: Payer: Self-pay | Admitting: *Deleted

## 2017-04-18 LAB — COMPREHENSIVE METABOLIC PANEL
ALT: 42 U/L (ref 17–63)
AST: 48 U/L — ABNORMAL HIGH (ref 15–41)
Albumin: 2.7 g/dL — ABNORMAL LOW (ref 3.5–5.0)
Alkaline Phosphatase: 142 U/L — ABNORMAL HIGH (ref 38–126)
Anion gap: 5 (ref 5–15)
BUN: 9 mg/dL (ref 6–20)
CO2: 24 mmol/L (ref 22–32)
Calcium: 8.1 mg/dL — ABNORMAL LOW (ref 8.9–10.3)
Chloride: 111 mmol/L (ref 101–111)
Creatinine, Ser: 0.44 mg/dL — ABNORMAL LOW (ref 0.61–1.24)
GFR calc Af Amer: >60 mL/min (ref >60)
GFR calc non Af Amer: >60 mL/min (ref >60)
Glucose, Bld: 103 mg/dL — ABNORMAL HIGH (ref 65–99)
Potassium: 3.3 mmol/L — ABNORMAL LOW (ref 3.5–5.1)
Sodium: 140 mmol/L (ref 135–145)
Total Bilirubin: 0.4 mg/dL (ref 0.3–1.2)
Total Protein: 5.5 g/dL — ABNORMAL LOW (ref 6.5–8.1)

## 2017-04-18 LAB — GLUCOSE, CAPILLARY
Glucose-Capillary: 165 mg/dL — ABNORMAL HIGH (ref 65–99)
Glucose-Capillary: 199 mg/dL — ABNORMAL HIGH (ref 65–99)
Glucose-Capillary: 134 mg/dL — ABNORMAL HIGH (ref 65–99)
Glucose-Capillary: 99 mg/dL (ref 65–99)
Glucose-Capillary: 149 mg/dL — ABNORMAL HIGH (ref 65–99)
Glucose-Capillary: 154 mg/dL — ABNORMAL HIGH (ref 65–99)
Glucose-Capillary: 104 mg/dL — ABNORMAL HIGH (ref 65–99)
Glucose-Capillary: 443 mg/dL — ABNORMAL HIGH (ref 65–99)
Glucose-Capillary: 489 mg/dL — ABNORMAL HIGH (ref 65–99)
Glucose-Capillary: 378 mg/dL — ABNORMAL HIGH (ref 65–99)
Glucose-Capillary: 363 mg/dL — ABNORMAL HIGH (ref 65–99)
Glucose-Capillary: 283 mg/dL — ABNORMAL HIGH (ref 65–99)

## 2017-04-18 LAB — TSH: TSH: 0.24 u[IU]/mL — ABNORMAL LOW (ref 0.350–4.500)

## 2017-04-18 LAB — CBC
HCT: 36 % — ABNORMAL LOW (ref 40.0–52.0)
Hemoglobin: 12.4 g/dL — ABNORMAL LOW (ref 13.0–18.0)
MCH: 30.6 pg (ref 26.0–34.0)
MCHC: 34.4 g/dL (ref 32.0–36.0)
MCV: 88.8 fL (ref 80.0–100.0)
Platelets: 211 10*3/uL (ref 150–440)
RBC: 4.06 MIL/uL — ABNORMAL LOW (ref 4.40–5.90)
RDW: 14 % (ref 11.5–14.5)
WBC: 7 10*3/uL (ref 3.8–10.6)

## 2017-04-18 LAB — HEMOGLOBIN A1C
Hgb A1c MFr Bld: 10.6 % — ABNORMAL HIGH (ref 4.8–5.6)
Mean Plasma Glucose: 257.52 mg/dL

## 2017-04-18 MED ORDER — INSULIN ASPART 100 UNIT/ML ~~LOC~~ SOLN
0.0000 [IU] | Freq: Three times a day (TID) | SUBCUTANEOUS | Status: DC
  Administered 2017-04-18 (×2): 9 [IU] via SUBCUTANEOUS
  Administered 2017-04-19: 3 [IU] via SUBCUTANEOUS
  Administered 2017-04-19: 2 [IU] via SUBCUTANEOUS
  Administered 2017-04-19: 9 [IU] via SUBCUTANEOUS
  Administered 2017-04-20: 7 [IU] via SUBCUTANEOUS
  Administered 2017-04-20: 3 [IU] via SUBCUTANEOUS
  Administered 2017-04-21: 7 [IU] via SUBCUTANEOUS
  Administered 2017-04-21: 3 [IU] via SUBCUTANEOUS
  Filled 2017-04-18 (×9): qty 1

## 2017-04-18 MED ORDER — INSULIN ASPART 100 UNIT/ML ~~LOC~~ SOLN
0.0000 [IU] | Freq: Every day | SUBCUTANEOUS | Status: DC
  Administered 2017-04-18: 3 [IU] via SUBCUTANEOUS
  Filled 2017-04-18: qty 1

## 2017-04-18 MED ORDER — INSULIN ASPART PROT & ASPART (70-30 MIX) 100 UNIT/ML ~~LOC~~ SUSP
30.0000 [IU] | Freq: Two times a day (BID) | SUBCUTANEOUS | Status: DC
  Administered 2017-04-18 (×2): 30 [IU] via SUBCUTANEOUS
  Filled 2017-04-18 (×2): qty 10

## 2017-04-18 NOTE — Progress Notes (Signed)
Patient is now off of insulin infusion. Still with high CBGs. Diabetes coordinator has made recommendations. I have placed order for transfer to MedSurg floor  After transfer, PCCM will sign off. Please call if we can be of further assistance    Billy Fischer, MD PCCM service Mobile [phone] Pager [phone] 04/18/2017 1:11 PM

## 2017-04-18 NOTE — Progress Notes (Signed)
[facility] - [place] at [facility]   PATIENT NAME: Trevor Brown    MR#:  [id number]  DATE OF BIRTH:  1991-07-21  SUBJECTIVE:  CHIEF COMPLAINT:  Patient is feeling better and he reports that he [facility]; he is also aware that " his body will shut  down if he doesn't take insulin as recommended'  REVIEW OF SYSTEMS:  CONSTITUTIONAL: No fever, fatigue or weakness.  EYES: No blurred or double vision.  EARS, NOSE, AND THROAT: No tinnitus or ear pain.  RESPIRATORY: No cough, shortness of breath, wheezing or hemoptysis.  CARDIOVASCULAR: No chest pain, orthopnea, edema.  GASTROINTESTINAL: No nausea, vomiting, diarrhea or abdominal pain.  GENITOURINARY: No dysuria, hematuria.  ENDOCRINE: No polyuria, nocturia,  HEMATOLOGY: No anemia, easy bruising or bleeding SKIN: No rash or lesion. MUSCULOSKELETAL: No joint pain or arthritis.   NEUROLOGIC: No tingling, numbness, weakness.  PSYCHIATRY: No anxiety or depression.   DRUG ALLERGIES:   Allergies  Allergen Reactions  . Bee Venom Anaphylaxis  . Penicillins Anaphylaxis, Hives and Other (See Comments)    Has patient had a PCN reaction causing immediate rash, facial/tongue/throat swelling, SOB or lightheadedness with hypotension: Yes Has patient had a PCN reaction causing severe rash involving mucus membranes or skin necrosis: No Has patient had a PCN reaction that required hospitalization No Has patient had a PCN reaction occurring within the last 10 years: Yes If all of the above answers are "NO", then may proceed with Cephalosporin use.  . Ibuprofen Other (See Comments)    Other reaction(s): Other (See Comments) Stomach upset Reaction:  GI upset   . Tramadol Hives and Swelling  . Vancomycin Rash and Other (See Comments)    Reaction:  Red man's syndrome     VITALS:  Blood pressure 132/79, pulse 77, temperature 98.6 F (37 C), temperature source Oral,  resp. rate (!) 27, height  (1.626 m), weight 54.1 kg (119 lb 4.3 oz), SpO2 98 %.  PHYSICAL EXAMINATION:  GENERAL:  26 y.o.-year-old patient lying in the bed with no acute distress.  EYES: Pupils equal, round, reactive to light and accommodation. No scleral icterus. Extraocular muscles intact.  HEENT: Head atraumatic, normocephalic. Oropharynx and nasopharynx clear.  NECK:  Supple, no jugular venous distention. No thyroid enlargement, no tenderness.  LUNGS: Normal breath sounds bilaterally, no wheezing, rales,rhonchi or crepitation. No use of accessory muscles of respiration.  CARDIOVASCULAR: S1, S2 normal. No murmurs, rubs, or gallops.  ABDOMEN: Soft, nontender, nondistended. Bowel sounds present. No organomegaly or mass.  EXTREMITIES: No pedal edema, cyanosis, or clubbing.  NEUROLOGIC: Cranial nerves II through XII are intact. Muscle strength 5/5 in all extremities. Sensation intact. Gait not checked.  PSYCHIATRIC: The patient is alert and oriented x 3.  SKIN: No obvious rash, lesion, or ulcer.    LABORATORY PANEL:   CBC  Recent Labs Lab 04/18/17 0402  WBC 7.0  HGB 12.4*  HCT 36.0*  PLT 211   ------------------------------------------------------------------------------------------------------------------  Chemistries   Recent Labs Lab 04/18/17 0402  NA 140  K 3.3*  CL 111  CO2 24  GLUCOSE 103*  BUN 9  CREATININE 0.44*  CALCIUM 8.1*  AST 48*  ALT 42  ALKPHOS 142*  BILITOT 0.4   ------------------------------------------------------------------------------------------------------------------  Cardiac Enzymes No results for input(s): TROPONINI in the last 168 hours. ------------------------------------------------------------------------------------------------------------------  RADIOLOGY:  No results found.  EKG:   Orders placed or performed during the hospital encounter of 10/13/16  .  ED EKG  . ED EKG    ASSESSMENT AND PLAN:   Kolston Lacount  is a  26 y.o. male with a known history of Insulin requiring diabetes mellitus, hepatitis C, hypertension is visiting to the ED for medication refill. He was complaining of generalized headache body aches for the past 2-3 days, patient ran out of his insulin 2 weeks ago and has been not taking it. Also reporting frequent urination and being thirsty. [facility] and [facility]. Blood sugar was 801 with anion gap at 16. Patient is started on DKA protocol   # DKA 2/2 non compliance Insulin drip d/ced , resume his home dose insulin 70/30  changed  IV fluids to D5 half-normal with 20 KCl   diabetic coordinator following  Transferring to floor  #Pseudohyponatremia from DKA Hydrated with IV fluids sodium is in the normal range now   #Noncompliance with the medications Reinforced the importance of being compliant with the medications including insulin  #Tobacco abuse disorder and history of drug abuse in the past urine drug screen-positive cocaine Counselled patient to quit smoking for 4-5 minutes, we'll start him on nicotine patch  Patient will be benefited with outpatient rehabilitation follow-up  #history of hepatitis C  Outpatient follow-up with gastroenterology as recommended     GI prophylaxis     All the records are reviewed and case discussed with Care Management/Social Workerr. Management plans discussed with the patient, family and they are in agreement.  CODE STATUS: fc   TOTAL TIME TAKING CARE OF THIS PATIENT: 36  minutes.   POSSIBLE D/C IN 1-2  DAYS, DEPENDING ON CLINICAL CONDITION.  Note: This dictation was prepared with Dragon dictation along with smaller phrase technology. Any transcriptional errors that result from this process are unintentional.   Ramonita Lab M.D on 04/18/2017 at 3:40 PM  Between 7am to 6pm - Pager - [phone] After 6pm go to [email or web address] - password [email or web address]  [facility]  Office  [phone]  CC: Primary care physician; Patient, No Pcp Per

## 2017-04-18 NOTE — Progress Notes (Signed)
Spoke to diabetes coordinator, [place], regarding recommendations about patients blood sugar now 489. She stated that giving patient meal coverage is contraindicated since he received 70/30 insulin.  Plan to give max dose of 9units SSI before lunch and recheck CBG at 2pm to ensure that patient's blood sugar is trending downward.  Will continue to monitor closely.

## 2017-04-18 NOTE — Progress Notes (Signed)
Inpatient Diabetes Program Recommendations  AACE/ADA: New Consensus Statement on Inpatient Glycemic Control (2015)  Target Ranges:  Prepandial:   less than 140 mg/dL      Peak postprandial:   less than 180 mg/dL (1-2 hours)      Critically ill patients:  140 - 180 mg/dL   Results for SIM, CHOQUETTE (MRN [id number]) as of 04/18/2017 08:38  Ref. Range 04/17/2017 07:25  Sodium Latest Ref Range: 135 - 145 mmol/L 127 (L)  Potassium Latest Ref Range: 3.5 - 5.1 mmol/L 4.5  Chloride Latest Ref Range: 101 - 111 mmol/L 91 (L)  CO2 Latest Ref Range: 22 - 32 mmol/L 20 (L)  Glucose Latest Ref Range: 65 - 99 mg/dL 409 (HH)  BUN Latest Ref Range: 6 - 20 mg/dL 15  Creatinine Latest Ref Range: 0.61 - 1.24 mg/dL 8.11  Calcium Latest Ref Range: 8.9 - 10.3 mg/dL 8.9  Anion gap Latest Ref Range: 5 - 15  16 (H)    Admit with: DKA (Ran out of Insulin 2 weeks prior to admission)  History: Type 1 DM (frequent admissions for DKA)  Home DM Meds: 70/30 Insulin 30 units BID  Current Insulin Orders: 70/30 Insulin 30 units BID        Novolog Sensitive Correction Scale/ SSI (0-9 units) TID AC + HS       -This is patient's 5th admission so far this year since January for DKA.  Well known to the Inpatient Diabetes Program.  Has been counseled numerous times about the importance of taking insulin on a regular basis and on the importance of good glucose control to lessen both acute and chronic complications.  -Note patient transitioning off the IV insulin drip this AM.  70/30 Insulin 30 units given at 8am today.     --Will follow patient during hospitalization--  Ambrose Finland RN, MSN, CDE Diabetes Coordinator Inpatient Glycemic Control Team Team Pager: [phone] (8a-5p)

## 2017-04-18 NOTE — Progress Notes (Addendum)
Called by RN caring for Trevor Brown.  CBG at 12pm= 489 mg/dl.  Ate a large breakfast including a whole large bagel, cereal, and milk.    Per RN, patient did receive 30 units 70/30 Insulin this AM before transitioning off IV Insulin drip.  Recommend patient get full dose Novolog SSI at 12pm (9 units) and check another CBG around 2pm to make sure CBGs are trending downward.  Also recommend RN call Dr. Amado Coe to discuss and see if Dr. Amado Coe wants pt to get any additional insulin.     --Will follow patient during hospitalization--  Ambrose Finland RN, MSN, CDE Diabetes Coordinator Inpatient Glycemic Control Team Team Pager: [phone] (8a-5p)

## 2017-04-19 LAB — GLUCOSE, CAPILLARY
Glucose-Capillary: 543 mg/dL (ref 65–99)
Glucose-Capillary: 360 mg/dL — ABNORMAL HIGH (ref 65–99)
Glucose-Capillary: 169 mg/dL — ABNORMAL HIGH (ref 65–99)
Glucose-Capillary: 230 mg/dL — ABNORMAL HIGH (ref 65–99)
Glucose-Capillary: 438 mg/dL — ABNORMAL HIGH (ref 65–99)
Glucose-Capillary: 485 mg/dL — ABNORMAL HIGH (ref 65–99)

## 2017-04-19 LAB — T4, FREE: Free T4: 0.75 ng/dL (ref 0.61–1.12)

## 2017-04-19 LAB — BASIC METABOLIC PANEL
Anion gap: 11 (ref 5–15)
BUN: 15 mg/dL (ref 6–20)
CO2: 27 mmol/L (ref 22–32)
Calcium: 8.7 mg/dL — ABNORMAL LOW (ref 8.9–10.3)
Chloride: 97 mmol/L — ABNORMAL LOW (ref 101–111)
Creatinine, Ser: 0.71 mg/dL (ref 0.61–1.24)
GFR calc Af Amer: >60 mL/min (ref >60)
GFR calc non Af Amer: >60 mL/min (ref >60)
Glucose, Bld: 498 mg/dL — ABNORMAL HIGH (ref 65–99)
Potassium: 4.2 mmol/L (ref 3.5–5.1)
Sodium: 135 mmol/L (ref 135–145)

## 2017-04-19 MED ORDER — INSULIN ASPART 100 UNIT/ML ~~LOC~~ SOLN
14.0000 [IU] | Freq: Once | SUBCUTANEOUS | Status: AC
  Administered 2017-04-19: 14 [IU] via SUBCUTANEOUS
  Filled 2017-04-19: qty 1

## 2017-04-19 MED ORDER — PANTOPRAZOLE SODIUM 40 MG PO TBEC
40.0000 mg | DELAYED_RELEASE_TABLET | Freq: Every day | ORAL | Status: DC
  Administered 2017-04-20 – 2017-04-21 (×2): 40 mg via ORAL
  Filled 2017-04-19 (×2): qty 1

## 2017-04-19 MED ORDER — OXYCODONE HCL 5 MG PO TABS
5.0000 mg | ORAL_TABLET | ORAL | Status: DC | PRN
  Administered 2017-04-19 – 2017-04-21 (×13): 5 mg via ORAL
  Filled 2017-04-19 (×13): qty 1

## 2017-04-19 MED ORDER — INSULIN ASPART PROT & ASPART (70-30 MIX) 100 UNIT/ML ~~LOC~~ SUSP: 35.0000 [IU] | Freq: Two times a day (BID) | SUBCUTANEOUS | Status: DC

## 2017-04-19 MED ORDER — CLINDAMYCIN HCL 150 MG PO CAPS
300.0000 mg | ORAL_CAPSULE | Freq: Three times a day (TID) | ORAL | Status: DC
  Administered 2017-04-19 – 2017-04-21 (×7): 300 mg via ORAL
  Filled 2017-04-19 (×4): qty 2
  Filled 2017-04-19: qty 1
  Filled 2017-04-19 (×2): qty 2

## 2017-04-19 MED ORDER — INSULIN ASPART PROT & ASPART (70-30 MIX) 100 UNIT/ML ~~LOC~~ SUSP
38.0000 [IU] | Freq: Two times a day (BID) | SUBCUTANEOUS | Status: DC
  Administered 2017-04-19: 38 [IU] via SUBCUTANEOUS
  Filled 2017-04-19: qty 10

## 2017-04-19 MED ORDER — INSULIN ASPART 100 UNIT/ML ~~LOC~~ SOLN
20.0000 [IU] | Freq: Once | SUBCUTANEOUS | Status: AC
  Administered 2017-04-19: 20 [IU] via SUBCUTANEOUS
  Filled 2017-04-19: qty 1

## 2017-04-19 MED ORDER — INSULIN ASPART PROT & ASPART (70-30 MIX) 100 UNIT/ML ~~LOC~~ SUSP: 40.0000 [IU] | Freq: Two times a day (BID) | SUBCUTANEOUS | Status: DC

## 2017-04-19 MED ORDER — INSULIN ASPART PROT & ASPART (70-30 MIX) 100 UNIT/ML ~~LOC~~ SUSP
35.0000 [IU] | Freq: Once | SUBCUTANEOUS | Status: AC
  Administered 2017-04-19: 35 [IU] via SUBCUTANEOUS
  Filled 2017-04-19: qty 10

## 2017-04-19 NOTE — Progress Notes (Signed)
Paged MD concerning blood sugar of 485

## 2017-04-19 NOTE — Progress Notes (Signed)
Patient ID: Trevor Brown, male   DOB: [birth year], 26 y.o.   MRN: [id number]  [facility] PROGRESS NOTE  ROBER Brown WUJ:[id number] DOB: February 17, 1991 DOA: 04/17/2017 PCP: Patient, No Pcp Per  HPI/Subjective: Patient states his teeth are hurting him.  Having some left lower quadrant abdominal pain. Patient had a sugar of over 500 this morning.  Objective: Vitals:   04/19/17 0559 04/19/17 0859  BP: 135/79 (!) 132/92  Pulse: (!) 56 (!) 115  Resp:  18  Temp: 98.2 F (36.8 C)   SpO2: 99% 100%    Filed Weights   04/17/17 0707 04/17/17 1056  Weight: 54.4 kg (120 lb) 54.1 kg (119 lb 4.3 oz)    ROS: Review of Systems  Constitutional: Negative for chills and fever.  Eyes: Negative for blurred vision.  Respiratory: Negative for cough and shortness of breath.   Cardiovascular: Negative for chest pain.  Gastrointestinal: Positive for abdominal pain. Negative for constipation, diarrhea, nausea and vomiting.  Genitourinary: Negative for dysuria.  Musculoskeletal: Negative for joint pain.  Neurological: Negative for dizziness and headaches.   Exam: Physical Exam  Constitutional: He is oriented to person, place, and time.  HENT:  Nose: No mucosal edema.  Mouth/Throat: No oropharyngeal exudate or posterior oropharyngeal edema.  Poor dentition throughout the entire mouth.  Eyes: Pupils are equal, round, and reactive to light. Conjunctivae, EOM and lids are normal.  Neck: No JVD present. Carotid bruit is not present. No edema present. No thyroid mass and no thyromegaly present.  Cardiovascular: S1 normal and S2 normal.  Exam reveals no gallop.   No murmur heard. Pulses:      Dorsalis pedis pulses are 2+ on the right side, and 2+ on the left side.  Respiratory: No respiratory distress. He has no wheezes. He has no rhonchi. He has no rales.  GI: Soft. Bowel sounds are normal. There is no tenderness.  Musculoskeletal:       Right ankle: He exhibits no swelling.       Left ankle: He  exhibits no swelling.  Lymphadenopathy:    He has no cervical adenopathy.  Neurological: He is alert and oriented to person, place, and time. No cranial nerve deficit.  Skin: Skin is warm. No rash noted. Nails show no clubbing.  Psychiatric: He has a normal mood and affect.      Data Reviewed: Basic Metabolic Panel:  Recent Labs Lab 04/17/17 1441 04/17/17 1900 04/17/17 2248 04/18/17 0402 04/19/17 0410  NA 136 136 135 140 135  K 4.1 4.3 3.6 3.3* 4.2  CL 106 108 108 111 97*  CO2 23 18* 19* 24 27  GLUCOSE 140* 176* 277* 103* 498*  BUN CREATININE 0.71 0.55* 0.74 0.44* 0.71  CALCIUM 8.0* 8.1* 7.8* 8.1* 8.7*   Liver Function Tests:  Recent Labs Lab 04/18/17 0402  AST 48*  ALT 42  ALKPHOS 142*  BILITOT 0.4  PROT 5.5*  ALBUMIN 2.7*   CBC:  Recent Labs Lab 04/17/17 0725 04/18/17 0402  WBC 8.1 7.0  NEUTROABS 5.4  --   HGB 13.6 12.4*  HCT 41.0 36.0*  MCV 91.8 88.8  PLT 242 211    CBG:  Recent Labs Lab 04/18/17 2103 04/19/17 0601 04/19/17 0734 04/19/17 0857 04/19/17 1159  GLUCAP 283* 543* 360* 169* 230*    Recent Results (from the past 240 hour(s))  MRSA PCR Screening     Status: Abnormal   Collection Time: 04/17/17 12:29 PM  Result  Value Ref Range Status   MRSA by PCR POSITIVE (A) NEGATIVE Final    Comment:        The GeneXpert MRSA Assay (FDA approved for NASAL specimens only), is one component of a comprehensive MRSA colonization surveillance program. It is not intended to diagnose MRSA infection nor to guide or monitor treatment for MRSA infections. RESULT CALLED TO, READ BACK BY AND VERIFIED WITH: AMELIA BERRY AT 1558 04/17/2017 BY TFK.       Scheduled Meds: . clindamycin  300 mg Oral Q8H  . enoxaparin (LOVENOX) injection  40 mg Subcutaneous Q24H  . insulin aspart  0-5 Units Subcutaneous QHS  . insulin aspart  0-9 Units Subcutaneous TID WC  . insulin aspart protamine- aspart  35 Units Subcutaneous BID WC  . nicotine   21 mg Transdermal Daily  . pantoprazole (PROTONIX) IV  40 mg Intravenous Q24H    Assessment/Plan:  1. Diabetic ketoacidosis. This has resolved but the patient still has hyperglycemia. Increase 70/30 insulin to 35 units twice a day. Sliding scale insulin on top of that.  Hemoglobin A1c 10.6. Noncompliance likely part of the cause. 2. Abscessed teeth. Clindamycin started. Patient will need to get [facility] dental clinic as outpatient to handle his teeth. 3. Teeth pain and abdominal pain. Discontinue morphine.  As needed oxycodone. 4. History of hepatitis C 5. Tobacco abuse on nicotine patch  Code Status:     Code Status Orders        Start     Ordered   04/17/17 1056  Full code  Continuous     04/17/17 1055    Code Status History    Date Active Date Inactive Code Status Order ID Comments User Context   02/03/2017  1:36 PM 02/04/2017  9:28 PM Full Code [id number]  Gracelyn Nurse, MD ED   10/13/2016  5:26 AM 10/15/2016  6:12 PM Full Code [id number]  Arnaldo Natal, MD Inpatient   09/08/2016  3:52 AM 09/08/2016  6:37 PM Full Code [id number]  Hugelmeyer, Alexis, DO Inpatient   08/12/2016 10:20 PM 08/13/2016  5:10 PM Full Code [id number]  Katharina Caper, MD Inpatient   07/04/2016 12:24 PM 07/04/2016 12:24 PM Full Code [id number]  Altamese Dilling, MD Inpatient   07/04/2016 12:24 PM 07/05/2016  7:36 PM Full Code [id number]  Altamese Dilling, MD Inpatient   04/09/2016  4:54 PM 04/11/2016  2:09 PM Full Code [id number]  Katharina Caper, MD Inpatient   03/26/2016  1:18 AM 03/27/2016  7:11 PM Full Code [id number]  Hugelmeyer, Jon Gills, DO Inpatient   03/18/2016  8:15 AM 03/21/2016  8:35 PM Full Code [id number]  Wyatt Haste, MD ED   03/12/2016 12:20 PM 03/16/2016  7:27 PM Full Code [id number]  Enedina Finner, MD Inpatient   02/18/2016  4:59 PM 02/19/2016  5:18 PM Full Code [id number]  Ramonita Lab, MD ED   01/12/2016  8:58 AM 01/15/2016  7:33 PM Full Code [id number]  Milagros Loll, MD ED   01/11/2016 12:35 AM  01/11/2016 11:20 PM Full Code [id number]  Gery Pray, MD Inpatient   12/12/2015 10:04 PM 12/14/2015  6:38 PM Full Code [id number]  Katharina Caper, MD Inpatient   10/23/2015  5:42 PM 10/25/2015  7:04 PM Full Code [id number]  Katha Hamming, MD ED   06/10/2015  8:20 AM 06/12/2015  2:41 PM Full Code [id number]  Arnaldo Natal, MD Inpatient   06/06/2015  8:43 PM 06/07/2015  4:20 PM Full Code [id number]  Enedina Finner, MD Inpatient  04/07/2015 12:45 PM 04/08/2015  8:37 PM Full Code [id number]  Altamese Dilling, MD Inpatient   12/09/2014  2:44 PM 12/13/2014  4:16 PM Full Code [id number]  Gale Journey, MD Inpatient      Disposition Plan: potentially home tomorrow  Antibiotics:  clindamycin  Time spent: 26 minutes  Alford Highland  [employer]

## 2017-04-19 NOTE — Progress Notes (Signed)
Pt requesting that his blood sugar be checked. Blood sugar was 543. Notified MD. Orders placed. Will continue to monitor and assess.

## 2017-04-19 NOTE — Progress Notes (Signed)
CONCERNING: IV to Oral Route Change Policy  RECOMMENDATION: This patient is receiving pantoprazole by the intravenous route.  Based on criteria approved by the Pharmacy and Therapeutics Committee, the intravenous medication(s) is/are being converted to the equivalent oral dose form(s).   DESCRIPTION: These criteria include:  The patient is eating (either orally or via tube) and/or has been taking other orally administered medications for a least 24 hours  The patient has no evidence of active gastrointestinal bleeding or impaired GI absorption (gastrectomy, short bowel, patient on TNA or NPO).  If you have questions about this conversion, please contact the Pharmacy Department    [phone] )  [facility]   [phone] )  [facility]   [phone] )  [facility]   [phone] )  [facility]   [phone] )  [facility]   Simpson,Michael L, [facility] 04/19/2017 3:20 PM

## 2017-04-19 NOTE — Progress Notes (Signed)
Patients Blood sugar 438. MD Wieting notified. Per MD order give 9 units of insulin and increase Novalog 70/30 to 38 units BID.

## 2017-04-19 NOTE — Progress Notes (Addendum)
Received orders for 14 units of novolog one time dose

## 2017-04-20 LAB — GLUCOSE, CAPILLARY
Glucose-Capillary: 443 mg/dL — ABNORMAL HIGH (ref 65–99)
Glucose-Capillary: 305 mg/dL — ABNORMAL HIGH (ref 65–99)
Glucose-Capillary: 231 mg/dL — ABNORMAL HIGH (ref 65–99)
Glucose-Capillary: 198 mg/dL — ABNORMAL HIGH (ref 65–99)

## 2017-04-20 MED ORDER — INSULIN ASPART PROT & ASPART (70-30 MIX) 100 UNIT/ML ~~LOC~~ SUSP
42.0000 [IU] | Freq: Two times a day (BID) | SUBCUTANEOUS | Status: DC
  Administered 2017-04-20 (×2): 42 [IU] via SUBCUTANEOUS
  Filled 2017-04-20 (×2): qty 10

## 2017-04-20 MED ORDER — CLINDAMYCIN HCL 300 MG PO CAPS: 300.0000 mg | ORAL_CAPSULE | Freq: Three times a day (TID) | ORAL | 0 refills | Status: DC

## 2017-04-20 MED ORDER — INSULIN ASPART 100 UNIT/ML ~~LOC~~ SOLN
14.0000 [IU] | Freq: Once | SUBCUTANEOUS | Status: AC
  Administered 2017-04-20: 14 [IU] via SUBCUTANEOUS
  Filled 2017-04-20: qty 1

## 2017-04-20 MED ORDER — INSULIN ASPART PROT & ASPART (70-30 MIX) 100 UNIT/ML ~~LOC~~ SUSP: 42.0000 [IU] | Freq: Two times a day (BID) | SUBCUTANEOUS | 0 refills | Status: DC

## 2017-04-20 MED ORDER — INSULIN ASPART 100 UNIT/ML ~~LOC~~ SOLN: SUBCUTANEOUS | 1 refills | Status: DC

## 2017-04-20 MED ORDER — NICOTINE 21 MG/24HR TD PT24: 21.0000 mg | MEDICATED_PATCH | Freq: Every day | TRANSDERMAL | 0 refills | Status: DC

## 2017-04-20 NOTE — Progress Notes (Signed)
Patients blood sugar 443. MD Wieting paged. Verbal order given to give 42 units of 70/30 insulin and one time dose of 14 units short acting insulin.

## 2017-04-20 NOTE — Care Management Note (Signed)
Case Management Note  Patient Details  Name: ISHAAQ PENNA MRN: [id number] Date of Birth: 01-23-1991  Subjective/Objective:   Uninsured Mr Nepomuceno is a brittle insulin dependent diabetic who has used the [facility] program numerous times in the past 12 months to obtain insulin rather than keeping regular appointments at the [facility] or [facility] in [place]. [facility] program is declining a request for assistance with medications today. Discussed this via telephone with Nicolasa Ducking, Assistant Director of Case Management today, and Kennon Rounds agreed that Mr Retter must start using the community resources available to him for healthcare and medication needs. Discussed Mr Delduca lack of follow up with discharge planning in the past with Dr Renae Gloss. Current discharge plan is for discharge tomorrow on Monday and Mr Brouwer can obtain all his medications from [facility] or [facility] across the street from the hospital.                  Action/Plan:   Expected Discharge Date:  04/20/17               Expected Discharge Plan:     In-House Referral:     Discharge planning Services     Post Acute Care Choice:    Choice offered to:     DME Arranged:    DME Agency:     HH Arranged:    HH Agency:     Status of Service:     If discussed at [employer], dates discussed:    Additional Comments:  Aime Meloche A, RN 04/20/2017, 1:58 PM

## 2017-04-20 NOTE — Progress Notes (Signed)
Patient ID: Trevor Brown, male   DOB: 01-15-1991, 26 y.o.   MRN: [id number]   [facility] PROGRESS NOTE  Trevor Brown:[id number] DOB: April 25, 1991 DOA: 04/17/2017 PCP: Patient, No Pcp Per  HPI/Subjective: Patient feeling better today. He states that his tooth pain is a little bit better today. He is able to eat a little bit better. When I saw him this morning he was interested in going home.  Objective: Vitals:   04/19/17 2200 04/20/17 0737  BP: (!) 155/93 116/65  Pulse: 66 73  Resp:  18  Temp:  97.8 F (36.6 C)  SpO2: 99% 99%    Filed Weights   04/17/17 0707 04/17/17 1056  Weight: 54.4 kg (120 lb) 54.1 kg (119 lb 4.3 oz)    ROS: Review of Systems  Constitutional: Negative for chills and fever.  Eyes: Negative for blurred vision.  Respiratory: Negative for cough and shortness of breath.   Cardiovascular: Negative for chest pain.  Gastrointestinal: Negative for abdominal pain, constipation, diarrhea, nausea and vomiting.  Genitourinary: Negative for dysuria.  Musculoskeletal: Negative for joint pain.  Neurological: Negative for dizziness and headaches.   Exam: Physical Exam  Constitutional: He is oriented to person, place, and time.  HENT:  Nose: No mucosal edema.  Mouth/Throat: No oropharyngeal exudate or posterior oropharyngeal edema.  Poor dentition throughout the entire mouth.  Eyes: Pupils are equal, round, and reactive to light. Conjunctivae, EOM and lids are normal.  Neck: No JVD present. Carotid bruit is not present. No edema present. No thyroid mass and no thyromegaly present.  Cardiovascular: S1 normal and S2 normal.  Exam reveals no gallop.   No murmur heard. Pulses:      Dorsalis pedis pulses are 2+ on the right side, and 2+ on the left side.  Respiratory: No respiratory distress. He has no wheezes. He has no rhonchi. He has no rales.  GI: Soft. Bowel sounds are normal. There is no tenderness.  Musculoskeletal:       Right ankle: He exhibits no  swelling.       Left ankle: He exhibits no swelling.  Lymphadenopathy:    He has no cervical adenopathy.  Neurological: He is alert and oriented to person, place, and time. No cranial nerve deficit.  Skin: Skin is warm. No rash noted. Nails show no clubbing.  Psychiatric: He has a normal mood and affect.      Data Reviewed: Basic Metabolic Panel:  Recent Labs Lab 04/17/17 1441 04/17/17 1900 04/17/17 2248 04/18/17 0402 04/19/17 0410  NA 136 136 135 140 135  K 4.1 4.3 3.6 3.3* 4.2  CL 106 108 108 111 97*  CO2 23 18* 19* 24 27  GLUCOSE 140* 176* 277* 103* 498*  BUN CREATININE 0.71 0.55* 0.74 0.44* 0.71  CALCIUM 8.0* 8.1* 7.8* 8.1* 8.7*   Liver Function Tests:  Recent Labs Lab 04/18/17 0402  AST 48*  ALT 42  ALKPHOS 142*  BILITOT 0.4  PROT 5.5*  ALBUMIN 2.7*   CBC:  Recent Labs Lab 04/17/17 0725 04/18/17 0402  WBC 8.1 7.0  NEUTROABS 5.4  --   HGB 13.6 12.4*  HCT 41.0 36.0*  MCV 91.8 88.8  PLT 242 211    CBG:  Recent Labs Lab 04/19/17 1159 04/19/17 1609 04/19/17 2201 04/20/17 0735 04/20/17 1144  GLUCAP 230* 438* 485* 443* 305*    Recent Results (from the past 240 hour(s))  MRSA PCR Screening     Status:  Abnormal   Collection Time: 04/17/17 12:29 PM  Result Value Ref Range Status   MRSA by PCR POSITIVE (A) NEGATIVE Final    Comment:        The GeneXpert MRSA Assay (FDA approved for NASAL specimens only), is one component of a comprehensive MRSA colonization surveillance program. It is not intended to diagnose MRSA infection nor to guide or monitor treatment for MRSA infections. RESULT CALLED TO, READ BACK BY AND VERIFIED WITH: AMELIA BERRY AT 1558 04/17/2017 BY TFK.       Scheduled Meds: . clindamycin  300 mg Oral Q8H  . enoxaparin (LOVENOX) injection  40 mg Subcutaneous Q24H  . insulin aspart  0-5 Units Subcutaneous QHS  . insulin aspart  0-9 Units Subcutaneous TID WC  . insulin aspart protamine- aspart  42 Units  Subcutaneous BID WC  . nicotine  21 mg Transdermal Daily  . pantoprazole  40 mg Oral Daily    Assessment/Plan:  1. Diabetic ketoacidosis. This has resolved but the patient still has hyperglycemia. Increase 70/30 insulin to 42 units twice a day. Sliding scale insulin on top of that.  Hemoglobin A1c 10.6.  Noncompliance likely part of the cause.  I did speak to the patient at length about diet and compliance with medications 2. Abscessed teeth. Clindamycin started. Patient will need to get [facility] clinic as outpatient to handle his teeth. 3. Teeth pain and abdominal pain. Discontinue morphine.  As needed oxycodone while here in the hospital only. 4. History of hepatitis C 5. Tobacco abuse on nicotine patch  Care manager unable to set him up with medications to be discharged today because he isn't over utilize her of this free medication on the weekends. Patient will have to go to the medication clinic on Monday. Unsafe to discharge without insulin.  Code Status:     Code Status Orders        Start     Ordered   04/17/17 1056  Full code  Continuous     04/17/17 1055    Code Status History    Date Active Date Inactive Code Status Order ID Comments User Context   02/03/2017  1:36 PM 02/04/2017  9:28 PM Full Code [id number]  Gracelyn Nurse, MD ED   10/13/2016  5:26 AM 10/15/2016  6:12 PM Full Code [id number]  Arnaldo Natal, MD Inpatient   09/08/2016  3:52 AM 09/08/2016  6:37 PM Full Code [id number]  Hugelmeyer, Alexis, DO Inpatient   08/12/2016 10:20 PM 08/13/2016  5:10 PM Full Code [id number]  Katharina Caper, MD Inpatient   07/04/2016 12:24 PM 07/04/2016 12:24 PM Full Code [id number]  Altamese Dilling, MD Inpatient   07/04/2016 12:24 PM 07/05/2016  7:36 PM Full Code [id number]  Altamese Dilling, MD Inpatient   04/09/2016  4:54 PM 04/11/2016  2:09 PM Full Code [id number]  Katharina Caper, MD Inpatient   03/26/2016  1:18 AM 03/27/2016  7:11 PM Full Code [id number]  Hugelmeyer, Jon Gills, DO  Inpatient   03/18/2016  8:15 AM 03/21/2016  8:35 PM Full Code [id number]  Wyatt Haste, MD ED   03/12/2016 12:20 PM 03/16/2016  7:27 PM Full Code [id number]  Enedina Finner, MD Inpatient   02/18/2016  4:59 PM 02/19/2016  5:18 PM Full Code [id number]  Ramonita Lab, MD ED   01/12/2016  8:58 AM 01/15/2016  7:33 PM Full Code [id number]  Milagros Loll, MD ED   01/11/2016 12:35 AM 01/11/2016 11:20 PM Full Code [id number]  Gery Pray, MD Inpatient  12/12/2015 10:04 PM 12/14/2015  6:38 PM Full Code [id number]  Katharina Caper, MD Inpatient   10/23/2015  5:42 PM 10/25/2015  7:04 PM Full Code [id number]  Katha Hamming, MD ED   06/10/2015  8:20 AM 06/12/2015  2:41 PM Full Code [id number]  Arnaldo Natal, MD Inpatient   06/06/2015  8:43 PM 06/07/2015  4:20 PM Full Code [id number]  Enedina Finner, MD Inpatient   04/07/2015 12:45 PM 04/08/2015  8:37 PM Full Code [id number]  Altamese Dilling, MD Inpatient   12/09/2014  2:44 PM 12/13/2014  4:16 PM Full Code [id number]  Gale Journey, MD Inpatient      Disposition Plan: He will go home tomorrow  Antibiotics:  clindamycin  Time spent: 28 minutes  Alford Highland  [facility]

## 2017-04-20 NOTE — Progress Notes (Signed)
Patient alert and oriented. In room eating dinner tray. 3 units of insulin given and 42 units of 70/30. On contact precautions for MRSA in the nares. Pt on Tele. Complains of mouth pain from poor dental hygiene. Pt calls out for PRN oxycodone every 4 hours. IVs saline locked. No other complaints at this time. Will continue to monitor

## 2017-04-20 NOTE — Care Management Note (Signed)
Case Management Note  Patient Details  Name: Trevor Brown MRN: [id number] Date of Birth: 06-Dec-1990  Subjective/Objective:        Mr Graca is out of his insulin as is his usual presentation, and has used [facility] multiple times this year. [facility] would not accept            Action/Plan:   Expected Discharge Date:  04/20/17               Expected Discharge Plan:     In-House Referral:     Discharge planning Services     Post Acute Care Choice:    Choice offered to:     DME Arranged:    DME Agency:     HH Arranged:    HH Agency:     Status of Service:     If discussed at [employer], dates discussed:    Additional Comments:  Mateusz Neilan A, RN 04/20/2017, 11:21 AM

## 2017-04-21 LAB — GLUCOSE, CAPILLARY
Glucose-Capillary: >600 mg/dL (ref 65–99)
Glucose-Capillary: 229 mg/dL — ABNORMAL HIGH (ref 65–99)
Glucose-Capillary: 337 mg/dL — ABNORMAL HIGH (ref 65–99)

## 2017-04-21 LAB — BASIC METABOLIC PANEL
Anion gap: 10 (ref 5–15)
BUN: 19 mg/dL (ref 6–20)
CO2: 28 mmol/L (ref 22–32)
Calcium: 8.9 mg/dL (ref 8.9–10.3)
Chloride: 99 mmol/L — ABNORMAL LOW (ref 101–111)
Creatinine, Ser: 0.69 mg/dL (ref 0.61–1.24)
GFR calc Af Amer: >60 mL/min (ref >60)
GFR calc non Af Amer: >60 mL/min (ref >60)
Glucose, Bld: 123 mg/dL — ABNORMAL HIGH (ref 65–99)
Potassium: 4.1 mmol/L (ref 3.5–5.1)
Sodium: 137 mmol/L (ref 135–145)

## 2017-04-21 MED ORDER — INSULIN ASPART PROT & ASPART (70-30 MIX) 100 UNIT/ML ~~LOC~~ SUSP: SUBCUTANEOUS | 0 refills | Status: DC

## 2017-04-21 MED ORDER — POLYETHYLENE GLYCOL 3350 17 G PO PACK: 17.0000 g | PACK | Freq: Every day | ORAL | 0 refills | Status: DC | PRN

## 2017-04-21 MED ORDER — INSULIN ASPART 100 UNIT/ML ~~LOC~~ SOLN: SUBCUTANEOUS | 1 refills | Status: DC

## 2017-04-21 MED ORDER — INSULIN ASPART PROT & ASPART (70-30 MIX) 100 UNIT/ML ~~LOC~~ SUSP
42.0000 [IU] | Freq: Every day | SUBCUTANEOUS | Status: DC
Start: 2017-04-22 — End: 2017-04-21

## 2017-04-21 MED ORDER — INSULIN ASPART PROT & ASPART (70-30 MIX) 100 UNIT/ML ~~LOC~~ SUSP: 34.0000 [IU] | Freq: Every day | SUBCUTANEOUS | Status: DC

## 2017-04-21 MED ORDER — INSULIN ASPART PROT & ASPART (70-30 MIX) 100 UNIT/ML ~~LOC~~ SUSP
42.0000 [IU] | Freq: Every day | SUBCUTANEOUS | Status: DC
  Administered 2017-04-21: 42 [IU] via SUBCUTANEOUS
  Filled 2017-04-21 (×2): qty 10

## 2017-04-21 MED ORDER — POLYETHYLENE GLYCOL 3350 17 G PO PACK
17.0000 g | PACK | Freq: Every day | ORAL | Status: DC
  Administered 2017-04-21: 17 g via ORAL
  Filled 2017-04-21: qty 1

## 2017-04-21 NOTE — Discharge Planning (Signed)
Patient IV x2  And tele removed.  DC papers given, explained and educated.  RN assessment and VS revealed stability for DC to friend's home (per patient).  Scripts printed and given to be filled at [facility] clinic.  Informed of suggested FU appts and agrees to contact or walk-in to complete - since neither allows for appts.  Once ready, will be walked to front and taking public transit where needs to go, since does not have anyone to pick him up at this time.

## 2017-04-21 NOTE — Discharge Summary (Signed)
[facility] - [place] at [facility]   PATIENT NAME: Trevor Brown    MR#:  [id number]  DATE OF BIRTH:  15-Feb-1991  DATE OF ADMISSION:  04/17/2017 ADMITTING PHYSICIAN: Ramonita Lab, MD  DATE OF DISCHARGE: 04/21/2017 12:53 PM  PRIMARY CARE PHYSICIAN: [facility]   ADMISSION DIAGNOSIS:  Hyperglycemia [R73.9] Diabetic ketoacidosis without coma associated with type 1 diabetes mellitus (HCC) [E10.10]  DISCHARGE DIAGNOSIS:  Active Problems:   DKA (diabetic ketoacidoses) (HCC)   SECONDARY DIAGNOSIS:   Past Medical History:  Diagnosis Date  . Diabetes mellitus without complication (HCC)   . Heart attack (HCC)    Pt claims he had heart attack a year ago ( 2015)- and was admitted in [facility] for that, but not given any meds or angiogram, on review of chart- I could not find any details like that.  . Hepatitis C, acute may 2016  . Hypertension     HOSPITAL COURSE:   1. Diabetic ketoacidosis. This is secondary to noncompliance with insulin regimen. He ran out of his insulin a week prior to admission. He was on insulin drip and then switched over to 70/30 insulin. It took a while to control him on 70/30 insulin. We are unable to send him home over the weekend because he is an over utilize her of this free medication over the weekend. Had a be set up with medication management on Monday. 42 units in the morning and 34 units in the evening.  I explained to him if he runs out of insulin he will end up in the ER every time. He could potentially die if he does not take his insulin. 2. Tooth abscess. Clindamycin prescribed. All of his teeth need to be pulled and referred over to [facility] dentistry 3. Abdominal pain. Could be secondary to constipation with pain medications. 4. History of hepatitis C 5. Tobacco abuse on nicotine patch  DISCHARGE CONDITIONS:   Fair  CONSULTS OBTAINED:   seen by critical care specialist when in ICU.  DRUG ALLERGIES:   Allergies  Allergen Reactions   . Bee Venom Anaphylaxis  . Penicillins Anaphylaxis, Hives and Other (See Comments)    Has patient had a PCN reaction causing immediate rash, facial/tongue/throat swelling, SOB or lightheadedness with hypotension: Yes Has patient had a PCN reaction causing severe rash involving mucus membranes or skin necrosis: No Has patient had a PCN reaction that required hospitalization No Has patient had a PCN reaction occurring within the last 10 years: Yes If all of the above answers are "NO", then may proceed with Cephalosporin use.  . Ibuprofen Other (See Comments)    Other reaction(s): Other (See Comments) Stomach upset Reaction:  GI upset   . Tramadol Hives and Swelling  . Vancomycin Rash and Other (See Comments)    Reaction:  Red man's syndrome     DISCHARGE MEDICATIONS:   Discharge Medication List as of 04/21/2017 12:32 PM    START taking these medications   Details  clindamycin (CLEOCIN) 300 MG capsule Take 1 capsule (300 mg total) by mouth every 8 (eight) hours., Starting Sun 04/20/2017, Print    nicotine (NICODERM CQ - DOSED IN MG/24 HOURS) 21 mg/24hr patch Place 1 patch (21 mg total) onto the skin daily., Starting Mon 04/21/2017, Print    polyethylene glycol (MIRALAX / GLYCOLAX) packet Take 17 g by mouth daily as needed for moderate constipation., Starting Mon 04/21/2017, Print      CONTINUE these medications which have CHANGED   Details  insulin aspart (NOVOLOG) 100 UNIT/ML injection Give 5 units subcutaneously before meals if sugar 120- 250;  Give 8 units if sugar 251- 350; give 10 units if sugar greater than 351, Print    insulin aspart protamine- aspart (NOVOLOG MIX 70/30) (70-30) 100 UNIT/ML injection 42 units subcutaneous injection with breakfast, 34 units subcutaneous injection with dinner, Print      STOP taking these medications     pantoprazole (PROTONIX) 40 MG tablet          DISCHARGE INSTRUCTIONS:   [facility]  If you  experience worsening of your admission symptoms, develop shortness of breath, life threatening emergency, suicidal or homicidal thoughts you must seek medical attention immediately by calling 911 or calling your MD immediately  if symptoms less severe.  You Must read complete instructions/literature along with all the possible adverse reactions/side effects for all the Medicines you take and that have been prescribed to you. Take any new Medicines after you have completely understood and accept all the possible adverse reactions/side effects.   Please note  You were cared for by a hospitalist during your hospital stay. If you have any questions about your discharge medications or the care you received while you were in the hospital after you are discharged, you can call the unit and asked to speak with the hospitalist on call if the hospitalist that took care of you is not available. Once you are discharged, your primary care physician will handle any further medical issues. Please note that NO REFILLS for any discharge medications will be authorized once you are discharged, as it is imperative that you return to your primary care physician (or establish a relationship with a primary care physician if you do not have one) for your aftercare needs so that they can reassess your need for medications and monitor your lab values.    Today   CHIEF COMPLAINT:   Chief Complaint  Patient presents with  . Medication Refill    HISTORY OF PRESENT ILLNESS:  Trevor Brown  is a 26 y.o. male with a known history of Diabetes presented to the ER for medication refill and found to be in diabetic ketoacidosis   VITAL SIGNS:  Blood pressure 138/75, pulse 79, temperature 97.9 F (36.6 C), temperature source Axillary, resp. rate 18, height  (1.626 m), weight 54.1 kg (119 lb 4.3 oz), SpO2 99 %.   PHYSICAL EXAMINATION:  GENERAL:  26 y.o.-year-old patient lying in the bed with no acute distress.  EYES:  Pupils equal, round, reactive to light and accommodation. No scleral icterus. Extraocular muscles intact.  HEENT: Head atraumatic, normocephalic. Oropharynx and nasopharynx clear.  NECK:  Supple, no jugular venous distention. No thyroid enlargement, no tenderness.  LUNGS: Normal breath sounds bilaterally, no wheezing, rales,rhonchi or crepitation. No use of accessory muscles of respiration.  CARDIOVASCULAR: S1, S2 normal. No murmurs, rubs, or gallops.  ABDOMEN: Soft, non-tender, non-distended. Bowel sounds present. No organomegaly or mass.  EXTREMITIES: No pedal edema, cyanosis, or clubbing.  NEUROLOGIC: Cranial nerves II through XII are intact. Muscle strength 5/5 in all extremities. Sensation intact. Gait not checked.  PSYCHIATRIC: The patient is alert and oriented x 3.  SKIN: No obvious rash, lesion, or ulcer.   DATA REVIEW:   CBC  Recent Labs Lab 04/18/17 0402  WBC 7.0  HGB 12.4*  HCT 36.0*  PLT 211    Chemistries   Recent Labs Lab 04/18/17 0402  04/21/17 0338  NA 140  < >  137  K 3.3*  < > 4.1  CL 111  < > 99*  CO2 24  < > 28  GLUCOSE 103*  < > 123*  BUN 9  < > 19  CREATININE 0.44*  < > 0.69  CALCIUM 8.1*  < > 8.9  AST 48*  --   --   ALT 42  --   --   ALKPHOS 142*  --   --   BILITOT 0.4  --   --   < > = values in this interval not displayed.   Microbiology Results  Results for orders placed or performed during the hospital encounter of 04/17/17  MRSA PCR Screening     Status: Abnormal   Collection Time: 04/17/17 12:29 PM  Result Value Ref Range Status   MRSA by PCR POSITIVE (A) NEGATIVE Final    Comment:        The GeneXpert MRSA Assay (FDA approved for NASAL specimens only), is one component of a comprehensive MRSA colonization surveillance program. It is not intended to diagnose MRSA infection nor to guide or monitor treatment for MRSA infections. RESULT CALLED TO, READ BACK BY AND VERIFIED WITH: AMELIA BERRY AT 1558 04/17/2017 BY TFK.         Management plans discussed with the patient, And he is in agreement.  CODE STATUS:  Code Status History    Date Active Date Inactive Code Status Order ID Comments User Context   04/17/2017 10:55 AM 04/21/2017  3:59 PM Full Code [id number]  Ramonita Lab, MD Inpatient   02/03/2017  1:36 PM 02/04/2017  9:28 PM Full Code [id number]  Gracelyn Nurse, MD ED   10/13/2016  5:26 AM 10/15/2016  6:12 PM Full Code [id number]  Arnaldo Natal, MD Inpatient   09/08/2016  3:52 AM 09/08/2016  6:37 PM Full Code [id number]  Hugelmeyer, Alexis, DO Inpatient   08/12/2016 10:20 PM 08/13/2016  5:10 PM Full Code [id number]  Katharina Caper, MD Inpatient   07/04/2016 12:24 PM 07/04/2016 12:24 PM Full Code [id number]  Altamese Dilling, MD Inpatient   07/04/2016 12:24 PM 07/05/2016  7:36 PM Full Code [id number]  Altamese Dilling, MD Inpatient   04/09/2016  4:54 PM 04/11/2016  2:09 PM Full Code [id number]  Katharina Caper, MD Inpatient   03/26/2016  1:18 AM 03/27/2016  7:11 PM Full Code [id number]  Hugelmeyer, Alexis, DO Inpatient   03/18/2016  8:15 AM 03/21/2016  8:35 PM Full Code [id number]  Wyatt Haste, MD ED   03/12/2016 12:20 PM 03/16/2016  7:27 PM Full Code [id number]  Enedina Finner, MD Inpatient   02/18/2016  4:59 PM 02/19/2016  5:18 PM Full Code [id number]  Ramonita Lab, MD ED   01/12/2016  8:58 AM 01/15/2016  7:33 PM Full Code [id number]  Milagros Loll, MD ED   01/11/2016 12:35 AM 01/11/2016 11:20 PM Full Code [id number]  Gery Pray, MD Inpatient   12/12/2015 10:04 PM 12/14/2015  6:38 PM Full Code [id number]  Katharina Caper, MD Inpatient   10/23/2015  5:42 PM 10/25/2015  7:04 PM Full Code [id number]  Katha Hamming, MD ED   06/10/2015  8:20 AM 06/12/2015  2:41 PM Full Code [id number]  Arnaldo Natal, MD Inpatient   06/06/2015  8:43 PM 06/07/2015  4:20 PM Full Code [id number]  Enedina Finner, MD Inpatient   04/07/2015 12:45 PM 04/08/2015  8:37 PM Full Code [id number]  Altamese Dilling, MD Inpatient   12/09/2014  2:44 PM  12/13/2014  4:16 PM Full Code [id number]  Gale Journey, MD Inpatient      TOTAL TIME TAKING CARE OF THIS PATIENT: 32 minutes.    Alford Highland M.D on 04/21/2017 at 4:20 PM  Between 7am to 6pm - Pager - [phone]  After 6pm go to [email or web address] - password [email or web address] [facility]  [facility] Office  [phone]  CC: Primary care physician; [facility]

## 2017-05-09 ENCOUNTER — Other Ambulatory Visit: Payer: Self-pay | Admitting: Internal Medicine

## 2017-06-03 ENCOUNTER — Inpatient Hospital Stay
Admission: EM | Admit: 2017-06-03 | Discharge: 2017-06-07 | DRG: 638 | Disposition: A | Discharge status: Home / Self Care | Payer: Self-pay | Attending: Internal Medicine | Admitting: Internal Medicine

## 2017-06-03 ENCOUNTER — Encounter: Payer: Self-pay | Admitting: Emergency Medicine

## 2017-06-03 DIAGNOSIS — Z833 Family history of diabetes mellitus: Secondary | ICD-10-CM

## 2017-06-03 DIAGNOSIS — Z794 Long term (current) use of insulin: Secondary | ICD-10-CM

## 2017-06-03 DIAGNOSIS — E860 Dehydration: Secondary | ICD-10-CM | POA: Diagnosis present

## 2017-06-03 DIAGNOSIS — N179 Acute kidney failure, unspecified: Secondary | ICD-10-CM | POA: Diagnosis present

## 2017-06-03 DIAGNOSIS — G8929 Other chronic pain: Secondary | ICD-10-CM | POA: Diagnosis present

## 2017-06-03 DIAGNOSIS — F1190 Opioid use, unspecified, uncomplicated: Secondary | ICD-10-CM | POA: Diagnosis present

## 2017-06-03 DIAGNOSIS — Z590 Homelessness: Secondary | ICD-10-CM

## 2017-06-03 DIAGNOSIS — E1110 Type 2 diabetes mellitus with ketoacidosis without coma: Secondary | ICD-10-CM | POA: Diagnosis present

## 2017-06-03 DIAGNOSIS — Z8489 Family history of other specified conditions: Secondary | ICD-10-CM

## 2017-06-03 DIAGNOSIS — Z881 Allergy status to other antibiotic agents status: Secondary | ICD-10-CM

## 2017-06-03 DIAGNOSIS — Z91030 Bee allergy status: Secondary | ICD-10-CM

## 2017-06-03 DIAGNOSIS — F17210 Nicotine dependence, cigarettes, uncomplicated: Secondary | ICD-10-CM | POA: Diagnosis present

## 2017-06-03 DIAGNOSIS — E1010 Type 1 diabetes mellitus with ketoacidosis without coma: Principal | ICD-10-CM | POA: Diagnosis present

## 2017-06-03 DIAGNOSIS — E871 Hypo-osmolality and hyponatremia: Secondary | ICD-10-CM | POA: Diagnosis present

## 2017-06-03 DIAGNOSIS — E875 Hyperkalemia: Secondary | ICD-10-CM | POA: Diagnosis present

## 2017-06-03 DIAGNOSIS — I10 Essential (primary) hypertension: Secondary | ICD-10-CM | POA: Diagnosis present

## 2017-06-03 DIAGNOSIS — K029 Dental caries, unspecified: Secondary | ICD-10-CM | POA: Diagnosis present

## 2017-06-03 DIAGNOSIS — Z9119 Patient's noncompliance with other medical treatment and regimen: Secondary | ICD-10-CM

## 2017-06-03 DIAGNOSIS — B1920 Unspecified viral hepatitis C without hepatic coma: Secondary | ICD-10-CM | POA: Diagnosis present

## 2017-06-03 DIAGNOSIS — Z886 Allergy status to analgesic agent status: Secondary | ICD-10-CM

## 2017-06-03 DIAGNOSIS — Z880 Allergy status to penicillin: Secondary | ICD-10-CM

## 2017-06-03 LAB — URINALYSIS, COMPLETE (UACMP) WITH MICROSCOPIC
Bacteria, UA: NONE SEEN
Bilirubin Urine: NEGATIVE
Glucose, UA: >=500 mg/dL — AB
Hgb urine dipstick: NEGATIVE
Ketones, ur: 20 mg/dL — AB
Leukocytes, UA: NEGATIVE
Nitrite: NEGATIVE
Protein, ur: NEGATIVE mg/dL
Specific Gravity, Urine: 1.024 (ref 1.005–1.030)
Squamous Epithelial / LPF: NONE SEEN
WBC, UA: NONE SEEN WBC/hpf (ref 0–5)
pH: 6 (ref 5.0–8.0)

## 2017-06-03 LAB — CBC
HCT: 42.3 % (ref 40.0–52.0)
Hemoglobin: 13.3 g/dL (ref 13.0–18.0)
MCH: 30 pg (ref 26.0–34.0)
MCHC: 31.4 g/dL — ABNORMAL LOW (ref 32.0–36.0)
MCV: 95.5 fL (ref 80.0–100.0)
Platelets: 332 10*3/uL (ref 150–440)
RBC: 4.43 MIL/uL (ref 4.40–5.90)
RDW: 13.9 % (ref 11.5–14.5)
WBC: 6.9 10*3/uL (ref 3.8–10.6)

## 2017-06-03 LAB — GLUCOSE, CAPILLARY: Glucose-Capillary: >600 mg/dL (ref 65–99)

## 2017-06-03 MED ORDER — SODIUM CHLORIDE 0.9 % IV BOLUS (SEPSIS)
1000.0000 mL | Freq: Once | INTRAVENOUS | Status: AC
  Administered 2017-06-03: 1000 mL via INTRAVENOUS

## 2017-06-03 MED ORDER — LIDOCAINE VISCOUS 2 % MT SOLN
15.0000 mL | Freq: Once | OROMUCOSAL | Status: AC
Start: 2017-06-04 — End: 2017-06-03
  Administered 2017-06-03: 15 mL via OROMUCOSAL

## 2017-06-03 MED ORDER — LIDOCAINE VISCOUS 2 % MT SOLN
OROMUCOSAL | Status: AC
  Administered 2017-06-03: 15 mL via OROMUCOSAL
  Filled 2017-06-03: qty 15

## 2017-06-03 NOTE — ED Triage Notes (Signed)
Pt reports history of type 1 DM, reports blood glucose running high at home for the past 3 days. Pt reports having abdominal cramping, deneis any N/V/D. Reports feeling dizzy. Pt talks in complete sentences no respiratory distress noted

## 2017-06-03 NOTE — ED Provider Notes (Signed)
[facility] Emergency Department Provider Note   First MD Initiated Contact with Patient 06/03/17 2334     (approximate)  I have reviewed the triage vital signs and the nursing notes.   HISTORY  Chief Complaint Hyperglycemia    HPI Trevor Brown is a 26 y.o. male with below list of chronic medical conditions presents to the emergency department with hyperglycemia which patient noted at home stating that noted to be high on his monitor.  Patient states that he has been out of his insulin for the past 2 days.  Patient denies poor p.o. intake secondary to homelessness.  Patient denies any recent illness.  Patient denies any illicit drug use stating that  Past Medical History:  Diagnosis Date  . Diabetes mellitus without complication (HCC)   . Heart attack (HCC)    Pt claims he had heart attack a year ago ( 2015)- and was admitted in [facility] for that, but not given any meds or angiogram, on review of chart- I could not find any details like that.  . Hepatitis C, acute may 2016  . Hypertension     Patient Active Problem List   Diagnosis Date Noted  . Dental erosion extending into pulp 12/31/2016  . 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 Elevated transaminase level 03/27/2016  . Diabetes mellitus type 1 (HCC) 03/27/2016  . Tobacco abuse counseling 03/27/2016  . Transaminitis 03/25/2016  . Tobacco abuse 01/10/2016  . Type 1 diabetes mellitus with hyperglycemia (HCC) 12/12/2015  . Cocaine abuse (HCC) 12/12/2015  . DKA (diabetic ketoacidoses) (HCC) 06/10/2015  . Hidradenitis suppurativa of left axilla   . DKA, type 1 (HCC) 06/06/2015  . Malnutrition of moderate degree (HCC) 04/08/2015  . Hepatitis C 12/13/2014  . Diabetes type 1, uncontrolled (HCC) 12/11/2014  . Major depressive disorder, single episode, mild (HCC)   . Major depression, single episode 12/10/2014  . Diabetes mellitus type 1, uncontrolled (HCC) 12/09/2014  . Homelessness 12/09/2014    History  reviewed. No pertinent surgical history.  Prior to Admission medications   Medication Sig Start Date End Date Taking? Authorizing Provider  clindamycin (CLEOCIN) 300 MG capsule Take 1 capsule (300 mg total) by mouth every 8 (eight) hours. 04/20/17   Alford Highland, MD  insulin aspart (NOVOLOG) 100 UNIT/ML injection Give 5 units subcutaneously before meals if sugar 120- 250;  Give 8 units if sugar 251- 350; give 10 units if sugar greater than 351 04/21/17   Wieting, Richard, MD  insulin aspart protamine- aspart (NOVOLOG MIX 70/30) (70-30) 100 UNIT/ML injection 42 units subcutaneous injection with breakfast, 34 units subcutaneous injection with dinner 04/21/17   Renae Gloss, Richard, MD  nicotine (NICODERM CQ - DOSED IN MG/24 HOURS) 21 mg/24hr patch Place 1 patch (21 mg total) onto the skin daily. 04/21/17   Alford Highland, MD  polyethylene glycol (MIRALAX / GLYCOLAX) packet Take 17 g by mouth daily as needed for moderate constipation. 04/21/17   Alford Highland, MD    Allergies Bee venom; Penicillins; Ibuprofen; Tramadol; and Vancomycin  Family History  Problem Relation Age of Onset  . Cirrhosis Mother   . Diabetes Mellitus II Maternal Grandmother     Social History Social History   Tobacco Use  . Smoking status: Current Every Day Smoker    Packs/day: 2.00    Types: Cigarettes  . Smokeless tobacco: Never Used  Substance Use Topics  . Alcohol use: No    Alcohol/week: 0.0 oz  . Drug use: Yes    Types:  IV    Comment: heroin    Review of Systems Constitutional: No fever/chills Eyes: No visual changes. ENT: No sore throat. Cardiovascular: Denies chest pain. Respiratory: Denies shortness of breath. Gastrointestinal: No abdominal pain.  No nausea, no vomiting.  No diarrhea.  No constipation. Genitourinary: Negative for dysuria. Musculoskeletal: Negative for neck pain.  Negative for back pain. Integumentary: Negative for rash. Neurological: Negative for headaches, focal weakness or  numbness. Endocrine:Positive for hyperglycemia  ____________________________________________   PHYSICAL EXAM:  VITAL SIGNS: ED Triage Vitals  Enc Vitals Group     BP 06/03/17 2157 136/77     Pulse Rate 06/03/17 2157 84     Resp 06/03/17 2157 20     Temp 06/03/17 2157 98.7 F (37.1 C)     Temp Source 06/03/17 2157 Oral     SpO2 06/03/17 2157 96 %     Weight 06/03/17 2159 54.4 kg (120 lb)     Height 06/03/17 2159 1.626 m (5\' 4" )     Head Circumference --      Peak Flow --      Pain Score 06/03/17 2157 9     Pain Loc --      Pain Edu? --      Excl. in GC? --     Constitutional: Alert and oriented. Well appearing and in no acute distress. Eyes: Conjunctivae are normal. PERRL. EOMI. Head: Atraumatic. Mouth/Throat: Mucous membranes are dry.  Oropharynx non-erythematous. Neck: No stridor.   Cardiovascular: Normal rate, regular rhythm. Good peripheral circulation. Grossly normal heart sounds. Respiratory: Normal respiratory effort.  No retractions. Lungs CTAB. Gastrointestinal: Soft and nontender. No distention.  Musculoskeletal: No lower extremity tenderness nor edema. No gross deformities of extremities. Neurologic:  Normal speech and language. No gross focal neurologic deficits are appreciated.  Skin:  Skin is warm, dry and intact. No rash noted. Psychiatric: Mood and affect are normal. Speech and behavior are normal.  ____________________________________________   LABS (all labs ordered are listed, but only abnormal results are displayed)  Labs Reviewed  BASIC METABOLIC PANEL - Abnormal; Notable for the following components:      Result Value   Sodium 127 (*)    Potassium 5.2 (*)    Chloride 85 (*)    CO2 20 (*)    Glucose, Bld 621 (*)    BUN 33 (*)    Creatinine, Ser 1.47 (*)    Anion gap 22 (*)    All other components within normal limits  CBC - Abnormal; Notable for the following components:   MCHC 31.4 (*)    All other components within normal limits    GLUCOSE, CAPILLARY - Abnormal; Notable for the following components:   Glucose-Capillary >600 (*)    All other components within normal limits  URINALYSIS, COMPLETE (UACMP) WITH MICROSCOPIC  CBG MONITORING, ED      PROCEDURES  Critical Care performed:CRITICAL CARE Performed by: Darci CurrentANDOLPH N BROWN   Total critical care time: 30 minutes  Critical care time was exclusive of separately billable procedures and treating other patients.  Critical care was necessary to treat or prevent imminent or life-threatening deterioration.  Critical care was time spent personally by me on the following activities: development of treatment plan with patient and/or surrogate as well as nursing, discussions with consultants, evaluation of patient's response to treatment, examination of patient, obtaining history from patient or surrogate, ordering and performing treatments and interventions, ordering and review of laboratory studies, ordering and review of radiographic studies, pulse oximetry and  re-evaluation of patient's condition.  Procedures   ____________________________________________   INITIAL IMPRESSION / ASSESSMENT AND PLAN / ED COURSE  As part of my medical decision making, I reviewed the following data within the electronic MEDICAL RECORD 89 year old male presenting with history of physical exam concerning for DKA.  Laboratory data consistent with just that with a glucose of 621 anion gap of 22.  Patient received 2 L IV normal saline followed by insulin infusion.  Patient discussed with Dr. Anne HahnWillis for hospital admission for further evaluation and     ____________________________________________  FINAL CLINICAL IMPRESSION(S) / ED DIAGNOSES  Final diagnoses:  Diabetic ketoacidosis without coma associated with type 1 diabetes mellitus (HCC)     MEDICATIONS GIVEN DURING THIS VISIT:  Medications  sodium chloride 0.9 % bolus 1,000 mL (0 mLs Intravenous Stopped 06/03/17 2330)  sodium  chloride 0.9 % bolus 1,000 mL (1,000 mLs Intravenous New Bag/Given 06/03/17 2330)     ED Discharge Orders    None       Note:  This document was prepared using Dragon voice recognition software and may include unintentional dictation errors.    Darci CurrentBrown, [place] N, MD 06/04/17 0100

## 2017-06-04 ENCOUNTER — Encounter: Payer: Self-pay | Admitting: Internal Medicine

## 2017-06-04 DIAGNOSIS — E1010 Type 1 diabetes mellitus with ketoacidosis without coma: Principal | ICD-10-CM

## 2017-06-04 LAB — BASIC METABOLIC PANEL
Anion gap: 18 — ABNORMAL HIGH (ref 5–15)
Anion gap: 12 (ref 5–15)
Anion gap: 11 (ref 5–15)
Anion gap: 18 — ABNORMAL HIGH (ref 5–15)
BUN: 24 mg/dL — ABNORMAL HIGH (ref 6–20)
BUN: 21 mg/dL — ABNORMAL HIGH (ref 6–20)
BUN: 17 mg/dL (ref 6–20)
BUN: 19 mg/dL (ref 6–20)
CO2: 20 mmol/L — ABNORMAL LOW (ref 22–32)
CO2: 24 mmol/L (ref 22–32)
CO2: 21 mmol/L — ABNORMAL LOW (ref 22–32)
CO2: 17 mmol/L — ABNORMAL LOW (ref 22–32)
Calcium: 9.2 mg/dL (ref 8.9–10.3)
Calcium: 8.8 mg/dL — ABNORMAL LOW (ref 8.9–10.3)
Calcium: 8.4 mg/dL — ABNORMAL LOW (ref 8.9–10.3)
Calcium: 8.9 mg/dL (ref 8.9–10.3)
Chloride: 101 mmol/L (ref 101–111)
Chloride: 102 mmol/L (ref 101–111)
Chloride: 100 mmol/L — ABNORMAL LOW (ref 101–111)
Chloride: 93 mmol/L — ABNORMAL LOW (ref 101–111)
Creatinine, Ser: 1.38 mg/dL — ABNORMAL HIGH (ref 0.61–1.24)
Creatinine, Ser: 0.81 mg/dL (ref 0.61–1.24)
Creatinine, Ser: 0.8 mg/dL (ref 0.61–1.24)
Creatinine, Ser: 1.01 mg/dL (ref 0.61–1.24)
GFR calc Af Amer: >60 mL/min (ref >60)
GFR calc Af Amer: >60 mL/min (ref >60)
GFR calc Af Amer: >60 mL/min (ref >60)
GFR calc Af Amer: >60 mL/min (ref >60)
GFR calc non Af Amer: >60 mL/min (ref >60)
GFR calc non Af Amer: >60 mL/min (ref >60)
GFR calc non Af Amer: >60 mL/min (ref >60)
GFR calc non Af Amer: >60 mL/min (ref >60)
Glucose, Bld: 449 mg/dL — ABNORMAL HIGH (ref 65–99)
Glucose, Bld: 108 mg/dL — ABNORMAL HIGH (ref 65–99)
Glucose, Bld: 325 mg/dL — ABNORMAL HIGH (ref 65–99)
Glucose, Bld: 391 mg/dL — ABNORMAL HIGH (ref 65–99)
Potassium: 3.3 mmol/L — ABNORMAL LOW (ref 3.5–5.1)
Potassium: 3.5 mmol/L (ref 3.5–5.1)
Potassium: 4.3 mmol/L (ref 3.5–5.1)
Potassium: 4.2 mmol/L (ref 3.5–5.1)
Sodium: 139 mmol/L (ref 135–145)
Sodium: 138 mmol/L (ref 135–145)
Sodium: 132 mmol/L — ABNORMAL LOW (ref 135–145)
Sodium: 128 mmol/L — ABNORMAL LOW (ref 135–145)

## 2017-06-04 LAB — GLUCOSE, CAPILLARY
Glucose-Capillary: 116 mg/dL — ABNORMAL HIGH (ref 65–99)
Glucose-Capillary: 122 mg/dL — ABNORMAL HIGH (ref 65–99)
Glucose-Capillary: 125 mg/dL — ABNORMAL HIGH (ref 65–99)
Glucose-Capillary: 156 mg/dL — ABNORMAL HIGH (ref 65–99)
Glucose-Capillary: 176 mg/dL — ABNORMAL HIGH (ref 65–99)
Glucose-Capillary: 180 mg/dL — ABNORMAL HIGH (ref 65–99)
Glucose-Capillary: 236 mg/dL — ABNORMAL HIGH (ref 65–99)
Glucose-Capillary: 264 mg/dL — ABNORMAL HIGH (ref 65–99)
Glucose-Capillary: 289 mg/dL — ABNORMAL HIGH (ref 65–99)
Glucose-Capillary: 303 mg/dL — ABNORMAL HIGH (ref 65–99)
Glucose-Capillary: 312 mg/dL — ABNORMAL HIGH (ref 65–99)
Glucose-Capillary: 325 mg/dL — ABNORMAL HIGH (ref 65–99)
Glucose-Capillary: 379 mg/dL — ABNORMAL HIGH (ref 65–99)
Glucose-Capillary: 575 mg/dL (ref 65–99)
Glucose-Capillary: 576 mg/dL (ref 65–99)
Glucose-Capillary: 598 mg/dL (ref 65–99)
Glucose-Capillary: >600 mg/dL (ref 65–99)
Glucose-Capillary: >600 mg/dL (ref 65–99)

## 2017-06-04 LAB — MRSA PCR SCREENING: MRSA by PCR: POSITIVE — AB

## 2017-06-04 MED ORDER — INSULIN ASPART 100 UNIT/ML ~~LOC~~ SOLN
10.0000 [IU] | SUBCUTANEOUS | Status: AC
  Administered 2017-06-04: 10 [IU] via SUBCUTANEOUS
  Filled 2017-06-04: qty 1

## 2017-06-04 MED ORDER — INSULIN ASPART PROT & ASPART (70-30 MIX) 100 UNIT/ML ~~LOC~~ SUSP
20.0000 [IU] | Freq: Two times a day (BID) | SUBCUTANEOUS | Status: DC
  Administered 2017-06-04: 20 [IU] via SUBCUTANEOUS
  Filled 2017-06-04: qty 10

## 2017-06-04 MED ORDER — INSULIN ASPART 100 UNIT/ML ~~LOC~~ SOLN: 0.0000 [IU] | Freq: Every day | SUBCUTANEOUS | Status: DC

## 2017-06-04 MED ORDER — INSULIN GLARGINE 100 UNIT/ML ~~LOC~~ SOLN
7.0000 [IU] | Freq: Every day | SUBCUTANEOUS | Status: DC
  Administered 2017-06-04: 7 [IU] via SUBCUTANEOUS
  Filled 2017-06-04: qty 0.07

## 2017-06-04 MED ORDER — LIDOCAINE VISCOUS 2 % MT SOLN
15.0000 mL | OROMUCOSAL | Status: DC | PRN
  Administered 2017-06-04 – 2017-06-05 (×4): 15 mL via OROMUCOSAL
  Filled 2017-06-04 (×7): qty 15

## 2017-06-04 MED ORDER — MUPIROCIN 2 % EX OINT
1.0000 "application " | TOPICAL_OINTMENT | Freq: Two times a day (BID) | CUTANEOUS | Status: DC
  Administered 2017-06-05 – 2017-06-07 (×4): 1 via NASAL
  Filled 2017-06-04 (×2): qty 22

## 2017-06-04 MED ORDER — SODIUM CHLORIDE 0.9 % IV SOLN: INTRAVENOUS | Status: DC

## 2017-06-04 MED ORDER — CHLORHEXIDINE GLUCONATE CLOTH 2 % EX PADS
6.0000 | MEDICATED_PAD | Freq: Every day | CUTANEOUS | Status: DC
  Administered 2017-06-06 – 2017-06-07 (×2): 6 via TOPICAL

## 2017-06-04 MED ORDER — SODIUM CHLORIDE 0.9 % IV SOLN
INTRAVENOUS | Status: DC
  Administered 2017-06-04: 5.4 [IU]/h via INTRAVENOUS
  Filled 2017-06-04: qty 1

## 2017-06-04 MED ORDER — SODIUM CHLORIDE 0.9 % IV SOLN
INTRAVENOUS | Status: DC
  Administered 2017-06-04: 03:00:00 via INTRAVENOUS

## 2017-06-04 MED ORDER — IBUPROFEN 400 MG PO TABS
400.0000 mg | ORAL_TABLET | Freq: Four times a day (QID) | ORAL | Status: DC | PRN
  Administered 2017-06-04 – 2017-06-05 (×4): 400 mg via ORAL
  Filled 2017-06-04 (×4): qty 1

## 2017-06-04 MED ORDER — SODIUM CHLORIDE 0.9 % IV SOLN
INTRAVENOUS | Status: DC
  Administered 2017-06-04: 04:00:00 via INTRAVENOUS

## 2017-06-04 MED ORDER — DEXTROSE-NACL 5-0.45 % IV SOLN
INTRAVENOUS | Status: DC
  Administered 2017-06-04: 06:00:00 via INTRAVENOUS

## 2017-06-04 MED ORDER — INSULIN ASPART 100 UNIT/ML ~~LOC~~ SOLN
15.0000 [IU] | Freq: Once | SUBCUTANEOUS | Status: AC
  Administered 2017-06-04: 15 [IU] via SUBCUTANEOUS
  Filled 2017-06-04: qty 1

## 2017-06-04 MED ORDER — POTASSIUM CHLORIDE CRYS ER 20 MEQ PO TBCR
40.0000 meq | EXTENDED_RELEASE_TABLET | Freq: Two times a day (BID) | ORAL | Status: DC
  Administered 2017-06-04 (×2): 40 meq via ORAL
  Filled 2017-06-04 (×2): qty 2

## 2017-06-04 MED ORDER — ACETAMINOPHEN 325 MG PO TABS
650.0000 mg | ORAL_TABLET | Freq: Four times a day (QID) | ORAL | Status: DC | PRN
  Administered 2017-06-04: 650 mg via ORAL
  Filled 2017-06-04: qty 2

## 2017-06-04 MED ORDER — NICOTINE 21 MG/24HR TD PT24
21.0000 mg | MEDICATED_PATCH | Freq: Every day | TRANSDERMAL | Status: DC
  Administered 2017-06-04 – 2017-06-07 (×4): 21 mg via TRANSDERMAL
  Filled 2017-06-04 (×4): qty 1

## 2017-06-04 MED ORDER — INSULIN ASPART 100 UNIT/ML ~~LOC~~ SOLN: 0.0000 [IU] | Freq: Three times a day (TID) | SUBCUTANEOUS | Status: DC

## 2017-06-04 MED ORDER — ACETAMINOPHEN 325 MG PO TABS
650.0000 mg | ORAL_TABLET | Freq: Once | ORAL | Status: AC
  Administered 2017-06-04: 650 mg via ORAL
  Filled 2017-06-04: qty 2

## 2017-06-04 NOTE — Progress Notes (Signed)
eLink Physician-Brief Progress Note Patient Name: Trevor Brown DOB: Feb 04, 1991 MRN: [id number]   Date of Service  06/04/2017  HPI/Events of Note  [age] yo male admitted with DKA. PCCM already has seen the patient and has assumed care in the ICU. VSS.   eICU Interventions  No new orders.     Intervention Category Evaluation Type: New Patient Evaluation  Lenell AntuSommer,Ortencia Askari Eugene 06/04/2017, 3:16 AM

## 2017-06-04 NOTE — H&P (Signed)
[facility] [place] [facility]   PATIENT NAME: Trevor Brown    MR#:  [id number]  DATE OF BIRTH:  Mar 27, 1991  DATE OF ADMISSION:  06/03/2017  PRIMARY CARE PHYSICIAN: Patient, No Pcp Per   REQUESTING/REFERRING PHYSICIAN:   CHIEF COMPLAINT:   Chief Complaint  Patient presents with  . Hyperglycemia    HISTORY OF PRESENT ILLNESS: Trevor Knackony Givans  is a 26 y.o. male with a known history of insulin-dependent diabetes mellitus, hepatitis C, hypertension presented to the emergency room with elevated blood sugar. Patient check blood sugar at home was high. Blood sugar in the emergency room was 6 21 mg/dL. Patient had elevated anion gap and was in diabetic ketoacidosis. No complaints of any nausea and vomiting. Patient says he has ran out of his insulin medication. Patient was started on IV insulin drip in the emergency room and IV fluids and hospitalist service was consulted. No complaints of any chest pain, shortness of breath.  PAST MEDICAL HISTORY:   Past Medical History:  Diagnosis Date  . Diabetes mellitus without complication (HCC)   . Heart attack (HCC)    Pt claims he had heart attack a year ago ( 2015)- and [facility], but not given any meds or angiogram, on review of chart- I could not find any details like that.  . Hepatitis C, acute may 2016  . Hypertension     PAST SURGICAL HISTORY:  Past Surgical History:  Procedure Laterality Date  . none      SOCIAL HISTORY:  Social History   Tobacco Use  . Smoking status: Current Every Day Smoker    Packs/day: 2.00    Types: Cigarettes  . Smokeless tobacco: Never Used  Substance Use Topics  . Alcohol use: No    Alcohol/week: 0.0 oz    FAMILY HISTORY:  Family History  Problem Relation Age of Onset  . Cirrhosis Mother   . Diabetes Mellitus II Maternal Grandmother   . Diabetes Mellitus II Maternal Grandfather     DRUG ALLERGIES:  Allergies  Allergen Reactions  . Bee Venom  Anaphylaxis  . Penicillins Anaphylaxis, Hives and Other (See Comments)    Has patient had a PCN reaction causing immediate rash, facial/tongue/throat swelling, SOB or lightheadedness with hypotension: Yes Has patient had a PCN reaction causing severe rash involving mucus membranes or skin necrosis: No Has patient had a PCN reaction that required hospitalization No Has patient had a PCN reaction occurring within the last 10 years: Yes If all of the above answers are "NO", then may proceed with Cephalosporin use.  . Ibuprofen Other (See Comments)    Other reaction(s): Other (See Comments) Stomach upset Reaction:  GI upset   . Tramadol Hives and Swelling  . Vancomycin Rash and Other (See Comments)    Reaction:  Red man's syndrome     REVIEW OF SYSTEMS:   CONSTITUTIONAL: No fever, fatigue or weakness.  EYES: No blurred or double vision.  EARS, NOSE, AND THROAT: No tinnitus or ear pain.  RESPIRATORY: No cough, shortness of breath, wheezing or hemoptysis.  CARDIOVASCULAR: No chest pain, orthopnea, edema.  GASTROINTESTINAL: No nausea, vomiting, diarrhea or abdominal pain.  GENITOURINARY: No dysuria, hematuria.  ENDOCRINE: Has polyuria, nocturia,  HEMATOLOGY: No anemia, easy bruising or bleeding SKIN: No rash or lesion. MUSCULOSKELETAL: No joint pain or arthritis.   NEUROLOGIC: No tingling, numbness, weakness.  PSYCHIATRY: No anxiety or depression.   MEDICATIONS AT HOME:  Prior to Admission medications  Medication Sig Start Date End Date Taking? Authorizing Provider  clindamycin (CLEOCIN) 300 MG capsule Take 1 capsule (300 mg total) by mouth every 8 (eight) hours. 04/20/17  Yes Wieting, Richard, MD  insulin aspart (NOVOLOG) 100 UNIT/ML injection Give 5 units subcutaneously before meals if sugar 120- 250;  Give 8 units if sugar 251- 350; give 10 units if sugar greater than 351 04/21/17  Yes Wieting, Richard, MD  insulin aspart protamine- aspart (NOVOLOG MIX 70/30) (70-30) 100 UNIT/ML  injection 42 units subcutaneous injection with breakfast, 34 units subcutaneous injection with dinner 04/21/17  Yes Wieting, Richard, MD  nicotine (NICODERM CQ - DOSED IN MG/24 HOURS) 21 mg/24hr patch Place 1 patch (21 mg total) onto the skin daily. 04/21/17  Yes Wieting, Richard, MD  polyethylene glycol (MIRALAX / GLYCOLAX) packet Take 17 g by mouth daily as needed for moderate constipation. Patient not taking: Reported on 06/04/2017 04/21/17   Alford Highland, MD      PHYSICAL EXAMINATION:   VITAL SIGNS: Blood pressure 117/66, pulse 73, temperature 98.7 F (37.1 C), temperature source Oral, resp. rate 20, height 5\' 4"  (1.626 m), weight 54.4 kg (120 lb), SpO2 98 %.  GENERAL:  26 y.o.-year-old patient lying in the bed with no acute distress.  EYES: Pupils equal, round, reactive to light and accommodation. No scleral icterus. Extraocular muscles intact.  HEENT: Head atraumatic, normocephalic. Oropharynx dry and nasopharynx clear.  NECK:  Supple, no jugular venous distention. No thyroid enlargement, no tenderness.  LUNGS: Normal breath sounds bilaterally, no wheezing, rales,rhonchi or crepitation. No use of accessory muscles of respiration.  CARDIOVASCULAR: S1, S2 normal. No murmurs, rubs, or gallops.  ABDOMEN: Soft, nontender, nondistended. Bowel sounds present. No organomegaly or mass.  EXTREMITIES: No pedal edema, cyanosis, or clubbing.  NEUROLOGIC: Cranial nerves II through XII are intact. Muscle strength 5/5 in all extremities. Sensation intact. Gait not checked.  PSYCHIATRIC: The patient is alert and oriented x 3.  SKIN: No obvious rash, lesion, or ulcer.   LABORATORY PANEL:   CBC Recent Labs  Lab 06/03/17 2202  WBC 6.9  HGB 13.3  HCT 42.3  PLT 332  MCV 95.5  MCH 30.0  MCHC 31.4*  RDW 13.9   ------------------------------------------------------------------------------------------------------------------  Chemistries  Recent Labs  Lab 06/03/17 2202  NA 127*  K 5.2*   CL 85*  CO2 20*  GLUCOSE 621*  BUN 33*  CREATININE 1.47*  CALCIUM 10.0   ------------------------------------------------------------------------------------------------------------------ estimated creatinine clearance is 59.6 mL/min (A) (by C-G formula based on SCr of 1.47 mg/dL (H)). ------------------------------------------------------------------------------------------------------------------ No results for input(s): TSH, T4TOTAL, T3FREE, THYROIDAB in the last 72 hours.  Invalid input(s): FREET3   Coagulation profile No results for input(s): INR, PROTIME in the last 168 hours. ------------------------------------------------------------------------------------------------------------------- No results for input(s): DDIMER in the last 72 hours. -------------------------------------------------------------------------------------------------------------------  Cardiac Enzymes No results for input(s): CKMB, TROPONINI, MYOGLOBIN in the last 168 hours.  Invalid input(s): CK ------------------------------------------------------------------------------------------------------------------ Invalid input(s): POCBNP  ---------------------------------------------------------------------------------------------------------------  Urinalysis    Component Value Date/Time   COLORURINE STRAW (A) 06/03/2017 2202   APPEARANCEUR CLEAR (A) 06/03/2017 2202   APPEARANCEUR Clear 04/06/2014 0751   LABSPEC 1.024 06/03/2017 2202   LABSPEC 1.026 04/06/2014 0751   PHURINE 6.0 06/03/2017 2202   GLUCOSEU >=500 (A) 06/03/2017 2202   GLUCOSEU >=500 04/06/2014 0751   HGBUR NEGATIVE 06/03/2017 2202   BILIRUBINUR NEGATIVE 06/03/2017 2202   BILIRUBINUR Negative 04/06/2014 0751   KETONESUR 20 (A) 06/03/2017 2202   PROTEINUR NEGATIVE 06/03/2017 2202   NITRITE NEGATIVE 06/03/2017 2202  LEUKOCYTESUR NEGATIVE 06/03/2017 2202   LEUKOCYTESUR Negative 04/06/2014 0751     RADIOLOGY: No results  found.  EKG: Orders placed or performed during the hospital encounter of 10/13/16  . ED EKG  . ED EKG    IMPRESSION AND PLAN: 26 year old male patient with history of insulin-dependent diabetes mellitus, hypertension, hepatitis C presented to the emergency room with elevated blood sugar.  Admitting diagnosis 1. Diabetic ketoacidosis 2. Hyponatremia 3. Dehydration 4. Hyperkalemia 5. Hypertension Treatment plan Admit Patient to stepdown unit IV fluid hydration  IV insulin drip Follow-up electrolytes Keep patient nothing by mouth Follow-up anion gap  All the records are reviewed and case discussed with ED provider. Management plans discussed with the patient, family and they are in agreement.  CODE STATUS:FULL CODE Code Status History    Date Active Date Inactive Code Status Order ID Comments User Context   04/17/2017 10:55 04/21/2017 15:59 Full Code [id number]  Ramonita LabGouru, Aruna, MD Inpatient   02/03/2017 13:36 02/04/2017 21:28 Full Code [id number]  Gracelyn NurseJohnston, John D, MD ED   10/13/2016 05:26 10/15/2016 18:12 Full Code [id number]  Arnaldo Nataliamond, Michael S, MD Inpatient   09/08/2016 03:52 09/08/2016 18:37 Full Code [id number]  Tonye RoyaltyHugelmeyer, Alexis, DO Inpatient   08/12/2016 22:20 08/13/2016 17:10 Full Code [id number]  Katharina CaperVaickute, Rima, MD Inpatient   07/04/2016 12:24 07/05/2016 19:36 Full Code [id number]  Altamese DillingVachhani, Vaibhavkumar, MD Inpatient   07/04/2016 12:24 07/04/2016 12:24 Full Code [id number]  Altamese DillingVachhani, Vaibhavkumar, MD Inpatient   04/09/2016 16:54 04/11/2016 14:09 Full Code [id number]  Katharina CaperVaickute, Rima, MD Inpatient   03/26/2016 01:18 03/27/2016 19:11 Full Code [id number]  Tonye RoyaltyHugelmeyer, Alexis, DO Inpatient   03/18/2016 08:15 03/21/2016 20:35 Full Code [id number]  Wyatt HasteHower, David K, MD ED   03/12/2016 12:20 03/16/2016 19:27 Full Code [id number]  Enedina FinnerPatel, Sona, MD Inpatient   02/18/2016 16:59 02/19/2016 17:18 Full Code [id number]  Ramonita LabGouru, Aruna, MD ED   01/12/2016 08:58 01/15/2016 19:33 Full Code [id number]  Milagros LollSudini, Srikar, MD ED    01/11/2016 00:35 01/11/2016 23:20 Full Code [id number]  Gery Prayrosley, Debby, MD Inpatient   12/12/2015 22:04 12/14/2015 18:38 Full Code [id number]  Katharina CaperVaickute, Rima, MD Inpatient   10/23/2015 17:42 10/25/2015 19:04 Full Code [id number]  Katha HammingKonidena, Snehalatha, MD ED   06/10/2015 08:20 06/12/2015 14:41 Full Code [id number]  Arnaldo Nataliamond, Michael S, MD Inpatient   06/06/2015 20:43 06/07/2015 16:20 Full Code [id number]  Enedina FinnerPatel, Sona, MD Inpatient   04/07/2015 12:45 04/08/2015 20:37 Full Code [id number]  Altamese DillingVachhani, Vaibhavkumar, MD Inpatient   12/09/2014 14:44 12/13/2014 16:16 Full Code [id number]  Gale JourneyWalsh, Catherine P, MD Inpatient       TOTAL CRITICAL CARE TIME TAKING CARE OF THIS PATIENT: 55 minutes.    Ihor AustinPavan Pyreddy M.D on 06/04/2017 at 2:41 AM  Between 7am to 6pm - Pager - [phone]  After 6pm go to [email or web address] - password [email or web address]  [facility]  Office  [phone]  CC: Primary care physician; Patient, No Pcp Per

## 2017-06-04 NOTE — Care Management (Signed)
Patient states that he is living with a friend. He states he never followed up with DSS case worker to re-apply for Medicaid or did he ever go to [facility]. He has the ability to ambulate and walks or uses bus wherever he desires.  His teeth are still in poor condition.  I have again provide patient with dental clinics and [place] resources.  No other RNCM needs as patient will not comply.

## 2017-06-04 NOTE — Consult Note (Signed)
Name: Trevor Darlingony B Brown MRN: [id number] DOB: 02-18-1991    ADMISSION DATE:  06/03/2017  CONSULTATION DATE:  06/04/17  REFERRING MD :  Dr. Tobi BastosPyreddy  CHIEF COMPLAINT:  Hyperglycemia  BRIEF PATIENT DESCRIPTION: 26 year old male with DKA.  SIGNIFICANT EVENTS  11/14 >> Patient admitted to the SDU with DKA, on insulin gtt  STUDIES:  none   HISTORY OF PRESENT ILLNESS: Trevor Brown is a 26 year old male with known history of DM, Hep C and Hypertension. Patient takes I/V insulin at home but apparently ran out of his medication.  Patient presented to ED with blood glucuse of 621mg /dl and increased AG.  Therefore patient was started on insulin gtt and was sent to the SDU for closer monitoring.  Patient denies any nausea/vomitting or chest pain.  PAST MEDICAL HISTORY :   has a past medical history of Diabetes mellitus without complication (HCC), Heart attack (HCC), Hepatitis C, acute (may 2016), and Hypertension.  has a past surgical history that includes none. Prior to Admission medications   Medication Sig Start Date End Date Taking? Authorizing Provider  clindamycin (CLEOCIN) 300 MG capsule Take 1 capsule (300 mg total) by mouth every 8 (eight) hours. 04/20/17  Yes Wieting, Richard, MD  insulin aspart (NOVOLOG) 100 UNIT/ML injection Give 5 units subcutaneously before meals if sugar 120- 250;  Give 8 units if sugar 251- 350; give 10 units if sugar greater than 351 04/21/17  Yes Wieting, Richard, MD  insulin aspart protamine- aspart (NOVOLOG MIX 70/30) (70-30) 100 UNIT/ML injection 42 units subcutaneous injection with breakfast, 34 units subcutaneous injection with dinner 04/21/17  Yes Wieting, Richard, MD  nicotine (NICODERM CQ - DOSED IN MG/24 HOURS) 21 mg/24hr patch Place 1 patch (21 mg total) onto the skin daily. 04/21/17  Yes Wieting, Richard, MD  polyethylene glycol (MIRALAX / GLYCOLAX) packet Take 17 g by mouth daily as needed for moderate constipation. Patient not taking: Reported on 06/04/2017  04/21/17   Alford HighlandWieting, Richard, MD   Allergies  Allergen Reactions  . Bee Venom Anaphylaxis  . Penicillins Anaphylaxis, Hives and Other (See Comments)    Has patient had a PCN reaction causing immediate rash, facial/tongue/throat swelling, SOB or lightheadedness with hypotension: Yes Has patient had a PCN reaction causing severe rash involving mucus membranes or skin necrosis: No Has patient had a PCN reaction that required hospitalization No Has patient had a PCN reaction occurring within the last 10 years: Yes If all of the above answers are "NO", then may proceed with Cephalosporin use.  . Ibuprofen Other (See Comments)    Other reaction(s): Other (See Comments) Stomach upset Reaction:  GI upset   . Tramadol Hives and Swelling  . Vancomycin Rash and Other (See Comments)    Reaction:  Red man's syndrome     FAMILY HISTORY:  family history includes Cirrhosis in his mother; Diabetes Mellitus II in his maternal grandfather and maternal grandmother. SOCIAL HISTORY:  reports that he has been smoking cigarettes.  He has been smoking about 2.00 packs per day. he has never used smokeless tobacco. He reports that he uses drugs. Drug: IV. He reports that he does not drink alcohol.   SUBJECTIVE: Patient states" that he feels tired "  VITAL SIGNS: Temp:  [98.7 F (37.1 C)] 98.7 F (37.1 C) (11/13 2157) Pulse Rate:  [66-84] 73 (11/14 0100) Resp:  [18-21] 20 (11/14 0100) BP: (114-136)/(66-77) 117/66 (11/14 0100) SpO2:  [96 %-100 %] 98 % (11/14 0100) Weight:  [120 lb (54.4  kg)] 120 lb (54.4 kg) (11/13 2159)  PHYSICAL EXAMINATION: General:  26 year old male, in no acute distress Neuro:  Awake, alert and oriented HEENT:  AT,[place],No jvd Cardiovascular:  S1s2,regular,no m/r/g Lungs:  Clear bilaterally, no wheezes,crackles and rhonchi  Abdomen: Soft,NT,ND Musculoskeletal:  No edema,cyanosis Skin:  Warm,dry and intact  Recent Labs  Lab 06/03/17 2202  NA 127*  K 5.2*  CL 85*  CO2 20*    BUN 33*  CREATININE 1.47*  GLUCOSE 621*   Recent Labs  Lab 06/03/17 2202  HGB 13.3  HCT 42.3  WBC 6.9  PLT 332   No results found.  ASSESSMENT / PLAN:  Diabetic Ketoacidosis AGMA Hyponatremia Acute Kidney injury related to dehydration   Plan Aggressive hydration with I/V fluids Continue Insulin gtt Follow BMP q4 hours Will start d51/2 when blood glucose 250mg /dl Will transition when AV<40AG<12 and CO2>20  Bincy Varughese,AG-ACNP Pulmonary & Critical Care [facility]   06/04/2017, 2:50 AM   PCCM ATTENDING ATTESTATION:  I have evaluated patient with the APP Varughese, reviewed database in its entirety and discussed care plan in detail. In addition, this patient was discussed on multidisciplinary rounds.   Important exam findings: NAD Very poor dentition Chest clear RRR NABS, thin No edema  Major problems addressed by PCCM team: DKA -now resolved Medical noncompliance Indigent Severe dental caries  PLAN/REC: Transition to Lantus plus SSI Care management consultation He should have all of his teeth extracted due to severe dental caries Transfer to MedSurg  After transfer, PCCM will sign off. Please call if we can be of further assistance   Billy Fischeravid Nicollette Wilhelmi, MD PCCM service Mobile [phone] Pager [phone] 06/04/2017 12:47 PM

## 2017-06-04 NOTE — Clinical Social Work Note (Signed)
CSW consulted to assist with transportation to pick up medication. Patient has been given resources for public transportation multiple times. Please re consult CSW if needed. York SpanielMonica Ancil Dewan MSW,LCSW [phone]

## 2017-06-04 NOTE — Progress Notes (Signed)
Paged [name]. Patient states that Dr. Esaw GrandchildVachanni was going to order him pain medication? Patient states that he is having 7 out of 10 pain to bilateral legs and abdomen.

## 2017-06-04 NOTE — Progress Notes (Signed)
Inpatient Diabetes Program Recommendations  AACE/ADA: New Consensus Statement on Inpatient Glycemic Control (2015)  Target Ranges:  Prepandial:   less than 140 mg/dL      Peak postprandial:   less than 180 mg/dL (1-2 hours)      Critically ill patients:  140 - 180 mg/dL  Results for Trevor Brown, Trevor Brown (MRN [id number]) as of 06/04/2017 11:35  Ref. Range 06/04/2017 05:04 06/04/2017 06:07 06/04/2017 06:56 06/04/2017 08:01 06/04/2017 09:11 06/04/2017 09:57 06/04/2017 11:04  Glucose-Capillary Latest Ref Range: 65 - 99 mg/dL 528180 (H) 413116 (H) 244125 (H) 122 (H) 176 (H) 264 (H) 325 (H)   Results for Trevor Brown, Trevor Brown (MRN [id number]) as of 06/04/2017 11:35  Ref. Range 06/03/2017 21:58 06/04/2017 00:35 06/04/2017 01:24 06/04/2017 02:30 06/04/2017 02:50 06/04/2017 04:01  Glucose-Capillary Latest Ref Range: 65 - 99 mg/dL >644>600 (HH) >034>600 (HH) >742>600 (HH) 598 (HH) 575 (HH) 303 (H)   Review of Glycemic Control  Diabetes history: DM1 Outpatient Diabetes medications: 70/30 42 units QAM, 70/30 34 units QPm, Novolog 5-10 units with meals (depending on glucose) Current orders for Inpatient glycemic control: Lantus 7 units QD, Novolog 0-15 units TID with meals, Novolog 0-5 units QHS  Inpatient Diabetes Program Recommendations:  Insulin - Basal: Patient was transitioned of IV insulin to SQ and given Lantus 7 units at 10:42 am today and CBG up to 325 mg/dl at 59:5611:04. Please consider discontinuing Lantus order and ordering 70/30 20 units BID (starting with supper).  Thanks, Orlando PennerMarie Mahnoor Mathisen, RN, MSN, CDE Diabetes Coordinator Inpatient Diabetes Program [phone] (Team Pager from 8am to 5pm)

## 2017-06-04 NOTE — ED Notes (Signed)
BS- "HIGH"

## 2017-06-04 NOTE — Progress Notes (Signed)
Spoke with Annabelle Harmanana regarding patients blood sugar levels. Annabelle HarmanDana PA has entered orders.

## 2017-06-04 NOTE — Progress Notes (Signed)
Spoke with Dr Bard HerbertSimmonds regarding patients discomfort/pain due to his teeth. Per Dr Bard HerbertSimmonds he will not order pain medication but will order lidocaine mouth wash. Patient informed.

## 2017-06-04 NOTE — Progress Notes (Signed)
Spoke with Dr Sung AmabileSimonds regarding patients leg pain and request for pain meds. At this point Dr Bard HerbertSimmonds ordered tylenol and will not order any other pain medication. Patient made aware.

## 2017-06-05 LAB — URINALYSIS, COMPLETE (UACMP) WITH MICROSCOPIC
Bacteria, UA: NONE SEEN
Bilirubin Urine: NEGATIVE
Glucose, UA: >=500 mg/dL — AB
Hgb urine dipstick: NEGATIVE
Ketones, ur: 20 mg/dL — AB
Leukocytes, UA: NEGATIVE
Nitrite: NEGATIVE
Protein, ur: NEGATIVE mg/dL
RBC / HPF: NONE SEEN RBC/hpf (ref 0–5)
Specific Gravity, Urine: 1.026 (ref 1.005–1.030)
Squamous Epithelial / LPF: NONE SEEN
pH: 5 (ref 5.0–8.0)

## 2017-06-05 LAB — BASIC METABOLIC PANEL
Anion gap: 22 — ABNORMAL HIGH (ref 5–15)
Anion gap: 14 (ref 5–15)
Anion gap: 11 (ref 5–15)
Anion gap: 16 — ABNORMAL HIGH (ref 5–15)
BUN: 33 mg/dL — ABNORMAL HIGH (ref 6–20)
BUN: 20 mg/dL (ref 6–20)
BUN: 19 mg/dL (ref 6–20)
BUN: 18 mg/dL (ref 6–20)
CO2: 20 mmol/L — ABNORMAL LOW (ref 22–32)
CO2: 18 mmol/L — ABNORMAL LOW (ref 22–32)
CO2: 21 mmol/L — ABNORMAL LOW (ref 22–32)
CO2: 19 mmol/L — ABNORMAL LOW (ref 22–32)
Calcium: 10 mg/dL (ref 8.9–10.3)
Calcium: 8.5 mg/dL — ABNORMAL LOW (ref 8.9–10.3)
Calcium: 9 mg/dL (ref 8.9–10.3)
Calcium: 8.6 mg/dL — ABNORMAL LOW (ref 8.9–10.3)
Chloride: 85 mmol/L — ABNORMAL LOW (ref 101–111)
Chloride: 97 mmol/L — ABNORMAL LOW (ref 101–111)
Chloride: 100 mmol/L — ABNORMAL LOW (ref 101–111)
Chloride: 97 mmol/L — ABNORMAL LOW (ref 101–111)
Creatinine, Ser: 1.47 mg/dL — ABNORMAL HIGH (ref 0.61–1.24)
Creatinine, Ser: 0.85 mg/dL (ref 0.61–1.24)
Creatinine, Ser: 0.58 mg/dL — ABNORMAL LOW (ref 0.61–1.24)
Creatinine, Ser: 0.9 mg/dL (ref 0.61–1.24)
GFR calc Af Amer: >60 mL/min (ref >60)
GFR calc Af Amer: >60 mL/min (ref >60)
GFR calc Af Amer: >60 mL/min (ref >60)
GFR calc Af Amer: >60 mL/min (ref >60)
GFR calc non Af Amer: >60 mL/min (ref >60)
GFR calc non Af Amer: >60 mL/min (ref >60)
GFR calc non Af Amer: >60 mL/min (ref >60)
GFR calc non Af Amer: >60 mL/min (ref >60)
Glucose, Bld: 621 mg/dL (ref 65–99)
Glucose, Bld: 416 mg/dL — ABNORMAL HIGH (ref 65–99)
Glucose, Bld: 126 mg/dL — ABNORMAL HIGH (ref 65–99)
Glucose, Bld: 279 mg/dL — ABNORMAL HIGH (ref 65–99)
Potassium: 5.2 mmol/L — ABNORMAL HIGH (ref 3.5–5.1)
Potassium: 4 mmol/L (ref 3.5–5.1)
Potassium: 4 mmol/L (ref 3.5–5.1)
Potassium: 4.1 mmol/L (ref 3.5–5.1)
Sodium: 127 mmol/L — ABNORMAL LOW (ref 135–145)
Sodium: 129 mmol/L — ABNORMAL LOW (ref 135–145)
Sodium: 132 mmol/L — ABNORMAL LOW (ref 135–145)
Sodium: 132 mmol/L — ABNORMAL LOW (ref 135–145)

## 2017-06-05 LAB — GLUCOSE, CAPILLARY
Glucose-Capillary: 579 mg/dL (ref 65–99)
Glucose-Capillary: 534 mg/dL (ref 65–99)
Glucose-Capillary: 476 mg/dL — ABNORMAL HIGH (ref 65–99)
Glucose-Capillary: 272 mg/dL — ABNORMAL HIGH (ref 65–99)
Glucose-Capillary: 139 mg/dL — ABNORMAL HIGH (ref 65–99)
Glucose-Capillary: 164 mg/dL — ABNORMAL HIGH (ref 65–99)
Glucose-Capillary: 150 mg/dL — ABNORMAL HIGH (ref 65–99)
Glucose-Capillary: 171 mg/dL — ABNORMAL HIGH (ref 65–99)
Glucose-Capillary: 297 mg/dL — ABNORMAL HIGH (ref 65–99)
Glucose-Capillary: 299 mg/dL — ABNORMAL HIGH (ref 65–99)
Glucose-Capillary: 306 mg/dL — ABNORMAL HIGH (ref 65–99)
Glucose-Capillary: 257 mg/dL — ABNORMAL HIGH (ref 65–99)
Glucose-Capillary: 214 mg/dL — ABNORMAL HIGH (ref 65–99)
Glucose-Capillary: 134 mg/dL — ABNORMAL HIGH (ref 65–99)
Glucose-Capillary: 161 mg/dL — ABNORMAL HIGH (ref 65–99)
Glucose-Capillary: 135 mg/dL — ABNORMAL HIGH (ref 65–99)
Glucose-Capillary: 195 mg/dL — ABNORMAL HIGH (ref 65–99)
Glucose-Capillary: 156 mg/dL — ABNORMAL HIGH (ref 65–99)
Glucose-Capillary: 113 mg/dL — ABNORMAL HIGH (ref 65–99)
Glucose-Capillary: 136 mg/dL — ABNORMAL HIGH (ref 65–99)
Glucose-Capillary: 164 mg/dL — ABNORMAL HIGH (ref 65–99)

## 2017-06-05 LAB — CBC
HCT: 39 % — ABNORMAL LOW (ref 40.0–52.0)
Hemoglobin: 12.9 g/dL — ABNORMAL LOW (ref 13.0–18.0)
MCH: 29.3 pg (ref 26.0–34.0)
MCHC: 33 g/dL (ref 32.0–36.0)
MCV: 88.9 fL (ref 80.0–100.0)
Platelets: 335 10*3/uL (ref 150–440)
RBC: 4.38 MIL/uL — ABNORMAL LOW (ref 4.40–5.90)
RDW: 13.6 % (ref 11.5–14.5)
WBC: 9.7 10*3/uL (ref 3.8–10.6)

## 2017-06-05 MED ORDER — HYDROCODONE-ACETAMINOPHEN 5-325 MG PO TABS
1.0000 | ORAL_TABLET | Freq: Four times a day (QID) | ORAL | Status: DC | PRN
  Administered 2017-06-05: 1 via ORAL
  Filled 2017-06-05: qty 1

## 2017-06-05 MED ORDER — SODIUM CHLORIDE 0.9 % IV SOLN
INTRAVENOUS | Status: DC
  Administered 2017-06-05: 05:00:00 via INTRAVENOUS

## 2017-06-05 MED ORDER — ADULT MULTIVITAMIN W/MINERALS CH
1.0000 | ORAL_TABLET | Freq: Every day | ORAL | Status: DC
  Administered 2017-06-07: 1 via ORAL
  Filled 2017-06-05: qty 1

## 2017-06-05 MED ORDER — DEXTROSE-NACL 5-0.45 % IV SOLN
INTRAVENOUS | Status: DC
  Administered 2017-06-05: 09:00:00 via INTRAVENOUS

## 2017-06-05 MED ORDER — DEXTROSE-NACL 5-0.45 % IV SOLN
INTRAVENOUS | Status: DC
  Administered 2017-06-06: 02:00:00 via INTRAVENOUS

## 2017-06-05 MED ORDER — POTASSIUM CHLORIDE 10 MEQ/100ML IV SOLN
10.0000 meq | INTRAVENOUS | Status: DC
  Filled 2017-06-05 (×2): qty 100

## 2017-06-05 MED ORDER — SODIUM CHLORIDE 0.9 % IV SOLN
INTRAVENOUS | Status: DC
  Administered 2017-06-05: 4.7 [IU]/h via INTRAVENOUS
  Administered 2017-06-05: 5.4 [IU]/h via INTRAVENOUS
  Filled 2017-06-05: qty 1

## 2017-06-05 MED ORDER — PREMIER PROTEIN SHAKE
11.0000 [oz_av] | Freq: Two times a day (BID) | ORAL | Status: DC
  Administered 2017-06-05 – 2017-06-07 (×4): 11 [oz_av] via ORAL

## 2017-06-05 MED ORDER — ENOXAPARIN SODIUM 40 MG/0.4ML ~~LOC~~ SOLN
40.0000 mg | SUBCUTANEOUS | Status: DC
  Administered 2017-06-06: 40 mg via SUBCUTANEOUS
  Filled 2017-06-05: qty 0.4

## 2017-06-05 MED ORDER — KETOROLAC TROMETHAMINE 15 MG/ML IJ SOLN
15.0000 mg | Freq: Four times a day (QID) | INTRAMUSCULAR | Status: DC | PRN
  Administered 2017-06-05 – 2017-06-07 (×5): 15 mg via INTRAVENOUS
  Filled 2017-06-05 (×7): qty 1

## 2017-06-05 MED ORDER — SODIUM CHLORIDE 0.9 % IV SOLN
INTRAVENOUS | Status: DC
  Administered 2017-06-05: 06:00:00 via INTRAVENOUS

## 2017-06-05 NOTE — Progress Notes (Signed)
[facility] [place] [facility]   PATIENT NAME: Trevor Brown    MR#:  [id number]  DATE OF BIRTH:  [birth year]  SUBJECTIVE:  CHIEF COMPLAINT:   Chief Complaint  Patient presents with  . Hyperglycemia     Came with DKA, as he ran out of his insulin. After insulin drip, blood sugar is under control now after insulin drip, d./ced and switched to his basal insuline.    REVIEW OF SYSTEMS:  CONSTITUTIONAL: No fever, fatigue or weakness.  EYES: No blurred or double vision.  EARS, NOSE, AND THROAT: No tinnitus or ear pain.  RESPIRATORY: No cough, shortness of breath, wheezing or hemoptysis.  CARDIOVASCULAR: No chest pain, orthopnea, edema.  GASTROINTESTINAL: No nausea, vomiting, diarrhea or abdominal pain.  GENITOURINARY: No dysuria, hematuria.  ENDOCRINE: No polyuria, nocturia,  HEMATOLOGY: No anemia, easy bruising or bleeding SKIN: No rash or lesion. MUSCULOSKELETAL: No joint pain or arthritis.   NEUROLOGIC: No tingling, numbness, weakness.  PSYCHIATRY: No anxiety or depression.   ROS  DRUG ALLERGIES:   Allergies  Allergen Reactions  . Bee Venom Anaphylaxis  . Penicillins Anaphylaxis, Hives and Other (See Comments)    Has patient had a PCN reaction causing immediate rash, facial/tongue/throat swelling, SOB or lightheadedness with hypotension: Yes Has patient had a PCN reaction causing severe rash involving mucus membranes or skin necrosis: No Has patient had a PCN reaction that required hospitalization No Has patient had a PCN reaction occurring within the last 10 years: Yes If all of the above answers are "NO", then may proceed with Cephalosporin use.  . Ibuprofen Other (See Comments)    Other reaction(s): Other (See Comments) Stomach upset Reaction:  GI upset   . Tramadol Hives and Swelling  . Vancomycin Rash and Other (See Comments)    Reaction:  Red man's syndrome     VITALS:  Blood pressure 130/83, pulse 64, temperature 98.3 F (36.8 C), resp.  rate (!) 24, height 5\' 6"  (1.676 m), weight 53 kg (116 lb 14.4 oz), SpO2 98 %.  PHYSICAL EXAMINATION:  GENERAL:  26 y.o.-year-old patient lying in the bed with no acute distress.  EYES: Pupils equal, round, reactive to light and accommodation. No scleral icterus. Extraocular muscles intact.  HEENT: Head atraumatic, normocephalic. Oropharynx and nasopharynx clear. Poor oral hyegiene. NECK:  Supple, no jugular venous distention. No thyroid enlargement, no tenderness.  LUNGS: Normal breath sounds bilaterally, no wheezing, rales,rhonchi or crepitation. No use of accessory muscles of respiration.  CARDIOVASCULAR: S1, S2 normal. No murmurs, rubs, or gallops.  ABDOMEN: Soft, nontender, nondistended. Bowel sounds present. No organomegaly or mass.  EXTREMITIES: No pedal edema, cyanosis, or clubbing.  NEUROLOGIC: Cranial nerves II through XII are intact. Muscle strength 5/5 in all extremities. Sensation intact. Gait not checked.  PSYCHIATRIC: The patient is alert and oriented x 3.  SKIN: No obvious rash, lesion, or ulcer.   Physical Exam LABORATORY PANEL:   CBC Recent Labs  Lab 06/05/17 0613  WBC 9.7  HGB 12.9*  HCT 39.0*  PLT 335   ------------------------------------------------------------------------------------------------------------------  Chemistries  Recent Labs  Lab 06/05/17 0613  NA 129*  K 4.0  CL 97*  CO2 18*  GLUCOSE 416*  BUN 20  CREATININE 0.85  CALCIUM 8.5*   ------------------------------------------------------------------------------------------------------------------  Cardiac Enzymes No results for input(s): TROPONINI in the last 168 hours. ------------------------------------------------------------------------------------------------------------------  RADIOLOGY:  No results found.  ASSESSMENT AND PLAN:   Active Problems:   DKA (diabetic ketoacidoses) (HCC)  * DKA   Responded  to IV insuline, start on home dose insuline.   He ran out of insuline,  will need prescription for insuline on d.c.  * hyponatremia   Hyperkalemia   Dehydration     Due to DKA, getting better with treatment.   All the records are reviewed and case discussed with Care Management/Social Workerr. Management plans discussed with the patient, family and they are in agreement.  CODE STATUS: full  TOTAL TIME TAKING CARE OF THIS PATIENT: 35 minutes.    POSSIBLE D/C IN 1-2 DAYS, DEPENDING ON CLINICAL CONDITION.   Altamese DillingVACHHANI, [name] M.D on 06/05/2017   Between 7am to 6pm - Pager - [phone]  After 6pm go to [email or web address] - password [email or web address]  [facility] [place] [facility]  Office  [phone]  CC: Primary care physician; Patient, No Pcp Per  Note: This dictation was prepared with Dragon dictation along with smaller phrase technology. Any transcriptional errors that result from this process are unintentional.

## 2017-06-05 NOTE — Progress Notes (Signed)
[facility] - [place] [facility]   PATIENT NAME: Trevor Brown    MR#:  [id number]  DATE OF BIRTH:  [birth year]  SUBJECTIVE:  CHIEF COMPLAINT:   Chief Complaint  Patient presents with  . Hyperglycemia     Came with DKA, as he ran out of his insulin. After insulin drip, blood sugar is under control  after insulin drip, d/ced and switched to his basal insuline.   Again overnight went in DKA, so started Insuline drip. Have c/o generalized pain, also have " kidney" pain. REVIEW OF SYSTEMS:  CONSTITUTIONAL: No fever, fatigue or weakness.  EYES: No blurred or double vision.  EARS, NOSE, AND THROAT: No tinnitus or ear pain.  RESPIRATORY: No cough, shortness of breath, wheezing or hemoptysis.  CARDIOVASCULAR: No chest pain, orthopnea, edema.  GASTROINTESTINAL: No nausea, vomiting, diarrhea or abdominal pain.  GENITOURINARY: No dysuria, hematuria.  ENDOCRINE: No polyuria, nocturia,  HEMATOLOGY: No anemia, easy bruising or bleeding SKIN: No rash or lesion. MUSCULOSKELETAL: No joint pain or arthritis.   NEUROLOGIC: No tingling, numbness, weakness.  PSYCHIATRY: No anxiety or depression.   ROS  DRUG ALLERGIES:   Allergies  Allergen Reactions  . Bee Venom Anaphylaxis  . Penicillins Anaphylaxis, Hives and Other (See Comments)    Has patient had a PCN reaction causing immediate rash, facial/tongue/throat swelling, SOB or lightheadedness with hypotension: Yes Has patient had a PCN reaction causing severe rash involving mucus membranes or skin necrosis: No Has patient had a PCN reaction that required hospitalization No Has patient had a PCN reaction occurring within the last 10 years: Yes If all of the above answers are "NO", then may proceed with Cephalosporin use.  . Ibuprofen Other (See Comments)    Other reaction(s): Other (See Comments) Stomach upset Reaction:  GI upset   . Tramadol Hives and Swelling  . Vancomycin Rash and Other (See Comments)    Reaction:  Red  man's syndrome     VITALS:  Blood pressure 128/80, pulse 81, temperature 98.7 F (37.1 C), resp. rate 19, height 5\' 6"  (1.676 m), weight 53 kg (116 lb 14.4 oz), SpO2 99 %.  PHYSICAL EXAMINATION:  GENERAL:  26 y.o.-year-old patient lying in the bed with no acute distress.  EYES: Pupils equal, round, reactive to light and accommodation. No scleral icterus. Extraocular muscles intact.  HEENT: Head atraumatic, normocephalic. Oropharynx and nasopharynx clear. Poor oral hyegiene. NECK:  Supple, no jugular venous distention. No thyroid enlargement, no tenderness.  LUNGS: Normal breath sounds bilaterally, no wheezing, rales,rhonchi or crepitation. No use of accessory muscles of respiration.  CARDIOVASCULAR: S1, S2 normal. No murmurs, rubs, or gallops.  ABDOMEN: Soft, nontender, nondistended. Bowel sounds present. No organomegaly or mass.  EXTREMITIES: No pedal edema, cyanosis, or clubbing.  NEUROLOGIC: Cranial nerves II through XII are intact. Muscle strength 5/5 in all extremities. Sensation intact. Gait not checked.  PSYCHIATRIC: The patient is alert and oriented x 3.  SKIN: No obvious rash, lesion, or ulcer.   Physical Exam LABORATORY PANEL:   CBC Recent Labs  Lab 06/05/17 0613  WBC 9.7  HGB 12.9*  HCT 39.0*  PLT 335   ------------------------------------------------------------------------------------------------------------------  Chemistries  Recent Labs  Lab 06/05/17 1305  NA 132*  K 4.1  CL 97*  CO2 19*  GLUCOSE 279*  BUN 18  CREATININE 0.90  CALCIUM 8.6*   ------------------------------------------------------------------------------------------------------------------  Cardiac Enzymes No results for input(s): TROPONINI in the last 168 hours. ------------------------------------------------------------------------------------------------------------------  RADIOLOGY:  No results found.  ASSESSMENT AND PLAN:  Active Problems:   DKA (diabetic ketoacidoses)  (HCC)  * DKA   Responded to IV insuline, start on home dose insuline ( 06/04/17)  Again had DKA overnight, resume insuline drip.   He ran out of insuline, will need prescription for insuline on d.c.  * hyponatremia   Hyperkalemia   Dehydration     Due to DKA, getting better with treatment.  * pain - genrealized   Also c/o " kidney " pain   Checked and negative UA    Pain seeking behaviour   Avoid opioids.  * Smoking   Councelled to Quit smoking for 4 min.  All the records are reviewed and case discussed with Care Management/Social Workerr. Management plans discussed with the patient, family and they are in agreement.  CODE STATUS: full  TOTAL TIME TAKING CARE OF THIS PATIENT: 35 minutes.    POSSIBLE D/C IN 1-2 DAYS, DEPENDING ON CLINICAL CONDITION.   Altamese DillingVaibhavkumar Anaston Brown M.D on 06/05/2017   Between 7am to 6pm - Pager - [phone]  After 6pm go to [email or web address] - password [email or web address]  [facility] [place] [facility]  Office  [phone]  CC: Primary care physician; Patient, No Pcp Per  Note: This dictation was prepared with Dragon dictation along with smaller phrase technology. Any transcriptional errors that result from this process are unintentional.

## 2017-06-05 NOTE — Progress Notes (Signed)
Returned to unit due to High Blood Glucose.  Placed on Insulin drip with rate dictated by Glucose Stabilizer.  Will continue to monitor.

## 2017-06-05 NOTE — Progress Notes (Signed)
Dr Desiree HaneVachani rounded on patient. HE has deferred pain manegment to ICU docs. Awaiting for evaluation.

## 2017-06-05 NOTE — Progress Notes (Signed)
Patient requested to speak with MD. Patient states that he is having left Kidney pain. Spoke with Annabelle HarmanDana will order UA and she will come to evaluate patient.

## 2017-06-05 NOTE — Progress Notes (Signed)
   Patient bounced back with Blood glucose of 534mg /dl  Patient was started on insulin gtt.  Will continue to monitor.   Jennie Hannay,AG-ACNP Pulmonary & Critical Care

## 2017-06-05 NOTE — Progress Notes (Signed)
Patient requested to speak with Dr Bard HerbertSImmonds regarding pain. Per Dr Jaynie CollinsSimmonds Dana will evaluate patient.

## 2017-06-05 NOTE — Plan of Care (Signed)
  RD consulted for nutrition education regarding diabetes.   Lab Results  Component Value Date   HGBA1C 10.6 (H) 04/18/2017    RD provided "Carbohydrate Counting for People with Diabetes" handout from the Academy of Nutrition and Dietetics. Discussed different food groups and their effects on blood sugar, emphasizing carbohydrate-containing foods. Provided list of carbohydrates and recommended serving sizes of common foods.  RD provided "Using Nutrition Labels: Carbohydrates" handout from the Academy of Nutrition and Dietetics. Discussed how to read a nutrition label including looking at serving size and servings per container. Reviewed that there are 15 grams of carbohydrate in one carbohydrate choice. Encouraged patient to use chart for range of carbohydrate grams per choice when reading nutrition labels.  Discussed importance of controlled and consistent carbohydrate intake throughout the day. Provided examples of ways to balance meals/snacks and encouraged intake of high-fiber, whole grain complex carbohydrates. Teach back method used.  Expect poor compliance. Patient had very poor engagement in education. He reports he has learned how to manage diabetes before it is just difficult to do when he does not know where his next meal is coming from and runs out of his insulin.  Body mass index is 18.87 kg/m. Patient is on the low end of normal weight per current BMI. He is close to being underweight.  Current diet order is Carbohydrate Modified, patient is consuming approximately 100% of meals at this time. Labs and medications reviewed. Patient also being followed for malnutrition. RD contact information provided.   Helane RimaLeanne Kiyani Jernigan, MS, RD, LDN Office: [phone] Pager: [phone] After Hours/Weekend Pager: [phone]

## 2017-06-05 NOTE — Progress Notes (Signed)
Initial Nutrition Assessment  DOCUMENTATION CODES:   Non-severe (moderate) malnutrition in context of social or environmental circumstances  INTERVENTION:  Provide Premier Protein po BID, each supplement provides 160 kcal and 30 grams of protein.  Provide daily multivitamin with minerals.  Attempted education with patient (note to follow) but he had poor acceptance and participation.  NUTRITION DIAGNOSIS:   Moderate Malnutrition related to social / environmental circumstances(inadequate oral intake, food insecurity, ran out of insulin) as evidenced by 11.8% weight loss over 5 months, mild fat depletion, moderate fat depletion, mild muscle depletion.  GOAL:   Patient will meet greater than or equal to 90% of their needs  MONITOR:   PO intake, Supplement acceptance, Labs, Weight trends, I & O's  REASON FOR ASSESSMENT:   Consult(Received verbal consult) Assessment of nutrition requirement/status, Diet education  ASSESSMENT:   26 year old male with PMHx of diabetes mellitus type 1, hepatitis C who was admitted with DKA. Patient is homeless and ran out of his insulin.   -Last night patient transferred out of the unit once he was off the insulin gtt and was on a diet. He transferred back with elevated CBGs shortly after. -As patient is hungry plan is to start him on CHO Mod diet and cover while he is on insulin gtt.  Met with patient at bedside. Patient's mood seemed down. He reports he does not eat very well due to how irregular his meals are. He can go to shelters for meals, but he does not always have good access to meals depending on the day. On days where he cannot get a hot meal he may eat some peanut butter crackers. He reports he has had diabetes for the past 6 years and it is difficult to manage. He reports eating even less when he runs out of his insulin. He goes to [facility] for insulin. Patient reports he is eating well here and tolerating diet. Denies any N/V,  abdominal pain at this time.  Patient reports his UBW is 130 lbs, but it is variable depending on how well he is able to eat. Per chart patient was 132.5 lbs on 12/31/2016. He has lost 15.6 lbs (11.8% body weight) over the past 5 months, which is significant for time frame.  Medications reviewed and include: D5-1/2NS @ 125 mL/hr (150 grams dextrose, 510 kcal daily), insulin gtt.  Labs reviewed: CBG 150-306, Sodium 132, Chloride 97, CO2 19, Anion gap 16.  Discussed with RN.  NUTRITION - FOCUSED PHYSICAL EXAM:    Most Recent Value  Orbital Region  Moderate depletion  Upper Arm Region  Mild depletion  Thoracic and Lumbar Region  Mild depletion  Buccal Region  Moderate depletion  Temple Region  Moderate depletion  Clavicle Bone Region  Mild depletion  Clavicle and Acromion Bone Region  Mild depletion  Scapular Bone Region  No depletion  Dorsal Hand  No depletion  Patellar Region  Mild depletion  Anterior Thigh Region  Mild depletion  Posterior Calf Region  Mild depletion  Edema (RD Assessment)  None  Hair  Reviewed  Eyes  Reviewed  Mouth  Reviewed [poor dentition]  Skin  Reviewed  Nails  Reviewed     Diet Order:  Diet Carb Modified Fluid consistency: Thin; Room service appropriate? Yes  EDUCATION NEEDS:   Education needs have been addressed  Skin:  Skin Assessment: Reviewed RN Assessment(no issues)  Last BM:  06/03/2017  Height:   Ht Readings from Last 1 Encounters:  06/04/17 5'  6" (1.676 m)    Weight:   Wt Readings from Last 1 Encounters:  06/04/17 116 lb 14.4 oz (53 kg)    Ideal Body Weight:  64.5 kg  BMI:  Body mass index is 18.87 kg/m.  Estimated Nutritional Needs:   Kcal:  1760-2050 (MSJ x 1.2-1.4)  Protein:  64-74 (1.2-1.4 grams/kg)  Fluid:  1.6-1.9 L/day (30-35 mL/kg)  Willey Blade, MS, RD, LDN Office: [phone] Pager: [phone] After Hours/Weekend Pager: [phone]

## 2017-06-05 NOTE — Progress Notes (Signed)
eLink Physician-Brief Progress Note Patient Name: Trevor Brown DOB: 05-Dec-1990 MRN: [id number]   Date of Service  06/05/2017  HPI/Events of Note  DKA  eICU Interventions  Insulin infusion-wean off per protocol     Intervention Category Evaluation Type: New Patient Evaluation  Erin FullingKurian Kitty Cadavid 06/05/2017, 5:52 AM

## 2017-06-05 NOTE — Progress Notes (Signed)
Inpatient Diabetes Program Recommendations  AACE/ADA: New Consensus Statement on Inpatient Glycemic Control (2015)  Target Ranges:  Prepandial:   less than 140 mg/dL      Peak postprandial:   less than 180 mg/dL (1-2 hours)      Critically ill patients:  140 - 180 mg/dL   Lab Results  Component Value Date   GLUCAP 272 (H) 06/05/2017   HGBA1C 10.6 (H) 04/18/2017    Review of Glycemic Control   Results for Trevor Brown, Trevor Brown (MRN [id number]) as of 06/05/2017 08:37  Ref. Range 06/05/2017 02:35 06/05/2017 02:36 06/05/2017 04:51 06/05/2017 06:34 06/05/2017 07:30  Glucose-Capillary Latest Ref Range: 65 - 99 mg/dL 578589 (HH) 469579 (HH) 629534 (HH) 476 (H) 272 (H)    Diabetes history: DM1 Outpatient Diabetes medications: 70/30 42 units QAM, 70/30 34 units QPm, Novolog 5-10 units with meals (depending on glucose) Current orders for Inpatient glycemic control: IV insulin-DKA order set   Inpatient Diabetes Program Recommendations:  Based on outcome of the IV insulin earlier this admission, consider keeping IV insulin on until DKA is cleared and CBG 140-180 for 4-6 hours before transitioning to SQ insulin, then transition to   70/30 insulin 30 units BID.  Susette RacerJulie Webber Michiels, RN, BA, MHA, CDE Diabetes Coordinator Inpatient Diabetes Program  [phone] (Team Pager) [phone] [facility] Office) 06/05/2017 8:41 AM

## 2017-06-05 NOTE — Progress Notes (Signed)
Transferred out of the stepdown unit yesterday.  Returned this early morning with severe hyperglycemia and recurrent (mild) DKA due to dietary indiscretion.  Has multiple complaints of pain -mouth, "kidney", etc.  Vitals:   06/05/17 1000 06/05/17 1100 06/05/17 1200 06/05/17 1300  BP: 116/67 135/88 (!) 140/91 128/80  Pulse: 65 71 93 81  Resp: (!) 22 (!) 27 (!) 22 19  Temp:    98.7 F (37.1 C)  TempSrc:      SpO2: 96% 96% (!) 86% 99%  Weight:      Height:       NAD HEENT: Very poor dentition, otherwise NSC No JVD No wheezes Regular, no M Abdomen soft, NABS Extremities warm, no edema No focal neurologic deficits  BMP Latest Ref Rng & Units 06/05/2017 06/05/2017 06/05/2017  Glucose 65 - 99 mg/dL 962(X279(H) 528(U126(H) 132(G416(H)  BUN 6 - 20 mg/dL 18 19 20   Creatinine 0.61 - 1.24 mg/dL 4.010.90 0.27(O0.58(L) 5.360.85  BUN/Creat Ratio 9 - 20 - - -  Sodium 135 - 145 mmol/L 132(L) 132(L) 129(L)  Potassium 3.5 - 5.1 mmol/L 4.1 4.0 4.0  Chloride 101 - 111 mmol/L 97(L) 100(L) 97(L)  CO2 22 - 32 mmol/L 19(L) 21(L) 18(L)  Calcium 8.9 - 10.3 mg/dL 6.4(Q8.6(L) 9.0 0.3(K8.5(L)    CBC Latest Ref Rng & Units 06/05/2017 06/03/2017 04/18/2017  WBC 3.8 - 10.6 K/uL 9.7 6.9 7.0  Hemoglobin 13.0 - 18.0 g/dL 12.9(L) 13.3 12.4(L)  Hematocrit 40.0 - 52.0 % 39.0(L) 42.3 36.0(L)  Platelets 150 - 440 K/uL 335 332 211    No new chest x-ray  IMPRESSION: DKA, recurrent Medical noncompliance Indigent Multiple complaints of pain Severe dental caries  PLAN/REC: Continue DKA protocol and transition back to Lantus plus SSI once anion gap has closed Care management is involved to try to establish proper care as outpatient Analgesics as needed I counseled him regarding the importance of complying with dietary recommendations  Billy Fischeravid Milagros Middendorf, MD PCCM service Mobile [phone] Pager [phone] 06/05/2017 2:50 PM

## 2017-06-06 ENCOUNTER — Encounter: Payer: Self-pay | Admitting: Emergency Medicine

## 2017-06-06 LAB — BASIC METABOLIC PANEL
Anion gap: 11 (ref 5–15)
Anion gap: 11 (ref 5–15)
Anion gap: 11 (ref 5–15)
Anion gap: 9 (ref 5–15)
Anion gap: 9 (ref 5–15)
BUN: 16 mg/dL (ref 6–20)
BUN: 17 mg/dL (ref 6–20)
BUN: 19 mg/dL (ref 6–20)
BUN: 20 mg/dL (ref 6–20)
BUN: 21 mg/dL — ABNORMAL HIGH (ref 6–20)
CO2: 20 mmol/L — ABNORMAL LOW (ref 22–32)
CO2: 20 mmol/L — ABNORMAL LOW (ref 22–32)
CO2: 20 mmol/L — ABNORMAL LOW (ref 22–32)
CO2: 22 mmol/L (ref 22–32)
CO2: 23 mmol/L (ref 22–32)
Calcium: 8.3 mg/dL — ABNORMAL LOW (ref 8.9–10.3)
Calcium: 8.4 mg/dL — ABNORMAL LOW (ref 8.9–10.3)
Calcium: 8.5 mg/dL — ABNORMAL LOW (ref 8.9–10.3)
Calcium: 8.6 mg/dL — ABNORMAL LOW (ref 8.9–10.3)
Calcium: 8.9 mg/dL (ref 8.9–10.3)
Chloride: 101 mmol/L (ref 101–111)
Chloride: 101 mmol/L (ref 101–111)
Chloride: 102 mmol/L (ref 101–111)
Chloride: 103 mmol/L (ref 101–111)
Chloride: 98 mmol/L — ABNORMAL LOW (ref 101–111)
Creatinine, Ser: 0.67 mg/dL (ref 0.61–1.24)
Creatinine, Ser: 0.73 mg/dL (ref 0.61–1.24)
Creatinine, Ser: 0.74 mg/dL (ref 0.61–1.24)
Creatinine, Ser: 0.76 mg/dL (ref 0.61–1.24)
Creatinine, Ser: 0.77 mg/dL (ref 0.61–1.24)
GFR calc Af Amer: >60 mL/min (ref >60)
GFR calc Af Amer: >60 mL/min (ref >60)
GFR calc Af Amer: >60 mL/min (ref >60)
GFR calc Af Amer: >60 mL/min (ref >60)
GFR calc Af Amer: >60 mL/min (ref >60)
GFR calc non Af Amer: >60 mL/min (ref >60)
GFR calc non Af Amer: >60 mL/min (ref >60)
GFR calc non Af Amer: >60 mL/min (ref >60)
GFR calc non Af Amer: >60 mL/min (ref >60)
GFR calc non Af Amer: >60 mL/min (ref >60)
Glucose, Bld: 124 mg/dL — ABNORMAL HIGH (ref 65–99)
Glucose, Bld: 155 mg/dL — ABNORMAL HIGH (ref 65–99)
Glucose, Bld: 187 mg/dL — ABNORMAL HIGH (ref 65–99)
Glucose, Bld: 237 mg/dL — ABNORMAL HIGH (ref 65–99)
Glucose, Bld: 397 mg/dL — ABNORMAL HIGH (ref 65–99)
Potassium: 3.3 mmol/L — ABNORMAL LOW (ref 3.5–5.1)
Potassium: 3.6 mmol/L (ref 3.5–5.1)
Potassium: 4.4 mmol/L (ref 3.5–5.1)
Potassium: 4.6 mmol/L (ref 3.5–5.1)
Potassium: 4.9 mmol/L (ref 3.5–5.1)
Sodium: 129 mmol/L — ABNORMAL LOW (ref 135–145)
Sodium: 132 mmol/L — ABNORMAL LOW (ref 135–145)
Sodium: 132 mmol/L — ABNORMAL LOW (ref 135–145)
Sodium: 133 mmol/L — ABNORMAL LOW (ref 135–145)
Sodium: 135 mmol/L (ref 135–145)

## 2017-06-06 LAB — GLUCOSE, CAPILLARY
Glucose-Capillary: 115 mg/dL — ABNORMAL HIGH (ref 65–99)
Glucose-Capillary: 149 mg/dL — ABNORMAL HIGH (ref 65–99)
Glucose-Capillary: 151 mg/dL — ABNORMAL HIGH (ref 65–99)
Glucose-Capillary: 158 mg/dL — ABNORMAL HIGH (ref 65–99)
Glucose-Capillary: 164 mg/dL — ABNORMAL HIGH (ref 65–99)
Glucose-Capillary: 174 mg/dL — ABNORMAL HIGH (ref 65–99)
Glucose-Capillary: 221 mg/dL — ABNORMAL HIGH (ref 65–99)
Glucose-Capillary: 233 mg/dL — ABNORMAL HIGH (ref 65–99)
Glucose-Capillary: 387 mg/dL — ABNORMAL HIGH (ref 65–99)
Glucose-Capillary: 413 mg/dL — ABNORMAL HIGH (ref 65–99)
Glucose-Capillary: 589 mg/dL (ref 65–99)

## 2017-06-06 MED ORDER — INSULIN ASPART PROT & ASPART (70-30 MIX) 100 UNIT/ML ~~LOC~~ SUSP
38.0000 [IU] | Freq: Every day | SUBCUTANEOUS | Status: DC
  Administered 2017-06-07: 38 [IU] via SUBCUTANEOUS
  Filled 2017-06-06: qty 10

## 2017-06-06 MED ORDER — INSULIN ASPART PROT & ASPART (70-30 MIX) 100 UNIT/ML ~~LOC~~ SUSP
32.0000 [IU] | Freq: Every day | SUBCUTANEOUS | Status: DC
  Administered 2017-06-06 – 2017-06-07 (×2): 32 [IU] via SUBCUTANEOUS
  Filled 2017-06-06 (×2): qty 10

## 2017-06-06 MED ORDER — INSULIN ASPART PROT & ASPART (70-30 MIX) 100 UNIT/ML ~~LOC~~ SUSP
25.0000 [IU] | Freq: Two times a day (BID) | SUBCUTANEOUS | Status: DC
  Administered 2017-06-06: 25 [IU] via SUBCUTANEOUS
  Filled 2017-06-06: qty 10

## 2017-06-06 MED ORDER — INSULIN ASPART PROT & ASPART (70-30 MIX) 100 UNIT/ML ~~LOC~~ SUSP: 30.0000 [IU] | Freq: Two times a day (BID) | SUBCUTANEOUS | Status: DC

## 2017-06-06 MED ORDER — INSULIN ASPART 100 UNIT/ML ~~LOC~~ SOLN: 0.0000 [IU] | SUBCUTANEOUS | Status: DC

## 2017-06-06 MED ORDER — POTASSIUM CHLORIDE CRYS ER 20 MEQ PO TBCR
40.0000 meq | EXTENDED_RELEASE_TABLET | Freq: Once | ORAL | Status: AC
  Administered 2017-06-06: 40 meq via ORAL
  Filled 2017-06-06: qty 2

## 2017-06-06 MED ORDER — INSULIN ASPART 100 UNIT/ML ~~LOC~~ SOLN
0.0000 [IU] | Freq: Three times a day (TID) | SUBCUTANEOUS | Status: DC
Start: 2017-06-06 — End: 2017-06-07
  Administered 2017-06-06 – 2017-06-07 (×3): 3 [IU] via SUBCUTANEOUS
  Filled 2017-06-06 (×3): qty 1

## 2017-06-06 MED ORDER — INSULIN GLARGINE 100 UNIT/ML ~~LOC~~ SOLN
7.0000 [IU] | SUBCUTANEOUS | Status: DC
  Administered 2017-06-06: 7 [IU] via SUBCUTANEOUS
  Filled 2017-06-06 (×2): qty 0.07

## 2017-06-06 MED ORDER — INSULIN ASPART 100 UNIT/ML ~~LOC~~ SOLN
0.0000 [IU] | Freq: Three times a day (TID) | SUBCUTANEOUS | Status: DC
  Administered 2017-06-06: 15 [IU] via SUBCUTANEOUS
  Administered 2017-06-06: 5 [IU] via SUBCUTANEOUS
  Filled 2017-06-06 (×2): qty 1

## 2017-06-06 NOTE — Progress Notes (Signed)
Patient transferred to floor @ 0820 patient demanding insulin upon arrival. Patient CBG per lab 413. Notified charge in ICU and MD. Awaiting further orders at this time. Patient a/o and responding with no complaints.

## 2017-06-06 NOTE — Progress Notes (Addendum)
Inpatient Diabetes Program Recommendations  AACE/ADA: New Consensus Statement on Inpatient Glycemic Control (2015)  Target Ranges:  Prepandial:   less than 140 mg/dL      Peak postprandial:   less than 180 mg/dL (1-2 hours)      Critically ill patients:  140 - 180 mg/dL   Lab Results  Component Value Date   GLUCAP 413 (H) 06/06/2017   HGBA1C 10.6 (H) 04/18/2017    Review of Glycemic Control  Results for [name], Lindell B (MRN [id number]) as of 06/06/2017 08:23  Ref. Range 06/06/2017 01:08 06/06/2017 02:11 06/06/2017 03:19 06/06/2017 04:28 06/06/2017 07:42  Glucose-Capillary Latest Ref Range: 65 - 99 mg/dL 409164 (H) 811151 (H) 914158 (H) 149 (H) 413 (H)   Diabetes history:DM1 Outpatient Diabetes medications:70/30 42 units QAM, 70/30 34 units QPm, Novolog 5-10 units with meals (depending on glucose) Current orders for Inpatient glycemic control:Lantus 7 units q24h, Novolog 0-15 units tid   Inpatient Diabetes Program Recommendations:  This gentleman confirms he takes 42 units 70/30 insulin in the am and 35 units 70/30 in the pm. Receive Lantus 7 units at transition off IV insulin yesterday.   Consider starting 70/30 insulin 38units in the am, 32 units 70/30 units qpm.(10% reduction from home)  Consider decreasing correction insulin to sensitive correction 0-9 units tid.   Discussed with Dr. Elisabeth PigeonVachhani.  Susette RacerJulie Lunah Losasso, RN, BA, MHA, CDE Diabetes Coordinator Inpatient Diabetes Program  [phone] (Team Pager) [phone] [facility] Office) 06/06/2017 8:46 AM

## 2017-06-06 NOTE — Progress Notes (Signed)
[employer] [facility] [place] [facility]   PATIENT NAME: Trevor Brown    MR#:  [id number]  DATE OF BIRTH:  September 05, 1990  SUBJECTIVE:  CHIEF COMPLAINT:   Chief Complaint  Patient presents with  . Hyperglycemia     Came with DKA, as he ran out of his insulin. After insulin drip, blood sugar is under control  after insulin drip, d/ced and switched to his basal insuline.   Again overnight went in DKA, so started Insuline drip. Have c/o generalized pain, also have " kidney" pain.   Off insuline drip, but blood sugar was >400 in am. REVIEW OF SYSTEMS:  CONSTITUTIONAL: No fever, fatigue or weakness.  EYES: No blurred or double vision.  EARS, NOSE, AND THROAT: No tinnitus or ear pain.  RESPIRATORY: No cough, shortness of breath, wheezing or hemoptysis.  CARDIOVASCULAR: No chest pain, orthopnea, edema.  GASTROINTESTINAL: No nausea, vomiting, diarrhea or abdominal pain.  GENITOURINARY: No dysuria, hematuria.  ENDOCRINE: No polyuria, nocturia,  HEMATOLOGY: No anemia, easy bruising or bleeding SKIN: No rash or lesion. MUSCULOSKELETAL: No joint pain or arthritis.   NEUROLOGIC: No tingling, numbness, weakness.  PSYCHIATRY: No anxiety or depression.   ROS  DRUG ALLERGIES:   Allergies  Allergen Reactions  . Bee Venom Anaphylaxis  . Penicillins Anaphylaxis, Hives and Other (See Comments)    Has patient had a PCN reaction causing immediate rash, facial/tongue/throat swelling, SOB or lightheadedness with hypotension: Yes Has patient had a PCN reaction causing severe rash involving mucus membranes or skin necrosis: No Has patient had a PCN reaction that required hospitalization No Has patient had a PCN reaction occurring within the last 10 years: Yes If all of the above answers are "NO", then may proceed with Cephalosporin use.  . Ibuprofen Other (See Comments)    Other reaction(s): Other (See Comments) Stomach upset Reaction:  GI upset   . Tramadol Hives and Swelling  .  Vancomycin Rash and Other (See Comments)    Reaction:  Red man's syndrome     VITALS:  Blood pressure 139/80, pulse (!) 56, temperature 98.1 F (36.7 C), temperature source Oral, resp. rate 20, height 5\' 6"  (1.676 m), weight 53 kg (116 lb 14.4 oz), SpO2 98 %.  PHYSICAL EXAMINATION:  GENERAL:  26 y.o.-year-old patient lying in the bed with no acute distress.  EYES: Pupils equal, round, reactive to light and accommodation. No scleral icterus. Extraocular muscles intact.  HEENT: Head atraumatic, normocephalic. Oropharynx and nasopharynx clear. Poor oral hyegiene. NECK:  Supple, no jugular venous distention. No thyroid enlargement, no tenderness.  LUNGS: Normal breath sounds bilaterally, no wheezing, rales,rhonchi or crepitation. No use of accessory muscles of respiration.  CARDIOVASCULAR: S1, S2 normal. No murmurs, rubs, or gallops.  ABDOMEN: Soft, nontender, nondistended. Bowel sounds present. No organomegaly or mass.  EXTREMITIES: No pedal edema, cyanosis, or clubbing.  NEUROLOGIC: Cranial nerves II through XII are intact. Muscle strength 5/5 in all extremities. Sensation intact. Gait not checked.  PSYCHIATRIC: The patient is alert and oriented x 3.  SKIN: No obvious rash, lesion, or ulcer.   Physical Exam LABORATORY PANEL:   CBC Recent Labs  Lab 06/05/17 0613  WBC 9.7  HGB 12.9*  HCT 39.0*  PLT 335   ------------------------------------------------------------------------------------------------------------------  Chemistries  Recent Labs  Lab 06/06/17 1221  NA 133*  K 4.6  CL 102  CO2 20*  GLUCOSE 237*  BUN 19  CREATININE 0.73  CALCIUM 8.9   ------------------------------------------------------------------------------------------------------------------  Cardiac Enzymes No results for input(s): TROPONINI  in the last 168 hours. ------------------------------------------------------------------------------------------------------------------  RADIOLOGY:  No  results found.  ASSESSMENT AND PLAN:   Active Problems:   DKA (diabetic ketoacidoses) (HCC)  * DKA   Responded to IV insuline, start on home dose insuline ( 06/04/17)  Again had DKA overnight, resume insuline drip.   Now off, but was given a small dose of lantus in ICU, bl sugar is > 400.    Resume higher dose of 70/30 to prevent DKA again, monitor.   He ran out of insuline, will need prescription for insuline on d.c.  * hyponatremia   Hyperkalemia   Dehydration     Due to DKA, getting better with treatment.  * pain - genrealized   Also c/o " kidney " pain   Checked and negative UA    Pain seeking behaviour   Avoid opioids.  * Smoking   Councelled to Quit smoking for 4 min.  All the records are reviewed and case discussed with Care Management/Social Workerr. Management plans discussed with the patient, family and they are in agreement.  CODE STATUS: full  TOTAL TIME TAKING CARE OF THIS PATIENT: 35 minutes.    POSSIBLE D/C IN 1-2 DAYS, DEPENDING ON CLINICAL CONDITION.   Altamese DillingVaibhavkumar Kaelon Weekes M.D on 06/06/2017   Between 7am to 6pm - Pager - [phone]  After 6pm go to [email or web address] - password [email or web address]  [facility]  Office  [phone]  CC: Primary care physician; Patient, No Pcp Per  Note: This dictation was prepared with Dragon dictation along with smaller phrase technology. Any transcriptional errors that result from this process are unintentional.

## 2017-06-07 LAB — GLUCOSE, CAPILLARY
Glucose-Capillary: 110 mg/dL — ABNORMAL HIGH (ref 65–99)
Glucose-Capillary: 203 mg/dL — ABNORMAL HIGH (ref 65–99)
Glucose-Capillary: 231 mg/dL — ABNORMAL HIGH (ref 65–99)

## 2017-06-07 LAB — BASIC METABOLIC PANEL
Anion gap: 9 (ref 5–15)
BUN: 24 mg/dL — ABNORMAL HIGH (ref 6–20)
CO2: 21 mmol/L — ABNORMAL LOW (ref 22–32)
Calcium: 9 mg/dL (ref 8.9–10.3)
Chloride: 104 mmol/L (ref 101–111)
Creatinine, Ser: 0.6 mg/dL — ABNORMAL LOW (ref 0.61–1.24)
GFR calc Af Amer: >60 mL/min (ref >60)
GFR calc non Af Amer: >60 mL/min (ref >60)
Glucose, Bld: 246 mg/dL — ABNORMAL HIGH (ref 65–99)
Potassium: 4 mmol/L (ref 3.5–5.1)
Sodium: 134 mmol/L — ABNORMAL LOW (ref 135–145)

## 2017-06-07 MED ORDER — INSULIN ASPART PROT & ASPART (70-30 MIX) 100 UNIT/ML ~~LOC~~ SUSP: 34.0000 [IU] | Freq: Two times a day (BID) | SUBCUTANEOUS | Status: DC

## 2017-06-07 MED ORDER — INSULIN ASPART PROT & ASPART (70-30 MIX) 100 UNIT/ML ~~LOC~~ SUSP: SUBCUTANEOUS | 0 refills | Status: DC

## 2017-06-07 MED ORDER — INSULIN ASPART 100 UNIT/ML ~~LOC~~ SOLN
5.0000 [IU] | Freq: Three times a day (TID) | SUBCUTANEOUS | Status: DC
  Filled 2017-06-07: qty 0.1

## 2017-06-07 MED ORDER — INSULIN ASPART 100 UNIT/ML ~~LOC~~ SOLN: SUBCUTANEOUS | 1 refills | Status: DC

## 2017-06-07 MED ORDER — INSULIN ASPART PROT & ASPART (70-30 MIX) 100 UNIT/ML ~~LOC~~ SUSP
34.0000 [IU] | Freq: Two times a day (BID) | SUBCUTANEOUS | Status: DC
  Filled 2017-06-07: qty 10

## 2017-06-07 MED ORDER — HYDROCODONE-ACETAMINOPHEN 7.5-325 MG PO TABS
1.0000 | ORAL_TABLET | Freq: Four times a day (QID) | ORAL | Status: DC | PRN
  Administered 2017-06-07: 1 via ORAL
  Filled 2017-06-07: qty 1

## 2017-06-07 MED ORDER — INSULIN ASPART 100 UNIT/ML ~~LOC~~ SOLN
5.0000 [IU] | Freq: Three times a day (TID) | SUBCUTANEOUS | Status: DC
  Filled 2017-06-07 (×13): qty 10

## 2017-06-07 NOTE — Care Management Note (Signed)
Case Management Note  Patient Details  Name: Trevor Brown MRN: [id number] Date of Birth: 16-Jan-1991  Subjective/Objective:      Discussed lack of options for obtaining insulin for home use on the weekend with Nicolasa DuckingSally Holland, Assistant Director of Case Management. With Sally's permission, [name] Novolog 70/30 were disp[facility] to him [facility] to take home with him today.              Action/Plan:   Expected Discharge Date:  06/07/17               Expected Discharge Plan:     In-House Referral:  PCP / [facility]  Discharge planning Services  CM Consult, [facility], Medication Assistance  Post Acute Care Choice:    Choice offered to:  Patient  DME Arranged:    DME Agency:     HH Arranged:    HH Agency:     Status of Service:  Completed, signed off  If discussed at [employer] Meetings, dates discussed:    Additional Comments:  [name], RN 06/07/2017, 3:42 PM

## 2017-06-07 NOTE — Discharge Instructions (Signed)
Diabetic diet

## 2017-06-09 NOTE — Discharge Summary (Signed)
[employer] [facility] [place] [facility]   PATIENT NAME: Trevor Brown    MR#:  [id number]  DATE OF BIRTH:  1990/12/06  DATE OF ADMISSION:  06/03/2017 ADMITTING PHYSICIAN: Ihor AustinPavan Pyreddy, MD  DATE OF DISCHARGE: 06/07/2017  5:45 PM  PRIMARY CARE PHYSICIAN: Patient, No Pcp Per   ADMISSION DIAGNOSIS:  Diabetic ketoacidosis without coma associated with type 1 diabetes mellitus (HCC) [E10.10]  DISCHARGE DIAGNOSIS:  Active Problems:   DKA (diabetic ketoacidoses) (HCC)   SECONDARY DIAGNOSIS:   Past Medical History:  Diagnosis Date  . Diabetes mellitus without complication (HCC)   . Heart attack (HCC)    Pt claims he had heart attack a year ago ( 2015)- and was [facility] in [facility], but not given any meds or angiogram, on review of chart- I could not find any details like that.  . Hepatitis C, acute may 2016  . Hypertension      ADMITTING HISTORY  HISTORY OF PRESENT ILLNESS: Trevor Brown  is a 26 y.o. male with a known history of insulin-dependent diabetes mellitus, hepatitis C, hypertension presented to the emergency room with elevated blood sugar. Patient check blood sugar at home was high. Blood sugar in the emergency room was 6 21 mg/dL. Patient had elevated anion gap and was in diabetic ketoacidosis. No complaints of any nausea and vomiting. Patient says he has ran out of his insulin medication. Patient was started on IV insulin drip in the emergency room and IV fluids and hospitalist service was consulted. No complaints of any chest pain, shortness of breath.    HOSPITAL COURSE:   *DKA *Severe dehydration *Hyponatremia *Hyperkalemia *Noncompliance *Tobacco abuse  Patient was admitted to the ICU on DKA protocol.  With this he improved well.  Later transition to his home dose of insulin along with sliding scale insulin.  Blood sugars have improved.  He does have chronic abdominal pain which is unchanged.  Afebrile.  He feels back to normal.  This was 1 of his  many admissions for DKA due to 2 noncompliance.  The patient was counseled multiple times.  Discharge home with prescriptions.  CONSULTS OBTAINED:  Treatment Team:  Merwyn KatosSimonds, David B, MD  DRUG ALLERGIES:   Allergies  Allergen Reactions  . Bee Venom Anaphylaxis  . Penicillins Anaphylaxis, Hives and Other (See Comments)    Has patient had a PCN reaction causing immediate rash, facial/tongue/throat swelling, SOB or lightheadedness with hypotension: Yes Has patient had a PCN reaction causing severe rash involving mucus membranes or skin necrosis: No Has patient had a PCN reaction that required hospitalization No Has patient had a PCN reaction occurring within the last 10 years: Yes If all of the above answers are "NO", then may proceed with Cephalosporin use.  . Ibuprofen Other (See Comments)    Other reaction(s): Other (See Comments) Stomach upset Reaction:  GI upset   . Tramadol Hives and Swelling  . Vancomycin Rash and Other (See Comments)    Reaction:  Red man's syndrome     DISCHARGE MEDICATIONS:   Discharge Medication List as of 06/07/2017  5:31 PM    CONTINUE these medications which have CHANGED   Details  insulin aspart (NOVOLOG) 100 UNIT/ML injection Give 5 units subcutaneously before meals if sugar 120- 250;  Give 8 units if sugar 251- 350; give 10 units if sugar greater than 351, Print    insulin aspart protamine- aspart (NOVOLOG MIX 70/30) (70-30) 100 UNIT/ML injection 42 units subcutaneous injection with breakfast, 34 units  subcutaneous injection with dinner, Print      CONTINUE these medications which have NOT CHANGED   Details  clindamycin (CLEOCIN) 300 MG capsule Take 1 capsule (300 mg total) by mouth every 8 (eight) hours., Starting Sun 04/20/2017, Print    nicotine (NICODERM CQ - DOSED IN MG/24 HOURS) 21 mg/24hr patch Place 1 patch (21 mg total) onto the skin daily., Starting Mon 04/21/2017, Print    polyethylene glycol (MIRALAX / GLYCOLAX) packet Take 17 g by  mouth daily as needed for moderate constipation., Starting Mon 04/21/2017, Print        Today   VITAL SIGNS:  Blood pressure 121/66, pulse 86, temperature 98.6 F (37 C), temperature source Oral, resp. rate 18, height 5\' 6"  (1.676 m), weight 53 kg (116 lb 14.4 oz), SpO2 99 %.  I/O:  No intake or output data in the 24 hours ending 06/09/17 1246  PHYSICAL EXAMINATION:  Physical Exam  GENERAL:  26 y.o.-year-old patient lying in the bed with no acute distress.  LUNGS: Normal breath sounds bilaterally, no wheezing, rales,rhonchi or crepitation. No use of accessory muscles of respiration.  CARDIOVASCULAR: S1, S2 normal. No murmurs, rubs, or gallops.  ABDOMEN: Soft, non-tender, non-distended. Bowel sounds present. No organomegaly or mass.  NEUROLOGIC: Moves all 4 extremities. PSYCHIATRIC: The patient is alert and oriented x 3.  SKIN: No obvious rash, lesion, or ulcer.   DATA REVIEW:   CBC Recent Labs  Lab 06/05/17 0613  WBC 9.7  HGB 12.9*  HCT 39.0*  PLT 335    Chemistries  Recent Labs  Lab 06/07/17 0412  NA 134*  K 4.0  CL 104  CO2 21*  GLUCOSE 246*  BUN 24*  CREATININE 0.60*  CALCIUM 9.0    Cardiac Enzymes No results for input(s): TROPONINI in the last 168 hours.  Microbiology Results  Results for orders placed or performed during the hospital encounter of 06/03/17  MRSA PCR Screening     Status: Abnormal   Collection Time: 06/04/17  8:01 AM  Result Value Ref Range Status   MRSA by PCR POSITIVE (A) NEGATIVE Final    Comment:        The GeneXpert MRSA Assay (FDA approved for NASAL specimens only), is one component of a comprehensive MRSA colonization surveillance program. It is not intended to diagnose MRSA infection nor to guide or monitor treatment for MRSA infections. RESULT CALLED TO, READ BACK BY AND VERIFIED WITH: SANDRA BORBA ON 06/04/17 AT 1030 QSD     RADIOLOGY:  No results found.  Follow up with PCP in 1 week.  Management plans  discussed with the patient, family and they are in agreement.  CODE STATUS:  Code Status History    Date Active Date Inactive Code Status Order ID Comments User Context   06/05/2017 04:56 06/07/2017 20:52 Full Code [id number]  Arnaldo Natal, MD Inpatient   06/04/2017 02:59 06/05/2017 04:56 Full Code [id number]  Ihor Austin, MD Inpatient   04/17/2017 10:55 04/21/2017 15:59 Full Code [id number]  Ramonita Lab, MD Inpatient   02/03/2017 13:36 02/04/2017 21:28 Full Code [id number]  Gracelyn Nurse, MD ED   10/13/2016 05:26 10/15/2016 18:12 Full Code [id number]  Arnaldo Natal, MD Inpatient   09/08/2016 03:52 09/08/2016 18:37 Full Code [id number]  Tonye Royalty, DO Inpatient   08/12/2016 22:20 08/13/2016 17:10 Full Code [id number]  Katharina Caper, MD Inpatient   07/04/2016 12:24 07/05/2016 19:36 Full Code [id number]  Altamese Dilling, MD Inpatient   07/04/2016 12:24 07/04/2016 12:24 Full Code  [id number]  Altamese DillingVachhani, Vaibhavkumar, MD Inpatient   04/09/2016 16:54 04/11/2016 14:09 Full Code [id number]  Katharina CaperVaickute, Rima, MD Inpatient   03/26/2016 01:18 03/27/2016 19:11 Full Code [id number]  Tonye RoyaltyHugelmeyer, Alexis, DO Inpatient   03/18/2016 08:15 03/21/2016 20:35 Full Code [id number]  Wyatt HasteHower, David K, MD ED   03/12/2016 12:20 03/16/2016 19:27 Full Code [id number]  Enedina FinnerPatel, Sona, MD Inpatient   02/18/2016 16:59 02/19/2016 17:18 Full Code [id number]  Ramonita LabGouru, Aruna, MD ED   01/12/2016 08:58 01/15/2016 19:33 Full Code [id number]  Milagros LollSudini, Nataya Bastedo, MD ED   01/11/2016 00:35 01/11/2016 23:20 Full Code [id number]  Gery Prayrosley, Debby, MD Inpatient   12/12/2015 22:04 12/14/2015 18:38 Full Code [id number]  Katharina CaperVaickute, Rima, MD Inpatient   10/23/2015 17:42 10/25/2015 19:04 Full Code [id number]  Katha HammingKonidena, Snehalatha, MD ED   06/10/2015 08:20 06/12/2015 14:41 Full Code [id number]  Arnaldo Nataliamond, Michael S, MD Inpatient   06/06/2015 20:43 06/07/2015 16:20 Full Code [id number]  Enedina FinnerPatel, Sona, MD Inpatient   04/07/2015 12:45 04/08/2015 20:37 Full Code [id number]  Altamese DillingVachhani,  Vaibhavkumar, MD Inpatient   12/09/2014 14:44 12/13/2014 16:16 Full Code [id number]  Gale JourneyWalsh, Catherine P, MD Inpatient      TOTAL TIME TAKING CARE OF THIS PATIENT ON DAY OF DISCHARGE: more than 30 minutes.   Molinda BailiffSrikar R Shelvy Heckert M.D on 06/09/2017 at 12:46 PM  Between 7am to 6pm - Pager - [phone]  After 6pm go to [email or web address] - password [email or web address]  [facility]  Office  [phone]  CC: Primary care physician; Patient, No Pcp Per  Note: This dictation was prepared with Dragon dictation along with smaller phrase technology. Any transcriptional errors that result from this process are unintentional.

## 2017-06-17 ENCOUNTER — Other Ambulatory Visit: Payer: Self-pay | Admitting: Internal Medicine

## 2017-06-29 ENCOUNTER — Inpatient Hospital Stay
Admission: EM | Admit: 2017-06-29 | Discharge: 2017-06-30 | DRG: 638 | Disposition: A | Discharge status: Home / Self Care | Payer: Self-pay | Attending: Internal Medicine | Admitting: Internal Medicine

## 2017-06-29 ENCOUNTER — Encounter: Payer: Self-pay | Admitting: Emergency Medicine

## 2017-06-29 ENCOUNTER — Other Ambulatory Visit: Payer: Self-pay

## 2017-06-29 DIAGNOSIS — R739 Hyperglycemia, unspecified: Secondary | ICD-10-CM

## 2017-06-29 DIAGNOSIS — Z79899 Other long term (current) drug therapy: Secondary | ICD-10-CM

## 2017-06-29 DIAGNOSIS — R109 Unspecified abdominal pain: Secondary | ICD-10-CM

## 2017-06-29 DIAGNOSIS — Z885 Allergy status to narcotic agent status: Secondary | ICD-10-CM

## 2017-06-29 DIAGNOSIS — Z9114 Patient's other noncompliance with medication regimen: Secondary | ICD-10-CM

## 2017-06-29 DIAGNOSIS — Z720 Tobacco use: Secondary | ICD-10-CM | POA: Diagnosis present

## 2017-06-29 DIAGNOSIS — Z91030 Bee allergy status: Secondary | ICD-10-CM

## 2017-06-29 DIAGNOSIS — E871 Hypo-osmolality and hyponatremia: Secondary | ICD-10-CM

## 2017-06-29 DIAGNOSIS — Z880 Allergy status to penicillin: Secondary | ICD-10-CM

## 2017-06-29 DIAGNOSIS — E1110 Type 2 diabetes mellitus with ketoacidosis without coma: Secondary | ICD-10-CM | POA: Diagnosis present

## 2017-06-29 DIAGNOSIS — Z91128 Patient's intentional underdosing of medication regimen for other reason: Secondary | ICD-10-CM

## 2017-06-29 DIAGNOSIS — Z881 Allergy status to other antibiotic agents status: Secondary | ICD-10-CM

## 2017-06-29 DIAGNOSIS — Z886 Allergy status to analgesic agent status: Secondary | ICD-10-CM

## 2017-06-29 DIAGNOSIS — T383X6A Underdosing of insulin and oral hypoglycemic [antidiabetic] drugs, initial encounter: Secondary | ICD-10-CM | POA: Diagnosis present

## 2017-06-29 DIAGNOSIS — X58XXXA Exposure to other specified factors, initial encounter: Secondary | ICD-10-CM | POA: Diagnosis present

## 2017-06-29 DIAGNOSIS — E860 Dehydration: Secondary | ICD-10-CM | POA: Diagnosis present

## 2017-06-29 DIAGNOSIS — Z794 Long term (current) use of insulin: Secondary | ICD-10-CM

## 2017-06-29 DIAGNOSIS — I10 Essential (primary) hypertension: Secondary | ICD-10-CM | POA: Diagnosis present

## 2017-06-29 DIAGNOSIS — E1010 Type 1 diabetes mellitus with ketoacidosis without coma: Principal | ICD-10-CM | POA: Diagnosis present

## 2017-06-29 DIAGNOSIS — F17210 Nicotine dependence, cigarettes, uncomplicated: Secondary | ICD-10-CM | POA: Diagnosis present

## 2017-06-29 LAB — MRSA PCR SCREENING: MRSA by PCR: NEGATIVE

## 2017-06-29 LAB — GLUCOSE, CAPILLARY
Glucose-Capillary: 513 mg/dL (ref 65–99)
Glucose-Capillary: 535 mg/dL (ref 65–99)
Glucose-Capillary: 438 mg/dL — ABNORMAL HIGH (ref 65–99)
Glucose-Capillary: 310 mg/dL — ABNORMAL HIGH (ref 65–99)
Glucose-Capillary: 334 mg/dL — ABNORMAL HIGH (ref 65–99)
Glucose-Capillary: 245 mg/dL — ABNORMAL HIGH (ref 65–99)
Glucose-Capillary: 187 mg/dL — ABNORMAL HIGH (ref 65–99)
Glucose-Capillary: 121 mg/dL — ABNORMAL HIGH (ref 65–99)
Glucose-Capillary: 123 mg/dL — ABNORMAL HIGH (ref 65–99)
Glucose-Capillary: 223 mg/dL — ABNORMAL HIGH (ref 65–99)
Glucose-Capillary: 255 mg/dL — ABNORMAL HIGH (ref 65–99)
Glucose-Capillary: 134 mg/dL — ABNORMAL HIGH (ref 65–99)

## 2017-06-29 LAB — URINALYSIS, COMPLETE (UACMP) WITH MICROSCOPIC
Bacteria, UA: NONE SEEN
Bilirubin Urine: NEGATIVE
Glucose, UA: >=500 mg/dL — AB
Ketones, ur: 80 mg/dL — AB
Leukocytes, UA: NEGATIVE
Nitrite: NEGATIVE
Protein, ur: NEGATIVE mg/dL
Specific Gravity, Urine: 1.024 (ref 1.005–1.030)
pH: 5 (ref 5.0–8.0)

## 2017-06-29 LAB — BASIC METABOLIC PANEL
Anion gap: 7 (ref 5–15)
BUN: 9 mg/dL (ref 6–20)
CO2: 23 mmol/L (ref 22–32)
Calcium: 8.3 mg/dL — ABNORMAL LOW (ref 8.9–10.3)
Chloride: 105 mmol/L (ref 101–111)
Creatinine, Ser: 0.64 mg/dL (ref 0.61–1.24)
GFR calc Af Amer: >60 mL/min (ref >60)
GFR calc non Af Amer: >60 mL/min (ref >60)
Glucose, Bld: 121 mg/dL — ABNORMAL HIGH (ref 65–99)
Potassium: 4.1 mmol/L (ref 3.5–5.1)
Sodium: 135 mmol/L (ref 135–145)

## 2017-06-29 LAB — COMPREHENSIVE METABOLIC PANEL
ALT: 182 U/L — ABNORMAL HIGH (ref 17–63)
AST: 283 U/L — ABNORMAL HIGH (ref 15–41)
Albumin: 3.8 g/dL (ref 3.5–5.0)
Alkaline Phosphatase: 259 U/L — ABNORMAL HIGH (ref 38–126)
Anion gap: 19 — ABNORMAL HIGH (ref 5–15)
BUN: 16 mg/dL (ref 6–20)
CO2: 17 mmol/L — ABNORMAL LOW (ref 22–32)
Calcium: 8.6 mg/dL — ABNORMAL LOW (ref 8.9–10.3)
Chloride: 94 mmol/L — ABNORMAL LOW (ref 101–111)
Creatinine, Ser: 1.12 mg/dL (ref 0.61–1.24)
GFR calc Af Amer: >60 mL/min (ref >60)
GFR calc non Af Amer: >60 mL/min (ref >60)
Glucose, Bld: 581 mg/dL (ref 65–99)
Potassium: 5.5 mmol/L — ABNORMAL HIGH (ref 3.5–5.1)
Sodium: 130 mmol/L — ABNORMAL LOW (ref 135–145)
Total Bilirubin: 1.6 mg/dL — ABNORMAL HIGH (ref 0.3–1.2)
Total Protein: 7.7 g/dL (ref 6.5–8.1)

## 2017-06-29 LAB — CBC WITH DIFFERENTIAL/PLATELET
Basophils Absolute: 0 10*3/uL (ref 0–0.1)
Basophils Relative: 0 %
Eosinophils Absolute: 0 10*3/uL (ref 0–0.7)
Eosinophils Relative: 0 %
HCT: 40.1 % (ref 40.0–52.0)
Hemoglobin: 13 g/dL (ref 13.0–18.0)
Lymphocytes Relative: 8 %
Lymphs Abs: 1.1 10*3/uL (ref 1.0–3.6)
MCH: 29.4 pg (ref 26.0–34.0)
MCHC: 32.4 g/dL (ref 32.0–36.0)
MCV: 90.7 fL (ref 80.0–100.0)
Monocytes Absolute: 0.8 10*3/uL (ref 0.2–1.0)
Monocytes Relative: 6 %
Neutro Abs: 11.7 10*3/uL — ABNORMAL HIGH (ref 1.4–6.5)
Neutrophils Relative %: 86 %
Platelets: 273 10*3/uL (ref 150–440)
RBC: 4.43 MIL/uL (ref 4.40–5.90)
RDW: 13.6 % (ref 11.5–14.5)
WBC: 13.6 10*3/uL — ABNORMAL HIGH (ref 3.8–10.6)

## 2017-06-29 LAB — BETA-HYDROXYBUTYRIC ACID: Beta-Hydroxybutyric Acid: 7.37 mmol/L — ABNORMAL HIGH (ref 0.05–0.27)

## 2017-06-29 LAB — MAGNESIUM: Magnesium: 2.1 mg/dL (ref 1.7–2.4)

## 2017-06-29 MED ORDER — INSULIN ASPART 100 UNIT/ML ~~LOC~~ SOLN: 0.0000 [IU] | Freq: Three times a day (TID) | SUBCUTANEOUS | Status: DC

## 2017-06-29 MED ORDER — INSULIN ASPART PROT & ASPART (70-30 MIX) 100 UNIT/ML ~~LOC~~ SUSP: 22.0000 [IU] | Freq: Every day | SUBCUTANEOUS | Status: DC

## 2017-06-29 MED ORDER — HEPARIN SODIUM (PORCINE) 5000 UNIT/ML IJ SOLN
5000.0000 [IU] | Freq: Three times a day (TID) | INTRAMUSCULAR | Status: DC
  Filled 2017-06-29 (×2): qty 1

## 2017-06-29 MED ORDER — SODIUM CHLORIDE 0.9 % IV SOLN
INTRAVENOUS | Status: DC
  Filled 2017-06-29: qty 1

## 2017-06-29 MED ORDER — MAGNESIUM SULFATE 2 GM/50ML IV SOLN
2.0000 g | Freq: Once | INTRAVENOUS | Status: AC
  Administered 2017-06-29: 2 g via INTRAVENOUS
  Filled 2017-06-29: qty 50

## 2017-06-29 MED ORDER — SODIUM CHLORIDE 0.9 % IV SOLN
INTRAVENOUS | Status: DC
  Administered 2017-06-29: 15:00:00 via INTRAVENOUS

## 2017-06-29 MED ORDER — ONDANSETRON HCL 4 MG PO TABS: 4.0000 mg | ORAL_TABLET | Freq: Four times a day (QID) | ORAL | Status: DC | PRN

## 2017-06-29 MED ORDER — ACETAMINOPHEN 650 MG RE SUPP: 650.0000 mg | Freq: Four times a day (QID) | RECTAL | Status: DC | PRN

## 2017-06-29 MED ORDER — ONDANSETRON HCL 4 MG/2ML IJ SOLN: 4.0000 mg | Freq: Four times a day (QID) | INTRAMUSCULAR | Status: DC | PRN

## 2017-06-29 MED ORDER — INSULIN GLARGINE 100 UNIT/ML ~~LOC~~ SOLN
40.0000 [IU] | Freq: Once | SUBCUTANEOUS | Status: AC
  Administered 2017-06-29: 40 [IU] via SUBCUTANEOUS
  Filled 2017-06-29: qty 0.4

## 2017-06-29 MED ORDER — LORAZEPAM 2 MG/ML IJ SOLN: 0.5000 mg | INTRAMUSCULAR | Status: DC | PRN

## 2017-06-29 MED ORDER — ACETAMINOPHEN 325 MG PO TABS: 650.0000 mg | ORAL_TABLET | Freq: Four times a day (QID) | ORAL | Status: DC | PRN

## 2017-06-29 MED ORDER — BISACODYL 10 MG RE SUPP: 10.0000 mg | Freq: Every day | RECTAL | Status: DC | PRN

## 2017-06-29 MED ORDER — MORPHINE SULFATE (PF) 2 MG/ML IV SOLN
2.0000 mg | INTRAVENOUS | Status: DC | PRN
  Administered 2017-06-29 – 2017-06-30 (×7): 2 mg via INTRAVENOUS
  Filled 2017-06-29 (×7): qty 1

## 2017-06-29 MED ORDER — NICOTINE 21 MG/24HR TD PT24
21.0000 mg | MEDICATED_PATCH | Freq: Every day | TRANSDERMAL | Status: DC
  Administered 2017-06-29 – 2017-06-30 (×2): 21 mg via TRANSDERMAL
  Filled 2017-06-29 (×2): qty 1

## 2017-06-29 MED ORDER — DEXTROSE-NACL 5-0.45 % IV SOLN
INTRAVENOUS | Status: DC
  Administered 2017-06-29: 18:00:00 via INTRAVENOUS

## 2017-06-29 MED ORDER — PANTOPRAZOLE SODIUM 40 MG IV SOLR
40.0000 mg | Freq: Two times a day (BID) | INTRAVENOUS | Status: DC
  Administered 2017-06-29: 40 mg via INTRAVENOUS
  Filled 2017-06-29: qty 40

## 2017-06-29 MED ORDER — SODIUM CHLORIDE 0.9 % IV BOLUS (SEPSIS)
1000.0000 mL | Freq: Once | INTRAVENOUS | Status: AC
  Administered 2017-06-29: 1000 mL via INTRAVENOUS

## 2017-06-29 MED ORDER — SODIUM CHLORIDE 0.9 % IV SOLN
INTRAVENOUS | Status: DC
  Administered 2017-06-29: 4.8 [IU]/h via INTRAVENOUS
  Filled 2017-06-29: qty 1

## 2017-06-29 MED ORDER — SODIUM CHLORIDE 0.9 % IV SOLN: INTRAVENOUS | Status: DC

## 2017-06-29 MED ORDER — INSULIN ASPART PROT & ASPART (70-30 MIX) 100 UNIT/ML ~~LOC~~ SUSP
28.0000 [IU] | Freq: Every day | SUBCUTANEOUS | Status: DC
  Administered 2017-06-30: 28 [IU] via SUBCUTANEOUS
  Filled 2017-06-29: qty 1

## 2017-06-29 MED ORDER — DOCUSATE SODIUM 100 MG PO CAPS
100.0000 mg | ORAL_CAPSULE | Freq: Two times a day (BID) | ORAL | Status: DC
  Administered 2017-06-30: 100 mg via ORAL
  Filled 2017-06-29 (×2): qty 1

## 2017-06-29 NOTE — H&P (Signed)
History and Physical    Trevor Darlingony B Tolles QMV:[id number] DOB: January 19, 1991 DOA: 06/29/2017  Referring physician: Dr. Lamont Snowballifenbark PCP: Patient, No Pcp Per  Specialists: none  Chief Complaint: abdominal pain  HPI: Trevor Brown is a 26 y.o. male has a past medical history significant for Dm, HTN, tobacco use, and non-compliance who presents to ER with abdominal pain. In ER, pt found to be in DKA with sugar >500. He is now admitted. No fever. Denies CP or SOB. No N/V/D.  Review of Systems: The patient denies anorexia, fever, weight loss,, vision loss, decreased hearing, hoarseness, chest pain, syncope, dyspnea on exertion, peripheral edema, balance deficits, hemoptysis, a melena, hematochezia, severe indigestion/heartburn, hematuria, incontinence, genital sores, muscle weakness, suspicious skin lesions, transient blindness, difficulty walking, depression, unusual weight change, abnormal bleeding, enlarged lymph nodes, angioedema, and breast masses.   Past Medical History:  Diagnosis Date  . Diabetes mellitus without complication (HCC)   . Heart attack (HCC)    Pt claims he had heart attack a year ago ( 2015)- and was admitted in [facility] for that, but not given any meds or angiogram, on review of chart- I could not find any details like that.  . Hepatitis C, acute may 2016  . Hypertension    Past Surgical History:  Procedure Laterality Date  . none     Social History:  reports that he has been smoking cigarettes.  He has been smoking about 2.00 packs per day. he has never used smokeless tobacco. He reports that he uses drugs. Drug: IV. He reports that he does not drink alcohol.  Allergies  Allergen Reactions  . Bee Venom Anaphylaxis  . Penicillins Anaphylaxis, Hives and Other (See Comments)    Has patient had a PCN reaction causing immediate rash, facial/tongue/throat swelling, SOB or lightheadedness with hypotension: Yes Has patient had a PCN reaction causing severe rash involving mucus  membranes or skin necrosis: No Has patient had a PCN reaction that required hospitalization No Has patient had a PCN reaction occurring within the last 10 years: Yes If all of the above answers are "NO", then may proceed with Cephalosporin use.  . Ibuprofen Other (See Comments)    Other reaction(s): Other (See Comments) Stomach upset Reaction:  GI upset   . Tramadol Hives and Swelling  . Vancomycin Rash and Other (See Comments)    Reaction:  Red man's syndrome     Family History  Problem Relation Age of Onset  . Cirrhosis Mother   . Diabetes Mellitus II Maternal Grandmother   . Diabetes Mellitus II Maternal Grandfather     Prior to Admission medications   Medication Sig Start Date End Date Taking? Authorizing Provider  clindamycin (CLEOCIN) 300 MG capsule Take 1 capsule (300 mg total) by mouth every 8 (eight) hours. 04/20/17   Wieting, Richard, MD  insulin aspart (NOVOLOG) 100 UNIT/ML injection INJECT 5 UNITS BEFORE MEALS IF SUGAR IS 120 - 250. INJECT 8 UNITS IF 251 -350 AND INJECT 10 UNITS IF SUGAR IS GREATER THAN 351. 06/17/17   Virl Axehaplin, Don C, MD  insulin aspart protamine- aspart (NOVOLOG MIX 70/30) (70-30) 100 UNIT/ML injection 42 units subcutaneous injection with breakfast, 34 units subcutaneous injection with dinner 06/07/17   Sudini, Srikar, MD  nicotine (NICODERM CQ - DOSED IN MG/24 HOURS) 21 mg/24hr patch Place 1 patch (21 mg total) onto the skin daily. 04/21/17   Alford HighlandWieting, Richard, MD  polyethylene glycol (MIRALAX / GLYCOLAX) packet Take 17 g by mouth daily as needed for  moderate constipation. Patient not taking: Reported on 06/04/2017 04/21/17   Alford HighlandWieting, Richard, MD   Physical Exam: Vitals:   06/29/17 1108 06/29/17 1109 06/29/17 1113  BP: 124/74    Pulse: 83    Resp: 16    Temp:   98.4 F (36.9 C)  TempSrc:   Oral  SpO2: 100%    Weight:  54.4 kg (120 lb)      General:  No apparent distress, [place]/AT, WDWN  Eyes: PERRL, EOMI, no scleral icterus, conjunctiva  clear  ENT: moist oropharynx without exudate, TM's benign, dentition fair  Neck: supple, no lymphadenopathy. No bruits or thyromegaly  Cardiovascular: regular rate without MRG; 2+ peripheral pulses, no JVD, no peripheral edema  Respiratory: CTA biL, good air movement without wheezing, rhonchi or crackled. Respiratory effort normal  Abdomen: soft,  tender to palpation, positive bowel sounds, no guarding, no rebound  Skin: no rashes or lesions  Musculoskeletal: normal bulk and tone, no joint swelling  Psychiatric: normal mood and affect, A&OX3  Neurologic: CN 2-12 grossly intact, Motor strength 5/5 in all 4 groups with symmetric DTR's and non-focal sensory exam  Labs on Admission:  Basic Metabolic Panel: Recent Labs  Lab 06/29/17 1111  NA 130*  K 5.5*  CL 94*  CO2 17*  GLUCOSE 581*  BUN 16  CREATININE 1.12  CALCIUM 8.6*   Liver Function Tests: Recent Labs  Lab 06/29/17 1111  AST 283*  ALT 182*  ALKPHOS 259*  BILITOT 1.6*  PROT 7.7  ALBUMIN 3.8   No results for input(s): LIPASE, AMYLASE in the last 168 hours. No results for input(s): AMMONIA in the last 168 hours. CBC: Recent Labs  Lab 06/29/17 1111  WBC 13.6*  NEUTROABS 11.7*  HGB 13.0  HCT 40.1  MCV 90.7  PLT 273   Cardiac Enzymes: No results for input(s): CKTOTAL, CKMB, CKMBINDEX, TROPONINI in the last 168 hours.  BNP (last 3 results) No results for input(s): BNP in the last 8760 hours.  ProBNP (last 3 results) No results for input(s): PROBNP in the last 8760 hours.  CBG: Recent Labs  Lab 06/29/17 1112  GLUCAP 513*    Radiological Exams on Admission: No results found.  EKG: Independently reviewed.  Assessment/Plan Principal Problem:   DKA (diabetic ketoacidoses) (HCC) Active Problems:   Tobacco abuse   Abdominal pain   Hyponatremia   Will admit to Stepdown on insulin drip and IV fluids. IV morphine as needed for pain. Nicoderm patch ordered. Repeat labs in AM. DKA  protocol.  Diet: clear liquids Fluids: NS@125  DVT Prophylaxis: SQ Heparin  Code Status: FULL  Family Communication: none  Disposition Plan: home  Time spent: 50 min

## 2017-06-29 NOTE — ED Triage Notes (Signed)
Pt to ED via [facility] from home for hyperglycemia. EMS reports that pt told them his CBG was over 500 on his machine. EMS CBC 363. Pt states that he has been out of his insulin for the past few days, pt is non-compliant with his insulin. Pt states that he is having 8/10 abdominal pain on the left lower side. Pt in NAD at this time.

## 2017-06-29 NOTE — ED Provider Notes (Signed)
[facility] Emergency Department Provider Note  ____________________________________________   First MD Initiated Contact with Patient 06/29/17 1107     (approximate)  I have reviewed the triage vital signs and the nursing notes.   HISTORY  Chief Complaint Hyperglycemia   HPI Trevor Brown is a 26 y.o. male who comes to the emergency department via EMS requesting a refill of his insulin.  He is a type 1 insulin-dependent diabetic who last took his insulin 2 days ago.  He reports feeling "like hell" for the past 2 days.  He reports polyuria and polydipsia.  He denies fevers or chills.  He denies chest pain or shortness of breath.  He does report some mild diffuse abdominal pain and nausea but no vomiting.  No diarrhea.  Nothing seems to make his symptoms better or worse.  Pain is nonradiating.  His symptoms began gradually and have been slowly progressive.  Past Medical History:  Diagnosis Date  . Diabetes mellitus without complication (HCC)   . Heart attack (HCC)    Pt claims he had heart attack a year ago ( 2015)- and was admitted in [facility] that, but not given any meds or angiogram, on review of chart- I could not find any details like that.  . Hepatitis C, acute may 2016  . Hypertension     Patient Active Problem List   Diagnosis Date Noted  . Dental erosion extending into pulp 12/31/2016  . 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 Elevated transaminase level 03/27/2016  . Diabetes mellitus type 1 (HCC) 03/27/2016  . Tobacco abuse counseling 03/27/2016  . Transaminitis 03/25/2016  . Tobacco abuse 01/10/2016  . Type 1 diabetes mellitus with hyperglycemia (HCC) 12/12/2015  . Cocaine abuse (HCC) 12/12/2015  . DKA (diabetic ketoacidoses) (HCC) 06/10/2015  . Hidradenitis suppurativa of left axilla   . DKA, type 1 (HCC) 06/06/2015  . Malnutrition of moderate degree (HCC) 04/08/2015  . Hepatitis C 12/13/2014  . Diabetes type 1, uncontrolled (HCC) 12/11/2014  .  Major depressive disorder, single episode, mild (HCC)   . Major depression, single episode 12/10/2014  . Diabetes mellitus type 1, uncontrolled (HCC) 12/09/2014  . Homelessness 12/09/2014    Past Surgical History:  Procedure Laterality Date  . none      Prior to Admission medications   Medication Sig Start Date End Date Taking? Authorizing Provider  clindamycin (CLEOCIN) 300 MG capsule Take 1 capsule (300 mg total) by mouth every 8 (eight) hours. 04/20/17   Wieting, Richard, MD  insulin aspart (NOVOLOG) 100 UNIT/ML injection INJECT 5 UNITS BEFORE MEALS IF SUGAR IS 120 - 250. INJECT 8 UNITS IF 251 -350 AND INJECT 10 UNITS IF SUGAR IS GREATER THAN 351. 06/17/17   Virl Axehaplin, Don C, MD  insulin aspart protamine- aspart (NOVOLOG MIX 70/30) (70-30) 100 UNIT/ML injection 42 units subcutaneous injection with breakfast, 34 units subcutaneous injection with dinner 06/07/17   Sudini, Srikar, MD  nicotine (NICODERM CQ - DOSED IN MG/24 HOURS) 21 mg/24hr patch Place 1 patch (21 mg total) onto the skin daily. 04/21/17   Alford HighlandWieting, Richard, MD  polyethylene glycol (MIRALAX / GLYCOLAX) packet Take 17 g by mouth daily as needed for moderate constipation. Patient not taking: Reported on 06/04/2017 04/21/17   Alford HighlandWieting, Richard, MD    Allergies Bee venom; Penicillins; Ibuprofen; Tramadol; and Vancomycin  Family History  Problem Relation Age of Onset  . Cirrhosis Mother   . Diabetes Mellitus II Maternal Grandmother   . Diabetes Mellitus II Maternal Grandfather  Social History Social History   Tobacco Use  . Smoking status: Current Every Day Smoker    Packs/day: 2.00    Types: Cigarettes  . Smokeless tobacco: Never Used  Substance Use Topics  . Alcohol use: No    Alcohol/week: 0.0 oz  . 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 ENT: No sore throat. Cardiovascular: Denies chest  pain. Respiratory: Denies shortness of breath. Gastrointestinal: Positive for abdominal pain.  Positive for nausea, no vomiting.  No diarrhea.  No constipation. Genitourinary: Negative for dysuria. Musculoskeletal: Negative for back pain. Skin: Negative for rash. Neurological: Negative for headaches, focal weakness or numbness.   ____________________________________________   PHYSICAL EXAM:  VITAL SIGNS: ED Triage Vitals  Enc Vitals Group     BP      Pulse      Resp      Temp      Temp src      SpO2      Weight      Height      Head Circumference      Peak Flow      Pain Score      Pain Loc      Pain Edu?      Excl. in GC?     Constitutional: Alert and oriented x4 intermittently cooperative with exam no acute distress he does have some ketones on his breath Eyes: PERRL EOMI. Head: Atraumatic. Nose: No congestion/rhinnorhea. Mouth/Throat: No trismus Neck: No stridor.   Cardiovascular: Normal rate, regular rhythm. Grossly normal heart sounds.  Good peripheral circulation. Respiratory: Normal respiratory effort.  No retractions. Lungs CTAB and moving good air Gastrointestinal: Soft nontender Musculoskeletal: No lower extremity edema   Neurologic:  Normal speech and language. No gross focal neurologic deficits are appreciated. Skin:  Skin is warm, dry and intact. No rash noted. Psychiatric: Mood and affect are normal. Speech and behavior are normal.    ____________________________________________   DIFFERENTIAL includes but not limited to  Diabetic ketoacidosis, HHS, medication noncompliance, dehydration, hyperglycemia ____________________________________________   LABS (all labs ordered are listed, but only abnormal results are displayed)  Labs Reviewed  COMPREHENSIVE METABOLIC PANEL - Abnormal; Notable for the following components:      Result Value   Sodium 130 (*)    Potassium 5.5 (*)    Chloride 94 (*)    CO2 17 (*)    Glucose, Bld 581 (*)     Calcium 8.6 (*)    AST 283 (*)    ALT 182 (*)    Alkaline Phosphatase 259 (*)    Total Bilirubin 1.6 (*)    Anion gap 19 (*)    All other components within normal limits  CBC WITH DIFFERENTIAL/PLATELET - Abnormal; Notable for the following components:   WBC 13.6 (*)    Neutro Abs 11.7 (*)    All other components within normal limits  BLOOD GAS, VENOUS - Abnormal; Notable for the following components:   pCO2, Ven 37 (*)    All other components within normal limits  GLUCOSE, CAPILLARY - Abnormal; Notable for the following components:   Glucose-Capillary 513 (*)    All other components within normal limits  URINALYSIS, COMPLETE (UACMP) WITH MICROSCOPIC  BETA-HYDROXYBUTYRIC ACID    Blood work reviewed by me shows elevated glucose with increased anion gap low pH and low bicarbonate which is all  consistent with diabetic ketoacidosis __________________________________________  EKG   ____________________________________________  RADIOLOGY   ____________________________________________   PROCEDURES  Procedure(s) performed: yes  Angiocath insertion Performed by: Merrily BrittleNeil Ryann Pauli  Consent: Verbal consent obtained. Risks and benefits: risks, benefits and alternatives were discussed Time out: Immediately prior to procedure a "time out" was called to verify the correct patient, procedure, equipment, support staff and site/side marked as required.  Preparation: Patient was prepped and draped in the usual sterile fashion.  Vein Location: Left upper extremity  Ultrasound Guided  Gauge: 18  Normal blood return and flush without difficulty Patient tolerance: Patient tolerated the procedure well with no immediate complications.     .Critical Care Performed by: Merrily Brittleifenbark, Daniel Johndrow, MD Authorized by: Merrily Brittleifenbark, Dandy Lazaro, MD   Critical care provider statement:    Critical care time (minutes):  35   Critical care time was exclusive of:  Separately billable procedures and treating  other patients   Critical care was necessary to treat or prevent imminent or life-threatening deterioration of the following conditions:  Dehydration and endocrine crisis   Critical care was time spent personally by me on the following activities:  Ordering and performing treatments and interventions, development of treatment plan with patient or surrogate, ordering and review of laboratory studies, discussions with consultants, pulse oximetry, evaluation of patient's response to treatment, re-evaluation of patient's condition, examination of patient and review of old charts    Critical Care performed: yes  Observation: no ____________________________________________   INITIAL IMPRESSION / ASSESSMENT AND PLAN / ED COURSE  Pertinent labs & imaging results that were available during my care of the patient were reviewed by me and considered in my medical decision making (see chart for details).  The patient arrives lethargic appearing and clearly dehydrated.  He reports polyuria polydipsia with an elevated blood glucose which is concerning for diabetic ketoacidosis.  Fingerstick glucose today is over 500.  I will give him a liter of fluid and hold off on insulin until I have his potassium back.  ----------------------------------------- 12:05 PM on 06/29/2017 -----------------------------------------  The patient's pH is low, bicarb is low, anion gap is elevated along with elevated glucose which is consistent with diabetic ketoacidosis.  His K is 5.5 so it is safe to begin insulin drip.  At this point he requires inpatient admission to correct his numerous metabolic abnormalities.  I discussed with the patient who agrees with admission.  I then discussed with the hospitalist Dr. Judithann SheenSparks who has graciously agreed to admit the patient to his service.      ____________________________________________   FINAL CLINICAL IMPRESSION(S) / ED DIAGNOSES  Final diagnoses:  Hyperglycemia   Noncompliance with medications  Diabetic ketoacidosis without coma associated with type 1 diabetes mellitus (HCC)      NEW MEDICATIONS STARTED DURING THIS VISIT:  This SmartLink is deprecated. Use AVSMEDLIST instead to display the medication list for a patient.   Note:  This document was prepared using Dragon voice recognition software and may include unintentional dictation errors.     Merrily Brittleifenbark, Shahla Betsill, MD 06/29/17 [phone]

## 2017-06-29 NOTE — Progress Notes (Signed)
1300 assessment should read 1400 when patient arrived in ICU.

## 2017-06-29 NOTE — Consult Note (Signed)
[facility] [place] Critical Care Medicine Consultation    ASSESSMENT/PLAN   DKA secondary to noncompliance. Elevated blood sugar, acidotic, positive beta hydroxy acid, elevated urine ketones and glucose. This is a recurrent issue for him. Agree with hydration, DKA protocol, insulin infusion, close follow-up of electrolytes, magnesium, calcium, phosphorus.  Hyponatremia. Most likely serial secondary to elevated blood sugar  Acid-based derangement. Anion gap of 19, delta gap of 7, calculus starting bicarbonate 24 consistent with a simple anion gap metabolic acidosis.    Leukocytosis. Most likely reactive, no clear evidence of infection, will monitor closely   Name: Trevor Brown MRN: [id number] DOB: 29-Nov-1990    ADMISSION DATE:  06/29/2017 CONSULTATION DATE:  06/29/2017  REFERRING MD :  Hospitalist  CHIEF COMPLAINT:  Sore throat, ran out of insulin   HISTORY OF PRESENT ILLNESS:  Trevor Brown is a 26 year old gentleman with a past medical history of insulin-dependent diabetes with recurrent episodes of noncompliance presenting to the intensive care unit with recurrent episodes of DKA. He presented with abdominal pain, found to have a blood sugar greater than 500, positive urine ketones, acid-based derangement, admitted to the intensive care unit for management of DKA, replacement of electrolytes and fluid resuscitation.  PAST MEDICAL HISTORY :  Past Medical History:  Diagnosis Date  . Diabetes mellitus without complication (HCC)   . Heart attack (HCC)    Pt claims he had heart attack a year ago ( 2015)- and was [facility], but not given any meds or angiogram, on review of chart- I could not find any details like that.  . Hepatitis C, acute may 2016  . Hypertension    Past Surgical History:  Procedure Laterality Date  . none     Prior to Admission medications   Medication Sig Start Date End Date Taking? Authorizing Provider  insulin aspart (NOVOLOG) 100 UNIT/ML injection  INJECT 5 UNITS BEFORE MEALS IF SUGAR IS 120 - 250. INJECT 8 UNITS IF 251 -350 AND INJECT 10 UNITS IF SUGAR IS GREATER THAN 351. 06/17/17   Virl Axehaplin, Don C, MD  insulin aspart protamine- aspart (NOVOLOG MIX 70/30) (70-30) 100 UNIT/ML injection 42 units subcutaneous injection with breakfast, 34 units subcutaneous injection with dinner 06/07/17   Sudini, Srikar, MD  nicotine (NICODERM CQ - DOSED IN MG/24 HOURS) 21 mg/24hr patch Place 1 patch (21 mg total) onto the skin daily. 04/21/17   Alford HighlandWieting, Richard, MD   Allergies  Allergen Reactions  . Bee Venom Anaphylaxis  . Penicillins Anaphylaxis, Hives and Other (See Comments)    Has patient had a PCN reaction causing immediate rash, facial/tongue/throat swelling, SOB or lightheadedness with hypotension: Yes Has patient had a PCN reaction causing severe rash involving mucus membranes or skin necrosis: No Has patient had a PCN reaction that required hospitalization No Has patient had a PCN reaction occurring within the last 10 years: Yes If all of the above answers are "NO", then may proceed with Cephalosporin use.  . Ibuprofen Other (See Comments)    Other reaction(s): Other (See Comments) Stomach upset Reaction:  GI upset   . Tramadol Hives and Swelling  . Vancomycin Rash and Other (See Comments)    Reaction:  Red man's syndrome     FAMILY HISTORY:  Family History  Problem Relation Age of Onset  . Cirrhosis Mother   . Diabetes Mellitus II Maternal Grandmother   . Diabetes Mellitus II Maternal Grandfather    SOCIAL HISTORY:  reports that he has been smoking cigarettes.  He  has been smoking about 2.00 packs per day. he has never used smokeless tobacco. He reports that he uses drugs. Drug: IV. He reports that he does not drink alcohol.  REVIEW OF SYSTEMS:     The remainder of systems were reviewed and were found to be negative other than what is documented in the HPI.    VITAL SIGNS: Temp:  [98.3 F (36.8 C)-98.4 F (36.9 C)] 98.3 F (36.8  C) (12/09 1354) Pulse Rate:  [73-91] 73 (12/09 1354) Resp:  [16-24] 24 (12/09 1354) BP: (124-145)/(71-85) 145/85 (12/09 1354) SpO2:  [100 %] 100 % (12/09 1354) Weight:  [53.7 kg (118 lb 6.2 oz)-54.4 kg (120 lb)] 53.7 kg (118 lb 6.2 oz) (12/09 1354) HEMODYNAMICS:  INTAKE / OUTPUT:  Intake/Output Summary (Last 24 hours) at 06/29/2017 1416 Last data filed at 06/29/2017 1336 Gross per 24 hour  Intake -  Output 325 ml  Net -325 ml    Physical Examination:   VS: BP (!) 145/85   Pulse 73   Temp 98.3 F (36.8 C) (Oral)   Resp (!) 24   Ht 5\' 4"  (1.626 m)   Wt 53.7 kg (118 lb 6.2 oz)   SpO2 100%   BMI 20.32 kg/m   General Appearance: No distress  Neuro:without focal findings, mental status, speech normal,. HEENT: PERRLA, EOM intact, no ptosis, no other lesions noticed;  Pulmonary: normal breath sounds., Increased respiratory rate in the mid 20s CardiovascularNormal S1,S2.  Tachycardia Abdomen: Benign, Soft, non-tender, No masses, hepatosplenomegaly, No lymphadenopathy Renal:  No costovertebral tenderness  Skin:   warm, no rashes, no ecchymosis  Extremities: normal, no cyanosis, clubbing, no edema, warm with normal capillary refill.    LABS: Reviewed   LABORATORY PANEL:   CBC Recent Labs  Lab 06/29/17 1111  WBC 13.6*  HGB 13.0  HCT 40.1  PLT 273    Chemistries  Recent Labs  Lab 06/29/17 1111  NA 130*  K 5.5*  CL 94*  CO2 17*  GLUCOSE 581*  BUN 16  CREATININE 1.12  CALCIUM 8.6*  AST 283*  ALT 182*  ALKPHOS 259*  BILITOT 1.6*    Recent Labs  Lab 06/29/17 1112 06/29/17 1252 06/29/17 1354  GLUCAP 513* 535* 438*   No results for input(s): PHART, PCO2ART, PO2ART in the last 168 hours. Recent Labs  Lab 06/29/17 1111  AST 283*  ALT 182*  ALKPHOS 259*  BILITOT 1.6*  ALBUMIN 3.8    Cardiac Enzymes No results for input(s): TROPONINI in the last 168 hours.  RADIOLOGY:  No results found.   Tora KindredJohn Shikira Folino, DO  06/29/2017, 2:16 PM

## 2017-06-30 LAB — COMPREHENSIVE METABOLIC PANEL
ALT: 138 U/L — ABNORMAL HIGH (ref 17–63)
AST: 182 U/L — ABNORMAL HIGH (ref 15–41)
Albumin: 3 g/dL — ABNORMAL LOW (ref 3.5–5.0)
Alkaline Phosphatase: 209 U/L — ABNORMAL HIGH (ref 38–126)
Anion gap: 10 (ref 5–15)
BUN: 12 mg/dL (ref 6–20)
CO2: 20 mmol/L — ABNORMAL LOW (ref 22–32)
Calcium: 8.1 mg/dL — ABNORMAL LOW (ref 8.9–10.3)
Chloride: 100 mmol/L — ABNORMAL LOW (ref 101–111)
Creatinine, Ser: 0.69 mg/dL (ref 0.61–1.24)
GFR calc Af Amer: >60 mL/min (ref >60)
GFR calc non Af Amer: >60 mL/min (ref >60)
Glucose, Bld: 529 mg/dL (ref 65–99)
Potassium: 4.7 mmol/L (ref 3.5–5.1)
Sodium: 130 mmol/L — ABNORMAL LOW (ref 135–145)
Total Bilirubin: 0.8 mg/dL (ref 0.3–1.2)
Total Protein: 6.5 g/dL (ref 6.5–8.1)

## 2017-06-30 LAB — CBC
HCT: 38.3 % — ABNORMAL LOW (ref 40.0–52.0)
Hemoglobin: 12.7 g/dL — ABNORMAL LOW (ref 13.0–18.0)
MCH: 29.7 pg (ref 26.0–34.0)
MCHC: 33 g/dL (ref 32.0–36.0)
MCV: 90 fL (ref 80.0–100.0)
Platelets: 268 10*3/uL (ref 150–440)
RBC: 4.26 MIL/uL — ABNORMAL LOW (ref 4.40–5.90)
RDW: 13.8 % (ref 11.5–14.5)
WBC: 10.6 10*3/uL (ref 3.8–10.6)

## 2017-06-30 LAB — GLUCOSE, CAPILLARY
Glucose-Capillary: 451 mg/dL — ABNORMAL HIGH (ref 65–99)
Glucose-Capillary: 117 mg/dL — ABNORMAL HIGH (ref 65–99)
Glucose-Capillary: 260 mg/dL — ABNORMAL HIGH (ref 65–99)

## 2017-06-30 MED ORDER — INSULIN ASPART 100 UNIT/ML ~~LOC~~ SOLN
14.0000 [IU] | Freq: Once | SUBCUTANEOUS | Status: AC
  Administered 2017-06-30: 14 [IU] via SUBCUTANEOUS
  Filled 2017-06-30: qty 1

## 2017-06-30 MED ORDER — INSULIN ASPART PROT & ASPART (70-30 MIX) 100 UNIT/ML ~~LOC~~ SUSP: SUBCUTANEOUS | 2 refills | Status: DC

## 2017-06-30 NOTE — Progress Notes (Signed)
Verbal order from Dr. Lonn Georgiaonforti to turn insulin gtt off.  Will continue to monitor pt. Closely.

## 2017-06-30 NOTE — Discharge Instructions (Signed)
Take your insulin as prescribed and refill it prior to running out of it

## 2017-06-30 NOTE — Progress Notes (Signed)
06/30/2017 15:30  Trevor Brown to be D/C'd Home per MD order.  Discussed prescriptions and follow up appointments with the patient. Prescriptions given to patient, medication list explained in detail. Pt verbalized understanding.  Allergies as of 06/30/2017      Reactions   Bee Venom Anaphylaxis   Penicillins Anaphylaxis, Hives, Other (See Comments)   Has patient had a PCN reaction causing immediate rash, facial/tongue/throat swelling, SOB or lightheadedness with hypotension: Yes Has patient had a PCN reaction causing severe rash involving mucus membranes or skin necrosis: No Has patient had a PCN reaction that required hospitalization No Has patient had a PCN reaction occurring within the last 10 years: Yes If all of the above answers are "NO", then may proceed with Cephalosporin use.   Ibuprofen Other (See Comments)   Other reaction(s): Other (See Comments) Stomach upset Reaction:  GI upset    Tramadol Hives, Swelling   Vancomycin Rash, Other (See Comments)   Reaction:  Red man's syndrome       Medication List    TAKE these medications   insulin aspart 100 UNIT/ML injection Commonly known as:  NOVOLOG INJECT 5 UNITS BEFORE MEALS IF SUGAR IS 120 - 250. INJECT 8 UNITS IF 251 -350 AND INJECT 10 UNITS IF SUGAR IS GREATER THAN 351.   insulin aspart protamine- aspart (70-30) 100 UNIT/ML injection Commonly known as:  NOVOLOG MIX 70/30 28 units subcutaneous injection with breakfast,  22 units subcutaneous injection with dinner What changed:  additional instructions   nicotine 21 mg/24hr patch Commonly known as:  NICODERM CQ - dosed in mg/24 hours Place 1 patch (21 mg total) onto the skin daily.       Vitals:   06/30/17 0523 06/30/17 1408  BP: 122/73 (!) 145/86  Pulse: 78 81  Resp: 16   Temp: 98.4 F (36.9 C) 98.7 F (37.1 C)  SpO2: 99% 99%    Skin clean, dry and intact without evidence of skin break down, no evidence of skin tears noted. IV catheter discontinued  intact. Site without signs and symptoms of complications. Dressing and pressure applied. Pt denies pain at this time. No complaints noted.  An After Visit Summary was printed and given to the patient. Patient escorted via WC, and D/C home via private auto.  Bradly Chrisougherty, Aimie Wagman E

## 2017-06-30 NOTE — Progress Notes (Signed)
Inpatient Diabetes Program Recommendations  AACE/ADA: New Consensus Statement on Inpatient Glycemic Control (2015)  Target Ranges:  Prepandial:   less than 140 mg/dL      Peak postprandial:   less than 180 mg/dL (1-2 hours)      Critically ill patients:  140 - 180 mg/dL    Results for Trevor Brown, Trevor Brown (MRN [id number]) as of 06/30/2017 07:37  Ref. Range 06/29/2017 11:12 06/29/2017 12:52 06/29/2017 13:54 06/29/2017 15:01 06/29/2017 16:05 06/29/2017 17:04 06/29/2017 18:08 06/29/2017 19:09 06/29/2017 19:47 06/29/2017 21:06 06/29/2017 21:59 06/29/2017 23:05 06/30/2017 04:37  Glucose-Capillary Latest Ref Range: 65 - 99 mg/dL 409513 (HH) 811535 (HH) 914438 (H) 310 (H) 334 (H) 245 (H) 187 (H) 121 (H) 123 (H) 223 (H) 255 (H) 134 (H) 451 (H)    Admit with: DKA  History: Type 1 DM  Home DM Meds: 70/30 Insulin- 42 units AM/ 34 units PM       Novolog 5 units TID if CBG 120-250 mg/dl       Novolog 8 units TID if CBG 251-350 mg/dl       Novolog 10 units TID if CBG >351 mg/dl  Current Insulin Orders: 70/30 Insulin- 28 units AM/ 22 units PM         Note patient transitioned off IV insulin drip last night.  Given 40 units Lantus at 11pm and IV Insulin drip stopped.  CBG up to 451 mg/dl by 4am.  Novolog 14 units administered.  Note 70/30 Insulin to start this AM with Breakfast.  Patient well known to the Inpatient Glycemic Control Team.  This is patient's 7th admission since January 2018.    MD- Please consider starting Novolog Sensitive Correction Scale/ SSI (0-9 units) TID AC + HS  May need upward adjustment of 70/30 Insulin as patient takes quite a bit more 70/30 insulin at home, however, would like to see how he responds before making further recommendations.       --Will follow patient during hospitalization--  Ambrose FinlandJeannine Johnston Shonna Deiter RN, MSN, CDE Diabetes Coordinator Inpatient Glycemic Control Team Team Pager: [phone] (8a-5p)

## 2017-06-30 NOTE — Discharge Summary (Signed)
[facility] - [place] at [facility]   PATIENT NAME: Trevor Brown    MR#:  [id number]  DATE OF BIRTH:  08/24/1990  DATE OF ADMISSION:  06/29/2017 ADMITTING PHYSICIAN: Marguarite Arbour, MD  DATE OF DISCHARGE: 06/30/2017  PRIMARY CARE PHYSICIAN: Patient, No Pcp Per    ADMISSION DIAGNOSIS:  Hyperglycemia [R73.9] Noncompliance with medications [Z91.14] Diabetic ketoacidosis without coma associated with type 1 diabetes mellitus (HCC) [E10.10]  DISCHARGE DIAGNOSIS:  DKA in type 1, recurrent due to NONCOMPLIANCE  SECONDARY DIAGNOSIS:   Past Medical History:  Diagnosis Date  . Diabetes mellitus without complication (HCC)   . Heart attack (HCC)    Pt claims he had heart attack a year ago ( 2015)- and was admitted in [facility] for that, but not given any meds or angiogram, on review of chart- I could not find any details like that.  . Hepatitis C, acute may 2016  . Hypertension     HOSPITAL COURSE:   Trevor Brown is a 26 y.o. male has a past medical history significant for Dm, HTN, tobacco use, and non-compliance who presents to ER with abdominal pain. In ER, pt found to be in DKA with sugar >500. He is now admitted. No fever. Denies CP or SOB. No N/V/D.  1.  DKA, recurrent in type I secondary to medication noncompliance -Patient has been frequently admitted pretty much every month to the hospital with similar complaints -He was placed in the ICU received IV insulin drip, IV fluids, electrolytes replaced.  Patient now on insulin 70/30 28 units in the morning and 22 units at bedtime. -Patient [facility] and [facility] before he runs out from [facility]  2.  Dehydration resolved with IV fluids  3.  Hyponatremia due to elevated sugars  4.  Tobacco abuse advised smoking cessation  We will discharge patient to home Patient advised [facility] and CONSULTS OBTAINED:    DRUG ALLERGIES:    Allergies  Allergen Reactions  . Bee Venom Anaphylaxis  . Penicillins Anaphylaxis, Hives and Other (See Comments)    Has patient had a PCN reaction causing immediate rash, facial/tongue/throat swelling, SOB or lightheadedness with hypotension: Yes Has patient had a PCN reaction causing severe rash involving mucus membranes or skin necrosis: No Has patient had a PCN reaction that required hospitalization No Has patient had a PCN reaction occurring within the last 10 years: Yes If all of the above answers are "NO", then may proceed with Cephalosporin use.  . Ibuprofen Other (See Comments)    Other reaction(s): Other (See Comments) Stomach upset Reaction:  GI upset   . Tramadol Hives and Swelling  . Vancomycin Rash and Other (See Comments)    Reaction:  Red man's syndrome     DISCHARGE MEDICATIONS:   Allergies as of 06/30/2017      Reactions   Bee Venom Anaphylaxis   Penicillins Anaphylaxis, Hives, Other (See Comments)   Has patient had a PCN reaction causing immediate rash, facial/tongue/throat swelling, SOB or lightheadedness with hypotension: Yes Has patient had a PCN reaction causing severe rash involving mucus membranes or skin necrosis: No Has patient had a PCN reaction that required hospitalization No Has patient had a PCN reaction occurring within the last 10 years: Yes If all of the above answers are "NO", then may proceed with Cephalosporin use.   Ibuprofen Other (See Comments)   Other reaction(s): Other (See Comments) Stomach upset  Reaction:  GI upset    Tramadol Hives, Swelling   Vancomycin Rash, Other (See Comments)   Reaction:  Red man's syndrome       Medication List    TAKE these medications   insulin aspart 100 UNIT/ML injection Commonly known as:  NOVOLOG INJECT 5 UNITS BEFORE MEALS IF SUGAR IS 120 - 250. INJECT 8 UNITS IF 251 -350 AND INJECT 10 UNITS IF SUGAR IS GREATER THAN 351.   insulin aspart protamine- aspart (70-30) 100 UNIT/ML  injection Commonly known as:  NOVOLOG MIX 70/30 28 units subcutaneous injection with breakfast,  22 units subcutaneous injection with dinner What changed:  additional instructions   nicotine 21 mg/24hr patch Commonly known as:  NICODERM CQ - dosed in mg/24 hours Place 1 patch (21 mg total) onto the skin daily.       If you experience worsening of your admission symptoms, develop shortness of breath, life threatening emergency, suicidal or homicidal thoughts you must seek medical attention immediately by calling 911 or calling your MD immediately  if symptoms less severe.  You Must read complete instructions/literature along with all the possible adverse reactions/side effects for all the Medicines you take and that have been prescribed to you. Take any new Medicines after you have completely understood and accept all the possible adverse reactions/side effects.   Please note  You were cared for by a hospitalist during your hospital stay. If you have any questions about your discharge medications or the care you received while you were in the hospital after you are discharged, you can call the unit and asked to speak with the hospitalist on call if the hospitalist that took care of you is not available. Once you are discharged, your primary care physician will handle any further medical issues. Please note that NO REFILLS for any discharge medications will be authorized once you are discharged, as it is imperative that you return to your primary care physician (or establish a relationship with a primary care physician if you do not have one) for your aftercare needs so that they can reassess your need for medications and monitor your lab values. Today   SUBJECTIVE  Doing ok   VITAL SIGNS:  Blood pressure 122/73, pulse 78, temperature 98.4 F (36.9 C), temperature source Oral, resp. rate 16, height 5\' 4"  (1.626 m), weight 54.6 kg (120 lb 6.4 oz), SpO2 99 %.  I/O:    Intake/Output  Summary (Last 24 hours) at 06/30/2017 0931 Last data filed at 06/30/2017 1610 Gross per 24 hour  Intake 868.69 ml  Output 2250 ml  Net -1381.31 ml    PHYSICAL EXAMINATION:  GENERAL:  26 y.o.-year-old patient lying in the bed with no acute distress. Thin, cachectic  EYES: Pupils equal, round, reactive to light and accommodation. No scleral icterus. Extraocular muscles intact.  HEENT: Head atraumatic, normocephalic. Oropharynx and nasopharynx clear. Poor dentition!! NECK:  Supple, no jugular venous distention. No thyroid enlargement, no tenderness.  LUNGS: Normal breath sounds bilaterally, no wheezing, rales,rhonchi or crepitation. No use of accessory muscles of respiration.  CARDIOVASCULAR: S1, S2 normal. No murmurs, rubs, or gallops.  ABDOMEN: Soft, non-tender, non-distended. Bowel sounds present. No organomegaly or mass.  EXTREMITIES: No pedal edema, cyanosis, or clubbing.  NEUROLOGIC: Cranial nerves II through XII are intact. Muscle strength 5/5 in all extremities. Sensation intact. Gait not checked.  PSYCHIATRIC: The patient is alert and oriented x 3.  SKIN: No obvious rash, lesion, or ulcer.   DATA REVIEW:   CBC  Recent Labs  Lab 06/30/17 0325  WBC 10.6  HGB 12.7*  HCT 38.3*  PLT 268    Chemistries  Recent Labs  Lab 06/29/17 1944 06/30/17 0325  NA 135 130*  K 4.1 4.7  CL 105 100*  CO2 23 20*  GLUCOSE 121* 529*  BUN 9 12  CREATININE 0.64 0.69  CALCIUM 8.3* 8.1*  MG 2.1  --   AST  --  182*  ALT  --  138*  ALKPHOS  --  209*  BILITOT  --  0.8    Microbiology Results   Recent Results (from the past 240 hour(s))  MRSA PCR Screening     Status: None   Collection Time: 06/29/17  1:52 PM  Result Value Ref Range Status   MRSA by PCR NEGATIVE NEGATIVE Final    Comment:        The GeneXpert MRSA Assay (FDA approved for NASAL specimens only), is one component of a comprehensive MRSA colonization surveillance program. It is not intended to diagnose  MRSA infection nor to guide or monitor treatment for MRSA infections.     RADIOLOGY:  No results found.   Management plans discussed with the patient, family and they are in agreement.  CODE STATUS:     Code Status Orders  (From admission, onward)        Start     Ordered   06/29/17 1355  Full code  Continuous     06/29/17 1354    Code Status History    Date Active Date Inactive Code Status Order ID Comments User Context   06/05/2017 04:56 06/07/2017 20:52 Full Code [id number]  Arnaldo Nataliamond, Michael S, MD Inpatient   06/04/2017 02:59 06/05/2017 04:56 Full Code [id number]  Ihor AustinPyreddy, Pavan, MD Inpatient   04/17/2017 10:55 04/21/2017 15:59 Full Code [id number]  Ramonita LabGouru, Aruna, MD Inpatient   02/03/2017 13:36 02/04/2017 21:28 Full Code [id number]  Gracelyn NurseJohnston, John D, MD ED   10/13/2016 05:26 10/15/2016 18:12 Full Code [id number]  Arnaldo Nataliamond, Michael S, MD Inpatient   09/08/2016 03:52 09/08/2016 18:37 Full Code [id number]  Tonye RoyaltyHugelmeyer, Alexis, DO Inpatient   08/12/2016 22:20 08/13/2016 17:10 Full Code [id number]  Katharina CaperVaickute, Rima, MD Inpatient   07/04/2016 12:24 07/05/2016 19:36 Full Code [id number]  Altamese DillingVachhani, Vaibhavkumar, MD Inpatient   07/04/2016 12:24 07/04/2016 12:24 Full Code [id number]  Altamese DillingVachhani, Vaibhavkumar, MD Inpatient   04/09/2016 16:54 04/11/2016 14:09 Full Code [id number]  Katharina CaperVaickute, Rima, MD Inpatient   03/26/2016 01:18 03/27/2016 19:11 Full Code [id number]  Tonye RoyaltyHugelmeyer, Alexis, DO Inpatient   03/18/2016 08:15 03/21/2016 20:35 Full Code [id number]  Wyatt HasteHower, David K, MD ED   03/12/2016 12:20 03/16/2016 19:27 Full Code [id number]  Enedina FinnerPatel, Breven Guidroz, MD Inpatient   02/18/2016 16:59 02/19/2016 17:18 Full Code [id number]  Ramonita LabGouru, Aruna, MD ED   01/12/2016 08:58 01/15/2016 19:33 Full Code [id number]  Milagros LollSudini, Srikar, MD ED   01/11/2016 00:35 01/11/2016 23:20 Full Code [id number]  Gery Prayrosley, Debby, MD Inpatient   12/12/2015 22:04 12/14/2015 18:38 Full Code [id number]  Katharina CaperVaickute, Rima, MD Inpatient   10/23/2015 17:42 10/25/2015 19:04 Full Code  [id number]  Katha HammingKonidena, Snehalatha, MD ED   06/10/2015 08:20 06/12/2015 14:41 Full Code [id number]  Arnaldo Nataliamond, Michael S, MD Inpatient   06/06/2015 20:43 06/07/2015 16:20 Full Code [id number]  Enedina FinnerPatel, Dulce Martian, MD Inpatient   04/07/2015 12:45 04/08/2015 20:37 Full Code [id number]  Altamese DillingVachhani, Vaibhavkumar, MD Inpatient   12/09/2014 14:44 12/13/2014 16:16 Full Code [id number]  Gale JourneyWalsh, Catherine P, MD Inpatient      TOTAL TIME TAKING CARE OF THIS PATIENT:  40 minutes.    Enedina FinnerSona Asaiah Hunnicutt M.D on 06/30/2017 at 9:31 AM  Between 7am to 6pm - Pager - [phone] After 6pm go to [email or web address] - password [email or web address]  [facility]  Office  [phone]  CC: Primary care physician; Patient, No Pcp Per

## 2017-06-30 NOTE — Care Management (Signed)
Patient ran out of his insulin and admitted for DKA. Rece[facility] from [facility].  At present, the clinic is closed due to weather. Investigating as to whether there are plans to open.  If patient discharges today, he says he will also require assist with transportation home

## 2017-06-30 NOTE — Plan of Care (Signed)
Patient is progressing and blood sugars are within normal range.  Arturo Mortonlay, Amritpal Shropshire N

## 2017-06-30 NOTE — Progress Notes (Signed)
Called report to [facility] on 2C, pt. Transferred in wheelchair on RA, IV's saline locked. No care concerns at this time

## 2017-06-30 NOTE — Progress Notes (Signed)
Called Dr. Tobi BastosPyreddy regarding blood sugar of 451.  Orders were placed for 14 units of novolog one time.  Arturo MortonClay, Jany Buckwalter N  06/30/2017  4:54 AM

## 2017-06-30 NOTE — Care Management (Signed)
CSW has arranged transportation home through [employer] [facility]     Patient to discharge on Novolog 70/30 and Novolog.  Due to [facility] being closed.  [facility] an emergency supply for discharge.  Rx has been faxed.  Per Nate at outpatient pharmacy, supply for both will be sent to inpatient pharmacy, and sent up to floor.  Patient will be sent home with insulin syringes.  RNCM confirmed with patient that he does have his glucometer, and insulin syringes in the home.  Patient states that he has still not pursued getting connected with [facility], but states "I'm going to work on it"

## 2017-07-01 LAB — BLOOD GAS, VENOUS
Patient temperature: 37
pCO2, Ven: 37 mmHg — ABNORMAL LOW (ref 44.0–60.0)
pH, Ven: 7.28 (ref 7.250–7.430)

## 2017-07-10 ENCOUNTER — Telehealth: Payer: Self-pay | Admitting: Pharmacist

## 2017-07-10 NOTE — Telephone Encounter (Signed)
07/10/2017 Faxed Novo Nordisk refill request for [employer] Inject 5 units subcutaneously before meals if sugar is 120-50, inject 8 units if sugar is 251-350, Inject 10 units if sugar is >351, # 11 --Max daily dose 90 units   Novolog Mix 70/30 Inject 42 units subcutaneously witn breakfast, inject 34 units with dinner, # 9 -- Max daily dose 76 units.Forde RadonAJ

## 2017-07-18 ENCOUNTER — Other Ambulatory Visit: Payer: Self-pay | Admitting: Internal Medicine

## 2017-08-06 ENCOUNTER — Encounter: Payer: Self-pay | Admitting: Emergency Medicine

## 2017-08-06 ENCOUNTER — Other Ambulatory Visit: Payer: Self-pay | Admitting: Internal Medicine

## 2017-08-06 ENCOUNTER — Emergency Department
Admission: EM | Admit: 2017-08-06 | Discharge: 2017-08-06 | Disposition: A | Discharge status: Home / Self Care | Payer: Self-pay | Attending: Emergency Medicine | Admitting: Emergency Medicine

## 2017-08-06 DIAGNOSIS — I252 Old myocardial infarction: Secondary | ICD-10-CM | POA: Insufficient documentation

## 2017-08-06 DIAGNOSIS — K029 Dental caries, unspecified: Secondary | ICD-10-CM | POA: Insufficient documentation

## 2017-08-06 DIAGNOSIS — I10 Essential (primary) hypertension: Secondary | ICD-10-CM | POA: Insufficient documentation

## 2017-08-06 DIAGNOSIS — K047 Periapical abscess without sinus: Secondary | ICD-10-CM | POA: Insufficient documentation

## 2017-08-06 DIAGNOSIS — Z794 Long term (current) use of insulin: Secondary | ICD-10-CM | POA: Insufficient documentation

## 2017-08-06 DIAGNOSIS — Z79899 Other long term (current) drug therapy: Secondary | ICD-10-CM | POA: Insufficient documentation

## 2017-08-06 DIAGNOSIS — E109 Type 1 diabetes mellitus without complications: Secondary | ICD-10-CM | POA: Insufficient documentation

## 2017-08-06 DIAGNOSIS — F17210 Nicotine dependence, cigarettes, uncomplicated: Secondary | ICD-10-CM | POA: Insufficient documentation

## 2017-08-06 MED ORDER — CLINDAMYCIN HCL 150 MG PO CAPS: ORAL_CAPSULE | ORAL | 0 refills | Status: DC

## 2017-08-06 MED ORDER — CLINDAMYCIN PHOSPHATE 600 MG/4ML IJ SOLN
600.0000 mg | Freq: Once | INTRAMUSCULAR | Status: AC
  Administered 2017-08-06: 600 mg via INTRAMUSCULAR
  Filled 2017-08-06: qty 4

## 2017-08-06 MED ORDER — CLINDAMYCIN PHOSPHATE 300 MG/2ML IJ SOLN
INTRAMUSCULAR | Status: AC
  Administered 2017-08-06: 600 mg via INTRAMUSCULAR
  Filled 2017-08-06: qty 4

## 2017-08-06 NOTE — ED Triage Notes (Signed)
Pt to ED via POV with c/o dental pain to LFT side xfew days , swelling noted. Pt denies fever. Pt in NAD at this time

## 2017-08-06 NOTE — ED Notes (Signed)
Patient here with 2 day hx of dental pain.  Left side of face swollen.  Denies any difficulty breathing.  Alert and oriented.

## 2017-08-06 NOTE — ED Provider Notes (Signed)
[facility] Emergency Department Provider Note  ____________________________________________   First MD Initiated Contact with Patient 08/06/17 [phone]     (approximate)  I have reviewed the triage vital signs and the nursing notes.   HISTORY  Chief Complaint Dental Pain   HPI Trevor Brown is a 27 y.o. male complaint dental pain.  Patient states that his tooth been hurting for several days and today the facial swelling on the left is much worse.  He is unaware of any fever or chills.  Patient does not have a dentist.  He has not been taking over-the-counter medication.  He rates his pain as 10/10.  Past Medical History:  Diagnosis Date  . Diabetes mellitus without complication (HCC)   . Heart attack (HCC)    Pt claims he had heart attack a year ago ( 2015)- and was admitted in [facility] for that, but not given any meds or angiogram, on review of chart- I could not find any details like that.  . Hepatitis C, acute may 2016  . Hypertension     Patient Active Problem List   Diagnosis Date Noted  . Abdominal pain 06/29/2017  . Hyponatremia 06/29/2017  . DKA (diabetic ketoacidosis) (HCC) 06/29/2017  . Dental erosion extending into pulp 12/31/2016  . 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 Elevated transaminase level 03/27/2016  . Diabetes mellitus type 1 (HCC) 03/27/2016  . Tobacco abuse counseling 03/27/2016  . Transaminitis 03/25/2016  . Tobacco abuse 01/10/2016  . Type 1 diabetes mellitus with hyperglycemia (HCC) 12/12/2015  . Cocaine abuse (HCC) 12/12/2015  . DKA (diabetic ketoacidoses) (HCC) 06/10/2015  . Hidradenitis suppurativa of left axilla   . DKA, type 1 (HCC) 06/06/2015  . Malnutrition of moderate degree (HCC) 04/08/2015  . Hepatitis C 12/13/2014  . Diabetes type 1, uncontrolled (HCC) 12/11/2014  . Major depressive disorder, single episode, mild (HCC)   . Major depression, single episode 12/10/2014  . Diabetes mellitus type 1, uncontrolled (HCC)  12/09/2014  . Homelessness 12/09/2014    Past Surgical History:  Procedure Laterality Date  . none      Prior to Admission medications   Medication Sig Start Date End Date Taking? Authorizing Provider  clindamycin (CLEOCIN) 150 MG capsule Take 1 capsule tid 08/06/17   Bridget Hartshorn L, PA-C  insulin aspart (NOVOLOG) 100 UNIT/ML injection INJECT 5 UNITS UNDER THE SKIN BEFORE MEALS IF SUGAR IS 120 - 250. INJECT 8 UNITS IF 251 -350 AND INJECT 10 UNITS IF SUGAR IS GREATER THAN 35 08/06/17   Virl Axe, MD  insulin aspart protamine- aspart (NOVOLOG MIX 70/30) (70-30) 100 UNIT/ML injection 28 units subcutaneous injection with breakfast,  22 units subcutaneous injection with dinner 06/30/17   Enedina Finner, MD  nicotine (NICODERM CQ - DOSED IN MG/24 HOURS) 21 mg/24hr patch Place 1 patch (21 mg total) onto the skin daily. 04/21/17   Alford Highland, MD    Allergies Bee venom; Penicillins; Ibuprofen; Tramadol; and Vancomycin  Family History  Problem Relation Age of Onset  . Cirrhosis Mother   . Diabetes Mellitus II Maternal Grandmother   . Diabetes Mellitus II Maternal Grandfather     Social History Social History   Tobacco Use  . Smoking status: Current Every Day Smoker    Packs/day: 2.00    Types: Cigarettes  . Smokeless tobacco: Never Used  Substance Use Topics  . Alcohol use: No    Alcohol/week: 0.0 oz  . 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 ENT: Positive dental pain. Cardiovascular: Denies chest pain. Respiratory: Denies shortness of breath. ____________________________________________   PHYSICAL EXAM:  VITAL SIGNS: ED Triage Vitals  Enc Vitals Group     BP      Pulse      Resp      Temp      Temp src      SpO2      Weight      Height      Head Circumference      Peak Flow      Pain Score      Pain Loc      Pain Edu?      Excl. in GC?     Constitutional: Alert and oriented. Well appearing and in no acute distress. Eyes: Conjunctivae are normal.  Head: Atraumatic. Nose: No congestion/rhinnorhea. Mouth/Throat: Mucous membranes are moist.  Oropharynx non-erythematous.  Chronically decayed teeth both upper and lower with moderate gum edema.  Moderate tenderness on palpation.  There is left-sided facial edema.  No obvious drainage or abscess seen. Neck: No stridor.   Cardiovascular: Normal rate, regular rhythm. Grossly normal heart sounds.  Good peripheral circulation. Respiratory: Normal respiratory effort.  No retractions. Lungs CTAB. Musculoskeletal: Moves upper and lower extremities that is difficulty normal gait was noted. Neurologic:  Normal speech and language. No gross focal neurologic deficits are appreciated.  Skin:  Skin is warm, dry and intact. No rash noted. Psychiatric: Mood and affect are normal. Speech and behavior are normal.  ____________________________________________   LABS (all labs ordered are listed, but only abnormal results are displayed)  Labs Reviewed - No data to display   PROCEDURES  Procedure(s) performed: None  Procedures  Critical Care performed: No  ____________________________________________   INITIAL IMPRESSION / ASSESSMENT AND PLAN / ED COURSE  As part of my medical decision making, I reviewed the following data within the electronic MEDICAL RECORD NUMBER Notes from prior ED visits and [place] Controlled Substance Database  Patient was given clindamycin 600 mg IM.  Patient was discharged with a prescription for clindamycin to continue for the next 7 days.  He was given a list of dental clinics in the area that charged based on his income.  He is strongly advised to follow-up with 1 of these dental clinics.  ____________________________________________   FINAL CLINICAL IMPRESSION(S) / ED DIAGNOSES  Final diagnoses:  Dental abscess  Pain due to dental caries     ED Discharge  Orders        Ordered    clindamycin (CLEOCIN) 150 MG capsule     08/06/17 0941       Note:  This document was prepared using Dragon voice recognition software and may include unintentional dictation errors.    Tommi RumpsSummers, Shoni Quijas L, PA-C 08/06/17 1250    Jeanmarie PlantMcShane, James A, MD 08/06/17 1515

## 2017-08-06 NOTE — Discharge Instructions (Signed)
Begin taking antibiotics as directed.  Today the cheapest place in town is [employer].  Use your coupon that was printed for you. Also follow-up of one the dental clinics listed on your discharge papers.  These dental clinics charge based on your income.  Also [facility] takes walk-in patients.  OPTIONS FOR DENTAL FOLLOW UP CARE  [place] Department of Health and Human Services - Local Safety Net Dental Clinics TripDoors.comhttp://www.ncdhhs.gov/dph/oralhealth/services/safetynetclinics.htm   [facility] [phone])  [facility] [phone])  [facility] [phone]  [facility] [phone])  [facility] [phone]) This clinic caters to the indigent population and is on a lottery system. Location: [employer] [employer] [facility] [address] [facility], [address], [place] [facility] Hours: Wednesdays from 6pm - 9pm, patients seen by a lottery system. For dates, call or go to [email or web address] Services: Cleanings, fillings and simple extractions. Payment Options: DENTAL WORK IS FREE OF CHARGE. Bring proof of income or support. Best way to get seen: Arrive at 5:15 pm - this is a lottery, NOT first come/first serve, so arriving earlier will not increase your chances of being seen.     [facility] [phone] Select option 1 for emergencies   Location: [facility], [facility], [address], [place] [facility] Hours: No walk-ins accepted - call the day before to schedule an appointment. Check in times are 9:30 am and 1:30 pm. Services: Simple extractions, temporary fillings, pulpectomy/pulp debridement, uncomplicated abscess drainage. Payment Options: PAYMENT IS DUE AT THE TIME OF SERVICE.  Fee is usually $100-200, additional surgical procedures (e.g. abscess drainage) may be extra. Cash, checks, Visa/MasterCard accepted.  Can file Medicaid if patient is  covered for dental - patient should call case worker to check. No discount for [facility] patients. Best way to get seen: MUST call the day before and get onto the schedule. Can usually be seen the next 1-2 days. No walk-ins accepted.     [facility] [phone]   Location: [facility], [address], [place] [facility] Hours: M, W, Th, F 8am or 1:30pm, Tues 9a or 1:30 - first come/first served. Services: Simple extractions, temporary fillings, uncomplicated abscess drainage.  You do not need to be an [place] resident. Payment Options: PAYMENT IS DUE AT THE TIME OF SERVICE. Dental insurance, otherwise sliding scale - bring proof of income or support. Depending on income and treatment needed, cost is usually $50-200. Best way to get seen: Arrive early as it is first come/first served.     [facility] [phone]   Location: [address] Clinic Hours: Mon-Thu 8a-5p Services: Most basic dental services including extractions and fillings. Payment Options: PAYMENT IS DUE AT THE TIME OF SERVICE. Sliding scale, up to 50% off - bring proof if income or support. Medicaid with dental option accepted. Best way to get seen: Call to schedule an appointment, can usually be seen within 2 weeks OR they will try to see walk-ins - show up at 8a or 2p (you may have to wait).     [facility] [phone] [place] RESIDENTS ONLY   Location: [facility], [address], [place], [place] [address] Clinic Hours: By appointment only. Monday - Thursday 8am-5pm, Friday 8am-12pm Services: Cleanings, fillings, extractions. Payment Options: PAYMENT IS DUE AT THE TIME OF SERVICE. Cash, Visa or MasterCard. Sliding scale - $30 minimum per service. Best way to get  seen: Come in to office, complete packet and make an appointment - need proof of income or support  monies for each household member and proof of [place] residence. Usually takes about a month to get in.     [facility] [phone]   Location: [address] [facility] Hours: Walk-in Urgent Care Dental Services are offered Monday-Friday mornings only. The numbers of emergencies accepted daily is limited to the number of providers available. Maximum 15 - Mondays, Wednesdays & Thursdays Maximum 10 - Tuesdays & Fridays Services: You do not need to be a [place] resident to be seen for a dental emergency. Emergencies are defined as pain, swelling, abnormal bleeding, or dental trauma. Walkins will receive x-rays if needed. NOTE: Dental cleaning is not an emergency. Payment Options: PAYMENT IS DUE AT THE TIME OF SERVICE. Minimum co-pay is $40.00 for uninsured patients. Minimum co-pay is $3.00 for Medicaid with dental coverage. Dental Insurance is accepted and must be presented at time of visit. Medicare does not cover dental. Forms of payment: Cash, credit card, checks. Best way to get seen: If not previously registered with the clinic, walk-in dental registration begins at 7:15 am and is on a first come/first serve basis. If previously registered with the clinic, call to make an appointment.     [facility] [phone] [place] RESIDENTS ONLY   Location: [address], [place], [place] Clinic Hours: Mon-Thu 10a-2p Services: Extractions only! Payment Options: FREE (donations accepted) - bring proof of income or support Best way to get seen: Call and schedule an appointment OR come at 8am on the 1st Monday of every month (except for holidays) when it is first come/first served.     [facility] [phone]   Location: [address], [place] Clinic Hours: Friday mornings Services, Payment Options, Best way to get seen: Call for info

## 2017-08-06 NOTE — ED Notes (Signed)
Pt in NAD at time of d/c, pt ambulatory, VS stable. Pt verbalizes d/c understanding, RX and follow up

## 2017-09-24 ENCOUNTER — Telehealth: Payer: Self-pay | Admitting: Pharmacy Technician

## 2017-09-24 NOTE — Telephone Encounter (Signed)
Received updated proof of income.  Patient eligible to receive medication assistance at [facility] through 2019, as long as eligibility requirements continue to be met.  [facility]

## 2017-09-30 ENCOUNTER — Encounter: Payer: Self-pay | Admitting: Emergency Medicine

## 2017-09-30 ENCOUNTER — Emergency Department
Admission: EM | Admit: 2017-09-30 | Discharge: 2017-09-30 | Disposition: A | Discharge status: Home / Self Care | Payer: Self-pay | Attending: Emergency Medicine | Admitting: Emergency Medicine

## 2017-09-30 DIAGNOSIS — Z794 Long term (current) use of insulin: Secondary | ICD-10-CM | POA: Insufficient documentation

## 2017-09-30 DIAGNOSIS — E109 Type 1 diabetes mellitus without complications: Secondary | ICD-10-CM | POA: Insufficient documentation

## 2017-09-30 DIAGNOSIS — I252 Old myocardial infarction: Secondary | ICD-10-CM | POA: Insufficient documentation

## 2017-09-30 DIAGNOSIS — K047 Periapical abscess without sinus: Secondary | ICD-10-CM | POA: Insufficient documentation

## 2017-09-30 DIAGNOSIS — F17210 Nicotine dependence, cigarettes, uncomplicated: Secondary | ICD-10-CM | POA: Insufficient documentation

## 2017-09-30 DIAGNOSIS — I10 Essential (primary) hypertension: Secondary | ICD-10-CM | POA: Insufficient documentation

## 2017-09-30 MED ORDER — CLINDAMYCIN HCL 150 MG PO CAPS: 300.0000 mg | ORAL_CAPSULE | Freq: Three times a day (TID) | ORAL | 0 refills | Status: DC

## 2017-09-30 NOTE — ED Provider Notes (Signed)
[facility] Emergency Department Provider Note  ____________________________________________   First MD Initiated Contact with Patient 09/30/17 1424     (approximate)  I have reviewed the triage vital signs and the nursing notes.   HISTORY  Chief Complaint Abscess    HPI Trevor Brown is a 27 y.o. male who presents to the emergency department complaining of a dental abscess.  Pain started 2 days ago and he woke up with a great deal of swelling this morning.  He states [facility] but just has not gone down there.  He denies any fever or chills.  He denies chest pain or shortness of breath.  However he would like a lunch tray  Past Medical History:  Diagnosis Date  . Diabetes mellitus without complication (HCC)   . Heart attack (HCC)    Pt claims he had heart attack a year ago ( 2015)- and [facility], but not given any meds or angiogram, on review of chart- I could not find any details like that.  . Hepatitis C, acute may 2016  . Hypertension     Patient Active Problem List   Diagnosis Date Noted  . Abdominal pain 06/29/2017  . Hyponatremia 06/29/2017  . DKA (diabetic ketoacidosis) (HCC) 06/29/2017  . Dental erosion extending into pulp 12/31/2016  . 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 Elevated transaminase level 03/27/2016  . Diabetes mellitus type 1 (HCC) 03/27/2016  . Tobacco abuse counseling 03/27/2016  . Transaminitis 03/25/2016  . Tobacco abuse 01/10/2016  . Type 1 diabetes mellitus with hyperglycemia (HCC) 12/12/2015  . Cocaine abuse (HCC) 12/12/2015  . DKA (diabetic ketoacidoses) (HCC) 06/10/2015  . Hidradenitis suppurativa of left axilla   . DKA, type 1 (HCC) 06/06/2015  . Malnutrition of moderate degree (HCC) 04/08/2015  . Hepatitis C 12/13/2014  . Diabetes type 1, uncontrolled (HCC) 12/11/2014  . Major depressive disorder, single episode, mild (HCC)   . Major depression, single episode  12/10/2014  . Diabetes mellitus type 1, uncontrolled (HCC) 12/09/2014  . Homelessness 12/09/2014    Past Surgical History:  Procedure Laterality Date  . none      Prior to Admission medications   Medication Sig Start Date End Date Taking? Authorizing Provider  clindamycin (CLEOCIN) 150 MG capsule Take 2 capsules (300 mg total) by mouth 3 (three) times daily. 09/30/17   Louvina Cleary, Roselyn BeringSusan W, PA-C  insulin aspart (NOVOLOG) 100 UNIT/ML injection INJECT 5 UNITS UNDER THE SKIN BEFORE MEALS IF SUGAR IS 120 - 250. INJECT 8 UNITS IF 251 -350 AND INJECT 10 UNITS IF SUGAR IS GREATER THAN 35 08/06/17   Virl Axehaplin, Don C, MD  insulin aspart protamine- aspart (NOVOLOG MIX 70/30) (70-30) 100 UNIT/ML injection 28 units subcutaneous injection with breakfast,  22 units subcutaneous injection with dinner 06/30/17   Enedina FinnerPatel, Sona, MD  nicotine (NICODERM CQ - DOSED IN MG/24 HOURS) 21 mg/24hr patch Place 1 patch (21 mg total) onto the skin daily. 04/21/17   Alford HighlandWieting, Richard, MD    Allergies Bee venom; Penicillins; Ibuprofen; Tramadol; and Vancomycin  Family History  Problem Relation Age of Onset  . Cirrhosis Mother   . Diabetes Mellitus II Maternal Grandmother   . Diabetes Mellitus II Maternal Grandfather     Social History Social History   Tobacco Use  . Smoking status: Current Every Day Smoker    Packs/day: 2.00    Types: Cigarettes  . Smokeless tobacco: Never Used  Substance Use Topics  . Alcohol use: No    Alcohol/week: 0.0 oz  . 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 ENT: No sore throat.  Positive for dental pain and left-sided facial swelling Respiratory: Denies cough Genitourinary: Negative for dysuria. Musculoskeletal: Negative for back pain. Skin: Negative for rash.    ____________________________________________   PHYSICAL EXAM:  VITAL SIGNS: ED Triage Vitals [09/30/17  1355]  Enc Vitals Group     BP 127/77     Pulse Rate 64     Resp 16     Temp 98.3 F (36.8 C)     Temp Source Oral     SpO2 98 %     Weight 130 lb (59 kg)     Height 5\' 4"  (1.626 m)     Head Circumference      Peak Flow      Pain Score 10     Pain Loc      Pain Edu?      Excl. in GC?     Constitutional: Alert and oriented. Well appearing and in no acute distress. Eyes: Conjunctivae are normal.  Head: Atraumatic.  Positive for swelling along the left maxillary area Nose: No congestion/rhinnorhea. Mouth/Throat: Mucous membranes are moist.  Positive for very poor dentition with teeth that are broken off at the gumline Neck: Supple, no lymphadenopathy is noted Cardiovascular: Normal rate, regular rhythm.  Heart sounds are normal Respiratory: Normal respiratory effort.  No retractions, lungs clear to auscultation GU: deferred Musculoskeletal: FROM all extremities, warm and well perfused Neurologic:  Normal speech and language.  Skin:  Skin is warm, dry and intact. No rash noted. Psychiatric: Mood and affect are normal. Speech and behavior are normal.  ____________________________________________   LABS (all labs ordered are listed, but only abnormal results are displayed)  Labs Reviewed - No data to display ____________________________________________   ____________________________________________  RADIOLOGY    ____________________________________________   PROCEDURES  Procedure(s) performed: No  Procedures    ____________________________________________   INITIAL IMPRESSION / ASSESSMENT AND PLAN / ED COURSE  Pertinent labs & imaging results that were available during my care of the patient were reviewed by me and considered in my medical decision making (see chart for details).  Patient is 27 year old male complaining of left-sided facial swelling with dental pain.  On physical exam there is a good deal of swelling along the left maxillary area.  The  patient has poor dentition and most teeth are broken off at the gumline  Patient was given a prescription for clindamycin.  He is to follow-up with [facility] as he had been previously instructed to do.  If he is worsening he should return to the emergency department.  Patient states he understands and he was discharged in stable condition     As part of my medical decision making, I reviewed the following data within the electronic MEDICAL RECORD NUMBER Nursing notes reviewed and incorporated, Notes from prior ED visits and [place]rolled [place]  ____________________________________________   FINAL CLINICAL IMPRESSION(S) / ED DIAGNOSES  Final diagnoses:  Dental abscess      NEW MEDICATIONS STARTED DURING THIS VISIT:  Discharge Medication List as of 09/30/2017  2:44 PM       Note:  This document was prepared using Dragon voice recognition software and may include unintentional dictation errors.  Faythe Ghee, PA-C 09/30/17 1700    Emily Filbert, MD 10/05/17 0700

## 2017-09-30 NOTE — Discharge Instructions (Signed)
OPTIONS FOR DENTAL FOLLOW UP CARE ° °[place] Department of Health and Human Services - Local Safety Net Dental Clinics °http://www.ncdhhs.gov/dph/oralhealth/services/safetynetclinics.htm °  °[facility] ([phone]) ° °[facility] ([phone]) ° °[facility] ([phone]) ° ° [facility] ([phone]) ° °[facility] ([phone]) °This clinic caters to the indigent population and is on a lottery system. °Location: °[facility], [address], [place] °Clinic Hours: °Wednesdays from 6pm - 9pm, patients seen by a lottery system. °For dates, call or go to [email or web address] °Services: °Cleanings, fillings and simple extractions. °Payment Options: °DENTAL WORK IS FREE OF CHARGE. Bring proof of income or support. °Best way to get seen: °Arrive at 5:15 pm - this is a lottery, NOT first come/first serve, so arriving earlier will not increase your chances of being seen. °  °  °[facility] °[phone] °Select option 1 for emergencies °  °Location: °[facility], [address], [place] °Clinic Hours: °No walk-ins accepted - call the day before to schedule an appointment. °Check in times are 9:30 am and 1:30 pm. °Services: °Simple extractions, temporary fillings, pulpectomy/pulp debridement, uncomplicated abscess drainage. °Payment Options: °PAYMENT IS DUE AT THE TIME OF SERVICE.  Fee is usually $100-200, additional surgical procedures (e.g. abscess drainage) may be extra. °Cash, checks, Visa/MasterCard accepted.  Can file Medicaid if patient is covered for dental - patient should call case worker to check. °No discount for [facility] Care patients. °Best way to get seen: °MUST call the day before and get onto the schedule. Can usually be seen the next 1-2 days. No walk-ins accepted. °  °  °[facility] °[phone] °   °Location: °[facility], [address], [place] °Clinic Hours: °M, W, Th, F 8am or 1:30pm, Tues 9a or 1:30 - first come/first served. °Services: °Simple extractions, temporary fillings, uncomplicated abscess drainage.  You do not need to be an [place] resident. °Payment Options: °PAYMENT IS DUE AT THE TIME OF SERVICE. °Dental insurance, otherwise sliding scale - bring proof of income or support. °Depending on income and treatment needed, cost is usually $50-200. °Best way to get seen: °Arrive early as it is first come/first served. °  °  °[facility] °[phone] °  °Location: °[address] °Clinic Hours: °Mon-Thu 8a-5p °Services: °Most basic dental services including extractions and fillings. °Payment Options: °PAYMENT IS DUE AT THE TIME OF SERVICE. °Sliding scale, up to 50% off - bring proof if income or support. °Medicaid with dental option accepted. °Best way to get seen: °Call to schedule an appointment, can usually be seen within 2 weeks OR they will try to see walk-ins - show up at 8a or 2p (you may have to wait). °  °  °[facility] °[phone] °[place] RESIDENTS ONLY °  °Location: °[facility], [address], [place], [place] [address] °Clinic Hours: By appointment only. °Monday - Thursday 8am-5pm, Friday 8am-12pm °Services: Cleanings, fillings, extractions. °Payment Options: °PAYMENT IS DUE AT THE TIME OF SERVICE. °Cash, Visa or MasterCard. Sliding scale - $30 minimum per service. °Best way to get seen: °Come in to office, complete packet and make an appointment - need proof of income °or support monies for each household member and proof of [place] residence. °Usually takes about a month to get in. °  °  °[facility] °[phone] °  °Location: °[address]   [place] °Clinic Hours: Walk-in Urgent Care Dental Services are offered Monday-Friday  mornings only. °The numbers of emergencies accepted daily is limited to the number of °providers available. °Maximum 15 - Mondays, Wednesdays & Thursdays °Maximum 10 - Tuesdays & Fridays °Services: °You do not need to be a [place] resident to be seen for a dental emergency. °Emergencies are defined as pain, swelling, abnormal bleeding, or dental trauma. Walkins will receive x-rays if needed. °NOTE: Dental cleaning is not an emergency. °Payment Options: °PAYMENT IS DUE AT THE TIME OF SERVICE. °Minimum co-pay is $40.00 for uninsured patients. °Minimum co-pay is $3.00 for Medicaid with dental coverage. °Dental Insurance is accepted and must be presented at time of visit. °Medicare does not cover dental. °Forms of payment: Cash, credit card, checks. °Best way to get seen: °If not previously registered with the clinic, walk-in dental registration begins at 7:15 am and is on a first come/first serve basis. °If previously registered with the clinic, call to make an appointment. °  °  °[facility] °[phone] °[place] RESIDENTS ONLY °  °Location: °[address], [place], [place] °Clinic Hours: °Mon-Thu 10a-2p °Services: Extractions only! °Payment Options: °FREE (donations accepted) - bring proof of income or support °Best way to get seen: °Call and schedule an appointment OR come at 8am on the 1st Monday of every month (except for holidays) when it is first come/first served. °  °  °[facility] °[phone] °  °Location: °[address], [place] °Clinic Hours: °Friday mornings °Services, Payment Options, Best way to get seen: °Call for info °

## 2017-09-30 NOTE — ED Notes (Signed)
See triage note  Presents with possible dental abscess    Pain and swelling noted to left upper gumline   Pain started 2 days ago

## 2017-09-30 NOTE — ED Triage Notes (Signed)
Pt to ED with c/o of dental abscess. Pt states started approx 2 days ago.

## 2017-10-23 ENCOUNTER — Telehealth: Payer: Self-pay | Admitting: Pharmacist

## 2017-10-23 ENCOUNTER — Other Ambulatory Visit: Payer: Self-pay | Admitting: Internal Medicine

## 2017-10-23 DIAGNOSIS — E108 Type 1 diabetes mellitus with unspecified complications: Secondary | ICD-10-CM

## 2017-10-23 NOTE — Telephone Encounter (Signed)
10/23/2017 10:17:15 AM - Refills Novolog vial & Novolog mix 70/30  10/23/17 [place] Sink[facility]ita brought me a Jul 11, 2017 invoice where we received 11 Novolog vials & 10 Novolog Mix 70/30. I have received in [facility]. She is now requesting that I place refill. I have printed Novo Nordisk refill request for Novolog vials Max daily dose 90 units//Inject 5 units under the skin before meals if BS is 120-250, inject 8 units if BS 251-350, inject 10 units if BS is above 350. #11. Novolog Mix 70/30 Inject 28 units with breakfast, and 22 units with dinner, #6. Sending this refill request to [facility] for provider to sign.Forde RadonAJ

## 2017-10-29 ENCOUNTER — Other Ambulatory Visit: Payer: Self-pay | Admitting: Internal Medicine

## 2017-10-31 ENCOUNTER — Telehealth: Payer: Self-pay | Admitting: Pharmacist

## 2017-10-31 NOTE — Telephone Encounter (Signed)
10/31/2017 11:10:09 AM - refills faxed-Novolog Vial & Novolog mix 70/30  10/31/17 Faxed Novo Nordisk refill request for [employer] V[employer] Max daily dose 90 units (Inject 5 units before meals if BS-120-250, 8 units if 251-350, 10 units if > 350 #11.) Novolog Mix 70/30 Max daily dose 50 units (Inject 28 units with breakfast, Inject 22 units with dinner #6).Forde RadonAJ

## 2017-11-03 ENCOUNTER — Other Ambulatory Visit: Payer: Self-pay | Admitting: Internal Medicine

## 2017-11-03 DIAGNOSIS — E108 Type 1 diabetes mellitus with unspecified complications: Secondary | ICD-10-CM

## 2017-12-08 ENCOUNTER — Other Ambulatory Visit: Payer: Self-pay | Admitting: Internal Medicine

## 2017-12-08 DIAGNOSIS — E108 Type 1 diabetes mellitus with unspecified complications: Secondary | ICD-10-CM

## 2017-12-23 ENCOUNTER — Ambulatory Visit: Payer: Self-pay

## 2017-12-25 ENCOUNTER — Ambulatory Visit: Payer: Self-pay

## 2018-01-19 ENCOUNTER — Emergency Department: Payer: Self-pay

## 2018-01-19 ENCOUNTER — Other Ambulatory Visit: Payer: Self-pay

## 2018-01-19 ENCOUNTER — Emergency Department
Admission: EM | Admit: 2018-01-19 | Discharge: 2018-01-19 | Payer: Self-pay | Attending: Student in an Organized Health Care Education/Training Program | Admitting: Student in an Organized Health Care Education/Training Program

## 2018-01-19 ENCOUNTER — Encounter: Payer: Self-pay | Admitting: Emergency Medicine

## 2018-01-19 DIAGNOSIS — Z79899 Other long term (current) drug therapy: Secondary | ICD-10-CM | POA: Insufficient documentation

## 2018-01-19 DIAGNOSIS — I10 Essential (primary) hypertension: Secondary | ICD-10-CM | POA: Insufficient documentation

## 2018-01-19 DIAGNOSIS — M60009 Infective myositis, unspecified site: Secondary | ICD-10-CM | POA: Insufficient documentation

## 2018-01-19 DIAGNOSIS — E1010 Type 1 diabetes mellitus with ketoacidosis without coma: Secondary | ICD-10-CM | POA: Insufficient documentation

## 2018-01-19 DIAGNOSIS — F17210 Nicotine dependence, cigarettes, uncomplicated: Secondary | ICD-10-CM | POA: Insufficient documentation

## 2018-01-19 DIAGNOSIS — Z794 Long term (current) use of insulin: Secondary | ICD-10-CM | POA: Insufficient documentation

## 2018-01-19 DIAGNOSIS — E1065 Type 1 diabetes mellitus with hyperglycemia: Secondary | ICD-10-CM | POA: Insufficient documentation

## 2018-01-19 LAB — URINALYSIS, COMPLETE (UACMP) WITH MICROSCOPIC
Bacteria, UA: NONE SEEN
Bilirubin Urine: NEGATIVE
Glucose, UA: >=500 mg/dL — AB
Hgb urine dipstick: NEGATIVE
Ketones, ur: 20 mg/dL — AB
Leukocytes, UA: NEGATIVE
Nitrite: NEGATIVE
Protein, ur: NEGATIVE mg/dL
Specific Gravity, Urine: 1.025 (ref 1.005–1.030)
Squamous Epithelial / LPF: NONE SEEN (ref 0–5)
pH: 5 (ref 5.0–8.0)

## 2018-01-19 LAB — BASIC METABOLIC PANEL
Anion gap: 14 (ref 5–15)
Anion gap: 10 (ref 5–15)
BUN: 13 mg/dL (ref 6–20)
BUN: 11 mg/dL (ref 6–20)
CO2: 22 mmol/L (ref 22–32)
CO2: 21 mmol/L — ABNORMAL LOW (ref 22–32)
Calcium: 8.7 mg/dL — ABNORMAL LOW (ref 8.9–10.3)
Calcium: 8.1 mg/dL — ABNORMAL LOW (ref 8.9–10.3)
Chloride: 89 mmol/L — ABNORMAL LOW (ref 98–111)
Chloride: 101 mmol/L (ref 98–111)
Creatinine, Ser: 0.8 mg/dL (ref 0.61–1.24)
Creatinine, Ser: 0.68 mg/dL (ref 0.61–1.24)
GFR calc Af Amer: >60 mL/min (ref >60)
GFR calc Af Amer: >60 mL/min (ref >60)
GFR calc non Af Amer: >60 mL/min (ref >60)
GFR calc non Af Amer: >60 mL/min (ref >60)
Glucose, Bld: 582 mg/dL (ref 70–99)
Glucose, Bld: 356 mg/dL — ABNORMAL HIGH (ref 70–99)
Potassium: 4.3 mmol/L (ref 3.5–5.1)
Potassium: 4 mmol/L (ref 3.5–5.1)
Sodium: 125 mmol/L — ABNORMAL LOW (ref 135–145)
Sodium: 132 mmol/L — ABNORMAL LOW (ref 135–145)

## 2018-01-19 LAB — CBC
HCT: 39.3 % — ABNORMAL LOW (ref 40.0–52.0)
Hemoglobin: 13.1 g/dL (ref 13.0–18.0)
MCH: 28.4 pg (ref 26.0–34.0)
MCHC: 33.4 g/dL (ref 32.0–36.0)
MCV: 85.2 fL (ref 80.0–100.0)
Platelets: 261 10*3/uL (ref 150–440)
RBC: 4.62 MIL/uL (ref 4.40–5.90)
RDW: 15.3 % — ABNORMAL HIGH (ref 11.5–14.5)
WBC: 10.8 10*3/uL — ABNORMAL HIGH (ref 3.8–10.6)

## 2018-01-19 LAB — BLOOD GAS, VENOUS
Acid-base deficit: 2.9 mmol/L — ABNORMAL HIGH (ref 0.0–2.0)
Bicarbonate: 22.6 mmol/L (ref 20.0–28.0)
O2 Saturation: 55.6 %
Patient temperature: 37
pCO2, Ven: 41 mmHg — ABNORMAL LOW (ref 44.0–60.0)
pH, Ven: 7.35 (ref 7.250–7.430)
pO2, Ven: 31 mmHg — CL (ref 32.0–45.0)

## 2018-01-19 LAB — GLUCOSE, CAPILLARY
Glucose-Capillary: 574 mg/dL (ref 70–99)
Glucose-Capillary: 471 mg/dL — ABNORMAL HIGH (ref 70–99)
Glucose-Capillary: 402 mg/dL — ABNORMAL HIGH (ref 70–99)
Glucose-Capillary: 331 mg/dL — ABNORMAL HIGH (ref 70–99)
Glucose-Capillary: 341 mg/dL — ABNORMAL HIGH (ref 70–99)
Glucose-Capillary: 347 mg/dL — ABNORMAL HIGH (ref 70–99)
Glucose-Capillary: 232 mg/dL — ABNORMAL HIGH (ref 70–99)
Glucose-Capillary: 194 mg/dL — ABNORMAL HIGH (ref 70–99)

## 2018-01-19 LAB — COMPREHENSIVE METABOLIC PANEL
ALT: 63 U/L — ABNORMAL HIGH (ref 0–44)
AST: 70 U/L — ABNORMAL HIGH (ref 15–41)
Albumin: 3.9 g/dL (ref 3.5–5.0)
Alkaline Phosphatase: 173 U/L — ABNORMAL HIGH (ref 38–126)
Anion gap: 16 — ABNORMAL HIGH (ref 5–15)
BUN: 13 mg/dL (ref 6–20)
CO2: 21 mmol/L — ABNORMAL LOW (ref 22–32)
Calcium: 8.9 mg/dL (ref 8.9–10.3)
Chloride: 90 mmol/L — ABNORMAL LOW (ref 98–111)
Creatinine, Ser: 0.85 mg/dL (ref 0.61–1.24)
GFR calc Af Amer: >60 mL/min (ref >60)
GFR calc non Af Amer: >60 mL/min (ref >60)
Glucose, Bld: 571 mg/dL (ref 70–99)
Potassium: 4.4 mmol/L (ref 3.5–5.1)
Sodium: 127 mmol/L — ABNORMAL LOW (ref 135–145)
Total Bilirubin: 1.5 mg/dL — ABNORMAL HIGH (ref 0.3–1.2)
Total Protein: 8.4 g/dL — ABNORMAL HIGH (ref 6.5–8.1)

## 2018-01-19 LAB — TROPONIN I: Troponin I: <0.03 ng/mL (ref <0.03)

## 2018-01-19 LAB — LACTIC ACID, PLASMA: Lactic Acid, Venous: 0.9 mmol/L (ref 0.5–1.9)

## 2018-01-19 MED ORDER — VANCOMYCIN HCL IN DEXTROSE 1-5 GM/200ML-% IV SOLN
1000.0000 mg | Freq: Once | INTRAVENOUS | Status: AC
  Administered 2018-01-19: 1000 mg via INTRAVENOUS
  Filled 2018-01-19: qty 200

## 2018-01-19 MED ORDER — IOHEXOL 300 MG/ML  SOLN
75.0000 mL | Freq: Once | INTRAMUSCULAR | Status: AC | PRN
  Administered 2018-01-19: 75 mL via INTRAVENOUS

## 2018-01-19 MED ORDER — SODIUM CHLORIDE 0.9 % IV BOLUS
1000.0000 mL | Freq: Once | INTRAVENOUS | Status: AC
  Administered 2018-01-19: 1000 mL via INTRAVENOUS

## 2018-01-19 MED ORDER — INSULIN ASPART 100 UNIT/ML ~~LOC~~ SOLN
10.0000 [IU] | Freq: Once | SUBCUTANEOUS | Status: AC
  Administered 2018-01-19: 10 [IU] via INTRAVENOUS
  Filled 2018-01-19: qty 1

## 2018-01-19 MED ORDER — SODIUM CHLORIDE 0.9 % IV SOLN
2.0000 g | Freq: Once | INTRAVENOUS | Status: AC
  Administered 2018-01-19: 2 g via INTRAVENOUS
  Filled 2018-01-19: qty 2

## 2018-01-19 MED ORDER — METRONIDAZOLE IN NACL 5-0.79 MG/ML-% IV SOLN: 500.0000 mg | Freq: Three times a day (TID) | INTRAVENOUS | Status: DC

## 2018-01-19 MED ORDER — SODIUM CHLORIDE 0.9 % IV SOLN
Freq: Once | INTRAVENOUS | Status: AC
  Administered 2018-01-19: 19:00:00 via INTRAVENOUS

## 2018-01-19 MED ORDER — CLINDAMYCIN PHOSPHATE 900 MG/50ML IV SOLN
900.0000 mg | Freq: Once | INTRAVENOUS | Status: AC
  Administered 2018-01-19: 900 mg via INTRAVENOUS
  Filled 2018-01-19: qty 50

## 2018-01-19 MED ORDER — METRONIDAZOLE IN NACL 5-0.79 MG/ML-% IV SOLN
500.0000 mg | Freq: Once | INTRAVENOUS | Status: AC
  Administered 2018-01-19: 500 mg via INTRAVENOUS
  Filled 2018-01-19: qty 100

## 2018-01-19 MED ORDER — SODIUM CHLORIDE 0.9 % IV SOLN
INTRAVENOUS | Status: DC
  Administered 2018-01-19: 2.8 [IU]/h via INTRAVENOUS
  Filled 2018-01-19: qty 1

## 2018-01-19 MED ORDER — INSULIN ASPART 100 UNIT/ML ~~LOC~~ SOLN
SUBCUTANEOUS | Status: AC
  Filled 2018-01-19: qty 1

## 2018-01-19 MED ORDER — LIDOCAINE HCL (PF) 1 % IJ SOLN
5.0000 mL | Freq: Once | INTRAMUSCULAR | Status: AC
  Administered 2018-01-19: 5 mL via INTRADERMAL
  Filled 2018-01-19: qty 5

## 2018-01-19 MED ORDER — AZTREONAM 2 G IJ SOLR: 2.0000 g | Freq: Three times a day (TID) | INTRAMUSCULAR | Status: DC

## 2018-01-19 MED ORDER — VANCOMYCIN HCL IN DEXTROSE 750-5 MG/150ML-% IV SOLN: 750.0000 mg | Freq: Three times a day (TID) | INTRAVENOUS | Status: DC

## 2018-01-19 MED ORDER — LACTATED RINGERS IV BOLUS
1000.0000 mL | Freq: Once | INTRAVENOUS | Status: AC
  Administered 2018-01-19: 1000 mL via INTRAVENOUS

## 2018-01-19 MED ORDER — FENTANYL CITRATE (PF) 100 MCG/2ML IJ SOLN
100.0000 ug | INTRAMUSCULAR | Status: DC | PRN
  Administered 2018-01-19 (×6): 100 ug via INTRAVENOUS
  Filled 2018-01-19 (×6): qty 2

## 2018-01-19 NOTE — Consult Note (Signed)
ORTHOPAEDIC CONSULTATION  REQUESTING PHYSICIAN: Willy Eddy, MD  Chief Complaint:   L forearm pain  History of Present Illness: Trevor Brown is a 27 y.o. male with left forearm pain.  There is no traumatic event.  He notes he developed significant pain and swelling about the ulnar aspect of his forearm a few days ago.  Of note, he is a type I diabetic who came in with blood glucose levels in the 500s.  He has had multiple visits to the emergency room for hyperglycemia and DKA as well as prior dental and arm abscesses.  He notes that his pain currently is an 8 out of 10.  Pain is described as sharp and radiates to the wrist and fingers as well as the elbow.  Pain is improved with rest.  Pain is worse with attempted movement of his hand and fingers as well as with palpation over his forearm.  He underwent an I&D in the emergency department where there was significant return of purulent material.  There was continued concern for deeper infection and a CT scan was obtained.  This showed a large fluid collection concerning for deeper abscess.  Past Medical History:  Diagnosis Date  . Diabetes mellitus without complication (HCC)   . Heart attack (HCC)    Pt claims he had heart attack a year ago ( 2015)- and was admitted in [facility] for that, but not given any meds or angiogram, on review of chart- I could not find any details like that.  . Hepatitis C, acute may 2016  . Hypertension    Past Surgical History:  Procedure Laterality Date  . none     Social History   Socioeconomic History  . Marital status: Single    Spouse name: Not on file  . Number of children: Not on file  . Years of education: Not on file  . Highest education level: Not on file  Occupational History  . Occupation: tree cutter  Social Needs  . Financial resource strain: Not on file  . Food insecurity:    Worry: Not on file    Inability: Not on file  .  Transportation needs:    Medical: Not on file    Non-medical: Not on file  Tobacco Use  . Smoking status: Current Every Day Smoker    Packs/day: 2.00    Types: Cigarettes  . Smokeless tobacco: Never Used  Substance and Sexual Activity  . Alcohol use: No    Alcohol/week: 0.0 oz  . 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 Sexual activity: Yes  Lifestyle  . Physical activity:    Days per week: Not on file    Minutes per session: Not on file  . Stress: Not on file  Relationships  . Social connections:    Talks on phone: Not on file    Gets together: Not on file    Attends religious service: Not on file    Active member of club or organization: Not on file    Attends meetings of clubs or organizations: Not on file    Relationship status: Not on file  Other Topics Concern  . Not on file  Social History Narrative  . Not on file   Family History  Problem Relation Age of Onset  . Cirrhosis Mother   . Diabetes Mellitus II Maternal Grandmother   . Diabetes Mellitus II Maternal Grandfather    Allergies  Allergen Reactions  . Bee Venom Anaphylaxis  . Penicillins Anaphylaxis, Hives and Other (See Comments)    Has patient had a PCN reaction causing immediate rash, facial/tongue/throat swelling, SOB or lightheadedness with hypotension: Yes Has patient had a PCN reaction causing severe rash involving mucus membranes or skin necrosis: No Has patient had a PCN reaction that required hospitalization No Has patient had a PCN reaction occurring within the last 10 years: Yes If all of the above answers are "NO", then may proceed with Cephalosporin use.  . Ibuprofen Other (See Comments)    Other reaction(s): Other (See Comments) Stomach upset Reaction:  GI upset   . Tramadol Hives and Swelling  . Vancomycin Rash and Other (See Comments)    Reaction:  Red man's syndrome    Prior to Admission medications   Medication Sig Start Date End Date Taking?  Authorizing Provider  clindamycin (CLEOCIN) 150 MG capsule Take 2 capsules (300 mg total) by mouth 3 (three) times daily. 09/30/17   Fisher, Roselyn Bering, PA-C  insulin aspart (NOVOLOG) 100 UNIT/ML injection INJECT BEFORE MEALS PER SLIDING SCALE AS DIRECTED: 120-250 = 5 UNITS;251-350 = 8 UNITS; OVER 350 = 10 UNITS 12/08/17   Virl Axe, MD  insulin aspart protamine- aspart (NOVOLOG MIX 70/30) (70-30) 100 UNIT/ML injection INJECT 28 UNITS UNDER THE SKIN WITH BREAKFAST AND 22 UNITS WITH DINNER 10/29/17   Virl Axe, MD  nicotine (NICODERM CQ - DOSED IN MG/24 HOURS) 21 mg/24hr patch Place 1 patch (21 mg total) onto the skin daily. 04/21/17   Alford Highland, MD   Recent Labs    01/19/18 1352  WBC 10.8*  HGB 13.1  HCT 39.3*  PLT 261  K 4.4  4.3  CL 90*  89*  CO2 21*  22  BUN 13  13  CREATININE 0.85  0.80  GLUCOSE 571*  582*  CALCIUM 8.9  8.7*   Dg Forearm Left  Result Date: 01/19/2018 CLINICAL DATA:  Left forearm swelling for 4 days. History of prior forearm abscess. Diabetic patient. EXAM: LEFT FOREARM - 2 VIEW COMPARISON:  None. FINDINGS: Soft tissues appear diffusely swollen, most focal along the dorsal and medial aspect of the mid forearm. No radiopaque foreign body or soft tissue gas collection. No bony destructive change or periosteal reaction. No fracture. IMPRESSION: Soft tissue swelling most notable in the posterior, medial aspect of the mid forearm. The exam is otherwise negative. Electronically Signed   By: Drusilla Kanner M.D.   On: 01/19/2018 14:26   Ct Forearm Left W Contrast  Result Date: 01/19/2018 CLINICAL DATA:  Left forearm abscess. Edema and redness of the forearm. EXAM: CT OF THE UPPER LEFT EXTREMITY WITH CONTRAST TECHNIQUE: Multidetector CT imaging of the upper left extremity was performed according to the standard protocol following intravenous contrast administration. COMPARISON:  None. CONTRAST:  75mL OMNIPAQUE IOHEXOL 300 MG/ML  SOLN FINDINGS:  Bones/Joint/Cartilage No fracture or dislocation. Normal alignment. No joint effusion. Ligaments Ligaments are suboptimally evaluated by CT. Muscles and Tendons 2 x 4 x 8.5 cm complex fluid collection with peripheral enhancement in the flexor digitorum muscle. Soft tissue wound along the peripheral aspect of fluid collection. Soft tissue edema and skin thickening circumferentially around the forearm most severe along the ulnar aspect. Muscles are otherwise normal. Flexor and extensor compartment tendons are grossly intact. Soft tissue No other fluid collection or hematoma.  No soft tissue mass. IMPRESSION: 1. 2 x 4 x 8.5 cm intramuscular complex fluid collection in the flexor digitorum  muscle of the forearm most consistent with pyomyositis. Cellulitis of left forearm most severe along the ulnar aspect. 2. No osteomyelitis of the left forearm. Electronically Signed   By: Elige KoHetal  Aggie Douse   On: 01/19/2018 17:01     Positive ROS: All other systems have been reviewed and were otherwise negative with the exception of those mentioned in the HPI and as above.  Physical Exam: BP 130/76   Pulse 79   Temp 99.9 F (37.7 C) (Oral)   Resp 18   Ht 5\' 4"  (1.626 m)   Wt 56.7 kg (125 lb)   SpO2 97%   BMI 21.46 kg/m  General:  Alert, no acute distress Psychiatric:  Patient is competent for consent with normal mood and affect   Cardiovascular:  No pedal edema, regular rate and rhythm Respiratory:  No wheezing, non-labored breathing GI:  Abdomen is soft and non-tender Skin:  No lesions in the area of chief complaint, no erythema Neurologic:  Sensation intact distally, CN grossly intact Lymphatic:  No axillary or cervical lymphadenopathy  Orthopedic Exam:  LUE: +ain/pin/u motor SILT r/u/m/ax +rad pulse Prior I&D at level of mid forearm on the ulnar side with packing currently.  No current drainage. Significant erythema about the ulnar aspect of his forearm with radiation just distal to the wrist and just  proximal to the elbow joints.  Exquisite tenderness about prior I&D site at the mid forearm on the ulnar/volar aspect.  There is tenderness about the entire length of the forearm on the ulnar side with significant tenderness about the hypothenar eminence.  X-rays/CT:  As above: Significant fluid collection concerning for abscess, likely involving deep volar compartment and/or superficial volar compartments  Assessment/Plan: 27 yo M with history of uncontrolled type 1 diabetes and current hyperglycemia in the 500s on admission presenting with deep forearm abscess.  Presentation initially appeared to be more of a superficial abscess and this was I&D'd in the emergency department, but there was significantly more purulent return than expected.  CT shows deeper abscess.  Clinical exam correlates with this.  Additionally on exam, there may be further tracking of this abscess distally about the wrist and hyperthenar region. -Patient is already received IV antibiotics.  I recommended continuing this. -Elevate extremity as much as possible. -Patient would likely benefit from an I&D of his forearm.  I recommended transfer for evaluation by a hand/upper extremity specialist given the extent of the abscess with possible tracking distally beyond the wrist.    Signa KellSunny Maguire Brown   01/19/2018 5:32 PM

## 2018-01-19 NOTE — ED Notes (Addendum)
[facility] Maralyn [facility]agoSarah[facility]. Pt updated with plan of care. VSS.

## 2018-01-19 NOTE — ED Notes (Signed)
Patient transported to CT 

## 2018-01-19 NOTE — ED Provider Notes (Signed)
[facility] Emergency Department Provider Note    First MD Initiated Contact with Patient 01/19/18 1345     (approximate)  I have reviewed the triage vital signs and the nursing notes.   HISTORY  Chief Complaint Hyperglycemia    HPI Trevor Brown is a 27 y.o. male is a ER with chief complaint of left arm swelling that became acutely worse over the past 24 hours.  States he noticed worsening pain to left forearm 3 to 4 days ago and has been admitted to the hospital [facility] over 1 month ago for abscess that was surgically debrided.  States that he has been taking his insulin but is unsure what his blood sugars have been for the past few days.  Denies any measured fevers.  No nausea or vomiting.  Not currently on any antibiotics.    Past Medical History:  Diagnosis Date  . Diabetes mellitus without complication (HCC)   . Heart attack (HCC)    Pt claims he had heart attack a year ago ( 2015)- and was admitted in [facility] for that, but not given any meds or angiogram, on review of chart- I could not find any details like that.  . Hepatitis C, acute may 2016  . Hypertension    Family History  Problem Relation Age of Onset  . Cirrhosis Mother   . Diabetes Mellitus II Maternal Grandmother   . Diabetes Mellitus II Maternal Grandfather    Past Surgical History:  Procedure Laterality Date  . none     Patient Active Problem List   Diagnosis Date Noted  . Abdominal pain 06/29/2017  . Hyponatremia 06/29/2017  . DKA (diabetic ketoacidosis) (HCC) 06/29/2017  . Dental erosion extending into pulp 12/31/2016  . 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 Elevated transaminase level 03/27/2016  . Diabetes mellitus type 1 (HCC) 03/27/2016  . Tobacco abuse counseling 03/27/2016  . Transaminitis 03/25/2016  . Tobacco abuse 01/10/2016  . Type 1 diabetes mellitus with hyperglycemia (HCC) 12/12/2015  . Cocaine abuse (HCC) 12/12/2015  . DKA (diabetic ketoacidoses) (HCC)  06/10/2015  . Hidradenitis suppurativa of left axilla   . DKA, type 1 (HCC) 06/06/2015  . Malnutrition of moderate degree (HCC) 04/08/2015  . Hepatitis C 12/13/2014  . Diabetes type 1, uncontrolled (HCC) 12/11/2014  . Major depressive disorder, single episode, mild (HCC)   . Major depression, single episode 12/10/2014  . Diabetes mellitus type 1, uncontrolled (HCC) 12/09/2014  . Homelessness 12/09/2014      Prior to Admission medications   Medication Sig Start Date End Date Taking? Authorizing Provider  clindamycin (CLEOCIN) 150 MG capsule Take 2 capsules (300 mg total) by mouth 3 (three) times daily. 09/30/17   Fisher, Roselyn Bering, PA-C  insulin aspart (NOVOLOG) 100 UNIT/ML injection INJECT BEFORE MEALS PER SLIDING SCALE AS DIRECTED: 120-250 = 5 UNITS;251-350 = 8 UNITS; OVER 350 = 10 UNITS 12/08/17   Virl Axe, MD  insulin aspart protamine- aspart (NOVOLOG MIX 70/30) (70-30) 100 UNIT/ML injection INJECT 28 UNITS UNDER THE SKIN WITH BREAKFAST AND 22 UNITS WITH DINNER 10/29/17   Virl Axe, MD  nicotine (NICODERM CQ - DOSED IN MG/24 HOURS) 21 mg/24hr patch Place 1 patch (21 mg total) onto the skin daily. 04/21/17   Alford Highland, MD    Allergies Bee venom; Penicillins; Ibuprofen; Tramadol; and Vancomycin    Social History Social History   Tobacco Use  . Smoking status: Current Every Day Smoker    Packs/day: 2.00  Types: Cigarettes  . Smokeless tobacco: Never Used  Substance Use Topics  . Alcohol use: No    Alcohol/week: 0.0 oz  . 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 ____________________________________________   PHYSICAL EXAM:  VITAL SIGNS: Vitals:   01/19/18 1730 01/19/18  1800  BP: 121/68 130/83  Pulse: 63 (!) 57  Resp:  18  Temp:    SpO2: 98% 97%    Constitutional: Alert and oriented.  Eyes: Conjunctivae are normal.  Head: Atraumatic. Nose: No congestion/rhinnorhea. Mouth/Throat: Mucous membranes are moist.   Neck: No stridor. Painless ROM.  Cardiovascular: Normal rate, regular rhythm. Grossly normal heart sounds.  Good peripheral circulation. Respiratory: Normal respiratory effort.  No retractions. Lungs CTAB. Gastrointestinal: Soft and nontender. No distention. No abdominal bruits. No CVA tenderness. Genitourinary:  Musculoskeletal: Large area of erythema to the posterior aspect of the left forearm with small 2 cm area of fluctuance consistent with abscess.  No crepitus.  Patient able to flex and extend all digits as well as the wrist.  No lower extremity tenderness nor edema.  No joint effusions. Neurologic:  Normal speech and language. No gross focal neurologic deficits are appreciated. No facial droop Skin:  Skin is warm, dry and intact. No rash noted. Psychiatric: Mood and affect are normal. Speech and behavior are normal.  ____________________________________________   LABS (all labs ordered are listed, but only abnormal results are displayed)  Results for orders placed or performed during the hospital encounter of 01/19/18 (from the past 24 hour(s))  Glucose, capillary     Status: Abnormal   Collection Time: 01/19/18  1:23 PM  Result Value Ref Range   Glucose-Capillary 574 (HH) 70 - 99 mg/dL  Basic metabolic panel     Status: Abnormal   Collection Time: 01/19/18  1:52 PM  Result Value Ref Range   Sodium 125 (L) 135 - 145 mmol/L   Potassium 4.3 3.5 - 5.1 mmol/L   Chloride 89 (L) 98 - 111 mmol/L   CO2 22 22 - 32 mmol/L   Glucose, Bld 582 (HH) 70 - 99 mg/dL   BUN 13 6 - 20 mg/dL   Creatinine, Ser 1.610.80 0.61 - 1.24 mg/dL   Calcium 8.7 (L) 8.9 - 10.3 mg/dL   GFR calc non Af Amer >60 >60 mL/min   GFR calc Af Amer >60 >60 mL/min   Anion  gap 14 5 - 15  CBC     Status: Abnormal   Collection Time: 01/19/18  1:52 PM  Result Value Ref Range   WBC 10.8 (H) 3.8 - 10.6 K/uL   RBC 4.62 4.40 - 5.90 MIL/uL   Hemoglobin 13.1 13.0 - 18.0 g/dL   HCT 09.639.3 (L) 04.540.0 - 40.952.0 %   MCV 85.2 80.0 - 100.0 fL   MCH 28.4 26.0 - 34.0 pg   MCHC 33.4 32.0 - 36.0 g/dL   RDW 81.115.3 (H) 91.411.5 - 78.214.5 %   Platelets 261 150 - 440 K/uL  Comprehensive metabolic panel     Status: Abnormal   Collection Time: 01/19/18  1:52 PM  Result Value Ref Range   Sodium 127 (L) 135 - 145 mmol/L   Potassium 4.4 3.5 - 5.1 mmol/L   Chloride 90 (L) 98 -  111 mmol/L   CO2 21 (L) 22 - 32 mmol/L   Glucose, Bld 571 (HH) 70 - 99 mg/dL   BUN 13 6 - 20 mg/dL   Creatinine, Ser 1.61 0.61 - 1.24 mg/dL   Calcium 8.9 8.9 - 09.6 mg/dL   Total Protein 8.4 (H) 6.5 - 8.1 g/dL   Albumin 3.9 3.5 - 5.0 g/dL   AST 70 (H) 15 - 41 U/L   ALT 63 (H) 0 - 44 U/L   Alkaline Phosphatase 173 (H) 38 - 126 U/L   Total Bilirubin 1.5 (H) 0.3 - 1.2 mg/dL   GFR calc non Af Amer >60 >60 mL/min   GFR calc Af Amer >60 >60 mL/min   Anion gap 16 (H) 5 - 15  Troponin I     Status: None   Collection Time: 01/19/18  1:52 PM  Result Value Ref Range   Troponin I <0.03 <0.03 ng/mL  Blood gas, venous     Status: Abnormal   Collection Time: 01/19/18  2:39 PM  Result Value Ref Range   pH, Ven 7.35 7.250 - 7.430   pCO2, Ven 41 (L) 44.0 - 60.0 mmHg   pO2, Ven 31.0 (LL) 32.0 - 45.0 mmHg   Bicarbonate 22.6 20.0 - 28.0 mmol/L   Acid-base deficit 2.9 (H) 0.0 - 2.0 mmol/L   O2 Saturation 55.6 %   Patient temperature 37.0    Collection site VEIN    Sample type VENOUS   Lactic acid, plasma     Status: None   Collection Time: 01/19/18  2:39 PM  Result Value Ref Range   Lactic Acid, Venous 0.9 0.5 - 1.9 mmol/L  Glucose, capillary     Status: Abnormal   Collection Time: 01/19/18  2:55 PM  Result Value Ref Range   Glucose-Capillary 471 (H) 70 - 99 mg/dL  Glucose, capillary     Status: Abnormal   Collection  Time: 01/19/18  3:47 PM  Result Value Ref Range   Glucose-Capillary 402 (H) 70 - 99 mg/dL  Urinalysis, Complete w Microscopic     Status: Abnormal   Collection Time: 01/19/18  3:49 PM  Result Value Ref Range   Color, Urine STRAW (A) YELLOW   APPearance CLEAR (A) CLEAR   Specific Gravity, Urine 1.025 1.005 - 1.030   pH 5.0 5.0 - 8.0   Glucose, UA >=500 (A) NEGATIVE mg/dL   Hgb urine dipstick NEGATIVE NEGATIVE   Bilirubin Urine NEGATIVE NEGATIVE   Ketones, ur 20 (A) NEGATIVE mg/dL   Protein, ur NEGATIVE NEGATIVE mg/dL   Nitrite NEGATIVE NEGATIVE   Leukocytes, UA NEGATIVE NEGATIVE   RBC / HPF 0-5 0 - 5 RBC/hpf   WBC, UA 0-5 0 - 5 WBC/hpf   Bacteria, UA NONE SEEN NONE SEEN   Squamous Epithelial / LPF NONE SEEN 0 - 5  Glucose, capillary     Status: Abnormal   Collection Time: 01/19/18  5:01 PM  Result Value Ref Range   Glucose-Capillary 331 (H) 70 - 99 mg/dL  Glucose, capillary     Status: Abnormal   Collection Time: 01/19/18  5:41 PM  Result Value Ref Range   Glucose-Capillary 341 (H) 70 - 99 mg/dL  Glucose, capillary     Status: Abnormal   Collection Time: 01/19/18  6:07 PM  Result Value Ref Range   Glucose-Capillary 347 (H) 70 - 99 mg/dL   ____________________________________________  EKG____________________________________________  RADIOLOGY  I personally reviewed all radiographic images ordered to evaluate for the above acute  complaints and reviewed radiology reports and findings.  These findings were personally discussed with the patient.  Please see medical record for radiology report.  ____________________________________________   PROCEDURES  Procedure(s) performed:  Marland KitchenMarland KitchenIncision and Drainage Date/Time: 01/19/2018 3:15 PM Performed by: Willy Eddy, MD Authorized by: Willy Eddy, MD   Consent:    Consent obtained:  Verbal   Consent given by:  Patient   Risks discussed:  Bleeding, infection, incomplete drainage and pain   Alternatives discussed:   Alternative treatment, delayed treatment and observation Location:    Type:  Abscess   Location:  Upper extremity   Upper extremity location:  Arm   Arm location:  L lower arm Anesthesia (see MAR for exact dosages):    Anesthesia method:  Local infiltration   Local anesthetic:  Lidocaine 1% w/o epi Procedure type:    Complexity:  Complex Procedure details:    Incision types:  Single straight   Incision depth:  Subcutaneous   Scalpel blade:  11   Wound management:  Probed and deloculated and extensive cleaning   Drainage:  Purulent   Drainage amount:  Copious   Wound treatment:  Wound left open and drain placed   Packing materials:  1/4 in iodoform gauze Post-procedure details:    Patient tolerance of procedure:  Tolerated well, no immediate complications  .Critical Care Performed by: Willy Eddy, MD Authorized by: Willy Eddy, MD   Critical care provider statement:    Critical care time (minutes):  45   Critical care time was exclusive of:  Separately billable procedures and treating other patients   Critical care was necessary to treat or prevent imminent or life-threatening deterioration of the following conditions:  Sepsis and metabolic crisis   Critical care was time spent personally by me on the following activities:  Development of treatment plan with patient or surrogate, discussions with consultants, evaluation of patient's response to treatment, examination of patient, obtaining history from patient or surrogate, ordering and performing treatments and interventions, ordering and review of laboratory studies, ordering and review of radiographic studies, pulse oximetry, re-evaluation of patient's condition and review of old charts      Critical Care performed: yes ____________________________________________   INITIAL IMPRESSION / ASSESSMENT AND PLAN / ED COURSE  Pertinent labs & imaging results that were available during my care of the patient were  reviewed by me and considered in my medical decision making (see chart for details).   DDX: This, DKA, dehydration, abscess, neck fashion, cellulitis  Trevor Brown is a 27 y.o. who presents to the ED with symptoms as described above.  Patient on his facility.  Low-grade temperature.  Does have significant swelling.  Abscess I&D with significant volume of purulent drainage for more than I would expect.  Based on his immune compromised state we will start on broad-spectrum might be antibiotics.  Will treat for DKA with IV insulin and IV fluids.  Order CT imaging of the left upper extremity evaluate for deep space infection.  Clinical Course as of Jan 20 1839  Mon Jan 19, 2018  1725 CT imaging does show evidence of probable pyomyositis.  Patient received broad-spectrum antibiotics.  Patient is on IV insulin drip with additional IV fluids.  I spoke with Dr. Allena Katz of orthopedic surgery who kindly agrees to evaluate patient at bedside.   [PR]  [phone] Spoke with Dr. Allena Katz who is evaluated patient and due to the extent of muscle body abscess surrounding cellulitis involving the flexor sheath has recommended patient  be evaluated by Ortho hand.  Will speak with Kendell Bane for transfer.   [PR]  [phone] Spoke with Dr. Lafayette Dragon of plastics at [facility] who kindly agrees to evaluate patient for operative management.    [PR]    Clinical Course User Index [PR] Willy Eddy, MD     As part of my medical decision making, I reviewed the following data within the electronic MEDICAL RECORD NUMBER Nursing notes reviewed and incorporated, Labs reviewed, notes from prior ED visits.   ____________________________________________   FINAL CLINICAL IMPRESSION(S) / ED DIAGNOSES  Final diagnoses:  Pyomyositis  Diabetic ketoacidosis without coma associated with type 1 diabetes mellitus (HCC)      NEW MEDICATIONS STARTED DURING THIS VISIT:  New Prescriptions   No medications on file     Note:  This document was  prepared using Dragon voice recognition software and may include unintentional dictation errors.    Willy Eddy, MD 01/19/18 1840

## 2018-01-19 NOTE — ED Notes (Signed)
Pt walked back to treatment room. Joni ReiningNicole, RN aware of patient in room.

## 2018-01-19 NOTE — ED Notes (Signed)
MD Roxan Hockeyobinson at bedside to drain abscess.

## 2018-01-19 NOTE — ED Notes (Signed)
EMTALA review

## 2018-01-19 NOTE — ED Notes (Signed)
Attempted to obtain IV access x3. Unsuccessful.

## 2018-01-19 NOTE — Progress Notes (Signed)
Pharmacy Antibiotic Note  Trevor Brown is a 27 y.o. male admitted on 01/19/2018 with cellulitis/purulent deep abscess of arm  Pharmacy has been consulted for Vancomycin, Metronidazole and Aztreonam dosing.  Hx of DM 1 and prior abscesses and multiple ER visits.  Allergies  Allergen Reactions  . Bee Venom Anaphylaxis  . Penicillins Anaphylaxis, Hives and Other (See Comments)    Has patient had a PCN reaction causing immediate rash, facial/tongue/throat swelling, SOB or lightheadedness with hypotension: Yes Has patient had a PCN reaction causing severe rash involving mucus membranes or skin necrosis: No Has patient had a PCN reaction that required hospitalization No Has patient had a PCN reaction occurring within the last 10 years: Yes If all of the above answers are "NO", then may proceed with Cephalosporin use.  . Ibuprofen Other (See Comments)    Other reaction(s): Other (See Comments) Stomach upset Reaction:  GI upset   . Tramadol Hives and Swelling  . 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 Will continue with Vancomycin 750 IV every 8 hours.  Goal trough 15-20 mcg/mL.  Ke 0.098  T1/2 7 hrs  Vd 39.7   Will check Vanc trough prior to 6th dose on 7/3. Note: hx of Red Mans syndrome- would infuse Vancomycin for longer intervals.  Metronidazole 500 mg IV q8h.  Aztreonam 2 gram IV q8h.     Height: 5\' 4"  (162.6 cm) Weight: 125 lb (56.7 kg) IBW/kg (Calculated) : 59.2  Temp (24hrs), Avg:99.9 F (37.7 C), Min:99.9 F (37.7 C), Max:99.9 F (37.7 C)  Recent Labs  Lab 01/19/18 1352 01/19/18 1439  WBC 10.8*  --   CREATININE 0.85  0.80  --   LATICACIDVEN  --  0.9    Estimated Creatinine Clearance: 113.2 mL/min (by C-G formula based on SCr of 0.8 mg/dL).    Allergies  Allergen Reactions  . Bee Venom  Anaphylaxis  . Penicillins Anaphylaxis, Hives and Other (See Comments)    Has patient had a PCN reaction causing immediate rash, facial/tongue/throat swelling, SOB or lightheadedness with hypotension: Yes Has patient had a PCN reaction causing severe rash involving mucus membranes or skin necrosis: No Has patient had a PCN reaction that required hospitalization No Has patient had a PCN reaction occurring within the last 10 years: Yes If all of the above answers are "NO", then may proceed with Cephalosporin use.  . Ibuprofen Other (See Comments)    Other reaction(s): Other (See Comments) Stomach upset Reaction:  GI upset   . Tramadol Hives and Swelling  . Vancomycin Rash and Other (See Comments)    Reaction:  Red man's syndrome     Antimicrobials this admission: Vanc  7/1 >>   Aztreonam 7/1 >>   Metro 7/1 >>  Dose adjustments this admission:    Microbiology results:   BCx:    7/1 UCx: pending    Sputum:      MRSA PCR:   7/1 wound cx: pend  Thank you for allowing pharmacy to be a part of this patient's care.  Jannett Schmall A 01/19/2018 6:31 PM

## 2018-01-19 NOTE — ED Triage Notes (Signed)
Patient presents to ED via POV from home with abscess noted to left posterior forearm. Patient states "It's been messing with my sugars". Hyperglycemic in triage in the 500's. Edema and redness noted to affected arm. Patient reports history of same that required hospital admission.

## 2018-01-19 NOTE — ED Notes (Signed)
[facility] VSS.

## 2018-01-20 DIAGNOSIS — F1910 Other psychoactive substance abuse, uncomplicated: Secondary | ICD-10-CM | POA: Insufficient documentation

## 2018-01-21 LAB — URINE CULTURE: Culture: NO GROWTH

## 2018-01-24 LAB — AEROBIC/ANAEROBIC CULTURE W GRAM STAIN (SURGICAL/DEEP WOUND)

## 2018-01-24 LAB — AEROBIC/ANAEROBIC CULTURE (SURGICAL/DEEP WOUND)

## 2018-02-27 ENCOUNTER — Telehealth: Payer: Self-pay | Admitting: Pharmacist

## 2018-02-27 NOTE — Telephone Encounter (Signed)
02/27/2018 10:40:07 AM - Novolog Vial & Novolog mix 70/30 pending  02/27/18 [facility] & Novolog Mix 70/30-holding for patient to return his portion-mailed to patient 02/18/18.Forde RadonAJ

## 2018-03-31 ENCOUNTER — Other Ambulatory Visit: Payer: Self-pay | Admitting: Internal Medicine

## 2018-03-31 ENCOUNTER — Other Ambulatory Visit: Payer: Self-pay | Admitting: Adult Health Nurse Practitioner

## 2018-03-31 DIAGNOSIS — E108 Type 1 diabetes mellitus with unspecified complications: Secondary | ICD-10-CM

## 2018-04-21 ENCOUNTER — Emergency Department
Admission: EM | Admit: 2018-04-21 | Discharge: 2018-04-21 | Disposition: A | Discharge status: Home / Self Care | Payer: Self-pay | Attending: Emergency Medicine | Admitting: Emergency Medicine

## 2018-04-21 ENCOUNTER — Emergency Department: Payer: Self-pay

## 2018-04-21 ENCOUNTER — Encounter: Payer: Self-pay | Admitting: Emergency Medicine

## 2018-04-21 DIAGNOSIS — E860 Dehydration: Secondary | ICD-10-CM | POA: Insufficient documentation

## 2018-04-21 DIAGNOSIS — F17210 Nicotine dependence, cigarettes, uncomplicated: Secondary | ICD-10-CM | POA: Insufficient documentation

## 2018-04-21 DIAGNOSIS — R739 Hyperglycemia, unspecified: Secondary | ICD-10-CM

## 2018-04-21 DIAGNOSIS — Z794 Long term (current) use of insulin: Secondary | ICD-10-CM | POA: Insufficient documentation

## 2018-04-21 DIAGNOSIS — L03114 Cellulitis of left upper limb: Secondary | ICD-10-CM | POA: Insufficient documentation

## 2018-04-21 DIAGNOSIS — E1165 Type 2 diabetes mellitus with hyperglycemia: Secondary | ICD-10-CM | POA: Insufficient documentation

## 2018-04-21 DIAGNOSIS — I10 Essential (primary) hypertension: Secondary | ICD-10-CM | POA: Insufficient documentation

## 2018-04-21 LAB — URINALYSIS, COMPLETE (UACMP) WITH MICROSCOPIC
Bacteria, UA: NONE SEEN
Bilirubin Urine: NEGATIVE
Glucose, UA: >=500 mg/dL — AB
Hgb urine dipstick: NEGATIVE
Ketones, ur: 20 mg/dL — AB
Leukocytes, UA: NEGATIVE
Nitrite: NEGATIVE
Protein, ur: NEGATIVE mg/dL
Specific Gravity, Urine: 1.035 — ABNORMAL HIGH (ref 1.005–1.030)
Squamous Epithelial / LPF: NONE SEEN (ref 0–5)
pH: 6 (ref 5.0–8.0)

## 2018-04-21 LAB — URINE DRUG SCREEN, QUALITATIVE (ARMC ONLY)
Amphetamines, Ur Screen: NOT DETECTED
Barbiturates, Ur Screen: NOT DETECTED
Benzodiazepine, Ur Scrn: NOT DETECTED
Cannabinoid 50 Ng, Ur ~~LOC~~: NOT DETECTED
Cocaine Metabolite,Ur ~~LOC~~: NOT DETECTED
MDMA (Ecstasy)Ur Screen: NOT DETECTED
Methadone Scn, Ur: NOT DETECTED
Opiate, Ur Screen: POSITIVE — AB
Phencyclidine (PCP) Ur S: NOT DETECTED
Tricyclic, Ur Screen: NOT DETECTED

## 2018-04-21 LAB — BLOOD GAS, VENOUS
Acid-base deficit: 0.8 mmol/L (ref 0.0–2.0)
Bicarbonate: 23.5 mmol/L (ref 20.0–28.0)
O2 Saturation: 90.4 %
Patient temperature: 37
pCO2, Ven: 37 mmHg — ABNORMAL LOW (ref 44.0–60.0)
pH, Ven: 7.41 (ref 7.250–7.430)
pO2, Ven: 59 mmHg — ABNORMAL HIGH (ref 32.0–45.0)

## 2018-04-21 LAB — CBC
HCT: 38.3 % — ABNORMAL LOW (ref 40.0–52.0)
Hemoglobin: 13 g/dL (ref 13.0–18.0)
MCH: 29.5 pg (ref 26.0–34.0)
MCHC: 34 g/dL (ref 32.0–36.0)
MCV: 87 fL (ref 80.0–100.0)
Platelets: 243 10*3/uL (ref 150–440)
RBC: 4.4 MIL/uL (ref 4.40–5.90)
RDW: 15.9 % — ABNORMAL HIGH (ref 11.5–14.5)
WBC: 8.6 10*3/uL (ref 3.8–10.6)

## 2018-04-21 LAB — BASIC METABOLIC PANEL
Anion gap: 13 (ref 5–15)
BUN: 16 mg/dL (ref 6–20)
CO2: 23 mmol/L (ref 22–32)
Calcium: 8.8 mg/dL — ABNORMAL LOW (ref 8.9–10.3)
Chloride: 100 mmol/L (ref 98–111)
Creatinine, Ser: 0.69 mg/dL (ref 0.61–1.24)
GFR calc Af Amer: >60 mL/min (ref >60)
GFR calc non Af Amer: >60 mL/min (ref >60)
Glucose, Bld: 301 mg/dL — ABNORMAL HIGH (ref 70–99)
Potassium: 4.1 mmol/L (ref 3.5–5.1)
Sodium: 136 mmol/L (ref 135–145)

## 2018-04-21 LAB — GLUCOSE, CAPILLARY
Glucose-Capillary: 341 mg/dL — ABNORMAL HIGH (ref 70–99)
Glucose-Capillary: 213 mg/dL — ABNORMAL HIGH (ref 70–99)

## 2018-04-21 MED ORDER — KETOROLAC TROMETHAMINE 30 MG/ML IJ SOLN
15.0000 mg | INTRAMUSCULAR | Status: AC
  Administered 2018-04-21: 15 mg via INTRAVENOUS
  Filled 2018-04-21: qty 1

## 2018-04-21 MED ORDER — SODIUM CHLORIDE 0.9 % IV BOLUS
1000.0000 mL | Freq: Once | INTRAVENOUS | Status: AC
  Administered 2018-04-21: 1000 mL via INTRAVENOUS

## 2018-04-21 MED ORDER — DOXYCYCLINE HYCLATE 100 MG PO CAPS: 100.0000 mg | ORAL_CAPSULE | Freq: Two times a day (BID) | ORAL | 0 refills | Status: DC

## 2018-04-21 NOTE — ED Triage Notes (Signed)
PT arrived from home via ems with concerns over hyperglycemia. Pt states he ran out of his test strips but "had a gut feeling" it was high. Pt states he took 22 units of 70/30 at 0630 this morning. Pt a & o x 4 and only other complaints is a possible abscess to his left inner arm near his elbow. Area swollen and red. EMS reports blood sugar of 512. Blood sugar of 341 in triage.

## 2018-04-21 NOTE — Discharge Instructions (Signed)
Take doxycycline as prescribed for your skin infection, and take anti-inflammatory medicine for the pain.  You should follow-up with a dentist for extraction of all your decayed teeth.  Follow-up with primary care for continued monitoring of your diabetes and skin infection.

## 2018-04-21 NOTE — ED Provider Notes (Signed)
[facility] Emergency Department Provider Note  ____________________________________________  Time seen: Approximately 10:15 AM  I have reviewed the triage vital signs and the nursing notes.   HISTORY  Chief Complaint Hyperglycemia    HPI Trevor Brown is a 27 y.o. male with a history of diabetes, hepatitis C, hypertension, IV drug abuse who complains of left elbow pain for the past week, gradual onset, worsening, constant.  No aggravating or alleviating factors.  Nonradiating.  Denies fever or chills.  No vomiting.  Taking his insulin as prescribed.    The patient denies any drug use or IV drug use in the past, but nursing reports that an interested party did call the ED to notify us that the patient does use IV drugs and had wanted to come to the hospital today to obtain opioids and they refused to drive him here because they did not want to be a part of that endeavor.      Past Medical History:  Diagnosis Date  . Diabetes mellitus without complication (HCC)   . Heart attack (HCC)    Pt claims he had heart attack a year ago ( 2015)- and was admitted in [facility] for that, but not given any meds or angiogram, on review of chart- I could not find any details like that.  . Hepatitis C, acute may 2016  . Hypertension      Patient Active Problem List   Diagnosis Date Noted  . Abdominal pain 06/29/2017  . Hyponatremia 06/29/2017  . DKA (diabetic ketoacidosis) (HCC) 06/29/2017  . Dental erosion extending into pulp 12/31/2016  . 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 Elevated transaminase level 03/27/2016  . Diabetes mellitus type 1 (HCC) 03/27/2016  . Tobacco abuse counseling 03/27/2016  . Transaminitis 03/25/2016  . Tobacco abuse 01/10/2016  . Type 1 diabetes mellitus with hyperglycemia (HCC) 12/12/2015  . Cocaine abuse (HCC) 12/12/2015  . DKA (diabetic ketoacidoses) (HCC) 06/10/2015  . Hidradenitis suppurativa of left axilla   . DKA, type 1 (HCC) 06/06/2015  .  Malnutrition of moderate degree (HCC) 04/08/2015  . Hepatitis C 12/13/2014  . Diabetes type 1, uncontrolled (HCC) 12/11/2014  . Major depressive disorder, single episode, mild (HCC)   . Major depression, single episode 12/10/2014  . Diabetes mellitus type 1, uncontrolled (HCC) 12/09/2014  . Homelessness 12/09/2014     Past Surgical History:  Procedure Laterality Date  . none       Prior to Admission medications   Medication Sig Start Date End Date Taking? Authorizing Provider  insulin aspart (NOVOLOG) 100 UNIT/ML injection INJECT BEFORE MEALS PER SLIDING SCALE AS DIRECTED: 120-250 = 5 UNITS;251-350 = 8 UNITS; OVER 350 = 10 UNITS Patient taking differently: Inject 5-10 Units into the skin See admin instructions. INJECT BEFORE MEALS PER SLIDING SCALE AS DIRECTED: 120-250 = 5 UNITS;251-350 = 8 UNITS; OVER 350 = 10 UNITS 03/31/18  Yes Doles-Johnson, Teah, NP  insulin aspart protamine- aspart (NOVOLOG MIX 70/30) (70-30) 100 UNIT/ML injection INJECT 28 UNITS UNDER THE SKIN WITH BREAKFAST AND 22 UNITS WITH DINNER Patient taking differently: Inject 22-23 Units into the skin See admin instructions. 22 units every morning and 23 units at bedtime 03/31/18  Yes Doles-Johnson, Teah, NP  doxycycline (VIBRAMYCIN) 100 MG capsule Take 1 capsule (100 mg total) by mouth 2 (two) times daily. 04/21/18   Sharman Cheek, MD     Allergies Bee venom; Penicillins; Ibuprofen; Tramadol; and Vancomycin   Family History  Problem Relation Age of Onset  . Cirrhosis Mother   .  Diabetes Mellitus II Maternal Grandmother   . Diabetes Mellitus II Maternal Grandfather     Social History Social History   Tobacco Use  . Smoking status: Current Every Day Smoker    Packs/day: 2.00    Types: Cigarettes  . Smokeless tobacco: Never Used  Substance Use Topics  . Alcohol use: No    Alcohol/week: 0.0 standard drinks  . 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  ENT:   No sore throat. No rhinorrhea. Cardiovascular:   No chest pain or syncope. Respiratory:   No dyspnea or cough. Gastrointestinal:   Negative for abdominal pain, vomiting and diarrhea.  Musculoskeletal: Left elbow pain as above All other systems reviewed and are negative except as documented above in ROS and HPI.  ____________________________________________   PHYSICAL EXAM:  VITAL SIGNS: ED Triage Vitals [04/21/18 0805]  Enc Vitals Group     BP 136/86     Pulse Rate 85     Resp 18     Temp 97.6 F (36.4 C)     Temp Source Oral     SpO2 99 %     Weight 120 lb (54.4 kg)     Height 5\' 4"  (1.626 m)     Head Circumference      Peak Flow      Pain Score 10     Pain Loc      Pain Edu?      Excl. in GC?     Vital signs reviewed, nursing assessments reviewed.   Constitutional:   Alert and oriented. Non-toxic appearance.  Cachectic appearance.   Eyes:   Conjunctivae are normal. EOMI. PERRL. ENT      Head:   Normocephalic and atraumatic.  No swelling of the cheeks.      Nose:   No congestion/rhinnorhea.       Mouth/Throat:   Dry mucous membranes, no pharyngeal erythema. No peritonsillar mass.  Poor dentition with diffusely decayed teeth.  No gingival swelling or purulent drainage.  Floor of mouth is soft.  No tongue elevation.      Neck:   No meningismus. Full ROM.  Nontender, no crepitus Hematological/Lymphatic/Immunilogical:   No cervical lymphadenopathy. Cardiovascular:   RRR. Symmetric bilateral radial and DP pulses.  No murmurs. Cap refill less than 2 seconds. Respiratory:   Normal respiratory effort without tachypnea/retractions. Breath sounds are clear and equal bilaterally. No wheezes/rales/rhonchi. Gastrointestinal:   Soft and nontender. Non distended. There is no CVA tenderness.  No rebound, rigidity, or guarding. Musculoskeletal:   Normal range of motion in all extremities. No joint effusions.  No lower  extremity tenderness.  No edema.  There is approximately 3 cm area of skin erythema induration warmth and tenderness on the medial aspect of the distal upper arm.  Does not appear to involve the elbow joint, and there is painless range of motion of the joint.  No fluctuance or drainage.  No discoloration. Neurologic:   Normal speech and language.  Motor grossly intact. No acute focal neurologic deficits are appreciated.  Skin:    Skin is warm, dry and intact.  Inflamed area near the left elbow as noted above.  There are suspected injection track marks near the left antecubital fossa as well. ____________________________________________    LABS (pertinent positives/negatives) (all labs ordered are listed, but only abnormal results are displayed) Labs  Reviewed  BASIC METABOLIC PANEL - Abnormal; Notable for the following components:      Result Value   Glucose, Bld 301 (*)    Calcium 8.8 (*)    All other components within normal limits  CBC - Abnormal; Notable for the following components:   HCT 38.3 (*)    RDW 15.9 (*)    All other components within normal limits  URINALYSIS, COMPLETE (UACMP) WITH MICROSCOPIC - Abnormal; Notable for the following components:   Color, Urine YELLOW (*)    APPearance CLEAR (*)    Specific Gravity, Urine 1.035 (*)    Glucose, UA >=500 (*)    Ketones, ur 20 (*)    All other components within normal limits  BLOOD GAS, VENOUS - Abnormal; Notable for the following components:   pCO2, Ven 37 (*)    pO2, Ven 59.0 (*)    All other components within normal limits  GLUCOSE, CAPILLARY - Abnormal; Notable for the following components:   Glucose-Capillary 341 (*)    All other components within normal limits  GLUCOSE, CAPILLARY - Abnormal; Notable for the following components:   Glucose-Capillary 213 (*)    All other components within normal limits  URINE DRUG SCREEN, QUALITATIVE ([facility] ONLY)  CBG MONITORING, ED  CBG MONITORING, ED    ____________________________________________   EKG    ____________________________________________    RADIOLOGY  Dg Elbow 2 Views Left  Result Date: 04/21/2018 CLINICAL DATA:  Possible left elbow abscess. EXAM: LEFT ELBOW - 2 VIEW COMPARISON:  Radiographs of January 19, 2018. FINDINGS: There is no evidence of fracture, dislocation, or joint effusion. There is no evidence of arthropathy or other focal bone abnormality. Soft tissue swelling is noted around the elbow. IMPRESSION: No fracture or dislocation is noted. No lytic destruction is seen to suggest osteomyelitis. Soft tissue swelling is noted around the elbow suggesting inflammation. No radiopaque foreign body is noted. Electronically Signed   By: Lupita Raider, M.D.   On: 04/21/2018 10:02    ____________________________________________   PROCEDURES Procedures  ____________________________________________  DIFFERENTIAL DIAGNOSIS   Dehydration, DKA, cellulitis, retained foreign body in the elbow  CLINICAL IMPRESSION / ASSESSMENT AND PLAN / ED COURSE  Pertinent labs & imaging results that were available during my care of the patient were reviewed by me and considered in my medical decision making (see chart for details).    Patient is nontoxic, presents with hyperglycemia.  Labs do not show any signs of DKA.  Awaiting urinalysis for further assessment.  After 1 L saline bolus, blood sugar has improved to 213.  No signs of acute dental abscess.  X-ray is negative for retained foreign body at the elbow and it does not look like osteomyelitis necrotizing fasciitis or subcutaneous abscess.  Plan to start him on antibiotics orally for cellulitis and discharged outpatient follow-up.  He is chronically ill and appears underweight, but without indication for hospitalization at this time.  Clinical Course as of Apr 21 1254  Tue Apr 21, 2018  1022 Also noteworthy that LUE cellulitis is very limited in extent and without signs of  lymphangitis. Suitable for trial or PO therapy   [PS]    Clinical Course User Index [PS] Sharman Cheek, MD     ----------------------------------------- 12:54 PM on 04/21/2018 -----------------------------------------  X-ray unremarkable.  Urinalysis unremarkable.  Vital signs remained stable, tolerating oral intake, suitable for discharge home outpatient follow-up.  Doxycycline to cover both MRSA and possible strep.  ____________________________________________   FINAL CLINICAL IMPRESSION(S) /  ED DIAGNOSES    Final diagnoses:  Hyperglycemia  Dehydration  Left arm cellulitis     ED Discharge Orders         Ordered    doxycycline (VIBRAMYCIN) 100 MG capsule  2 times daily     04/21/18 1253          Portions of this note were generated with dragon dictation software. Dictation errors may occur despite best attempts at proofreading.    Sharman Cheek, MD 04/21/18 1255

## 2018-04-21 NOTE — ED Notes (Signed)
IV removed.

## 2018-05-20 ENCOUNTER — Other Ambulatory Visit: Payer: Self-pay | Admitting: Adult Health Nurse Practitioner

## 2018-05-20 DIAGNOSIS — E108 Type 1 diabetes mellitus with unspecified complications: Secondary | ICD-10-CM

## 2018-06-13 ENCOUNTER — Other Ambulatory Visit: Payer: Self-pay

## 2018-06-13 ENCOUNTER — Emergency Department: Payer: Self-pay

## 2018-06-13 ENCOUNTER — Ambulatory Visit (HOSPITAL_COMMUNITY)
Admission: AD | Admit: 2018-06-13 | Discharge: 2018-06-13 | Disposition: A | Discharge status: Home / Self Care | Payer: Medicaid Other | Source: Other Acute Inpatient Hospital | Attending: Student in an Organized Health Care Education/Training Program | Admitting: Student in an Organized Health Care Education/Training Program

## 2018-06-13 ENCOUNTER — Encounter: Payer: Self-pay | Admitting: Emergency Medicine

## 2018-06-13 ENCOUNTER — Emergency Department
Admission: EM | Admit: 2018-06-13 | Discharge: 2018-06-14 | Disposition: A | Discharge status: Other Acute Inpatient Hospital | Payer: Self-pay | Attending: Student in an Organized Health Care Education/Training Program | Admitting: Student in an Organized Health Care Education/Training Program

## 2018-06-13 DIAGNOSIS — F1410 Cocaine abuse, uncomplicated: Secondary | ICD-10-CM | POA: Insufficient documentation

## 2018-06-13 DIAGNOSIS — Z794 Long term (current) use of insulin: Secondary | ICD-10-CM | POA: Insufficient documentation

## 2018-06-13 DIAGNOSIS — Z590 Homelessness: Secondary | ICD-10-CM | POA: Insufficient documentation

## 2018-06-13 DIAGNOSIS — F17210 Nicotine dependence, cigarettes, uncomplicated: Secondary | ICD-10-CM | POA: Insufficient documentation

## 2018-06-13 DIAGNOSIS — I10 Essential (primary) hypertension: Secondary | ICD-10-CM | POA: Insufficient documentation

## 2018-06-13 DIAGNOSIS — E1010 Type 1 diabetes mellitus with ketoacidosis without coma: Secondary | ICD-10-CM | POA: Insufficient documentation

## 2018-06-13 DIAGNOSIS — E1110 Type 2 diabetes mellitus with ketoacidosis without coma: Secondary | ICD-10-CM | POA: Insufficient documentation

## 2018-06-13 DIAGNOSIS — L02414 Cutaneous abscess of left upper limb: Secondary | ICD-10-CM | POA: Insufficient documentation

## 2018-06-13 DIAGNOSIS — F329 Major depressive disorder, single episode, unspecified: Secondary | ICD-10-CM | POA: Insufficient documentation

## 2018-06-13 LAB — URINALYSIS, COMPLETE (UACMP) WITH MICROSCOPIC
Bacteria, UA: NONE SEEN
Bilirubin Urine: NEGATIVE
Glucose, UA: >=500 mg/dL — AB
Ketones, ur: 80 mg/dL — AB
Leukocytes, UA: NEGATIVE
Nitrite: NEGATIVE
Protein, ur: NEGATIVE mg/dL
Specific Gravity, Urine: 1.026 (ref 1.005–1.030)
Squamous Epithelial / LPF: NONE SEEN (ref 0–5)
pH: 6 (ref 5.0–8.0)

## 2018-06-13 LAB — CBC WITH DIFFERENTIAL/PLATELET
Abs Immature Granulocytes: 0.05 10*3/uL (ref 0.00–0.07)
Basophils Absolute: 0 10*3/uL (ref 0.0–0.1)
Basophils Relative: 1 %
Eosinophils Absolute: 0 10*3/uL (ref 0.0–0.5)
Eosinophils Relative: 1 %
HCT: 44.8 % (ref 39.0–52.0)
Hemoglobin: 14.5 g/dL (ref 13.0–17.0)
Immature Granulocytes: 1 %
Lymphocytes Relative: 23 %
Lymphs Abs: 1.9 10*3/uL (ref 0.7–4.0)
MCH: 28.7 pg (ref 26.0–34.0)
MCHC: 32.4 g/dL (ref 30.0–36.0)
MCV: 88.5 fL (ref 80.0–100.0)
Monocytes Absolute: 0.5 10*3/uL (ref 0.1–1.0)
Monocytes Relative: 6 %
Neutro Abs: 5.7 10*3/uL (ref 1.7–7.7)
Neutrophils Relative %: 68 %
Platelets: 268 10*3/uL (ref 150–400)
RBC: 5.06 MIL/uL (ref 4.22–5.81)
RDW: 13.8 % (ref 11.5–15.5)
WBC: 8.2 10*3/uL (ref 4.0–10.5)
nRBC: 0 % (ref 0.0–0.2)

## 2018-06-13 LAB — BASIC METABOLIC PANEL
Anion gap: 11 (ref 5–15)
BUN: 14 mg/dL (ref 6–20)
CO2: 21 mmol/L — ABNORMAL LOW (ref 22–32)
Calcium: 8.2 mg/dL — ABNORMAL LOW (ref 8.9–10.3)
Chloride: 103 mmol/L (ref 98–111)
Creatinine, Ser: 0.77 mg/dL (ref 0.61–1.24)
GFR calc Af Amer: >60 mL/min (ref >60)
GFR calc non Af Amer: >60 mL/min (ref >60)
Glucose, Bld: 452 mg/dL — ABNORMAL HIGH (ref 70–99)
Potassium: 4.4 mmol/L (ref 3.5–5.1)
Sodium: 135 mmol/L (ref 135–145)

## 2018-06-13 LAB — COMPREHENSIVE METABOLIC PANEL
ALT: 98 U/L — ABNORMAL HIGH (ref 0–44)
AST: 116 U/L — ABNORMAL HIGH (ref 15–41)
Albumin: 4 g/dL (ref 3.5–5.0)
Alkaline Phosphatase: 218 U/L — ABNORMAL HIGH (ref 38–126)
Anion gap: 27 — ABNORMAL HIGH (ref 5–15)
BUN: 16 mg/dL (ref 6–20)
CO2: 14 mmol/L — ABNORMAL LOW (ref 22–32)
Calcium: 9 mg/dL (ref 8.9–10.3)
Chloride: 88 mmol/L — ABNORMAL LOW (ref 98–111)
Creatinine, Ser: 1.13 mg/dL (ref 0.61–1.24)
GFR calc Af Amer: >60 mL/min (ref >60)
GFR calc non Af Amer: >60 mL/min (ref >60)
Glucose, Bld: 765 mg/dL (ref 70–99)
Potassium: 4.9 mmol/L (ref 3.5–5.1)
Sodium: 129 mmol/L — ABNORMAL LOW (ref 135–145)
Total Bilirubin: 2.6 mg/dL — ABNORMAL HIGH (ref 0.3–1.2)
Total Protein: 8.2 g/dL — ABNORMAL HIGH (ref 6.5–8.1)

## 2018-06-13 LAB — GLUCOSE, CAPILLARY
Glucose-Capillary: >600 mg/dL (ref 70–99)
Glucose-Capillary: 585 mg/dL (ref 70–99)
Glucose-Capillary: 413 mg/dL — ABNORMAL HIGH (ref 70–99)
Glucose-Capillary: 321 mg/dL — ABNORMAL HIGH (ref 70–99)
Glucose-Capillary: 241 mg/dL — ABNORMAL HIGH (ref 70–99)
Glucose-Capillary: 461 mg/dL — ABNORMAL HIGH (ref 70–99)

## 2018-06-13 LAB — LACTIC ACID, PLASMA: Lactic Acid, Venous: 1.7 mmol/L (ref 0.5–1.9)

## 2018-06-13 MED ORDER — FENTANYL CITRATE (PF) 100 MCG/2ML IJ SOLN
50.0000 ug | INTRAMUSCULAR | Status: DC | PRN
  Administered 2018-06-13 (×4): 50 ug via INTRAVENOUS
  Filled 2018-06-13 (×4): qty 2

## 2018-06-13 MED ORDER — INSULIN REGULAR BOLUS VIA INFUSION
0.0000 [IU] | Freq: Three times a day (TID) | INTRAVENOUS | Status: DC
  Filled 2018-06-13: qty 10

## 2018-06-13 MED ORDER — VANCOMYCIN HCL 10 G IV SOLR: 1000.0000 mg | Freq: Once | INTRAVENOUS | Status: DC

## 2018-06-13 MED ORDER — VANCOMYCIN HCL IN DEXTROSE 1-5 GM/200ML-% IV SOLN
INTRAVENOUS | Status: AC
  Administered 2018-06-13: 1000 mg via INTRAVENOUS
  Filled 2018-06-13: qty 200

## 2018-06-13 MED ORDER — KETOROLAC TROMETHAMINE 30 MG/ML IJ SOLN: 15.0000 mg | Freq: Once | INTRAMUSCULAR | Status: DC

## 2018-06-13 MED ORDER — DEXTROSE 50 % IV SOLN: 25.0000 mL | INTRAVENOUS | Status: DC | PRN

## 2018-06-13 MED ORDER — IOPAMIDOL (ISOVUE-300) INJECTION 61%
100.0000 mL | Freq: Once | INTRAVENOUS | Status: AC | PRN
  Administered 2018-06-13: 100 mL via INTRAVENOUS

## 2018-06-13 MED ORDER — BUPIVACAINE HCL (PF) 0.5 % IJ SOLN
30.0000 mL | Freq: Once | INTRAMUSCULAR | Status: AC
  Administered 2018-06-13: 30 mL
  Filled 2018-06-13: qty 30

## 2018-06-13 MED ORDER — SODIUM CHLORIDE 0.9 % IV SOLN
INTRAVENOUS | Status: DC
  Administered 2018-06-13: 100 mL/h via INTRAVENOUS

## 2018-06-13 MED ORDER — SODIUM CHLORIDE 0.9 % IV BOLUS: 1000.0000 mL | Freq: Once | INTRAVENOUS | Status: DC

## 2018-06-13 MED ORDER — INSULIN REGULAR(HUMAN) IN NACL 100-0.9 UT/100ML-% IV SOLN
INTRAVENOUS | Status: DC
  Administered 2018-06-13: 5.3 [IU]/h via INTRAVENOUS
  Filled 2018-06-13: qty 100

## 2018-06-13 MED ORDER — DEXTROSE-NACL 5-0.45 % IV SOLN
INTRAVENOUS | Status: DC
  Administered 2018-06-13: 19:00:00 via INTRAVENOUS

## 2018-06-13 MED ORDER — VANCOMYCIN HCL IN DEXTROSE 1-5 GM/200ML-% IV SOLN
1000.0000 mg | Freq: Once | INTRAVENOUS | Status: AC
  Administered 2018-06-13: 1000 mg via INTRAVENOUS

## 2018-06-13 MED ORDER — SODIUM CHLORIDE 0.9 % IV SOLN
1.0000 g | Freq: Once | INTRAVENOUS | Status: AC
  Administered 2018-06-13: 1 g via INTRAVENOUS
  Filled 2018-06-13: qty 1

## 2018-06-13 NOTE — ED Triage Notes (Addendum)
Pt presents to ED via [facility] with c/o hyperglycemia, per EMS pt CBG on arrival was 474, BP 140/89, 89, 99% on RA. Pt arrives alert and oriented at this time. Pt states last dose of insulin was 23 units of 70/30. Pt states CBG have been high for several days. Pt also with knot to posterior L arm that he would like evaluated.

## 2018-06-13 NOTE — ED Notes (Signed)
Report to [facility].  

## 2018-06-13 NOTE — ED Notes (Signed)
[facility] here 

## 2018-06-13 NOTE — ED Notes (Signed)
Pt gven meal tray

## 2018-06-13 NOTE — ED Notes (Signed)
CT needed an IV for CT contrast. Paused vanc and IVF on R hand IV for CT. Insulin remains going through L FA IV.

## 2018-06-13 NOTE — ED Notes (Signed)
Pt updated on transfer process, clothing, ball cap,  coat and shoes placed in belonging bag.

## 2018-06-13 NOTE — ED Notes (Signed)
Pt states he has lower left abd pain with N/V. Pt has swollen bump to left upper arm, states the bump itches intermittently.

## 2018-06-13 NOTE — ED Notes (Signed)
Rate/dose change verified with Lenise ArenaKate RN

## 2018-06-13 NOTE — ED Provider Notes (Signed)
[facility] Emergency Department Provider Note    First MD Initiated Contact with Patient 06/13/18 1326     (approximate)  I have reviewed the triage vital signs and the nursing notes.   HISTORY  Chief Complaint Hyperglycemia    HPI Trevor Brown is a 27 y.o. male well-known to this facility with history of insulin-dependent diabetes and frequent admission for DKA as well as recent evaluation which evaluate patient had a left forearm abscess resulting and consequently developing pyomyositis requiring transfer to [facility].  Patient presents with persistent hyperglycemia and symptoms consistent with DKA including abdominal pain nausea and vomiting as well as boil to the left posterior arm where he injects insulin.    Past Medical History:  Diagnosis Date  . Diabetes mellitus without complication (HCC)   . Heart attack (HCC)    Pt claims he had heart attack a year ago ( 2015)- and was admitted in [facility] for that, but not given any meds or angiogram, on review of chart- I could not find any details like that.  . Hepatitis C, acute may 2016  . Hypertension    Family History  Problem Relation Age of Onset  . Cirrhosis Mother   . Diabetes Mellitus II Maternal Grandmother   . Diabetes Mellitus II Maternal Grandfather    Past Surgical History:  Procedure Laterality Date  . none     Patient Active Problem List   Diagnosis Date Noted  . Abdominal pain 06/29/2017  . Hyponatremia 06/29/2017  . DKA (diabetic ketoacidosis) (HCC) 06/29/2017  . Dental erosion extending into pulp 12/31/2016  . 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 Elevated transaminase level 03/27/2016  . Diabetes mellitus type 1 (HCC) 03/27/2016  . Tobacco abuse counseling 03/27/2016  . Transaminitis 03/25/2016  . Tobacco abuse 01/10/2016  . Type 1 diabetes mellitus with hyperglycemia (HCC) 12/12/2015  . Cocaine abuse (HCC) 12/12/2015  . DKA (diabetic ketoacidoses) (HCC) 06/10/2015  . Hidradenitis  suppurativa of left axilla   . DKA, type 1 (HCC) 06/06/2015  . Malnutrition of moderate degree (HCC) 04/08/2015  . Hepatitis C 12/13/2014  . Diabetes type 1, uncontrolled (HCC) 12/11/2014  . Major depressive disorder, single episode, mild (HCC)   . Major depression, single episode 12/10/2014  . Diabetes mellitus type 1, uncontrolled (HCC) 12/09/2014  . Homelessness 12/09/2014      Prior to Admission medications   Medication Sig Start Date End Date Taking? Authorizing Provider  insulin aspart (NOVOLOG) 100 UNIT/ML injection Inject 5-10 Units into the skin See admin instructions. INJECT BEFORE MEALS PER SLIDING SCALE AS DIRECTED: 120-250 = 5 UNITS;251-350 = 8 UNITS; OVER 350 = 10 UNITS 05/20/18  Yes Doles-Johnson, Teah, NP  insulin aspart protamine- aspart (NOVOLOG MIX 70/30) (70-30) 100 UNIT/ML injection INJECT 28 UNITS UNDER THE SKIN WITH BREAKFAST AND 22 UNITS WITH DINNER Patient taking differently: Inject 23 Units into the skin See admin instructions. 22 units every morning and 23 units at bedtime 03/31/18  Yes Doles-Johnson, Teah, NP  doxycycline (VIBRAMYCIN) 100 MG capsule Take 1 capsule (100 mg total) by mouth 2 (two) times daily. Patient not taking: Reported on 06/13/2018 04/21/18   Sharman Cheek, MD    Allergies Bee venom; Penicillins; Ibuprofen; Tramadol; and Vancomycin    Social History Social History   Tobacco Use  . Smoking status: Current Every Day Smoker    Packs/day: 2.00    Types: Cigarettes  . Smokeless tobacco: Never Used  Substance Use Topics  . Alcohol use: No  Alcohol/week: 0.0 standard drinks  . 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 ____________________________________________   PHYSICAL EXAM:  VITAL SIGNS: Vitals:   06/13/18 1521 06/13/18 1530  BP: 131/77 111/63  Pulse: 72 60  Resp: (!) 24 (!) 22  Temp:    SpO2: 98% 99%    Constitutional: Alert and oriented. Ill and cachectic appearing Eyes: Conjunctivae are normal.  Head: Atraumatic. Nose: No congestion/rhinnorhea. Mouth/Throat: Mucous membranes are moist.   Neck: No stridor. Painless ROM.  Cardiovascular: Normal rate, regular rhythm. Grossly normal heart sounds.  Good peripheral circulation. Respiratory: Normal respiratory effort.  No retractions. Lungs CTAB. Gastrointestinal: Soft and nontender. No distention. No abdominal bruits. No CVA tenderness. Genitourinary:  Musculoskeletal: No lower extremity tenderness nor edema.  No joint effusions.  Tender fluctuant mass roughly the size of a tennis ball on the left posterior arm small area of overlying cellulitis. Neurologic:  Normal speech and language. No gross focal neurologic deficits are appreciated. No facial droop Skin:  Skin is warm, dry and intact. No rash noted. Psychiatric: Mood and affect are normal. Speech and behavior are normal.  ____________________________________________   LABS (all labs ordered are listed, but only abnormal results are displayed)  Results for orders placed or performed during the hospital encounter of 06/13/18 (from the past 24 hour(s))  Glucose, capillary     Status: Abnormal   Collection Time: 06/13/18  1:21 PM  Result Value Ref Range   Glucose-Capillary >600 (HH) 70 - 99 mg/dL  CBC with Differential/Platelet     Status: None   Collection Time: 06/13/18  1:46 PM  Result Value Ref Range   WBC 8.2 4.0 - 10.5 K/uL   RBC 5.06 4.22 - 5.81 MIL/uL   Hemoglobin 14.5 13.0 - 17.0 g/dL   HCT 16.1 09.6 - 04.5 %   MCV 88.5 80.0 - 100.0 fL   MCH 28.7 26.0 - 34.0 pg   MCHC 32.4 30.0 - 36.0 g/dL   RDW 40.9 81.1 - 91.4 %   Platelets 268 150 - 400 K/uL   nRBC 0.0 0.0 -  0.2 %   Neutrophils Relative % 68 %   Neutro Abs 5.7 1.7 - 7.7 K/uL   Lymphocytes Relative 23 %   Lymphs Abs 1.9 0.7 - 4.0 K/uL   Monocytes Relative 6 %   Monocytes Absolute 0.5 0.1 - 1.0 K/uL   Eosinophils Relative 1 %   Eosinophils Absolute 0.0 0.0 - 0.5 K/uL   Basophils Relative 1 %   Basophils Absolute 0.0 0.0 - 0.1 K/uL   Immature Granulocytes 1 %   Abs Immature Granulocytes 0.05 0.00 - 0.07 K/uL  Comprehensive metabolic panel     Status: Abnormal   Collection Time: 06/13/18  1:46 PM  Result Value Ref Range   Sodium 129 (L) 135 - 145 mmol/L   Potassium 4.9 3.5 - 5.1 mmol/L   Chloride 88 (L) 98 - 111 mmol/L   CO2 14 (L) 22 - 32 mmol/L   Glucose, Bld 765 (HH) 70 - 99 mg/dL   BUN 16 6 - 20 mg/dL   Creatinine, Ser 7.82 0.61 - 1.24 mg/dL   Calcium 9.0 8.9 - 95.6 mg/dL   Total Protein 8.2 (H) 6.5 -  8.1 g/dL   Albumin 4.0 3.5 - 5.0 g/dL   AST 161 (H) 15 - 41 U/L   ALT 98 (H) 0 - 44 U/L   Alkaline Phosphatase 218 (H) 38 - 126 U/L   Total Bilirubin 2.6 (H) 0.3 - 1.2 mg/dL   GFR calc non Af Amer >60 >60 mL/min   GFR calc Af Amer >60 >60 mL/min   Anion gap 27 (H) 5 - 15  Urinalysis, Complete w Microscopic     Status: Abnormal   Collection Time: 06/13/18  1:46 PM  Result Value Ref Range   Color, Urine COLORLESS (A) YELLOW   APPearance CLEAR (A) CLEAR   Specific Gravity, Urine 1.026 1.005 - 1.030   pH 6.0 5.0 - 8.0   Glucose, UA >=500 (A) NEGATIVE mg/dL   Hgb urine dipstick MODERATE (A) NEGATIVE   Bilirubin Urine NEGATIVE NEGATIVE   Ketones, ur 80 (A) NEGATIVE mg/dL   Protein, ur NEGATIVE NEGATIVE mg/dL   Nitrite NEGATIVE NEGATIVE   Leukocytes, UA NEGATIVE NEGATIVE   RBC / HPF 6-10 0 - 5 RBC/hpf   WBC, UA 11-20 0 - 5 WBC/hpf   Bacteria, UA NONE SEEN NONE SEEN   Squamous Epithelial / LPF NONE SEEN 0 - 5   Mucus PRESENT   Lactic acid, plasma     Status: None   Collection Time: 06/13/18  1:46 PM  Result Value Ref Range   Lactic Acid, Venous 1.7 0.5 - 1.9 mmol/L  Glucose,  capillary     Status: Abnormal   Collection Time: 06/13/18  3:17 PM  Result Value Ref Range   Glucose-Capillary 585 (HH) 70 - 99 mg/dL   Comment 1 Notify RN    Comment 2 Document in Chart    ____________________________________________  EKG My review and personal interpretation at Time: 14:39   Indication: dka  Rate: 70  Rhythm: sinus Axis: normal Other: concave upwards st segments, no stemi, hyperacute t waves, abn ekg ____________________________________________  RADIOLOGY  I personally reviewed all radiographic images ordered to evaluate for the above acute complaints and reviewed radiology reports and findings.  These findings were personally discussed with the patient.  Please see medical record for radiology report.  ____________________________________________   PROCEDURES  Procedure(s) performed:  .Critical Care Performed by: Willy Eddy, MD Authorized by: Willy Eddy, MD   Critical care provider statement:    Critical care time (minutes):  40   Critical care time was exclusive of:  Separately billable procedures and treating other patients   Critical care was necessary to treat or prevent imminent or life-threatening deterioration of the following conditions:  Metabolic crisis and endocrine crisis   Critical care was time spent personally by me on the following activities:  Development of treatment plan with patient or surrogate, discussions with consultants, evaluation of patient's response to treatment, examination of patient, obtaining history from patient or surrogate, ordering and performing treatments and interventions, ordering and review of laboratory studies, ordering and review of radiographic studies, pulse oximetry, re-evaluation of patient's condition and review of old charts .Marland KitchenIncision and Drainage Date/Time: 06/13/2018 2:22 PM Performed by: Willy Eddy, MD Authorized by: Willy Eddy, MD   Consent:    Consent obtained:  Verbal    Consent given by:  Patient   Risks discussed:  Bleeding, infection, incomplete drainage and pain   Alternatives discussed:  Alternative treatment, delayed treatment and observation Location:    Type:  Abscess   Location:  Upper extremity   Upper extremity location:  Arm   Arm location:  L upper arm Pre-procedure details:    Skin preparation:  Betadine Anesthesia (see MAR for exact dosages):    Anesthesia method:  Local infiltration   Local anesthetic:  Bupivacaine 0.5% w/o epi Procedure type:    Complexity:  Complex Procedure details:    Incision types:  Stab incision   Incision depth:  Subcutaneous   Scalpel blade:  11   Wound management:  Probed and deloculated, irrigated with saline and extensive cleaning   Drainage:  Purulent   Drainage amount:  Copious   Wound treatment:  Wound left open and drain placed   Packing materials:  1/2 in iodoform gauze Post-procedure details:    Patient tolerance of procedure:  Tolerated well, no immediate complications      Critical Care performed: yes ____________________________________________   INITIAL IMPRESSION / ASSESSMENT AND PLAN / ED COURSE  Pertinent labs & imaging results that were available during my care of the patient were reviewed by me and considered in my medical decision making (see chart for details).   DDX:  Dka, hhns, abscess, cellulitis, nec fasc   Emeline Darlingony B Foucher is a 27 y.o. who presents to the ED with his as described above.  Patient appears about at his baseline.  Does have large fluctuant abscess in the left posterior arm.  No crepitus.  Ultrasound shows evidence of abscess.  Not clinically consistent with neck fashion.  No pain out of proportion.  Will check blood work to evaluate for DKA and will perform I&D.  Clinical Course as of Jun 13 1553  Sat Jun 13, 2018  1439 Patient tolerated I&D after removal of copious purulent material repeat ultrasound does not show any evidence of persistent fluid collection.   This abscess seem to be contained in subcutaneous region.  Less consistent with pyomyositis at this time.  Patient will be started on broad-spectrum antibiotics.  Wound culture was sent for further species delineation but most recent culture did test positive for methicillin susceptible staph species.  Patient does have evidence of DKA and will require hospitalization for IV fluids, insulin management further medical evaluation.   [PR]  1452 No ICU or stepdown beds available at this facility [facility].   [PR]  1536 His case with Dr. Veverly Fellsonahoe of intensive care at [facility] who kindly accept patient but prior to formal acceptance as requested CT imaging of the upper extremity to exclude pyomyositis as they have limited surgical management at that time.  I think this is reasonable given his complex past medical history.  He remains Heema dynamically stable.   [PR]    Clinical Course User Index [PR] Willy Eddyobinson, Sigrid Schwebach, MD     As part of my medical decision making, I reviewed the following data within the electronic MEDICAL RECORD NUMBER Nursing notes reviewed and incorporated, Labs reviewed, notes from prior ED visits.  ____________________________________________   FINAL CLINICAL IMPRESSION(S) / ED DIAGNOSES  Final diagnoses:  Diabetic ketoacidosis without coma associated with type 1 diabetes mellitus (HCC)  Abscess of left arm      NEW MEDICATIONS STARTED DURING THIS VISIT:  New Prescriptions   No medications on file     Note:  This document was prepared using Dragon voice recognition software and may include unintentional dictation errors.    Willy Eddyobinson, Anabeth Chilcott, MD 06/13/18 [phone]

## 2018-06-13 NOTE — ED Notes (Signed)
Per Dr. Roxan Hockeyobinson he wants lactic sent to lab, not to be done as I-stat.

## 2018-06-17 LAB — AEROBIC CULTURE  (SUPERFICIAL SPECIMEN)

## 2018-06-17 LAB — AEROBIC CULTURE W GRAM STAIN (SUPERFICIAL SPECIMEN)

## 2018-08-17 ENCOUNTER — Emergency Department: Payer: Self-pay

## 2018-08-17 ENCOUNTER — Other Ambulatory Visit: Payer: Self-pay

## 2018-08-17 ENCOUNTER — Inpatient Hospital Stay
Admission: EM | Admit: 2018-08-17 | Discharge: 2018-08-22 | DRG: 638 | Disposition: A | Discharge status: Home / Self Care | Payer: Self-pay | Attending: Internal Medicine | Admitting: Internal Medicine

## 2018-08-17 ENCOUNTER — Encounter: Payer: Self-pay | Admitting: Emergency Medicine

## 2018-08-17 DIAGNOSIS — F17210 Nicotine dependence, cigarettes, uncomplicated: Secondary | ICD-10-CM | POA: Diagnosis present

## 2018-08-17 DIAGNOSIS — E0810 Diabetes mellitus due to underlying condition with ketoacidosis without coma: Secondary | ICD-10-CM

## 2018-08-17 DIAGNOSIS — Z881 Allergy status to other antibiotic agents status: Secondary | ICD-10-CM

## 2018-08-17 DIAGNOSIS — E875 Hyperkalemia: Secondary | ICD-10-CM | POA: Diagnosis not present

## 2018-08-17 DIAGNOSIS — E108 Type 1 diabetes mellitus with unspecified complications: Secondary | ICD-10-CM

## 2018-08-17 DIAGNOSIS — Z880 Allergy status to penicillin: Secondary | ICD-10-CM

## 2018-08-17 DIAGNOSIS — Z885 Allergy status to narcotic agent status: Secondary | ICD-10-CM

## 2018-08-17 DIAGNOSIS — L02512 Cutaneous abscess of left hand: Secondary | ICD-10-CM | POA: Diagnosis present

## 2018-08-17 DIAGNOSIS — L03114 Cellulitis of left upper limb: Secondary | ICD-10-CM | POA: Diagnosis present

## 2018-08-17 DIAGNOSIS — L03119 Cellulitis of unspecified part of limb: Secondary | ICD-10-CM

## 2018-08-17 DIAGNOSIS — E871 Hypo-osmolality and hyponatremia: Secondary | ICD-10-CM | POA: Diagnosis present

## 2018-08-17 DIAGNOSIS — Z794 Long term (current) use of insulin: Secondary | ICD-10-CM

## 2018-08-17 DIAGNOSIS — L02414 Cutaneous abscess of left upper limb: Secondary | ICD-10-CM | POA: Diagnosis present

## 2018-08-17 DIAGNOSIS — Z886 Allergy status to analgesic agent status: Secondary | ICD-10-CM

## 2018-08-17 DIAGNOSIS — B1920 Unspecified viral hepatitis C without hepatic coma: Secondary | ICD-10-CM | POA: Diagnosis present

## 2018-08-17 DIAGNOSIS — L02519 Cutaneous abscess of unspecified hand: Secondary | ICD-10-CM

## 2018-08-17 DIAGNOSIS — Z91030 Bee allergy status: Secondary | ICD-10-CM

## 2018-08-17 DIAGNOSIS — E1110 Type 2 diabetes mellitus with ketoacidosis without coma: Secondary | ICD-10-CM | POA: Diagnosis present

## 2018-08-17 DIAGNOSIS — Z9114 Patient's other noncompliance with medication regimen: Secondary | ICD-10-CM

## 2018-08-17 DIAGNOSIS — E10649 Type 1 diabetes mellitus with hypoglycemia without coma: Secondary | ICD-10-CM | POA: Diagnosis not present

## 2018-08-17 DIAGNOSIS — E1010 Type 1 diabetes mellitus with ketoacidosis without coma: Principal | ICD-10-CM | POA: Diagnosis present

## 2018-08-17 DIAGNOSIS — Z833 Family history of diabetes mellitus: Secondary | ICD-10-CM

## 2018-08-17 DIAGNOSIS — I252 Old myocardial infarction: Secondary | ICD-10-CM

## 2018-08-17 DIAGNOSIS — I10 Essential (primary) hypertension: Secondary | ICD-10-CM | POA: Diagnosis present

## 2018-08-17 LAB — CBC WITH DIFFERENTIAL/PLATELET
Abs Immature Granulocytes: 0.04 10*3/uL (ref 0.00–0.07)
Basophils Absolute: 0.1 10*3/uL (ref 0.0–0.1)
Basophils Relative: 1 %
Eosinophils Absolute: 0.1 10*3/uL (ref 0.0–0.5)
Eosinophils Relative: 1 %
HCT: 42.8 % (ref 39.0–52.0)
Hemoglobin: 13.6 g/dL (ref 13.0–17.0)
Immature Granulocytes: 1 %
Lymphocytes Relative: 20 %
Lymphs Abs: 1.8 10*3/uL (ref 0.7–4.0)
MCH: 28.9 pg (ref 26.0–34.0)
MCHC: 31.8 g/dL (ref 30.0–36.0)
MCV: 91.1 fL (ref 80.0–100.0)
Monocytes Absolute: 0.5 10*3/uL (ref 0.1–1.0)
Monocytes Relative: 6 %
Neutro Abs: 6.2 10*3/uL (ref 1.7–7.7)
Neutrophils Relative %: 71 %
Platelets: 326 10*3/uL (ref 150–400)
RBC: 4.7 MIL/uL (ref 4.22–5.81)
RDW: 14 % (ref 11.5–15.5)
WBC: 8.6 10*3/uL (ref 4.0–10.5)
nRBC: 0 % (ref 0.0–0.2)

## 2018-08-17 LAB — GLUCOSE, CAPILLARY
Glucose-Capillary: 564 mg/dL (ref 70–99)
Glucose-Capillary: 462 mg/dL — ABNORMAL HIGH (ref 70–99)
Glucose-Capillary: 512 mg/dL (ref 70–99)
Glucose-Capillary: 441 mg/dL — ABNORMAL HIGH (ref 70–99)
Glucose-Capillary: 295 mg/dL — ABNORMAL HIGH (ref 70–99)
Glucose-Capillary: 221 mg/dL — ABNORMAL HIGH (ref 70–99)
Glucose-Capillary: 136 mg/dL — ABNORMAL HIGH (ref 70–99)
Glucose-Capillary: 237 mg/dL — ABNORMAL HIGH (ref 70–99)

## 2018-08-17 LAB — COMPREHENSIVE METABOLIC PANEL
ALT: 166 U/L — ABNORMAL HIGH (ref 0–44)
AST: 188 U/L — ABNORMAL HIGH (ref 15–41)
Albumin: 4 g/dL (ref 3.5–5.0)
Alkaline Phosphatase: 247 U/L — ABNORMAL HIGH (ref 38–126)
Anion gap: 24 — ABNORMAL HIGH (ref 5–15)
BUN: 17 mg/dL (ref 6–20)
CO2: 15 mmol/L — ABNORMAL LOW (ref 22–32)
Calcium: 9 mg/dL (ref 8.9–10.3)
Chloride: 89 mmol/L — ABNORMAL LOW (ref 98–111)
Creatinine, Ser: 1.13 mg/dL (ref 0.61–1.24)
GFR calc Af Amer: >60 mL/min (ref >60)
GFR calc non Af Amer: >60 mL/min (ref >60)
Glucose, Bld: 565 mg/dL (ref 70–99)
Potassium: 4.9 mmol/L (ref 3.5–5.1)
Sodium: 128 mmol/L — ABNORMAL LOW (ref 135–145)
Total Bilirubin: 2 mg/dL — ABNORMAL HIGH (ref 0.3–1.2)
Total Protein: 8.7 g/dL — ABNORMAL HIGH (ref 6.5–8.1)

## 2018-08-17 LAB — BLOOD GAS, VENOUS
Acid-base deficit: 11.3 mmol/L — ABNORMAL HIGH (ref 0.0–2.0)
Bicarbonate: 14.1 mmol/L — ABNORMAL LOW (ref 20.0–28.0)
O2 Saturation: 71.4 %
Patient temperature: 37
pCO2, Ven: 30 mmHg — ABNORMAL LOW (ref 44.0–60.0)
pH, Ven: 7.28 (ref 7.250–7.430)
pO2, Ven: 43 mmHg (ref 32.0–45.0)

## 2018-08-17 LAB — BASIC METABOLIC PANEL
Anion gap: 18 — ABNORMAL HIGH (ref 5–15)
Anion gap: 8 (ref 5–15)
BUN: 16 mg/dL (ref 6–20)
BUN: 14 mg/dL (ref 6–20)
CO2: 18 mmol/L — ABNORMAL LOW (ref 22–32)
CO2: 23 mmol/L (ref 22–32)
Calcium: 8 mg/dL — ABNORMAL LOW (ref 8.9–10.3)
Calcium: 8.1 mg/dL — ABNORMAL LOW (ref 8.9–10.3)
Chloride: 92 mmol/L — ABNORMAL LOW (ref 98–111)
Chloride: 103 mmol/L (ref 98–111)
Creatinine, Ser: 0.92 mg/dL (ref 0.61–1.24)
Creatinine, Ser: 0.71 mg/dL (ref 0.61–1.24)
GFR calc Af Amer: >60 mL/min (ref >60)
GFR calc Af Amer: >60 mL/min (ref >60)
GFR calc non Af Amer: >60 mL/min (ref >60)
GFR calc non Af Amer: >60 mL/min (ref >60)
Glucose, Bld: 446 mg/dL — ABNORMAL HIGH (ref 70–99)
Glucose, Bld: 143 mg/dL — ABNORMAL HIGH (ref 70–99)
Potassium: 4.8 mmol/L (ref 3.5–5.1)
Potassium: 3.8 mmol/L (ref 3.5–5.1)
Sodium: 128 mmol/L — ABNORMAL LOW (ref 135–145)
Sodium: 134 mmol/L — ABNORMAL LOW (ref 135–145)

## 2018-08-17 LAB — MRSA PCR SCREENING: MRSA by PCR: NEGATIVE

## 2018-08-17 LAB — LIPASE, BLOOD: Lipase: 22 U/L (ref 11–51)

## 2018-08-17 MED ORDER — DEXTROSE-NACL 5-0.45 % IV SOLN
INTRAVENOUS | Status: DC
  Administered 2018-08-17: 19:00:00 via INTRAVENOUS

## 2018-08-17 MED ORDER — INSULIN ASPART PROT & ASPART (70-30 MIX) 100 UNIT/ML ~~LOC~~ SUSP: 23.0000 [IU] | SUBCUTANEOUS | Status: DC

## 2018-08-17 MED ORDER — INSULIN REGULAR(HUMAN) IN NACL 100-0.9 UT/100ML-% IV SOLN
INTRAVENOUS | Status: DC
  Administered 2018-08-17 (×2): 4.5 [IU]/h via INTRAVENOUS

## 2018-08-17 MED ORDER — INSULIN ASPART PROT & ASPART (70-30 MIX) 100 UNIT/ML ~~LOC~~ SUSP
28.0000 [IU] | Freq: Every day | SUBCUTANEOUS | Status: DC
  Administered 2018-08-18: 28 [IU] via SUBCUTANEOUS
  Filled 2018-08-17: qty 10

## 2018-08-17 MED ORDER — POTASSIUM CHLORIDE 10 MEQ/100ML IV SOLN
10.0000 meq | INTRAVENOUS | Status: AC
  Administered 2018-08-17 (×2): 10 meq via INTRAVENOUS
  Filled 2018-08-17 (×2): qty 100

## 2018-08-17 MED ORDER — OXYCODONE-ACETAMINOPHEN 5-325 MG PO TABS
1.0000 | ORAL_TABLET | Freq: Four times a day (QID) | ORAL | Status: DC | PRN
  Administered 2018-08-17 – 2018-08-18 (×5): 1 via ORAL
  Filled 2018-08-17 (×6): qty 1

## 2018-08-17 MED ORDER — IOPAMIDOL (ISOVUE-300) INJECTION 61%: 30.0000 mL | Freq: Once | INTRAVENOUS | Status: DC

## 2018-08-17 MED ORDER — INSULIN ASPART 100 UNIT/ML ~~LOC~~ SOLN
5.0000 [IU] | Freq: Once | SUBCUTANEOUS | Status: AC
  Administered 2018-08-17: 5 [IU] via INTRAVENOUS
  Filled 2018-08-17: qty 1

## 2018-08-17 MED ORDER — ACETAMINOPHEN 325 MG PO TABS: 650.0000 mg | ORAL_TABLET | Freq: Four times a day (QID) | ORAL | Status: DC | PRN

## 2018-08-17 MED ORDER — POTASSIUM CHLORIDE 10 MEQ/100ML IV SOLN
10.0000 meq | INTRAVENOUS | Status: AC
  Administered 2018-08-17 – 2018-08-18 (×4): 10 meq via INTRAVENOUS
  Filled 2018-08-17 (×5): qty 100

## 2018-08-17 MED ORDER — LIDOCAINE HCL (PF) 1 % IJ SOLN
INTRAMUSCULAR | Status: AC
  Administered 2018-08-17: 12:00:00
  Filled 2018-08-17: qty 10

## 2018-08-17 MED ORDER — ENOXAPARIN SODIUM 40 MG/0.4ML ~~LOC~~ SOLN
40.0000 mg | SUBCUTANEOUS | Status: DC
  Filled 2018-08-17 (×4): qty 0.4

## 2018-08-17 MED ORDER — SODIUM CHLORIDE 0.9 % IV SOLN
1.0000 g | Freq: Once | INTRAVENOUS | Status: AC
  Administered 2018-08-17: 1 g via INTRAVENOUS
  Filled 2018-08-17: qty 10

## 2018-08-17 MED ORDER — VANCOMYCIN HCL IN DEXTROSE 1-5 GM/200ML-% IV SOLN
1000.0000 mg | Freq: Once | INTRAVENOUS | Status: AC
  Administered 2018-08-17: 1000 mg via INTRAVENOUS
  Filled 2018-08-17: qty 200

## 2018-08-17 MED ORDER — DOXYCYCLINE HYCLATE 100 MG PO TABS
100.0000 mg | ORAL_TABLET | Freq: Two times a day (BID) | ORAL | Status: DC
  Administered 2018-08-17 – 2018-08-20 (×7): 100 mg via ORAL
  Filled 2018-08-17 (×7): qty 1

## 2018-08-17 MED ORDER — MORPHINE SULFATE (PF) 4 MG/ML IV SOLN
4.0000 mg | Freq: Once | INTRAVENOUS | Status: AC
  Administered 2018-08-17: 4 mg via INTRAVENOUS
  Filled 2018-08-17: qty 1

## 2018-08-17 MED ORDER — INSULIN ASPART PROT & ASPART (70-30 MIX) 100 UNIT/ML ~~LOC~~ SUSP
22.0000 [IU] | Freq: Every day | SUBCUTANEOUS | Status: DC
  Administered 2018-08-17: 22 [IU] via SUBCUTANEOUS
  Filled 2018-08-17: qty 10

## 2018-08-17 MED ORDER — NICOTINE 21 MG/24HR TD PT24
21.0000 mg | MEDICATED_PATCH | Freq: Every day | TRANSDERMAL | Status: DC
  Administered 2018-08-17 – 2018-08-22 (×6): 21 mg via TRANSDERMAL
  Filled 2018-08-17 (×6): qty 1

## 2018-08-17 MED ORDER — SODIUM CHLORIDE 0.9 % IV BOLUS
1000.0000 mL | Freq: Once | INTRAVENOUS | Status: AC
  Administered 2018-08-17: 1000 mL via INTRAVENOUS

## 2018-08-17 MED ORDER — ONDANSETRON HCL 4 MG/2ML IJ SOLN
4.0000 mg | Freq: Once | INTRAMUSCULAR | Status: AC
  Administered 2018-08-17: 4 mg via INTRAVENOUS
  Filled 2018-08-17: qty 2

## 2018-08-17 MED ORDER — SODIUM CHLORIDE 0.9 % IV SOLN
INTRAVENOUS | Status: DC
  Administered 2018-08-17: 17:00:00 via INTRAVENOUS

## 2018-08-17 NOTE — H&P (Signed)
[employer] [facility] [place] at [facility]   PATIENT NAME: Trevor Brown    MR#:  [id number]  DATE OF BIRTH:  [birth year]  DATE OF ADMISSION:  08/17/2018  PRIMARY CARE PHYSICIAN: Patient, No Pcp Per   REQUESTING/REFERRING PHYSICIAN: Roxan Hockeyobinson  CHIEF COMPLAINT:   Chief Complaint  Patient presents with  . Hyperglycemia    HISTORY OF PRESENT ILLNESS: Trevor Brown  is a 28 y.o. male with a known history of diabetes mellitus without complications and recurrent DKA, hepatitis C, hypertension, IV drug abuse-claims that he ran out of his insulin 2 days ago and so did not took any insulin.  He also uses IV drugs and the last use was 4 days ago.  He had some swelling and infection with pus development in his left hand which is his injection site and also had pain and noted his sugar was high so came to emergency room. He was noted to be in DKA so ER physician started on IV fluids and insulin and given to hospitalist team for further management. For his left hand abscess, ER physician did localized I&D and send the cultures and gave to hospitalist team for further management. Patient denies any fever or chills at home.  PAST MEDICAL HISTORY:   Past Medical History:  Diagnosis Date  . Diabetes mellitus without complication (HCC)   . Heart attack (HCC)    Pt claims he had heart attack a year ago ( 2015)- and was admitted in [facility] for that, but not given any meds or angiogram, on review of chart- I could not find any details like that.  . Hepatitis C, acute may 2016  . Hypertension     PAST SURGICAL HISTORY:  Past Surgical History:  Procedure Laterality Date  . none      SOCIAL HISTORY:  Social History   Tobacco Use  . Smoking status: Current Every Day Smoker    Packs/day: 2.00    Types: Cigarettes  . Smokeless tobacco: Never Used  Substance Use Topics  . Alcohol use: No    Alcohol/week: 0.0 standard drinks    FAMILY HISTORY:  Family History  Problem Relation Age of Onset   . Cirrhosis Mother   . Diabetes Mellitus II Maternal Grandmother   . Diabetes Mellitus II Maternal Grandfather     DRUG ALLERGIES:  Allergies  Allergen Reactions  . Bee Venom Anaphylaxis  . Penicillins Anaphylaxis, Hives and Other (See Comments)    Has patient had a PCN reaction causing immediate rash, facial/tongue/throat swelling, SOB or lightheadedness with hypotension: Yes Has patient had a PCN reaction causing severe rash involving mucus membranes or skin necrosis: No Has patient had a PCN reaction that required hospitalization No Has patient had a PCN reaction occurring within the last 10 years: Yes If all of the above answers are "NO", then may proceed with Cephalosporin use.  . Ibuprofen Other (See Comments)    Other reaction(s): Other (See Comments) Stomach upset Reaction:  GI upset   . Tramadol Hives and Swelling  . Vancomycin Rash and Other (See Comments)    Reaction:  Red man's syndrome     REVIEW OF SYSTEMS:   CONSTITUTIONAL: No fever, fatigue or weakness.  EYES: No blurred or double vision.  EARS, NOSE, AND THROAT: No tinnitus or ear pain.  RESPIRATORY: No cough, shortness of breath, wheezing or hemoptysis.  CARDIOVASCULAR: No chest pain, orthopnea, edema.  GASTROINTESTINAL: No nausea, vomiting, diarrhea or abdominal pain.  GENITOURINARY: No dysuria, hematuria.  ENDOCRINE: No  polyuria, nocturia,  HEMATOLOGY: No anemia, easy bruising or bleeding SKIN: No rash or lesion. MUSCULOSKELETAL: No joint pain or arthritis.   NEUROLOGIC: No tingling, numbness, weakness.  PSYCHIATRY: No anxiety or depression.   MEDICATIONS AT HOME:  Prior to Admission medications   Medication Sig Start Date End Date Taking? Authorizing Provider  insulin aspart (NOVOLOG) 100 UNIT/ML injection Inject 5-10 Units into the skin See admin instructions. INJECT BEFORE MEALS PER SLIDING SCALE AS DIRECTED: 120-250 = 5 UNITS;251-350 = 8 UNITS; OVER 350 = 10 UNITS 05/20/18  Yes Doles-Johnson, Teah,  NP  insulin aspart protamine- aspart (NOVOLOG MIX 70/30) (70-30) 100 UNIT/ML injection INJECT 28 UNITS UNDER THE SKIN WITH BREAKFAST AND 22 UNITS WITH DINNER Patient taking differently: Inject 23 Units into the skin See admin instructions. 22 units every morning and 23 units at bedtime 03/31/18  Yes Doles-Johnson, Teah, NP  doxycycline (VIBRAMYCIN) 100 MG capsule Take 1 capsule (100 mg total) by mouth 2 (two) times daily. Patient not taking: Reported on 06/13/2018 04/21/18   Sharman Cheek, MD      PHYSICAL EXAMINATION:   VITAL SIGNS: Blood pressure 133/83, pulse 73, temperature 98.4 F (36.9 C), temperature source Oral, resp. rate (!) 21, height 5\' 4"  (1.626 m), weight 54.4 kg, SpO2 99 %.  GENERAL:  28 y.o.-year-old patient lying in the bed with no acute distress.  EYES: Pupils equal, round, reactive to light and accommodation. No scleral icterus. Extraocular muscles intact.  HEENT: Head atraumatic, normocephalic. Oropharynx and nasopharynx clear.  NECK:  Supple, no jugular venous distention. No thyroid enlargement, no tenderness.  LUNGS: Normal breath sounds bilaterally, no wheezing, rales,rhonchi or crepitation. No use of accessory muscles of respiration.  CARDIOVASCULAR: S1, S2 normal. No murmurs, rubs, or gallops.  ABDOMEN: Soft, nontender, nondistended. Bowel sounds present. No organomegaly or mass.  EXTREMITIES: No pedal edema, cyanosis, or clubbing.  Left hand dorsal aspect has dressing present status post I&D by ER physician.  Left antecubital area has an ulcerated wound which is red but no surrounding redness. NEUROLOGIC: Cranial nerves II through XII are intact. Muscle strength 5/5 in all extremities. Sensation intact. Gait not checked.  PSYCHIATRIC: The patient is alert and oriented x 3.  SKIN: No obvious rash, lesion, or ulcer.   LABORATORY PANEL:   CBC Recent Labs  Lab 08/17/18 1117  WBC 8.6  HGB 13.6  HCT 42.8  PLT 326  MCV 91.1  MCH 28.9  MCHC 31.8  RDW 14.0   LYMPHSABS 1.8  MONOABS 0.5  EOSABS 0.1  BASOSABS 0.1   ------------------------------------------------------------------------------------------------------------------  Chemistries  Recent Labs  Lab 08/17/18 1117 08/17/18 1442  NA 128* 128*  K 4.9 4.8  CL 89* 92*  CO2 15* 18*  GLUCOSE 565* 446*  BUN 17 16  CREATININE 1.13 0.92  CALCIUM 9.0 8.0*  AST 188*  --   ALT 166*  --   ALKPHOS 247*  --   BILITOT 2.0*  --    ------------------------------------------------------------------------------------------------------------------ estimated creatinine clearance is 94.4 mL/min (by C-G formula based on SCr of 0.92 mg/dL). ------------------------------------------------------------------------------------------------------------------ No results for input(s): TSH, T4TOTAL, T3FREE, THYROIDAB in the last 72 hours.  Invalid input(s): FREET3   Coagulation profile No results for input(s): INR, PROTIME in the last 168 hours. ------------------------------------------------------------------------------------------------------------------- No results for input(s): DDIMER in the last 72 hours. -------------------------------------------------------------------------------------------------------------------  Cardiac Enzymes No results for input(s): CKMB, TROPONINI, MYOGLOBIN in the last 168 hours.  Invalid input(s): CK ------------------------------------------------------------------------------------------------------------------ Invalid input(s): POCBNP  ---------------------------------------------------------------------------------------------------------------  Urinalysis    Component Value  Date/Time   COLORURINE COLORLESS (A) 06/13/2018 1346   APPEARANCEUR CLEAR (A) 06/13/2018 1346   APPEARANCEUR Clear 04/06/2014 0751   LABSPEC 1.026 06/13/2018 1346   LABSPEC 1.026 04/06/2014 0751   PHURINE 6.0 06/13/2018 1346   GLUCOSEU >=500 (A) 06/13/2018 1346   GLUCOSEU  >=500 04/06/2014 0751   HGBUR MODERATE (A) 06/13/2018 1346   BILIRUBINUR NEGATIVE 06/13/2018 1346   BILIRUBINUR Negative 04/06/2014 0751   KETONESUR 80 (A) 06/13/2018 1346   PROTEINUR NEGATIVE 06/13/2018 1346   NITRITE NEGATIVE 06/13/2018 1346   LEUKOCYTESUR NEGATIVE 06/13/2018 1346   LEUKOCYTESUR Negative 04/06/2014 0751     RADIOLOGY: Ct Abdomen Pelvis Wo Contrast  Result Date: 08/17/2018 CLINICAL DATA:  Seen in the ED for blood glucose of 565. Left abdominal pain also noted. Normal WBC. Patient in DKA has left lower quadrant pain and markedly elevated liver functions much higher than usual EXAM: CT ABDOMEN AND PELVIS WITHOUT CONTRAST TECHNIQUE: Multidetector CT imaging of the abdomen and pelvis was performed following the standard protocol without IV contrast. COMPARISON:  04/09/2016 FINDINGS: Lower chest: No pleural or pericardial effusion. Visualized lung bases clear. Hepatobiliary: No focal liver abnormality is seen. No gallstones, gallbladder wall thickening, or biliary dilatation. Pancreas: Unremarkable. No pancreatic ductal dilatation or surrounding inflammatory changes. Spleen: Normal in size without focal abnormality. Adrenals/Urinary Tract: Normal adrenal glands. 1 mm calcification in the lower pole right renal collecting system. No hydronephrosis. Urinary bladder physiologically distended. Stomach/Bowel: Stomach and small bowel are nondilated. Incomplete passage of oral contrast material through the distal small bowel. Moderate colonic fecal material without dilatation. Vascular/Lymphatic: No significant vascular findings are present. No enlarged abdominal or pelvic lymph nodes. Reproductive: Prostate is unremarkable. Other: No ascites. No free air. Musculoskeletal: No acute or significant osseous findings. IMPRESSION: 1. No acute abdominal process. 2. Stable right nephrolithiasis without hydronephrosis. Electronically Signed   By: Corlis Leak  Hassell M.D.   On: 08/17/2018 14:37    EKG: Orders  placed or performed during the hospital encounter of 08/17/18  . EKG 12-Lead  . EKG 12-Lead  . ED EKG  . ED EKG    IMPRESSION AND PLAN:  *DKA Due to noncompliance. Will start on insulin IV drip and admit to ICU for further management. IV fluid normal saline boluses for now and switch as per ICU DKA protocol.  *Hypo-natremia Due to DKA and hypoglycemia, continue to monitor with IV fluids.  *Abscess and cellulitis on left hand ER physician did drainage. Cultures from the drainage was sent. As patient is IV drug user, I will also get blood cultures to rule out endocarditis. Though patient denies any complaints of fevers or chills.  *Elevated LFTs and bilirubin This could be explained by his hepatitis C, continue to monitor.  *Tobacco abuse-active smoking Counseled against smoking for 4 minutes and offered nicotine patch.  *IV drug use Last use was 4 days ago, currently no withdrawal symptoms. I have counseled him against IV drug use and explained him about endocarditis risk and seriousness of that.  All the records are reviewed and case discussed with ED provider. Management plans discussed with the patient, family and they are in agreement.  CODE STATUS: Full code. Code Status History    Date Active Date Inactive Code Status Order ID Comments User Context   06/29/2017 1354 06/30/2017 1850 Full Code [id number]  Marguarite ArbourSparks, Jeffrey D, MD Inpatient   06/05/2017 0456 06/07/2017 2052 Full Code [id number]  Arnaldo Nataliamond, Michael S, MD Inpatient   06/04/2017 0259 06/05/2017 0456 Full Code [id number]  Ihor Austin, MD Inpatient   04/17/2017 1055 04/21/2017 1559 Full Code [id number]  Ramonita Lab, MD Inpatient   02/03/2017 1336 02/04/2017 2128 Full Code [id number]  Gracelyn Nurse, MD ED   10/13/2016 0526 10/15/2016 1812 Full Code [id number]  Arnaldo Natal, MD Inpatient   09/08/2016 0352 09/08/2016 1837 Full Code [id number]  Hugelmeyer, Crowley, DO Inpatient   08/12/2016 2220 08/13/2016 1710 Full Code  [id number]  Katharina Caper, MD Inpatient   07/04/2016 1224 07/05/2016 1936 Full Code [id number]  Altamese Dilling, MD Inpatient   07/04/2016 1224 07/04/2016 1224 Full Code [id number]  Altamese Dilling, MD Inpatient   04/09/2016 1654 04/11/2016 1409 Full Code [id number]  Katharina Caper, MD Inpatient   03/26/2016 0118 03/27/2016 1911 Full Code [id number]  Tonye Royalty, DO Inpatient   03/18/2016 0815 03/21/2016 2035 Full Code [id number]  Wyatt Haste, MD ED   03/12/2016 1220 03/16/2016 1927 Full Code [id number]  Enedina Finner, MD Inpatient   02/18/2016 1659 02/19/2016 1718 Full Code [id number]  Ramonita Lab, MD ED   01/12/2016 0858 01/15/2016 1933 Full Code [id number]  Milagros Loll, MD ED   01/11/2016 0035 01/11/2016 2320 Full Code [id number]  Gery Pray, MD Inpatient   12/12/2015 2204 12/14/2015 1838 Full Code [id number]  Katharina Caper, MD Inpatient   10/23/2015 1742 10/25/2015 1904 Full Code [id number]  Katha Hamming, MD ED   06/10/2015 0820 06/12/2015 1441 Full Code [id number]  Arnaldo Natal, MD Inpatient   06/06/2015 2043 06/07/2015 1620 Full Code [id number]  Enedina Finner, MD Inpatient   04/07/2015 1245 04/08/2015 2037 Full Code [id number]  Altamese Dilling, MD Inpatient   12/09/2014 1444 12/13/2014 1616 Full Code [id number]  Gale Journey, MD Inpatient       TOTAL TIME TAKING CARE OF THIS PATIENT: 50 critical care minutes.    Altamese Dilling M.D on 08/17/2018   Between 7am to 6pm - Pager - [phone]  After 6pm go to [email or web address] - password [email or web address]  [facility]  Office  [phone]  CC: Primary care physician; Patient, No Pcp Per   Note: This dictation was prepared with Dragon dictation along with smaller phrase technology. Any transcriptional errors that result from this process are unintentional.

## 2018-08-17 NOTE — Progress Notes (Addendum)
Pharmacy Electrolyte Monitoring Consult:  Pharmacy consulted to assist in monitoring and replacing electrolytes in this 28 y.o. male admitted on 08/17/2018 with Hyperglycemia  Patient on DKA insulin drip protocol.   NS currently infusing at 17425mL/hr.   Labs:  Sodium (mmol/L)  Date Value  08/17/2018 134 (L)  12/31/2016 138  04/06/2014 137   Potassium (mmol/L)  Date Value  08/17/2018 3.8  04/06/2014 3.8   Magnesium (mg/dL)  Date Value  16/10/960412/03/2017 2.1  12/12/2013 1.6 (L)   Phosphorus (mg/dL)  Date Value  54/09/811902/18/2018 3.6  12/10/2013 2.6   Calcium (mg/dL)  Date Value  14/78/295601/27/2020 8.1 (L)   Calcium, Total (mg/dL)  Date Value  21/30/865709/16/2015 8.1 (L)   Albumin (g/dL)  Date Value  84/69/629501/27/2020 4.0  12/31/2016 4.3  04/05/2014 3.4    Assessment/Plan: 01/27 @ 2030 K 3.8 down from 4.8. Will replace w/ another KCI 10 mEq IV x 4 and will continue monitoring w/ am labs.  Thomasene Rippleavid Bernd Crom, PharmD, BCPS Clinical Pharmacist 08/17/2018

## 2018-08-17 NOTE — ED Notes (Signed)
Called to give report to Maralyn Sago in the ICU and was asked to return call in 10 minutes

## 2018-08-17 NOTE — Consult Note (Addendum)
Name: Trevor Brown MRN: [id number] DOB: 1990-10-15     CONSULTATION DATE: 08/17/2018  REFERRING MD :  Altamese DillingVaibhavkumar Vachhani,  MD  CHIEF COMPLAINT:  Hyperglycemia  SIGNIFICANT EVENTS/STUDIES:  1/27 - Arrived at ED with hyperglycemia. Initial CBG 564 1/27 - CT Abd/Pelvis wo contast - Right stable nephrolithiasis w/o hydronephrosis  1/27 - I&D Left hand abscess 1/27 - Transferred to ICU for DKA management. Anion gap 21.9   HISTORY OF PRESENT ILLNESS:  Patient is a 28 y.o. male with past medical history of Type I Diabetes Mellitus without complications and recurrent DKA, Hepatitis C, Hypertension, IV Drug Abuse, presents to ED with hyperglycemia. He reports not feeling well and ran out of his insulin 2 days ago, and he arrived at ED on his own. He also reports RUQ abdominal pain with normal bowel movement and urination, and reports abscess on his Left hand and ulcerative skin on his Left arm for 2 weeks. He also reports being IV drug user and last IV drug use was 4 days ago. CT Abd/Pelvis wo contast shows Right stable nephrolithiasis w/o hydronephrosis. Initial labs in ED show CBG of 564, Sodium 128, Chloride 92, Bicarb 14.1. I&D performed in ED on his Left hand and sent to culture. He was given NS bolus in ED, and is subsequently transferred to ICU for DKA management.  PAST MEDICAL HISTORY :   has a past medical history of Diabetes mellitus without complication (HCC), Heart attack (HCC), Hepatitis C, acute (may 2016), and Hypertension.  has a past surgical history that includes none. Prior to Admission medications   Medication Sig Start Date End Date Taking? Authorizing Provider  insulin aspart (NOVOLOG) 100 UNIT/ML injection Inject 5-10 Units into the skin See admin instructions. INJECT BEFORE MEALS PER SLIDING SCALE AS DIRECTED: 120-250 = 5 UNITS;251-350 = 8 UNITS; OVER 350 = 10 UNITS 05/20/18  Yes Doles-Johnson, Teah, NP  insulin aspart protamine- aspart (NOVOLOG MIX 70/30) (70-30) 100  UNIT/ML injection INJECT 28 UNITS UNDER THE SKIN WITH BREAKFAST AND 22 UNITS WITH DINNER Patient taking differently: Inject 23 Units into the skin See admin instructions. 22 units every morning and 23 units at bedtime 03/31/18  Yes Doles-Johnson, Teah, NP  doxycycline (VIBRAMYCIN) 100 MG capsule Take 1 capsule (100 mg total) by mouth 2 (two) times daily. Patient not taking: Reported on 06/13/2018 04/21/18   Sharman CheekStafford, Phillip, MD   Allergies  Allergen Reactions  . Bee Venom Anaphylaxis  . Penicillins Anaphylaxis, Hives and Other (See Comments)    Has patient had a PCN reaction causing immediate rash, facial/tongue/throat swelling, SOB or lightheadedness with hypotension: Yes Has patient had a PCN reaction causing severe rash involving mucus membranes or skin necrosis: No Has patient had a PCN reaction that required hospitalization No Has patient had a PCN reaction occurring within the last 10 years: Yes If all of the above answers are "NO", then may proceed with Cephalosporin use.  . Ibuprofen Other (See Comments)    Other reaction(s): Other (See Comments) Stomach upset Reaction:  GI upset   . Tramadol Hives and Swelling  . Vancomycin Rash and Other (See Comments)    Reaction:  Red man's syndrome     FAMILY HISTORY:  family history includes Cirrhosis in his mother; Diabetes Mellitus II in his maternal grandfather and maternal grandmother. SOCIAL HISTORY:  reports that he has been smoking cigarettes. He has been smoking about 2.00 packs per day. He has never used smokeless tobacco. He reports current drug use. Drug: IV.  He reports that he does not drink alcohol.  REVIEW OF SYSTEMS:   Review of Systems  Constitutional: Positive for malaise/fatigue. Negative for chills and fever.  Eyes: Negative for blurred vision and pain.  Respiratory: Negative for cough, shortness of breath and wheezing.   Cardiovascular: Negative for chest pain, palpitations and leg swelling.  Gastrointestinal:  Positive for abdominal pain (RUQ). Negative for blood in stool, constipation, diarrhea, nausea and vomiting.  Genitourinary: Negative for dysuria and hematuria.  Neurological: Negative for dizziness and headaches.  Psychiatric/Behavioral: Positive for substance abuse (Current IV drug use).  All other systems reviewed and are negative.   VITAL SIGNS: Temp:  [98.4 F (36.9 C)] 98.4 F (36.9 C) (01/27 1038) Pulse Rate:  [64-86] 73 (01/27 1530) Resp:  [18-21] 21 (01/27 1530) BP: (126-133)/(77-86) 133/83 (01/27 1530) SpO2:  [99 %-100 %] 99 % (01/27 1530) Weight:  [54.4 kg] 54.4 kg (01/27 1039)  Physical Examination:  GENERAL: Ill appearing, in no acute distress HEAD: Normocephalic, atraumatic.  EYES: Pupils equal, round, reactive to light.  No scleral icterus.  MOUTH: Moist mucosal membrane. NECK: Supple. No JVD.  PULMONARY: Clear lung sound bilaterally CARDIOVASCULAR: S1 and S2. Regular rate and rhythm. No murmurs, rubs, or gallops.  GASTROINTESTINAL: Soft, nontender, -distended. No masses. Positive bowel sounds. No hepatosplenomegaly.  MUSCULOSKELETAL: No swelling, clubbing, or edema.  NEUROLOGIC: Awake, alert SKIN: Warm,dry. 2cm ulcerative skin at L medial elbow area. Post I&D wound L hand.  I personally reviewed lab work that was obtained in last 24 hrs.  CT Abd/Pelvic wo Contrast reviewed- 1. No acute abdominal process. 2. Stable right nephrolithiasis without hydronephrosis.   ASSESSMENT / PLAN:  Patient is a 28 y.o. presents to ICU with DKA with type 1 diabetes and on chronic insulin, last insulin 2 days ago.  DKA with Hyperglycemia most likely secondary to infection and non-compliance to insulin Anion gap 21.9 Start Insulin drip IV, Dex 5%, potassium IV Aggressive IV fluid Monitor CBG, BMP Trend anion gap  Ulcerative skin on L arm and poss. Cellulitis on L hand Start doxycycline Pending wound culture and blood culture Wound management  IV Drug User Pending  urine drug screen Pending HIV      Arie Powell Santiago Glad, M.D.  [facility]  Medical Director [facility] Medical Director [facility] Cardio-Pulmonary Department

## 2018-08-17 NOTE — Progress Notes (Signed)
Pharmacy Electrolyte Monitoring Consult:  Pharmacy consulted to assist in monitoring and replacing electrolytes in this 28 y.o. male admitted on 08/17/2018 with Hyperglycemia  Patient on DKA insulin drip protocol.   NS currently infusing at 159mL/hr.   Labs:  Sodium (mmol/L)  Date Value  08/17/2018 128 (L)  12/31/2016 138  04/06/2014 137   Potassium (mmol/L)  Date Value  08/17/2018 4.8  04/06/2014 3.8   Magnesium (mg/dL)  Date Value  03/75/4360 2.1  12/12/2013 1.6 (L)   Phosphorus (mg/dL)  Date Value  67/70/3403 3.6  12/10/2013 2.6   Calcium (mg/dL)  Date Value  52/48/1859 8.0 (L)   Calcium, Total (mg/dL)  Date Value  09/31/1216 8.1 (L)   Albumin (g/dL)  Date Value  24/46/9507 4.0  12/31/2016 4.3  04/05/2014 3.4    Assessment/Plan: Patient received potassium IV x 2 doses. Will follow along with scheduled Q4hr BMPs.   Pharmacy will continue to monitor and adjust per consult.   Simpson,Michael L 08/17/2018 4:44 PM

## 2018-08-17 NOTE — ED Provider Notes (Addendum)
[facility] Emergency Department Provider Note   ____________________________________________   First MD Initiated Contact with Patient 08/17/18 1106     (approximate)  I have reviewed the triage vital signs and the nursing notes.   HISTORY  Chief Complaint Hyperglycemia  HPI Trevor Brown is a 28 y.o. male who comes in with a high blood sugar.  He smells ketotic.  He says he thinks he is in DKA.  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  Last injection was 4 days ago.  He says he was injecting because his dad died 4 days ago.  He complains of some belly pain that he usually has when he has DKA.      Past Medical History:  Diagnosis Date  . Diabetes mellitus without complication (HCC)   . Heart attack (HCC)    Pt claims he had heart attack a year ago ( 2015)- and was admitted in [facility] for that, but not given any meds or angiogram, on review of chart- I could not find any details like that.  . Hepatitis C, acute may 2016  . Hypertension     Patient Active Problem List   Diagnosis Date Noted  . Cellulitis 08/17/2018  . Abdominal pain 06/29/2017  . Hyponatremia 06/29/2017  . DKA (diabetic ketoacidosis) (HCC) 06/29/2017  . Dental erosion extending into pulp 12/31/2016  . 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 Elevated transaminase level 03/27/2016  . Diabetes mellitus type 1 (HCC) 03/27/2016  . Tobacco abuse counseling 03/27/2016  . Transaminitis 03/25/2016  . Tobacco abuse 01/10/2016  . Type 1 diabetes mellitus with hyperglycemia (HCC) 12/12/2015  . Cocaine abuse (HCC) 12/12/2015  . DKA (diabetic ketoacidoses) (HCC) 06/10/2015  . Hidradenitis suppurativa of left axilla   . DKA, type 1 (HCC) 06/06/2015  . Malnutrition of moderate degree (HCC) 04/08/2015  . Hepatitis C 12/13/2014  . Diabetes type 1, uncontrolled (HCC) 12/11/2014  . Major depressive disorder, single episode, mild (HCC)   . Major depression,  single episode 12/10/2014  . Diabetes mellitus type 1, uncontrolled (HCC) 12/09/2014  . Homelessness 12/09/2014    Past Surgical History:  Procedure Laterality Date  . none      Prior to Admission medications   Medication Sig Start Date End Date Taking? Authorizing Provider  insulin aspart (NOVOLOG) 100 UNIT/ML injection Inject 5-10 Units into the skin See admin instructions. INJECT BEFORE MEALS PER SLIDING SCALE AS DIRECTED: 120-250 = 5 UNITS;251-350 = 8 UNITS; OVER 350 = 10 UNITS 05/20/18  Yes Doles-Johnson, Teah, NP  insulin aspart protamine- aspart (NOVOLOG MIX 70/30) (70-30) 100 UNIT/ML injection INJECT 28 UNITS UNDER THE SKIN WITH BREAKFAST AND 22 UNITS WITH DINNER Patient taking differently: Inject 23 Units into the skin See admin instructions. 22 units every morning and 23 units at bedtime 03/31/18  Yes Doles-Johnson, Teah, NP  doxycycline (VIBRAMYCIN) 100 MG capsule Take 1 capsule (100 mg total) by mouth 2 (two) times daily. Patient not taking: Reported on 06/13/2018 04/21/18   Sharman CheekStafford, Phillip, MD    Allergies Bee venom; Penicillins; Ibuprofen; Tramadol; and Vancomycin  Family History  Problem Relation Age of Onset  . Cirrhosis Mother   . Diabetes Mellitus II Maternal Grandmother   . Diabetes Mellitus II Maternal Grandfather     Social History Social History   Tobacco Use  . Smoking status: Current Every Day Smoker    Packs/day: 2.00    Types: Cigarettes  .  Smokeless tobacco: Never Used  Substance Use Topics  . Alcohol use: No    Alcohol/week: 0.0 standard drinks  . Drug use: Yes    Types: IV    Comment: heroin- last used in Janurary 2019    Review of Systems  Constitutional: No fever/chills Eyes: No visual changes. ENT: No sore throat. Cardiovascular: Denies chest pain. Respiratory: Denies shortness of breath. Gastrointestinal some mild abdominal pain.  No nausea, no vomiting.  No diarrhea.  No constipation. Genitourinary: Negative for  dysuria. Musculoskeletal: Negative for back pain. Skin: Negative for rash. Neurological: Negative for headaches, focal weakness   ____________________________________________   PHYSICAL EXAM:  VITAL SIGNS: ED Triage Vitals  Enc Vitals Group     BP 08/17/18 1038 131/77     Pulse Rate 08/17/18 1038 86     Resp 08/17/18 1038 20     Temp 08/17/18 1038 98.4 F (36.9 C)     Temp Source 08/17/18 1038 Oral     SpO2 08/17/18 1038 100 %     Weight 08/17/18 1039 120 lb (54.4 kg)     Height 08/17/18 1039 5\' 4"  (1.626 m)     Head Circumference --      Peak Flow --      Pain Score 08/17/18 1048 10     Pain Loc --      Pain Edu? --      Excl. in GC? --    Constitutional: Alert and oriented. Well appearing and in no acute distress. Eyes: Conjunctivae are normal.  Head: Atraumatic. Nose: No congestion/rhinnorhea. Mouth/Throat: Mucous membranes are moist.  Oropharynx non-erythematous. Neck: No stridor.  Cardiovascular: Normal rate, regular rhythm. Grossly normal heart sounds.  Good peripheral circulation. Respiratory: Normal respiratory effort.  No retractions. Lungs CTAB. Gastrointestinal: Soft patient reports mild to moderate left lower quadrant pain to palpation only he says he gets this pain when he gets DKA.Marland Kitchen No distention. No abdominal bruits. No CVA tenderness. Musculoskeletal: No lower extremity tenderness nor edema.  Patient has some surgical scars on his arms.  He has two quarter sized raised red areas on the dorsum of his left hand which are red and warm and tender.  There is slightly fluctuant.  He also has a little bit of reddening around some stitches which remain in the dorsal forearm.  Patient has good strength and range of motion in the fingers and wrist. Neurologic:  Normal speech and language. No gross focal neurologic deficits are appreciated. No gait instability. Skin:  Skin is warm, dry and intact except for see above musculoskeletal. No rash noted.  There is also a 57  pea-sized area of denuded skin medially and at the elbow which does not look infected at this point.   ____________________________________________   LABS (all labs ordered are listed, but only abnormal results are displayed)  Labs Reviewed  GLUCOSE, CAPILLARY - Abnormal; Notable for the following components:      Result Value   Glucose-Capillary 564 (*)    All other components within normal limits  COMPREHENSIVE METABOLIC PANEL - Abnormal; Notable for the following components:   Sodium 128 (*)    Chloride 89 (*)    CO2 15 (*)    Glucose, Bld 565 (*)    Total Protein 8.7 (*)    AST 188 (*)    ALT 166 (*)    Alkaline Phosphatase 247 (*)    Total Bilirubin 2.0 (*)    Anion gap 24 (*)    All other components  within normal limits  BLOOD GAS, VENOUS - Abnormal; Notable for the following components:   pCO2, Ven 30 (*)    Bicarbonate 14.1 (*)    Acid-base deficit 11.3 (*)    All other components within normal limits  GLUCOSE, CAPILLARY - Abnormal; Notable for the following components:   Glucose-Capillary 462 (*)    All other components within normal limits  BASIC METABOLIC PANEL - Abnormal; Notable for the following components:   Sodium 128 (*)    Chloride 92 (*)    CO2 18 (*)    Glucose, Bld 446 (*)    Calcium 8.0 (*)    Anion gap 18 (*)    All other components within normal limits  GLUCOSE, CAPILLARY - Abnormal; Notable for the following components:   Glucose-Capillary 512 (*)    All other components within normal limits  GLUCOSE, CAPILLARY - Abnormal; Notable for the following components:   Glucose-Capillary 441 (*)    All other components within normal limits  GLUCOSE, CAPILLARY - Abnormal; Notable for the following components:   Glucose-Capillary 295 (*)    All other components within normal limits  GLUCOSE, CAPILLARY - Abnormal; Notable for the following components:   Glucose-Capillary 221 (*)    All other components within normal limits  MRSA PCR SCREENING   AEROBIC CULTURE (SUPERFICIAL SPECIMEN)  CULTURE, BLOOD (ROUTINE X 2)  CULTURE, BLOOD (ROUTINE X 2)  CBC WITH DIFFERENTIAL/PLATELET  LIPASE, BLOOD  HIV ANTIBODY (ROUTINE TESTING W REFLEX)  BASIC METABOLIC PANEL  BASIC METABOLIC PANEL  BASIC METABOLIC PANEL  URINE DRUG SCREEN, QUALITATIVE ([facility] ONLY)  CBG MONITORING, ED  CBG MONITORING, ED   ____________________________________________  EKG   ____________________________________________  RADIOLOGY  ED MD interpretation:  Official radiology report(s): Ct Abdomen Pelvis Wo Contrast  Result Date: 08/17/2018 CLINICAL DATA:  Seen in the ED for blood glucose of 565. Left abdominal pain also noted. Normal WBC. Patient in DKA has left lower quadrant pain and markedly elevated liver functions much higher than usual EXAM: CT ABDOMEN AND PELVIS WITHOUT CONTRAST TECHNIQUE: Multidetector CT imaging of the abdomen and pelvis was performed following the standard protocol without IV contrast. COMPARISON:  04/09/2016 FINDINGS: Lower chest: No pleural or pericardial effusion. Visualized lung bases clear. Hepatobiliary: No focal liver abnormality is seen. No gallstones, gallbladder wall thickening, or biliary dilatation. Pancreas: Unremarkable. No pancreatic ductal dilatation or surrounding inflammatory changes. Spleen: Normal in size without focal abnormality. Adrenals/Urinary Tract: Normal adrenal glands. 1 mm calcification in the lower pole right renal collecting system. No hydronephrosis. Urinary bladder physiologically distended. Stomach/Bowel: Stomach and small bowel are nondilated. Incomplete passage of oral contrast material through the distal small bowel. Moderate colonic fecal material without dilatation. Vascular/Lymphatic: No significant vascular findings are present. No enlarged abdominal or pelvic lymph nodes. Reproductive: Prostate is unremarkable. Other: No ascites. No free air. Musculoskeletal: No acute or significant osseous findings.  IMPRESSION: 1. No acute abdominal process. 2. Stable right nephrolithiasis without hydronephrosis. Electronically Signed   By: Corlis Leak M.D.   On: 08/17/2018 14:37    ____________________________________________   PROCEDURES  Procedure(s) performed: Oral consent obtained forearm and hand prepped with Betadine anesthetized over the top of each of the lumps and around the stitch which is protruding.  With lidocaine with no epi.  An 18-gauge needle was then inserted through the anesthetized area and the lumps were aspirated about 1 cc of pus was aspirated out of each 1 of the 2 lumps.  The stitch was grabbed and traction  was placed on it was cut off.  Hopefully this will make it go below the level of the skin.  The area there is otherwise well-healed.  Procedures  Critical Care performed: Critical care time 40 minutes.  This includes aspirating his abscesses evaluating him treating him otherwise for the DKA and discussing his case with the hospital physician.  ____________________________________________   INITIAL IMPRESSION / ASSESSMENT AND PLAN / ED COURSE Discussed with the patient the fact that if the aspiration of both of the small skin abscesses does not work we will need to open them.  But hopefully the drainage and antibiotics will do enough.          ____________________________________________   FINAL CLINICAL IMPRESSION(S) / ED DIAGNOSES  Final diagnoses:  Diabetic ketoacidosis without coma associated with type 1 diabetes mellitus [facility](HCC)     ED Discharge Orders    None       Note:  This document was prepared using Dragon voice recognition software and may include unintentional dictation errors.    Arnaldo NatalMalinda, Paul F, MD 08/17/18 1943    Arnaldo NatalMalinda, Paul F, MD 08/27/18 Burna [id number]

## 2018-08-17 NOTE — ED Notes (Signed)
Pt reports IV drug use 4 days ago after the death of his father - noted to have abscesses to left hand/lower arm - also reports elevated blood sugars with general malaise - pt is flushed and lethargic at this time IV attempted x2 without success due to multiple track marks from drug usage Judeth Cornfield RN to ultrasound IV

## 2018-08-17 NOTE — ED Notes (Signed)
Provider notified of blood sugar of 565 - no new orders at this time

## 2018-08-17 NOTE — ED Triage Notes (Signed)
Type 1 diabetic blood sugars started running high yesterday. Elevated in triage. States feels it is due to abscesses L arm caused by self injection heroine.

## 2018-08-17 NOTE — ED Notes (Signed)
ED TO INPATIENT HANDOFF REPORT  Name/Age/Gender Trevor Brown 28 y.o. male  Code Status Code Status History    Date Active Date Inactive Code Status Order ID Comments User Context   06/29/2017 1354 06/30/2017 1850 Full Code [id number]  Marguarite Arbour, MD Inpatient   06/05/2017 0456 06/07/2017 2052 Full Code [id number]  Arnaldo Natal, MD Inpatient   06/04/2017 0259 06/05/2017 0456 Full Code [id number]  Ihor Austin, MD Inpatient   04/17/2017 1055 04/21/2017 1559 Full Code [id number]  Ramonita Lab, MD Inpatient   02/03/2017 1336 02/04/2017 2128 Full Code [id number]  Gracelyn Nurse, MD ED   10/13/2016 0526 10/15/2016 1812 Full Code [id number]  Arnaldo Natal, MD Inpatient   09/08/2016 0352 09/08/2016 1837 Full Code [id number]  Hugelmeyer, Alexis, DO Inpatient   08/12/2016 2220 08/13/2016 1710 Full Code [id number]  Katharina Caper, MD Inpatient   07/04/2016 1224 07/05/2016 1936 Full Code [id number]  Altamese Dilling, MD Inpatient   07/04/2016 1224 07/04/2016 1224 Full Code [id number]  Altamese Dilling, MD Inpatient   04/09/2016 1654 04/11/2016 1409 Full Code [id number]  Katharina Caper, MD Inpatient   03/26/2016 0118 03/27/2016 1911 Full Code [id number]  Tonye Royalty, DO Inpatient   03/18/2016 0815 03/21/2016 2035 Full Code [id number]  Wyatt Haste, MD ED   03/12/2016 1220 03/16/2016 1927 Full Code [id number]  Enedina Finner, MD Inpatient   02/18/2016 1659 02/19/2016 1718 Full Code [id number]  Ramonita Lab, MD ED   01/12/2016 0858 01/15/2016 1933 Full Code [id number]  Milagros Loll, MD ED   01/11/2016 0035 01/11/2016 2320 Full Code [id number]  Gery Pray, MD Inpatient   12/12/2015 2204 12/14/2015 1838 Full Code [id number]  Katharina Caper, MD Inpatient   10/23/2015 1742 10/25/2015 1904 Full Code [id number]  Katha Hamming, MD ED   06/10/2015 0820 06/12/2015 1441 Full Code [id number]  Arnaldo Natal, MD Inpatient   06/06/2015 2043 06/07/2015 1620 Full Code [id number]  Enedina Finner, MD Inpatient    04/07/2015 1245 04/08/2015 2037 Full Code [id number]  Altamese Dilling, MD Inpatient   12/09/2014 1444 12/13/2014 1616 Full Code [id number]  Gale Journey, MD Inpatient      Home/SNF/Other Home  Chief Complaint sugars high  Level of Care/Admitting Diagnosis ED Disposition    ED Disposition Condition Comment   Admit  Hospital Area: [facility] [[id number]]  Level of Care: Stepdown [14]  Diagnosis: DKA (diabetic ketoacidoses) [facility]) [[id number]]  Admitting Physician: Altamese Dilling [phone]  Attending Physician: Altamese Dilling [phone]  Estimated length of stay: past midnight tomorrow  Certification:: I certify this patient will need inpatient services for at least 2 midnights  PT Class (Do Not Modify): Inpatient [101]  PT Acc Code (Do Not Modify): Private [1]       Medical History Past Medical History:  Diagnosis Date  . Diabetes mellitus without complication (HCC)   . Heart attack (HCC)    Pt claims he had heart attack a year ago ( 2015)- and was admitted in [facility] for that, but not given any meds or angiogram, on review of chart- I could not find any details like that.  . Hepatitis C, acute may 2016  . Hypertension     Allergies Allergies  Allergen Reactions  . Bee Venom Anaphylaxis  . Penicillins Anaphylaxis, Hives and Other (See Comments)    Has patient had a PCN reaction causing immediate rash, facial/tongue/throat swelling, SOB or lightheadedness with hypotension: Yes Has patient had a PCN reaction causing severe rash involving mucus membranes or skin necrosis:  No Has patient had a PCN reaction that required hospitalization No Has patient had a PCN reaction occurring within the last 10 years: Yes If all of the above answers are "NO", then may proceed with Cephalosporin use.  . Ibuprofen Other (See Comments)    Other reaction(s): Other (See Comments) Stomach upset Reaction:  GI upset   . Tramadol Hives and Swelling  . Vancomycin  Rash and Other (See Comments)    Reaction:  Red man's syndrome     IV Location/Drains/Wounds Patient Lines/Drains/Airways Status   Active Line/Drains/Airways    Name:   Placement date:   Placement time:   Site:   Days:   Peripheral IV 08/17/18 Right Antecubital   08/17/18    1158    Antecubital   less than 1          Labs/Imaging Results for orders placed or performed during the hospital encounter of 08/17/18 (from the past 48 hour(s))  Glucose, capillary     Status: Abnormal   Collection Time: 08/17/18 10:37 AM  Result Value Ref Range   Glucose-Capillary 564 (HH) 70 - 99 mg/dL   Comment 1 Notify RN    Comment 2 Document in Chart   Comprehensive metabolic panel     Status: Abnormal   Collection Time: 08/17/18 11:17 AM  Result Value Ref Range   Sodium 128 (L) 135 - 145 mmol/L    Comment: LYTES REPEATED SMADAS   Potassium 4.9 3.5 - 5.1 mmol/L   Chloride 89 (L) 98 - 111 mmol/L   CO2 15 (L) 22 - 32 mmol/L   Glucose, Bld 565 (HH) 70 - 99 mg/dL    Comment: CRITICAL RESULT CALLED TO, READ BACK BY AND VERIFIED WITH Kyleigh Nannini @ 1225 08/17/2018 SMA/DAS    BUN 17 6 - 20 mg/dL   Creatinine, Ser 1.911.13 0.61 - 1.24 mg/dL   Calcium 9.0 8.9 - 47.810.3 mg/dL   Total Protein 8.7 (H) 6.5 - 8.1 g/dL   Albumin 4.0 3.5 - 5.0 g/dL   AST 295188 (H) 15 - 41 U/L   ALT 166 (H) 0 - 44 U/L   Alkaline Phosphatase 247 (H) 38 - 126 U/L   Total Bilirubin 2.0 (H) 0.3 - 1.2 mg/dL   GFR calc non Af Amer >60 >60 mL/min   GFR calc Af Amer >60 >60 mL/min   Anion gap 24 (H) 5 - 15    Comment: Performed at [facility], [address] [place], [place] [address]  CBC with Differential     Status: None   Collection Time: 08/17/18 11:17 AM  Result Value Ref Range   WBC 8.6 4.0 - 10.5 K/uL   RBC 4.70 4.22 - 5.81 MIL/uL   Hemoglobin 13.6 13.0 - 17.0 g/dL   HCT 86.542.8 78.439.0 - 69.652.0 %   MCV 91.1 80.0 - 100.0 fL   MCH 28.9 26.0 - 34.0 pg   MCHC 31.8 30.0 - 36.0 g/dL   RDW 29.514.0 28.411.5 - 13.215.5 %   Platelets 326  150 - 400 K/uL   nRBC 0.0 0.0 - 0.2 %   Neutrophils Relative % 71 %   Neutro Abs 6.2 1.7 - 7.7 K/uL   Lymphocytes Relative 20 %   Lymphs Abs 1.8 0.7 - 4.0 K/uL   Monocytes Relative 6 %   Monocytes Absolute 0.5 0.1 - 1.0 K/uL   Eosinophils Relative 1 %   Eosinophils Absolute 0.1 0.0 - 0.5 K/uL   Basophils Relative 1 %  Basophils Absolute 0.1 0.0 - 0.1 K/uL   Immature Granulocytes 1 %   Abs Immature Granulocytes 0.04 0.00 - 0.07 K/uL    Comment: Performed at [facility], [address] [place], [place] [address]  Blood gas, venous     Status: Abnormal   Collection Time: 08/17/18 11:17 AM  Result Value Ref Range   pH, Ven 7.28 7.250 - 7.430   pCO2, Ven 30 (L) 44.0 - 60.0 mmHg   pO2, Ven 43.0 32.0 - 45.0 mmHg   Bicarbonate 14.1 (L) 20.0 - 28.0 mmol/L   Acid-base deficit 11.3 (H) 0.0 - 2.0 mmol/L   O2 Saturation 71.4 %   Patient temperature 37.0    Collection site VENOUS    Sample type VENOUS     Comment: Performed at [facility], [address] [place], [place] [address]  Lipase, blood     Status: None   Collection Time: 08/17/18 11:17 AM  Result Value Ref Range   Lipase 22 11 - 51 U/L    Comment: Performed at [facility], [address] [place], [place] [address]  Glucose, capillary     Status: Abnormal   Collection Time: 08/17/18  1:24 PM  Result Value Ref Range   Glucose-Capillary 462 (H) 70 - 99 mg/dL  Basic metabolic panel     Status: Abnormal   Collection Time: 08/17/18  2:42 PM  Result Value Ref Range   Sodium 128 (L) 135 - 145 mmol/L   Potassium 4.8 3.5 - 5.1 mmol/L   Chloride 92 (L) 98 - 111 mmol/L   CO2 18 (L) 22 - 32 mmol/L   Glucose, Bld 446 (H) 70 - 99 mg/dL   BUN 16 6 - 20 mg/dL   Creatinine, Ser 6.38 0.61 - 1.24 mg/dL   Calcium 8.0 (L) 8.9 - 10.3 mg/dL   GFR calc non Af Amer >60 >60 mL/min   GFR calc Af Amer >60 >60 mL/min   Anion gap 18 (H) 5 - 15    Comment: Performed at [facility], [address]  [place], [place] [address]   Ct Abdomen Pelvis Wo Contrast  Result Date: 08/17/2018 CLINICAL DATA:  Seen in the ED for blood glucose of 565. Left abdominal pain also noted. Normal WBC. Patient in DKA has left lower quadrant pain and markedly elevated liver functions much higher than usual EXAM: CT ABDOMEN AND PELVIS WITHOUT CONTRAST TECHNIQUE: Multidetector CT imaging of the abdomen and pelvis was performed following the standard protocol without IV contrast. COMPARISON:  04/09/2016 FINDINGS: Lower chest: No pleural or pericardial effusion. Visualized lung bases clear. Hepatobiliary: No focal liver abnormality is seen. No gallstones, gallbladder wall thickening, or biliary dilatation. Pancreas: Unremarkable. No pancreatic ductal dilatation or surrounding inflammatory changes. Spleen: Normal in size without focal abnormality. Adrenals/Urinary Tract: Normal adrenal glands. 1 mm calcification in the lower pole right renal collecting system. No hydronephrosis. Urinary bladder physiologically distended. Stomach/Bowel: Stomach and small bowel are nondilated. Incomplete passage of oral contrast material through the distal small bowel. Moderate colonic fecal material without dilatation. Vascular/Lymphatic: No significant vascular findings are present. No enlarged abdominal or pelvic lymph nodes. Reproductive: Prostate is unremarkable. Other: No ascites. No free air. Musculoskeletal: No acute or significant osseous findings. IMPRESSION: 1. No acute abdominal process. 2. Stable right nephrolithiasis without hydronephrosis. Electronically Signed   By: Corlis Leak M.D.   On: 08/17/2018 14:37    Pending Labs [employer] (From admission, onward)    Start  Ordered   08/17/18 1511  CULTURE, BLOOD (ROUTINE X 2) w Reflex to ID Panel  BLOOD CULTURE X 2,   STAT     08/17/18 1510   08/17/18 1120  Wound or Superficial Culture  ONCE - STAT,   STAT     08/17/18 1119   Signed and Held  HIV antibody (Routine Testing)  Once,   R      Signed and Held   Signed and Held  Basic metabolic panel  STAT Now then every 4 hours ,   STAT     Signed and Held   Signed and Held  CBC  (enoxaparin (LOVENOX)    CrCl >/= 30 ml/min)  Once,   R    Comments:  Baseline for enoxaparin therapy IF NOT ALREADY DRAWN.  Notify MD if PLT < 100 K.    Signed and Held   Signed and Held  Creatinine, serum  (enoxaparin (LOVENOX)    CrCl >/= 30 ml/min)  Once,   R    Comments:  Baseline for enoxaparin therapy IF NOT ALREADY DRAWN.    Signed and Held   Signed and Held  Creatinine, serum  (enoxaparin (LOVENOX)    CrCl >/= 30 ml/min)  Weekly,   R    Comments:  while on enoxaparin therapy    Signed and Held          Vitals/Pain Today's Vitals   08/17/18 1048 08/17/18 1300 08/17/18 1330 08/17/18 1332  BP:  133/86 126/84   Pulse:  86 64   Resp:  20 18   Temp:      TempSrc:      SpO2:  100% 100%   Weight:      Height:      PainSc: 10-Worst pain ever   10-Worst pain ever    Isolation Precautions No active isolations  Medications Medications  iopamidol (ISOVUE-300) 61 % injection 30 mL (has no administration in time range)  doxycycline (VIBRA-TABS) tablet 100 mg (has no administration in time range)  oxyCODONE-acetaminophen (PERCOCET/ROXICET) 5-325 MG per tablet 1 tablet (has no administration in time range)  lidocaine (PF) (XYLOCAINE) 1 % injection (  Given 08/17/18 1143)  sodium chloride 0.9 % bolus 1,000 mL (0 mLs Intravenous Stopped 08/17/18 1504)  insulin aspart (novoLOG) injection 5 Units (5 Units Intravenous Given 08/17/18 1215)  morphine 4 MG/ML injection 4 mg (4 mg Intravenous Given 08/17/18 1223)  ondansetron (ZOFRAN) injection 4 mg (4 mg Intravenous Given 08/17/18 1223)  vancomycin (VANCOCIN) IVPB 1000 mg/200 mL premix (0 mg Intravenous Stopped 08/17/18 1504)  cefTRIAXone (ROCEPHIN) 1 g in sodium chloride 0.9 % 100 mL IVPB (0 g Intravenous Stopped 08/17/18 1312)    Mobility walks

## 2018-08-17 NOTE — ED Notes (Signed)
Dr Darnelle Catalan notified of CBG reading

## 2018-08-18 LAB — CBC WITH DIFFERENTIAL/PLATELET
Abs Immature Granulocytes: 0.06 10*3/uL (ref 0.00–0.07)
Basophils Absolute: 0.1 10*3/uL (ref 0.0–0.1)
Basophils Relative: 1 %
Eosinophils Absolute: 0.1 10*3/uL (ref 0.0–0.5)
Eosinophils Relative: 1 %
HCT: 40.2 % (ref 39.0–52.0)
Hemoglobin: 12.9 g/dL — ABNORMAL LOW (ref 13.0–17.0)
Immature Granulocytes: 1 %
Lymphocytes Relative: 18 %
Lymphs Abs: 2.1 10*3/uL (ref 0.7–4.0)
MCH: 29.1 pg (ref 26.0–34.0)
MCHC: 32.1 g/dL (ref 30.0–36.0)
MCV: 90.5 fL (ref 80.0–100.0)
Monocytes Absolute: 0.6 10*3/uL (ref 0.1–1.0)
Monocytes Relative: 6 %
Neutro Abs: 8.3 10*3/uL — ABNORMAL HIGH (ref 1.7–7.7)
Neutrophils Relative %: 73 %
Platelets: 334 10*3/uL (ref 150–400)
RBC: 4.44 MIL/uL (ref 4.22–5.81)
RDW: 13.8 % (ref 11.5–15.5)
WBC: 11.2 10*3/uL — ABNORMAL HIGH (ref 4.0–10.5)
nRBC: 0 % (ref 0.0–0.2)

## 2018-08-18 LAB — GLUCOSE, CAPILLARY
Glucose-Capillary: 583 mg/dL (ref 70–99)
Glucose-Capillary: 451 mg/dL — ABNORMAL HIGH (ref 70–99)
Glucose-Capillary: 374 mg/dL — ABNORMAL HIGH (ref 70–99)
Glucose-Capillary: 339 mg/dL — ABNORMAL HIGH (ref 70–99)
Glucose-Capillary: 224 mg/dL — ABNORMAL HIGH (ref 70–99)
Glucose-Capillary: 358 mg/dL — ABNORMAL HIGH (ref 70–99)
Glucose-Capillary: 377 mg/dL — ABNORMAL HIGH (ref 70–99)
Glucose-Capillary: 251 mg/dL — ABNORMAL HIGH (ref 70–99)

## 2018-08-18 LAB — URINE DRUG SCREEN, QUALITATIVE (ARMC ONLY)
Amphetamines, Ur Screen: NOT DETECTED
Barbiturates, Ur Screen: NOT DETECTED
Benzodiazepine, Ur Scrn: NOT DETECTED
Cannabinoid 50 Ng, Ur ~~LOC~~: NOT DETECTED
Cocaine Metabolite,Ur ~~LOC~~: NOT DETECTED
MDMA (Ecstasy)Ur Screen: NOT DETECTED
Methadone Scn, Ur: NOT DETECTED
Opiate, Ur Screen: POSITIVE — AB
Phencyclidine (PCP) Ur S: NOT DETECTED
Tricyclic, Ur Screen: NOT DETECTED

## 2018-08-18 LAB — BASIC METABOLIC PANEL
Anion gap: 20 — ABNORMAL HIGH (ref 5–15)
Anion gap: 17 — ABNORMAL HIGH (ref 5–15)
Anion gap: 5 (ref 5–15)
BUN: 17 mg/dL (ref 6–20)
BUN: 18 mg/dL (ref 6–20)
BUN: 22 mg/dL — ABNORMAL HIGH (ref 6–20)
CO2: 11 mmol/L — ABNORMAL LOW (ref 22–32)
CO2: 16 mmol/L — ABNORMAL LOW (ref 22–32)
CO2: 24 mmol/L (ref 22–32)
Calcium: 8.1 mg/dL — ABNORMAL LOW (ref 8.9–10.3)
Calcium: 8.5 mg/dL — ABNORMAL LOW (ref 8.9–10.3)
Calcium: 7.8 mg/dL — ABNORMAL LOW (ref 8.9–10.3)
Chloride: 96 mmol/L — ABNORMAL LOW (ref 98–111)
Chloride: 98 mmol/L (ref 98–111)
Chloride: 103 mmol/L (ref 98–111)
Creatinine, Ser: 0.97 mg/dL (ref 0.61–1.24)
Creatinine, Ser: 1 mg/dL (ref 0.61–1.24)
Creatinine, Ser: 0.65 mg/dL (ref 0.61–1.24)
GFR calc Af Amer: >60 mL/min (ref >60)
GFR calc Af Amer: >60 mL/min (ref >60)
GFR calc Af Amer: >60 mL/min (ref >60)
GFR calc non Af Amer: >60 mL/min (ref >60)
GFR calc non Af Amer: >60 mL/min (ref >60)
GFR calc non Af Amer: >60 mL/min (ref >60)
Glucose, Bld: 537 mg/dL (ref 70–99)
Glucose, Bld: 345 mg/dL — ABNORMAL HIGH (ref 70–99)
Glucose, Bld: 178 mg/dL — ABNORMAL HIGH (ref 70–99)
Potassium: 5.6 mmol/L — ABNORMAL HIGH (ref 3.5–5.1)
Potassium: 4 mmol/L (ref 3.5–5.1)
Potassium: 3.5 mmol/L (ref 3.5–5.1)
Sodium: 127 mmol/L — ABNORMAL LOW (ref 135–145)
Sodium: 131 mmol/L — ABNORMAL LOW (ref 135–145)
Sodium: 132 mmol/L — ABNORMAL LOW (ref 135–145)

## 2018-08-18 MED ORDER — INSULIN ASPART 100 UNIT/ML ~~LOC~~ SOLN
0.0000 [IU] | Freq: Three times a day (TID) | SUBCUTANEOUS | Status: DC
  Administered 2018-08-18: 15 [IU] via SUBCUTANEOUS
  Filled 2018-08-18: qty 1

## 2018-08-18 MED ORDER — ONDANSETRON HCL 4 MG/2ML IJ SOLN: 4.0000 mg | Freq: Four times a day (QID) | INTRAMUSCULAR | Status: DC | PRN

## 2018-08-18 MED ORDER — SODIUM CHLORIDE 0.9 % IV BOLUS
1000.0000 mL | Freq: Once | INTRAVENOUS | Status: AC
  Administered 2018-08-18: 1000 mL via INTRAVENOUS

## 2018-08-18 MED ORDER — OXYCODONE-ACETAMINOPHEN 5-325 MG PO TABS
1.0000 | ORAL_TABLET | ORAL | Status: DC | PRN
  Administered 2018-08-18 – 2018-08-22 (×22): 1 via ORAL
  Filled 2018-08-18 (×22): qty 1

## 2018-08-18 MED ORDER — INSULIN ASPART 100 UNIT/ML ~~LOC~~ SOLN
5.0000 [IU] | Freq: Three times a day (TID) | SUBCUTANEOUS | Status: DC
  Administered 2018-08-18: 5 [IU] via SUBCUTANEOUS

## 2018-08-18 MED ORDER — INSULIN ASPART 100 UNIT/ML ~~LOC~~ SOLN: 5.0000 [IU] | Freq: Three times a day (TID) | SUBCUTANEOUS | Status: DC

## 2018-08-18 MED ORDER — SODIUM CHLORIDE 0.9 % IV SOLN
INTRAVENOUS | Status: AC
  Administered 2018-08-18: 11:00:00 via INTRAVENOUS

## 2018-08-18 MED ORDER — ONDANSETRON HCL 4 MG/2ML IJ SOLN
INTRAMUSCULAR | Status: AC
  Administered 2018-08-18: 05:00:00
  Filled 2018-08-18: qty 2

## 2018-08-18 MED ORDER — POTASSIUM CHLORIDE CRYS ER 20 MEQ PO TBCR
40.0000 meq | EXTENDED_RELEASE_TABLET | Freq: Once | ORAL | Status: AC
  Administered 2018-08-18: 40 meq via ORAL
  Filled 2018-08-18: qty 2

## 2018-08-18 MED ORDER — INSULIN ASPART PROT & ASPART (70-30 MIX) 100 UNIT/ML ~~LOC~~ SUSP: 22.0000 [IU] | Freq: Every day | SUBCUTANEOUS | Status: DC

## 2018-08-18 MED ORDER — INSULIN ASPART PROT & ASPART (70-30 MIX) 100 UNIT/ML ~~LOC~~ SUSP
23.0000 [IU] | Freq: Every day | SUBCUTANEOUS | Status: DC
  Administered 2018-08-18: 23 [IU] via SUBCUTANEOUS
  Filled 2018-08-18: qty 10

## 2018-08-18 MED ORDER — INSULIN ASPART PROT & ASPART (70-30 MIX) 100 UNIT/ML ~~LOC~~ SUSP: 22.0000 [IU] | Freq: Once | SUBCUTANEOUS | Status: DC

## 2018-08-18 MED ORDER — DEXTROSE-NACL 5-0.45 % IV SOLN: INTRAVENOUS | Status: DC

## 2018-08-18 MED ORDER — SODIUM CHLORIDE 0.9 % IV SOLN
INTRAVENOUS | Status: DC
  Administered 2018-08-18: 23:00:00 via INTRAVENOUS
  Administered 2018-08-18: 150 mL via INTRAVENOUS
  Administered 2018-08-19 – 2018-08-20 (×4): via INTRAVENOUS

## 2018-08-18 MED ORDER — INSULIN ASPART 100 UNIT/ML ~~LOC~~ SOLN: 0.0000 [IU] | Freq: Every day | SUBCUTANEOUS | Status: DC

## 2018-08-18 MED ORDER — INSULIN ASPART 100 UNIT/ML ~~LOC~~ SOLN
18.0000 [IU] | Freq: Once | SUBCUTANEOUS | Status: AC
  Administered 2018-08-18: 18 [IU] via SUBCUTANEOUS
  Filled 2018-08-18: qty 1

## 2018-08-18 MED ORDER — INSULIN ASPART 100 UNIT/ML ~~LOC~~ SOLN
5.0000 [IU] | Freq: Three times a day (TID) | SUBCUTANEOUS | Status: DC
  Filled 2018-08-18: qty 1

## 2018-08-18 MED ORDER — INSULIN ASPART 100 UNIT/ML ~~LOC~~ SOLN: 0.0000 [IU] | Freq: Three times a day (TID) | SUBCUTANEOUS | Status: DC

## 2018-08-18 MED ORDER — INSULIN ASPART 100 UNIT/ML ~~LOC~~ SOLN
0.0000 [IU] | Freq: Every day | SUBCUTANEOUS | Status: DC
  Administered 2018-08-18: 5 [IU] via SUBCUTANEOUS
  Filled 2018-08-18: qty 1

## 2018-08-18 MED ORDER — INSULIN REGULAR(HUMAN) IN NACL 100-0.9 UT/100ML-% IV SOLN
INTRAVENOUS | Status: DC
  Filled 2018-08-18: qty 100

## 2018-08-18 MED ORDER — INSULIN ASPART 100 UNIT/ML ~~LOC~~ SOLN
0.0000 [IU] | Freq: Three times a day (TID) | SUBCUTANEOUS | Status: DC
  Administered 2018-08-18: 5 [IU] via SUBCUTANEOUS
  Administered 2018-08-18: 15 [IU] via SUBCUTANEOUS
  Administered 2018-08-19: 2 [IU] via SUBCUTANEOUS
  Administered 2018-08-19: 3 [IU] via SUBCUTANEOUS
  Administered 2018-08-19: 15 [IU] via SUBCUTANEOUS
  Administered 2018-08-20: 11 [IU] via SUBCUTANEOUS
  Administered 2018-08-20: 2 [IU] via SUBCUTANEOUS
  Administered 2018-08-20: 11 [IU] via SUBCUTANEOUS
  Administered 2018-08-21 – 2018-08-22 (×2): 5 [IU] via SUBCUTANEOUS
  Filled 2018-08-18 (×11): qty 1

## 2018-08-18 NOTE — Progress Notes (Signed)
Inpatient Diabetes Program Recommendations  AACE/ADA: New Consensus Statement on Inpatient Glycemic Control (2015)  Target Ranges:  Prepandial:   less than 140 mg/dL      Peak postprandial:   less than 180 mg/dL (1-2 hours)      Critically ill patients:  140 - 180 mg/dL   Results for MUSE, BRINCEFIELD (MRN [id number]) as of 08/18/2018 08:05  Ref. Range 08/17/2018 10:37 08/17/2018 13:24 08/17/2018 16:12 08/17/2018 17:16 08/17/2018 18:18 08/17/2018 19:09 08/17/2018 20:12 08/17/2018 22:16 08/18/2018 06:33  Glucose-Capillary Latest Ref Range: 70 - 99 mg/dL 315 (HH) 400 (H)  5 units NOVOLOG  512 (HH)  IV Insulin Drip Started 441 (H) 295 (H) 221 (H) 136 (H)  IV Insulin Drip Stopped 237 (H)  22 units 70/30 Insulin given at 9pm 583 (HH)  18 units NOVOLOG      Admit: DKA--Ran out of Insulin 2 days prior to admit--Pt reports IV drug use 4 days ago after the death of his father  History: Type 1 Diabetes, IV Drug Abuse  Home DM Meds: 70/30 Insulin- 22 AM/ 23 PM        Novolog 5-10 units TID per SSI       Patient well known to the Inpatient Glycemic Control Team--Multiple admissions for DKA over the past 2 years.  Transitioned off the IV Insulin Drip last PM--Given 22 units 70/30 Insulin last PM.  CBG rose sharply to 583 mg/dl this AM--Pt given 18 units Novolog.  BMET from 6am shows Anion Gap elevated to 20 and CO2 down to 11.  Sent Secure Chat to Dr. Imogene Burn to alert him to current BMET results.  Patient transferred to ICU, given IVF NS bolus, and started on IV Insulin drip per DKA orders.    --Will follow patient during hospitalization--  Ambrose Finland RN, MSN, CDE Diabetes Coordinator Inpatient Glycemic Control Team Team Pager: [phone] (8a-5p)

## 2018-08-18 NOTE — Progress Notes (Addendum)
   Name: PANTALEON VIGNALI MRN: [id number] DOB: October 19, 1990     CONSULTATION DATE: 08/17/2018  REFERRING MD :  Shaune Pollack, MD  CHIEF COMPLAINT:  Hyperglycemia  STUDIES:  1/27 - Arrived at ER for DKA with hyperglycemia, transferred to ICU, started insulin drip 1/27 - Transferred to med-surg 1/28 - CBG 583 in AM, transferred back to ICU Stepdown   HISTORY OF PRESENT ILLNESS:   Pt blood sugar stabilized and anion gap returned to normal limit, transferred to Med-surg last night 1/27 for continue DKA management with sliding scale insulin and novolog 70/30. This AM on 1/28 pt was found to be lethargic and CBG was at 583. Pt transferred back to ICU Stepdown for hyperglycemia management.    REVIEW OF SYSTEMS:   Review of Systems  Constitutional: Positive for malaise/fatigue. Negative for chills, diaphoresis and fever.  Respiratory: Negative for cough, shortness of breath and wheezing.   Cardiovascular: Negative for chest pain, palpitations and leg swelling.  Gastrointestinal: Negative for abdominal pain, constipation, diarrhea, nausea and vomiting.  Genitourinary: Negative for frequency and urgency.  Neurological: Negative for dizziness, focal weakness, weakness and headaches.  All other systems reviewed and are negative.   VITAL SIGNS: Temp:  [97.8 F (36.6 C)-98.1 F (36.7 C)] 98 F (36.7 C) (01/28 1050) Pulse Rate:  [60-86] 80 (01/28 1100) Resp:  [15-27] 27 (01/28 1100) BP: (99-133)/(57-93) 122/76 (01/28 1100) SpO2:  [98 %-100 %] 98 % (01/28 1100) Weight:  [50.8 kg] 50.8 kg (01/28 1050)  Physical Examination:  GENERAL: Ill appearing, in no acute distress HEAD: Normocephalic, atraumatic.  EYES: Pupils equal, round, reactive to light.  No scleral icterus.  MOUTH: Moist mucosal membrane. NECK: Supple. No JVD.  PULMONARY: Clear lung sounds bilaterally CARDIOVASCULAR: S1 and S2. Regular rate and rhythm. No murmurs, rubs, or gallops.  GASTROINTESTINAL: Soft, nontender, -distended. No  masses. Positive bowel sounds. No hepatosplenomegaly.  MUSCULOSKELETAL: No swelling, clubbing, or edema.  NEUROLOGIC: Awake and alert SKIN: Intact,warm,dry. Wound dressing on left arm and hand.   ASSESSMENT / PLAN:  Patient is a 28 y.o. male presents to ICU Stepdown with hyperglycemia on sliding-scale insulin and novolog 70/30 with history of DKA and non-compliance.    DKA with Hyperglycemia Aggressive IV fluid, 2L bolus Monitor BMP and CBG    Wallis Bamberg Santiago Glad, M.D.  [facility]  Medical Director [facility] Medical Director [facility] Cardio-Pulmonary Department

## 2018-08-18 NOTE — Progress Notes (Addendum)
[employer] - [place] [facility]   PATIENT NAME: Trevor Brown    MR#:  [id number]  DATE OF BIRTH:  [birth year]  SUBJECTIVE:  CHIEF COMPLAINT:   Chief Complaint  Patient presents with  . Hyperglycemia   The patient complains of generalized weakness.  He looks drowsy and lethargic. BS was up to 583 this am. REVIEW OF SYSTEMS:  Review of Systems  Constitutional: Positive for malaise/fatigue. Negative for chills and fever.  HENT: Negative for sore throat.   Eyes: Negative for blurred vision and double vision.  Respiratory: Negative for cough, hemoptysis, shortness of breath, wheezing and stridor.   Cardiovascular: Negative for chest pain, palpitations, orthopnea and leg swelling.  Gastrointestinal: Positive for nausea and vomiting. Negative for abdominal pain, blood in stool, diarrhea and melena.  Genitourinary: Negative for dysuria, flank pain and hematuria.  Musculoskeletal: Negative for back pain and joint pain.  Skin: Negative for rash.  Neurological: Negative for dizziness, sensory change, focal weakness, seizures, loss of consciousness, weakness and headaches.  Endo/Heme/Allergies: Negative for polydipsia.  Psychiatric/Behavioral: Negative for depression. The patient is not nervous/anxious.     DRUG ALLERGIES:   Allergies  Allergen Reactions  . Bee Venom Anaphylaxis  . Penicillins Anaphylaxis, Hives and Other (See Comments)    Has patient had a PCN reaction causing immediate rash, facial/tongue/throat swelling, SOB or lightheadedness with hypotension: Yes Has patient had a PCN reaction causing severe rash involving mucus membranes or skin necrosis: No Has patient had a PCN reaction that required hospitalization No Has patient had a PCN reaction occurring within the last 10 years: Yes If all of the above answers are "NO", then may proceed with Cephalosporin use.  . Ibuprofen Other (See Comments)    Other reaction(s): Other (See Comments) Stomach  upset Reaction:  GI upset   . Tramadol Hives and Swelling  . Vancomycin Rash and Other (See Comments)    Reaction:  Red man's syndrome    VITALS:  Blood pressure 122/76, pulse 80, temperature 98 F (36.7 C), temperature source Oral, resp. rate (!) 27, height 5\' 4"  (1.626 m), weight 50.8 kg, SpO2 98 %. PHYSICAL EXAMINATION:  Physical Exam Constitutional:      General: He is not in acute distress. HENT:     Head: Normocephalic.     Mouth/Throat:     Mouth: Mucous membranes are moist.     Pharynx: No oropharyngeal exudate.  Eyes:     General: No scleral icterus.    Conjunctiva/sclera: Conjunctivae normal.     Pupils: Pupils are equal, round, and reactive to light.  Neck:     Musculoskeletal: Normal range of motion and neck supple.     Vascular: No JVD.     Trachea: No tracheal deviation.  Cardiovascular:     Rate and Rhythm: Normal rate and regular rhythm.     Heart sounds: Normal heart sounds. No murmur. No gallop.   Pulmonary:     Effort: Pulmonary effort is normal. No respiratory distress.     Breath sounds: Normal breath sounds. No wheezing or rales.  Abdominal:     General: Bowel sounds are normal. There is no distension.     Palpations: Abdomen is soft.     Tenderness: There is no abdominal tenderness. There is no rebound.  Musculoskeletal: Normal range of motion.        General: No tenderness.     Right lower leg: No edema.     Left lower leg: No edema.  Comments: Left arm in dressing.  Skin:    Findings: No erythema or rash.  Neurological:     General: No focal deficit present.     Mental Status: He is alert and oriented to person, place, and time.     Cranial Nerves: No cranial nerve deficit.  Psychiatric:        Mood and Affect: Mood normal.    LABORATORY PANEL:  Male CBC Recent Labs  Lab 08/18/18 0613  WBC 11.2*  HGB 12.9*  HCT 40.2  PLT 334    ------------------------------------------------------------------------------------------------------------------ Chemistries  Recent Labs  Lab 08/17/18 1117  08/18/18 1002  NA 128*   < > 131*  K 4.9   < > 4.0  CL 89*   < > 98  CO2 15*   < > 16*  GLUCOSE 565*   < > 345*  BUN 17   < > 18  CREATININE 1.13   < > 1.00  CALCIUM 9.0   < > 8.5*  AST 188*  --   --   ALT 166*  --   --   ALKPHOS 247*  --   --   BILITOT 2.0*  --   --    < > = values in this interval not displayed.   RADIOLOGY:  Ct Abdomen Pelvis Wo Contrast  Result Date: 08/17/2018 CLINICAL DATA:  Seen in the ED for blood glucose of 565. Left abdominal pain also noted. Normal WBC. Patient in DKA has left lower quadrant pain and markedly elevated liver functions much higher than usual EXAM: CT ABDOMEN AND PELVIS WITHOUT CONTRAST TECHNIQUE: Multidetector CT imaging of the abdomen and pelvis was performed following the standard protocol without IV contrast. COMPARISON:  04/09/2016 FINDINGS: Lower chest: No pleural or pericardial effusion. Visualized lung bases clear. Hepatobiliary: No focal liver abnormality is seen. No gallstones, gallbladder wall thickening, or biliary dilatation. Pancreas: Unremarkable. No pancreatic ductal dilatation or surrounding inflammatory changes. Spleen: Normal in size without focal abnormality. Adrenals/Urinary Tract: Normal adrenal glands. 1 mm calcification in the lower pole right renal collecting system. No hydronephrosis. Urinary bladder physiologically distended. Stomach/Bowel: Stomach and small bowel are nondilated. Incomplete passage of oral contrast material through the distal small bowel. Moderate colonic fecal material without dilatation. Vascular/Lymphatic: No significant vascular findings are present. No enlarged abdominal or pelvic lymph nodes. Reproductive: Prostate is unremarkable. Other: No ascites. No free air. Musculoskeletal: No acute or significant osseous findings. IMPRESSION: 1. No  acute abdominal process. 2. Stable right nephrolithiasis without hydronephrosis. Electronically Signed   By: Corlis Leak  Hassell M.D.   On: 08/17/2018 14:37   ASSESSMENT AND PLAN:   *DKA Due to noncompliance. He is off insulin IV drip and started sliding scale and novolog 70/30.  The patient developed DKA again with anion gap up to 20 and blood sugar is more than 500. The patient is transferred to stepdown unit start insulin drip with IV fluid support.  *Hypo-natremia Due to DKA and hypoglycemia, continue to monitor with IV fluids.  *Abscess and cellulitis on left hand ER physician did drainage. Cultures from the drainage was sent. As patient is IV drug user, follow up blood cultures. Continue doxycycline.  *Elevated LFTs and bilirubin This could be explained by his hepatitis C, continue to monitor.  *Tobacco abuse-active smoking Counseled against smoking for 4 minutes and offered nicotine patch.  *IV drug use Last use was 4 days ago, currently no withdrawal symptoms. I counseled him again.  I discussed with Dr. Belia HemanKasa. All the records are  reviewed and case discussed with Care Management/Social Worker. Management plans discussed with the patient, family and they are in agreement.  CODE STATUS: Full Code  I PERSONALLY SPENT TOTAL CRITICAL TIME TAKING CARE OF THIS PATIENT: 36 minutes.   More than 50% of the time was spent in counseling/coordination of care: YES  POSSIBLE D/C IN 2 DAYS, DEPENDING ON CLINICAL CONDITION.   Shaune Pollack M.D on 08/18/2018 at 11:23 AM  Between 7am to 6pm - Pager - [phone]  After 6pm go to [email or web address] - Therapist, nutritional Hospitalists

## 2018-08-18 NOTE — Progress Notes (Signed)
Pharmacy Electrolyte Monitoring Consult:  Pharmacy consulted to assist in monitoring and replacing electrolytes in this 28 y.o. male admitted on 08/17/2018 with Hyperglycemia  Patient was started on DKA insulin drip protocol. Insulin drip has been discontinued and patient is transitioned to Novolog 70/30 regimen.   Labs:  Sodium (mmol/L)  Date Value  08/18/2018 132 (L)  12/31/2016 138  04/06/2014 137   Potassium (mmol/L)  Date Value  08/18/2018 3.5  04/06/2014 3.8   Magnesium (mg/dL)  Date Value  53/20/2334 2.1  12/12/2013 1.6 (L)   Phosphorus (mg/dL)  Date Value  35/68/6168 3.6  12/10/2013 2.6   Calcium (mg/dL)  Date Value  37/29/0211 7.8 (L)   Calcium, Total (mg/dL)  Date Value  15/52/0802 8.1 (L)   Albumin (g/dL)  Date Value  23/36/1224 4.0  12/31/2016 4.3  04/05/2014 3.4    Assessment/Plan: 01/28 13:35 K 3.5. Will replace w/ another KCI PO once. Recheck with AM labs.  Clovia Cuff, PharmD, BCPS 08/18/2018 2:49 PM

## 2018-08-18 NOTE — Care Management Note (Signed)
Case Management Note  Patient Details  Name: Trevor Brown MRN: [id number] Date of Birth: 07-06-1991  Subjective/Objective:   Patient admitted with DKA.  Patient is a type 1 diabetic who was unable to get his insulin before the weekend so he was without for 2 days before coming to the ED.  Patient is homeless, he reports he lives on the street, [facility] asked what he does at night when it is cold states "I bounce around".  Patient reports he has a brother but he is homeless also in this area.  Patient says he also has a sister who lives in [place] in a trailer, her name is Conservation officer, historic buildings.  Patient reports that he could probably stay with her.  Patient currently has no job and no income.  Patient gets his insulin from [facility], and his prescription for insulin from the hospital.  Patient does not have a PCP and has been given info on [facility]- he is interested in getting set up with the clinic.  Patient reports he has been to DSS and he gets food stamps.  He only has the belongings in the room with him which are some clothes and a jacket.  Patient reports he has no phone.  Social work consult placed for substance abuse.  [facility] application and referral made.  Robbie Lis RN BSN [phone]                  Action/Plan:   Expected Discharge Date:                  Expected Discharge Plan:     In-House Referral:     Discharge planning Services     Post Acute Care Choice:    Choice offered to:     DME Arranged:    DME Agency:     HH Arranged:    HH Agency:     Status of Service:  In process, will continue to follow  If discussed at Long Length of Stay Meetings, dates discussed:    Additional Comments:  Allayne Butcher, RN 08/18/2018, 11:24 AM

## 2018-08-18 NOTE — Progress Notes (Signed)
Report given to [place], Charity fundraiser. Pt transport to ICU-12 all pt belongings packed up and transported with pt with no issues.   Trevor Brown [employer]

## 2018-08-18 NOTE — Progress Notes (Signed)
Pt being moved to room 215.  Report has been called.  Pt is alert and oriented with no complaints of pain.  Pt transported by wheelchair.

## 2018-08-19 LAB — COMPREHENSIVE METABOLIC PANEL
ALT: 102 U/L — ABNORMAL HIGH (ref 0–44)
AST: 123 U/L — ABNORMAL HIGH (ref 15–41)
Albumin: 2.8 g/dL — ABNORMAL LOW (ref 3.5–5.0)
Alkaline Phosphatase: 163 U/L — ABNORMAL HIGH (ref 38–126)
Anion gap: 6 (ref 5–15)
BUN: 15 mg/dL (ref 6–20)
CO2: 23 mmol/L (ref 22–32)
Calcium: 7.9 mg/dL — ABNORMAL LOW (ref 8.9–10.3)
Chloride: 103 mmol/L (ref 98–111)
Creatinine, Ser: 0.54 mg/dL — ABNORMAL LOW (ref 0.61–1.24)
GFR calc Af Amer: >60 mL/min (ref >60)
GFR calc non Af Amer: >60 mL/min (ref >60)
Glucose, Bld: 293 mg/dL — ABNORMAL HIGH (ref 70–99)
Potassium: 3.6 mmol/L (ref 3.5–5.1)
Sodium: 132 mmol/L — ABNORMAL LOW (ref 135–145)
Total Bilirubin: 0.4 mg/dL (ref 0.3–1.2)
Total Protein: 5.7 g/dL — ABNORMAL LOW (ref 6.5–8.1)

## 2018-08-19 LAB — GLUCOSE, CAPILLARY
Glucose-Capillary: 470 mg/dL — ABNORMAL HIGH (ref 70–99)
Glucose-Capillary: 170 mg/dL — ABNORMAL HIGH (ref 70–99)
Glucose-Capillary: 144 mg/dL — ABNORMAL HIGH (ref 70–99)
Glucose-Capillary: 196 mg/dL — ABNORMAL HIGH (ref 70–99)

## 2018-08-19 LAB — HIV ANTIBODY (ROUTINE TESTING W REFLEX): HIV Screen 4th Generation wRfx: NONREACTIVE

## 2018-08-19 MED ORDER — INSULIN ASPART PROT & ASPART (70-30 MIX) 100 UNIT/ML ~~LOC~~ SUSP
30.0000 [IU] | Freq: Every day | SUBCUTANEOUS | Status: DC
  Administered 2018-08-19: 30 [IU] via SUBCUTANEOUS
  Filled 2018-08-19: qty 10

## 2018-08-19 MED ORDER — INSULIN ASPART 100 UNIT/ML ~~LOC~~ SOLN: 5.0000 [IU] | Freq: Three times a day (TID) | SUBCUTANEOUS | Status: DC

## 2018-08-19 MED ORDER — INSULIN ASPART 100 UNIT/ML ~~LOC~~ SOLN
5.0000 [IU] | Freq: Three times a day (TID) | SUBCUTANEOUS | Status: DC
  Administered 2018-08-19 – 2018-08-20 (×6): 5 [IU] via SUBCUTANEOUS
  Filled 2018-08-19 (×6): qty 1

## 2018-08-19 MED ORDER — INSULIN ASPART PROT & ASPART (70-30 MIX) 100 UNIT/ML ~~LOC~~ SUSP
28.0000 [IU] | Freq: Every day | SUBCUTANEOUS | Status: DC
  Administered 2018-08-19: 28 [IU] via SUBCUTANEOUS
  Filled 2018-08-19: qty 10

## 2018-08-19 MED ORDER — POTASSIUM CHLORIDE CRYS ER 20 MEQ PO TBCR
40.0000 meq | EXTENDED_RELEASE_TABLET | Freq: Once | ORAL | Status: AC
  Administered 2018-08-19: 40 meq via ORAL
  Filled 2018-08-19: qty 2

## 2018-08-19 NOTE — Progress Notes (Signed)
Pharmacy Electrolyte Monitoring Consult:  Pharmacy consulted to assist in monitoring and replacing electrolytes in this 28 y.o. male admitted on 08/17/2018 with Hyperglycemia  Patient was started on DKA insulin drip protocol. Insulin drip has been discontinued and patient is transitioned to Novolog 70/30 regimen.   Labs:  Sodium (mmol/L)  Date Value  08/19/2018 132 (L)  12/31/2016 138  04/06/2014 137   Potassium (mmol/L)  Date Value  08/19/2018 3.6  04/06/2014 3.8   Magnesium (mg/dL)  Date Value  99/24/2683 2.1  12/12/2013 1.6 (L)   Phosphorus (mg/dL)  Date Value  41/96/2229 3.6  12/10/2013 2.6   Calcium (mg/dL)  Date Value  79/89/2119 7.9 (L)   Calcium, Total (mg/dL)  Date Value  41/74/0814 8.1 (L)   Albumin (g/dL)  Date Value  48/18/5631 2.8 (L)  12/31/2016 4.3  04/05/2014 3.4    Assessment/Plan: 01/28 13:35 K 3.6. Will replace with KCI PO once. Recheck with AM labs.  Abbie Sons, PharmD 08/19/2018 7:09 AM

## 2018-08-19 NOTE — Clinical Social Work Note (Signed)
Clinical Social Work Assessment  Patient Details  Name: Trevor Brown MRN: [id number] Date of Birth: 16-Jul-1991  Date of referral:  08/19/18               Reason for consult:  Substance Use/ETOH Abuse, Housing Concerns/Homelessness                Permission sought to share information with:    Permission granted to share information::     Name::        Agency::     Relationship::     Contact Information:     Housing/Transportation Living arrangements for the past 2 months:  No permanent address Source of Information:  Patient Patient Interpreter Needed:  None Criminal Activity/Legal Involvement Pertinent to Current Situation/Hospitalization:    Significant Relationships:  Other Family Members, Siblings Lives with:  Friends Do you feel safe going back to the place where you live?  Yes Need for family participation in patient care:  No (Coment)  Care giving concerns:  Patient is able to complete ADL's himself and is able to find resources when he wants to.    Social Worker assessment / plan:  CSW met with patient this morning. CSW explained role and purpose of visit. Patient was quiet in his demeanor and had somewhat of a flat affect. Patient explained that he has been bouncing from different friends' homes and does not have stable housing. When CSW discussed the possibility of going to the homeless shelter, patient was not interested. He has been to the homeless shelter in the distant past. He stated he would have a place to go to at discharge.   Patient brought up the loss of his dad a few days ago. He stated that his dad abused drugs and that he died from a heart attack which is feels is from his drug use. 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 He states he has never been to drug rehab but he is not interested.   CSW offered grief counseling and patient stated that he would be interested in this. CSW to supply  potential resources for this. CSW confirmed that transportation is an issue and that he typically will find a ride if he feels it is necessary.    Employment status:  Unemployed Forensic scientist:  Self Pay (Medicaid Pending) PT Recommendations:    Information / Referral to community resources:     Patient/Family's Response to care:  Patient appreciated CSW visit.  Patient/Family's Understanding of and Emotional Response to Diagnosis, Current Treatment, and Prognosis:  Patient is undergoing the grieving process due to the recent loss of his dad.   Emotional Assessment Appearance:  Appears stated age Attitude/Demeanor/Rapport:  Grieving Affect (typically observed):  Calm, Flat Orientation:  Oriented to Self, Oriented to Place, Oriented to  Time, Oriented to Situation Alcohol / Substance use:  Illicit Drugs Psych involvement (Current and /or in the community):  No (Comment)  Discharge Needs  Concerns to be addressed:  Grief and Loss Concerns, Substance Abuse Concerns, Other (Comment Required(unstable housing) Readmission within the last 30 days:  No Current discharge risk:  Substance Abuse Barriers to Discharge:  No Barriers Identified   Shela Leff, LCSW 08/19/2018, 11:49 AM

## 2018-08-19 NOTE — Progress Notes (Addendum)
Inpatient Diabetes Program Recommendations  AACE/ADA: New Consensus Statement on Inpatient Glycemic Control   Target Ranges:  Prepandial:   less than 140 mg/dL      Peak postprandial:   less than 180 mg/dL (1-2 hours)      Critically ill patients:  140 - 180 mg/dL   Results for Trevor Brown, Trevor Brown (MRN [id number]) as of 08/19/2018 07:47  Ref. Range 08/18/2018 08:00 08/18/2018 09:48 08/18/2018 10:49 08/18/2018 12:00 08/18/2018 16:46 08/18/2018 21:31 08/18/2018 23:20  Glucose-Capillary Latest Ref Range: 70 - 99 mg/dL 122 (H) 482 (H) 500 (H) 224 (H) 358 (H) 377 (H) 251 (H)   Review of Glycemic Control  Diabetes history: DM1 (makes NO insulin; requires basal, correction, and meal coverage insulin) Outpatient Diabetes medications: 70/30 22 units QAM, 70/30 23 units QPM, Novolog 5-10 units TID with meals Current orders for Inpatient glycemic control: 70/30 22 units QAM, 70/30 23 units QPM, Novolog 0-15 units TID with meals, Novolog 0-5 units QHS  Inpatient Diabetes Program Recommendations:  Insulin - Basal: Please consider increasing 70/30 to 30 units QAM and 28 units QPM.   Addendum 08/19/18@13 :30-Went by to speak with patient regarding DM control. Patient states that he has been out of insulin for a couple days but he notes that [facility]) to get everything he needs.  Stressed to patient that he absolutely has to take insulin and asked that he be aware of how much insulin he has and plan ahead to get to the [facility] prior to running out of insulin. Patient notes that he is using vial/syringe for insulin injections and he states that he gives them to himself in his arms most all the time (uses the right arm more than the left).  Examined patient's arms and noted some hardened areas on them. Explained to the patient that insulin may not be absorbing if given in scar tissue. Encouraged patient to use abdominal area for insulin injections making sure to rotate insulin injection sites.   Patient has been told this in the past as well. Explained to patient that nursing has been asked to administer his insulin in his abdomen while inpatient and patient agreeable. Encouraged patient to be sure to get everything he needs from [facility] and to take his insulin consistently and rotate sites in the abdominal area. Patient verbalized understanding of information discussed and he notes that he has no further questions at this time related to DM.   Thanks, Orlando Penner, RN, MSN, CDE Diabetes Coordinator Inpatient Diabetes Program [phone] (Team Pager from 8am to 5pm)

## 2018-08-19 NOTE — Progress Notes (Signed)
[facility] - [place] at [facility]   PATIENT NAME: Trevor Brown    MR#:  [id number]  DATE OF BIRTH:  1991/06/14  SUBJECTIVE:  CHIEF COMPLAINT:   Chief Complaint  Patient presents with  . Hyperglycemia   The patient complains of generalized weakness. BS was up to 470 this am. REVIEW OF SYSTEMS:  Review of Systems  Constitutional: Positive for malaise/fatigue. Negative for chills and fever.  HENT: Negative for sore throat.   Eyes: Negative for blurred vision and double vision.  Respiratory: Negative for cough, hemoptysis, shortness of breath, wheezing and stridor.   Cardiovascular: Negative for chest pain, palpitations, orthopnea and leg swelling.  Gastrointestinal: Negative for abdominal pain, blood in stool, diarrhea, melena, nausea and vomiting.  Genitourinary: Negative for dysuria, flank pain and hematuria.  Musculoskeletal: Negative for back pain and joint pain.  Skin: Negative for rash.  Neurological: Negative for dizziness, sensory change, focal weakness, seizures, loss of consciousness, weakness and headaches.  Endo/Heme/Allergies: Negative for polydipsia.  Psychiatric/Behavioral: Negative for depression. The patient is not nervous/anxious.     DRUG ALLERGIES:   Allergies  Allergen Reactions  . Bee Venom Anaphylaxis  . Penicillins Anaphylaxis, Hives and Other (See Comments)    Has patient had a PCN reaction causing immediate rash, facial/tongue/throat swelling, SOB or lightheadedness with hypotension: Yes Has patient had a PCN reaction causing severe rash involving mucus membranes or skin necrosis: No Has patient had a PCN reaction that required hospitalization No Has patient had a PCN reaction occurring within the last 10 years: Yes If all of the above answers are "NO", then may proceed with Cephalosporin use.  . Ibuprofen Other (See Comments)    Other reaction(s): Other (See Comments) Stomach upset Reaction:  GI upset   . Tramadol Hives and  Swelling  . Vancomycin Rash and Other (See Comments)    Reaction:  Red man's syndrome    VITALS:  Blood pressure 115/77, pulse 68, temperature 98.9 F (37.2 C), temperature source Oral, resp. rate (!) 21, height 5\' 4"  (1.626 m), weight 50.8 kg, SpO2 99 %. PHYSICAL EXAMINATION:  Physical Exam Constitutional:      General: He is not in acute distress. HENT:     Head: Normocephalic.     Mouth/Throat:     Mouth: Mucous membranes are moist.     Pharynx: No oropharyngeal exudate.  Eyes:     General: No scleral icterus.    Conjunctiva/sclera: Conjunctivae normal.     Pupils: Pupils are equal, round, and reactive to light.  Neck:     Musculoskeletal: Normal range of motion and neck supple.     Vascular: No JVD.     Trachea: No tracheal deviation.  Cardiovascular:     Rate and Rhythm: Normal rate and regular rhythm.     Heart sounds: Normal heart sounds. No murmur. No gallop.   Pulmonary:     Effort: Pulmonary effort is normal. No respiratory distress.     Breath sounds: Normal breath sounds. No wheezing or rales.  Abdominal:     General: Bowel sounds are normal. There is no distension.     Palpations: Abdomen is soft.     Tenderness: There is no abdominal tenderness. There is no rebound.  Musculoskeletal: Normal range of motion.        General: No tenderness.     Right lower leg: No edema.     Left lower leg: No edema.     Comments: Left arm in dressing.  Skin:    Findings: No erythema or rash.  Neurological:     General: No focal deficit present.     Mental Status: He is alert and oriented to person, place, and time.     Cranial Nerves: No cranial nerve deficit.  Psychiatric:        Mood and Affect: Mood normal.    LABORATORY PANEL:  Male CBC Recent Labs  Lab 08/18/18 0613  WBC 11.2*  HGB 12.9*  HCT 40.2  PLT 334   ------------------------------------------------------------------------------------------------------------------ Chemistries  Recent Labs  Lab  08/19/18 0320  NA 132*  K 3.6  CL 103  CO2 23  GLUCOSE 293*  BUN 15  CREATININE 0.54*  CALCIUM 7.9*  AST 123*  ALT 102*  ALKPHOS 163*  BILITOT 0.4   RADIOLOGY:  No results found. ASSESSMENT AND PLAN:   *DKA Due to noncompliance. He is off insulin IV drip and started sliding scale and novolog 70/30.  The patient developed DKA again with anion gap up to 20 and blood sugar was more than 500. The patient is transferred to stepdown unit start insulin drip with IV fluid support.  DKA improved.  Hyperglycemia due to diabetes Increased doses of NovoLog 70/30.  Continue sliding scale with NovoLog AC.  *Hyponatremia Due to DKA and hyperglycemia, continue to monitor with IV fluids.  *Abscess and cellulitis on left hand ER physician did drainage. Cultures from the drainage was sent. As patient is IV drug user, negative blood cultures so far. Continue doxycycline.  *Elevated LFTs and bilirubin This could be explained by his hepatitis C, better.  *Tobacco abuse-active smoking Counseled against smoking for 4 minutes and offered nicotine patch.  *IV drug use Last use was 6 days ago, currently no withdrawal symptoms.  All the records are reviewed and case discussed with Care Management/Social Worker. Management plans discussed with the patient, family and they are in agreement.  CODE STATUS: Full Code  TOTAL CRITICAL TIME TAKING CARE OF THIS PATIENT: 28 minutes.   More than 50% of the time was spent in counseling/coordination of care: YES  POSSIBLE D/C IN 2 DAYS, DEPENDING ON CLINICAL CONDITION.   Shaune Pollack M.D on 08/19/2018 at 1:10 PM  Between 7am to 6pm - Pager - [phone]  After 6pm go to [email or web address] - Therapist, nutritional Hospitalists

## 2018-08-20 LAB — GLUCOSE, CAPILLARY
Glucose-Capillary: 326 mg/dL — ABNORMAL HIGH (ref 70–99)
Glucose-Capillary: 343 mg/dL — ABNORMAL HIGH (ref 70–99)
Glucose-Capillary: 150 mg/dL — ABNORMAL HIGH (ref 70–99)
Glucose-Capillary: 196 mg/dL — ABNORMAL HIGH (ref 70–99)

## 2018-08-20 LAB — BASIC METABOLIC PANEL
Anion gap: 7 (ref 5–15)
BUN: 13 mg/dL (ref 6–20)
CO2: 24 mmol/L (ref 22–32)
Calcium: 8.2 mg/dL — ABNORMAL LOW (ref 8.9–10.3)
Chloride: 101 mmol/L (ref 98–111)
Creatinine, Ser: 0.77 mg/dL (ref 0.61–1.24)
GFR calc Af Amer: >60 mL/min (ref >60)
GFR calc non Af Amer: >60 mL/min (ref >60)
Glucose, Bld: 332 mg/dL — ABNORMAL HIGH (ref 70–99)
Potassium: 4.3 mmol/L (ref 3.5–5.1)
Sodium: 132 mmol/L — ABNORMAL LOW (ref 135–145)

## 2018-08-20 LAB — AEROBIC CULTURE W GRAM STAIN (SUPERFICIAL SPECIMEN)

## 2018-08-20 LAB — POTASSIUM: Potassium: 4.3 mmol/L (ref 3.5–5.1)

## 2018-08-20 LAB — AEROBIC CULTURE  (SUPERFICIAL SPECIMEN)

## 2018-08-20 MED ORDER — SULFAMETHOXAZOLE-TRIMETHOPRIM 800-160 MG PO TABS
2.0000 | ORAL_TABLET | Freq: Two times a day (BID) | ORAL | Status: DC
  Administered 2018-08-20 – 2018-08-22 (×5): 2 via ORAL
  Filled 2018-08-20 (×7): qty 2

## 2018-08-20 MED ORDER — INSULIN ASPART PROT & ASPART (70-30 MIX) 100 UNIT/ML ~~LOC~~ SUSP: 34.0000 [IU] | Freq: Every day | SUBCUTANEOUS | Status: DC

## 2018-08-20 MED ORDER — SODIUM CHLORIDE 0.9 % IV SOLN
INTRAVENOUS | Status: DC
  Administered 2018-08-20 – 2018-08-21 (×3): via INTRAVENOUS

## 2018-08-20 MED ORDER — INSULIN ASPART PROT & ASPART (70-30 MIX) 100 UNIT/ML ~~LOC~~ SUSP: SUBCUTANEOUS | 0 refills | Status: DC

## 2018-08-20 MED ORDER — INSULIN ASPART PROT & ASPART (70-30 MIX) 100 UNIT/ML ~~LOC~~ SUSP
30.0000 [IU] | Freq: Every day | SUBCUTANEOUS | Status: DC
  Administered 2018-08-20 – 2018-08-21 (×2): 30 [IU] via SUBCUTANEOUS
  Filled 2018-08-20 (×2): qty 10

## 2018-08-20 MED ORDER — CEFPODOXIME PROXETIL 200 MG PO TABS
400.0000 mg | ORAL_TABLET | Freq: Two times a day (BID) | ORAL | Status: DC
  Administered 2018-08-20 – 2018-08-21 (×3): 400 mg via ORAL
  Filled 2018-08-20 (×3): qty 2

## 2018-08-20 NOTE — Progress Notes (Signed)
Inpatient Diabetes Program Recommendations  AACE/ADA: New Consensus Statement on Inpatient Glycemic Control   Target Ranges:  Prepandial:   less than 140 mg/dL      Peak postprandial:   less than 180 mg/dL (1-2 hours)      Critically ill patients:  140 - 180 mg/dL   Results for Trevor Brown, Trevor Brown (MRN [id number]) as of 08/20/2018 08:01  Ref. Range 08/19/2018 07:56 08/19/2018 12:11 08/19/2018 16:33 08/19/2018 22:27 08/20/2018 07:51  Glucose-Capillary Latest Ref Range: 70 - 99 mg/dL 975 (H)  88/32 30 units  Novolog 20 units 170 (H)  Novolog 8 units 144 (H)  70/30 28 units  Novolog 7 units 196 (H) 326 (H)   Review of Glycemic Control  Diabetes history: DM1 (makes NO insulin; requires basal, correction, and meal coverage insulin) Outpatient Diabetes medications: 70/30 22 units QAM, 70/30 23 units QPM, Novolog 5-10 units TID with meals Current orders for Inpatient glycemic control: 70/30 30 units QAM, 70/30 28 units QPM, Novolog 0-15 units TID with meals, Novolog 0-5 units QHS, Novolog 5 units TID with meals  Inpatient Diabetes Program Recommendations:  Insulin- Please consider increasing 70/30 to 34 units QAM and 70/30 30 units QPM.  NOTE: Patient has been receiving insulin injections in abdominal tissue (rotating site) versus in his arms and glucose improved with using his abdomen to administer insulin.  Have asked nursing to only use abdomen to administer insulin while inpatient and also asked patient to start use abdomen for insulin injections as an outpatient.  Fasting glucose 326 mg/dl today. Recommend increasing 70/30 dosages slightly as noted above.  Thanks, Orlando Penner, RN, MSN, CDE Diabetes Coordinator Inpatient Diabetes Program [phone] (Team Pager from 8am to 5pm)

## 2018-08-20 NOTE — Progress Notes (Addendum)
[facility] - [place] at [facility]   PATIENT NAME: Trevor Brown    MR#:  [id number]  DATE OF BIRTH:  1991-04-29  SUBJECTIVE:  CHIEF COMPLAINT:   Chief Complaint  Patient presents with  . Hyperglycemia   The patient complains of generalized weakness.  Still hyperglycemia. REVIEW OF SYSTEMS:  Review of Systems  Constitutional: Positive for malaise/fatigue. Negative for chills and fever.  HENT: Negative for sore throat.   Eyes: Negative for blurred vision and double vision.  Respiratory: Negative for cough, hemoptysis, shortness of breath, wheezing and stridor.   Cardiovascular: Negative for chest pain, palpitations, orthopnea and leg swelling.  Gastrointestinal: Negative for abdominal pain, blood in stool, diarrhea, melena, nausea and vomiting.  Genitourinary: Negative for dysuria, flank pain and hematuria.  Musculoskeletal: Negative for back pain and joint pain.  Skin: Negative for rash.  Neurological: Negative for dizziness, sensory change, focal weakness, seizures, loss of consciousness, weakness and headaches.  Endo/Heme/Allergies: Negative for polydipsia.  Psychiatric/Behavioral: Negative for depression. The patient is not nervous/anxious.     DRUG ALLERGIES:   Allergies  Allergen Reactions  . Bee Venom Anaphylaxis  . Penicillins Anaphylaxis, Hives and Other (See Comments)    Has patient had a PCN reaction causing immediate rash, facial/tongue/throat swelling, SOB or lightheadedness with hypotension: Yes Has patient had a PCN reaction causing severe rash involving mucus membranes or skin necrosis: No Has patient had a PCN reaction that required hospitalization No Has patient had a PCN reaction occurring within the last 10 years: Yes If all of the above answers are "NO", then may proceed with Cephalosporin use.  . Ibuprofen Other (See Comments)    Other reaction(s): Other (See Comments) Stomach upset Reaction:  GI upset   . Tramadol Hives and  Swelling  . Vancomycin Rash and Other (See Comments)    Reaction:  Red man's syndrome    VITALS:  Blood pressure (!) 135/92, pulse 65, temperature 97.6 F (36.4 C), temperature source Axillary, resp. rate 19, height 5\' 4"  (1.626 m), weight 50.8 kg, SpO2 99 %. PHYSICAL EXAMINATION:  Physical Exam Constitutional:      General: He is not in acute distress. HENT:     Head: Normocephalic.     Mouth/Throat:     Mouth: Mucous membranes are moist.     Pharynx: No oropharyngeal exudate.  Eyes:     General: No scleral icterus.    Conjunctiva/sclera: Conjunctivae normal.     Pupils: Pupils are equal, round, and reactive to light.  Neck:     Musculoskeletal: Normal range of motion and neck supple.     Vascular: No JVD.     Trachea: No tracheal deviation.  Cardiovascular:     Rate and Rhythm: Normal rate and regular rhythm.     Heart sounds: Normal heart sounds. No murmur. No gallop.   Pulmonary:     Effort: Pulmonary effort is normal. No respiratory distress.     Breath sounds: Normal breath sounds. No wheezing or rales.  Abdominal:     General: Bowel sounds are normal. There is no distension.     Palpations: Abdomen is soft.     Tenderness: There is no abdominal tenderness. There is no rebound.  Musculoskeletal: Normal range of motion.        General: No tenderness.     Right lower leg: No edema.     Left lower leg: No edema.     Comments: Left arm in dressing.  Skin:  Findings: No erythema or rash.  Neurological:     General: No focal deficit present.     Mental Status: He is alert and oriented to person, place, and time.     Cranial Nerves: No cranial nerve deficit.  Psychiatric:        Mood and Affect: Mood normal.    LABORATORY PANEL:  Male CBC Recent Labs  Lab 08/18/18 0613  WBC 11.2*  HGB 12.9*  HCT 40.2  PLT 334   ------------------------------------------------------------------------------------------------------------------ Chemistries  Recent Labs  Lab  08/19/18 0320 08/20/18 0728  NA 132* 132*  K 3.6 4.3  4.3  CL 103 101  CO2 23 24  GLUCOSE 293* 332*  BUN 15 13  CREATININE 0.54* 0.77  CALCIUM 7.9* 8.2*  AST 123*  --   ALT 102*  --   ALKPHOS 163*  --   BILITOT 0.4  --    RADIOLOGY:  No results found. ASSESSMENT AND PLAN:   *DKA Due to noncompliance. He is off insulin IV drip and started sliding scale and novolog 70/30.  The patient developed DKA again with anion gap up to 20 and blood sugar was more than 500. The patient is transferred to stepdown unit start insulin drip with IV fluid support.  DKA improved.  Hyperglycemia due to diabetes Increased doses of NovoLog 70/30 again.  Continue sliding scale with NovoLog AC.  *Hyponatremia Due to DKA and hyperglycemia, continue to monitor with IV fluids.  *Abscess and cellulitis on left hand ER physician did drainage. Cultures from the drainage: MODERATE SERRATIA MARCESCENS  MODERATE GROUP B STREP(S.AGALACTIAE)ISOLATED  MODERATE RAOULTELLA PLANTICOLA  As patient is IV drug user, negative blood cultures so far. Disontinue doxycycline. Change to Cefpodoxime and Bactrim for 10 days.  *Elevated LFTs and bilirubin This could be explained by his hepatitis C, better.  *Tobacco abuse-active smoking Counseled against smoking for 4 minutes and offered nicotine patch.  *IV drug use Last use was 6 days ago, currently no withdrawal symptoms.  All the records are reviewed and case discussed with Care Management/Social Worker. Management plans discussed with the patient, family and they are in agreement.  CODE STATUS: Full Code  TOTAL CRITICAL TIME TAKING CARE OF THIS PATIENT: 28 minutes.   More than 50% of the time was spent in counseling/coordination of care: YES  POSSIBLE D/C IN 2 DAYS, DEPENDING ON CLINICAL CONDITION.   Shaune PollackQing Audria Takeshita M.D on 08/20/2018 at 11:19 AM  Between 7am to 6pm - Pager - [phone]  After 6pm go to [email or web address] - Museum/gallery curatorpassword [email or web address]  [facility] [place] [facility]

## 2018-08-20 NOTE — Discharge Instructions (Signed)
Smoking and drug abuse cessation.

## 2018-08-20 NOTE — Progress Notes (Signed)
Blood glucose monitoring. Blood glucose level >300 twice. Blood glucose at the end of the shift was at in the 200ths.

## 2018-08-20 NOTE — Care Management (Signed)
RNCM follow up with both [facility] and [facility] .  Patient has not been to [facility]  Since June 2018.  Patient has only been filling medications at [facility]  When he has prescriptions from the hospital.  Per [facility]  They need updated financial information.   Patient confirms that he no longer has his meter.  Patient states "I lose them when I bounce around".  RNCM explained the importance of him having his meter.  With out the meter he is unable to appropriately dose his insulin.  RNCM notified patient that when ever he leaves the place he is staying that needs to be one of the first things he packs in his bags.    [facility] at [facility]  They can provide bus passes for transportation to and from appointments.  [facility]  Has provided a Bionime glucometer, 2 boxes of test strips, box of 100 lancets and a lancing devices, as well as 2 Link passes to get him to and from his appointment. These items have been provided to the patient at bedside. Patient states that he will follow through with appointment, and providing [facility]  With the information they require.  RNCM following for discharge medication needs.

## 2018-08-20 NOTE — Progress Notes (Signed)
Pharmacy Electrolyte Monitoring Consult:  Pharmacy consulted to assist in monitoring and replacing electrolytes in this 28 y.o. male admitted on 08/17/2018 with Hyperglycemia  Patient was started on DKA insulin drip protocol. Insulin drip has been discontinued and patient is transitioned to Novolog 70/30 regimen.   Labs:  Sodium (mmol/L)  Date Value  08/19/2018 132 (L)  12/31/2016 138  04/06/2014 137   Potassium (mmol/L)  Date Value  08/20/2018 4.3  04/06/2014 3.8   Magnesium (mg/dL)  Date Value  91/47/829512/03/2017 2.1  12/12/2013 1.6 (L)   Phosphorus (mg/dL)  Date Value  62/13/086502/18/2018 3.6  12/10/2013 2.6   Calcium (mg/dL)  Date Value  78/46/962901/29/2020 7.9 (L)   Calcium, Total (mg/dL)  Date Value  52/84/132409/16/2015 8.1 (L)   Albumin (g/dL)  Date Value  40/10/272501/29/2020 2.8 (L)  12/31/2016 4.3  04/05/2014 3.4    Assessment/Plan: 01/30 K 4.3, WNL.  Will hold replacement. Recheck with AM labs.  Abbie Sonshris Khari Lett, PharmD Clinical Pharmacist 08/20/2018 7:51 AM

## 2018-08-21 DIAGNOSIS — L02512 Cutaneous abscess of left hand: Secondary | ICD-10-CM

## 2018-08-21 LAB — GLUCOSE, CAPILLARY
Glucose-Capillary: 101 mg/dL — ABNORMAL HIGH (ref 70–99)
Glucose-Capillary: 145 mg/dL — ABNORMAL HIGH (ref 70–99)
Glucose-Capillary: 202 mg/dL — ABNORMAL HIGH (ref 70–99)
Glucose-Capillary: 339 mg/dL — ABNORMAL HIGH (ref 70–99)
Glucose-Capillary: 457 mg/dL — ABNORMAL HIGH (ref 70–99)
Glucose-Capillary: 458 mg/dL — ABNORMAL HIGH (ref 70–99)
Glucose-Capillary: 54 mg/dL — ABNORMAL LOW (ref 70–99)
Glucose-Capillary: 58 mg/dL — ABNORMAL LOW (ref 70–99)
Glucose-Capillary: 90 mg/dL (ref 70–99)

## 2018-08-21 LAB — BASIC METABOLIC PANEL
Anion gap: 8 (ref 5–15)
Anion gap: 10 (ref 5–15)
BUN: 21 mg/dL — ABNORMAL HIGH (ref 6–20)
BUN: 26 mg/dL — ABNORMAL HIGH (ref 6–20)
CO2: 23 mmol/L (ref 22–32)
CO2: 24 mmol/L (ref 22–32)
Calcium: 8.4 mg/dL — ABNORMAL LOW (ref 8.9–10.3)
Calcium: 8.9 mg/dL (ref 8.9–10.3)
Chloride: 95 mmol/L — ABNORMAL LOW (ref 98–111)
Chloride: 101 mmol/L (ref 98–111)
Creatinine, Ser: 0.85 mg/dL (ref 0.61–1.24)
Creatinine, Ser: 0.9 mg/dL (ref 0.61–1.24)
GFR calc Af Amer: >60 mL/min (ref >60)
GFR calc Af Amer: >60 mL/min (ref >60)
GFR calc non Af Amer: >60 mL/min (ref >60)
GFR calc non Af Amer: >60 mL/min (ref >60)
Glucose, Bld: 524 mg/dL (ref 70–99)
Glucose, Bld: 90 mg/dL (ref 70–99)
Potassium: 5.3 mmol/L — ABNORMAL HIGH (ref 3.5–5.1)
Potassium: 4.1 mmol/L (ref 3.5–5.1)
Sodium: 126 mmol/L — ABNORMAL LOW (ref 135–145)
Sodium: 135 mmol/L (ref 135–145)

## 2018-08-21 MED ORDER — LIDOCAINE HCL (PF) 2 % IJ SOLN
10.0000 mL | Freq: Once | INTRAMUSCULAR | Status: DC | PRN
  Filled 2018-08-21 (×2): qty 10

## 2018-08-21 MED ORDER — CEFDINIR 300 MG PO CAPS
300.0000 mg | ORAL_CAPSULE | Freq: Two times a day (BID) | ORAL | Status: DC
  Administered 2018-08-21 – 2018-08-22 (×3): 300 mg via ORAL
  Filled 2018-08-21 (×4): qty 1

## 2018-08-21 MED ORDER — INSULIN ASPART PROT & ASPART (70-30 MIX) 100 UNIT/ML ~~LOC~~ SUSP: SUBCUTANEOUS | 2 refills | Status: DC

## 2018-08-21 MED ORDER — INSULIN REGULAR HUMAN 100 UNIT/ML IJ SOLN
30.0000 [IU] | Freq: Once | INTRAMUSCULAR | Status: AC
  Administered 2018-08-21: 30 [IU] via SUBCUTANEOUS
  Filled 2018-08-21: qty 10

## 2018-08-21 MED ORDER — SULFAMETHOXAZOLE-TRIMETHOPRIM 800-160 MG PO TABS: 2.0000 | ORAL_TABLET | Freq: Two times a day (BID) | ORAL | 2 refills | Status: DC

## 2018-08-21 MED ORDER — CEFPODOXIME PROXETIL 200 MG PO TABS: 400.0000 mg | ORAL_TABLET | Freq: Two times a day (BID) | ORAL | 0 refills | Status: DC

## 2018-08-21 MED ORDER — SODIUM CHLORIDE 0.9 % IV SOLN
INTRAVENOUS | Status: DC
  Administered 2018-08-21 – 2018-08-22 (×4): via INTRAVENOUS

## 2018-08-21 MED ORDER — INSULIN ASPART 100 UNIT/ML ~~LOC~~ SOLN: 5.0000 [IU] | SUBCUTANEOUS | 0 refills | Status: DC

## 2018-08-21 MED ORDER — CEFDINIR 300 MG PO CAPS: 300.0000 mg | ORAL_CAPSULE | Freq: Two times a day (BID) | ORAL | 0 refills | Status: DC

## 2018-08-21 MED ORDER — INSULIN ASPART PROT & ASPART (70-30 MIX) 100 UNIT/ML ~~LOC~~ SUSP
40.0000 [IU] | Freq: Every day | SUBCUTANEOUS | Status: DC
  Administered 2018-08-21: 40 [IU] via SUBCUTANEOUS
  Filled 2018-08-21: qty 10

## 2018-08-21 MED ORDER — INSULIN ASPART 100 UNIT/ML ~~LOC~~ SOLN
8.0000 [IU] | Freq: Three times a day (TID) | SUBCUTANEOUS | Status: DC
  Administered 2018-08-21 (×3): 8 [IU] via SUBCUTANEOUS
  Filled 2018-08-21 (×3): qty 1

## 2018-08-21 NOTE — Progress Notes (Signed)
Inpatient Diabetes Program Recommendations  AACE/ADA: New Consensus Statement on Inpatient Glycemic Control   Target Ranges:  Prepandial:   less than 140 mg/dL      Peak postprandial:   less than 180 mg/dL (1-2 hours)      Critically ill patients:  140 - 180 mg/dL   Results for JEFTE, SEAWOOD (MRN [id number]) as of 08/21/2018 07:41  Ref. Range 08/20/2018 07:51 08/20/2018 11:38 08/20/2018 16:31 08/20/2018 21:19 08/21/2018 00:17 08/21/2018 03:48 08/21/2018 06:07  Glucose-Capillary Latest Ref Range: 70 - 99 mg/dL 174 (H)  Novolog 16 units (arm) 343 (H)  Novolog 16 units (arm) 150 (H)  Novolog 7 units (abdomen)  70/30 30 units (arm) 196 (H) 457 (H) 458 (H)   Regular 30 units (abdomen) 339 (H)   Review of Glycemic Control  Diabetes history:DM1 (makes NO insulin; requires basal, correction, and meal coverage insulin) Outpatient Diabetes medications:70/30 22 units QAM, 70/30 23 units QPM, Novolog 5-10 units TID with meals Current orders for Inpatient glycemic control:70/30 40 units QAM, 70/30 30 units QPM, Novolog 0-15 units TID with meals, Novolog 0-5 units QHS, Novolog 8 units TID with meals  Inpatient Diabetes Program Recommendations: Insulin- In reviewing chart, noted morning 70/30 was NOT GIVEN on 08/20/18. Also noted that insulin is still being given in arms despite request that only patient's abdomen be used for all insulin injections.  NOTE: As noted above, patient is still receiving insulin injections in his arms at times and it appears that insulin is NOT absorbed well when given in patient's arms.  Have asked NURSING to NOT ADMINISTER insulin in patient's arms.  NURSING, please only use abdomen to administer insulin while inpatient and also asked patient to start use abdomen for insulin injections as an outpatient.  Made Dr. Imogene Burn aware that patient did not receive morning dose of 70/30 on 08/20/18 and that insulin is likely not absorbing well when given in patient's  arms.  Thanks, Orlando Penner, RN, MSN, CDE Diabetes Coordinator Inpatient Diabetes Program [phone] (Team Pager from 8am to 5pm)

## 2018-08-21 NOTE — Consult Note (Signed)
[facility] SURGICAL CONSULTATION NOTE (initial) - cpt: 26948   HISTORY OF PRESENT ILLNESS (HPI):  28 y.o. male presented to [facility] ED on 08/17/2018 for evaluation of diabetic ketoacidosis. Patient reported that he believed he was in DKA because of the absences he had on his left hand and he was having abdominal pain which he only gets with DKA. He has a history of DKA and his presentation was similar to those times. He reports that he has had swelling and erythema to the dorsum of his left hand for about 4 days. He admits to using IV Drugs (heorin) in those areas within the last four days. He had an aspiration of those areas in the ED which revealed purulence. He was admitted to medicine for DKA management.   Today, he reports that one of the abscesses on his left hand had resolved however the abscess over his wrist had began to enlarge. He denied any drainage, fever, chills, or chest pain. He again endorsed using heroin intravenously in this area prior to presentation. He has a history of similar.   Surgery is consulted by hospitalist physician Dr. Shaune Pollack, MD in this context for evaluation and management of left wrist abscess.   PAST MEDICAL HISTORY (PMH):  Past Medical History:  Diagnosis Date  . Diabetes mellitus without complication (HCC)   . Heart attack (HCC)    Pt claims he had heart attack a year ago ( 2015)- and was admitted in [facility] for that, but not given any meds or angiogram, on review of chart- I could not find any details like that.  . Hepatitis C, acute may 2016  . Hypertension      PAST SURGICAL HISTORY (PSH):  Past Surgical History:  Procedure Laterality Date  . none       MEDICATIONS:  Prior to Admission medications   Medication Sig Start Date End Date Taking? Authorizing Provider  insulin aspart (NOVOLOG) 100 UNIT/ML injection Inject 5-10 Units into the skin See admin instructions. INJECT BEFORE MEALS PER SLIDING SCALE AS DIRECTED: 120-250 = 5  UNITS;251-350 = 8 UNITS; OVER 350 = 10 UNITS 05/20/18  Yes Doles-Johnson, Teah, NP  doxycycline (VIBRAMYCIN) 100 MG capsule Take 1 capsule (100 mg total) by mouth 2 (two) times daily. Patient not taking: Reported on 06/13/2018 04/21/18   Sharman Cheek, MD  insulin aspart protamine- aspart (NOVOLOG MIX 70/30) (70-30) 100 UNIT/ML injection INJECT 30 UNITS UNDER THE SKIN WITH BREAKFAST AND 28 UNITS WITH DINNER 08/20/18   Shaune Pollack, MD     ALLERGIES:  Allergies  Allergen Reactions  . Bee Venom Anaphylaxis  . Penicillins Anaphylaxis, Hives and Other (See Comments)    Has patient had a PCN reaction causing immediate rash, facial/tongue/throat swelling, SOB or lightheadedness with hypotension: Yes Has patient had a PCN reaction causing severe rash involving mucus membranes or skin necrosis: No Has patient had a PCN reaction that required hospitalization No Has patient had a PCN reaction occurring within the last 10 years: Yes If all of the above answers are "NO", then may proceed with Cephalosporin use.  . Ibuprofen Other (See Comments)    Other reaction(s): Other (See Comments) Stomach upset Reaction:  GI upset   . Tramadol Hives and Swelling  . Vancomycin Rash and Other (See Comments)    Reaction:  Red man's syndrome      SOCIAL HISTORY:  Social History   Socioeconomic History  . Marital status: Single    Spouse name: Not on file  .  Number of children: Not on file  . Years of education: Not on file  . Highest education level: Not on file  Occupational History  . Occupation: tree cutter  Social Needs  . Financial resource strain: Not on file  . Food insecurity:    Worry: Not on file    Inability: Not on file  . Transportation needs:    Medical: Not on file    Non-medical: Not on file  Tobacco Use  . Smoking status: Current Every Day Smoker    Packs/day: 2.00    Types: Cigarettes  . Smokeless tobacco: Never Used  Substance and Sexual Activity  . Alcohol use: No     Alcohol/week: 0.0 standard drinks  . 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 Sexual activity: Yes  Lifestyle  . Physical activity:    Days per week: Not on file    Minutes per session: Not on file  . Stress: Not on file  Relationships  . Social connections:    Talks on phone: Not on file    Gets together: Not on file    Attends religious service: Not on file    Active member of club or organization: Not on file    Attends meetings of clubs or organizations: Not on file    Relationship status: Not on file  . Intimate partner violence:    Fear of current or ex partner: Not on file    Emotionally abused: Not on file    Physically abused: Not on file    Forced sexual activity: Not on file  Other Topics Concern  . Not on file  Social History Narrative  . Not on file     FAMILY HISTORY:  Family History  Problem Relation Age of Onset  . Cirrhosis Mother   . Diabetes Mellitus II Maternal Grandmother   . Diabetes Mellitus II Maternal Grandfather       REVIEW OF SYSTEMS:  Review of Systems  Constitutional: Negative for chills and fever.  Respiratory: Negative for cough and shortness of breath.   Cardiovascular: Negative for chest pain and palpitations.  Gastrointestinal: Negative for abdominal pain, nausea and vomiting.  Skin:       + Abscess  All other systems reviewed and are negative.   VITAL SIGNS:  Temp:  [97.8 F (36.6 C)-98.6 F (37 C)] 97.8 F (36.6 C) (01/31 0440) Pulse Rate:  [63-74] 63 (01/31 0445) Resp:  [16-18] 18 (01/31 0440) BP: (128-137)/(82-91) 129/88 (01/31 0445) SpO2:  [98 %-99 %] 99 % (01/31 0440)     Height: 5\' 4"  (162.6 cm) Weight: 50.8 kg BMI (Calculated): 19.21   INTAKE/OUTPUT:  This shift: No intake/output data recorded.  Last 2 shifts: @IOLAST2SHIFTS @   PHYSICAL EXAM:  Physical Exam Constitutional:      General: He is not in acute distress.    Appearance: Normal appearance. He is normal weight. He  is not ill-appearing.  HENT:     Head: Normocephalic and atraumatic.  Eyes:     General: No scleral icterus.    Conjunctiva/sclera: Conjunctivae normal.  Cardiovascular:     Rate and Rhythm: Normal rate and regular rhythm.     Heart sounds: No murmur. No friction rub. No gallop.   Pulmonary:     Effort: Pulmonary effort is normal. No respiratory distress.     Breath sounds: Normal breath sounds. No wheezing or rhonchi.  Musculoskeletal:  General: No swelling.     Right lower leg: No edema.     Left lower leg: No edema.  Skin:    General: Skin is warm and dry.     Findings: Abscess and erythema present.       Neurological:     Mental Status: He is alert.  Psychiatric:        Attention and Perception: Attention normal.        Mood and Affect: Affect is flat.        Speech: He is noncommunicative (Minimal communication).       Labs:  CBC Latest Ref Rng & Units 08/18/2018 08/17/2018 06/13/2018  WBC 4.0 - 10.5 K/uL 11.2(H) 8.6 8.2  Hemoglobin 13.0 - 17.0 g/dL 12.9(L) 13.6 14.5  Hematocrit 39.0 - 52.0 % 40.2 42.8 44.8  Platelets 150 - 400 K/uL 334 326 268   CMP Latest Ref Rng & Units 08/21/2018 08/20/2018 08/20/2018  Glucose 70 - 99 mg/dL 409(WJ524(HH) 191(Y332(H) -  BUN 6 - 20 mg/dL 78(G21(H) 13 -  Creatinine 0.61 - 1.24 mg/dL 9.560.85 2.130.77 -  Sodium 086135 - 145 mmol/L 126(L) 132(L) -  Potassium 3.5 - 5.1 mmol/L 5.3(H) 4.3 4.3  Chloride 98 - 111 mmol/L 95(L) 101 -  CO2 22 - 32 mmol/L 23 24 -  Calcium 8.9 - 10.3 mg/dL 5.7(Q8.4(L) 4.6(N8.2(L) -  Total Protein 6.5 - 8.1 g/dL - - -  Total Bilirubin 0.3 - 1.2 mg/dL - - -  Alkaline Phos 38 - 126 U/L - - -  AST 15 - 41 U/L - - -  ALT 0 - 44 U/L - - -     Assessment/Plan: (ICD-10's: L02. 512) 28 y.o. male with an abscess to the dorsum of his left hand which is attributable to his history of IVDA, complicated by pertinent comorbidities including DM with history of DKA, HTN, Hepatitis C, and history of polysubstance abuse (smoking, heroin).   - Will  plan for bedside I&D and obtain cultures.   - All risks, benefits, and alternatives to above procedure were discussed with the patient, all of his questions were answered to his expressed satisfaction, patient expresses he wishes to proceed, and informed consent was obtained.   - Continue ABx  - Medical management of co-morbidities per primary team   All of the above findings and recommendations were discussed with the patient, and all of patient's questions were answered to his expressed satisfaction.  Thank you for the opportunity to participate in this patient's care.   -- Lynden OxfordZachary Caitrin Pendergraph, PA-C [facility] 08/21/2018, 11:12 AM [phone] M-F: 7am - 4pm

## 2018-08-21 NOTE — Progress Notes (Signed)
Lab called with critical glucose of 524; care nurse Krustin Joyner-Little, RN, notified of results; monitoring; acknowledged. Windy Carina, RN 5:37 AM1/31/2020

## 2018-08-21 NOTE — Progress Notes (Signed)
Notified Md of low blood glucose, protocol followed, discussed 70/30 insulin doses, continue to monitor

## 2018-08-21 NOTE — Progress Notes (Signed)
MD notified: Sodium level has been trending down most currently sodium level is 126 do you want to order something. Potassium level is 5.3 this morning.

## 2018-08-21 NOTE — Procedures (Addendum)
PROCEDURE NOTE  DATE OF PROCEDURE: 08/21/2018  AUTHORIZING PROVIDER: Dr. Satira Mccallum, MD  PERFORMING PROVIDER: Lynden Oxford, PA-C  ANESTHESIA: 7 ccs of 1% lidocaine  PRE-PROCEDURE DIAGNOSIS: Left hand abscess (icd-10's: L02.512)  POST-PROCEDURE DIAGNOSIS: Left hand abscess (icd-10's: L02.512)  PROCEDURE(S):  1.) Incision and drainage of simple left hand abscess abscess (cpt: 10060)  INTRAOPERATIVE FINDINGS: ~5 mL of foul-smelling greenish purulent fluid under significant pressure from left hand  ESTIMATED BLOOD LOSS: Minimal (<20 mL)   SPECIMENS: Contents of Left hand abscess for culture  IMPLANTS: None  DRAINS: None  COMPLICATIONS: None apparent  INDICATIONS FOR PROCEDURE:  Patient is a 28 y.o. male with left hand abscess secondary to IVDA.   DETAILS OF PROCEDURE: The area was prepped in typical sterile fashion. 7 ccs of 1% lidocaine was injected intradermally to achieve anesthetic. A 1 cm linear incision was made using 10 blade scalpel at the area of most fluctuance. About 5 cc of greenish purulence was expressed with direct pressure. The wound was explored and probed with hemostat which did not reveal any loculations. Hemostasis was achieved with direct pressure. The wound was then covered with 4x4 gauze and paper tape. The patient tolerated the procedure well without complication.   All sharps were accounted for and disposed probably.   They patient tolerated the procedure well without complication. Dr Satira Mccallum, MD was present throughout the procedure.   SIGNED:  Lynden Oxford, PA-C [facility] 08/21/2018, 2:13 PM [phone] M-F: 7am - 4pm

## 2018-08-21 NOTE — Care Management (Signed)
RNCM has obtained medication from discharge from Medication Management . Bactrim, Novolog 70/30, Novolog, and Cedfdinir.    Medication will be placed in locked patient bin in preparation for discharge.

## 2018-08-21 NOTE — Progress Notes (Signed)
Pharmacy Electrolyte Monitoring Consult:  Pharmacy consulted to assist in monitoring and replacing electrolytes in this 28 y.o. male admitted on 08/17/2018 with Hyperglycemia  Patient was started on DKA insulin drip protocol. Insulin drip has been discontinued and patient is transitioned to Novolog 70/30 regimen.   Labs:  Sodium (mmol/L)  Date Value  08/21/2018 126 (L)  12/31/2016 138  04/06/2014 137   Potassium (mmol/L)  Date Value  08/21/2018 5.3 (H)  04/06/2014 3.8   Magnesium (mg/dL)  Date Value  65/79/0383 2.1  12/12/2013 1.6 (L)   Phosphorus (mg/dL)  Date Value  33/83/2919 3.6  12/10/2013 2.6   Calcium (mg/dL)  Date Value  16/60/6004 8.4 (L)   Calcium, Total (mg/dL)  Date Value  59/97/7414 8.1 (L)   Albumin (g/dL)  Date Value  23/95/3202 2.8 (L)  12/31/2016 4.3  04/05/2014 3.4    Assessment/Plan: 01/31 K 5.3, slightly elevated Will hold replacement. Recheck with AM labs.  Abbie Sons, PharmD Clinical Pharmacist 08/21/2018 12:32 PM

## 2018-08-21 NOTE — Progress Notes (Addendum)
[employer] [facility] - [place] [facility]   PATIENT NAME: Trevor Brown    MR#:  [id number]  DATE OF BIRTH:  1991-02-11  SUBJECTIVE:  CHIEF COMPLAINT:   Chief Complaint  Patient presents with  . Hyperglycemia   The patient complains of generalized weakness.  Still hyperglycemia. REVIEW OF SYSTEMS:  Review of Systems  Constitutional: Positive for malaise/fatigue. Negative for chills and fever.  HENT: Negative for sore throat.   Eyes: Negative for blurred vision and double vision.  Respiratory: Negative for cough, hemoptysis, shortness of breath, wheezing and stridor.   Cardiovascular: Negative for chest pain, palpitations, orthopnea and leg swelling.  Gastrointestinal: Negative for abdominal pain, blood in stool, diarrhea, melena, nausea and vomiting.  Genitourinary: Negative for dysuria, flank pain and hematuria.  Musculoskeletal: Negative for back pain and joint pain.  Skin: Negative for rash.  Neurological: Negative for dizziness, sensory change, focal weakness, seizures, loss of consciousness, weakness and headaches.  Endo/Heme/Allergies: Negative for polydipsia.  Psychiatric/Behavioral: Negative for depression. The patient is not nervous/anxious.     DRUG ALLERGIES:   Allergies  Allergen Reactions  . Bee Venom Anaphylaxis  . Penicillins Anaphylaxis, Hives and Other (See Comments)    Has patient had a PCN reaction causing immediate rash, facial/tongue/throat swelling, SOB or lightheadedness with hypotension: Yes Has patient had a PCN reaction causing severe rash involving mucus membranes or skin necrosis: No Has patient had a PCN reaction that required hospitalization No Has patient had a PCN reaction occurring within the last 10 years: Yes If all of the above answers are "NO", then may proceed with Cephalosporin use.  . Ibuprofen Other (See Comments)    Other reaction(s): Other (See Comments) Stomach upset Reaction:  GI upset   . Tramadol Hives and  Swelling  . Vancomycin Rash and Other (See Comments)    Reaction:  Red man's syndrome    VITALS:  Blood pressure 129/88, pulse 63, temperature 97.8 F (36.6 C), temperature source Oral, resp. rate 18, height 5\' 4"  (1.626 m), weight 50.8 kg, SpO2 99 %. PHYSICAL EXAMINATION:  Physical Exam Constitutional:      General: He is not in acute distress. HENT:     Head: Normocephalic.     Mouth/Throat:     Mouth: Mucous membranes are moist.     Pharynx: No oropharyngeal exudate.  Eyes:     General: No scleral icterus.    Conjunctiva/sclera: Conjunctivae normal.     Pupils: Pupils are equal, round, and reactive to light.  Neck:     Musculoskeletal: Normal range of motion and neck supple.     Vascular: No JVD.     Trachea: No tracheal deviation.  Cardiovascular:     Rate and Rhythm: Normal rate and regular rhythm.     Heart sounds: Normal heart sounds. No murmur. No gallop.   Pulmonary:     Effort: Pulmonary effort is normal. No respiratory distress.     Breath sounds: Normal breath sounds. No wheezing or rales.  Abdominal:     General: Bowel sounds are normal. There is no distension.     Palpations: Abdomen is soft.     Tenderness: There is no abdominal tenderness. There is no rebound.  Musculoskeletal: Normal range of motion.        General: No tenderness.     Right lower leg: No edema.     Left lower leg: No edema.     Comments: Left arm in dressing. Abscess on eft forearm.  Skin:  Findings: No erythema or rash.  Neurological:     General: No focal deficit present.     Mental Status: He is alert and oriented to person, place, and time.     Cranial Nerves: No cranial nerve deficit.  Psychiatric:        Mood and Affect: Mood normal.    LABORATORY PANEL:  Male CBC Recent Labs  Lab 08/18/18 0613  WBC 11.2*  HGB 12.9*  HCT 40.2  PLT 334   ------------------------------------------------------------------------------------------------------------------ Chemistries    Recent Labs  Lab 08/19/18 0320  08/21/18 0440  NA 132*   < > 126*  K 3.6   < > 5.3*  CL 103   < > 95*  CO2 23   < > 23  GLUCOSE 293*   < > 524*  BUN 15   < > 21*  CREATININE 0.54*   < > 0.85  CALCIUM 7.9*   < > 8.4*  AST 123*  --   --   ALT 102*  --   --   ALKPHOS 163*  --   --   BILITOT 0.4  --   --    < > = values in this interval not displayed.   RADIOLOGY:  No results found. ASSESSMENT AND PLAN:   *DKA Due to noncompliance. He is off insulin IV drip and started sliding scale and novolog 70/30.  The patient developed DKA again with anion gap up to 20 and blood sugar was more than 500. The patient was transferred to stepdown unit start insulin drip with IV fluid support.  DKA improved.  Hyperglycemia due to diabetes, BS 524 this am. Increased doses of NovoLog 70/30 again.  Continue sliding scale with NovoLog AC.  *Pseudohyponatremia Due to yperglycemia, continue NS iv.  *Abscess on left arm ER physician did drainage. Cultures from the drainage: MODERATE SERRATIA MARCESCENS  MODERATE GROUP B STREP(S.AGALACTIAE)ISOLATED  MODERATE RAOULTELLA PLANTICOLA  As patient is IV drug user, negative blood cultures so far. Disontinued doxycycline. Changed to Cefdnir and Bactrim (abx total for 10 days). There is another abscess on left forearm.  Surgical consult for I&D.  *Elevated LFTs and bilirubin This could be explained by his hepatitis C, better.  *Tobacco abuse-active smoking Counseled against smoking for 4 minutes and offered nicotine patch.  *IV drug use No withdrawal symptoms.  Hyperkalemia.  Continue IV normal saline and follow-up BMP.  All the records are reviewed and case discussed with Care Management/Social Worker. Management plans discussed with the patient, family and they are in agreement.  CODE STATUS: Full Code  TOTAL CRITICAL TIME TAKING CARE OF THIS PATIENT: 28 minutes.   More than 50% of the time was spent in counseling/coordination of  care: YES  POSSIBLE D/C IN 2 DAYS, DEPENDING ON CLINICAL CONDITION.   Shaune Pollack M.D on 08/21/2018 at 12:48 PM  Between 7am to 6pm - Pager - [phone]  After 6pm go to [email or web address] - Therapist, nutritional Hospitalists

## 2018-08-22 LAB — CULTURE, BLOOD (ROUTINE X 2)
Culture: NO GROWTH
Culture: NO GROWTH
Special Requests: ADEQUATE
Specimen Description: ADEQUATE

## 2018-08-22 LAB — BASIC METABOLIC PANEL
Anion gap: 9 (ref 5–15)
BUN: 18 mg/dL (ref 6–20)
CO2: 21 mmol/L — ABNORMAL LOW (ref 22–32)
Calcium: 8.4 mg/dL — ABNORMAL LOW (ref 8.9–10.3)
Chloride: 106 mmol/L (ref 98–111)
Creatinine, Ser: 0.7 mg/dL (ref 0.61–1.24)
GFR calc Af Amer: >60 mL/min (ref >60)
GFR calc non Af Amer: >60 mL/min (ref >60)
Glucose, Bld: 69 mg/dL — ABNORMAL LOW (ref 70–99)
Potassium: 4.2 mmol/L (ref 3.5–5.1)
Sodium: 136 mmol/L (ref 135–145)

## 2018-08-22 LAB — GLUCOSE, CAPILLARY
Glucose-Capillary: 238 mg/dL — ABNORMAL HIGH (ref 70–99)
Glucose-Capillary: 89 mg/dL (ref 70–99)

## 2018-08-22 MED ORDER — INSULIN ASPART PROT & ASPART (70-30 MIX) 100 UNIT/ML ~~LOC~~ SUSP: 25.0000 [IU] | Freq: Every day | SUBCUTANEOUS | 2 refills | Status: DC

## 2018-08-22 MED ORDER — INSULIN ASPART PROT & ASPART (70-30 MIX) 100 UNIT/ML ~~LOC~~ SUSP: 30.0000 [IU] | Freq: Every day | SUBCUTANEOUS | Status: DC

## 2018-08-22 MED ORDER — INSULIN ASPART PROT & ASPART (70-30 MIX) 100 UNIT/ML ~~LOC~~ SUSP: 32.0000 [IU] | Freq: Every day | SUBCUTANEOUS | Status: DC

## 2018-08-22 MED ORDER — INSULIN ASPART 100 UNIT/ML ~~LOC~~ SOLN
5.0000 [IU] | Freq: Three times a day (TID) | SUBCUTANEOUS | Status: DC
  Administered 2018-08-22 (×2): 5 [IU] via SUBCUTANEOUS
  Filled 2018-08-22 (×2): qty 1

## 2018-08-22 MED ORDER — INSULIN ASPART PROT & ASPART (70-30 MIX) 100 UNIT/ML ~~LOC~~ SUSP: 30.0000 [IU] | Freq: Every day | SUBCUTANEOUS | 2 refills | Status: DC

## 2018-08-22 MED ORDER — INSULIN ASPART PROT & ASPART (70-30 MIX) 100 UNIT/ML ~~LOC~~ SUSP: 28.0000 [IU] | Freq: Every day | SUBCUTANEOUS | 2 refills | Status: DC

## 2018-08-22 MED ORDER — INSULIN ASPART PROT & ASPART (70-30 MIX) 100 UNIT/ML ~~LOC~~ SUSP
30.0000 [IU] | Freq: Every day | SUBCUTANEOUS | Status: DC
  Administered 2018-08-22: 30 [IU] via SUBCUTANEOUS
  Filled 2018-08-22: qty 10

## 2018-08-22 NOTE — Progress Notes (Signed)
Patient cleared for discharge.        Education complete. AVS printed. Discharge instructions given. All questions answered for patient clarification.  Prescriptions given. Provided by case management. IV removed.  Patient will leave once pain level is reassessed in 1 hr post pain meds.

## 2018-08-22 NOTE — Progress Notes (Signed)
Pharmacy Electrolyte Monitoring Consult:  Pharmacy consulted to assist in monitoring and replacing electrolytes in this 28 y.o. male admitted on 08/17/2018 with Hyperglycemia  Patient was started on DKA insulin drip protocol. Insulin drip has been discontinued and patient is transitioned to Novolog 70/30 regimen.   Labs:  Sodium (mmol/L)  Date Value  08/22/2018 136  12/31/2016 138  04/06/2014 137   Potassium (mmol/L)  Date Value  08/22/2018 4.2  04/06/2014 3.8   Magnesium (mg/dL)  Date Value  40/98/1191 2.1  12/12/2013 1.6 (L)   Phosphorus (mg/dL)  Date Value  47/82/9562 3.6  12/10/2013 2.6   Calcium (mg/dL)  Date Value  13/02/6577 8.4 (L)   Calcium, Total (mg/dL)  Date Value  46/96/2952 8.1 (L)   Albumin (g/dL)  Date Value  84/13/2440 2.8 (L)  12/31/2016 4.3  04/05/2014 3.4    Assessment/Plan: 2/1 K 4.2 - WNL, no supplementation needed at this time.   Will sign off at this time as consult originated in the ICU and pt out of ICU.  Crist Fat, PharmD, BCPS Clinical Pharmacist 08/22/2018 8:49 AM

## 2018-08-22 NOTE — Progress Notes (Signed)
SURGICAL PROGRESS NOTE  Hospital Day(s): 5.   Post op day(s):  Marland Kitchen   Interval History: Patient seen and examined, no acute events or new complaints overnight. Patient reports Left wrist pain much improved s/p incision and drainage of IV drug abuse-associated Left wrist abscess yesterday, denies fever/chills, CP, or SOB.  Review of Systems:  Constitutional: denies fever, chills  Respiratory: denies any shortness of breath  Cardiovascular: denies chest pain or palpitations  Musculoskeletal: denies pain, decreased motor or sensation Integumentary: denies any other rashes or skin discolorations except as per interval history  Vital signs in last 24 hours: [min-max] current  Temp:  [97.7 F (36.5 C)-99 F (37.2 C)] 97.7 F (36.5 C) (02/01 0502) Pulse Rate:  [69-91] 69 (02/01 0502) Resp:  [17-20] 17 (02/01 0502) BP: (107-132)/(62-83) 132/83 (02/01 0502) SpO2:  [98 %-99 %] 99 % (02/01 0502)     Height: 5\' 4"  (162.6 cm) Weight: 50.8 kg BMI (Calculated): 19.21   Intake/Output this shift:  No intake/output data recorded.   Intake/Output last 2 shifts:  @IOLAST2SHIFTS @   Physical Exam:  Constitutional: alert, cooperative and no distress  Respiratory: breathing non-labored at rest  Cardiovascular: regular rate and sinus rhythm  Integumentary: Left wrist wound soft, flat, and minimally tender to palpation without surrounding erythema or purulent drainage  Labs:  CBC Latest Ref Rng & Units 08/18/2018 08/17/2018 06/13/2018  WBC 4.0 - 10.5 K/uL 11.2(H) 8.6 8.2  Hemoglobin 13.0 - 17.0 g/dL 12.9(L) 13.6 14.5  Hematocrit 39.0 - 52.0 % 40.2 42.8 44.8  Platelets 150 - 400 K/uL 334 326 268   CMP Latest Ref Rng & Units 08/22/2018 08/21/2018 08/21/2018  Glucose 70 - 99 mg/dL 71(Q) 90 197(JO)  BUN 6 - 20 mg/dL 18 83(G) 54(D)  Creatinine 0.61 - 1.24 mg/dL 8.26 4.15 8.30  Sodium 135 - 145 mmol/L 136 135 126(L)  Potassium 3.5 - 5.1 mmol/L 4.2 4.1 5.3(H)  Chloride 98 - 111 mmol/L 106 101 95(L)  CO2  22 - 32 mmol/L 21(L) 24 23  Calcium 8.9 - 10.3 mg/dL 9.4(M) 8.9 7.6(K)  Total Protein 6.5 - 8.1 g/dL - - -  Total Bilirubin 0.3 - 1.2 mg/dL - - -  Alkaline Phos 38 - 126 U/L - - -  AST 15 - 41 U/L - - -  ALT 0 - 44 U/L - - -   Imaging studies: No new pertinent imaging studies  Assessment/Plan: 28 y.o. 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   - antibiotics as per primary team  - outpatient surgical follow-up in 1 week  - change dressing/packing once daily + prn  - no further surgical intervention indicated at this time  - medical management as per medical team  - cessation of IV drug abuse encouraged  - will signoff, please call if questions  All of the above findings and recommendations were discussed with the patient, and all of patient's questions were answered to his expressed satisfaction.  Thank you for the opportunity to participate in this patient's care.  -- Scherrie Gerlach Earlene Plater, MD, RPVI [place]: [facility] General Surgery - Partnering for exceptional care. Office: [phone]

## 2018-08-22 NOTE — Discharge Summary (Addendum)
[facility] -  at [facility]   PATIENT NAME: Trevor Brown    MR#:  [id number]  DATE OF BIRTH:  1991-07-11  DATE OF ADMISSION:  08/17/2018   ADMITTING PHYSICIAN: Altamese Dilling, MD  DATE OF DISCHARGE: 08/22/2018  PRIMARY CARE PHYSICIAN: Patient, No Pcp Per   ADMISSION DIAGNOSIS:  Diabetic ketoacidosis without coma associated with type 1 diabetes mellitus (HCC) [E10.10] DISCHARGE DIAGNOSIS:  Principal Problem:   DKA (diabetic ketoacidoses) (HCC) Active Problems:   Cellulitis  SECONDARY DIAGNOSIS:   Past Medical History:  Diagnosis Date  . Diabetes mellitus without complication (HCC)   . Heart attack (HCC)    Pt claims he had heart attack a year ago ( 2015)- and was admitted in [facility] for that, but not given any meds or angiogram, on review of chart- I could not find any details like that.  . Hepatitis C, acute may 2016  . Hypertension    HOSPITAL COURSE:  *DKA Due to noncompliance. He is off insulin IV drip and started sliding scale and novolog 70/30.  The patient developed DKA again with anion gap up to 20 and blood sugar was more than 500. The patient was transferred to stepdown unit start insulin drip with IV fluid support.  DKA improved.  Hyperglycemia due to diabetes. Improved with increased doses of NovoLog 70/30. Continue sliding scale with NovoLog AC.  The patient is given free glucometer and will be given free medications including NovoLog and antibiotics.  *Pseudohyponatremia Improved.  *Abscess on left arm ER physician did drainage. Cultures from the drainage: MODERATE SERRATIA MARCESCENS  MODERATE GROUP B STREP(S.AGALACTIAE)ISOLATED  MODERATE RAOULTELLA PLANTICOLA  As patient is IV drug user, negative blood cultures so far. Disontinued doxycycline. Changed to Cefdnir and Bactrim (abx total for 10 days). There is another abscess on left forearm.  S/p  I&D, follow-up Dr. Earlene Plater as outpatient.  *Elevated LFTs and  bilirubin This could be explained by his hepatitis C, better.  *Tobacco abuse-active smoking Counseled against smoking for 4 minutes and offered nicotine patch.  *IV drug use No withdrawal symptoms.  Hyperkalemia.    Improved with IV fluid. DISCHARGE CONDITIONS:  Stable, discharge to home today. CONSULTS OBTAINED:  Treatment Team:  Ancil Linsey, MD DRUG ALLERGIES:   Allergies  Allergen Reactions  . Bee Venom Anaphylaxis  . Penicillins Anaphylaxis, Hives and Other (See Comments)    Has patient had a PCN reaction causing immediate rash, facial/tongue/throat swelling, SOB or lightheadedness with hypotension: Yes Has patient had a PCN reaction causing severe rash involving mucus membranes or skin necrosis: No Has patient had a PCN reaction that required hospitalization No Has patient had a PCN reaction occurring within the last 10 years: Yes If all of the above answers are "NO", then may proceed with Cephalosporin use.  . Ibuprofen Other (See Comments)    Other reaction(s): Other (See Comments) Stomach upset Reaction:  GI upset   . Tramadol Hives and Swelling  . Vancomycin Rash and Other (See Comments)    Reaction:  Red man's syndrome    DISCHARGE MEDICATIONS:   Allergies as of 08/22/2018      Reactions   Bee Venom Anaphylaxis   Penicillins Anaphylaxis, Hives, Other (See Comments)   Has patient had a PCN reaction causing immediate rash, facial/tongue/throat swelling, SOB or lightheadedness with hypotension: Yes Has patient had a PCN reaction causing severe rash involving mucus membranes or skin necrosis: No Has patient had a PCN reaction that required hospitalization No Has patient  had a PCN reaction occurring within the last 10 years: Yes If all of the above answers are "NO", then may proceed with Cephalosporin use.   Ibuprofen Other (See Comments)   Other reaction(s): Other (See Comments) Stomach upset Reaction:  GI upset    Tramadol Hives, Swelling   Vancomycin  Rash, Other (See Comments)   Reaction:  Red man's syndrome       Medication List    STOP taking these medications   doxycycline 100 MG capsule Commonly known as:  VIBRAMYCIN     TAKE these medications   cefdinir 300 MG capsule Commonly known as:  OMNICEF Take 1 capsule (300 mg total) by mouth every 12 (twelve) hours.   insulin aspart 100 UNIT/ML injection Commonly known as:  NOVOLOG Inject 5-10 Units into the skin See admin instructions. INJECT BEFORE MEALS PER SLIDING SCALE AS DIRECTED: 120-250 = 5 UNITS;251-350 = 8 UNITS; OVER 350 = 10 UNITS   insulin aspart protamine- aspart (70-30) 100 UNIT/ML injection Commonly known as:  NOVOLOG MIX 70/30 Inject 0.25 mLs (25 Units total) into the skin daily with breakfast. What changed:    how much to take  how to take this  when to take this  additional instructions   insulin aspart protamine- aspart (70-30) 100 UNIT/ML injection Commonly known as:  NOVOLOG MIX 70/30 Inject 0.28 mLs (28 Units total) into the skin daily with supper. What changed:  You were already taking a medication with the same name, and this prescription was added. Make sure you understand how and when to take each.   sulfamethoxazole-trimethoprim 800-160 MG tablet Commonly known as:  BACTRIM DS,SEPTRA DS Take 2 tablets by mouth every 12 (twelve) hours.        DISCHARGE INSTRUCTIONS:  See AVS.  If you experience worsening of your admission symptoms, develop shortness of breath, life threatening emergency, suicidal or homicidal thoughts you must seek medical attention immediately by calling 911 or calling your MD immediately  if symptoms less severe.  You Must read complete instructions/literature along with all the possible adverse reactions/side effects for all the Medicines you take and that have been prescribed to you. Take any new Medicines after you have completely understood and accpet all the possible adverse reactions/side effects.   Please  note  You were cared for by a hospitalist during your hospital stay. If you have any questions about your discharge medications or the care you received while you were in the hospital after you are discharged, you can call the unit and asked to speak with the hospitalist on call if the hospitalist that took care of you is not available. Once you are discharged, your primary care physician will handle any further medical issues. Please note that NO REFILLS for any discharge medications will be authorized once you are discharged, as it is imperative that you return to your primary care physician (or establish a relationship with a primary care physician if you do not have one) for your aftercare needs so that they can reassess your need for medications and monitor your lab values.    On the day of Discharge:  VITAL SIGNS:  Blood pressure 132/83, pulse 69, temperature 97.7 F (36.5 C), temperature source Oral, resp. rate 17, height 5\' 4"  (1.626 m), weight 50.8 kg, SpO2 99 %. PHYSICAL EXAMINATION:  GENERAL:  28 y.o.-year-old patient lying in the bed with no acute distress.  EYES: Pupils equal, round, reactive to light and accommodation. No scleral icterus. Extraocular muscles intact.  HEENT: Head atraumatic, normocephalic. Oropharynx and nasopharynx clear.  NECK:  Supple, no jugular venous distention. No thyroid enlargement, no tenderness.  LUNGS: Normal breath sounds bilaterally, no wheezing, rales,rhonchi or crepitation. No use of accessory muscles of respiration.  CARDIOVASCULAR: S1, S2 normal. No murmurs, rubs, or gallops.  ABDOMEN: Soft, non-tender, non-distended. Bowel sounds present. No organomegaly or mass.  EXTREMITIES: No pedal edema, cyanosis, or clubbing.  NEUROLOGIC: Cranial nerves II through XII are intact. Muscle strength 5/5 in all extremities. Sensation intact. Gait not checked.  PSYCHIATRIC: The patient is alert and oriented x 3.  SKIN: No obvious rash, lesion, or ulcer.  DATA  REVIEW:   CBC Recent Labs  Lab 08/18/18 0613  WBC 11.2*  HGB 12.9*  HCT 40.2  PLT 334    Chemistries  Recent Labs  Lab 08/19/18 0320  08/22/18 0527  NA 132*   < > 136  K 3.6   < > 4.2  CL 103   < > 106  CO2 23   < > 21*  GLUCOSE 293*   < > 69*  BUN 15   < > 18  CREATININE 0.54*   < > 0.70  CALCIUM 7.9*   < > 8.4*  AST 123*  --   --   ALT 102*  --   --   ALKPHOS 163*  --   --   BILITOT 0.4  --   --    < > = values in this interval not displayed.     Microbiology Results  Results for orders placed or performed during the hospital encounter of 08/17/18  Wound or Superficial Culture     Status: None   Collection Time: 08/17/18 11:32 AM  Result Value Ref Range Status   Specimen Description   Final    ABSCESS Performed at [facility], [address] [place], [place] [address]    Special Requests   Final    NONE Performed at [facility], [address] [place], [place] [address]    Gram Stain   Final    FEW WBC PRESENT, PREDOMINANTLY PMN MODERATE GRAM POSITIVE COCCI FEW GRAM NEGATIVE RODS Performed at [facility], [address] [place], [place] [address]    Culture   Final    MODERATE SERRATIA MARCESCENS MODERATE GROUP B STREP(S.AGALACTIAE)ISOLATED MODERATE RAOULTELLA PLANTICOLA    Report Status 08/20/2018 FINAL  Final   Organism ID, Bacteria RAOULTELLA PLANTICOLA  Final   Organism ID, Bacteria SERRATIA MARCESCENS  Final      Susceptibility   Serratia marcescens - MIC*    CEFAZOLIN >=64 RESISTANT Resistant     CEFEPIME <=1 SENSITIVE Sensitive     CEFTAZIDIME <=1 SENSITIVE Sensitive     CEFTRIAXONE <=1 SENSITIVE Sensitive     CIPROFLOXACIN <=0.25 SENSITIVE Sensitive     GENTAMICIN <=1 SENSITIVE Sensitive     TRIMETH/SULFA <=20 SENSITIVE Sensitive     * MODERATE SERRATIA MARCESCENS   Raoultella planticola - MIC*    AMPICILLIN RESISTANT Resistant     CEFAZOLIN <=4 SENSITIVE Sensitive     CEFEPIME <=1 SENSITIVE Sensitive      CEFTAZIDIME <=1 SENSITIVE Sensitive     CEFTRIAXONE <=1 SENSITIVE Sensitive     CIPROFLOXACIN <=0.25 SENSITIVE Sensitive     GENTAMICIN <=1 SENSITIVE Sensitive     IMIPENEM <=0.25 SENSITIVE Sensitive     TRIMETH/SULFA <=20 SENSITIVE Sensitive     AMPICILLIN/SULBACTAM <=2 SENSITIVE Sensitive     PIP/TAZO <=4 SENSITIVE Sensitive     *  MODERATE RAOULTELLA PLANTICOLA  CULTURE, BLOOD (ROUTINE X 2) w Reflex to ID Panel     Status: None   Collection Time: 08/17/18  3:50 PM  Result Value Ref Range Status   Specimen Description BLOOD BLOOD LEFT FOREARM  Final   Special Requests   Final    BOTTLES DRAWN AEROBIC AND ANAEROBIC Blood Culture results may not be optimal due to an inadequate volume of blood received in culture bottles   Culture   Final    NO GROWTH 5 DAYS Performed at [facility], [address] [place], [place] [address]    Report Status 08/22/2018 FINAL  Final  CULTURE, BLOOD (ROUTINE X 2) w Reflex to ID Panel     Status: None   Collection Time: 08/17/18  3:57 PM  Result Value Ref Range Status   Specimen Description BLOOD Blood Culture adequate volume  Final   Special Requests   Final    BOTTLES DRAWN AEROBIC AND ANAEROBIC Blood Culture adequate volume   Culture   Final    NO GROWTH 5 DAYS Performed at [facility], [address] [place], [place] [address]    Report Status 08/22/2018 FINAL  Final  MRSA PCR Screening     Status: None   Collection Time: 08/17/18  4:41 PM  Result Value Ref Range Status   MRSA by PCR NEGATIVE NEGATIVE Final    Comment:        The GeneXpert MRSA Assay (FDA approved for NASAL specimens only), is one component of a comprehensive MRSA colonization surveillance program. It is not intended to diagnose MRSA infection nor to guide or monitor treatment for MRSA infections. Performed at [facility], [address] [place], [place] [address]     RADIOLOGY:  No results found.   Management plans discussed  with the patient, family and they are in agreement.  CODE STATUS: Full Code   TOTAL TIME TAKING CARE OF THIS PATIENT: 35 minutes.    Shaune PollackQing Quinton Voth M.D on 08/22/2018 at 11:36 AM  Between 7am to 6pm - Pager - [phone]  After 6pm go to [email or web address] - Social research officer, governmentpassword [email or web address]  [facility] [place] [facility]  Office  [phone]  CC: Primary care physician; Patient, No Pcp Per   Note: This dictation was prepared with Dragon dictation along with smaller phrase technology. Any transcriptional errors that result from this process are unintentional.

## 2018-08-27 LAB — AEROBIC/ANAEROBIC CULTURE W GRAM STAIN (SURGICAL/DEEP WOUND): Culture: NO GROWTH

## 2018-09-29 ENCOUNTER — Telehealth: Payer: Self-pay | Admitting: Pharmacy Technician

## 2018-09-29 NOTE — Telephone Encounter (Signed)
Received 2020 proof of income.  Patient eligible to receive medication assistance at [facility] as long as eligibility requirements continue to be met.  [facility]

## 2018-09-29 NOTE — Telephone Encounter (Signed)
Patient updated phone number sending note to [facility] to contact patient about appointment.  Sherilyn Dacosta Care Manager [facility]

## 2018-09-30 ENCOUNTER — Telehealth: Payer: Self-pay

## 2018-09-30 NOTE — Telephone Encounter (Signed)
Left voicemail for patient to call [facility] to schedule an appointment.

## 2018-10-07 ENCOUNTER — Other Ambulatory Visit: Payer: Self-pay

## 2018-10-07 ENCOUNTER — Encounter: Payer: Self-pay | Admitting: Emergency Medicine

## 2018-10-07 ENCOUNTER — Emergency Department: Payer: Medicaid Other

## 2018-10-07 ENCOUNTER — Emergency Department
Admission: EM | Admit: 2018-10-07 | Discharge: 2018-10-07 | Disposition: A | Discharge status: Home / Self Care | Payer: Medicaid Other | Attending: Emergency Medicine | Admitting: Emergency Medicine

## 2018-10-07 DIAGNOSIS — Z794 Long term (current) use of insulin: Secondary | ICD-10-CM | POA: Insufficient documentation

## 2018-10-07 DIAGNOSIS — F1920 Other psychoactive substance dependence, uncomplicated: Secondary | ICD-10-CM | POA: Insufficient documentation

## 2018-10-07 DIAGNOSIS — E119 Type 2 diabetes mellitus without complications: Secondary | ICD-10-CM | POA: Insufficient documentation

## 2018-10-07 DIAGNOSIS — R197 Diarrhea, unspecified: Secondary | ICD-10-CM | POA: Insufficient documentation

## 2018-10-07 DIAGNOSIS — M791 Myalgia, unspecified site: Secondary | ICD-10-CM | POA: Insufficient documentation

## 2018-10-07 DIAGNOSIS — I10 Essential (primary) hypertension: Secondary | ICD-10-CM | POA: Insufficient documentation

## 2018-10-07 DIAGNOSIS — F17210 Nicotine dependence, cigarettes, uncomplicated: Secondary | ICD-10-CM | POA: Insufficient documentation

## 2018-10-07 DIAGNOSIS — R112 Nausea with vomiting, unspecified: Secondary | ICD-10-CM | POA: Insufficient documentation

## 2018-10-07 LAB — CBC
HCT: 36.3 % — ABNORMAL LOW (ref 39.0–52.0)
Hemoglobin: 12.3 g/dL — ABNORMAL LOW (ref 13.0–17.0)
MCH: 29.5 pg (ref 26.0–34.0)
MCHC: 33.9 g/dL (ref 30.0–36.0)
MCV: 87.1 fL (ref 80.0–100.0)
Platelets: 314 10*3/uL (ref 150–400)
RBC: 4.17 MIL/uL — ABNORMAL LOW (ref 4.22–5.81)
RDW: 13.3 % (ref 11.5–15.5)
WBC: 9.8 10*3/uL (ref 4.0–10.5)
nRBC: 0 % (ref 0.0–0.2)

## 2018-10-07 LAB — BASIC METABOLIC PANEL
Anion gap: 12 (ref 5–15)
BUN: 17 mg/dL (ref 6–20)
CO2: 24 mmol/L (ref 22–32)
Calcium: 9.3 mg/dL (ref 8.9–10.3)
Chloride: 102 mmol/L (ref 98–111)
Creatinine, Ser: 0.77 mg/dL (ref 0.61–1.24)
GFR calc Af Amer: >60 mL/min (ref >60)
GFR calc non Af Amer: >60 mL/min (ref >60)
Glucose, Bld: 88 mg/dL (ref 70–99)
Potassium: 3.6 mmol/L (ref 3.5–5.1)
Sodium: 138 mmol/L (ref 135–145)

## 2018-10-07 LAB — TROPONIN I: Troponin I: <0.03 ng/mL (ref <0.03)

## 2018-10-07 LAB — GLUCOSE, CAPILLARY: Glucose-Capillary: 88 mg/dL (ref 70–99)

## 2018-10-07 MED ORDER — ACETAMINOPHEN 500 MG PO TABS
1000.0000 mg | ORAL_TABLET | Freq: Once | ORAL | Status: AC
  Administered 2018-10-07: 1000 mg via ORAL
  Filled 2018-10-07: qty 2

## 2018-10-07 MED ORDER — ONDANSETRON HCL 4 MG PO TABS: 4.0000 mg | ORAL_TABLET | Freq: Three times a day (TID) | ORAL | 0 refills | Status: DC | PRN

## 2018-10-07 MED ORDER — SODIUM CHLORIDE 0.9% FLUSH: 3.0000 mL | Freq: Once | INTRAVENOUS | Status: DC

## 2018-10-07 MED ORDER — SODIUM CHLORIDE 0.9 % IV BOLUS
1000.0000 mL | Freq: Once | INTRAVENOUS | Status: AC
  Administered 2018-10-07: 1000 mL via INTRAVENOUS

## 2018-10-07 NOTE — ED Provider Notes (Signed)
[facility] Emergency Department Provider Note   ____________________________________________   I have reviewed the triage vital signs and the nursing notes.   HISTORY  Chief Complaint Blood Sugar Problem   History limited by: Not Limited   HPI Trevor Brown is a 28 y.o. male who presents to the emergency department today because of concern for having trouble regulating his sugars. He states that it started yesterday. It has gone both high and low. This has been accompanied by full body aches, worse in his legs and back. He states that he typically feels this way when he has DKA. Did have subjective fever. denies any known sick contacts. Denies any shortness of breath or cough.   Records reviewed. Per medical record review patient has a history of DM, hepatitis C.   Past Medical History:  Diagnosis Date  . Diabetes mellitus without complication (HCC)   . Heart attack (HCC)    Pt claims he had heart attack a year ago ( 2015)- and was admitted in [facility] for that, but not given any meds or angiogram, on review of chart- I could not find any details like that.  . Hepatitis C, acute may 2016  . Hypertension     Patient Active Problem List   Diagnosis Date Noted  . Cellulitis 08/17/2018  . Abdominal pain 06/29/2017  . Hyponatremia 06/29/2017  . DKA (diabetic ketoacidosis) (HCC) 06/29/2017  . Dental erosion extending into pulp 12/31/2016  . 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 Elevated transaminase level 03/27/2016  . Diabetes mellitus type 1 (HCC) 03/27/2016  . Tobacco abuse counseling 03/27/2016  . Transaminitis 03/25/2016  . Tobacco abuse 01/10/2016  . Type 1 diabetes mellitus with hyperglycemia (HCC) 12/12/2015  . Cocaine abuse (HCC) 12/12/2015  . DKA (diabetic ketoacidoses) (HCC) 06/10/2015  . Hidradenitis suppurativa of left axilla   . DKA, type 1 (HCC) 06/06/2015  . Malnutrition of moderate degree (HCC) 04/08/2015  . Hepatitis C 12/13/2014  . Diabetes  type 1, uncontrolled (HCC) 12/11/2014  . Major depressive disorder, single episode, mild (HCC)   . Major depression, single episode 12/10/2014  . Diabetes mellitus type 1, uncontrolled (HCC) 12/09/2014  . Homelessness 12/09/2014    Past Surgical History:  Procedure Laterality Date  . none      Prior to Admission medications   Medication Sig Start Date End Date Taking? Authorizing Provider  cefdinir (OMNICEF) 300 MG capsule Take 1 capsule (300 mg total) by mouth every 12 (twelve) hours. 08/21/18   Shaune Pollack, MD  insulin aspart (NOVOLOG) 100 UNIT/ML injection Inject 5-10 Units into the skin See admin instructions. INJECT BEFORE MEALS PER SLIDING SCALE AS DIRECTED: 120-250 = 5 UNITS;251-350 = 8 UNITS; OVER 350 = 10 UNITS 08/21/18   Shaune Pollack, MD  insulin aspart protamine- aspart (NOVOLOG MIX 70/30) (70-30) 100 UNIT/ML injection Inject 0.25 mLs (25 Units total) into the skin daily with breakfast. 08/22/18   Shaune Pollack, MD  insulin aspart protamine- aspart (NOVOLOG MIX 70/30) (70-30) 100 UNIT/ML injection Inject 0.28 mLs (28 Units total) into the skin daily with supper. 08/22/18   Shaune Pollack, MD  sulfamethoxazole-trimethoprim (BACTRIM DS,SEPTRA DS) 800-160 MG tablet Take 2 tablets by mouth every 12 (twelve) hours. 08/21/18   Shaune Pollack, MD    Allergies Bee venom; Penicillins; Ibuprofen; Tramadol; and Vancomycin  Family History  Problem Relation Age of Onset  . Cirrhosis Mother   . Diabetes Mellitus II Maternal Grandmother   . Diabetes Mellitus II Maternal Grandfather  Social History Social History   Tobacco Use  . Smoking status: Current Every Day Smoker    Packs/day: 2.00    Types: Cigarettes  . Smokeless tobacco: Never Used  Substance Use Topics  . Alcohol use: No    Alcohol/week: 0.0 standard drinks  . Drug use: Yes    Types: IV    Comment: heroin- last used October 04 2018    Review of Systems Constitutional: Positive for subjective fever.  Eyes: No visual changes. ENT:  No sore throat. Cardiovascular: Denies chest pain. Respiratory: Denies shortness of breath. Gastrointestinal: No abdominal pain.  No nausea, no vomiting.  No diarrhea.   Genitourinary: Negative for dysuria. Musculoskeletal: Positive for body aches.  Skin: Negative for rash. Neurological: Negative for headaches, focal weakness or numbness.  ____________________________________________   PHYSICAL EXAM:  VITAL SIGNS: ED Triage Vitals [10/07/18 1759]  Enc Vitals Group     BP      Pulse      Resp      Temp      Temp src      SpO2      Weight 120 lb (54.4 kg)     Height 5\' 4"  (1.626 m)     Head Circumference      Peak Flow      Pain Score 10   Constitutional: Alert and oriented.  Eyes: Conjunctivae are normal.  ENT      Head: Normocephalic and atraumatic.      Nose: No congestion/rhinnorhea.      Mouth/Throat: Mucous membranes are moist.      Neck: No stridor. Hematological/Lymphatic/Immunilogical: No cervical lymphadenopathy. Cardiovascular: Normal rate, regular rhythm.  No murmurs, rubs, or gallops.  Respiratory: Normal respiratory effort without tachypnea nor retractions. Breath sounds are clear and equal bilaterally. No wheezes/rales/rhonchi. Gastrointestinal: Soft and non tender. No rebound. No guarding.  Genitourinary: Deferred Musculoskeletal: Normal range of motion in all extremities. No lower extremity edema. Neurologic:  Normal speech and language. No gross focal neurologic deficits are appreciated.  Skin:  Skin is warm, dry and intact. No rash noted. Psychiatric: Mood and affect are normal. Speech and behavior are normal. Patient exhibits appropriate insight and judgment.  ____________________________________________    LABS (pertinent positives/negatives)  BMP wnl Trop <0.03 CBC wbc 9.8, hgb 12.3, plt 314  ____________________________________________   EKG  I, Phineas Semen, attending physician, personally viewed and interpreted this EKG  EKG  Time: 1803 Rate: 121 Rhythm: sinus tachycardia Axis: rightward axis Intervals: qtc 428 QRS: narrow, q waves v1 ST changes: no st elevation Impression: abnormal ekg ____________________________________________    RADIOLOGY  CXR No active disease  ____________________________________________   PROCEDURES  Procedures  ____________________________________________   INITIAL IMPRESSION / ASSESSMENT AND PLAN / ED COURSE  Pertinent labs & imaging results that were available during my care of the patient were reviewed by me and considered in my medical decision making (see chart for details).   Patient presented to the emergency department because of concern for nausea, vomiting, body aches, and problem controlling his sugar. Patient has history of diabetes and frequent admissions for DKA. Work up today however without elevated sugar. No anion gap. At this point I doubt DKA. Do think patient was slightly dehydrated given nausea and vomiting and mild tachycardia. Patient was given IV fluids. Discussed with patient importance of following up with primary care physician.   ____________________________________________   FINAL CLINICAL IMPRESSION(S) / ED DIAGNOSES  Final diagnoses:  Nausea vomiting and diarrhea  Note: This dictation was prepared with Dragon dictation. Any transcriptional errors that result from this process are unintentional     Phineas Semen, MD 10/07/18 2146

## 2018-10-07 NOTE — Discharge Instructions (Addendum)
Please seek medical attention for any high fevers, chest pain, shortness of breath, change in behavior, persistent vomiting, bloody stool or any other new or concerning symptoms.  

## 2018-10-07 NOTE — ED Notes (Signed)
Patient transported to X-ray 

## 2018-10-07 NOTE — ED Triage Notes (Signed)
Pt to ED c/o trouble regulating blood sugar last couple days.  Has had nausea, vomiting, diarrhea, trouble holding down food or liquids.  Also states chest pain and bilateral leg swelling.  Last took 25 units sliding scale insulin at 1600 today, last ate or drank this morning.  Pt presents A&Ox4, chest rise even and unlabored, in NAD at this time, and is also homeless.

## 2018-10-11 ENCOUNTER — Encounter: Payer: Self-pay | Admitting: Emergency Medicine

## 2018-10-11 ENCOUNTER — Other Ambulatory Visit: Payer: Self-pay

## 2018-10-11 ENCOUNTER — Inpatient Hospital Stay
Admission: EM | Admit: 2018-10-11 | Discharge: 2018-10-15 | DRG: 603 | Disposition: A | Discharge status: Home / Self Care | Payer: Self-pay | Attending: Specialist | Admitting: Specialist

## 2018-10-11 DIAGNOSIS — I82409 Acute embolism and thrombosis of unspecified deep veins of unspecified lower extremity: Secondary | ICD-10-CM

## 2018-10-11 DIAGNOSIS — E1065 Type 1 diabetes mellitus with hyperglycemia: Secondary | ICD-10-CM | POA: Diagnosis present

## 2018-10-11 DIAGNOSIS — L02519 Cutaneous abscess of unspecified hand: Secondary | ICD-10-CM | POA: Diagnosis present

## 2018-10-11 DIAGNOSIS — Z881 Allergy status to other antibiotic agents status: Secondary | ICD-10-CM

## 2018-10-11 DIAGNOSIS — Z833 Family history of diabetes mellitus: Secondary | ICD-10-CM

## 2018-10-11 DIAGNOSIS — Z794 Long term (current) use of insulin: Secondary | ICD-10-CM

## 2018-10-11 DIAGNOSIS — L03119 Cellulitis of unspecified part of limb: Secondary | ICD-10-CM

## 2018-10-11 DIAGNOSIS — Z885 Allergy status to narcotic agent status: Secondary | ICD-10-CM

## 2018-10-11 DIAGNOSIS — R609 Edema, unspecified: Secondary | ICD-10-CM

## 2018-10-11 DIAGNOSIS — F1990 Other psychoactive substance use, unspecified, uncomplicated: Secondary | ICD-10-CM | POA: Diagnosis present

## 2018-10-11 DIAGNOSIS — F17210 Nicotine dependence, cigarettes, uncomplicated: Secondary | ICD-10-CM | POA: Diagnosis present

## 2018-10-11 DIAGNOSIS — Z888 Allergy status to other drugs, medicaments and biological substances status: Secondary | ICD-10-CM

## 2018-10-11 DIAGNOSIS — L02512 Cutaneous abscess of left hand: Principal | ICD-10-CM | POA: Diagnosis present

## 2018-10-11 DIAGNOSIS — I252 Old myocardial infarction: Secondary | ICD-10-CM

## 2018-10-11 DIAGNOSIS — E875 Hyperkalemia: Secondary | ICD-10-CM | POA: Diagnosis present

## 2018-10-11 DIAGNOSIS — R739 Hyperglycemia, unspecified: Secondary | ICD-10-CM

## 2018-10-11 DIAGNOSIS — I10 Essential (primary) hypertension: Secondary | ICD-10-CM | POA: Diagnosis present

## 2018-10-11 DIAGNOSIS — Z79899 Other long term (current) drug therapy: Secondary | ICD-10-CM

## 2018-10-11 DIAGNOSIS — Z886 Allergy status to analgesic agent status: Secondary | ICD-10-CM

## 2018-10-11 DIAGNOSIS — M7989 Other specified soft tissue disorders: Secondary | ICD-10-CM | POA: Diagnosis present

## 2018-10-11 DIAGNOSIS — B1920 Unspecified viral hepatitis C without hepatic coma: Secondary | ICD-10-CM | POA: Diagnosis present

## 2018-10-11 DIAGNOSIS — Z9119 Patient's noncompliance with other medical treatment and regimen: Secondary | ICD-10-CM

## 2018-10-11 DIAGNOSIS — Z91030 Bee allergy status: Secondary | ICD-10-CM

## 2018-10-11 DIAGNOSIS — L0291 Cutaneous abscess, unspecified: Secondary | ICD-10-CM

## 2018-10-11 DIAGNOSIS — L02413 Cutaneous abscess of right upper limb: Secondary | ICD-10-CM | POA: Diagnosis present

## 2018-10-11 DIAGNOSIS — Z880 Allergy status to penicillin: Secondary | ICD-10-CM

## 2018-10-11 LAB — BLOOD GAS, VENOUS
Acid-Base Excess: 1.7 mmol/L (ref 0.0–2.0)
Bicarbonate: 27.2 mmol/L (ref 20.0–28.0)
O2 Saturation: 71.5 %
Patient temperature: 37
pCO2, Ven: 45 mmHg (ref 44.0–60.0)
pH, Ven: 7.39 (ref 7.250–7.430)
pO2, Ven: 38 mmHg (ref 32.0–45.0)

## 2018-10-11 LAB — BASIC METABOLIC PANEL
Anion gap: 14 (ref 5–15)
BUN: 20 mg/dL (ref 6–20)
CO2: 24 mmol/L (ref 22–32)
Calcium: 8.5 mg/dL — ABNORMAL LOW (ref 8.9–10.3)
Chloride: 88 mmol/L — ABNORMAL LOW (ref 98–111)
Creatinine, Ser: 0.91 mg/dL (ref 0.61–1.24)
GFR calc Af Amer: >60 mL/min (ref >60)
GFR calc non Af Amer: >60 mL/min (ref >60)
Glucose, Bld: 753 mg/dL (ref 70–99)
Potassium: 5.8 mmol/L — ABNORMAL HIGH (ref 3.5–5.1)
Sodium: 126 mmol/L — ABNORMAL LOW (ref 135–145)

## 2018-10-11 LAB — CBC WITH DIFFERENTIAL/PLATELET
Abs Immature Granulocytes: 0.07 10*3/uL (ref 0.00–0.07)
Basophils Absolute: 0.1 10*3/uL (ref 0.0–0.1)
Basophils Relative: 1 %
Eosinophils Absolute: 0.1 10*3/uL (ref 0.0–0.5)
Eosinophils Relative: 1 %
HCT: 37.2 % — ABNORMAL LOW (ref 39.0–52.0)
Hemoglobin: 12 g/dL — ABNORMAL LOW (ref 13.0–17.0)
Immature Granulocytes: 1 %
Lymphocytes Relative: 13 %
Lymphs Abs: 1.7 10*3/uL (ref 0.7–4.0)
MCH: 29.5 pg (ref 26.0–34.0)
MCHC: 32.3 g/dL (ref 30.0–36.0)
MCV: 91.4 fL (ref 80.0–100.0)
Monocytes Absolute: 0.9 10*3/uL (ref 0.1–1.0)
Monocytes Relative: 7 %
Neutro Abs: 10.1 10*3/uL — ABNORMAL HIGH (ref 1.7–7.7)
Neutrophils Relative %: 77 %
Platelets: 254 10*3/uL (ref 150–400)
RBC: 4.07 MIL/uL — ABNORMAL LOW (ref 4.22–5.81)
RDW: 13.2 % (ref 11.5–15.5)
WBC: 13 10*3/uL — ABNORMAL HIGH (ref 4.0–10.5)
nRBC: 0 % (ref 0.0–0.2)

## 2018-10-11 LAB — GLUCOSE, CAPILLARY
Glucose-Capillary: >600 mg/dL (ref 70–99)
Glucose-Capillary: 319 mg/dL — ABNORMAL HIGH (ref 70–99)
Glucose-Capillary: 355 mg/dL — ABNORMAL HIGH (ref 70–99)

## 2018-10-11 LAB — URINALYSIS, COMPLETE (UACMP) WITH MICROSCOPIC
Bacteria, UA: NONE SEEN
Bilirubin Urine: NEGATIVE
Glucose, UA: >=500 mg/dL — AB
Hgb urine dipstick: NEGATIVE
Ketones, ur: 20 mg/dL — AB
Leukocytes,Ua: NEGATIVE
Nitrite: NEGATIVE
Protein, ur: NEGATIVE mg/dL
Specific Gravity, Urine: 1.021 (ref 1.005–1.030)
Squamous Epithelial / HPF: NONE SEEN (ref 0–5)
pH: 7 (ref 5.0–8.0)

## 2018-10-11 MED ORDER — INSULIN ASPART PROT & ASPART (70-30 MIX) 100 UNIT/ML ~~LOC~~ SUSP
28.0000 [IU] | Freq: Every day | SUBCUTANEOUS | Status: DC
  Administered 2018-10-13: 28 [IU] via SUBCUTANEOUS
  Filled 2018-10-11: qty 10

## 2018-10-11 MED ORDER — LIDOCAINE HCL (PF) 1 % IJ SOLN
5.0000 mL | Freq: Once | INTRAMUSCULAR | Status: AC
  Administered 2018-10-11: 5 mL
  Filled 2018-10-11: qty 5

## 2018-10-11 MED ORDER — GABAPENTIN 300 MG PO CAPS
300.0000 mg | ORAL_CAPSULE | Freq: Once | ORAL | Status: AC
  Administered 2018-10-11: 300 mg via ORAL
  Filled 2018-10-11: qty 1

## 2018-10-11 MED ORDER — ENOXAPARIN SODIUM 40 MG/0.4ML ~~LOC~~ SOLN
40.0000 mg | SUBCUTANEOUS | Status: DC
  Filled 2018-10-11 (×2): qty 0.4

## 2018-10-11 MED ORDER — CLINDAMYCIN PHOSPHATE 600 MG/50ML IV SOLN
600.0000 mg | Freq: Once | INTRAVENOUS | Status: AC
  Administered 2018-10-11: 600 mg via INTRAVENOUS
  Filled 2018-10-11: qty 50

## 2018-10-11 MED ORDER — KETOROLAC TROMETHAMINE 30 MG/ML IJ SOLN
15.0000 mg | Freq: Four times a day (QID) | INTRAMUSCULAR | Status: DC | PRN
  Administered 2018-10-11 – 2018-10-13 (×5): 15 mg via INTRAVENOUS
  Filled 2018-10-11 (×5): qty 1

## 2018-10-11 MED ORDER — SODIUM CHLORIDE 0.9 % IV SOLN
INTRAVENOUS | Status: AC
  Administered 2018-10-11: 23:00:00 via INTRAVENOUS

## 2018-10-11 MED ORDER — ACETAMINOPHEN 650 MG RE SUPP: 650.0000 mg | Freq: Four times a day (QID) | RECTAL | Status: DC | PRN

## 2018-10-11 MED ORDER — ACETAMINOPHEN 325 MG PO TABS: 650.0000 mg | ORAL_TABLET | Freq: Four times a day (QID) | ORAL | Status: DC | PRN

## 2018-10-11 MED ORDER — ACETAMINOPHEN 500 MG PO TABS
1000.0000 mg | ORAL_TABLET | Freq: Once | ORAL | Status: AC
  Administered 2018-10-11: 1000 mg via ORAL
  Filled 2018-10-11: qty 2

## 2018-10-11 MED ORDER — INSULIN ASPART PROT & ASPART (70-30 MIX) 100 UNIT/ML ~~LOC~~ SUSP
25.0000 [IU] | Freq: Every day | SUBCUTANEOUS | Status: DC
  Administered 2018-10-12 – 2018-10-15 (×4): 25 [IU] via SUBCUTANEOUS
  Filled 2018-10-11 (×4): qty 10

## 2018-10-11 MED ORDER — SODIUM CHLORIDE 0.9 % IV BOLUS
1000.0000 mL | Freq: Once | INTRAVENOUS | Status: AC
  Administered 2018-10-11: 1000 mL via INTRAVENOUS

## 2018-10-11 MED ORDER — INSULIN ASPART 100 UNIT/ML ~~LOC~~ SOLN
20.0000 [IU] | Freq: Once | SUBCUTANEOUS | Status: AC
  Administered 2018-10-11: 20 [IU] via INTRAVENOUS
  Filled 2018-10-11: qty 1

## 2018-10-11 MED ORDER — ONDANSETRON HCL 4 MG/2ML IJ SOLN: 4.0000 mg | Freq: Four times a day (QID) | INTRAMUSCULAR | Status: DC | PRN

## 2018-10-11 MED ORDER — ONDANSETRON HCL 4 MG PO TABS: 4.0000 mg | ORAL_TABLET | Freq: Four times a day (QID) | ORAL | Status: DC | PRN

## 2018-10-11 MED ORDER — INSULIN ASPART 100 UNIT/ML ~~LOC~~ SOLN
0.0000 [IU] | Freq: Three times a day (TID) | SUBCUTANEOUS | Status: DC
  Administered 2018-10-12: 5 [IU] via SUBCUTANEOUS
  Administered 2018-10-12: 3 [IU] via SUBCUTANEOUS
  Administered 2018-10-12: 2 [IU] via SUBCUTANEOUS
  Administered 2018-10-13: 3 [IU] via SUBCUTANEOUS
  Administered 2018-10-13 – 2018-10-14 (×2): 9 [IU] via SUBCUTANEOUS
  Filled 2018-10-11 (×7): qty 1

## 2018-10-11 NOTE — ED Triage Notes (Signed)
Pt arrives POV to triage with c/o abscess on his left hand and one on the back of his right arm. Pt reports that he has noticed these for the last two days. Pt is Type 1 diabetic and his left hand is swollen, red and warm to touch.

## 2018-10-11 NOTE — ED Notes (Signed)
MD Derrill Kay notified of pt's CBG of [phone]

## 2018-10-11 NOTE — ED Provider Notes (Signed)
[facility] Emergency Department Provider Note   ____________________________________________   I have reviewed the triage vital signs and the nursing notes.   HISTORY  Chief Complaint Abscess   History limited by: Not Limited   HPI Trevor Brown is a 28 y.o. male who presents to the emergency department today because of concern for abscess to left hand. The patient states that he first noticed it two days ago. Had injected heroin to that area. He states that it was a new needle. Denies any break in the needle. He does have pain associated with the swelling. He has not been checking his blood sugar because he dropped his glucometer.   Records reviewed. Per medical record review patient has a history of frequent ER visits, history of DKA.   Past Medical History:  Diagnosis Date  . Diabetes mellitus without complication (HCC)   . Heart attack (HCC)    Pt claims he had heart attack a year ago ( 2015)- and was admitted in [facility] for that, but not given any meds or angiogram, on review of chart- I could not find any details like that.  . Hepatitis C, acute may 2016  . Hypertension     Patient Active Problem List   Diagnosis Date Noted  . Cellulitis 08/17/2018  . Abdominal pain 06/29/2017  . Hyponatremia 06/29/2017  . DKA (diabetic ketoacidosis) (HCC) 06/29/2017  . Dental erosion extending into pulp 12/31/2016  . 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 Elevated transaminase level 03/27/2016  . Diabetes mellitus type 1 (HCC) 03/27/2016  . Tobacco abuse counseling 03/27/2016  . Transaminitis 03/25/2016  . Tobacco abuse 01/10/2016  . Type 1 diabetes mellitus with hyperglycemia (HCC) 12/12/2015  . Cocaine abuse (HCC) 12/12/2015  . DKA (diabetic ketoacidoses) (HCC) 06/10/2015  . Hidradenitis suppurativa of left axilla   . DKA, type 1 (HCC) 06/06/2015  . Malnutrition of moderate degree (HCC) 04/08/2015  . Hepatitis C 12/13/2014  . Diabetes type 1, uncontrolled (HCC)  12/11/2014  . Major depressive disorder, single episode, mild (HCC)   . Major depression, single episode 12/10/2014  . Diabetes mellitus type 1, uncontrolled (HCC) 12/09/2014  . Homelessness 12/09/2014    Past Surgical History:  Procedure Laterality Date  . none      Prior to Admission medications   Medication Sig Start Date End Date Taking? Authorizing Provider  acetaminophen (TYLENOL) 500 MG tablet Take 1,000 mg by mouth every 6 (six) hours as needed for pain. 06/17/18 06/17/19  [provider]  cefdinir (OMNICEF) 300 MG capsule Take 1 capsule (300 mg total) by mouth every 12 (twelve) hours. Patient not taking: Reported on 10/07/2018 08/21/18   Shaune Pollack, MD  insulin aspart (NOVOLOG) 100 UNIT/ML injection Inject 5-10 Units into the skin See admin instructions. INJECT BEFORE MEALS PER SLIDING SCALE AS DIRECTED: 120-250 = 5 UNITS;251-350 = 8 UNITS; OVER 350 = 10 UNITS 08/21/18   Shaune Pollack, MD  insulin aspart protamine- aspart (NOVOLOG MIX 70/30) (70-30) 100 UNIT/ML injection Inject 0.25 mLs (25 Units total) into the skin daily with breakfast. 08/22/18   Shaune Pollack, MD  insulin aspart protamine- aspart (NOVOLOG MIX 70/30) (70-30) 100 UNIT/ML injection Inject 0.28 mLs (28 Units total) into the skin daily with supper. 08/22/18   Shaune Pollack, MD  ondansetron (ZOFRAN) 4 MG tablet Take 1 tablet (4 mg total) by mouth every 8 (eight) hours as needed. 10/07/18   Phineas Semen, MD  sulfamethoxazole-trimethoprim (BACTRIM DS,SEPTRA DS) 800-160 MG tablet Take 2 tablets by mouth  every 12 (twelve) hours. Patient not taking: Reported on 10/07/2018 08/21/18   Shaune Pollack, MD    Allergies Bee venom; Penicillins; Ibuprofen; Tramadol; and Vancomycin  Family History  Problem Relation Age of Onset  . Cirrhosis Mother   . Diabetes Mellitus II Maternal Grandmother   . Diabetes Mellitus II Maternal Grandfather     Social History Social History   Tobacco Use  . Smoking status: Current Every Day Smoker     Packs/day: 2.00    Types: Cigarettes  . Smokeless tobacco: Never Used  Substance Use Topics  . Alcohol use: No    Alcohol/week: 0.0 standard drinks  . Drug use: Yes    Types: IV    Comment: heroin- last used October 04 2018    Review of Systems Constitutional: No fever/chills Eyes: No visual changes. ENT: No sore throat. Cardiovascular: Denies chest pain. Respiratory: Denies shortness of breath. Gastrointestinal: No abdominal pain.  No nausea, no vomiting.  No diarrhea.   Genitourinary: Negative for dysuria. Musculoskeletal: Positive for left hand pain and swelling.  Skin: Positive for swelling to left hand. Neurological: Negative for headaches, focal weakness or numbness.  ____________________________________________   PHYSICAL EXAM:  VITAL SIGNS: ED Triage Vitals  Enc Vitals Group     BP 10/11/18 1913 (!) 155/93     Pulse Rate 10/11/18 1913 94     Resp 10/11/18 1913 20     Temp 10/11/18 1913 99.2 F (37.3 C)     Temp Source 10/11/18 1913 Oral     SpO2 10/11/18 1913 99 %     Weight 10/11/18 1911 120 lb (54.4 kg)     Height 10/11/18 1911 5\' 4"  (1.626 m)     Head Circumference --      Peak Flow --      Pain Score 10/11/18 1911 10    Constitutional: Alert and oriented.  Eyes: Conjunctivae are normal.  ENT      Head: Normocephalic and atraumatic.      Nose: No congestion/rhinnorhea.      Mouth/Throat: Mucous membranes are moist.      Neck: No stridor. Hematological/Lymphatic/Immunilogical: No cervical lymphadenopathy. Cardiovascular: Normal rate, regular rhythm.  No murmurs, rubs, or gallops. Respiratory: Normal respiratory effort without tachypnea nor retractions. Breath sounds are clear and equal bilaterally. No wheezes/rales/rhonchi. Gastrointestinal: Soft and non tender. No rebound. No guarding.  Genitourinary: Deferred Musculoskeletal: Normal range of motion in all extremities. No lower extremity edema. Neurologic:  Normal speech and language. No gross  focal neurologic deficits are appreciated.  Skin:  Abscess noted to dorsum of left hand.  Psychiatric: Mood and affect are normal. Speech and behavior are normal. Patient exhibits appropriate insight and judgment.  ____________________________________________    LABS (pertinent positives/negatives)  CBC wbc 13.0, hgb 12.0, plt 254 BMP na 126, k 5.8, glu 753, anion gap 14 PH 7.39  ____________________________________________   EKG  I, Phineas Semen, attending physician, personally viewed and interpreted this EKG  EKG Time: 1932 Rate: 86 Rhythm: normal sinus rhythm Axis: right axis deviation Intervals: qtc 449 QRS: narrow ST changes: no st elevation Impression: abnormal ekg   ____________________________________________    RADIOLOGY  None  ____________________________________________   PROCEDURES  Procedures  Incision and Drainage of Abcess Location: left hand Anesthesia Local: 1% Lidocaine with Epi  Prep/Procedure: Skin Prep: Chlorahex Incised abscess with #11 blade Purulent discharge: large Probed to break up loculations Packed with 1/4" gauze Estimated blood loss: 2 ml  ____________________________________________   INITIAL IMPRESSION / ASSESSMENT  AND PLAN / ED COURSE  Pertinent labs & imaging results that were available during my care of the patient were reviewed by me and considered in my medical decision making (see chart for details).   Patient presented to the emergency department today because of concerns for pain and swelling to his left hand.  He was concerned for possible abscess.  This is a site that he injected heroin in 2 days prior.  On exam patient does have swelling and erythema to the dorsum of the left hand consistent with abscess.  This was incised and drained with a large amount of purulent drainage.  Patient has history of diabetes and has frequent diabetic ketoacidosis.  Blood sugar today 753 although no anion gap and pH is 7.39.   At this point I doubt DKA however given concern for elevated blood sugar and infection will plan on admission.  Discussed plan with patient.  ____________________________________________   FINAL CLINICAL IMPRESSION(S) / ED DIAGNOSES  Final diagnoses:  Abscess  Hyperglycemia     Note: This dictation was prepared with Dragon dictation. Any transcriptional errors that result from this process are unintentional     Phineas Semen, MD 10/11/18 2049

## 2018-10-11 NOTE — ED Notes (Signed)
Pharmacy called about Pt's Clindamycin, pharmacy said it has to be verified by the pharmacist

## 2018-10-11 NOTE — ED Notes (Signed)
ED TO INPATIENT HANDOFF REPORT  ED Nurse Name and Phone #: Reynolds Bowl [phone]   S Name/Age/Gender Trevor Brown 28 y.o. male Room/Bed: ED11A/ED11A  Code Status   Code Status: Prior  Home/SNF/Other Home Patient oriented to: self, place, time and situation Is this baseline? Yes   Triage Complete: Triage complete  Chief Complaint Abscess  Triage Note Pt arrives POV to triage with c/o abscess on his left hand and one on the back of his right arm. Pt reports that he has noticed these for the last two days. Pt is Type 1 diabetic and his left hand is swollen, red and warm to touch.    Allergies Allergies  Allergen Reactions  . Bee Venom Anaphylaxis  . Penicillins Anaphylaxis, Hives and Other (See Comments)    Has patient had a PCN reaction causing immediate rash, facial/tongue/throat swelling, SOB or lightheadedness with hypotension: Yes Has patient had a PCN reaction causing severe rash involving mucus membranes or skin necrosis: No Has patient had a PCN reaction that required hospitalization No Has patient had a PCN reaction occurring within the last 10 years: Yes If all of the above answers are "NO", then may proceed with Cephalosporin use.  . Ibuprofen Other (See Comments)    Other reaction(s): Other (See Comments) Stomach upset Reaction:  GI upset   . Tramadol Hives and Swelling  . Vancomycin Rash and Other (See Comments)    Reaction:  Red man's syndrome     Level of Care/Admitting Diagnosis ED Disposition    ED Disposition Condition Comment   Admit  The patient appears reasonably stabilized for admission considering the current resources, flow, and capabilities available in the ED at this time, and I doubt any other [facility] requiring further screening and/or treatment in the ED prior to admission is  present.       B Medical/Surgery History Past Medical History:  Diagnosis Date  . Diabetes mellitus without complication (HCC)   . Heart attack (HCC)    Pt claims  he had heart attack a year ago ( 2015)- and was admitted in [facility] for that, but not given any meds or angiogram, on review of chart- I could not find any details like that.  . Hepatitis C, acute may 2016  . Hypertension    Past Surgical History:  Procedure Laterality Date  . none       A IV Location/Drains/Wounds Patient Lines/Drains/Airways Status   Active Line/Drains/Airways    Name:   Placement date:   Placement time:   Site:   Days:   Peripheral IV 10/11/18 Right Antecubital   10/11/18    2000    Antecubital   less than 1   Peripheral IV 10/11/18 Right Forearm   10/11/18    1950    Forearm   less than 1   Incision (Closed) 08/21/18 Left   08/21/18    -     51   Wound / Incision (Open or Dehisced) 08/17/18 Non-pressure wound Elbow Left;Anterior   08/17/18    1600    Elbow   55          Intake/Output Last 24 hours No intake or output data in the 24 hours ending 10/11/18 2053  Labs/Imaging Results for orders placed or performed during the hospital encounter of 10/11/18 (from the past 48 hour(s))  Glucose, capillary     Status: Abnormal   Collection Time: 10/11/18  7:20 PM  Result Value Ref Range   Glucose-Capillary >600 (HH)  70 - 99 mg/dL  Glucose, capillary     Status: Abnormal   Collection Time: 10/11/18  7:21 PM  Result Value Ref Range   Glucose-Capillary >600 (HH) 70 - 99 mg/dL  Blood gas, venous     Status: None   Collection Time: 10/11/18  7:42 PM  Result Value Ref Range   pH, Ven 7.39 7.250 - 7.430   pCO2, Ven 45 44.0 - 60.0 mmHg   pO2, Ven 38.0 32.0 - 45.0 mmHg   Bicarbonate 27.2 20.0 - 28.0 mmol/L   Acid-Base Excess 1.7 0.0 - 2.0 mmol/L   O2 Saturation 71.5 %   Patient temperature 37.0    Collection site VEIN    Sample type VENOUS     Comment: Performed at [facility], [address] [place], [place] [address]  CBC with Differential     Status: Abnormal   Collection Time: 10/11/18  7:42 PM  Result Value Ref Range   WBC 13.0 (H) 4.0 - 10.5 K/uL    RBC 4.07 (L) 4.22 - 5.81 MIL/uL   Hemoglobin 12.0 (L) 13.0 - 17.0 g/dL   HCT 51.7 (L) 61.6 - 07.3 %   MCV 91.4 80.0 - 100.0 fL   MCH 29.5 26.0 - 34.0 pg   MCHC 32.3 30.0 - 36.0 g/dL   RDW 71.0 62.6 - 94.8 %   Platelets 254 150 - 400 K/uL   nRBC 0.0 0.0 - 0.2 %   Neutrophils Relative % 77 %   Neutro Abs 10.1 (H) 1.7 - 7.7 K/uL   Lymphocytes Relative 13 %   Lymphs Abs 1.7 0.7 - 4.0 K/uL   Monocytes Relative 7 %   Monocytes Absolute 0.9 0.1 - 1.0 K/uL   Eosinophils Relative 1 %   Eosinophils Absolute 0.1 0.0 - 0.5 K/uL   Basophils Relative 1 %   Basophils Absolute 0.1 0.0 - 0.1 K/uL   Immature Granulocytes 1 %   Abs Immature Granulocytes 0.07 0.00 - 0.07 K/uL    Comment: Performed at [facility], [address] [place], [place] [address]  Basic metabolic panel     Status: Abnormal   Collection Time: 10/11/18  7:42 PM  Result Value Ref Range   Sodium 126 (L) 135 - 145 mmol/L   Potassium 5.8 (H) 3.5 - 5.1 mmol/L   Chloride 88 (L) 98 - 111 mmol/L   CO2 24 22 - 32 mmol/L   Glucose, Bld 753 (HH) 70 - 99 mg/dL    Comment: CRITICAL RESULT CALLED TO, READ BACK BY AND VERIFIED WITH KENNY Yavier Snider RN AT 2020 10/11/2018.MSS    BUN 20 6 - 20 mg/dL   Creatinine, Ser 0.35 0.61 - 1.24 mg/dL   Calcium 8.5 (L) 8.9 - 10.3 mg/dL   GFR calc non Af Amer >60 >60 mL/min   GFR calc Af Amer >60 >60 mL/min   Anion gap 14 5 - 15    Comment: Performed at [facility], [address] [place], [place] [address]   No results found.  Pending Labs Unresulted Labs (From admission, onward)    Start     Ordered   10/11/18 1925  Urinalysis, Complete w Microscopic  Once,   STAT     10/11/18 1924          Vitals/Pain Today's Vitals   10/11/18 1911 10/11/18 1913 10/11/18 1935  BP:  (!) 155/93   Pulse:  94   Resp:  20   Temp:  99.2 F (37.3  C)   TempSrc:  Oral   SpO2:  99%   Weight: 54.4 kg    Height: 5\' 4"  (1.626 m)    PainSc: 10-Worst pain ever  10-Worst pain ever     Isolation Precautions No active isolations  Medications Medications  gabapentin (NEURONTIN) capsule 300 mg (has no administration in time range)  clindamycin (CLEOCIN) IVPB 600 mg (has no administration in time range)  sodium chloride 0.9 % bolus 1,000 mL (1,000 mLs Intravenous New Bag/Given 10/11/18 2000)  sodium chloride 0.9 % bolus 1,000 mL (1,000 mLs Intravenous New Bag/Given 10/11/18 2000)  acetaminophen (TYLENOL) tablet 1,000 mg (1,000 mg Oral Given 10/11/18 2004)  lidocaine (PF) (XYLOCAINE) 1 % injection 5 mL (5 mLs Other Given 10/11/18 2037)  insulin aspart (novoLOG) injection 20 Units (20 Units Intravenous Given 10/11/18 2033)    Mobility walks Low fall risk   Focused Assessments Cardiac Assessment Handoff:    Lab Results  Component Value Date   TROPONINI <0.03 10/07/2018   No results found for: DDIMER Does the Patient currently have chest pain? No  , Neuro Assessment Handoff:  Swallow screen pass? No          Neuro Assessment: Within Defined Limits Neuro Checks:      Last Documented NIHSS Modified Score:   Has TPA been given? No If patient is a Neuro Trauma and patient is going to OR before floor call report to 4N Charge nurse: [phone] or [phone]  , Pulmonary Assessment Handoff:  Lung sounds:   O2 Device: Room Air        R Recommendations: See Admitting Provider Note  Report given to:   Additional Notes:

## 2018-10-11 NOTE — ED Notes (Signed)
Pt states being type I diabetic and getting an abscess on the left hand/wrist area following "shooting up heroin" Pt states pain is 10/10 at this time. 2nd abscess noted on the back of the right arm above the elbow.

## 2018-10-11 NOTE — H&P (Signed)
[facility] [place] at [facility]   PATIENT NAME: Trevor Brown    MR#:  [id number]  DATE OF BIRTH:  05-Aug-1990  DATE OF ADMISSION:  10/11/2018  PRIMARY CARE PHYSICIAN: Patient, No Pcp Per   REQUESTING/REFERRING PHYSICIAN: Derrill Kay, MD  CHIEF COMPLAINT:   Chief Complaint  Patient presents with  . Abscess    HISTORY OF PRESENT ILLNESS:  Trevor Brown  is a 28 y.o. male who presents with chief complaint as above.  Patient presents the ED with a complaint of left hand erythema and swelling.  He had a drainable abscess, drained by ED physician.  He has cellulitis extending up his arm.  He states that he injected IV heroin about 2 days ago.  He is also diabetic and is not in DKA, but does have significantly elevated glucose with some corresponding electrolyte derangements.  Hospitalist called for admission  PAST MEDICAL HISTORY:   Past Medical History:  Diagnosis Date  . Diabetes mellitus without complication (HCC)   . Heart attack (HCC)    Pt claims he had heart attack a year ago ( 2015)- and [facility], but not given any meds or angiogram, on review of chart- I could not find any details like that.  . Hepatitis C, acute may 2016  . Hypertension      PAST SURGICAL HISTORY:   Past Surgical History:  Procedure Laterality Date  . none       SOCIAL HISTORY:   Social History   Tobacco Use  . Smoking status: Current Every Day Smoker    Packs/day: 2.00    Types: Cigarettes  . Smokeless tobacco: Never Used  Substance Use Topics  . Alcohol use: No    Alcohol/week: 0.0 standard drinks     FAMILY HISTORY:   Family History  Problem Relation Age of Onset  . Cirrhosis Mother   . Diabetes Mellitus II Maternal Grandmother   . Diabetes Mellitus II Maternal Grandfather      DRUG ALLERGIES:   Allergies  Allergen Reactions  . Bee Venom Anaphylaxis  . Penicillins Anaphylaxis, Hives and Other (See Comments)    Has patient had a  PCN reaction causing immediate rash, facial/tongue/throat swelling, SOB or lightheadedness with hypotension: Yes Has patient had a PCN reaction causing severe rash involving mucus membranes or skin necrosis: No Has patient had a PCN reaction that required hospitalization No Has patient had a PCN reaction occurring within the last 10 years: Yes If all of the above answers are "NO", then may proceed with Cephalosporin use.  . Ibuprofen Other (See Comments)    Other reaction(s): Other (See Comments) Stomach upset Reaction:  GI upset   . Tramadol Hives and Swelling  . Vancomycin Rash and Other (See Comments)    Reaction:  Red man's syndrome     MEDICATIONS AT HOME:   Prior to Admission medications   Medication Sig Start Date End Date Taking? Authorizing Provider  acetaminophen (TYLENOL) 500 MG tablet Take 1,000 mg by mouth every 6 (six) hours as needed for pain. 06/17/18 06/17/19  [provider]  cefdinir (OMNICEF) 300 MG capsule Take 1 capsule (300 mg total) by mouth every 12 (twelve) hours. Patient not taking: Reported on 10/07/2018 08/21/18   Shaune Pollack, MD  insulin aspart (NOVOLOG) 100 UNIT/ML injection Inject 5-10 Units into the skin See admin instructions. INJECT BEFORE MEALS PER SLIDING SCALE AS DIRECTED: 120-250 = 5 UNITS;251-350 = 8 UNITS; OVER 350 = 10 UNITS  08/21/18   Shaune Pollackhen, Qing, MD  insulin aspart protamine- aspart (NOVOLOG MIX 70/30) (70-30) 100 UNIT/ML injection Inject 0.25 mLs (25 Units total) into the skin daily with breakfast. 08/22/18   Shaune Pollackhen, Qing, MD  insulin aspart protamine- aspart (NOVOLOG MIX 70/30) (70-30) 100 UNIT/ML injection Inject 0.28 mLs (28 Units total) into the skin daily with supper. 08/22/18   Shaune Pollackhen, Qing, MD  ondansetron (ZOFRAN) 4 MG tablet Take 1 tablet (4 mg total) by mouth every 8 (eight) hours as needed. 10/07/18   Phineas SemenGoodman, Graydon, MD  sulfamethoxazole-trimethoprim (BACTRIM DS,SEPTRA DS) 800-160 MG tablet Take 2 tablets by mouth every 12 (twelve)  hours. Patient not taking: Reported on 10/07/2018 08/21/18   Shaune Pollackhen, Qing, MD    REVIEW OF SYSTEMS:  Review of Systems  Constitutional: Negative for chills, fever, malaise/fatigue and weight loss.  HENT: Negative for ear pain, hearing loss and tinnitus.   Eyes: Negative for blurred vision, double vision, pain and redness.  Respiratory: Negative for cough, hemoptysis and shortness of breath.   Cardiovascular: Negative for chest pain, palpitations, orthopnea and leg swelling.  Gastrointestinal: Negative for abdominal pain, constipation, diarrhea, nausea and vomiting.  Genitourinary: Negative for dysuria, frequency and hematuria.  Musculoskeletal: Negative for back pain, joint pain and neck pain.  Skin:       Left hand and arm erythema, tenderness, warmth  Neurological: Negative for dizziness, tremors, focal weakness and weakness.  Endo/Heme/Allergies: Negative for polydipsia. Does not bruise/bleed easily.  Psychiatric/Behavioral: Negative for depression. The patient is not nervous/anxious and does not have insomnia.      VITAL SIGNS:   Vitals:   10/11/18 1913 10/11/18 1930 10/11/18 2015 10/11/18 2100  BP: (!) 155/93 (!) 145/94  (!) 145/89  Pulse: 94 90 82 82  Resp: 20 (!) 21 (!) 24 (!) 22  Temp: 99.2 F (37.3 C)     TempSrc: Oral     SpO2: 99% 100% 100% 100%  Weight:      Height:       Wt Readings from Last 3 Encounters:  10/11/18 54.4 kg  10/07/18 54.4 kg  08/18/18 50.8 kg    PHYSICAL EXAMINATION:  Physical Exam  Vitals reviewed. Constitutional: He is oriented to person, place, and time. He appears well-developed and well-nourished. No distress.  HENT:  Head: Normocephalic and atraumatic.  Mouth/Throat: Oropharynx is clear and moist.  Eyes: Pupils are equal, round, and reactive to light. Conjunctivae and EOM are normal. No scleral icterus.  Neck: Normal range of motion. Neck supple. No JVD present. No thyromegaly present.  Cardiovascular: Normal rate, regular rhythm and  intact distal pulses. Exam reveals no gallop and no friction rub.  No murmur heard. Respiratory: Effort normal and breath sounds normal. No respiratory distress. He has no wheezes. He has no rales.  GI: Soft. Bowel sounds are normal. He exhibits no distension. There is no abdominal tenderness.  Musculoskeletal: Normal range of motion.        General: No edema.     Comments: No arthritis, no gout  Lymphadenopathy:    He has no cervical adenopathy.  Neurological: He is alert and oriented to person, place, and time. No cranial nerve deficit.  No dysarthria, no aphasia  Skin: Skin is warm and dry. No rash noted. There is erythema (With tenderness and warmth and drainable abscess of the left hand).  Psychiatric: He has a normal mood and affect. His behavior is normal. Judgment and thought content normal.    LABORATORY PANEL:   CBC Recent Labs  Lab 10/11/18 1942  WBC 13.0*  HGB 12.0*  HCT 37.2*  PLT 254   ------------------------------------------------------------------------------------------------------------------  Chemistries  Recent Labs  Lab 10/11/18 1942  NA 126*  K 5.8*  CL 88*  CO2 24  GLUCOSE 753*  BUN 20  CREATININE 0.91  CALCIUM 8.5*   ------------------------------------------------------------------------------------------------------------------  Cardiac Enzymes Recent Labs  Lab 10/07/18 1806  TROPONINI <0.03   ------------------------------------------------------------------------------------------------------------------  RADIOLOGY:  No results found.  EKG:   Orders placed or performed in visit on 10/11/18  . EKG 12-Lead    IMPRESSION AND PLAN:  Principal Problem:   Type 1 diabetes mellitus with hyperglycemia (HCC) -given bolus dose of insulin in the ED with fluids, recheck glucose was much improved, will continue with insulin coverage tonight until we get his glucose to a better level.  Expect electrolytes will correct once glucose has  corrected and patient is hydrated.  Recheck BMP at midnight Active Problems:   Cellulitis and abscess of hand -abscess drained by ED physician, IV antibiotics initiated   Hyperkalemia -recheck BMP at midnight and if not improved will give something like [place]   IVDU (intravenous drug user) -predisposed him to cellulitis, encouraged drug use cessation  Chart review performed and case discussed with ED provider. Labs, imaging and/or ECG reviewed by provider and discussed with patient/family. Management plans discussed with the patient and/or family.  DVT PROPHYLAXIS: SubQ lovenox   GI PROPHYLAXIS:  None  ADMISSION STATUS: Observation  CODE STATUS: Full Code Status History    Date Active Date Inactive Code Status Order ID Comments User Context   08/17/2018 1609 08/23/2018 0100 Full Code [id number]  Altamese Dilling, MD Inpatient   06/29/2017 1354 06/30/2017 1850 Full Code [id number]  Marguarite Arbour, MD Inpatient   06/05/2017 0456 06/07/2017 2052 Full Code [id number]  Arnaldo Natal, MD Inpatient   06/04/2017 0259 06/05/2017 0456 Full Code [id number]  Ihor Austin, MD Inpatient   04/17/2017 1055 04/21/2017 1559 Full Code [id number]  Ramonita Lab, MD Inpatient   02/03/2017 1336 02/04/2017 2128 Full Code [id number]  Gracelyn Nurse, MD ED   10/13/2016 0526 10/15/2016 1812 Full Code [id number]  Arnaldo Natal, MD Inpatient   09/08/2016 0352 09/08/2016 1837 Full Code [id number]  Hugelmeyer, Alexis, DO Inpatient   08/12/2016 2220 08/13/2016 1710 Full Code [id number]  Katharina Caper, MD Inpatient   07/04/2016 1224 07/05/2016 1936 Full Code [id number]  Altamese Dilling, MD Inpatient   07/04/2016 1224 07/04/2016 1224 Full Code [id number]  Altamese Dilling, MD Inpatient   04/09/2016 1654 04/11/2016 1409 Full Code [id number]  Katharina Caper, MD Inpatient   03/26/2016 0118 03/27/2016 1911 Full Code [id number]  Tonye Royalty, DO Inpatient   03/18/2016 0815 03/21/2016 2035 Full Code [id number]   Wyatt Haste, MD ED   03/12/2016 1220 03/16/2016 1927 Full Code [id number]  Enedina Finner, MD Inpatient   02/18/2016 1659 02/19/2016 1718 Full Code [id number]  Ramonita Lab, MD ED   01/12/2016 0858 01/15/2016 1933 Full Code [id number]  Milagros Loll, MD ED   01/11/2016 0035 01/11/2016 2320 Full Code [id number]  Gery Pray, MD Inpatient   12/12/2015 2204 12/14/2015 1838 Full Code [id number]  Katharina Caper, MD Inpatient   10/23/2015 1742 10/25/2015 1904 Full Code [id number]  Katha Hamming, MD ED   06/10/2015 0820 06/12/2015 1441 Full Code [id number]  Arnaldo Natal, MD Inpatient   06/06/2015 2043 06/07/2015 1620 Full Code [id number]  Enedina Finner, MD Inpatient   04/07/2015 1245 04/08/2015 2037 Full Code [id number]  Altamese Dilling, MD Inpatient  12/09/2014 1444 12/13/2014 1616 Full Code [id number]  Gale Journey, MD Inpatient      TOTAL TIME TAKING CARE OF THIS PATIENT: 40 minutes.   Barney Drain 10/11/2018, 9:09 PM  [facility] [place] [facility]  Office  [phone]  CC: Primary care physician; Patient, No Pcp Per  Note:  This document was prepared using Dragon voice recognition software and may include unintentional dictation errors.

## 2018-10-12 LAB — GLUCOSE, CAPILLARY
Glucose-Capillary: 178 mg/dL — ABNORMAL HIGH (ref 70–99)
Glucose-Capillary: 204 mg/dL — ABNORMAL HIGH (ref 70–99)
Glucose-Capillary: 296 mg/dL — ABNORMAL HIGH (ref 70–99)
Glucose-Capillary: 401 mg/dL — ABNORMAL HIGH (ref 70–99)
Glucose-Capillary: 405 mg/dL — ABNORMAL HIGH (ref 70–99)
Glucose-Capillary: 447 mg/dL — ABNORMAL HIGH (ref 70–99)
Glucose-Capillary: 475 mg/dL — ABNORMAL HIGH (ref 70–99)
Glucose-Capillary: 483 mg/dL — ABNORMAL HIGH (ref 70–99)
Glucose-Capillary: >600 mg/dL (ref 70–99)

## 2018-10-12 LAB — BASIC METABOLIC PANEL
Anion gap: 16 — ABNORMAL HIGH (ref 5–15)
BUN: 16 mg/dL (ref 6–20)
CO2: 20 mmol/L — ABNORMAL LOW (ref 22–32)
Calcium: 8 mg/dL — ABNORMAL LOW (ref 8.9–10.3)
Chloride: 99 mmol/L (ref 98–111)
Creatinine, Ser: 0.87 mg/dL (ref 0.61–1.24)
GFR calc Af Amer: >60 mL/min (ref >60)
GFR calc non Af Amer: >60 mL/min (ref >60)
Glucose, Bld: 488 mg/dL — ABNORMAL HIGH (ref 70–99)
Potassium: 3.8 mmol/L (ref 3.5–5.1)
Sodium: 135 mmol/L (ref 135–145)

## 2018-10-12 LAB — CBC
HCT: 33.7 % — ABNORMAL LOW (ref 39.0–52.0)
Hemoglobin: 10.9 g/dL — ABNORMAL LOW (ref 13.0–17.0)
MCH: 29.1 pg (ref 26.0–34.0)
MCHC: 32.3 g/dL (ref 30.0–36.0)
MCV: 89.9 fL (ref 80.0–100.0)
Platelets: 239 10*3/uL (ref 150–400)
RBC: 3.75 MIL/uL — ABNORMAL LOW (ref 4.22–5.81)
RDW: 13.2 % (ref 11.5–15.5)
WBC: 12.2 10*3/uL — ABNORMAL HIGH (ref 4.0–10.5)
nRBC: 0 % (ref 0.0–0.2)

## 2018-10-12 LAB — GLUCOSE, RANDOM: Glucose, Bld: 498 mg/dL — ABNORMAL HIGH (ref 70–99)

## 2018-10-12 MED ORDER — MORPHINE SULFATE (PF) 2 MG/ML IV SOLN
2.0000 mg | INTRAVENOUS | Status: DC | PRN
  Administered 2018-10-12: 2 mg via INTRAVENOUS
  Filled 2018-10-12: qty 1

## 2018-10-12 MED ORDER — INSULIN GLARGINE 100 UNIT/ML ~~LOC~~ SOLN
8.0000 [IU] | Freq: Every day | SUBCUTANEOUS | Status: AC
  Administered 2018-10-12: 8 [IU] via SUBCUTANEOUS
  Filled 2018-10-12: qty 0.08

## 2018-10-12 MED ORDER — CLINDAMYCIN PHOSPHATE 600 MG/50ML IV SOLN
600.0000 mg | Freq: Three times a day (TID) | INTRAVENOUS | Status: DC
  Administered 2018-10-12 – 2018-10-15 (×9): 600 mg via INTRAVENOUS
  Filled 2018-10-12 (×15): qty 50

## 2018-10-12 MED ORDER — INSULIN ASPART PROT & ASPART (70-30 MIX) 100 UNIT/ML ~~LOC~~ SUSP
10.0000 [IU] | Freq: Once | SUBCUTANEOUS | Status: AC
  Administered 2018-10-12: 10 [IU] via SUBCUTANEOUS
  Filled 2018-10-12: qty 10

## 2018-10-12 MED ORDER — INSULIN ASPART 100 UNIT/ML ~~LOC~~ SOLN
25.0000 [IU] | Freq: Once | SUBCUTANEOUS | Status: AC
  Administered 2018-10-12: 25 [IU] via SUBCUTANEOUS
  Filled 2018-10-12: qty 1

## 2018-10-12 MED ORDER — FUROSEMIDE 10 MG/ML IJ SOLN: 40.0000 mg | Freq: Two times a day (BID) | INTRAMUSCULAR | Status: DC

## 2018-10-12 MED ORDER — INSULIN REGULAR HUMAN 100 UNIT/ML IJ SOLN
10.0000 [IU] | Freq: Once | INTRAMUSCULAR | Status: AC
  Administered 2018-10-12: 10 [IU] via INTRAVENOUS
  Filled 2018-10-12: qty 10

## 2018-10-12 MED ORDER — INSULIN ASPART 100 UNIT/ML ~~LOC~~ SOLN
12.0000 [IU] | Freq: Once | SUBCUTANEOUS | Status: AC
  Administered 2018-10-12: 12 [IU] via SUBCUTANEOUS
  Filled 2018-10-12: qty 1

## 2018-10-12 MED ORDER — OXYCODONE-ACETAMINOPHEN 5-325 MG PO TABS
1.0000 | ORAL_TABLET | Freq: Four times a day (QID) | ORAL | Status: DC | PRN
  Administered 2018-10-12 – 2018-10-15 (×13): 1 via ORAL
  Filled 2018-10-12 (×13): qty 1

## 2018-10-12 NOTE — Progress Notes (Signed)
Inpatient Diabetes Program Recommendations  AACE/ADA: New Consensus Statement on Inpatient Glycemic Control (2015)  Target Ranges:  Prepandial:   less than 140 mg/dL      Peak postprandial:   less than 180 mg/dL (1-2 hours)      Critically ill patients:  140 - 180 mg/dL   Results for Trevor Brown, Trevor Brown (MRN [id number]) as of 10/12/2018 15:25  Ref. Range 10/12/2018 07:59 10/12/2018 11:49 10/12/2018 12:28 10/12/2018 14:32  Glucose-Capillary Latest Ref Range: 70 - 99 mg/dL 601 (H) 093 (H) 235 (H) 405 (H)    Admit with: Hyperglycemia/ L hand erythema and swelling/ Drainable abscess, drained by ED physician/ Cellulitis extending up his arm/ Pt states he injected IV heroin about 2 days ago  History: Type 1 Diabetes, IV Drug Abuse  Home DM Meds: 70/30 Insulin 25 units AM/ 28 units PM                             Novolog 5-10 units TID per SSI  Current Orders: 70/30 Insulin 25 units AM/ 28 units PM                            Novolog Sensitive Correction Scale/ SSI (0-9 units) TID AC + HS      Patient well known to the Inpatient Diabetes Team.  Frequent admissions.  Very difficult for pt to control his glucose at home given his issues with homelessness in the past and his issues with IV Drug Abuse.  Poor social support.  70/30 Insulin and Novolog SSI both started this AM.  Per RN notes, patient requesting frequent snacks and food.  It will be difficult to control CBGs if pt eating constantly.    --Will follow patient during hospitalization--  Ambrose Finland RN, MSN, CDE Diabetes Coordinator Inpatient Glycemic Control Team Team Pager: [phone] (8a-5p)

## 2018-10-12 NOTE — Progress Notes (Signed)
Inpatient Diabetes Program Recommendations  AACE/ADA: New Consensus Statement on Inpatient Glycemic Control (2015)  Target Ranges:  Prepandial:   less than 140 mg/dL      Peak postprandial:   less than 180 mg/dL (1-2 hours)      Critically ill patients:  140 - 180 mg/dL   Results for Trevor Brown, Trevor Brown (MRN [id number]) as of 10/12/2018 08:07  Ref. Range 10/11/2018 19:20 10/11/2018 19:21 10/11/2018 21:44 10/11/2018 23:02 10/12/2018 00:53 10/12/2018 02:15  Glucose-Capillary Latest Ref Range: 70 - 99 mg/dL >628 (HH) >315 (HH)  20 units NOVOLOG given at 8:33pm 319 (H) 355 (H) 475 (H)  10 units Regular Insulin given at 1:12am 447 (H)  12 units NOVOLOG given at 3:30am +  8 units Lantus given at 3:30am    Admit with: Hyperglycemia/ L hand erythema and swelling/ Drainable abscess, drained by ED physician/ Cellulitis extending up his arm/ Pt states he injected IV heroin about 2 days ago  History: Type 1 Diabetes, IV Drug Abuse  Home DM Meds: 70/30 Insulin 25 units AM/ 28 units PM       Novolog 5-10 units TID per SSI  Current Orders: 70/30 Insulin 25 units AM/ 28 units PM      Novolog Sensitive Correction Scale/ SSI (0-9 units) TID AC + HS      Patient well known to the Inpatient Diabetes Team.  Frequent admissions.  Very difficult for pt to control his glucose at home given his issues with homelessness in the past and his issues with IV Drug Abuse.  Poor social support.  70/30 Insulin and Novolog SSI both to start this AM.  Will follow during hospitalization.     --Will follow patient during hospitalization--  Ambrose Finland RN, MSN, CDE Diabetes Coordinator Inpatient Glycemic Control Team Team Pager: [phone] (8a-5p)

## 2018-10-12 NOTE — Progress Notes (Signed)
[facility] [place] at [facility]   PATIENT NAME: Trevor Brown    MR#:  [id number]  DATE OF BIRTH:  1990/11/18  SUBJECTIVE:  CHIEF COMPLAINT:   Chief Complaint  Patient presents with  . Abscess  Patient seen today Decreased to pain and swelling in the left hand No complaints of nausea and vomiting  REVIEW OF SYSTEMS:    ROS  CONSTITUTIONAL: No documented fever. No fatigue, weakness. No weight gain, no weight loss.  EYES: No blurry or double vision.  ENT: No tinnitus. No postnasal drip. No redness of the oropharynx.  RESPIRATORY: No cough, no wheeze, no hemoptysis. No dyspnea.  CARDIOVASCULAR: No chest pain. No orthopnea. No palpitations. No syncope.  GASTROINTESTINAL: No nausea, no vomiting or diarrhea. No abdominal pain. No melena or hematochezia.  GENITOURINARY: No dysuria or hematuria.  ENDOCRINE: No polyuria or nocturia. No heat or cold intolerance.  HEMATOLOGY: No anemia. No bruising. No bleeding.  INTEGUMENTARY: No rashes. No lesions.  MUSCULOSKELETAL: No arthritis.  No gout.  Decreased swelling left hand NEUROLOGIC: No numbness, tingling, or ataxia. No seizure-type activity.  PSYCHIATRIC: No anxiety. No insomnia. No ADD.   DRUG ALLERGIES:   Allergies  Allergen Reactions  . Bee Venom Anaphylaxis  . Penicillins Anaphylaxis, Hives and Other (See Comments)    Has patient had a PCN reaction causing immediate rash, facial/tongue/throat swelling, SOB or lightheadedness with hypotension: Yes Has patient had a PCN reaction causing severe rash involving mucus membranes or skin necrosis: No Has patient had a PCN reaction that required hospitalization No Has patient had a PCN reaction occurring within the last 10 years: Yes If all of the above answers are "NO", then may proceed with Cephalosporin use.  . Ibuprofen Other (See Comments)    Other reaction(s): Other (See Comments) Stomach upset Reaction:  GI upset   . Tramadol Hives and Swelling  .  Vancomycin Rash and Other (See Comments)    Reaction:  Red man's syndrome     VITALS:  Blood pressure (!) 132/93, pulse 73, temperature 98.4 F (36.9 C), temperature source Oral, resp. rate 15, height 5\' 4"  (1.626 m), weight 54.4 kg, SpO2 100 %.  PHYSICAL EXAMINATION:   Physical Exam  GENERAL:  28 y.o.-year-old patient lying in the bed with no acute distress.  EYES: Pupils equal, round, reactive to light and accommodation. No scleral icterus. Extraocular muscles intact.  HEENT: Head atraumatic, normocephalic. Oropharynx and nasopharynx clear.  NECK:  Supple, no jugular venous distention. No thyroid enlargement, no tenderness.  LUNGS: Normal breath sounds bilaterally, no wheezing, rales, rhonchi. No use of accessory muscles of respiration.  CARDIOVASCULAR: S1, S2 normal. No murmurs, rubs, or gallops.  ABDOMEN: Soft, nontender, nondistended. Bowel sounds present. No organomegaly or mass.  EXTREMITIES: No cyanosis, clubbing or edema b/l. Left hand erythema and swelling improved    NEUROLOGIC: Cranial nerves II through XII are intact. No focal Motor or sensory deficits b/l.   PSYCHIATRIC: The patient is alert and oriented x 3.  SKIN: Redness of the skin of the left hand  LABORATORY PANEL:   CBC Recent Labs  Lab 10/12/18 0010  WBC 12.2*  HGB 10.9*  HCT 33.7*  PLT 239   ------------------------------------------------------------------------------------------------------------------ Chemistries  Recent Labs  Lab 10/12/18 0010  NA 135  K 3.8  CL 99  CO2 20*  GLUCOSE 488*  BUN 16  CREATININE 0.87  CALCIUM 8.0*   ------------------------------------------------------------------------------------------------------------------  Cardiac Enzymes Recent Labs  Lab 10/07/18 1806  TROPONINI <0.03   ------------------------------------------------------------------------------------------------------------------  RADIOLOGY:  No results found.   ASSESSMENT AND PLAN:   28 year old male patientWith history of type 1 diabetes mellitus, hepatitis C, hypertension Presented to the emergency room for elevated blood sugar and redness and swelling in the left hand.  -Uncontrolled diabetes mellitus IV fluids Control blood sugars with sliding scale coverage with insulin, 70/30 before meals and lantus insulin. Diabetic teaching  -Cellulitis and abscess of the hand Has been drained IV antibiotics clindamycin on board  -Hyperkalemia improved  -Substance abuse Counseling given to the patient  -Tobacco abuse Tobacco cessation counseled to the patient for 6 minutes Nicotine patch offered  All the records are reviewed and case discussed with Care Management/Social Worker. Management plans discussed with the patient, family and they are in agreement.  CODE STATUS: Full code  DVT Prophylaxis: SCDs  TOTAL TIME TAKING CARE OF THIS PATIENT: 36 minutes.   POSSIBLE D/C IN 2 to 3 DAYS, DEPENDING ON CLINICAL CONDITION.  Ihor Austin M.D on 10/12/2018 at 10:48 AM  Between 7am to 6pm - Pager - [phone]  After 6pm go to [email or web address] - password [email or web address]  [facility] [place] [facility]  Office  [phone]  CC: Primary care physician; Patient, No Pcp Per  Note: This dictation was prepared with Dragon dictation along with smaller phrase technology. Any transcriptional errors that result from this process are unintentional.

## 2018-10-12 NOTE — TOC Initial Note (Signed)
Transition of Care (TOC) - Initial/Assessment Note  Met with the patient to discuss needs and DC plan. He was staying at the homeless shelter however once you leave you have to wait 120 days to go back and he has not been gone for 120 days yet.  He states that he is staying under bridges and in vacant buildings.  I asked him if he has any family or friends that can help, he said no due to the drugs that he uses noone will help him.  I asked him how was he getting his Diabetes medication.  He stated that he is open with [facility] and they supply his medications.  He stated that he is checking his Blood sugars 2 times a day and using his insulin. I asked him if he was using every day like it is prescribed, he said most of the time.  He feels his Blood sugars were elevated on admission because of the infection in his hand.  I provided him with the [place] purple booklet for resources.  He states that he does not have any needs.  He will call me within this admission if he thinks of anything he needs.    Patient Details  Name: Trevor Brown MRN: [id number] Date of Birth: [birth year]  Transition of Care [facility]) CM/SW Contact:    Su Hilt, RN Phone Number: 10/12/2018, 2:14 PM  Clinical Narrative:                   Expected Discharge Plan: Home/Self Care Barriers to Discharge: Continued Medical Work up   Patient Goals and CMS Choice Patient states their goals for this hospitalization and ongoing recovery are:: get better      Expected Discharge Plan and Services Expected Discharge Plan: Home/Self Care       Living arrangements for the past 2 months: Homeless(is living outside under bridges and in vacant buildings)                          Prior Living Arrangements/Services Living arrangements for the past 2 months: Homeless(is living outside under bridges and in vacant buildings) Lives with:: Self Patient language and need for interpreter reviewed:: Yes Do you feel  safe going back to the place where you live?: Yes      Need for Family Participation in Patient Care: No (Comment) Care giver support system in place?: No (comment)   Criminal Activity/Legal Involvement Pertinent to Current Situation/Hospitalization: No - Comment as needed  Activities of Daily Living Home Assistive Devices/Equipment: None ADL Screening (condition at time of admission) Patient's cognitive ability adequate to safely complete daily activities?: Yes Is the patient deaf or have difficulty hearing?: No Does the patient have difficulty seeing, even when wearing glasses/contacts?: No Does the patient have difficulty concentrating, remembering, or making decisions?: No Patient able to express need for assistance with ADLs?: Yes Does the patient have difficulty dressing or bathing?: No Independently performs ADLs?: Yes (appropriate for developmental age) Does the patient have difficulty walking or climbing stairs?: No Weakness of Legs: None Weakness of Arms/Hands: Both  Permission Sought/Granted                  Emotional Assessment Appearance:: Appears stated age Attitude/Demeanor/Rapport: Engaged Affect (typically observed): Accepting Orientation: : Oriented to Self, Oriented to Place, Oriented to  Time, Oriented to Situation Alcohol / Substance Use: Alcohol Use, Illicit Drugs, Tobacco Use Psych Involvement: No (comment)  Admission diagnosis:  Abscess [L02.91] Hyperglycemia [R73.9] Patient Active Problem List   Diagnosis Date Noted  . IVDU (intravenous drug user) 10/11/2018  . Hyperkalemia 10/11/2018  . Cellulitis and abscess of hand 08/17/2018  . Abdominal pain 06/29/2017  . Hyponatremia 06/29/2017  . DKA (diabetic ketoacidosis) ([place]) 06/29/2017  . Dental erosion extending into pulp 12/31/2016  . 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 Elevated transaminase level 03/27/2016  . Diabetes mellitus type 1 ([place]) 03/27/2016  . Tobacco abuse counseling 03/27/2016  .  Transaminitis 03/25/2016  . Tobacco abuse 01/10/2016  . Type 1 diabetes mellitus with hyperglycemia ([place]) 12/12/2015  . Cocaine abuse ([place]) 12/12/2015  . DKA (diabetic ketoacidoses) ([place]) 06/10/2015  . Hidradenitis suppurativa of left axilla   . DKA, type 1 ([place]) 06/06/2015  . Malnutrition of moderate degree ([place]) 04/08/2015  . Hepatitis C 12/13/2014  . Diabetes type 1, uncontrolled ([place]) 12/11/2014  . Major depressive disorder, single episode, mild ([place])   . Major depression, single episode 12/10/2014  . Diabetes mellitus type 1, uncontrolled ([place]) 12/09/2014  . Homelessness 12/09/2014   PCP:  Patient, No Pcp Per Pharmacy:   [facility] [address] [place] [address] Phone: [phone] Fax: [phone]  [facility] [place] [place] [address] Phone: [phone] Fax: [phone]     Social Determinants of Health (SDOH) Interventions    Readmission Risk Interventions No flowsheet data found.

## 2018-10-12 NOTE — Progress Notes (Signed)
Glucose is 483. STAT glucose ordered from lab to verify per policy. Dr.Pyreddy notified. Pt has been requesting snacks/food frequently today.

## 2018-10-12 NOTE — Progress Notes (Signed)
Pt's Blood Glucose 178. MD Pyreddy paged concerning 28 additional units of Aspart. Verbal orders to only give 10 units of aspart this dose.

## 2018-10-13 ENCOUNTER — Inpatient Hospital Stay: Payer: Self-pay

## 2018-10-13 DIAGNOSIS — L02519 Cutaneous abscess of unspecified hand: Secondary | ICD-10-CM | POA: Diagnosis present

## 2018-10-13 LAB — CBC
HCT: 35.6 % — ABNORMAL LOW (ref 39.0–52.0)
Hemoglobin: 11.5 g/dL — ABNORMAL LOW (ref 13.0–17.0)
MCH: 29 pg (ref 26.0–34.0)
MCHC: 32.3 g/dL (ref 30.0–36.0)
MCV: 89.9 fL (ref 80.0–100.0)
Platelets: 248 10*3/uL (ref 150–400)
RBC: 3.96 MIL/uL — ABNORMAL LOW (ref 4.22–5.81)
RDW: 13.2 % (ref 11.5–15.5)
WBC: 9.4 10*3/uL (ref 4.0–10.5)
nRBC: 0 % (ref 0.0–0.2)

## 2018-10-13 LAB — GLUCOSE, CAPILLARY
Glucose-Capillary: 446 mg/dL — ABNORMAL HIGH (ref 70–99)
Glucose-Capillary: 355 mg/dL — ABNORMAL HIGH (ref 70–99)
Glucose-Capillary: 91 mg/dL (ref 70–99)
Glucose-Capillary: 225 mg/dL — ABNORMAL HIGH (ref 70–99)

## 2018-10-13 LAB — BASIC METABOLIC PANEL
Anion gap: 6 (ref 5–15)
BUN: 15 mg/dL (ref 6–20)
CO2: 23 mmol/L (ref 22–32)
Calcium: 8.2 mg/dL — ABNORMAL LOW (ref 8.9–10.3)
Chloride: 105 mmol/L (ref 98–111)
Creatinine, Ser: 0.6 mg/dL — ABNORMAL LOW (ref 0.61–1.24)
GFR calc Af Amer: >60 mL/min (ref >60)
GFR calc non Af Amer: >60 mL/min (ref >60)
Glucose, Bld: 414 mg/dL — ABNORMAL HIGH (ref 70–99)
Potassium: 4.5 mmol/L (ref 3.5–5.1)
Sodium: 134 mmol/L — ABNORMAL LOW (ref 135–145)

## 2018-10-13 MED ORDER — INSULIN ASPART 100 UNIT/ML ~~LOC~~ SOLN
5.0000 [IU] | Freq: Three times a day (TID) | SUBCUTANEOUS | Status: DC
  Administered 2018-10-13 – 2018-10-14 (×4): 5 [IU] via SUBCUTANEOUS
  Filled 2018-10-13 (×3): qty 1

## 2018-10-13 MED ORDER — MORPHINE SULFATE (PF) 2 MG/ML IV SOLN
2.0000 mg | INTRAVENOUS | Status: DC | PRN
  Administered 2018-10-13 – 2018-10-15 (×9): 2 mg via INTRAVENOUS
  Filled 2018-10-13 (×9): qty 1

## 2018-10-13 MED ORDER — INSULIN ASPART 100 UNIT/ML ~~LOC~~ SOLN
10.0000 [IU] | Freq: Once | SUBCUTANEOUS | Status: AC
  Administered 2018-10-13: 10 [IU] via SUBCUTANEOUS

## 2018-10-13 MED ORDER — NICOTINE 21 MG/24HR TD PT24
21.0000 mg | MEDICATED_PATCH | Freq: Every day | TRANSDERMAL | Status: DC
  Administered 2018-10-13 – 2018-10-15 (×3): 21 mg via TRANSDERMAL
  Filled 2018-10-13 (×4): qty 1

## 2018-10-13 MED ORDER — SODIUM CHLORIDE 0.9% FLUSH
10.0000 mL | Freq: Once | INTRAVENOUS | Status: AC
  Administered 2018-10-13: 10 mL via INTRAVENOUS

## 2018-10-13 MED ORDER — NICOTINE 21 MG/24HR TD PT24: 21.0000 mg | MEDICATED_PATCH | Freq: Every day | TRANSDERMAL | Status: DC

## 2018-10-13 MED ORDER — LIDOCAINE-EPINEPHRINE 1 %-1:100000 IJ SOLN
20.0000 mL | Freq: Once | INTRAMUSCULAR | Status: AC
  Administered 2018-10-13: 20 mL via INTRADERMAL
  Filled 2018-10-13: qty 20

## 2018-10-13 NOTE — Progress Notes (Signed)
[facility] [place] [facility]   PATIENT NAME: Trevor Brown    MR#:  [id number]  DATE OF BIRTH:  September 19, 1990  SUBJECTIVE:  CHIEF COMPLAINT:   Chief Complaint  Patient presents with  . Abscess  Patient seen today Has swelling and pain in right arm Decreased to pain and swelling in the left hand No complaints of nausea and vomiting  REVIEW OF SYSTEMS:    ROS  CONSTITUTIONAL: No documented fever. No fatigue, weakness. No weight gain, no weight loss.  EYES: No blurry or double vision.  ENT: No tinnitus. No postnasal drip. No redness of the oropharynx.  RESPIRATORY: No cough, no wheeze, no hemoptysis. No dyspnea.  CARDIOVASCULAR: No chest pain. No orthopnea. No palpitations. No syncope.  GASTROINTESTINAL: No nausea, no vomiting or diarrhea. No abdominal pain. No melena or hematochezia.  GENITOURINARY: No dysuria or hematuria.  ENDOCRINE: No polyuria or nocturia. No heat or cold intolerance.  HEMATOLOGY: No anemia. No bruising. No bleeding.  INTEGUMENTARY: No rashes. No lesions.  MUSCULOSKELETAL: No arthritis.  No gout.  Decreased swelling left hand Right arm swelling NEUROLOGIC: No numbness, tingling, or ataxia. No seizure-type activity.  PSYCHIATRIC: No anxiety. No insomnia. No ADD.   DRUG ALLERGIES:   Allergies  Allergen Reactions  . Bee Venom Anaphylaxis  . Penicillins Anaphylaxis, Hives and Other (See Comments)    Has patient had a PCN reaction causing immediate rash, facial/tongue/throat swelling, SOB or lightheadedness with hypotension: Yes Has patient had a PCN reaction causing severe rash involving mucus membranes or skin necrosis: No Has patient had a PCN reaction that required hospitalization No Has patient had a PCN reaction occurring within the last 10 years: Yes If all of the above answers are "NO", then may proceed with Cephalosporin use.  . Ibuprofen Other (See Comments)    Other reaction(s): Other (See Comments) Stomach upset Reaction:   GI upset   . Tramadol Hives and Swelling  . Vancomycin Rash and Other (See Comments)    Reaction:  Red man's syndrome     VITALS:  Blood pressure 134/90, pulse 64, temperature 98 F (36.7 C), temperature source Oral, resp. rate 12, height 5\' 4"  (1.626 m), weight 54.4 kg, SpO2 99 %.  PHYSICAL EXAMINATION:   Physical Exam  GENERAL:  28 y.o.-year-old patient lying in the bed with no acute distress.  EYES: Pupils equal, round, reactive to light and accommodation. No scleral icterus. Extraocular muscles intact.  HEENT: Head atraumatic, normocephalic. Oropharynx and nasopharynx clear.  NECK:  Supple, no jugular venous distention. No thyroid enlargement, no tenderness.  LUNGS: Normal breath sounds bilaterally, no wheezing, rales, rhonchi. No use of accessory muscles of respiration.  CARDIOVASCULAR: S1, S2 normal. No murmurs, rubs, or gallops.  ABDOMEN: Soft, nontender, nondistended. Bowel sounds present. No organomegaly or mass.  EXTREMITIES: No cyanosis, clubbing or edema b/l. Left hand erythema and swelling improved    Right arm swelling noted and tenderness present NEUROLOGIC: Cranial nerves II through XII are intact. No focal Motor or sensory deficits b/l.   PSYCHIATRIC: The patient is alert and oriented x 3.  SKIN: Redness of the skin of the left hand Right arm swelling     LABORATORY PANEL:   CBC Recent Labs  Lab 10/13/18 0449  WBC 9.4  HGB 11.5*  HCT 35.6*  PLT 248   ------------------------------------------------------------------------------------------------------------------ Chemistries  Recent Labs  Lab 10/13/18 0449  NA 134*  K 4.5  CL 105  CO2 23  GLUCOSE 414*  BUN 15  CREATININE 0.60*  CALCIUM 8.2*   ------------------------------------------------------------------------------------------------------------------  Cardiac Enzymes Recent Labs  Lab 10/07/18 1806  TROPONINI <0.03    ------------------------------------------------------------------------------------------------------------------  RADIOLOGY:  [place] Rt Upper Extrem Ltd Soft Tissue Non Vascular  Result Date: 10/13/2018 CLINICAL DATA:  Right upper arm swelling and redness for the past 2 days. Evaluate for abscess. EXAM: ULTRASOUND RIGHT UPPER EXTREMITY LIMITED TECHNIQUE: Ultrasound examination of the upper extremity soft tissues was performed in the area of clinical concern. COMPARISON:  None. FINDINGS: In the area of clinical concern along the right posterior mid upper arm, there is a superficial 3.3 x 1.1 x 3.0 cm complex, hypoechoic fluid collection. IMPRESSION: 1. Superficial 3.3 cm complex fluid collection in the right posterior mid upper arm, suspicious for abscess. Electronically Signed   By: Obie Dredge M.D.   On: 10/13/2018 09:48     ASSESSMENT AND PLAN:  28 year old male patientWith history of type 1 diabetes mellitus, hepatitis C, hypertension Presented to the emergency room for elevated blood sugar and redness and swelling in the left hand.  -Uncontrolled diabetes mellitus IV fluids Control blood sugars with sliding scale coverage with insulin, Insulin 70/30 and lantus insulin on board Meal time insulin added Diabetic coordinator f/u  -Cellulitis and abscess of the hand Has been drained IV antibiotics clindamycin on board  -Abscess of right arm Surgery consult for I&D  -Hyperkalemia improved  -Substance abuse Counseling given to the patient  -Tobacco abuse Tobacco cessation counseled to the patient for 6 minutes Nicotine patch offered  All the records are reviewed and case discussed with Care Management/Social Worker. Management plans discussed with the patient, family and they are in agreement.  CODE STATUS: Full code  DVT Prophylaxis: SCDs  TOTAL TIME TAKING CARE OF THIS PATIENT: 35 minutes.   POSSIBLE D/C IN 2 to 3 DAYS, DEPENDING ON CLINICAL CONDITION.  Ihor Austin  M.D on 10/13/2018 at 11:32 AM  Between 7am to 6pm - Pager - [phone]  After 6pm go to [email or web address] - password [email or web address]  [facility] [place] [facility]  Office  [phone]  CC: Primary care physician; Patient, No Pcp Per  Note: This dictation was prepared with Dragon dictation along with smaller phrase technology. Any transcriptional errors that result from this process are unintentional.

## 2018-10-13 NOTE — Progress Notes (Signed)
Inpatient Diabetes Program Recommendations  AACE/ADA: New Consensus Statement on Inpatient Glycemic Control   Target Ranges:  Prepandial:   less than 140 mg/dL      Peak postprandial:   less than 180 mg/dL (1-2 hours)      Critically ill patients:  140 - 180 mg/dL   Results for Trevor Brown, DIBB (MRN [id number]) as of 10/13/2018 08:34  Ref. Range 10/12/2018 07:59 10/12/2018 11:49 10/12/2018 12:28 10/12/2018 14:32 10/12/2018 16:49 10/12/2018 20:41 10/13/2018 07:41  Glucose-Capillary Latest Ref Range: 70 - 99 mg/dL 753 (H)  Novolog 3 units  70/30 25 units 401 (H)   483 (H)  Novolog 25 units 405 (H) 178 (H)   Novolog 2 units  70/30 10 units 296 (H)  Novolog 5 units 446 (H)  Novolog 10 units  70/30 25 units   Review of Glycemic Control  Diabetes history:DM1 (makes NO insulin; requires basal, correction, and meal coverage insulin) Outpatient Diabetes medications:70/30 25 units QAM, 70/30 28 units QPM, Novolog 5-10 units TID with meals Current orders for Inpatient glycemic control:70/3025units QAM, 70/30 28 units QPM, Novolog 0-9 units AC&HS  Inpatient Diabetes Program Recommendations: Insulin-Basal: Noted patient received reduced dose of 70/30 with supper on 3/23 (received 70/30 10 units) which contributed to fasting glucose of 446 mg/dl today. Patient has already received 70/30 25 units this morning.  Insulin-Meal Coverage: If patient remains inpatient, please consider adding Novolog 5 units TID with meals for meal coverage (in addition to 70/30 BID as ordered).  Outpatient DM plan: At time of discharge, recommend ordering 70/30 25 units QAM with breakfast, 70/30 28 units QPM with supper, Novolog 5 units TID with meals, plus Novolog 0-9 units ACHS for correction.  NOTE: Very difficult for patient to control his glucose at home given his issues with homelessness in the past and his issues with IV Drug Abuse. Poor social support. Per RN notes, patient requesting frequent snacks and  food.  It will be difficult to control CBGs if patient eating constantly. Also, patient reports he only uses his arms for insulin injections. Question if insulin is being absorbed effectively when given in arms since patient is mainly giving all injections there. Will ask NURSING to please only use abdomen to administer insulin while inpatient to see if glucose responds better.  Thanks, Trevor Penner, RN, MSN, CDE Diabetes Coordinator Inpatient Diabetes Program [phone] (Team Pager from 8am to 5pm)

## 2018-10-13 NOTE — Consult Note (Signed)
Subjective:   CC: right arm abscess  HPI:  Trevor Brown is a 28 y.o. male who was consulted by Pyreddy for evaluation of above.  First noted a few days ago.  Symptoms include: Pain is sharp, localized.  Exacerbated by palpation.  Alleviated by nothing specific.  Associated with swelling and redness, but no discharge.  Had abscess drained from his left hand in ED earlier at admission.     Past Medical History:  has a past medical history of Diabetes mellitus without complication (HCC), Heart attack (HCC), Hepatitis C, acute (may 2016), and Hypertension.  Past Surgical History:  has a past surgical history that includes none.  Family History: family history includes Cirrhosis in his mother; Diabetes Mellitus II in his maternal grandfather and maternal grandmother.  Social History:  reports that he has been smoking cigarettes. He has been smoking about 2.00 packs per day. He has never used smokeless tobacco. He reports current drug use. Drug: IV. He reports that he does not drink alcohol.  Current Medications:  Medications Prior to Admission  Medication Sig Dispense Refill  . acetaminophen (TYLENOL) 500 MG tablet Take 1,000 mg by mouth every 6 (six) hours as needed for pain.    Mar[place] Kitchen insulin aspart (NOVOLOG) 100 UNIT/ML injection Inject 5-10 Units into the skin See admin instructions. INJECT BEFORE MEALS PER SLIDING SCALE AS DIRECTED: 120-250 = 5 UNITS;251-350 = 8 UNITS; OVER 350 = 10 UNITS 50 mL 0  . insulin aspart protamine- aspart (NOVOLOG MIX 70/30) (70-30) 100 UNIT/ML injection Inject 0.25 mLs (25 Units total) into the skin daily with breakfast. 50 mL 2  . insulin aspart protamine- aspart (NOVOLOG MIX 70/30) (70-30) 100 UNIT/ML injection Inject 0.28 mLs (28 Units total) into the skin daily with supper. 10 mL 2  . ondansetron (ZOFRAN) 4 MG tablet Take 1 tablet (4 mg total) by mouth every 8 (eight) hours as needed. (Patient taking differently: Take 4 mg by mouth every 8 (eight) hours as needed for  nausea or vomiting. ) 20 tablet 0    Allergies:  Allergies  Allergen Reactions  . Bee Venom Anaphylaxis  . Penicillins Anaphylaxis, Hives and Other (See Comments)    Has patient had a PCN reaction causing immediate rash, facial/tongue/throat swelling, SOB or lightheadedness with hypotension: Yes Has patient had a PCN reaction causing severe rash involving mucus membranes or skin necrosis: No Has patient had a PCN reaction that required hospitalization No Has patient had a PCN reaction occurring within the last 10 years: Yes If all of the above answers are "NO", then may proceed with Cephalosporin use.  . Ibuprofen Other (See Comments)    Other reaction(s): Other (See Comments) Stomach upset Reaction:  GI upset   . Tramadol Hives and Swelling  . Vancomycin Rash and Other (See Comments)    Reaction:  Red man's syndrome     ROS:  General: Denies weight loss, weight gain, fatigue, fevers, chills, and night sweats. Eyes: Denies blurry vision, double vision, eye pain, itchy eyes, and tearing. Ears: Denies hearing loss, earache, and ringing in ears. Nose: Denies sinus pain, congestion, infections, runny nose, and nosebleeds. Mouth/throat: Denies hoarseness, sore throat, bleeding gums, and difficulty swallowing. Heart: Denies chest pain, palpitations, racing heart, irregular heartbeat, leg pain or swelling, and decreased activity tolerance. Respiratory: Denies breathing difficulty, shortness of breath, wheezing, cough, and sputum. GI: Denies change in appetite, heartburn, nausea, vomiting, constipation, diarrhea, and blood in stool. GU: Denies difficulty urinating, pain with urinating, urgency, frequency, blood  in urine. Musculoskeletal: Denies joint stiffness, pain, swelling, muscle weakness. Skin: Denies rash, itching, mass, tumors, sores, and boils Neurologic: Denies headache, fainting, dizziness, seizures, numbness, and tingling. Psychiatric: Denies depression, anxiety, difficulty  sleeping, and memory loss. Endocrine: Denies heat or cold intolerance, and increased thirst or urination. Blood/lymph: Denies easy bruising, easy bruising, and swollen glands     Objective:     BP 134/90 (BP Location: Left Arm)   Pulse 64   Temp 98 F (36.7 C) (Oral)   Resp 12   Ht 5\' 4"  (1.626 m)   Wt 54.4 kg   SpO2 99%   BMI 20.60 kg/m   Constitutional :  alert, cooperative, appears stated age and no distress  Lymphatics/Throat:  no asymmetry, masses, or scars  Respiratory:  clear to auscultation bilaterally  Cardiovascular:  regular rate and rhythm  Gastrointestinal: soft, non-tender; bowel sounds normal; no masses,  no organomegaly.  Musculoskeletal: Steady gait and movement  Skin: Cool and moist, right mid triceps area with induration and slight erythema, tender to touch, consistent with abscess.  Left hand wound with small 1cm incision at base of thumb with scant purulent drainage noted.  Tender to touch.  Psychiatric: Normal affect, non-agitated, not confused       LABS:  CMP Latest Ref Rng & Units 10/13/2018 10/12/2018 10/12/2018  Glucose 70 - 99 mg/dL 803(O) 122(Q) 825(O)  BUN 6 - 20 mg/dL 15 - 16  Creatinine 0.37 - 1.24 mg/dL 0.48(G) - 8.91  Sodium 135 - 145 mmol/L 134(L) - 135  Potassium 3.5 - 5.1 mmol/L 4.5 - 3.8  Chloride 98 - 111 mmol/L 105 - 99  CO2 22 - 32 mmol/L 23 - 20(L)  Calcium 8.9 - 10.3 mg/dL 8.2(L) - 8.0(L)  Total Protein 6.5 - 8.1 g/dL - - -  Total Bilirubin 0.3 - 1.2 mg/dL - - -  Alkaline Phos 38 - 126 U/L - - -  AST 15 - 41 U/L - - -  ALT 0 - 44 U/L - - -   CBC Latest Ref Rng & Units 10/13/2018 10/12/2018 10/11/2018  WBC 4.0 - 10.5 K/uL 9.4 12.2(H) 13.0(H)  Hemoglobin 13.0 - 17.0 g/dL 11.5(L) 10.9(L) 12.0(L)  Hematocrit 39.0 - 52.0 % 35.6(L) 33.7(L) 37.2(L)  Platelets 150 - 400 K/uL 248 239 254    RADS: CLINICAL DATA:  Right upper arm swelling and redness for the past 2 days. Evaluate for abscess.  EXAM: ULTRASOUND RIGHT UPPER EXTREMITY  LIMITED  TECHNIQUE: Ultrasound examination of the upper extremity soft tissues was performed in the area of clinical concern.  COMPARISON:  None.  FINDINGS: In the area of clinical concern along the right posterior mid upper arm, there is a superficial 3.3 x 1.1 x 3.0 cm complex, hypoechoic fluid collection.  IMPRESSION: 1. Superficial 3.3 cm complex fluid collection in the right posterior mid upper arm, suspicious for abscess.   Electronically Signed   By: Obie Dredge M.D.   On: 10/13/2018 09:48  Assessment:      Right arm abscess Left hand abscess Type I uncontrolled DM Plan:     1. Alternatives include continued observation.  Benefits include possible symptom relief.  Discussed the risk of surgery including recurrence, chronic pain, post-op infxn, poor cosmesis, poor/delayed wound healing, and possible re-operation to address said risks.   Typical post-op recovery time of several weeks with continued wound care and possible activity restrictions were also discussed.  The patient verbalized understanding and all questions were answered to the patient's  satisfaction.  Agreeable to proceeding with bedside I&D as noted below.  Preoperative diagnosis: right arm abscess Postoperative diagnosis: same  Procedure: Incision and drainage of right arm abscess  Anesthesia: local  Surgeon: Tonna Boehringer  Wound Classification: Contaminated  Indications: Patient is a 28 y.o. male  presented with above.  See H&P for further details.  Specimen: right upper arm wound culture  Complications: None  Estimated Blood Loss: 3 mL  Findings:  1. purulent secretions drained and cultured 2. Total area roughly 3cm x 3cm x 2cm 3. Adequate hemostasis.   Description of procedure: The patient was placed in the supine position.  The area was prepped and draped in the usual sterile fashion. A timeout was completed verifying correct patient, procedure, site, positioning, and implant(s)  and/or special equipment prior to beginning this procedure.  Local infused over planned incision site.  Inicision made and purulent secretions was drained immediately. Cultures taken.  With a hemostat blunt dissection of septas performed to drain the abscess completely.  Total cavity measurements as noted above. The wound then irrigated, hemostasis confirmed and then packed with an iodine packing, dressed with 4x4 and secured with paper tape.  The patient tolerated the procedure well.  Left hand packing removed and wound examined, then repacked with 1in iodoform dressing again.  Recommend removing packing at both sites in 48hrs, then continue daily wet to dry dressing changes as discussed in clinic.  Take tip of 4x4 and use qtip to gently pack the wound as tolerated.  Remove before showering and allow soap and water to run over it.  Pat dry and then repack daily.

## 2018-10-13 NOTE — Progress Notes (Signed)
Pt blood glucose was 446mg /dL. Dr Tobi Bastos paged and verbal order to hold sliding scale and give one time order of 10units of novoLOG with scheduled dose of novoLOG mix 70/30 25units.

## 2018-10-13 NOTE — Discharge Instructions (Signed)
.  Recommend removing packing at both sites on 10/15/2018, then continue daily wet to dry dressing changes as discussed in clinic.  Take tip of 4x4 and use qtip to gently pack the wound as tolerated.  Remove before showering and allow soap and water to run over it.  Pat dry and then repack daily.

## 2018-10-14 LAB — GLUCOSE, CAPILLARY
Glucose-Capillary: 409 mg/dL — ABNORMAL HIGH (ref 70–99)
Glucose-Capillary: 350 mg/dL — ABNORMAL HIGH (ref 70–99)
Glucose-Capillary: 49 mg/dL — ABNORMAL LOW (ref 70–99)
Glucose-Capillary: 82 mg/dL (ref 70–99)
Glucose-Capillary: 470 mg/dL — ABNORMAL HIGH (ref 70–99)

## 2018-10-14 MED ORDER — INSULIN ASPART 100 UNIT/ML ~~LOC~~ SOLN
0.0000 [IU] | Freq: Three times a day (TID) | SUBCUTANEOUS | Status: DC
  Administered 2018-10-14: 15 [IU] via SUBCUTANEOUS
  Administered 2018-10-15: 7 [IU] via SUBCUTANEOUS
  Administered 2018-10-15: 20 [IU] via SUBCUTANEOUS
  Filled 2018-10-14 (×3): qty 1

## 2018-10-14 MED ORDER — INSULIN ASPART 100 UNIT/ML ~~LOC~~ SOLN
10.0000 [IU] | Freq: Three times a day (TID) | SUBCUTANEOUS | Status: DC
  Administered 2018-10-14 – 2018-10-15 (×2): 10 [IU] via SUBCUTANEOUS
  Filled 2018-10-14 (×2): qty 1

## 2018-10-14 MED ORDER — INSULIN ASPART 100 UNIT/ML ~~LOC~~ SOLN: 0.0000 [IU] | Freq: Every day | SUBCUTANEOUS | Status: DC

## 2018-10-14 MED ORDER — MUPIROCIN 2 % EX OINT
TOPICAL_OINTMENT | Freq: Two times a day (BID) | CUTANEOUS | Status: DC
  Administered 2018-10-14 – 2018-10-15 (×2): via NASAL
  Filled 2018-10-14: qty 22

## 2018-10-14 MED ORDER — INSULIN ASPART 100 UNIT/ML ~~LOC~~ SOLN
20.0000 [IU] | Freq: Once | SUBCUTANEOUS | Status: AC
  Administered 2018-10-14: 20 [IU] via SUBCUTANEOUS
  Filled 2018-10-14: qty 1

## 2018-10-14 NOTE — Progress Notes (Signed)
Hypoglycemic Event  CBG: 49  Treatment: 8oz orange juice  Symptoms:Asymptomatic  Follow-up CBG:  Time:1659 CBG Result:89  Possible Reasons for Event: unknown  Comments/MD notified: Dr Dahlia Bailiff

## 2018-10-14 NOTE — Progress Notes (Signed)
Inpatient Diabetes Program Recommendations  AACE/ADA: New Consensus Statement on Inpatient Glycemic Control   Target Ranges:  Prepandial:   less than 140 mg/dL      Peak postprandial:   less than 180 mg/dL (1-2 hours)      Critically ill patients:  140 - 180 mg/dL   Results for GARO, TAMBLYN (MRN [id number]) as of 10/14/2018 10:23  Ref. Range 10/13/2018 07:41 10/13/2018 11:42 10/13/2018 16:40 10/13/2018 21:21 10/14/2018 08:04  Glucose-Capillary Latest Ref Range: 70 - 99 mg/dL 093 (H) 112 (H) 91 162 (H) 409 (H)   Review of Glycemic Control Diabetes history:DM1 (makes NO insulin; requires basal, correction, and meal coverage insulin) Outpatient Diabetes medications:70/30 25 units QAM, 70/30 28 units QPM, Novolog 5-10 units TID with meals Current orders for Inpatient glycemic control:70/3025units QAM, 70/3028units QPM, Novolog 0-20 units TID with meals, Novolog 0-5 units QHS, Novolog 10 units TID with meals  Inpatient Diabetes Program Recommendations:   Correction (SSI): Noted Novolog correction increased to Resistant scale today.  Insulin - Meal Coverage: Noted that meal coverage increased today to Novolog 5 units TID to 10 units TID with meals.  Insulin-Basal: If glucose does not improve with insulin changes made this morning, would recommend increasing 70/30 to 30 units QAM and 70/30 32 units QPM.  NOTE: Very difficult for patient to control his glucose at home given his issues with homelessness in the past and his issues with IV Drug Abuse. Poor social support. Per RN notes, patient requesting frequent snacks and food. It will be difficult to control CBGs if patient is eating constantly. Also, patient reports he only uses his arms for insulin injections. Question if insulin is being absorbed effectively when given in arms since patient is mainly giving all injections there. Will ask NURSING to pleaseonly use abdomen to administer insulinwhile inpatient.    Thanks, Orlando Penner, RN,  MSN, CDE Diabetes Coordinator Inpatient Diabetes Program [phone] (Team Pager from 8am to 5pm)

## 2018-10-14 NOTE — Progress Notes (Signed)
Pt's blood glucose was 409mg /dL. Dr Cherlynn Kaiser contacted and verbal order to continue with current sliding scale and scheduled insulin as ordered given. Pt informed.

## 2018-10-14 NOTE — Progress Notes (Addendum)
[facility] [place] at [facility]     PATIENT NAME: Trevor Brown    MR#:  [id number]  DATE OF BIRTH:  [birth year]  SUBJECTIVE:   Pt here due to uncontrolled DM and Cellulitis/abscess of Right hand/arm.  Feels much better today.    S/p bedside I & D of the right arm abscess today.   REVIEW OF SYSTEMS:    Review of Systems  Constitutional: Negative for chills and fever.  HENT: Negative for congestion and tinnitus.   Eyes: Negative for blurred vision and double vision.  Respiratory: Negative for cough, shortness of breath and wheezing.   Cardiovascular: Negative for chest pain, orthopnea and PND.  Gastrointestinal: Negative for abdominal pain, diarrhea, nausea and vomiting.  Genitourinary: Negative for dysuria and hematuria.  Neurological: Negative for dizziness, sensory change and focal weakness.  All other systems reviewed and are negative.   Nutrition: Carb control Tolerating Diet: Yes Tolerating PT: Ambulatory.  DRUG ALLERGIES:   Allergies  Allergen Reactions   Bee Venom Anaphylaxis   Penicillins Anaphylaxis, Hives and Other (See Comments)    Has patient had a PCN reaction causing immediate rash, facial/tongue/throat swelling, SOB or lightheadedness with hypotension: Yes Has patient had a PCN reaction causing severe rash involving mucus membranes or skin necrosis: No Has patient had a PCN reaction that required hospitalization No Has patient had a PCN reaction occurring within the last 10 years: Yes If all of the above answers are "NO", then may proceed with Cephalosporin use.   Ibuprofen Other (See Comments)    Other reaction(s): Other (See Comments) Stomach upset Reaction:  GI upset    Tramadol Hives and Swelling   Vancomycin Rash and Other (See Comments)    Reaction:  Red man's syndrome     VITALS:  Blood pressure 128/90, pulse 69, temperature 98.1 F (36.7 C), temperature source Oral, resp. rate 14, height 5\' 4"  (1.626 m), weight 54.4  kg, SpO2 99 %.  PHYSICAL EXAMINATION:   Physical Exam  GENERAL:  28 y.o.-year-old patient lying in bed in no acute distress.  EYES: Pupils equal, round, reactive to light and accommodation. No scleral icterus. Extraocular muscles intact.  HEENT: Head atraumatic, normocephalic. Oropharynx and nasopharynx clear.  NECK:  Supple, no jugular venous distention. No thyroid enlargement, no tenderness.  LUNGS: Normal breath sounds bilaterally, no wheezing, rales, rhonchi. No use of accessory muscles of respiration.  CARDIOVASCULAR: S1, S2 normal. No murmurs, rubs, or gallops.  ABDOMEN: Soft, nontender, nondistended. Bowel sounds present. No organomegaly or mass.  EXTREMITIES: No cyanosis, clubbing or edema b/l.    NEUROLOGIC: Cranial nerves II through XII are intact. No focal Motor or sensory deficits b/l.   PSYCHIATRIC: The patient is alert and oriented x 3.  SKIN: No obvious rash, lesion. Left wrist abscess status post I&D.  Right upper extremity abscess status post I&D with packing in place.  No induration around.    LABORATORY PANEL:   CBC Recent Labs  Lab 10/13/18 0449  WBC 9.4  HGB 11.5*  HCT 35.6*  PLT 248   ------------------------------------------------------------------------------------------------------------------  Chemistries  Recent Labs  Lab 10/13/18 0449  NA 134*  K 4.5  CL 105  CO2 23  GLUCOSE 414*  BUN 15  CREATININE 0.60*  CALCIUM 8.2*   ------------------------------------------------------------------------------------------------------------------  Cardiac Enzymes Recent Labs  Lab 10/07/18 1806  TROPONINI <0.03   ------------------------------------------------------------------------------------------------------------------  RADIOLOGY:  [place] Rt Upper Extrem Ltd Soft Tissue Non Vascular  Result Date: 10/13/2018 CLINICAL DATA:  Right upper arm  swelling and redness for the past 2 days. Evaluate for abscess. EXAM: ULTRASOUND RIGHT UPPER EXTREMITY  LIMITED TECHNIQUE: Ultrasound examination of the upper extremity soft tissues was performed in the area of clinical concern. COMPARISON:  None. FINDINGS: In the area of clinical concern along the right posterior mid upper arm, there is a superficial 3.3 x 1.1 x 3.0 cm complex, hypoechoic fluid collection. IMPRESSION: 1. Superficial 3.3 cm complex fluid collection in the right posterior mid upper arm, suspicious for abscess. Electronically Signed   By: Trevor Brown M.D.   On: 10/13/2018 09:48     ASSESSMENT AND PLAN:   28 year old male patientWith history of type 1 diabetes mellitus, hepatitis C, hypertension Presented to the emergency room for elevated blood sugar and redness and swelling in the left hand.  1. Uncontrolled diabetes mellitus-improved since admission.  Blood sugars are still somewhat labile. -Appreciate diabetes coordinator input.  We will continue insulin 70/30, increase NovoLog with meals, increase the sliding scale coverage and follow blood sugars for now. -Continue carb controlled diet.  2. Cellulitis and abscess of the left hand and Right arm - s/p I & D today of right Arm abscess at bedside by Dr. Tonna Brown.  -Continue IV clindamycin, follow wound cultures.  Appreciate surgical input.  3. Hyperkalemia improved  4. Tobacco abuse - cont. Nicotine patch.    All the records are reviewed and case discussed with Care Management/Social Worker. Management plans discussed with the patient, family and they are in agreement.  CODE STATUS: Full code  DVT Prophylaxis: Lovenox  TOTAL TIME TAKING CARE OF THIS PATIENT: 30 minutes.   POSSIBLE D/C IN 1-2 DAYS, DEPENDING ON CLINICAL CONDITION.   Trevor Brown M.D on 10/14/2018 at 12:22 PM  Between 7am to 6pm - Pager - [phone]  After 6pm go to [email or web address] - Social research officer, government  [facility]  Office  [phone]  CC: Primary care physician; Patient, No Pcp Per

## 2018-10-14 NOTE — Progress Notes (Signed)
Patient blood sugar 470. MD notified as instructed. MD orders pending.

## 2018-10-15 LAB — GLUCOSE, RANDOM: Glucose, Bld: 541 mg/dL (ref 70–99)

## 2018-10-15 LAB — GLUCOSE, CAPILLARY
Glucose-Capillary: 568 mg/dL (ref 70–99)
Glucose-Capillary: 549 mg/dL (ref 70–99)
Glucose-Capillary: 245 mg/dL — ABNORMAL HIGH (ref 70–99)

## 2018-10-15 MED ORDER — INSULIN ASPART PROT & ASPART (70-30 MIX) 100 UNIT/ML ~~LOC~~ SUSP: 30.0000 [IU] | Freq: Every day | SUBCUTANEOUS | 2 refills | Status: DC

## 2018-10-15 MED ORDER — INSULIN ASPART PROT & ASPART (70-30 MIX) 100 UNIT/ML ~~LOC~~ SUSP: 32.0000 [IU] | Freq: Every day | SUBCUTANEOUS | 2 refills | Status: DC

## 2018-10-15 MED ORDER — INSULIN ASPART 100 UNIT/ML ~~LOC~~ SOLN
8.0000 [IU] | Freq: Three times a day (TID) | SUBCUTANEOUS | Status: DC
  Administered 2018-10-15: 8 [IU] via SUBCUTANEOUS
  Filled 2018-10-15: qty 1

## 2018-10-15 MED ORDER — INSULIN ASPART PROT & ASPART (70-30 MIX) 100 UNIT/ML ~~LOC~~ SUSP: 32.0000 [IU] | Freq: Every day | SUBCUTANEOUS | Status: DC

## 2018-10-15 MED ORDER — INSULIN ASPART PROT & ASPART (70-30 MIX) 100 UNIT/ML ~~LOC~~ SUSP: 30.0000 [IU] | Freq: Every day | SUBCUTANEOUS | Status: DC

## 2018-10-15 MED ORDER — CLINDAMYCIN HCL 300 MG PO CAPS: 300.0000 mg | ORAL_CAPSULE | Freq: Three times a day (TID) | ORAL | 0 refills | Status: AC

## 2018-10-15 NOTE — Progress Notes (Addendum)
CBG 568. MD notified. Will give SSI and recheck.  Suzan Slick, RN

## 2018-10-15 NOTE — Progress Notes (Signed)
Inpatient Diabetes Program Recommendations  AACE/ADA: New Consensus Statement on Inpatient Glycemic Control  Target Ranges:  Prepandial:   less than 140 mg/dL      Peak postprandial:   less than 180 mg/dL (1-2 hours)      Critically ill patients:  140 - 180 mg/dL  Results for PAYTON, NEWFIELD (MRN [id number]) as of 10/15/2018 08:19  Ref. Range 10/14/2018 08:04 10/14/2018 11:38 10/14/2018 16:36 10/14/2018 16:59 10/14/2018 21:14 10/15/2018 07:40  Glucose-Capillary Latest Ref Range: 70 - 99 mg/dL 443 (H) 154 (H) 49 (L) 82 470 (H) 568 (HH)    Review of Glycemic Control  Diabetes history: DM1 (makes NO insulin; requires basal, correction, and meal coverage insulin) Outpatient Diabetes medications:70/3025units QAM, 70/30 28units QPM, Novolog 5-10 units TID with meals Current orders for Inpatient glycemic control:70/3025units QAM, 70/3028units QPM, Novolog 0-20units TID with meals, Novolog 0-5 units QHS, Novolog 10 units TID with meals  Inpatient Diabetes Program Recommendations:   Insulin-Basal: Noted patient did NOT receive evening dose of 70/30 yesterday at 17:00 which is why glucose is 568 mg/dl this morning. Please consider increasing 70/30 to 30 units QAM and 70/30 32 units QPM.  Correction (SSI): Please consider decreasing Novolog correction to sensitive scale (0-9 units TID).  Insulin-Meal Coverage: Please consider decreasing meal coverage to Novolog 8 units TID with meals.  Thanks, Orlando Penner, RN, MSN, CDE Diabetes Coordinator Inpatient Diabetes Program [phone] (Team Pager from 8am to 5pm)

## 2018-10-15 NOTE — TOC Transition Note (Signed)
Transition of Care [facility]) - CM/SW Discharge Note   Patient Details  Name: Trevor Brown MRN: [id number] Date of Birth: [birth year]  Transition of Care [facility]) CM/SW Contact:  Barrie Dunker, RN Phone Number: 10/15/2018, 11:23 AM   Clinical Narrative:    Patient to DC with a bus pass, provided with supplies to change dressing, already set up with Medication Mgt to get medication, has resources for shelter and food,    Final next level of care: Home/Self Care Barriers to Discharge: Barriers Resolved   Patient Goals and CMS Choice Patient states their goals for this hospitalization and ongoing recovery are:: get better      Discharge Placement                       Discharge Plan and Services                          Social Determinants of Health (SDOH) Interventions     Readmission Risk Interventions No flowsheet data found.

## 2018-10-15 NOTE — Progress Notes (Signed)
DISCHARGE NOTE:  Patient is alert and oriented. Printed AVS and scripts for ABT and insulin given to patient in discharge packet. PIV removed. VSS. Patient was educated on dressing changes and given supplies to manage these at discharge. Bus pass provided to patient. No concerns voiced at this time.   Suzan Slick, RN

## 2018-10-15 NOTE — Discharge Summary (Signed)
[facility] [place] at [facility]   PATIENT NAME: Trevor Brown    MR#:  [id number]  DATE OF BIRTH:  05-13-1991  DATE OF ADMISSION:  10/11/2018 ADMITTING PHYSICIAN: Oralia Manis, MD  DATE OF DISCHARGE: 10/15/2018  PRIMARY CARE PHYSICIAN: Patient, No Pcp Per    ADMISSION DIAGNOSIS:  Abscess [L02.91] Hyperglycemia [R73.9]  DISCHARGE DIAGNOSIS:  Principal Problem:   Type 1 diabetes mellitus with hyperglycemia (HCC) Active Problems:   Cellulitis and abscess of hand   IVDU (intravenous drug user)   Hyperkalemia   Hand abscess   SECONDARY DIAGNOSIS:   Past Medical History:  Diagnosis Date  . Diabetes mellitus without complication (HCC)   . Heart attack (HCC)    Pt claims he had heart attack a year ago ( 2015)- and was admitted in [facility] for that, but not given any meds or angiogram, on review of chart- I could not find any details like that.  . Hepatitis C, acute may 2016  . Hypertension     HOSPITAL COURSE:   28 year old male patientWith history of type 1 diabetes mellitus, hepatitis C, hypertension Presented to the emergency room for elevated blood sugar and redness and swelling in the left hand.  1. Uncontrolled diabetes mellitus- this was secondary to noncompliance, and diabetes coordinator consult obtained.  Patient's insulin was adjusted.  His blood sugars since that have improved.  He does have some labile blood sugars but they are stable overall. -Patient was advised to follow a carb controlled diet.  His Novolin 70/30 has been adjusted prior to discharge which he will continue along with his NovoLog sliding scale. -Patient is being discharged home.  2. Cellulitis and abscess of the left hand and Right arm -patient underwent incision and drainage of the left hand abscess in the ER and was then seen by general surgery underwent I&D at bedside of the right arm abscess.  Postoperatively the area has less induration and swelling.  Patient was empirically  given IV clindamycin and now being discharged on oral kanamycin for additional week. - He is to change his dressings daily wet-to-dry and to follow-up with surgery within the next week.  3. Hyperkalemia-improved and resolved  4. Tobacco abuse -while in the hospital patient was on nicotine patch   DISCHARGE CONDITIONS:   Stable.   CONSULTS OBTAINED:  Treatment Team:  Sung Amabile, DO  DRUG ALLERGIES:   Allergies  Allergen Reactions  . Bee Venom Anaphylaxis  . Penicillins Anaphylaxis, Hives and Other (See Comments)    Has patient had a PCN reaction causing immediate rash, facial/tongue/throat swelling, SOB or lightheadedness with hypotension: Yes Has patient had a PCN reaction causing severe rash involving mucus membranes or skin necrosis: No Has patient had a PCN reaction that required hospitalization No Has patient had a PCN reaction occurring within the last 10 years: Yes If all of the above answers are "NO", then may proceed with Cephalosporin use.  . Ibuprofen Other (See Comments)    Other reaction(s): Other (See Comments) Stomach upset Reaction:  GI upset   . Tramadol Hives and Swelling  . Vancomycin Rash and Other (See Comments)    Reaction:  Red man's syndrome     DISCHARGE MEDICATIONS:   Allergies as of 10/15/2018      Reactions   Bee Venom Anaphylaxis   Penicillins Anaphylaxis, Hives, Other (See Comments)   Has patient had a PCN reaction causing immediate rash, facial/tongue/throat swelling, SOB or lightheadedness with hypotension: Yes Has patient had a PCN  reaction causing severe rash involving mucus membranes or skin necrosis: No Has patient had a PCN reaction that required hospitalization No Has patient had a PCN reaction occurring within the last 10 years: Yes If all of the above answers are "NO", then may proceed with Cephalosporin use.   Ibuprofen Other (See Comments)   Other reaction(s): Other (See Comments) Stomach upset Reaction:  GI upset     Tramadol Hives, Swelling   Vancomycin Rash, Other (See Comments)   Reaction:  Red man's syndrome       Medication List    TAKE these medications   acetaminophen 500 MG tablet Commonly known as:  TYLENOL Take 1,000 mg by mouth every 6 (six) hours as needed for pain.   clindamycin 300 MG capsule Commonly known as:  CLEOCIN Take 1 capsule (300 mg total) by mouth 3 (three) times daily for 7 days.   insulin aspart 100 UNIT/ML injection Commonly known as:  NovoLOG Inject 5-10 Units into the skin See admin instructions. INJECT BEFORE MEALS PER SLIDING SCALE AS DIRECTED: 120-250 = 5 UNITS;251-350 = 8 UNITS; OVER 350 = 10 UNITS   insulin aspart protamine- aspart (70-30) 100 UNIT/ML injection Commonly known as:  NOVOLOG MIX 70/30 Inject 0.3 mLs (30 Units total) into the skin daily with breakfast. What changed:  how much to take   insulin aspart protamine- aspart (70-30) 100 UNIT/ML injection Commonly known as:  NOVOLOG MIX 70/30 Inject 0.32 mLs (32 Units total) into the skin daily with supper. What changed:  how much to take   ondansetron 4 MG tablet Commonly known as:  Zofran Take 1 tablet (4 mg total) by mouth every 8 (eight) hours as needed. What changed:  reasons to take this         DISCHARGE INSTRUCTIONS:   DIET:  Diabetic diet  DISCHARGE CONDITION:  Stable  ACTIVITY:  Activity as tolerated  OXYGEN:  Home Oxygen: No.   Oxygen Delivery: room air  DISCHARGE LOCATION:  home   If you experience worsening of your admission symptoms, develop shortness of breath, life threatening emergency, suicidal or homicidal thoughts you must seek medical attention immediately by calling 911 or calling your MD immediately  if symptoms less severe.  You Must read complete instructions/literature along with all the possible adverse reactions/side effects for all the Medicines you take and that have been prescribed to you. Take any new Medicines after you have completely understood  and accpet all the possible adverse reactions/side effects.   Please note  You were cared for by a hospitalist during your hospital stay. If you have any questions about your discharge medications or the care you received while you were in the hospital after you are discharged, you can call the unit and asked to speak with the hospitalist on call if the hospitalist that took care of you is not available. Once you are discharged, your primary care physician will handle any further medical issues. Please note that NO REFILLS for any discharge medications will be authorized once you are discharged, as it is imperative that you return to your primary care physician (or establish a relationship with a primary care physician if you do not have one) for your aftercare needs so that they can reassess your need for medications and monitor your lab values.     Today   Afebrile, hemodynamically stable.  Blood sugars are still somewhat labile but it is because patient ate too many snacks overnight.  Will discharge home today.  VITAL  SIGNS:  Blood pressure 132/85, pulse 70, temperature 98 F (36.7 C), temperature source Oral, resp. rate 20, height 5\' 4"  (1.626 m), weight 54.4 kg, SpO2 99 %.  I/O:    Intake/Output Summary (Last 24 hours) at 10/15/2018 1352 Last data filed at 10/15/2018 1329 Gross per 24 hour  Intake 840 ml  Output -  Net 840 ml    PHYSICAL EXAMINATION:  GENERAL:  28 y.o.-year-old patient lying in the bed with no acute distress.  EYES: Pupils equal, round, reactive to light and accommodation. No scleral icterus. Extraocular muscles intact.  HEENT: Head atraumatic, normocephalic. Oropharynx and nasopharynx clear.  NECK:  Supple, no jugular venous distention. No thyroid enlargement, no tenderness.  LUNGS: Normal breath sounds bilaterally, no wheezing, rales,rhonchi. No use of accessory muscles of respiration.  CARDIOVASCULAR: S1, S2 normal. No murmurs, rubs, or gallops.  ABDOMEN:  Soft, non-tender, non-distended. Bowel sounds present. No organomegaly or mass.  EXTREMITIES: No pedal edema, cyanosis, or clubbing.  NEUROLOGIC: Cranial nerves II through XII are intact. No focal motor or sensory defecits b/l.  PSYCHIATRIC: The patient is alert and oriented x 3. Good affect.  SKIN: No obvious rash, lesion, or ulcer.  Dressing to the left hand/wrist area of from cellulitis and abscess and also dressing to the right shoulder/arm after incision and drainage.  DATA REVIEW:   CBC Recent Labs  Lab 10/13/18 0449  WBC 9.4  HGB 11.5*  HCT 35.6*  PLT 248    Chemistries  Recent Labs  Lab 10/13/18 0449 10/15/18 0808  NA 134*  --   K 4.5  --   CL 105  --   CO2 23  --   GLUCOSE 414* 541*  BUN 15  --   CREATININE 0.60*  --   CALCIUM 8.2*  --     Cardiac Enzymes No results for input(s): TROPONINI in the last 168 hours.  Microbiology Results  Results for orders placed or performed during the hospital encounter of 10/11/18  Aerobic/Anaerobic Culture (surgical/deep wound)     Status: None (Preliminary result)   Collection Time: 10/13/18  2:22 PM  Result Value Ref Range Status   Specimen Description   Final    ARM Performed at [facility], [address], [place], [place] [address]    Special Requests   Final    Normal Performed at [facility], [address] [place], [place] [address]    Gram Stain   Final    NO WBC SEEN MODERATE GRAM POSITIVE COCCI Performed at [facility], [address], [place], [place] [address]    Culture   Final    ABUNDANT VIRIDANS STREPTOCOCCUS FEW STAPHYLOCOCCUS AUREUS FEW KLEBSIELLA OXYTOCA    Report Status PENDING  Incomplete    RADIOLOGY:  No results found.    Management plans discussed with the patient, family and they are in agreement.  CODE STATUS:     Code Status Orders  (From admission, onward)         Start     Ordered   10/11/18 2248  Full code  Continuous     10/11/18 2247        TOTAL TIME TAKING CARE OF THIS PATIENT: 40 minutes.    Houston Siren M.D on 10/15/2018 at 1:52 PM  Between 7am to 6pm - Pager - [phone]  After 6pm go to [email or web address] - Social research officer, government  [employer]  Office  [phone]  CC: Primary care physician; Patient,  No Pcp Per

## 2018-10-18 LAB — AEROBIC/ANAEROBIC CULTURE W GRAM STAIN (SURGICAL/DEEP WOUND)
Gram Stain: NONE SEEN
Special Requests: NORMAL

## 2018-12-10 ENCOUNTER — Ambulatory Visit: Payer: Self-pay

## 2018-12-31 ENCOUNTER — Ambulatory Visit: Payer: Medicaid Other | Admitting: Adult Health Nurse Practitioner

## 2019-02-19 ENCOUNTER — Other Ambulatory Visit: Payer: Self-pay

## 2019-02-19 ENCOUNTER — Emergency Department
Admission: EM | Admit: 2019-02-19 | Discharge: 2019-02-19 | Disposition: A | Discharge status: Home / Self Care | Payer: Self-pay | Attending: Emergency Medicine | Admitting: Emergency Medicine

## 2019-02-19 DIAGNOSIS — Z794 Long term (current) use of insulin: Secondary | ICD-10-CM | POA: Insufficient documentation

## 2019-02-19 DIAGNOSIS — E109 Type 1 diabetes mellitus without complications: Secondary | ICD-10-CM | POA: Insufficient documentation

## 2019-02-19 DIAGNOSIS — W268XXA Contact with other sharp object(s), not elsewhere classified, initial encounter: Secondary | ICD-10-CM | POA: Insufficient documentation

## 2019-02-19 DIAGNOSIS — I10 Essential (primary) hypertension: Secondary | ICD-10-CM | POA: Insufficient documentation

## 2019-02-19 DIAGNOSIS — Z79899 Other long term (current) drug therapy: Secondary | ICD-10-CM | POA: Insufficient documentation

## 2019-02-19 DIAGNOSIS — Y929 Unspecified place or not applicable: Secondary | ICD-10-CM | POA: Insufficient documentation

## 2019-02-19 DIAGNOSIS — Y999 Unspecified external cause status: Secondary | ICD-10-CM | POA: Insufficient documentation

## 2019-02-19 DIAGNOSIS — F17210 Nicotine dependence, cigarettes, uncomplicated: Secondary | ICD-10-CM | POA: Insufficient documentation

## 2019-02-19 DIAGNOSIS — Y9389 Activity, other specified: Secondary | ICD-10-CM | POA: Insufficient documentation

## 2019-02-19 DIAGNOSIS — S61011A Laceration without foreign body of right thumb without damage to nail, initial encounter: Secondary | ICD-10-CM | POA: Insufficient documentation

## 2019-02-19 MED ORDER — LIDOCAINE HCL (PF) 1 % IJ SOLN
INTRAMUSCULAR | Status: AC
  Filled 2019-02-19: qty 5

## 2019-02-19 MED ORDER — LIDOCAINE HCL (PF) 1 % IJ SOLN: 5.0000 mL | Freq: Once | INTRAMUSCULAR | Status: DC

## 2019-02-19 NOTE — ED Provider Notes (Signed)
[facility] Emergency Department Provider Note   ____________________________________________   First MD Initiated Contact with Patient 02/19/19 1107     (approximate)  I have reviewed the triage vital signs and the nursing notes.   HISTORY  Chief Complaint Laceration    HPI JOSHUAL TERRIO is a 28 y.o. male patient presents with laceration to the right thumb cutting carpet.  Patient denies loss sensation loss of function.  Patient bleeding controlled with direct pressure.  Patient states tetanus shot is up-to-date.  Patient rates pain as a 9/10.  Patient described pain is "aching".  No other palliative measures for complaint.`         Past Medical History:  Diagnosis Date  . Diabetes mellitus without complication ([place])   . Heart attack ([place])    Pt claims he had heart attack a year ago ( 2015)- and was admitted in [facility] for that, but not given any meds or angiogram, on review of chart- I could not find any details like that.  . Hepatitis C, acute may 2016  . Hypertension     Patient Active Problem List   Diagnosis Date Noted  . Hand abscess 10/13/2018  . IVDU (intravenous drug user) 10/11/2018  . Hyperkalemia 10/11/2018  . Cellulitis and abscess of hand 08/17/2018  . Abdominal pain 06/29/2017  . Hyponatremia 06/29/2017  . DKA (diabetic ketoacidosis) ([place]) 06/29/2017  . Dental erosion extending into pulp 12/31/2016  . 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 Elevated transaminase level 03/27/2016  . Diabetes mellitus type 1 ([place]) 03/27/2016  . Tobacco abuse counseling 03/27/2016  . Transaminitis 03/25/2016  . Tobacco abuse 01/10/2016  . Type 1 diabetes mellitus with hyperglycemia ([place]) 12/12/2015  . Cocaine abuse ([place]) 12/12/2015  . DKA (diabetic ketoacidoses) ([place]) 06/10/2015  . Hidradenitis suppurativa of left axilla   . DKA, type 1 ([place]) 06/06/2015  . Malnutrition of moderate degree ([place]) 04/08/2015  . Hepatitis C 12/13/2014  . Diabetes type 1,  uncontrolled ([place]) 12/11/2014  . Major depressive disorder, single episode, mild ([place])   . Major depression, single episode 12/10/2014  . Diabetes mellitus type 1, uncontrolled ([place]) 12/09/2014  . Homelessness 12/09/2014    Past Surgical History:  Procedure Laterality Date  . none      Prior to Admission medications   Medication Sig Start Date End Date Taking? Authorizing Provider  acetaminophen (TYLENOL) 500 MG tablet Take 1,000 mg by mouth every 6 (six) hours as needed for pain. 06/17/18 06/17/19  [provider]  insulin aspart (NOVOLOG) 100 UNIT/ML injection Inject 5-10 Units into the skin See admin instructions. INJECT BEFORE MEALS PER SLIDING SCALE AS DIRECTED: 120-250 = 5 UNITS;251-350 = 8 UNITS; OVER 350 = 10 UNITS 08/21/18   Demetrios Loll, MD  insulin aspart protamine- aspart (NOVOLOG MIX 70/30) (70-30) 100 UNIT/ML injection Inject 0.3 mLs (30 Units total) into the skin daily with breakfast. 10/15/18   Henreitta Leber, MD  insulin aspart protamine- aspart (NOVOLOG MIX 70/30) (70-30) 100 UNIT/ML injection Inject 0.32 mLs (32 Units total) into the skin daily with supper. 10/15/18   Henreitta Leber, MD  ondansetron (ZOFRAN) 4 MG tablet Take 1 tablet (4 mg total) by mouth every 8 (eight) hours as needed. Patient taking differently: Take 4 mg by mouth every 8 (eight) hours as needed for nausea or vomiting.  10/07/18   Nance Pear, MD    Allergies Bee venom, Penicillins, Ibuprofen, Tramadol, and Vancomycin  Family History  Problem Relation Age of Onset  .  Cirrhosis Mother   . Diabetes Mellitus II Maternal Grandmother   . Diabetes Mellitus II Maternal Grandfather     Social History Social History   Tobacco Use  . Smoking status: Current Every Day Smoker    Packs/day: 2.00    Types: Cigarettes  . Smokeless tobacco: Never Used  Substance Use Topics  . Alcohol use: No    Alcohol/week: 0.0 standard drinks  . Drug use: Yes    Types: IV    Comment: heroin- last used  October 04 2018    Review of Systems Constitutional: No fever/chills Eyes: No visual changes. ENT: No sore throat. Cardiovascular: Denies chest pain. Respiratory: Denies shortness of breath. Gastrointestinal: No abdominal pain.  No nausea, no vomiting.  No diarrhea.  No constipation. Genitourinary: Negative for dysuria. Musculoskeletal: Negative for back pain. Skin: Negative for rash.  Right thumb laceration Neurological: Negative for headaches, focal weakness or numbness. Endocrine:  Diabetes, hepatitis C, and hypertension.  Allergic/Immunilogical: See allergy list. ____________________________________________   PHYSICAL EXAM:  VITAL SIGNS: ED Triage Vitals  Enc Vitals Group     BP 02/19/19 1038 125/75     Pulse Rate 02/19/19 1038 84     Resp 02/19/19 1038 18     Temp 02/19/19 1038 98.2 F (36.8 C)     Temp Source 02/19/19 1038 Oral     SpO2 02/19/19 1038 99 %     Weight --      Height --      Head Circumference --      Peak Flow --      Pain Score 02/19/19 1039 9     Pain Loc --      Pain Edu? --      Excl. in GC? --     Constitutional: Alert and oriented. Well appearing and in no acute distress. Cardiovascular: Normal rate, regular rhythm. Grossly normal heart sounds.  Good peripheral circulation. Respiratory: Normal respiratory effort.  No retractions. Lungs CTAB. Neurologic:  Normal speech and language. No gross focal neurologic deficits are appreciated. No gait instability. Skin:  Skin is warm, dry and intact. No rash noted.  Superficial laceration to the dorsal aspect of the right thumb. Psychiatric: Mood and affect are normal. Speech and behavior are normal.  ____________________________________________   LABS (all labs ordered are listed, but only abnormal results are displayed)  Labs Reviewed - No data to display ____________________________________________  EKG   ____________________________________________  RADIOLOGY  ED MD interpretation:     Official radiology report(s): No results found.  ____________________________________________   PROCEDURES  Procedure(s) performed (including Critical Care):  Marland Kitchen.Marland Kitchen.Laceration Repair  Date/Time: 02/19/2019 11:33 AM Performed by: Joni ReiningSmith, Ronald K, PA-C Authorized by: Joni ReiningSmith, Ronald K, PA-C   Consent:    Consent obtained:  Verbal   Consent given by:  Patient   Risks discussed:  Infection and pain Anesthesia (see MAR for exact dosages):    Anesthesia method:  Nerve block   Block anesthetic:  Lidocaine 1% w/o epi   Block injection procedure:  Anatomic landmarks identified   Block outcome:  Anesthesia achieved Laceration details:    Location:  Finger   Finger location:  R thumb   Length (cm):  2 Repair type:    Repair type:  Simple Pre-procedure details:    Preparation:  Patient was prepped and draped in usual sterile fashion Treatment:    Area cleansed with:  Betadine and saline   Amount of cleaning:  Standard Skin repair:    Repair method:  Tissue adhesive Approximation:    Approximation:  Close Post-procedure details:    Dressing:  Adhesive bandage and splint for protection   Patient tolerance of procedure:  Tolerated well, no immediate complications     ____________________________________________   INITIAL IMPRESSION / ASSESSMENT AND PLAN / ED COURSE  As part of my medical decision making, I reviewed the following data within the electronic MEDICAL RECORD NUMBER         Emeline Darlingony B Kunde was evaluated in Emergency Department on 02/19/2019 for the symptoms described in the history of present illness. He was evaluated in the context of the global COVID-19 pandemic, which necessitated consideration that the patient might be at risk for infection with the SARS-CoV-2 virus that causes COVID-19. Institutional protocols and algorithms that pertain to the evaluation of patients at risk for COVID-19 are in a state of rapid change based on information released by regulatory bodies  including the CDC and federal and state organizations. These policies and algorithms were followed during the patient's care in the ED.  Patient present with superficial laceration to the dorsal aspect of the right thumb.  Area was cleaned and Dermabond.  Patient placed in a splint and given discharge care instruction.  Return to ED if wound reopens for healing is complete.      ____________________________________________   FINAL CLINICAL IMPRESSION(S) / ED DIAGNOSES  Final diagnoses:  Laceration of right thumb without foreign body without damage to nail, initial encounter     ED Discharge Orders    None       Note:  This document was prepared using Dragon voice recognition software and may include unintentional dictation errors.    Joni ReiningSmith, Ronald K, PA-C 02/19/19 1135    Dionne BucySiadecki, Sebastian, MD 02/19/19 [phone]

## 2019-02-19 NOTE — Discharge Instructions (Signed)
Follow discharge care instruction and wear splint for 3 to 5 days as needed. 

## 2019-02-19 NOTE — ED Triage Notes (Signed)
Right thumb laceration from razor blade cutting carpet

## 2019-02-25 ENCOUNTER — Other Ambulatory Visit: Payer: Self-pay

## 2019-02-25 ENCOUNTER — Ambulatory Visit: Payer: Medicaid Other | Admitting: Gerontology

## 2019-02-25 DIAGNOSIS — Z8619 Personal history of other infectious and parasitic diseases: Secondary | ICD-10-CM | POA: Insufficient documentation

## 2019-02-25 DIAGNOSIS — E1065 Type 1 diabetes mellitus with hyperglycemia: Secondary | ICD-10-CM

## 2019-02-25 DIAGNOSIS — E108 Type 1 diabetes mellitus with unspecified complications: Secondary | ICD-10-CM

## 2019-02-25 DIAGNOSIS — Z0000 Encounter for general adult medical examination without abnormal findings: Secondary | ICD-10-CM | POA: Insufficient documentation

## 2019-02-25 DIAGNOSIS — Z720 Tobacco use: Secondary | ICD-10-CM

## 2019-02-25 DIAGNOSIS — G629 Polyneuropathy, unspecified: Secondary | ICD-10-CM

## 2019-02-25 MED ORDER — INSULIN ASPART PROT & ASPART (70-30 MIX) 100 UNIT/ML ~~LOC~~ SUSP: 30.0000 [IU] | Freq: Every day | SUBCUTANEOUS | 2 refills | Status: DC

## 2019-02-25 MED ORDER — INSULIN ASPART 100 UNIT/ML ~~LOC~~ SOLN: 5.0000 [IU] | SUBCUTANEOUS | 2 refills | Status: DC

## 2019-02-25 MED ORDER — INSULIN ASPART PROT & ASPART (70-30 MIX) 100 UNIT/ML ~~LOC~~ SUSP: 32.0000 [IU] | Freq: Every day | SUBCUTANEOUS | 2 refills | Status: DC

## 2019-02-25 NOTE — Patient Instructions (Signed)
Carbohydrate Counting for Diabetes Mellitus, Adult  Carbohydrate counting is a method of keeping track of how many carbohydrates you eat. Eating carbohydrates naturally increases the amount of sugar (glucose) in the blood. Counting how many carbohydrates you eat helps keep your blood glucose within normal limits, which helps you manage your diabetes (diabetes mellitus). It is important to know how many carbohydrates you can safely have in each meal. This is different for every person. A diet and nutrition specialist (registered dietitian) can help you make a meal plan and calculate how many carbohydrates you should have at each meal and snack. Carbohydrates are found in the following foods:  Grains, such as breads and cereals.  Dried beans and soy products.  Starchy vegetables, such as potatoes, peas, and corn.  Fruit and fruit juices.  Milk and yogurt.  Sweets and snack foods, such as cake, cookies, candy, chips, and soft drinks. How do I count carbohydrates? There are two ways to count carbohydrates in food. You can use either of the methods or a combination of both. Reading "Nutrition Facts" on packaged food The "Nutrition Facts" list is included on the labels of almost all packaged foods and beverages in the [place] It includes:  The serving size.  Information about nutrients in each serving, including the grams (g) of carbohydrate per serving. To use the "Nutrition Facts":  Decide how many servings you will have.  Multiply the number of servings by the number of carbohydrates per serving.  The resulting number is the total amount of carbohydrates that you will be having. Learning standard serving sizes of other foods When you eat carbohydrate foods that are not packaged or do not include "Nutrition Facts" on the label, you need to measure the servings in order to count the amount of carbohydrates:  Measure the foods that you will eat with a food scale or measuring cup, if needed.   Decide how many standard-size servings you will eat.  Multiply the number of servings by 15. Most carbohydrate-rich foods have about 15 g of carbohydrates per serving. ? For example, if you eat 8 oz (170 g) of strawberries, you will have eaten 2 servings and 30 g of carbohydrates (2 servings x 15 g = 30 g).  For foods that have more than one food mixed, such as soups and casseroles, you must count the carbohydrates in each food that is included. The following list contains standard serving sizes of common carbohydrate-rich foods. Each of these servings has about 15 g of carbohydrates:   hamburger bun or  English muffin.   oz (15 mL) syrup.   oz (14 g) jelly.  1 slice of bread.  1 six-inch tortilla.  3 oz (85 g) cooked rice or pasta.  4 oz (113 g) cooked dried beans.  4 oz (113 g) starchy vegetable, such as peas, corn, or potatoes.  4 oz (113 g) hot cereal.  4 oz (113 g) mashed potatoes or  of a large baked potato.  4 oz (113 g) canned or frozen fruit.  4 oz (120 mL) fruit juice.  4-6 crackers.  6 chicken nuggets.  6 oz (170 g) unsweetened dry cereal.  6 oz (170 g) plain fat-free yogurt or yogurt sweetened with artificial sweeteners.  8 oz (240 mL) milk.  8 oz (170 g) fresh fruit or one small piece of fruit.  24 oz (680 g) popped popcorn. Example of carbohydrate counting Sample meal  3 oz (85 g) chicken breast.  6 oz (170 g)   brown rice.  4 oz (113 g) corn.  8 oz (240 mL) milk.  8 oz (170 g) strawberries with sugar-free whipped topping. Carbohydrate calculation 1. Identify the foods that contain carbohydrates: ? Rice. ? Corn. ? Milk. ? Strawberries. 2. Calculate how many servings you have of each food: ? 2 servings rice. ? 1 serving corn. ? 1 serving milk. ? 1 serving strawberries. 3. Multiply each number of servings by 15 g: ? 2 servings rice x 15 g = 30 g. ? 1 serving corn x 15 g = 15 g. ? 1 serving milk x 15 g = 15 g. ? 1 serving  strawberries x 15 g = 15 g. 4. Add together all of the amounts to find the total grams of carbohydrates eaten: ? 30 g + 15 g + 15 g + 15 g = 75 g of carbohydrates total. Summary  Carbohydrate counting is a method of keeping track of how many carbohydrates you eat.  Eating carbohydrates naturally increases the amount of sugar (glucose) in the blood.  Counting how many carbohydrates you eat helps keep your blood glucose within normal limits, which helps you manage your diabetes.  A diet and nutrition specialist (registered dietitian) can help you make a meal plan and calculate how many carbohydrates you should have at each meal and snack. This information is not intended to replace advice given to you by your health care provider. Make sure you discuss any questions you have with your health care provider. Document Released: 07/08/2005 Document Revised: 01/30/2017 Document Reviewed: 12/20/2015 Elsevier Patient Education  2020 Elsevier Inc.  

## 2019-02-25 NOTE — Progress Notes (Signed)
Established Patient Office Visit  Subjective:  Patient ID: Trevor Brown, male    DOB: March 20, 1991  Age: 28 y.o. MRN: [id number]  CC: No chief complaint on file. Patient consents to telephone visit and 2 patient identifiers was used to identify patient.  HPI Trevor Brown presents for follow up of type 1 diabetes. He was treated at the ED on 02/19/19 for laceration he sustained to his right thumb while cutting carpet. He reports that the wound was glued and it's almost healed. He has not being seen at the clinic for more than 1 year. He reports checking his blood glucose tid, his fasting blood glucose was 200 mg/dl this morning and his pre lunch and dinner readings ranges between 300-400 mg/dl. He reports experiencing intermittent peripheral neuropathy that resolves within 30 minutes, hyperglycemic symptoms and denies hypoglycemia. He takes 30 units of Novolog mix 70/30 with breakfast, 32 units with dinner and Novolog sliding scale. He performs daily foot checks. He equally endorses having a history of Hepatitis C that has not being treated, he denies right upper quadrant abdominal pain, jaundice and dark colored urine. He denies chest pain, palpitation, fever, chills and no further concerns.  Past Medical History:  Diagnosis Date  . Diabetes mellitus without complication ([place])   . Heart attack ([place])    Pt claims he had heart attack a year ago ( 2015)- and was admitted in [facility] for that, but not given any meds or angiogram, on review of chart- I could not find any details like that.  . Hepatitis C, acute may 2016  . Hypertension     Past Surgical History:  Procedure Laterality Date  . none      Family History  Problem Relation Age of Onset  . Cirrhosis Mother   . Diabetes Mellitus II Maternal Grandmother   . Diabetes Mellitus II Maternal Grandfather     Social History   Socioeconomic History  . Marital status: Single    Spouse name: Not on file  . Number of children: Not on file   . Years of education: Not on file  . Highest education level: Not on file  Occupational History  . Occupation: tree cutter  Social Needs  . Financial resource strain: Not on file  . Food insecurity    Worry: Not on file    Inability: Not on file  . Transportation needs    Medical: Not on file    Non-medical: Not on file  Tobacco Use  . Smoking status: Current Every Day Smoker    Packs/day: 2.00    Types: Cigarettes  . Smokeless tobacco: Never Used  Substance and Sexual Activity  . Alcohol use: No    Alcohol/week: 0.0 standard drinks  . Drug use: Yes    Types: IV    Comment: heroin- last used October 04 2018  . Sexual activity: Yes  Lifestyle  . Physical activity    Days per week: Not on file    Minutes per session: Not on file  . Stress: Not on file  Relationships  . Social Herbalist on phone: Not on file    Gets together: Not on file    Attends religious service: Not on file    Active member of club or organization: Not on file    Attends meetings of clubs or organizations: Not on file    Relationship status: Not on file  . Intimate partner violence    Fear of current or  ex partner: Not on file    Emotionally abused: Not on file    Physically abused: Not on file    Forced sexual activity: Not on file  Other Topics Concern  . Not on file  Social History Narrative  . Not on file    Outpatient Medications Prior to Visit  Medication Sig Dispense Refill  . acetaminophen (TYLENOL) 500 MG tablet Take 1,000 mg by mouth every 6 (six) hours as needed for pain.    Marland Kitchen insulin aspart (NOVOLOG) 100 UNIT/ML injection Inject 5-10 Units into the skin See admin instructions. INJECT BEFORE MEALS PER SLIDING SCALE AS DIRECTED: 120-250 = 5 UNITS;251-350 = 8 UNITS; OVER 350 = 10 UNITS 50 mL 0  . insulin aspart protamine- aspart (NOVOLOG MIX 70/30) (70-30) 100 UNIT/ML injection Inject 0.3 mLs (30 Units total) into the skin daily with breakfast. 50 mL 2  . insulin aspart  protamine- aspart (NOVOLOG MIX 70/30) (70-30) 100 UNIT/ML injection Inject 0.32 mLs (32 Units total) into the skin daily with supper. 10 mL 2  . ondansetron (ZOFRAN) 4 MG tablet Take 1 tablet (4 mg total) by mouth every 8 (eight) hours as needed. (Patient taking differently: Take 4 mg by mouth every 8 (eight) hours as needed for nausea or vomiting. ) 20 tablet 0   No facility-administered medications prior to visit.     Allergies  Allergen Reactions  . Bee Venom Anaphylaxis  . Penicillins Anaphylaxis, Hives and Other (See Comments)    Has patient had a PCN reaction causing immediate rash, facial/tongue/throat swelling, SOB or lightheadedness with hypotension: Yes Has patient had a PCN reaction causing severe rash involving mucus membranes or skin necrosis: No Has patient had a PCN reaction that required hospitalization No Has patient had a PCN reaction occurring within the last 10 years: Yes If all of the above answers are "NO", then may proceed with Cephalosporin use.  . Ibuprofen Other (See Comments)    Other reaction(s): Other (See Comments) Stomach upset Reaction:  GI upset   . Tramadol Hives and Swelling  . Vancomycin Rash and Other (See Comments)    Reaction:  Red man's syndrome     ROS Review of Systems  Constitutional: Negative.   HENT: Negative.   Eyes: Negative.   Respiratory: Negative.   Cardiovascular: Negative.   Gastrointestinal: Negative.   Endocrine: Positive for polydipsia, polyphagia and polyuria.  Genitourinary: Negative.   Musculoskeletal: Negative.   Skin: Negative.  Negative for wound.  Neurological: Positive for numbness.  Psychiatric/Behavioral: Negative.       Objective:    Physical Exam No vital sign or PE was done. There were no vitals taken for this visit. Wt Readings from Last 3 Encounters:  10/11/18 120 lb (54.4 kg)  10/07/18 120 lb (54.4 kg)  08/18/18 111 lb 15.9 oz (50.8 kg)     Health Maintenance Due  Topic Date Due  .  PNEUMOCOCCAL POLYSACCHARIDE VACCINE AGE 59-64 HIGH RISK  01/05/1995  . FOOT EXAM  01/05/2003  . OPHTHALMOLOGY EXAM  01/05/2003  . URINE MICROALBUMIN  01/05/2003  . TETANUS/TDAP  01/05/2012  . HEMOGLOBIN A1C  10/16/2017  . INFLUENZA VACCINE  02/20/2019    There are no preventive care reminders to display for this patient.  Lab Results  Component Value Date   TSH 0.240 (L) 04/18/2017   Lab Results  Component Value Date   WBC 9.4 10/13/2018   HGB 11.5 (L) 10/13/2018   HCT 35.6 (L) 10/13/2018   MCV 89.9 10/13/2018  PLT 248 10/13/2018   Lab Results  Component Value Date   NA 134 (L) 10/13/2018   K 4.5 10/13/2018   CO2 23 10/13/2018   GLUCOSE 541 (HH) 10/15/2018   BUN 15 10/13/2018   CREATININE 0.60 (L) 10/13/2018   BILITOT 0.4 08/19/2018   ALKPHOS 163 (H) 08/19/2018   AST 123 (H) 08/19/2018   ALT 102 (H) 08/19/2018   PROT 5.7 (L) 08/19/2018   ALBUMIN 2.8 (L) 08/19/2018   CALCIUM 8.2 (L) 10/13/2018   ANIONGAP 6 10/13/2018   Lab Results  Component Value Date   CHOL 209 (H) 12/31/2016   Lab Results  Component Value Date   HDL 40 12/31/2016   Lab Results  Component Value Date   LDLCALC 115 (H) 12/31/2016   Lab Results  Component Value Date   TRIG 271 (H) 12/31/2016   Lab Results  Component Value Date   CHOLHDL 5.2 (H) 12/31/2016   Lab Results  Component Value Date   HGBA1C 10.6 (H) 04/18/2017      Assessment & Plan:     1. Tobacco abuse He was encouraged on smoking cessation.  2. Type 1 diabetes mellitus with hyperglycemia ([place]) - He will continue on current treatment regimen pending HgbA1c. He was advised to check, record and bring blood glucose log to office visit. He was advised to continue on low car/non concentrated sweet diet. - insulin aspart (NOVOLOG) 100 UNIT/ML injection; Inject 5-10 Units into the skin See admin instructions. INJECT BEFORE MEALS PER SLIDING SCALE AS DIRECTED: 120-250 = 5 UNITS;251-350 = 8 UNITS; OVER 350 = 10 UNITS   Dispense: 50 mL; Refill: 2 - insulin aspart protamine- aspart (NOVOLOG MIX 70/30) (70-30) 100 UNIT/ML injection; Inject 0.3 mLs (30 Units total) into the skin daily with breakfast.  Dispense: 50 mL; Refill: 2 - insulin aspart protamine- aspart (NOVOLOG MIX 70/30) (70-30) 100 UNIT/ML injection; Inject 0.32 mLs (32 Units total) into the skin daily with supper.  Dispense: 10 mL; Refill: 2 - HgB A1c; Future - Endocrinology referral  3. History of hepatitis C  - Hepatitis, Acute; will be checked and possible Gastroenterology referral.  4. Health care maintenance - Routine labs will be collected. - CBC w/Diff; Future - Comp Met (CMET); Future - Lipid panel; Future - TSH; Future - Urinalysis; Future - Urine Microalbumin w/creat. ratio; Future  5. Peripheral polyneuropathy - He was advised on lowering his blood glucose level and to notify clinic for worsening symptoms.   Follow-up: Return in about 12 days (around 03/09/2019), or if symptoms worsen or fail to improve.    Alisan Dokes Jerold Coombe, NP

## 2019-02-26 ENCOUNTER — Encounter: Payer: Self-pay | Admitting: Gerontology

## 2019-03-01 ENCOUNTER — Other Ambulatory Visit: Payer: Self-pay

## 2019-03-01 ENCOUNTER — Emergency Department: Payer: Self-pay

## 2019-03-01 ENCOUNTER — Emergency Department
Admission: EM | Admit: 2019-03-01 | Discharge: 2019-03-02 | Disposition: A | Discharge status: Home / Self Care | Payer: Self-pay | Attending: Emergency Medicine | Admitting: Emergency Medicine

## 2019-03-01 ENCOUNTER — Encounter: Payer: Self-pay | Admitting: Emergency Medicine

## 2019-03-01 DIAGNOSIS — Z794 Long term (current) use of insulin: Secondary | ICD-10-CM | POA: Insufficient documentation

## 2019-03-01 DIAGNOSIS — I10 Essential (primary) hypertension: Secondary | ICD-10-CM | POA: Insufficient documentation

## 2019-03-01 DIAGNOSIS — F17210 Nicotine dependence, cigarettes, uncomplicated: Secondary | ICD-10-CM | POA: Insufficient documentation

## 2019-03-01 DIAGNOSIS — E109 Type 1 diabetes mellitus without complications: Secondary | ICD-10-CM | POA: Insufficient documentation

## 2019-03-01 DIAGNOSIS — R1031 Right lower quadrant pain: Secondary | ICD-10-CM | POA: Insufficient documentation

## 2019-03-01 DIAGNOSIS — R109 Unspecified abdominal pain: Secondary | ICD-10-CM

## 2019-03-01 LAB — CBC WITH DIFFERENTIAL/PLATELET
Abs Immature Granulocytes: 0.06 10*3/uL (ref 0.00–0.07)
Basophils Absolute: 0.1 10*3/uL (ref 0.0–0.1)
Basophils Relative: 0 %
Eosinophils Absolute: 0.3 10*3/uL (ref 0.0–0.5)
Eosinophils Relative: 2 %
HCT: 44.3 % (ref 39.0–52.0)
Hemoglobin: 14.6 g/dL (ref 13.0–17.0)
Immature Granulocytes: 0 %
Lymphocytes Relative: 25 %
Lymphs Abs: 3.4 10*3/uL (ref 0.7–4.0)
MCH: 28.5 pg (ref 26.0–34.0)
MCHC: 33 g/dL (ref 30.0–36.0)
MCV: 86.4 fL (ref 80.0–100.0)
Monocytes Absolute: 1.3 10*3/uL — ABNORMAL HIGH (ref 0.1–1.0)
Monocytes Relative: 10 %
Neutro Abs: 8.5 10*3/uL — ABNORMAL HIGH (ref 1.7–7.7)
Neutrophils Relative %: 63 %
Platelets: 328 10*3/uL (ref 150–400)
RBC: 5.13 MIL/uL (ref 4.22–5.81)
RDW: 13.8 % (ref 11.5–15.5)
WBC: 13.6 10*3/uL — ABNORMAL HIGH (ref 4.0–10.5)
nRBC: 0 % (ref 0.0–0.2)

## 2019-03-01 LAB — URINALYSIS, COMPLETE (UACMP) WITH MICROSCOPIC
Bacteria, UA: NONE SEEN
Bilirubin Urine: NEGATIVE
Glucose, UA: NEGATIVE mg/dL
Ketones, ur: NEGATIVE mg/dL
Leukocytes,Ua: NEGATIVE
Nitrite: NEGATIVE
Protein, ur: 100 mg/dL — AB
RBC / HPF: >50 RBC/hpf — ABNORMAL HIGH (ref 0–5)
Specific Gravity, Urine: 1.025 (ref 1.005–1.030)
Squamous Epithelial / HPF: NONE SEEN (ref 0–5)
pH: 5 (ref 5.0–8.0)

## 2019-03-01 LAB — COMPREHENSIVE METABOLIC PANEL
ALT: 93 U/L — ABNORMAL HIGH (ref 0–44)
AST: 118 U/L — ABNORMAL HIGH (ref 15–41)
Albumin: 4.8 g/dL (ref 3.5–5.0)
Alkaline Phosphatase: 130 U/L — ABNORMAL HIGH (ref 38–126)
Anion gap: 12 (ref 5–15)
BUN: 27 mg/dL — ABNORMAL HIGH (ref 6–20)
CO2: 24 mmol/L (ref 22–32)
Calcium: 9.2 mg/dL (ref 8.9–10.3)
Chloride: 104 mmol/L (ref 98–111)
Creatinine, Ser: 1.06 mg/dL (ref 0.61–1.24)
GFR calc Af Amer: >60 mL/min (ref >60)
GFR calc non Af Amer: >60 mL/min (ref >60)
Glucose, Bld: 74 mg/dL (ref 70–99)
Potassium: 3.6 mmol/L (ref 3.5–5.1)
Sodium: 140 mmol/L (ref 135–145)
Total Bilirubin: 0.7 mg/dL (ref 0.3–1.2)
Total Protein: 8.5 g/dL — ABNORMAL HIGH (ref 6.5–8.1)

## 2019-03-01 LAB — GLUCOSE, CAPILLARY: Glucose-Capillary: 73 mg/dL (ref 70–99)

## 2019-03-01 MED ORDER — ONDANSETRON HCL 4 MG/2ML IJ SOLN
4.0000 mg | Freq: Once | INTRAMUSCULAR | Status: AC
  Administered 2019-03-01: 4 mg via INTRAVENOUS
  Filled 2019-03-01: qty 2

## 2019-03-01 MED ORDER — HYDROMORPHONE HCL 1 MG/ML IJ SOLN
0.5000 mg | Freq: Once | INTRAMUSCULAR | Status: AC
  Administered 2019-03-01: 0.5 mg via INTRAVENOUS
  Filled 2019-03-01: qty 1

## 2019-03-01 MED ORDER — SULFAMETHOXAZOLE-TRIMETHOPRIM 800-160 MG PO TABS: 1.0000 | ORAL_TABLET | Freq: Two times a day (BID) | ORAL | 0 refills | Status: DC

## 2019-03-01 MED ORDER — MORPHINE SULFATE (PF) 4 MG/ML IV SOLN
4.0000 mg | Freq: Once | INTRAVENOUS | Status: AC
  Administered 2019-03-01: 4 mg via INTRAVENOUS
  Filled 2019-03-01: qty 1

## 2019-03-01 NOTE — ED Notes (Signed)
Pt laying on bed comfortably at this time.

## 2019-03-01 NOTE — Discharge Instructions (Addendum)
Please return for worse pain, fever or vomiting.  Please take the Bactrim 1 pill twice a day as it looks like you may have a urinary tract infection.  Use Motrin 3 of the over-the-counter pills 3 times a day for pain.  Do not take this for more than 3 days.  If needed for longer please come back in recheck with [place] or see your doctor.  If you do not have a doctor please follow-up with the [facility] clinic or the [facility] or the [place] [facility] or [facility] care or the [facility] or [place] care.  All of these will see you for reduced cost or for nothing.  Again you can always return here for any further problems.  I would have someone check and make sure the urine is clear of blood after about 2 weeks.

## 2019-03-01 NOTE — ED Triage Notes (Addendum)
Patient ambulatory to triage with steady gait, without difficulty or distress noted; pt reports pain to rt side for several days accomp by dark urine and fluctutating FSBS; st hx kidney stones

## 2019-03-01 NOTE — ED Notes (Signed)
Patient transported to CT 

## 2019-03-01 NOTE — ED Provider Notes (Signed)
[facility] Emergency Department Provider Note   ____________________________________________   First MD Initiated Contact with Patient 03/01/19 2103     (approximate)  I have reviewed the triage vital signs and the nursing notes.   HISTORY  Chief Complaint Flank Pain    HPI Trevor Brown is a 28 y.o. male patient reports right flank pain starting yesterday.  Says the pain is moderately severe not associated with walking or eating.  It is made worse by some movements.  Patient does have hematuria and some pyuria but not any bad dysuria.  Low-grade fever now.  Otherwise he has been doing okay except for this pain         Past Medical History:  Diagnosis Date   Diabetes mellitus without complication (HCC)    Heart attack (HCC)    Pt claims he had heart attack a year ago ( 2015)- and was [facility], but not given any meds or angiogram, on review of chart- I could not find any details like that.   Hepatitis C, acute may 2016   Hypertension     Patient Active Problem List   Diagnosis Date Noted   History of hepatitis C 02/25/2019   Health care maintenance 02/25/2019   Peripheral neuropathy 02/25/2019   Hand abscess 10/13/2018   IVDU (intravenous drug user) 10/11/2018   Hyperkalemia 10/11/2018   Cellulitis and abscess of hand 08/17/2018   Abdominal pain 06/29/2017   Hyponatremia 06/29/2017   DKA (diabetic ketoacidosis) (HCC) 06/29/2017   Dental erosion extending into pulp 12/31/2016   MRSA carrier 07/05/2016   Elevated transaminase level 03/27/2016   Diabetes mellitus type 1 (HCC) 03/27/2016   Tobacco abuse counseling 03/27/2016   Transaminitis 03/25/2016   Tobacco abuse 01/10/2016   Type 1 diabetes mellitus with hyperglycemia (HCC) 12/12/2015   Cocaine abuse (HCC) 12/12/2015   DKA (diabetic ketoacidoses) (HCC) 06/10/2015   Hidradenitis suppurativa of left axilla    DKA, type 1 (HCC) 06/06/2015    Malnutrition of moderate degree (HCC) 04/08/2015   Hepatitis C 12/13/2014   Diabetes type 1, uncontrolled (HCC) 12/11/2014   Major depressive disorder, single episode, mild (HCC)    Major depression, single episode 12/10/2014   Diabetes mellitus type 1, uncontrolled (HCC) 12/09/2014   Homelessness 12/09/2014    Past Surgical History:  Procedure Laterality Date   none      Prior to Admission medications   Medication Sig Start Date End Date Taking? Authorizing Provider  acetaminophen (TYLENOL) 500 MG tablet Take 1,000 mg by mouth every 6 (six) hours as needed for pain. 06/17/18 06/17/19  [provider]  insulin aspart (NOVOLOG) 100 UNIT/ML injection Inject 5-10 Units into the skin See admin instructions. INJECT BEFORE MEALS PER SLIDING SCALE AS DIRECTED: 120-250 = 5 UNITS;251-350 = 8 UNITS; OVER 350 = 10 UNITS 02/25/19   Iloabachie, [name], NP  insulin aspart protamine- aspart (NOVOLOG MIX 70/30) (70-30) 100 UNIT/ML injection Inject 0.3 mLs (30 Units total) into the skin daily with breakfast. 02/25/19   Iloabachie, [name], NP  insulin aspart protamine- aspart (NOVOLOG MIX 70/30) (70-30) 100 UNIT/ML injection Inject 0.32 mLs (32 Units total) into the skin daily with supper. 02/25/19   Iloabachie, [name], NP    Allergies Bee venom, Penicillins, Ibuprofen, Tramadol, and Vancomycin  Family History  Problem Relation Age of Onset   Cirrhosis Mother    Diabetes Mellitus II Maternal Grandmother    Diabetes Mellitus II Maternal Grandfather  Social History Social History   Tobacco Use   Smoking status: Current Every Day Smoker    Packs/day: 2.00    Types: Cigarettes   Smokeless tobacco: Never Used  Substance Use Topics   Alcohol use: No    Alcohol/week: 0.0 standard drinks   Drug use: Yes    Types: IV    Comment: heroin- last used October 04 2018    Review of Systems  Constitutional: No fever/chills Eyes: No visual changes. ENT: No sore  throat. Cardiovascular: Denies chest pain. Respiratory: Denies shortness of breath. Gastrointestinal:  abdominal pain.  No nausea, no vomiting.  No diarrhea.  No constipation. Genitourinary: Minimal dysuria. Musculoskeletal: Negative for back pain. Skin: Negative for rash. Neurological: Negative for headaches, focal weakness   ____________________________________________   PHYSICAL EXAM:  VITAL SIGNS: ED Triage Vitals  Enc Vitals Group     BP 03/01/19 1912 (!) 146/83     Pulse Rate 03/01/19 1912 (!) 118     Resp 03/01/19 1912 18     Temp 03/01/19 1912 99.5 F (37.5 C)     Temp Source 03/01/19 1912 Oral     SpO2 03/01/19 1912 99 %     Weight 03/01/19 1913 150 lb (68 kg)     Height 03/01/19 1913 5\' 4"  (1.626 m)     Head Circumference --      Peak Flow --      Pain Score 03/01/19 1912 9     Pain Loc --      Pain Edu? --      Excl. in [place]? --     Constitutional: Alert and oriented. Well appearing and in no acute distress. Eyes: Conjunctivae are normal.  Head: Atraumatic. Nose: No congestion/rhinnorhea. Mouth/Throat: Mucous membranes are moist.  Oropharynx non-erythematous. Neck: No stridor. Cardiovascular: Normal rate, regular rhythm. Grossly normal heart sounds.  Good peripheral circulation. Respiratory: Normal respiratory effort.  No retractions. Lungs CTAB. Gastrointestinal: Soft and nontender. No distention. No abdominal bruits. No CVA tenderness. Musculoskeletal: No lower extremity tenderness nor edema.  Neurologic:  Normal speech and language. No gross focal neurologic deficits are appreciated. No gait instability. Skin:  Skin is warm, dry and intact. No rash noted.  ____________________________________________   LABS (all labs ordered are listed, but only abnormal results are displayed)  Labs Reviewed  CBC WITH DIFFERENTIAL/PLATELET - Abnormal; Notable for the following components:      Result Value   WBC 13.6 (*)    Neutro Abs 8.5 (*)    Monocytes Absolute  1.3 (*)    All other components within normal limits  COMPREHENSIVE METABOLIC PANEL - Abnormal; Notable for the following components:   BUN 27 (*)    Total Protein 8.5 (*)    AST 118 (*)    ALT 93 (*)    Alkaline Phosphatase 130 (*)    All other components within normal limits  URINALYSIS, COMPLETE (UACMP) WITH MICROSCOPIC - Abnormal; Notable for the following components:   Color, Urine YELLOW (*)    APPearance HAZY (*)    Hgb urine dipstick MODERATE (*)    Protein, ur 100 (*)    RBC / HPF >50 (*)    All other components within normal limits  GLUCOSE, CAPILLARY  CBG MONITORING, ED   ____________________________________________  EKG   ____________________________________________  RADIOLOGY  ED MD interpretation: CT read by radiology reviewed by me is negative except for the punctate renal stone in the kidney.  Official radiology report(s): Ct Renal Stone Study  Result Date: 03/01/2019 CLINICAL DATA:  Right flank pain and microscopic hematuria EXAM: CT ABDOMEN AND PELVIS WITHOUT CONTRAST TECHNIQUE: Multidetector CT imaging of the abdomen and pelvis was performed following the standard protocol without IV contrast. COMPARISON:  CT August 17, 2018 FINDINGS: Lower chest: The visualized heart size within normal limits. No pericardial fluid/thickening. No hiatal hernia. The visualized portions of the lungs are clear. Hepatobiliary: Although limited due to the lack of intravenous contrast, normal in appearance without gross focal abnormality. No evidence of calcified gallstones or biliary ductal dilatation. Pancreas:  Unremarkable.  No surrounding inflammatory changes. Spleen: Normal in size. Although limited due to the lack of intravenous contrast, normal in appearance. Adrenals/Urinary Tract: Both adrenal glands appear normal. Again noted is punctate calcifications seen within the lower pole of the right kidney as on the prior exam. No left-sided renal calculi are seen. No ureteral or  bladder calculi are noted. No hydronephrosis. Stomach/Bowel: The stomach, small bowel, and colon are normal in appearance. No inflammatory changes or obstructive findings. A moderate amount of right colonic stool is present. The appendix is normal in appearance. Vascular/Lymphatic: There are no enlarged abdominal or pelvic lymph nodes. No significant gross vascular findings are present. Reproductive: The prostate is unremarkable. Other: No evidence of abdominal wall mass or hernia. Musculoskeletal: No acute or significant osseous findings. IMPRESSION: Punctate right renal calculi without hydronephrosis. Moderate amount of right colonic stool without evidence of obstruction. Electronically Signed   By: Jonna ClarkBindu  Avutu M.D.   On: 03/01/2019 22:17    ____________________________________________   PROCEDURES  Procedure(s) performed (including Critical Care):  Procedures   ____________________________________________   INITIAL IMPRESSION / ASSESSMENT AND PLAN / ED COURSE   Patient does have right flank pain history of elevated LFTs which is still elevated now.  He has some red cells and white cells in his urine.  He also has a low-grade fever I will give him some Bactrim in case he does have a UTI.  I will have him take Motrin and return for any worsening.  Follow-up with his primary care doctor.             ____________________________________________   FINAL CLINICAL IMPRESSION(S) / ED DIAGNOSES  Final diagnoses:  Right flank pain     ED Discharge Orders    None       Note:  This document was prepared using Dragon voice recognition software and may include unintentional dictation errors.    Arnaldo NatalMalinda, Jahnyla Parrillo F, MD 03/01/19 [phone]

## 2019-03-01 NOTE — ED Notes (Signed)
This Rn attempted 2X PIV w/no success. Sonja RN at bedside to assist this RN.

## 2019-03-02 ENCOUNTER — Encounter: Payer: Self-pay | Admitting: Emergency Medicine

## 2019-03-02 ENCOUNTER — Emergency Department
Admission: EM | Admit: 2019-03-02 | Discharge: 2019-03-02 | Disposition: A | Discharge status: Home / Self Care | Payer: Self-pay | Attending: Emergency Medicine | Admitting: Emergency Medicine

## 2019-03-02 ENCOUNTER — Other Ambulatory Visit: Payer: Self-pay

## 2019-03-02 DIAGNOSIS — E109 Type 1 diabetes mellitus without complications: Secondary | ICD-10-CM | POA: Insufficient documentation

## 2019-03-02 DIAGNOSIS — Z79899 Other long term (current) drug therapy: Secondary | ICD-10-CM | POA: Insufficient documentation

## 2019-03-02 DIAGNOSIS — I10 Essential (primary) hypertension: Secondary | ICD-10-CM | POA: Insufficient documentation

## 2019-03-02 DIAGNOSIS — I252 Old myocardial infarction: Secondary | ICD-10-CM | POA: Insufficient documentation

## 2019-03-02 DIAGNOSIS — F17210 Nicotine dependence, cigarettes, uncomplicated: Secondary | ICD-10-CM | POA: Insufficient documentation

## 2019-03-02 DIAGNOSIS — R109 Unspecified abdominal pain: Secondary | ICD-10-CM | POA: Insufficient documentation

## 2019-03-02 DIAGNOSIS — Z794 Long term (current) use of insulin: Secondary | ICD-10-CM | POA: Insufficient documentation

## 2019-03-02 LAB — CBC WITH DIFFERENTIAL/PLATELET
Abs Immature Granulocytes: 0.02 10*3/uL (ref 0.00–0.07)
Basophils Absolute: 0 10*3/uL (ref 0.0–0.1)
Basophils Relative: 1 %
Eosinophils Absolute: 0.2 10*3/uL (ref 0.0–0.5)
Eosinophils Relative: 2 %
HCT: 44.3 % (ref 39.0–52.0)
Hemoglobin: 14.5 g/dL (ref 13.0–17.0)
Immature Granulocytes: 0 %
Lymphocytes Relative: 18 %
Lymphs Abs: 1.5 10*3/uL (ref 0.7–4.0)
MCH: 28.1 pg (ref 26.0–34.0)
MCHC: 32.7 g/dL (ref 30.0–36.0)
MCV: 85.9 fL (ref 80.0–100.0)
Monocytes Absolute: 0.7 10*3/uL (ref 0.1–1.0)
Monocytes Relative: 8 %
Neutro Abs: 5.8 10*3/uL (ref 1.7–7.7)
Neutrophils Relative %: 71 %
Platelets: 277 10*3/uL (ref 150–400)
RBC: 5.16 MIL/uL (ref 4.22–5.81)
RDW: 13.9 % (ref 11.5–15.5)
WBC: 8.3 10*3/uL (ref 4.0–10.5)
nRBC: 0 % (ref 0.0–0.2)

## 2019-03-02 LAB — URINALYSIS, COMPLETE (UACMP) WITH MICROSCOPIC
Bacteria, UA: NONE SEEN
Bilirubin Urine: NEGATIVE
Glucose, UA: NEGATIVE mg/dL
Hgb urine dipstick: NEGATIVE
Ketones, ur: NEGATIVE mg/dL
Nitrite: NEGATIVE
Protein, ur: 30 mg/dL — AB
Specific Gravity, Urine: 1.019 (ref 1.005–1.030)
pH: 5 (ref 5.0–8.0)

## 2019-03-02 LAB — COMPREHENSIVE METABOLIC PANEL
ALT: 96 U/L — ABNORMAL HIGH (ref 0–44)
AST: 137 U/L — ABNORMAL HIGH (ref 15–41)
Albumin: 4.4 g/dL (ref 3.5–5.0)
Alkaline Phosphatase: 126 U/L (ref 38–126)
Anion gap: 10 (ref 5–15)
BUN: 25 mg/dL — ABNORMAL HIGH (ref 6–20)
CO2: 24 mmol/L (ref 22–32)
Calcium: 9.1 mg/dL (ref 8.9–10.3)
Chloride: 101 mmol/L (ref 98–111)
Creatinine, Ser: 0.84 mg/dL (ref 0.61–1.24)
GFR calc Af Amer: >60 mL/min (ref >60)
GFR calc non Af Amer: >60 mL/min (ref >60)
Glucose, Bld: 181 mg/dL — ABNORMAL HIGH (ref 70–99)
Potassium: 3.8 mmol/L (ref 3.5–5.1)
Sodium: 135 mmol/L (ref 135–145)
Total Bilirubin: 0.7 mg/dL (ref 0.3–1.2)
Total Protein: 8 g/dL (ref 6.5–8.1)

## 2019-03-02 LAB — GLUCOSE, CAPILLARY: Glucose-Capillary: 169 mg/dL — ABNORMAL HIGH (ref 70–99)

## 2019-03-02 MED ORDER — POLYETHYLENE GLYCOL 3350 17 G PO PACK
17.0000 g | PACK | Freq: Every day | ORAL | Status: DC
  Administered 2019-03-02: 17 g via ORAL
  Filled 2019-03-02: qty 1

## 2019-03-02 MED ORDER — KETOROLAC TROMETHAMINE 10 MG PO TABS
10.0000 mg | ORAL_TABLET | Freq: Once | ORAL | Status: AC
  Administered 2019-03-02: 10 mg via ORAL
  Filled 2019-03-02 (×2): qty 1

## 2019-03-02 NOTE — ED Provider Notes (Signed)
[facility] Emergency Department Provider Note ___________   First MD Initiated Contact with Patient 03/02/19 1307     (approximate)  I have reviewed the triage vital signs and the nursing notes.   HISTORY  Chief Complaint Flank Pain    HPI Trevor Brown is a 28 y.o. male with below list of previous medical conditions including diabetes, hypertension and heart attack  returns to the emergency department secondary to right upper quadrant/flank pain.  Patient states current pain score is 10 out of 10.  Patient denies any nausea vomiting diarrhea constipation.  Patient denies any fever.  Patient denies any urinary symptoms.        Past Medical History:  Diagnosis Date  . Diabetes mellitus without complication (HCC)   . Heart attack (HCC)    Pt claims he had heart attack a year ago ( 2015)- and was [facility], but not given any meds or angiogram, on review of chart- I could not find any details like that.  . Hepatitis C, acute may 2016  . Hypertension     Patient Active Problem List   Diagnosis Date Noted  . History of hepatitis C 02/25/2019  . Health care maintenance 02/25/2019  . Peripheral neuropathy 02/25/2019  . Hand abscess 10/13/2018  . IVDU (intravenous drug user) 10/11/2018  . Hyperkalemia 10/11/2018  . Cellulitis and abscess of hand 08/17/2018  . Abdominal pain 06/29/2017  . Hyponatremia 06/29/2017  . DKA (diabetic ketoacidosis) (HCC) 06/29/2017  . Dental erosion extending into pulp 12/31/2016  . 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 Elevated transaminase level 03/27/2016  . Diabetes mellitus type 1 (HCC) 03/27/2016  . Tobacco abuse counseling 03/27/2016  . Transaminitis 03/25/2016  . Tobacco abuse 01/10/2016  . Type 1 diabetes mellitus with hyperglycemia (HCC) 12/12/2015  . Cocaine abuse (HCC) 12/12/2015  . DKA (diabetic ketoacidoses) (HCC) 06/10/2015  . Hidradenitis suppurativa of left axilla   . DKA, type 1 (HCC) 06/06/2015   . Malnutrition of moderate degree (HCC) 04/08/2015  . Hepatitis C 12/13/2014  . Diabetes type 1, uncontrolled (HCC) 12/11/2014  . Major depressive disorder, single episode, mild (HCC)   . Major depression, single episode 12/10/2014  . Diabetes mellitus type 1, uncontrolled (HCC) 12/09/2014  . Homelessness 12/09/2014    Past Surgical History:  Procedure Laterality Date  . none      Prior to Admission medications   Medication Sig Start Date End Date Taking? Authorizing Provider  acetaminophen (TYLENOL) 500 MG tablet Take 1,000 mg by mouth every 6 (six) hours as needed for pain. 06/17/18 06/17/19  [provider]  insulin aspart (NOVOLOG) 100 UNIT/ML injection Inject 5-10 Units into the skin See admin instructions. INJECT BEFORE MEALS PER SLIDING SCALE AS DIRECTED: 120-250 = 5 UNITS;251-350 = 8 UNITS; OVER 350 = 10 UNITS 02/25/19   Iloabachie, [name], NP  insulin aspart protamine- aspart (NOVOLOG MIX 70/30) (70-30) 100 UNIT/ML injection Inject 0.3 mLs (30 Units total) into the skin daily with breakfast. 02/25/19   Iloabachie, [name], NP  insulin aspart protamine- aspart (NOVOLOG MIX 70/30) (70-30) 100 UNIT/ML injection Inject 0.32 mLs (32 Units total) into the skin daily with supper. 02/25/19   Iloabachie, [name], NP  sulfamethoxazole-trimethoprim (BACTRIM DS) 800-160 MG tablet Take 1 tablet by mouth 2 (two) times daily. 03/01/19   Arnaldo NatalMalinda, Paul F, MD    Allergies Bee venom, Penicillins, Ibuprofen, Tramadol, and Vancomycin  Family History  Problem Relation Age of Onset  . Cirrhosis  Mother   . Diabetes Mellitus II Maternal Grandmother   . Diabetes Mellitus II Maternal Grandfather     Social History Social History   Tobacco Use  . Smoking status: Current Every Day Smoker    Packs/day: 2.00    Types: Cigarettes  . Smokeless tobacco: Never Used  Substance Use Topics  . Alcohol use: No    Alcohol/week: 0.0 standard drinks  . Drug use: Yes    Types: IV    Comment:  heroin- last used October 04 2018    Review of Systems Constitutional: No fever/chills Eyes: No visual changes. ENT: No sore throat. Cardiovascular: Denies chest pain. Respiratory: Denies shortness of breath. Gastrointestinal: Positive for abdominal pain.  No nausea, no vomiting.  No diarrhea.  No constipation. Genitourinary: Negative for dysuria. Musculoskeletal: Negative for neck pain.  Negative for back pain. Integumentary: Negative for rash. Neurological: Negative for headaches, focal weakness or numbness.   ____________________________________________   PHYSICAL EXAM:  VITAL SIGNS: ED Triage Vitals  Enc Vitals Group     BP 03/02/19 1212 131/83     Pulse Rate 03/02/19 1212 95     Resp 03/02/19 1212 18     Temp 03/02/19 1212 99.1 F (37.3 C)     Temp Source 03/02/19 1212 Oral     SpO2 03/02/19 1212 100 %     Weight 03/02/19 1207 68 kg (150 lb)     Height 03/02/19 1207 1.626 m (5\' 4" )     Head Circumference --      Peak Flow --      Pain Score 03/02/19 1207 10     Pain Loc --      Pain Edu? --      Excl. in [place]? --     Constitutional: Alert and oriented.  Eyes: Conjunctivae are normal.  Mouth/Throat: Mucous membranes are moist. Neck: No stridor.  No meningeal signs.   Cardiovascular: Normal rate, regular rhythm. Good peripheral circulation. Grossly normal heart sounds. Respiratory: Normal respiratory effort.  No retractions. Gastrointestinal: Right upper quadrant tenderness to palpation.  No distention.  Musculoskeletal: No lower extremity tenderness nor edema. No gross deformities of extremities. Neurologic:  Normal speech and language. No gross focal neurologic deficits are appreciated.  Skin:  Skin is warm, dry and intact. Psychiatric: Mood and affect are normal. Speech and behavior are normal.  ____________________________________________   LABS (all labs ordered are listed, but only abnormal results are displayed)  Labs Reviewed  URINALYSIS, COMPLETE  (UACMP) WITH MICROSCOPIC - Abnormal; Notable for the following components:      Result Value   Color, Urine YELLOW (*)    APPearance HAZY (*)    Protein, ur 30 (*)    Leukocytes,Ua TRACE (*)    All other components within normal limits  COMPREHENSIVE METABOLIC PANEL - Abnormal; Notable for the following components:   Glucose, Bld 181 (*)    BUN 25 (*)    AST 137 (*)    ALT 96 (*)    All other components within normal limits  GLUCOSE, CAPILLARY - Abnormal; Notable for the following components:   Glucose-Capillary 169 (*)    All other components within normal limits  CBC WITH DIFFERENTIAL/PLATELET   ____  RADIOLOGY I, [place] N Vash Quezada, personally viewed and evaluated these images (plain radiographs) as part of my medical decision making, as well as reviewing the written report by the radiologist.  ED MD interpretation: CT scan performed yesterday revealed a punctate right renal calculi without any hydronephrosis  moderate amount of stool noted within the colon without any evidence of obstruction.  Official radiology report(s): Ct Renal Stone Study  Result Date: 03/01/2019 CLINICAL DATA:  Right flank pain and microscopic hematuria EXAM: CT ABDOMEN AND PELVIS WITHOUT CONTRAST TECHNIQUE: Multidetector CT imaging of the abdomen and pelvis was performed following the standard protocol without IV contrast. COMPARISON:  CT August 17, 2018 FINDINGS: Lower chest: The visualized heart size within normal limits. No pericardial fluid/thickening. No hiatal hernia. The visualized portions of the lungs are clear. Hepatobiliary: Although limited due to the lack of intravenous contrast, normal in appearance without gross focal abnormality. No evidence of calcified gallstones or biliary ductal dilatation. Pancreas:  Unremarkable.  No surrounding inflammatory changes. Spleen: Normal in size. Although limited due to the lack of intravenous contrast, normal in appearance. Adrenals/Urinary Tract: Both adrenal  glands appear normal. Again noted is punctate calcifications seen within the lower pole of the right kidney as on the prior exam. No left-sided renal calculi are seen. No ureteral or bladder calculi are noted. No hydronephrosis. Stomach/Bowel: The stomach, small bowel, and colon are normal in appearance. No inflammatory changes or obstructive findings. A moderate amount of right colonic stool is present. The appendix is normal in appearance. Vascular/Lymphatic: There are no enlarged abdominal or pelvic lymph nodes. No significant gross vascular findings are present. Reproductive: The prostate is unremarkable. Other: No evidence of abdominal wall mass or hernia. Musculoskeletal: No acute or significant osseous findings. IMPRESSION: Punctate right renal calculi without hydronephrosis. Moderate amount of right colonic stool without evidence of obstruction. Electronically Signed   By: Prudencio Pair M.D.   On: 03/01/2019 22:17     Procedures   ____________________________________________   INITIAL IMPRESSION / MDM / ASSESSMENT AND PLAN / ED COURSE  As part of my medical decision making, I reviewed the following data within the electronic MEDICAL RECORD NUMBER   28 year old male presented with above-stated history and physical exam secondary to right upper quadrant abdominal pain.  Patient's laboratory data markedly unremarkable with with the exception of increased white blood cell count noted in the urine for which patient was prescribed Bactrim last night.  In addition patient's liver enzymes AST 137 ALT 96 known history of hepatitis C.  Patient be referred to GI for further outpatient evaluation. ____________________________________________  FINAL CLINICAL IMPRESSION(S) / ED DIAGNOSES  Final diagnoses:  Right flank pain     MEDICATIONS GIVEN DURING THIS VISIT:  Medications  ketorolac (TORADOL) tablet 10 mg (has no administration in time range)  polyethylene glycol (MIRALAX / GLYCOLAX) packet 17 g  (has no administration in time range)     ED Discharge Orders    None      *Please note:  OAKLEY KOSSMAN was evaluated in Emergency Department on 03/02/2019 for the symptoms described in the history of present illness. He was evaluated in the context of the global COVID-19 pandemic, which necessitated consideration that the patient might be at risk for infection with the SARS-CoV-2 virus that causes COVID-19. Institutional protocols and algorithms that pertain to the evaluation of patients at risk for COVID-19 are in a state of rapid change based on information released by regulatory bodies including the CDC and federal and state organizations. These policies and algorithms were followed during the patient's care in the ED.  Some ED evaluations and interventions may be delayed as a result of limited staffing during the pandemic.*  Note:  This document was prepared using Dragon voice recognition software and may include unintentional  dictation errors.   Darci CurrentBrown, [place] N, MD 03/02/19 1344

## 2019-03-02 NOTE — ED Triage Notes (Signed)
Pt presents to ED via POV with c/o R flank pain, dx with kidney stone last night. Pt A&O x4, NAD noted at this time.

## 2019-03-02 NOTE — ED Notes (Signed)
Pt states his pain is a 10 on 0-10 scale. Pt remains calm and still at this time. Before leaving room pt requests a "[place] tray."

## 2019-03-02 NOTE — ED Notes (Signed)
When this RN went in to discharge patient. IV was no longer in place. Patient states that he removed the IV himself and placed it in the sharps bin. This RN did not visualize where the IV catheter was placed. No bleeding at the site.

## 2019-03-02 NOTE — ED Notes (Signed)
Per Dr. Quentin Cornwall, repeat labwork.

## 2019-03-03 ENCOUNTER — Other Ambulatory Visit: Payer: Medicaid Other

## 2019-03-03 DIAGNOSIS — E1065 Type 1 diabetes mellitus with hyperglycemia: Secondary | ICD-10-CM

## 2019-03-03 DIAGNOSIS — Z8619 Personal history of other infectious and parasitic diseases: Secondary | ICD-10-CM

## 2019-03-03 DIAGNOSIS — Z0000 Encounter for general adult medical examination without abnormal findings: Secondary | ICD-10-CM

## 2019-03-04 LAB — COMPREHENSIVE METABOLIC PANEL
ALT: 88 IU/L — ABNORMAL HIGH (ref 0–44)
AST: 102 IU/L — ABNORMAL HIGH (ref 0–40)
Albumin/Globulin Ratio: 1.8 (ref 1.2–2.2)
Albumin: 4.7 g/dL (ref 4.1–5.2)
Alkaline Phosphatase: 129 IU/L — ABNORMAL HIGH (ref 39–117)
BUN/Creatinine Ratio: 25 — ABNORMAL HIGH (ref 9–20)
BUN: 19 mg/dL (ref 6–20)
Bilirubin Total: 0.4 mg/dL (ref 0.0–1.2)
CO2: 23 mmol/L (ref 20–29)
Calcium: 9.5 mg/dL (ref 8.7–10.2)
Chloride: 102 mmol/L (ref 96–106)
Creatinine, Ser: 0.77 mg/dL (ref 0.76–1.27)
GFR calc Af Amer: 145 mL/min/{1.73_m2} (ref >59)
GFR calc non Af Amer: 125 mL/min/{1.73_m2} (ref >59)
Globulin, Total: 2.6 g/dL (ref 1.5–4.5)
Glucose: 186 mg/dL — ABNORMAL HIGH (ref 65–99)
Potassium: 4.3 mmol/L (ref 3.5–5.2)
Sodium: 139 mmol/L (ref 134–144)
Total Protein: 7.3 g/dL (ref 6.0–8.5)

## 2019-03-04 LAB — MICROALBUMIN / CREATININE URINE RATIO
Creatinine, Urine: 144.9 mg/dL
Microalb/Creat Ratio: 21 mg/g creat (ref 0–29)
Microalbumin, Urine: 30.5 ug/mL

## 2019-03-04 LAB — CBC WITH DIFFERENTIAL/PLATELET
Basophils Absolute: 0.1 10*3/uL (ref 0.0–0.2)
Basos: 1 %
EOS (ABSOLUTE): 0.2 10*3/uL (ref 0.0–0.4)
Eos: 2 %
Hematocrit: 41.2 % (ref 37.5–51.0)
Hemoglobin: 13.5 g/dL (ref 13.0–17.7)
Immature Grans (Abs): 0 10*3/uL (ref 0.0–0.1)
Immature Granulocytes: 0 %
Lymphocytes Absolute: 2.2 10*3/uL (ref 0.7–3.1)
Lymphs: 26 %
MCH: 27.7 pg (ref 26.6–33.0)
MCHC: 32.8 g/dL (ref 31.5–35.7)
MCV: 84 fL (ref 79–97)
Monocytes Absolute: 0.6 10*3/uL (ref 0.1–0.9)
Monocytes: 7 %
Neutrophils Absolute: 5.5 10*3/uL (ref 1.4–7.0)
Neutrophils: 64 %
Platelets: 285 10*3/uL (ref 150–450)
RBC: 4.88 x10E6/uL (ref 4.14–5.80)
RDW: 14 % (ref 11.6–15.4)
WBC: 8.6 10*3/uL (ref 3.4–10.8)

## 2019-03-04 LAB — URINALYSIS
Bilirubin, UA: NEGATIVE
Leukocytes,UA: NEGATIVE
Nitrite, UA: NEGATIVE
Protein,UA: NEGATIVE
RBC, UA: NEGATIVE
Specific Gravity, UA: >=1.03 — AB (ref 1.005–1.030)
Urobilinogen, Ur: 1 mg/dL (ref 0.2–1.0)
pH, UA: 5.5 (ref 5.0–7.5)

## 2019-03-04 LAB — LIPID PANEL
Chol/HDL Ratio: 3.1 ratio (ref 0.0–5.0)
Cholesterol, Total: 90 mg/dL — ABNORMAL LOW (ref 100–199)
HDL: 29 mg/dL — ABNORMAL LOW (ref >39)
LDL Calculated: 49 mg/dL (ref 0–99)
Triglycerides: 60 mg/dL (ref 0–149)
VLDL Cholesterol Cal: 12 mg/dL (ref 5–40)

## 2019-03-04 LAB — HEMOGLOBIN A1C
Est. average glucose Bld gHb Est-mCnc: 252 mg/dL
Hgb A1c MFr Bld: 10.4 % — ABNORMAL HIGH (ref 4.8–5.6)

## 2019-03-04 LAB — TSH: TSH: 1.17 u[IU]/mL (ref 0.450–4.500)

## 2019-03-04 LAB — HEPATITIS PANEL, ACUTE
Hep A IgM: NEGATIVE
Hep B C IgM: NEGATIVE
Hep C Virus Ab: >11 s/co ratio — ABNORMAL HIGH (ref 0.0–0.9)
Hepatitis B Surface Ag: NEGATIVE

## 2019-03-09 ENCOUNTER — Ambulatory Visit: Payer: Medicaid Other | Admitting: Gerontology

## 2019-03-09 ENCOUNTER — Encounter: Payer: Self-pay | Admitting: Gerontology

## 2019-03-09 ENCOUNTER — Other Ambulatory Visit: Payer: Self-pay

## 2019-03-09 VITALS — BP 132/78 | HR 83 | Ht 64.0 in | Wt 139.8 lb

## 2019-03-09 DIAGNOSIS — Z720 Tobacco use: Secondary | ICD-10-CM

## 2019-03-09 DIAGNOSIS — R1011 Right upper quadrant pain: Secondary | ICD-10-CM

## 2019-03-09 DIAGNOSIS — E1065 Type 1 diabetes mellitus with hyperglycemia: Secondary | ICD-10-CM

## 2019-03-09 DIAGNOSIS — G629 Polyneuropathy, unspecified: Secondary | ICD-10-CM

## 2019-03-09 DIAGNOSIS — Z8619 Personal history of other infectious and parasitic diseases: Secondary | ICD-10-CM

## 2019-03-09 MED ORDER — GABAPENTIN 100 MG PO CAPS: 200.0000 mg | ORAL_CAPSULE | Freq: Every day | ORAL | 0 refills | Status: DC

## 2019-03-09 NOTE — Progress Notes (Signed)
Established Patient Office Visit  Subjective:  Patient ID: Trevor Brown, male    DOB: [birth year]  Age: 28 y.o. MRN: [id number]  CC:  Chief Complaint  Patient presents with  . Follow-up    diabetes    HPI [name] presents for follow up of type 1 diabetes.His HgbA1c done 7 days ago was 10.4% and  he is compliant with his medication and continues to check his blood glucose 3-4 times daily. He states that his fasting blood glucose during visit was 250 mg/dl and he didn't bring his log to visit. He reports that he had an episode of hypoglycemia when his fasting blood glucose dropped to 50 mg/dl. He states that he's experiencing intermittent peripheral neuropathy which is bothering him. He is working on adhering to diabetic diet, but drinks two 20 oz of soda.  He was evaluated at the ED on 03/02/19 for right upper quadrant /flank pain. CT abdomen/pelvis was done on 03/01/19 and it showed punctate right renal calculi without hydronephrosis and moderate amount of right colonic stool without evidence of obstruction. He states that he continues to experience sharp, non radiating 9/10 pain to right upper abdominal quadrant while standing. He states that pain has being going on for 2 weeks and he has not taking any medication. He continues on the antibiotic course for white blood cell count in the urine. He states that he has not being treated for hepatitis and his liver function enzymes are elevated, ALT 88 and AST was 102. He denies fever, chills, nausea, vomiting, constipation, diarrhea and no further complaint..  Past Medical History:  Diagnosis Date  . Diabetes mellitus without complication ([place])   . Heart attack ([place])    Pt claims he had heart attack a year ago ( 2015)- and was admitted in [facility] for that, but not given any meds or angiogram, on review of chart- I could not find any details like that.  . Hepatitis C, acute may 2016  . Hypertension     Past Surgical History:  Procedure  Laterality Date  . none      Family History  Problem Relation Age of Onset  . Cirrhosis Mother   . Diabetes Mellitus II Maternal Grandmother   . Diabetes Mellitus II Maternal Grandfather     Social History   Socioeconomic History  . Marital status: Single    Spouse name: Not on file  . Number of children: Not on file  . Years of education: Not on file  . Highest education level: Not on file  Occupational History  . Occupation: tree cutter  Social Needs  . Financial resource strain: Not on file  . Food insecurity    Worry: Not on file    Inability: Not on file  . Transportation needs    Medical: Not on file    Non-medical: Not on file  Tobacco Use  . Smoking status: Current Every Day Smoker    Packs/day: 2.00    Types: Cigarettes  . Smokeless tobacco: Never Used  Substance and Sexual Activity  . Alcohol use: No    Alcohol/week: 0.0 standard drinks  . 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 Sexual activity: Yes  Lifestyle  . Physical activity    Days per week: Not on file    Minutes per session: Not on file  . Stress: Not on file  Relationships  . Social connections  Talks on phone: Not on file    Gets together: Not on file    Attends religious service: Not on file    Active member of club or organization: Not on file    Attends meetings of clubs or organizations: Not on file    Relationship status: Not on file  . Intimate partner violence    Fear of current or ex partner: Not on file    Emotionally abused: Not on file    Physically abused: Not on file    Forced sexual activity: Not on file  Other Topics Concern  . Not on file  Social History Narrative  . Not on file    Outpatient Medications Prior to Visit  Medication Sig Dispense Refill  . acetaminophen (TYLENOL) 500 MG tablet Take 1,000 mg by mouth every 6 (six) hours as needed for pain.    Marland Kitchen. insulin aspart (NOVOLOG) 100 UNIT/ML injection Inject 5-10  Units into the skin See admin instructions. INJECT BEFORE MEALS PER SLIDING SCALE AS DIRECTED: 120-250 = 5 UNITS;251-350 = 8 UNITS; OVER 350 = 10 UNITS 50 mL 2  . insulin aspart protamine- aspart (NOVOLOG MIX 70/30) (70-30) 100 UNIT/ML injection Inject 0.3 mLs (30 Units total) into the skin daily with breakfast. 50 mL 2  . insulin aspart protamine- aspart (NOVOLOG MIX 70/30) (70-30) 100 UNIT/ML injection Inject 0.32 mLs (32 Units total) into the skin daily with supper. 10 mL 2  . sulfamethoxazole-trimethoprim (BACTRIM DS) 800-160 MG tablet Take 1 tablet by mouth 2 (two) times daily. 20 tablet 0   No facility-administered medications prior to visit.     Allergies  Allergen Reactions  . Bee Venom Anaphylaxis  . Penicillins Anaphylaxis, Hives and Other (See Comments)    Has patient had a PCN reaction causing immediate rash, facial/tongue/throat swelling, SOB or lightheadedness with hypotension: Yes Has patient had a PCN reaction causing severe rash involving mucus membranes or skin necrosis: No Has patient had a PCN reaction that required hospitalization No Has patient had a PCN reaction occurring within the last 10 years: Yes If all of the above answers are "NO", then may proceed with Cephalosporin use.  . Ibuprofen Other (See Comments)    Other reaction(s): Other (See Comments) Stomach upset Reaction:  GI upset   . Tramadol Hives and Swelling  . Vancomycin Rash and Other (See Comments)    Reaction:  Red man's syndrome     ROS Review of Systems  Constitutional: Negative.   Eyes: Negative.   Respiratory: Negative.   Cardiovascular: Negative.   Gastrointestinal: Positive for abdominal pain (right upper quadrant pain).  Genitourinary: Negative for difficulty urinating, dysuria, flank pain, frequency, hematuria and urgency.  Musculoskeletal: Negative.   Skin: Negative.   Neurological: Positive for numbness (peripheral neuropathy).  Psychiatric/Behavioral: Negative.       Objective:     Physical Exam  Constitutional: He is oriented to person, place, and time. He appears well-developed.  HENT:  Head: Normocephalic and atraumatic.  Eyes: Pupils are equal, round, and reactive to light. EOM are normal.  Neck: Normal range of motion.  Cardiovascular: Normal rate and regular rhythm.  Pulmonary/Chest: Effort normal and breath sounds normal.  Abdominal: There is abdominal tenderness (right upper quad with palpation).  Musculoskeletal: Normal range of motion.  Neurological: He is alert and oriented to person, place, and time.  Skin: Skin is warm and dry.  Psychiatric: He has a normal mood and affect. His behavior is normal. Judgment and thought content normal.  BP 132/78 (BP Location: Right Arm, Patient Position: Sitting)   Pulse 83   Ht 5\' 4"  (1.626 m)   Wt 139 lb 12.8 oz (63.4 kg)   SpO2 98%   BMI 24.00 kg/m  Wt Readings from Last 3 Encounters:  03/09/19 139 lb 12.8 oz (63.4 kg)  03/02/19 150 lb (68 kg)  03/01/19 150 lb (68 kg)     Health Maintenance Due  Topic Date Due  . PNEUMOCOCCAL POLYSACCHARIDE VACCINE AGE 75-64 HIGH RISK  01/05/1995  . OPHTHALMOLOGY EXAM  01/05/2003  . TETANUS/TDAP  01/05/2012  . INFLUENZA VACCINE  02/20/2019    There are no preventive care reminders to display for this patient.  Lab Results  Component Value Date   TSH 1.170 03/03/2019   Lab Results  Component Value Date   WBC 8.6 03/03/2019   HGB 13.5 03/03/2019   HCT 41.2 03/03/2019   MCV 84 03/03/2019   PLT 285 03/03/2019   Lab Results  Component Value Date   NA 139 03/03/2019   K 4.3 03/03/2019   CO2 23 03/03/2019   GLUCOSE 186 (H) 03/03/2019   BUN 19 03/03/2019   CREATININE 0.77 03/03/2019   BILITOT 0.4 03/03/2019   ALKPHOS 129 (H) 03/03/2019   AST 102 (H) 03/03/2019   ALT 88 (H) 03/03/2019   PROT 7.3 03/03/2019   ALBUMIN 4.7 03/03/2019   CALCIUM 9.5 03/03/2019   ANIONGAP 10 03/02/2019   Lab Results  Component Value Date   CHOL 90 (L) 03/03/2019    Lab Results  Component Value Date   HDL 29 (L) 03/03/2019   Lab Results  Component Value Date   LDLCALC 49 03/03/2019   Lab Results  Component Value Date   TRIG 60 03/03/2019   Lab Results  Component Value Date   CHOLHDL 3.1 03/03/2019   Lab Results  Component Value Date   HGBA1C 10.4 (H) 03/03/2019      Assessment & Plan:    1. Uncontrolled type 1 diabetes mellitus with hyperglycemia (HCC) - Uncontrolled diabetes due to non compliance, his goal HgbA1c was <6%. He will continue on his current treatment regimen. -Use Diabetic diet as advised  -Check blood sugar 2-3 times a day, once before breakfast and others 2 hours after lunch or dinner,write down the numbers against date in a log,bring log to clinic every visit - Increase water intake. -Take medications regularly as advised -Regular exercise  2. Peripheral polyneuropathy - He will continue on gabapentin, he was advised on medication side effects, and to notify clinic. - gabapentin (NEURONTIN) 100 MG capsule; Take 2 capsules (200 mg total) by mouth at bedtime.  Dispense: 60 capsule; Refill: 0  3. Tobacco abuse - He was encouraged on smoking cessation  4. Right upper quadrant abdominal pain - Abdominal pain likely due to hepatitis giving increase liver enzymes. He was encouraged to complete charity care application for - Ambulatory referral to Gastroenterology  5. History of hepatitis C - He was encouraged to complete charity care application for -  Ambulatory referral to Gastroenterology    Follow-up: Return in about 3 weeks (around 03/30/2019), or if symptoms worsen or fail to improve.    Eulogio Requena Trellis PaganiniE Savanah Bayles, NP

## 2019-03-09 NOTE — Patient Instructions (Signed)
Carbohydrate Counting for Diabetes Mellitus, Adult  Carbohydrate counting is a method of keeping track of how many carbohydrates you eat. Eating carbohydrates naturally increases the amount of sugar (glucose) in the blood. Counting how many carbohydrates you eat helps keep your blood glucose within normal limits, which helps you manage your diabetes (diabetes mellitus). It is important to know how many carbohydrates you can safely have in each meal. This is different for every person. A diet and nutrition specialist (registered dietitian) can help you make a meal plan and calculate how many carbohydrates you should have at each meal and snack. Carbohydrates are found in the following foods:  Grains, such as breads and cereals.  Dried beans and soy products.  Starchy vegetables, such as potatoes, peas, and corn.  Fruit and fruit juices.  Milk and yogurt.  Sweets and snack foods, such as cake, cookies, candy, chips, and soft drinks. How do I count carbohydrates? There are two ways to count carbohydrates in food. You can use either of the methods or a combination of both. Reading "Nutrition Facts" on packaged food The "Nutrition Facts" list is included on the labels of almost all packaged foods and beverages in the [place] It includes:  The serving size.  Information about nutrients in each serving, including the grams (g) of carbohydrate per serving. To use the "Nutrition Facts":  Decide how many servings you will have.  Multiply the number of servings by the number of carbohydrates per serving.  The resulting number is the total amount of carbohydrates that you will be having. Learning standard serving sizes of other foods When you eat carbohydrate foods that are not packaged or do not include "Nutrition Facts" on the label, you need to measure the servings in order to count the amount of carbohydrates:  Measure the foods that you will eat with a food scale or measuring cup, if needed.   Decide how many standard-size servings you will eat.  Multiply the number of servings by 15. Most carbohydrate-rich foods have about 15 g of carbohydrates per serving. ? For example, if you eat 8 oz (170 g) of strawberries, you will have eaten 2 servings and 30 g of carbohydrates (2 servings x 15 g = 30 g).  For foods that have more than one food mixed, such as soups and casseroles, you must count the carbohydrates in each food that is included. The following list contains standard serving sizes of common carbohydrate-rich foods. Each of these servings has about 15 g of carbohydrates:   hamburger bun or  English muffin.   oz (15 mL) syrup.   oz (14 g) jelly.  1 slice of bread.  1 six-inch tortilla.  3 oz (85 g) cooked rice or pasta.  4 oz (113 g) cooked dried beans.  4 oz (113 g) starchy vegetable, such as peas, corn, or potatoes.  4 oz (113 g) hot cereal.  4 oz (113 g) mashed potatoes or  of a large baked potato.  4 oz (113 g) canned or frozen fruit.  4 oz (120 mL) fruit juice.  4-6 crackers.  6 chicken nuggets.  6 oz (170 g) unsweetened dry cereal.  6 oz (170 g) plain fat-free yogurt or yogurt sweetened with artificial sweeteners.  8 oz (240 mL) milk.  8 oz (170 g) fresh fruit or one small piece of fruit.  24 oz (680 g) popped popcorn. Example of carbohydrate counting Sample meal  3 oz (85 g) chicken breast.  6 oz (170 g)   brown rice.  4 oz (113 g) corn.  8 oz (240 mL) milk.  8 oz (170 g) strawberries with sugar-free whipped topping. Carbohydrate calculation 1. Identify the foods that contain carbohydrates: ? Rice. ? Corn. ? Milk. ? Strawberries. 2. Calculate how many servings you have of each food: ? 2 servings rice. ? 1 serving corn. ? 1 serving milk. ? 1 serving strawberries. 3. Multiply each number of servings by 15 g: ? 2 servings rice x 15 g = 30 g. ? 1 serving corn x 15 g = 15 g. ? 1 serving milk x 15 g = 15 g. ? 1 serving  strawberries x 15 g = 15 g. 4. Add together all of the amounts to find the total grams of carbohydrates eaten: ? 30 g + 15 g + 15 g + 15 g = 75 g of carbohydrates total. Summary  Carbohydrate counting is a method of keeping track of how many carbohydrates you eat.  Eating carbohydrates naturally increases the amount of sugar (glucose) in the blood.  Counting how many carbohydrates you eat helps keep your blood glucose within normal limits, which helps you manage your diabetes.  A diet and nutrition specialist (registered dietitian) can help you make a meal plan and calculate how many carbohydrates you should have at each meal and snack. This information is not intended to replace advice given to you by your health care provider. Make sure you discuss any questions you have with your health care provider. Document Released: 07/08/2005 Document Revised: 01/30/2017 Document Reviewed: 12/20/2015 Elsevier Patient Education  2020 Elsevier Inc.  

## 2019-03-30 ENCOUNTER — Ambulatory Visit: Payer: Medicaid Other | Admitting: Gerontology

## 2019-03-31 ENCOUNTER — Ambulatory Visit: Payer: Medicaid Other | Admitting: Gastroenterology

## 2019-03-31 ENCOUNTER — Encounter: Payer: Self-pay | Admitting: *Deleted

## 2019-04-01 ENCOUNTER — Other Ambulatory Visit: Payer: Self-pay

## 2019-04-01 ENCOUNTER — Ambulatory Visit: Payer: Medicaid Other | Admitting: Obstetrics and Gynecology

## 2019-04-01 VITALS — BP 104/54 | HR 87 | Temp 98.6°F | Wt 139.0 lb

## 2019-04-01 DIAGNOSIS — G629 Polyneuropathy, unspecified: Secondary | ICD-10-CM

## 2019-04-01 DIAGNOSIS — E1065 Type 1 diabetes mellitus with hyperglycemia: Secondary | ICD-10-CM

## 2019-04-01 DIAGNOSIS — R252 Cramp and spasm: Secondary | ICD-10-CM

## 2019-04-01 MED ORDER — GABAPENTIN 100 MG PO CAPS: 200.0000 mg | ORAL_CAPSULE | Freq: Every day | ORAL | 0 refills | Status: DC

## 2019-04-01 NOTE — Patient Instructions (Signed)
I value your feedback and entrusting us with your care. If you get a [facility] patient survey, I would appreciate you taking the time to let us know about your experience today. Thank you! 

## 2019-04-01 NOTE — Progress Notes (Signed)
[name], Trevor E, NP   Chief Complaint  Patient presents with  . Leg Pain    bilateral lower leg cramp    HPI:      [name] is a 28 y.o. No obstetric history on file. who LMP was No LMP for male patient., presents today for several days of intermittent, alternating bilat LE cramping and pain, happening at rest while trying to sleep. Pt drinks lots of sodas/tea and little water. He hasn't taken any meds for sx. Denies any problems with feet, no erythema, knots/masses. Hx of uncontrolled type 1 DM with HgA1C=10.4 on 03/03/19. Taking insulin and was supposed to have endocrine ref but states he doesn't have appt.  Pt also with peripheral neuropathy and takes gabapentin. Needs Rx RF.    Past Medical History:  Diagnosis Date  . Diabetes mellitus without complication (HCC)   . Heart attack ([facility])    Pt claims he had heart attack a year ago ( 2015)- and was admitted in [facility] for that, but not given any meds or angiogram, on review of chart- I could not find any details like that.  . Hepatitis C, acute may 2016  . Hypertension     Past Surgical History:  Procedure Laterality Date  . none      Family History  Problem Relation Age of Onset  . Cirrhosis Mother   . Diabetes Mellitus II Maternal Grandmother   . Diabetes Mellitus II Maternal Grandfather     Social History   Socioeconomic History  . Marital status: Single    Spouse name: Not on file  . Number of children: Not on file  . Years of education: Not on file  . Highest education level: Not on file  Occupational History  . Occupation: tree cutter  Social Needs  . Financial resource strain: Hard  . Food insecurity    Worry: Never true    Inability: Never true  . Transportation needs    Medical: No    Non-medical: No  Tobacco Use  . Smoking status: Current Every Day Smoker    Packs/day: 2.00    Types: Cigarettes  . Smokeless tobacco: Never Used  Substance and Sexual Activity  . Alcohol use: No     Alcohol/week: 0.0 standard drinks  . 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 Sexual activity: Yes  Lifestyle  . Physical activity    Days per week: 7 days    Minutes per session: 60 min  . Stress: Not on file  Relationships  . Social connections    Talks on phone: More than three times a week    Gets together: More than three times a week    Attends religious service: Never    Active member of club or organization: No    Attends meetings of clubs or organizations: Never    Relationship status: Living with partner  . Intimate partner violence    Fear of current or ex partner: No    Emotionally abused: No    Physically abused: No    Forced sexual activity: No  Other Topics Concern  . Not on file  Social History Narrative  . Not on file    Outpatient Medications Prior to Visit  Medication Sig Dispense Refill  . insulin aspart (NOVOLOG) 100 UNIT/ML injection Inject 5-10 Units into the skin See admin instructions. INJECT BEFORE MEALS  PER SLIDING SCALE AS DIRECTED: 120-250 = 5 UNITS;251-350 = 8 UNITS; OVER 350 = 10 UNITS 50 mL 2  . insulin aspart protamine- aspart (NOVOLOG MIX 70/30) (70-30) 100 UNIT/ML injection Inject 0.3 mLs (30 Units total) into the skin daily with breakfast. 50 mL 2  . insulin aspart protamine- aspart (NOVOLOG MIX 70/30) (70-30) 100 UNIT/ML injection Inject 0.32 mLs (32 Units total) into the skin daily with supper. 10 mL 2  . sulfamethoxazole-trimethoprim (BACTRIM DS) 800-160 MG tablet Take 1 tablet by mouth 2 (two) times daily. 20 tablet 0  . gabapentin (NEURONTIN) 100 MG capsule Take 2 capsules (200 mg total) by mouth at bedtime. 60 capsule 0  . acetaminophen (TYLENOL) 500 MG tablet Take 1,000 mg by mouth every 6 (six) hours as needed for pain.     No facility-administered medications prior to visit.       ROS:  Review of Systems  Constitutional: Negative for fever, malaise/fatigue and weight loss.   HENT: Negative for congestion, ear pain and sinus pain.   Respiratory: Negative for cough, shortness of breath and wheezing.   Cardiovascular: Negative for chest pain, orthopnea and leg swelling.  Gastrointestinal: Negative for constipation, diarrhea, nausea and vomiting.  Genitourinary: Negative for dysuria, frequency, hematuria and urgency.       Breast ROS: negative   Musculoskeletal: Negative for back pain, joint pain and myalgias.  Skin: Negative for itching and rash.  Neurological: Negative for dizziness, tingling, focal weakness and headaches.  Endo/Heme/Allergies: Negative for environmental allergies. Does not bruise/bleed easily.  Psychiatric/Behavioral: Negative for depression and suicidal ideas. The patient is not nervous/anxious and does not have insomnia.     OBJECTIVE:   Vitals:  BP (!) 104/54   Pulse 87   Temp 98.6 F (37 C)   Wt 139 lb (63 kg)   SpO2 98%   BMI 23.86 kg/m   Physical Exam Constitutional:      Appearance: Normal appearance.  Neck:     Musculoskeletal: Normal range of motion.  Pulmonary:     Effort: Pulmonary effort is normal.  Musculoskeletal: Normal range of motion.        General: Tenderness present. No swelling or signs of injury.     Left lower leg: He exhibits tenderness. He exhibits no swelling. No edema.       Legs:  Skin:    General: Skin is warm and dry.     Coloration: Skin is not jaundiced or pale.  Neurological:     General: No focal deficit present.     Mental Status: He is alert and oriented to person, place, and time.     Cranial Nerves: No cranial nerve deficit.  Psychiatric:        Mood and Affect: Mood normal.        Behavior: Behavior normal.    Assessment/Plan: Leg cramping--Could be dehydration vs DM/neuropathy. Increase water/decrease sodas and tea; stretch, check labs. Cont gabapentin. Rx RF  Peripheral polyneuropathy - Plan: Comprehensive metabolic panel, gabapentin (NEURONTIN) 100 MG capsule, Ambulatory  referral to Endocrinology  Uncontrolled type 1 diabetes mellitus with hyperglycemia (HCC) - Plan: Hemoglobin A1c, Ambulatory referral to Endocrinology; pt has insulin refills from 8/20; refer to endocrine   Meds ordered this encounter  Medications  . gabapentin (NEURONTIN) 100 MG capsule    Sig: Take 2 capsules (200 mg total) by mouth at bedtime.    Dispense:  60 capsule    Refill:  0    Order Specific Question:  Supervising Provider    Answer:   Gae Dry [[id number]]      Return if symptoms worsen or fail to improve.  Khaidyn Staebell B. Tanette Chauca, PA-C 04/01/2019 7:52 PM

## 2019-04-11 ENCOUNTER — Emergency Department
Admission: EM | Admit: 2019-04-11 | Discharge: 2019-04-11 | Disposition: A | Discharge status: Home / Self Care | Payer: Self-pay | Attending: Emergency Medicine | Admitting: Emergency Medicine

## 2019-04-11 ENCOUNTER — Emergency Department: Payer: Self-pay

## 2019-04-11 ENCOUNTER — Other Ambulatory Visit: Payer: Self-pay

## 2019-04-11 ENCOUNTER — Encounter: Payer: Self-pay | Admitting: Emergency Medicine

## 2019-04-11 DIAGNOSIS — I10 Essential (primary) hypertension: Secondary | ICD-10-CM | POA: Insufficient documentation

## 2019-04-11 DIAGNOSIS — Z79899 Other long term (current) drug therapy: Secondary | ICD-10-CM | POA: Insufficient documentation

## 2019-04-11 DIAGNOSIS — F17210 Nicotine dependence, cigarettes, uncomplicated: Secondary | ICD-10-CM | POA: Insufficient documentation

## 2019-04-11 DIAGNOSIS — E1165 Type 2 diabetes mellitus with hyperglycemia: Secondary | ICD-10-CM | POA: Insufficient documentation

## 2019-04-11 DIAGNOSIS — R739 Hyperglycemia, unspecified: Secondary | ICD-10-CM

## 2019-04-11 DIAGNOSIS — R0789 Other chest pain: Secondary | ICD-10-CM | POA: Insufficient documentation

## 2019-04-11 DIAGNOSIS — Z794 Long term (current) use of insulin: Secondary | ICD-10-CM | POA: Insufficient documentation

## 2019-04-11 LAB — BASIC METABOLIC PANEL
Anion gap: 12 (ref 5–15)
BUN: 15 mg/dL (ref 6–20)
CO2: 24 mmol/L (ref 22–32)
Calcium: 9.4 mg/dL (ref 8.9–10.3)
Chloride: 97 mmol/L — ABNORMAL LOW (ref 98–111)
Creatinine, Ser: 0.99 mg/dL (ref 0.61–1.24)
GFR calc Af Amer: >60 mL/min (ref >60)
GFR calc non Af Amer: >60 mL/min (ref >60)
Glucose, Bld: 577 mg/dL (ref 70–99)
Potassium: 4.5 mmol/L (ref 3.5–5.1)
Sodium: 133 mmol/L — ABNORMAL LOW (ref 135–145)

## 2019-04-11 LAB — URINALYSIS, COMPLETE (UACMP) WITH MICROSCOPIC
Bacteria, UA: NONE SEEN
Bilirubin Urine: NEGATIVE
Glucose, UA: >=500 mg/dL — AB
Hgb urine dipstick: NEGATIVE
Ketones, ur: 5 mg/dL — AB
Leukocytes,Ua: NEGATIVE
Nitrite: NEGATIVE
Protein, ur: NEGATIVE mg/dL
Specific Gravity, Urine: 1.036 — ABNORMAL HIGH (ref 1.005–1.030)
Squamous Epithelial / HPF: NONE SEEN (ref 0–5)
pH: 6 (ref 5.0–8.0)

## 2019-04-11 LAB — CBC
HCT: 42.6 % (ref 39.0–52.0)
Hemoglobin: 14.1 g/dL (ref 13.0–17.0)
MCH: 28.8 pg (ref 26.0–34.0)
MCHC: 33.1 g/dL (ref 30.0–36.0)
MCV: 87.1 fL (ref 80.0–100.0)
Platelets: 231 10*3/uL (ref 150–400)
RBC: 4.89 MIL/uL (ref 4.22–5.81)
RDW: 12.8 % (ref 11.5–15.5)
WBC: 7.7 10*3/uL (ref 4.0–10.5)
nRBC: 0 % (ref 0.0–0.2)

## 2019-04-11 LAB — GLUCOSE, CAPILLARY
Glucose-Capillary: 568 mg/dL (ref 70–99)
Glucose-Capillary: 465 mg/dL — ABNORMAL HIGH (ref 70–99)
Glucose-Capillary: 217 mg/dL — ABNORMAL HIGH (ref 70–99)

## 2019-04-11 MED ORDER — SODIUM CHLORIDE 0.9 % IV SOLN
1000.0000 mL | Freq: Once | INTRAVENOUS | Status: AC
  Administered 2019-04-11: 1000 mL via INTRAVENOUS

## 2019-04-11 MED ORDER — SODIUM CHLORIDE 0.9 % IV SOLN: 1000.0000 mL | Freq: Once | INTRAVENOUS | Status: DC

## 2019-04-11 MED ORDER — INSULIN ASPART 100 UNIT/ML ~~LOC~~ SOLN
10.0000 [IU] | Freq: Once | SUBCUTANEOUS | Status: AC
  Administered 2019-04-11: 11:00:00 10 [IU] via SUBCUTANEOUS
  Filled 2019-04-11: qty 1

## 2019-04-11 MED ORDER — KETOROLAC TROMETHAMINE 30 MG/ML IJ SOLN
30.0000 mg | Freq: Once | INTRAMUSCULAR | Status: AC
  Administered 2019-04-11: 30 mg via INTRAVENOUS
  Filled 2019-04-11: qty 1

## 2019-04-11 MED ORDER — HYDROCODONE-ACETAMINOPHEN 5-325 MG PO TABS
1.0000 | ORAL_TABLET | Freq: Once | ORAL | Status: AC
  Administered 2019-04-11: 1 via ORAL
  Filled 2019-04-11: qty 1

## 2019-04-11 MED ORDER — INSULIN ASPART 100 UNIT/ML ~~LOC~~ SOLN
5.0000 [IU] | Freq: Once | SUBCUTANEOUS | Status: AC
  Administered 2019-04-11: 5 [IU] via SUBCUTANEOUS
  Filled 2019-04-11: qty 1

## 2019-04-11 MED ORDER — NAPROXEN 500 MG PO TABS: 500.0000 mg | ORAL_TABLET | Freq: Two times a day (BID) | ORAL | 2 refills | Status: DC

## 2019-04-11 NOTE — ED Notes (Addendum)
Pt with c/o pain in right hip, 10/10. Pt does not appear to be in distress, VSS. Pt requesting pain meds, MD notified.

## 2019-04-11 NOTE — ED Notes (Signed)
Date and time results received: 04/11/19 1015 (use smartphrase ".now" to insert current time)  Test: Blood Glucose Critical Value: 577  Name of Provider Notified: Dr Corky Downs  Orders Received? Or Actions Taken?: No new orders at this time.

## 2019-04-11 NOTE — ED Provider Notes (Signed)
[facility] Emergency Department Provider Note   ____________________________________________    I have reviewed the triage vital signs and the nursing notes.   HISTORY  Chief Complaint Hyperglycemia     HPI Trevor Brown is a 28 y.o. male with a history of diabetes who presents with complaints of elevated glucose.  He reports compliance with his medications but reports his glucose has steadily increased.  Denies fevers or chills.  No abdominal pain nausea vomiting.  No cough shortness of breath or dysuria.  Does also complain of some mild chest discomfort over his right breast, no pleurisy or shortness of breath.  No radiation of the pain.  Is not take anything for it.  No injury to the area.  No rash.  Past Medical History:  Diagnosis Date  . Diabetes mellitus without complication (HCC)   . Heart attack (HCC)    Pt claims he had heart attack a year ago ( 2015)- and was admitted in [facility] for that, but not given any meds or angiogram, on review of chart- I could not find any details like that.  . Hepatitis C, acute may 2016  . Hypertension     Patient Active Problem List   Diagnosis Date Noted  . History of hepatitis C 02/25/2019  . Health care maintenance 02/25/2019  . Peripheral neuropathy 02/25/2019  . Hand abscess 10/13/2018  . IVDU (intravenous drug user) 10/11/2018  . Hyperkalemia 10/11/2018  . Cellulitis and abscess of hand 08/17/2018  . Abdominal pain 06/29/2017  . Hyponatremia 06/29/2017  . DKA (diabetic ketoacidosis) (HCC) 06/29/2017  . Dental erosion extending into pulp 12/31/2016  . 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 Elevated transaminase level 03/27/2016  . Diabetes mellitus type 1 (HCC) 03/27/2016  . Tobacco abuse counseling 03/27/2016  . Transaminitis 03/25/2016  . Tobacco abuse 01/10/2016  . Type 1 diabetes mellitus with hyperglycemia (HCC) 12/12/2015  . Cocaine abuse (HCC) 12/12/2015  . DKA (diabetic ketoacidoses) (HCC) 06/10/2015   . Hidradenitis suppurativa of left axilla   . DKA, type 1 (HCC) 06/06/2015  . Malnutrition of moderate degree (HCC) 04/08/2015  . Hepatitis C 12/13/2014  . Diabetes type 1, uncontrolled (HCC) 12/11/2014  . Major depressive disorder, single episode, mild (HCC)   . Major depression, single episode 12/10/2014  . Diabetes mellitus type 1, uncontrolled (HCC) 12/09/2014  . Homelessness 12/09/2014    Past Surgical History:  Procedure Laterality Date  . none      Prior to Admission medications   Medication Sig Start Date End Date Taking? Authorizing Provider  acetaminophen (TYLENOL) 500 MG tablet Take 1,000 mg by mouth every 6 (six) hours as needed for pain. 06/17/18 06/17/19 Yes [provider]  gabapentin (NEURONTIN) 100 MG capsule Take 2 capsules (200 mg total) by mouth at bedtime. 04/01/19  Yes Copland, Helmut Muster B, PA-C  insulin aspart (NOVOLOG) 100 UNIT/ML injection Inject 5-10 Units into the skin See admin instructions. INJECT BEFORE MEALS PER SLIDING SCALE AS DIRECTED: 120-250 = 5 UNITS;251-350 = 8 UNITS; OVER 350 = 10 UNITS 02/25/19  Yes Iloabachie, [name], NP  insulin aspart protamine- aspart (NOVOLOG MIX 70/30) (70-30) 100 UNIT/ML injection Inject 0.3 mLs (30 Units total) into the skin daily with breakfast. 02/25/19  Yes Iloabachie, [name], NP  insulin aspart protamine- aspart (NOVOLOG MIX 70/30) (70-30) 100 UNIT/ML injection Inject 0.32 mLs (32 Units total) into the skin daily with supper. 02/25/19  Yes Iloabachie, [name], NP  naproxen (NAPROSYN) 500 MG tablet Take 1  tablet (500 mg total) by mouth 2 (two) times daily with a meal. 04/11/19   Jene EveryKinner, Meshia Rau, MD  sulfamethoxazole-trimethoprim (BACTRIM DS) 800-160 MG tablet Take 1 tablet by mouth 2 (two) times daily. Patient not taking: Reported on 04/11/2019 03/01/19   Arnaldo NatalMalinda, Paul F, MD     Allergies Bee venom, Penicillins, Ibuprofen, Tramadol, and Vancomycin  Family History  Problem Relation Age of Onset  . Cirrhosis Mother    . Diabetes Mellitus II Maternal Grandmother   . Diabetes Mellitus II Maternal Grandfather     Social History Social History   Tobacco Use  . Smoking status: Current Every Day Smoker    Packs/day: 2.00    Types: Cigarettes  . Smokeless tobacco: Never Used  Substance Use Topics  . Alcohol use: No    Alcohol/week: 0.0 standard drinks  . Drug use: Not Currently    Types: IV, Heroin    Comment: heroin- last used October 04 2018    Review of Systems  Constitutional: No fever/chills Eyes: No visual changes.  ENT: Polydipsia Cardiovascular: As above Respiratory: Denies shortness of breath. Gastrointestinal: No abdominal pain.  No nausea, no vomiting.   Genitourinary: Polyuria Musculoskeletal: Negative for back pain. Skin: Negative for rash. Neurological: Negative for headaches   ____________________________________________   PHYSICAL EXAM:  VITAL SIGNS: ED Triage Vitals  Enc Vitals Group     BP 04/11/19 0940 136/83     Pulse Rate 04/11/19 0940 (!) 105     Resp 04/11/19 0940 16     Temp 04/11/19 0940 98.1 F (36.7 C)     Temp Source 04/11/19 0940 Oral     SpO2 04/11/19 0940 98 %     Weight 04/11/19 1227 63.5 kg (140 lb)     Height 04/11/19 1227 1.626 m (5\' 4" )     Head Circumference --      Peak Flow --      Pain Score 04/11/19 0939 10     Pain Loc --      Pain Edu? --      Excl. in GC? --     Constitutional: Alert and oriented. No acute distress.   Nose: No congestion/rhinnorhea. Mouth/Throat: Mucous membranes are moist.    Cardiovascular: Normal rate, regular rhythm. Grossly normal heart sounds.  Good peripheral circulation.  Patient has point tenderness overlying the right lateral breast, no rash bony abnormalities or erythema, no swelling or bruising. Respiratory: Normal respiratory effort.  No retractions. Lungs CTAB. Gastrointestinal: Soft and nontender. No distention.  No CVA tenderness.  Musculoskeletal: Warm and well perfused Neurologic:  Normal  speech and language. No gross focal neurologic deficits are appreciated.  Skin:  Skin is warm, dry and intact. No rash noted. Psychiatric: Mood and affect are normal. Speech and behavior are normal.  ____________________________________________   LABS (all labs ordered are listed, but only abnormal results are displayed)  Labs Reviewed  BASIC METABOLIC PANEL - Abnormal; Notable for the following components:      Result Value   Sodium 133 (*)    Chloride 97 (*)    Glucose, Bld 577 (*)    All other components within normal limits  URINALYSIS, COMPLETE (UACMP) WITH MICROSCOPIC - Abnormal; Notable for the following components:   Color, Urine YELLOW (*)    APPearance CLEAR (*)    Specific Gravity, Urine 1.036 (*)    Glucose, UA >=500 (*)    Ketones, ur 5 (*)    All other components within normal limits  GLUCOSE, CAPILLARY -  Abnormal; Notable for the following components:   Glucose-Capillary 568 (*)    All other components within normal limits  GLUCOSE, CAPILLARY - Abnormal; Notable for the following components:   Glucose-Capillary 465 (*)    All other components within normal limits  GLUCOSE, CAPILLARY - Abnormal; Notable for the following components:   Glucose-Capillary 217 (*)    All other components within normal limits  CBC  CBG MONITORING, ED   ____________________________________________  EKG  ED ECG REPORT I, Lavonia Drafts, the attending physician, personally viewed and interpreted this ECG.  Date: 04/11/2019  Rhythm: normal sinus rhythm QRS Axis: normal Intervals: normal ST/T Wave abnormalities: normal Narrative Interpretation: no evidence of acute ischemia  ____________________________________________  RADIOLOGY  Chest x-ray unremarkable ____________________________________________   PROCEDURES  Procedure(s) performed: No  Procedures   Critical Care performed: No ____________________________________________   INITIAL IMPRESSION / ASSESSMENT AND  PLAN / ED COURSE  Pertinent labs & imaging results that were available during my care of the patient were reviewed by me and considered in my medical decision making (see chart for details).  Patient presents with with primary complaint of hyperglycemia.  Glucose on labs here 568, we will give IV fluids, subcu insulin check labs to rule out DKA.  Treated with IM Toradol for chest wall pain.  Blood work is reassuring, normal anion gap.  Patient given fluids and insulin  Glucose has improved to 217   ____________________________________________   FINAL CLINICAL IMPRESSION(S) / ED DIAGNOSES  Final diagnoses:  Hyperglycemia  Chest wall pain        Note:  This document was prepared using Dragon voice recognition software and may include unintentional dictation errors.   Lavonia Drafts, MD 04/11/19 [phone]

## 2019-04-11 NOTE — ED Triage Notes (Signed)
Pt to ED via POV stating that his sugar is high. Pt state that he has not been able to check it but he has been urinating a lot and feels like he is burning up. Pt also states that he is having pain in the right rib area. Pt denies any known injury. Pt is not having shortness of breath. Pt is in NAD at this time.

## 2019-04-11 NOTE — ED Notes (Signed)
bg 465

## 2019-04-12 ENCOUNTER — Encounter: Payer: Self-pay | Admitting: Pharmacist

## 2019-04-20 ENCOUNTER — Telehealth: Payer: Self-pay | Admitting: Gerontology

## 2019-04-20 NOTE — Telephone Encounter (Signed)
Called pt on 9/29 @3 :14 pm to schedule endocrinology appointment. Appointment scheduled for 11/17.

## 2019-05-11 ENCOUNTER — Ambulatory Visit: Payer: Medicaid Other | Admitting: Gerontology

## 2019-06-08 ENCOUNTER — Other Ambulatory Visit: Payer: Self-pay

## 2019-06-08 ENCOUNTER — Emergency Department
Admission: EM | Admit: 2019-06-08 | Discharge: 2019-06-08 | Disposition: A | Discharge status: Home / Self Care | Payer: Medicaid Other | Attending: Emergency Medicine | Admitting: Emergency Medicine

## 2019-06-08 ENCOUNTER — Ambulatory Visit: Payer: Medicaid Other

## 2019-06-08 DIAGNOSIS — Z20828 Contact with and (suspected) exposure to other viral communicable diseases: Secondary | ICD-10-CM | POA: Insufficient documentation

## 2019-06-08 DIAGNOSIS — F1190 Opioid use, unspecified, uncomplicated: Secondary | ICD-10-CM | POA: Insufficient documentation

## 2019-06-08 DIAGNOSIS — Z79899 Other long term (current) drug therapy: Secondary | ICD-10-CM | POA: Insufficient documentation

## 2019-06-08 DIAGNOSIS — Z794 Long term (current) use of insulin: Secondary | ICD-10-CM | POA: Insufficient documentation

## 2019-06-08 DIAGNOSIS — R4182 Altered mental status, unspecified: Secondary | ICD-10-CM | POA: Insufficient documentation

## 2019-06-08 DIAGNOSIS — I10 Essential (primary) hypertension: Secondary | ICD-10-CM | POA: Insufficient documentation

## 2019-06-08 DIAGNOSIS — E10649 Type 1 diabetes mellitus with hypoglycemia without coma: Secondary | ICD-10-CM | POA: Insufficient documentation

## 2019-06-08 DIAGNOSIS — F17210 Nicotine dependence, cigarettes, uncomplicated: Secondary | ICD-10-CM | POA: Insufficient documentation

## 2019-06-08 DIAGNOSIS — I252 Old myocardial infarction: Secondary | ICD-10-CM | POA: Insufficient documentation

## 2019-06-08 DIAGNOSIS — R1110 Vomiting, unspecified: Secondary | ICD-10-CM | POA: Insufficient documentation

## 2019-06-08 HISTORY — DX: Patient's noncompliance with other medical treatment and regimen due to unspecified reason: Z91.199

## 2019-06-08 HISTORY — DX: Opioid abuse, uncomplicated: F11.10

## 2019-06-08 HISTORY — DX: Patient's noncompliance with other medical treatment and regimen: Z91.19

## 2019-06-08 HISTORY — DX: Type 1 diabetes mellitus without complications: E10.9

## 2019-06-08 LAB — CBC WITH DIFFERENTIAL/PLATELET
Abs Immature Granulocytes: 0.05 10*3/uL (ref 0.00–0.07)
Basophils Absolute: 0.1 10*3/uL (ref 0.0–0.1)
Basophils Relative: 1 %
Eosinophils Absolute: 0.2 10*3/uL (ref 0.0–0.5)
Eosinophils Relative: 1 %
HCT: 44 % (ref 39.0–52.0)
Hemoglobin: 14.1 g/dL (ref 13.0–17.0)
Immature Granulocytes: 0 %
Lymphocytes Relative: 31 %
Lymphs Abs: 4.7 10*3/uL — ABNORMAL HIGH (ref 0.7–4.0)
MCH: 28.3 pg (ref 26.0–34.0)
MCHC: 32 g/dL (ref 30.0–36.0)
MCV: 88.2 fL (ref 80.0–100.0)
Monocytes Absolute: 2.8 10*3/uL — ABNORMAL HIGH (ref 0.1–1.0)
Monocytes Relative: 19 %
Neutro Abs: 7.2 10*3/uL (ref 1.7–7.7)
Neutrophils Relative %: 48 %
Platelets: 295 10*3/uL (ref 150–400)
RBC: 4.99 MIL/uL (ref 4.22–5.81)
RDW: 14.5 % (ref 11.5–15.5)
WBC: 15.1 10*3/uL — ABNORMAL HIGH (ref 4.0–10.5)
nRBC: 0 % (ref 0.0–0.2)

## 2019-06-08 LAB — URINE DRUG SCREEN, QUALITATIVE (ARMC ONLY)
Amphetamines, Ur Screen: NOT DETECTED
Barbiturates, Ur Screen: NOT DETECTED
Benzodiazepine, Ur Scrn: NOT DETECTED
Cannabinoid 50 Ng, Ur ~~LOC~~: POSITIVE — AB
Cocaine Metabolite,Ur ~~LOC~~: NOT DETECTED
MDMA (Ecstasy)Ur Screen: NOT DETECTED
Methadone Scn, Ur: NOT DETECTED
Opiate, Ur Screen: POSITIVE — AB
Phencyclidine (PCP) Ur S: NOT DETECTED
Tricyclic, Ur Screen: NOT DETECTED

## 2019-06-08 LAB — COMPREHENSIVE METABOLIC PANEL
ALT: 58 U/L — ABNORMAL HIGH (ref 0–44)
AST: 69 U/L — ABNORMAL HIGH (ref 15–41)
Albumin: 5.1 g/dL — ABNORMAL HIGH (ref 3.5–5.0)
Alkaline Phosphatase: 111 U/L (ref 38–126)
Anion gap: 18 — ABNORMAL HIGH (ref 5–15)
BUN: 24 mg/dL — ABNORMAL HIGH (ref 6–20)
CO2: 20 mmol/L — ABNORMAL LOW (ref 22–32)
Calcium: 9.5 mg/dL (ref 8.9–10.3)
Chloride: 102 mmol/L (ref 98–111)
Creatinine, Ser: 1.15 mg/dL (ref 0.61–1.24)
GFR calc Af Amer: >60 mL/min (ref >60)
GFR calc non Af Amer: >60 mL/min (ref >60)
Glucose, Bld: 57 mg/dL — ABNORMAL LOW (ref 70–99)
Potassium: 3.6 mmol/L (ref 3.5–5.1)
Sodium: 140 mmol/L (ref 135–145)
Total Bilirubin: 0.7 mg/dL (ref 0.3–1.2)
Total Protein: 8.8 g/dL — ABNORMAL HIGH (ref 6.5–8.1)

## 2019-06-08 LAB — URINALYSIS, COMPLETE (UACMP) WITH MICROSCOPIC
Bacteria, UA: NONE SEEN
Bilirubin Urine: NEGATIVE
Glucose, UA: 150 mg/dL — AB
Hgb urine dipstick: NEGATIVE
Ketones, ur: 5 mg/dL — AB
Leukocytes,Ua: NEGATIVE
Nitrite: NEGATIVE
Protein, ur: 30 mg/dL — AB
Specific Gravity, Urine: 1.029 (ref 1.005–1.030)
Squamous Epithelial / HPF: NONE SEEN (ref 0–5)
pH: 5 (ref 5.0–8.0)

## 2019-06-08 LAB — GLUCOSE, CAPILLARY
Glucose-Capillary: 72 mg/dL (ref 70–99)
Glucose-Capillary: 141 mg/dL — ABNORMAL HIGH (ref 70–99)
Glucose-Capillary: 36 mg/dL — CL (ref 70–99)
Glucose-Capillary: 120 mg/dL — ABNORMAL HIGH (ref 70–99)

## 2019-06-08 LAB — LIPASE, BLOOD: Lipase: 15 U/L (ref 11–51)

## 2019-06-08 LAB — PROTIME-INR
INR: 1.1 (ref 0.8–1.2)
Prothrombin Time: 14.3 seconds (ref 11.4–15.2)

## 2019-06-08 LAB — LACTIC ACID, PLASMA
Lactic Acid, Venous: 6.5 mmol/L (ref 0.5–1.9)
Lactic Acid, Venous: 0.8 mmol/L (ref 0.5–1.9)

## 2019-06-08 LAB — ACETAMINOPHEN LEVEL: Acetaminophen (Tylenol), Serum: 11 ug/mL (ref 10–30)

## 2019-06-08 LAB — SARS CORONAVIRUS 2 (TAT 6-24 HRS): SARS Coronavirus 2: NEGATIVE

## 2019-06-08 LAB — MAGNESIUM: Magnesium: 2.4 mg/dL (ref 1.7–2.4)

## 2019-06-08 LAB — ETHANOL: Alcohol, Ethyl (B): <10 mg/dL (ref <10)

## 2019-06-08 LAB — SALICYLATE LEVEL: Salicylate Lvl: 12.9 mg/dL (ref 2.8–30.0)

## 2019-06-08 MED ORDER — ONDANSETRON HCL 4 MG/2ML IJ SOLN
4.0000 mg | INTRAMUSCULAR | Status: AC
  Administered 2019-06-08: 4 mg via INTRAVENOUS

## 2019-06-08 MED ORDER — KETOROLAC TROMETHAMINE 30 MG/ML IJ SOLN
15.0000 mg | Freq: Once | INTRAMUSCULAR | Status: AC
  Administered 2019-06-08: 15 mg via INTRAVENOUS
  Filled 2019-06-08: qty 1

## 2019-06-08 MED ORDER — FREESTYLE LITE TEST VI STRP: ORAL_STRIP | 5 refills | Status: DC

## 2019-06-08 MED ORDER — DEXTROSE 50 % IV SOLN
12.5000 g | Freq: Once | INTRAVENOUS | Status: AC
  Administered 2019-06-08: 12.5 g via INTRAVENOUS
  Filled 2019-06-08: qty 50

## 2019-06-08 MED ORDER — DEXTROSE-NACL 5-0.9 % IV SOLN
INTRAVENOUS | Status: DC
  Administered 2019-06-08: 500 mL via INTRAVENOUS

## 2019-06-08 MED ORDER — SODIUM CHLORIDE 0.9 % IV BOLUS
1000.0000 mL | Freq: Once | INTRAVENOUS | Status: AC
  Administered 2019-06-08: 1000 mL via INTRAVENOUS

## 2019-06-08 MED ORDER — DROPERIDOL 2.5 MG/ML IJ SOLN: 2.5000 mg | Freq: Once | INTRAMUSCULAR | Status: DC | PRN

## 2019-06-08 MED ORDER — ONDANSETRON HCL 4 MG/2ML IJ SOLN
4.0000 mg | Freq: Once | INTRAMUSCULAR | Status: AC
  Administered 2019-06-08: 4 mg via INTRAVENOUS
  Filled 2019-06-08: qty 2

## 2019-06-08 MED ORDER — ONDANSETRON 4 MG PO TBDP: 4.0000 mg | ORAL_TABLET | Freq: Three times a day (TID) | ORAL | 0 refills | Status: DC | PRN

## 2019-06-08 NOTE — ED Triage Notes (Signed)
Pt to the er for hypoglycemia. Pt in with ems, pt unconscious at home for 20 minutes before roommates called 911, first responder initial blood sugar was 27, pt given 2 mg glucagon IM. Pt then arousalable and profusely vomited. Pt is cold to the touch. EMS states pt was not outside. Repeat blood sugar 78. Pt unable to answer questions and pt jaw is tight and teeth clenched. Pt shivering.

## 2019-06-08 NOTE — ED Notes (Signed)
Patient dry heaving. Report severe nausea. See MAR.

## 2019-06-08 NOTE — ED Provider Notes (Signed)
[facility] Emergency Department Provider Note  ____________________________________________   First MD Initiated Contact with Patient 06/08/19 0310     (approximate)  I have reviewed the triage vital signs and the nursing notes.   HISTORY  Chief Complaint Hypoglycemia  Level 5 caveat:  history/ROS limited by acute/critical illness  HPI Trevor Brown is a 28 y.o. male with a medical history as listed below was notably includes type 1 diabetes with prior episodes of DKA as well as heroin abuse.  He presents by EMS as emergency traffic for evaluation of unresponsiveness, vomiting, and hypoglycemia.  The patient reportedly lives with multiple men of similar age and a relatively rundown and dirty environment.  The history as provided to the paramedics was that the patient took his insulin tonight and did not eat much dinner.  He was unresponsive for 20 minutes before anyone called EMS.  When the paramedics arrived he was shivering violently and saying that he was cold but apparently he was inside the house.  He had a blood sugar in the 20s and they provided glucagon 1 mg intramuscular.  They provided a second glucagon 1 mg intramuscular.  In route to the hospital the patient had 2 explosive episodes of emesis.  Upon arrival to the ED he is minimally conversant, awake, and shattering violently and will only say that he is cold.        Past Medical History:  Diagnosis Date   Heart attack (HCC)    Pt claims he had heart attack a year ago ( 2015)- and was admitted in [facility] for that, but not given any meds or angiogram, on review of chart- I could not find any details like that.   Hepatitis C, acute may 2016   Heroin abuse (HCC)    History of noncompliance with medical treatment    Hypertension    Type 1 diabetes [facility])     Patient Active Problem List   Diagnosis Date Noted   History of hepatitis C 02/25/2019   Health care maintenance 02/25/2019    Peripheral neuropathy 02/25/2019   Hand abscess 10/13/2018   IVDU (intravenous drug user) 10/11/2018   Hyperkalemia 10/11/2018   Cellulitis and abscess of hand 08/17/2018   Abdominal pain 06/29/2017   Hyponatremia 06/29/2017   DKA (diabetic ketoacidosis) (HCC) 06/29/2017   Dental erosion extending into pulp 12/31/2016   MRSA carrier 07/05/2016   Elevated transaminase level 03/27/2016   Diabetes mellitus type 1 (HCC) 03/27/2016   Tobacco abuse counseling 03/27/2016   Transaminitis 03/25/2016   Tobacco abuse 01/10/2016   Type 1 diabetes mellitus with hyperglycemia (HCC) 12/12/2015   Cocaine abuse (HCC) 12/12/2015   DKA (diabetic ketoacidoses) (HCC) 06/10/2015   Hidradenitis suppurativa of left axilla    DKA, type 1 (HCC) 06/06/2015   Malnutrition of moderate degree (HCC) 04/08/2015   Hepatitis C 12/13/2014   Diabetes type 1, uncontrolled (HCC) 12/11/2014   Major depressive disorder, single episode, mild (HCC)    Major depression, single episode 12/10/2014   Diabetes mellitus type 1, uncontrolled (HCC) 12/09/2014   Homelessness 12/09/2014    Past Surgical History:  Procedure Laterality Date   none      Prior to Admission medications   Medication Sig Start Date End Date Taking? Authorizing Provider  acetaminophen (TYLENOL) 500 MG tablet Take 1,000 mg by mouth every 6 (six) hours as needed for pain. 06/17/18 06/17/19  [provider]  gabapentin (NEURONTIN) 100 MG capsule Take 2 capsules (200 mg total)  by mouth at bedtime. 0/62/37   Copland, Elmo Putt B, PA-C  insulin aspart (NOVOLOG) 100 UNIT/ML injection Inject 5-10 Units into the skin See admin instructions. INJECT BEFORE MEALS PER SLIDING SCALE AS DIRECTED: 120-250 = 5 UNITS;251-350 = 8 UNITS; OVER 350 = 10 UNITS 02/25/19   Iloabachie, [name], NP  insulin aspart protamine- aspart (NOVOLOG MIX 70/30) (70-30) 100 UNIT/ML injection Inject 0.3 mLs (30 Units total) into the skin daily with breakfast.  02/25/19   Iloabachie, [name], NP  insulin aspart protamine- aspart (NOVOLOG MIX 70/30) (70-30) 100 UNIT/ML injection Inject 0.32 mLs (32 Units total) into the skin daily with supper. 02/25/19   Iloabachie, [name], NP  naproxen (NAPROSYN) 500 MG tablet Take 1 tablet (500 mg total) by mouth 2 (two) times daily with a meal. 04/11/19   Lavonia Drafts, MD  sulfamethoxazole-trimethoprim (BACTRIM DS) 800-160 MG tablet Take 1 tablet by mouth 2 (two) times daily. Patient not taking: Reported on 04/11/2019 03/01/19   Nena Polio, MD    Allergies Bee venom, Penicillins, Ibuprofen, Tramadol, and Vancomycin  Family History  Problem Relation Age of Onset   Cirrhosis Mother    Diabetes Mellitus II Maternal Grandmother    Diabetes Mellitus II Maternal Grandfather     Social History Social History   Tobacco Use   Smoking status: Current Every Day Smoker    Packs/day: 2.00    Types: Cigarettes   Smokeless tobacco: Never Used  Substance Use Topics   Alcohol use: No    Alcohol/week: 0.0 standard drinks   Drug use: Not Currently    Types: IV, Heroin    Comment: heroin- last used October 04 2018    Review of Systems Level 5 caveat:  history/ROS limited by acute/critical illness ____________________________________________   PHYSICAL EXAM:  VITAL SIGNS: ED Triage Vitals  Enc Vitals Group     BP 06/08/19 0315 (!) 156/111     Pulse Rate 06/08/19 0315 66     Resp 06/08/19 0315 (!) 21     Temp 06/08/19 0315 (!) 94 F (34.4 C)     Temp Source 06/08/19 0315 Rectal     SpO2 --      Weight 06/08/19 0316 61.2 kg (135 lb)     Height 06/08/19 0316 1.727 m (5\' 8" )     Head Circumference --      Peak Flow --      Pain Score 06/08/19 0315 0     Pain Loc --      Pain Edu? --      Excl. in [place]? --     Constitutional: Awake, minimally responsive to instructions, shattering violently and saying that he is cold. Eyes: Conjunctivae are normal.  Pupils are normal at this time. Head:  Atraumatic. Nose: No congestion/rhinnorhea. Mouth/Throat: Patient has clearly just recently vomited with a great deal of emesis on and around his mouth and body. Neck: No stridor.  No meningeal signs.   Cardiovascular: Normal rate, regular rhythm. Good peripheral circulation. Grossly normal heart sounds. Respiratory: Tachypnea without retractions or accessory muscle usage. Gastrointestinal: Muscular body habitus.  Soft and nontender. No distention.  Musculoskeletal: No lower extremity tenderness nor edema. No gross deformities of extremities. Neurologic: The patient is shuttering violently and saying that he is cold.  No evidence of any focal neurological deficits at this time. Skin:  Skin is dry and intact but cool to the touch.  Track marks are present on the patient's arms.   ____________________________________________   Reva Bores (  all labs ordered are listed, but only abnormal results are displayed)  Labs Reviewed  COMPREHENSIVE METABOLIC PANEL - Abnormal; Notable for the following components:      Result Value   CO2 20 (*)    Glucose, Bld 57 (*)    BUN 24 (*)    Total Protein 8.8 (*)    Albumin 5.1 (*)    AST 69 (*)    ALT 58 (*)    Anion gap 18 (*)    All other components within normal limits  LACTIC ACID, PLASMA - Abnormal; Notable for the following components:   Lactic Acid, Venous 6.5 (*)    All other components within normal limits  CBC WITH DIFFERENTIAL/PLATELET - Abnormal; Notable for the following components:   WBC 15.1 (*)    Lymphs Abs 4.7 (*)    Monocytes Absolute 2.8 (*)    All other components within normal limits  URINALYSIS, COMPLETE (UACMP) WITH MICROSCOPIC - Abnormal; Notable for the following components:   Color, Urine YELLOW (*)    APPearance CLEAR (*)    Glucose, UA 150 (*)    Ketones, ur 5 (*)    Protein, ur 30 (*)    All other components within normal limits  URINE DRUG SCREEN, QUALITATIVE ([facility] ONLY) - Abnormal; Notable for the following components:    Opiate, Ur Screen POSITIVE (*)    Cannabinoid 50 Ng, Ur [place] POSITIVE (*)    All other components within normal limits  GLUCOSE, CAPILLARY - Abnormal; Notable for the following components:   Glucose-Capillary 141 (*)    All other components within normal limits  SARS CORONAVIRUS 2 (TAT 6-24 HRS)  LIPASE, BLOOD  LACTIC ACID, PLASMA  MAGNESIUM  GLUCOSE, CAPILLARY  ACETAMINOPHEN LEVEL  ETHANOL  PROTIME-INR  SALICYLATE LEVEL  CBG MONITORING, ED  CBG MONITORING, ED   ____________________________________________  EKG  ED ECG REPORT I, Loleta Rose, the attending physician, personally viewed and interpreted this ECG.  Date: 06/08/2019 EKG Time: 3:14 AM Rate: 72 Rhythm: normal sinus rhythm with occasional PVC QRS Axis: normal Intervals: Short PR interval of 91 ms, probable left ventricular hypertrophy, QTC of 429 ms ST/T Wave abnormalities: Non-specific ST segment / T-wave changes, but no clear evidence of acute ischemia. Narrative Interpretation: no definitive evidence of acute ischemia; does not meet STEMI criteria.   ____________________________________________  RADIOLOGY I, Loleta Rose, personally viewed and evaluated these images (plain radiographs) as part of my medical decision making, as well as reviewing the written report by the radiologist.  ED MD interpretation:  No indication for emergent imaging  Official radiology report(s): No results found.  ____________________________________________   PROCEDURES   Procedure(s) performed (including Critical Care):  .Critical Care Performed by: Loleta Rose, MD Authorized by: Loleta Rose, MD   Critical care provider statement:    Critical care time (minutes):  30   Critical care time was exclusive of:  Separately billable procedures and treating other patients   Critical care was necessary to treat or prevent imminent or life-threatening deterioration of the following conditions:  Metabolic crisis and  toxidrome   Critical care was time spent personally by me on the following activities:  Development of treatment plan with patient or surrogate, discussions with consultants, evaluation of patient's response to treatment, examination of patient, obtaining history from patient or surrogate, ordering and performing treatments and interventions, ordering and review of laboratory studies, ordering and review of radiographic studies, pulse oximetry, re-evaluation of patient's condition and review of old charts  ____________________________________________   INITIAL IMPRESSION / MDM / ASSESSMENT AND PLAN / ED COURSE  As part of my medical decision making, I reviewed the following data within the electronic MEDICAL RECORD NUMBER History obtained from family, Nursing notes reviewed and incorporated, Labs reviewed , Old chart reviewed, Notes from prior ED visits and [place] Controlled Substance Database   Differential diagnosis includes, but is not limited to, drug overdose, hypoglycemia, DKA, acute infection including COVID-19, aspiration, metabolic or electrolyte abnormality.  The history provided did not make too much sense but then I looked at the patient's chart and saw that he has a history of heroin abuse.  Based on the history of him going "unresponsive for 20 minutes" and then his housemates calling EMS, I suspect that he shot up with heroin tonight and overdosed, particularly if the other document history is true that he has not been using regularly since earlier this year.  I suspect that his housemates give him some Narcan and then called EMS.  The reason he was vomiting for EMS was likely a result of the Narcan in the setting of heroin abuse, in addition to the hypoglycemia which could have resulted from the use of his insulin, not eating, drug use, etc. after the glucagon he received in route from EMS his blood sugar was in the 70s and I ordered 1 ampoule of D50 followed by D5 normal saline infusion at  125 mL/h.  We will monitor his blood sugar carefully.  Ordered Zofran 4 g IV and I have ordered droperidol 2.5 mg IV in case he is increasingly agitated or if he has intractable nausea or vomiting.      Clinical Course as of Jun 08 655  Tue Jun 08, 2019  0327 Rectal temp is 51, starting Bair hugger   [CF]  [phone] Patient's lactic acid is 6.5 which I suspect is secondary to seizure-like activity or the probable overdose.  There is no reason at this point to think that he is septic. The patient is noted to have a lactate>4. With the current information available to me, I don't think the patient is in septic shock. The lactate>4, is related to drug / toxin overdose and/or seizure activity.  In addition to the infusion of D5 normal saline, I have ordered 1 L normal saline and we will recheck a lactic after the bolus and I suspect it will come down substantially.  Lab results are also notable for a leukocytosis of 15.1 which is not surprising given his current presentation.  Comprehensive metabolic panel is notable for normal electrolytes, mildly elevated AST and ALT, and an anion gap of 18 and a glucose of 57.  COVID-19 swab is pending although he does not qualify for rapid study at this time.    [CF]  0455 Opiate, Ur Screen(!): POSITIVE [CF]  0456 Cannabinoid 50 Ng, Ur [place](!): POSITIVE [CF]  0503 The patient is now awake and alert, calm, and asking for pain medicine.  I provided Toradol 15 mg IV.  He says that his throat hurts and his voice is currently hoarse most likely due to his violent  episodes of vomiting because he did not have a sore throat earlier today.  His fiance is now at bedside.  She confirms that he has not been feeling well for a couple of days and has had several episodes of vomiting.  He has not been having any abdominal pain.  He has been using his insulin but is out of test strips at  home so he has not been able to check his blood sugar and he has just been guessing.  Of note, I  asked him about using heroin and he denies it.  I explained that his opiate test was positive in the urine and even after telling him about the positive results he continues to deny taking any recent pain medicines including heroin.  I explained that we would continue to monitor him but he says that he feels much better now.  His fingerstick blood sugar obtained while I was in the room was 141.  We will repeat the lactic acid after the liter bolus of normal saline finishes.  Of additional note, the fianc reported that he was having seizure-like activity at home which is likely the cause of his elevated lactic acid.  Additionally, his fiance reports that they have no heat in their house which is likely why he was so cold particularly after an extended episode of unresponsiveness.    [CF]  [phone] Complete resolution of lactic acidemia.  Very reassuring.  Patient feeling better and resting comfortably.  Will check another FSBS, and if appropriate and patient can tolerate some PO intake, he can be discharged.  Lactic Acid, Venous: 0.8 [CF]  [id number] Patient has been tolerating PO intake.  Will d/c for outpatient follow up.   [CF]    Clinical Course User Index [CF] Loleta RoseForbach, Hendrix Yurkovich, MD     ____________________________________________  FINAL CLINICAL IMPRESSION(S) / ED DIAGNOSES  Final diagnoses:  Hypoglycemia due to type 1 diabetes mellitus (HCC)  Opioid use     MEDICATIONS GIVEN DURING THIS VISIT:  Medications  ondansetron (ZOFRAN) injection 4 mg (4 mg Intravenous Given 06/08/19 0324)  sodium chloride 0.9 % bolus 1,000 mL (0 mLs Intravenous Stopped 06/08/19 0549)  ketorolac (TORADOL) 30 MG/ML injection 15 mg (15 mg Intravenous Given 06/08/19 0459)     ED Discharge Orders    None      *Please note:  Trevor Brown was evaluated in Emergency Department on 06/08/2019 for the symptoms described in the history of present illness. He was evaluated in the context of the global COVID-19  pandemic, which necessitated consideration that the patient might be at risk for infection with the SARS-CoV-2 virus that causes COVID-19. Institutional protocols and algorithms that pertain to the evaluation of patients at risk for COVID-19 are in a state of rapid change based on information released by regulatory bodies including the CDC and federal and state organizations. These policies and algorithms were followed during the patient's care in the ED.  Some ED evaluations and interventions may be delayed as a result of limited staffing during the pandemic.*  Note:  This document was prepared using Dragon voice recognition software and may include unintentional dictation errors.   Loleta RoseForbach, Enio Hornback, MD 06/08/19 [phone]

## 2019-06-08 NOTE — ED Notes (Signed)
Repeat lactic acid sent to lab.

## 2019-06-08 NOTE — ED Notes (Signed)
Urine sent to the lab. Pt c/o sore throat. Fiance at the bedside.

## 2019-06-08 NOTE — Discharge Instructions (Signed)
Please avoid drugs and alcohol and take your medication as prescribed.  Follow-up with your regular doctor.

## 2019-06-08 NOTE — ED Notes (Signed)
Bair hugger placed on pt and covered with warm blankets. Pt is sleeping soundly at this time with fluids infusing.

## 2019-06-08 NOTE — ED Notes (Signed)
CBG 36. Patient sitting up, alert. Able to speak full, complete sentences. Provider made aware. Patient given orange juice, peanut butter and crackers. Will recheck.

## 2019-06-28 ENCOUNTER — Other Ambulatory Visit: Payer: Self-pay | Admitting: Gerontology

## 2019-06-28 ENCOUNTER — Other Ambulatory Visit: Payer: Self-pay

## 2019-06-28 ENCOUNTER — Ambulatory Visit: Payer: Medicaid Other | Admitting: Gerontology

## 2019-06-28 DIAGNOSIS — G629 Polyneuropathy, unspecified: Secondary | ICD-10-CM

## 2019-06-28 DIAGNOSIS — E1065 Type 1 diabetes mellitus with hyperglycemia: Secondary | ICD-10-CM

## 2019-06-28 DIAGNOSIS — G588 Other specified mononeuropathies: Secondary | ICD-10-CM

## 2019-06-28 MED ORDER — FREESTYLE LITE TEST VI STRP: ORAL_STRIP | 5 refills | Status: DC

## 2019-06-28 MED ORDER — GABAPENTIN 100 MG PO CAPS: 200.0000 mg | ORAL_CAPSULE | Freq: Every day | ORAL | 3 refills | Status: DC

## 2019-06-28 NOTE — Patient Instructions (Signed)
Carbohydrate Counting for Diabetes Mellitus, Adult  Carbohydrate counting is a method of keeping track of how many carbohydrates you eat. Eating carbohydrates naturally increases the amount of sugar (glucose) in the blood. Counting how many carbohydrates you eat helps keep your blood glucose within normal limits, which helps you manage your diabetes (diabetes mellitus). It is important to know how many carbohydrates you can safely have in each meal. This is different for every person. A diet and nutrition specialist (registered dietitian) can help you make a meal plan and calculate how many carbohydrates you should have at each meal and snack. Carbohydrates are found in the following foods:  Grains, such as breads and cereals.  Dried beans and soy products.  Starchy vegetables, such as potatoes, peas, and corn.  Fruit and fruit juices.  Milk and yogurt.  Sweets and snack foods, such as cake, cookies, candy, chips, and soft drinks. How do I count carbohydrates? There are two ways to count carbohydrates in food. You can use either of the methods or a combination of both. Reading "Nutrition Facts" on packaged food The "Nutrition Facts" list is included on the labels of almost all packaged foods and beverages in the [place] It includes:  The serving size.  Information about nutrients in each serving, including the grams (g) of carbohydrate per serving. To use the "Nutrition Facts":  Decide how many servings you will have.  Multiply the number of servings by the number of carbohydrates per serving.  The resulting number is the total amount of carbohydrates that you will be having. Learning standard serving sizes of other foods When you eat carbohydrate foods that are not packaged or do not include "Nutrition Facts" on the label, you need to measure the servings in order to count the amount of carbohydrates:  Measure the foods that you will eat with a food scale or measuring cup, if needed.   Decide how many standard-size servings you will eat.  Multiply the number of servings by 15. Most carbohydrate-rich foods have about 15 g of carbohydrates per serving. ? For example, if you eat 8 oz (170 g) of strawberries, you will have eaten 2 servings and 30 g of carbohydrates (2 servings x 15 g = 30 g).  For foods that have more than one food mixed, such as soups and casseroles, you must count the carbohydrates in each food that is included. The following list contains standard serving sizes of common carbohydrate-rich foods. Each of these servings has about 15 g of carbohydrates:   hamburger bun or  English muffin.   oz (15 mL) syrup.   oz (14 g) jelly.  1 slice of bread.  1 six-inch tortilla.  3 oz (85 g) cooked rice or pasta.  4 oz (113 g) cooked dried beans.  4 oz (113 g) starchy vegetable, such as peas, corn, or potatoes.  4 oz (113 g) hot cereal.  4 oz (113 g) mashed potatoes or  of a large baked potato.  4 oz (113 g) canned or frozen fruit.  4 oz (120 mL) fruit juice.  4-6 crackers.  6 chicken nuggets.  6 oz (170 g) unsweetened dry cereal.  6 oz (170 g) plain fat-free yogurt or yogurt sweetened with artificial sweeteners.  8 oz (240 mL) milk.  8 oz (170 g) fresh fruit or one small piece of fruit.  24 oz (680 g) popped popcorn. Example of carbohydrate counting Sample meal  3 oz (85 g) chicken breast.  6 oz (170 g)   brown rice.  4 oz (113 g) corn.  8 oz (240 mL) milk.  8 oz (170 g) strawberries with sugar-free whipped topping. Carbohydrate calculation 1. Identify the foods that contain carbohydrates: ? Rice. ? Corn. ? Milk. ? Strawberries. 2. Calculate how many servings you have of each food: ? 2 servings rice. ? 1 serving corn. ? 1 serving milk. ? 1 serving strawberries. 3. Multiply each number of servings by 15 g: ? 2 servings rice x 15 g = 30 g. ? 1 serving corn x 15 g = 15 g. ? 1 serving milk x 15 g = 15 g. ? 1 serving  strawberries x 15 g = 15 g. 4. Add together all of the amounts to find the total grams of carbohydrates eaten: ? 30 g + 15 g + 15 g + 15 g = 75 g of carbohydrates total. Summary  Carbohydrate counting is a method of keeping track of how many carbohydrates you eat.  Eating carbohydrates naturally increases the amount of sugar (glucose) in the blood.  Counting how many carbohydrates you eat helps keep your blood glucose within normal limits, which helps you manage your diabetes.  A diet and nutrition specialist (registered dietitian) can help you make a meal plan and calculate how many carbohydrates you should have at each meal and snack. This information is not intended to replace advice given to you by your health care provider. Make sure you discuss any questions you have with your health care provider. Document Released: 07/08/2005 Document Revised: 01/30/2017 Document Reviewed: 12/20/2015 Elsevier Patient Education  2020 Elsevier Inc.  

## 2019-06-28 NOTE — Progress Notes (Signed)
Established Patient Office Visit  Subjective:  Patient ID: Trevor Brown, male    DOB: 1990-10-24  Age: 28 y.o. MRN: [id number]  CC:  Chief Complaint  Patient presents with  . Diabetes    HPI Trevor Brown presents for follow-up of type 1 diabetes and peripheral neuropathy.  He states that he checks his blood glucose at least 3 times daily and he is compliant with his medication.  He states that his fasting reading today was 408 mg per DL, but most of the time it runs between 140 and 180 mg per DL.  His last hemoglobin A1c done on 03/03/2019 was 10.4%, and he missed all his follow-up appointments.  He states that taking 200 mg gabapentin at bedtime relieves his neuropathy.  He states that he performs daily foot checks, and continues to work on [employer]. he denies any chest pain, palpitation, lightheadedness, fever and chills.  He states that he is doing well and offers no further complaint.  Past Medical History:  Diagnosis Date  . Heart attack ([place])    Pt claims he had heart attack a year ago ( 2015)- and was admitted in [facility] for that, but not given any meds or angiogram, on review of chart- I could not find any details like that.  . Hepatitis C, acute may 2016  . Heroin abuse ([place])   . History of noncompliance with medical treatment   . Hypertension   . Type 1 diabetes [facility])     Past Surgical History:  Procedure Laterality Date  . none      Family History  Problem Relation Age of Onset  . Cirrhosis Mother   . Diabetes Mellitus II Maternal Grandmother   . Diabetes Mellitus II Maternal Grandfather     Social History   Socioeconomic History  . Marital status: Single    Spouse name: Not on file  . Number of children: Not on file  . Years of education: Not on file  . Highest education level: Not on file  Occupational History  . Occupation: tree cutter  Social Needs  . Financial resource strain: Hard  . Food insecurity    Worry: Never true    Inability: Never true  .  Transportation needs    Medical: No    Non-medical: No  Tobacco Use  . Smoking status: Current Every Day Smoker    Packs/day: 2.00    Types: Cigarettes  . Smokeless tobacco: Never Used  Substance and Sexual Activity  . Alcohol use: No    Alcohol/week: 0.0 standard drinks  . 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 Sexual activity: Yes  Lifestyle  . Physical activity    Days per week: 7 days    Minutes per session: 60 min  . Stress: Not on file  Relationships  . Social connections    Talks on phone: More than three times a week    Gets together: More than three times a week    Attends religious service: Never    Active member of club or organization: No    Attends meetings of clubs or organizations: Never    Relationship status: Living with partner  . Intimate partner violence    Fear of current or ex partner: No    Emotionally abused: No    Physically abused: No    Forced sexual activity: No  Other Topics Concern  .  Not on file  Social History Narrative  . Not on file    Outpatient Medications Prior to Visit  Medication Sig Dispense Refill  . insulin aspart (NOVOLOG) 100 UNIT/ML injection Inject 5-10 Units into the skin See admin instructions. INJECT BEFORE MEALS PER SLIDING SCALE AS DIRECTED: 120-250 = 5 UNITS;251-350 = 8 UNITS; OVER 350 = 10 UNITS 50 mL 2  . insulin aspart protamine- aspart (NOVOLOG MIX 70/30) (70-30) 100 UNIT/ML injection Inject 0.3 mLs (30 Units total) into the skin daily with breakfast. 50 mL 2  . insulin aspart protamine- aspart (NOVOLOG MIX 70/30) (70-30) 100 UNIT/ML injection Inject 0.32 mLs (32 Units total) into the skin daily with supper. 10 mL 2  . ondansetron (ZOFRAN ODT) 4 MG disintegrating tablet Take 1 tablet (4 mg total) by mouth every 8 (eight) hours as needed for nausea or vomiting. 20 tablet 0  . gabapentin (NEURONTIN) 100 MG capsule Take 2 capsules (200 mg total) by mouth at bedtime.  60 capsule 0  . glucose blood (FREESTYLE LITE) test strip To be used as directed for checking her blood sugar 60 each 5  . naproxen (NAPROSYN) 500 MG tablet Take 1 tablet (500 mg total) by mouth 2 (two) times daily with a meal. (Patient not taking: Reported on 06/28/2019) 20 tablet 2  . sulfamethoxazole-trimethoprim (BACTRIM DS) 800-160 MG tablet Take 1 tablet by mouth 2 (two) times daily. (Patient not taking: Reported on 04/11/2019) 20 tablet 0   No facility-administered medications prior to visit.     Allergies  Allergen Reactions  . Bee Venom Anaphylaxis  . Penicillins Anaphylaxis, Hives and Other (See Comments)    Has patient had a PCN reaction causing immediate rash, facial/tongue/throat swelling, SOB or lightheadedness with hypotension: Yes Has patient had a PCN reaction causing severe rash involving mucus membranes or skin necrosis: No Has patient had a PCN reaction that required hospitalization No Has patient had a PCN reaction occurring within the last 10 years: Yes If all of the above answers are "NO", then may proceed with Cephalosporin use.  . Ibuprofen Itching, Rash and Other (See Comments)    Other reaction(s): Other (See Comments) Stomach upset Reaction:  GI upset   . Tramadol Hives and Swelling  . Vancomycin Rash and Other (See Comments)    Reaction:  Red man's syndrome     ROS Review of Systems  Constitutional: Negative.   Eyes: Negative.   Respiratory: Negative.   Cardiovascular: Negative.   Endocrine: Negative.   Genitourinary: Negative.   Neurological: Negative.   Psychiatric/Behavioral: Negative.       Objective:    Physical Exam No PE was done There were no vitals taken for this visit. Wt Readings from Last 3 Encounters:  06/08/19 135 lb (61.2 kg)  04/11/19 140 lb (63.5 kg)  04/01/19 139 lb (63 kg)     Health Maintenance Due  Topic Date Due  . PNEUMOCOCCAL POLYSACCHARIDE VACCINE AGE 48-64 HIGH RISK  01/05/1995  . OPHTHALMOLOGY EXAM  01/05/2003   . TETANUS/TDAP  01/05/2012  . INFLUENZA VACCINE  02/20/2019    There are no preventive care reminders to display for this patient.  Lab Results  Component Value Date   TSH 1.170 03/03/2019   Lab Results  Component Value Date   WBC 15.1 (H) 06/08/2019   HGB 14.1 06/08/2019   HCT 44.0 06/08/2019   MCV 88.2 06/08/2019   PLT 295 06/08/2019   Lab Results  Component Value Date   NA 140 06/08/2019  K 3.6 06/08/2019   CO2 20 (L) 06/08/2019   GLUCOSE 57 (L) 06/08/2019   BUN 24 (H) 06/08/2019   CREATININE 1.15 06/08/2019   BILITOT 0.7 06/08/2019   ALKPHOS 111 06/08/2019   AST 69 (H) 06/08/2019   ALT 58 (H) 06/08/2019   PROT 8.8 (H) 06/08/2019   ALBUMIN 5.1 (H) 06/08/2019   CALCIUM 9.5 06/08/2019   ANIONGAP 18 (H) 06/08/2019   Lab Results  Component Value Date   CHOL 90 (L) 03/03/2019   Lab Results  Component Value Date   HDL 29 (L) 03/03/2019   Lab Results  Component Value Date   LDLCALC 49 03/03/2019   Lab Results  Component Value Date   TRIG 60 03/03/2019   Lab Results  Component Value Date   CHOLHDL 3.1 03/03/2019   Lab Results  Component Value Date   HGBA1C 10.4 (H) 03/03/2019      Assessment & Plan:   1. Type 1 diabetes mellitus with hyperglycemia (HCC) - His blood glucose is uncontrolled, his last HgbA1c was 10.4% and his goal is < 6%. -Use Diabetic diet as advised  -Check blood sugar 3 times a day, write down the numbers against date in a log and bring log to clinic every visit -Take medications regularly as advised -Regular exercise - glucose blood (FREESTYLE LITE) test strip; To be used as directed for checking her blood sugar  Dispense: 60 each; Refill: 5 - HgB A1c; Future - Comp Met (CMET); Future - Urinalysis; Future - Ambulatory referral to Ophthalmology - He was encouraged to keep his appointment with Ambulatory referral to Endocrinology - gabapentin (NEURONTIN) 100 MG capsule; Take 2 capsules (200 mg total) by mouth at bedtime.   Dispense: 60 capsule; Refill: 3   2. Peripheral polyneuropathy - Neuropathy is controlled and he will continue on current treatment regimen. - gabapentin (NEURONTIN) 100 MG capsule; Take 2 capsules (200 mg total) by mouth at bedtime.  Dispense: 60 capsule; Refill: 3     Follow-up: Return in about 22 days (around 07/20/2019), or if symptoms worsen or fail to improve.    Giovonni Poirier Jerold Coombe, NP

## 2019-07-07 ENCOUNTER — Other Ambulatory Visit: Payer: Medicaid Other

## 2019-07-08 ENCOUNTER — Other Ambulatory Visit: Payer: Self-pay

## 2019-07-08 ENCOUNTER — Other Ambulatory Visit: Payer: Medicaid Other

## 2019-07-08 DIAGNOSIS — Z0000 Encounter for general adult medical examination without abnormal findings: Secondary | ICD-10-CM

## 2019-07-08 DIAGNOSIS — E1065 Type 1 diabetes mellitus with hyperglycemia: Secondary | ICD-10-CM

## 2019-07-08 DIAGNOSIS — G629 Polyneuropathy, unspecified: Secondary | ICD-10-CM

## 2019-07-08 NOTE — Progress Notes (Signed)
hi

## 2019-07-09 LAB — URINALYSIS
Bilirubin, UA: NEGATIVE
Nitrite, UA: NEGATIVE
RBC, UA: NEGATIVE
Specific Gravity, UA: >=1.03 — AB (ref 1.005–1.030)
Urobilinogen, Ur: 1 mg/dL (ref 0.2–1.0)
pH, UA: 5.5 (ref 5.0–7.5)

## 2019-07-09 LAB — COMPREHENSIVE METABOLIC PANEL
ALT: 54 IU/L — ABNORMAL HIGH (ref 0–44)
AST: 65 IU/L — ABNORMAL HIGH (ref 0–40)
Albumin/Globulin Ratio: 1.5 (ref 1.2–2.2)
Albumin: 4.7 g/dL (ref 4.1–5.2)
Alkaline Phosphatase: 161 IU/L — ABNORMAL HIGH (ref 39–117)
BUN/Creatinine Ratio: 16 (ref 9–20)
BUN: 29 mg/dL — ABNORMAL HIGH (ref 6–20)
Bilirubin Total: 0.5 mg/dL (ref 0.0–1.2)
CO2: 17 mmol/L — ABNORMAL LOW (ref 20–29)
Calcium: 9.5 mg/dL (ref 8.7–10.2)
Chloride: 92 mmol/L — ABNORMAL LOW (ref 96–106)
Creatinine, Ser: 1.78 mg/dL — ABNORMAL HIGH (ref 0.76–1.27)
GFR calc Af Amer: 60 mL/min/{1.73_m2} (ref >59)
GFR calc non Af Amer: 52 mL/min/{1.73_m2} — ABNORMAL LOW (ref >59)
Globulin, Total: 3.1 g/dL (ref 1.5–4.5)
Glucose: 403 mg/dL — ABNORMAL HIGH (ref 65–99)
Potassium: 4.8 mmol/L (ref 3.5–5.2)
Sodium: 131 mmol/L — ABNORMAL LOW (ref 134–144)
Total Protein: 7.8 g/dL (ref 6.0–8.5)

## 2019-07-09 LAB — HEMOGLOBIN A1C
Est. average glucose Bld gHb Est-mCnc: 237 mg/dL
Hgb A1c MFr Bld: 9.9 % — ABNORMAL HIGH (ref 4.8–5.6)

## 2019-07-20 ENCOUNTER — Ambulatory Visit: Payer: Medicaid Other | Admitting: Gerontology

## 2019-07-20 ENCOUNTER — Other Ambulatory Visit: Payer: Self-pay

## 2019-07-20 ENCOUNTER — Encounter: Payer: Self-pay | Admitting: Gerontology

## 2019-07-20 VITALS — BP 113/71 | HR 98 | Ht 64.0 in | Wt 126.0 lb

## 2019-07-20 DIAGNOSIS — F17200 Nicotine dependence, unspecified, uncomplicated: Secondary | ICD-10-CM | POA: Insufficient documentation

## 2019-07-20 DIAGNOSIS — R899 Unspecified abnormal finding in specimens from other organs, systems and tissues: Secondary | ICD-10-CM

## 2019-07-20 DIAGNOSIS — Z8619 Personal history of other infectious and parasitic diseases: Secondary | ICD-10-CM

## 2019-07-20 DIAGNOSIS — E1065 Type 1 diabetes mellitus with hyperglycemia: Secondary | ICD-10-CM

## 2019-07-20 LAB — PANEL 083904
Final Interpretation: NEGATIVE
HIV 1 AB: NEGATIVE
HIV 2 AB: NEGATIVE

## 2019-07-20 LAB — SPECIMEN STATUS REPORT

## 2019-07-20 NOTE — Patient Instructions (Signed)
Carbohydrate Counting for Diabetes Mellitus, Adult  Carbohydrate counting is a method of keeping track of how many carbohydrates you eat. Eating carbohydrates naturally increases the amount of sugar (glucose) in the blood. Counting how many carbohydrates you eat helps keep your blood glucose within normal limits, which helps you manage your diabetes (diabetes mellitus). It is important to know how many carbohydrates you can safely have in each meal. This is different for every person. A diet and nutrition specialist (registered dietitian) can help you make a meal plan and calculate how many carbohydrates you should have at each meal and snack. Carbohydrates are found in the following foods:  Grains, such as breads and cereals.  Dried beans and soy products.  Starchy vegetables, such as potatoes, peas, and corn.  Fruit and fruit juices.  Milk and yogurt.  Sweets and snack foods, such as cake, cookies, candy, chips, and soft drinks. How do I count carbohydrates? There are two ways to count carbohydrates in food. You can use either of the methods or a combination of both. Reading "Nutrition Facts" on packaged food The "Nutrition Facts" list is included on the labels of almost all packaged foods and beverages in the [place] It includes:  The serving size.  Information about nutrients in each serving, including the grams (g) of carbohydrate per serving. To use the "Nutrition Facts":  Decide how many servings you will have.  Multiply the number of servings by the number of carbohydrates per serving.  The resulting number is the total amount of carbohydrates that you will be having. Learning standard serving sizes of other foods When you eat carbohydrate foods that are not packaged or do not include "Nutrition Facts" on the label, you need to measure the servings in order to count the amount of carbohydrates:  Measure the foods that you will eat with a food scale or measuring cup, if needed.   Decide how many standard-size servings you will eat.  Multiply the number of servings by 15. Most carbohydrate-rich foods have about 15 g of carbohydrates per serving. ? For example, if you eat 8 oz (170 g) of strawberries, you will have eaten 2 servings and 30 g of carbohydrates (2 servings x 15 g = 30 g).  For foods that have more than one food mixed, such as soups and casseroles, you must count the carbohydrates in each food that is included. The following list contains standard serving sizes of common carbohydrate-rich foods. Each of these servings has about 15 g of carbohydrates:   hamburger bun or  English muffin.   oz (15 mL) syrup.   oz (14 g) jelly.  1 slice of bread.  1 six-inch tortilla.  3 oz (85 g) cooked rice or pasta.  4 oz (113 g) cooked dried beans.  4 oz (113 g) starchy vegetable, such as peas, corn, or potatoes.  4 oz (113 g) hot cereal.  4 oz (113 g) mashed potatoes or  of a large baked potato.  4 oz (113 g) canned or frozen fruit.  4 oz (120 mL) fruit juice.  4-6 crackers.  6 chicken nuggets.  6 oz (170 g) unsweetened dry cereal.  6 oz (170 g) plain fat-free yogurt or yogurt sweetened with artificial sweeteners.  8 oz (240 mL) milk.  8 oz (170 g) fresh fruit or one small piece of fruit.  24 oz (680 g) popped popcorn. Example of carbohydrate counting Sample meal  3 oz (85 g) chicken breast.  6 oz (170 g)   brown rice.  4 oz (113 g) corn.  8 oz (240 mL) milk.  8 oz (170 g) strawberries with sugar-free whipped topping. Carbohydrate calculation 1. Identify the foods that contain carbohydrates: ? Rice. ? Corn. ? Milk. ? Strawberries. 2. Calculate how many servings you have of each food: ? 2 servings rice. ? 1 serving corn. ? 1 serving milk. ? 1 serving strawberries. 3. Multiply each number of servings by 15 g: ? 2 servings rice x 15 g = 30 g. ? 1 serving corn x 15 g = 15 g. ? 1 serving milk x 15 g = 15 g. ? 1 serving  strawberries x 15 g = 15 g. 4. Add together all of the amounts to find the total grams of carbohydrates eaten: ? 30 g + 15 g + 15 g + 15 g = 75 g of carbohydrates total. Summary  Carbohydrate counting is a method of keeping track of how many carbohydrates you eat.  Eating carbohydrates naturally increases the amount of sugar (glucose) in the blood.  Counting how many carbohydrates you eat helps keep your blood glucose within normal limits, which helps you manage your diabetes.  A diet and nutrition specialist (registered dietitian) can help you make a meal plan and calculate how many carbohydrates you should have at each meal and snack. This information is not intended to replace advice given to you by your health care provider. Make sure you discuss any questions you have with your health care provider. Document Released: 07/08/2005 Document Revised: 01/30/2017 Document Reviewed: 12/20/2015 Elsevier Patient Education  2020 Elsevier Inc.  

## 2019-07-20 NOTE — Progress Notes (Signed)
Established Patient Office Visit  Subjective:  Patient ID: Trevor Brown, male    DOB: 1990-09-06  Age: 28 y.o. MRN: [id number]  CC:  Chief Complaint  Patient presents with  . Diabetes    HPI Trevor Brown presents for follow-up of type 1 diabetes, peripheral neuropathy and lab review  . His HgbA1c done on 07/08/2019 decreased from 10.4% to 9.9%. He states that he checks his blood glucose at least 3 times daily and he is compliant with his medication.He forgot to bring his blood glucose log to visit, but states that his fasting blood glucose today was 130 mg/dl. He admits to having an episode of hypoglycemia two days ago and he drank a glass of orange juice. He states that his peripheral neuropathy is under control by taking 200 mg gabapentin at bedtime and he performs daily foot checks. He continues to smoke 2 packs of cigarette daily and denies the desire to quit. Labs done on 07/08/2019,  Serum creatinine was 1.78, GFR cal for non Af American was 52, Sodium was 131 mmol/L, AST 65 and ALT 54. His Hep C done on 03/03/2019 was >11.0 and he didn't show up for his Gastroentology appointment with Dr Servando Snare on 03/31/2019. He denies chest pain, light headedness,right upper quadrant pain, jaundice, fever and chills. He states that he's doing well and offers no further complaint.  Past Medical History:  Diagnosis Date  . Heart attack (HCC)    Pt claims he had heart attack a year ago ( 2015)- and was admitted in [facility] for that, but not given any meds or angiogram, on review of chart- I could not find any details like that.  . Hepatitis C, acute may 2016  . Heroin abuse (HCC)   . History of noncompliance with medical treatment   . Hypertension   . Type 1 diabetes [facility])     Past Surgical History:  Procedure Laterality Date  . none      Family History  Problem Relation Age of Onset  . Cirrhosis Mother   . Diabetes Mellitus II Maternal Grandmother   . Diabetes Mellitus II Maternal Grandfather      Social History   Socioeconomic History  . Marital status: Single    Spouse name: Not on file  . Number of children: Not on file  . Years of education: Not on file  . Highest education level: Not on file  Occupational History  . Occupation: tree cutter  Tobacco Use  . Smoking status: Current Every Day Smoker    Packs/day: 2.00    Types: Cigarettes  . Smokeless tobacco: Never Used  Substance and Sexual Activity  . Alcohol use: No    Alcohol/week: 0.0 standard drinks  . 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 Sexual activity: Yes  Other Topics Concern  . Not on file  Social History Narrative  . Not on file   Social Determinants of Health   Financial Resource Strain: High Risk  . Difficulty of Paying Living Expenses: Hard  Food Insecurity: No Food Insecurity  . Worried About Programme researcher, broadcasting/film/video in the Last Year: Never true  . Ran Out of Food in the Last Year: Never true  Transportation Needs: No Transportation Needs  . Lack of Transportation (Medical): No  . Lack of Transportation (Non-Medical): No  Physical Activity: Sufficiently Active  . Days of Exercise per Week: 7 days  .  Minutes of Exercise per Session: 60 min  Stress:   . Feeling of Stress : Not asked  Social Connections: Somewhat Isolated  . Frequency of Communication with Friends and Family: More than three times a week  . Frequency of Social Gatherings with Friends and Family: More than three times a week  . Attends Religious Services: Never  . Active Member of Clubs or Organizations: No  . Attends Archivist Meetings: Never  . Marital Status: Living with partner  Intimate Partner Violence: Not At Risk  . Fear of Current or Ex-Partner: No  . Emotionally Abused: No  . Physically Abused: No  . Sexually Abused: No    Outpatient Medications Prior to Visit  Medication Sig Dispense Refill  . gabapentin (NEURONTIN) 100 MG capsule Take 2  capsules (200 mg total) by mouth at bedtime. 60 capsule 3  . glucose blood (FREESTYLE LITE) test strip To be used as directed for checking her blood sugar 60 each 5  . insulin aspart (NOVOLOG) 100 UNIT/ML injection Inject 5-10 Units into the skin See admin instructions. INJECT BEFORE MEALS PER SLIDING SCALE AS DIRECTED: 120-250 = 5 UNITS;251-350 = 8 UNITS; OVER 350 = 10 UNITS 50 mL 2  . insulin aspart protamine- aspart (NOVOLOG MIX 70/30) (70-30) 100 UNIT/ML injection Inject 0.3 mLs (30 Units total) into the skin daily with breakfast. 50 mL 2  . insulin aspart protamine- aspart (NOVOLOG MIX 70/30) (70-30) 100 UNIT/ML injection Inject 0.32 mLs (32 Units total) into the skin daily with supper. 10 mL 2  . ondansetron (ZOFRAN ODT) 4 MG disintegrating tablet Take 1 tablet (4 mg total) by mouth every 8 (eight) hours as needed for nausea or vomiting. 20 tablet 0   No facility-administered medications prior to visit.    Allergies  Allergen Reactions  . Bee Venom Anaphylaxis  . Penicillins Anaphylaxis, Hives and Other (See Comments)    Has patient had a PCN reaction causing immediate rash, facial/tongue/throat swelling, SOB or lightheadedness with hypotension: Yes Has patient had a PCN reaction causing severe rash involving mucus membranes or skin necrosis: No Has patient had a PCN reaction that required hospitalization No Has patient had a PCN reaction occurring within the last 10 years: Yes If all of the above answers are "NO", then may proceed with Cephalosporin use.  . Ibuprofen Itching, Rash and Other (See Comments)    Other reaction(s): Other (See Comments) Stomach upset Reaction:  GI upset   . Tramadol Hives and Swelling  . Vancomycin Rash and Other (See Comments)    Reaction:  Red man's syndrome     ROS Review of Systems  Constitutional: Negative.   Respiratory: Negative.   Cardiovascular: Negative.   Endocrine: Negative.   Genitourinary: Negative.   Neurological: Negative.    Psychiatric/Behavioral: Negative.       Objective:    Physical Exam  Constitutional: He is oriented to person, place, and time. He appears well-developed.  HENT:  Head: Normocephalic.  Eyes: Pupils are equal, round, and reactive to light. EOM are normal.  Cardiovascular: Normal rate and regular rhythm.  Pulmonary/Chest: Effort normal and breath sounds normal.  Abdominal: Soft. There is no abdominal tenderness.  Neurological: He is alert and oriented to person, place, and time.  Skin: Skin is warm and dry.  Psychiatric: He has a normal mood and affect. His behavior is normal. Judgment and thought content normal.    BP 113/71 (BP Location: Left Arm, Patient Position: Sitting)   Pulse 98  Ht 5\' 4"  (1.626 m)   Wt 126 lb (57.2 kg)   SpO2 100%   BMI 21.63 kg/m  Wt Readings from Last 3 Encounters:  07/20/19 126 lb (57.2 kg)  06/08/19 135 lb (61.2 kg)  04/11/19 140 lb (63.5 kg)   He was encouraged to increase his caloric intake, he lost 9 pounds in 6 weeks.  Health Maintenance Due  Topic Date Due  . PNEUMOCOCCAL POLYSACCHARIDE VACCINE AGE 40-64 HIGH RISK  01/05/1995  . OPHTHALMOLOGY EXAM  01/05/2003  . TETANUS/TDAP  01/05/2012  . INFLUENZA VACCINE  02/20/2019    There are no preventive care reminders to display for this patient.  Lab Results  Component Value Date   TSH 1.170 03/03/2019   Lab Results  Component Value Date   WBC 15.1 (H) 06/08/2019   HGB 14.1 06/08/2019   HCT 44.0 06/08/2019   MCV 88.2 06/08/2019   PLT 295 06/08/2019   Lab Results  Component Value Date   NA 131 (L) 07/08/2019   K 4.8 07/08/2019   CO2 17 (L) 07/08/2019   GLUCOSE 403 (H) 07/08/2019   BUN 29 (H) 07/08/2019   CREATININE 1.78 (H) 07/08/2019   BILITOT 0.5 07/08/2019   ALKPHOS 161 (H) 07/08/2019   AST 65 (H) 07/08/2019   ALT 54 (H) 07/08/2019   PROT 7.8 07/08/2019   ALBUMIN 4.7 07/08/2019   CALCIUM 9.5 07/08/2019   ANIONGAP 18 (H) 06/08/2019   Lab Results  Component Value  Date   CHOL 90 (L) 03/03/2019   Lab Results  Component Value Date   HDL 29 (L) 03/03/2019   Lab Results  Component Value Date   LDLCALC 49 03/03/2019   Lab Results  Component Value Date   TRIG 60 03/03/2019   Lab Results  Component Value Date   CHOLHDL 3.1 03/03/2019   Lab Results  Component Value Date   HGBA1C 9.9 (H) 07/08/2019      Assessment & Plan:    1. Type 1 diabetes mellitus with hyperglycemia (HCC) - His HgbA1c was 9.9% and his goal is < 6%. He will continue on current treatment regimen, was advised to continue on low carb/non concentrated sweet diet. He was also advised to check, record blood glucose and bring log to follow up appointment. [facility] - Ambulatory referral to Endocrinology  2. History of hepatitis C - His liver enzymes were elevated, he was provided with Dr Annabell SabalWohl's office number on was strongly advised to call and schedule an appointment to follow-up with his hepatitis C.  3. Abnormal laboratory test result - His serum creatinine was 1.78 mg/dl and he was encouraged to increase fluid intake, and tighter glycemic control. His sodium was 131 mg/dl, he was advised to increase salt intake in his diet. -We will recheck serum creatinine and sodium in Basic Metabolic Panel (BMET); Future  4. Smoking -He was strongly encouraged on smoking cessation, and was provided with [place] quit line information.    Follow-up: Return in about 6 weeks (around 08/31/2019), or if symptoms worsen or fail to improve.    Gavriella Hearst Trellis PaganiniE Delani Kohli, NP

## 2019-08-10 ENCOUNTER — Ambulatory Visit: Payer: Medicaid Other | Admitting: Internal Medicine

## 2019-08-10 ENCOUNTER — Other Ambulatory Visit: Payer: Self-pay

## 2019-08-10 DIAGNOSIS — E1065 Type 1 diabetes mellitus with hyperglycemia: Secondary | ICD-10-CM

## 2019-08-10 MED ORDER — INSULIN ASPART 100 UNIT/ML ~~LOC~~ SOLN: 5.0000 [IU] | SUBCUTANEOUS | 2 refills | Status: DC

## 2019-08-10 MED ORDER — INSULIN GLARGINE 100 UNIT/ML ~~LOC~~ SOLN: 25.0000 [IU] | Freq: Every day | SUBCUTANEOUS | 11 refills | Status: DC

## 2019-08-10 NOTE — Patient Instructions (Addendum)
Once you get the new medication (Lantus), stop taking the Novolog (70/30). Take 25 units of Lantus at nighttime.  Check blood glucose at each meal. Make sure to take at least 5 units of Novolog with each meal, and use the sliding scale (add more units to 5 units Novolog) if blood glucose is high.

## 2019-08-10 NOTE — Progress Notes (Signed)
New Diabetes Consultation [facility]     Patient ID: Trevor Brown, male   DOB: 1991/04/20, 29 y.o.   MRN: [id number] Assessment/Plan:  Trevor Brown is a 29 y.o. male who is seen in consultation for No primary diagnosis found. at the request of Trevor Brown, Trevor E, NP.  1. Type 1 diabetes mellitus with hyperglycemia (HCC)    1. Type 1 diabetes Patient has history of type 1 diabetes for 10 years, with episodes in the past for hypoglycemia. Current regimen includes Novolog (70/30) 32u in morning, 33u at night with sliding scale of regular Novolog at meals. Has taken Lantus in the past, but stopped given difficulty accessing Lantus with no insurance. Aware of sx of low sugars, which has not happened recently. Morning bg readings in the 250s-300s, with sx of worsening neuropathy in hands, increased urinary frequency, and occasional blurry vision. HbA1c of 9.9 in 06/2019. Worsening sx of diabetes complications and high morning bg worrisome for poor glycemic control, which is supported by HbA1c of 9.9 higher than goal of <7%. - Patient would benefit from long-acting insulin to help with basal glucose levels - Lantus 25u at nighttime - Continue with regular Novolog sliding scale, with at least 5 units for every meal. Increase Novolog (from 5 units) using sliding scale. - HbA1c due 09/2019 - UACR due 02/2020 - Diabetic foot exam when next in-person     Patient Instructions  Once you get the new medication (Lantus), stop taking the Novolog (70/30). Take 25 units of Lantus at nighttime.  Check blood glucose at each meal. Make sure to take at least 5 units of Novolog with each meal, and use the sliding scale (add more units to 5 units Novolog) if blood glucose is high.  No orders of the defined types were placed in this encounter.  Subjective:  Trevor Brown is a 29 yo man with PMHx Type 1 diabetes (diagnosed since 29yo) and Hep C who presents to clinic for diabetes f/u today. Currently taking Novolog  (70/30): 32 units in the morning, 33 units at night and regular Novolog on a sliding scale, with meals if bg sugars are elevated at meal times. Takes Gabapentin for muscle cramp: 2x-3x a day every time the muscles lock up. States having CP off and on, will have CP when sugars are high. No SOB, rapid heart beats with CP, but will sweat. Will occasionally have blurry vision, upset stomach. Will pee a lot when sugars are high, and have been going to bathroom more frequently recently. Tingling sensations in the hands comes and goes, which has recently developed. Sometimes will have tingling sensations in feet sometimes. Gabapentin will help sometimes with tingling sensation. Denies difficulty walking, difficulty swallowing, or feelings of food stuck in throat. Bg readings will flucutate, with morning bg readings 250s-300s with readings also in the 120s-150s. Most recently been closer to the 300s in the mornings. Does not change insulin regimen when morning glucose are high, but will change what he eats (Brown.g., eat less) during the day if sugars are high. Has sx of shakes, chills with sweating when sugars are low which he corrects with orange juice. No recent episodes of low sugars. Diet: Will eat anything, does not count carbohydrates. Beverages include flavored water when sugars are high and orange juice when sugars are low. Smoke cigarettes (1.5ppd for 10 years). Does not drink alcohol. Smoke weed. 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  Trevor Brown family history includes Cirrhosis in his mother; Diabetes Mellitus II in his maternal grandfather and maternal grandmother.  Trevor Brown  reports that he has been smoking cigarettes. He has been smoking about 2.00 packs per day. He has never used smokeless tobacco.  He reports previous drug use. Drugs: IV and Heroin. He reports that he does not drink alcohol.   Current Outpatient Medications:  .  gabapentin (NEURONTIN) 100 MG capsule, Take 2 capsules (200 mg total) by mouth at bedtime., Disp: 60 capsule, Rfl: 3 .  glucose blood (FREESTYLE LITE) test strip, To be used as directed for checking her blood sugar, Disp: 60 each, Rfl: 5 .  insulin aspart (NOVOLOG) 100 UNIT/ML injection, Inject 5-10 Units into the skin See admin instructions. INJECT BEFORE MEALS PER SLIDING SCALE AS DIRECTED: 80-250 = 5 UNITS;251-350 = 8 UNITS; OVER 350 = 10 UNITS, Disp: 50 mL, Rfl: 2 .  insulin glargine (LANTUS) 100 UNIT/ML injection, Inject 0.25 mLs (25 Units total) into the skin daily., Disp: 10 mL, Rfl: 11 .  ondansetron (ZOFRAN ODT) 4 MG disintegrating tablet, Take 1 tablet (4 mg total) by mouth every 8 (eight) hours as needed for nausea or vomiting., Disp: 20 tablet, Rfl: 0  Allergies  Allergen Reactions  . Bee Venom Anaphylaxis  . Penicillins Anaphylaxis, Hives and Other (See Comments)    Has patient had a PCN reaction causing immediate rash, facial/tongue/throat swelling, SOB or lightheadedness with hypotension: Yes Has patient had a PCN reaction causing severe rash involving mucus membranes or skin necrosis: No Has patient had a PCN reaction that required hospitalization No Has patient had a PCN reaction occurring within the last 10 years: Yes If all of the above answers are "NO", then may proceed with Cephalosporin use.  . Ibuprofen Itching, Rash and Other (See Comments)    Other reaction(s): Other (See Comments) Stomach upset Reaction:  GI upset   . Tramadol Hives and Swelling  . Vancomycin Rash and Other (See Comments)    Reaction:  Red man's syndrome    Objective:    Physical Exam No physical exam given Telehealth visit.  Data/Results/Labs  : I have personally reviewed pertinent labs and imaging studies, if indicated,  with the patient in clinic today.  No  results found for this or any previous visit (from the past 672 hour(s)).]  HC Readings from Last 3 Encounters:  No data found for [facility]    Wt Readings from Last 3 Encounters:  07/20/19 126 lb (57.2 kg)  06/08/19 135 lb (61.2 kg)  04/11/19 140 lb (63.5 kg)

## 2019-08-12 ENCOUNTER — Telehealth: Payer: Self-pay | Admitting: Pharmacist

## 2019-08-12 ENCOUNTER — Other Ambulatory Visit: Payer: Self-pay

## 2019-08-12 MED ORDER — BASAGLAR KWIKPEN 100 UNIT/ML ~~LOC~~ SOPN: 25.0000 [IU] | PEN_INJECTOR | Freq: Every day | SUBCUTANEOUS | 0 refills | Status: DC

## 2019-08-12 NOTE — Telephone Encounter (Signed)
08/12/2019 9:12:32 AM - Lantus Vials to pat & dr  08/12/2019 I received a pharmacy printout for new med-Lantus Vials Inject 25 units into the skin daily #3. Printed [employer] application putting patient's in bag with meds--will also need current income/support and taxes/4506T, also sending provider portion to [facility].Forde Radon

## 2019-08-25 ENCOUNTER — Other Ambulatory Visit: Payer: Medicaid Other

## 2019-08-31 ENCOUNTER — Other Ambulatory Visit: Payer: Self-pay

## 2019-08-31 ENCOUNTER — Ambulatory Visit: Payer: Medicaid Other | Admitting: Gerontology

## 2019-08-31 DIAGNOSIS — E1065 Type 1 diabetes mellitus with hyperglycemia: Secondary | ICD-10-CM

## 2019-08-31 DIAGNOSIS — G588 Other specified mononeuropathies: Secondary | ICD-10-CM

## 2019-08-31 NOTE — Progress Notes (Signed)
Established Patient Office Visit  Subjective:  Patient ID: Trevor Brown, male    DOB: [birth year]  Age: 29 y.o. MRN: [id number]  CC:  Chief Complaint  Patient presents with  . Diabetes    BS was 50 this morning   He consents to telephone visit and 2 patient identifiers was used to identify patient. HPI Trevor Brown presents for follow up of type 1 diabetes. His HgbA1c done on 07/08/2019 was 9.9%. He states that he checks his blood glucose 4-5 times daily. He reports that his fasting blood glucose reading this morning was 50 mg/dl, experienced hypoglycemic symptoms and reports feeling better after drinking orange juice, but he has not rechecked blood glucose afterwards. He had a Telehealth visit with [facility] on 08/10/2019 and Dr Macy Mis recommended him switching to 25 units of Lantus at night, and 5 units of Novolog with meals and extra 5 units with hyperglycemia and to discontinue Insulin 70/30. He reports that he has not picked up his [place] from the Pharmacy and will try to pick it up today. He states that taking 200 mg gabapentin daily relieves peripheral neuropathic pain. He performs daily foot checks and admits to not adhering to ADA diet. Overall, he states that he's doing well and offers no further complaint.    Past Medical History:  Diagnosis Date  . Heart attack ([place])    Pt claims he had heart attack a year ago ( 2015)- and was admitted in [facility] for that, but not given any meds or angiogram, on review of chart- I could not find any details like that.  . Hepatitis C, acute may 2016  . Heroin abuse ([place])   . History of noncompliance with medical treatment   . Hypertension   . Type 1 diabetes [facility])     Past Surgical History:  Procedure Laterality Date  . none      Family History  Problem Relation Age of Onset  . Cirrhosis Mother   . Diabetes Mellitus II Maternal Grandmother   . Diabetes Mellitus II Maternal Grandfather     Social History   Socioeconomic  History  . Marital status: Single    Spouse name: Not on file  . Number of children: Not on file  . Years of education: Not on file  . Highest education level: Not on file  Occupational History  . Occupation: tree cutter  Tobacco Use  . Smoking status: Current Every Day Smoker    Packs/day: 2.00    Types: Cigarettes  . Smokeless tobacco: Never Used  Substance and Sexual Activity  . Alcohol use: No    Alcohol/week: 0.0 standard drinks  . 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 Sexual activity: Yes  Other Topics Concern  . Not on file  Social History Narrative  . Not on file   Social Determinants of Health   Financial Resource Strain: High Risk  . Difficulty of Paying Living Expenses: Hard  Food Insecurity: No Food Insecurity  . Worried About Charity fundraiser in the Last Year: Never true  . Ran Out of Food in the Last Year: Never true  Transportation Needs: No Transportation Needs  . Lack of Transportation (Medical): No  . Lack of Transportation (Non-Medical): No  Physical Activity: Sufficiently Active  . Days of Exercise per Week: 7 days  . Minutes of Exercise per Session: 60 min  Stress:   .  Feeling of Stress : Not asked  Social Connections: Somewhat Isolated  . Frequency of Communication with Friends and Family: More than three times a week  . Frequency of Social Gatherings with Friends and Family: More than three times a week  . Attends Religious Services: Never  . Active Member of Clubs or Organizations: No  . Attends Banker Meetings: Never  . Marital Status: Living with partner  Intimate Partner Violence: Not At Risk  . Fear of Current or Ex-Partner: No  . Emotionally Abused: No  . Physically Abused: No  . Sexually Abused: No    Outpatient Medications Prior to Visit  Medication Sig Dispense Refill  . gabapentin (NEURONTIN) 100 MG capsule Take 2 capsules (200 mg total) by mouth at bedtime.  60 capsule 3  . glucose blood (FREESTYLE LITE) test strip To be used as directed for checking her blood sugar 60 each 5  . insulin aspart (NOVOLOG) 100 UNIT/ML injection Inject 5-10 Units into the skin See admin instructions. INJECT BEFORE MEALS PER SLIDING SCALE AS DIRECTED: 80-250 = 5 UNITS;251-350 = 8 UNITS; OVER 350 = 10 UNITS 50 mL 2  . Insulin Glargine (BASAGLAR KWIKPEN) 100 UNIT/ML SOPN Inject 0.25 mLs (25 Units total) into the skin daily. 7.5 mL 0  . ondansetron (ZOFRAN ODT) 4 MG disintegrating tablet Take 1 tablet (4 mg total) by mouth every 8 (eight) hours as needed for nausea or vomiting. 20 tablet 0   No facility-administered medications prior to visit.    Allergies  Allergen Reactions  . Bee Venom Anaphylaxis  . Penicillins Anaphylaxis, Hives and Other (See Comments)    Has patient had a PCN reaction causing immediate rash, facial/tongue/throat swelling, SOB or lightheadedness with hypotension: Yes Has patient had a PCN reaction causing severe rash involving mucus membranes or skin necrosis: No Has patient had a PCN reaction that required hospitalization No Has patient had a PCN reaction occurring within the last 10 years: Yes If all of the above answers are "NO", then may proceed with Cephalosporin use.  . Ibuprofen Itching, Rash and Other (See Comments)    Other reaction(s): Other (See Comments) Stomach upset Reaction:  GI upset   . Tramadol Hives and Swelling  . Vancomycin Rash and Other (See Comments)    Reaction:  Red man's syndrome     ROS Review of Systems  Constitutional: Negative.   Eyes: Negative.   Respiratory: Negative.   Cardiovascular: Negative.   Endocrine: Negative.   Skin: Negative.   Neurological: Positive for numbness (Peripheral Neuropathy).  Psychiatric/Behavioral: Negative.       Objective:    Physical Exam No physical exam was done There were no vitals taken for this visit. Wt Readings from Last 3 Encounters:  07/20/19 126 lb (57.2 kg)   06/08/19 135 lb (61.2 kg)  04/11/19 140 lb (63.5 kg)     Health Maintenance Due  Topic Date Due  . OPHTHALMOLOGY EXAM  01/05/2003  . TETANUS/TDAP  01/05/2012    There are no preventive care reminders to display for this patient.  Lab Results  Component Value Date   TSH 1.170 03/03/2019   Lab Results  Component Value Date   WBC 15.1 (H) 06/08/2019   HGB 14.1 06/08/2019   HCT 44.0 06/08/2019   MCV 88.2 06/08/2019   PLT 295 06/08/2019   Lab Results  Component Value Date   NA 131 (L) 07/08/2019   K 4.8 07/08/2019   CO2 17 (L) 07/08/2019   GLUCOSE 403 (  H) 07/08/2019   BUN 29 (H) 07/08/2019   CREATININE 1.78 (H) 07/08/2019   BILITOT 0.5 07/08/2019   ALKPHOS 161 (H) 07/08/2019   AST 65 (H) 07/08/2019   ALT 54 (H) 07/08/2019   PROT 7.8 07/08/2019   ALBUMIN 4.7 07/08/2019   CALCIUM 9.5 07/08/2019   ANIONGAP 18 (H) 06/08/2019   Lab Results  Component Value Date   CHOL 90 (L) 03/03/2019   Lab Results  Component Value Date   HDL 29 (L) 03/03/2019   Lab Results  Component Value Date   LDLCALC 49 03/03/2019   Lab Results  Component Value Date   TRIG 60 03/03/2019   Lab Results  Component Value Date   CHOLHDL 3.1 03/03/2019   Lab Results  Component Value Date   HGBA1C 9.9 (H) 07/08/2019      Assessment & Plan:   1. Uncontrolled type 1 diabetes mellitus with hyperglycemia (HCC) - His HgbA1c was 9.9% and his goal should be < 6%. His diabetes is not controlled. He was strongly advise to pick up Basaglar [facility].  - He was advised to use Diabetic diet as advised  -Check blood sugar 4 times a day, record and bring log to clinic every visit -Take medications regularly as advised -Regular exercise - He was advised that his fasting blood glucose should be between 80-130 mg/dl    2. Other mononeuropathy - His peripheral neuropathy is under control, he will continue on current treatment regimen.     Follow-up: Return in about  1 month (around 09/28/2019), or if symptoms worsen or fail to improve.    Naureen Benton Trellis Paganini, NP

## 2019-08-31 NOTE — Patient Instructions (Signed)
Carbohydrate Counting for Diabetes Mellitus, Adult  Carbohydrate counting is a method of keeping track of how many carbohydrates you eat. Eating carbohydrates naturally increases the amount of sugar (glucose) in the blood. Counting how many carbohydrates you eat helps keep your blood glucose within normal limits, which helps you manage your diabetes (diabetes mellitus). It is important to know how many carbohydrates you can safely have in each meal. This is different for every person. A diet and nutrition specialist (registered dietitian) can help you make a meal plan and calculate how many carbohydrates you should have at each meal and snack. Carbohydrates are found in the following foods:  Grains, such as breads and cereals.  Dried beans and soy products.  Starchy vegetables, such as potatoes, peas, and corn.  Fruit and fruit juices.  Milk and yogurt.  Sweets and snack foods, such as cake, cookies, candy, chips, and soft drinks. How do I count carbohydrates? There are two ways to count carbohydrates in food. You can use either of the methods or a combination of both. Reading "Nutrition Facts" on packaged food The "Nutrition Facts" list is included on the labels of almost all packaged foods and beverages in the [place] It includes:  The serving size.  Information about nutrients in each serving, including the grams (g) of carbohydrate per serving. To use the "Nutrition Facts":  Decide how many servings you will have.  Multiply the number of servings by the number of carbohydrates per serving.  The resulting number is the total amount of carbohydrates that you will be having. Learning standard serving sizes of other foods When you eat carbohydrate foods that are not packaged or do not include "Nutrition Facts" on the label, you need to measure the servings in order to count the amount of carbohydrates:  Measure the foods that you will eat with a food scale or measuring cup, if  needed.  Decide how many standard-size servings you will eat.  Multiply the number of servings by 15. Most carbohydrate-rich foods have about 15 g of carbohydrates per serving. ? For example, if you eat 8 oz (170 g) of strawberries, you will have eaten 2 servings and 30 g of carbohydrates (2 servings x 15 g = 30 g).  For foods that have more than one food mixed, such as soups and casseroles, you must count the carbohydrates in each food that is included. The following list contains standard serving sizes of common carbohydrate-rich foods. Each of these servings has about 15 g of carbohydrates:   hamburger bun or  English muffin.   oz (15 mL) syrup.   oz (14 g) jelly.  1 slice of bread.  1 six-inch tortilla.  3 oz (85 g) cooked rice or pasta.  4 oz (113 g) cooked dried beans.  4 oz (113 g) starchy vegetable, such as peas, corn, or potatoes.  4 oz (113 g) hot cereal.  4 oz (113 g) mashed potatoes or  of a large baked potato.  4 oz (113 g) canned or frozen fruit.  4 oz (120 mL) fruit juice.  4-6 crackers.  6 chicken nuggets.  6 oz (170 g) unsweetened dry cereal.  6 oz (170 g) plain fat-free yogurt or yogurt sweetened with artificial sweeteners.  8 oz (240 mL) milk.  8 oz (170 g) fresh fruit or one small piece of fruit.  24 oz (680 g) popped popcorn. Example of carbohydrate counting Sample meal  3 oz (85 g) chicken breast.  6 oz (170 g)   brown rice.  4 oz (113 g) corn.  8 oz (240 mL) milk.  8 oz (170 g) strawberries with sugar-free whipped topping. Carbohydrate calculation 1. Identify the foods that contain carbohydrates: ? Rice. ? Corn. ? Milk. ? Strawberries. 2. Calculate how many servings you have of each food: ? 2 servings rice. ? 1 serving corn. ? 1 serving milk. ? 1 serving strawberries. 3. Multiply each number of servings by 15 g: ? 2 servings rice x 15 g = 30 g. ? 1 serving corn x 15 g = 15 g. ? 1 serving milk x 15 g = 15 g. ? 1  serving strawberries x 15 g = 15 g. 4. Add together all of the amounts to find the total grams of carbohydrates eaten: ? 30 g + 15 g + 15 g + 15 g = 75 g of carbohydrates total. Summary  Carbohydrate counting is a method of keeping track of how many carbohydrates you eat.  Eating carbohydrates naturally increases the amount of sugar (glucose) in the blood.  Counting how many carbohydrates you eat helps keep your blood glucose within normal limits, which helps you manage your diabetes.  A diet and nutrition specialist (registered dietitian) can help you make a meal plan and calculate how many carbohydrates you should have at each meal and snack. This information is not intended to replace advice given to you by your health care provider. Make sure you discuss any questions you have with your health care provider. Document Revised: 01/30/2017 Document Reviewed: 12/20/2015 Elsevier Patient Education  2020 Elsevier Inc.  

## 2019-09-01 ENCOUNTER — Other Ambulatory Visit: Payer: Self-pay

## 2019-09-01 MED ORDER — ONDANSETRON 4 MG PO TBDP: 4.0000 mg | ORAL_TABLET | Freq: Three times a day (TID) | ORAL | 0 refills | Status: DC | PRN

## 2019-09-12 ENCOUNTER — Emergency Department: Payer: Self-pay

## 2019-09-12 ENCOUNTER — Inpatient Hospital Stay
Admission: EM | Admit: 2019-09-12 | Discharge: 2019-09-14 | DRG: 872 | Disposition: A | Discharge status: Home / Self Care | Payer: Self-pay | Attending: Internal Medicine | Admitting: Internal Medicine

## 2019-09-12 ENCOUNTER — Encounter: Payer: Self-pay | Admitting: Emergency Medicine

## 2019-09-12 ENCOUNTER — Other Ambulatory Visit: Payer: Self-pay

## 2019-09-12 DIAGNOSIS — D72829 Elevated white blood cell count, unspecified: Secondary | ICD-10-CM

## 2019-09-12 DIAGNOSIS — F12188 Cannabis abuse with other cannabis-induced disorder: Secondary | ICD-10-CM

## 2019-09-12 DIAGNOSIS — E10649 Type 1 diabetes mellitus with hypoglycemia without coma: Secondary | ICD-10-CM | POA: Diagnosis not present

## 2019-09-12 DIAGNOSIS — E1010 Type 1 diabetes mellitus with ketoacidosis without coma: Secondary | ICD-10-CM

## 2019-09-12 DIAGNOSIS — R112 Nausea with vomiting, unspecified: Secondary | ICD-10-CM

## 2019-09-12 DIAGNOSIS — K922 Gastrointestinal hemorrhage, unspecified: Secondary | ICD-10-CM

## 2019-09-12 DIAGNOSIS — Z9119 Patient's noncompliance with other medical treatment and regimen: Secondary | ICD-10-CM

## 2019-09-12 DIAGNOSIS — B182 Chronic viral hepatitis C: Secondary | ICD-10-CM

## 2019-09-12 DIAGNOSIS — E876 Hypokalemia: Secondary | ICD-10-CM

## 2019-09-12 DIAGNOSIS — E1065 Type 1 diabetes mellitus with hyperglycemia: Secondary | ICD-10-CM | POA: Diagnosis present

## 2019-09-12 DIAGNOSIS — I10 Essential (primary) hypertension: Secondary | ICD-10-CM | POA: Diagnosis present

## 2019-09-12 DIAGNOSIS — K047 Periapical abscess without sinus: Secondary | ICD-10-CM | POA: Diagnosis present

## 2019-09-12 DIAGNOSIS — Z833 Family history of diabetes mellitus: Secondary | ICD-10-CM

## 2019-09-12 DIAGNOSIS — A419 Sepsis, unspecified organism: Principal | ICD-10-CM

## 2019-09-12 DIAGNOSIS — R7989 Other specified abnormal findings of blood chemistry: Secondary | ICD-10-CM

## 2019-09-12 DIAGNOSIS — Z20822 Contact with and (suspected) exposure to covid-19: Secondary | ICD-10-CM | POA: Diagnosis present

## 2019-09-12 DIAGNOSIS — K920 Hematemesis: Secondary | ICD-10-CM | POA: Insufficient documentation

## 2019-09-12 DIAGNOSIS — Z880 Allergy status to penicillin: Secondary | ICD-10-CM

## 2019-09-12 DIAGNOSIS — I252 Old myocardial infarction: Secondary | ICD-10-CM

## 2019-09-12 DIAGNOSIS — Z794 Long term (current) use of insulin: Secondary | ICD-10-CM

## 2019-09-12 DIAGNOSIS — R1110 Vomiting, unspecified: Secondary | ICD-10-CM | POA: Insufficient documentation

## 2019-09-12 DIAGNOSIS — F17210 Nicotine dependence, cigarettes, uncomplicated: Secondary | ICD-10-CM | POA: Diagnosis present

## 2019-09-12 LAB — URINALYSIS, COMPLETE (UACMP) WITH MICROSCOPIC
Bacteria, UA: NONE SEEN
Bilirubin Urine: NEGATIVE
Glucose, UA: >=500 mg/dL — AB
Hgb urine dipstick: NEGATIVE
Ketones, ur: 20 mg/dL — AB
Leukocytes,Ua: NEGATIVE
Nitrite: NEGATIVE
Protein, ur: NEGATIVE mg/dL
Specific Gravity, Urine: 1.022 (ref 1.005–1.030)
Squamous Epithelial / HPF: NONE SEEN (ref 0–5)
pH: 5 (ref 5.0–8.0)

## 2019-09-12 LAB — CBC WITH DIFFERENTIAL/PLATELET
Abs Immature Granulocytes: 0.09 10*3/uL — ABNORMAL HIGH (ref 0.00–0.07)
Basophils Absolute: 0.1 10*3/uL (ref 0.0–0.1)
Basophils Relative: 1 %
Eosinophils Absolute: 0 10*3/uL (ref 0.0–0.5)
Eosinophils Relative: 0 %
HCT: 42 % (ref 39.0–52.0)
Hemoglobin: 14.4 g/dL (ref 13.0–17.0)
Immature Granulocytes: 1 %
Lymphocytes Relative: 5 %
Lymphs Abs: 1 10*3/uL (ref 0.7–4.0)
MCH: 29.8 pg (ref 26.0–34.0)
MCHC: 34.3 g/dL (ref 30.0–36.0)
MCV: 86.8 fL (ref 80.0–100.0)
Monocytes Absolute: 1 10*3/uL (ref 0.1–1.0)
Monocytes Relative: 5 %
Neutro Abs: 17.6 10*3/uL — ABNORMAL HIGH (ref 1.7–7.7)
Neutrophils Relative %: 88 %
Platelets: 249 10*3/uL (ref 150–400)
RBC: 4.84 MIL/uL (ref 4.22–5.81)
RDW: 13.7 % (ref 11.5–15.5)
WBC: 19.8 10*3/uL — ABNORMAL HIGH (ref 4.0–10.5)
nRBC: 0 % (ref 0.0–0.2)

## 2019-09-12 LAB — HEMOGLOBIN A1C
Hgb A1c MFr Bld: 9.3 % — ABNORMAL HIGH (ref 4.8–5.6)
Mean Plasma Glucose: 220.21 mg/dL

## 2019-09-12 LAB — URINE DRUG SCREEN, QUALITATIVE (ARMC ONLY)
Amphetamines, Ur Screen: NOT DETECTED
Barbiturates, Ur Screen: NOT DETECTED
Benzodiazepine, Ur Scrn: NOT DETECTED
Cannabinoid 50 Ng, Ur ~~LOC~~: POSITIVE — AB
Cocaine Metabolite,Ur ~~LOC~~: NOT DETECTED
MDMA (Ecstasy)Ur Screen: NOT DETECTED
Methadone Scn, Ur: NOT DETECTED
Opiate, Ur Screen: NOT DETECTED
Phencyclidine (PCP) Ur S: NOT DETECTED
Tricyclic, Ur Screen: NOT DETECTED

## 2019-09-12 LAB — COMPREHENSIVE METABOLIC PANEL
ALT: 78 U/L — ABNORMAL HIGH (ref 0–44)
AST: 114 U/L — ABNORMAL HIGH (ref 15–41)
Albumin: 4.8 g/dL (ref 3.5–5.0)
Alkaline Phosphatase: 124 U/L (ref 38–126)
Anion gap: 17 — ABNORMAL HIGH (ref 5–15)
BUN: 27 mg/dL — ABNORMAL HIGH (ref 6–20)
CO2: 22 mmol/L (ref 22–32)
Calcium: 9.9 mg/dL (ref 8.9–10.3)
Chloride: 98 mmol/L (ref 98–111)
Creatinine, Ser: 0.93 mg/dL (ref 0.61–1.24)
GFR calc Af Amer: >60 mL/min (ref >60)
GFR calc non Af Amer: >60 mL/min (ref >60)
Glucose, Bld: 360 mg/dL — ABNORMAL HIGH (ref 70–99)
Potassium: 5 mmol/L (ref 3.5–5.1)
Sodium: 137 mmol/L (ref 135–145)
Total Bilirubin: 1.4 mg/dL — ABNORMAL HIGH (ref 0.3–1.2)
Total Protein: 7.9 g/dL (ref 6.5–8.1)

## 2019-09-12 LAB — GLUCOSE, CAPILLARY
Glucose-Capillary: 261 mg/dL — ABNORMAL HIGH (ref 70–99)
Glucose-Capillary: 199 mg/dL — ABNORMAL HIGH (ref 70–99)
Glucose-Capillary: 241 mg/dL — ABNORMAL HIGH (ref 70–99)

## 2019-09-12 LAB — PROTIME-INR
INR: 0.9 (ref 0.8–1.2)
Prothrombin Time: 12.4 seconds (ref 11.4–15.2)

## 2019-09-12 LAB — LACTIC ACID, PLASMA
Lactic Acid, Venous: 1.7 mmol/L (ref 0.5–1.9)
Lactic Acid, Venous: 0.9 mmol/L (ref 0.5–1.9)

## 2019-09-12 LAB — BLOOD GAS, VENOUS
Patient temperature: 37
pCO2, Ven: 50 mmHg (ref 44.0–60.0)
pH, Ven: 7.34 (ref 7.250–7.430)
pO2, Ven: 39 mmHg (ref 32.0–45.0)

## 2019-09-12 LAB — POC SARS CORONAVIRUS 2 AG: SARS Coronavirus 2 Ag: NEGATIVE

## 2019-09-12 LAB — LIPASE, BLOOD: Lipase: 18 U/L (ref 11–51)

## 2019-09-12 MED ORDER — ONDANSETRON HCL 4 MG PO TABS: 4.0000 mg | ORAL_TABLET | Freq: Four times a day (QID) | ORAL | Status: DC | PRN

## 2019-09-12 MED ORDER — FAMOTIDINE IN NACL 20-0.9 MG/50ML-% IV SOLN
20.0000 mg | Freq: Once | INTRAVENOUS | Status: AC
  Administered 2019-09-12: 15:00:00 20 mg via INTRAVENOUS
  Filled 2019-09-12: qty 50

## 2019-09-12 MED ORDER — SODIUM CHLORIDE 0.9 % IV BOLUS
1000.0000 mL | Freq: Once | INTRAVENOUS | Status: AC
  Administered 2019-09-12: 1000 mL via INTRAVENOUS

## 2019-09-12 MED ORDER — MORPHINE SULFATE (PF) 2 MG/ML IV SOLN
2.0000 mg | INTRAVENOUS | Status: DC | PRN
  Administered 2019-09-12 – 2019-09-14 (×10): 2 mg via INTRAVENOUS
  Filled 2019-09-12 (×10): qty 1

## 2019-09-12 MED ORDER — ONDANSETRON HCL 4 MG/2ML IJ SOLN
4.0000 mg | Freq: Once | INTRAMUSCULAR | Status: AC
  Administered 2019-09-12: 13:00:00 4 mg via INTRAVENOUS
  Filled 2019-09-12: qty 2

## 2019-09-12 MED ORDER — PROMETHAZINE HCL 25 MG/ML IJ SOLN
12.5000 mg | Freq: Four times a day (QID) | INTRAMUSCULAR | Status: DC | PRN
  Administered 2019-09-12: 22:00:00 12.5 mg via INTRAVENOUS
  Filled 2019-09-12 (×2): qty 1

## 2019-09-12 MED ORDER — SODIUM CHLORIDE 0.9 % IV SOLN
80.0000 mg | Freq: Once | INTRAVENOUS | Status: DC
  Filled 2019-09-12: qty 80

## 2019-09-12 MED ORDER — CLINDAMYCIN PHOSPHATE 600 MG/50ML IV SOLN
600.0000 mg | Freq: Three times a day (TID) | INTRAVENOUS | Status: DC
  Administered 2019-09-13 – 2019-09-14 (×5): 600 mg via INTRAVENOUS
  Filled 2019-09-12 (×10): qty 50

## 2019-09-12 MED ORDER — INSULIN ASPART 100 UNIT/ML ~~LOC~~ SOLN
10.0000 [IU] | Freq: Once | SUBCUTANEOUS | Status: AC
  Administered 2019-09-12: 10 [IU] via INTRAVENOUS
  Filled 2019-09-12: qty 1

## 2019-09-12 MED ORDER — PANTOPRAZOLE SODIUM 40 MG IV SOLR: 40.0000 mg | Freq: Two times a day (BID) | INTRAVENOUS | Status: DC

## 2019-09-12 MED ORDER — SODIUM CHLORIDE 0.9 % IV SOLN: INTRAVENOUS | Status: DC

## 2019-09-12 MED ORDER — SODIUM CHLORIDE 0.9 % IV BOLUS
1000.0000 mL | Freq: Once | INTRAVENOUS | Status: AC
  Administered 2019-09-12: 15:00:00 1000 mL via INTRAVENOUS

## 2019-09-12 MED ORDER — INSULIN GLARGINE 100 UNIT/ML ~~LOC~~ SOLN
25.0000 [IU] | SUBCUTANEOUS | Status: AC
  Administered 2019-09-12: 23:00:00 25 [IU] via SUBCUTANEOUS
  Filled 2019-09-12: qty 0.25

## 2019-09-12 MED ORDER — INSULIN ASPART 100 UNIT/ML ~~LOC~~ SOLN
0.0000 [IU] | Freq: Three times a day (TID) | SUBCUTANEOUS | Status: DC
  Administered 2019-09-13 (×2): 1 [IU] via SUBCUTANEOUS
  Administered 2019-09-14 (×2): 2 [IU] via SUBCUTANEOUS
  Filled 2019-09-12 (×4): qty 1

## 2019-09-12 MED ORDER — ONDANSETRON HCL 4 MG/2ML IJ SOLN
4.0000 mg | Freq: Four times a day (QID) | INTRAMUSCULAR | Status: DC
  Administered 2019-09-12 – 2019-09-14 (×7): 4 mg via INTRAVENOUS
  Filled 2019-09-12 (×7): qty 2

## 2019-09-12 MED ORDER — INSULIN ASPART 100 UNIT/ML ~~LOC~~ SOLN
0.0000 [IU] | Freq: Every day | SUBCUTANEOUS | Status: DC
  Administered 2019-09-12 – 2019-09-13 (×2): 2 [IU] via SUBCUTANEOUS
  Filled 2019-09-12 (×2): qty 1

## 2019-09-12 MED ORDER — INSULIN GLARGINE 100 UNIT/ML ~~LOC~~ SOLN
25.0000 [IU] | Freq: Every day | SUBCUTANEOUS | Status: DC
  Filled 2019-09-12: qty 0.25

## 2019-09-12 MED ORDER — ONDANSETRON HCL 4 MG/2ML IJ SOLN: 4.0000 mg | Freq: Four times a day (QID) | INTRAMUSCULAR | Status: DC | PRN

## 2019-09-12 MED ORDER — CLINDAMYCIN PHOSPHATE 600 MG/50ML IV SOLN
600.0000 mg | Freq: Once | INTRAVENOUS | Status: AC
  Administered 2019-09-12: 600 mg via INTRAVENOUS
  Filled 2019-09-12: qty 50

## 2019-09-12 MED ORDER — GABAPENTIN 100 MG PO CAPS
200.0000 mg | ORAL_CAPSULE | Freq: Every day | ORAL | Status: DC
  Administered 2019-09-12 – 2019-09-13 (×2): 200 mg via ORAL
  Filled 2019-09-12 (×2): qty 2

## 2019-09-12 MED ORDER — ACETAMINOPHEN 325 MG PO TABS
650.0000 mg | ORAL_TABLET | Freq: Four times a day (QID) | ORAL | Status: DC | PRN
  Administered 2019-09-13: 650 mg via ORAL
  Filled 2019-09-12: qty 2

## 2019-09-12 MED ORDER — ACETAMINOPHEN 650 MG RE SUPP: 650.0000 mg | Freq: Four times a day (QID) | RECTAL | Status: DC | PRN

## 2019-09-12 MED ORDER — IOHEXOL 300 MG/ML  SOLN
75.0000 mL | Freq: Once | INTRAMUSCULAR | Status: AC | PRN
  Administered 2019-09-12: 17:00:00 75 mL via INTRAVENOUS

## 2019-09-12 NOTE — ED Triage Notes (Signed)
Pt to ED via [facility] from home for blood in his vomit and also tooth pain. Pt states that he started vomiting this morning. Pt had 1 episode of vomiting with EMS. Pt coughing and vomiting in triage. Blood noted in vomit.

## 2019-09-12 NOTE — ED Notes (Signed)
Pt refused second COVID swab. Consulting civil engineer notified. Floor notified.

## 2019-09-12 NOTE — ED Notes (Signed)
Attempted IV stick, unsuccessful. Asked Royce RN to look with [place] for IV and blood draw.

## 2019-09-12 NOTE — H&P (Signed)
[facility] [place] [facility]   PATIENT NAME: Trevor Brown    MR#:  [id number]  DATE OF BIRTH:  17-May-1991  DATE OF ADMISSION:  09/12/2019  PRIMARY CARE PHYSICIAN: None  REQUESTING/REFERRING PHYSICIAN: Cari Triplett  CHIEF COMPLAINT:   Chief Complaint  Patient presents with  . Hematemesis  . Dental Pain    HISTORY OF PRESENT ILLNESS:  Trevor Brown  is a 29 y.o. male with a known history of type 1 diabetes presents to the ER with nausea vomiting going on for 2 days.  He states he is also had a mouth infection going on for last 2 days.  He is having body aches.  He is having pain all over his body 9 out of 10 intensity of his neck jaw and legs.  He last took his insulin yesterday morning.  No complaints of fever.  He is vomiting blood and it was even coming out of his nose.  He occasionally has some abdominal discomfort.  In the ER, he had a pH of 7.34, positive cannabis in the urine toxicology, elevated white blood cell count of 19.8 and elevated sugar of 360.  He also had elevated liver function tests.  Hospitalist were contacted for further evaluation.  Patient asking for pain medication.  PAST MEDICAL HISTORY:   Past Medical History:  Diagnosis Date  . Heart attack (HCC)    Pt claims he had heart attack a year ago ( 2015)- and was admitted in [facility] for that, but not given any meds or angiogram, on review of chart- I could not find any details like that.  . Hepatitis C, acute may 2016  . Heroin abuse (HCC)   . History of noncompliance with medical treatment   . Hypertension   . Type 1 diabetes (HCC)     PAST SURGICAL HISTORY:   Past Surgical History:  Procedure Laterality Date  . NO PAST SURGERIES    . none      SOCIAL HISTORY:   Social History   Tobacco Use  . Smoking status: Current Every Day Smoker    Packs/day: 2.00    Types: Cigarettes  . Smokeless tobacco: Never Used  Substance Use Topics  . Alcohol use: No    Alcohol/week: 0.0 standard  drinks    FAMILY HISTORY:   Family History  Problem Relation Age of Onset  . Cirrhosis Mother   . Diabetes Mellitus II Maternal Grandmother   . Diabetes Mellitus II Maternal Grandfather   . CAD Father     DRUG ALLERGIES:   Allergies  Allergen Reactions  . Bee Venom Anaphylaxis  . Penicillins Anaphylaxis, Hives and Other (See Comments)    Has patient had a PCN reaction causing immediate rash, facial/tongue/throat swelling, SOB or lightheadedness with hypotension: Yes Has patient had a PCN reaction causing severe rash involving mucus membranes or skin necrosis: No Has patient had a PCN reaction that required hospitalization No Has patient had a PCN reaction occurring within the last 10 years: Yes If all of the above answers are "NO", then may proceed with Cephalosporin use.  . Ibuprofen Itching, Rash and Other (See Comments)    Other reaction(s): Other (See Comments) Stomach upset Reaction:  GI upset   . Tramadol Hives and Swelling  . Vancomycin Rash and Other (See Comments)    Reaction:  Red man's syndrome     REVIEW OF SYSTEMS:  CONSTITUTIONAL: No fever, chills or sweats.  Positive for fatigue.  EYES: No blurred or double  vision.  EARS, NOSE, AND THROAT:  Positive for sore throat and mouth pain RESPIRATORY: No cough, shortness of breath, wheezing or hemoptysis.  CARDIOVASCULAR: No chest pain, orthopnea, edema.  GASTROINTESTINAL: Positive for nausea and vomiting.  Positive for hematemesis.  Occasional abdominal pain. GENITOURINARY: No dysuria, hematuria.  ENDOCRINE: No polyuria, nocturia,  HEMATOLOGY: No anemia, easy bruising or bleeding SKIN: No rash or lesion. MUSCULOSKELETAL: Positive for joint pain all over. NEUROLOGIC: No tingling, numbness, weakness.  PSYCHIATRY: No anxiety or depression.   MEDICATIONS AT HOME:   Prior to Admission medications   Medication Sig Start Date End Date Taking? Authorizing Provider  gabapentin (NEURONTIN) 100 MG capsule Take 2  capsules (200 mg total) by mouth at bedtime. 06/28/19  Yes Iloabachie, [name], NP  insulin aspart (NOVOLOG) 100 UNIT/ML injection Inject 5-10 Units into the skin See admin instructions. INJECT BEFORE MEALS PER SLIDING SCALE AS DIRECTED: 80-250 = 5 UNITS;251-350 = 8 UNITS; OVER 350 = 10 UNITS 08/10/19  Yes Therisa Doyne, MD  Insulin Glargine (BASAGLAR KWIKPEN) 100 UNIT/ML SOPN Inject 0.25 mLs (25 Units total) into the skin daily. 08/12/19 09/12/19 Yes Iloabachie, [name], NP  glucose blood (FREESTYLE LITE) test strip To be used as directed for checking her blood sugar 06/28/19   Iloabachie, [name], NP      VITAL SIGNS:  Blood pressure (!) 148/88, pulse 89, resp. rate 14, height 5\' 4"  (1.626 m), weight 54.4 kg, SpO2 100 %.  PHYSICAL EXAMINATION:  GENERAL:  29 y.o.-year-old patient lying in the bed with no acute distress.  EYES: Pupils equal, round, reactive to light and accommodation. No scleral icterus. Extraocular muscles intact.  HEENT: Head atraumatic, normocephalic.  Poor dentition with most of his teeth nonexistent down to the gums..  Pain to palpation left lower teeth. NECK:  Supple, no jugular venous distention. No thyroid enlargement, no tenderness.  LUNGS: Normal breath sounds bilaterally, no wheezing, rales,rhonchi or crepitation. No use of accessory muscles of respiration.  CARDIOVASCULAR: S1, S2 normal. No murmurs, rubs, or gallops.  ABDOMEN: Soft, nontender, nondistended. Bowel sounds present. No organomegaly or mass.  EXTREMITIES: No pedal edema, cyanosis, or clubbing.  NEUROLOGIC: Cranial nerves II through XII are intact. Muscle strength 5/5 in all extremities. Sensation intact. Gait not checked.  PSYCHIATRIC: The patient is alert and oriented x 3.  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  LABORATORY PANEL:   CBC Recent Labs  Lab 09/12/19 1212  WBC 19.8*  HGB 14.4  HCT 42.0  PLT 249    ------------------------------------------------------------------------------------------------------------------  Chemistries  Recent Labs  Lab 09/12/19 1212  NA 137  K 5.0  CL 98  CO2 22  GLUCOSE 360*  BUN 27*  CREATININE 0.93  CALCIUM 9.9  AST 114*  ALT 78*  ALKPHOS 124  BILITOT 1.4*   ------------------------------------------------------------------------------------------------------------------   RADIOLOGY:  CT Maxillofacial W Contrast  Result Date: 09/12/2019 CLINICAL DATA:  Dental abscess and leukocytosis. EXAM: CT MAXILLOFACIAL WITH CONTRAST TECHNIQUE: Multidetector CT imaging of the maxillofacial structures was performed with intravenous contrast. Multiplanar CT image reconstructions were also generated. CONTRAST:  73mL OMNIPAQUE IOHEXOL 300 MG/ML  SOLN COMPARISON:  None. FINDINGS: Osseous: No facial fracture. Dental: There is poor dentition. There are large periapical lucencies at the roots of the mandibular central and lateral incisors. There is mild adjacent soft tissue swelling. There is dehiscence of the anterior mandibular cortex at the mandibular symphysis. No subperiosteal abscess. Severe carious disease of all of the teeth.  Orbits: The globes are intact. Normal appearance of the intra- and extraconal fat. Symmetric extraocular muscles. Sinuses: No fluid levels or advanced mucosal thickening. Soft tissues: Normal visualized extracranial soft tissues. Limited intracranial: Normal. IMPRESSION: Severe dental disease with large periapical lucencies at the roots of teeth 23-26 and cortical dehiscence of the anterior aspect of the mandibular symphysis. Electronically Signed   By: Ulyses Jarred M.D.   On: 09/12/2019 17:08   DG Chest Portable 1 View  Result Date: 09/12/2019 CLINICAL DATA:  Weakness with vomiting as well as coughing. Toothache. EXAM: PORTABLE CHEST 1 VIEW COMPARISON:  04/11/2019 FINDINGS: Lungs are adequately inflated without focal airspace consolidation  or effusion. Cardiomediastinal silhouette, bones and soft tissues are normal. IMPRESSION: No active disease. Electronically Signed   By: Marin Olp M.D.   On: 09/12/2019 13:15    EKG:   Normal sinus rhythm 89 bpm, right atrial enlargement.  Interpreted by me.  IMPRESSION AND PLAN:   1.  Dental abscess with leukocytosis.  Patient has allergy to penicillin and vancomycin so I will have to give clindamycin at this time.  Patient has poor dentition I do recommend he follow-up at the [facility] upon discharge.  Likely will have to remove all of his teeth roots.  Follow-up blood cultures to make sure this is not endocarditis. 2.  Type 1 diabetes mellitus uncontrolled.  With pH normal, I do not believe this is diabetic ketoacidosis at this time.  Check a hemoglobin A1c will give Lantus 25 units stat.  Give another fluid bolus.  Insulin sliding scale. 3.  Persistent nausea vomiting and hematemesis.  Serial hemoglobins.  Case discussed with gastroenterology and they will see in the a.m.  IV Protonix.  Will give Zofran standing dose.  As needed Phenergan if Zofran does not work.  This also could be cannabis hyperemesis syndrome since the patient does have cannabis in his urine toxicology. 4.  Hepatitis C with elevated liver function test.  Previous CAT scan did not show any signs of cirrhosis. 5.  Covid swab ordered.   All the records are reviewed and case discussed with ED provider. Management plans discussed with the patient, and he is agreement.  Tried to reach the patient's girlfriend at his request but no answer on her phone listed in the computer.  CODE STATUS: full code  TOTAL TIME TAKING CARE OF THIS PATIENT: 50 minutes.    Loletha Grayer M.D on 09/12/2019 at 5:34 PM  Between 7am to 6pm - Pager - [phone]  After 6pm call admission pager [phone]  [facility]  CC: Primary care physician; Langston Reusing, NP

## 2019-09-12 NOTE — ED Provider Notes (Signed)
[facility] Emergency Department Provider Note ____________________________________________   None    (approximate)  I have reviewed the triage vital signs and the nursing notes.   HISTORY  Chief Complaint Hematemesis and Dental Pain  HPI Trevor Brown is a 29 y.o. male presenting to the emergency department for treatment and evaluation of hematemesis and dental pain.  Significant past medical history of type 1 diabetes, intravenous drug user, and hepatitis C. He isn't sure how many times he has vomited. Dental pain started 2-3 days ago. He denies recent IV drug or alcohol use.       Past Medical History:  Diagnosis Date  . Heart attack (HCC)    Pt claims he had heart attack a year ago ( 2015)- and was admitted in [facility] for that, but not given any meds or angiogram, on review of chart- I could not find any details like that.  . Hepatitis C, acute may 2016  . Heroin abuse (HCC)   . History of noncompliance with medical treatment   . Hypertension   . Type 1 diabetes [facility])     Patient Active Problem List   Diagnosis Date Noted  . Dental abscess 09/12/2019  . Intractable vomiting   . Hematemesis with nausea   . Cannabis hyperemesis syndrome concurrent with and due to cannabis abuse (HCC)   . Elevated LFTs   . Abnormal laboratory test result 07/20/2019  . Smoking 07/20/2019  . History of hepatitis C 02/25/2019  . Health care maintenance 02/25/2019  . Peripheral neuropathy 02/25/2019  . Hand abscess 10/13/2018  . IVDU (intravenous drug user) 10/11/2018  . Hyperkalemia 10/11/2018  . Cellulitis and abscess of hand 08/17/2018  . Abdominal pain 06/29/2017  . Hyponatremia 06/29/2017  . DKA (diabetic ketoacidosis) (HCC) 06/29/2017  . Dental erosion extending into pulp 12/31/2016  . 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 Elevated transaminase level 03/27/2016  . Diabetes mellitus type 1 (HCC) 03/27/2016  . Tobacco abuse counseling 03/27/2016  . Transaminitis  03/25/2016  . Leukocytosis 01/13/2016  . Tobacco abuse 01/10/2016  . Type 1 diabetes mellitus with hyperglycemia (HCC) 12/12/2015  . Cocaine abuse (HCC) 12/12/2015  . DKA (diabetic ketoacidoses) (HCC) 06/10/2015  . Hidradenitis suppurativa of left axilla   . DKA, type 1 (HCC) 06/06/2015  . Malnutrition of moderate degree (HCC) 04/08/2015  . Hepatitis C 12/13/2014  . Diabetes type 1, uncontrolled (HCC) 12/11/2014  . Major depressive disorder, single episode, mild (HCC)   . Major depression, single episode 12/10/2014  . Diabetes mellitus type 1, uncontrolled (HCC) 12/09/2014  . Homelessness 12/09/2014    Past Surgical History:  Procedure Laterality Date  . NO PAST SURGERIES    . none      Prior to Admission medications   Medication Sig Start Date End Date Taking? Authorizing Provider  gabapentin (NEURONTIN) 100 MG capsule Take 2 capsules (200 mg total) by mouth at bedtime. 06/28/19  Yes Iloabachie, [name], NP  insulin aspart (NOVOLOG) 100 UNIT/ML injection Inject 5-10 Units into the skin See admin instructions. INJECT BEFORE MEALS PER SLIDING SCALE AS DIRECTED: 80-250 = 5 UNITS;251-350 = 8 UNITS; OVER 350 = 10 UNITS 08/10/19  Yes Therisa Doyne, MD  Insulin Glargine (BASAGLAR KWIKPEN) 100 UNIT/ML SOPN Inject 0.25 mLs (25 Units total) into the skin daily. 08/12/19 09/12/19 Yes Iloabachie, [name], NP  glucose blood (FREESTYLE LITE) test strip To be used as directed for checking her blood sugar 06/28/19   Iloabachie, [name], NP  Allergies Bee venom, Penicillins, Ibuprofen, Tramadol, and Vancomycin  Family History  Problem Relation Age of Onset  . Cirrhosis Mother   . Diabetes Mellitus II Maternal Grandmother   . Diabetes Mellitus II Maternal Grandfather   . CAD Father     Social History Social History   Tobacco Use  . Smoking status: Current Every Day Smoker    Packs/day: 2.00    Types: Cigarettes  . Smokeless tobacco: Never Used  Substance Use Topics  . Alcohol use:  No    Alcohol/week: 0.0 standard drinks  . Drug use: Not Currently    Types: IV, Heroin, Marijuana    Comment: heroin- last used October 04 2018    Review of Systems  Constitutional: No fever/chills Eyes: No visual changes. ENT: No sore throat. Positive for dental pain. Cardiovascular: Denies chest pain. Respiratory: Denies shortness of breath. Gastrointestinal: Positive for nausea, vomiting, and abdominal pain. Genitourinary: Negative for dysuria. Musculoskeletal: Negative for back pain. Skin: Negative for rash. Neurological: Negative for headaches, focal weakness or numbness. ___________________________________________   PHYSICAL EXAM:  VITAL SIGNS: ED Triage Vitals  Enc Vitals Group     BP 09/12/19 1205 (!) 150/89     Pulse Rate 09/12/19 1205 (!) 108     Resp 09/12/19 1205 16     Temp --      Temp src --      SpO2 09/12/19 1205 100 %     Weight 09/12/19 1207 120 lb (54.4 kg)     Height 09/12/19 1207 5\' 4"  (1.626 m)     Head Circumference --      Peak Flow --      Pain Score 09/12/19 1207 10     Pain Loc --      Pain Edu? --      Excl. in GC? --     Constitutional: Alert and oriented.  Ill appearing and in no acute distress. Eyes: Conjunctivae are normal. PERRL. Head: Atraumatic. Nose: No congestion/rhinnorhea. Mouth/Throat: Mucous membranes are moist.  Oropharynx non-erythematous. Neck: No stridor.   Hematological/Lymphatic/Immunilogical: No cervical lymphadenopathy. Cardiovascular: Normal rate, regular rhythm. Grossly normal heart sounds.  Good peripheral circulation. Respiratory: Normal respiratory effort.  No retractions. Lungs CTAB. Gastrointestinal: Soft and nontender. No distention. No abdominal bruits. No CVA tenderness. Musculoskeletal: No lower extremity tenderness nor edema.  No joint effusions. Neurologic:  Normal speech and language. No gross focal neurologic deficits are appreciated. No gait instability. Skin:  Skin is warm, dry and intact. No rash  noted. Psychiatric: Mood and affect are normal. Speech and behavior are normal.  ____________________________________________   LABS (all labs ordered are listed, but only abnormal results are displayed)  Labs Reviewed  COMPREHENSIVE METABOLIC PANEL - Abnormal; Notable for the following components:      Result Value   Glucose, Bld 360 (*)    BUN 27 (*)    AST 114 (*)    ALT 78 (*)    Total Bilirubin 1.4 (*)    Anion gap 17 (*)    All other components within normal limits  CBC WITH DIFFERENTIAL/PLATELET - Abnormal; Notable for the following components:   WBC 19.8 (*)    Neutro Abs 17.6 (*)    Abs Immature Granulocytes 0.09 (*)    All other components within normal limits  URINALYSIS, COMPLETE (UACMP) WITH MICROSCOPIC - Abnormal; Notable for the following components:   Color, Urine YELLOW (*)    APPearance CLEAR (*)    Glucose, UA >=500 (*)  Ketones, ur 20 (*)    All other components within normal limits  URINE DRUG SCREEN, QUALITATIVE ([facility] ONLY) - Abnormal; Notable for the following components:   Cannabinoid 50 Ng, Ur  POSITIVE (*)    All other components within normal limits  ETHANOL - Abnormal; Notable for the following components:   Alcohol, Ethyl (B) 14 (*)    All other components within normal limits  GLUCOSE, CAPILLARY - Abnormal; Notable for the following components:   Glucose-Capillary 261 (*)    All other components within normal limits  GLUCOSE, CAPILLARY - Abnormal; Notable for the following components:   Glucose-Capillary 199 (*)    All other components within normal limits  CULTURE, BLOOD (ROUTINE X 2)  CULTURE, BLOOD (ROUTINE X 2)  SARS CORONAVIRUS 2 (TAT 6-24 HRS)  LIPASE, BLOOD  LACTIC ACID, PLASMA  PROTIME-INR  BLOOD GAS, VENOUS  LACTIC ACID, PLASMA  HEMOGLOBIN A1C  BASIC METABOLIC PANEL  CBC  POC SARS CORONAVIRUS 2 AG -  ED  POC SARS CORONAVIRUS 2 AG   ____________________________________________  EKG  ED ECG REPORT I, Shareef Eddinger,  FNP-BC personally viewed and interpreted this ECG.   Date: 09/12/2019  EKG Time: 1214  Rate: 89  Rhythm: normal EKG, normal sinus rhythm  Axis: borderline right axis  Intervals:none  ST&T Change: no ST elevation  ____________________________________________  RADIOLOGY  ED MD interpretation:    Chest x-ray is negative for acute cardiopulmonary abnormality.  I, Kem Boroughs, personally viewed and evaluated these images (plain radiographs) as part of my medical decision making, as well as reviewing the written report by the radiologist.  Official radiology report(s): CT Maxillofacial W Contrast  Result Date: 09/12/2019 CLINICAL DATA:  Dental abscess and leukocytosis. EXAM: CT MAXILLOFACIAL WITH CONTRAST TECHNIQUE: Multidetector CT imaging of the maxillofacial structures was performed with intravenous contrast. Multiplanar CT image reconstructions were also generated. CONTRAST:  1mL OMNIPAQUE IOHEXOL 300 MG/ML  SOLN COMPARISON:  None. FINDINGS: Osseous: No facial fracture. Dental: There is poor dentition. There are large periapical lucencies at the roots of the mandibular central and lateral incisors. There is mild adjacent soft tissue swelling. There is dehiscence of the anterior mandibular cortex at the mandibular symphysis. No subperiosteal abscess. Severe carious disease of all of the teeth. Orbits: The globes are intact. Normal appearance of the intra- and extraconal fat. Symmetric extraocular muscles. Sinuses: No fluid levels or advanced mucosal thickening. Soft tissues: Normal visualized extracranial soft tissues. Limited intracranial: Normal. IMPRESSION: Severe dental disease with large periapical lucencies at the roots of teeth 23-26 and cortical dehiscence of the anterior aspect of the mandibular symphysis. Electronically Signed   By: Deatra Robinson M.D.   On: 09/12/2019 17:08   DG Chest Portable 1 View  Result Date: 09/12/2019 CLINICAL DATA:  Weakness with vomiting as well as  coughing. Toothache. EXAM: PORTABLE CHEST 1 VIEW COMPARISON:  04/11/2019 FINDINGS: Lungs are adequately inflated without focal airspace consolidation or effusion. Cardiomediastinal silhouette, bones and soft tissues are normal. IMPRESSION: No active disease. Electronically Signed   By: Elberta Fortis M.D.   On: 09/12/2019 13:15    ____________________________________________   PROCEDURES  Procedure(s) performed (including Critical Care):  Procedures  ____________________________________________   INITIAL IMPRESSION / ASSESSMENT AND PLAN    29 year old male presenting to the emergency department for treatment and evaluation after experiencing bloody emesis.  He is also experiencing some dental pain with left lower facial swelling.   DIFFERENTIAL DIAGNOSIS  Upper GI bleed, DKA, sepsis, COVID-19  ED COURSE  Patient has experienced 1 episode of coffee-ground emesis.  VBG is reassuring with a pH of 7.34.  Lactic acid is reassuring as well at 1.7.  Glucose is 360 with an anion gap of 17.  Total bilirubin is elevated at 1.4.  AST and ALT are 114/78 which seems somewhat higher than patient's baseline in comparison to his last visit 2 months ago.  BUN is elevated at 27 which is near baseline.  Hemoglobin and hematocrit are reassuring at 14.4 and 42.0.  White blood cell count is elevated at 19.8 with a left shift.  This is most likely secondary to his dental abscess.  He has an allergy to penicillins therefore clindamycin has been ordered.  He will also be given 10 units of IV insulin and additional fluids.  ----------------------------------------- 3:45 PM on 09/12/2019 -----------------------------------------   Patient remains tachycardic even after fluids. Glucose down to 261. Plan will be to admit. Hosptialist consult requested. ____________________________________________   FINAL CLINICAL IMPRESSION(S) / ED DIAGNOSES  Final diagnoses:  Acute upper GI bleed  Diabetic ketoacidosis  without coma associated with type 1 diabetes mellitus ([place])  Dental abscess  Hematemesis with nausea     ED Discharge Orders    None       Trevor Brown was evaluated in Emergency Department on 09/12/2019 for the symptoms described in the history of present illness. He was evaluated in the context of the global COVID-19 pandemic, which necessitated consideration that the patient might be at risk for infection with the SARS-CoV-2 virus that causes COVID-19. Institutional protocols and algorithms that pertain to the evaluation of patients at risk for COVID-19 are in a state of rapid change based on information released by regulatory bodies including the CDC and federal and state organizations. These policies and algorithms were followed during the patient's care in the ED.   Note:  This document was prepared using Dragon voice recognition software and may include unintentional dictation errors.   Victorino Dike, FNP 09/12/19 2025    Carrie Mew, MD 09/13/19 [id number]

## 2019-09-13 ENCOUNTER — Encounter
Admission: EM | Disposition: A | Discharge status: Home / Self Care | Payer: Self-pay | Source: Home / Self Care | Attending: Internal Medicine

## 2019-09-13 ENCOUNTER — Encounter: Payer: Self-pay | Admitting: Internal Medicine

## 2019-09-13 ENCOUNTER — Encounter: Payer: Self-pay | Admitting: Certified Registered"

## 2019-09-13 ENCOUNTER — Ambulatory Visit: Admit: 2019-09-13 | Payer: Medicaid Other | Admitting: Gastroenterology

## 2019-09-13 DIAGNOSIS — F1290 Cannabis use, unspecified, uncomplicated: Secondary | ICD-10-CM

## 2019-09-13 DIAGNOSIS — K920 Hematemesis: Secondary | ICD-10-CM

## 2019-09-13 DIAGNOSIS — A419 Sepsis, unspecified organism: Principal | ICD-10-CM

## 2019-09-13 DIAGNOSIS — E10649 Type 1 diabetes mellitus with hypoglycemia without coma: Secondary | ICD-10-CM

## 2019-09-13 LAB — BASIC METABOLIC PANEL
Anion gap: 12 (ref 5–15)
BUN: 24 mg/dL — ABNORMAL HIGH (ref 6–20)
CO2: 18 mmol/L — ABNORMAL LOW (ref 22–32)
Calcium: 8.5 mg/dL — ABNORMAL LOW (ref 8.9–10.3)
Chloride: 110 mmol/L (ref 98–111)
Creatinine, Ser: 0.99 mg/dL (ref 0.61–1.24)
GFR calc Af Amer: >60 mL/min (ref >60)
GFR calc non Af Amer: >60 mL/min (ref >60)
Glucose, Bld: 192 mg/dL — ABNORMAL HIGH (ref 70–99)
Potassium: 4 mmol/L (ref 3.5–5.1)
Sodium: 140 mmol/L (ref 135–145)

## 2019-09-13 LAB — GLUCOSE, CAPILLARY
Glucose-Capillary: 141 mg/dL — ABNORMAL HIGH (ref 70–99)
Glucose-Capillary: 66 mg/dL — ABNORMAL LOW (ref 70–99)
Glucose-Capillary: 128 mg/dL — ABNORMAL HIGH (ref 70–99)
Glucose-Capillary: 50 mg/dL — ABNORMAL LOW (ref 70–99)
Glucose-Capillary: 147 mg/dL — ABNORMAL HIGH (ref 70–99)
Glucose-Capillary: 225 mg/dL — ABNORMAL HIGH (ref 70–99)

## 2019-09-13 LAB — SARS CORONAVIRUS 2 (TAT 6-24 HRS): SARS Coronavirus 2: NEGATIVE

## 2019-09-13 LAB — CBC
HCT: 34 % — ABNORMAL LOW (ref 39.0–52.0)
Hemoglobin: 11.4 g/dL — ABNORMAL LOW (ref 13.0–17.0)
MCH: 29.8 pg (ref 26.0–34.0)
MCHC: 33.5 g/dL (ref 30.0–36.0)
MCV: 89 fL (ref 80.0–100.0)
Platelets: 226 10*3/uL (ref 150–400)
RBC: 3.82 MIL/uL — ABNORMAL LOW (ref 4.22–5.81)
RDW: 14 % (ref 11.5–15.5)
WBC: 19.4 10*3/uL — ABNORMAL HIGH (ref 4.0–10.5)
nRBC: 0 % (ref 0.0–0.2)

## 2019-09-13 LAB — MRSA PCR SCREENING: MRSA by PCR: NEGATIVE

## 2019-09-13 SURGERY — ESOPHAGOGASTRODUODENOSCOPY (EGD) WITH PROPOFOL
Anesthesia: General

## 2019-09-13 MED ORDER — INSULIN GLARGINE 100 UNIT/ML ~~LOC~~ SOLN
25.0000 [IU] | Freq: Every day | SUBCUTANEOUS | Status: DC
  Filled 2019-09-13 (×2): qty 0.25

## 2019-09-13 MED ORDER — DEXTROSE 50 % IV SOLN
INTRAVENOUS | Status: AC
  Filled 2019-09-13: qty 50

## 2019-09-13 MED ORDER — INSULIN GLARGINE 100 UNIT/ML ~~LOC~~ SOLN
20.0000 [IU] | Freq: Every day | SUBCUTANEOUS | Status: DC
  Administered 2019-09-13 – 2019-09-14 (×2): 20 [IU] via SUBCUTANEOUS
  Filled 2019-09-13 (×2): qty 0.2

## 2019-09-13 MED ORDER — PANTOPRAZOLE SODIUM 40 MG IV SOLR
40.0000 mg | Freq: Two times a day (BID) | INTRAVENOUS | Status: DC
  Administered 2019-09-13 – 2019-09-14 (×2): 40 mg via INTRAVENOUS
  Filled 2019-09-13 (×3): qty 40

## 2019-09-13 MED ORDER — PROPOFOL 10 MG/ML IV BOLUS
INTRAVENOUS | Status: AC
  Filled 2019-09-13: qty 40

## 2019-09-13 MED ORDER — GLYCOPYRROLATE 0.2 MG/ML IJ SOLN
INTRAMUSCULAR | Status: AC
  Filled 2019-09-13: qty 1

## 2019-09-13 MED ORDER — DEXTROSE 50 % IV SOLN
12.5000 g | INTRAVENOUS | Status: AC
  Administered 2019-09-13: 12.5 g via INTRAVENOUS

## 2019-09-13 MED ORDER — DEXTROSE-NACL 5-0.45 % IV SOLN: INTRAVENOUS | Status: DC

## 2019-09-13 NOTE — Progress Notes (Signed)
Patient ID: Trevor Brown, male   DOB: 12/03/1990, 29 y.o.   MRN: [id number] [facility] PROGRESS NOTE  STEPHFON BOVEY UDJ:[id number] DOB: 1991-04-16 DOA: 09/12/2019 PCP: Rolm Gala, NP  HPI/Subjective: Patient feeling hurting all over.  Still asking for some pain medication.  Still pain in his mouth and swelling.  Did not vomit this morning when I saw him but did vomit last night.  Objective: Vitals:   09/13/19 1000 09/13/19 1009  BP:  135/70  Pulse: 94 96  Resp:  17  Temp:  98.7 F (37.1 C)  SpO2:  100%    Intake/Output Summary (Last 24 hours) at 09/13/2019 1544 Last data filed at 09/13/2019 0830 Gross per 24 hour  Intake 2876.54 ml  Output 1725 ml  Net 1151.54 ml   Filed Weights   09/12/19 1207  Weight: 54.4 kg    ROS: Review of Systems  Constitutional: Negative for chills and fever.  Eyes: Negative for blurred vision.  Respiratory: Negative for cough and shortness of breath.   Cardiovascular: Negative for chest pain.  Gastrointestinal: Positive for abdominal pain and nausea. Negative for constipation, diarrhea and vomiting.  Genitourinary: Negative for dysuria.  Musculoskeletal: Positive for joint pain and neck pain.  Neurological: Negative for dizziness and headaches.   Exam: Physical Exam  Constitutional: He is oriented to person, place, and time.  HENT:  Nose: No mucosal edema.  Mouth/Throat: No oropharyngeal exudate or posterior oropharyngeal edema.  Poor dentition and tenderness and swelling around lower left teeth.  Eyes: Conjunctivae and lids are normal.  Neck: Carotid bruit is not present.  Cardiovascular: S1 normal and S2 normal. Exam reveals no gallop.  No murmur heard. Pulses:      Dorsalis pedis pulses are 2+ on the right side and 2+ on the left side.  Respiratory: No respiratory distress. He has no wheezes. He has no rhonchi. He has no rales.  GI: Soft. Bowel sounds are normal. There is no abdominal tenderness.  Musculoskeletal:      Right ankle: No swelling.     Left ankle: No swelling.  Lymphadenopathy:    He has no cervical adenopathy.  Neurological: He is alert and oriented to person, place, and time. No cranial nerve deficit.  Skin: Skin is warm. No rash noted. Nails show no clubbing.  Psychiatric: He has a normal mood and affect.      Data Reviewed: Basic Metabolic Panel: Recent Labs  Lab 09/12/19 1212 09/13/19 0358  NA 137 140  K 5.0 4.0  CL 98 110  CO2 22 18*  GLUCOSE 360* 192*  BUN 27* 24*  CREATININE 0.93 0.99  CALCIUM 9.9 8.5*   Liver Function Tests: Recent Labs  Lab 09/12/19 1212  AST 114*  ALT 78*  ALKPHOS 124  BILITOT 1.4*  PROT 7.9  ALBUMIN 4.8   Recent Labs  Lab 09/12/19 1212  LIPASE 18   CBC: Recent Labs  Lab 09/12/19 1212 09/13/19 0358  WBC 19.8* 19.4*  NEUTROABS 17.6*  --   HGB 14.4 11.4*  HCT 42.0 34.0*  MCV 86.8 89.0  PLT 249 226    CBG: Recent Labs  Lab 09/12/19 1845 09/12/19 2206 09/13/19 0810 09/13/19 1211 09/13/19 1300  GLUCAP 199* 241* 141* 66* 128*    Recent Results (from the past 240 hour(s))  SARS CORONAVIRUS 2 (TAT 6-24 HRS) Nasopharyngeal Nasopharyngeal Swab     Status: None   Collection Time: 09/12/19  8:24 PM   Specimen: Nasopharyngeal Swab  Result Value  Ref Range Status   SARS Coronavirus 2 NEGATIVE NEGATIVE Final    Comment: (NOTE) SARS-CoV-2 target nucleic acids are NOT DETECTED. The SARS-CoV-2 RNA is generally detectable in upper and lower respiratory specimens during the acute phase of infection. Negative results do not preclude SARS-CoV-2 infection, do not rule out co-infections with other pathogens, and should not be used as the sole basis for treatment or other patient management decisions. Negative results must be combined with clinical observations, patient history, and epidemiological information. The expected result is Negative. Fact Sheet for Patients: HairSlick.no Fact Sheet for  Healthcare Providers: quierodirigir.com This test is not yet approved or cleared by the [place] FDA and  has been authorized for detection and/or diagnosis of SARS-CoV-2 by FDA under an Emergency Use Authorization (EUA). This EUA will remain  in effect (meaning this test can be used) for the duration of the COVID-19 declaration under Section 56 4(b)(1) of the Act, 21 U.S.C. section 360bbb-3(b)(1), unless the authorization is terminated or revoked sooner. Performed at [facility], [address], [place], [place] [address]   Culture, blood (Routine X 2) w Reflex to ID Panel     Status: None (Preliminary result)   Collection Time: 09/12/19  9:26 PM   Specimen: BLOOD  Result Value Ref Range Status   Specimen Description BLOOD RIGHT ANTECUBITAL  Final   Special Requests   Final    BOTTLES DRAWN AEROBIC AND ANAEROBIC Blood Culture adequate volume   Culture   Final    NO GROWTH < 12 HOURS Performed at [facility], [address] [place], [place] [address]    Report Status PENDING  Incomplete  Culture, blood (Routine X 2) w Reflex to ID Panel     Status: None (Preliminary result)   Collection Time: 09/12/19  9:27 PM   Specimen: BLOOD  Result Value Ref Range Status   Specimen Description BLOOD BLOOD RIGHT HAND  Final   Special Requests   Final    BOTTLES DRAWN AEROBIC AND ANAEROBIC Blood Culture adequate volume   Culture   Final    NO GROWTH < 12 HOURS Performed at [facility], [address], [place], [place] [address]    Report Status PENDING  Incomplete  MRSA PCR Screening     Status: None   Collection Time: 09/13/19 12:07 AM   Specimen: Nasopharyngeal  Result Value Ref Range Status   MRSA by PCR NEGATIVE NEGATIVE Final    Comment:        The GeneXpert MRSA Assay (FDA approved for NASAL specimens only), is one component of a comprehensive MRSA colonization surveillance program. It is not intended to diagnose  MRSA infection nor to guide or monitor treatment for MRSA infections. Performed at [facility], [address], [place], [place] [address]      Studies: CT Maxillofacial W Contrast  Result Date: 09/12/2019 CLINICAL DATA:  Dental abscess and leukocytosis. EXAM: CT MAXILLOFACIAL WITH CONTRAST TECHNIQUE: Multidetector CT imaging of the maxillofacial structures was performed with intravenous contrast. Multiplanar CT image reconstructions were also generated. CONTRAST:  40mL OMNIPAQUE IOHEXOL 300 MG/ML  SOLN COMPARISON:  None. FINDINGS: Osseous: No facial fracture. Dental: There is poor dentition. There are large periapical lucencies at the roots of the mandibular central and lateral incisors. There is mild adjacent soft tissue swelling. There is dehiscence of the anterior mandibular cortex at the mandibular symphysis. No subperiosteal abscess. Severe carious disease of all of the teeth. Orbits: The globes are intact.  Normal appearance of the intra- and extraconal fat. Symmetric extraocular muscles. Sinuses: No fluid levels or advanced mucosal thickening. Soft tissues: Normal visualized extracranial soft tissues. Limited intracranial: Normal. IMPRESSION: Severe dental disease with large periapical lucencies at the roots of teeth 23-26 and cortical dehiscence of the anterior aspect of the mandibular symphysis. Electronically Signed   By: Deatra Robinson M.D.   On: 09/12/2019 17:08   DG Chest Portable 1 View  Result Date: 09/12/2019 CLINICAL DATA:  Weakness with vomiting as well as coughing. Toothache. EXAM: PORTABLE CHEST 1 VIEW COMPARISON:  04/11/2019 FINDINGS: Lungs are adequately inflated without focal airspace consolidation or effusion. Cardiomediastinal silhouette, bones and soft tissues are normal. IMPRESSION: No active disease. Electronically Signed   By: Elberta Fortis M.D.   On: 09/12/2019 13:15    Scheduled Meds: . gabapentin  200 mg Oral QHS  . insulin aspart  0-5 Units Subcutaneous  QHS  . insulin aspart  0-9 Units Subcutaneous TID WC  . insulin glargine  25 Units Subcutaneous Daily  . ondansetron (ZOFRAN) IV  4 mg Intravenous Q6H  . pantoprazole (PROTONIX) IV  40 mg Intravenous Q12H   Continuous Infusions: . sodium chloride 75 mL/hr at 09/13/19 1446  . clindamycin (CLEOCIN) IV 600 mg (09/13/19 1027)  . pantoprazole (PROTONIX) IV      Assessment/Plan:  1. Clinical sepsis present on admission with dental abscesses, leukocytosis and tachycardia.  Patient has an allergy to penicillin and vancomycin.  Clindamycin ordered.  Will need to follow-up with [facility] dental clinic upon discharge.  Likely all his teeth roots will have to be removed.  So far blood cultures are negative. 2. Type 1 diabetes mellitus uncontrolled with hemoglobin A1c elevated at 9.3.  Patient had relative hypoglycemia today being n.p.o. for endoscopy.  Hopefully can restart diet after endoscopy. 3. Persistent nausea vomiting with hematemesis.  Could be cannabis hyperemesis syndrome because he does have cannabis in his urine toxicology.  Currently n.p.o. for possible endoscopy.  IV Protonix.  Zofran standing dose. 4. History of hepatitis C with elevated liver function test.  Previous CAT scan did not show any signs of cirrhosis 5. Covid negative  Code Status:     Code Status Orders  (From admission, onward)         Start     Ordered   09/12/19 1729  Full code  Continuous     09/12/19 1728        Code Status History    Date Active Date Inactive Code Status Order ID Comments User Context   10/11/2018 2247 10/15/2018 1843 Full Code [id number]  Oralia Manis, MD Inpatient   08/17/2018 1609 08/23/2018 0100 Full Code [id number]  Altamese Dilling, MD Inpatient   06/29/2017 1354 06/30/2017 1850 Full Code [id number]  Marguarite Arbour, MD Inpatient   06/05/2017 0456 06/07/2017 2052 Full Code [id number]  Arnaldo Natal, MD Inpatient   06/04/2017 0259 06/05/2017 0456 Full Code [id number]  Ihor Austin, MD  Inpatient   04/17/2017 1055 04/21/2017 1559 Full Code [id number]  Ramonita Lab, MD Inpatient   02/03/2017 1336 02/04/2017 2128 Full Code [id number]  Gracelyn Nurse, MD ED   10/13/2016 0526 10/15/2016 1812 Full Code [id number]  Arnaldo Natal, MD Inpatient   09/08/2016 0352 09/08/2016 1837 Full Code [id number]  Tonye Royalty, DO Inpatient   08/12/2016 2220 08/13/2016 1710 Full Code [id number]  Katharina Caper, MD Inpatient   07/04/2016 1224 07/05/2016 1936 Full Code [id number]  Altamese Dilling, MD Inpatient   07/04/2016 1224 07/04/2016  1224 Full Code [id number]  Vaughan Basta, MD Inpatient   04/09/2016 1654 04/11/2016 1409 Full Code [id number]  Theodoro Grist, MD Inpatient   03/26/2016 0118 03/27/2016 1911 Full Code [id number]  Harvie Bridge, DO Inpatient   03/18/2016 0815 03/21/2016 2035 Full Code [id number]  Lytle Butte, MD ED   03/12/2016 1220 03/16/2016 1927 Full Code [id number]  Fritzi Mandes, MD Inpatient   02/18/2016 1659 02/19/2016 1718 Full Code [id number]  Nicholes Mango, MD ED   01/12/2016 0858 01/15/2016 1933 Full Code [id number]  Hillary Bow, MD ED   01/11/2016 0035 01/11/2016 2320 Full Code [id number]  Quintella Baton, MD Inpatient   12/12/2015 2204 12/14/2015 1838 Full Code [id number]  Theodoro Grist, MD Inpatient   10/23/2015 1742 10/25/2015 1904 Full Code [id number]  Epifanio Lesches, MD ED   06/10/2015 0820 06/12/2015 1441 Full Code [id number]  Harrie Foreman, MD Inpatient   06/06/2015 2043 06/07/2015 1620 Full Code [id number]  Fritzi Mandes, MD Inpatient   04/07/2015 1245 04/08/2015 2037 Full Code [id number]  Vaughan Basta, MD Inpatient   12/09/2014 1444 12/13/2014 1616 Full Code [id number]  Aldean Jewett, MD Inpatient   Advance Care Planning Activity     Family Communication: Spoke with Carlis Abbott on the phone.  Disposition Plan: Likely will need a few days of IV antibiotics  Consultants:  Gastroenterology  Antibiotics:  Clindamycin  Time spent: 26 minutes  [place]

## 2019-09-13 NOTE — Anesthesia Preprocedure Evaluation (Deleted)
Anesthesia Evaluation  Patient identified by MRN, date of birth, ID band Patient awake    Reviewed: Allergy & Precautions, NPO status , Patient's Chart, lab work & pertinent test results  History of Anesthesia Complications Negative for: history of anesthetic complications  Airway        Dental   Pulmonary neg sleep apnea, neg COPD, Current Smoker,           Cardiovascular hypertension, Pt. on medications + Past MI       Neuro/Psych neg Seizures Depression    GI/Hepatic (+) Hepatitis -, C  Endo/Other  diabetes, Insulin Dependent  Renal/GU negative Renal ROS     Musculoskeletal   Abdominal   Peds  Hematology   Anesthesia Other Findings   Reproductive/Obstetrics                             Anesthesia Physical Anesthesia Plan Anesthesia Quick Evaluation

## 2019-09-13 NOTE — Consult Note (Signed)
Trevor Darby, MD [address]  [place]  [place], [place] [address]  Main: [phone]  Fax: [phone] Pager: [phone]   Consultation  Referring Provider:     No ref. provider found Primary Care Physician:  Langston Reusing, NP Primary Gastroenterologist: Althia Forts       Reason for Consultation: Hematemesis  Date of Admission:  09/12/2019 Date of Consultation:  09/13/2019         HPI:   Trevor Brown is a 29 y.o. male with history of polysubstance abuse, poorly controlled diabetes on insulin presented to ER yesterday secondary to episode of nausea, emesis as well as severe to take.  Patient noticed streaks of blood in his vomitus.  He had significant leukocytosis, CT maxillofacial with contrast revealed severe dental disease with large periapical lucencies at the roots of the teeth.  There is concern for dental infection.  Patient has severe swelling of his left lower jaw and unable to open his mouth wide. His hemoglobin was 14.1 on admission, 11.4 today with persistent leukocytosis.  Patient is currently being treated for dental infection, started on pantoprazole drip and GI is consulted for further evaluation.  Patient reports taking BC powder 2 times a day for last 1 week due to dental pain.  He is also taking NSAID for dental pain. He has history of hepatitis C, also has elevated transaminases AST greater than ALT NSAIDs: BC powder and Aleve for dental pain  Antiplts/Anticoagulants/Anti thrombotics: None  GI Procedures: None  Past Medical History:  Diagnosis Date  . Heart attack ([place])    Pt claims he had heart attack a year ago ( 2015)- and was admitted in [facility] for that, but not given any meds or angiogram, on review of chart- I could not find any details like that.  . Hepatitis C, acute may 2016  . Heroin abuse ([place])   . History of noncompliance with medical treatment   . Hypertension   . Type 1 diabetes [facility])     Past Surgical History:    Procedure Laterality Date  . NO PAST SURGERIES    . none      Prior to Admission medications   Medication Sig Start Date End Date Taking? Authorizing Provider  gabapentin (NEURONTIN) 100 MG capsule Take 2 capsules (200 mg total) by mouth at bedtime. 06/28/19  Yes Iloabachie, [name], NP  insulin aspart (NOVOLOG) 100 UNIT/ML injection Inject 5-10 Units into the skin See admin instructions. INJECT BEFORE MEALS PER SLIDING SCALE AS DIRECTED: 80-250 = 5 UNITS;251-350 = 8 UNITS; OVER 350 = 10 UNITS 08/10/19  Yes Gayland Curry, MD  Insulin Glargine (BASAGLAR KWIKPEN) 100 UNIT/ML SOPN Inject 0.25 mLs (25 Units total) into the skin daily. 08/12/19 09/12/19 Yes Iloabachie, [name], NP  glucose blood (FREESTYLE LITE) test strip To be used as directed for checking her blood sugar 06/28/19   Iloabachie, [name], NP    Current Facility-Administered Medications:  .  0.9 %  sodium chloride infusion, , Intravenous, Continuous, Wieting, Richard, MD, Last Rate: 75 mL/hr at 09/13/19 1727, New Bag at 09/13/19 1727 .  acetaminophen (TYLENOL) tablet 650 mg, 650 mg, Oral, Q6H PRN **OR** acetaminophen (TYLENOL) suppository 650 mg, 650 mg, Rectal, Q6H PRN, Wieting, Richard, MD .  clindamycin (CLEOCIN) IVPB 600 mg, 600 mg, Intravenous, Q8H, Wieting, Richard, MD, Last Rate: 100 mL/hr at 09/13/19 1729, 600 mg at 09/13/19 1729 .  gabapentin (NEURONTIN) capsule 200 mg, 200 mg, Oral, QHS, Wieting, Richard,  MD, 200 mg at 09/12/19 2302 .  insulin aspart (novoLOG) injection 0-5 Units, 0-5 Units, Subcutaneous, QHS, Loletha Grayer, MD, 2 Units at 09/12/19 2303 .  insulin aspart (novoLOG) injection 0-9 Units, 0-9 Units, Subcutaneous, TID WC, Loletha Grayer, MD, 1 Units at 09/13/19 1730 .  insulin glargine (LANTUS) injection 20 Units, 20 Units, Subcutaneous, Daily, Wieting, Richard, MD .  morphine 2 MG/ML injection 2 mg, 2 mg, Intravenous, Q4H PRN, Loletha Grayer, MD, 2 mg at 09/13/19 1437 .  [DISCONTINUED] ondansetron  (ZOFRAN) tablet 4 mg, 4 mg, Oral, Q6H PRN **OR** ondansetron (ZOFRAN) injection 4 mg, 4 mg, Intravenous, Q6H, Wieting, Richard, MD, 4 mg at 09/13/19 1725 .  pantoprazole (PROTONIX) 80 mg in sodium chloride 0.9 % 100 mL IVPB, 80 mg, Intravenous, Once, Wieting, Richard, MD .  pantoprazole (PROTONIX) injection 40 mg, 40 mg, Intravenous, Q12H, Loletha Grayer, MD, 40 mg at 09/13/19 9892 .  promethazine (PHENERGAN) injection 12.5 mg, 12.5 mg, Intravenous, Q6H PRN, Loletha Grayer, MD, 12.5 mg at 09/12/19 2219  Family History  Problem Relation Age of Onset  . Cirrhosis Mother   . Diabetes Mellitus II Maternal Grandmother   . Diabetes Mellitus II Maternal Grandfather   . CAD Father      Social History   Tobacco Use  . Smoking status: Current Every Day Smoker    Packs/day: 2.00    Types: Cigarettes  . Smokeless tobacco: Never Used  Substance Use Topics  . Alcohol use: No    Alcohol/week: 0.0 standard drinks  . Drug use: Not Currently    Types: IV, Heroin, Marijuana    Comment: heroin- last used October 04 2018    Allergies as of 09/12/2019 - Review Complete 09/12/2019  Allergen Reaction Noted  . Bee venom Anaphylaxis 12/09/2014  . Penicillins Anaphylaxis, Hives, and Other (See Comments) 12/09/2014  . Ibuprofen Itching, Rash, and Other (See Comments) 04/15/2014  . Tramadol Hives and Swelling 06/25/2014  . Vancomycin Rash and Other (See Comments) 06/05/2015    Review of Systems:    All systems reviewed and negative except where noted in HPI.   Physical Exam:  Vital signs in last 24 hours: Temp:  [97.4 F (36.3 C)-99.7 F (37.6 C)] 97.4 F (36.3 C) (02/22 1557) Pulse Rate:  [79-124] 79 (02/22 1557) Resp:  [16-20] 16 (02/22 1557) BP: (121-137)/(65-79) 135/70 (02/22 1009) SpO2:  [100 %] 100 % (02/22 1557) Weight:  [54.4 kg] 54.4 kg (02/22 1557) Last BM Date: 09/12/19 General:   Ill-appearing, not in distress Head:  Normocephalic and atraumatic. Eyes:   No icterus.    Conjunctiva pink. PERRLA. Ears:  Normal auditory acuity. Neck: Swelling of the left lower jaw Lungs: Respirations even and unlabored. Lungs clear to auscultation bilaterally.   No wheezes, crackles, or rhonchi.  Heart:  Regular rate and rhythm;  Without murmur, clicks, rubs or gallops Abdomen:  Soft, nondistended, nontender. Normal bowel sounds. No appreciable masses or hepatomegaly.  No rebound or guarding.  Rectal:  Not performed. Msk:  Symmetrical without gross deformities.  Strength generalized weakness Extremities:  Without edema, cyanosis or clubbing. Neurologic:  Alert and oriented x3;  grossly normal neurologically. Skin:  Intact without significant lesions or rashes. Psych:  Alert and cooperative. Normal affect.  LAB RESULTS: CBC Latest Ref Rng & Units 09/13/2019 09/12/2019 06/08/2019  WBC 4.0 - 10.5 K/uL 19.4(H) 19.8(H) 15.1(H)  Hemoglobin 13.0 - 17.0 g/dL 11.4(L) 14.4 14.1  Hematocrit 39.0 - 52.0 % 34.0(L) 42.0 44.0  Platelets 150 - 400 K/uL 226  249 295    BMET BMP Latest Ref Rng & Units 09/13/2019 09/12/2019 07/08/2019  Glucose 70 - 99 mg/dL 192(H) 360(H) 403(H)  BUN 6 - 20 mg/dL 24(H) 27(H) 29(H)  Creatinine 0.61 - 1.24 mg/dL 0.99 0.93 1.78(H)  BUN/Creat Ratio 9 - 20 - - 16  Sodium 135 - 145 mmol/L 140 137 131(L)  Potassium 3.5 - 5.1 mmol/L 4.0 5.0 4.8  Chloride 98 - 111 mmol/L 110 98 92(L)  CO2 22 - 32 mmol/L 18(L) 22 17(L)  Calcium 8.9 - 10.3 mg/dL 8.5(L) 9.9 9.5    LFT Hepatic Function Latest Ref Rng & Units 09/12/2019 07/08/2019 06/08/2019  Total Protein 6.5 - 8.1 g/dL 7.9 7.8 8.8(H)  Albumin 3.5 - 5.0 g/dL 4.8 4.7 5.1(H)  AST 15 - 41 U/L 114(H) 65(H) 69(H)  ALT 0 - 44 U/L 78(H) 54(H) 58(H)  Alk Phosphatase 38 - 126 U/L 124 161(H) 111  Total Bilirubin 0.3 - 1.2 mg/dL 1.4(H) 0.5 0.7  Bilirubin, Direct 0.1 - 0.5 mg/dL - - -     STUDIES: CT Maxillofacial W Contrast  Result Date: 09/12/2019 CLINICAL DATA:  Dental abscess and leukocytosis. EXAM: CT  MAXILLOFACIAL WITH CONTRAST TECHNIQUE: Multidetector CT imaging of the maxillofacial structures was performed with intravenous contrast. Multiplanar CT image reconstructions were also generated. CONTRAST:  62m OMNIPAQUE IOHEXOL 300 MG/ML  SOLN COMPARISON:  None. FINDINGS: Osseous: No facial fracture. Dental: There is poor dentition. There are large periapical lucencies at the roots of the mandibular central and lateral incisors. There is mild adjacent soft tissue swelling. There is dehiscence of the anterior mandibular cortex at the mandibular symphysis. No subperiosteal abscess. Severe carious disease of all of the teeth. Orbits: The globes are intact. Normal appearance of the intra- and extraconal fat. Symmetric extraocular muscles. Sinuses: No fluid levels or advanced mucosal thickening. Soft tissues: Normal visualized extracranial soft tissues. Limited intracranial: Normal. IMPRESSION: Severe dental disease with large periapical lucencies at the roots of teeth 23-26 and cortical dehiscence of the anterior aspect of the mandibular symphysis. Electronically Signed   By: KUlyses JarredM.D.   On: 09/12/2019 17:08   DG Chest Portable 1 View  Result Date: 09/12/2019 CLINICAL DATA:  Weakness with vomiting as well as coughing. Toothache. EXAM: PORTABLE CHEST 1 VIEW COMPARISON:  04/11/2019 FINDINGS: Lungs are adequately inflated without focal airspace consolidation or effusion. Cardiomediastinal silhouette, bones and soft tissues are normal. IMPRESSION: No active disease. Electronically Signed   By: DMarin OlpM.D.   On: 09/12/2019 13:15      Impression / Plan:   TKIRTIS CHALLISis a 29y.o. male with poorly controlled type 1 diabetes, polysubstance abuse, chronic hep C is admitted with dental pain, nausea, vomiting and leukocytosis.  Nausea and emesis are secondary to dental infection.  Patient reports streaks of blood in the emesis.  This appears to be clinically insignificant.  He does have history of  NSAID use.  Therefore, peptic ulcer disease is certainly of concern  ? Hematemesis Unable to perform upper endoscopy due to severe dental pain and swelling and possible underlying abscess.  Patient is unable to open the mouth wide, he is high risk for anesthesia Continue pantoprazole drip, recommend long-term Protonix 40 mg twice daily Diet as tolerated Monitor CBC daily  Leukocytosis secondary to dental infection Blood cultures are pending to evaluate for infective endocarditis  Elevated LFTs, likely has chronic hep C + alcohol use HCV antibody positive Check HCV viral load Abstinence from alcohol use  Thank  you for involving me in the care of this patient.      LOS: 1 day   Sherri Sear, MD  09/13/2019, 6:09 PM   Note: This dictation was prepared with Dragon dictation along with smaller phrase technology. Any transcriptional errors that result from this process are unintentional.

## 2019-09-13 NOTE — Progress Notes (Signed)
Received pt from unit for gi procedure. During assessment BS checked was 50. Pt asymptomatic. Reported to anesthesiologist  administered D5 1/2 NS per Md order. Reported to unit nurse and charge nurse.  Pt sent back to unit with no complaints.

## 2019-09-14 DIAGNOSIS — E876 Hypokalemia: Secondary | ICD-10-CM

## 2019-09-14 LAB — CBC
HCT: 32.4 % — ABNORMAL LOW (ref 39.0–52.0)
Hemoglobin: 10.9 g/dL — ABNORMAL LOW (ref 13.0–17.0)
MCH: 29.3 pg (ref 26.0–34.0)
MCHC: 33.6 g/dL (ref 30.0–36.0)
MCV: 87.1 fL (ref 80.0–100.0)
Platelets: 203 10*3/uL (ref 150–400)
RBC: 3.72 MIL/uL — ABNORMAL LOW (ref 4.22–5.81)
RDW: 13.8 % (ref 11.5–15.5)
WBC: 8.5 10*3/uL (ref 4.0–10.5)
nRBC: 0 % (ref 0.0–0.2)

## 2019-09-14 LAB — BASIC METABOLIC PANEL
Anion gap: 6 (ref 5–15)
BUN: 14 mg/dL (ref 6–20)
CO2: 30 mmol/L (ref 22–32)
Calcium: 8.5 mg/dL — ABNORMAL LOW (ref 8.9–10.3)
Chloride: 106 mmol/L (ref 98–111)
Creatinine, Ser: 0.71 mg/dL (ref 0.61–1.24)
GFR calc Af Amer: >60 mL/min (ref >60)
GFR calc non Af Amer: >60 mL/min (ref >60)
Glucose, Bld: 50 mg/dL — ABNORMAL LOW (ref 70–99)
Potassium: 3.1 mmol/L — ABNORMAL LOW (ref 3.5–5.1)
Sodium: 142 mmol/L (ref 135–145)

## 2019-09-14 LAB — GLUCOSE, CAPILLARY
Glucose-Capillary: 64 mg/dL — ABNORMAL LOW (ref 70–99)
Glucose-Capillary: 177 mg/dL — ABNORMAL HIGH (ref 70–99)
Glucose-Capillary: 165 mg/dL — ABNORMAL HIGH (ref 70–99)

## 2019-09-14 LAB — ETHANOL: Alcohol, Ethyl (B): <10 mg/dL (ref <10)

## 2019-09-14 MED ORDER — PANTOPRAZOLE SODIUM 40 MG PO TBEC: 40.0000 mg | DELAYED_RELEASE_TABLET | Freq: Every day | ORAL | 0 refills | Status: DC

## 2019-09-14 MED ORDER — PHENYLEPHRINE HCL (PRESSORS) 10 MG/ML IV SOLN
INTRAVENOUS | Status: AC
  Filled 2019-09-14: qty 1

## 2019-09-14 MED ORDER — LIDOCAINE HCL (PF) 2 % IJ SOLN
INTRAMUSCULAR | Status: AC
  Filled 2019-09-14: qty 30

## 2019-09-14 MED ORDER — NICOTINE 21 MG/24HR TD PT24
21.0000 mg | MEDICATED_PATCH | Freq: Every day | TRANSDERMAL | Status: DC
  Administered 2019-09-14: 21 mg via TRANSDERMAL
  Filled 2019-09-14: qty 1

## 2019-09-14 MED ORDER — POTASSIUM CHLORIDE IN NACL 20-0.9 MEQ/L-% IV SOLN
INTRAVENOUS | Status: DC
  Filled 2019-09-14: qty 1000

## 2019-09-14 MED ORDER — OXYCODONE HCL 5 MG PO TABS: 5.0000 mg | ORAL_TABLET | Freq: Three times a day (TID) | ORAL | 0 refills | Status: DC | PRN

## 2019-09-14 MED ORDER — LIDOCAINE HCL (PF) 2 % IJ SOLN
INTRAMUSCULAR | Status: AC
  Filled 2019-09-14: qty 10

## 2019-09-14 MED ORDER — GLYCOPYRROLATE 0.2 MG/ML IJ SOLN
INTRAMUSCULAR | Status: AC
  Filled 2019-09-14: qty 3

## 2019-09-14 MED ORDER — NICOTINE 21 MG/24HR TD PT24: MEDICATED_PATCH | TRANSDERMAL | 0 refills | Status: DC

## 2019-09-14 MED ORDER — POTASSIUM CHLORIDE CRYS ER 20 MEQ PO TBCR
40.0000 meq | EXTENDED_RELEASE_TABLET | Freq: Once | ORAL | Status: AC
  Administered 2019-09-14: 40 meq via ORAL
  Filled 2019-09-14: qty 2

## 2019-09-14 MED ORDER — PROPOFOL 500 MG/50ML IV EMUL
INTRAVENOUS | Status: AC
  Filled 2019-09-14: qty 250

## 2019-09-14 MED ORDER — CLINDAMYCIN HCL 300 MG PO CAPS: 300.0000 mg | ORAL_CAPSULE | Freq: Three times a day (TID) | ORAL | 0 refills | Status: AC

## 2019-09-14 MED ORDER — EPHEDRINE SULFATE 50 MG/ML IJ SOLN
INTRAMUSCULAR | Status: AC
  Filled 2019-09-14: qty 1

## 2019-09-14 MED ORDER — PROPOFOL 10 MG/ML IV BOLUS
INTRAVENOUS | Status: AC
  Filled 2019-09-14: qty 60

## 2019-09-14 NOTE — Discharge Instructions (Signed)
Dental Abscess  A dental abscess is a collection of pus in or around a tooth that results from an infection. An abscess can cause pain in the affected area as well as other symptoms. Treatment is important to help with symptoms and to prevent the infection from spreading. What are the causes? This condition is caused by a bacterial infection around the root of the tooth that involves the inner part of the tooth (pulp). It may result from:  Severe tooth decay.  Trauma to the tooth, such as a broken or chipped tooth, that allows bacteria to enter into the pulp.  Severe gum disease around a tooth. What increases the risk? This condition is more likely to develop in males. It is also more likely to develop in people who:  Have dental decay (cavities).  Eat sugary snacks between meals.  Use tobacco products.  Have diabetes.  Have a weakened disease-fighting system (immune system).  Do not brush and care for their teeth regularly. What are the signs or symptoms? Symptoms of this condition include:  Severe pain in and around the infected tooth.  Swelling and redness around the infected tooth, in the mouth, or in the face.  Tenderness.  Pus drainage.  Bad breath.  Bitter taste in the mouth.  Difficulty swallowing.  Difficulty opening the mouth.  Nausea.  Vomiting.  Chills.  Swollen neck glands.  Fever. How is this diagnosed? This condition is diagnosed based on:  Your symptoms and your medical and dental history.  An examination of the infected tooth. During the exam, your dentist may tap on the infected tooth. You may also have X-rays of the affected area. How is this treated? This condition is treated by getting rid of the infection. This may be done with:  Incision and drainage. This procedure is done by making an incision in the abscess to drain out the pus. Removing pus is the first priority in treating an abscess.  Antibiotic medicines. These may be used  in certain situations.  Antibacterial mouth rinse.  A root canal. This may be performed to save the tooth. Your dentist accesses the visible part of your tooth (crown) with a drill and removes any damaged pulp. Then the space is filled and sealed off.  Tooth extraction. The tooth is pulled out if it cannot be saved by other treatment. You may also receive treatment for pain, such as:  Acetaminophen or NSAIDs.  Gels that contain a numbing medicine.  An injection to block the pain near your nerve. Follow these instructions at home: Medicines  Take over-the-counter and prescription medicines only as told by your dentist.  If you were prescribed an antibiotic, take it as told by your dentist. Do not stop taking the antibiotic even if you start to feel better.  If you were prescribed a gel that contains a numbing medicine, use it exactly as told in the directions. Do not use these gels for children who are younger than 2 years of age.  Do not drive or use heavy machinery while taking prescription pain medicine. General instructions  Rinse out your mouth often with salt water to relieve pain or swelling. To make a salt-water mixture, completely dissolve -1 tsp of salt in 1 cup of warm water.  Eat a soft diet while your abscess is healing.  Drink enough fluid to keep your urine pale yellow.  Do not apply heat to the outside of your mouth.  Do not use any products that contain nicotine or   tobacco, such as cigarettes and e-cigarettes. If you need help quitting, ask your health care provider.  Keep all follow-up visits as told by your dentist. This is important. How is this prevented?  Brush your teeth every morning and night with fluoride toothpaste. Floss one time each day.  Get regularly scheduled dental cleanings.  Consider having a dental sealant applied on teeth that have deep holes (caries).  Drink fluoridated water regularly. This includes most tap water. Check the label  on bottled water to see if it contains fluoride.  Drink water instead of sugary drinks.  Eat healthy meals and snacks.  Wear a mouth guard or face shield to protect your teeth while playing sports. Contact a health care provider if:  Your pain is worse and is not helped by medicine. Get help right away if:  You have a fever or chills.  Your symptoms suddenly get worse.  You have a very bad headache.  You have problems breathing or swallowing.  You have trouble opening your mouth.  You have swelling in your neck or around your eye. Summary  A dental abscess is a collection of pus in or around a tooth that results from an infection.  A dental abscess may result from severe tooth decay, trauma to the tooth, or severe gum disease around a tooth.  Symptoms include severe pain, swelling, redness, and drainage of pus in and around the infected tooth.  The first priority in treating a dental abscess is to drain out the pus. Treatment may also involve removing damage inside the tooth (root canal) or pulling out (extracting) the tooth. This information is not intended to replace advice given to you by your health care provider. Make sure you discuss any questions you have with your health care provider. Document Revised: 06/20/2017 Document Reviewed: 03/10/2017 Elsevier Patient Education  2020 [employer]  

## 2019-09-14 NOTE — Progress Notes (Signed)
Pt given apple juice and icee for blood glucose of 50, currently 64. Pt assymptomatic, day shift rn made aware of pt current condition.

## 2019-09-14 NOTE — TOC Initial Note (Signed)
Transition of Care [facility]) - Initial/Assessment Note    Patient Details  Name: Trevor Brown MRN: [id number] Date of Birth: 24-Nov-1990  [facility]) CM/SW Contact:    Beverly Sessions, RN Phone Number: 09/14/2019, 1:58 PM  Clinical Narrative:                 Patient admitted with dental abscess Patient states that he lives at home with his girlfriend.  Girlfriend at bedside.  States they use [employer] bus for transportation, and that's how they will be getting home today  Patient is followed at the [facility] and is current with PCP.  Denies issues obtaining medications.  Confirms he has a working glucometer at home.  States "I think I may be out of strips".  RNCM attempted to provide patient with a bo of strips however they will not work with his meter.  Patient states that he receives his diabetic testing supplies from the [facility]  For free, and will stop by on the way home to obtain more strips.   Patient will need follow up at dental clinic.  Provided patient with information on [facility] dental clinic   Expected Discharge Plan: Home/Self Care Barriers to Discharge: No Barriers Identified   Patient Goals and CMS Choice        Expected Discharge Plan and Services Expected Discharge Plan: Home/Self Care   Discharge Planning Services: CM Consult     Expected Discharge Date: 09/14/19                                    Prior Living Arrangements/Services   Lives with:: Significant Other Patient language and need for interpreter reviewed:: Yes Do you feel safe going back to the place where you live?: Yes      Need for Family Participation in Patient Care: No (Comment) Care giver support system in place?: Yes (comment) Current home services: DME Criminal Activity/Legal Involvement Pertinent to Current Situation/Hospitalization: No - Comment as needed  Activities of Daily Living Home Assistive Devices/Equipment: None ADL Screening  (condition at time of admission) Patient's cognitive ability adequate to safely complete daily activities?: Yes Is the patient deaf or have difficulty hearing?: No Does the patient have difficulty seeing, even when wearing glasses/contacts?: No Does the patient have difficulty concentrating, remembering, or making decisions?: No Patient able to express need for assistance with ADLs?: Yes Does the patient have difficulty dressing or bathing?: No Independently performs ADLs?: Yes (appropriate for developmental age) Does the patient have difficulty walking or climbing stairs?: No Weakness of Legs: None Weakness of Arms/Hands: None  Permission Sought/Granted                  Emotional Assessment       Orientation: : Oriented to Self, Oriented to Place, Oriented to Situation, Oriented to  Time Alcohol / Substance Use: Illicit Drugs Psych Involvement: No (comment)  Admission diagnosis:  Dental abscess [K04.7] Acute upper GI bleed [K92.2] Diabetic ketoacidosis without coma associated with type 1 diabetes mellitus ([place]) [E10.10] Hematemesis with nausea [K92.0] Patient Active Problem List   Diagnosis Date Noted  . Hypokalemia   . Sepsis without acute organ dysfunction ([place])   . Dental abscess 09/12/2019  . Intractable vomiting   . Hematemesis with nausea   . Cannabis hyperemesis syndrome concurrent with and due to cannabis abuse ([place])   . Elevated LFTs   .  Abnormal laboratory test result 07/20/2019  . Smoking 07/20/2019  . History of hepatitis C 02/25/2019  . Health care maintenance 02/25/2019  . Peripheral neuropathy 02/25/2019  . Hand abscess 10/13/2018  . IVDU (intravenous drug user) 10/11/2018  . Hyperkalemia 10/11/2018  . Cellulitis and abscess of hand 08/17/2018  . Abdominal pain 06/29/2017  . Hyponatremia 06/29/2017  . DKA (diabetic ketoacidosis) (HCC) 06/29/2017  . Dental erosion extending into pulp 12/31/2016  . 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 Elevated transaminase  level 03/27/2016  . Diabetes mellitus type 1 (HCC) 03/27/2016  . Tobacco abuse counseling 03/27/2016  . Transaminitis 03/25/2016  . Leukocytosis 01/13/2016  . Cannabinoid hyperemesis syndrome 01/10/2016  . Tobacco abuse 01/10/2016  . Type 1 diabetes mellitus with hyperglycemia (HCC) 12/12/2015  . Cocaine abuse (HCC) 12/12/2015  . DKA (diabetic ketoacidoses) (HCC) 06/10/2015  . Hidradenitis suppurativa of left axilla   . DKA, type 1 (HCC) 06/06/2015  . Malnutrition of moderate degree (HCC) 04/08/2015  . Hepatitis C 12/13/2014  . Diabetes type 1, uncontrolled (HCC) 12/11/2014  . Major depressive disorder, single episode, mild (HCC)   . Major depression, single episode 12/10/2014  . Diabetes mellitus type 1, uncontrolled (HCC) 12/09/2014  . Homelessness 12/09/2014   PCP:  Rolm Gala, NP Pharmacy:   [facility] [address] [place] [address] Phone: [phone] Fax: [phone]  [facility] [place], [place] - [address] [place] [address] Phone: [phone] Fax: [phone]  [facility] [address] ([address]), [place] - [address] [place]) [place] [address] Phone: [phone] Fax: [phone]     Social Determinants of Health (SDOH) Interventions    Readmission Risk Interventions Readmission Risk Prevention Plan 10/15/2018  Transportation Screening Complete  PCP or Specialist Appt within 3-5 Days Complete  Medication Review (RN Care Manager) Complete  Some recent data might be hidden

## 2019-09-14 NOTE — Progress Notes (Signed)
Trevor Brown to be D/C'd home with girlfriend per MD order.  Discussed prescriptions and follow up appointments with the patient. Prescriptions given to patient, medication list explained in detail. Pt verbalized understanding.  Allergies as of 09/14/2019       Reactions   Bee Venom Anaphylaxis   Penicillins Anaphylaxis, Hives, Other (See Comments)   Has patient had a PCN reaction causing immediate rash, facial/tongue/throat swelling, SOB or lightheadedness with hypotension: Yes Has patient had a PCN reaction causing severe rash involving mucus membranes or skin necrosis: No Has patient had a PCN reaction that required hospitalization No Has patient had a PCN reaction occurring within the last 10 years: Yes If all of the above answers are "NO", then may proceed with Cephalosporin use.   Ibuprofen Itching, Rash, Other (See Comments)   Other reaction(s): Other (See Comments) Stomach upset Reaction:  GI upset    Tramadol Hives, Swelling   Vancomycin Rash, Other (See Comments)   Reaction:  Red man's syndrome         Medication List     TAKE these medications    Basaglar KwikPen 100 UNIT/ML Sopn Inject 0.25 mLs (25 Units total) into the skin daily.   clindamycin 300 MG capsule Commonly known as: CLEOCIN Take 1 capsule (300 mg total) by mouth 3 (three) times daily for 7 days.   FREESTYLE LITE test strip Generic drug: glucose blood To be used as directed for checking her blood sugar   gabapentin 100 MG capsule Commonly known as: NEURONTIN Take 2 capsules (200 mg total) by mouth at bedtime.   insulin aspart 100 UNIT/ML injection Commonly known as: NovoLOG Inject 5-10 Units into the skin See admin instructions. INJECT BEFORE MEALS PER SLIDING SCALE AS DIRECTED: 80-250 = 5 UNITS;251-350 = 8 UNITS; OVER 350 = 10 UNITS   nicotine 21 mg/24hr patch Commonly known as: NICODERM CQ - dosed in mg/24 hours 21mg  patch chest wall daily (okay to substitute generic)   oxyCODONE 5 MG  immediate release tablet Commonly known as: Roxicodone Take 1 tablet (5 mg total) by mouth every 8 (eight) hours as needed for severe pain.   pantoprazole 40 MG tablet Commonly known as: Protonix Take 1 tablet (40 mg total) by mouth daily.        Vitals:   09/14/19 0421 09/14/19 1140  BP: 132/72 130/81  Pulse: 61 (!) 56  Resp: 18 18  Temp: (!) 97.5 F (36.4 C) 98.6 F (37 C)  SpO2: 99% 99%    Skin clean, dry and intact without evidence of skin break down, no evidence of skin tears noted. IV catheter discontinued intact. Site without signs and symptoms of complications. Dressing and pressure applied. Pt denies pain at this time. No complaints noted.  An After Visit Summary was printed and given to the patient. Patient escorted via WC, and D/C home via private auto.  Yamira Papa A Rosslyn Pasion

## 2019-09-14 NOTE — Discharge Summary (Signed)
[facility] [place] [facility]   PATIENT NAME: Trevor Brown    MR#:  [id number]  DATE OF BIRTH:  23-Jul-1990  DATE OF ADMISSION:  09/12/2019 ADMITTING PHYSICIAN: Alford Highland, MD  DATE OF DISCHARGE: 09/14/2019  PRIMARY CARE PHYSICIAN: Rolm Gala, NP    ADMISSION DIAGNOSIS:  Dental abscess [K04.7] Acute upper GI bleed [K92.2] Diabetic ketoacidosis without coma associated with type 1 diabetes mellitus (HCC) [E10.10] Hematemesis with nausea [K92.0]  DISCHARGE DIAGNOSIS:  Active Problems:   Cannabinoid hyperemesis syndrome   Leukocytosis   Dental abscess   Sepsis without acute organ dysfunction (HCC)   SECONDARY DIAGNOSIS:   Past Medical History:  Diagnosis Date  . Heart attack (HCC)    Pt claims he had heart attack a year ago ( 2015)- and was admitted in [facility] for that, but not given any meds or angiogram, on review of chart- I could not find any details like that.  . Hepatitis C, acute may 2016  . Heroin abuse (HCC)   . History of noncompliance with medical treatment   . Hypertension   . Type 1 diabetes (HCC)     HOSPITAL COURSE:   1.  Clinical sepsis present on admission with dental abscesses, leukocytosis and tachycardia.  Unfortunately the patient has an allergy to penicillin and vancomycin and we were stuck using clindamycin for this.  We gave IV clindamycin here and I will give clindamycin upon going home for another 7 days.  Blood cultures are negative.  Patient will have to follow-up with the dentist because all of his teeth roots will need to be removed.  Only a few pain pills prescribed into [facility]. 2.  Type 1 diabetes mellitus uncontrolled with hemoglobin A1c elevated at 9.3.  Patient has had relative hypoglycemia here being n.p.o. and on liquid diet.  I have been giving his insulin at 6 PM.  If he is eating he can go back on his 25 units of long-acting insulin and if he is not eating can use 20. 3.  Persistent nausea vomiting with  hematemesis.  Could be cannabis hyperemesis syndrome because he did have cannabis in his urine toxicology.  He advised to stop using marijuana.  IV Protonix switched over to oral Protonix.  As needed Zofran upon going home.  Gastroenterology was consulted for possible endoscopy but this was canceled because of difficulty opening his mouth. 4.  Hepatitis C with elevated liver function test.  In reviewing previous CAT scans no signs of cirrhosis were seen.  [facility]   5.  Patient was Covid negative 6.  Hypokalemia replace potassium orally.  Restarting diet should also help.  DISCHARGE CONDITIONS:   Satisfactory  CONSULTS OBTAINED:  Treatment Team:  Toney Reil, MD  DRUG ALLERGIES:   Allergies  Allergen Reactions  . Bee Venom Anaphylaxis  . Penicillins Anaphylaxis, Hives and Other (See Comments)    Has patient had a PCN reaction causing immediate rash, facial/tongue/throat swelling, SOB or lightheadedness with hypotension: Yes Has patient had a PCN reaction causing severe rash involving mucus membranes or skin necrosis: No Has patient had a PCN reaction that required hospitalization No Has patient had a PCN reaction occurring within the last 10 years: Yes If all of the above answers are "NO", then may proceed with Cephalosporin use.  . Ibuprofen Itching, Rash and Other (See Comments)    Other reaction(s): Other (See Comments) Stomach upset Reaction:  GI upset   . Tramadol Hives and  Swelling  . Vancomycin Rash and Other (See Comments)    Reaction:  Red man's syndrome     DISCHARGE MEDICATIONS:   Allergies as of 09/14/2019      Reactions   Bee Venom Anaphylaxis   Penicillins Anaphylaxis, Hives, Other (See Comments)   Has patient had a PCN reaction causing immediate rash, facial/tongue/throat swelling, SOB or lightheadedness with hypotension: Yes Has patient had a PCN reaction causing severe rash involving mucus membranes or skin necrosis:  No Has patient had a PCN reaction that required hospitalization No Has patient had a PCN reaction occurring within the last 10 years: Yes If all of the above answers are "NO", then may proceed with Cephalosporin use.   Ibuprofen Itching, Rash, Other (See Comments)   Other reaction(s): Other (See Comments) Stomach upset Reaction:  GI upset    Tramadol Hives, Swelling   Vancomycin Rash, Other (See Comments)   Reaction:  Red man's syndrome       Medication List    TAKE these medications   Basaglar KwikPen 100 UNIT/ML Sopn Inject 0.25 mLs (25 Units total) into the skin daily.   clindamycin 300 MG capsule Commonly known as: CLEOCIN Take 1 capsule (300 mg total) by mouth 3 (three) times daily for 7 days.   FREESTYLE LITE test strip Generic drug: glucose blood To be used as directed for checking her blood sugar   gabapentin 100 MG capsule Commonly known as: NEURONTIN Take 2 capsules (200 mg total) by mouth at bedtime.   insulin aspart 100 UNIT/ML injection Commonly known as: NovoLOG Inject 5-10 Units into the skin See admin instructions. INJECT BEFORE MEALS PER SLIDING SCALE AS DIRECTED: 80-250 = 5 UNITS;251-350 = 8 UNITS; OVER 350 = 10 UNITS   nicotine 21 mg/24hr patch Commonly known as: NICODERM CQ - dosed in mg/24 hours 21mg  patch chest wall daily (okay to substitute generic)   oxyCODONE 5 MG immediate release tablet Commonly known as: Roxicodone Take 1 tablet (5 mg total) by mouth every 8 (eight) hours as needed for severe pain.   pantoprazole 40 MG tablet Commonly known as: Protonix Take 1 tablet (40 mg total) by mouth daily.        DISCHARGE INSTRUCTIONS:   Follow-up PMD as scheduled by hospital team  If you experience worsening of your admission symptoms, develop shortness of breath, life threatening emergency, suicidal or homicidal thoughts you must seek medical attention immediately by calling 911 or calling your MD immediately  if symptoms less severe.  You  Must read complete instructions/literature along with all the possible adverse reactions/side effects for all the Medicines you take and that have been prescribed to you. Take any new Medicines after you have completely understood and accept all the possible adverse reactions/side effects.   Please note  You were cared for by a hospitalist during your hospital stay. If you have any questions about your discharge medications or the care you received while you were in the hospital after you are discharged, you can call the unit and asked to speak with the hospitalist on call if the hospitalist that took care of you is not available. Once you are discharged, your primary care physician will handle any further medical issues. Please note that NO REFILLS for any discharge medications will be authorized once you are discharged, as it is imperative that you return to your primary care physician (or establish a relationship with a primary care physician if you do not have one) for your aftercare needs so  that they can reassess your need for medications and monitor your lab values.    Today   CHIEF COMPLAINT:   Chief Complaint  Patient presents with  . Hematemesis  . Dental Pain    HISTORY OF PRESENT ILLNESS:  Hairo Garraway  is a 29 y.o. male came in with mouth pain and hematemesis   VITAL SIGNS:  Blood pressure 130/81, pulse (!) 56, temperature 98.6 F (37 C), temperature source Oral, resp. rate 18, height 5\' 4"  (1.626 m), weight 54.4 kg, SpO2 99 %.  I/O:    Intake/Output Summary (Last 24 hours) at 09/14/2019 1347 Last data filed at 09/14/2019 1336 Gross per 24 hour  Intake 2136.6 ml  Output 1875 ml  Net 261.6 ml    PHYSICAL EXAMINATION:  GENERAL:  29 y.o.-year-old patient lying in the bed with no acute distress.  EYES: Pupils equal, round, reactive to light and accommodation.  HEENT: Head atraumatic, normocephalic. Oropharynx and nasopharynx clear.  Poor dentition.  Less tenderness over  teeth roots. NECK:  Supple, no jugular venous distention. No thyroid enlargement, no tenderness.  LUNGS: Normal breath sounds bilaterally, no wheezing, rales,rhonchi or crepitation. No use of accessory muscles of respiration.  CARDIOVASCULAR: S1, S2 normal. No murmurs, rubs, or gallops.  ABDOMEN: Soft, non-tender, non-distended. EXTREMITIES: No pedal edema  NEUROLOGIC: Cranial nerves II through XII are intact. PSYCHIATRIC: The patient is alert and oriented x 3.  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  DATA REVIEW:   CBC Recent Labs  Lab 09/14/19 0315  WBC 8.5  HGB 10.9*  HCT 32.4*  PLT 203    Chemistries  Recent Labs  Lab 09/12/19 1212 09/13/19 0358 09/14/19 0315  NA 137   < > 142  K 5.0   < > 3.1*  CL 98   < > 106  CO2 22   < > 30  GLUCOSE 360*   < > 50*  BUN 27*   < > 14  CREATININE 0.93   < > 0.71  CALCIUM 9.9   < > 8.5*  AST 114*  --   --   ALT 78*  --   --   ALKPHOS 124  --   --   BILITOT 1.4*  --   --    < > = values in this interval not displayed.     Microbiology Results  Results for orders placed or performed during the hospital encounter of 09/12/19  SARS CORONAVIRUS 2 (TAT 6-24 HRS) Nasopharyngeal Nasopharyngeal Swab     Status: None   Collection Time: 09/12/19  8:24 PM   Specimen: Nasopharyngeal Swab  Result Value Ref Range Status   SARS Coronavirus 2 NEGATIVE NEGATIVE Final    Comment: (NOTE) SARS-CoV-2 target nucleic acids are NOT DETECTED. The SARS-CoV-2 RNA is generally detectable in upper and lower respiratory specimens during the acute phase of infection. Negative results do not preclude SARS-CoV-2 infection, do not rule out co-infections with other pathogens, and should not be used as the sole basis for treatment or other patient management decisions. Negative results must be combined with clinical observations, patient history, and epidemiological information. The expected result is  Negative. Fact Sheet for Patients: 09/14/19 Fact Sheet for Healthcare Providers: HairSlick.no This test is not yet approved or cleared by the quierodirigir.com FDA and  has been authorized for detection and/or diagnosis of SARS-CoV-2 by FDA under an Emergency Use Authorization (EUA). This EUA will remain  in effect (  meaning this test can be used) for the duration of the COVID-19 declaration under Section 56 4(b)(1) of the Act, 21 U.S.C. section 360bbb-3(b)(1), unless the authorization is terminated or revoked sooner. Performed at [facility], [address], [place], [place] [address]   Culture, blood (Routine X 2) w Reflex to ID Panel     Status: None (Preliminary result)   Collection Time: 09/12/19  9:26 PM   Specimen: BLOOD  Result Value Ref Range Status   Specimen Description BLOOD RIGHT ANTECUBITAL  Final   Special Requests   Final    BOTTLES DRAWN AEROBIC AND ANAEROBIC Blood Culture adequate volume   Culture   Final    NO GROWTH 2 DAYS Performed at [facility], [address] [place], [place] [address]    Report Status PENDING  Incomplete  Culture, blood (Routine X 2) w Reflex to ID Panel     Status: None (Preliminary result)   Collection Time: 09/12/19  9:27 PM   Specimen: BLOOD  Result Value Ref Range Status   Specimen Description BLOOD BLOOD RIGHT HAND  Final   Special Requests   Final    BOTTLES DRAWN AEROBIC AND ANAEROBIC Blood Culture adequate volume   Culture   Final    NO GROWTH 2 DAYS Performed at [facility], [address], [place], [place] [address]    Report Status PENDING  Incomplete  MRSA PCR Screening     Status: None   Collection Time: 09/13/19 12:07 AM   Specimen: Nasopharyngeal  Result Value Ref Range Status   MRSA by PCR NEGATIVE NEGATIVE Final    Comment:        The GeneXpert MRSA Assay (FDA approved for NASAL specimens only), is one component of  a comprehensive MRSA colonization surveillance program. It is not intended to diagnose MRSA infection nor to guide or monitor treatment for MRSA infections. Performed at [facility], [address], [place], [place] [address]     RADIOLOGY:  CT Maxillofacial W Contrast  Result Date: 09/12/2019 CLINICAL DATA:  Dental abscess and leukocytosis. EXAM: CT MAXILLOFACIAL WITH CONTRAST TECHNIQUE: Multidetector CT imaging of the maxillofacial structures was performed with intravenous contrast. Multiplanar CT image reconstructions were also generated. CONTRAST:  35mL OMNIPAQUE IOHEXOL 300 MG/ML  SOLN COMPARISON:  None. FINDINGS: Osseous: No facial fracture. Dental: There is poor dentition. There are large periapical lucencies at the roots of the mandibular central and lateral incisors. There is mild adjacent soft tissue swelling. There is dehiscence of the anterior mandibular cortex at the mandibular symphysis. No subperiosteal abscess. Severe carious disease of all of the teeth. Orbits: The globes are intact. Normal appearance of the intra- and extraconal fat. Symmetric extraocular muscles. Sinuses: No fluid levels or advanced mucosal thickening. Soft tissues: Normal visualized extracranial soft tissues. Limited intracranial: Normal. IMPRESSION: Severe dental disease with large periapical lucencies at the roots of teeth 23-26 and cortical dehiscence of the anterior aspect of the mandibular symphysis. Electronically Signed   By: Deatra Robinson M.D.   On: 09/12/2019 17:08     Management plans discussed with the patient, and he is in agreement.  CODE STATUS:     Code Status Orders  (From admission, onward)         Start     Ordered   09/12/19 1729  Full code  Continuous     09/12/19 1728        Code Status History    Date Active Date Inactive  Code Status Order ID Comments User Context   10/11/2018 2247 10/15/2018 1843 Full Code [id number]  Lance Coon, MD Inpatient   08/17/2018 1609  08/23/2018 0100 Full Code [id number]  Vaughan Basta, MD Inpatient   06/29/2017 1354 06/30/2017 1850 Full Code [id number]  Idelle Crouch, MD Inpatient   06/05/2017 0456 06/07/2017 2052 Full Code [id number]  Harrie Foreman, MD Inpatient   06/04/2017 0259 06/05/2017 0456 Full Code [id number]  Saundra Shelling, MD Inpatient   04/17/2017 1055 04/21/2017 1559 Full Code [id number]  Nicholes Mango, MD Inpatient   02/03/2017 1336 02/04/2017 2128 Full Code [id number]  Baxter Hire, MD ED   10/13/2016 0526 10/15/2016 1812 Full Code [id number]  Harrie Foreman, MD Inpatient   09/08/2016 0352 09/08/2016 1837 Full Code [id number]  Hugelmeyer, Alexis, DO Inpatient   08/12/2016 2220 08/13/2016 1710 Full Code [id number]  Theodoro Grist, MD Inpatient   07/04/2016 1224 07/05/2016 1936 Full Code [id number]  Vaughan Basta, MD Inpatient   07/04/2016 1224 07/04/2016 1224 Full Code [id number]  Vaughan Basta, MD Inpatient   04/09/2016 1654 04/11/2016 1409 Full Code [id number]  Theodoro Grist, MD Inpatient   03/26/2016 0118 03/27/2016 1911 Full Code [id number]  Harvie Bridge, DO Inpatient   03/18/2016 0815 03/21/2016 2035 Full Code [id number]  Lytle Butte, MD ED   03/12/2016 1220 03/16/2016 1927 Full Code [id number]  Fritzi Mandes, MD Inpatient   02/18/2016 1659 02/19/2016 1718 Full Code [id number]  Nicholes Mango, MD ED   01/12/2016 0858 01/15/2016 1933 Full Code [id number]  Hillary Bow, MD ED   01/11/2016 0035 01/11/2016 2320 Full Code [id number]  Quintella Baton, MD Inpatient   12/12/2015 2204 12/14/2015 1838 Full Code [id number]  Theodoro Grist, MD Inpatient   10/23/2015 1742 10/25/2015 1904 Full Code [id number]  Epifanio Lesches, MD ED   06/10/2015 0820 06/12/2015 1441 Full Code [id number]  Harrie Foreman, MD Inpatient   06/06/2015 2043 06/07/2015 1620 Full Code [id number]  Fritzi Mandes, MD Inpatient   04/07/2015 1245 04/08/2015 2037 Full Code [id number]  Vaughan Basta, MD Inpatient   12/09/2014 1444 12/13/2014 1616  Full Code [id number]  Aldean Jewett, MD Inpatient   Advance Care Planning Activity      TOTAL TIME TAKING CARE OF THIS PATIENT: 34 minutes.    Loletha Grayer M.D on 09/14/2019 at 1:47 PM  Between 7am to 6pm - Pager - [phone]  After 6pm go to [email or web address] - password [email or web address]  [facility]  CC: Primary care physician; Langston Reusing, NP

## 2019-09-16 ENCOUNTER — Telehealth: Payer: Self-pay | Admitting: Pharmacy Technician

## 2019-09-16 NOTE — Telephone Encounter (Signed)
Received 2021 proof of income.  Patient eligible to receive medication assistance at [facility] until time for re-certification in 7124, and as long as eligibility requirements continue to be met.  [facility]

## 2019-09-17 LAB — HEPATITIS C GENOTYPE: Hepatitis C Genotype: 3

## 2019-09-17 LAB — HCV RNA (INTERNATIONAL UNITS)
HCV log10: 7.301 log10 IU/mL
Hcv Rna (International Units): 20000000 IU/mL

## 2019-09-17 LAB — CULTURE, BLOOD (ROUTINE X 2)
Culture: NO GROWTH
Culture: NO GROWTH
Special Requests: ADEQUATE
Special Requests: ADEQUATE

## 2019-09-17 LAB — HCV RNA BY PCR, QN RFX GENO

## 2019-09-20 ENCOUNTER — Telehealth: Payer: Self-pay | Admitting: Pharmacist

## 2019-09-20 NOTE — Telephone Encounter (Signed)
09/20/2019 12:25:02 PM - Lantus Vials for to [facility]  -- Rhetta Mura - Monday, September 20, 2019 12:23 PM -- I will take [employer] form to [facility] for Lanora Manis to sign, and fill in diagnosis code to renew enrollment with [employer].

## 2019-09-23 ENCOUNTER — Ambulatory Visit: Payer: Medicaid Other | Admitting: Gerontology

## 2019-09-24 ENCOUNTER — Telehealth: Payer: Self-pay | Admitting: Pharmacist

## 2019-09-24 NOTE — Telephone Encounter (Signed)
09/24/2019 1:43:48 PM - Lantus Vials enrollment to [employer]  -- Rhetta Mura - Friday, September 24, 2019 1:42 PM -- Faxed [employer] application for enrollment for Lantus Vials Inject 25 units once daily #3 vials, with Dx code: E10.1.

## 2019-09-28 ENCOUNTER — Ambulatory Visit: Payer: Medicaid Other | Admitting: Gerontology

## 2019-09-28 ENCOUNTER — Telehealth: Payer: Self-pay | Admitting: Pharmacist

## 2019-09-28 ENCOUNTER — Encounter: Payer: Self-pay | Admitting: Gastroenterology

## 2019-09-28 ENCOUNTER — Ambulatory Visit: Payer: Self-pay | Admitting: Gastroenterology

## 2019-09-28 NOTE — Progress Notes (Deleted)
Primary Care Physician: Langston Reusing, NP  Primary Gastroenterologist:  Dr. Lucilla Lame  No chief complaint on file.   HPI: Trevor Brown is a 29 y.o. male here for follow-up after discharge from the hospital.  This patient was seen in the hospital with poorly controlled diabetes and polysubstance abuse with a report of the patient vomiting with blood-tinged material.  The patient had severe dental disease and a large periapical lucencies at the roots of the teeth.  Due to his dental procedures no endoscopies were done.  At that time he had severe swelling of his left lower jaw and was unable to open his mouth.  His hemoglobin on admission to the hospital is 14.1 and he had a white cell count of 11.4.  He was started on pantoprazole when he was seen in the hospital by Dr. Marius Ditch.  The patient had been taking BC powders and NSAIDs for the dental pain.  The patient also has a history of hepatitis C   Past Medical History:  Diagnosis Date  . Heart attack ([place])    Pt claims he had heart attack a year ago ( 2015)- and was admitted in [facility] for that, but not given any meds or angiogram, on review of chart- I could not find any details like that.  . Hepatitis C, acute may 2016  . Heroin abuse ([place])   . History of noncompliance with medical treatment   . Hypertension   . Type 1 diabetes (HCC)     Current Outpatient Medications  Medication Sig Dispense Refill  . gabapentin (NEURONTIN) 100 MG capsule Take 2 capsules (200 mg total) by mouth at bedtime. 60 capsule 3  . glucose blood (FREESTYLE LITE) test strip To be used as directed for checking her blood sugar 60 each 5  . insulin aspart (NOVOLOG) 100 UNIT/ML injection Inject 5-10 Units into the skin See admin instructions. INJECT BEFORE MEALS PER SLIDING SCALE AS DIRECTED: 80-250 = 5 UNITS;251-350 = 8 UNITS; OVER 350 = 10 UNITS 50 mL 2  . Insulin Glargine (BASAGLAR KWIKPEN) 100 UNIT/ML SOPN Inject 0.25 mLs (25 Units total) into the skin  daily. 7.5 mL 0  . nicotine (NICODERM CQ - DOSED IN MG/24 HOURS) 21 mg/24hr patch 21mg  patch chest wall daily (okay to substitute generic) 28 patch 0  . oxyCODONE (ROXICODONE) 5 MG immediate release tablet Take 1 tablet (5 mg total) by mouth every 8 (eight) hours as needed for severe pain. 9 tablet 0  . pantoprazole (PROTONIX) 40 MG tablet Take 1 tablet (40 mg total) by mouth daily. 30 tablet 0   No current facility-administered medications for this visit.    Allergies as of 09/28/2019 - Review Complete 09/13/2019  Allergen Reaction Noted  . Bee venom Anaphylaxis 12/09/2014  . Penicillins Anaphylaxis, Hives, and Other (See Comments) 12/09/2014  . Ibuprofen Itching, Rash, and Other (See Comments) 04/15/2014  . Tramadol Hives and Swelling 06/25/2014  . Vancomycin Rash and Other (See Comments) 06/05/2015    ROS:  General: Negative for anorexia, weight loss, fever, chills, fatigue, weakness. ENT: Negative for hoarseness, difficulty swallowing , nasal congestion. CV: Negative for chest pain, angina, palpitations, dyspnea on exertion, peripheral edema.  Respiratory: Negative for dyspnea at rest, dyspnea on exertion, cough, sputum, wheezing.  GI: See history of present illness. GU:  Negative for dysuria, hematuria, urinary incontinence, urinary frequency, nocturnal urination.  Endo: Negative for unusual weight change.    Physical Examination:   There were no vitals taken  for this visit.  General: Well-nourished, well-developed in no acute distress.  Eyes: No icterus. Conjunctivae pink. Lungs: Clear to auscultation bilaterally. Non-labored. Heart: Regular rate and rhythm, no murmurs rubs or gallops.  Abdomen: Bowel sounds are normal, nontender, nondistended, no hepatosplenomegaly or masses, no abdominal bruits or hernia , no rebound or guarding.   Extremities: No lower extremity edema. No clubbing or deformities. Neuro: Alert and oriented x 3.  Grossly intact. Skin: Warm and dry, no  jaundice.   Psych: Alert and cooperative, normal mood and affect.  Labs:    Imaging Studies: CT Maxillofacial W Contrast  Result Date: 09/12/2019 CLINICAL DATA:  Dental abscess and leukocytosis. EXAM: CT MAXILLOFACIAL WITH CONTRAST TECHNIQUE: Multidetector CT imaging of the maxillofacial structures was performed with intravenous contrast. Multiplanar CT image reconstructions were also generated. CONTRAST:  8mL OMNIPAQUE IOHEXOL 300 MG/ML  SOLN COMPARISON:  None. FINDINGS: Osseous: No facial fracture. Dental: There is poor dentition. There are large periapical lucencies at the roots of the mandibular central and lateral incisors. There is mild adjacent soft tissue swelling. There is dehiscence of the anterior mandibular cortex at the mandibular symphysis. No subperiosteal abscess. Severe carious disease of all of the teeth. Orbits: The globes are intact. Normal appearance of the intra- and extraconal fat. Symmetric extraocular muscles. Sinuses: No fluid levels or advanced mucosal thickening. Soft tissues: Normal visualized extracranial soft tissues. Limited intracranial: Normal. IMPRESSION: Severe dental disease with large periapical lucencies at the roots of teeth 23-26 and cortical dehiscence of the anterior aspect of the mandibular symphysis. Electronically Signed   By: Deatra Robinson M.D.   On: 09/12/2019 17:08   DG Chest Portable 1 View  Result Date: 09/12/2019 CLINICAL DATA:  Weakness with vomiting as well as coughing. Toothache. EXAM: PORTABLE CHEST 1 VIEW COMPARISON:  04/11/2019 FINDINGS: Lungs are adequately inflated without focal airspace consolidation or effusion. Cardiomediastinal silhouette, bones and soft tissues are normal. IMPRESSION: No active disease. Electronically Signed   By: Elberta Fortis M.D.   On: 09/12/2019 13:15    Assessment and Plan:   Trevor Brown is a 29 y.o. y/o male ***     Midge Minium, MD. Clementeen Graham    Note: This dictation was prepared with Dragon dictation along  with smaller phrase technology. Any transcriptional errors that result from this process are unintentional.

## 2019-09-28 NOTE — Telephone Encounter (Signed)
09/28/2019 3:18:39 PM - Lantus Vials corrected Diagnosis  -- Rhetta Mura - Tuesday, September 28, 2019 3:16 PM -- Received notice from [employer] stating "OFF LABEL DIAGNOSIS"- I got Lanora Manis in our office today to review and the diagnosis changed to E10.65--I have refaxed to [employer] for processing.

## 2019-10-05 ENCOUNTER — Ambulatory Visit: Payer: Medicaid Other | Admitting: Gerontology

## 2019-10-12 ENCOUNTER — Ambulatory Visit: Payer: Medicaid Other | Admitting: Gerontology

## 2019-10-12 ENCOUNTER — Telehealth: Payer: Self-pay | Admitting: Gerontology

## 2019-10-12 IMAGING — CR DG CHEST 2V
2 series · 2 of 2 positions shown · non-contrast
Comparison: 10/07/2018

CLINICAL DATA: Right side chest/ upper rib pain x 2 days HTN, hep
C, DM, smoker- 2 packs/day

EXAM:
CHEST - 2 VIEW

[chest pa]
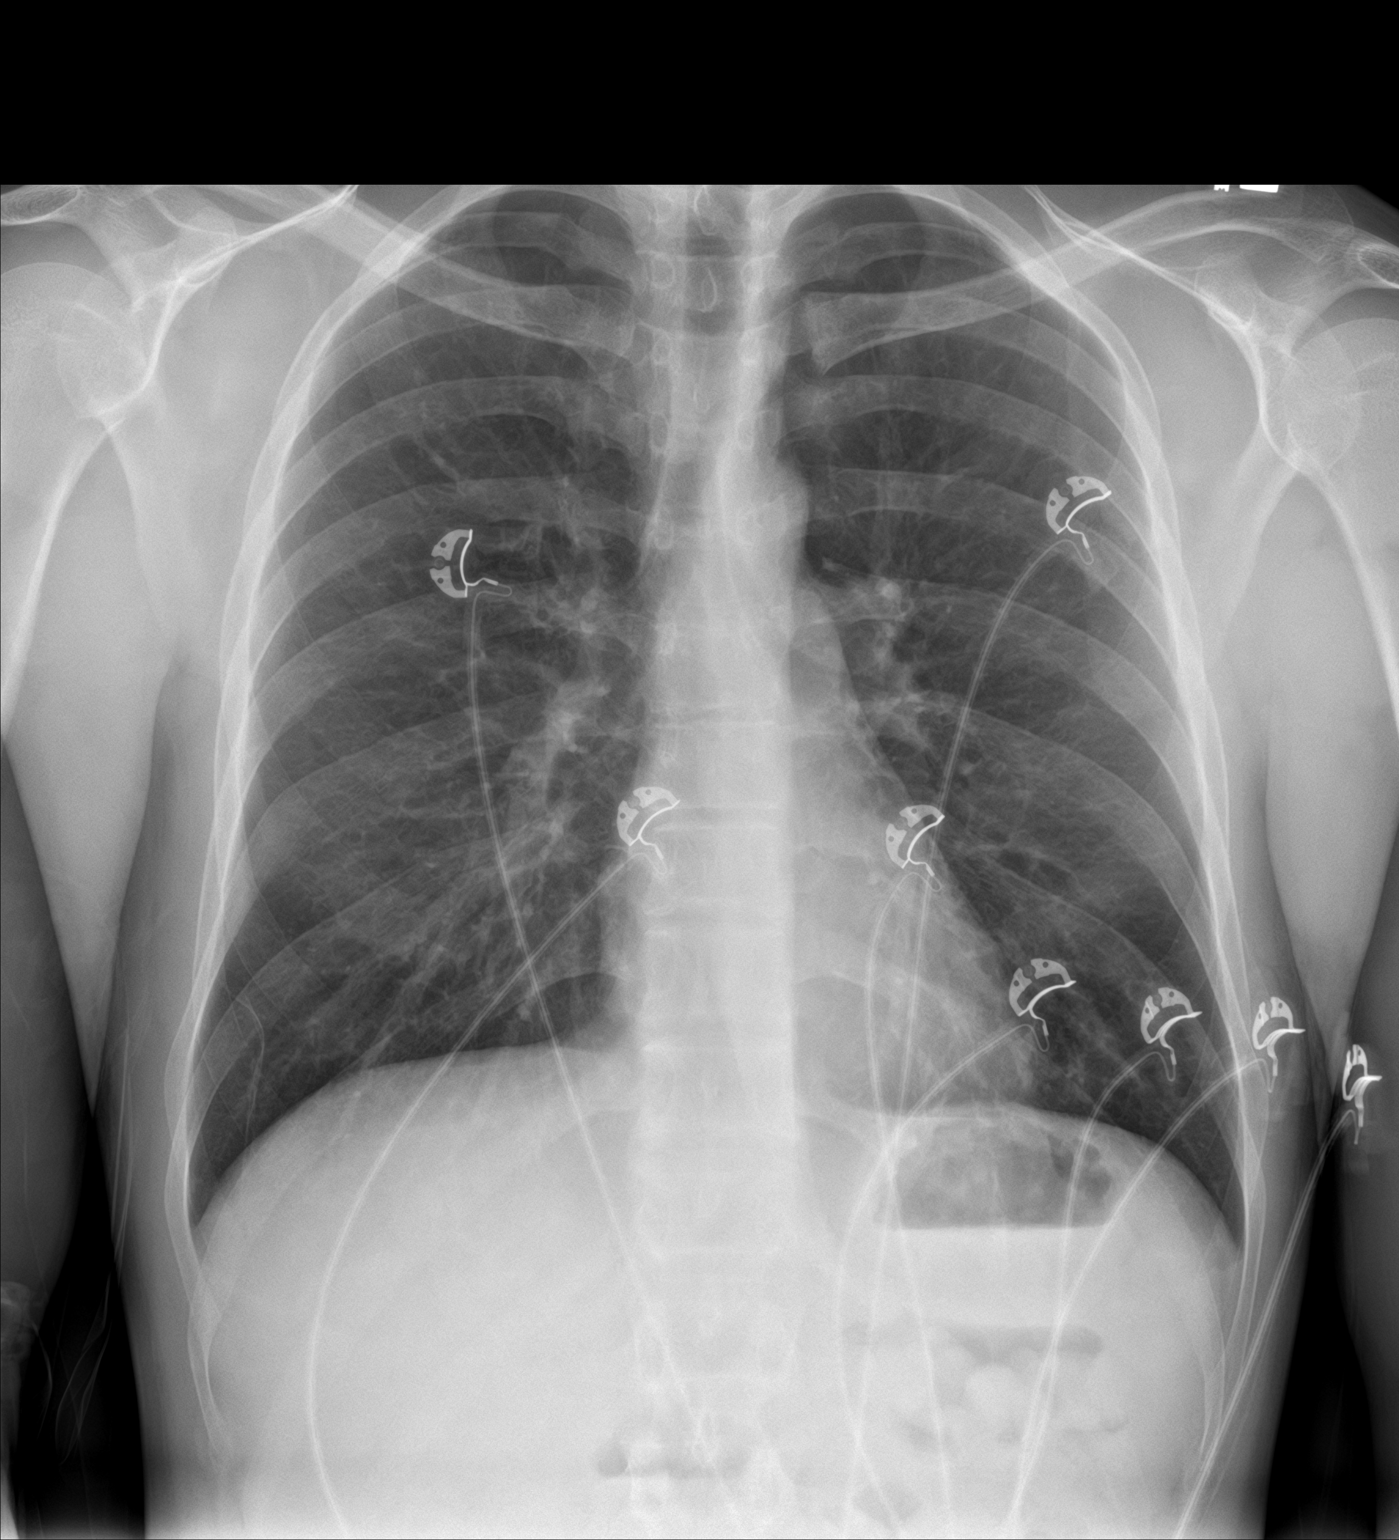

[chest lat]
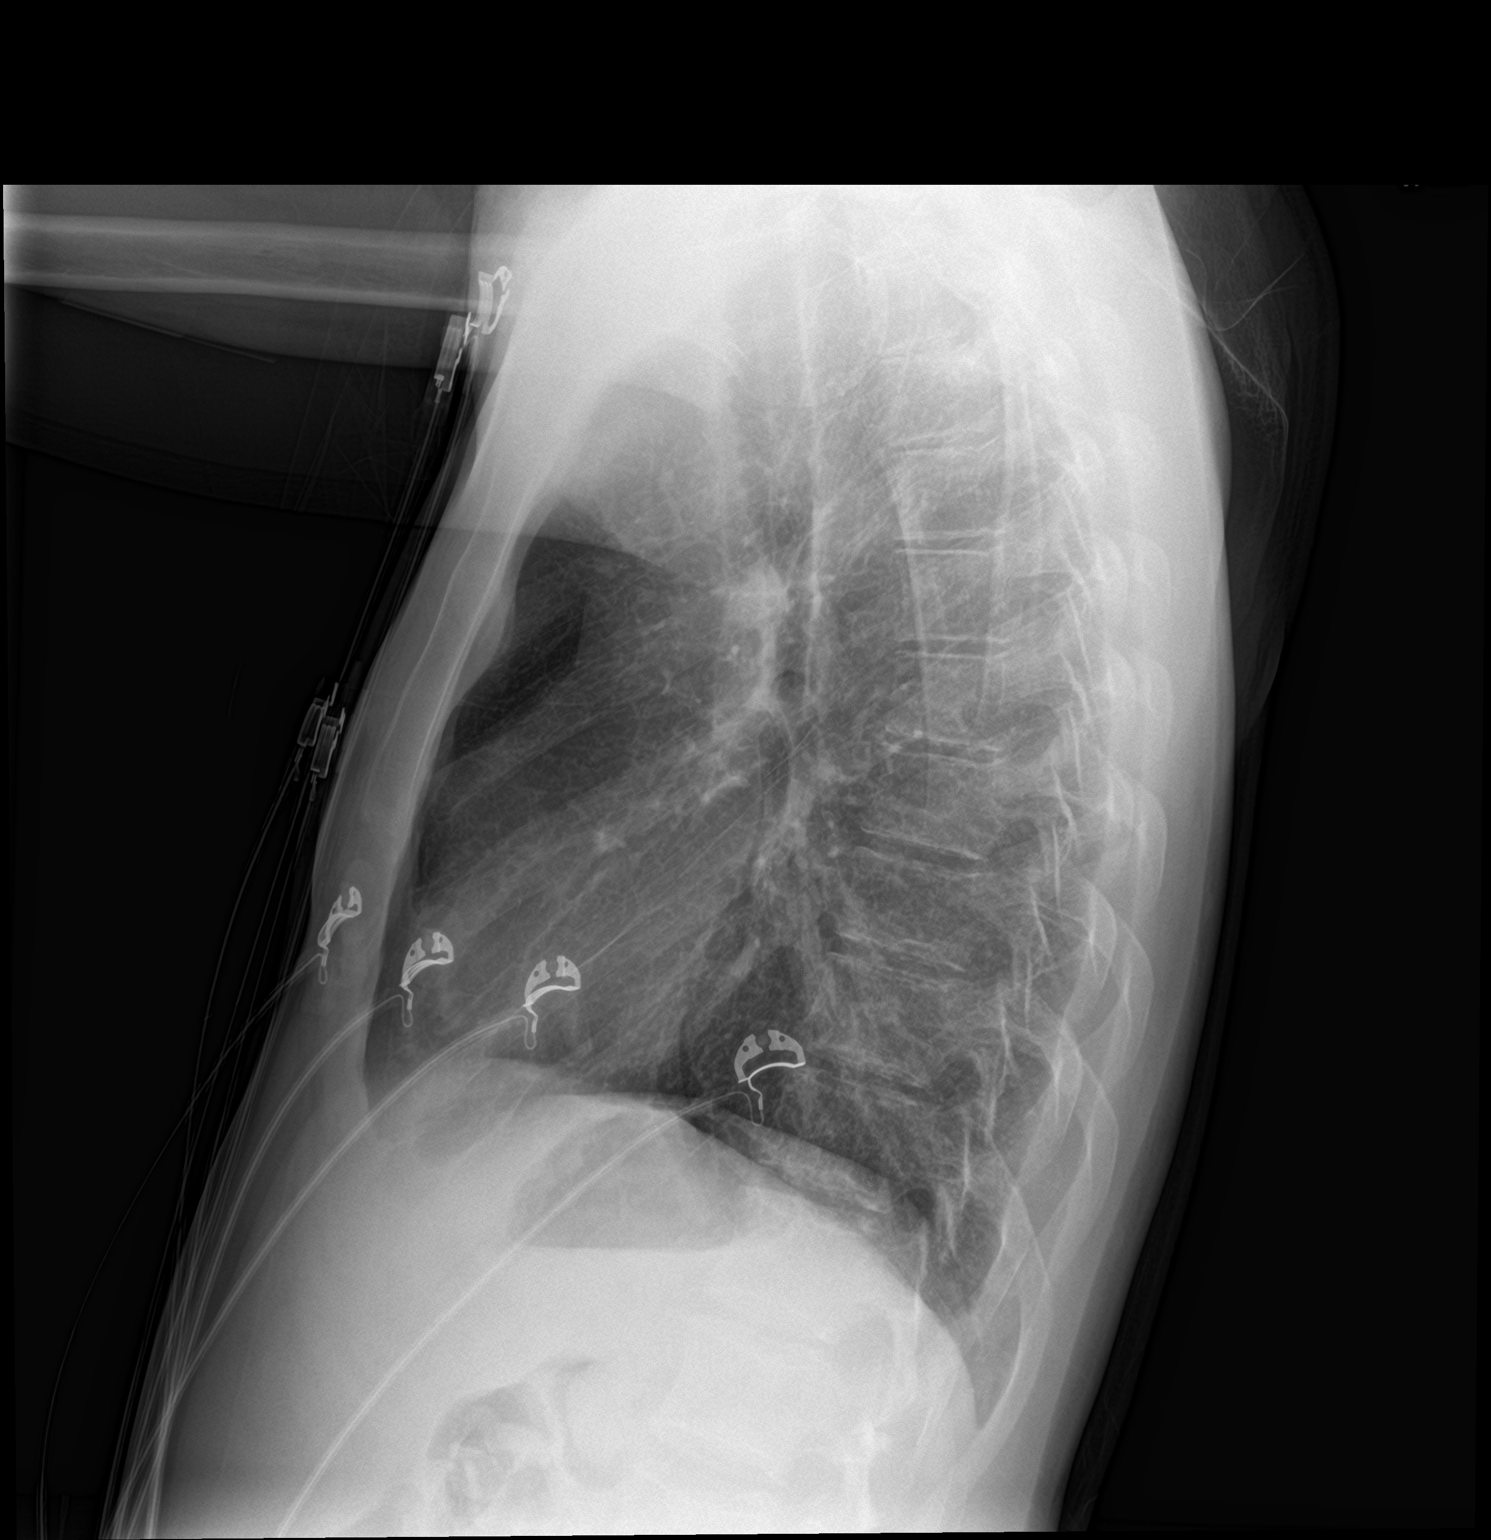

[2 of 2 positions shown; findings below may reference images not displayed]

FINDINGS: Lungs are clear.

Heart size and mediastinal contours are within normal limits.

No effusion.  No pneumothorax.

Visualized bones unremarkable.
IMPRESSION: No acute cardiopulmonary disease.

## 2019-10-12 NOTE — Telephone Encounter (Signed)
Called Trevor Brown 10:51 10/12/19, unable to leave vm because no established message box. Rescheduled no showed appt from today for Tuesday, April 6 for 9:30am.

## 2019-10-13 ENCOUNTER — Other Ambulatory Visit: Payer: Self-pay

## 2019-10-26 ENCOUNTER — Ambulatory Visit: Payer: Medicaid Other | Admitting: Gerontology

## 2019-10-28 ENCOUNTER — Ambulatory Visit: Payer: Medicaid Other

## 2019-10-28 ENCOUNTER — Other Ambulatory Visit: Payer: Self-pay

## 2019-10-28 ENCOUNTER — Telehealth: Payer: Self-pay | Admitting: Gerontology

## 2019-10-28 ENCOUNTER — Ambulatory Visit: Payer: Medicaid Other | Admitting: Family Medicine

## 2019-10-28 VITALS — Ht 64.0 in | Wt 120.0 lb

## 2019-10-28 DIAGNOSIS — E108 Type 1 diabetes mellitus with unspecified complications: Secondary | ICD-10-CM

## 2019-10-28 DIAGNOSIS — E1065 Type 1 diabetes mellitus with hyperglycemia: Secondary | ICD-10-CM

## 2019-10-28 MED ORDER — BASAGLAR KWIKPEN 100 UNIT/ML ~~LOC~~ SOPN: 25.0000 [IU] | PEN_INJECTOR | Freq: Every day | SUBCUTANEOUS | 0 refills | Status: DC

## 2019-10-28 MED ORDER — INSULIN ASPART 100 UNIT/ML ~~LOC~~ SOLN: 5.0000 [IU] | SUBCUTANEOUS | 2 refills | Status: DC

## 2019-10-28 NOTE — Progress Notes (Signed)
   Virtual Visit via Telephone Note  I connected with [name] on 10/28/19 at  5:30 PM EDT by telephone and verified that I am speaking with the correct person using two identifiers.   I discussed the limitations, risks, security and privacy concerns of performing an evaluation and management service by telephone and the availability of in person appointments. I also discussed with the patient that there may be a patient responsible charge related to this service. The patient expressed understanding and agreed to proceed. Patient location: Home Provider:Off site.   History of Present Illness: Trevor Brown  has a past medical history of Heart attack (HCC), Hepatitis C, acute (may 2016), Heroin abuse (HCC), History of noncompliance with medical treatment, Hypertension, and Type 1 diabetes (HCC). Patient reports that his blood glucose level have been fluctuating for the past week. He reports that he has had numbers as low as 40 and as high as 500. Patient states that he has not been able to eat food and has been vomiting for the past 2 days. Patient states that he is not able to keep anything down. He states that he feels weak. His last blood sugar check was this AM and he reports it being in the low 200s .  Patient states that he takes a sliding scale for his insulin and has not been taking his Basaglar for the past week.  Observations/Objective: Patient with regular voice tone, rate and rhythm. Speaking calmly and is in no apparent distress.    Assessment and Plan: 1. Type 1 diabetes mellitus with complications (HCC) Instructed patient to go to the nearest ED for further evaluation and treatment as he may be dehydrated. Patient with a previous history of DKA. Patient states that he will call his friend to come to the home and take him to the ED tonight. Advised patient that this provider recommends he call 911 to take him to the ED. Patient refused due to cost. Advised patient of the severity  of symptoms and he agreed to call his friend for transportation.     Follow Up Instructions:  Patient advised to go to the ED for further evaluation. He will need to make a follow up appointment with his PCP once released.   I provided 10 minutes of non-face-to-face time during this encounter.  Ms. Andr L. Riley Lam, FNP-BC [facility] [address] [place], [place] [address] [phone]

## 2019-10-28 NOTE — Patient Instructions (Signed)
Diabetic Ketoacidosis °Diabetic ketoacidosis is a serious complication of diabetes. This condition develops when there is not enough insulin in the body. Insulin is an hormone that regulates blood sugar levels in the body. Normally, insulin allows glucose to enter the cells in the body. The cells break down glucose for energy. Without enough insulin, the body cannot break down glucose, so it breaks down fats instead. This leads to high blood glucose levels in the body and the production of acids that are called ketones. Ketones are poisonous at high levels. °If diabetic ketoacidosis is not treated, it can cause severe dehydration and can lead to a coma or death. °What are the causes? °This condition develops when a lack of insulin causes the body to break down fats instead of glucose. This may be triggered by: °· Stress on the body. This stress is brought on by an illness. °· Infection. °· Medicines that raise blood glucose levels. °· Not taking diabetes medicine. °· New onset of type 1 diabetes mellitus. °What are the signs or symptoms? °Symptoms of this condition include: °· Fatigue. °· Weight loss. °· Excessive thirst. °· Light-headedness. °· Fruity or sweet-smelling breath. °· Excessive urination. °· Vision changes. °· Confusion or irritability. °· Nausea. °· Vomiting. °· Rapid breathing. °· Abdominal pain. °· Feeling flushed. °How is this diagnosed? °This condition is diagnosed based on your medical history, a physical exam, and blood tests. You may also have a urine test to check for ketones. °How is this treated? °This condition may be treated with: °· Fluid replacement. This may be done to correct dehydration. °· Insulin injections. These may be given through the skin or through an IV tube. °· Electrolyte replacement. Electrolytes are minerals in your blood. Electrolytes such as potassium and sodium may be given in pill form or through an IV tube. °· Antibiotic medicines. These may be prescribed if your  condition was caused by an infection. °Diabetic ketoacidosis is a serious medical condition. You may need emergency treatment in the hospital to monitor your condition. °Follow these instructions at home: °Eating and drinking °· Drink enough fluids to keep your urine clear or pale yellow. °· If you are not able to eat, drink clear fluids in small amounts as you are able. Clear fluids include water, ice chips, fruit juice with water added (diluted), and low-calorie sports drinks. You may also have sugar-free jello or popsicles. °· If you are able to eat, follow your usual diet and drink sugar-free liquids, such as water. °Medicines °· Take over-the-counter and prescription medicines only as told by your health care provider. °· Continue to take insulin and other diabetes medicines as told by your health care provider. °· If you were prescribed an antibiotic, take it as told by your health care provider. Do not stop taking the antibiotic even if you start to feel better. °General instructions ° °· Check your urine for ketones when you are ill and as told by your health care provider. °? If your blood glucose is 240 mg/dL (13.3 mmol/L) or higher, check your urine ketones every 4-6 hours. °· Check your blood glucose every day, as often as told by your health care provider. °? If your blood glucose is high, drink plenty of fluids. This helps to flush out ketones. °? If your blood glucose is above your target for 2 tests in a row, contact your health care provider. °· Carry a medical alert card or wear medical alert jewelry that says that you have diabetes. °· Rest   and exercise only as told by your health care provider. Do not exercise when your blood glucose is high and you have ketones in your urine. °· If you get sick, call your health care provider and begin treatment quickly. Your body often needs extra insulin to fight an illness. Check your blood glucose every 4-6 hours when you are sick. °· Keep all follow-up  visits as told by your health care provider. This is important. °Contact a health care provider if: °· Your blood glucose level is higher than 240 mg/dL (13.3 mmol/L) for 2 days in a row. °· You have moderate or large ketones in your urine. °· You have a fever. °· You cannot eat or drink without vomiting. °· You have been vomiting for more than 2 hours. °· You continue to have symptoms of diabetic ketoacidosis. °· You develop new symptoms. °Get help right away if: °· Your blood glucose monitor reads “high” even when you are taking insulin. °· You faint. °· You have chest pain. °· You have trouble breathing. °· You have sudden trouble speaking or swallowing. °· You have vomiting or diarrhea that gets worse after 3 hours. °· You are unable to stay awake. °· You have trouble thinking. °· You are severely dehydrated. Symptoms of severe dehydration include: °? Extreme thirst. °? Dry mouth. °? Rapid breathing. °These symptoms may represent a serious problem that is an emergency. Do not wait to see if the symptoms will go away. Get medical help right away. Call your local emergency services (911 in the [place] Do not drive yourself to the hospital. °Summary °· Diabetic ketoacidosis is a serious complication of diabetes. This condition develops when there is not enough insulin in the body. °· This condition is diagnosed based on your medical history, a physical exam, and blood tests. You may also have a urine test to check for ketones. °· Diabetic ketoacidosis is a serious medical condition. You may need emergency treatment in the hospital to monitor your condition. °· Contact your health care provider if your blood glucose is higher than 240 mg/dl for 2 days in a row or if you have moderate or large ketones in your urine. °This information is not intended to replace advice given to you by your health care provider. Make sure you discuss any questions you have with your health care provider. °Document Revised: 08/23/2016  Document Reviewed: 08/12/2016 °Elsevier Patient Education © 2020 [employer] ° °

## 2019-10-28 NOTE — Telephone Encounter (Signed)
Called to check pt out but VM was not set up

## 2019-10-29 ENCOUNTER — Encounter: Payer: Self-pay | Admitting: Emergency Medicine

## 2019-10-29 ENCOUNTER — Other Ambulatory Visit: Payer: Self-pay

## 2019-10-29 ENCOUNTER — Emergency Department
Admission: EM | Admit: 2019-10-29 | Discharge: 2019-10-29 | Disposition: A | Discharge status: Home / Self Care | Payer: Medicaid Other | Attending: Emergency Medicine | Admitting: Emergency Medicine

## 2019-10-29 DIAGNOSIS — Z79899 Other long term (current) drug therapy: Secondary | ICD-10-CM | POA: Insufficient documentation

## 2019-10-29 DIAGNOSIS — F17210 Nicotine dependence, cigarettes, uncomplicated: Secondary | ICD-10-CM | POA: Insufficient documentation

## 2019-10-29 DIAGNOSIS — E1065 Type 1 diabetes mellitus with hyperglycemia: Secondary | ICD-10-CM | POA: Insufficient documentation

## 2019-10-29 LAB — COMPREHENSIVE METABOLIC PANEL
ALT: 69 U/L — ABNORMAL HIGH (ref 0–44)
AST: 63 U/L — ABNORMAL HIGH (ref 15–41)
Albumin: 4.5 g/dL (ref 3.5–5.0)
Alkaline Phosphatase: 87 U/L (ref 38–126)
Anion gap: 10 (ref 5–15)
BUN: 26 mg/dL — ABNORMAL HIGH (ref 6–20)
CO2: 27 mmol/L (ref 22–32)
Calcium: 9.4 mg/dL (ref 8.9–10.3)
Chloride: 95 mmol/L — ABNORMAL LOW (ref 98–111)
Creatinine, Ser: 0.93 mg/dL (ref 0.61–1.24)
GFR calc Af Amer: >60 mL/min (ref >60)
GFR calc non Af Amer: >60 mL/min (ref >60)
Glucose, Bld: 295 mg/dL — ABNORMAL HIGH (ref 70–99)
Potassium: 4.3 mmol/L (ref 3.5–5.1)
Sodium: 132 mmol/L — ABNORMAL LOW (ref 135–145)
Total Bilirubin: 1.2 mg/dL (ref 0.3–1.2)
Total Protein: 7.6 g/dL (ref 6.5–8.1)

## 2019-10-29 LAB — CBC
HCT: 41.6 % (ref 39.0–52.0)
Hemoglobin: 13.9 g/dL (ref 13.0–17.0)
MCH: 29.7 pg (ref 26.0–34.0)
MCHC: 33.4 g/dL (ref 30.0–36.0)
MCV: 88.9 fL (ref 80.0–100.0)
Platelets: 200 10*3/uL (ref 150–400)
RBC: 4.68 MIL/uL (ref 4.22–5.81)
RDW: 13.6 % (ref 11.5–15.5)
WBC: 8.1 10*3/uL (ref 4.0–10.5)
nRBC: 0 % (ref 0.0–0.2)

## 2019-10-29 LAB — LIPASE, BLOOD: Lipase: 14 U/L (ref 11–51)

## 2019-10-29 LAB — GLUCOSE, CAPILLARY: Glucose-Capillary: 279 mg/dL — ABNORMAL HIGH (ref 70–99)

## 2019-10-29 MED ORDER — HALOPERIDOL LACTATE 5 MG/ML IJ SOLN
2.5000 mg | Freq: Once | INTRAMUSCULAR | Status: AC
  Administered 2019-10-29: 2.5 mg via INTRAVENOUS
  Filled 2019-10-29: qty 1

## 2019-10-29 MED ORDER — INSULIN ASPART 100 UNIT/ML ~~LOC~~ SOLN
5.0000 [IU] | Freq: Once | SUBCUTANEOUS | Status: AC
  Administered 2019-10-29: 5 [IU] via INTRAVENOUS
  Filled 2019-10-29: qty 1

## 2019-10-29 MED ORDER — ONDANSETRON 4 MG PO TBDP: 4.0000 mg | ORAL_TABLET | Freq: Three times a day (TID) | ORAL | 0 refills | Status: DC | PRN

## 2019-10-29 MED ORDER — DICYCLOMINE HCL 10 MG/ML IM SOLN
20.0000 mg | Freq: Once | INTRAMUSCULAR | Status: AC
  Administered 2019-10-29: 20 mg via INTRAMUSCULAR
  Filled 2019-10-29: qty 2

## 2019-10-29 MED ORDER — FAMOTIDINE 20 MG PO TABS: 20.0000 mg | ORAL_TABLET | Freq: Two times a day (BID) | ORAL | 0 refills | Status: DC

## 2019-10-29 MED ORDER — SODIUM CHLORIDE 0.9 % IV BOLUS
1000.0000 mL | Freq: Once | INTRAVENOUS | Status: AC
  Administered 2019-10-29: 1000 mL via INTRAVENOUS

## 2019-10-29 MED ORDER — SODIUM CHLORIDE 0.9% FLUSH
3.0000 mL | Freq: Once | INTRAVENOUS | Status: AC
  Administered 2019-10-29: 3 mL via INTRAVENOUS

## 2019-10-29 NOTE — ED Notes (Signed)
MD stafford at bedside. 

## 2019-10-29 NOTE — ED Notes (Signed)
Pt repeatedly requesting pain medication, stating abdominal pain/cramping. Pt notified that RN contacted pharmacy and waiting on bentyl for abdominal cramping. Dr. Scotty Court notified

## 2019-10-29 NOTE — Progress Notes (Signed)
Inpatient Diabetes Program Recommendations  AACE/ADA: New Consensus Statement on Inpatient Glycemic Control (2015)  Target Ranges:  Prepandial:   less than 140 mg/dL      Peak postprandial:   less than 180 mg/dL (1-2 hours)      Critically ill patients:  140 - 180 mg/dL   Lab Results  Component Value Date   GLUCAP 279 (H) 10/29/2019   HGBA1C 9.3 (H) 09/12/2019    Review of Glycemic Control  Diabetes history: DM1 (makes NO insulin; requires basal, correction, and meal coverage insulin) Outpatient Diabetes medications: Basaglar 25 units qd + Novolog scale tid ac meals Current orders for Inpatient glycemic control: No orders yet  Inpatient Diabetes Program Recommendations:   Patient currently in ED. -Give 80% basal = Lantus 20 units daily starting now.  Noted note from Mike Gip that states patient has not taken his Basaglar insulin in over the past week and only using Novolog.   NOTE from admission 10/14/18: Very difficult for patientto control his glucose at home given his issues with homelessness in the past and his issues with IV Drug Abuse. Poor social support.  Thank you, Billy Fischer. Marquett Bertoli, RN, MSN, CDE  Diabetes Coordinator Inpatient Glycemic Control Team Team Pager [phone] (8am-5pm) 10/29/2019 2:36 PM

## 2019-10-29 NOTE — ED Provider Notes (Signed)
[facility] Emergency Department Provider Note  ____________________________________________  Time seen: Approximately 2:18 PM  I have reviewed the triage vital signs and the nursing notes.   HISTORY  Chief Complaint Emesis    HPI Trevor Brown is a 29 y.o. male with a history of heroin abuse, hepatitis C, hypertension, type 1 diabetes who comes the ED complaining of vomiting for the past 3 days and being unable to eat or drink anything.  States that he has been taking his medications including his insulins, no changes to his medication regimen recently.  Denies pain shortness of breath but endorses chills.  No body aches or chest pain, no dysuria.  States that his blood sugars been about 500 for the past week.  He is worried he might be in DKA.     Past Medical History:  Diagnosis Date  . Heart attack (HCC)    Pt claims he had heart attack a year ago ( 2015)- and was admitted in [facility] for that, but not given any meds or angiogram, on review of chart- I could not find any details like that.  . Hepatitis C, acute may 2016  . Heroin abuse (HCC)   . History of noncompliance with medical treatment   . Hypertension   . Type 1 diabetes [facility])      Patient Active Problem List   Diagnosis Date Noted  . Hypokalemia   . Sepsis without acute organ dysfunction (HCC)   . Dental abscess 09/12/2019  . Intractable vomiting   . Hematemesis with nausea   . Cannabis hyperemesis syndrome concurrent with and due to cannabis abuse (HCC)   . Elevated LFTs   . Abnormal laboratory test result 07/20/2019  . Smoking 07/20/2019  . History of hepatitis C 02/25/2019  . Health care maintenance 02/25/2019  . Peripheral neuropathy 02/25/2019  . Hand abscess 10/13/2018  . IVDU (intravenous drug user) 10/11/2018  . Hyperkalemia 10/11/2018  . Cellulitis and abscess of hand 08/17/2018  . Polysubstance abuse (HCC) 01/20/2018  . Abdominal pain 06/29/2017  . Hyponatremia 06/29/2017   . DKA (diabetic ketoacidosis) (HCC) 06/29/2017  . Dental erosion extending into pulp 12/31/2016  . 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 Elevated transaminase level 03/27/2016  . Diabetes mellitus type 1 (HCC) 03/27/2016  . Tobacco abuse counseling 03/27/2016  . Transaminitis 03/25/2016  . Leukocytosis 01/13/2016  . Cannabinoid hyperemesis syndrome 01/10/2016  . Tobacco abuse 01/10/2016  . Type 1 diabetes mellitus with hyperglycemia (HCC) 12/12/2015  . Cocaine abuse (HCC) 12/12/2015  . DKA (diabetic ketoacidoses) (HCC) 06/10/2015  . Hidradenitis suppurativa of left axilla   . DKA, type 1 (HCC) 06/06/2015  . Malnutrition of moderate degree (HCC) 04/08/2015  . Hepatitis C 12/13/2014  . Diabetes type 1, uncontrolled (HCC) 12/11/2014  . Major depressive disorder, single episode, mild (HCC)   . Major depression, single episode 12/10/2014  . Diabetes mellitus type 1, uncontrolled (HCC) 12/09/2014  . Homelessness 12/09/2014     Past Surgical History:  Procedure Laterality Date  . NO PAST SURGERIES    . none       Prior to Admission medications   Medication Sig Start Date End Date Taking? Authorizing Provider  famotidine (PEPCID) 20 MG tablet Take 1 tablet (20 mg total) by mouth 2 (two) times daily. 10/29/19   Sharman Cheek, MD  gabapentin (NEURONTIN) 100 MG capsule Take 2 capsules (200 mg total) by mouth at bedtime. 06/28/19   Iloabachie, [name], NP  glucose blood (FREESTYLE LITE) test strip  To be used as directed for checking her blood sugar 06/28/19   Iloabachie, [name], NP  insulin aspart (NOVOLOG) 100 UNIT/ML injection Inject 5-10 Units into the skin See admin instructions. INJECT BEFORE MEALS PER SLIDING SCALE AS DIRECTED: 80-250 = 5 UNITS;251-350 = 8 UNITS; OVER 350 = 10 UNITS 10/28/19   Mike Gip, FNP  Insulin Glargine [facility] KWIKPEN) 100 UNIT/ML Inject 0.25 mLs (25 Units total) into the skin daily. 10/28/19 11/27/19  Mike Gip, FNP  ondansetron (ZOFRAN ODT) 4 MG  disintegrating tablet Take 1 tablet (4 mg total) by mouth every 8 (eight) hours as needed for nausea or vomiting. 10/29/19   Sharman Cheek, MD  pantoprazole (PROTONIX) 40 MG tablet Take 1 tablet (40 mg total) by mouth daily. 09/14/19 09/13/20  Alford Highland, MD     Allergies Bee venom, Penicillins, Ibuprofen, Tramadol, and Vancomycin   Family History  Problem Relation Age of Onset  . Cirrhosis Mother   . Diabetes Mellitus II Maternal Grandmother   . Diabetes Mellitus II Maternal Grandfather   . CAD Father     Social History Social History   Tobacco Use  . Smoking status: Current Every Day Smoker    Packs/day: 2.00    Types: Cigarettes  . Smokeless tobacco: Never Used  Substance Use Topics  . Alcohol use: No    Alcohol/week: 0.0 standard drinks  . Drug use: Not Currently    Types: IV, Heroin, Marijuana    Comment: heroin- last used about 1 week ago    Review of Systems  Constitutional:   No fever positive chills.  ENT:   No sore throat. No rhinorrhea. Cardiovascular:   No chest pain or syncope. Respiratory:   No dyspnea or cough. Gastrointestinal:   Negative for abdominal pain, vomiting and diarrhea.  Musculoskeletal:   Negative for focal pain or swelling All other systems reviewed and are negative except as documented above in ROS and HPI.  ____________________________________________   PHYSICAL EXAM:  VITAL SIGNS: ED Triage Vitals [10/29/19 1102]  Enc Vitals Group     BP 124/74     Pulse Rate 71     Resp 16     Temp 98.4 F (36.9 C)     Temp Source Oral     SpO2 99 %     Weight 120 lb (54.4 kg)     Height 5\' 4"  (1.626 m)     Head Circumference      Peak Flow      Pain Score      Pain Loc      Pain Edu?      Excl. in GC?     Vital signs reviewed, nursing assessments reviewed.   Constitutional:   Alert and oriented. Non-toxic appearance. Eyes:   Conjunctivae are normal. EOMI. PERRL. ENT      Head:   Normocephalic and atraumatic.      Nose:    Wearing a mask.      Mouth/Throat:   Wearing a mask.      Neck:   No meningismus. Full ROM. Hematological/Lymphatic/Immunilogical:   No cervical lymphadenopathy. Cardiovascular:   RRR. Symmetric bilateral radial and DP pulses.  No murmurs. Cap refill less than 2 seconds. Respiratory:   Normal respiratory effort without tachypnea/retractions. Breath sounds are clear and equal bilaterally. No wheezes/rales/rhonchi. Gastrointestinal:   Soft with mild left upper quadrant tenderness. Non distended.  No rebound, rigidity, or guarding. Musculoskeletal:   Normal range of motion in all extremities. No joint effusions.  No lower extremity tenderness.  No edema. Neurologic:   Normal speech and language.  Motor grossly intact. No acute focal neurologic deficits are appreciated.  Skin:    Skin is warm, dry and intact. No rash noted.  No petechiae, purpura, or bullae.  ____________________________________________    LABS (pertinent positives/negatives) (all labs ordered are listed, but only abnormal results are displayed) Labs Reviewed  COMPREHENSIVE METABOLIC PANEL - Abnormal; Notable for the following components:      Result Value   Sodium 132 (*)    Chloride 95 (*)    Glucose, Bld 295 (*)    BUN 26 (*)    AST 63 (*)    ALT 69 (*)    All other components within normal limits  GLUCOSE, CAPILLARY - Abnormal; Notable for the following components:   Glucose-Capillary 279 (*)    All other components within normal limits  LIPASE, BLOOD  CBC  URINALYSIS, COMPLETE (UACMP) WITH MICROSCOPIC  CBG MONITORING, ED   ____________________________________________   EKG    ____________________________________________    RADIOLOGY  No results found.  ____________________________________________   PROCEDURES Procedures  ____________________________________________    CLINICAL IMPRESSION / ASSESSMENT AND PLAN / ED COURSE  Medications ordered in the ED: Medications  sodium chloride  flush (NS) 0.9 % injection 3 mL (has no administration in time range)  dicyclomine (BENTYL) injection 20 mg (has no administration in time range)  haloperidol lactate (HALDOL) injection 2.5 mg (2.5 mg Intravenous Given 10/29/19 1409)  sodium chloride 0.9 % bolus 1,000 mL (1,000 mLs Intravenous New Bag/Given 10/29/19 1355)  insulin aspart (novoLOG) injection 5 Units (5 Units Intravenous Given 10/29/19 1408)    Pertinent labs & imaging results that were available during my care of the patient were reviewed by me and considered in my medical decision making (see chart for details).  Trevor Brown was evaluated in Emergency Department on 10/29/2019 for the symptoms described in the history of present illness. He was evaluated in the context of the global COVID-19 pandemic, which necessitated consideration that the patient might be at risk for infection with the SARS-CoV-2 virus that causes COVID-19. Institutional protocols and algorithms that pertain to the evaluation of patients at risk for COVID-19 are in a state of rapid change based on information released by regulatory bodies including the CDC and federal and state organizations. These policies and algorithms were followed during the patient's care in the ED.   Patient presents with hyperglycemia in the setting of type 1 diabetes.  Not significantly dehydrated on exam, vital signs are normal.  Blood sugar is about 300 on labs which do not show any evidence of acidosis or DKA.  Patient is nontoxic and stable for outpatient follow-up.  I will give his sliding scale dose of aspart insulin, IV fluids for hydration, low-dose of Haldol and Bentyl for nausea.  Clinically I think he has gastritis may be from GERD and I will write him a prescription for famotidine.  Patient is requesting Dilaudid for his stomach pain, but with his history of polysubstance abuse, particularly heroin abuse, I do not think it is in his best interest to treat him with opioids.       ____________________________________________   FINAL CLINICAL IMPRESSION(S) / ED DIAGNOSES    Final diagnoses:  Type 1 diabetes mellitus with hyperglycemia [facility])     ED Discharge Orders         Ordered    famotidine (PEPCID) 20 MG tablet  2 times daily  10/29/19 1417    ondansetron (ZOFRAN ODT) 4 MG disintegrating tablet  Every 8 hours PRN     10/29/19 1417          Portions of this note were generated with dragon dictation software. Dictation errors may occur despite best attempts at proofreading.   Carrie Mew, MD 10/29/19 1423

## 2019-10-29 NOTE — ED Notes (Signed)
Pt informed ED tech Jeb that that he was not feeling well. This RN went to check on pt. Pt requesting to know how much longer before he is seen. RN informed pt that we are unable to tell him how long it will be before he is seen. Pt asked if he could go outside. Pt informed that he cannot go outside as he has an IV at this time. Pt again asked how long it would be before he was seen. Pt informed that his lab test are back and we are just waiting on a bed for him to see the doctor. Pt informed that he is the longest wait. Pt asked RN "so I can just go and drink some water and I will be fine". Pt informed that he had to see the MD to get his results and I could not give them to him. Pt stated that he was probably going to leave. Pt was informed by this RN to let us know if he decides to leave and we will remove his IV. Pt got on cell phone and called someone stating that he was wanting to leave.

## 2019-10-29 NOTE — ED Notes (Signed)
Pharmacy messaged to send bentyl

## 2019-10-29 NOTE — ED Notes (Signed)
CBG 306 

## 2019-10-29 NOTE — Discharge Instructions (Addendum)
Be sure to take all of your medications, including the short acting and long acting insulin, every day.

## 2019-10-29 NOTE — ED Notes (Signed)
See triage note, pt c/o n/v for the past couple days. C/o lower abdominal tenderness.  Family at bedside

## 2019-10-29 NOTE — ED Triage Notes (Signed)
Pt to ED via POV c/o being in DKA. Pt states that he has been vomiting x 3 days and is unable to keep anything down. Pt states that his CBG was 524, pt states that he took 12 units of insulin this morning around 0930. Pt states that he has not been missing any doses of medication. Pt is in NAD at this time. VSS.

## 2019-11-04 LAB — GLUCOSE, CAPILLARY: Glucose-Capillary: 306 mg/dL — ABNORMAL HIGH (ref 70–99)

## 2019-12-08 ENCOUNTER — Other Ambulatory Visit: Payer: Self-pay | Admitting: Gerontology

## 2019-12-08 DIAGNOSIS — E1065 Type 1 diabetes mellitus with hyperglycemia: Secondary | ICD-10-CM

## 2019-12-08 DIAGNOSIS — G629 Polyneuropathy, unspecified: Secondary | ICD-10-CM

## 2019-12-24 ENCOUNTER — Telehealth: Payer: Self-pay | Admitting: Pharmacist

## 2019-12-24 NOTE — Telephone Encounter (Signed)
12/24/2019 9:20:36 AM - Lantus refill faxed to [facility]  -- Rhetta Mura - Friday, December 24, 2019 9:20 AM --Arneta Cliche [facility] refill for Lantus Vials Inject 25 units once daily #3.

## 2020-01-11 ENCOUNTER — Other Ambulatory Visit: Payer: Self-pay | Admitting: Gerontology

## 2020-01-11 DIAGNOSIS — E1065 Type 1 diabetes mellitus with hyperglycemia: Secondary | ICD-10-CM

## 2020-01-11 DIAGNOSIS — G629 Polyneuropathy, unspecified: Secondary | ICD-10-CM

## 2020-01-18 ENCOUNTER — Encounter: Payer: Self-pay | Admitting: Gerontology

## 2020-01-18 ENCOUNTER — Ambulatory Visit: Payer: Medicaid Other | Admitting: Gerontology

## 2020-01-18 ENCOUNTER — Other Ambulatory Visit: Payer: Self-pay

## 2020-01-18 VITALS — BP 138/94 | HR 60 | Ht 65.0 in | Wt 111.6 lb

## 2020-01-18 DIAGNOSIS — G629 Polyneuropathy, unspecified: Secondary | ICD-10-CM

## 2020-01-18 DIAGNOSIS — E1065 Type 1 diabetes mellitus with hyperglycemia: Secondary | ICD-10-CM

## 2020-01-18 DIAGNOSIS — R030 Elevated blood-pressure reading, without diagnosis of hypertension: Secondary | ICD-10-CM

## 2020-01-18 MED ORDER — GABAPENTIN 400 MG PO CAPS: 400.0000 mg | ORAL_CAPSULE | Freq: Every day | ORAL | 2 refills | Status: DC

## 2020-01-18 NOTE — Progress Notes (Signed)
Established Patient Office Visit  Subjective:  Patient ID: Trevor Brown, male    DOB: 27-Dec-1990  Age: 29 y.o. MRN: [id number]  CC:  Chief Complaint  Patient presents with  . Diabetes    HPI Trevor Brown presents for follow up of type 1 diabetes mellitus. He states that he checks blood glucose tid, and it was in the 140's yesterday when it was checked. He reports that he didn't check his fasting blood glucose because his glucometer, and lancet was missing, likewise didn't take his morning Novolog Aspart. His Last HgbA1c done on 09/12/2019 was 9.3%, his blood glucose was 418 mg/dl when checked during his office visit. He denies hypoglycemic symptoms, but admits to polyphagia, polyuria and polydipsia. He also states that his peripheral neuropathy is not controlled with taking 200 mg of gabapentin, that he has been taking 400 mg at bedtime. He states that he continues to work on his diet, performs daily foot check , denied sores/wound. His blood pressure was elevated during visit, he does not check blood pressure at home, continues to smoke 2 packs of cigarette daily and denies the desire to quit. Overall, he states that he's doing well and offers no further complaint.           Past Medical History:  Diagnosis Date  . Heart attack (HCC)    Pt claims he had heart attack a year ago ( 2015)- and was admitted in [facility] for that, but not given any meds or angiogram, on review of chart- I could not find any details like that.  . Hepatitis C, acute may 2016  . Heroin abuse (HCC)   . History of noncompliance with medical treatment   . Hypertension   . Type 1 diabetes [facility])     Past Surgical History:  Procedure Laterality Date  . NO PAST SURGERIES    . none      Family History  Problem Relation Age of Onset  . Cirrhosis Mother   . Diabetes Mellitus II Maternal Grandmother   . Diabetes Mellitus II Maternal Grandfather   . CAD Father     Social History   Socioeconomic History   . Marital status: Single    Spouse name: Not on file  . Number of children: Not on file  . Years of education: Not on file  . Highest education level: Not on file  Occupational History  . Occupation: tree cutter  Tobacco Use  . Smoking status: Current Every Day Smoker    Packs/day: 2.00    Types: Cigarettes  . Smokeless tobacco: Never Used  Vaping Use  . Vaping Use: Never used  Substance and Sexual Activity  . Alcohol use: No    Alcohol/week: 0.0 standard drinks  . Drug use: Not Currently    Types: IV, Heroin, Marijuana    Comment: heroin- last used about 1 week ago  . Sexual activity: Yes  Other Topics Concern  . Not on file  Social History Narrative  . Not on file   Social Determinants of Health   Financial Resource Strain: High Risk  . Difficulty of Paying Living Expenses: Hard  Food Insecurity: No Food Insecurity  . Worried About Programme researcher, broadcasting/film/video in the Last Year: Never true  . Ran Out of Food in the Last Year: Never true  Transportation Needs: No Transportation Needs  . Lack of Transportation (Medical): No  . Lack of Transportation (Non-Medical): No  Physical Activity: Sufficiently Active  . Days of  Exercise per Week: 7 days  . Minutes of Exercise per Session: 60 min  Stress:   . Feeling of Stress :   Social Connections: Moderately Isolated  . Frequency of Communication with Friends and Family: More than three times a week  . Frequency of Social Gatherings with Friends and Family: More than three times a week  . Attends Religious Services: Never  . Active Member of Clubs or Organizations: No  . Attends BankerClub or Organization Meetings: Never  . Marital Status: Living with partner  Intimate Partner Violence: Not At Risk  . Fear of Current or Ex-Partner: No  . Emotionally Abused: No  . Physically Abused: No  . Sexually Abused: No    Outpatient Medications Prior to Visit  Medication Sig Dispense Refill  . glucose blood (FREESTYLE LITE) test strip To be  used as directed for checking her blood sugar 60 each 5  . insulin aspart (NOVOLOG) 100 UNIT/ML injection Inject 5-10 Units into the skin See admin instructions. INJECT BEFORE MEALS PER SLIDING SCALE AS DIRECTED: 80-250 = 5 UNITS;251-350 = 8 UNITS; OVER 350 = 10 UNITS 50 mL 2  . ondansetron (ZOFRAN ODT) 4 MG disintegrating tablet Take 1 tablet (4 mg total) by mouth every 8 (eight) hours as needed for nausea or vomiting. 20 tablet 0  . gabapentin (NEURONTIN) 100 MG capsule Take 2 capsules (200 mg total) by mouth at bedtime. 60 capsule 3  . Insulin Glargine (BASAGLAR KWIKPEN) 100 UNIT/ML Inject 0.25 mLs (25 Units total) into the skin daily. 7.5 mL 0  . famotidine (PEPCID) 20 MG tablet Take 1 tablet (20 mg total) by mouth 2 (two) times daily. (Patient not taking: Reported on 01/18/2020) 60 tablet 0  . pantoprazole (PROTONIX) 40 MG tablet Take 1 tablet (40 mg total) by mouth daily. (Patient not taking: Reported on 01/18/2020) 30 tablet 0   No facility-administered medications prior to visit.    Allergies  Allergen Reactions  . Brown Venom Anaphylaxis  . Penicillins Anaphylaxis, Hives and Other (See Comments)    Has patient had a PCN reaction causing immediate rash, facial/tongue/throat swelling, SOB or lightheadedness with hypotension: Yes Has patient had a PCN reaction causing severe rash involving mucus membranes or skin necrosis: No Has patient had a PCN reaction that required hospitalization No Has patient had a PCN reaction occurring within the last 10 years: Yes If all of the above answers are "NO", then may proceed with Cephalosporin use.  . Ibuprofen Itching, Rash and Other (See Comments)    Other reaction(s): Other (See Comments) Stomach upset Reaction:  GI upset   . Tramadol Hives and Swelling  . Vancomycin Rash and Other (See Comments)    Reaction:  Red man's syndrome     ROS Review of Systems  Constitutional: Negative.   Eyes: Negative.   Respiratory: Negative.   Endocrine:  Negative.   Neurological: Positive for numbness (peripheral neuropathy).  Psychiatric/Behavioral: Negative.       Objective:    Physical Exam Constitutional:      Appearance: Normal appearance.  HENT:     Head: Normocephalic and atraumatic.     Mouth/Throat:     Mouth: Mucous membranes are moist.  Eyes:     Extraocular Movements: Extraocular movements intact.     Pupils: Pupils are equal, round, and reactive to light.  Cardiovascular:     Rate and Rhythm: Normal rate and regular rhythm.     Pulses: Normal pulses.     Heart sounds: Normal heart  sounds.  Pulmonary:     Effort: Pulmonary effort is normal.     Breath sounds: Normal breath sounds.  Skin:    General: Skin is warm and dry.  Neurological:     General: No focal deficit present.     Mental Status: He is alert and oriented to person, place, and time. Mental status is at baseline.     BP (!) 138/94 (BP Location: Left Arm, Patient Position: Sitting, Cuff Size: Normal)   Pulse 60   Ht 5\' 5"  (1.651 m)   Wt 111 lb 9.6 oz (50.6 kg)   SpO2 100%   BMI 18.57 kg/m  Wt Readings from Last 3 Encounters:  01/18/20 111 lb 9.6 oz (50.6 kg)  10/29/19 120 lb (54.4 kg)  10/28/19 120 lb (54.4 kg)   He lost 9 pounds in 10 weeks and was encouraged to increase his caloric intake.  Health Maintenance Due  Topic Date Due  . OPHTHALMOLOGY EXAM  Never done  . COVID-19 Vaccine (1) Never done  . TETANUS/TDAP  Never done  . URINE MICROALBUMIN  03/02/2020    There are no preventive care reminders to display for this patient.  Lab Results  Component Value Date   TSH 1.170 03/03/2019   Lab Results  Component Value Date   WBC 8.1 10/29/2019   HGB 13.9 10/29/2019   HCT 41.6 10/29/2019   MCV 88.9 10/29/2019   PLT 200 10/29/2019   Lab Results  Component Value Date   NA 132 (L) 10/29/2019   K 4.3 10/29/2019   CO2 27 10/29/2019   GLUCOSE 295 (H) 10/29/2019   BUN 26 (H) 10/29/2019   CREATININE 0.93 10/29/2019   BILITOT 1.2  10/29/2019   ALKPHOS 87 10/29/2019   AST 63 (H) 10/29/2019   ALT 69 (H) 10/29/2019   PROT 7.6 10/29/2019   ALBUMIN 4.5 10/29/2019   CALCIUM 9.4 10/29/2019   ANIONGAP 10 10/29/2019   Lab Results  Component Value Date   CHOL 90 (L) 03/03/2019   Lab Results  Component Value Date   HDL 29 (L) 03/03/2019   Lab Results  Component Value Date   LDLCALC 49 03/03/2019   Lab Results  Component Value Date   TRIG 60 03/03/2019   Lab Results  Component Value Date   CHOLHDL 3.1 03/03/2019   Lab Results  Component Value Date   HGBA1C 9.3 (H) 09/12/2019      Assessment & Plan:     1. Peripheral polyneuropathy - His neuropathy is not under control, his gabapentin was increased to 400 mg at bedtime. He was encouraged not to walk bare footed, perform daily foot checks and tighter glycemic control. - gabapentin (NEURONTIN) 400 MG capsule; Take 1 capsule (400 mg total) by mouth at bedtime.  Dispense: 30 capsule; Refill: 2  2. Type 1 diabetes mellitus with hyperglycemia (HCC) -His Type 1 diabetes is not under control, his HgbA1c was 9.3% and his goal should be less than 6%. He was provided with Glucometer set and was advised to check blood glucose tid, record and call clinic in 2 days with reading. He was encouraged to continue on low carb/non concentrated sweet diet. He will continue on his insulin regimen. And follow up with [facility]. - gabapentin (NEURONTIN) 400 MG capsule; Take 1 capsule (400 mg total) by mouth at bedtime.  Dispense: 30 capsule; Refill: 2 - Urine Microalbumin w/creat. ratio; Future - HgB A1c; Future  3. Elevated blood pressure reading - His blood pressure was  elevated during visit and was 138/94. - He was encouraged to continue on Low salt DASH diet -Check blood pressure at least once a week at home or a nearby pharmacy, record and bring log to follow up appointment. -Goal is less than 140/90 and normal blood pressure is 120/80 - He was advised on smoking  cessation.     Follow-up: Return in about 23 days (around 02/10/2020), or if symptoms worsen or fail to improve.    Landen Breeland Trellis Paganini, NP

## 2020-01-18 NOTE — Patient Instructions (Signed)
Carbohydrate Counting for Diabetes Mellitus, Adult  Carbohydrate counting is a method of keeping track of how many carbohydrates you eat. Eating carbohydrates naturally increases the amount of sugar (glucose) in the blood. Counting how many carbohydrates you eat helps keep your blood glucose within normal limits, which helps you manage your diabetes (diabetes mellitus). It is important to know how many carbohydrates you can safely have in each meal. This is different for every person. A diet and nutrition specialist (registered dietitian) can help you make a meal plan and calculate how many carbohydrates you should have at each meal and snack. Carbohydrates are found in the following foods:  Grains, such as breads and cereals.  Dried beans and soy products.  Starchy vegetables, such as potatoes, peas, and corn.  Fruit and fruit juices.  Milk and yogurt.  Sweets and snack foods, such as cake, cookies, candy, chips, and soft drinks. How do I count carbohydrates? There are two ways to count carbohydrates in food. You can use either of the methods or a combination of both. Reading "Nutrition Facts" on packaged food The "Nutrition Facts" list is included on the labels of almost all packaged foods and beverages in the [place] It includes:  The serving size.  Information about nutrients in each serving, including the grams (g) of carbohydrate per serving. To use the "Nutrition Facts":  Decide how many servings you will have.  Multiply the number of servings by the number of carbohydrates per serving.  The resulting number is the total amount of carbohydrates that you will be having. Learning standard serving sizes of other foods When you eat carbohydrate foods that are not packaged or do not include "Nutrition Facts" on the label, you need to measure the servings in order to count the amount of carbohydrates:  Measure the foods that you will eat with a food scale or measuring cup, if  needed.  Decide how many standard-size servings you will eat.  Multiply the number of servings by 15. Most carbohydrate-rich foods have about 15 g of carbohydrates per serving. ? For example, if you eat 8 oz (170 g) of strawberries, you will have eaten 2 servings and 30 g of carbohydrates (2 servings x 15 g = 30 g).  For foods that have more than one food mixed, such as soups and casseroles, you must count the carbohydrates in each food that is included. The following list contains standard serving sizes of common carbohydrate-rich foods. Each of these servings has about 15 g of carbohydrates:   hamburger bun or  English muffin.   oz (15 mL) syrup.   oz (14 g) jelly.  1 slice of bread.  1 six-inch tortilla.  3 oz (85 g) cooked rice or pasta.  4 oz (113 g) cooked dried beans.  4 oz (113 g) starchy vegetable, such as peas, corn, or potatoes.  4 oz (113 g) hot cereal.  4 oz (113 g) mashed potatoes or  of a large baked potato.  4 oz (113 g) canned or frozen fruit.  4 oz (120 mL) fruit juice.  4-6 crackers.  6 chicken nuggets.  6 oz (170 g) unsweetened dry cereal.  6 oz (170 g) plain fat-free yogurt or yogurt sweetened with artificial sweeteners.  8 oz (240 mL) milk.  8 oz (170 g) fresh fruit or one small piece of fruit.  24 oz (680 g) popped popcorn. Example of carbohydrate counting Sample meal  3 oz (85 g) chicken breast.  6 oz (170 g)   brown rice.  4 oz (113 g) corn.  8 oz (240 mL) milk.  8 oz (170 g) strawberries with sugar-free whipped topping. Carbohydrate calculation 1. Identify the foods that contain carbohydrates: ? Rice. ? Corn. ? Milk. ? Strawberries. 2. Calculate how many servings you have of each food: ? 2 servings rice. ? 1 serving corn. ? 1 serving milk. ? 1 serving strawberries. 3. Multiply each number of servings by 15 g: ? 2 servings rice x 15 g = 30 g. ? 1 serving corn x 15 g = 15 g. ? 1 serving milk x 15 g = 15 g. ? 1  serving strawberries x 15 g = 15 g. 4. Add together all of the amounts to find the total grams of carbohydrates eaten: ? 30 g + 15 g + 15 g + 15 g = 75 g of carbohydrates total. Summary  Carbohydrate counting is a method of keeping track of how many carbohydrates you eat.  Eating carbohydrates naturally increases the amount of sugar (glucose) in the blood.  Counting how many carbohydrates you eat helps keep your blood glucose within normal limits, which helps you manage your diabetes.  A diet and nutrition specialist (registered dietitian) can help you make a meal plan and calculate how many carbohydrates you should have at each meal and snack. This information is not intended to replace advice given to you by your health care provider. Make sure you discuss any questions you have with your health care provider. Document Revised: 01/30/2017 Document Reviewed: 12/20/2015 Elsevier Patient Education  2020 Elsevier Inc.  

## 2020-02-03 ENCOUNTER — Other Ambulatory Visit: Payer: Medicaid Other

## 2020-02-10 ENCOUNTER — Ambulatory Visit: Payer: Medicaid Other

## 2020-02-14 ENCOUNTER — Telehealth: Payer: Self-pay | Admitting: Pharmacist

## 2020-02-14 NOTE — Telephone Encounter (Signed)
02/14/2020 2:32:38 PM - Lantus Vials refill to [facility]  -- Rhetta Mura - Monday, February 14, 2020 2:31 PM --Sending [employer] refill request to [facility] for Lantus Vials Inject 25 units under the skin once daily # 4 vials.

## 2020-02-17 ENCOUNTER — Other Ambulatory Visit: Payer: Self-pay | Admitting: Gerontology

## 2020-02-22 ENCOUNTER — Other Ambulatory Visit: Payer: Self-pay

## 2020-02-22 MED ORDER — INSULIN LISPRO 100 UNIT/ML ~~LOC~~ SOLN
SUBCUTANEOUS | 11 refills | Status: DC
Start: 2020-02-22 — End: 2020-02-22

## 2020-02-22 NOTE — Progress Notes (Signed)
Switched from [employer] to [employer] as easier for pharmacy to get from [facility]

## 2020-02-29 ENCOUNTER — Other Ambulatory Visit: Payer: Self-pay | Admitting: Gerontology

## 2020-03-01 ENCOUNTER — Telehealth: Payer: Self-pay | Admitting: Pharmacist

## 2020-03-01 NOTE — Telephone Encounter (Signed)
03/01/2020 10:11:32 AM - Faxed updated [employer] refill for Lantus Vials  -- Rhetta Mura - Wednesday, March 01, 2020 10:10 AM --Arneta Cliche updated [employer] refill request for Lantus Vials Inject 25 units under the skin once daily #4 vials.

## 2020-03-03 ENCOUNTER — Emergency Department: Payer: Self-pay

## 2020-03-03 ENCOUNTER — Encounter: Payer: Self-pay | Admitting: Emergency Medicine

## 2020-03-03 ENCOUNTER — Inpatient Hospital Stay
Admission: EM | Admit: 2020-03-03 | Discharge: 2020-03-08 | DRG: 854 | Disposition: A | Discharge status: Home / Self Care | Payer: Self-pay | Attending: Hospitalist | Admitting: Hospitalist

## 2020-03-03 ENCOUNTER — Other Ambulatory Visit: Payer: Self-pay

## 2020-03-03 ENCOUNTER — Inpatient Hospital Stay: Payer: Self-pay

## 2020-03-03 DIAGNOSIS — L02414 Cutaneous abscess of left upper limb: Secondary | ICD-10-CM | POA: Diagnosis present

## 2020-03-03 DIAGNOSIS — F1910 Other psychoactive substance abuse, uncomplicated: Secondary | ICD-10-CM | POA: Diagnosis present

## 2020-03-03 DIAGNOSIS — A419 Sepsis, unspecified organism: Secondary | ICD-10-CM

## 2020-03-03 DIAGNOSIS — N179 Acute kidney failure, unspecified: Secondary | ICD-10-CM | POA: Diagnosis present

## 2020-03-03 DIAGNOSIS — Z794 Long term (current) use of insulin: Secondary | ICD-10-CM

## 2020-03-03 DIAGNOSIS — Z6822 Body mass index (BMI) 22.0-22.9, adult: Secondary | ICD-10-CM

## 2020-03-03 DIAGNOSIS — Z91030 Bee allergy status: Secondary | ICD-10-CM

## 2020-03-03 DIAGNOSIS — Z886 Allergy status to analgesic agent status: Secondary | ICD-10-CM

## 2020-03-03 DIAGNOSIS — F1110 Opioid abuse, uncomplicated: Secondary | ICD-10-CM | POA: Diagnosis present

## 2020-03-03 DIAGNOSIS — Z5900 Homelessness unspecified: Secondary | ICD-10-CM

## 2020-03-03 DIAGNOSIS — Z22322 Carrier or suspected carrier of Methicillin resistant Staphylococcus aureus: Secondary | ICD-10-CM

## 2020-03-03 DIAGNOSIS — Z885 Allergy status to narcotic agent status: Secondary | ICD-10-CM

## 2020-03-03 DIAGNOSIS — R6510 Systemic inflammatory response syndrome (SIRS) of non-infectious origin without acute organ dysfunction: Secondary | ICD-10-CM | POA: Diagnosis present

## 2020-03-03 DIAGNOSIS — Z880 Allergy status to penicillin: Secondary | ICD-10-CM

## 2020-03-03 DIAGNOSIS — E871 Hypo-osmolality and hyponatremia: Secondary | ICD-10-CM | POA: Diagnosis present

## 2020-03-03 DIAGNOSIS — Z9114 Patient's other noncompliance with medication regimen: Secondary | ICD-10-CM

## 2020-03-03 DIAGNOSIS — Z833 Family history of diabetes mellitus: Secondary | ICD-10-CM

## 2020-03-03 DIAGNOSIS — I10 Essential (primary) hypertension: Secondary | ICD-10-CM | POA: Diagnosis present

## 2020-03-03 DIAGNOSIS — Z8249 Family history of ischemic heart disease and other diseases of the circulatory system: Secondary | ICD-10-CM

## 2020-03-03 DIAGNOSIS — E1010 Type 1 diabetes mellitus with ketoacidosis without coma: Secondary | ICD-10-CM

## 2020-03-03 DIAGNOSIS — F17210 Nicotine dependence, cigarettes, uncomplicated: Secondary | ICD-10-CM | POA: Diagnosis present

## 2020-03-03 DIAGNOSIS — L03114 Cellulitis of left upper limb: Secondary | ICD-10-CM | POA: Diagnosis present

## 2020-03-03 DIAGNOSIS — M659 Synovitis and tenosynovitis, unspecified: Secondary | ICD-10-CM | POA: Diagnosis present

## 2020-03-03 DIAGNOSIS — B1920 Unspecified viral hepatitis C without hepatic coma: Secondary | ICD-10-CM | POA: Diagnosis present

## 2020-03-03 DIAGNOSIS — E861 Hypovolemia: Secondary | ICD-10-CM | POA: Diagnosis present

## 2020-03-03 DIAGNOSIS — Z20822 Contact with and (suspected) exposure to covid-19: Secondary | ICD-10-CM | POA: Diagnosis present

## 2020-03-03 DIAGNOSIS — A408 Other streptococcal sepsis: Principal | ICD-10-CM | POA: Diagnosis present

## 2020-03-03 DIAGNOSIS — E440 Moderate protein-calorie malnutrition: Secondary | ICD-10-CM | POA: Diagnosis present

## 2020-03-03 DIAGNOSIS — Z881 Allergy status to other antibiotic agents status: Secondary | ICD-10-CM

## 2020-03-03 DIAGNOSIS — E1065 Type 1 diabetes mellitus with hyperglycemia: Secondary | ICD-10-CM | POA: Diagnosis present

## 2020-03-03 DIAGNOSIS — T383X6A Underdosing of insulin and oral hypoglycemic [antidiabetic] drugs, initial encounter: Secondary | ICD-10-CM | POA: Diagnosis present

## 2020-03-03 DIAGNOSIS — IMO0002 Reserved for concepts with insufficient information to code with codable children: Secondary | ICD-10-CM | POA: Diagnosis present

## 2020-03-03 DIAGNOSIS — Z79899 Other long term (current) drug therapy: Secondary | ICD-10-CM

## 2020-03-03 DIAGNOSIS — R64 Cachexia: Secondary | ICD-10-CM | POA: Diagnosis present

## 2020-03-03 DIAGNOSIS — I252 Old myocardial infarction: Secondary | ICD-10-CM

## 2020-03-03 DIAGNOSIS — F1410 Cocaine abuse, uncomplicated: Secondary | ICD-10-CM | POA: Diagnosis present

## 2020-03-03 LAB — CBC
HCT: 31.2 % — ABNORMAL LOW (ref 39.0–52.0)
Hemoglobin: 10.3 g/dL — ABNORMAL LOW (ref 13.0–17.0)
MCH: 28.5 pg (ref 26.0–34.0)
MCHC: 33 g/dL (ref 30.0–36.0)
MCV: 86.2 fL (ref 80.0–100.0)
Platelets: 251 10*3/uL (ref 150–400)
RBC: 3.62 MIL/uL — ABNORMAL LOW (ref 4.22–5.81)
RDW: 14.1 % (ref 11.5–15.5)
WBC: 17.4 10*3/uL — ABNORMAL HIGH (ref 4.0–10.5)
nRBC: 0 % (ref 0.0–0.2)

## 2020-03-03 LAB — BETA-HYDROXYBUTYRIC ACID: Beta-Hydroxybutyric Acid: 0.38 mmol/L — ABNORMAL HIGH (ref 0.05–0.27)

## 2020-03-03 LAB — GLUCOSE, CAPILLARY
Glucose-Capillary: 230 mg/dL — ABNORMAL HIGH (ref 70–99)
Glucose-Capillary: 238 mg/dL — ABNORMAL HIGH (ref 70–99)
Glucose-Capillary: 272 mg/dL — ABNORMAL HIGH (ref 70–99)
Glucose-Capillary: 387 mg/dL — ABNORMAL HIGH (ref 70–99)
Glucose-Capillary: 427 mg/dL — ABNORMAL HIGH (ref 70–99)
Glucose-Capillary: >600 mg/dL (ref 70–99)
Glucose-Capillary: >600 mg/dL (ref 70–99)
Glucose-Capillary: >600 mg/dL (ref 70–99)

## 2020-03-03 LAB — COMPREHENSIVE METABOLIC PANEL
ALT: 48 U/L — ABNORMAL HIGH (ref 0–44)
AST: 36 U/L (ref 15–41)
Albumin: 3.8 g/dL (ref 3.5–5.0)
Alkaline Phosphatase: 120 U/L (ref 38–126)
Anion gap: 11 (ref 5–15)
BUN: 27 mg/dL — ABNORMAL HIGH (ref 6–20)
CO2: 23 mmol/L (ref 22–32)
Calcium: 8.5 mg/dL — ABNORMAL LOW (ref 8.9–10.3)
Chloride: 91 mmol/L — ABNORMAL LOW (ref 98–111)
Creatinine, Ser: 1.52 mg/dL — ABNORMAL HIGH (ref 0.61–1.24)
GFR calc Af Amer: >60 mL/min (ref >60)
GFR calc non Af Amer: >60 mL/min (ref >60)
Glucose, Bld: 871 mg/dL (ref 70–99)
Potassium: 5.3 mmol/L — ABNORMAL HIGH (ref 3.5–5.1)
Sodium: 125 mmol/L — ABNORMAL LOW (ref 135–145)
Total Bilirubin: 1.1 mg/dL (ref 0.3–1.2)
Total Protein: 7.6 g/dL (ref 6.5–8.1)

## 2020-03-03 LAB — URINALYSIS, ROUTINE W REFLEX MICROSCOPIC
Bacteria, UA: NONE SEEN
Bilirubin Urine: NEGATIVE
Glucose, UA: >=500 mg/dL — AB
Ketones, ur: NEGATIVE mg/dL
Leukocytes,Ua: NEGATIVE
Nitrite: NEGATIVE
Protein, ur: NEGATIVE mg/dL
Specific Gravity, Urine: 1.028 (ref 1.005–1.030)
pH: 5 (ref 5.0–8.0)

## 2020-03-03 LAB — SARS CORONAVIRUS 2 BY RT PCR (HOSPITAL ORDER, PERFORMED IN ~~LOC~~ HOSPITAL LAB): SARS Coronavirus 2: NEGATIVE

## 2020-03-03 LAB — BLOOD GAS, VENOUS
Acid-Base Excess: 0.7 mmol/L (ref 0.0–2.0)
Bicarbonate: 27.7 mmol/L (ref 20.0–28.0)
O2 Saturation: 48 %
Patient temperature: 37
pCO2, Ven: 55 mmHg (ref 44.0–60.0)
pH, Ven: 7.31 (ref 7.250–7.430)

## 2020-03-03 LAB — PROTIME-INR
INR: 1 (ref 0.8–1.2)
Prothrombin Time: 13 seconds (ref 11.4–15.2)

## 2020-03-03 LAB — CBC WITH DIFFERENTIAL/PLATELET
Abs Immature Granulocytes: 0.08 10*3/uL — ABNORMAL HIGH (ref 0.00–0.07)
Basophils Absolute: 0.1 10*3/uL (ref 0.0–0.1)
Basophils Relative: 0 %
Eosinophils Absolute: 0.1 10*3/uL (ref 0.0–0.5)
Eosinophils Relative: 1 %
HCT: 34.7 % — ABNORMAL LOW (ref 39.0–52.0)
Hemoglobin: 11.1 g/dL — ABNORMAL LOW (ref 13.0–17.0)
Immature Granulocytes: 1 %
Lymphocytes Relative: 14 %
Lymphs Abs: 2.3 10*3/uL (ref 0.7–4.0)
MCH: 28.2 pg (ref 26.0–34.0)
MCHC: 32 g/dL (ref 30.0–36.0)
MCV: 88.3 fL (ref 80.0–100.0)
Monocytes Absolute: 1.2 10*3/uL — ABNORMAL HIGH (ref 0.1–1.0)
Monocytes Relative: 7 %
Neutro Abs: 13.5 10*3/uL — ABNORMAL HIGH (ref 1.7–7.7)
Neutrophils Relative %: 77 %
Platelets: 310 10*3/uL (ref 150–400)
RBC: 3.93 MIL/uL — ABNORMAL LOW (ref 4.22–5.81)
RDW: 14.2 % (ref 11.5–15.5)
WBC: 17.3 10*3/uL — ABNORMAL HIGH (ref 4.0–10.5)
nRBC: 0 % (ref 0.0–0.2)

## 2020-03-03 LAB — BASIC METABOLIC PANEL
Anion gap: 9 (ref 5–15)
BUN: 23 mg/dL — ABNORMAL HIGH (ref 6–20)
CO2: 28 mmol/L (ref 22–32)
Calcium: 8.5 mg/dL — ABNORMAL LOW (ref 8.9–10.3)
Chloride: 96 mmol/L — ABNORMAL LOW (ref 98–111)
Creatinine, Ser: 1.07 mg/dL (ref 0.61–1.24)
GFR calc Af Amer: >60 mL/min (ref >60)
GFR calc non Af Amer: >60 mL/min (ref >60)
Glucose, Bld: 442 mg/dL — ABNORMAL HIGH (ref 70–99)
Potassium: 3.7 mmol/L (ref 3.5–5.1)
Sodium: 133 mmol/L — ABNORMAL LOW (ref 135–145)

## 2020-03-03 LAB — LACTIC ACID, PLASMA
Lactic Acid, Venous: 1.1 mmol/L (ref 0.5–1.9)
Lactic Acid, Venous: 1.1 mmol/L (ref 0.5–1.9)

## 2020-03-03 LAB — MAGNESIUM: Magnesium: 1.9 mg/dL (ref 1.7–2.4)

## 2020-03-03 MED ORDER — DEXTROSE 50 % IV SOLN: 0.0000 mL | INTRAVENOUS | Status: DC | PRN

## 2020-03-03 MED ORDER — PROMETHAZINE HCL 25 MG PO TABS: 12.5000 mg | ORAL_TABLET | Freq: Four times a day (QID) | ORAL | Status: DC | PRN

## 2020-03-03 MED ORDER — LACTATED RINGERS IV BOLUS (SEPSIS)
1000.0000 mL | Freq: Once | INTRAVENOUS | Status: AC
  Administered 2020-03-03: 1000 mL via INTRAVENOUS

## 2020-03-03 MED ORDER — OXYCODONE HCL 5 MG PO TABS
5.0000 mg | ORAL_TABLET | ORAL | Status: DC | PRN
  Administered 2020-03-03 – 2020-03-04 (×5): 10 mg via ORAL
  Filled 2020-03-03 (×5): qty 2

## 2020-03-03 MED ORDER — DEXTROSE IN LACTATED RINGERS 5 % IV SOLN: INTRAVENOUS | Status: DC

## 2020-03-03 MED ORDER — IOHEXOL 300 MG/ML  SOLN
100.0000 mL | Freq: Once | INTRAMUSCULAR | Status: AC | PRN
  Administered 2020-03-03: 100 mL via INTRAVENOUS

## 2020-03-03 MED ORDER — LACTATED RINGERS IV SOLN: INTRAVENOUS | Status: DC

## 2020-03-03 MED ORDER — INSULIN REGULAR(HUMAN) IN NACL 100-0.9 UT/100ML-% IV SOLN
INTRAVENOUS | Status: DC
  Administered 2020-03-03: 7 [IU]/h via INTRAVENOUS
  Filled 2020-03-03: qty 100

## 2020-03-03 MED ORDER — LINEZOLID 600 MG/300ML IV SOLN
600.0000 mg | Freq: Two times a day (BID) | INTRAVENOUS | Status: DC
  Administered 2020-03-03 – 2020-03-06 (×6): 600 mg via INTRAVENOUS
  Filled 2020-03-03 (×8): qty 300

## 2020-03-03 MED ORDER — SODIUM CHLORIDE 0.9 % IV SOLN
2.0000 g | Freq: Once | INTRAVENOUS | Status: AC
  Administered 2020-03-03: 2 g via INTRAVENOUS
  Filled 2020-03-03: qty 2

## 2020-03-03 MED ORDER — TRAMADOL HCL 50 MG PO TABS
50.0000 mg | ORAL_TABLET | Freq: Three times a day (TID) | ORAL | Status: DC | PRN
  Filled 2020-03-03: qty 1

## 2020-03-03 MED ORDER — HYDROCODONE-ACETAMINOPHEN 5-325 MG PO TABS
2.0000 | ORAL_TABLET | Freq: Once | ORAL | Status: AC
  Administered 2020-03-03: 2 via ORAL
  Filled 2020-03-03: qty 2

## 2020-03-03 MED ORDER — ENOXAPARIN SODIUM 40 MG/0.4ML ~~LOC~~ SOLN
40.0000 mg | SUBCUTANEOUS | Status: DC
  Filled 2020-03-03 (×4): qty 0.4

## 2020-03-03 NOTE — ED Provider Notes (Signed)
[facility] Emergency Department Provider Note  ____________________________________________   First MD Initiated Contact with Patient 03/03/20 1531     (approximate)  I have reviewed the triage vital signs and the nursing notes.   HISTORY  Chief Complaint Abscess    HPI Trevor Brown is a 29 y.o. male  With PMHx hepatitis, heroin abuse, T1DM with nonadherence, here with left hand pain and redness. Pt reports that approx 3-4 days ago, he began to have redness along the dorsal aspect of hsi left hand. The redness has now spread throughout his hand and is now increasingly pain. It is 10/10, aching, gnawing. He's  Had associated chills, subjective fevers, and nausea. He admits he has not been taking his insulin regularly and his BG has been running "high." No drainage from the wound. He does not believe he had a needle break off in the skin. H/o recurrent cellulitis and DKA with similar presentations.        Past Medical History:  Diagnosis Date  . Heart attack (HCC)    Pt claims he had heart attack a year ago ( 2015)- and was admitted in [facility] for that, but not given any meds or angiogram, on review of chart- I could not find any details like that.  . Hepatitis C, acute may 2016  . Heroin abuse (HCC)   . History of noncompliance with medical treatment   . Hypertension   . Type 1 diabetes [facility])     Patient Active Problem List   Diagnosis Date Noted  . Elevated blood pressure reading 01/18/2020  . Hypokalemia   . Sepsis without acute organ dysfunction (HCC)   . Dental abscess 09/12/2019  . Intractable vomiting   . Hematemesis with nausea   . Cannabis hyperemesis syndrome concurrent with and due to cannabis abuse (HCC)   . Elevated LFTs   . Abnormal laboratory test result 07/20/2019  . Smoking 07/20/2019  . History of hepatitis C 02/25/2019  . Health care maintenance 02/25/2019  . Peripheral neuropathy 02/25/2019  . Hand abscess 10/13/2018  . IVDU  (intravenous drug user) 10/11/2018  . Hyperkalemia 10/11/2018  . Cellulitis and abscess of hand 08/17/2018  . Polysubstance abuse (HCC) 01/20/2018  . Abdominal pain 06/29/2017  . Hyponatremia 06/29/2017  . DKA (diabetic ketoacidosis) (HCC) 06/29/2017  . Dental erosion extending into pulp 12/31/2016  . 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 Elevated transaminase level 03/27/2016  . Diabetes mellitus type 1 (HCC) 03/27/2016  . Tobacco abuse counseling 03/27/2016  . Transaminitis 03/25/2016  . Leukocytosis 01/13/2016  . Cannabinoid hyperemesis syndrome 01/10/2016  . Tobacco abuse 01/10/2016  . Type 1 diabetes mellitus with hyperglycemia (HCC) 12/12/2015  . Cocaine abuse (HCC) 12/12/2015  . DKA (diabetic ketoacidoses) (HCC) 06/10/2015  . Hidradenitis suppurativa of left axilla   . DKA, type 1 (HCC) 06/06/2015  . Malnutrition of moderate degree (HCC) 04/08/2015  . Hepatitis C 12/13/2014  . Diabetes type 1, uncontrolled (HCC) 12/11/2014  . Major depressive disorder, single episode, mild (HCC)   . Major depression, single episode 12/10/2014  . Diabetes mellitus type 1, uncontrolled (HCC) 12/09/2014  . Homelessness 12/09/2014    Past Surgical History:  Procedure Laterality Date  . NO PAST SURGERIES    . none      Prior to Admission medications   Medication Sig Start Date End Date Taking? Authorizing Provider  famotidine (PEPCID) 20 MG tablet Take 20 mg by mouth 2 (two) times daily.   Yes [provider]  gabapentin (NEURONTIN) 400 MG capsule Take 1 capsule (400 mg total) by mouth at bedtime. 01/18/20  Yes Iloabachie, [name], NP  insulin glargine (LANTUS) 100 UNIT/ML injection Inject 25 Units into the skin daily.   Yes [provider]  glucose blood (FREESTYLE LITE) test strip To be used as directed for checking her blood sugar 06/28/19   Iloabachie, [name], NP  HUMALOG KWIKPEN 100 UNIT/ML KwikPen INJECT 5-10 UNITS INTO THE SKIN BEFORE MEALS PER SLIDING SCALE. IF BLOOD SUGAR  IS 120-250=5 UNITS; 251-350=8 UNITS; OVER 350=10 UNITS Patient not taking: Reported on 03/03/2020 02/22/20   Mike Gip, FNP  Insulin Glargine [facility] KWIKPEN) 100 UNIT/ML Inject 0.25 mLs (25 Units total) into the skin daily. 10/28/19 11/27/19  Mike Gip, FNP  ondansetron (ZOFRAN ODT) 4 MG disintegrating tablet Take 1 tablet (4 mg total) by mouth every 8 (eight) hours as needed for nausea or vomiting. Patient not taking: Reported on 03/03/2020 10/29/19   Sharman Cheek, MD    Allergies Bee venom, Penicillins, Ibuprofen, Tramadol, and Vancomycin  Family History  Problem Relation Age of Onset  . Cirrhosis Mother   . Diabetes Mellitus II Maternal Grandmother   . Diabetes Mellitus II Maternal Grandfather   . CAD Father     Social History Social History   Tobacco Use  . Smoking status: Current Every Day Smoker    Packs/day: 2.00    Types: Cigarettes  . Smokeless tobacco: Never Used  Vaping Use  . Vaping Use: Never used  Substance Use Topics  . Alcohol use: No    Alcohol/week: 0.0 standard drinks  . Drug use: Not Currently    Types: IV, Heroin, Marijuana    Comment: heroin-     Review of Systems  Review of Systems  Constitutional: Positive for chills and fatigue. Negative for fever.  HENT: Negative for sore throat.   Respiratory: Negative for shortness of breath.   Cardiovascular: Negative for chest pain.  Gastrointestinal: Negative for abdominal pain.  Genitourinary: Negative for flank pain.  Musculoskeletal: Positive for arthralgias. Negative for neck pain.  Skin: Positive for rash and wound.  Allergic/Immunologic: Negative for immunocompromised state.  Neurological: Negative for weakness and numbness.  Hematological: Does not bruise/bleed easily.  All other systems reviewed and are negative.    ____________________________________________  PHYSICAL EXAM:      VITAL SIGNS: ED Triage Vitals  Enc Vitals Group     BP 03/03/20 1400 (!) 104/57     Pulse Rate  03/03/20 1352 (!) 104     Resp 03/03/20 1352 10     Temp 03/03/20 1352 98.4 F (36.9 C)     Temp Source 03/03/20 1352 Oral     SpO2 03/03/20 1352 94 %     Weight 03/03/20 1355 130 lb (59 kg)     Height 03/03/20 1355 5\' 4"  (1.626 m)     Head Circumference --      Peak Flow --      Pain Score 03/03/20 1354 7     Pain Loc --      Pain Edu? --      Excl. in GC? --      Physical Exam Vitals and nursing note reviewed.  Constitutional:      General: He is not in acute distress.    Appearance: He is well-developed.  HENT:     Head: Normocephalic and atraumatic.     Mouth/Throat:     Mouth: Mucous membranes are dry.  Eyes:  Conjunctiva/sclera: Conjunctivae normal.  Cardiovascular:     Rate and Rhythm: Normal rate and regular rhythm.     Heart sounds: Normal heart sounds. No murmur heard.  No friction rub.  Pulmonary:     Effort: Pulmonary effort is normal. No respiratory distress.     Breath sounds: Normal breath sounds. No wheezing or rales.  Abdominal:     General: There is no distension.     Palpations: Abdomen is soft.     Tenderness: There is no abdominal tenderness.  Musculoskeletal:     Cervical back: Neck supple.  Skin:    General: Skin is warm.     Capillary Refill: Capillary refill takes less than 2 seconds.  Neurological:     Mental Status: He is alert and oriented to person, place, and time.     Motor: No abnormal muscle tone.       ____________________________________________   LABS (all labs ordered are listed, but only abnormal results are displayed)  Labs Reviewed  GLUCOSE, CAPILLARY - Abnormal; Notable for the following components:      Result Value   Glucose-Capillary >600 (*)    All other components within normal limits  COMPREHENSIVE METABOLIC PANEL - Abnormal; Notable for the following components:   Sodium 125 (*)    Potassium 5.3 (*)    Chloride 91 (*)    Glucose, Bld 871 (*)    BUN 27 (*)    Creatinine, Ser 1.52 (*)    Calcium 8.5  (*)    ALT 48 (*)    All other components within normal limits  CBC WITH DIFFERENTIAL/PLATELET - Abnormal; Notable for the following components:   WBC 17.3 (*)    RBC 3.93 (*)    Hemoglobin 11.1 (*)    HCT 34.7 (*)    Neutro Abs 13.5 (*)    Monocytes Absolute 1.2 (*)    Abs Immature Granulocytes 0.08 (*)    All other components within normal limits  BETA-HYDROXYBUTYRIC ACID - Abnormal; Notable for the following components:   Beta-Hydroxybutyric Acid 0.38 (*)    All other components within normal limits  GLUCOSE, CAPILLARY - Abnormal; Notable for the following components:   Glucose-Capillary >600 (*)    All other components within normal limits  URINALYSIS, ROUTINE W REFLEX MICROSCOPIC - Abnormal; Notable for the following components:   Color, Urine YELLOW (*)    APPearance HAZY (*)    Glucose, UA >=500 (*)    Hgb urine dipstick MODERATE (*)    All other components within normal limits  CULTURE, BLOOD (ROUTINE X 2)  CULTURE, BLOOD (ROUTINE X 2)  CULTURE, BLOOD (SINGLE)  LACTIC ACID, PLASMA  PROTIME-INR  BLOOD GAS, VENOUS  LACTIC ACID, PLASMA  CBG MONITORING, ED    ____________________________________________  EKG: None ________________________________________  RADIOLOGY All imaging, including plain films, CT scans, and ultrasounds, independently reviewed by me, and interpretations confirmed via formal radiology reads.  ED MD interpretation:   CXR: Clear  Official radiology report(s): DG Chest 2 View  Result Date: 03/03/2020 CLINICAL DATA:  Sepsis EXAM: CHEST - 2 VIEW COMPARISON:  September 12, 2019 FINDINGS: The cardiomediastinal silhouette is normal in contour. No pleural effusion. No pneumothorax. No acute pleuroparenchymal abnormality. Visualized abdomen is unremarkable. No acute osseous abnormality noted. IMPRESSION: No acute cardiopulmonary abnormality. Electronically Signed   By: Meda KlinefelterStephanie  Peacock MD   On: 03/03/2020 14:21     ____________________________________________  PROCEDURES   Procedure(s) performed (including Critical Care):  .Critical Care Performed by: Shaune PollackIsaacs, Akshara Blumenthal,  MD Authorized by: Shaune Pollack, MD   Critical care provider statement:    Critical care time (minutes):  35   Critical care time was exclusive of:  Separately billable procedures and treating other patients and teaching time   Critical care was necessary to treat or prevent imminent or life-threatening deterioration of the following conditions:  Cardiac failure, circulatory failure, respiratory failure and metabolic crisis   Critical care was time spent personally by me on the following activities:  Development of treatment plan with patient or surrogate, discussions with consultants, evaluation of patient's response to treatment, examination of patient, obtaining history from patient or surrogate, ordering and performing treatments and interventions, ordering and review of laboratory studies, ordering and review of radiographic studies, pulse oximetry, re-evaluation of patient's condition and review of old charts   I assumed direction of critical care for this patient from another provider in my specialty: no   .1-3 Lead EKG Interpretation Performed by: Shaune Pollack, MD Authorized by: Shaune Pollack, MD     Interpretation: normal     ECG rate:  70-90   ECG rate assessment: normal     Rhythm: sinus rhythm     Ectopy: none     Conduction: normal   Comments:     Indication: Sepsis    ____________________________________________  INITIAL IMPRESSION / MDM / ASSESSMENT AND PLAN / ED COURSE  As part of my medical decision making, I reviewed the following data within the electronic MEDICAL RECORD NUMBER Nursing notes reviewed and incorporated, Old chart reviewed, Notes from prior ED visits, and [place] Controlled Substance Database       *EMONTE DIEUJUSTE was evaluated in Emergency Department on 03/03/2020 for the symptoms  described in the history of present illness. He was evaluated in the context of the global COVID-19 pandemic, which necessitated consideration that the patient might be at risk for infection with the SARS-CoV-2 virus that causes COVID-19. Institutional protocols and algorithms that pertain to the evaluation of patients at risk for COVID-19 are in a state of rapid change based on information released by regulatory bodies including the CDC and federal and state organizations. These policies and algorithms were followed during the patient's care in the ED.  Some ED evaluations and interventions may be delayed as a result of limited staffing during the pandemic.*     Medical Decision Making:  29 yo M here with L hand redness, pain, swelling. Labs, imaging reviewed. Concern for early DKA vs HHS in setting of acute cellulitis of left hand, likely related to his IVDU. Glu 871, K 5.3, Cow normal however at 23 though Beta hydroxy elevated. BUN/Cr c/w mild AKI. WBC 17.3. Sepsis protocol initiated with Zyvox/Cefepime given allergies, as well as IVF and insulin gtt. Admit to stepdown.  ____________________________________________  FINAL CLINICAL IMPRESSION(S) / ED DIAGNOSES  Final diagnoses:  Sepsis due to cellulitis [facility])  Diabetic ketoacidosis without coma associated with type 1 diabetes mellitus (HCC)     MEDICATIONS GIVEN DURING THIS VISIT:  Medications  lactated ringers bolus 1,000 mL (1,000 mLs Intravenous New Bag/Given 03/03/20 1620)    And  lactated ringers bolus 1,000 mL (1,000 mLs Intravenous New Bag/Given 03/03/20 1626)  insulin regular, human (MYXREDLIN) 100 units/ 100 mL infusion (7 Units/hr Intravenous New Bag/Given 03/03/20 1633)  lactated ringers infusion (has no administration in time range)  dextrose 5 % in lactated ringers infusion (has no administration in time range)  dextrose 50 % solution 0-50 mL (has no administration in time range)  linezolid (ZYVOX) IVPB 600 mg (600 mg  Intravenous New Bag/Given 03/03/20 1626)  ceFEPIme (MAXIPIME) 2 g in sodium chloride 0.9 % 100 mL IVPB (has no administration in time range)     ED Discharge Orders    None       Note:  This document was prepared using Dragon voice recognition software and may include unintentional dictation errors.   Shaune Pollack, MD 03/03/20 [phone]

## 2020-03-03 NOTE — ED Notes (Signed)
First set blood cx drawn in triage by gabby NT

## 2020-03-03 NOTE — ED Notes (Signed)
PT taken to CT.

## 2020-03-03 NOTE — Consult Note (Signed)
CODE SEPSIS - PHARMACY COMMUNICATION  **Broad Spectrum Antibiotics should be administered within 1 hour of Sepsis diagnosis**  Time Code Sepsis Called/Page Received: 1542  Antibiotics Ordered: Linezolid/Cefepime  Time of 1st antibiotic administration: 1626  Additional action taken by pharmacy: none  If necessary, Name of Provider/Nurse Contacted: n/a    Albina Billet ,PharmD Clinical Pharmacist  03/03/2020  4:06 PM

## 2020-03-03 NOTE — Consult Note (Signed)
PHARMACY -  BRIEF ANTIBIOTIC NOTE   Pharmacy has received consult(s) for Cefepime from an ED provider.  The patient's profile has been reviewed for ht/wt/allergies/indication/available labs.    Pt has taken cephalosporins in the past  One time order(s) placed for Cefepime 2g IV x 1  Further antibiotics/pharmacy consults should be ordered by admitting physician if indicated.                       Thank you,  Albina Billet, PharmD, BCPS Clinical Pharmacist 03/03/2020 4:04 PM ]

## 2020-03-03 NOTE — ED Notes (Signed)
Pt in Lobby stabling around, pt walked outside and continues to stumble around.

## 2020-03-03 NOTE — ED Triage Notes (Addendum)
Pt is IV drug user with swelling and redness to left hand X 2 days.  Also Type I DM.  Frequent DKA.  Appears under the influence at this time.  Unsteady on feet.  Fevers at home per pt, has been taking aleve for fever.  Glucose > 600 in triage  No IV placed at this time, will wait until room available and pt can be monitored.  Discussed with dr Roxan Hockey.

## 2020-03-03 NOTE — H&P (Signed)
Trevor Brown is an 29 y.o. male.   Chief Complaint: Left hand pain. HPI: The patient is a 29 yr old man who is an IVDU and also carries a diagnosis of DM I. He is non-compliant with his insulin usage, and is known to frequently present with celluliltis and DKA for these reasons. The redness began 3-4 days ago. Now the redness is spreading up his forearm. The hand is swollen, erythematous, and tender to touch. The patient is able to move his fingers, but cannot close his fist. He has had fevers and chills. He also is having frequent urination and feelings of weakness.  [facility] hospitalists were consulted to admit the patient for further evaluation and treatment. He has been started on Zyvox. Blood cultures x 2 have been drawn. He will be admitted to step down status for glucose treatment.  Past Medical History:  Diagnosis Date  . Heart attack (HCC)    Pt claims he had heart attack a year ago ( 2015)- and was admitted in [facility] for that, but not given any meds or angiogram, on review of chart- I could not find any details like that.  . Hepatitis C, acute may 2016  . Heroin abuse (HCC)   . History of noncompliance with medical treatment   . Hypertension   . Type 1 diabetes [facility])     Past Surgical History:  Procedure Laterality Date  . NO PAST SURGERIES    . none      Family History  Problem Relation Age of Onset  . Cirrhosis Mother   . Diabetes Mellitus II Maternal Grandmother   . Diabetes Mellitus II Maternal Grandfather   . CAD Father    Social History:  reports that he has been smoking cigarettes. He has been smoking about 2.00 packs per day. He has never used smokeless tobacco. He reports previous drug use. Drugs: IV, Heroin, and Marijuana. He reports that he does not drink alcohol. (Not in a hospital admission)   Allergies:  Allergies  Allergen Reactions  . Bee Venom Anaphylaxis  . Penicillins Anaphylaxis, Hives and Other (See Comments)    Has patient had a PCN reaction causing  immediate rash, facial/tongue/throat swelling, SOB or lightheadedness with hypotension: Yes Has patient had a PCN reaction causing severe rash involving mucus membranes or skin necrosis: No Has patient had a PCN reaction that required hospitalization No Has patient had a PCN reaction occurring within the last 10 years: Yes If all of the above answers are "NO", then may proceed with Cephalosporin use.  . Ibuprofen Itching, Rash and Other (See Comments)    Other reaction(s): Other (See Comments) Stomach upset Reaction:  GI upset   . Tramadol Hives and Swelling  . Vancomycin Rash and Other (See Comments)    Reaction:  Red man's syndrome     Pertinent items noted in HPI and remainder of comprehensive ROS otherwise negative.   General appearance: alert, cooperative, mild distress and slowed mentation Head: Normocephalic, without obvious abnormality, atraumatic Eyes: conjunctivae/corneas clear. PERRL, EOM's intact. Fundi benign. Throat: lips, mucosa, and tongue normal; teeth and gums normal Neck: no adenopathy, no carotid bruit, no JVD, supple, symmetrical, trachea midline and thyroid not enlarged, symmetric, no tenderness/mass/nodules Resp: No increased work of breathing. No wheezes, rales, or rhonchi. No tactile fremitus. Chest wall: no tenderness Cardio: regular rate and rhythm, S1, S2 normal, no murmur, click, rub or gallop GI: soft, non-tender; bowel sounds normal; no masses,  no organomegaly Extremities: extremities normal, atraumatic, no  cyanosis or edema Pulses: 2+ and symmetric Skin: Skin color, texture, turgor normal. No rashes or lesions Lymph nodes: Cervical, supraclavicular, and axillary nodes normal. Neurologic: Alert and oriented X 3, normal strength and tone. Normal symmetric reflexes. Normal coordination and gait  Results for orders placed or performed during the hospital encounter of 03/03/20 (from the past 48 hour(s))  Glucose, capillary     Status: Abnormal    Collection Time: 03/03/20  1:47 PM  Result Value Ref Range   Glucose-Capillary >600 (HH) 70 - 99 mg/dL    Comment: Glucose reference range applies only to samples taken after fasting for at least 8 hours.  Comprehensive metabolic panel     Status: Abnormal   Collection Time: 03/03/20  1:57 PM  Result Value Ref Range   Sodium 125 (L) 135 - 145 mmol/L   Potassium 5.3 (H) 3.5 - 5.1 mmol/L   Chloride 91 (L) 98 - 111 mmol/L   CO2 23 22 - 32 mmol/L   Glucose, Bld 871 (HH) 70 - 99 mg/dL    Comment: CRITICAL RESULT CALLED TO, READ BACK BY AND VERIFIED WITH BRANDY DAVIS AT 1429 03/03/20.PMF Glucose reference range applies only to samples taken after fasting for at least 8 hours.    BUN 27 (H) 6 - 20 mg/dL   Creatinine, Ser 1.61 (H) 0.61 - 1.24 mg/dL   Calcium 8.5 (L) 8.9 - 10.3 mg/dL   Total Protein 7.6 6.5 - 8.1 g/dL   Albumin 3.8 3.5 - 5.0 g/dL   AST 36 15 - 41 U/L   ALT 48 (H) 0 - 44 U/L   Alkaline Phosphatase 120 38 - 126 U/L   Total Bilirubin 1.1 0.3 - 1.2 mg/dL   GFR calc non Af Amer >60 >60 mL/min   GFR calc Af Amer >60 >60 mL/min   Anion gap 11 5 - 15    Comment: Performed at [facility], [address] [place], [place] [address]  Lactic acid, plasma     Status: None   Collection Time: 03/03/20  1:57 PM  Result Value Ref Range   Lactic Acid, Venous 1.1 0.5 - 1.9 mmol/L    Comment: Performed at [facility], [address] [place], [place] [address]  CBC with Differential     Status: Abnormal   Collection Time: 03/03/20  1:57 PM  Result Value Ref Range   WBC 17.3 (H) 4.0 - 10.5 K/uL   RBC 3.93 (L) 4.22 - 5.81 MIL/uL   Hemoglobin 11.1 (L) 13.0 - 17.0 g/dL   HCT 11.9 (L) 39 - 52 %   MCV 88.3 80.0 - 100.0 fL   MCH 28.2 26.0 - 34.0 pg   MCHC 32.0 30.0 - 36.0 g/dL   RDW 14.7 82.9 - 56.2 %   Platelets 310 150 - 400 K/uL   nRBC 0.0 0.0 - 0.2 %   Neutrophils Relative % 77 %   Neutro Abs 13.5 (H) 1.7 - 7.7 K/uL   Lymphocytes Relative 14 %   Lymphs Abs 2.3  0.7 - 4.0 K/uL   Monocytes Relative 7 %   Monocytes Absolute 1.2 (H) 0 - 1 K/uL   Eosinophils Relative 1 %   Eosinophils Absolute 0.1 0 - 0 K/uL   Basophils Relative 0 %   Basophils Absolute 0.1 0 - 0 K/uL   Immature Granulocytes 1 %   Abs Immature Granulocytes 0.08 (H) 0.00 - 0.07 K/uL    Comment: Performed at [facility], [address], [place], [place] [address]  Protime-INR     Status: None   Collection Time: 03/03/20  1:57 PM  Result Value Ref Range   Prothrombin Time 13.0 11.4 - 15.2 seconds   INR 1.0 0.8 - 1.2    Comment: (NOTE) INR goal varies based on device and disease states. Performed at [facility], [address] [place], [place] [address]   Beta-hydroxybutyric acid     Status: Abnormal   Collection Time: 03/03/20  1:57 PM  Result Value Ref Range   Beta-Hydroxybutyric Acid 0.38 (H) 0.05 - 0.27 mmol/L    Comment: Performed at [facility], [address] [place], [place] [address]  Glucose, capillary     Status: Abnormal   Collection Time: 03/03/20  3:31 PM  Result Value Ref Range   Glucose-Capillary >600 (HH) 70 - 99 mg/dL    Comment: Glucose reference range applies only to samples taken after fasting for at least 8 hours.  Blood gas, venous     Status: None   Collection Time: 03/03/20  3:37 PM  Result Value Ref Range   pH, Ven 7.31 7.25 - 7.43   pCO2, Ven 55 44 - 60 mmHg   Bicarbonate 27.7 20.0 - 28.0 mmol/L   Acid-Base Excess 0.7 0.0 - 2.0 mmol/L   O2 Saturation 48.0 %   Patient temperature 37.0    Collection site VEIN    Sample type VENIPUNCTURE     Comment: Performed at [facility], [address] [place], [place] [address]  Lactic acid, plasma     Status: None   Collection Time: 03/03/20  4:13 PM  Result Value Ref Range   Lactic Acid, Venous 1.1 0.5 - 1.9 mmol/L    Comment: Performed at [facility], [address] [place], [place] [address]  Urinalysis, Routine w reflex microscopic     Status:  Abnormal   Collection Time: 03/03/20  4:13 PM  Result Value Ref Range   Color, Urine YELLOW (A) YELLOW   APPearance HAZY (A) CLEAR   Specific Gravity, Urine 1.028 1.005 - 1.030   pH 5.0 5.0 - 8.0   Glucose, UA >=500 (A) NEGATIVE mg/dL   Hgb urine dipstick MODERATE (A) NEGATIVE   Bilirubin Urine NEGATIVE NEGATIVE   Ketones, ur NEGATIVE NEGATIVE mg/dL   Protein, ur NEGATIVE NEGATIVE mg/dL   Nitrite NEGATIVE NEGATIVE   Leukocytes,Ua NEGATIVE NEGATIVE   RBC / HPF 11-20 0 - 5 RBC/hpf   WBC, UA 0-5 0 - 5 WBC/hpf   Bacteria, UA NONE SEEN NONE SEEN   Squamous Epithelial / LPF 0-5 0 - 5    Comment: Performed at [facility], [address] [place], [place] [address]  SARS Coronavirus 2 by RT PCR (hospital order, performed in [facility] hospital lab) Nasopharyngeal Nasopharyngeal Swab     Status: None   Collection Time: 03/03/20  5:10 PM   Specimen: Nasopharyngeal Swab  Result Value Ref Range   SARS Coronavirus 2 NEGATIVE NEGATIVE    Comment: (NOTE) SARS-CoV-2 target nucleic acids are NOT DETECTED.  The SARS-CoV-2 RNA is generally detectable in upper and lower respiratory specimens during the acute phase of infection. The lowest concentration of SARS-CoV-2 viral copies this assay can detect is 250 copies / mL. A negative result does not preclude SARS-CoV-2 infection and should not be used as the sole basis for treatment or other patient management decisions.  A negative result may occur with improper specimen collection / handling, submission  of specimen other than nasopharyngeal swab, presence of viral mutation(s) within the areas targeted by this assay, and inadequate number of viral copies (<250 copies / mL). A negative result must be combined with clinical observations, patient history, and epidemiological information.  Fact Sheet for Patients:   BoilerBrush.com.cy  Fact Sheet for Healthcare  Providers: https://pope.com/  This test is not yet approved or  cleared by the [place] FDA and has been authorized for detection and/or diagnosis of SARS-CoV-2 by FDA under an Emergency Use Authorization (EUA).  This EUA will remain in effect (meaning this test can be used) for the duration of the COVID-19 declaration under Section 564(b)(1) of the Act, 21 U.S.C. section 360bbb-3(b)(1), unless the authorization is terminated or revoked sooner.  Performed at [facility], [address] [place], [place] [address]   Glucose, capillary     Status: Abnormal   Collection Time: 03/03/20  5:33 PM  Result Value Ref Range   Glucose-Capillary >600 (HH) 70 - 99 mg/dL    Comment: Glucose reference range applies only to samples taken after fasting for at least 8 hours.  CBC     Status: Abnormal   Collection Time: 03/03/20  7:13 PM  Result Value Ref Range   WBC 17.4 (H) 4.0 - 10.5 K/uL   RBC 3.62 (L) 4.22 - 5.81 MIL/uL   Hemoglobin 10.3 (L) 13.0 - 17.0 g/dL   HCT 60.4 (L) 39 - 52 %   MCV 86.2 80.0 - 100.0 fL   MCH 28.5 26.0 - 34.0 pg   MCHC 33.0 30.0 - 36.0 g/dL   RDW 54.0 98.1 - 19.1 %   Platelets 251 150 - 400 K/uL   nRBC 0.0 0.0 - 0.2 %    Comment: Performed at [facility], [address], [place], [place] [address]  Basic metabolic panel     Status: Abnormal   Collection Time: 03/03/20  7:13 PM  Result Value Ref Range   Sodium 133 (L) 135 - 145 mmol/L   Potassium 3.7 3.5 - 5.1 mmol/L   Chloride 96 (L) 98 - 111 mmol/L   CO2 28 22 - 32 mmol/L   Glucose, Bld 442 (H) 70 - 99 mg/dL    Comment: Glucose reference range applies only to samples taken after fasting for at least 8 hours.   BUN 23 (H) 6 - 20 mg/dL   Creatinine, Ser 5.62 0.61 - 1.24 mg/dL   Calcium 8.5 (L) 8.9 - 10.3 mg/dL   GFR calc non Af Amer >60 >60 mL/min   GFR calc Af Amer >60 >60 mL/min   Anion gap 9 5 - 15    Comment: Performed at [facility], [address], [place], [place] [address]  Magnesium     Status: None   Collection Time: 03/03/20  7:13 PM  Result Value Ref Range   Magnesium 1.9 1.7 - 2.4 mg/dL    Comment: Performed at [facility], [address] [place], [place] [address]  Glucose, capillary     Status: Abnormal   Collection Time: 03/03/20  7:21 PM  Result Value Ref Range   Glucose-Capillary 427 (H) 70 - 99 mg/dL    Comment: Glucose reference range applies only to samples taken after fasting for at least 8 hours.   @  Blood pressure 137/86, pulse 74, temperature 98.4 F (36.9 C), temperature source Oral, resp. rate 13, height  (1.626 m), weight 59 kg, SpO2 99 %.   Assessment/Plan  Problem  Sirs (Systemic Inflammatory Response Syndrome) (Hcc)  Cellulitis of Left Hand  Polysubstance Abuse (Hcc)  Mrsa Carrier  Type 1 Diabetes Mellitus With Hyperglycemia (Hcc)  Cocaine abuse (HCC)  Malnutrition of moderate degree (HCC)  Diabetes Mellitus Type 1, Uncontrolled (Hcc)  Homelessness   Cellulitis of the left hand: Likely a consequence of the patient's IVDU. X-ray of the hand demonstrated subcutaneous edema, and no abscess. Will check CT of the hand to be sure. Blood cultures x 2 have been ordered. Consider echocardiogram. No murmurs appreciated on today's exam. He will have oxycodone for pain.   Sepsis: The patient presented with tachycardia, hypotension, and he had had fevera t home.Improved after IV fluids.  Hyperosmotic non-ketotic hyperglycemia: Due to non-compliance with DM I. The patient has been admitted to step down status on a glucose stabilizer.   Uncontrolled DM I: Pt is non-compliant with his insulin.   Homelessness: Complicates all issues.  Cachexia/malnutrition: Consult nutrition  I have seen and examined this patient myself. I have spent 82 minutes in his evaluation and care.  DVT prophylaxis: Lovenox CODE STATUS: Full Code Family communication: None available Disposition:   Status is: Inpatient  Remains inpatient appropriate because:IV treatments appropriate due to intensity of illness or inability to take PO  Dispo: The patient is from: Homeless              Anticipated d/c is to: Homeless              Anticipated d/c date is: 3 days              Patient currently is not medically stable to d/c.   Rafaelita Foister 03/03/2020, 8:17 PM

## 2020-03-03 NOTE — ED Notes (Signed)
Attempted IV x2 without success. Pt has extensive scar tissue.

## 2020-03-04 ENCOUNTER — Encounter
Admission: EM | Disposition: A | Discharge status: Home / Self Care | Payer: Self-pay | Source: Home / Self Care | Attending: Internal Medicine

## 2020-03-04 ENCOUNTER — Inpatient Hospital Stay: Payer: Self-pay | Admitting: Certified Registered"

## 2020-03-04 HISTORY — PX: INCISION AND DRAINAGE ABSCESS: SHX5864

## 2020-03-04 LAB — BASIC METABOLIC PANEL
Anion gap: 9 (ref 5–15)
Anion gap: 9 (ref 5–15)
Anion gap: 8 (ref 5–15)
Anion gap: 8 (ref 5–15)
BUN: 23 mg/dL — ABNORMAL HIGH (ref 6–20)
BUN: 21 mg/dL — ABNORMAL HIGH (ref 6–20)
BUN: 18 mg/dL (ref 6–20)
BUN: 15 mg/dL (ref 6–20)
CO2: 28 mmol/L (ref 22–32)
CO2: 26 mmol/L (ref 22–32)
CO2: 27 mmol/L (ref 22–32)
CO2: 25 mmol/L (ref 22–32)
Calcium: 8.4 mg/dL — ABNORMAL LOW (ref 8.9–10.3)
Calcium: 8.4 mg/dL — ABNORMAL LOW (ref 8.9–10.3)
Calcium: 8.3 mg/dL — ABNORMAL LOW (ref 8.9–10.3)
Calcium: 8.1 mg/dL — ABNORMAL LOW (ref 8.9–10.3)
Chloride: 98 mmol/L (ref 98–111)
Chloride: 98 mmol/L (ref 98–111)
Chloride: 97 mmol/L — ABNORMAL LOW (ref 98–111)
Chloride: 95 mmol/L — ABNORMAL LOW (ref 98–111)
Creatinine, Ser: 0.78 mg/dL (ref 0.61–1.24)
Creatinine, Ser: 0.76 mg/dL (ref 0.61–1.24)
Creatinine, Ser: 0.74 mg/dL (ref 0.61–1.24)
Creatinine, Ser: 0.76 mg/dL (ref 0.61–1.24)
GFR calc Af Amer: >60 mL/min (ref >60)
GFR calc Af Amer: >60 mL/min (ref >60)
GFR calc Af Amer: >60 mL/min (ref >60)
GFR calc Af Amer: >60 mL/min (ref >60)
GFR calc non Af Amer: >60 mL/min (ref >60)
GFR calc non Af Amer: >60 mL/min (ref >60)
GFR calc non Af Amer: >60 mL/min (ref >60)
GFR calc non Af Amer: >60 mL/min (ref >60)
Glucose, Bld: 149 mg/dL — ABNORMAL HIGH (ref 70–99)
Glucose, Bld: 160 mg/dL — ABNORMAL HIGH (ref 70–99)
Glucose, Bld: 145 mg/dL — ABNORMAL HIGH (ref 70–99)
Glucose, Bld: 214 mg/dL — ABNORMAL HIGH (ref 70–99)
Potassium: 4.3 mmol/L (ref 3.5–5.1)
Potassium: 4 mmol/L (ref 3.5–5.1)
Potassium: 3.8 mmol/L (ref 3.5–5.1)
Potassium: 3.8 mmol/L (ref 3.5–5.1)
Sodium: 135 mmol/L (ref 135–145)
Sodium: 133 mmol/L — ABNORMAL LOW (ref 135–145)
Sodium: 132 mmol/L — ABNORMAL LOW (ref 135–145)
Sodium: 128 mmol/L — ABNORMAL LOW (ref 135–145)

## 2020-03-04 LAB — GLUCOSE, CAPILLARY
Glucose-Capillary: 118 mg/dL — ABNORMAL HIGH (ref 70–99)
Glucose-Capillary: 142 mg/dL — ABNORMAL HIGH (ref 70–99)
Glucose-Capillary: 147 mg/dL — ABNORMAL HIGH (ref 70–99)
Glucose-Capillary: 158 mg/dL — ABNORMAL HIGH (ref 70–99)
Glucose-Capillary: 159 mg/dL — ABNORMAL HIGH (ref 70–99)
Glucose-Capillary: 171 mg/dL — ABNORMAL HIGH (ref 70–99)
Glucose-Capillary: 216 mg/dL — ABNORMAL HIGH (ref 70–99)
Glucose-Capillary: 234 mg/dL — ABNORMAL HIGH (ref 70–99)
Glucose-Capillary: 284 mg/dL — ABNORMAL HIGH (ref 70–99)
Glucose-Capillary: 515 mg/dL (ref 70–99)

## 2020-03-04 LAB — CBC
HCT: 28.8 % — ABNORMAL LOW (ref 39.0–52.0)
Hemoglobin: 10.1 g/dL — ABNORMAL LOW (ref 13.0–17.0)
MCH: 29 pg (ref 26.0–34.0)
MCHC: 35.1 g/dL (ref 30.0–36.0)
MCV: 82.8 fL (ref 80.0–100.0)
Platelets: 263 10*3/uL (ref 150–400)
RBC: 3.48 MIL/uL — ABNORMAL LOW (ref 4.22–5.81)
RDW: 14.1 % (ref 11.5–15.5)
WBC: 17.6 10*3/uL — ABNORMAL HIGH (ref 4.0–10.5)
nRBC: 0 % (ref 0.0–0.2)

## 2020-03-04 LAB — HEMOGLOBIN A1C
Hgb A1c MFr Bld: 11.1 % — ABNORMAL HIGH (ref 4.8–5.6)
Mean Plasma Glucose: 271.87 mg/dL

## 2020-03-04 LAB — MRSA PCR SCREENING: MRSA by PCR: NEGATIVE

## 2020-03-04 SURGERY — INCISION AND DRAINAGE, ABSCESS
Anesthesia: General | Site: Hand | Laterality: Left

## 2020-03-04 MED ORDER — DIPHENHYDRAMINE HCL 50 MG/ML IJ SOLN
INTRAMUSCULAR | Status: AC
  Filled 2020-03-04: qty 1

## 2020-03-04 MED ORDER — FENTANYL CITRATE (PF) 100 MCG/2ML IJ SOLN
INTRAMUSCULAR | Status: AC
  Filled 2020-03-04: qty 2

## 2020-03-04 MED ORDER — ONDANSETRON HCL 4 MG/2ML IJ SOLN
INTRAMUSCULAR | Status: AC
  Filled 2020-03-04: qty 2

## 2020-03-04 MED ORDER — ACETAMINOPHEN 10 MG/ML IV SOLN
INTRAVENOUS | Status: AC
  Filled 2020-03-04: qty 100

## 2020-03-04 MED ORDER — METOCLOPRAMIDE HCL 5 MG PO TABS: 5.0000 mg | ORAL_TABLET | Freq: Three times a day (TID) | ORAL | Status: DC | PRN

## 2020-03-04 MED ORDER — DEXMEDETOMIDINE HCL IN NACL 400 MCG/100ML IV SOLN
INTRAVENOUS | Status: DC | PRN
Start: 2020-03-04 — End: 2020-03-04
  Administered 2020-03-04: 16 ug via INTRAVENOUS

## 2020-03-04 MED ORDER — PROPOFOL 10 MG/ML IV BOLUS
INTRAVENOUS | Status: DC | PRN
  Administered 2020-03-04: 200 mg via INTRAVENOUS

## 2020-03-04 MED ORDER — ONDANSETRON HCL 4 MG PO TABS: 4.0000 mg | ORAL_TABLET | Freq: Four times a day (QID) | ORAL | Status: DC | PRN

## 2020-03-04 MED ORDER — INSULIN ASPART 100 UNIT/ML ~~LOC~~ SOLN
20.0000 [IU] | Freq: Once | SUBCUTANEOUS | Status: AC
  Administered 2020-03-04: 20 [IU] via SUBCUTANEOUS

## 2020-03-04 MED ORDER — ACETAMINOPHEN 325 MG PO TABS
ORAL_TABLET | ORAL | Status: AC
  Filled 2020-03-04: qty 2

## 2020-03-04 MED ORDER — FLEET ENEMA 7-19 GM/118ML RE ENEM: 1.0000 | ENEMA | Freq: Once | RECTAL | Status: DC | PRN

## 2020-03-04 MED ORDER — DIPHENHYDRAMINE HCL 12.5 MG/5ML PO ELIX
12.5000 mg | ORAL_SOLUTION | ORAL | Status: DC | PRN
  Filled 2020-03-04: qty 10

## 2020-03-04 MED ORDER — HYDROMORPHONE HCL 1 MG/ML IJ SOLN
1.0000 mg | INTRAMUSCULAR | Status: DC | PRN
  Administered 2020-03-04 – 2020-03-08 (×21): 1 mg via INTRAVENOUS
  Filled 2020-03-04 (×21): qty 1

## 2020-03-04 MED ORDER — INSULIN ASPART 100 UNIT/ML ~~LOC~~ SOLN
0.0000 [IU] | Freq: Three times a day (TID) | SUBCUTANEOUS | Status: DC
  Administered 2020-03-04 – 2020-03-05 (×5): 8 [IU] via SUBCUTANEOUS
  Administered 2020-03-06: 15 [IU] via SUBCUTANEOUS
  Administered 2020-03-06 (×2): 5 [IU] via SUBCUTANEOUS
  Administered 2020-03-06: 8 [IU] via SUBCUTANEOUS
  Administered 2020-03-07: 3 [IU] via SUBCUTANEOUS
  Administered 2020-03-07: 2 [IU] via SUBCUTANEOUS
  Administered 2020-03-07: 15 [IU] via SUBCUTANEOUS
  Administered 2020-03-07: 8 [IU] via SUBCUTANEOUS
  Administered 2020-03-08: 5 [IU] via SUBCUTANEOUS
  Filled 2020-03-04 (×15): qty 1

## 2020-03-04 MED ORDER — FAMOTIDINE 20 MG PO TABS
20.0000 mg | ORAL_TABLET | Freq: Two times a day (BID) | ORAL | Status: DC
  Administered 2020-03-04 – 2020-03-08 (×8): 20 mg via ORAL
  Filled 2020-03-04 (×9): qty 1

## 2020-03-04 MED ORDER — MEPERIDINE HCL 50 MG/ML IJ SOLN: 6.2500 mg | INTRAMUSCULAR | Status: DC | PRN

## 2020-03-04 MED ORDER — PROMETHAZINE HCL 25 MG/ML IJ SOLN: 6.2500 mg | INTRAMUSCULAR | Status: DC | PRN

## 2020-03-04 MED ORDER — MAGNESIUM HYDROXIDE 400 MG/5ML PO SUSP: 30.0000 mL | Freq: Every day | ORAL | Status: DC | PRN

## 2020-03-04 MED ORDER — FENTANYL CITRATE (PF) 100 MCG/2ML IJ SOLN
INTRAMUSCULAR | Status: DC | PRN
  Administered 2020-03-04 (×5): 50 ug via INTRAVENOUS

## 2020-03-04 MED ORDER — INSULIN GLARGINE 100 UNIT/ML ~~LOC~~ SOLN
10.0000 [IU] | SUBCUTANEOUS | Status: DC
  Administered 2020-03-04: 10 [IU] via SUBCUTANEOUS
  Filled 2020-03-04 (×2): qty 0.1

## 2020-03-04 MED ORDER — METOCLOPRAMIDE HCL 5 MG/ML IJ SOLN: 5.0000 mg | Freq: Three times a day (TID) | INTRAMUSCULAR | Status: DC | PRN

## 2020-03-04 MED ORDER — KETOROLAC TROMETHAMINE 30 MG/ML IJ SOLN
INTRAMUSCULAR | Status: AC
  Filled 2020-03-04: qty 1

## 2020-03-04 MED ORDER — GABAPENTIN 400 MG PO CAPS
400.0000 mg | ORAL_CAPSULE | Freq: Every day | ORAL | Status: DC
  Administered 2020-03-04 – 2020-03-07 (×4): 400 mg via ORAL
  Filled 2020-03-04 (×4): qty 1

## 2020-03-04 MED ORDER — FENTANYL CITRATE (PF) 100 MCG/2ML IJ SOLN
INTRAMUSCULAR | Status: AC
  Administered 2020-03-04: 25 ug via INTRAVENOUS
  Filled 2020-03-04: qty 2

## 2020-03-04 MED ORDER — OXYCODONE HCL 5 MG PO TABS
ORAL_TABLET | ORAL | Status: AC
  Filled 2020-03-04: qty 3

## 2020-03-04 MED ORDER — DOCUSATE SODIUM 100 MG PO CAPS
100.0000 mg | ORAL_CAPSULE | Freq: Two times a day (BID) | ORAL | Status: DC
  Administered 2020-03-08: 100 mg via ORAL
  Filled 2020-03-04 (×5): qty 1

## 2020-03-04 MED ORDER — ONDANSETRON HCL 4 MG/2ML IJ SOLN: 4.0000 mg | Freq: Four times a day (QID) | INTRAMUSCULAR | Status: DC | PRN

## 2020-03-04 MED ORDER — LIDOCAINE HCL (CARDIAC) PF 100 MG/5ML IV SOSY
PREFILLED_SYRINGE | INTRAVENOUS | Status: DC | PRN
  Administered 2020-03-04: 100 mg via INTRAVENOUS

## 2020-03-04 MED ORDER — DIPHENHYDRAMINE HCL 50 MG/ML IJ SOLN
INTRAMUSCULAR | Status: DC | PRN
  Administered 2020-03-04: 6.25 mg via INTRAVENOUS

## 2020-03-04 MED ORDER — BISACODYL 10 MG RE SUPP: 10.0000 mg | Freq: Every day | RECTAL | Status: DC | PRN

## 2020-03-04 MED ORDER — SENNOSIDES-DOCUSATE SODIUM 8.6-50 MG PO TABS
2.0000 | ORAL_TABLET | Freq: Two times a day (BID) | ORAL | Status: DC
  Administered 2020-03-08: 2 via ORAL
  Filled 2020-03-04 (×6): qty 2

## 2020-03-04 MED ORDER — MIDAZOLAM HCL 2 MG/2ML IJ SOLN
INTRAMUSCULAR | Status: DC | PRN
  Administered 2020-03-04: 2 mg via INTRAVENOUS

## 2020-03-04 MED ORDER — OXYCODONE HCL 5 MG PO TABS
15.0000 mg | ORAL_TABLET | ORAL | Status: DC | PRN
  Administered 2020-03-04 – 2020-03-08 (×27): 15 mg via ORAL
  Filled 2020-03-04 (×27): qty 3

## 2020-03-04 MED ORDER — KETOROLAC TROMETHAMINE 30 MG/ML IJ SOLN
INTRAMUSCULAR | Status: DC | PRN
  Administered 2020-03-04: 30 mg via INTRAVENOUS

## 2020-03-04 MED ORDER — MIDAZOLAM HCL 2 MG/2ML IJ SOLN
INTRAMUSCULAR | Status: AC
  Filled 2020-03-04: qty 2

## 2020-03-04 MED ORDER — ACETAMINOPHEN 10 MG/ML IV SOLN
INTRAVENOUS | Status: DC | PRN
  Administered 2020-03-04: 1000 mg via INTRAVENOUS

## 2020-03-04 MED ORDER — ACETAMINOPHEN 325 MG PO TABS
325.0000 mg | ORAL_TABLET | ORAL | Status: DC | PRN
  Administered 2020-03-04: 650 mg via ORAL

## 2020-03-04 MED ORDER — DEXMEDETOMIDINE HCL IN NACL 80 MCG/20ML IV SOLN
INTRAVENOUS | Status: AC
  Filled 2020-03-04: qty 20

## 2020-03-04 MED ORDER — ACETAMINOPHEN 160 MG/5ML PO SOLN
325.0000 mg | ORAL | Status: DC | PRN
  Filled 2020-03-04: qty 20.3

## 2020-03-04 MED ORDER — ONDANSETRON HCL 4 MG/2ML IJ SOLN
INTRAMUSCULAR | Status: DC | PRN
  Administered 2020-03-04: 4 mg via INTRAVENOUS

## 2020-03-04 MED ORDER — FENTANYL CITRATE (PF) 100 MCG/2ML IJ SOLN
25.0000 ug | INTRAMUSCULAR | Status: DC | PRN
  Administered 2020-03-04 (×2): 25 ug via INTRAVENOUS
  Administered 2020-03-04: 50 ug via INTRAVENOUS

## 2020-03-04 MED ORDER — LACTATED RINGERS IV SOLN: INTRAVENOUS | Status: DC | PRN

## 2020-03-04 SURGICAL SUPPLY — 27 items
BLADE SURG 15 STRL LF DISP TIS (BLADE) ×2 IMPLANT
BLADE SURG 15 STRL SS (BLADE) ×4
BNDG COHESIVE 4X5 TAN STRL (GAUZE/BANDAGES/DRESSINGS) ×3 IMPLANT
BNDG ELASTIC 3X5.8 VLCR STR LF (GAUZE/BANDAGES/DRESSINGS) ×3 IMPLANT
CAST PADDING 3X4FT ST 30246 (SOFTGOODS) ×2
CHLORAPREP W/TINT 26 (MISCELLANEOUS) ×3 IMPLANT
CUFF TOURN SGL QUICK 18X4 (TOURNIQUET CUFF) ×3 IMPLANT
DRAIN PENROSE 1/4X12 LTX STRL (WOUND CARE) ×3 IMPLANT
DRAPE 3/4 80X56 (DRAPES) ×6 IMPLANT
DRAPE EXTREMITY 106X87X128.5 (DRAPES) ×3 IMPLANT
DRAPE IMP U-DRAPE 54X76 (DRAPES) ×3 IMPLANT
ELECT REM PT RETURN 9FT ADLT (ELECTROSURGICAL) ×3
ELECTRODE REM PT RTRN 9FT ADLT (ELECTROSURGICAL) ×1 IMPLANT
GAUZE SPONGE 4X4 12PLY STRL (GAUZE/BANDAGES/DRESSINGS) ×3 IMPLANT
GAUZE XEROFORM 1X8 LF (GAUZE/BANDAGES/DRESSINGS) ×3 IMPLANT
KIT TURNOVER KIT A (KITS) ×3 IMPLANT
NDL HPO THNWL 1X22GA REG BVL (NEEDLE) ×1 IMPLANT
NEEDLE SAFETY 22GX1 (NEEDLE) ×2
NS IRRIG 1000ML POUR BTL (IV SOLUTION) ×3 IMPLANT
NS IRRIG 500ML POUR BTL (IV SOLUTION) ×3 IMPLANT
PACK BASIN MINOR (MISCELLANEOUS) ×3 IMPLANT
PAD CAST CTTN 3X4 STRL (SOFTGOODS) ×1 IMPLANT
PENCIL ELECTRO HAND CTR (MISCELLANEOUS) ×3 IMPLANT
SPLINT CAST 1 STEP 3X12 (MISCELLANEOUS) ×3 IMPLANT
STOCKINETTE IMPERV 14X48 (MISCELLANEOUS) ×3 IMPLANT
SUT PROLENE 4 0 PS 2 18 (SUTURE) ×6 IMPLANT
SWAB CULTURE AMIES ANAERIB BLU (MISCELLANEOUS) ×3 IMPLANT

## 2020-03-04 NOTE — Anesthesia Preprocedure Evaluation (Addendum)
Anesthesia Evaluation  Patient identified by MRN, date of birth, ID band Patient awake    Reviewed: Allergy & Precautions, H&P , NPO status , reviewed documented beta blocker date and time   Airway Mallampati: I  TM Distance: >3 FB Neck ROM: full    Dental  (+) Poor Dentition, Dental Advidsory Given No remaining intact teeth. Very poor dentition. Nothing loose.:   Pulmonary Current Smoker and Patient abstained from smoking.,    Pulmonary exam normal        Cardiovascular hypertension, + Past MI  Normal cardiovascular exam  Hx pos troponin, neg cardiac w/u   Neuro/Psych PSYCHIATRIC DISORDERS Depression  Neuromuscular disease    GI/Hepatic neg GERD  ,(+) Hepatitis -, C  Endo/Other  diabetes  Renal/GU      Musculoskeletal   Abdominal   Peds  Hematology   Anesthesia Other Findings Past Medical History: No date: Heart attack [facility])     Comment:  Pt claims he had heart attack a year ago ( 2015)- and               was admitted in [facility] for that, but not given any meds or               angiogram, on review of chart- I could not find any               details like that. may 2016: Hepatitis C, acute No date: Heroin abuse (HCC) No date: History of noncompliance with medical treatment No date: Hypertension No date: Type 1 diabetes (HCC)  Past Surgical History: I&D arm, multiple   BMI    Body Mass Index: 22.31 kg/m      Reproductive/Obstetrics                            Anesthesia Physical Anesthesia Plan  ASA: III and emergent  Anesthesia Plan: General   Post-op Pain Management:    Induction: Intravenous  PONV Risk Score and Plan: 2 and Treatment may vary due to age or medical condition, TIVA, Ondansetron and Midazolam  Airway Management Planned: LMA  Additional Equipment:   Intra-op Plan:   Post-operative Plan: Extubation in OR  Informed Consent: I have reviewed the patients  History and Physical, chart, labs and discussed the procedure including the risks, benefits and alternatives for the proposed anesthesia with the patient or authorized representative who has indicated his/her understanding and acceptance.     Dental Advisory Given  Plan Discussed with: CRNA  Anesthesia Plan Comments:        Anesthesia Quick Evaluation

## 2020-03-04 NOTE — ED Notes (Signed)
RN messaged provider about transitioning to SQ insulin after being prompted by [employer].

## 2020-03-04 NOTE — Progress Notes (Signed)
CBG was 515 earlier. Manuela Schwartz NP notified. She gave an order for novolog 20 units once and IVF changed from [id number] to [facility]

## 2020-03-04 NOTE — Progress Notes (Signed)
Patient has an allergy to tramadol. Dr Joice Lofts gave order to discontinue tramadol

## 2020-03-04 NOTE — Consult Note (Signed)
ORTHOPAEDIC CONSULTATION  REQUESTING PHYSICIAN: Darlin DropHall, Carole N, DO  Chief Complaint:   Left hand and wrist pain, swelling, and erythema.  History of Present Illness: Trevor Brown is a 29 y.o. male with a history of IV drug abuse, hep C, hypertension, and type 1 diabetes who presented to the emergency room yesterday with a 4-day history of pain, swelling, and erythema in the left hand and wrist region extending into the distal forearm region.  He denies any history of injury to the area, but admits that he was trying to shoot up in this area prior to the presentation of the symptoms.  He has had fevers and chills prior to his admission, according to the patient, but has been afebrile since admission.  The patient was started on IV antibiotics yesterday evening for the treatment of presumed cellulitis.  A CT scan of the wrist was obtained last evening as well which has demonstrated the presence of an abscess in the radial wrist region, prompting this consultation.  Past Medical History:  Diagnosis Date   Heart attack (HCC)    Pt claims he had heart attack a year ago ( 2015)- and was admitted in [facility] for that, but not given any meds or angiogram, on review of chart- I could not find any details like that.   Hepatitis C, acute may 2016   Heroin abuse (HCC)    History of noncompliance with medical treatment    Hypertension    Type 1 diabetes (HCC)    Past Surgical History:  Procedure Laterality Date   NO PAST SURGERIES     none     Social History   Socioeconomic History   Marital status: Single    Spouse name: Not on file   Number of children: Not on file   Years of education: Not on file   Highest education level: Not on file  Occupational History   Occupation: tree cutter  Tobacco Use   Smoking status: Current Every Day Smoker    Packs/day: 2.00    Types: Cigarettes   Smokeless tobacco: Never Used   Vaping Use   Vaping Use: Never used  Substance and Sexual Activity   Alcohol use: No    Alcohol/week: 0.0 standard drinks   Drug use: Not Currently    Types: IV, Heroin, Marijuana    Comment: heroin-    Sexual activity: Yes  Other Topics Concern   Not on file  Social History Narrative   Not on file   Social Determinants of Health   Financial Resource Strain: High Risk   Difficulty of Paying Living Expenses: Hard  Food Insecurity: No Food Insecurity   Worried About Running Out of Food in the Last Year: Never true   Ran Out of Food in the Last Year: Never true  Transportation Needs: No Transportation Needs   Lack of Transportation (Medical): No   Lack of Transportation (Non-Medical): No  Physical Activity: Sufficiently Active   Days of Exercise per Week: 7 days   Minutes of Exercise per Session: 60 min  Stress:    Feeling of Stress :   Social Connections: Moderately Isolated   Frequency of Communication with Friends and Family: More than three times a week   Frequency of Social Gatherings with Friends and Family: More than three times a week   Attends Religious Services: Never   Database administratorActive Member of Clubs or Organizations: No   Attends BankerClub or Organization Meetings: Never   Marital Status: Living with partner  Family History  Problem Relation Age of Onset   Cirrhosis Mother    Diabetes Mellitus II Maternal Grandmother    Diabetes Mellitus II Maternal Grandfather    CAD Father    Allergies  Allergen Reactions   Bee Venom Anaphylaxis   Penicillins Anaphylaxis, Hives and Other (See Comments)    Has patient had a PCN reaction causing immediate rash, facial/tongue/throat swelling, SOB or lightheadedness with hypotension: Yes Has patient had a PCN reaction causing severe rash involving mucus membranes or skin necrosis: No Has patient had a PCN reaction that required hospitalization No Has patient had a PCN reaction occurring within the last 10  years: Yes If all of the above answers are "NO", then may proceed with Cephalosporin use.   Ibuprofen Itching, Rash and Other (See Comments)    Other reaction(s): Other (See Comments) Stomach upset Reaction:  GI upset    Tramadol Hives and Swelling   Vancomycin Rash and Other (See Comments)    Reaction:  Red man's syndrome    Prior to Admission medications   Medication Sig Start Date End Date Taking? Authorizing Provider  famotidine (PEPCID) 20 MG tablet Take 20 mg by mouth 2 (two) times daily.   Yes [provider]  gabapentin (NEURONTIN) 400 MG capsule Take 1 capsule (400 mg total) by mouth at bedtime. 01/18/20  Yes Iloabachie, [name], NP  insulin glargine (LANTUS) 100 UNIT/ML injection Inject 25 Units into the skin daily.   Yes [provider]  glucose blood (FREESTYLE LITE) test strip To be used as directed for checking her blood sugar 06/28/19   Iloabachie, [name], NP  HUMALOG KWIKPEN 100 UNIT/ML KwikPen INJECT 5-10 UNITS INTO THE SKIN BEFORE MEALS PER SLIDING SCALE. IF BLOOD SUGAR IS 120-250=5 UNITS; 251-350=8 UNITS; OVER 350=10 UNITS Patient not taking: Reported on 03/03/2020 02/22/20   Mike Gip, FNP  Insulin Glargine [facility] KWIKPEN) 100 UNIT/ML Inject 0.25 mLs (25 Units total) into the skin daily. 10/28/19 11/27/19  Mike Gip, FNP  ondansetron (ZOFRAN ODT) 4 MG disintegrating tablet Take 1 tablet (4 mg total) by mouth every 8 (eight) hours as needed for nausea or vomiting. Patient not taking: Reported on 03/03/2020 10/29/19   Sharman Cheek, MD   DG Chest 2 View  Result Date: 03/03/2020 CLINICAL DATA:  Sepsis EXAM: CHEST - 2 VIEW COMPARISON:  September 12, 2019 FINDINGS: The cardiomediastinal silhouette is normal in contour. No pleural effusion. No pneumothorax. No acute pleuroparenchymal abnormality. Visualized abdomen is unremarkable. No acute osseous abnormality noted. IMPRESSION: No acute cardiopulmonary abnormality. Electronically Signed   By:  Meda Klinefelter MD   On: 03/03/2020 14:21   CT HAND LEFT W CONTRAST  Result Date: 03/03/2020 CLINICAL DATA:  Left-handed pain erythema IV drug abuse EXAM: CT OF THE UPPER LEFT EXTREMITY WITHOUT CONTRAST TECHNIQUE: Multidetector CT imaging of the upper left extremity was performed according to the standard protocol. COMPARISON:  Radiograph same day FINDINGS: Bones/Joint/Cartilage No fracture or dislocation. The articular surfaces appear to be maintained. No large joint effusions are noted. Ligaments Suboptimally assessed by CT. Muscles and Tendons There is a multilocular fluid collection seen overlying the radial aspect of the wrist at the base of the first [facility] joint measuring 2.1 x 0.9 x 3.2 cm. The collection appears to abut surface of the extensor compartment 1 which as peripheral enhancement and a small amount of fluid seen within it. There is diffuse overlying edema containing within today myotendinous junction of the abductor pollicis longus. The remainder of the  flexor and extensor tendons appear to be intact. Soft tissues As described above multilocular fluid collection overlying extensor compartment 1. There is diffuse skin thickening and subcutaneous edema seen around the radial aspect of the wrist. No subcutaneous emphysema is seen. IMPRESSION: Multilocular collection, likely soft tissue abscess overlying the radial aspect of the wrist measuring 2.1x0.9 x 3.2 cm. The collection abuts the surface of the extensor compartment 1 where there is findings likely consistent with tenosynovitis, infectious or inflammatory. Electronically Signed   By: Jonna Clark M.D.   On: 03/03/2020 18:49   DG Hand Complete Left  Result Date: 03/03/2020 CLINICAL DATA:  Left hand pain, erythema EXAM: LEFT HAND - COMPLETE 3+ VIEW COMPARISON:  None. FINDINGS: Normal alignment. No fracture or dislocation. No destructive osseous lesion. No retained radiopaque foreign body. Diffuse soft tissue swelling is noted of the left  hand and visualized left wrist. IMPRESSION: Soft tissue swelling without acute osseous abnormality. Electronically Signed   By: Helyn Numbers MD   On: 03/03/2020 17:19    Positive ROS: All other systems have been reviewed and were otherwise negative with the exception of those mentioned in the HPI and as above.  Physical Exam: General:  Alert, no acute distress Psychiatric:  Patient is competent for consent with normal mood and affect   Cardiovascular:  No pedal edema Respiratory:  No wheezing, non-labored breathing GI:  Abdomen is soft and non-tender Skin:  No lesions in the area of chief complaint Neurologic:  Sensation intact distally Lymphatic:  No axillary or cervical lymphadenopathy  Orthopedic Exam:  Orthopedic examination is limited to the left wrist and hand.  There is moderate swelling throughout the hand extending into the distal forearm region with overlying erythema in the dorsal hand and wrist region.  There is an area of ballotable fluid over the radial aspect of the wrist in the area of the snuffbox.  There also is an area of swelling/superficial blistering over the dorsal aspect of the thumb MCP joint.  The patient is able to actively flex and extend his fingers with only minimal discomfort, although motion is limited secondary to hand swelling.  He has no tenderness to palpation along the flexor sheaths of any digit.  He has more moderate pain and limited motion at the wrist.  He is neurovascularly intact all digits.  X-rays:  A CT scan of the left wrist is available for review and has been reviewed by myself.  The scan demonstrates a 1 x 2 x 3 cm abscess in the subcutaneous tissues overlying the radial aspect of the wrist at the base of the thumb, consistent with the fluid-filled mass noted on examination.  There also is evidence of tenosynovitis in the first dorsal compartment.  No other areas of discrete fluid collections are identified.  No bony abnormalities are  noted.  Assessment: Subcutaneous abscess of radial aspect of left wrist with overlying cellulitis.  Plan: The treatment options have been discussed with the patient, including both surgical and nonsurgical choices.  The patient would like to proceed with surgical intervention to include an irrigation and debridement of the abscess.  This procedure has been discussed in detail with the patient, as have the potential risks (including bleeding, infection, nerve or blood vessel injury, persistent or recurrent infection, persistent or recurrent pain, need for further surgery, blood clots, strokes, heart attacks and/or arrhythmias, etc.) and benefits.  The patient states his understanding and would like to proceed.  A formal written consent has been obtained.  Thank you for asking me to participate in the care of this most unfortunate man.  I will be happy to follow him with you.   Maryagnes Amos, MD  Beeper #:  [phone]  03/04/2020 1:04 PM

## 2020-03-04 NOTE — ED Notes (Addendum)
Surgeon at bedside. Pt signed consent for procedure. Lunch tray discarded. Pt had not touched lunch tray at this time. Last meal was breakfast.

## 2020-03-04 NOTE — ED Notes (Signed)
CBG 284 

## 2020-03-04 NOTE — Progress Notes (Signed)
PROGRESS NOTE  AZAREL BANNER JSE:[id number] DOB: 09/25/1990 DOA: 03/03/2020 PCP: Rolm Gala, NP  HPI/Recap of past 24 hours: HPI: The patient is a 29 yr old man who is an IVDU and also carries a diagnosis of DM I. He is non-compliant with his insulin usage, and is known to frequently present with celluliltis and DKA for these reasons. The redness began 3-4 days ago. Now the redness is spreading up his forearm. The hand is swollen, erythematous, and tender to touch. The patient is able to move his fingers, but cannot close his fist. He has had fevers and chills. He also is having frequent urination and feelings of weakness.  [facility] were consulted to admit the patient for further evaluation and treatment. He has been started on Zyvox. Blood cultures x 2 have been drawn. He will be admitted to step down status for glucose treatment.  03/04/20: Seen and examined in the ED.  Reports significant left upper extremity pain and swelling at the level of his cellulitis.  Reports heroin IV drug abuse 1 g per use.  Currently on IV antibiotics empirically, Zyvox.  Assessment/Plan: Principal Problem:   Cellulitis of left hand Active Problems:   Diabetes mellitus type 1, uncontrolled (HCC)   Homelessness   Malnutrition of moderate degree (HCC)   Type 1 diabetes mellitus with hyperglycemia (HCC)   Cocaine abuse (HCC)   MRSA carrier   Polysubstance abuse (HCC)   SIRS (systemic inflammatory response syndrome) (HCC)  Left upper extremity cellulitis with abscess and suspected tenosynovitis, POA X-ray of the hand showed subcutaneous edema Likely soft tissue abscess seen on CT scan left hand with findings likely consistent with tenosynovitis Continue IV antibiotics empirically, Zyvox Blood cultures negative to date, continue to follow cultures Persistent leukocytosis Obtain MRSA screen Monitor fever curve and WBC Hand surgery consult Pain control and bowel regimen  Mild  hypovolemic hyponatremia Presented with serum sodium 132 Currently on IV fluid normal saline at 75 cc/h Repeat BMP in the morning  Hyperosmotic non-ketotic hyperglycemia:  Secondary to medication noncompliance.   Hemoglobin A1c 11.1 on 03/03/2020 Continue subcu insulin  IV drug abuse with heroin Polysubstance abuse cessation counseling at bedside  Homelessness: Complicates all issues.  Cachexia/malnutrition: Consult nutrition   DVT prophylaxis: Lovenox subcu daily CODE STATUS: Full Code Family communication: None at bedside.  Status is: Inpatient    Dispo: The patient is from: Home.               Anticipated d/c is to: Home.               Anticipated d/c date is: 03/07/2020              Patient currently not stable for discharge due to ongoing treatment for presumed left tenosynovitis with abscess.        Objective: Vitals:   03/04/20 0800 03/04/20 0830 03/04/20 0930 03/04/20 1149  BP: (!) 149/108 (!) 154/102 131/82 (!) 150/94  Pulse:   64 72  Resp: 18 (!) 21 16 16   Temp:      TempSrc:      SpO2:   98% 98%  Weight:      Height:        Intake/Output Summary (Last 24 hours) at 03/04/2020 1221 Last data filed at 03/04/2020 1156 Gross per 24 hour  Intake 2411.14 ml  Output 3100 ml  Net -688.86 ml   Filed Weights   03/03/20 1355  Weight: 59 kg  Exam:  . General: 29 y.o. year-old male well developed well nourished in no acute distress.  Alert and oriented x3. . Cardiovascular: Regular rate and rhythm with no rubs or gallops.  No thyromegaly or JVD noted.   Marland Kitchen. Respiratory: Clear to auscultation with no wheezes or rales. Good inspiratory effort. . Abdomen: Soft nontender nondistended with normal bowel sounds x4 quadrants. . Musculoskeletal: No lower extremity edema bilaterally. . Skin: Erythematous, warm and edematous left wrist. . Psychiatry: Mood is appropriate for condition and setting   Data Reviewed: CBC: Recent Labs  Lab 03/03/20 1357  03/03/20 1913 03/04/20 0448  WBC 17.3* 17.4* 17.6*  NEUTROABS 13.5*  --   --   HGB 11.1* 10.3* 10.1*  HCT 34.7* 31.2* 28.8*  MCV 88.3 86.2 82.8  PLT 310 251 263   Basic Metabolic Panel: Recent Labs  Lab 03/03/20 1357 03/03/20 1913 03/04/20 0240 03/04/20 0448 03/04/20 0643  NA 125* 133* 135 133* 132*  K 5.3* 3.7 4.3 4.0 3.8  CL 91* 96* 98 98 97*  CO2 23 28 28 26 27   GLUCOSE 871* 442* 149* 160* 145*  BUN 27* 23* 23* 21* 18  CREATININE 1.52* 1.07 0.78 0.76 0.74  CALCIUM 8.5* 8.5* 8.4* 8.4* 8.3*  MG  --  1.9  --   --   --    GFR: Estimated Creatinine Clearance: 115.7 mL/min (by C-G formula based on SCr of 0.74 mg/dL). Liver Function Tests: Recent Labs  Lab 03/03/20 1357  AST 36  ALT 48*  ALKPHOS 120  BILITOT 1.1  PROT 7.6  ALBUMIN 3.8   No results for input(s): LIPASE, AMYLASE in the last 168 hours. No results for input(s): AMMONIA in the last 168 hours. Coagulation Profile: Recent Labs  Lab 03/03/20 1357  INR 1.0   Cardiac Enzymes: No results for input(s): CKTOTAL, CKMB, CKMBINDEX, TROPONINI in the last 168 hours. BNP (last 3 results) No results for input(s): PROBNP in the last 8760 hours. HbA1C: Recent Labs    03/03/20 1913  HGBA1C 11.1*   CBG: Recent Labs  Lab 03/04/20 0351 03/04/20 0452 03/04/20 0647 03/04/20 0819 03/04/20 1153  GLUCAP 159* 158* 142* 118* 284*   Lipid Profile: No results for input(s): CHOL, HDL, LDLCALC, TRIG, CHOLHDL, LDLDIRECT in the last 72 hours. Thyroid Function Tests: No results for input(s): TSH, T4TOTAL, FREET4, T3FREE, THYROIDAB in the last 72 hours. Anemia Panel: No results for input(s): VITAMINB12, FOLATE, FERRITIN, TIBC, IRON, RETICCTPCT in the last 72 hours. Urine analysis:    Component Value Date/Time   COLORURINE YELLOW (A) 03/03/2020 1613   APPEARANCEUR HAZY (A) 03/03/2020 1613   APPEARANCEUR Clear 07/08/2019 1825   LABSPEC 1.028 03/03/2020 1613   LABSPEC 1.026 04/06/2014 0751   PHURINE 5.0 03/03/2020  1613   GLUCOSEU >=500 (A) 03/03/2020 1613   GLUCOSEU >=500 04/06/2014 0751   HGBUR MODERATE (A) 03/03/2020 1613   BILIRUBINUR NEGATIVE 03/03/2020 1613   BILIRUBINUR Negative 07/08/2019 1825   BILIRUBINUR Negative 04/06/2014 0751   KETONESUR NEGATIVE 03/03/2020 1613   PROTEINUR NEGATIVE 03/03/2020 1613   NITRITE NEGATIVE 03/03/2020 1613   LEUKOCYTESUR NEGATIVE 03/03/2020 1613   LEUKOCYTESUR Negative 04/06/2014 0751   Sepsis Labs: @LABRCNTIP (procalcitonin:4,lacticidven:4)  ) Recent Results (from the past 240 hour(s))  Culture, blood (Routine x 2)     Status: None (Preliminary result)   Collection Time: 03/03/20  2:02 PM   Specimen: BLOOD  Result Value Ref Range Status   Specimen Description BLOOD BLOOD RIGHT FOREARM  Final   Special Requests  Final    BOTTLES DRAWN AEROBIC AND ANAEROBIC Blood Culture adequate volume   Culture   Final    NO GROWTH < 24 HOURS Performed at [facility], [address] [place], [place] [address]    Report Status PENDING  Incomplete  Culture, blood (Routine x 2)     Status: None (Preliminary result)   Collection Time: 03/03/20  4:13 PM   Specimen: BLOOD  Result Value Ref Range Status   Specimen Description BLOOD RIGHT ANTECUBITAL  Final   Special Requests   Final    BOTTLES DRAWN AEROBIC AND ANAEROBIC Blood Culture adequate volume   Culture   Final    NO GROWTH < 24 HOURS Performed at [facility], [address], [place], [place] [address]    Report Status PENDING  Incomplete  SARS Coronavirus 2 by RT PCR (hospital order, performed in [facility] hospital lab) Nasopharyngeal Nasopharyngeal Swab     Status: None   Collection Time: 03/03/20  5:10 PM   Specimen: Nasopharyngeal Swab  Result Value Ref Range Status   SARS Coronavirus 2 NEGATIVE NEGATIVE Final    Comment: (NOTE) SARS-CoV-2 target nucleic acids are NOT DETECTED.  The SARS-CoV-2 RNA is generally detectable in upper and lower respiratory specimens during the  acute phase of infection. The lowest concentration of SARS-CoV-2 viral copies this assay can detect is 250 copies / mL. A negative result does not preclude SARS-CoV-2 infection and should not be used as the sole basis for treatment or other patient management decisions.  A negative result may occur with improper specimen collection / handling, submission of specimen other than nasopharyngeal swab, presence of viral mutation(s) within the areas targeted by this assay, and inadequate number of viral copies (<250 copies / mL). A negative result must be combined with clinical observations, patient history, and epidemiological information.  Fact Sheet for Patients:   BoilerBrush.com.cy  Fact Sheet for Healthcare Providers: https://pope.com/  This test is not yet approved or  cleared by the [place] FDA and has been authorized for detection and/or diagnosis of SARS-CoV-2 by FDA under an Emergency Use Authorization (EUA).  This EUA will remain in effect (meaning this test can be used) for the duration of the COVID-19 declaration under Section 564(b)(1) of the Act, 21 U.S.C. section 360bbb-3(b)(1), unless the authorization is terminated or revoked sooner.  Performed at [facility], [address] [place] [place], [place] [address]       Studies: DG Chest 2 View  Result Date: 03/03/2020 CLINICAL DATA:  Sepsis EXAM: CHEST - 2 VIEW COMPARISON:  September 12, 2019 FINDINGS: The cardiomediastinal silhouette is normal in contour. No pleural effusion. No pneumothorax. No acute pleuroparenchymal abnormality. Visualized abdomen is unremarkable. No acute osseous abnormality noted. IMPRESSION: No acute cardiopulmonary abnormality. Electronically Signed   By: Meda Klinefelter MD   On: 03/03/2020 14:21   CT HAND LEFT W CONTRAST  Result Date: 03/03/2020 CLINICAL DATA:  Left-handed pain erythema IV drug abuse EXAM: CT OF THE UPPER LEFT EXTREMITY  WITHOUT CONTRAST TECHNIQUE: Multidetector CT imaging of the upper left extremity was performed according to the standard protocol. COMPARISON:  Radiograph same day FINDINGS: Bones/Joint/Cartilage No fracture or dislocation. The articular surfaces appear to be maintained. No large joint effusions are noted. Ligaments Suboptimally assessed by CT. Muscles and Tendons There is a multilocular fluid collection seen overlying the radial aspect of the wrist at the base of the first [facility] joint measuring 2.1 x 0.9 x 3.2 cm. The collection  appears to abut surface of the extensor compartment 1 which as peripheral enhancement and a small amount of fluid seen within it. There is diffuse overlying edema containing within today myotendinous junction of the abductor pollicis longus. The remainder of the flexor and extensor tendons appear to be intact. Soft tissues As described above multilocular fluid collection overlying extensor compartment 1. There is diffuse skin thickening and subcutaneous edema seen around the radial aspect of the wrist. No subcutaneous emphysema is seen. IMPRESSION: Multilocular collection, likely soft tissue abscess overlying the radial aspect of the wrist measuring 2.1x0.9 x 3.2 cm. The collection abuts the surface of the extensor compartment 1 where there is findings likely consistent with tenosynovitis, infectious or inflammatory. Electronically Signed   By: Jonna Clark M.D.   On: 03/03/2020 18:49   DG Hand Complete Left  Result Date: 03/03/2020 CLINICAL DATA:  Left hand pain, erythema EXAM: LEFT HAND - COMPLETE 3+ VIEW COMPARISON:  None. FINDINGS: Normal alignment. No fracture or dislocation. No destructive osseous lesion. No retained radiopaque foreign body. Diffuse soft tissue swelling is noted of the left hand and visualized left wrist. IMPRESSION: Soft tissue swelling without acute osseous abnormality. Electronically Signed   By: Helyn Numbers MD   On: 03/03/2020 17:19    Scheduled Meds: .  enoxaparin (LOVENOX) injection  40 mg Subcutaneous Q24H  . insulin aspart  0-15 Units Subcutaneous TID AC & HS  . insulin glargine  10 Units Subcutaneous Q24H  . senna-docusate  2 tablet Oral BID    Continuous Infusions: . dextrose 5% lactated ringers Stopped (03/04/20 0712)  . insulin Stopped (03/04/20 0900)  . lactated ringers Stopped (03/04/20 0349)  . linezolid (ZYVOX) IV 600 mg (03/04/20 1209)     LOS: 1 day     Darlin Drop, MD [facility] Pager [phone]  If 7PM-7AM, please contact night-coverage [email or web address] Password [facility] 03/04/2020, 12:21 PM

## 2020-03-04 NOTE — Anesthesia Procedure Notes (Signed)
Procedure Name: LMA Insertion Date/Time: 03/04/2020 4:27 PM Performed by: Estanislado Emms, CRNA Pre-anesthesia Checklist: Patient identified, Patient being monitored, Timeout performed, Emergency Drugs available and Suction available Patient Re-evaluated:Patient Re-evaluated prior to induction Oxygen Delivery Method: Circle system utilized Preoxygenation: Pre-oxygenation with 100% oxygen Induction Type: IV induction Ventilation: Mask ventilation without difficulty LMA: LMA inserted LMA Size: 3.5 Tube type: Oral Number of attempts: 1 Placement Confirmation: positive ETCO2 and breath sounds checked- equal and bilateral Dental Injury: Teeth and Oropharynx as per pre-operative assessment

## 2020-03-04 NOTE — ED Notes (Signed)
Pt asking for next dose of oxy. Pt informed dose was increased to 15mg  q 3hr, next dose due at 1212.

## 2020-03-04 NOTE — Transfer of Care (Signed)
Immediate Anesthesia Transfer of Care Note  Patient: Trevor Brown  Procedure(s) Performed: INCISION AND DRAINAGE ABSCESS (Left Hand)  Patient Location: PACU  Anesthesia Type:General  Level of Consciousness: awake, alert  and oriented  Airway & Oxygen Therapy: Patient Spontanous Breathing and Patient connected to nasal cannula oxygen  Post-op Assessment: Report given to RN and Post -op Vital signs reviewed and stable  Post vital signs: Reviewed and stable  Last Vitals:  Vitals Value Taken Time  BP 119/71   Temp 36.2   Pulse 85 03/04/20 1735  Resp 17 03/04/20 1735  SpO2 100 % 03/04/20 1735  Vitals shown include unvalidated device data.  Last Pain:  Vitals:   03/04/20 1349  TempSrc:   PainSc: 7          Complications: No complications documented.

## 2020-03-04 NOTE — Op Note (Signed)
03/04/2020  5:24 PM  Patient:   Trevor Brown  Pre-Op Diagnosis:   Left dorso-radial wrist abscess.  Post-Op Diagnosis:   Same  Procedure:   Irrigation and debridement of left dorsal radial wrist/thumb abscess  Surgeon:   Maryagnes Amos, MD  Assistant:   None  Anesthesia:   General LMA  Findings:   As above.  The abscess tracked subcutaneously up over the dorsum of the thumb to the level of the proximal phalanx and dorsally over the wrist to the index metacarpal.  Complications:   None  Fluids:   1000 cc crystalloid  EBL:   5 cc  UOP:   None  TT:   37 min at 250 mmHg  Drains:   Penrose x1  Closure:   4-0 Prolene interrupted sutures  Brief Clinical Note:   The patient is a 29 year old male with a history of IV drug abuse who developed dorso-radial left wrist and hand pain, erythema, and swelling approximately 5 days ago after shooting up into the radial wrist region.  He presented to the emergency room yesterday afternoon where he was admitted and treated for cellulitis.  A subsequent CT scan confirmed the presence of an abscess along the radial aspect of his wrist.  He presents at this time for formal irrigation and debridement of the left wrist abscess.  Procedure:   The patient was brought into the operating room and lain in the supine position.  After adequate general laryngeal mask anesthesia was obtained, the patient's left hand and upper extremity were prepped with ChloraPrep solution before being draped sterilely.  Peri-operative antibiotics were being administered.  A timeout was performed to verify the appropriate surgical site before an approximately 6 to 7 cm incision was made along the radial aspect of his thumb beginning at the radial styloid and extending just beyond the thumb MCP joint.  Significant purulent material was immediately encountered and a culture was obtained.  The abscess was explored and was noted to extend extend proximally to the radial styloid  region approximately, distally to the mid-portion of the proximal phalanx of the thumb, dorsally to the base of the index metacarpal, and volarly to the mid radial line of the wrist.  All nonviable and questionably viable tissues were debrided sharply and with a rongeur before the walls of the abscess were gently scraped with a curette.  The abscess was irrigated thoroughly with 1.5 L of sterile saline using bulb irrigation.  A Penrose drain was placed so that one branch of the drain went into the depth of the abscess dorsally and ulnarly whereas the other branch was placed along the thumb metacarpal to the MCP joint.  The wound was closed loosely over the drain using 4-0 Prolene interrupted sutures.  A sterile bulky dressing was applied to the wound before a volar splint was applied to the forearm and hand, maintaining the wrist in neutral position.  The patient was then awakened, extubated, and returned to the recovery room in satisfactory condition after tolerating the procedure well.

## 2020-03-04 NOTE — ED Notes (Signed)
Pt stated pain 7/10 after last dose oxycodone. Noted to be asleep 2 min later.

## 2020-03-04 NOTE — ED Notes (Signed)
Pt resting in bed. Pt states pain 10/10 L hand.  1mg  IV dilaudid given.

## 2020-03-05 ENCOUNTER — Encounter: Payer: Self-pay | Admitting: Surgery

## 2020-03-05 LAB — COMPREHENSIVE METABOLIC PANEL
ALT: 35 U/L (ref 0–44)
AST: 36 U/L (ref 15–41)
Albumin: 2.7 g/dL — ABNORMAL LOW (ref 3.5–5.0)
Alkaline Phosphatase: 95 U/L (ref 38–126)
Anion gap: 8 (ref 5–15)
BUN: 16 mg/dL (ref 6–20)
CO2: 25 mmol/L (ref 22–32)
Calcium: 8 mg/dL — ABNORMAL LOW (ref 8.9–10.3)
Chloride: 98 mmol/L (ref 98–111)
Creatinine, Ser: 1.02 mg/dL (ref 0.61–1.24)
GFR calc Af Amer: >60 mL/min (ref >60)
GFR calc non Af Amer: >60 mL/min (ref >60)
Glucose, Bld: 247 mg/dL — ABNORMAL HIGH (ref 70–99)
Potassium: 3.9 mmol/L (ref 3.5–5.1)
Sodium: 131 mmol/L — ABNORMAL LOW (ref 135–145)
Total Bilirubin: 0.4 mg/dL (ref 0.3–1.2)
Total Protein: 6.2 g/dL — ABNORMAL LOW (ref 6.5–8.1)

## 2020-03-05 LAB — CBC WITH DIFFERENTIAL/PLATELET
Abs Immature Granulocytes: 0.08 10*3/uL — ABNORMAL HIGH (ref 0.00–0.07)
Basophils Absolute: 0.1 10*3/uL (ref 0.0–0.1)
Basophils Relative: 0 %
Eosinophils Absolute: 0.1 10*3/uL (ref 0.0–0.5)
Eosinophils Relative: 1 %
HCT: 30.1 % — ABNORMAL LOW (ref 39.0–52.0)
Hemoglobin: 10.1 g/dL — ABNORMAL LOW (ref 13.0–17.0)
Immature Granulocytes: 1 %
Lymphocytes Relative: 19 %
Lymphs Abs: 2.6 10*3/uL (ref 0.7–4.0)
MCH: 28.5 pg (ref 26.0–34.0)
MCHC: 33.6 g/dL (ref 30.0–36.0)
MCV: 84.8 fL (ref 80.0–100.0)
Monocytes Absolute: 0.7 10*3/uL (ref 0.1–1.0)
Monocytes Relative: 5 %
Neutro Abs: 10 10*3/uL — ABNORMAL HIGH (ref 1.7–7.7)
Neutrophils Relative %: 74 %
Platelets: 271 10*3/uL (ref 150–400)
RBC: 3.55 MIL/uL — ABNORMAL LOW (ref 4.22–5.81)
RDW: 13.9 % (ref 11.5–15.5)
WBC: 13.5 10*3/uL — ABNORMAL HIGH (ref 4.0–10.5)
nRBC: 0 % (ref 0.0–0.2)

## 2020-03-05 LAB — MAGNESIUM: Magnesium: 1.7 mg/dL (ref 1.7–2.4)

## 2020-03-05 LAB — GLUCOSE, CAPILLARY
Glucose-Capillary: 291 mg/dL — ABNORMAL HIGH (ref 70–99)
Glucose-Capillary: 275 mg/dL — ABNORMAL HIGH (ref 70–99)
Glucose-Capillary: 270 mg/dL — ABNORMAL HIGH (ref 70–99)

## 2020-03-05 LAB — PHOSPHORUS: Phosphorus: 1.6 mg/dL — ABNORMAL LOW (ref 2.5–4.6)

## 2020-03-05 LAB — GRAM STAIN

## 2020-03-05 MED ORDER — MAGNESIUM SULFATE 2 GM/50ML IV SOLN
2.0000 g | Freq: Once | INTRAVENOUS | Status: AC
  Administered 2020-03-05: 2 g via INTRAVENOUS
  Filled 2020-03-05: qty 50

## 2020-03-05 MED ORDER — INSULIN ASPART 100 UNIT/ML ~~LOC~~ SOLN
5.0000 [IU] | Freq: Three times a day (TID) | SUBCUTANEOUS | Status: DC
  Administered 2020-03-05 (×3): 5 [IU] via SUBCUTANEOUS
  Filled 2020-03-05 (×3): qty 1

## 2020-03-05 MED ORDER — SODIUM PHOSPHATES 45 MMOLE/15ML IV SOLN
30.0000 mmol | Freq: Once | INTRAVENOUS | Status: AC
  Administered 2020-03-05: 30 mmol via INTRAVENOUS
  Filled 2020-03-05: qty 10

## 2020-03-05 MED ORDER — INSULIN GLARGINE 100 UNIT/ML ~~LOC~~ SOLN
20.0000 [IU] | SUBCUTANEOUS | Status: DC
  Filled 2020-03-05: qty 0.2

## 2020-03-05 MED ORDER — INSULIN GLARGINE 100 UNIT/ML ~~LOC~~ SOLN
20.0000 [IU] | SUBCUTANEOUS | Status: DC
  Administered 2020-03-05: 20 [IU] via SUBCUTANEOUS
  Filled 2020-03-05 (×2): qty 0.2

## 2020-03-05 NOTE — Anesthesia Postprocedure Evaluation (Signed)
Anesthesia Post Note  Patient: Trevor Brown  Procedure(s) Performed: INCISION AND DRAINAGE ABSCESS (Left Hand)  Patient location during evaluation: PACU Anesthesia Type: General Level of consciousness: awake and alert Pain management: pain level controlled Vital Signs Assessment: post-procedure vital signs reviewed and stable Respiratory status: spontaneous breathing, nonlabored ventilation and respiratory function stable Cardiovascular status: blood pressure returned to baseline and stable Postop Assessment: no apparent nausea or vomiting Anesthetic complications: no   No complications documented.   Last Vitals:  Vitals:   03/05/20 0436 03/05/20 0845  BP: 130/83 (!) 154/99  Pulse: 68 64  Resp: 20 20  Temp: 37.3 C 37.1 C  SpO2: 99% 100%    Last Pain:  Vitals:   03/05/20 0845  TempSrc: Oral  PainSc:                  Christia Reading

## 2020-03-05 NOTE — Progress Notes (Signed)
PROGRESS NOTE  Trevor Brown GMW:[id number] DOB: 15-Dec-1990 DOA: 03/03/2020 PCP: Rolm Gala, NP  HPI/Recap of past 24 hours: HPI: The patient is a 29 yr old man who is an IVDU and also carries a diagnosis of DM I. He is non-compliant with his insulin usage, and is known to frequently present with celluliltis and DKA for these reasons. The redness began 3-4 days ago. Now the redness is spreading up his forearm. The hand is swollen, erythematous, and tender to touch. The patient is able to move his fingers, but cannot close his fist. He has had fevers and chills. He also is having frequent urination and feelings of weakness.  [facility] were consulted to admit the patient for further evaluation and treatment. He has been started on Zyvox. Blood cultures x 2 have been drawn. He will be admitted to step down status for glucose treatment.  03/05/20: Seen and examined at his bedside.  POD #1 post I&D of left wrist abscess.  His pain is currently well controlled.  Deep wound Gram stain growing rare gram-positive cocci.  On Zyvox.  Assessment/Plan: Principal Problem:   Cellulitis of left hand Active Problems:   Diabetes mellitus type 1, uncontrolled (HCC)   Homelessness   Malnutrition of moderate degree (HCC)   Type 1 diabetes mellitus with hyperglycemia (HCC)   Cocaine abuse (HCC)   MRSA carrier   Polysubstance abuse (HCC)   SIRS (systemic inflammatory response syndrome) (HCC)  Left upper extremity cellulitis with abscess and suspected tenosynovitis, POA POD #1 post I&D by Dr. Joice Lofts on 10/03/2019 X-ray of the hand showed subcutaneous edema Likely soft tissue abscess seen on CT scan left hand with findings likely consistent with tenosynovitis Blood cultures negative to date, continue to follow cultures Persistent leukocytosis, improving MRSA screen negative Deep wound Gram stain growing rare gram-positive cocci.  On Zyvox. Continue to follow wound cultures. Monitor fever  curve and WBC Hand surgery following Pain control and bowel regimen in place  Hypomagnesemia Serum magnesium 1.7 Repleted intravenously  Hypophosphatemia Serum phosphorus 1.6 Repleted intravenously  Mild hypovolemic hyponatremia, improving Presented with serum sodium 132 Repeat BMP in the morning  Hyperosmotic non-ketotic hyperglycemia:  Secondary to medication noncompliance.   Hemoglobin A1c 11.1 on 03/03/2020 Continue subcu insulin  IV drug abuse with heroin Polysubstance abuse cessation counseling at bedside  Homelessness: Complicates all issues.  Cachexia/malnutrition: Consult nutrition   DVT prophylaxis: Lovenox subcu daily CODE STATUS: Full Code Family communication: None at bedside.  Status is: Inpatient    Dispo: The patient is from: Home.               Anticipated d/c is to: Home.               Anticipated d/c date is: 03/07/2020              Patient currently not stable for discharge due to ongoing treatment for left tenosynovitis with abscess.        Objective: Vitals:   03/05/20 0005 03/05/20 0436 03/05/20 0845 03/05/20 1140  BP: 129/81 130/83 (!) 154/99 (!) 146/95  Pulse:  68 64 64  Resp:  20 20 20   Temp: 99.1 F (37.3 C) 99.1 F (37.3 C) 98.8 F (37.1 C) 98.8 F (37.1 C)  TempSrc: Oral Oral Oral Oral  SpO2:  99% 100% 99%  Weight:      Height:        Intake/Output Summary (Last 24 hours) at 03/05/2020 1502 Last data filed  at 03/05/2020 1300 Gross per 24 hour  Intake 2773.89 ml  Output 2500 ml  Net 273.89 ml   Filed Weights   03/03/20 1355  Weight: 59 kg    Exam:   General: 29 y.o. year-old male well-developed and nourished in no acute distress.  Alert oriented x3.    Cardiovascular: Regular rate and rhythm no rubs or gallops.    Respiratory: Clear to auscultation no wheezes or rales.  Abdomen: Soft nontender normal bowel sounds present   musculoskeletal: No extremity edema bilaterally   skin: Left wrist in surgical  dressing  Psychiatry: Mood is appropriate for Condition and Setting   Data Reviewed: CBC: Recent Labs  Lab 03/03/20 1357 03/03/20 1913 03/04/20 0448 03/05/20 1304  WBC 17.3* 17.4* 17.6* 13.5*  NEUTROABS 13.5*  --   --  10.0*  HGB 11.1* 10.3* 10.1* 10.1*  HCT 34.7* 31.2* 28.8* 30.1*  MCV 88.3 86.2 82.8 84.8  PLT 310 251 263 271   Basic Metabolic Panel: Recent Labs  Lab 03/03/20 1913 03/03/20 1913 03/04/20 0240 03/04/20 0448 03/04/20 0643 03/04/20 1348 03/05/20 1304  NA 133*   < > 135 133* 132* 128* 131*  K 3.7   < > 4.3 4.0 3.8 3.8 3.9  CL 96*   < > 98 98 97* 95* 98  CO2 28   < > 28 26 27 25 25   GLUCOSE 442*   < > 149* 160* 145* 214* 247*  BUN 23*   < > 23* 21* 18 15 16   CREATININE 1.07   < > 0.78 0.76 0.74 0.76 1.02  CALCIUM 8.5*   < > 8.4* 8.4* 8.3* 8.1* 8.0*  MG 1.9  --   --   --   --   --  1.7  PHOS  --   --   --   --   --   --  1.6*   < > = values in this interval not displayed.   GFR: Estimated Creatinine Clearance: 90.8 mL/min (by C-G formula based on SCr of 1.02 mg/dL). Liver Function Tests: Recent Labs  Lab 03/03/20 1357 03/05/20 1304  AST 36 36  ALT 48* 35  ALKPHOS 120 95  BILITOT 1.1 0.4  PROT 7.6 6.2*  ALBUMIN 3.8 2.7*   No results for input(s): LIPASE, AMYLASE in the last 168 hours. No results for input(s): AMMONIA in the last 168 hours. Coagulation Profile: Recent Labs  Lab 03/03/20 1357  INR 1.0   Cardiac Enzymes: No results for input(s): CKTOTAL, CKMB, CKMBINDEX, TROPONINI in the last 168 hours. BNP (last 3 results) No results for input(s): PROBNP in the last 8760 hours. HbA1C: Recent Labs    03/03/20 1913  HGBA1C 11.1*   CBG: Recent Labs  Lab 03/04/20 0819 03/04/20 1153 03/04/20 1741 03/04/20 2141 03/05/20 0806  GLUCAP 118* 284* 234* 515* 291*   Lipid Profile: No results for input(s): CHOL, HDL, LDLCALC, TRIG, CHOLHDL, LDLDIRECT in the last 72 hours. Thyroid Function Tests: No results for input(s): TSH, T4TOTAL,  FREET4, T3FREE, THYROIDAB in the last 72 hours. Anemia Panel: No results for input(s): VITAMINB12, FOLATE, FERRITIN, TIBC, IRON, RETICCTPCT in the last 72 hours. Urine analysis:    Component Value Date/Time   COLORURINE YELLOW (A) 03/03/2020 1613   APPEARANCEUR HAZY (A) 03/03/2020 1613   APPEARANCEUR Clear 07/08/2019 1825   LABSPEC 1.028 03/03/2020 1613   LABSPEC 1.026 04/06/2014 0751   PHURINE 5.0 03/03/2020 1613   GLUCOSEU >=500 (A) 03/03/2020 1613   GLUCOSEU >=500 04/06/2014  0751   HGBUR MODERATE (A) 03/03/2020 1613   BILIRUBINUR NEGATIVE 03/03/2020 1613   BILIRUBINUR Negative 07/08/2019 1825   BILIRUBINUR Negative 04/06/2014 0751   KETONESUR NEGATIVE 03/03/2020 1613   PROTEINUR NEGATIVE 03/03/2020 1613   NITRITE NEGATIVE 03/03/2020 1613   LEUKOCYTESUR NEGATIVE 03/03/2020 1613   LEUKOCYTESUR Negative 04/06/2014 0751   Sepsis Labs: @LABRCNTIP (procalcitonin:4,lacticidven:4)  ) Recent Results (from the past 240 hour(s))  Culture, blood (Routine x 2)     Status: None (Preliminary result)   Collection Time: 03/03/20  2:02 PM   Specimen: BLOOD  Result Value Ref Range Status   Specimen Description BLOOD BLOOD RIGHT FOREARM  Final   Special Requests   Final    BOTTLES DRAWN AEROBIC AND ANAEROBIC Blood Culture adequate volume   Culture   Final    NO GROWTH 2 DAYS Performed at [facility], [address] [place], [place] [address]    Report Status PENDING  Incomplete  Culture, blood (Routine x 2)     Status: None (Preliminary result)   Collection Time: 03/03/20  4:13 PM   Specimen: BLOOD  Result Value Ref Range Status   Specimen Description BLOOD RIGHT ANTECUBITAL  Final   Special Requests   Final    BOTTLES DRAWN AEROBIC AND ANAEROBIC Blood Culture adequate volume   Culture   Final    NO GROWTH 2 DAYS Performed at [facility], [address] [place], [place] [address]    Report Status PENDING  Incomplete  SARS Coronavirus 2 by RT PCR (hospital  order, performed in [facility] lab) Nasopharyngeal Nasopharyngeal Swab     Status: None   Collection Time: 03/03/20  5:10 PM   Specimen: Nasopharyngeal Swab  Result Value Ref Range Status   SARS Coronavirus 2 NEGATIVE NEGATIVE Final    Comment: (NOTE) SARS-CoV-2 target nucleic acids are NOT DETECTED.  The SARS-CoV-2 RNA is generally detectable in upper and lower respiratory specimens during the acute phase of infection. The lowest concentration of SARS-CoV-2 viral copies this assay can detect is 250 copies / mL. A negative result does not preclude SARS-CoV-2 infection and should not be used as the sole basis for treatment or other patient management decisions.  A negative result may occur with improper specimen collection / handling, submission of specimen other than nasopharyngeal swab, presence of viral mutation(s) within the areas targeted by this assay, and inadequate number of viral copies (<250 copies / mL). A negative result must be combined with clinical observations, patient history, and epidemiological information.  Fact Sheet for Patients:   BoilerBrush.com.cyhttps://www.fda.gov/media/136312/download  Fact Sheet for Healthcare Providers: https://pope.com/https://www.fda.gov/media/136313/download  This test is not yet approved or  cleared by the [place] FDA and has been authorized for detection and/or diagnosis of SARS-CoV-2 by FDA under an Emergency Use Authorization (EUA).  This EUA will remain in effect (meaning this test can be used) for the duration of the COVID-19 declaration under Section 564(b)(1) of the Act, 21 U.S.C. section 360bbb-3(b)(1), unless the authorization is terminated or revoked sooner.  Performed at [facility], [address] [place], [place] [address]   MRSA PCR Screening     Status: None   Collection Time: 03/04/20  1:48 PM   Specimen: Nasal Mucosa; Nasopharyngeal  Result Value Ref Range Status   MRSA by PCR NEGATIVE NEGATIVE Final    Comment:         The GeneXpert MRSA Assay (FDA approved for NASAL specimens only), is one component of a comprehensive  MRSA colonization surveillance program. It is not intended to diagnose MRSA infection nor to guide or monitor treatment for MRSA infections. Performed at [facility], [address] [place], [place] [address]   Gram stain     Status: None (Preliminary result)   Collection Time: 03/04/20  4:44 PM   Specimen: PATH Other; Tissue  Result Value Ref Range Status   Specimen Description WOUND  Final   Special Requests NONE  Final   Gram Stain   Final    GRAM POSITIVE COCCI WBC SEEN MANY Performed at [facility], [address] [place], [place] [address]    Report Status PENDING  Incomplete  Aerobic/Anaerobic Culture (surgical/deep wound)     Status: None (Preliminary result)   Collection Time: 03/04/20  4:44 PM   Specimen: PATH Other; Tissue  Result Value Ref Range Status   Specimen Description   Final    WOUND Performed at [facility], [address], [place], [place] [address]    Special Requests   Final    NONE Performed at [facility], [address] [place], [place] [address]    Gram Stain   Final    FEW WBC PRESENT, PREDOMINANTLY PMN RARE GRAM POSITIVE COCCI    Culture   Final    TOO YOUNG TO READ Performed at [facility], [address] [place], [place] [address]    Report Status PENDING  Incomplete      Studies: No results found.  Scheduled Meds:  docusate sodium  100 mg Oral BID   enoxaparin (LOVENOX) injection  40 mg Subcutaneous Q24H   famotidine  20 mg Oral BID   gabapentin  400 mg Oral QHS   insulin aspart  0-15 Units Subcutaneous TID AC & HS   insulin aspart  5 Units Subcutaneous TID WC   insulin glargine  20 Units Subcutaneous Q24H   senna-docusate  2 tablet Oral BID    Continuous Infusions:  lactated ringers 125 mL/hr at 03/05/20 0658   linezolid (ZYVOX) IV 600 mg (03/05/20  1203)     LOS: 2 days     Darlin Drop, MD [facility] Pager [phone]  If 7PM-7AM, please contact night-coverage [email or web address] Password [facility] 03/05/2020, 3:02 PM

## 2020-03-05 NOTE — Progress Notes (Signed)
Subjective: 1 Day Post-Op Procedure(s) (LRB): INCISION AND DRAINAGE ABSCESS (Left) Patient reports pain as 8 on 0-10 scale.   Patient is well this AM, bulky dressing applied to the left hand. Care management to assist with discharge planning, plan is for discharge home. Negative for chest pain and shortness of breath Fever: No recent fever Gastrointestinal:Negative for nausea and vomiting  Objective: Vital signs in last 24 hours: Temp:  [97.5 F (36.4 C)-99.1 F (37.3 C)] 98.8 F (37.1 C) (08/15 0845) Pulse Rate:  [57-82] 64 (08/15 0845) Resp:  [12-23] 20 (08/15 0845) BP: (119-154)/(71-104) 154/99 (08/15 0845) SpO2:  [98 %-100 %] 100 % (08/15 0845)  Intake/Output from previous day:  Intake/Output Summary (Last 24 hours) at 03/05/2020 0858 Last data filed at 03/05/2020 0515 Gross per 24 hour  Intake 2604.87 ml  Output 3700 ml  Net -1095.13 ml    Intake/Output this shift: No intake/output data recorded.  Labs: Recent Labs    03/03/20 1357 03/03/20 1913 03/04/20 0448  HGB 11.1* 10.3* 10.1*   Recent Labs    03/03/20 1913 03/04/20 0448  WBC 17.4* 17.6*  RBC 3.62* 3.48*  HCT 31.2* 28.8*  PLT 251 263   Recent Labs    03/04/20 0643 03/04/20 1348  NA 132* 128*  K 3.8 3.8  CL 97* 95*  CO2 27 25  BUN 18 15  CREATININE 0.74 0.76  GLUCOSE 145* 214*  CALCIUM 8.3* 8.1*   Recent Labs    03/03/20 1357  INR 1.0     EXAM General - Patient is Alert, Appropriate and Oriented Extremity - Bulky dressing applied to the left hand. Dressing was removed, skin exam shows continued swelling to the left hand however no pocket of fluid is visualized. Bloody and purulent drainage noted from the incision site, the Penrose drain was advanced approx 1 cm. Patient is able to flex and extend fingers with mild discomfort and limited motion due to swelling. Intact to light touch. Cap refill intact to all digits. Dressing/Incision - New bulky dressing was applied after  advancement of the Penrose drain. Motor Function - intact, moving foot and toes well on exam.  Abdomen soft with normal bowel sounds.  Past Medical History:  Diagnosis Date  . Heart attack (HCC)    Pt claims he had heart attack a year ago ( 2015)- and was admitted in [facility] for that, but not given any meds or angiogram, on review of chart- I could not find any details like that.  . Hepatitis C, acute may 2016  . Heroin abuse (HCC)   . History of noncompliance with medical treatment   . Hypertension   . Type 1 diabetes (HCC)     Assessment/Plan: 1 Day Post-Op Procedure(s) (LRB): INCISION AND DRAINAGE ABSCESS (Left) Principal Problem:   Cellulitis of left hand Active Problems:   Diabetes mellitus type 1, uncontrolled (HCC)   Homelessness   Malnutrition of moderate degree (HCC)   Type 1 diabetes mellitus with hyperglycemia (HCC)   Cocaine abuse (HCC)   MRSA carrier   Polysubstance abuse (HCC)   SIRS (systemic inflammatory response syndrome) (HCC)  Estimated body mass index is 22.31 kg/m as calculated from the following:   Height as of this encounter: 5\' 4"  (1.626 m).   Weight as of this encounter: 59 kg. Advance diet   Awaiting on results from 8/15 CBC to determine if WBC has decreased. Penrose drain advanced 1 cm this AM, will advance drain again tomorrow morning. Continue with IV abx until  cultures return.  Will remain in bulky dressing at this time. Cultures pending at this time from surgery, patient was on IV abx prior to surgery.  DVT Prophylaxis - Lovenox Minimal weightbearing to the left hand.  Valeria Batman, PA-C [facility] 03/05/2020, 8:58 AM

## 2020-03-06 LAB — CBC WITH DIFFERENTIAL/PLATELET
Abs Immature Granulocytes: 0.04 10*3/uL (ref 0.00–0.07)
Basophils Absolute: 0 10*3/uL (ref 0.0–0.1)
Basophils Relative: 0 %
Eosinophils Absolute: 0.1 10*3/uL (ref 0.0–0.5)
Eosinophils Relative: 1 %
HCT: 30.6 % — ABNORMAL LOW (ref 39.0–52.0)
Hemoglobin: 10.3 g/dL — ABNORMAL LOW (ref 13.0–17.0)
Immature Granulocytes: 0 %
Lymphocytes Relative: 22 %
Lymphs Abs: 2.4 10*3/uL (ref 0.7–4.0)
MCH: 28.5 pg (ref 26.0–34.0)
MCHC: 33.7 g/dL (ref 30.0–36.0)
MCV: 84.5 fL (ref 80.0–100.0)
Monocytes Absolute: 0.6 10*3/uL (ref 0.1–1.0)
Monocytes Relative: 5 %
Neutro Abs: 7.7 10*3/uL (ref 1.7–7.7)
Neutrophils Relative %: 72 %
Platelets: 279 10*3/uL (ref 150–400)
RBC: 3.62 MIL/uL — ABNORMAL LOW (ref 4.22–5.81)
RDW: 13.8 % (ref 11.5–15.5)
WBC: 10.9 10*3/uL — ABNORMAL HIGH (ref 4.0–10.5)
nRBC: 0 % (ref 0.0–0.2)

## 2020-03-06 LAB — BASIC METABOLIC PANEL
Anion gap: 8 (ref 5–15)
BUN: 17 mg/dL (ref 6–20)
CO2: 26 mmol/L (ref 22–32)
Calcium: 8.2 mg/dL — ABNORMAL LOW (ref 8.9–10.3)
Chloride: 96 mmol/L — ABNORMAL LOW (ref 98–111)
Creatinine, Ser: 0.76 mg/dL (ref 0.61–1.24)
GFR calc Af Amer: >60 mL/min (ref >60)
GFR calc non Af Amer: >60 mL/min (ref >60)
Glucose, Bld: 397 mg/dL — ABNORMAL HIGH (ref 70–99)
Potassium: 4.4 mmol/L (ref 3.5–5.1)
Sodium: 130 mmol/L — ABNORMAL LOW (ref 135–145)

## 2020-03-06 LAB — GLUCOSE, CAPILLARY
Glucose-Capillary: 271 mg/dL — ABNORMAL HIGH (ref 70–99)
Glucose-Capillary: 366 mg/dL — ABNORMAL HIGH (ref 70–99)
Glucose-Capillary: 296 mg/dL — ABNORMAL HIGH (ref 70–99)
Glucose-Capillary: 227 mg/dL — ABNORMAL HIGH (ref 70–99)
Glucose-Capillary: 243 mg/dL — ABNORMAL HIGH (ref 70–99)

## 2020-03-06 MED ORDER — INSULIN ASPART 100 UNIT/ML ~~LOC~~ SOLN
7.0000 [IU] | Freq: Three times a day (TID) | SUBCUTANEOUS | Status: DC
  Administered 2020-03-06 (×3): 7 [IU] via SUBCUTANEOUS
  Filled 2020-03-06 (×3): qty 1

## 2020-03-06 MED ORDER — LINEZOLID 600 MG PO TABS
600.0000 mg | ORAL_TABLET | Freq: Two times a day (BID) | ORAL | Status: DC
  Administered 2020-03-06 – 2020-03-08 (×4): 600 mg via ORAL
  Filled 2020-03-06 (×5): qty 1

## 2020-03-06 MED ORDER — INSULIN GLARGINE 100 UNIT/ML ~~LOC~~ SOLN
25.0000 [IU] | SUBCUTANEOUS | Status: DC
  Administered 2020-03-06: 25 [IU] via SUBCUTANEOUS
  Filled 2020-03-06 (×2): qty 0.25

## 2020-03-06 NOTE — Progress Notes (Signed)
Subjective: 2 Days Post-Op Procedure(s) (LRB): INCISION AND DRAINAGE ABSCESS (Left) Patient reports pain as 7 out of 10  Patient is well this AM, bulky dressing applied to the left hand. Care management to assist with discharge planning, plan is for discharge home. Negative for chest pain and shortness of breath Fever: No recent fever Gastrointestinal:Negative for nausea and vomiting  Objective: Vital signs in last 24 hours: Temp:  [98.4 F (36.9 C)-99.2 F (37.3 C)] 98.4 F (36.9 C) (08/16 1121) Pulse Rate:  [64-77] 64 (08/16 1121) Resp:  [15-20] 15 (08/16 1121) BP: (136-144)/(82-91) 144/91 (08/16 1121) SpO2:  [98 %-100 %] 100 % (08/16 1121)  Intake/Output from previous day:  Intake/Output Summary (Last 24 hours) at 03/06/2020 1755 Last data filed at 03/06/2020 1746 Gross per 24 hour  Intake 4287.93 ml  Output 2225 ml  Net 2062.93 ml    Intake/Output this shift: Total I/O In: 1401.8 [P.O.:480; I.V.:621.8; IV Piggyback:300] Out: 425 [Urine:425]  Labs: Recent Labs    03/03/20 1913 03/04/20 0448 03/05/20 1304 03/06/20 0441  HGB 10.3* 10.1* 10.1* 10.3*   Recent Labs    03/05/20 1304 03/06/20 0441  WBC 13.5* 10.9*  RBC 3.55* 3.62*  HCT 30.1* 30.6*  PLT 271 279   Recent Labs    03/05/20 1304 03/06/20 0441  NA 131* 130*  K 3.9 4.4  CL 98 96*  CO2 25 26  BUN 16 17  CREATININE 1.02 0.76  GLUCOSE 247* 397*  CALCIUM 8.0* 8.2*   No results for input(s): LABPT, INR in the last 72 hours.   EXAM General - Patient is Alert, Appropriate and Oriented Extremity - Bulky dressing applied to the left hand. Dressing was removed, skin exam shows continued swelling to the hand but improved compared to yesterday. Bloody and purulent drainage noted from the incision site, the Penrose drain was advanced approx 2 cm. Patient is able to flex and extend fingers with mild discomfort and limited motion due to swelling. Intact to light touch. Cap refill intact to all  digits. Dressing/Incision - New bulky dressing was applied after advancement of the Penrose drain. Motor Function - intact, moving foot and toes well on exam.  Abdomen soft with normal bowel sounds.  Past Medical History:  Diagnosis Date  . Heart attack (HCC)    Pt claims he had heart attack a year ago ( 2015)- and was admitted in [facility] for that, but not given any meds or angiogram, on review of chart- I could not find any details like that.  . Hepatitis C, acute may 2016  . Heroin abuse (HCC)   . History of noncompliance with medical treatment   . Hypertension   . Type 1 diabetes (HCC)     Assessment/Plan: 2 Days Post-Op Procedure(s) (LRB): INCISION AND DRAINAGE ABSCESS (Left) Principal Problem:   Cellulitis of left hand Active Problems:   Diabetes mellitus type 1, uncontrolled (HCC)   Homelessness   Malnutrition of moderate degree (HCC)   Type 1 diabetes mellitus with hyperglycemia (HCC)   Cocaine abuse (HCC)   MRSA carrier   Polysubstance abuse (HCC)   SIRS (systemic inflammatory response syndrome) (HCC)  Estimated body mass index is 22.31 kg/m as calculated from the following:   Height as of this encounter: 5\' 4"  (1.626 m).   Weight as of this encounter: 59 kg. Advance diet   WBC down to 10.9 today, was 17.6 on 03/04/20. Penrose drain advanced this AM.  Will plan on possible removal tomorrow. Culture grew streptococcus group  C, continue with IV Linezolid WBC trending down, swelling is improving. Will make patient  NPO after midnight tonight, plan on re-checking wound tomorrow morning.  At this time  I don't believe a repeat I&D will be needed.  DVT Prophylaxis - Lovenox Minimal weightbearing to the left hand.  Valeria Batman, PA-C [facility] Orthopaedic Surgery 03/06/2020, 5:55 PM

## 2020-03-06 NOTE — Progress Notes (Signed)
PHARMACIST - PHYSICIAN COMMUNICATION  DR: TRH  CONCERNING: IV to Oral Route Change Policy  RECOMMENDATION: This patient is receiving Linezolid by the intravenous route.  Based on criteria approved by the Pharmacy and Therapeutics Committee, the intravenous medication(s) is/are being converted to the equivalent oral dose form(s).   DESCRIPTION: These criteria include:  The patient is eating (either orally or via tube) and/or has been taking other orally administered medications for a least 24 hours  The patient has no evidence of active gastrointestinal bleeding or impaired GI absorption (gastrectomy, short bowel, patient on TNA or NPO).  If you have questions about this conversion, please contact the Pharmacy Department  []   [phone] )  ( [phone] [x]   [phone] )  [facility] []   [phone] )  [facility] []   [phone] )  [facility] []   [phone] )      ( [phone], PharmD, BCPS.   Work Cell: [phone] 03/06/2020 12:29 PM

## 2020-03-06 NOTE — Progress Notes (Signed)
PROGRESS NOTE  MARVYN TORREZ DUK:[id number] DOB: 1991-02-11 DOA: 03/03/2020 PCP: Rolm Gala, NP  HPI/Recap of past 24 hours: HPI: The patient is a 29 yr old man who is an IVDU and also carries a diagnosis of DM I. He is non-compliant with his insulin usage, and is known to frequently present with celluliltis and DKA for these reasons. The redness began 3-4 days ago. Now the redness is spreading up his forearm. The hand is swollen, erythematous, and tender to touch. The patient is able to move his fingers, but cannot close his fist. He has had fevers and chills. He also is having frequent urination and feelings of weakness.  [facility] were consulted to admit the patient for further evaluation and treatment. He has been started on Zyvox due to prior allergic reaction to IV vancomycin. Blood cultures x 2 have been drawn.  Post I&D of left wrist abscess.  Deep wound Gram stain growing rare gram-positive cocci.    03/06/20: Seen and examined.  Pain is well controlled on left wrist.  Surgical dressing in place.  Leukocytosis is resolving, neutrophilia has resolved.  Hand surgery following, continue to follow deep wound cultures for ID and sensitivities.  Assessment/Plan: Principal Problem:   Cellulitis of left hand Active Problems:   Diabetes mellitus type 1, uncontrolled (HCC)   Homelessness   Malnutrition of moderate degree (HCC)   Type 1 diabetes mellitus with hyperglycemia (HCC)   Cocaine abuse (HCC)   MRSA carrier   Polysubstance abuse (HCC)   SIRS (systemic inflammatory response syndrome) (HCC)  Left upper extremity cellulitis with gram-positive cocci wound with abscess/tenosynovitis, POA Post I&D by Dr. Joice Lofts on 03/04/2020 X-ray of the hand showed subcutaneous edema Likely soft tissue abscess seen on CT scan left hand with findings likely consistent with tenosynovitis Blood cultures negative to date, continue to follow cultures MRSA screen negative Deep wound Gram  stain growing rare gram-positive cocci.  On Zyvox. Continue to follow deep wound cultures for ID and sensitivities. Monitor fever curve and WBC Hand surgery following Pain control and bowel regimen in place  Hypomagnesemia Serum magnesium 1.7 Repleted intravenously  Hypophosphatemia Serum phosphorus 1.6 Repleted intravenously  Mild hypovolemic hyponatremia, improving Presented with serum sodium 132  Hyperosmotic non-ketotic hyperglycemia:  Secondary to medication noncompliance.   Hemoglobin A1c 11.1 on 03/03/2020 Increased insulin doses  IV drug abuse with heroin Polysubstance abuse cessation counseling at bedside  Homelessness: Complicates all issues.  Cachexia/malnutrition: Consult nutrition   DVT prophylaxis: Lovenox subcu daily CODE STATUS: Full Code Family communication: None at bedside.  Status is: Inpatient    Dispo: The patient is from: Home.               Anticipated d/c is to: Home.               Anticipated d/c date is: 03/07/2020              Patient currently not stable for discharge due to ongoing treatment for left tenosynovitis with abscess.  Awaiting ID and sensitivities.        Objective: Vitals:   03/05/20 1140 03/05/20 2038 03/06/20 0504 03/06/20 1121  BP: (!) 146/95 136/82 140/88 (!) 144/91  Pulse: 64 77 64 64  Resp: 20 20 20 15   Temp: 98.8 F (37.1 C) 99.2 F (37.3 C) 98.7 F (37.1 C) 98.4 F (36.9 C)  TempSrc: Oral Oral Oral Oral  SpO2: 99% 98% 99% 100%  Weight:  Height:        Intake/Output Summary (Last 24 hours) at 03/06/2020 1515 Last data filed at 03/06/2020 1349 Gross per 24 hour  Intake 3691.99 ml  Output 1800 ml  Net 1891.99 ml   Filed Weights   03/03/20 1355  Weight: 59 kg    Exam:  . General: 29 y.o. year-old male pleasant in no acute distress.  Alert and oriented x3. . Cardiovascular: Regular rate and rhythm no rubs or gallops.   Marland Kitchen Respiratory: Clear to auscultation no wheeze or rales  . abdomen:  Soft nontender normal bowel sounds present . musculoskeletal: No lower extremity edema bilaterally.   . Skin: Left wrist in surgical dressing, is able to move his fingers.   Marland Kitchen Psychiatry: Mood is appropriate for condition and setting  Data Reviewed: CBC: Recent Labs  Lab 03/03/20 1357 03/03/20 1913 03/04/20 0448 03/05/20 1304 03/06/20 0441  WBC 17.3* 17.4* 17.6* 13.5* 10.9*  NEUTROABS 13.5*  --   --  10.0* 7.7  HGB 11.1* 10.3* 10.1* 10.1* 10.3*  HCT 34.7* 31.2* 28.8* 30.1* 30.6*  MCV 88.3 86.2 82.8 84.8 84.5  PLT 310 251 263 271 279   Basic Metabolic Panel: Recent Labs  Lab 03/03/20 1913 03/04/20 0240 03/04/20 0448 03/04/20 0643 03/04/20 1348 03/05/20 1304 03/06/20 0441  NA 133*   < > 133* 132* 128* 131* 130*  K 3.7   < > 4.0 3.8 3.8 3.9 4.4  CL 96*   < > 98 97* 95* 98 96*  CO2 28   < > 26 27 25 25 26   GLUCOSE 442*   < > 160* 145* 214* 247* 397*  BUN 23*   < > 21* 18 15 16 17   CREATININE 1.07   < > 0.76 0.74 0.76 1.02 0.76  CALCIUM 8.5*   < > 8.4* 8.3* 8.1* 8.0* 8.2*  MG 1.9  --   --   --   --  1.7  --   PHOS  --   --   --   --   --  1.6*  --    < > = values in this interval not displayed.   GFR: Estimated Creatinine Clearance: 115.7 mL/min (by C-G formula based on SCr of 0.76 mg/dL). Liver Function Tests: Recent Labs  Lab 03/03/20 1357 03/05/20 1304  AST 36 36  ALT 48* 35  ALKPHOS 120 95  BILITOT 1.1 0.4  PROT 7.6 6.2*  ALBUMIN 3.8 2.7*   No results for input(s): LIPASE, AMYLASE in the last 168 hours. No results for input(s): AMMONIA in the last 168 hours. Coagulation Profile: Recent Labs  Lab 03/03/20 1357  INR 1.0   Cardiac Enzymes: No results for input(s): CKTOTAL, CKMB, CKMBINDEX, TROPONINI in the last 168 hours. BNP (last 3 results) No results for input(s): PROBNP in the last 8760 hours. HbA1C: Recent Labs    03/03/20 1913  HGBA1C 11.1*   CBG: Recent Labs  Lab 03/05/20 1139 03/05/20 1633 03/05/20 2242 03/06/20 0802 03/06/20 1123    GLUCAP 271* 275* 270* 366* 296*   Lipid Profile: No results for input(s): CHOL, HDL, LDLCALC, TRIG, CHOLHDL, LDLDIRECT in the last 72 hours. Thyroid Function Tests: No results for input(s): TSH, T4TOTAL, FREET4, T3FREE, THYROIDAB in the last 72 hours. Anemia Panel: No results for input(s): VITAMINB12, FOLATE, FERRITIN, TIBC, IRON, RETICCTPCT in the last 72 hours. Urine analysis:    Component Value Date/Time   COLORURINE YELLOW (A) 03/03/2020 1613   APPEARANCEUR HAZY (A) 03/03/2020 1613  APPEARANCEUR Clear 07/08/2019 1825   LABSPEC 1.028 03/03/2020 1613   LABSPEC 1.026 04/06/2014 0751   PHURINE 5.0 03/03/2020 1613   GLUCOSEU >=500 (A) 03/03/2020 1613   GLUCOSEU >=500 04/06/2014 0751   HGBUR MODERATE (A) 03/03/2020 1613   BILIRUBINUR NEGATIVE 03/03/2020 1613   BILIRUBINUR Negative 07/08/2019 1825   BILIRUBINUR Negative 04/06/2014 0751   KETONESUR NEGATIVE 03/03/2020 1613   PROTEINUR NEGATIVE 03/03/2020 1613   NITRITE NEGATIVE 03/03/2020 1613   LEUKOCYTESUR NEGATIVE 03/03/2020 1613   LEUKOCYTESUR Negative 04/06/2014 0751   Sepsis Labs: @LABRCNTIP (procalcitonin:4,lacticidven:4)  ) Recent Results (from the past 240 hour(s))  Culture, blood (Routine x 2)     Status: None (Preliminary result)   Collection Time: 03/03/20  2:02 PM   Specimen: BLOOD  Result Value Ref Range Status   Specimen Description BLOOD BLOOD RIGHT FOREARM  Final   Special Requests   Final    BOTTLES DRAWN AEROBIC AND ANAEROBIC Blood Culture adequate volume   Culture   Final    NO GROWTH 3 DAYS Performed at [facility], [address] [place], [place] [address]    Report Status PENDING  Incomplete  Culture, blood (Routine x 2)     Status: None (Preliminary result)   Collection Time: 03/03/20  4:13 PM   Specimen: BLOOD  Result Value Ref Range Status   Specimen Description BLOOD RIGHT ANTECUBITAL  Final   Special Requests   Final    BOTTLES DRAWN AEROBIC AND ANAEROBIC Blood Culture adequate  volume   Culture   Final    NO GROWTH 3 DAYS Performed at [facility], [address] [place], [place] [address]    Report Status PENDING  Incomplete  SARS Coronavirus 2 by RT PCR (hospital order, performed in [facility] hospital lab) Nasopharyngeal Nasopharyngeal Swab     Status: None   Collection Time: 03/03/20  5:10 PM   Specimen: Nasopharyngeal Swab  Result Value Ref Range Status   SARS Coronavirus 2 NEGATIVE NEGATIVE Final    Comment: (NOTE) SARS-CoV-2 target nucleic acids are NOT DETECTED.  The SARS-CoV-2 RNA is generally detectable in upper and lower respiratory specimens during the acute phase of infection. The lowest concentration of SARS-CoV-2 viral copies this assay can detect is 250 copies / mL. A negative result does not preclude SARS-CoV-2 infection and should not be used as the sole basis for treatment or other patient management decisions.  A negative result may occur with improper specimen collection / handling, submission of specimen other than nasopharyngeal swab, presence of viral mutation(s) within the areas targeted by this assay, and inadequate number of viral copies (<250 copies / mL). A negative result must be combined with clinical observations, patient history, and epidemiological information.  Fact Sheet for Patients:   BoilerBrush.com.cyhttps://www.fda.gov/media/136312/download  Fact Sheet for Healthcare Providers: https://pope.com/https://www.fda.gov/media/136313/download  This test is not yet approved or  cleared by the [place] FDA and has been authorized for detection and/or diagnosis of SARS-CoV-2 by FDA under an Emergency Use Authorization (EUA).  This EUA will remain in effect (meaning this test can be used) for the duration of the COVID-19 declaration under Section 564(b)(1) of the Act, 21 U.S.C. section 360bbb-3(b)(1), unless the authorization is terminated or revoked sooner.  Performed at [facility], [address] [place], [place]  [address]   MRSA PCR Screening     Status: None   Collection Time: 03/04/20  1:48 PM   Specimen: Nasal Mucosa; Nasopharyngeal  Result Value Ref Range Status  MRSA by PCR NEGATIVE NEGATIVE Final    Comment:        The GeneXpert MRSA Assay (FDA approved for NASAL specimens only), is one component of a comprehensive MRSA colonization surveillance program. It is not intended to diagnose MRSA infection nor to guide or monitor treatment for MRSA infections. Performed at [facility], [address] [place], [place] [address]   Gram stain     Status: None   Collection Time: 03/04/20  4:44 PM   Specimen: PATH Other; Tissue  Result Value Ref Range Status   Specimen Description WOUND  Final   Special Requests NONE  Final   Gram Stain   Final    GRAM POSITIVE COCCI WBC SEEN MANY Performed at [facility], [address] [place], [place] [address]    Report Status 03/05/2020 FINAL  Final  Aerobic/Anaerobic Culture (surgical/deep wound)     Status: None (Preliminary result)   Collection Time: 03/04/20  4:44 PM   Specimen: PATH Other; Tissue  Result Value Ref Range Status   Specimen Description   Final    WOUND Performed at [facility], [address] [place], [place] [address]    Special Requests   Final    NONE Performed at [facility], [address], [place], [place] [address]    Gram Stain   Final    FEW WBC PRESENT, PREDOMINANTLY PMN RARE GRAM POSITIVE COCCI Performed at [facility], [address] [place], [place] [address]    Culture   Final    CULTURE REINCUBATED FOR BETTER GROWTH NO ANAEROBES ISOLATED; CULTURE IN PROGRESS FOR 5 DAYS    Report Status PENDING  Incomplete      Studies: No results found.  Scheduled Meds: . docusate sodium  100 mg Oral BID  . enoxaparin (LOVENOX) injection  40 mg Subcutaneous Q24H  . famotidine  20 mg Oral BID  . gabapentin  400 mg Oral QHS  . insulin aspart  0-15 Units  Subcutaneous TID AC & HS  . insulin aspart  7 Units Subcutaneous TID WC  . insulin glargine  25 Units Subcutaneous Q24H  . linezolid  600 mg Oral Q12H  . senna-docusate  2 tablet Oral BID    Continuous Infusions: . lactated ringers 50 mL/hr at 03/06/20 0736     LOS: 3 days     Darlin Drop, MD [facility] Pager [phone]  If 7PM-7AM, please contact night-coverage [email or web address] Password [facility] 03/06/2020, 3:15 PM

## 2020-03-07 ENCOUNTER — Ambulatory Visit: Payer: Medicaid Other

## 2020-03-07 LAB — CBC WITH DIFFERENTIAL/PLATELET
Abs Immature Granulocytes: 0.04 10*3/uL (ref 0.00–0.07)
Basophils Absolute: 0.1 10*3/uL (ref 0.0–0.1)
Basophils Relative: 1 %
Eosinophils Absolute: 0.1 10*3/uL (ref 0.0–0.5)
Eosinophils Relative: 2 %
HCT: 31.9 % — ABNORMAL LOW (ref 39.0–52.0)
Hemoglobin: 10.7 g/dL — ABNORMAL LOW (ref 13.0–17.0)
Immature Granulocytes: 1 %
Lymphocytes Relative: 29 %
Lymphs Abs: 2.5 10*3/uL (ref 0.7–4.0)
MCH: 28.5 pg (ref 26.0–34.0)
MCHC: 33.5 g/dL (ref 30.0–36.0)
MCV: 85.1 fL (ref 80.0–100.0)
Monocytes Absolute: 0.5 10*3/uL (ref 0.1–1.0)
Monocytes Relative: 6 %
Neutro Abs: 5.3 10*3/uL (ref 1.7–7.7)
Neutrophils Relative %: 61 %
Platelets: 304 10*3/uL (ref 150–400)
RBC: 3.75 MIL/uL — ABNORMAL LOW (ref 4.22–5.81)
RDW: 13.7 % (ref 11.5–15.5)
WBC: 8.5 10*3/uL (ref 4.0–10.5)
nRBC: 0 % (ref 0.0–0.2)

## 2020-03-07 LAB — BASIC METABOLIC PANEL
Anion gap: 9 (ref 5–15)
BUN: 20 mg/dL (ref 6–20)
CO2: 25 mmol/L (ref 22–32)
Calcium: 8.4 mg/dL — ABNORMAL LOW (ref 8.9–10.3)
Chloride: 93 mmol/L — ABNORMAL LOW (ref 98–111)
Creatinine, Ser: 0.87 mg/dL (ref 0.61–1.24)
GFR calc Af Amer: >60 mL/min (ref >60)
GFR calc non Af Amer: >60 mL/min (ref >60)
Glucose, Bld: 439 mg/dL — ABNORMAL HIGH (ref 70–99)
Potassium: 4.7 mmol/L (ref 3.5–5.1)
Sodium: 127 mmol/L — ABNORMAL LOW (ref 135–145)

## 2020-03-07 LAB — GLUCOSE, CAPILLARY
Glucose-Capillary: 398 mg/dL — ABNORMAL HIGH (ref 70–99)
Glucose-Capillary: 276 mg/dL — ABNORMAL HIGH (ref 70–99)
Glucose-Capillary: 121 mg/dL — ABNORMAL HIGH (ref 70–99)
Glucose-Capillary: 178 mg/dL — ABNORMAL HIGH (ref 70–99)

## 2020-03-07 MED ORDER — AMOXICILLIN 500 MG PO CAPS
500.0000 mg | ORAL_CAPSULE | Freq: Once | ORAL | Status: AC
  Administered 2020-03-07: 500 mg via ORAL
  Filled 2020-03-07: qty 1

## 2020-03-07 MED ORDER — INSULIN GLARGINE 100 UNIT/ML ~~LOC~~ SOLN
30.0000 [IU] | SUBCUTANEOUS | Status: DC
  Administered 2020-03-07: 30 [IU] via SUBCUTANEOUS
  Filled 2020-03-07 (×2): qty 0.3

## 2020-03-07 MED ORDER — INSULIN ASPART 100 UNIT/ML ~~LOC~~ SOLN
9.0000 [IU] | Freq: Three times a day (TID) | SUBCUTANEOUS | Status: DC
  Administered 2020-03-07 – 2020-03-08 (×5): 9 [IU] via SUBCUTANEOUS
  Filled 2020-03-07 (×5): qty 1

## 2020-03-07 NOTE — Progress Notes (Signed)
Subjective: 3 Days Post-Op Procedure(s) (LRB): INCISION AND DRAINAGE ABSCESS (Left) Patient reports pain as 7 out of 10  Patient is well this AM, bulky dressing applied to the left hand. Care management to assist with discharge planning, plan is for discharge home. Negative for chest pain and shortness of breath Fever: No recent fever Gastrointestinal:Negative for nausea and vomiting  Objective: Vital signs in last 24 hours: Temp:  [97.3 F (36.3 C)-98.5 F (36.9 C)] 97.3 F (36.3 C) (08/17 1102) Pulse Rate:  [59-82] 82 (08/17 1102) Resp:  [16-20] 20 (08/17 1102) BP: (130-148)/(81-100) 130/81 (08/17 1102) SpO2:  [98 %-99 %] 98 % (08/17 1102)  Intake/Output from previous day:  Intake/Output Summary (Last 24 hours) at 03/07/2020 1308 Last data filed at 03/07/2020 0900 Gross per 24 hour  Intake 2535.08 ml  Output 1025 ml  Net 1510.08 ml    Intake/Output this shift: Total I/O In: 240 [P.O.:240] Out: -   Labs: Recent Labs    03/05/20 1304 03/06/20 0441 03/07/20 0526  HGB 10.1* 10.3* 10.7*   Recent Labs    03/06/20 0441 03/07/20 0526  WBC 10.9* 8.5  RBC 3.62* 3.75*  HCT 30.6* 31.9*  PLT 279 304   Recent Labs    03/06/20 0441 03/07/20 0526  NA 130* 127*  K 4.4 4.7  CL 96* 93*  CO2 26 25  BUN 17 20  CREATININE 0.76 0.87  GLUCOSE 397* 439*  CALCIUM 8.2* 8.4*   No results for input(s): LABPT, INR in the last 72 hours.   EXAM General - Patient is Alert, Appropriate and Oriented Extremity - Bulky dressing applied to the left hand. Dressing was removed, skin exam shows improved swelling and erythema has significantly improved. Bloody  drainage noted from the incision site, decreased purulent drainage. Patient is able to flex and extend fingers with mild discomfort and limited motion due to swelling. Intact to light touch. Cap refill intact to all digits. Penrose drain was advanced however was not able to be completely removed, appears that the drain is  tangled with sutures. Dressing/Incision - New bulky dressing was applied after advancement of the Penrose drain. Motor Function - intact, moving foot and toes well on exam.  Abdomen soft with normal bowel sounds.  Past Medical History:  Diagnosis Date  . Heart attack (HCC)    Pt claims he had heart attack a year ago ( 2015)- and was admitted in [facility] for that, but not given any meds or angiogram, on review of chart- I could not find any details like that.  . Hepatitis C, acute may 2016  . Heroin abuse (HCC)   . History of noncompliance with medical treatment   . Hypertension   . Type 1 diabetes (HCC)     Assessment/Plan: 3 Days Post-Op Procedure(s) (LRB): INCISION AND DRAINAGE ABSCESS (Left) Principal Problem:   Cellulitis of left hand Active Problems:   Diabetes mellitus type 1, uncontrolled (HCC)   Homelessness   Malnutrition of moderate degree (HCC)   Type 1 diabetes mellitus with hyperglycemia (HCC)   Cocaine abuse (HCC)   MRSA carrier   Polysubstance abuse (HCC)   SIRS (systemic inflammatory response syndrome) (HCC)  Estimated body mass index is 22.31 kg/m as calculated from the following:   Height as of this encounter: 5\' 4"  (1.626 m).   Weight as of this encounter: 59 kg. Advance diet   WBC down to 8.5 today.  Hand is improving with antibiotics at this time. Attempted to remove the drain, penrose  is stuck likely a suture has gone thru the drain.  Will plan on removing one stitch tomorrow and remove the drain. Culture grew streptococcus group C, continue with IV Linezolid. Continue with bulky dressing and will plan on drain removal tomorrow.  DVT Prophylaxis - Lovenox Minimal weightbearing to the left hand.  Valeria Batman, PA-C [facility] Orthopaedic Surgery 03/07/2020, 1:08 PM

## 2020-03-07 NOTE — Progress Notes (Addendum)
PROGRESS NOTE  Emeline Darlingony B Nading ZOX:[id number] DOB: [birth year] DOA: 03/03/2020 PCP: [name], [name], NP  HPI/Recap of past 24 hours: HPI: The patient is a 29 yr old man who is an IVDU and also carries a diagnosis of DM I. He is non-compliant with his insulin usage, and is known to frequently present with celluliltis and DKA for these reasons. The redness began 3-4 days ago. Now the redness is spreading up his forearm. The hand is swollen, erythematous, and tender to touch. The patient is able to move his fingers, but cannot close his fist. He has had fevers and chills.   [facility] were consulted to admit the patient for further evaluation and treatment.  Started on Zyvox due to prior allergic reaction to IV vancomycin. Blood cultures x 2 have been negative to date.  Post I&D of left wrist abscess on 03/04/20 by Dr. Joice LoftsPoggi.  Deep wound Gram stain growing Strep Group C.   03/07/20: Seen and examined.  Pain is well controlled.  POD #3 post I&D of left wrist.  Zyvox switched to amoxicillin.  Assessment/Plan: Principal Problem:   Cellulitis of left hand Active Problems:   Diabetes mellitus type 1, uncontrolled (HCC)   Homelessness   Malnutrition of moderate degree (HCC)   Type 1 diabetes mellitus with hyperglycemia (HCC)   Cocaine abuse (HCC)   MRSA carrier   Polysubstance abuse (HCC)   SIRS (systemic inflammatory response syndrome) (HCC)  Sepsis, resolved, 2/2 to Left upper extremity cellulitis with Strep Group C. wound with abscess/tenosynovitis Post I&D by Dr. Joice LoftsPoggi on 03/04/2020 Presented with RR 23 and WBC 17 K, now resolved. X-ray of the hand showed subcutaneous edema Likely soft tissue abscess seen on CT scan left hand with findings likely consistent with tenosynovitis Deep wound Gram stain growing Strep Group C.  Blood cultures negative to date, continue to follow cultures MRSA screen negative Zyvox switched to amoxicillin 8/17. Hand surgery following Pain control and  bowel regimen in place  Pseudohyponatremia Serum Na+ 127 with serum glucose >400 Insulin doses increased  Type 1 diabetes with hyperglycemia Uncontrolled Insulin doses increased Hemoglobin A1c 11.1 on 03/03/2020  Hypomagnesemia Serum magnesium 1.7 Repleted intravenously  Hypophosphatemia Serum phosphorus 1.6 Repleted intravenously  IV drug abuse with heroin Polysubstance abuse cessation counseling at bedside  Homelessness: Complicates all issues.  Cachexia/malnutrition: Encourage oral intake   DVT prophylaxis: Lovenox subcu daily CODE STATUS: Full Code Family communication: None at bedside.  Status is: Inpatient    Dispo: The patient is from: Home.               Anticipated d/c is to: Home.               Anticipated d/c date is: 03/08/2020              Patient currently not stable for discharge due to ongoing treatment for left tenosynovitis with abscess.         Objective: Vitals:   03/06/20 1942 03/07/20 0427 03/07/20 0644 03/07/20 1102  BP: (!) 142/92 (!) 148/100 (!) 141/95 130/81  Pulse: 67 (!) 59 (!) 59 82  Resp: 16 16  20   Temp: 98.5 F (36.9 C) 98.3 F (36.8 C)  (!) 97.3 F (36.3 C)  TempSrc: Oral Oral  Oral  SpO2: 99% 99%  98%  Weight:      Height:        Intake/Output Summary (Last 24 hours) at 03/07/2020 1557 Last data filed at 03/07/2020 1300 Gross  per 24 hour  Intake 1939.14 ml  Output 1025 ml  Net 914.14 ml   Filed Weights   03/03/20 1355  Weight: 59 kg    Exam:  . General: 29 y.o. year-old male pleasant well-developed well-nourished no acute distress. Alert and oriented x3.  . Cardiovascular: Regular rate and rhythm no rubs or gallops. Marland Kitchen Respiratory: Clear to auscultation no wheezes or rales.  . Abdomen: Soft nontender normal bowel sounds present.  . Musculoskeletal: No lower extremity edema bilaterally.  . Skin: Left wrist in surgical dressing, fingers appear less swollen, able to move them. Marland Kitchen Psychiatry: Mood is  appropriate for condition and setting.   Data Reviewed: CBC: Recent Labs  Lab 03/03/20 1357 03/03/20 1357 03/03/20 1913 03/04/20 0448 03/05/20 1304 03/06/20 0441 03/07/20 0526  WBC 17.3*   < > 17.4* 17.6* 13.5* 10.9* 8.5  NEUTROABS 13.5*  --   --   --  10.0* 7.7 5.3  HGB 11.1*   < > 10.3* 10.1* 10.1* 10.3* 10.7*  HCT 34.7*   < > 31.2* 28.8* 30.1* 30.6* 31.9*  MCV 88.3   < > 86.2 82.8 84.8 84.5 85.1  PLT 310   < > 251 263 271 279 304   < > = values in this interval not displayed.   Basic Metabolic Panel: Recent Labs  Lab 03/03/20 1913 03/04/20 0240 03/04/20 0643 03/04/20 1348 03/05/20 1304 03/06/20 0441 03/07/20 0526  NA 133*   < > 132* 128* 131* 130* 127*  K 3.7   < > 3.8 3.8 3.9 4.4 4.7  CL 96*   < > 97* 95* 98 96* 93*  CO2 28   < > 27 25 25 26 25   GLUCOSE 442*   < > 145* 214* 247* 397* 439*  BUN 23*   < > 18 15 16 17 20   CREATININE 1.07   < > 0.74 0.76 1.02 0.76 0.87  CALCIUM 8.5*   < > 8.3* 8.1* 8.0* 8.2* 8.4*  MG 1.9  --   --   --  1.7  --   --   PHOS  --   --   --   --  1.6*  --   --    < > = values in this interval not displayed.   GFR: Estimated Creatinine Clearance: 106.4 mL/min (by C-G formula based on SCr of 0.87 mg/dL). Liver Function Tests: Recent Labs  Lab 03/03/20 1357 03/05/20 1304  AST 36 36  ALT 48* 35  ALKPHOS 120 95  BILITOT 1.1 0.4  PROT 7.6 6.2*  ALBUMIN 3.8 2.7*   No results for input(s): LIPASE, AMYLASE in the last 168 hours. No results for input(s): AMMONIA in the last 168 hours. Coagulation Profile: Recent Labs  Lab 03/03/20 1357  INR 1.0   Cardiac Enzymes: No results for input(s): CKTOTAL, CKMB, CKMBINDEX, TROPONINI in the last 168 hours. BNP (last 3 results) No results for input(s): PROBNP in the last 8760 hours. HbA1C: No results for input(s): HGBA1C in the last 72 hours. CBG: Recent Labs  Lab 03/06/20 1123 03/06/20 1650 03/06/20 2100 03/07/20 0747 03/07/20 1100  GLUCAP 296* 227* 243* 398* 276*   Lipid  Profile: No results for input(s): CHOL, HDL, LDLCALC, TRIG, CHOLHDL, LDLDIRECT in the last 72 hours. Thyroid Function Tests: No results for input(s): TSH, T4TOTAL, FREET4, T3FREE, THYROIDAB in the last 72 hours. Anemia Panel: No results for input(s): VITAMINB12, FOLATE, FERRITIN, TIBC, IRON, RETICCTPCT in the last 72 hours. Urine analysis:    Component  Value Date/Time   COLORURINE YELLOW (A) 03/03/2020 1613   APPEARANCEUR HAZY (A) 03/03/2020 1613   APPEARANCEUR Clear 07/08/2019 1825   LABSPEC 1.028 03/03/2020 1613   LABSPEC 1.026 04/06/2014 0751   PHURINE 5.0 03/03/2020 1613   GLUCOSEU >=500 (A) 03/03/2020 1613   GLUCOSEU >=500 04/06/2014 0751   HGBUR MODERATE (A) 03/03/2020 1613   BILIRUBINUR NEGATIVE 03/03/2020 1613   BILIRUBINUR Negative 07/08/2019 1825   BILIRUBINUR Negative 04/06/2014 0751   KETONESUR NEGATIVE 03/03/2020 1613   PROTEINUR NEGATIVE 03/03/2020 1613   NITRITE NEGATIVE 03/03/2020 1613   LEUKOCYTESUR NEGATIVE 03/03/2020 1613   LEUKOCYTESUR Negative 04/06/2014 0751   Sepsis Labs: @LABRCNTIP (procalcitonin:4,lacticidven:4)  ) Recent Results (from the past 240 hour(s))  Culture, blood (Routine x 2)     Status: None (Preliminary result)   Collection Time: 03/03/20  2:02 PM   Specimen: BLOOD  Result Value Ref Range Status   Specimen Description BLOOD BLOOD RIGHT FOREARM  Final   Special Requests   Final    BOTTLES DRAWN AEROBIC AND ANAEROBIC Blood Culture adequate volume   Culture   Final    NO GROWTH 4 DAYS Performed at [facility], [address] [place], [place]    Report Status PENDING  Incomplete  Culture, blood (Routine x 2)     Status: None (Preliminary result)   Collection Time: 03/03/20  4:13 PM   Specimen: BLOOD  Result Value Ref Range Status   Specimen Description BLOOD RIGHT ANTECUBITAL  Final   Special Requests   Final    BOTTLES DRAWN AEROBIC AND ANAEROBIC Blood Culture adequate volume   Culture   Final    NO GROWTH 4  DAYS Performed at [facility], [address], [place], [place]    Report Status PENDING  Incomplete  SARS Coronavirus 2 by RT PCR (hospital order, performed in [facility] hospital lab) Nasopharyngeal Nasopharyngeal Swab     Status: None   Collection Time: 03/03/20  5:10 PM   Specimen: Nasopharyngeal Swab  Result Value Ref Range Status   SARS Coronavirus 2 NEGATIVE NEGATIVE Final    Comment: (NOTE) SARS-CoV-2 target nucleic acids are NOT DETECTED.  The SARS-CoV-2 RNA is generally detectable in upper and lower respiratory specimens during the acute phase of infection. The lowest concentration of SARS-CoV-2 viral copies this assay can detect is 250 copies / mL. A negative result does not preclude SARS-CoV-2 infection and should not be used as the sole basis for treatment or other patient management decisions.  A negative result may occur with improper specimen collection / handling, submission of specimen other than nasopharyngeal swab, presence of viral mutation(s) within the areas targeted by this assay, and inadequate number of viral copies (<250 copies / mL). A negative result must be combined with clinical observations, patient history, and epidemiological information.  Fact Sheet for Patients:   03/05/20  Fact Sheet for Healthcare Providers: BoilerBrush.com.cy  This test is not yet approved or  cleared by the https://pope.com/ FDA and has been authorized for detection and/or diagnosis of SARS-CoV-2 by FDA under an Emergency Use Authorization (EUA).  This EUA will remain in effect (meaning this test can be used) for the duration of the COVID-19 declaration under Section 564(b)(1) of the Act, 21 U.S.C. section 360bbb-3(b)(1), unless the authorization is terminated or revoked sooner.  Performed at [facility], [address] [place], [place]   MRSA PCR Screening     Status: None  Collection Time: 03/04/20  1:48 PM   Specimen: Nasal Mucosa; Nasopharyngeal  Result Value Ref Range Status   MRSA by PCR NEGATIVE NEGATIVE Final    Comment:        The GeneXpert MRSA Assay (FDA approved for NASAL specimens only), is one component of a comprehensive MRSA colonization surveillance program. It is not intended to diagnose MRSA infection nor to guide or monitor treatment for MRSA infections. Performed at [facility], [address] [place], [place] [address]   Gram stain     Status: None   Collection Time: 03/04/20  4:44 PM   Specimen: PATH Other; Tissue  Result Value Ref Range Status   Specimen Description WOUND  Final   Special Requests NONE  Final   Gram Stain   Final    GRAM POSITIVE COCCI WBC SEEN MANY Performed at [facility], [address], [place], [place] [address]    Report Status 03/05/2020 FINAL  Final  Aerobic/Anaerobic Culture (surgical/deep wound)     Status: None (Preliminary result)   Collection Time: 03/04/20  4:44 PM   Specimen: PATH Other; Tissue  Result Value Ref Range Status   Specimen Description   Final    WOUND Performed at [facility], [address], [place], [place] [address]    Special Requests   Final    NONE Performed at [facility], [address], [place], [place] [address]    Gram Stain   Final    FEW WBC PRESENT, PREDOMINANTLY PMN RARE GRAM POSITIVE COCCI Performed at [facility], [address], [place], [place] [address]    Culture   Final    FEW STREPTOCOCCUS GROUP C Beta hemolytic streptococci are predictably susceptible to penicillin and other beta lactams. Susceptibility testing not routinely performed. NO ANAEROBES ISOLATED; CULTURE IN PROGRESS FOR 5 DAYS    Report Status PENDING  Incomplete      Studies: No results found.  Scheduled Meds: . amoxicillin  500 mg Oral Once  . docusate sodium  100 mg Oral BID  . enoxaparin (LOVENOX) injection  40 mg  Subcutaneous Q24H  . famotidine  20 mg Oral BID  . gabapentin  400 mg Oral QHS  . insulin aspart  0-15 Units Subcutaneous TID AC & HS  . insulin aspart  9 Units Subcutaneous TID WC  . insulin glargine  30 Units Subcutaneous Q24H  . linezolid  600 mg Oral Q12H  . senna-docusate  2 tablet Oral BID    Continuous Infusions: . lactated ringers 50 mL/hr at 03/07/20 0946     LOS: 4 days     Darlin Drop, MD [facility] Pager [phone]  If 7PM-7AM, please contact night-coverage [email or web address] Password [facility] 03/07/2020, 3:57 PM

## 2020-03-08 LAB — BASIC METABOLIC PANEL
Anion gap: 10 (ref 5–15)
BUN: 29 mg/dL — ABNORMAL HIGH (ref 6–20)
CO2: 25 mmol/L (ref 22–32)
Calcium: 9.1 mg/dL (ref 8.9–10.3)
Chloride: 93 mmol/L — ABNORMAL LOW (ref 98–111)
Creatinine, Ser: 0.98 mg/dL (ref 0.61–1.24)
GFR calc Af Amer: >60 mL/min (ref >60)
GFR calc non Af Amer: >60 mL/min (ref >60)
Glucose, Bld: 448 mg/dL — ABNORMAL HIGH (ref 70–99)
Potassium: 5 mmol/L (ref 3.5–5.1)
Sodium: 128 mmol/L — ABNORMAL LOW (ref 135–145)

## 2020-03-08 LAB — CULTURE, BLOOD (ROUTINE X 2)
Culture: NO GROWTH
Culture: NO GROWTH
Special Requests: ADEQUATE
Special Requests: ADEQUATE

## 2020-03-08 LAB — CBC WITH DIFFERENTIAL/PLATELET
Abs Immature Granulocytes: 0.04 10*3/uL (ref 0.00–0.07)
Basophils Absolute: 0.1 10*3/uL (ref 0.0–0.1)
Basophils Relative: 1 %
Eosinophils Absolute: 0.2 10*3/uL (ref 0.0–0.5)
Eosinophils Relative: 2 %
HCT: 32.8 % — ABNORMAL LOW (ref 39.0–52.0)
Hemoglobin: 10.8 g/dL — ABNORMAL LOW (ref 13.0–17.0)
Immature Granulocytes: 1 %
Lymphocytes Relative: 31 %
Lymphs Abs: 2.7 10*3/uL (ref 0.7–4.0)
MCH: 28.2 pg (ref 26.0–34.0)
MCHC: 32.9 g/dL (ref 30.0–36.0)
MCV: 85.6 fL (ref 80.0–100.0)
Monocytes Absolute: 0.7 10*3/uL (ref 0.1–1.0)
Monocytes Relative: 8 %
Neutro Abs: 5.1 10*3/uL (ref 1.7–7.7)
Neutrophils Relative %: 57 %
Platelets: 331 10*3/uL (ref 150–400)
RBC: 3.83 MIL/uL — ABNORMAL LOW (ref 4.22–5.81)
RDW: 14 % (ref 11.5–15.5)
WBC: 8.7 10*3/uL (ref 4.0–10.5)
nRBC: 0 % (ref 0.0–0.2)

## 2020-03-08 LAB — GLUCOSE, CAPILLARY
Glucose-Capillary: 422 mg/dL — ABNORMAL HIGH (ref 70–99)
Glucose-Capillary: 426 mg/dL — ABNORMAL HIGH (ref 70–99)
Glucose-Capillary: 237 mg/dL — ABNORMAL HIGH (ref 70–99)

## 2020-03-08 MED ORDER — AMOXICILLIN 500 MG PO CAPS: 500.0000 mg | ORAL_CAPSULE | Freq: Three times a day (TID) | ORAL | 0 refills | Status: AC

## 2020-03-08 MED ORDER — AMOXICILLIN 500 MG PO CAPS
500.0000 mg | ORAL_CAPSULE | Freq: Three times a day (TID) | ORAL | Status: DC
  Administered 2020-03-08: 500 mg via ORAL
  Filled 2020-03-08 (×2): qty 1

## 2020-03-08 MED ORDER — OXYCODONE HCL 10 MG PO TABS: 10.0000 mg | ORAL_TABLET | Freq: Two times a day (BID) | ORAL | 0 refills | Status: DC | PRN

## 2020-03-08 MED ORDER — INSULIN GLARGINE 100 UNIT/ML ~~LOC~~ SOLN: 30.0000 [IU] | Freq: Two times a day (BID) | SUBCUTANEOUS | Status: DC

## 2020-03-08 MED ORDER — INSULIN GLARGINE 100 UNIT/ML ~~LOC~~ SOLN
30.0000 [IU] | Freq: Two times a day (BID) | SUBCUTANEOUS | Status: DC
  Administered 2020-03-08: 30 [IU] via SUBCUTANEOUS
  Filled 2020-03-08 (×2): qty 0.3

## 2020-03-08 MED ORDER — SENNOSIDES-DOCUSATE SODIUM 8.6-50 MG PO TABS: 2.0000 | ORAL_TABLET | Freq: Two times a day (BID) | ORAL | Status: DC | PRN

## 2020-03-08 MED ORDER — INSULIN ASPART 100 UNIT/ML ~~LOC~~ SOLN
20.0000 [IU] | Freq: Once | SUBCUTANEOUS | Status: AC
  Administered 2020-03-08: 20 [IU] via SUBCUTANEOUS
  Filled 2020-03-08: qty 1

## 2020-03-08 MED ORDER — OXYCODONE HCL 5 MG PO TABS
15.0000 mg | ORAL_TABLET | Freq: Two times a day (BID) | ORAL | Status: DC | PRN
  Administered 2020-03-08: 15 mg via ORAL

## 2020-03-08 MED ORDER — OXYCODONE HCL 10 MG PO TABS: 10.0000 mg | ORAL_TABLET | ORAL | 0 refills | Status: AC

## 2020-03-08 NOTE — Progress Notes (Signed)
Paged MD regarding CBG.

## 2020-03-08 NOTE — Progress Notes (Signed)
Subjective: 4 Days Post-Op Procedure(s) (LRB): INCISION AND DRAINAGE ABSCESS (Left) Patient reports pain as 6 out of 10  Patient is well this AM, bulky dressing applied to the left hand. Plan for drain removal today. Negative for chest pain and shortness of breath Fever: No recent fever Gastrointestinal:Negative for nausea and vomiting  Objective: Vital signs in last 24 hours: Temp:  [97.3 F (36.3 C)-98.7 F (37.1 C)] 98.7 F (37.1 C) (08/17 1937) Pulse Rate:  [76-82] 76 (08/17 1937) Resp:  [20] 20 (08/17 1937) BP: (119-130)/(67-81) 119/67 (08/17 1937) SpO2:  [98 %-99 %] 99 % (08/17 1937)  Intake/Output from previous day:  Intake/Output Summary (Last 24 hours) at 03/08/2020 0856 Last data filed at 03/08/2020 0714 Gross per 24 hour  Intake 1849.32 ml  Output 800 ml  Net 1049.32 ml    Intake/Output this shift: Total I/O In: -  Out: 400 [Urine:400]  Labs: Recent Labs    03/05/20 1304 03/06/20 0441 03/07/20 0526 03/08/20 0547  HGB 10.1* 10.3* 10.7* 10.8*   Recent Labs    03/07/20 0526 03/08/20 0547  WBC 8.5 8.7  RBC 3.75* 3.83*  HCT 31.9* 32.8*  PLT 304 331   Recent Labs    03/07/20 0526 03/08/20 0547  NA 127* 128*  K 4.7 5.0  CL 93* 93*  CO2 25 25  BUN 20 29*  CREATININE 0.87 0.98  GLUCOSE 439* 448*  CALCIUM 8.4* 9.1   No results for input(s): LABPT, INR in the last 72 hours.   EXAM General - Patient is Alert, Appropriate and Oriented Extremity - Bulky dressing applied to the left hand. Dressing was removed, skin exam shows improved swelling and erythema has significantly improved. Bloody  drainage noted from the incision site, decreased purulent drainage. Patient is able to flex and extend fingers with mild discomfort and limited motion due to swelling. Intact to light touch. Cap refill intact to all digits. Penrose drain was advanced, two sutures were removed to allow the drain to slide out from the incision.  Steri-strips were then applied  to the hand. Dressing/Incision - New bulky dressing was applied after advancement of the Penrose drain. Motor Function - intact, moving foot and toes well on exam.  Abdomen soft with normal bowel sounds.  Past Medical History:  Diagnosis Date  . Heart attack (HCC)    Pt claims he had heart attack a year ago ( 2015)- and was admitted in [facility] for that, but not given any meds or angiogram, on review of chart- I could not find any details like that.  . Hepatitis C, acute may 2016  . Heroin abuse (HCC)   . History of noncompliance with medical treatment   . Hypertension   . Type 1 diabetes (HCC)     Assessment/Plan: 4 Days Post-Op Procedure(s) (LRB): INCISION AND DRAINAGE ABSCESS (Left) Principal Problem:   Cellulitis of left hand Active Problems:   Diabetes mellitus type 1, uncontrolled (HCC)   Homelessness   Malnutrition of moderate degree (HCC)   Type 1 diabetes mellitus with hyperglycemia (HCC)   Cocaine abuse (HCC)   MRSA carrier   Polysubstance abuse (HCC)   SIRS (systemic inflammatory response syndrome) (HCC)  Estimated body mass index is 22.31 kg/m as calculated from the following:   Height as of this encounter: 5\' 4"  (1.626 m).   Weight as of this encounter: 59 kg. Advance diet   WBC down to 8.7 today.  Hand is improving with antibiotics at this time. Amoxicillin added to ABX  regimen yesterday. Two sutures removed and drain removed today. Culture grew streptococcus group C. Continue with bulky dressing. Cleared for discharge from Orthopaedic standpoint. Plan for skin check in the office early next week.  DVT Prophylaxis - Lovenox Minimal weightbearing to the left hand.  Valeria Batman, PA-C [facility] Orthopaedic Surgery 03/08/2020, 8:56 AM

## 2020-03-08 NOTE — TOC Initial Note (Addendum)
Transition of Care [facility]) - Initial/Assessment Note    Patient Details  Name: Trevor Brown MRN: [id number] Date of Birth: 1991/06/06  Transition of Care [facility] CM/SW Contact:    Chapman Fitch, RN Phone Number: 03/08/2020, 1:18 PM  Clinical Narrative:                 Patient admitted from home with abscess Patient states that he is currently lives in an apartment with his sister  Patient states he does not have transportation on his own, but his sister is available for transportaion .   PCP Iloabachie, Chioma Patient obtains medications from medication management.  Patient confirms that he has all of his insulin, and testing supplies at home.    Patient to pick up antibiotic at [facility]ication Management  At discharge, and pick up pain medication at [facility].   Patient will have dressing changes at discharge.   Nursing staff to provide education, and dressing supplies at discharge .   Patient agreeable to substance abuse resources.  Provided with residential, and intensive outpatient resources.    Expected Discharge Plan: Home/Self Care Barriers to Discharge: No Barriers Identified   Patient Goals and CMS Choice        Expected Discharge Plan and Services Expected Discharge Plan: Home/Self Care       Living arrangements for the past 2 months: Apartment Expected Discharge Date: 03/08/20                                    Prior Living Arrangements/Services Living arrangements for the past 2 months: Apartment Lives with:: Siblings              Current home services: DME    Activities of Daily Living Home Assistive Devices/Equipment: None ADL Screening (condition at time of admission) Patient's cognitive ability adequate to safely complete daily activities?: Yes Is the patient deaf or have difficulty hearing?: No Does the patient have difficulty seeing, even when wearing glasses/contacts?: No Does the patient have difficulty concentrating, remembering, or  making decisions?: No Patient able to express need for assistance with ADLs?: Yes Does the patient have difficulty dressing or bathing?: No Independently performs ADLs?: Yes (appropriate for developmental age) Does the patient have difficulty walking or climbing stairs?: No Weakness of Legs: None Weakness of Arms/Hands: None  Permission Sought/Granted                  Emotional Assessment       Orientation: : Oriented to Self, Oriented to Place, Oriented to  Time, Oriented to Situation   Psych Involvement: No (comment)  Admission diagnosis:  SIRS (systemic inflammatory response syndrome) (HCC) [R65.10] Cellulitis of left hand [L03.114] Diabetic ketoacidosis without coma associated with type 1 diabetes mellitus (HCC) [E10.10] Sepsis due to cellulitis (HCC) [L03.90, A41.9] Patient Active Problem List   Diagnosis Date Noted  . SIRS (systemic inflammatory response syndrome) (HCC) 03/03/2020  . Cellulitis of left hand 03/03/2020  . Elevated blood pressure reading 01/18/2020  . Hypokalemia   . Sepsis without acute organ dysfunction (HCC)   . Dental abscess 09/12/2019  . Intractable vomiting   . Hematemesis with nausea   . Cannabis hyperemesis syndrome concurrent with and due to cannabis abuse (HCC)   . Elevated LFTs   . Abnormal laboratory test result 07/20/2019  . Smoking 07/20/2019  . History of hepatitis C 02/25/2019  . Health care maintenance 02/25/2019  .  Peripheral neuropathy 02/25/2019  . Hand abscess 10/13/2018  . IVDU (intravenous drug user) 10/11/2018  . Hyperkalemia 10/11/2018  . Cellulitis and abscess of hand 08/17/2018  . Polysubstance abuse (HCC) 01/20/2018  . Abdominal pain 06/29/2017  . Hyponatremia 06/29/2017  . DKA (diabetic ketoacidosis) (HCC) 06/29/2017  . Dental erosion extending into pulp 12/31/2016  . 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 Elevated transaminase level 03/27/2016  . Diabetes mellitus type 1 (HCC) 03/27/2016  . Tobacco abuse counseling  03/27/2016  . Transaminitis 03/25/2016  . Leukocytosis 01/13/2016  . Cannabinoid hyperemesis syndrome 01/10/2016  . Tobacco abuse 01/10/2016  . Type 1 diabetes mellitus with hyperglycemia (HCC) 12/12/2015  . Cocaine abuse (HCC) 12/12/2015  . DKA (diabetic ketoacidoses) (HCC) 06/10/2015  . Hidradenitis suppurativa of left axilla   . DKA, type 1 (HCC) 06/06/2015  . Malnutrition of moderate degree (HCC) 04/08/2015  . Hepatitis C 12/13/2014  . Diabetes type 1, uncontrolled (HCC) 12/11/2014  . Major depressive disorder, single episode, mild (HCC)   . Major depression, single episode 12/10/2014  . Diabetes mellitus type 1, uncontrolled (HCC) 12/09/2014  . Homelessness 12/09/2014   PCP:  Rolm Gala, NP Pharmacy:   [facility] [address] [place] [address] Phone: [phone] Fax: [phone]  [facility] [place], [place] - [address] [place] [address] Phone: [phone] Fax: [phone]  [facility] [address]), [place] - [address] [place]) [place] [address] Phone: [phone] Fax: [phone]     Social Determinants of Health (SDOH) Interventions    Readmission Risk Interventions Readmission Risk Prevention Plan 03/08/2020 10/15/2018  Transportation Screening Complete Complete  PCP or Specialist Appt within 3-5 Days - Complete  Palliative Care Screening Not Applicable -  Medication Review (RN Care Manager) Complete Complete  Some recent data might be hidden

## 2020-03-08 NOTE — Discharge Summary (Signed)
Physician Discharge Summary   Trevor Brown  male DOB: [birth year]  WNI:[id number]  PCP: Rolm Gala, NP  Admit date: 03/03/2020 Discharge date: 03/08/2020  Admitted From: home Disposition:  home CODE STATUS: Full code  Discharge Instructions    Discharge instructions   Complete by: As directed    Please finish 7 more days of antibiotic with Amoxicillin as directed.    Continue dressing change as instructed by your nurse.    Follow up with orthopedics in clinic within 1 week after discharge for wound check.  Your blood sugars have been high, so your Lantus is increased to 30 units twice a day. - - -   Discharge wound care:   Complete by: As directed    Continue dressing change as instructed by your nurse.       Hospital Course:  For full details, please see H&P, progress notes, consult notes and ancillary notes.  Briefly,  The patient is a 29 yr old Caucasian male who is an IVDU and also carries a diagnosis of DM I. He is non-compliant with his insulin usage, and is known to frequently present with celluliltis and DKA for these reasons.   Pt presented with redness and swelling of his left hand.    Sepsis, resolved, 2/2 to Left wrist cellulitis with Strep Group C. wound with abscess/tenosynovitis Post I&D by Dr. Joice Lofts on 03/04/2020 Presented with RR 23 and WBC 17 K, now resolved.  CT left hand showed "Multilocular collection, likely soft tissue abscess overlying the radial aspect of the wrist measuring 2.1x0.9 x 3.2 cm."  Started on Zyvox due to prior allergic reaction to IV vancomycin.  Deep wound Gram stain growing Strep Group C.   Zyvox switched to amoxicillin 8/17.  Ortho performed dressing changes and packing during hospitalization.  Pt was discharged on 7 more days of Amoxicillin and will follow up with outpatinet clinic for wound check within a week after discharge.  Type 1 diabetes with hyperglycemia Hx of insulin non-compliance Uncontrolled.  BG >600 on  presentation.  Hemoglobin A1c 11.1 on 03/03/2020.  Pt was discharged on increased Lantus 30u BID as will as his home regimen of SSI.  Pt [facility] from [facility] and confirmed he had all of his insulin and testing supplies at home.  Pseudohyponatremia Due to severely elevated BG.  Hypomagnesemia Repleted intravenously  Hypophosphatemia Repleted intravenously  IV drug abuse with heroin Polysubstance abuse cessation counseling provided.    Cachexia/malnutrition:  Encourage oral intake   Discharge Diagnoses:  Principal Problem:   Cellulitis of left hand Active Problems:   Diabetes mellitus type 1, uncontrolled (HCC)   Homelessness   Malnutrition of moderate degree (HCC)   Type 1 diabetes mellitus with hyperglycemia (HCC)   Cocaine abuse (HCC)   MRSA carrier   Polysubstance abuse (HCC)   SIRS (systemic inflammatory response syndrome) (HCC)    Discharge Instructions:  Allergies as of 03/08/2020      Reactions   Bee Venom Anaphylaxis   Ibuprofen Itching, Rash, Other (See Comments)   Other reaction(s): Other (See Comments) Stomach upset Reaction:  GI upset    Tramadol Hives, Swelling   Vancomycin Rash, Other (See Comments)   Reaction:  Red man's syndrome       Medication List    STOP taking these medications   ondansetron 4 MG disintegrating tablet Commonly known as: Zofran ODT     TAKE these medications   amoxicillin 500 MG capsule Commonly known as: AMOXIL  Take 1 capsule (500 mg total) by mouth every 8 (eight) hours for 7 days. Antibiotic.   famotidine 20 MG tablet Commonly known as: PEPCID Take 20 mg by mouth 2 (two) times daily.   FREESTYLE LITE test strip Generic drug: glucose blood To be used as directed for checking her blood sugar   gabapentin 400 MG capsule Commonly known as: NEURONTIN Take 1 capsule (400 mg total) by mouth at bedtime.   HumaLOG KwikPen 100 UNIT/ML KwikPen Generic drug: insulin lispro INJECT 5-10  UNITS INTO THE SKIN BEFORE MEALS PER SLIDING SCALE. IF BLOOD SUGAR IS 120-250=5 UNITS; 251-350=8 UNITS; OVER 350=10 UNITS   insulin glargine 100 UNIT/ML injection Commonly known as: LANTUS Inject 0.3 mLs (30 Units total) into the skin 2 (two) times daily. What changed:   how much to take  when to take this  Another medication with the same name was removed. Continue taking this medication, and follow the directions you see here.   Oxycodone HCl 10 MG Tabs Take 1 tablet (10 mg total) by mouth every other day for 10 days. During dressing changes.   senna-docusate 8.6-50 MG tablet Commonly known as: Senokot-S Take 2 tablets by mouth 2 (two) times daily as needed for mild constipation.            Discharge Care Instructions  (From admission, onward)         Start     Ordered   03/08/20 0000  Discharge wound care:       Comments: Continue dressing change as instructed by your nurse.   03/08/20 1201           Follow-up Information    Iloabachie, [name], NP. Go on 03/15/2020.   Specialty: Gerontology Why: 1:30pm appointment Contact information: [address] [place] [address] [phone]        Anson Oregon, PA-C. Schedule an appointment as soon as possible for a visit in 1 week.   Specialty: Physician Assistant Why: wound check. Contact information: [address] [place] [address] [phone]               Allergies  Allergen Reactions  . Bee Venom Anaphylaxis  . Ibuprofen Itching, Rash and Other (See Comments)    Other reaction(s): Other (See Comments) Stomach upset Reaction:  GI upset   . Tramadol Hives and Swelling  . Vancomycin Rash and Other (See Comments)    Reaction:  Red man's syndrome      The results of significant diagnostics from this hospitalization (including imaging, microbiology, ancillary and laboratory) are listed below for reference.    Consultations:   Procedures/Studies: DG Chest 2 View  Result Date: 03/03/2020 CLINICAL DATA:  Sepsis EXAM: CHEST - 2 VIEW COMPARISON:  September 12, 2019 FINDINGS: The cardiomediastinal silhouette is normal in contour. No pleural effusion. No pneumothorax. No acute pleuroparenchymal abnormality. Visualized abdomen is unremarkable. No acute osseous abnormality noted. IMPRESSION: No acute cardiopulmonary abnormality. Electronically Signed   By: Meda Klinefelter MD   On: 03/03/2020 14:21   CT HAND LEFT W CONTRAST  Result Date: 03/03/2020 CLINICAL DATA:  Left-handed pain erythema IV drug abuse EXAM: CT OF THE UPPER LEFT EXTREMITY WITHOUT CONTRAST TECHNIQUE: Multidetector CT imaging of the upper left extremity was performed according to the standard protocol. COMPARISON:  Radiograph same day FINDINGS: Bones/Joint/Cartilage No fracture or dislocation. The articular surfaces appear to be maintained. No large joint effusions are noted. Ligaments Suboptimally assessed by CT.  Muscles and Tendons There is a multilocular fluid collection seen overlying the radial aspect of the wrist at the base of the first [facility] joint measuring 2.1 x 0.9 x 3.2 cm. The collection appears to abut surface of the extensor compartment 1 which as peripheral enhancement and a small amount of fluid seen within it. There is diffuse overlying edema containing within today myotendinous junction of the abductor pollicis longus. The remainder of the flexor and extensor tendons appear to be intact. Soft tissues As described above multilocular fluid collection overlying extensor compartment 1. There is diffuse skin thickening and subcutaneous edema seen around the radial aspect of the wrist. No subcutaneous emphysema is seen. IMPRESSION: Multilocular collection, likely soft tissue abscess overlying the radial aspect of the wrist measuring 2.1x0.9 x 3.2 cm. The collection abuts the surface of the extensor compartment 1 where there is findings  likely consistent with tenosynovitis, infectious or inflammatory. Electronically Signed   By: Jonna Clark M.D.   On: 03/03/2020 18:49   DG Hand Complete Left  Result Date: 03/03/2020 CLINICAL DATA:  Left hand pain, erythema EXAM: LEFT HAND - COMPLETE 3+ VIEW COMPARISON:  None. FINDINGS: Normal alignment. No fracture or dislocation. No destructive osseous lesion. No retained radiopaque foreign body. Diffuse soft tissue swelling is noted of the left hand and visualized left wrist. IMPRESSION: Soft tissue swelling without acute osseous abnormality. Electronically Signed   By: Helyn Numbers MD   On: 03/03/2020 17:19      Labs: BNP (last 3 results) No results for input(s): BNP in the last 8760 hours. Basic Metabolic Panel: Recent Labs  Lab 03/03/20 1913 03/04/20 0240 03/04/20 1348 03/05/20 1304 03/06/20 0441 03/07/20 0526 03/08/20 0547  NA 133*   < > 128* 131* 130* 127* 128*  K 3.7   < > 3.8 3.9 4.4 4.7 5.0  CL 96*   < > 95* 98 96* 93* 93*  CO2 28   < > GLUCOSE 442*   < > 214* 247* 397* 439* 448*  BUN 23*   < > 29*  CREATININE 1.07   < > 0.76 1.02 0.76 0.87 0.98  CALCIUM 8.5*   < > 8.1* 8.0* 8.2* 8.4* 9.1  MG 1.9  --   --  1.7  --   --   --   PHOS  --   --   --  1.6*  --   --   --    < > = values in this interval not displayed.   Liver Function Tests: Recent Labs  Lab 03/03/20 1357 03/05/20 1304  AST 36 36  ALT 48* 35  ALKPHOS 120 95  BILITOT 1.1 0.4  PROT 7.6 6.2*  ALBUMIN 3.8 2.7*   No results for input(s): LIPASE, AMYLASE in the last 168 hours. No results for input(s): AMMONIA in the last 168 hours. CBC: Recent Labs  Lab 03/03/20 1357 03/03/20 1913 03/04/20 0448 03/05/20 1304 03/06/20 0441 03/07/20 0526 03/08/20 0547  WBC 17.3*   < > 17.6* 13.5* 10.9* 8.5 8.7  NEUTROABS 13.5*  --   --  10.0* 7.7 5.3 5.1  HGB 11.1*   < > 10.1* 10.1* 10.3* 10.7* 10.8*  HCT 34.7*   < > 28.8* 30.1* 30.6* 31.9* 32.8*  MCV 88.3   < > 82.8 84.8 84.5  85.1 85.6  PLT 310   < > 263 271 279 304 331   < > = values in this interval  not displayed.   Cardiac Enzymes: No results for input(s): CKTOTAL, CKMB, CKMBINDEX, TROPONINI in the last 168 hours. BNP: Invalid input(s): POCBNP CBG: Recent Labs  Lab 03/07/20 1710 03/07/20 2127 03/08/20 0800 03/08/20 0810 03/08/20 1138  GLUCAP 121* 178* 426* 422* 237*   D-Dimer No results for input(s): DDIMER in the last 72 hours. Hgb A1c No results for input(s): HGBA1C in the last 72 hours. Lipid Profile No results for input(s): CHOL, HDL, LDLCALC, TRIG, CHOLHDL, LDLDIRECT in the last 72 hours. Thyroid function studies No results for input(s): TSH, T4TOTAL, T3FREE, THYROIDAB in the last 72 hours.  Invalid input(s): FREET3 Anemia work up No results for input(s): VITAMINB12, FOLATE, FERRITIN, TIBC, IRON, RETICCTPCT in the last 72 hours. Urinalysis    Component Value Date/Time   COLORURINE YELLOW (A) 03/03/2020 1613   APPEARANCEUR HAZY (A) 03/03/2020 1613   APPEARANCEUR Clear 07/08/2019 1825   LABSPEC 1.028 03/03/2020 1613   LABSPEC 1.026 04/06/2014 0751   PHURINE 5.0 03/03/2020 1613   GLUCOSEU >=500 (A) 03/03/2020 1613   GLUCOSEU >=500 04/06/2014 0751   HGBUR MODERATE (A) 03/03/2020 1613   BILIRUBINUR NEGATIVE 03/03/2020 1613   BILIRUBINUR Negative 07/08/2019 1825   BILIRUBINUR Negative 04/06/2014 0751   KETONESUR NEGATIVE 03/03/2020 1613   PROTEINUR NEGATIVE 03/03/2020 1613   NITRITE NEGATIVE 03/03/2020 1613   LEUKOCYTESUR NEGATIVE 03/03/2020 1613   LEUKOCYTESUR Negative 04/06/2014 0751   Sepsis Labs Invalid input(s): PROCALCITONIN,  WBC,  LACTICIDVEN Microbiology Recent Results (from the past 240 hour(s))  Culture, blood (Routine x 2)     Status: None   Collection Time: 03/03/20  2:02 PM   Specimen: BLOOD  Result Value Ref Range Status   Specimen Description BLOOD BLOOD RIGHT FOREARM  Final   Special Requests   Final    BOTTLES DRAWN AEROBIC AND ANAEROBIC Blood Culture  adequate volume   Culture   Final    NO GROWTH 5 DAYS Performed at [facility], [address], [place], [place] [address]    Report Status 03/08/2020 FINAL  Final  Culture, blood (Routine x 2)     Status: None   Collection Time: 03/03/20  4:13 PM   Specimen: BLOOD  Result Value Ref Range Status   Specimen Description BLOOD RIGHT ANTECUBITAL  Final   Special Requests   Final    BOTTLES DRAWN AEROBIC AND ANAEROBIC Blood Culture adequate volume   Culture   Final    NO GROWTH 5 DAYS Performed at [facility], [address] [place], [place] [address]    Report Status 03/08/2020 FINAL  Final  SARS Coronavirus 2 by RT PCR (hospital order, performed in [facility] hospital lab) Nasopharyngeal Nasopharyngeal Swab     Status: None   Collection Time: 03/03/20  5:10 PM   Specimen: Nasopharyngeal Swab  Result Value Ref Range Status   SARS Coronavirus 2 NEGATIVE NEGATIVE Final    Comment: (NOTE) SARS-CoV-2 target nucleic acids are NOT DETECTED.  The SARS-CoV-2 RNA is generally detectable in upper and lower respiratory specimens during the acute phase of infection. The lowest concentration of SARS-CoV-2 viral copies this assay can detect is 250 copies / mL. A negative result does not preclude SARS-CoV-2 infection and should not be used as the sole basis for treatment or other patient management decisions.  A negative result may occur with improper specimen collection / handling, submission of specimen other than nasopharyngeal swab, presence of viral mutation(s) within the areas targeted by this assay, and inadequate number of viral  copies (<250 copies / mL). A negative result must be combined with clinical observations, patient history, and epidemiological information.  Fact Sheet for Patients:   BoilerBrush.com.cyhttps://www.fda.gov/media/136312/download  Fact Sheet for Healthcare Providers: https://pope.com/https://www.fda.gov/media/136313/download  This test is not yet approved or  cleared by  the [place] FDA and has been authorized for detection and/or diagnosis of SARS-CoV-2 by FDA under an Emergency Use Authorization (EUA).  This EUA will remain in effect (meaning this test can be used) for the duration of the COVID-19 declaration under Section 564(b)(1) of the Act, 21 U.S.C. section 360bbb-3(b)(1), unless the authorization is terminated or revoked sooner.  Performed at [facility], [address] [place], [place] [address]   MRSA PCR Screening     Status: None   Collection Time: 03/04/20  1:48 PM   Specimen: Nasal Mucosa; Nasopharyngeal  Result Value Ref Range Status   MRSA by PCR NEGATIVE NEGATIVE Final    Comment:        The GeneXpert MRSA Assay (FDA approved for NASAL specimens only), is one component of a comprehensive MRSA colonization surveillance program. It is not intended to diagnose MRSA infection nor to guide or monitor treatment for MRSA infections. Performed at [facility], [address] [place], [place] [address]   Gram stain     Status: None   Collection Time: 03/04/20  4:44 PM   Specimen: PATH Other; Tissue  Result Value Ref Range Status   Specimen Description WOUND  Final   Special Requests NONE  Final   Gram Stain   Final    GRAM POSITIVE COCCI WBC SEEN MANY Performed at [facility], [address] [place], [place] [address]    Report Status 03/05/2020 FINAL  Final  Aerobic/Anaerobic Culture (surgical/deep wound)     Status: None (Preliminary result)   Collection Time: 03/04/20  4:44 PM   Specimen: PATH Other; Tissue  Result Value Ref Range Status   Specimen Description   Final    WOUND Performed at [facility], [address] [place], [place] [address]    Special Requests   Final    NONE Performed at [facility], [address] [place], [place] [address]    Gram Stain   Final    FEW WBC PRESENT, PREDOMINANTLY PMN RARE GRAM POSITIVE COCCI Performed at [facility], [address] [place], [place] [address]    Culture   Final    FEW STREPTOCOCCUS GROUP C Beta hemolytic streptococci are predictably susceptible to penicillin and other beta lactams. Susceptibility testing not routinely performed. NO ANAEROBES ISOLATED; CULTURE IN PROGRESS FOR 5 DAYS    Report Status PENDING  Incomplete     Total time spend on discharging this patient, including the last patient exam, discussing the hospital stay, instructions for ongoing care as it relates to all pertinent caregivers, as well as preparing the medical discharge records, prescriptions, and/or referrals as applicable, is 45 minutes.    Darlin Priestlyina Zephyr Ridley, MD  [facility] 03/08/2020, 2:12 PM  If 7PM-7AM, please contact night-coverage

## 2020-03-08 NOTE — Discharge Instructions (Signed)
-  Keep bulky dressing on the hand at all times. -Change dressing every other day while at home. -Keep the hand dry at all times at this time. -Take antibiotics as directed. -If you do not feel comfortable changing the dressing at home, leave dressing intact until you are re-evaluated in the clinic. -Follow-up with me next week for a skin check of the wrist.

## 2020-03-08 NOTE — Progress Notes (Signed)
CBG 426 per NT, repeat CBG 422 per this RN. MD paged.

## 2020-03-09 LAB — AEROBIC/ANAEROBIC CULTURE W GRAM STAIN (SURGICAL/DEEP WOUND)

## 2020-03-14 IMAGING — DX DG CHEST 1V PORT
1 series · 1 of 1 positions shown · non-contrast
Comparison: 04/11/2019

CLINICAL DATA: Weakness with vomiting as well as coughing.
Toothache.

EXAM:
PORTABLE CHEST 1 VIEW

[chest ap]
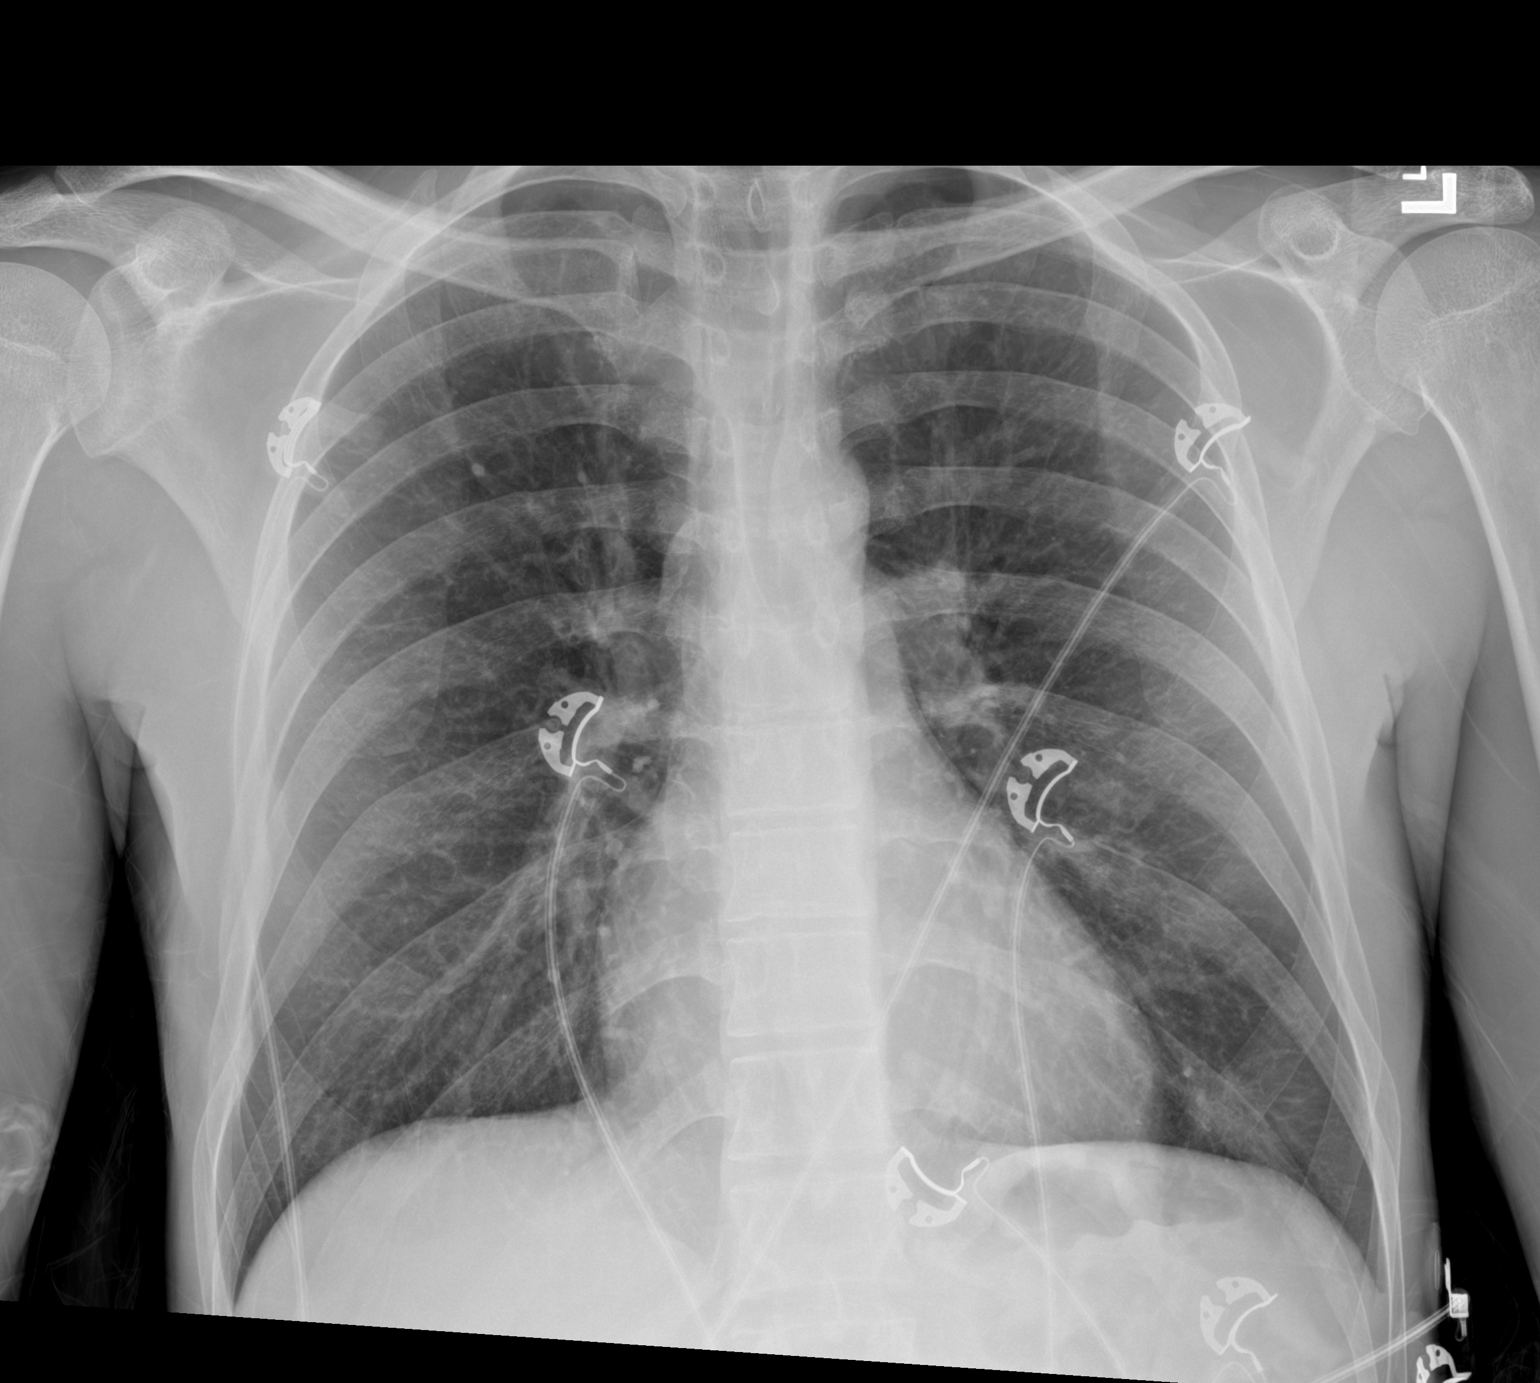

[1 of 1 positions shown; findings below may reference images not displayed]

FINDINGS: Lungs are adequately inflated without focal airspace consolidation
or effusion. Cardiomediastinal silhouette, bones and soft tissues
are normal.
IMPRESSION: No active disease.

## 2020-03-15 ENCOUNTER — Ambulatory Visit: Payer: Medicaid Other | Admitting: Gerontology

## 2020-03-28 ENCOUNTER — Telehealth: Payer: Self-pay | Admitting: Gerontology

## 2020-03-28 NOTE — Telephone Encounter (Signed)
-----   Message from [name] Iloabachie, NP sent at 03/21/2020  8:31 AM EDT ----- Pls schedule an in clinic F/U appointment for Mr Maclin. Pls make a telephone note. Thank you  

## 2020-03-30 ENCOUNTER — Telehealth: Payer: Self-pay | Admitting: Gerontology

## 2020-03-30 NOTE — Telephone Encounter (Signed)
-----   Message from Rolm Gala, NP sent at 03/21/2020  8:31 AM EDT ----- Pls schedule an in clinic F/U appointment for Mr Kittleson. Pls make a telephone note. Thank you

## 2020-03-30 NOTE — Telephone Encounter (Signed)
Call could not be completed @1 :50 pm on 03/30/20-KW

## 2020-08-11 ENCOUNTER — Other Ambulatory Visit: Payer: Self-pay | Admitting: Gerontology

## 2020-08-11 ENCOUNTER — Other Ambulatory Visit: Payer: Self-pay

## 2020-08-11 DIAGNOSIS — G629 Polyneuropathy, unspecified: Secondary | ICD-10-CM

## 2020-08-11 DIAGNOSIS — E1065 Type 1 diabetes mellitus with hyperglycemia: Secondary | ICD-10-CM

## 2020-08-16 ENCOUNTER — Other Ambulatory Visit: Payer: Self-pay | Admitting: Gerontology

## 2020-08-23 ENCOUNTER — Telehealth: Payer: Self-pay | Admitting: Gerontology

## 2020-08-23 NOTE — Telephone Encounter (Signed)
Called patient to inform of appointment on 2/9 at 11:00am but not able to leave a voicemail.

## 2020-08-23 NOTE — Telephone Encounter (Signed)
Called patient to inform of appointment on

## 2020-08-30 ENCOUNTER — Ambulatory Visit: Payer: Medicaid Other | Admitting: Gerontology

## 2020-08-31 ENCOUNTER — Telehealth: Payer: Self-pay | Admitting: Gerontology

## 2020-08-31 NOTE — Telephone Encounter (Signed)
Tried calling on 2/10 to reschedule appt but no answer and was unable to LVM

## 2020-08-31 NOTE — Telephone Encounter (Signed)
-----   Message from Rolm Gala, NP sent at 08/31/2020  9:38 AM EST ----- Trevor Brown no showed to his appointment, pls call and reschedule him, make telephone note. Thank you

## 2020-09-05 ENCOUNTER — Telehealth: Payer: Self-pay | Admitting: Gerontology

## 2020-09-05 NOTE — Telephone Encounter (Signed)
Pt missed appointment on 2/10 and was called the same day to reschedule but did not answer. Just called again on 2/15 but I was unable to leave a voice mail.

## 2020-09-07 ENCOUNTER — Telehealth: Payer: Self-pay | Admitting: Gerontology

## 2020-09-07 NOTE — Telephone Encounter (Signed)
Called pt @4 :30 pm on 09/07/20. Call was unable to be completed.09/09/20

## 2020-09-12 ENCOUNTER — Telehealth: Payer: Self-pay

## 2020-09-12 NOTE — Telephone Encounter (Signed)
Tried calling to r/s appt

## 2020-09-14 ENCOUNTER — Telehealth: Payer: Self-pay | Admitting: Gerontology

## 2020-09-14 NOTE — Telephone Encounter (Signed)
Pt no showed an appt on 08/30/20. I tried calling pt to reschedule, but was unable to LVM.   Melissa Denish 09/14/20 @ 3:00

## 2020-09-27 ENCOUNTER — Telehealth: Payer: Self-pay | Admitting: Pharmacist

## 2020-09-27 NOTE — Telephone Encounter (Signed)
Patient failed to provide requested 2022 financial documentation. Unable to determine patient's eligibility status. No additional medication assistance will be provided by [facility] without the required proof of income documentation. Patient notified by letter.  Vonda Henderson [facility] Administrative Assistant 

## 2020-10-04 ENCOUNTER — Telehealth: Payer: Self-pay | Admitting: Pharmacy Technician

## 2020-10-04 NOTE — Telephone Encounter (Signed)
Received returned mail for patient.  Attempted to call patient.  Unable to reach.  Received message stating call could not be completed.  Patient needs to provide updated financial information for 2022 to [facility] for re-certification to receive on-going medication assistance.  Sherilyn Dacosta Care Manager [facility]

## 2020-10-23 ENCOUNTER — Other Ambulatory Visit: Payer: Self-pay

## 2020-12-08 ENCOUNTER — Other Ambulatory Visit: Payer: Self-pay

## 2020-12-28 ENCOUNTER — Other Ambulatory Visit: Payer: Self-pay | Admitting: Gerontology

## 2020-12-28 ENCOUNTER — Ambulatory Visit: Payer: Medicaid Other | Admitting: Gerontology

## 2020-12-28 ENCOUNTER — Other Ambulatory Visit: Payer: Self-pay

## 2020-12-28 ENCOUNTER — Other Ambulatory Visit: Payer: Medicaid Other

## 2020-12-28 VITALS — BP 107/68 | HR 76 | Temp 98.3°F | Ht 67.13 in | Wt 118.7 lb

## 2020-12-28 DIAGNOSIS — E1065 Type 1 diabetes mellitus with hyperglycemia: Secondary | ICD-10-CM

## 2020-12-28 DIAGNOSIS — Z0000 Encounter for general adult medical examination without abnormal findings: Secondary | ICD-10-CM

## 2020-12-28 DIAGNOSIS — G629 Polyneuropathy, unspecified: Secondary | ICD-10-CM

## 2020-12-28 LAB — POCT GLYCOSYLATED HEMOGLOBIN (HGB A1C)
HbA1c POC (<> result, manual entry): 10.7 % (ref 4.0–5.6)
HbA1c, POC (controlled diabetic range): 0 % (ref 0.0–7.0)
HbA1c, POC (prediabetic range): 0 % — AB (ref 5.7–6.4)
Hemoglobin A1C: 10.7 % — AB (ref 4.0–5.6)

## 2020-12-28 MED ORDER — INSULIN LISPRO (1 UNIT DIAL) 100 UNIT/ML (KWIKPEN)
PEN_INJECTOR | SUBCUTANEOUS | 1 refills | Status: DC
  Filled 2020-12-28: qty 15, fill #0

## 2020-12-28 MED ORDER — "BD VEO INSULIN SYRINGE U/F 31G X 15/64"" 0.3 ML MISC"
99 refills | Status: DC
  Filled 2020-12-28: qty 30, fill #0

## 2020-12-28 MED ORDER — FREESTYLE LITE TEST VI STRP: ORAL_STRIP | 5 refills | Status: DC

## 2020-12-28 MED ORDER — GABAPENTIN 400 MG PO CAPS
ORAL_CAPSULE | Freq: Every day | ORAL | 1 refills | Status: DC
  Filled 2020-12-28: qty 30, 30d supply, fill #0

## 2020-12-28 MED ORDER — INSULIN GLARGINE 100 UNIT/ML ~~LOC~~ SOLN: 30.0000 [IU] | Freq: Two times a day (BID) | SUBCUTANEOUS | Status: DC

## 2020-12-28 NOTE — Patient Instructions (Signed)
Diabetes Mellitus and Sick Day Management Blood sugar (glucose) can be difficult to control when you are sick. Common illnesses that can cause problems for people with diabetes (diabetes mellitus) include colds, fever, flu (influenza), nausea, vomiting, and diarrhea. These illnesses can cause stress and loss of body fluids (dehydration), and those issues can cause blood glucose levels to increase. Because of this, it is very important to take your insulin and diabetes medicines and eatsome form of carbohydrate when you are sick. You should make a plan for days when you are sick (sick day plan) as part of your diabetes management plan. You and your health care provider should make this plan in advance. The following guidelines are intended to help you manage an illness that lasts for about 24 hours or less. Your health careprovider may also give you more specific instructions. How to manage your blood glucose  Check your blood glucose every 2-4 hours, or as often as told by your health care provider. If you use insulin, take your usual dose. If your blood glucose continues to be too high, you may need to take an additional insulin dose as told by your health care provider. Know your sick day treatment goals. Your target blood glucose levels may be different when you are sick. If you use oral diabetes medicine, continue to take your medicines. Have a plan with your health care provider for these medicines while you are sick. If you use injectable hormone medicines other than insulin to control your diabetes, have a plan with your health care provider for these medicines while you are sick. Follow these instructions at home Check your ketones If you have type 1 diabetes, check your urine ketones every 4 hours. If you have type 2 diabetes, check your urine ketones as often as told by your health care provider. Eating and drinking Drink enough fluid to keep your urine pale yellow. This is especially  important if you have a fever, vomiting, or diarrhea. Those symptoms can lead to dehydration. Follow instructions from your health care provider about beverages to avoid. Do not drink alcohol, caffeine, or drinks that contain a lot of sugar. You need to eat some form of carbohydrates when you are sick. Eat 45-50 grams (45-50 g) of carbohydrates every 3-4 hours until you feel better. All of the food choices below contain about 15 g of carbohydrates. Plan ahead and keep some of these foods around so you have them if you get sick. 4-6 oz (120-177 mL) carbonated beverage that contains sugar, such as regular (not diet) soda. You may be able to drink carbonated beverages more easily if you open the beverage and let it sit at room temperature for a few minutes before drinking.  of a twin frozen ice pop. 4 oz (120 g) regular gelatin. 4 oz (120 mL) fruit juice. 4 oz (120 g) ice cream or frozen yogurt. 2 oz (60 g) sherbet. 1 slice bread or toast. 6 saltine crackers. 5 vanilla wafers. Medicines Take-over-the-counter and prescription medicines only as told by your health care provider. Check medicine labels for added sugars. Some medicines may contain sugar or types of sugars that can raise your blood glucose level. Questions to ask your health care provider Should I adjust my diabetes medicines? How often do I need to check my blood glucose? What supplies do I need to manage my diabetes at home when I am sick? What number can I call if I have questions? What foods and drinks should I avoid?   Contact a health care provider if: You have been sick or have had a fever for 2 days or longer and you are not getting better. Your blood glucose is at or above 240 mg/dl (13.3 mmol/L), even after you take an additional insulin dose. You are unable to drink fluids without vomiting. You have any of the following for more than 6 hours: Nausea. Vomiting. Diarrhea. Get help right away if: You have difficulty  breathing. You have moderate or high ketone levels in your urine. You have a change in how you think, feel, or act (mental status). You develop symptoms of diabetic ketoacidosis. These include: Nausea. Vomiting. Excessive thirst. Excessive urination. Fruity or sweet smelling breath. Rapid breathing. Pain in the abdomen. Your blood glucose is lower than 54mg/dl (3.0 mmol/L). You used emergency glucagon to treat low blood glucose. These symptoms may represent a serious problem that is an emergency. Do not wait to see if the symptoms will go away. Get medical help right away. Call your local emergency services (911 in the [place] Do not drive yourself to the hospital. Summary Blood sugar (glucose) can be difficult to control when you are sick. Common illnesses that can cause problems for people with diabetes (diabetes mellitus) include colds, fever, flu (influenza), nausea, vomiting, and diarrhea. Illnesses can cause stress and loss of body fluids (dehydration), and those issues can cause blood glucose levels to increase. Make a plan for days when you are sick (sick day plan) as part of your diabetes management plan. You and your health care provider should make this plan in advance. It is very important to take your insulin and diabetes medicines and to eat some form of carbohydrate when you are sick. Contact your health care provider if have problems managing your blood glucose levels when you are sick, or if you have been sick or had a fever for 2 days or longer and are not getting better. This information is not intended to replace advice given to you by your health care provider. Make sure you discuss any questions you have with your healthcare provider. Document Revised: 07/29/2019 Document Reviewed: 07/29/2019 Elsevier Patient Education  2022 [employer] Inc.  

## 2020-12-28 NOTE — Progress Notes (Deleted)
Established Patient Office Visit  Subjective:  Patient ID: Trevor Brown, male    DOB: [birth year]  Age: 30 y.o. MRN: [id number]  CC: No chief complaint on file.   HPI DEADRICK STIDD presents for ***  Past Medical History:  Diagnosis Date   Heart attack (HCC)    Pt claims he had heart attack a year ago ( 2015)- and was admitted in [facility] for that, but not given any meds or angiogram, on review of chart- I could not find any details like that.   Hepatitis C, acute may 2016   Heroin abuse (HCC)    History of noncompliance with medical treatment    Hypertension    Type 1 diabetes [facility])     Past Surgical History:  Procedure Laterality Date   INCISION AND DRAINAGE ABSCESS Left 03/04/2020   Procedure: INCISION AND DRAINAGE ABSCESS;  Surgeon: Christena Flake, MD;  Location: [facility] ORS;  Service: Orthopedics;  Laterality: Left;   NO PAST SURGERIES     none      Family History  Problem Relation Age of Onset   Cirrhosis Mother    Diabetes Mellitus II Maternal Grandmother    Diabetes Mellitus II Maternal Grandfather    CAD Father     Social History   Socioeconomic History   Marital status: Single    Spouse name: Not on file   Number of children: Not on file   Years of education: Not on file   Highest education level: Not on file  Occupational History   Occupation: tree cutter  Tobacco Use   Smoking status: Every Day    Packs/day: 2.00    Pack years: 0.00    Types: Cigarettes   Smokeless tobacco: Never  Vaping Use   Vaping Use: Never used  Substance and Sexual Activity   Alcohol use: No    Alcohol/week: 0.0 standard drinks   Drug use: Not Currently    Types: IV, Heroin, Marijuana    Comment: heroin-    Sexual activity: Yes  Other Topics Concern   Not on file  Social History Narrative   Not on file   Social Determinants of Health   Financial Resource Strain: Not on file  Food Insecurity: Not on file  Transportation Needs: Not on file  Physical Activity: Not on  file  Stress: Not on file  Social Connections: Not on file  Intimate Partner Violence: Not on file    Outpatient Medications Prior to Visit  Medication Sig Dispense Refill   BD VEO INSULIN SYRINGE U/F 31G X 15/64" 0.3 ML MISC AS DIRECTED WITH LANTUS 30 each 99   COMFORT EZ PEN NEEDLES 32G X 4 MM MISC AS DIRECTED WITH HUMALOG KWIKPEN 100 each 99   famotidine (PEPCID) 20 MG tablet Take 20 mg by mouth 2 (two) times daily.     gabapentin (NEURONTIN) 400 MG capsule TAKE ONE CAPSULE BY MOUTH AT BEDTIME 30 capsule 1   glucose blood (FREESTYLE LITE) test strip To be used as directed for checking her blood sugar 60 each 5   HUMALOG KWIKPEN 100 UNIT/ML KwikPen INJECT 5-10 UNITS INTO THE SKIN BEFORE MEALS PER SLIDING SCALE. IF BLOOD SUGAR IS 120-250=5 UNITS; 251-350=8 UNITS; OVER 350=10 UNITS (Patient not taking: Reported on 03/03/2020) 15 mL 0   insulin glargine (LANTUS) 100 UNIT/ML injection Inject 0.3 mLs (30 Units total) into the skin 2 (two) times daily.     insulin lispro (HUMALOG) 100 UNIT/ML KwikPen INJECT 5-10 UNITS INTO  THE SKIN BEFORE MEALS PER SLIDING SCALE. IF BLOOD SUGAR IS 120-250=5 UNITS;251-350=8 UNITS; OVER 350=10 UNITS 15 mL 1   LANTUS 100 UNIT/ML injection INJECT 25 UNITS UNDER THE SKIN ONCE DAILY. 40 mL 0   senna-docusate (SENOKOT-S) 8.6-50 MG tablet Take 2 tablets by mouth 2 (two) times daily as needed for mild constipation.     No facility-administered medications prior to visit.    Allergies  Allergen Reactions   Bee Venom Anaphylaxis   Ibuprofen Itching, Rash and Other (See Comments)    Other reaction(s): Other (See Comments) Stomach upset Reaction:  GI upset    Tramadol Hives and Swelling   Vancomycin Rash and Other (See Comments)    Reaction:  Red man's syndrome     ROS Review of Systems    Objective:    Physical Exam  There were no vitals taken for this visit. Wt Readings from Last 3 Encounters:  03/03/20 130 lb (59 kg)  01/18/20 111 lb 9.6 oz (50.6 kg)   10/29/19 120 lb (54.4 kg)     Health Maintenance Due  Topic Date Due   PNEUMOCOCCAL POLYSACCHARIDE VACCINE AGE 59-64 HIGH RISK  Never done   COVID-19 Vaccine (1) Never done   Pneumococcal Vaccine 50-70 Years old (1 - PCV) Never done   OPHTHALMOLOGY EXAM  Never done   TETANUS/TDAP  Never done   URINE MICROALBUMIN  03/02/2020   FOOT EXAM  03/08/2020   HEMOGLOBIN A1C  09/03/2020    There are no preventive care reminders to display for this patient.  Lab Results  Component Value Date   TSH 1.170 03/03/2019   Lab Results  Component Value Date   WBC 8.7 03/08/2020   HGB 10.8 (L) 03/08/2020   HCT 32.8 (L) 03/08/2020   MCV 85.6 03/08/2020   PLT 331 03/08/2020   Lab Results  Component Value Date   NA 128 (L) 03/08/2020   K 5.0 03/08/2020   CO2 25 03/08/2020   GLUCOSE 448 (H) 03/08/2020   BUN 29 (H) 03/08/2020   CREATININE 0.98 03/08/2020   BILITOT 0.4 03/05/2020   ALKPHOS 95 03/05/2020   AST 36 03/05/2020   ALT 35 03/05/2020   PROT 6.2 (L) 03/05/2020   ALBUMIN 2.7 (L) 03/05/2020   CALCIUM 9.1 03/08/2020   ANIONGAP 10 03/08/2020   Lab Results  Component Value Date   CHOL 90 (L) 03/03/2019   Lab Results  Component Value Date   HDL 29 (L) 03/03/2019   Lab Results  Component Value Date   LDLCALC 49 03/03/2019   Lab Results  Component Value Date   TRIG 60 03/03/2019   Lab Results  Component Value Date   CHOLHDL 3.1 03/03/2019   Lab Results  Component Value Date   HGBA1C 11.1 (H) 03/03/2020      Assessment & Plan:   Problem List Items Addressed This Visit   None   No orders of the defined types were placed in this encounter.   Follow-up: No follow-ups on file.    Rachelanne Whidby Trellis Paganini, NP

## 2020-12-28 NOTE — Progress Notes (Signed)
[facility]   Progress Note: General Provider: Wolfgang Phoenix, NP  SUBJECTIVE:   Trevor Brown is a 30 y.o. male who  has a past medical history of Heart attack ([place]), Hepatitis C, acute (may 2016), Heroin abuse ([place]), History of noncompliance with medical treatment, Hypertension, and Type 1 diabetes ([place]).. Patient presents today for Medication Refill (Insulin, gabapentin refill, test strips, test meter ) He states that he was diagnosed with Type I diabetes at age 13. He reports checking his blood glucose 3-4 times daily but didn't bring his log. He reduced Lantus to 25 units BID a week ago when he had one hypoglycemia episode in the mid 58s. He states that he lost his insulin vials and glucometer yesterday at work and hasn't had insulin since 1 pm on 12/27/20. Blood glucose during this visit was 140 mg/dl and HgA1c 10.7%. He also reports taking insulin without checking his blood glucose. He states that his meter read High a few days ago due to eating candy and drinking soda, and his blood glucose improved to 120 mg/dl after he had 12 units of Humalog. He reports that gabapentin has been effective for his neuropathy.   He smokes two packs a day and is not interested in quitting. In addition, he refused the Covid 19 vaccine despite explaining the vaccines are safe and effective and prevent severe illness and death. Overall, he states that he's doing well and offers no further complaint Review of Systems  Constitutional: Negative.   HENT: Negative.    Eyes: Negative.   Respiratory: Negative.    Cardiovascular: Negative.   Gastrointestinal: Negative.   Genitourinary: Negative.   Musculoskeletal: Negative.   Skin: Negative.   Neurological: Negative.   Endo/Heme/Allergies: Negative.   Psychiatric/Behavioral: Negative.      OBJECTIVE: BP 107/68 (BP Location: Left Arm, Patient Position: Sitting, Cuff Size: Normal)   Pulse 76   Temp 98.3 F (36.8 C)   Ht 5' 7.13" (1.705  m)   Wt 118 lb 11.2 oz (53.8 kg)   BMI 18.52 kg/m   Wt Readings from Last 3 Encounters:  12/28/20 118 lb 11.2 oz (53.8 kg)  03/03/20 130 lb (59 kg)  01/18/20 111 lb 9.6 oz (50.6 kg)     Physical Exam Vitals reviewed.  HENT:     Head: Normocephalic.     Nose: Nose normal.     Mouth/Throat:     Mouth: Mucous membranes are moist.  Cardiovascular:     Rate and Rhythm: Normal rate and regular rhythm.     Pulses: Normal pulses.     Heart sounds: Normal heart sounds.  Pulmonary:     Effort: Pulmonary effort is normal.  Abdominal:     General: Abdomen is flat.     Palpations: Abdomen is soft.  Musculoskeletal:        General: Normal range of motion.  Skin:    General: Skin is warm and dry.  Neurological:     General: No focal deficit present.     Mental Status: He is oriented to person, place, and time. Mental status is at baseline.   ASSESSMENT/PLAN:  1. Type 1 diabetes mellitus with hyperglycemia (HCC) - Advised to take Lantus 30 units BID instead of 25 units since HgbA1c is 10.7% - His HgbA1c was 10.7% and his goal should be less than 6 %. He will resume current treatment regime. Advised to check blood glucose fasting and before administering insulin. Advised to bring blood glucose  log to the next appointment. Fasting blood glucose should be between 80 mg/dl to 150m/dl -He is advised to follow on low carb/a low concentrated sweets diet and exercise as tolerated. -Urine Microalbumin w/creat. ratio - POCT HgB A1C; Future 2. Health care maintenance - Lipid panel; Future - CBC w/Diff - Comp Met (CMET) - Urinalysis; Future  3. Peripheral polyneuropathy -gabapentin 400 mg at bed time   Return in about 4 weeks (around 01/25/2021), or if symptoms worsen or fail to improve, for lab follow up.    The patient was given clear instructions to go to ER or return to medical center if symptoms do not improve, worsen or new problems develop. The patient verbalized understanding and  agreed with plan of care.  Henson Fraticelli, [place]

## 2020-12-29 ENCOUNTER — Other Ambulatory Visit: Payer: Self-pay

## 2020-12-31 ENCOUNTER — Encounter: Payer: Self-pay | Admitting: Gerontology

## 2020-12-31 ENCOUNTER — Other Ambulatory Visit: Payer: Self-pay

## 2021-01-01 ENCOUNTER — Other Ambulatory Visit: Payer: Self-pay

## 2021-01-05 ENCOUNTER — Other Ambulatory Visit: Payer: Self-pay

## 2021-01-05 ENCOUNTER — Inpatient Hospital Stay
Admission: EM | Admit: 2021-01-05 | Discharge: 2021-01-08 | DRG: 638 | Disposition: A | Discharge status: Home / Self Care | Payer: Self-pay | Attending: Internal Medicine | Admitting: Internal Medicine

## 2021-01-05 ENCOUNTER — Emergency Department: Payer: Self-pay

## 2021-01-05 DIAGNOSIS — T383X6A Underdosing of insulin and oral hypoglycemic [antidiabetic] drugs, initial encounter: Secondary | ICD-10-CM | POA: Diagnosis present

## 2021-01-05 DIAGNOSIS — R109 Unspecified abdominal pain: Secondary | ICD-10-CM | POA: Diagnosis present

## 2021-01-05 DIAGNOSIS — Z9114 Patient's other noncompliance with medication regimen: Secondary | ICD-10-CM

## 2021-01-05 DIAGNOSIS — E1110 Type 2 diabetes mellitus with ketoacidosis without coma: Secondary | ICD-10-CM | POA: Diagnosis present

## 2021-01-05 DIAGNOSIS — IMO0002 Reserved for concepts with insufficient information to code with codable children: Secondary | ICD-10-CM | POA: Diagnosis present

## 2021-01-05 DIAGNOSIS — Z79899 Other long term (current) drug therapy: Secondary | ICD-10-CM

## 2021-01-05 DIAGNOSIS — Z91030 Bee allergy status: Secondary | ICD-10-CM

## 2021-01-05 DIAGNOSIS — F1410 Cocaine abuse, uncomplicated: Secondary | ICD-10-CM | POA: Diagnosis present

## 2021-01-05 DIAGNOSIS — I10 Essential (primary) hypertension: Secondary | ICD-10-CM | POA: Diagnosis present

## 2021-01-05 DIAGNOSIS — R7401 Elevation of levels of liver transaminase levels: Secondary | ICD-10-CM | POA: Diagnosis present

## 2021-01-05 DIAGNOSIS — T368X5A Adverse effect of other systemic antibiotics, initial encounter: Secondary | ICD-10-CM | POA: Diagnosis present

## 2021-01-05 DIAGNOSIS — R739 Hyperglycemia, unspecified: Secondary | ICD-10-CM

## 2021-01-05 DIAGNOSIS — I2510 Atherosclerotic heart disease of native coronary artery without angina pectoris: Secondary | ICD-10-CM | POA: Diagnosis present

## 2021-01-05 DIAGNOSIS — R079 Chest pain, unspecified: Secondary | ICD-10-CM | POA: Diagnosis present

## 2021-01-05 DIAGNOSIS — Z886 Allergy status to analgesic agent status: Secondary | ICD-10-CM

## 2021-01-05 DIAGNOSIS — Z20822 Contact with and (suspected) exposure to covid-19: Secondary | ICD-10-CM | POA: Diagnosis present

## 2021-01-05 DIAGNOSIS — Z720 Tobacco use: Secondary | ICD-10-CM | POA: Diagnosis present

## 2021-01-05 DIAGNOSIS — R6510 Systemic inflammatory response syndrome (SIRS) of non-infectious origin without acute organ dysfunction: Secondary | ICD-10-CM | POA: Diagnosis present

## 2021-01-05 DIAGNOSIS — T40425A Adverse effect of tramadol, initial encounter: Secondary | ICD-10-CM | POA: Diagnosis present

## 2021-01-05 DIAGNOSIS — Z8249 Family history of ischemic heart disease and other diseases of the circulatory system: Secondary | ICD-10-CM

## 2021-01-05 DIAGNOSIS — L270 Generalized skin eruption due to drugs and medicaments taken internally: Secondary | ICD-10-CM | POA: Diagnosis present

## 2021-01-05 DIAGNOSIS — M79604 Pain in right leg: Secondary | ICD-10-CM | POA: Diagnosis present

## 2021-01-05 DIAGNOSIS — E1065 Type 1 diabetes mellitus with hyperglycemia: Secondary | ICD-10-CM

## 2021-01-05 DIAGNOSIS — R112 Nausea with vomiting, unspecified: Secondary | ICD-10-CM

## 2021-01-05 DIAGNOSIS — Z794 Long term (current) use of insulin: Secondary | ICD-10-CM

## 2021-01-05 DIAGNOSIS — G629 Polyneuropathy, unspecified: Secondary | ICD-10-CM

## 2021-01-05 DIAGNOSIS — L500 Allergic urticaria: Secondary | ICD-10-CM | POA: Diagnosis present

## 2021-01-05 DIAGNOSIS — F1910 Other psychoactive substance abuse, uncomplicated: Secondary | ICD-10-CM | POA: Diagnosis present

## 2021-01-05 DIAGNOSIS — K219 Gastro-esophageal reflux disease without esophagitis: Secondary | ICD-10-CM | POA: Diagnosis present

## 2021-01-05 DIAGNOSIS — Z833 Family history of diabetes mellitus: Secondary | ICD-10-CM

## 2021-01-05 DIAGNOSIS — Z8619 Personal history of other infectious and parasitic diseases: Secondary | ICD-10-CM

## 2021-01-05 DIAGNOSIS — E1010 Type 1 diabetes mellitus with ketoacidosis without coma: Principal | ICD-10-CM | POA: Diagnosis present

## 2021-01-05 DIAGNOSIS — R0789 Other chest pain: Secondary | ICD-10-CM

## 2021-01-05 DIAGNOSIS — E875 Hyperkalemia: Secondary | ICD-10-CM | POA: Diagnosis present

## 2021-01-05 DIAGNOSIS — F1210 Cannabis abuse, uncomplicated: Secondary | ICD-10-CM | POA: Diagnosis present

## 2021-01-05 DIAGNOSIS — F419 Anxiety disorder, unspecified: Secondary | ICD-10-CM | POA: Diagnosis present

## 2021-01-05 DIAGNOSIS — I252 Old myocardial infarction: Secondary | ICD-10-CM

## 2021-01-05 DIAGNOSIS — Z881 Allergy status to other antibiotic agents status: Secondary | ICD-10-CM

## 2021-01-05 DIAGNOSIS — F17210 Nicotine dependence, cigarettes, uncomplicated: Secondary | ICD-10-CM | POA: Diagnosis present

## 2021-01-05 DIAGNOSIS — M79605 Pain in left leg: Secondary | ICD-10-CM | POA: Diagnosis present

## 2021-01-05 DIAGNOSIS — F1110 Opioid abuse, uncomplicated: Secondary | ICD-10-CM | POA: Diagnosis present

## 2021-01-05 DIAGNOSIS — Z9119 Patient's noncompliance with other medical treatment and regimen: Secondary | ICD-10-CM

## 2021-01-05 LAB — HEPATIC FUNCTION PANEL
ALT: 76 U/L — ABNORMAL HIGH (ref 0–44)
AST: 79 U/L — ABNORMAL HIGH (ref 15–41)
Albumin: 5 g/dL (ref 3.5–5.0)
Alkaline Phosphatase: 118 U/L (ref 38–126)
Bilirubin, Direct: 0.3 mg/dL — ABNORMAL HIGH (ref 0.0–0.2)
Indirect Bilirubin: 1.2 mg/dL — ABNORMAL HIGH (ref 0.3–0.9)
Total Bilirubin: 1.5 mg/dL — ABNORMAL HIGH (ref 0.3–1.2)
Total Protein: 8.8 g/dL — ABNORMAL HIGH (ref 6.5–8.1)

## 2021-01-05 LAB — URINALYSIS, COMPLETE (UACMP) WITH MICROSCOPIC
Bacteria, UA: NONE SEEN
Bilirubin Urine: NEGATIVE
Glucose, UA: >=500 mg/dL — AB
Hgb urine dipstick: NEGATIVE
Ketones, ur: 80 mg/dL — AB
Leukocytes,Ua: NEGATIVE
Nitrite: NEGATIVE
Protein, ur: NEGATIVE mg/dL
Specific Gravity, Urine: 1.031 — ABNORMAL HIGH (ref 1.005–1.030)
Squamous Epithelial / HPF: NONE SEEN (ref 0–5)
pH: 5 (ref 5.0–8.0)

## 2021-01-05 LAB — URINE DRUG SCREEN, QUALITATIVE (ARMC ONLY)
Amphetamines, Ur Screen: POSITIVE — AB
Barbiturates, Ur Screen: NOT DETECTED
Benzodiazepine, Ur Scrn: NOT DETECTED
Cannabinoid 50 Ng, Ur ~~LOC~~: POSITIVE — AB
Cocaine Metabolite,Ur ~~LOC~~: POSITIVE — AB
MDMA (Ecstasy)Ur Screen: NOT DETECTED
Methadone Scn, Ur: NOT DETECTED
Opiate, Ur Screen: NOT DETECTED
Phencyclidine (PCP) Ur S: NOT DETECTED
Tricyclic, Ur Screen: NOT DETECTED

## 2021-01-05 LAB — CBG MONITORING, ED
Glucose-Capillary: 462 mg/dL — ABNORMAL HIGH (ref 70–99)
Glucose-Capillary: 327 mg/dL — ABNORMAL HIGH (ref 70–99)
Glucose-Capillary: 273 mg/dL — ABNORMAL HIGH (ref 70–99)
Glucose-Capillary: 234 mg/dL — ABNORMAL HIGH (ref 70–99)
Glucose-Capillary: 183 mg/dL — ABNORMAL HIGH (ref 70–99)
Glucose-Capillary: 162 mg/dL — ABNORMAL HIGH (ref 70–99)
Glucose-Capillary: 154 mg/dL — ABNORMAL HIGH (ref 70–99)

## 2021-01-05 LAB — CBC WITH DIFFERENTIAL/PLATELET
Abs Immature Granulocytes: 0.07 10*3/uL (ref 0.00–0.07)
Basophils Absolute: 0.1 10*3/uL (ref 0.0–0.1)
Basophils Relative: 1 %
Eosinophils Absolute: 0.2 10*3/uL (ref 0.0–0.5)
Eosinophils Relative: 1 %
HCT: 46.6 % (ref 39.0–52.0)
Hemoglobin: 15.6 g/dL (ref 13.0–17.0)
Immature Granulocytes: 1 %
Lymphocytes Relative: 18 %
Lymphs Abs: 2.5 10*3/uL (ref 0.7–4.0)
MCH: 28.5 pg (ref 26.0–34.0)
MCHC: 33.5 g/dL (ref 30.0–36.0)
MCV: 85 fL (ref 80.0–100.0)
Monocytes Absolute: 0.8 10*3/uL (ref 0.1–1.0)
Monocytes Relative: 5 %
Neutro Abs: 10.6 10*3/uL — ABNORMAL HIGH (ref 1.7–7.7)
Neutrophils Relative %: 74 %
Platelets: 240 10*3/uL (ref 150–400)
RBC: 5.48 MIL/uL (ref 4.22–5.81)
RDW: 14 % (ref 11.5–15.5)
WBC: 14.2 10*3/uL — ABNORMAL HIGH (ref 4.0–10.5)
nRBC: 0 % (ref 0.0–0.2)

## 2021-01-05 LAB — MAGNESIUM: Magnesium: 1.8 mg/dL (ref 1.7–2.4)

## 2021-01-05 LAB — BASIC METABOLIC PANEL
Anion gap: 17 — ABNORMAL HIGH (ref 5–15)
Anion gap: 14 (ref 5–15)
Anion gap: 10 (ref 5–15)
BUN: 35 mg/dL — ABNORMAL HIGH (ref 6–20)
BUN: 33 mg/dL — ABNORMAL HIGH (ref 6–20)
BUN: 32 mg/dL — ABNORMAL HIGH (ref 6–20)
CO2: 22 mmol/L (ref 22–32)
CO2: 21 mmol/L — ABNORMAL LOW (ref 22–32)
CO2: 24 mmol/L (ref 22–32)
Calcium: 10.1 mg/dL (ref 8.9–10.3)
Calcium: 9 mg/dL (ref 8.9–10.3)
Calcium: 8.8 mg/dL — ABNORMAL LOW (ref 8.9–10.3)
Chloride: 90 mmol/L — ABNORMAL LOW (ref 98–111)
Chloride: 95 mmol/L — ABNORMAL LOW (ref 98–111)
Chloride: 99 mmol/L (ref 98–111)
Creatinine, Ser: 1.13 mg/dL (ref 0.61–1.24)
Creatinine, Ser: 1.04 mg/dL (ref 0.61–1.24)
Creatinine, Ser: 0.84 mg/dL (ref 0.61–1.24)
GFR, Estimated: >60 mL/min (ref >60)
GFR, Estimated: >60 mL/min (ref >60)
GFR, Estimated: >60 mL/min (ref >60)
Glucose, Bld: 491 mg/dL — ABNORMAL HIGH (ref 70–99)
Glucose, Bld: 334 mg/dL — ABNORMAL HIGH (ref 70–99)
Glucose, Bld: 186 mg/dL — ABNORMAL HIGH (ref 70–99)
Potassium: 5.3 mmol/L — ABNORMAL HIGH (ref 3.5–5.1)
Potassium: 4.3 mmol/L (ref 3.5–5.1)
Potassium: 3.4 mmol/L — ABNORMAL LOW (ref 3.5–5.1)
Sodium: 129 mmol/L — ABNORMAL LOW (ref 135–145)
Sodium: 130 mmol/L — ABNORMAL LOW (ref 135–145)
Sodium: 133 mmol/L — ABNORMAL LOW (ref 135–145)

## 2021-01-05 LAB — BETA-HYDROXYBUTYRIC ACID: Beta-Hydroxybutyric Acid: 2.94 mmol/L — ABNORMAL HIGH (ref 0.05–0.27)

## 2021-01-05 LAB — TROPONIN I (HIGH SENSITIVITY)
Troponin I (High Sensitivity): 4 ng/L (ref <18)
Troponin I (High Sensitivity): 4 ng/L (ref <18)
Troponin I (High Sensitivity): 5 ng/L (ref <18)

## 2021-01-05 LAB — RESP PANEL BY RT-PCR (FLU A&B, COVID) ARPGX2
Influenza A by PCR: NEGATIVE
Influenza B by PCR: NEGATIVE
SARS Coronavirus 2 by RT PCR: NEGATIVE

## 2021-01-05 LAB — LACTIC ACID, PLASMA
Lactic Acid, Venous: 3.3 mmol/L (ref 0.5–1.9)
Lactic Acid, Venous: 2.2 mmol/L (ref 0.5–1.9)
Lactic Acid, Venous: 1.5 mmol/L (ref 0.5–1.9)

## 2021-01-05 LAB — PROCALCITONIN: Procalcitonin: 0.1 ng/mL

## 2021-01-05 LAB — HIV ANTIBODY (ROUTINE TESTING W REFLEX): HIV Screen 4th Generation wRfx: NONREACTIVE

## 2021-01-05 LAB — GLUCOSE, CAPILLARY
Glucose-Capillary: 128 mg/dL — ABNORMAL HIGH (ref 70–99)
Glucose-Capillary: 368 mg/dL — ABNORMAL HIGH (ref 70–99)

## 2021-01-05 LAB — ETHANOL: Alcohol, Ethyl (B): <10 mg/dL (ref <10)

## 2021-01-05 LAB — APTT: aPTT: 31 seconds (ref 24–36)

## 2021-01-05 LAB — BRAIN NATRIURETIC PEPTIDE: B Natriuretic Peptide: 14.1 pg/mL (ref 0.0–100.0)

## 2021-01-05 LAB — AMMONIA: Ammonia: 23 umol/L (ref 9–35)

## 2021-01-05 LAB — PROTIME-INR
INR: 1.1 (ref 0.8–1.2)
Prothrombin Time: 14.2 seconds (ref 11.4–15.2)

## 2021-01-05 LAB — LIPASE, BLOOD: Lipase: 23 U/L (ref 11–51)

## 2021-01-05 MED ORDER — SODIUM CHLORIDE 0.9 % IV SOLN
INTRAVENOUS | Status: DC | PRN
  Administered 2021-01-05: 250 mL via INTRAVENOUS

## 2021-01-05 MED ORDER — INSULIN GLARGINE 100 UNIT/ML ~~LOC~~ SOLN
20.0000 [IU] | Freq: Two times a day (BID) | SUBCUTANEOUS | Status: DC
  Administered 2021-01-05 – 2021-01-06 (×3): 20 [IU] via SUBCUTANEOUS
  Filled 2021-01-05 (×4): qty 0.2

## 2021-01-05 MED ORDER — FAMOTIDINE 20 MG PO TABS: 20.0000 mg | ORAL_TABLET | Freq: Two times a day (BID) | ORAL | Status: DC

## 2021-01-05 MED ORDER — INSULIN ASPART 100 UNIT/ML IJ SOLN
10.0000 [IU] | Freq: Once | INTRAMUSCULAR | Status: AC
  Administered 2021-01-05: 10 [IU] via INTRAVENOUS
  Filled 2021-01-05: qty 1

## 2021-01-05 MED ORDER — ACETAMINOPHEN 500 MG PO TABS
1000.0000 mg | ORAL_TABLET | Freq: Once | ORAL | Status: AC
  Administered 2021-01-05: 1000 mg via ORAL
  Filled 2021-01-05: qty 2

## 2021-01-05 MED ORDER — IOHEXOL 300 MG/ML  SOLN
100.0000 mL | Freq: Once | INTRAMUSCULAR | Status: AC | PRN
  Administered 2021-01-05: 100 mL via INTRAVENOUS

## 2021-01-05 MED ORDER — GABAPENTIN 400 MG PO CAPS: 400.0000 mg | ORAL_CAPSULE | Freq: Every day | ORAL | Status: DC

## 2021-01-05 MED ORDER — LACTATED RINGERS IV SOLN: INTRAVENOUS | Status: DC

## 2021-01-05 MED ORDER — GABAPENTIN 400 MG PO CAPS
400.0000 mg | ORAL_CAPSULE | Freq: Two times a day (BID) | ORAL | Status: DC
  Administered 2021-01-05 – 2021-01-06 (×3): 400 mg via ORAL
  Filled 2021-01-05 (×4): qty 1

## 2021-01-05 MED ORDER — GABAPENTIN 400 MG PO CAPS: 400.0000 mg | ORAL_CAPSULE | Freq: Two times a day (BID) | ORAL | Status: DC

## 2021-01-05 MED ORDER — INSULIN ASPART 100 UNIT/ML IJ SOLN
0.0000 [IU] | Freq: Every day | INTRAMUSCULAR | Status: DC
  Administered 2021-01-05: 5 [IU] via SUBCUTANEOUS
  Administered 2021-01-06: 4 [IU] via SUBCUTANEOUS
  Administered 2021-01-07: 3 [IU] via SUBCUTANEOUS
  Filled 2021-01-05 (×3): qty 1

## 2021-01-05 MED ORDER — INSULIN REGULAR(HUMAN) IN NACL 100-0.9 UT/100ML-% IV SOLN: INTRAVENOUS | Status: DC

## 2021-01-05 MED ORDER — NICOTINE 21 MG/24HR TD PT24
21.0000 mg | MEDICATED_PATCH | Freq: Every day | TRANSDERMAL | Status: DC
  Administered 2021-01-05 – 2021-01-08 (×4): 21 mg via TRANSDERMAL
  Filled 2021-01-05 (×4): qty 1

## 2021-01-05 MED ORDER — LORAZEPAM 2 MG/ML IJ SOLN
1.0000 mg | Freq: Four times a day (QID) | INTRAMUSCULAR | Status: DC | PRN
  Administered 2021-01-05 – 2021-01-06 (×3): 1 mg via INTRAVENOUS
  Filled 2021-01-05 (×3): qty 1

## 2021-01-05 MED ORDER — VANCOMYCIN HCL 1250 MG/250ML IV SOLN
1250.0000 mg | Freq: Once | INTRAVENOUS | Status: AC
  Administered 2021-01-05: 1250 mg via INTRAVENOUS
  Filled 2021-01-05: qty 250

## 2021-01-05 MED ORDER — ASPIRIN EC 81 MG PO TBEC: 81.0000 mg | DELAYED_RELEASE_TABLET | Freq: Every day | ORAL | Status: DC

## 2021-01-05 MED ORDER — BLISTEX MEDICATED EX OINT
TOPICAL_OINTMENT | CUTANEOUS | Status: DC | PRN
  Filled 2021-01-05 (×2): qty 6.3

## 2021-01-05 MED ORDER — DEXTROSE IN LACTATED RINGERS 5 % IV SOLN: INTRAVENOUS | Status: DC

## 2021-01-05 MED ORDER — DEXTROSE 50 % IV SOLN: 0.0000 mL | INTRAVENOUS | Status: DC | PRN

## 2021-01-05 MED ORDER — LORAZEPAM 2 MG/ML IJ SOLN
0.5000 mg | Freq: Four times a day (QID) | INTRAMUSCULAR | Status: DC | PRN
  Administered 2021-01-05: 0.5 mg via INTRAVENOUS
  Filled 2021-01-05: qty 1

## 2021-01-05 MED ORDER — NITROGLYCERIN 0.4 MG SL SUBL: 0.4000 mg | SUBLINGUAL_TABLET | SUBLINGUAL | Status: DC | PRN

## 2021-01-05 MED ORDER — POTASSIUM CHLORIDE CRYS ER 20 MEQ PO TBCR
40.0000 meq | EXTENDED_RELEASE_TABLET | Freq: Once | ORAL | Status: AC
  Administered 2021-01-05: 40 meq via ORAL
  Filled 2021-01-05: qty 2

## 2021-01-05 MED ORDER — KETOROLAC TROMETHAMINE 30 MG/ML IJ SOLN
30.0000 mg | Freq: Once | INTRAMUSCULAR | Status: AC
  Administered 2021-01-05: 30 mg via INTRAVENOUS
  Filled 2021-01-05: qty 1

## 2021-01-05 MED ORDER — HYDRALAZINE HCL 20 MG/ML IJ SOLN
5.0000 mg | INTRAMUSCULAR | Status: DC | PRN
  Filled 2021-01-05: qty 1

## 2021-01-05 MED ORDER — SODIUM CHLORIDE 0.9 % IV SOLN
2.0000 g | Freq: Three times a day (TID) | INTRAVENOUS | Status: DC
  Administered 2021-01-05 – 2021-01-06 (×3): 2 g via INTRAVENOUS
  Filled 2021-01-05 (×5): qty 2

## 2021-01-05 MED ORDER — INSULIN REGULAR(HUMAN) IN NACL 100-0.9 UT/100ML-% IV SOLN
INTRAVENOUS | Status: DC
  Administered 2021-01-05: 8 [IU]/h via INTRAVENOUS
  Filled 2021-01-05: qty 100

## 2021-01-05 MED ORDER — KETOROLAC TROMETHAMINE 30 MG/ML IJ SOLN
30.0000 mg | Freq: Four times a day (QID) | INTRAMUSCULAR | Status: AC | PRN
  Administered 2021-01-05 – 2021-01-06 (×4): 30 mg via INTRAVENOUS
  Filled 2021-01-05 (×4): qty 1

## 2021-01-05 MED ORDER — INSULIN ASPART 100 UNIT/ML IJ SOLN
0.0000 [IU] | Freq: Three times a day (TID) | INTRAMUSCULAR | Status: DC
  Administered 2021-01-05: 2 [IU] via SUBCUTANEOUS
  Administered 2021-01-05 – 2021-01-06 (×2): 1 [IU] via SUBCUTANEOUS
  Administered 2021-01-06: 5 [IU] via SUBCUTANEOUS
  Administered 2021-01-06: 3 [IU] via SUBCUTANEOUS
  Administered 2021-01-07: 2 [IU] via SUBCUTANEOUS
  Administered 2021-01-07: 7 [IU] via SUBCUTANEOUS
  Administered 2021-01-07: 3 [IU] via SUBCUTANEOUS
  Administered 2021-01-08 (×2): 7 [IU] via SUBCUTANEOUS
  Filled 2021-01-05 (×9): qty 1

## 2021-01-05 MED ORDER — LINEZOLID 600 MG/300ML IV SOLN: 600.0000 mg | Freq: Two times a day (BID) | INTRAVENOUS | Status: DC

## 2021-01-05 MED ORDER — ONDANSETRON HCL 4 MG/2ML IJ SOLN
4.0000 mg | Freq: Once | INTRAMUSCULAR | Status: AC
  Administered 2021-01-05: 4 mg via INTRAVENOUS
  Filled 2021-01-05: qty 2

## 2021-01-05 MED ORDER — LIDOCAINE 5 % EX PTCH
1.0000 | MEDICATED_PATCH | CUTANEOUS | Status: DC
  Administered 2021-01-05: 1 via TRANSDERMAL
  Filled 2021-01-05 (×2): qty 1

## 2021-01-05 MED ORDER — LINEZOLID 600 MG/300ML IV SOLN
600.0000 mg | Freq: Once | INTRAVENOUS | Status: AC
  Administered 2021-01-05: 600 mg via INTRAVENOUS
  Filled 2021-01-05: qty 300

## 2021-01-05 MED ORDER — ACETAMINOPHEN 325 MG PO TABS: 650.0000 mg | ORAL_TABLET | Freq: Four times a day (QID) | ORAL | Status: DC | PRN

## 2021-01-05 MED ORDER — ENOXAPARIN SODIUM 40 MG/0.4ML IJ SOSY
40.0000 mg | PREFILLED_SYRINGE | INTRAMUSCULAR | Status: DC
  Administered 2021-01-05 – 2021-01-08 (×3): 40 mg via SUBCUTANEOUS
  Filled 2021-01-05 (×4): qty 0.4

## 2021-01-05 MED ORDER — SODIUM CHLORIDE 0.9 % IV SOLN
2.0000 g | Freq: Once | INTRAVENOUS | Status: AC
  Administered 2021-01-05: 2 g via INTRAVENOUS
  Filled 2021-01-05: qty 2

## 2021-01-05 MED ORDER — VANCOMYCIN HCL 750 MG/150ML IV SOLN
750.0000 mg | Freq: Two times a day (BID) | INTRAVENOUS | Status: DC
  Filled 2021-01-05 (×3): qty 150

## 2021-01-05 MED ORDER — LACTATED RINGERS IV BOLUS
1000.0000 mL | Freq: Once | INTRAVENOUS | Status: AC
  Administered 2021-01-05: 1000 mL via INTRAVENOUS

## 2021-01-05 MED ORDER — VANCOMYCIN HCL 750 MG/150ML IV SOLN: 750.0000 mg | Freq: Two times a day (BID) | INTRAVENOUS | Status: DC

## 2021-01-05 MED ORDER — ONDANSETRON HCL 4 MG/2ML IJ SOLN
4.0000 mg | Freq: Three times a day (TID) | INTRAMUSCULAR | Status: DC | PRN
  Administered 2021-01-05 – 2021-01-07 (×4): 4 mg via INTRAVENOUS
  Filled 2021-01-05 (×4): qty 2

## 2021-01-05 MED ORDER — LACTATED RINGERS IV BOLUS
1000.0000 mL | INTRAVENOUS | Status: AC
  Administered 2021-01-05 (×2): 1000 mL via INTRAVENOUS

## 2021-01-05 NOTE — ED Notes (Signed)
xr at bedside

## 2021-01-05 NOTE — ED Notes (Signed)
Patient transported to CT 

## 2021-01-05 NOTE — ED Notes (Signed)
Admitting MD aware of patient request for stronger pain medication, per admitting MD, no new orders for pain medication at this time.

## 2021-01-05 NOTE — ED Notes (Addendum)
Lab called to come and collect rest of labs due to patient being very difficult IV start. This RN attempted multiple times for IV access, sent what blood able to be collected to lab.

## 2021-01-05 NOTE — ED Notes (Signed)
Pt offered toradol for c/o generalized pain, pt states it doesn't work, this Animator admitted MD regarding.

## 2021-01-05 NOTE — ED Notes (Signed)
Repeat blood work collected by this Charity fundraiser. Meal tray brought to patient at this time. Pt requesting strong pain medication than IV toradol, explained to patient would message admitting MD regarding, pt states understanding at this time.

## 2021-01-05 NOTE — ED Notes (Signed)
ED Provider at bedside. 

## 2021-01-05 NOTE — ED Provider Notes (Signed)
[facility] Emergency Department Provider Note  ____________________________________________   Event Date/Time   First MD Initiated Contact with Patient 01/05/21 [phone]     (approximate)  I have reviewed the triage vital signs and the nursing notes.   HISTORY  Chief Complaint Emesis and Abdominal Pain    HPI COSTAS SENA is a 30 y.o. male with history of insulin-dependent diabetes, hypertension, hepatitis C, polysubstance abuse abuse who presents to the emergency department with EMS for complaints of abdominal pain, chest pain, neck pain that he describes as feeling "hot".  States he has had this as well as nausea and vomiting for 3 days.  Has been out of his insulin.  No diarrhea.  States he thinks he has had a fever.  No dysuria, hematuria, discharge.  No shortness of breath, cough.  Does have history of DKA.  Blood glucose in the 400s with EMS.        Past Medical History:  Diagnosis Date   Heart attack (HCC)    Pt claims he had heart attack a year ago ( 2015)- and was admitted in [facility] for that, but not given any meds or angiogram, on review of chart- I could not find any details like that.   Hepatitis C, acute may 2016   Heroin abuse (HCC)    History of noncompliance with medical treatment    Hypertension    Type 1 diabetes [facility])     Patient Active Problem List   Diagnosis Date Noted   SIRS (systemic inflammatory response syndrome) (HCC) 03/03/2020   Cellulitis of left hand 03/03/2020   Elevated blood pressure reading 01/18/2020   Hypokalemia    Sepsis without acute organ dysfunction (HCC)    Dental abscess 09/12/2019   Intractable vomiting    Hematemesis with nausea    Cannabis hyperemesis syndrome concurrent with and due to cannabis abuse (HCC)    Elevated LFTs    Abnormal laboratory test result 07/20/2019   Smoking 07/20/2019   History of hepatitis C 02/25/2019   Health care maintenance 02/25/2019   Peripheral neuropathy 02/25/2019    Hand abscess 10/13/2018   IVDU (intravenous drug user) 10/11/2018   Hyperkalemia 10/11/2018   Cellulitis and abscess of hand 08/17/2018   Polysubstance abuse (HCC) 01/20/2018   Abdominal pain 06/29/2017   Hyponatremia 06/29/2017   DKA (diabetic ketoacidosis) (HCC) 06/29/2017   Dental erosion extending into pulp 12/31/2016   MRSA carrier 07/05/2016   Elevated transaminase level 03/27/2016   Diabetes mellitus type 1 (HCC) 03/27/2016   Tobacco abuse counseling 03/27/2016   Transaminitis 03/25/2016   Leukocytosis 01/13/2016   Cannabinoid hyperemesis syndrome 01/10/2016   Tobacco abuse 01/10/2016   Type 1 diabetes mellitus with hyperglycemia (HCC) 12/12/2015   Cocaine abuse (HCC) 12/12/2015   DKA (diabetic ketoacidoses) 06/10/2015   Hidradenitis suppurativa of left axilla    DKA, type 1 (HCC) 06/06/2015   Malnutrition of moderate degree (HCC) 04/08/2015   Hepatitis C 12/13/2014   Diabetes type 1, uncontrolled (HCC) 12/11/2014   Major depressive disorder, single episode, mild (HCC)    Major depression, single episode 12/10/2014   Diabetes mellitus type 1, uncontrolled (HCC) 12/09/2014   Homelessness 12/09/2014    Past Surgical History:  Procedure Laterality Date   INCISION AND DRAINAGE ABSCESS Left 03/04/2020   Procedure: INCISION AND DRAINAGE ABSCESS;  Surgeon: Christena Flake, MD;  Location: [facility] ORS;  Service: Orthopedics;  Laterality: Left;   NO PAST SURGERIES     none  Prior to Admission medications   Medication Sig Start Date End Date Taking? Authorizing Provider  gabapentin (NEURONTIN) 400 MG capsule TAKE ONE CAPSULE BY MOUTH AT BEDTIME Patient taking differently: Take 400 mg by mouth at bedtime. 12/28/20 12/28/21 Yes Abate, Desta A, NP  insulin glargine (LANTUS) 100 UNIT/ML injection Inject 0.3 mLs (30 Units total) into the skin 2 (two) times daily. 12/28/20  Yes Abate, Desta A, NP  insulin lispro (HUMALOG) 100 UNIT/ML KwikPen INJECT 5-10 UNITS INTO THE SKIN BEFORE MEALS PER  SLIDING SCALE. IF BLOOD SUGAR IS 120-250=5 UNITS;251-350=8 UNITS; OVER 350=10 UNITS 12/28/20 12/28/21 Yes Abate, Desta A, NP  BD VEO INSULIN SYRINGE U/F 31G X 15/64" 0.3 ML MISC AS DIRECTED WITH LANTUS 12/28/20 12/28/21  Abate, Desta A, NP  COMFORT EZ PEN NEEDLES 32G X 4 MM MISC AS DIRECTED WITH HUMALOG [facility] 08/11/20 08/11/21    famotidine (PEPCID) 20 MG tablet Take 20 mg by mouth 2 (two) times daily. Patient not taking: No sig reported    [provider]  glucose blood (FREESTYLE LITE) test strip To be used as directed for checking her blood sugar 12/28/20   Abate, Desta A, NP  senna-docusate (SENOKOT-S) 8.6-50 MG tablet Take 2 tablets by mouth 2 (two) times daily as needed for mild constipation. Patient not taking: No sig reported 03/08/20   Darlin Priestly, MD    Allergies Bee venom, Ibuprofen, Tramadol, and Vancomycin  Family History  Problem Relation Age of Onset   Cirrhosis Mother    Diabetes Mellitus II Maternal Grandmother    Diabetes Mellitus II Maternal Grandfather    CAD Father     Social History Social History   Tobacco Use   Smoking status: Every Day    Packs/day: 2.00    Pack years: 0.00    Types: Cigarettes   Smokeless tobacco: Never  Vaping Use   Vaping Use: Never used  Substance Use Topics   Alcohol use: No    Alcohol/week: 0.0 standard drinks   Drug use: Not Currently    Types: IV, Heroin, Marijuana    Comment: heroin-     Review of Systems Constitutional: Subjective fever. Eyes: No visual changes. ENT: No sore throat. Cardiovascular: + chest pain. Respiratory: Denies shortness of breath. Gastrointestinal: + nausea, vomiting.  No diarrhea. Genitourinary: Negative for dysuria. Musculoskeletal: Negative for back pain. Skin: Negative for rash. Neurological: Negative for focal weakness or numbness.  ____________________________________________   PHYSICAL EXAM:  VITAL SIGNS: ED Triage Vitals  Enc Vitals Group     BP 01/05/21 0447 (!) 150/94     Pulse  Rate 01/05/21 0447 93     Resp 01/05/21 0447 (!) 22     Temp 01/05/21 0447 98.3 F (36.8 C)     Temp Source 01/05/21 0447 Oral     SpO2 01/05/21 0448 100 %     Weight 01/05/21 0445 120 lb (54.4 kg)     Height 01/05/21 0445 5\' 4"  (1.626 m)     Head Circumference --      Peak Flow --      Pain Score 01/05/21 0445 10     Pain Loc --      Pain Edu? --      Excl. in GC? --    CONSTITUTIONAL: Alert and oriented and responds appropriately to questions.  Chronically ill-appearing, thin, appears older than stated age HEAD: Normocephalic EYES: Conjunctivae clear, pupils appear equal, EOM appear intact ENT: normal nose; moist mucous membranes, poor dentition NECK: Supple, normal  ROM CARD: RRR; S1 and S2 appreciated; no murmurs, no clicks, no rubs, no gallops RESP: Normal chest excursion without splinting or tachypnea; breath sounds clear and equal bilaterally; no wheezes, no rhonchi, no rales, no hypoxia or respiratory distress, speaking full sentences ABD/GI: Normal bowel sounds; non-distended; soft, non-tender, no rebound, no guarding, no peritoneal signs, no hepatosplenomegaly BACK: The back appears normal EXT: Normal ROM in all joints; no deformity noted, no edema; no cyanosis SKIN: Normal color for age and race; warm; no rash on exposed skin NEURO: Moves all extremities equally PSYCH: The patient's mood and manner are appropriate.  ____________________________________________   LABS (all labs ordered are listed, but only abnormal results are displayed)  Labs Reviewed  BASIC METABOLIC PANEL - Abnormal; Notable for the following components:      Result Value   Sodium 129 (*)    Potassium 5.3 (*)    Chloride 90 (*)    Glucose, Bld 491 (*)    BUN 35 (*)    Anion gap 17 (*)    All other components within normal limits  BETA-HYDROXYBUTYRIC ACID - Abnormal; Notable for the following components:   Beta-Hydroxybutyric Acid 2.94 (*)    All other components within normal limits  CBC  WITH DIFFERENTIAL/PLATELET - Abnormal; Notable for the following components:   WBC 14.2 (*)    Neutro Abs 10.6 (*)    All other components within normal limits  URINALYSIS, COMPLETE (UACMP) WITH MICROSCOPIC - Abnormal; Notable for the following components:   Color, Urine STRAW (*)    APPearance CLEAR (*)    Specific Gravity, Urine 1.031 (*)    Glucose, UA >=500 (*)    Ketones, ur 80 (*)    All other components within normal limits  HEPATIC FUNCTION PANEL - Abnormal; Notable for the following components:   Total Protein 8.8 (*)    AST 79 (*)    ALT 76 (*)    Total Bilirubin 1.5 (*)    Bilirubin, Direct 0.3 (*)    Indirect Bilirubin 1.2 (*)    All other components within normal limits  URINE DRUG SCREEN, QUALITATIVE ([facility] ONLY) - Abnormal; Notable for the following components:   Amphetamines, Ur Screen POSITIVE (*)    Cocaine Metabolite,Ur [place] POSITIVE (*)    Cannabinoid 50 Ng, Ur [place] POSITIVE (*)    All other components within normal limits  CBG MONITORING, ED - Abnormal; Notable for the following components:   Glucose-Capillary 462 (*)    All other components within normal limits  RESP PANEL BY RT-PCR (FLU A&B, COVID) ARPGX2  CULTURE, BLOOD (ROUTINE X 2)  CULTURE, BLOOD (ROUTINE X 2)  URINE CULTURE  BLOOD GAS, VENOUS  LIPASE, BLOOD  ETHANOL  LACTIC ACID, PLASMA  LACTIC ACID, PLASMA  PROCALCITONIN  CBG MONITORING, ED  TROPONIN I (HIGH SENSITIVITY)  TROPONIN I (HIGH SENSITIVITY)   ____________________________________________  EKG   EKG Interpretation  Date/Time:  Friday January 05 2021 04:50:24 EDT Ventricular Rate:  71 PR Interval:  118 QRS Duration: 94 QT Interval:  372 QTC Calculation: 405 R Axis:   91 Text Interpretation: Sinus rhythm Borderline short PR interval Borderline right axis deviation Nonspecific T abnormalities, anterior leads Confirmed by Rochele RaringWard, Delva Derden [phone]([id number]) on 01/05/2021 4:52:27 AM          ____________________________________________  RADIOLOGY Normajean BaxterI, Adarsh Mundorf, personally viewed and evaluated these images (plain radiographs) as part of my medical decision making, as well as reviewing the written report by the radiologist.  ED MD interpretation:  Chest x-ray clear.  CT of the abdomen pelvis shows possible gastritis, cirrhosis, malnutrition.  Official radiology report(s): CT ABDOMEN PELVIS W CONTRAST  Result Date: 01/05/2021 CLINICAL DATA:  Vomiting abdominal pain, diabetic, out of insulin EXAM: CT ABDOMEN AND PELVIS WITH CONTRAST TECHNIQUE: Multidetector CT imaging of the abdomen and pelvis was performed using the standard protocol following bolus administration of intravenous contrast. CONTRAST:  OMNIPAQUE IOHEXOL 300 MG/ML  SOLN COMPARISON:  CT 03/01/2019 FINDINGS: Lower chest: No acute abnormality in the upper chest or imaged lung apices. Hepatobiliary: Hepatic attenuation is diffusely diminished with some mild heterogeneity and slight peripheral sparing. More focal hypoattenuation towards the falciform ligament could reflect further focal fatty infiltration. Gallbladder appears decompressed at time of exam. No visible calcified gallstones or biliary ductal dilatation. Pancreas: No pancreatic ductal dilatation or surrounding inflammatory changes. Spleen: Normal in size. No concerning splenic lesions. Adrenals/Urinary Tract: Normal adrenal glands. Kidneys are normally located with symmetric enhancement. No suspicious renal lesion, urolithiasis or hydronephrosis. Bladder partially decompressed at time of exam. Mild wall thickening may be related to underdistention. Stomach/Bowel: Stopping assessment of the bowel given a marked paucity of intraperitoneal fat. There is some questionable thickening of the distal thoracic esophagus and gastric rugal folds, the latter finding possibly accentuated by underdistention. Duodenum is unremarkable. Much of the bowel is decompressed. No focal  dilatation or more conspicuous wall thickening is seen. Appendix is not visualized. Moderate to large colonic stool burden. No colonic dilatation or wall thickening. Vascular/Lymphatic: No significant vascular findings are present. Evaluation for adenopathy is significantly limited given the paucity of intraperitoneal fat. No visible pathologically enlarged nodes. Reproductive: The prostate and seminal vesicles are unremarkable. Other: Since the 2020 comparison examination there has been significant decrease in the amount of subcutaneous and intraperitoneal fat. No discernible free pelvic air or fluid. Musculoskeletal: No acute or significant osseous findings. IMPRESSION: 1. Some questionable thickening of the distal thoracic esophagus and gastric rugal folds. Could reflect an esophagogastritis in the appropriate clinical setting. Consider outpatient direct visualization. 2. Bladder is partially decompressed at time of exam. Mild wall thickening is nonspecific. Correlate with pending urinalysis. 3. Heterogeneously diminished attenuation of the liver, compatible with intrinsic liver disease and a history of hepatitis C. There is stable focal fatty infiltration along the falciform ligament as well. 4. Diffuse paucity of the subcutaneous and intraperitoneal fat the latter of which complicates assessment of the bowel and abdominal viscera. Correlate with patient's nutritional status. These results were called by telephone at the time of interpretation on 01/05/2021 at 6:20 am to provider [facility] , who verbally acknowledged these results. Electronically Signed   By: Kreg Shropshire M.D.   On: 01/05/2021 06:20   DG Chest Portable 1 View  Result Date: 01/05/2021 CLINICAL DATA:  Vomiting, abdominal pain EXAM: PORTABLE CHEST 1 VIEW COMPARISON:  03/03/2020 FINDINGS: The lungs are clear without focal consolidation. No convincing radiographic features of edema with normal distribution of the pulmonary vascularity. No  visible pneumothorax or effusion the portion of the left costophrenic sulcus is collimated. Telemetry leads overlie the chest. No acute osseous or soft tissue abnormality. IMPRESSION: No acute cardiopulmonary abnormality. Electronically Signed   By: Kreg Shropshire M.D.   On: 01/05/2021 05:26    ____________________________________________   PROCEDURES  Procedure(s) performed (including Critical Care):  Procedures  CRITICAL CARE Performed by: Baxter Hire Duchess Armendarez   Total critical care time: 65 minutes  Critical care time was exclusive of separately billable procedures and treating other patients.  Critical care  was necessary to treat or prevent imminent or life-threatening deterioration.  Critical care was time spent personally by me on the following activities: development of treatment plan with patient and/or surrogate as well as nursing, discussions with consultants, evaluation of patient's response to treatment, examination of patient, obtaining history from patient or surrogate, ordering and performing treatments and interventions, ordering and review of laboratory studies, ordering and review of radiographic studies, pulse oximetry and re-evaluation of patient's condition.  ____________________________________________   INITIAL IMPRESSION / ASSESSMENT AND PLAN / ED COURSE  As part of my medical decision making, I reviewed the following data within the electronic MEDICAL RECORD NUMBER Nursing notes reviewed and incorporated, Labs reviewed , EKG interpreted , Old EKG reviewed, Old chart reviewed, Radiograph reviewed , Discussed with admitting physician , and Notes from prior ED visits         Patient here with subjective fevers, chest pain, abdominal pain, vomiting.  Hyperglycemic with EMS.  I suspect that he is in DKA.  Will obtain labs, urine and start IV fluids.  EKG nonischemic.  Chest pain seems atypical in nature.  Doubt ACS, PE, dissection.  Abdominal exam benign.  Doubt appendicitis,  colitis, diverticulitis, bowel obstruction.  Will give Zofran for symptomatic relief.  ED PROGRESS  Patient's VBG shows normal pH, normal bicarb.  Anion gap slightly elevated.  Large ketones in his urine.  Elevated beta hydroxybutyric acid level.  Could be early onset DKA.  Getting IV fluids.  Will start insulin infusion.  I am also concerned for possible sepsis as he has a leukocytosis with left shift and is tachycardic, tachypneic.  Rectal temperature here of 100.1.  Discussed with pharmacist who recommends linezolid, cefepime for broad coverage given patient has an allergy to vancomycin.  He has been admitted before for sepsis from cellulitis.  No signs of cellulitis on exam.  He does have an area that looks like a herpetic lesion versus impetigo to the left side of his mouth.  He is high risk for bacteremia as well as endocarditis given his IV drug abuse.  Blood cultures pending.  Drug screen today is positive for cannabinoids, amphetamines, cocaine.  CT of the abdomen pelvis shows possible gastritis, cystitis.  He has cirrhosis and signs of malnutrition.  Urine does not appear infected today but urine culture pending.  Will discuss with hospitalist for admission.   7:13 AM Discussed patient's case with hospitalist, Dr. Clyde Lundborg.  I have recommended admission and patient (and family if present) agree with this plan. Admitting physician will place admission orders.   I reviewed all nursing notes, vitals, pertinent previous records and reviewed/interpreted all EKGs, lab and urine results, imaging (as available).  ____________________________________________   FINAL CLINICAL IMPRESSION(S) / ED DIAGNOSES  Final diagnoses:  Nausea and vomiting in adult  Atypical chest pain  SIRS (systemic inflammatory response syndrome) (HCC)  Hyperglycemia  Polysubstance abuse [facility])     ED Discharge Orders     None       *Please note:  Trevor Brown was evaluated in Emergency Department on 01/05/2021 for  the symptoms described in the history of present illness. He was evaluated in the context of the global COVID-19 pandemic, which necessitated consideration that the patient might be at risk for infection with the SARS-CoV-2 virus that causes COVID-19. Institutional protocols and algorithms that pertain to the evaluation of patients at risk for COVID-19 are in a state of rapid change based on information released by regulatory bodies including the CDC and  federal and state organizations. These policies and algorithms were followed during the patient's care in the ED.  Some ED evaluations and interventions may be delayed as a result of limited staffing during and the pandemic.*   Note:  This document was prepared using Dragon voice recognition software and may include unintentional dictation errors.    Jordynn Perrier, Layla Maw, DO 01/05/21 [phone]

## 2021-01-05 NOTE — ED Notes (Signed)
Pt continues to rest in bed with eyes closed, VSS. Insulin drip adjusted per endotool at this time.

## 2021-01-05 NOTE — Progress Notes (Signed)
Patient complains of pain to abdomen and leg. lidocaine patch, toradol and neurotin given per orders.  Patient states did drugs and eels like he is going into withdrawals, ativan previously given when patient arrived to unit. MD made aware.

## 2021-01-05 NOTE — ED Notes (Signed)
Pt refused rectal temp at this time 

## 2021-01-05 NOTE — ED Triage Notes (Signed)
Pt presents to the ER with complaints of vomiting and abdominal pain. Diabetic, out of insulin for a few days.

## 2021-01-05 NOTE — ED Notes (Signed)
Medication administered per order. Pt tolerated well. Pt awakens easily, asks for pain medication then back to sleep. VSS at this time.

## 2021-01-05 NOTE — Consult Note (Addendum)
Pharmacy Antibiotic Note  Trevor Brown is a 30 y.o. male admitted on 01/05/2021 with sepsis complaining of abdominal, chest and neck pain that has been occurring for the last 3 days. Patient also has a history of polysubstance abuse, IV drug abuse. Pharmacy has been consulted for Cefepime and vancomycin dosing.   -received Linezolid IV x 1 dose in ED -hx Red Man syndrome with Vancomycin-will infuse over longer duration  Plan: -Cefepime 2g Q 8 hrs   for Crcl 81.4 ml/min  -Loading dose Vancomycin 1250 mg IV x 1 (give 12 hrs after Linezolid dose) and then continue with Vancomycin 750 mg IV Q 12 hrs. Goal AUC 400-550. Expected AUC: 435 Cmin 11.1 Weight 54.4 kg  (used TBW in calc as IBW= 59 kg) SCr used: 0.84  F/u renal fxn and cultures    Height: 5\' 4"  (162.6 cm) Weight: 54.4 kg (120 lb) IBW/kg (Calculated) : 59.2  Temp (24hrs), Avg:99.2 F (37.3 C), Min:98.3 F (36.8 C), Max:100.1 F (37.8 C)  Recent Labs  Lab 01/05/21 0459 01/05/21 0732 01/05/21 0800 01/05/21 1005  WBC 14.2*  --   --   --   CREATININE 1.13  --  1.04  --   LATICACIDVEN  --  3.3*  --  2.2*     Estimated Creatinine Clearance: 81.4 mL/min (by C-G formula based on SCr of 1.04 mg/dL).    Allergies  Allergen Reactions   Bee Venom Anaphylaxis   Ibuprofen Itching, Rash and Other (See Comments)    Other reaction(s): Other (See Comments) Stomach upset Reaction:  GI upset    Tramadol Hives and Swelling   Vancomycin Rash and Other (See Comments)    Reaction:  Red man's syndrome     Antimicrobials this admission: Linezolid 6/17  x 1 dose Vanc 6/17 >> Cefepime 6/17 >>     Microbiology results: 6/17 BCx: pending 6/17 UCx: pending   Thank you for allowing pharmacy to be a part of this patient's care.  Montray Kliebert A 01/05/2021 12:07 PM

## 2021-01-05 NOTE — ED Notes (Signed)
Lights dimmed for patient comfort. Pt back to sleep at this time. Pt requesting something for pain prior to going back to sleep.

## 2021-01-05 NOTE — Progress Notes (Signed)
CODE SEPSIS - PHARMACY COMMUNICATION  **Broad Spectrum Antibiotics should be administered within 1 hour of Sepsis diagnosis**  Time Code Sepsis Called/Page Received: 3335  Antibiotics Ordered: Cefepime and Linezolid   Time of 1st antibiotic administration: 4562  Otelia Sergeant, PharmD, [facility] 01/05/2021 7:15 AM

## 2021-01-05 NOTE — Consult Note (Signed)
Pharmacy Antibiotic Note  Trevor Brown is a 30 y.o. male admitted on 01/05/2021 with sepsis complaining of abdominal, chest and neck pain that has been occurring for the last 3 days. Patien also has a history of polysubstance abuse. Pharmacy has been consulted for Cefepime dosing. Patient is also currently on Linezolid.  Plan: Cefepime 2g Q 8 hrs  Height: 5\' 4"  (162.6 cm) Weight: 54.4 kg (120 lb) IBW/kg (Calculated) : 59.2  Temp (24hrs), Avg:99.2 F (37.3 C), Min:98.3 F (36.8 C), Max:100.1 F (37.8 C)  Recent Labs  Lab 01/05/21 0459  WBC 14.2*  CREATININE 1.13    Estimated Creatinine Clearance: 74.9 mL/min (by C-G formula based on SCr of 1.13 mg/dL).    Allergies  Allergen Reactions   Bee Venom Anaphylaxis   Ibuprofen Itching, Rash and Other (See Comments)    Other reaction(s): Other (See Comments) Stomach upset Reaction:  GI upset    Tramadol Hives and Swelling   Vancomycin Rash and Other (See Comments)    Reaction:  Red man's syndrome     Antimicrobials this admission: Cefepime 6/17 >>  Linezolid 6/17 >>    Microbiology results: 6/17 BCx: pending 6/17 UCx: pending   Thank you for allowing pharmacy to be a part of this patient's care.  7/17 01/05/2021 7:34 AM

## 2021-01-05 NOTE — ED Notes (Signed)
RN aware of bed assignment 

## 2021-01-05 NOTE — Sepsis Progress Note (Signed)
Monitoring for code sepsis protocol. 

## 2021-01-05 NOTE — H&P (Addendum)
History and Physical    Trevor Brown ZOX:[id number] DOB: [birth year] DOA: 01/05/2021  Referring MD/NP/PA:   PCP: Rolm Gala, NP   Patient coming from:  The patient is coming from home.  At baseline, pt is independent for most of ADL.        Chief Complaint: Nausea, vomiting, abdominal pain, chest pain, fever  HPI: Trevor Brown is a 30 y.o. male with medical history significant of poorly controlled type 1 diabetes, hypertension, GERD, depression, polysubstance abuse (heroine, cocaine, marijuana), IV drug user, HCV, DKA, tobacco abuse, cellulitis and abscess, self-reported CAD, who presents with nausea, vomiting, abdominal pain, chest pain and fever.  Patient states he has been out of his insulin for several days.  He developed fever, chills, nausea, vomiting, abdominal pain in the past three days.  No diarrhea.  Abdomen is located in the lower abdomen, constant, sharp, moderate, nonradiating.  Patient states that he has vomited at least 5 times with nonbilious nonbloody vomiting. His temperature is 100.1 in ED. denies symptoms of UTI.  Patient states he had mild central chest pain earlier, which has resolved.  Currently patient denies active chest pain, shortness of breath, cough.  He states that he is living with his brother.  ED Course: pt was found to have blood sugar 491 bpm, bicarbonate of 22, anion gap 17, urinalysis negative for UTI, but positive for ketone 80, elevated beta hydroxybutyric acid 2.94, negative COVID-19, WBC 14.2, troponin level 4, positive UDS for amphetamine, cocaine and cannabinoid, alcohol level less than 10, abnormal liver function (ALP 118, AST 79, ALT 76, total bilirubin 1.5), pseudohyponatremia, GFR> 60, temperature 100.1, blood pressure 130/65, tachycardia with heart rate 122, tachypnea with RR 22, oxygen saturation 97-100% on room air, chest x-ray negative.  VBG with pH 7.35, CO2 48.  Patient is admitted to stepdown as inpatient for obs.   CT  abdomen/pelvis: 1. Some questionable thickening of the distal thoracic esophagus and gastric rugal folds. Could reflect an esophagogastritis in the appropriate clinical setting. Consider outpatient direct visualization. 2. Bladder is partially decompressed at time of exam. Mild wall thickening is nonspecific. Correlate with pending urinalysis. 3. Heterogeneously diminished attenuation of the liver, compatible with intrinsic liver disease and a history of hepatitis C. There is stable focal fatty infiltration along the falciform ligament as well. 4. Diffuse paucity of the subcutaneous and intraperitoneal fat the latter of which complicates assessment of the bowel and abdominal viscera. Correlate with patient's nutritional status.     Review of Systems:   General: has fevers, chills, no body weight gain, has poor appetite, has fatigue HEENT: no blurry vision, hearing changes or sore throat Respiratory: no dyspnea, coughing, wheezing CV: has chest pain, no palpitations GI: has nausea, vomiting, abdominal pain, no diarrhea, constipation GU: no dysuria, burning on urination, increased urinary frequency, hematuria  Ext: no leg edema Neuro: no unilateral weakness, numbness, or tingling, no vision change or hearing loss Skin: no rash, no skin tear. MSK: No muscle spasm, no deformity, no limitation of range of movement in spin Heme: No easy bruising.  Travel history: No recent long distant travel.  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 for that, but not given any meds or angiogram,  on review of chart- I could not find any details like that.   Hepatitis C, acute may 2016   Heroin abuse (HCC)    History of noncompliance with medical treatment    Hypertension    Type 1 diabetes [facility])     Past Surgical History:  Procedure Laterality Date   INCISION AND DRAINAGE ABSCESS Left 03/04/2020   Procedure: INCISION AND DRAINAGE ABSCESS;  Surgeon: Christena Flake, MD;  Location: [facility] ORS;  Service: Orthopedics;  Laterality: Left;   NO PAST SURGERIES     none      Social History:  reports that he has been smoking cigarettes. He has been smoking an average of 2.00 packs per day. He has never used smokeless tobacco. He reports previous drug use. Drugs: IV, Heroin, and Marijuana. He reports that he does not drink alcohol.  Family History:  Family History  Problem Relation Age of Onset   Cirrhosis Mother    Diabetes Mellitus II Maternal Grandmother    Diabetes Mellitus II Maternal Grandfather    CAD Father      Prior to Admission medications   Medication Sig Start Date End Date Taking? Authorizing Provider  gabapentin (NEURONTIN) 400 MG capsule TAKE ONE CAPSULE BY MOUTH AT BEDTIME Patient taking differently: Take 400 mg by mouth at bedtime. 12/28/20 12/28/21 Yes Abate, Desta A, NP  insulin glargine (LANTUS) 100 UNIT/ML injection Inject 0.3 mLs (30 Units total) into the skin 2 (two) times daily. 12/28/20  Yes Abate, Desta A, NP  insulin lispro (HUMALOG) 100 UNIT/ML KwikPen INJECT 5-10 UNITS INTO THE SKIN BEFORE MEALS PER SLIDING SCALE. IF BLOOD SUGAR IS 120-250=5 UNITS;251-350=8 UNITS; OVER 350=10 UNITS 12/28/20 12/28/21 Yes Abate, Desta A, NP  BD VEO INSULIN SYRINGE U/F 31G X 15/64" 0.3 ML MISC AS DIRECTED WITH LANTUS 12/28/20 12/28/21  Abate, Desta A, NP  COMFORT EZ PEN NEEDLES 32G X 4 MM MISC AS DIRECTED WITH HUMALOG [facility] 08/11/20 08/11/21    famotidine (PEPCID) 20 MG tablet Take 20 mg by mouth 2 (two) times daily. Patient not taking: No sig reported    [provider]  glucose  blood (FREESTYLE LITE) test strip To be used as directed for checking her blood sugar 12/28/20   Abate, Desta A, NP  senna-docusate (SENOKOT-S) 8.6-50 MG tablet Take 2 tablets by mouth 2 (two) times daily as needed for mild constipation. Patient not taking: No sig reported 03/08/20   Darlin Priestly, MD    Physical Exam: Vitals:   01/05/21 0548 01/05/21 0617 01/05/21 0624 01/05/21 0702  BP:   139/81 130/65  Pulse: (!) 109  71 88  Resp: (!) 22  (!) 21 18  Temp:  100.1 F (37.8 C)    TempSrc:  Rectal    SpO2: 97%  100% 100%  Weight:      Height:       General: Not in acute distress HEENT:       Eyes: PERRL, EOMI, no scleral icterus.       ENT: No discharge from the ears and nose, no pharynx injection, no tonsillar enlargement.        Neck: No JVD, no bruit, no mass felt. Heme: No neck lymph node enlargement. Cardiac: S1/S2, RRR, No murmurs, No gallops or rubs.  Respiratory: No rales, wheezing, rhonchi or rubs. GI: Soft, nondistended, has mild tenderness in lower abdomen, no rebound pain, no organomegaly, BS present. GU: No hematuria Ext: No pitting leg edema bilaterally. 1+DP/PT pulse bilaterally. Musculoskeletal: No joint deformities, No joint redness or warmth, no limitation of ROM in spin. Skin: No rashes. Has scabbed cold sore in the right mouth corner, no blister. Neuro: Alert, oriented X3, cranial nerves II-XII grossly intact, moves all extremities normally.  Psych: Patient is not psychotic, no suicidal or hemocidal ideation.  Labs on Admission: I have personally reviewed following labs and imaging studies  CBC: Recent Labs  Lab 01/05/21 0459  WBC 14.2*  NEUTROABS 10.6*  HGB 15.6  HCT 46.6  MCV 85.0  PLT 240   Basic Metabolic Panel: Recent Labs  Lab 01/05/21 0459  NA 129*  K 5.3*  CL 90*  CO2 22  GLUCOSE 491*  BUN 35*  CREATININE 1.13  CALCIUM 10.1   GFR: Estimated Creatinine Clearance: 74.9 mL/min (by C-G formula based on SCr of 1.13 mg/dL). Liver Function  Tests: Recent Labs  Lab 01/05/21 0459  AST 79*  ALT 76*  ALKPHOS 118  BILITOT 1.5*  PROT 8.8*  ALBUMIN 5.0   Recent Labs  Lab 01/05/21 0459  LIPASE 23   No results for input(s): AMMONIA in the last 168 hours. Coagulation Profile: No results for input(s): INR, PROTIME in the last 168 hours. Cardiac Enzymes: No results for input(s): CKTOTAL, CKMB, CKMBINDEX, TROPONINI in the last 168 hours. BNP (last 3 results) No results for input(s): PROBNP in the last 8760 hours. HbA1C: No results for input(s): HGBA1C in the last 72 hours. CBG: Recent Labs  Lab 01/05/21 0446  GLUCAP 462*   Lipid Profile: No results for input(s): CHOL, HDL, LDLCALC, TRIG, CHOLHDL, LDLDIRECT in the last 72 hours. Thyroid Function Tests: No results for input(s): TSH, T4TOTAL, FREET4, T3FREE, THYROIDAB in the last 72 hours. Anemia Panel: No results for input(s): VITAMINB12, FOLATE, FERRITIN, TIBC, IRON, RETICCTPCT in the last 72 hours. Urine analysis:    Component Value Date/Time   COLORURINE STRAW (A) 01/05/2021 0459   APPEARANCEUR CLEAR (A) 01/05/2021 0459   APPEARANCEUR Clear 07/08/2019 1825   LABSPEC 1.031 (H) 01/05/2021 0459   LABSPEC 1.026 04/06/2014 0751   PHURINE 5.0 01/05/2021 0459   GLUCOSEU >=500 (A) 01/05/2021 0459   GLUCOSEU >=500 04/06/2014 0751   HGBUR NEGATIVE 01/05/2021 0459   BILIRUBINUR NEGATIVE 01/05/2021 0459   BILIRUBINUR Negative 07/08/2019 1825   BILIRUBINUR Negative 04/06/2014 0751   KETONESUR 80 (A) 01/05/2021 0459   PROTEINUR NEGATIVE 01/05/2021 0459   NITRITE NEGATIVE 01/05/2021 0459   LEUKOCYTESUR NEGATIVE 01/05/2021 0459   LEUKOCYTESUR Negative 04/06/2014 0751   Sepsis Labs: @LABRCNTIP (procalcitonin:4,lacticidven:4) ) Recent Results (from the past 240 hour(s))  Resp Panel by RT-PCR (Flu A&B, Covid) Nasopharyngeal Swab     Status: None   Collection Time: 01/05/21  5:12 AM   Specimen: Nasopharyngeal Swab; Nasopharyngeal(NP) swabs in vial transport medium   Result Value Ref Range Status   SARS Coronavirus 2 by RT PCR NEGATIVE NEGATIVE Final    Comment: (NOTE) SARS-CoV-2 target nucleic acids are NOT DETECTED.  The SARS-CoV-2 RNA is generally detectable in upper respiratory specimens during the acute phase of infection. The lowest concentration of SARS-CoV-2 viral copies this assay can detect is 138 copies/mL. A negative result does not preclude SARS-Cov-2 infection and should not be used as the sole basis for treatment or other patient management decisions. A negative result may occur with  improper specimen collection/handling, submission of specimen other than nasopharyngeal swab, presence of viral mutation(s) within the areas targeted by this assay, and inadequate number of viral copies(<138 copies/mL). A negative result must be combined with clinical observations, patient history, and epidemiological information. The expected result is Negative.  Fact Sheet for Patients:  BloggerCourse.com  Fact Sheet for Healthcare Providers:  SeriousBroker.it  This test is no t yet approved or cleared by the [place] FDA and  has been authorized for detection and/or diagnosis of SARS-CoV-2 by FDA under an Emergency Use Authorization (EUA). This EUA will remain  in effect (meaning this test can be used) for the duration of the COVID-19 declaration under Section 564(b)(1) of the Act, 21 U.S.C.section 360bbb-3(b)(1), unless the authorization is terminated  or revoked sooner.       Influenza A by PCR NEGATIVE NEGATIVE Final   Influenza B by PCR NEGATIVE NEGATIVE Final    Comment: (NOTE) The Xpert Xpress SARS-CoV-2/FLU/RSV plus assay is intended as an aid in the diagnosis of influenza from Nasopharyngeal swab specimens and should not be used as a sole basis for treatment. Nasal washings and aspirates are unacceptable for Xpert Xpress SARS-CoV-2/FLU/RSV testing.  Fact Sheet for  Patients: BloggerCourse.com  Fact Sheet for Healthcare Providers: SeriousBroker.it  This test is not yet approved or cleared by the [place] FDA and has been authorized for detection and/or diagnosis of SARS-CoV-2 by FDA under an Emergency Use Authorization (EUA). This EUA will remain in effect (meaning this test can be used) for the duration of the COVID-19 declaration under Section 564(b)(1) of the Act, 21 U.S.C. section 360bbb-3(b)(1), unless the authorization is terminated or revoked.  Performed at [facility], [address] [place], [place] [address]      Radiological Exams on Admission: CT ABDOMEN PELVIS W CONTRAST  Result Date: 01/05/2021 CLINICAL DATA:  Vomiting abdominal pain, diabetic, out of insulin EXAM: CT ABDOMEN AND PELVIS WITH CONTRAST TECHNIQUE: Multidetector CT imaging of the abdomen and pelvis was performed using the standard protocol following bolus administration of intravenous contrast. CONTRAST:  OMNIPAQUE IOHEXOL 300 MG/ML  SOLN COMPARISON:  CT 03/01/2019 FINDINGS: Lower chest: No acute abnormality in the upper chest or imaged lung apices. Hepatobiliary: Hepatic attenuation is diffusely diminished with some mild heterogeneity and slight peripheral sparing. More focal hypoattenuation towards the falciform ligament could reflect further focal fatty infiltration. Gallbladder appears decompressed at time of exam. No visible calcified gallstones or biliary ductal dilatation. Pancreas: No pancreatic ductal dilatation or surrounding inflammatory changes. Spleen: Normal in size. No concerning splenic lesions. Adrenals/Urinary Tract: Normal adrenal glands. Kidneys are normally located with symmetric enhancement. No suspicious renal lesion, urolithiasis or hydronephrosis. Bladder partially decompressed at time of exam. Mild wall thickening may be related to underdistention. Stomach/Bowel: Stopping assessment  of the bowel given a marked paucity of intraperitoneal fat. There is some questionable thickening of the distal thoracic esophagus and gastric rugal folds, the latter finding possibly accentuated by underdistention. Duodenum is unremarkable. Much of the bowel is decompressed. No focal dilatation or more conspicuous wall thickening is seen. Appendix is not visualized. Moderate to large colonic stool burden. No colonic dilatation or wall thickening. Vascular/Lymphatic: No significant vascular findings are present. Evaluation for adenopathy is significantly limited given the paucity of intraperitoneal fat. No visible pathologically enlarged nodes. Reproductive: The prostate and seminal vesicles are unremarkable. Other: Since the 2020 comparison examination there has been significant decrease in the amount of subcutaneous and intraperitoneal fat. No discernible free pelvic air  or fluid. Musculoskeletal: No acute or significant osseous findings. IMPRESSION: 1. Some questionable thickening of the distal thoracic esophagus and gastric rugal folds. Could reflect an esophagogastritis in the appropriate clinical setting. Consider outpatient direct visualization. 2. Bladder is partially decompressed at time of exam. Mild wall thickening is nonspecific. Correlate with pending urinalysis. 3. Heterogeneously diminished attenuation of the liver, compatible with intrinsic liver disease and a history of hepatitis C. There is stable focal fatty infiltration along the falciform ligament as well. 4. Diffuse paucity of the subcutaneous and intraperitoneal fat the latter of which complicates assessment of the bowel and abdominal viscera. Correlate with patient's nutritional status. These results were called by telephone at the time of interpretation on 01/05/2021 at 6:20 am to provider [facility] , who verbally acknowledged these results. Electronically Signed   By: Kreg Shropshire M.D.   On: 01/05/2021 06:20   DG Chest Portable 1  View  Result Date: 01/05/2021 CLINICAL DATA:  Vomiting, abdominal pain EXAM: PORTABLE CHEST 1 VIEW COMPARISON:  03/03/2020 FINDINGS: The lungs are clear without focal consolidation. No convincing radiographic features of edema with normal distribution of the pulmonary vascularity. No visible pneumothorax or effusion the portion of the left costophrenic sulcus is collimated. Telemetry leads overlie the chest. No acute osseous or soft tissue abnormality. IMPRESSION: No acute cardiopulmonary abnormality. Electronically Signed   By: Kreg Shropshire M.D.   On: 01/05/2021 05:26     EKG: I have personally reviewed.  Sinus rhythm, QTC 405, borderline left atrial enlargement, low voltage, minimal T wave inversion in V1-V3   Assessment/Plan Principal Problem:   DKA (diabetic ketoacidosis) (HCC) Active Problems:   Diabetes mellitus type 1, uncontrolled (HCC)   Chest pain   Tobacco abuse   Elevated transaminase level   Abdominal pain   Hyperkalemia   Polysubstance abuse (HCC)   SIRS (systemic inflammatory response syndrome) (HCC)   HTN (hypertension)   GERD (gastroesophageal reflux disease)   DKA (diabetic ketoacidosis) (HCC): He has been out of insulin for several days. Pt seems to have developed early stage of DKA. He has blood sugar 491 bpm, anion gap 17, urinalysis negative positive for ketone 80 and elevated beta hydroxybutyric acid 2.94.   - Admit to stepdown  - 2L of LR bolus - start DKA protocol with BMP q4h - IVF: LR at 125 cc/h, will switch to D5-LR at 125 cc/h when CBG<250 - replete K as needed - Zofran prn nausea  - NPO  - consult to diabetic educator  Addendum: the repeated BMP showed closed anion gap at 14 --will start Lantus 20 units twice daily (patient is taking 30 unit twice daily at home) -Stop insulin drip at 2 hours after starting Lantus -Sliding scale insulin -change bed to med-surg  Diabetes mellitus type 1, uncontrolled (HCC): Recent A1c 10.7, poorly controlled.   Patient is taking Humalog and Lantus, but ran out of insulin for several days per patient report. Now has DKA. -on DKA protocol now  SIRS (systemic inflammatory response syndrome): Patient meets criteria for SIRS with WBC 14.2, tachycardia with heart rate 122, tachypnea with RR 22.  Pending lactic acid level.  No source of infection identified at this moment, therefore will treat patient as SIRS now. If source of infection identified later, will treat as sepsis.  -will get Procalcitonin and trend lactic acid levels -IVF: 2L of LR bolus in ED, followed by 125 cc/h  -Linezolid was started in ED. Per pharm, pt and red man syndrome and tolerated vanco  in the past, will switch to vanco per pharm -cefepime -Follow-up of blood culture and urine culture  Chest pain: Patient self reported history of CAD.  He had chest pain earlier, which has resolved.  Currently no active chest pain and shortness of breath.  Initial troponin level 4.  May be due to substance abuse.  Patient has positive UDS for amphetamine, cocaine and cannabinoid. -Patient states that he cannot tolerate aspirin (causing nausea vomiting) -Trend troponin -Check FLP -A1c 10.7 on 12/28/2020 -As needed nitroglycerin and ketorolac for pain -Avoid narcotics  Tobacco abuse and polysubstance abuse: -nicotine patch -Did counseling about importance of quitting substance use -prn ketorolac as needed for pain -Lidoderm  -As needed Ativan Increase Neurontin dose from 400 mg daily to twice daily  Elevated transaminase level: this is chronic issue.  Likely due to history of hepatitis C and polysubstance abuse -Judicious use Tylenol as needed only for fever -HIV antibody  Abdominal pain, nausea, vomiting: No diarrhea: Lipase normal 23.  CT abdomen/pelvis showed possible esophagogastritis.  DKA may have contributed partially. -As needed Zofran -IV fluid -esophagogastritis  Hyperkalemia: Potassium 5.3, due to DKA -Expecting correction with  insulin drip  HTN (hypertension): Blood pressure 130/85.  Patient not taking medications currently -IV hydralazine as needed  GERD (gastroesophageal reflux disease) -Pepcid    DVT ppx: SQ Lovenox Code Status: Full code Family Communication: not done, no family member is at bed side.        Disposition Plan:  Anticipate discharge back to previous environment Consults called: None Admission status and Level of care: Stepdown:     as inpt       Status is: Inpatient  Remains inpatient appropriate because:Inpatient level of care appropriate due to severity of illness  Dispo: The patient is from: Home              Anticipated d/c is to: Home              Patient currently is not medically stable to d/c.   Difficult to place patient No            Date of Service 01/05/2021    Lorretta HarpXilin Braycen Burandt [facility]   If 7PM-7AM, please contact night-coverage [email or web address] 01/05/2021, 8:02 AM

## 2021-01-05 NOTE — ED Notes (Signed)
This RN to bedside, explained to patient that no new orders for pain medications from admitting MD, pt in agreement for Toradol at this time.

## 2021-01-05 NOTE — ED Notes (Signed)
Lab at bedside to recollect labs.  

## 2021-01-05 NOTE — ED Notes (Addendum)
Attempted multiple sticks to obtain blood, unsuccessful. Proceeding with antibiotics per order.

## 2021-01-05 NOTE — Progress Notes (Signed)
Inpatient Diabetes Program Recommendations  AACE/ADA: New Consensus Statement on Inpatient Glycemic Control (2015)  Target Ranges:  Prepandial:   less than 140 mg/dL      Peak postprandial:   less than 180 mg/dL (1-2 hours)      Critically ill patients:  140 - 180 mg/dL   Results for LERON, STOFFERS (MRN [id number]) as of 01/05/2021 07:31  Ref. Range 01/05/2021 04:59  Sodium Latest Ref Range: 135 - 145 mmol/L 129 (L)  Potassium Latest Ref Range: 3.5 - 5.1 mmol/L 5.3 (H)  Chloride Latest Ref Range: 98 - 111 mmol/L 90 (L)  CO2 Latest Ref Range: 22 - 32 mmol/L 22  Glucose Latest Ref Range: 70 - 99 mg/dL 710 (H)  BUN Latest Ref Range: 6 - 20 mg/dL 35 (H)  Creatinine Latest Ref Range: 0.61 - 1.24 mg/dL 6.26  Calcium Latest Ref Range: 8.9 - 10.3 mg/dL 94.8  Anion gap Latest Ref Range: 5 - 15  17 (H)    To ED with: Emesis/ Abd Pain--Has been out of Insulin SIRS/ Code Sepsis  History: Type 1 Diabetes, Polysubstance abuse  Home DM Meds: Lantus 30 units BID       Humalog 5-10 units TID per SSI  Current Orders: IV Insulin Drip    Patient well known to the Inpatient Diabetes team--Multiple ED visits and admissions since 2020  PCP--[facility] Last seen 12/28/2020--Told PCP at [facility] he lost his insulin and CBG meter at work Advised to Continue to take Lantus and Humalog as prescribed    MD- Note IV Insulin Drip to start this AM.  Agree with current orders.  Will follow during hospitalization    --Will follow patient during hospitalization--  Ambrose Finland RN, MSN, CDE Diabetes Coordinator Inpatient Glycemic Control Team Team Pager: [phone] (8a-5p)

## 2021-01-06 LAB — BASIC METABOLIC PANEL
Anion gap: 7 (ref 5–15)
BUN: 16 mg/dL (ref 6–20)
CO2: 28 mmol/L (ref 22–32)
Calcium: 8.5 mg/dL — ABNORMAL LOW (ref 8.9–10.3)
Chloride: 99 mmol/L (ref 98–111)
Creatinine, Ser: 0.82 mg/dL (ref 0.61–1.24)
GFR, Estimated: >60 mL/min (ref >60)
Glucose, Bld: 160 mg/dL — ABNORMAL HIGH (ref 70–99)
Potassium: 3.2 mmol/L — ABNORMAL LOW (ref 3.5–5.1)
Sodium: 134 mmol/L — ABNORMAL LOW (ref 135–145)

## 2021-01-06 LAB — GLUCOSE, CAPILLARY
Glucose-Capillary: 147 mg/dL — ABNORMAL HIGH (ref 70–99)
Glucose-Capillary: 298 mg/dL — ABNORMAL HIGH (ref 70–99)
Glucose-Capillary: 226 mg/dL — ABNORMAL HIGH (ref 70–99)
Glucose-Capillary: 306 mg/dL — ABNORMAL HIGH (ref 70–99)

## 2021-01-06 LAB — CBC
HCT: 36.5 % — ABNORMAL LOW (ref 39.0–52.0)
Hemoglobin: 12.7 g/dL — ABNORMAL LOW (ref 13.0–17.0)
MCH: 28.7 pg (ref 26.0–34.0)
MCHC: 34.8 g/dL (ref 30.0–36.0)
MCV: 82.6 fL (ref 80.0–100.0)
Platelets: 203 10*3/uL (ref 150–400)
RBC: 4.42 MIL/uL (ref 4.22–5.81)
RDW: 13.7 % (ref 11.5–15.5)
WBC: 11.2 10*3/uL — ABNORMAL HIGH (ref 4.0–10.5)
nRBC: 0 % (ref 0.0–0.2)

## 2021-01-06 LAB — LIPID PANEL
Cholesterol: 81 mg/dL (ref 0–200)
HDL: 29 mg/dL — ABNORMAL LOW (ref >40)
LDL Cholesterol: 43 mg/dL (ref 0–99)
Total CHOL/HDL Ratio: 2.8 RATIO
Triglycerides: 47 mg/dL (ref <150)
VLDL: 9 mg/dL (ref 0–40)

## 2021-01-06 LAB — URINE CULTURE: Culture: NO GROWTH

## 2021-01-06 LAB — LACTIC ACID, PLASMA: Lactic Acid, Venous: 1 mmol/L (ref 0.5–1.9)

## 2021-01-06 MED ORDER — INSULIN GLARGINE 100 UNIT/ML ~~LOC~~ SOLN
25.0000 [IU] | Freq: Two times a day (BID) | SUBCUTANEOUS | Status: DC
  Administered 2021-01-06 – 2021-01-07 (×3): 25 [IU] via SUBCUTANEOUS
  Filled 2021-01-06 (×5): qty 0.25

## 2021-01-06 MED ORDER — METHOCARBAMOL 500 MG PO TABS
750.0000 mg | ORAL_TABLET | Freq: Four times a day (QID) | ORAL | Status: DC | PRN
  Administered 2021-01-06 – 2021-01-07 (×3): 750 mg via ORAL
  Filled 2021-01-06 (×3): qty 2

## 2021-01-06 MED ORDER — HYDROXYZINE HCL 25 MG PO TABS
25.0000 mg | ORAL_TABLET | Freq: Three times a day (TID) | ORAL | Status: DC | PRN
  Administered 2021-01-06: 25 mg via ORAL
  Filled 2021-01-06: qty 1

## 2021-01-06 NOTE — Care Management (Addendum)
Received notification from bedside RN that patient was requesting to speak with me regarding pain medication.  I notified the nurse that I have made two attempts to speak with the patient today.  Both times he has been "sleeping" and did not speak with me.  Patient is exhibiting drug-seeking behavior.  I have discontinued narcotics and benzodiazepines.  No indication to use these medications in this circumstance.  He has a adequate pain regimen of gabapentin, Robaxin, Tylenol, Toradol ordered.  Patient is requesting a new doctor.  There is no indication for this.  The patient is admitted for diabetic ketoacidosis which is resolved.  He is medically stable for discharge at this time.  The reason he has not been discharged is he is unable to get his insulin from the [facility] medication management pharmacy as they are closed on the weekends.  He states he has no money and cant get a ride to [place] to get alternative insulin supply.  Discussed with bedside RN, charge RN  Lolita Patella MD

## 2021-01-06 NOTE — Progress Notes (Signed)
PROGRESS NOTE    Trevor Brown  BMW:[id number] DOB: [birth year] DOA: 01/05/2021 PCP: Rolm Gala, NP   Brief Narrative:  30 y.o. male with medical history significant of poorly controlled type 1 diabetes, hypertension, GERD, depression, polysubstance abuse (heroine, cocaine, marijuana), IV drug user, HCV, DKA, tobacco abuse, cellulitis and abscess, self-reported CAD, who presents with nausea, vomiting, abdominal pain, chest pain and fever.   Patient states he has been out of his insulin for several days.  He developed fever, chills, nausea, vomiting, abdominal pain in the past three days.  No diarrhea.  Abdomen is located in the lower abdomen, constant, sharp, moderate, nonradiating.  Patient states that he has vomited at least 5 times with nonbilious nonbloody vomiting.  Glycemic control improved.  Patient no longer in DKA.  Has been very sleepy and lethargic.  Does awaken when his name is called but quickly falls asleep and does not answer any questions.  Hemodynamically stable.   Assessment & Plan:   Principal Problem:   DKA (diabetic ketoacidosis) (HCC) Active Problems:   Diabetes mellitus type 1, uncontrolled (HCC)   Chest pain   Tobacco abuse   Elevated transaminase level   Abdominal pain   Hyperkalemia   Polysubstance abuse (HCC)   SIRS (systemic inflammatory response syndrome) (HCC)   HTN (hypertension)   GERD (gastroesophageal reflux disease)  DKA (diabetic ketoacidosis) He has been out of insulin for several days.  When asked why he states he just did not pick up his insulin [facility] which [facility] Unfortunately closed on the weekends Patient states he has no money and no way of getting to another pharmacy to pick up prescription As of 6/18 DKA has resolved Plan: Continue IV fluids until patient is tolerating p.o. Lantus 25 units daily Sliding scale coverage As needed antiemetics    Diabetes mellitus type 1,  uncontrolled (HCC) Recent A1c 10.7, poorly controlled.   Patient is taking Humalog and Lantus, but ran out of insulin for several days per patient report.  He just did not pick up his insulin though it is free   SIRS (systemic inflammatory response syndrome)  Patient meets criteria for SIRS with WBC 14.2, tachycardia with heart rate 122, tachypnea with RR 22 SIRS physiology has resolved Suspect secondary to DKA in the setting of polysubstance abuse Plan: Will continue IV fluids for now Discontinue all IV antibiotics Continue to follow blood and urine cultures   Chest pain, resolved Patient self reported history of CAD.  He had chest pain earlier, which has resolved.   Currently no active chest pain and shortness of breath.  Troponin negative Possibly due to substance abuse in the setting of DKA UDS positive for amphetamines, cocaine, cannabis  Plan: No narcotics As needed nitroglycerin and Toradol   Tobacco abuse and polysubstance abuse: Needs counseling on cessation.  TOC consult placed. Plan: No narcotics or benzodiazepines Gabapentin 400 twice daily As needed Robaxin Lidoderm patch Atarax for anxiety   Elevated transaminase level this is chronic issue.  Likely due to history of hepatitis C and polysubstance abuse -Judicious use Tylenol as needed only for fever   Abdominal pain, nausea, vomiting No diarrhea: Lipase normal 23.   CT abdomen/pelvis showed possible esophagogastritis.  DKA may have contributed partially. Plan: IV fluids As needed antiemetics   Hyperkalemia Potassium 5.3, due to DKA Improved out of DKA    HTN (hypertension) Patient not taking medications currently -IV hydralazine as needed   GERD (  gastroesophageal reflux disease) -Pepcid   DVT prophylaxis: SQ Lovenox Code Status: Full Family Communication: None Disposition Plan: Status is: Inpatient  Remains inpatient appropriate because: Unsafe discharge plan  Dispo: The patient is from:  Home              Anticipated d/c is to: Home              Patient currently is medically stable to d/c.   Difficult to place patient No  Patient is essentially medically ready for discharge at this time.  However now unfortunately the medication management [facility] pharmacy is not open on the weekends.  Patient states he has no money and even if he did he is unable to get to [facility] to pick up any insulin that may be provided there.  As a result we are unfortunately obligated to monitor this patient in-house as discharge without insulin in hand is unsafe and will likely result in patient going back into DKA and resultant readmission.   Level of care: Med-Surg  Consultants:  None  Procedures: (Don't include imaging studies which can be auto populated. Include things that cannot be auto populated i.e. Echo, Carotid and venous dopplers, Foley, Bipap, HD, tubes/drains, wound vac, central lines etc) None  Antimicrobials: (specify start and planned stop date. Auto populated tables are space occupying and do not give end dates)   Subjective: Seen and examined.  Sleepy.  Does awaken briefly when his name is called but quickly goes back to sleep.  Does not answer any questions.  Objective: Vitals:   01/05/21 2020 01/05/21 2313 01/06/21 0436 01/06/21 0747  BP: 136/76 138/89 124/74 125/83  Pulse: 67 62 68 (!) 59  Resp: 16 18 16 20   Temp: 99.3 F (37.4 C) 99.8 F (37.7 C) 98.6 F (37 C) 98.1 F (36.7 C)  TempSrc: Oral Oral Oral Oral  SpO2: 100% 98% 100% 100%  Weight:      Height:        Intake/Output Summary (Last 24 hours) at 01/06/2021 1320 Last data filed at 01/06/2021 1141 Gross per 24 hour  Intake 1939.69 ml  Output 3215 ml  Net -1275.31 ml   Filed Weights   01/05/21 0445  Weight: 54.4 kg    Examination:  General exam: No acute distress.  Sleepy and lethargic Respiratory system: Poor respiratory effort.  Lungs clear.  Room air Cardiovascular system: S1-S2, regular rate and  rhythm, no murmurs, no pedal edema  gastrointestinal system: Soft, nontender, nondistended, normal bowel sounds Central nervous system: Sleepy, oriented x1, no focal deficits Extremities: Power unable to be assessed.  Gait not assessed Skin: No rashes, lesions or ulcers Psychiatry: Judgement and insight appear impaired. Mood & affect flattened.     Data Reviewed: I have personally reviewed following labs and imaging studies  CBC: Recent Labs  Lab 01/05/21 0459 01/06/21 0501  WBC 14.2* 11.2*  NEUTROABS 10.6*  --   HGB 15.6 12.7*  HCT 46.6 36.5*  MCV 85.0 82.6  PLT 240 203   Basic Metabolic Panel: Recent Labs  Lab 01/05/21 0459 01/05/21 0800 01/05/21 1114 01/05/21 1258 01/06/21 0501  NA 129* 130* 133*  --  134*  K 5.3* 4.3 3.4*  --  3.2*  CL 90* 95* 99  --  99  CO2 22 21* 24  --  28  GLUCOSE 491* 334* 186*  --  160*  BUN 35* 33* 32*  --  16  CREATININE 1.13 1.04 0.84  --  0.82  CALCIUM  10.1 9.0 8.8*  --  8.5*  MG  --   --   --  1.8  --    GFR: Estimated Creatinine Clearance: 103.2 mL/min (by C-G formula based on SCr of 0.82 mg/dL). Liver Function Tests: Recent Labs  Lab 01/05/21 0459  AST 79*  ALT 76*  ALKPHOS 118  BILITOT 1.5*  PROT 8.8*  ALBUMIN 5.0   Recent Labs  Lab 01/05/21 0459  LIPASE 23   Recent Labs  Lab 01/05/21 0748  AMMONIA 23   Coagulation Profile: Recent Labs  Lab 01/05/21 0800  INR 1.1   Cardiac Enzymes: No results for input(s): CKTOTAL, CKMB, CKMBINDEX, TROPONINI in the last 168 hours. BNP (last 3 results) No results for input(s): PROBNP in the last 8760 hours. HbA1C: No results for input(s): HGBA1C in the last 72 hours. CBG: Recent Labs  Lab 01/05/21 1306 01/05/21 1630 01/05/21 2021 01/06/21 0741 01/06/21 1138  GLUCAP 154* 128* 368* 147* 298*   Lipid Profile: Recent Labs    01/06/21 0501  CHOL 81  HDL 29*  LDLCALC 43  TRIG 47  CHOLHDL 2.8   Thyroid Function Tests: No results for input(s): TSH, T4TOTAL,  FREET4, T3FREE, THYROIDAB in the last 72 hours. Anemia Panel: No results for input(s): VITAMINB12, FOLATE, FERRITIN, TIBC, IRON, RETICCTPCT in the last 72 hours. Sepsis Labs: Recent Labs  Lab 01/05/21 0732 01/05/21 1005 01/05/21 1258 01/06/21 0501  PROCALCITON 0.10  --   --   --   LATICACIDVEN 3.3* 2.2* 1.5 1.0    Recent Results (from the past 240 hour(s))  Resp Panel by RT-PCR (Flu A&B, Covid) Nasopharyngeal Swab     Status: None   Collection Time: 01/05/21  5:12 AM   Specimen: Nasopharyngeal Swab; Nasopharyngeal(NP) swabs in vial transport medium  Result Value Ref Range Status   SARS Coronavirus 2 by RT PCR NEGATIVE NEGATIVE Final    Comment: (NOTE) SARS-CoV-2 target nucleic acids are NOT DETECTED.  The SARS-CoV-2 RNA is generally detectable in upper respiratory specimens during the acute phase of infection. The lowest concentration of SARS-CoV-2 viral copies this assay can detect is 138 copies/mL. A negative result does not preclude SARS-Cov-2 infection and should not be used as the sole basis for treatment or other patient management decisions. A negative result may occur with  improper specimen collection/handling, submission of specimen other than nasopharyngeal swab, presence of viral mutation(s) within the areas targeted by this assay, and inadequate number of viral copies(<138 copies/mL). A negative result must be combined with clinical observations, patient history, and epidemiological information. The expected result is Negative.  Fact Sheet for Patients:  BloggerCourse.comhttps://www.fda.gov/media/152166/download  Fact Sheet for Healthcare Providers:  SeriousBroker.ithttps://www.fda.gov/media/152162/download  This test is no t yet approved or cleared by the [place] FDA and  has been authorized for detection and/or diagnosis of SARS-CoV-2 by FDA under an Emergency Use Authorization (EUA). This EUA will remain  in effect (meaning this test can be used) for the duration of the COVID-19  declaration under Section 564(b)(1) of the Act, 21 U.S.C.section 360bbb-3(b)(1), unless the authorization is terminated  or revoked sooner.       Influenza A by PCR NEGATIVE NEGATIVE Final   Influenza B by PCR NEGATIVE NEGATIVE Final    Comment: (NOTE) The Xpert Xpress SARS-CoV-2/FLU/RSV plus assay is intended as an aid in the diagnosis of influenza from Nasopharyngeal swab specimens and should not be used as a sole basis for treatment. Nasal washings and aspirates are unacceptable for Xpert Xpress SARS-CoV-2/FLU/RSV testing.  Fact Sheet for Patients: BloggerCourse.com  Fact Sheet for Healthcare Providers: SeriousBroker.it  This test is not yet approved or cleared by the [place] FDA and has been authorized for detection and/or diagnosis of SARS-CoV-2 by FDA under an Emergency Use Authorization (EUA). This EUA will remain in effect (meaning this test can be used) for the duration of the COVID-19 declaration under Section 564(b)(1) of the Act, 21 U.S.C. section 360bbb-3(b)(1), unless the authorization is terminated or revoked.  Performed at [facility], [address], [place], [place] [address]   Urine Culture     Status: None   Collection Time: 01/05/21  5:12 AM   Specimen: Urine, Random  Result Value Ref Range Status   Specimen Description   Final    URINE, RANDOM Performed at [facility], [address], [place], [place] [address]    Special Requests   Final    NONE Performed at [facility], [address] [place], [place] [address]    Culture   Final    NO GROWTH Performed at [facility], [address], [place], [place] [address]    Report Status 01/06/2021 FINAL  Final  Culture, blood (Routine X 2) w Reflex to ID Panel     Status: None (Preliminary result)   Collection Time: 01/05/21  6:40 AM   Specimen: BLOOD RIGHT ARM  Result Value Ref Range Status   Specimen  Description BLOOD RIGHT ARM  Final   Special Requests   Final    BOTTLES DRAWN AEROBIC AND ANAEROBIC Blood Culture results may not be optimal due to an inadequate volume of blood received in culture bottles   Culture   Final    NO GROWTH < 24 HOURS Performed at [facility], [address], [place], [place] [address]    Report Status PENDING  Incomplete  Culture, blood (Routine X 2) w Reflex to ID Panel     Status: None (Preliminary result)   Collection Time: 01/05/21  7:32 AM   Specimen: BLOOD  Result Value Ref Range Status   Specimen Description BLOOD BLOOD LEFT HAND  Final   Special Requests   Final    BOTTLES DRAWN AEROBIC AND ANAEROBIC Blood Culture adequate volume   Culture   Final    NO GROWTH < 24 HOURS Performed at [facility], [address] [place], [place] [address]    Report Status PENDING  Incomplete         Radiology Studies: CT ABDOMEN PELVIS W CONTRAST  Result Date: 01/05/2021 CLINICAL DATA:  Vomiting abdominal pain, diabetic, out of insulin EXAM: CT ABDOMEN AND PELVIS WITH CONTRAST TECHNIQUE: Multidetector CT imaging of the abdomen and pelvis was performed using the standard protocol following bolus administration of intravenous contrast. CONTRAST:  OMNIPAQUE IOHEXOL 300 MG/ML  SOLN COMPARISON:  CT 03/01/2019 FINDINGS: Lower chest: No acute abnormality in the upper chest or imaged lung apices. Hepatobiliary: Hepatic attenuation is diffusely diminished with some mild heterogeneity and slight peripheral sparing. More focal hypoattenuation towards the falciform ligament could reflect further focal fatty infiltration. Gallbladder appears decompressed at time of exam. No visible calcified gallstones or biliary ductal dilatation. Pancreas: No pancreatic ductal dilatation or surrounding inflammatory changes. Spleen: Normal in size. No concerning splenic lesions. Adrenals/Urinary Tract: Normal adrenal glands. Kidneys are normally located with  symmetric enhancement. No suspicious renal lesion, urolithiasis or hydronephrosis. Bladder partially decompressed at time of exam. Mild wall thickening may be related to underdistention. Stomach/Bowel: Stopping  assessment of the bowel given a marked paucity of intraperitoneal fat. There is some questionable thickening of the distal thoracic esophagus and gastric rugal folds, the latter finding possibly accentuated by underdistention. Duodenum is unremarkable. Much of the bowel is decompressed. No focal dilatation or more conspicuous wall thickening is seen. Appendix is not visualized. Moderate to large colonic stool burden. No colonic dilatation or wall thickening. Vascular/Lymphatic: No significant vascular findings are present. Evaluation for adenopathy is significantly limited given the paucity of intraperitoneal fat. No visible pathologically enlarged nodes. Reproductive: The prostate and seminal vesicles are unremarkable. Other: Since the 2020 comparison examination there has been significant decrease in the amount of subcutaneous and intraperitoneal fat. No discernible free pelvic air or fluid. Musculoskeletal: No acute or significant osseous findings. IMPRESSION: 1. Some questionable thickening of the distal thoracic esophagus and gastric rugal folds. Could reflect an esophagogastritis in the appropriate clinical setting. Consider outpatient direct visualization. 2. Bladder is partially decompressed at time of exam. Mild wall thickening is nonspecific. Correlate with pending urinalysis. 3. Heterogeneously diminished attenuation of the liver, compatible with intrinsic liver disease and a history of hepatitis C. There is stable focal fatty infiltration along the falciform ligament as well. 4. Diffuse paucity of the subcutaneous and intraperitoneal fat the latter of which complicates assessment of the bowel and abdominal viscera. Correlate with patient's nutritional status. These results were called by telephone  at the time of interpretation on 01/05/2021 at 6:20 am to provider [facility] , who verbally acknowledged these results. Electronically Signed   By: Kreg Shropshire M.D.   On: 01/05/2021 06:20   DG Chest Portable 1 View  Result Date: 01/05/2021 CLINICAL DATA:  Vomiting, abdominal pain EXAM: PORTABLE CHEST 1 VIEW COMPARISON:  03/03/2020 FINDINGS: The lungs are clear without focal consolidation. No convincing radiographic features of edema with normal distribution of the pulmonary vascularity. No visible pneumothorax or effusion the portion of the left costophrenic sulcus is collimated. Telemetry leads overlie the chest. No acute osseous or soft tissue abnormality. IMPRESSION: No acute cardiopulmonary abnormality. Electronically Signed   By: Kreg Shropshire M.D.   On: 01/05/2021 05:26        Scheduled Meds:  enoxaparin (LOVENOX) injection  40 mg Subcutaneous Q24H   gabapentin  400 mg Oral BID   insulin aspart  0-5 Units Subcutaneous QHS   insulin aspart  0-9 Units Subcutaneous TID WC   insulin glargine  25 Units Subcutaneous BID   lidocaine  1 patch Transdermal Q24H   nicotine  21 mg Transdermal Daily   Continuous Infusions:  sodium chloride Stopped (01/05/21 2351)   lactated ringers 75 mL/hr at 01/06/21 0349     LOS: 1 day    Time spent: 25 minutes    Tresa Moore, MD [facility] Pager [phone]  If 7PM-7AM, please contact night-coverage 01/06/2021, 1:20 PM

## 2021-01-06 NOTE — Progress Notes (Addendum)
Patient is uninsured. Ready for DC but out of his insulin. Spoke to patient who confirmed he still uses [facility]. Patient says [facility] has his insulin, he did not go to pick up his refills. Explained option of MATCH letter so patient's discharge does not have to be delayed until [facility] opens on Monday. Patient said he does not have $6 (or any $) to pay copays for his insulin. Asked patient if he would have a ride to [place] if we could get petty cash to cover copays. Patient called his brother then told CSW his brother cannot take him and he has no other ride. Asked if CSW can call patient's brother, he said no. Patient says he understands this means his discharge would be delayed until Monday due to unsafe DC plan due to patient not having his insulin. Patient denies other options. Updated MD.  Alfonso Ramus, LCSW [phone]

## 2021-01-07 LAB — GLUCOSE, CAPILLARY
Glucose-Capillary: 332 mg/dL — ABNORMAL HIGH (ref 70–99)
Glucose-Capillary: 154 mg/dL — ABNORMAL HIGH (ref 70–99)
Glucose-Capillary: 245 mg/dL — ABNORMAL HIGH (ref 70–99)
Glucose-Capillary: 300 mg/dL — ABNORMAL HIGH (ref 70–99)

## 2021-01-07 MED ORDER — ALUM & MAG HYDROXIDE-SIMETH 200-200-20 MG/5ML PO SUSP
30.0000 mL | ORAL | Status: DC | PRN
  Administered 2021-01-07: 30 mL via ORAL
  Filled 2021-01-07: qty 30

## 2021-01-07 MED ORDER — METHOCARBAMOL 500 MG PO TABS
750.0000 mg | ORAL_TABLET | Freq: Four times a day (QID) | ORAL | Status: DC
  Administered 2021-01-07 – 2021-01-08 (×5): 750 mg via ORAL
  Filled 2021-01-07 (×5): qty 2

## 2021-01-07 MED ORDER — GABAPENTIN 400 MG PO CAPS
400.0000 mg | ORAL_CAPSULE | Freq: Three times a day (TID) | ORAL | Status: DC
  Administered 2021-01-07 – 2021-01-08 (×4): 400 mg via ORAL
  Filled 2021-01-07 (×4): qty 1

## 2021-01-07 NOTE — Progress Notes (Signed)
PROGRESS NOTE    Trevor Brown  ZTI:[id number] DOB: 14-Sep-1990 DOA: 01/05/2021 PCP: Rolm Gala, NP   Brief Narrative:  30 y.o. male with medical history significant of poorly controlled type 1 diabetes, hypertension, GERD, depression, polysubstance abuse (heroine, cocaine, marijuana), IV drug user, HCV, DKA, tobacco abuse, cellulitis and abscess, self-reported CAD, who presents with nausea, vomiting, abdominal pain, chest pain and fever.   Patient states he has been out of his insulin for several days.  He developed fever, chills, nausea, vomiting, abdominal pain in the past three days.  No diarrhea.  Abdomen is located in the lower abdomen, constant, sharp, moderate, nonradiating.  Patient states that he has vomited at least 5 times with nonbilious nonbloody vomiting.  Glycemic control improved.  Patient no longer in DKA.  Has been very sleepy and lethargic.  Does awaken when his name is called but quickly falls asleep and does not answer any questions.   6/19: Patient more awake now.  Continues to request pain medications.  Various pain complain including bilateral legs and right-sided back and flank.   Assessment & Plan:   Principal Problem:   DKA (diabetic ketoacidosis) (HCC) Active Problems:   Diabetes mellitus type 1, uncontrolled (HCC)   Chest pain   Tobacco abuse   Elevated transaminase level   Abdominal pain   Hyperkalemia   Polysubstance abuse (HCC)   SIRS (systemic inflammatory response syndrome) (HCC)   HTN (hypertension)   GERD (gastroesophageal reflux disease)  DKA (diabetic ketoacidosis) He has been out of insulin for several days.  When asked why he states he just did not pick up his insulin His insulin is available at medication management clinic which is free Unfortunately closed on the weekends Patient states he has no money and no way of getting to another pharmacy to pick up prescription As of 6/18 DKA has resolved Plan: Discontinue IV  fluids Lantus 25 units twice daily (home dose 30 units twice daily) Sliding scale coverage As needed antiemetics    Diabetes mellitus type 1, uncontrolled (HCC) Recent A1c 10.7, poorly controlled.   Patient is taking Humalog and Lantus, but ran out of insulin for several days per patient report.  He just did not pick up his insulin though it is free Plan on discharge 6/20 a.m.   SIRS (systemic inflammatory response syndrome)  Patient meets criteria for SIRS with WBC 14.2, tachycardia with heart rate 122, tachypnea with RR 22 SIRS physiology has resolved Suspect secondary to DKA in the setting of polysubstance abuse Plan: No negation for antibiotics.  IV fluids discontinued.  Continue to follow cultures   Chest pain, resolved Patient self reported history of CAD.  He had chest pain earlier, which has resolved.   Currently no active chest pain and shortness of breath.  Troponin negative Possibly due to substance abuse in the setting of DKA UDS positive for amphetamines, cocaine, cannabis  Plan: No narcotics As needed nitroglycerin and Toradol  Right-sided rib pain Bilateral leg pain Unclear etiology.  Imaging reassuring. Plan: Nonnarcotic pain regimen   Tobacco abuse and polysubstance abuse: Needs counseling on cessation.  TOC consult placed. Plan: No narcotics or benzodiazepines Gabapentin 400 twice daily As needed Robaxin Lidoderm patch Atarax for anxiety   Elevated transaminase level this is chronic issue.  Likely due to history of hepatitis C and polysubstance abuse -Judicious use Tylenol as needed only for fever   Abdominal pain, nausea, vomiting No diarrhea: Lipase normal 23.   CT abdomen/pelvis showed possible esophagogastritis.  DKA may have contributed partially. Plan: IV fluids As needed antiemetics   Hyperkalemia Potassium 5.3, due to DKA Improved out of DKA    HTN (hypertension) Patient not taking medications currently -IV hydralazine as needed    GERD (gastroesophageal reflux disease) -Pepcid   DVT prophylaxis: SQ Lovenox Code Status: Full Family Communication: None Disposition Plan: Status is: Inpatient  Remains inpatient appropriate because: Unsafe discharge plan  Dispo: The patient is from: Home              Anticipated d/c is to: Home              Patient currently is medically stable to d/c.   Difficult to place patient No  Patient is medically ready for discharge at this time.  However now unfortunately the medication management [facility] pharmacy is not open on the weekends.  Patient states he has no money and even if he did he is unable to get to [facility] to pick up any insulin that may be provided there.  As a result we are unfortunately obligated to monitor this patient in-house as discharge without insulin in hand is unsafe and will likely result in patient going back into DKA and resultant readmission.   Level of care: Med-Surg  Consultants:  None  Procedures:  None  Antimicrobials:    Subjective: Seen and examined.  Sleeping on arrival.  Awakens easily and does answer questions appropriately.  Objective: Vitals:   01/06/21 0436 01/06/21 0747 01/06/21 1928 01/07/21 0438  BP: 124/74 125/83 (!) 150/99 (!) 154/97  Pulse: 68 (!) 59 (!) 59 61  Resp: 16 20 16 18   Temp: 98.6 F (37 C) 98.1 F (36.7 C) 98.2 F (36.8 C) 98.7 F (37.1 C)  TempSrc: Oral Oral Oral Oral  SpO2: 100% 100% 100% 99%  Weight:      Height:        Intake/Output Summary (Last 24 hours) at 01/07/2021 1133 Last data filed at 01/07/2021 0900 Gross per 24 hour  Intake 720 ml  Output 1990 ml  Net -1270 ml   Filed Weights   01/05/21 0445  Weight: 54.4 kg    Examination:  General exam: No acute distress.  Appears frail Respiratory system: Poor respiratory effort.  Lungs clear.  Room air Cardiovascular system: S1-S2, regular rate and rhythm, no murmurs, no pedal edema  gastrointestinal system: Soft, nontender, nondistended, normal  bowel sounds Central nervous system: Oriented x3, no focal deficits Extremities: Power 5 out of 5, gait not assessed Skin: No rashes, lesions or ulcers Psychiatry: Judgement and insight appear impaired. Mood & affect flattened.     Data Reviewed: I have personally reviewed following labs and imaging studies  CBC: Recent Labs  Lab 01/05/21 0459 01/06/21 0501  WBC 14.2* 11.2*  NEUTROABS 10.6*  --   HGB 15.6 12.7*  HCT 46.6 36.5*  MCV 85.0 82.6  PLT 240 203   Basic Metabolic Panel: Recent Labs  Lab 01/05/21 0459 01/05/21 0800 01/05/21 1114 01/05/21 1258 01/06/21 0501  NA 129* 130* 133*  --  134*  K 5.3* 4.3 3.4*  --  3.2*  CL 90* 95* 99  --  99  CO2 22 21* 24  --  28  GLUCOSE 491* 334* 186*  --  160*  BUN 35* 33* 32*  --  16  CREATININE 1.13 1.04 0.84  --  0.82  CALCIUM 10.1 9.0 8.8*  --  8.5*  MG  --   --   --  1.8  --  GFR: Estimated Creatinine Clearance: 103.2 mL/min (by C-G formula based on SCr of 0.82 mg/dL). Liver Function Tests: Recent Labs  Lab 01/05/21 0459  AST 79*  ALT 76*  ALKPHOS 118  BILITOT 1.5*  PROT 8.8*  ALBUMIN 5.0   Recent Labs  Lab 01/05/21 0459  LIPASE 23   Recent Labs  Lab 01/05/21 0748  AMMONIA 23   Coagulation Profile: Recent Labs  Lab 01/05/21 0800  INR 1.1   Cardiac Enzymes: No results for input(s): CKTOTAL, CKMB, CKMBINDEX, TROPONINI in the last 168 hours. BNP (last 3 results) No results for input(s): PROBNP in the last 8760 hours. HbA1C: No results for input(s): HGBA1C in the last 72 hours. CBG: Recent Labs  Lab 01/06/21 0741 01/06/21 1138 01/06/21 1656 01/06/21 2034 01/07/21 0835  GLUCAP 147* 298* 226* 306* 332*   Lipid Profile: Recent Labs    01/06/21 0501  CHOL 81  HDL 29*  LDLCALC 43  TRIG 47  CHOLHDL 2.8   Thyroid Function Tests: No results for input(s): TSH, T4TOTAL, FREET4, T3FREE, THYROIDAB in the last 72 hours. Anemia Panel: No results for input(s): VITAMINB12, FOLATE, FERRITIN,  TIBC, IRON, RETICCTPCT in the last 72 hours. Sepsis Labs: Recent Labs  Lab 01/05/21 0732 01/05/21 1005 01/05/21 1258 01/06/21 0501  PROCALCITON 0.10  --   --   --   LATICACIDVEN 3.3* 2.2* 1.5 1.0    Recent Results (from the past 240 hour(s))  Resp Panel by RT-PCR (Flu A&B, Covid) Nasopharyngeal Swab     Status: None   Collection Time: 01/05/21  5:12 AM   Specimen: Nasopharyngeal Swab; Nasopharyngeal(NP) swabs in vial transport medium  Result Value Ref Range Status   SARS Coronavirus 2 by RT PCR NEGATIVE NEGATIVE Final    Comment: (NOTE) SARS-CoV-2 target nucleic acids are NOT DETECTED.  The SARS-CoV-2 RNA is generally detectable in upper respiratory specimens during the acute phase of infection. The lowest concentration of SARS-CoV-2 viral copies this assay can detect is 138 copies/mL. A negative result does not preclude SARS-Cov-2 infection and should not be used as the sole basis for treatment or other patient management decisions. A negative result may occur with  improper specimen collection/handling, submission of specimen other than nasopharyngeal swab, presence of viral mutation(s) within the areas targeted by this assay, and inadequate number of viral copies(<138 copies/mL). A negative result must be combined with clinical observations, patient history, and epidemiological information. The expected result is Negative.  Fact Sheet for Patients:  BloggerCourse.com  Fact Sheet for Healthcare Providers:  SeriousBroker.it  This test is no t yet approved or cleared by the [place] FDA and  has been authorized for detection and/or diagnosis of SARS-CoV-2 by FDA under an Emergency Use Authorization (EUA). This EUA will remain  in effect (meaning this test can be used) for the duration of the COVID-19 declaration under Section 564(b)(1) of the Act, 21 U.S.C.section 360bbb-3(b)(1), unless the authorization is terminated   or revoked sooner.       Influenza A by PCR NEGATIVE NEGATIVE Final   Influenza B by PCR NEGATIVE NEGATIVE Final    Comment: (NOTE) The Xpert Xpress SARS-CoV-2/FLU/RSV plus assay is intended as an aid in the diagnosis of influenza from Nasopharyngeal swab specimens and should not be used as a sole basis for treatment. Nasal washings and aspirates are unacceptable for Xpert Xpress SARS-CoV-2/FLU/RSV testing.  Fact Sheet for Patients: BloggerCourse.com  Fact Sheet for Healthcare Providers: SeriousBroker.it  This test is not yet approved or cleared by the  [place] [employer] FDA and has been authorized for detection and/or diagnosis of SARS-CoV-2 by FDA under an TEFL teachermergency Use Authorization (EUA). This EUA will remain in effect (meaning this test can be used) for the duration of the COVID-19 declaration under Section 564(b)(1) of the Act, 21 U.S.C. section 360bbb-3(b)(1), unless the authorization is terminated or revoked.  Performed at [facility], [address] [place], [place] [address]   Urine Culture     Status: None   Collection Time: 01/05/21  5:12 AM   Specimen: Urine, Random  Result Value Ref Range Status   Specimen Description   Final    URINE, RANDOM Performed at [facility], [address] [place], [place] [address]    Special Requests   Final    NONE Performed at [facility], [address] [place], [place] [address]    Culture   Final    NO GROWTH Performed at [facility], [address] [place], [place] [address]    Report Status 01/06/2021 FINAL  Final  Culture, blood (Routine X 2) w Reflex to ID Panel     Status: None (Preliminary result)   Collection Time: 01/05/21  6:40 AM   Specimen: BLOOD RIGHT ARM  Result Value Ref Range Status   Specimen Description BLOOD RIGHT ARM  Final   Special Requests   Final    BOTTLES DRAWN AEROBIC AND ANAEROBIC Blood Culture results  may not be optimal due to an inadequate volume of blood received in culture bottles   Culture   Final    NO GROWTH 2 DAYS Performed at [facility], [address] [place], [place] [address]    Report Status PENDING  Incomplete  Culture, blood (Routine X 2) w Reflex to ID Panel     Status: None (Preliminary result)   Collection Time: 01/05/21  7:32 AM   Specimen: BLOOD  Result Value Ref Range Status   Specimen Description BLOOD BLOOD LEFT HAND  Final   Special Requests   Final    BOTTLES DRAWN AEROBIC AND ANAEROBIC Blood Culture adequate volume   Culture   Final    NO GROWTH 2 DAYS Performed at [facility], [address] [place], [place] [address]    Report Status PENDING  Incomplete         Radiology Studies: No results found.      Scheduled Meds:  enoxaparin (LOVENOX) injection  40 mg Subcutaneous Q24H   gabapentin  400 mg Oral TID   insulin aspart  0-5 Units Subcutaneous QHS   insulin aspart  0-9 Units Subcutaneous TID WC   insulin glargine  25 Units Subcutaneous BID   methocarbamol  750 mg Oral QID   nicotine  21 mg Transdermal Daily   Continuous Infusions:  sodium chloride Stopped (01/05/21 2351)   lactated ringers 75 mL/hr at 01/06/21 1617     LOS: 2 days    Time spent: 25 minutes    Tresa MooreSudheer B Sanjith Siwek, MD [facility] Pager [phone]  If 7PM-7AM, please contact night-coverage 01/07/2021, 11:33 AM

## 2021-01-08 ENCOUNTER — Other Ambulatory Visit: Payer: Self-pay

## 2021-01-08 LAB — GLUCOSE, CAPILLARY
Glucose-Capillary: 312 mg/dL — ABNORMAL HIGH (ref 70–99)
Glucose-Capillary: 327 mg/dL — ABNORMAL HIGH (ref 70–99)

## 2021-01-08 MED ORDER — INSULIN GLARGINE 100 UNIT/ML ~~LOC~~ SOLN
30.0000 [IU] | Freq: Two times a day (BID) | SUBCUTANEOUS | Status: DC
  Administered 2021-01-08: 30 [IU] via SUBCUTANEOUS
  Filled 2021-01-08 (×3): qty 0.3

## 2021-01-08 MED ORDER — INSULIN LISPRO (1 UNIT DIAL) 100 UNIT/ML (KWIKPEN)
PEN_INJECTOR | SUBCUTANEOUS | 1 refills | Status: DC
  Filled 2021-01-08: qty 15, 50d supply, fill #0
  Filled 2021-02-22: qty 15, 50d supply, fill #1

## 2021-01-08 MED ORDER — BASAGLAR KWIKPEN 100 UNIT/ML ~~LOC~~ SOPN
30.0000 [IU] | PEN_INJECTOR | Freq: Two times a day (BID) | SUBCUTANEOUS | 11 refills | Status: DC
  Filled 2021-01-08: qty 15, 25d supply, fill #0
  Filled 2021-02-22: qty 15, 25d supply, fill #1
  Filled 2021-04-17: qty 15, 25d supply, fill #2

## 2021-01-08 MED ORDER — RIGHTEST GL300 LANCETS MISC
11 refills | Status: DC
  Filled 2021-01-08: qty 100, 30d supply, fill #0
  Filled 2021-02-22: qty 100, 30d supply, fill #1
  Filled 2021-04-17: qty 100, 30d supply, fill #2

## 2021-01-08 MED ORDER — FREESTYLE LITE TEST VI STRP
ORAL_STRIP | 5 refills | Status: AC
  Filled 2021-01-08: qty 60, fill #0

## 2021-01-08 MED ORDER — COMFORT EZ PEN NEEDLES 32G X 4 MM MISC
0 refills | Status: DC
  Filled 2021-01-08: qty 100, 30d supply, fill #0

## 2021-01-08 MED ORDER — RIGHTEST GS550 BLOOD GLUCOSE VI STRP
ORAL_STRIP | 11 refills | Status: DC
  Filled 2021-01-08: qty 100, 30d supply, fill #0
  Filled 2021-02-22: qty 100, 30d supply, fill #1
  Filled 2021-04-17: qty 100, 30d supply, fill #2

## 2021-01-08 MED ORDER — KETOROLAC TROMETHAMINE 30 MG/ML IJ SOLN
30.0000 mg | Freq: Once | INTRAMUSCULAR | Status: AC
  Administered 2021-01-08: 30 mg via INTRAVENOUS
  Filled 2021-01-08: qty 1

## 2021-01-08 MED ORDER — INSULIN PEN NEEDLE 32G X 4 MM MISC
11 refills | Status: DC
  Filled 2021-01-08: qty 100, 20d supply, fill #0
  Filled 2021-02-22: qty 100, 20d supply, fill #1
  Filled 2021-04-17: qty 100, 20d supply, fill #2
  Filled 2021-06-01: qty 100, 20d supply, fill #3
  Filled 2021-08-15: qty 100, 20d supply, fill #4
  Filled 2021-09-11: qty 100, 20d supply, fill #5

## 2021-01-08 MED ORDER — "BD VEO INSULIN SYRINGE U/F 31G X 15/64"" 1 ML MISC"
0 refills | Status: DC
  Filled 2021-01-08: qty 30, 30d supply, fill #0

## 2021-01-08 MED ORDER — GABAPENTIN 400 MG PO CAPS
400.0000 mg | ORAL_CAPSULE | Freq: Three times a day (TID) | ORAL | 0 refills | Status: DC
  Filled 2021-01-08: qty 42, 14d supply, fill #0

## 2021-01-08 NOTE — Progress Notes (Signed)
Discharge instructions and medication given to patient.  Patient discharge with all pertinent information, medicaton/prescriptions and personal belongings.  Patient able to teach back discharge instructions.  IV site d/ced.  No acute distress noted. Awaiting transportation to arrive for discharge.

## 2021-01-08 NOTE — TOC Transition Note (Addendum)
Transition of Care [facility]) - CM/SW Discharge Note   Patient Details  Name: Trevor Brown MRN: [id number] Date of Birth: [birth year]  [facility] of [facility] CM/SW Contact:  Beverly Sessions, RN Phone Number: 01/08/2021, 2:30 PM   Clinical Narrative:    Patient discharging today Patient confirms he will be discharging to his brothers home. Demographics updated on chart  Patient provided with glucometer kit.  Patient has been provided multiple glucometer  from the hospital in the past.  Patient states "it's hard when you are homeless and boucing from place to place to keep up with them"  Educated patient on the importance of keeping up with the glucometer in order to manage his sugars  Discharge medications delivered to patient in room prior to discharge from Medication Management . Discharge transport set up through [facility] transport Patient to follow up at [facility]  and continue using Medication Management    Patient declined substance abuse resources    Final next level of care: Home/Self Care     Patient Goals and CMS Choice        Discharge Placement                       Discharge Plan and Services                                     Social Determinants of Health (SDOH) Interventions     Readmission Risk Interventions Readmission Risk Prevention Plan 03/08/2020 03/08/2020 10/15/2018  Transportation Screening - Complete Complete  PCP or Specialist Appt within 3-5 Days Complete - Complete  Palliative Care Screening - Not Applicable -  Medication Review (RN Care Manager) - Complete Complete  Some recent data might be hidden

## 2021-01-08 NOTE — Discharge Summary (Signed)
Physician Discharge Summary  Trevor Brown WUJ:[id number] DOB: 01-Mar-1991 DOA: 01/05/2021  PCP: [name], [name], NP  Admit date: 01/05/2021 Discharge date: 01/08/2021  Admitted From: Home Disposition: Home  Recommendations for Outpatient Follow-up:  Follow up with PCP in 1-2 weeks   Home Health: No Equipment/Devices: None  Discharge Condition: Stable CODE STATUS: Full Diet recommendation: Carb modified  Brief/Interim Summary: 30 y.o. male with medical history significant of poorly controlled type 1 diabetes, hypertension, GERD, depression, polysubstance abuse (heroine, cocaine, marijuana), IV drug user, HCV, DKA, tobacco abuse, cellulitis and abscess, self-reported CAD, who presents with nausea, vomiting, abdominal pain, chest pain and fever.   Patient states he has been out of his insulin for several days.  He developed fever, chills, nausea, vomiting, abdominal pain in the past three days.  No diarrhea.  Abdomen is located in the lower abdomen, constant, sharp, moderate, nonradiating.  Patient states that he has vomited at least 5 times with nonbilious nonbloody vomiting.   Glycemic control improved.  Patient no longer in DKA.  Has been very sleepy and lethargic.  Does awaken when his name is called but quickly falls asleep and does not answer any questions.   6/19: Patient more awake now.  Continues to request pain medications.  Various pain complain including bilateral legs and right-sided back and flank.  On day of discharge glycemic control adequate.  Patient continues to endorse pain, worse in the legs than in the left flank.  Reviewed with patient that x-ray did not demonstrate any fracture and this is likely musculoskeletal in nature.  Short course gabapentin has been prescribed.  After discussion with TOC and patient we will get patient's medications filled at [facility] and bring these medications and hand them to patient at time of discharge.  That  way we can say with assurance that he has medications in hand at time of discharge.  Discharge Diagnoses:  Principal Problem:   DKA (diabetic ketoacidosis) (HCC) Active Problems:   Diabetes mellitus type 1, uncontrolled (HCC)   Chest pain   Tobacco abuse   Elevated transaminase level   Abdominal pain   Hyperkalemia   Polysubstance abuse (HCC)   SIRS (systemic inflammatory response syndrome) (HCC)   HTN (hypertension)   GERD (gastroesophageal reflux disease)  DKA (diabetic ketoacidosis) He has been out of insulin for several days. When asked why he states he just did not pick up his insulin His insulin is available at medication management clinic which is free Unfortunately closed on the weekends Patient was maintained in the hospital so we could make sure he gets his medications in hand.  These were delivered to bedside at time of discharge   Diabetes mellitus type 1, uncontrolled (HCC) Recent A1c 10.7, poorly controlled.   Patient is taking Humalog and Lantus, but ran out of insulin for several days per patient report. He just did not pick up his insulin though it is free At time of discharge will get medications [facility].  Basaglar 30units twice daily, Humalog with syringes and pens, short course gabapentin.  Patient stressed importance of medication adherence.  Another glucometer will also be provided.   SIRS (systemic inflammatory response syndrome)  Patient meets criteria for SIRS with WBC 14.2, tachycardia with heart rate 122, tachypnea with RR 22 SIRS physiology has resolved Suspect secondary to DKA in the setting of polysubstance abuse Plan: No indication for antibiotics.  Sepsis ruled out   Chest pain, resolved Patient self reported history  of CAD.  He had chest pain earlier, which has resolved.   Currently no active chest pain and shortness of breath.  Troponin negative Possibly due to substance abuse in the setting of DKA UDS  positive for amphetamines, cocaine, cannabis    Right-sided rib pain Bilateral leg pain Unclear etiology.  Imaging reassuring. Plan: Nonnarcotic pain regimen  Discharge Instructions  Discharge Instructions     Diet - low sodium heart healthy   Complete by: As directed    Increase activity slowly   Complete by: As directed       Allergies as of 01/08/2021       Reactions   Bee Venom Anaphylaxis   Ibuprofen Itching, Rash, Other (See Comments)   Other reaction(s): Other (See Comments) Stomach upset Reaction:  GI upset    Tramadol Hives, Swelling   Vancomycin Rash, Other (See Comments)   Reaction:  Red man's syndrome         Medication List     STOP taking these medications    BD Veo Insulin Syringe U/F 31G X 15/64" 0.3 ML Misc Generic drug: Insulin Syringe-Needle U-100   famotidine 20 MG tablet Commonly known as: PEPCID   insulin glargine 100 UNIT/ML injection Commonly known as: LANTUS Replaced by: Basaglar KwikPen 100 UNIT/ML   senna-docusate 8.6-50 MG tablet Commonly known as: Senokot-S       TAKE these medications    Basaglar KwikPen 100 UNIT/ML Inject 30 Units into the skin 2 (two) times daily. Replaces: insulin glargine 100 UNIT/ML injection   FREESTYLE LITE test strip Generic drug: glucose blood To be used as directed for checking her blood sugar What changed: Another medication with the same name was added. Make sure you understand how and when to take each.   Rightest GS550 Blood Glucose test strip Generic drug: glucose blood USE AS DIRECTED. What changed: You were already taking a medication with the same name, and this prescription was added. Make sure you understand how and when to take each.   gabapentin 400 MG capsule Commonly known as: NEURONTIN Take 1 capsule (400 mg total) by mouth 3 (three) times daily for 14 days. What changed:  how much to take when to take this   insulin lispro 100 UNIT/ML KwikPen Commonly known as:  HUMALOG INJECT 5-10 UNITS INTO THE SKIN BEFORE MEALS PER SLIDING SCALE. IF BLOOD SUGAR IS 120-250=5 UNITS;251-350=8 UNITS; OVER 350=10 UNITS   Insulin Pen Needle 32G X 4 MM Misc USE AS DIRECTED WITH INSULIN PENS.   Rightest GL300 Lancets Misc USE AS DIRECTED.               Durable Medical Equipment  (From admission, onward)           Start     Ordered   01/08/21 0926  DME Glucometer  Once        01/08/21 2482            Allergies  Allergen Reactions   Bee Venom Anaphylaxis   Ibuprofen Itching, Rash and Other (See Comments)    Other reaction(s): Other (See Comments) Stomach upset Reaction:  GI upset    Tramadol Hives and Swelling   Vancomycin Rash and Other (See Comments)    Reaction:  Red man's syndrome     Consultations: None   Procedures/Studies: CT ABDOMEN PELVIS W CONTRAST  Result Date: 01/05/2021 CLINICAL DATA:  Vomiting abdominal pain, diabetic, out of insulin EXAM: CT ABDOMEN AND PELVIS WITH CONTRAST TECHNIQUE: Multidetector CT imaging of  the abdomen and pelvis was performed using the standard protocol following bolus administration of intravenous contrast. CONTRAST:  OMNIPAQUE IOHEXOL 300 MG/ML  SOLN COMPARISON:  CT 03/01/2019 FINDINGS: Lower chest: No acute abnormality in the upper chest or imaged lung apices. Hepatobiliary: Hepatic attenuation is diffusely diminished with some mild heterogeneity and slight peripheral sparing. More focal hypoattenuation towards the falciform ligament could reflect further focal fatty infiltration. Gallbladder appears decompressed at time of exam. No visible calcified gallstones or biliary ductal dilatation. Pancreas: No pancreatic ductal dilatation or surrounding inflammatory changes. Spleen: Normal in size. No concerning splenic lesions. Adrenals/Urinary Tract: Normal adrenal glands. Kidneys are normally located with symmetric enhancement. No suspicious renal lesion, urolithiasis or hydronephrosis. Bladder  partially decompressed at time of exam. Mild wall thickening may be related to underdistention. Stomach/Bowel: Stopping assessment of the bowel given a marked paucity of intraperitoneal fat. There is some questionable thickening of the distal thoracic esophagus and gastric rugal folds, the latter finding possibly accentuated by underdistention. Duodenum is unremarkable. Much of the bowel is decompressed. No focal dilatation or more conspicuous wall thickening is seen. Appendix is not visualized. Moderate to large colonic stool burden. No colonic dilatation or wall thickening. Vascular/Lymphatic: No significant vascular findings are present. Evaluation for adenopathy is significantly limited given the paucity of intraperitoneal fat. No visible pathologically enlarged nodes. Reproductive: The prostate and seminal vesicles are unremarkable. Other: Since the 2020 comparison examination there has been significant decrease in the amount of subcutaneous and intraperitoneal fat. No discernible free pelvic air or fluid. Musculoskeletal: No acute or significant osseous findings. IMPRESSION: 1. Some questionable thickening of the distal thoracic esophagus and gastric rugal folds. Could reflect an esophagogastritis in the appropriate clinical setting. Consider outpatient direct visualization. 2. Bladder is partially decompressed at time of exam. Mild wall thickening is nonspecific. Correlate with pending urinalysis. 3. Heterogeneously diminished attenuation of the liver, compatible with intrinsic liver disease and a history of hepatitis C. There is stable focal fatty infiltration along the falciform ligament as well. 4. Diffuse paucity of the subcutaneous and intraperitoneal fat the latter of which complicates assessment of the bowel and abdominal viscera. Correlate with patient's nutritional status. These results were called by telephone at the time of interpretation on 01/05/2021 at 6:20 am to provider [facility] , who  verbally acknowledged these results. Electronically Signed   By: Kreg Shropshire M.D.   On: 01/05/2021 06:20   DG Chest Portable 1 View  Result Date: 01/05/2021 CLINICAL DATA:  Vomiting, abdominal pain EXAM: PORTABLE CHEST 1 VIEW COMPARISON:  03/03/2020 FINDINGS: The lungs are clear without focal consolidation. No convincing radiographic features of edema with normal distribution of the pulmonary vascularity. No visible pneumothorax or effusion the portion of the left costophrenic sulcus is collimated. Telemetry leads overlie the chest. No acute osseous or soft tissue abnormality. IMPRESSION: No acute cardiopulmonary abnormality. Electronically Signed   By: Kreg Shropshire M.D.   On: 01/05/2021 05:26   (Echo, Carotid, EGD, Colonoscopy, ERCP)    Subjective: Seen and examined at the time of discharge.  Stable, no distress.  Continues to endorse left chest wall pain.  Discharge Exam: Vitals:   01/08/21 0804 01/08/21 1244  BP: (!) 141/94 92/70  Pulse: 65 82  Resp: 18 16  Temp: 99.6 F (37.6 C) 98.3 F (36.8 C)  SpO2: 100% 100%   Vitals:   01/07/21 1932 01/08/21 0416 01/08/21 0804 01/08/21 1244  BP: (!) 146/91 (!) 147/95 (!) 141/94 92/70  Pulse: 73 66 65 82  Resp: Temp: 99.9 F (37.7 C) 99 F (37.2 C) 99.6 F (37.6 C) 98.3 F (36.8 C)  TempSrc: Oral Oral Oral   SpO2: 98% 100% 100% 100%  Weight:      Height:        General: Pt is alert, awake, not in acute distress Cardiovascular: RRR, S1/S2 +, no rubs, no gallops Respiratory: CTA bilaterally, no wheezing, no rhonchi Abdominal: Soft, NT, ND, bowel sounds + Extremities: no edema, no cyanosis    The results of significant diagnostics from this hospitalization (including imaging, microbiology, ancillary and laboratory) are listed below for reference.     Microbiology: Recent Results (from the past 240 hour(s))  Resp Panel by RT-PCR (Flu A&B, Covid) Nasopharyngeal Swab     Status: None   Collection Time: 01/05/21   5:12 AM   Specimen: Nasopharyngeal Swab; Nasopharyngeal(NP) swabs in vial transport medium  Result Value Ref Range Status   SARS Coronavirus 2 by RT PCR NEGATIVE NEGATIVE Final    Comment: (NOTE) SARS-CoV-2 target nucleic acids are NOT DETECTED.  The SARS-CoV-2 RNA is generally detectable in upper respiratory specimens during the acute phase of infection. The lowest concentration of SARS-CoV-2 viral copies this assay can detect is 138 copies/mL. A negative result does not preclude SARS-Cov-2 infection and should not be used as the sole basis for treatment or other patient management decisions. A negative result may occur with  improper specimen collection/handling, submission of specimen other than nasopharyngeal swab, presence of viral mutation(s) within the areas targeted by this assay, and inadequate number of viral copies(<138 copies/mL). A negative result must be combined with clinical observations, patient history, and epidemiological information. The expected result is Negative.  Fact Sheet for Patients:  BloggerCourse.com  Fact Sheet for Healthcare Providers:  SeriousBroker.it  This test is no t yet approved or cleared by the [place] FDA and  has been authorized for detection and/or diagnosis of SARS-CoV-2 by FDA under an Emergency Use Authorization (EUA). This EUA will remain  in effect (meaning this test can be used) for the duration of the COVID-19 declaration under Section 564(b)(1) of the Act, 21 U.S.C.section 360bbb-3(b)(1), unless the authorization is terminated  or revoked sooner.       Influenza A by PCR NEGATIVE NEGATIVE Final   Influenza B by PCR NEGATIVE NEGATIVE Final    Comment: (NOTE) The Xpert Xpress SARS-CoV-2/FLU/RSV plus assay is intended as an aid in the diagnosis of influenza from Nasopharyngeal swab specimens and should not be used as a sole basis for treatment. Nasal washings and aspirates  are unacceptable for Xpert Xpress SARS-CoV-2/FLU/RSV testing.  Fact Sheet for Patients: BloggerCourse.com  Fact Sheet for Healthcare Providers: SeriousBroker.it  This test is not yet approved or cleared by the [place] FDA and has been authorized for detection and/or diagnosis of SARS-CoV-2 by FDA under an Emergency Use Authorization (EUA). This EUA will remain in effect (meaning this test can be used) for the duration of the COVID-19 declaration under Section 564(b)(1) of the Act, 21 U.S.C. section 360bbb-3(b)(1), unless the authorization is terminated or revoked.  Performed at [facility], [address], [place], [place] [address]   Urine Culture     Status: None   Collection Time: 01/05/21  5:12 AM   Specimen: Urine, Random  Result Value Ref Range Status   Specimen Description   Final    URINE, RANDOM Performed at [facility], [address], [place], [place] [address]  Special Requests   Final    NONE Performed at [facility], [address] [place], [place] [address]    Culture   Final    NO GROWTH Performed at [facility], [address], [place], [place] [address]    Report Status 01/06/2021 FINAL  Final  Culture, blood (Routine X 2) w Reflex to ID Panel     Status: None (Preliminary result)   Collection Time: 01/05/21  6:40 AM   Specimen: BLOOD RIGHT ARM  Result Value Ref Range Status   Specimen Description BLOOD RIGHT ARM  Final   Special Requests   Final    BOTTLES DRAWN AEROBIC AND ANAEROBIC Blood Culture results may not be optimal due to an inadequate volume of blood received in culture bottles   Culture   Final    NO GROWTH 2 DAYS Performed at [facility], [address] [place], [place] [address]    Report Status PENDING  Incomplete  Culture, blood (Routine X 2) w Reflex to ID Panel     Status: None (Preliminary result)   Collection Time:  01/05/21  7:32 AM   Specimen: BLOOD  Result Value Ref Range Status   Specimen Description BLOOD BLOOD LEFT HAND  Final   Special Requests   Final    BOTTLES DRAWN AEROBIC AND ANAEROBIC Blood Culture adequate volume   Culture   Final    NO GROWTH 2 DAYS Performed at [facility], [address] [place], [place] [address]    Report Status PENDING  Incomplete     Labs: BNP (last 3 results) Recent Labs    01/05/21 0459  BNP 14.1   Basic Metabolic Panel: Recent Labs  Lab 01/05/21 0459 01/05/21 0800 01/05/21 1114 01/05/21 1258 01/06/21 0501  NA 129* 130* 133*  --  134*  K 5.3* 4.3 3.4*  --  3.2*  CL 90* 95* 99  --  99  CO2 22 21* 24  --  28  GLUCOSE 491* 334* 186*  --  160*  BUN 35* 33* 32*  --  16  CREATININE 1.13 1.04 0.84  --  0.82  CALCIUM 10.1 9.0 8.8*  --  8.5*  MG  --   --   --  1.8  --    Liver Function Tests: Recent Labs  Lab 01/05/21 0459  AST 79*  ALT 76*  ALKPHOS 118  BILITOT 1.5*  PROT 8.8*  ALBUMIN 5.0   Recent Labs  Lab 01/05/21 0459  LIPASE 23   Recent Labs  Lab 01/05/21 0748  AMMONIA 23   CBC: Recent Labs  Lab 01/05/21 0459 01/06/21 0501  WBC 14.2* 11.2*  NEUTROABS 10.6*  --   HGB 15.6 12.7*  HCT 46.6 36.5*  MCV 85.0 82.6  PLT 240 203   Cardiac Enzymes: No results for input(s): CKTOTAL, CKMB, CKMBINDEX, TROPONINI in the last 168 hours. BNP: Invalid input(s): POCBNP CBG: Recent Labs  Lab 01/07/21 1331 01/07/21 1722 01/07/21 2057 01/08/21 0746 01/08/21 1227  GLUCAP 154* 245* 300* 312* 327*   D-Dimer No results for input(s): DDIMER in the last 72 hours. Hgb A1c No results for input(s): HGBA1C in the last 72 hours. Lipid Profile Recent Labs    01/06/21 0501  CHOL 81  HDL 29*  LDLCALC 43  TRIG 47  CHOLHDL 2.8   Thyroid function studies No results for input(s): TSH, T4TOTAL, T3FREE, THYROIDAB in the last 72 hours.  Invalid input(s): FREET3 Anemia work up No  results for input(s): VITAMINB12, FOLATE,  FERRITIN, TIBC, IRON, RETICCTPCT in the last 72 hours. Urinalysis    Component Value Date/Time   COLORURINE STRAW (A) 01/05/2021 0459   APPEARANCEUR CLEAR (A) 01/05/2021 0459   APPEARANCEUR Clear 07/08/2019 1825   LABSPEC 1.031 (H) 01/05/2021 0459   LABSPEC 1.026 04/06/2014 0751   PHURINE 5.0 01/05/2021 0459   GLUCOSEU >=500 (A) 01/05/2021 0459   GLUCOSEU >=500 04/06/2014 0751   HGBUR NEGATIVE 01/05/2021 0459   BILIRUBINUR NEGATIVE 01/05/2021 0459   BILIRUBINUR Negative 07/08/2019 1825   BILIRUBINUR Negative 04/06/2014 0751   KETONESUR 80 (A) 01/05/2021 0459   PROTEINUR NEGATIVE 01/05/2021 0459   NITRITE NEGATIVE 01/05/2021 0459   LEUKOCYTESUR NEGATIVE 01/05/2021 0459   LEUKOCYTESUR Negative 04/06/2014 0751   Sepsis Labs Invalid input(s): PROCALCITONIN,  WBC,  LACTICIDVEN Microbiology Recent Results (from the past 240 hour(s))  Resp Panel by RT-PCR (Flu A&B, Covid) Nasopharyngeal Swab     Status: None   Collection Time: 01/05/21  5:12 AM   Specimen: Nasopharyngeal Swab; Nasopharyngeal(NP) swabs in vial transport medium  Result Value Ref Range Status   SARS Coronavirus 2 by RT PCR NEGATIVE NEGATIVE Final    Comment: (NOTE) SARS-CoV-2 target nucleic acids are NOT DETECTED.  The SARS-CoV-2 RNA is generally detectable in upper respiratory specimens during the acute phase of infection. The lowest concentration of SARS-CoV-2 viral copies this assay can detect is 138 copies/mL. A negative result does not preclude SARS-Cov-2 infection and should not be used as the sole basis for treatment or other patient management decisions. A negative result may occur with  improper specimen collection/handling, submission of specimen other than nasopharyngeal swab, presence of viral mutation(s) within the areas targeted by this assay, and inadequate number of viral copies(<138 copies/mL). A negative result must be combined with clinical observations, patient history, and  epidemiological information. The expected result is Negative.  Fact Sheet for Patients:  BloggerCourse.com  Fact Sheet for Healthcare Providers:  SeriousBroker.it  This test is no t yet approved or cleared by the [place] FDA and  has been authorized for detection and/or diagnosis of SARS-CoV-2 by FDA under an Emergency Use Authorization (EUA). This EUA will remain  in effect (meaning this test can be used) for the duration of the COVID-19 declaration under Section 564(b)(1) of the Act, 21 U.S.C.section 360bbb-3(b)(1), unless the authorization is terminated  or revoked sooner.       Influenza A by PCR NEGATIVE NEGATIVE Final   Influenza B by PCR NEGATIVE NEGATIVE Final    Comment: (NOTE) The Xpert Xpress SARS-CoV-2/FLU/RSV plus assay is intended as an aid in the diagnosis of influenza from Nasopharyngeal swab specimens and should not be used as a sole basis for treatment. Nasal washings and aspirates are unacceptable for Xpert Xpress SARS-CoV-2/FLU/RSV testing.  Fact Sheet for Patients: BloggerCourse.com  Fact Sheet for Healthcare Providers: SeriousBroker.it  This test is not yet approved or cleared by the [place] FDA and has been authorized for detection and/or diagnosis of SARS-CoV-2 by FDA under an Emergency Use Authorization (EUA). This EUA will remain in effect (meaning this test can be used) for the duration of the COVID-19 declaration under Section 564(b)(1) of the Act, 21 U.S.C. section 360bbb-3(b)(1), unless the authorization is terminated or revoked.  Performed at [facility], [address], [place], [place] [address]   Urine Culture     Status: None   Collection Time: 01/05/21  5:12 AM   Specimen: Urine, Random  Result Value Ref Range  Status   Specimen Description   Final    URINE, RANDOM Performed at [facility], [address], [place], [place] [address]    Special Requests   Final    NONE Performed at [facility], [address] [place], [place] [address]    Culture   Final    NO GROWTH Performed at [facility], [address] [place], [place] [address]    Report Status 01/06/2021 FINAL  Final  Culture, blood (Routine X 2) w Reflex to ID Panel     Status: None (Preliminary result)   Collection Time: 01/05/21  6:40 AM   Specimen: BLOOD RIGHT ARM  Result Value Ref Range Status   Specimen Description BLOOD RIGHT ARM  Final   Special Requests   Final    BOTTLES DRAWN AEROBIC AND ANAEROBIC Blood Culture results may not be optimal due to an inadequate volume of blood received in culture bottles   Culture   Final    NO GROWTH 2 DAYS Performed at [facility], [address] [place], [place] [address]    Report Status PENDING  Incomplete  Culture, blood (Routine X 2) w Reflex to ID Panel     Status: None (Preliminary result)   Collection Time: 01/05/21  7:32 AM   Specimen: BLOOD  Result Value Ref Range Status   Specimen Description BLOOD BLOOD LEFT HAND  Final   Special Requests   Final    BOTTLES DRAWN AEROBIC AND ANAEROBIC Blood Culture adequate volume   Culture   Final    NO GROWTH 2 DAYS Performed at [facility], [address] [place], [place] [address]    Report Status PENDING  Incomplete     Time coordinating discharge: Over 30 minutes  SIGNED:   Tresa Moore, MD  [facility] 01/08/2021, 1:19 PM Pager   If 7PM-7AM, please contact night-coverage

## 2021-01-08 NOTE — Progress Notes (Signed)
Patient discharged via transport. No acute distress noted. Care relinquished.

## 2021-01-10 LAB — CULTURE, BLOOD (ROUTINE X 2)
Culture: NO GROWTH
Culture: NO GROWTH
Special Requests: ADEQUATE

## 2021-01-23 LAB — BLOOD GAS, VENOUS
Acid-Base Excess: 0.1 mmol/L (ref 0.0–2.0)
Bicarbonate: 26.5 mmol/L (ref 20.0–28.0)
O2 Saturation: 41.4 %
Patient temperature: 37
pCO2, Ven: 48 mmHg (ref 44.0–60.0)
pH, Ven: 7.35 (ref 7.250–7.430)

## 2021-01-25 ENCOUNTER — Ambulatory Visit: Payer: Medicaid Other

## 2021-02-08 ENCOUNTER — Other Ambulatory Visit: Payer: Self-pay

## 2021-02-19 ENCOUNTER — Other Ambulatory Visit: Payer: Self-pay

## 2021-02-22 ENCOUNTER — Ambulatory Visit: Payer: Medicaid Other | Admitting: Pharmacy Technician

## 2021-02-22 ENCOUNTER — Other Ambulatory Visit: Payer: Self-pay

## 2021-02-22 DIAGNOSIS — Z79899 Other long term (current) drug therapy: Secondary | ICD-10-CM

## 2021-02-22 NOTE — Progress Notes (Signed)
Completed [facility] application and contract.  Patient agreed to all terms of the [facility] contract.    Patient approved to receive medication assistance at [facility] until time for re-certification in 2023, and as long as eligibility criteria continues to be met.    Provided patient with community resource material based on his particular needs.    Roshell Brigham J. Kylan Veach Care Manager [facility]  

## 2021-03-05 ENCOUNTER — Other Ambulatory Visit: Payer: Self-pay

## 2021-04-17 ENCOUNTER — Other Ambulatory Visit: Payer: Self-pay | Admitting: Gerontology

## 2021-04-17 ENCOUNTER — Other Ambulatory Visit: Payer: Self-pay

## 2021-04-17 DIAGNOSIS — G629 Polyneuropathy, unspecified: Secondary | ICD-10-CM

## 2021-04-17 DIAGNOSIS — E1065 Type 1 diabetes mellitus with hyperglycemia: Secondary | ICD-10-CM

## 2021-04-18 ENCOUNTER — Other Ambulatory Visit: Payer: Self-pay

## 2021-04-18 ENCOUNTER — Emergency Department
Admission: EM | Admit: 2021-04-18 | Discharge: 2021-04-18 | Disposition: A | Discharge status: Home / Self Care | Payer: Medicaid Other | Attending: Emergency Medicine | Admitting: Emergency Medicine

## 2021-04-18 DIAGNOSIS — E1010 Type 1 diabetes mellitus with ketoacidosis without coma: Secondary | ICD-10-CM | POA: Insufficient documentation

## 2021-04-18 DIAGNOSIS — E1065 Type 1 diabetes mellitus with hyperglycemia: Secondary | ICD-10-CM | POA: Insufficient documentation

## 2021-04-18 DIAGNOSIS — E1040 Type 1 diabetes mellitus with diabetic neuropathy, unspecified: Secondary | ICD-10-CM | POA: Insufficient documentation

## 2021-04-18 DIAGNOSIS — Z794 Long term (current) use of insulin: Secondary | ICD-10-CM | POA: Insufficient documentation

## 2021-04-18 DIAGNOSIS — I10 Essential (primary) hypertension: Secondary | ICD-10-CM | POA: Insufficient documentation

## 2021-04-18 DIAGNOSIS — R739 Hyperglycemia, unspecified: Secondary | ICD-10-CM

## 2021-04-18 DIAGNOSIS — F17210 Nicotine dependence, cigarettes, uncomplicated: Secondary | ICD-10-CM | POA: Insufficient documentation

## 2021-04-18 LAB — COMPREHENSIVE METABOLIC PANEL
ALT: 115 U/L — ABNORMAL HIGH (ref 0–44)
AST: 112 U/L — ABNORMAL HIGH (ref 15–41)
Albumin: 3.8 g/dL (ref 3.5–5.0)
Alkaline Phosphatase: 131 U/L — ABNORMAL HIGH (ref 38–126)
Anion gap: 14 (ref 5–15)
BUN: 20 mg/dL (ref 6–20)
CO2: 25 mmol/L (ref 22–32)
Calcium: 9 mg/dL (ref 8.9–10.3)
Chloride: 91 mmol/L — ABNORMAL LOW (ref 98–111)
Creatinine, Ser: 1.2 mg/dL (ref 0.61–1.24)
GFR, Estimated: >60 mL/min (ref >60)
Glucose, Bld: 676 mg/dL (ref 70–99)
Potassium: 4 mmol/L (ref 3.5–5.1)
Sodium: 130 mmol/L — ABNORMAL LOW (ref 135–145)
Total Bilirubin: 0.7 mg/dL (ref 0.3–1.2)
Total Protein: 7 g/dL (ref 6.5–8.1)

## 2021-04-18 LAB — URINALYSIS, COMPLETE (UACMP) WITH MICROSCOPIC
Bacteria, UA: NONE SEEN
Bilirubin Urine: NEGATIVE
Glucose, UA: >=500 mg/dL — AB
Hgb urine dipstick: NEGATIVE
Ketones, ur: 5 mg/dL — AB
Leukocytes,Ua: NEGATIVE
Nitrite: NEGATIVE
Protein, ur: NEGATIVE mg/dL
Specific Gravity, Urine: 1.031 — ABNORMAL HIGH (ref 1.005–1.030)
Squamous Epithelial / HPF: NONE SEEN (ref 0–5)
pH: 6 (ref 5.0–8.0)

## 2021-04-18 LAB — CBC
HCT: 40.3 % (ref 39.0–52.0)
Hemoglobin: 14.1 g/dL (ref 13.0–17.0)
MCH: 30.8 pg (ref 26.0–34.0)
MCHC: 35 g/dL (ref 30.0–36.0)
MCV: 88 fL (ref 80.0–100.0)
Platelets: 216 10*3/uL (ref 150–400)
RBC: 4.58 MIL/uL (ref 4.22–5.81)
RDW: 13.4 % (ref 11.5–15.5)
WBC: 10.6 10*3/uL — ABNORMAL HIGH (ref 4.0–10.5)
nRBC: 0 % (ref 0.0–0.2)

## 2021-04-18 LAB — CBG MONITORING, ED
Glucose-Capillary: >600 mg/dL (ref 70–99)
Glucose-Capillary: 355 mg/dL — ABNORMAL HIGH (ref 70–99)
Glucose-Capillary: 168 mg/dL — ABNORMAL HIGH (ref 70–99)

## 2021-04-18 MED ORDER — MORPHINE SULFATE (PF) 4 MG/ML IV SOLN
4.0000 mg | Freq: Once | INTRAVENOUS | Status: AC
  Administered 2021-04-18: 4 mg via INTRAVENOUS
  Filled 2021-04-18: qty 1

## 2021-04-18 MED ORDER — INSULIN ASPART 100 UNIT/ML IJ SOLN
10.0000 [IU] | Freq: Once | INTRAMUSCULAR | Status: AC
  Administered 2021-04-18: 10 [IU] via INTRAVENOUS
  Filled 2021-04-18: qty 1

## 2021-04-18 MED ORDER — ONDANSETRON HCL 4 MG/2ML IJ SOLN
4.0000 mg | Freq: Once | INTRAMUSCULAR | Status: AC
Start: 2021-04-18 — End: 2021-04-18
  Administered 2021-04-18: 4 mg via INTRAVENOUS
  Filled 2021-04-18: qty 2

## 2021-04-18 MED ORDER — INSULIN ASPART 100 UNIT/ML IJ SOLN
8.0000 [IU] | Freq: Once | INTRAMUSCULAR | Status: AC
  Administered 2021-04-18: 8 [IU] via INTRAVENOUS
  Filled 2021-04-18: qty 1

## 2021-04-18 MED ORDER — SODIUM CHLORIDE 0.9 % IV BOLUS
1000.0000 mL | Freq: Once | INTRAVENOUS | Status: AC
  Administered 2021-04-18: 1000 mL via INTRAVENOUS

## 2021-04-18 MED ORDER — BLOOD GLUCOSE MONITOR KIT
PACK | 0 refills | Status: AC
  Filled 2021-04-18: qty 1, 30d supply, fill #0

## 2021-04-18 MED ORDER — BASAGLAR KWIKPEN 100 UNIT/ML ~~LOC~~ SOPN: 30.0000 [IU] | PEN_INJECTOR | Freq: Two times a day (BID) | SUBCUTANEOUS | 0 refills | Status: DC

## 2021-04-18 NOTE — ED Triage Notes (Signed)
Pt to ED for "not feeling good", states feels like sugar is high, ran out of one insulin and has been "winging it". CBG reads HI  +n/v Took short acting insulin PTA 15 units

## 2021-04-18 NOTE — ED Notes (Signed)
Green top drawn by IV team on placement hemolyzed. Lab called for phlebotomy collect at this time.

## 2021-04-18 NOTE — ED Provider Notes (Signed)
[facility] Emergency Department Provider Note  Time seen: 1:50 PM  I have reviewed the triage vital signs and the nursing notes.   HISTORY  Chief Complaint Hyperglycemia   HPI Trevor Brown is a 30 y.o. male with a past medical history of hepatitis, substance abuse, diabetes, DKA, presents to the emergency department for elevated blood sugar nausea vomiting abdominal discomfort.  According to the patient he typically takes NovoLog and Lantus for diabetes but ran out of Lantus 2 days ago.  Patient states today he has been very nauseated with frequent episodes of vomiting, feels dehydrated with abdominal discomfort/cramping.  Patient denies any recent fever cough or congestion.  No diarrhea.   Past Medical History:  Diagnosis Date   Heart attack (HCC)    Pt claims he had heart attack a year ago ( 2015)- and was admitted in [facility] for that, but not given any meds or angiogram, on review of chart- I could not find any details like that.   Hepatitis C, acute may 2016   Heroin abuse (HCC)    History of noncompliance with medical treatment    Hypertension    Type 1 diabetes (HCC)     Patient Active Problem List   Diagnosis Date Noted   HTN (hypertension) 01/05/2021   GERD (gastroesophageal reflux disease) 01/05/2021   SIRS (systemic inflammatory response syndrome) (HCC) 03/03/2020   Cellulitis of left hand 03/03/2020   Elevated blood pressure reading 01/18/2020   Hypokalemia    Sepsis without acute organ dysfunction (HCC)    Dental abscess 09/12/2019   Intractable vomiting    Hematemesis with nausea    Cannabis hyperemesis syndrome concurrent with and due to cannabis abuse (HCC)    Elevated LFTs    Abnormal laboratory test result 07/20/2019   Smoking 07/20/2019   History of hepatitis C 02/25/2019   Health care maintenance 02/25/2019   Peripheral neuropathy 02/25/2019   Hand abscess 10/13/2018   IVDU (intravenous drug user) 10/11/2018   Hyperkalemia  10/11/2018   Cellulitis and abscess of hand 08/17/2018   Polysubstance abuse (HCC) 01/20/2018   Abdominal pain 06/29/2017   Hyponatremia 06/29/2017   DKA (diabetic ketoacidosis) (HCC) 06/29/2017   Dental erosion extending into pulp 12/31/2016   MRSA carrier 07/05/2016   Elevated transaminase level 03/27/2016   Diabetes mellitus type 1 (HCC) 03/27/2016   Tobacco abuse counseling 03/27/2016   Transaminitis 03/25/2016   Leukocytosis 01/13/2016   Cannabinoid hyperemesis syndrome 01/10/2016   Tobacco abuse 01/10/2016   Type 1 diabetes mellitus with hyperglycemia (HCC) 12/12/2015   Chest pain 12/12/2015   Cocaine abuse (HCC) 12/12/2015   DKA (diabetic ketoacidoses) 06/10/2015   Hidradenitis suppurativa of left axilla    DKA, type 1 (HCC) 06/06/2015   Malnutrition of moderate degree (HCC) 04/08/2015   Hepatitis C 12/13/2014   Diabetes type 1, uncontrolled (HCC) 12/11/2014   Major depressive disorder, single episode, mild (HCC)    Major depression, single episode 12/10/2014   Diabetes mellitus type 1, uncontrolled (HCC) 12/09/2014   Homelessness 12/09/2014    Past Surgical History:  Procedure Laterality Date   INCISION AND DRAINAGE ABSCESS Left 03/04/2020   Procedure: INCISION AND DRAINAGE ABSCESS;  Surgeon: Christena Flake, MD;  Location: [facility] ORS;  Service: Orthopedics;  Laterality: Left;   NO PAST SURGERIES     none      Prior to Admission medications   Medication Sig Start Date End Date Taking? Authorizing Provider  gabapentin (NEURONTIN) 400 MG capsule Take 1  capsule (400 mg total) by mouth 3 (three) times daily for 14 days. 01/08/21 01/22/21  Lolita Patella B, MD  glucose blood (FREESTYLE LITE) test strip To be used as directed for checking her blood sugar 01/08/21   Lolita Patella B, MD  glucose blood (RIGHTEST [phone] BLOOD GLUCOSE) test strip USE AS DIRECTED. 01/08/21   Skipper Cliche, [facility]  Insulin Glargine (BASAGLAR KWIKPEN) 100 UNIT/ML Inject 30 Units into the skin 2 (two)  times daily. 01/08/21   Sreenath, Sudheer B, MD  insulin lispro (HUMALOG) 100 UNIT/ML KwikPen INJECT 5-10 UNITS INTO THE SKIN BEFORE MEALS PER SLIDING SCALE. IF BLOOD SUGAR IS 120-250=5 UNITS;251-350=8 UNITS; OVER 350=10 UNITS 01/08/21 01/08/22  Lolita Patella B, MD  Insulin Pen Needle 32G X 4 MM MISC USE AS DIRECTED WITH INSULIN PENS. 01/08/21   Skipper Cliche, [facility]  Rightest GL300 Lancets MISC USE AS DIRECTED. 01/08/21   Skipper Cliche, [facility]    Allergies  Allergen Reactions   Bee Venom Anaphylaxis   Ibuprofen Itching, Rash and Other (See Comments)    Other reaction(s): Other (See Comments) Stomach upset Reaction:  GI upset    Tramadol Hives and Swelling   Vancomycin Rash and Other (See Comments)    Reaction:  Red man's syndrome     Family History  Problem Relation Age of Onset   Cirrhosis Mother    Diabetes Mellitus II Maternal Grandmother    Diabetes Mellitus II Maternal Grandfather    CAD Father     Social History Social History   Tobacco Use   Smoking status: Every Day    Packs/day: 2.00    Types: Cigarettes   Smokeless tobacco: Never  Vaping Use   Vaping Use: Never used  Substance Use Topics   Alcohol use: No    Alcohol/week: 0.0 standard drinks   Drug use: Not Currently    Types: IV, Heroin, Marijuana    Comment: heroin-     Review of Systems Constitutional: Negative for fever. Respiratory: Negative for shortness of breath. Gastrointestinal: Positive for abdominal cramping, nausea and vomiting. Genitourinary: Negative for urinary compaints Musculoskeletal: Negative for musculoskeletal complaints Neurological: Negative for headache All other ROS negative  ____________________________________________   PHYSICAL EXAM:  VITAL SIGNS: ED Triage Vitals  Enc Vitals Group     BP 04/18/21 1248 125/84     Pulse Rate 04/18/21 1248 90     Resp 04/18/21 1248 18     Temp 04/18/21 1248 97.7 F (36.5 C)     Temp Source 04/18/21 1248 Oral     SpO2 04/18/21 1248  100 %     Weight 04/18/21 1249 120 lb (54.4 kg)     Height 04/18/21 1249 5\' 4"  (1.626 m)     Head Circumference --      Peak Flow --      Pain Score 04/18/21 1249 0     Pain Loc --      Pain Edu? --      Excl. in GC? --    Constitutional: Alert and oriented. Well appearing and in no distress. Eyes: Normal exam ENT      Head: Normocephalic and atraumatic.      Mouth/Throat: Mucous membranes are moist. Cardiovascular: Normal rate, regular rhythm.  Respiratory: Normal respiratory effort without tachypnea nor retractions. Breath sounds are clear  Gastrointestinal: Soft, mild tenderness to palpation diffusely without focal tenderness identified.  No rebound guarding or distention. Musculoskeletal: Nontender with normal range of motion in all extremities.  Neurologic:  Normal speech and language. No gross focal neurologic deficits  Skin:  Skin is warm, dry and intact.  Psychiatric: Mood and affect are normal.   ____________________________________________   INITIAL IMPRESSION / ASSESSMENT AND PLAN / ED COURSE  Pertinent labs & imaging results that were available during my care of the patient were reviewed by me and considered in my medical decision making (see chart for details).   Patient presents emergency department for an elevated blood glucose, CBG greater than 600 along with nausea vomiting abdominal cramping.  Concern would be for hyperglycemia versus DKA.  We will check labs including a VBG, IV hydrate and treat nausea while awaiting results.  Depending on results we will decide upon treatment course at that time.  Patient's VBG have resulted with a pH of 7.32.  Patient's blood sugars greater than 600 however after fluids and insulin and has come down to now 168.  Patient did not have an anion gap.  Patient states he is feeling better.  We will discharge the patient home with prescriptions for his insulin as well as a new glucose meter as he states his recently broke.  I discussed  this plan of care with the patient who is agreeable.    WACO FOERSTER was evaluated in Emergency Department on 04/18/2021 for the symptoms described in the history of present illness. He was evaluated in the context of the global COVID-19 pandemic, which necessitated consideration that the patient might be at risk for infection with the SARS-CoV-2 virus that causes COVID-19. Institutional protocols and algorithms that pertain to the evaluation of patients at risk for COVID-19 are in a state of rapid change based on information released by regulatory bodies including the CDC and federal and state organizations. These policies and algorithms were followed during the patient's care in the ED.  ____________________________________________   FINAL CLINICAL IMPRESSION(S) / ED DIAGNOSES  Hyperglycemia   Minna Antis, MD 04/18/21 1840

## 2021-04-18 NOTE — ED Notes (Signed)
Multiple RN attempt and Darl Pikes PA attempt for IV access and blood draw. Unsuccessful. IV team consult placed

## 2021-04-18 NOTE — ED Notes (Signed)
Penni Bombard, RN made aware of pt CBG reading "hi."

## 2021-04-18 NOTE — ED Provider Notes (Signed)
Emergency Medicine Provider Triage Evaluation Note  Trevor Brown , a 30 y.o. male  was evaluated in triage.  Pt complains of not feeling well, feels like his sugar is high.  Ran out of 1 type of his insulin and has been trying to just use the other 1.  States he feels nauseated..  Review of Systems  Positive: Nausea, weakness Negative: Denies chest pain, shortness of breath, abdominal pain  Physical Exam  BP 125/84   Pulse 90   Temp 97.7 F (36.5 C) (Oral)   Resp 18   Ht 5\' 4"  (1.626 m)   Wt 54.4 kg   SpO2 100%   BMI 20.60 kg/m  Gen:   Awake, no distress   Resp:  Normal effort  MSK:   Moves extremities without difficulty  Other:    Medical Decision Making  Medically screening exam initiated at 1:08 PM.  Appropriate orders placed.  PISTOL KESSENICH was informed that the remainder of the evaluation will be completed by another provider, this initial triage assessment does not replace that evaluation, and the importance of remaining in the ED until their evaluation is complete.  Patient in DKA.  Nursing staff attempting IVs to get fluids   [name], PA-C 04/18/21 1309    04/20/21, MD 04/18/21 [phone]

## 2021-04-19 ENCOUNTER — Inpatient Hospital Stay
Admission: EM | Admit: 2021-04-19 | Discharge: 2021-04-21 | DRG: 638 | Disposition: A | Discharge status: Home / Self Care | Payer: Self-pay | Attending: Internal Medicine | Admitting: Internal Medicine

## 2021-04-19 ENCOUNTER — Emergency Department: Payer: Self-pay

## 2021-04-19 ENCOUNTER — Ambulatory Visit: Payer: Medicaid Other | Admitting: Gerontology

## 2021-04-19 ENCOUNTER — Other Ambulatory Visit: Payer: Self-pay

## 2021-04-19 DIAGNOSIS — Z20822 Contact with and (suspected) exposure to covid-19: Secondary | ICD-10-CM | POA: Diagnosis present

## 2021-04-19 DIAGNOSIS — Z9114 Patient's other noncompliance with medication regimen: Secondary | ICD-10-CM

## 2021-04-19 DIAGNOSIS — Z833 Family history of diabetes mellitus: Secondary | ICD-10-CM

## 2021-04-19 DIAGNOSIS — Z91148 Patient's other noncompliance with medication regimen for other reason: Secondary | ICD-10-CM

## 2021-04-19 DIAGNOSIS — E1110 Type 2 diabetes mellitus with ketoacidosis without coma: Secondary | ICD-10-CM | POA: Diagnosis present

## 2021-04-19 DIAGNOSIS — R7989 Other specified abnormal findings of blood chemistry: Secondary | ICD-10-CM | POA: Diagnosis present

## 2021-04-19 DIAGNOSIS — E876 Hypokalemia: Secondary | ICD-10-CM | POA: Diagnosis present

## 2021-04-19 DIAGNOSIS — E1065 Type 1 diabetes mellitus with hyperglycemia: Principal | ICD-10-CM | POA: Diagnosis present

## 2021-04-19 DIAGNOSIS — K529 Noninfective gastroenteritis and colitis, unspecified: Secondary | ICD-10-CM

## 2021-04-19 DIAGNOSIS — F17210 Nicotine dependence, cigarettes, uncomplicated: Secondary | ICD-10-CM | POA: Diagnosis present

## 2021-04-19 DIAGNOSIS — Z794 Long term (current) use of insulin: Secondary | ICD-10-CM

## 2021-04-19 DIAGNOSIS — Z8249 Family history of ischemic heart disease and other diseases of the circulatory system: Secondary | ICD-10-CM

## 2021-04-19 DIAGNOSIS — E871 Hypo-osmolality and hyponatremia: Secondary | ICD-10-CM | POA: Diagnosis present

## 2021-04-19 DIAGNOSIS — B1920 Unspecified viral hepatitis C without hepatic coma: Secondary | ICD-10-CM | POA: Diagnosis present

## 2021-04-19 DIAGNOSIS — E1069 Type 1 diabetes mellitus with other specified complication: Secondary | ICD-10-CM

## 2021-04-19 DIAGNOSIS — R1011 Right upper quadrant pain: Secondary | ICD-10-CM

## 2021-04-19 DIAGNOSIS — D72828 Other elevated white blood cell count: Secondary | ICD-10-CM | POA: Diagnosis present

## 2021-04-19 DIAGNOSIS — E1042 Type 1 diabetes mellitus with diabetic polyneuropathy: Secondary | ICD-10-CM | POA: Diagnosis present

## 2021-04-19 DIAGNOSIS — I10 Essential (primary) hypertension: Secondary | ICD-10-CM | POA: Diagnosis present

## 2021-04-19 DIAGNOSIS — IMO0002 Reserved for concepts with insufficient information to code with codable children: Secondary | ICD-10-CM | POA: Diagnosis present

## 2021-04-19 DIAGNOSIS — K219 Gastro-esophageal reflux disease without esophagitis: Secondary | ICD-10-CM | POA: Diagnosis present

## 2021-04-19 DIAGNOSIS — E1010 Type 1 diabetes mellitus with ketoacidosis without coma: Secondary | ICD-10-CM | POA: Diagnosis present

## 2021-04-19 DIAGNOSIS — Z8619 Personal history of other infectious and parasitic diseases: Secondary | ICD-10-CM | POA: Diagnosis present

## 2021-04-19 LAB — CBC WITH DIFFERENTIAL/PLATELET
Abs Immature Granulocytes: 0.08 10*3/uL — ABNORMAL HIGH (ref 0.00–0.07)
Basophils Absolute: 0.1 10*3/uL (ref 0.0–0.1)
Basophils Relative: 0 %
Eosinophils Absolute: 0 10*3/uL (ref 0.0–0.5)
Eosinophils Relative: 0 %
HCT: 40 % (ref 39.0–52.0)
Hemoglobin: 13.5 g/dL (ref 13.0–17.0)
Immature Granulocytes: 1 %
Lymphocytes Relative: 9 %
Lymphs Abs: 1.5 10*3/uL (ref 0.7–4.0)
MCH: 29.2 pg (ref 26.0–34.0)
MCHC: 33.8 g/dL (ref 30.0–36.0)
MCV: 86.6 fL (ref 80.0–100.0)
Monocytes Absolute: 0.6 10*3/uL (ref 0.1–1.0)
Monocytes Relative: 4 %
Neutro Abs: 14.2 10*3/uL — ABNORMAL HIGH (ref 1.7–7.7)
Neutrophils Relative %: 86 %
Platelets: 245 10*3/uL (ref 150–400)
RBC: 4.62 MIL/uL (ref 4.22–5.81)
RDW: 13.3 % (ref 11.5–15.5)
WBC: 16.4 10*3/uL — ABNORMAL HIGH (ref 4.0–10.5)
nRBC: 0 % (ref 0.0–0.2)

## 2021-04-19 LAB — URINALYSIS, ROUTINE W REFLEX MICROSCOPIC
Bilirubin Urine: NEGATIVE
Glucose, UA: 500 mg/dL — AB
Hgb urine dipstick: NEGATIVE
Ketones, ur: >160 mg/dL — AB
Leukocytes,Ua: NEGATIVE
Nitrite: NEGATIVE
Protein, ur: NEGATIVE mg/dL
Specific Gravity, Urine: 1.02 (ref 1.005–1.030)
pH: 5.5 (ref 5.0–8.0)

## 2021-04-19 LAB — BASIC METABOLIC PANEL
Anion gap: 11 (ref 5–15)
Anion gap: 6 (ref 5–15)
Anion gap: 7 (ref 5–15)
BUN: 20 mg/dL (ref 6–20)
BUN: 22 mg/dL — ABNORMAL HIGH (ref 6–20)
BUN: 23 mg/dL — ABNORMAL HIGH (ref 6–20)
CO2: 24 mmol/L (ref 22–32)
CO2: 27 mmol/L (ref 22–32)
CO2: 27 mmol/L (ref 22–32)
Calcium: 8.4 mg/dL — ABNORMAL LOW (ref 8.9–10.3)
Calcium: 8.2 mg/dL — ABNORMAL LOW (ref 8.9–10.3)
Calcium: 8.2 mg/dL — ABNORMAL LOW (ref 8.9–10.3)
Chloride: 101 mmol/L (ref 98–111)
Chloride: 102 mmol/L (ref 98–111)
Chloride: 99 mmol/L (ref 98–111)
Creatinine, Ser: 1.03 mg/dL (ref 0.61–1.24)
Creatinine, Ser: 0.95 mg/dL (ref 0.61–1.24)
Creatinine, Ser: 1 mg/dL (ref 0.61–1.24)
GFR, Estimated: >60 mL/min (ref >60)
GFR, Estimated: >60 mL/min (ref >60)
GFR, Estimated: >60 mL/min (ref >60)
Glucose, Bld: 288 mg/dL — ABNORMAL HIGH (ref 70–99)
Glucose, Bld: 168 mg/dL — ABNORMAL HIGH (ref 70–99)
Glucose, Bld: 285 mg/dL — ABNORMAL HIGH (ref 70–99)
Potassium: 3.7 mmol/L (ref 3.5–5.1)
Potassium: 3.7 mmol/L (ref 3.5–5.1)
Potassium: 4 mmol/L (ref 3.5–5.1)
Sodium: 136 mmol/L (ref 135–145)
Sodium: 135 mmol/L (ref 135–145)
Sodium: 133 mmol/L — ABNORMAL LOW (ref 135–145)

## 2021-04-19 LAB — COMPREHENSIVE METABOLIC PANEL
ALT: 113 U/L — ABNORMAL HIGH (ref 0–44)
AST: 91 U/L — ABNORMAL HIGH (ref 15–41)
Albumin: 3.9 g/dL (ref 3.5–5.0)
Alkaline Phosphatase: 103 U/L (ref 38–126)
Anion gap: 17 — ABNORMAL HIGH (ref 5–15)
BUN: 21 mg/dL — ABNORMAL HIGH (ref 6–20)
CO2: 21 mmol/L — ABNORMAL LOW (ref 22–32)
Calcium: 9.2 mg/dL (ref 8.9–10.3)
Chloride: 99 mmol/L (ref 98–111)
Creatinine, Ser: 1.19 mg/dL (ref 0.61–1.24)
GFR, Estimated: >60 mL/min (ref >60)
Glucose, Bld: 331 mg/dL — ABNORMAL HIGH (ref 70–99)
Potassium: 3.9 mmol/L (ref 3.5–5.1)
Sodium: 137 mmol/L (ref 135–145)
Total Bilirubin: 1.3 mg/dL — ABNORMAL HIGH (ref 0.3–1.2)
Total Protein: 6.9 g/dL (ref 6.5–8.1)

## 2021-04-19 LAB — CBG MONITORING, ED
Glucose-Capillary: 503 mg/dL (ref 70–99)
Glucose-Capillary: 332 mg/dL — ABNORMAL HIGH (ref 70–99)
Glucose-Capillary: 308 mg/dL — ABNORMAL HIGH (ref 70–99)
Glucose-Capillary: 179 mg/dL — ABNORMAL HIGH (ref 70–99)
Glucose-Capillary: 152 mg/dL — ABNORMAL HIGH (ref 70–99)
Glucose-Capillary: 268 mg/dL — ABNORMAL HIGH (ref 70–99)
Glucose-Capillary: 156 mg/dL — ABNORMAL HIGH (ref 70–99)
Glucose-Capillary: 251 mg/dL — ABNORMAL HIGH (ref 70–99)
Glucose-Capillary: 259 mg/dL — ABNORMAL HIGH (ref 70–99)
Glucose-Capillary: 155 mg/dL — ABNORMAL HIGH (ref 70–99)

## 2021-04-19 LAB — BLOOD GAS, VENOUS
Acid-base deficit: 0.7 mmol/L (ref 0.0–2.0)
Bicarbonate: 24.2 mmol/L (ref 20.0–28.0)
O2 Saturation: 75.7 %
Patient temperature: 37
pCO2, Ven: 40 mmHg — ABNORMAL LOW (ref 44.0–60.0)
pH, Ven: 7.39 (ref 7.250–7.430)
pO2, Ven: 41 mmHg (ref 32.0–45.0)

## 2021-04-19 LAB — BETA-HYDROXYBUTYRIC ACID
Beta-Hydroxybutyric Acid: 2.97 mmol/L — ABNORMAL HIGH (ref 0.05–0.27)
Beta-Hydroxybutyric Acid: 0.1 mmol/L (ref 0.05–0.27)

## 2021-04-19 LAB — RESP PANEL BY RT-PCR (FLU A&B, COVID) ARPGX2
Influenza A by PCR: NEGATIVE
Influenza B by PCR: NEGATIVE
SARS Coronavirus 2 by RT PCR: NEGATIVE

## 2021-04-19 MED ORDER — LACTATED RINGERS IV SOLN: INTRAVENOUS | Status: DC

## 2021-04-19 MED ORDER — RIGHTEST GL300 LANCETS MISC
99 refills | Status: AC
  Filled 2021-04-19: qty 100, 25d supply, fill #0
  Filled 2021-06-01: qty 100, 25d supply, fill #1
  Filled 2021-09-11: qty 100, 25d supply, fill #2

## 2021-04-19 MED ORDER — METHOCARBAMOL 750 MG PO TABS
750.0000 mg | ORAL_TABLET | Freq: Once | ORAL | Status: AC
  Administered 2021-04-19: 750 mg via ORAL
  Filled 2021-04-19: qty 1

## 2021-04-19 MED ORDER — LACTATED RINGERS IV BOLUS
1000.0000 mL | Freq: Once | INTRAVENOUS | Status: AC
  Administered 2021-04-19: 1000 mL via INTRAVENOUS

## 2021-04-19 MED ORDER — RIGHTEST GS550 BLOOD GLUCOSE VI STRP
ORAL_STRIP | 99 refills | Status: AC
  Filled 2021-04-19: qty 100, 25d supply, fill #0
  Filled 2021-06-01: qty 100, 25d supply, fill #1
  Filled 2021-09-11: qty 50, 13d supply, fill #2

## 2021-04-19 MED ORDER — DEXTROSE 50 % IV SOLN: 0.0000 mL | INTRAVENOUS | Status: DC | PRN

## 2021-04-19 MED ORDER — LACTATED RINGERS IV BOLUS
20.0000 mL/kg | Freq: Once | INTRAVENOUS | Status: AC
  Administered 2021-04-19: 1088 mL via INTRAVENOUS

## 2021-04-19 MED ORDER — INSULIN ASPART 100 UNIT/ML IJ SOLN
0.0000 [IU] | Freq: Three times a day (TID) | INTRAMUSCULAR | Status: DC
  Administered 2021-04-19: 4 [IU] via SUBCUTANEOUS
  Administered 2021-04-20: 7 [IU] via SUBCUTANEOUS
  Administered 2021-04-20: 20 [IU] via SUBCUTANEOUS
  Administered 2021-04-20 – 2021-04-21 (×3): 11 [IU] via SUBCUTANEOUS
  Administered 2021-04-21: 7 [IU] via SUBCUTANEOUS
  Administered 2021-04-21: 11 [IU] via SUBCUTANEOUS
  Filled 2021-04-19 (×8): qty 1

## 2021-04-19 MED ORDER — SODIUM CHLORIDE 0.9 % IV SOLN: INTRAVENOUS | Status: DC

## 2021-04-19 MED ORDER — NICOTINE 21 MG/24HR TD PT24
21.0000 mg | MEDICATED_PATCH | Freq: Every day | TRANSDERMAL | Status: DC
  Administered 2021-04-19 – 2021-04-21 (×3): 21 mg via TRANSDERMAL
  Filled 2021-04-19 (×3): qty 1

## 2021-04-19 MED ORDER — POTASSIUM CHLORIDE 10 MEQ/100ML IV SOLN
10.0000 meq | INTRAVENOUS | Status: AC
Start: 2021-04-19 — End: 2021-04-19
  Administered 2021-04-19 (×2): 10 meq via INTRAVENOUS
  Filled 2021-04-19 (×2): qty 100

## 2021-04-19 MED ORDER — DEXTROSE IN LACTATED RINGERS 5 % IV SOLN: INTRAVENOUS | Status: DC

## 2021-04-19 MED ORDER — METOCLOPRAMIDE HCL 5 MG/ML IJ SOLN
10.0000 mg | Freq: Three times a day (TID) | INTRAMUSCULAR | Status: AC
  Administered 2021-04-19 – 2021-04-20 (×3): 10 mg via INTRAVENOUS
  Filled 2021-04-19 (×3): qty 2

## 2021-04-19 MED ORDER — ENOXAPARIN SODIUM 40 MG/0.4ML IJ SOSY
40.0000 mg | PREFILLED_SYRINGE | INTRAMUSCULAR | Status: DC
  Filled 2021-04-19 (×3): qty 0.4

## 2021-04-19 MED ORDER — INSULIN REGULAR(HUMAN) IN NACL 100-0.9 UT/100ML-% IV SOLN: INTRAVENOUS | Status: DC

## 2021-04-19 MED ORDER — PANTOPRAZOLE SODIUM 40 MG IV SOLR
40.0000 mg | Freq: Every day | INTRAVENOUS | Status: DC
  Administered 2021-04-19 – 2021-04-21 (×3): 40 mg via INTRAVENOUS
  Filled 2021-04-19 (×3): qty 40

## 2021-04-19 MED ORDER — MORPHINE SULFATE (PF) 2 MG/ML IV SOLN
2.0000 mg | INTRAVENOUS | Status: DC | PRN
Start: 2021-04-19 — End: 2021-04-19

## 2021-04-19 MED ORDER — INSULIN GLARGINE-YFGN 100 UNIT/ML ~~LOC~~ SOLN
20.0000 [IU] | SUBCUTANEOUS | Status: DC
  Administered 2021-04-19: 20 [IU] via SUBCUTANEOUS
  Filled 2021-04-19 (×2): qty 0.2

## 2021-04-19 MED ORDER — POTASSIUM CHLORIDE 10 MEQ/100ML IV SOLN
10.0000 meq | INTRAVENOUS | Status: AC
  Administered 2021-04-19 (×2): 10 meq via INTRAVENOUS
  Filled 2021-04-19 (×2): qty 100

## 2021-04-19 MED ORDER — HALOPERIDOL LACTATE 5 MG/ML IJ SOLN
2.5000 mg | Freq: Once | INTRAMUSCULAR | Status: AC
  Administered 2021-04-19: 2.5 mg via INTRAVENOUS
  Filled 2021-04-19: qty 1

## 2021-04-19 MED ORDER — INSULIN REGULAR(HUMAN) IN NACL 100-0.9 UT/100ML-% IV SOLN
INTRAVENOUS | Status: DC
  Administered 2021-04-19: 8 [IU]/h via INTRAVENOUS
  Filled 2021-04-19: qty 100

## 2021-04-19 MED FILL — Gabapentin Cap 400 MG: ORAL | 30 days supply | Qty: 90 | Fill #0 | Status: AC

## 2021-04-19 MED FILL — Insulin Lispro Soln Pen-injector 100 Unit/ML (1 Unit Dial): SUBCUTANEOUS | Qty: 15 | Fill #0 | Status: CN

## 2021-04-19 MED FILL — Insulin Lispro Soln Pen-injector 100 Unit/ML (1 Unit Dial): SUBCUTANEOUS | 50 days supply | Qty: 15 | Fill #0 | Status: AC

## 2021-04-19 NOTE — ED Notes (Signed)
Lab at bedside for blood draw.

## 2021-04-19 NOTE — ED Notes (Signed)
CBG 268

## 2021-04-19 NOTE — ED Notes (Signed)
ED tech at bedside to attempt lab draw.

## 2021-04-19 NOTE — H&P (Signed)
History and Physical    Trevor Brown DOB: [birth year] DOA: 04/19/2021  PCP: Langston Reusing, NP   Patient coming from: Home  I have personally briefly reviewed patient's old medical records in [place]  Chief Complaint: Nausea/vomiting  HPI: Trevor Brown is a 30 y.o. male with medical history significant for insulin-dependent diabetes mellitus, nicotine dependence, hepatitis C, medication noncompliance who presents to the emergency room for the second time in 24 hours for evaluation of feeling unwell. Patient complains of nausea and episodes of emesis as well as periumbilical abdominal pain.  He was seen in the emergency room 1 day prior to his admission and had significant hyperglycemia.  He was treated with IV fluid hydration and received insulin and was discharged home with improvement in his symptoms. Patient states that he ran out of his Lantus and was unable to go pick up his medications this morning when he developed nausea and vomiting as well as abdominal pain and so he ended up back in the emergency room. He denies having any chest pain, no shortness of breath, no dizziness, no lightheadedness, no palpitations, no changes in his bowel habits, no fever, no chills, no cough, no urinary symptoms, no headache, no blurred vision no focal deficit. Venous blood gas 7.39/40/41/24.2/75.7 Sodium 137, potassium 3.9, chloride 99, bicarb 21, glucose 331, BUN 21, creatinine 1.19, calcium 9.2, anion gap 17, alkaline phosphatase 103, albumin 3.9, AST 91, ALT 113, total protein 6.9, white count 16.4, hemoglobin 13.5, hematocrit 40.0, MCV 86, RDW 13.3, platelet count 245, beta hydroxy 2.97 Abdominal ultrasound is negative for gallstones or biliary dilatation EKG reviewed by me shows sinus rhythm with short PR interval   ED Course: Patient is a 30 year old male with a history of insulin-dependent diabetes mellitus and medication noncompliance who presents to the emergency  room for the second time in 24 for evaluation of nausea, vomiting and abdominal pain. Patient has missed a couple of doses of his Lantus and is in DKA. He will be admitted to the hospital for further evaluation.   Review of Systems: As per HPI otherwise all other systems reviewed and negative.    Past Medical History:  Diagnosis Date   Heart attack ([place])    Pt claims he had heart attack a year ago ( 2015)- and was admitted in [facility] for that, but not given any meds or angiogram, on review of chart- I could not find any details like that.   Hepatitis C, acute may 2016   Heroin abuse ([place])    History of noncompliance with medical treatment    Hypertension    Type 1 diabetes [facility])     Past Surgical History:  Procedure Laterality Date   INCISION AND DRAINAGE ABSCESS Left 03/04/2020   Procedure: INCISION AND DRAINAGE ABSCESS;  Surgeon: Corky Mull, MD;  Location: [facility] ORS;  Service: Orthopedics;  Laterality: Left;   NO PAST SURGERIES     none       reports that he has been smoking cigarettes. He has been smoking an average of 2 packs per day. He has never used smokeless tobacco. He reports that he does not currently use drugs after having used the following drugs: IV, Heroin, and Marijuana. He reports that he does not drink alcohol.  Allergies  Allergen Reactions   Bee Venom Anaphylaxis   Ibuprofen Itching, Rash and Other (See Comments)    Other reaction(s): Other (See Comments) Stomach upset Reaction:  GI upset  Tramadol Hives and Swelling   Vancomycin Rash and Other (See Comments)    Reaction:  Red man's syndrome     Family History  Problem Relation Age of Onset   Cirrhosis Mother    Diabetes Mellitus II Maternal Grandmother    Diabetes Mellitus II Maternal Grandfather    CAD Father       Prior to Admission medications   Medication Sig Start Date End Date Taking? Authorizing Provider  Insulin Glargine (BASAGLAR KWIKPEN) 100 UNIT/ML Inject 30 Units into the skin 2  (two) times daily. 01/08/21  Yes Sreenath, Sudheer B, MD  insulin lispro (HUMALOG KWIKPEN) 100 UNIT/ML KwikPen INJECT 5-10 UNITS INTO THE SKIN 3 TIMES DAILY BEFORE MEALS PER SLIDING SCALE. IF BLOOD SUGAR IS 120-250=5 UNITS;251-350=8 UNITS; OVER 350=10 UNITS 04/19/21  Yes Iloabachie, [name], NP  blood glucose meter kit and supplies KIT USE AS DIRECTED UP TO 4 TIMES DAILY. 04/18/21   Harvest Dark, MD  gabapentin (NEURONTIN) 400 MG capsule Take 1 capsule (400 mg total) by mouth 3 (three) times daily. Patient not taking: Reported on 04/19/2021 04/19/21 05/19/21  Iloabachie, [name], NP  glucose blood (FREESTYLE LITE) test strip To be used as directed for checking her blood sugar 01/08/21   Sreenath, Sudheer B, MD  glucose blood (RIGHTEST GS550 BLOOD GLUCOSE) test strip USE AS DIRECTED UP TO 4 TIMES DAILY. 04/19/21   Cammy Copa, RPH  Insulin Pen Needle 32G X 4 MM MISC USE AS DIRECTED WITH INSULIN PENS. 01/08/21   Ellie Lunch K, RPH  Rightest GL300 Lancets MISC USE AS DIRECTED UP TO 4 TIMES DAILY. 04/19/21   Cammy Copa, [facility]    Physical Exam: Vitals:   04/19/21 0750 04/19/21 0751 04/19/21 1000 04/19/21 1030  BP: 136/81     Pulse: 92  92 71  Resp: 16  17 10   Temp: 98.3 F (36.8 C)     TempSrc: Oral     SpO2: 100%  100% 97%  Weight:  54.4 kg    Height:  5' 4"  (1.626 m)       Vitals:   04/19/21 0750 04/19/21 0751 04/19/21 1000 04/19/21 1030  BP: 136/81     Pulse: 92  92 71  Resp: 16  17 10   Temp: 98.3 F (36.8 C)     TempSrc: Oral     SpO2: 100%  100% 97%  Weight:  54.4 kg    Height:  5' 4"  (1.626 m)        Constitutional: Alert and oriented x 3 . Not in any apparent distress HEENT:      Head: Normocephalic and atraumatic.         Eyes: PERLA, EOMI, Conjunctivae are normal. Sclera is non-icteric.       Mouth/Throat: Mucous membranes are dry.       Neck: Supple with no signs of meningismus. Cardiovascular: Regular rate and rhythm. No murmurs, gallops, or rubs. 2+  symmetrical distal pulses are present . No JVD. No LE edema Respiratory: Respiratory effort normal .Lungs sounds clear bilaterally. No wheezes, crackles, or rhonchi.  Gastrointestinal: Soft, periumbilical tenderness, and non distended with positive bowel sounds.  Genitourinary: No CVA tenderness. Musculoskeletal: Nontender with normal range of motion in all extremities. No cyanosis, or erythema of extremities. Neurologic:  Face is symmetric. Moving all extremities. No gross focal neurologic deficits . Skin: Skin is warm, dry.  No rash or ulcers Psychiatric: Mood and affect are normal    Labs on Admission: I  have personally reviewed following labs and imaging studies  CBC: Recent Labs  Lab 04/18/21 1401 04/19/21 0931  WBC 10.6* 16.4*  NEUTROABS  --  14.2*  HGB 14.1 13.5  HCT 40.3 40.0  MCV 88.0 86.6  PLT 216 182   Basic Metabolic Panel: Recent Labs  Lab 04/18/21 1453 04/19/21 0931  NA 130* 137  K 4.0 3.9  CL 91* 99  CO2 25 21*  GLUCOSE 676* 331*  BUN 20 21*  CREATININE 1.20 1.19  CALCIUM 9.0 9.2   GFR: Estimated Creatinine Clearance: 71.1 mL/min (by C-G formula based on SCr of 1.19 mg/dL). Liver Function Tests: Recent Labs  Lab 04/18/21 1453 04/19/21 0931  AST 112* 91*  ALT 115* 113*  ALKPHOS 131* 103  BILITOT 0.7 1.3*  PROT 7.0 6.9  ALBUMIN 3.8 3.9   No results for input(s): LIPASE, AMYLASE in the last 168 hours. No results for input(s): AMMONIA in the last 168 hours. Coagulation Profile: No results for input(s): INR, PROTIME in the last 168 hours. Cardiac Enzymes: No results for input(s): CKTOTAL, CKMB, CKMBINDEX, TROPONINI in the last 168 hours. BNP (last 3 results) No results for input(s): PROBNP in the last 8760 hours. HbA1C: No results for input(s): HGBA1C in the last 72 hours. CBG: Recent Labs  Lab 04/18/21 1248 04/18/21 1635 04/18/21 1820 04/19/21 0748 04/19/21 1122  GLUCAP >600* 355* 168* 503* 332*   Lipid Profile: No results for  input(s): CHOL, HDL, LDLCALC, TRIG, CHOLHDL, LDLDIRECT in the last 72 hours. Thyroid Function Tests: No results for input(s): TSH, T4TOTAL, FREET4, T3FREE, THYROIDAB in the last 72 hours. Anemia Panel: No results for input(s): VITAMINB12, FOLATE, FERRITIN, TIBC, IRON, RETICCTPCT in the last 72 hours. Urine analysis:    Component Value Date/Time   COLORURINE YELLOW 04/19/2021 0945   APPEARANCEUR CLEAR 04/19/2021 0945   APPEARANCEUR Clear 07/08/2019 1825   LABSPEC 1.020 04/19/2021 0945   LABSPEC 1.026 04/06/2014 0751   PHURINE 5.5 04/19/2021 0945   GLUCOSEU 500 (A) 04/19/2021 0945   GLUCOSEU >=500 04/06/2014 0751   HGBUR NEGATIVE 04/19/2021 0945   BILIRUBINUR NEGATIVE 04/19/2021 0945   BILIRUBINUR Negative 07/08/2019 1825   BILIRUBINUR Negative 04/06/2014 0751   KETONESUR >160 (A) 04/19/2021 0945   PROTEINUR NEGATIVE 04/19/2021 0945   NITRITE NEGATIVE 04/19/2021 0945   LEUKOCYTESUR NEGATIVE 04/19/2021 0945   LEUKOCYTESUR Negative 04/06/2014 0751    Radiological Exams on Admission: US Abdomen Limited RUQ (LIVER/GB)  Result Date: 04/19/2021 CLINICAL DATA:  Right upper quadrant pain.  Nausea and vomiting EXAM: ULTRASOUND ABDOMEN LIMITED RIGHT UPPER QUADRANT COMPARISON:  CT abdomen 01/05/2021 FINDINGS: Gallbladder: No gallstones or wall thickening visualized. No sonographic Murphy sign noted by sonographer. Common bile duct: Diameter: 3.2 mm Liver: Increased echogenicity of the liver diffusely without focal liver lesion. Portal vein is patent on color Doppler imaging with normal direction of blood flow towards the liver. Other: None. IMPRESSION: Negative for gallstones or biliary dilatation Fatty liver Electronically Signed   By: Franchot Gallo M.D.   On: 04/19/2021 09:22     Assessment/Plan Principal Problem:   DKA, type 1 ([place]) Active Problems:   Diabetes mellitus type 1, uncontrolled ([place])   History of hepatitis C   Elevated LFTs   HTN (hypertension)   Non compliance w  medication regimen      Patient is a 30 year old male with a history of insulin-dependent diabetes mellitus who presents to the emergency room for evaluation of nausea and vomiting.    DKA, type 1 Secondary to  medication noncompliance Aggressive IV fluid hydration Continue insulin drip initiated in the ER Check and supplement electrolytes Diabetic education     Transaminitis Most likely secondary to known hepatitis C    Nicotine dependence Smoking cessation has been discussed with patient in detail Will place patient on a nicotine transdermal patch 21 mg daily    DVT prophylaxis: Lovenox  Code Status: full code  Family Communication: Greater than 50% of time was spent discussing patient's condition and plan of care with him at the bedside.  All questions and concerns have been addressed.  He verbalizes understanding and agrees to the plan. Disposition Plan: Back to previous home environment Consults called: none  Status:At the time of admission, it appears that the appropriate admission status for this patient is inpatient. This is judged to be reasonable and necessary to provide the required intensity of service to ensure the patient's safety given the presenting symptoms, physical exam findings, and initial radiographic and laboratory data in the context of their comorbid conditions. Patient requires inpatient status due to high intensity of service, high risk for further deterioration and high frequency of surveillance required.     Collier Bullock MD [facility]     04/19/2021, 12:07 PM

## 2021-04-19 NOTE — ED Notes (Signed)
First bag of potassium just taken down. Was infusing at 49mL/hr.  Called ICU and spoke with CN. They are full, not taking any ED patients, and are calling bed placement to have bed removed from trackboard.

## 2021-04-19 NOTE — ED Notes (Signed)
Dr Erma Heritage aware pt needs ultrasound IV. Second nurse unable to come to do. Pt still needs labs.

## 2021-04-19 NOTE — ED Notes (Signed)
CBG 179 

## 2021-04-19 NOTE — ED Notes (Signed)
IV team at bedside 

## 2021-04-19 NOTE — ED Notes (Signed)
CBG 332 

## 2021-04-19 NOTE — ED Provider Notes (Signed)
[facility] Emergency Department Provider Note  ____________________________________________   Event Date/Time   First MD Initiated Contact with Patient 04/19/21 [phone]     (approximate)  I have reviewed the triage vital signs and the nursing notes.   HISTORY  Chief Complaint Hyperglycemia and Abdominal Pain    HPI Trevor Brown is a 30 y.o. male  with extensive PMHx including h/o IVDU, T1DM, here with abd pain, n/v, body aches. Pt was here yesterday for similar complaints. Reports 2 days of n/v, inability to eat or drink. Reports he has been out of his long-acting insulin and he accidentally left his short acting insulin out of the fridge, so has essentially been without any insulin for several weeks. He has not been checking his BG. Was here yesterday for similar sx, states he didn't necessarily feel better but went home. Has been vomiting since then. Reports pain"everywhere," particularly upper abdomen and b/l legs. Cramping,aching. No specific alleviating factors.       Past Medical History:  Diagnosis Date   Heart attack ([place])    Pt claims he had heart attack a year ago ( 2015)- and was admitted in [facility] for that, but not given any meds or angiogram, on review of chart- I could not find any details like that.   Hepatitis C, acute may 2016   Heroin abuse ([place])    History of noncompliance with medical treatment    Hypertension    Type 1 diabetes ([place])     Patient Active Problem List   Diagnosis Date Noted   HTN (hypertension) 01/05/2021   GERD (gastroesophageal reflux disease) 01/05/2021   SIRS (systemic inflammatory response syndrome) ([place]) 03/03/2020   Cellulitis of left hand 03/03/2020   Elevated blood pressure reading 01/18/2020   Hypokalemia    Sepsis without acute organ dysfunction ([place])    Dental abscess 09/12/2019   Intractable vomiting    Hematemesis with nausea    Cannabis hyperemesis syndrome concurrent with and due to cannabis abuse  ([place])    Elevated LFTs    Abnormal laboratory test result 07/20/2019   Smoking 07/20/2019   History of hepatitis C 02/25/2019   Health care maintenance 02/25/2019   Peripheral neuropathy 02/25/2019   Hand abscess 10/13/2018   IVDU (intravenous drug user) 10/11/2018   Hyperkalemia 10/11/2018   Cellulitis and abscess of hand 08/17/2018   Polysubstance abuse ([place]) 01/20/2018   Abdominal pain 06/29/2017   Hyponatremia 06/29/2017   DKA (diabetic ketoacidosis) ([place]) 06/29/2017   Dental erosion extending into pulp 12/31/2016   MRSA carrier 07/05/2016   Elevated transaminase level 03/27/2016   Diabetes mellitus type 1 ([place]) 03/27/2016   Tobacco abuse counseling 03/27/2016   Transaminitis 03/25/2016   Leukocytosis 01/13/2016   Cannabinoid hyperemesis syndrome 01/10/2016   Tobacco abuse 01/10/2016   Type 1 diabetes mellitus with hyperglycemia ([place]) 12/12/2015   Chest pain 12/12/2015   Cocaine abuse ([place]) 12/12/2015   DKA (diabetic ketoacidoses) 06/10/2015   Hidradenitis suppurativa of left axilla    DKA, type 1 ([place]) 06/06/2015   Malnutrition of moderate degree ([place]) 04/08/2015   Hepatitis C 12/13/2014   Diabetes type 1, uncontrolled ([place]) 12/11/2014   Major depressive disorder, single episode, mild (HCC)    Major depression, single episode 12/10/2014   Diabetes mellitus type 1, uncontrolled ([place]) 12/09/2014   Homelessness 12/09/2014    Past Surgical History:  Procedure Laterality Date   INCISION AND DRAINAGE ABSCESS Left 03/04/2020   Procedure: INCISION AND DRAINAGE ABSCESS;  Surgeon:  Poggi, Marshall Cork, MD;  Location: [facility] ORS;  Service: Orthopedics;  Laterality: Left;   NO PAST SURGERIES     none      Prior to Admission medications   Medication Sig Start Date End Date Taking? Authorizing Provider  Insulin Glargine (BASAGLAR KWIKPEN) 100 UNIT/ML Inject 30 Units into the skin 2 (two) times daily. 01/08/21  Yes Sreenath, Sudheer B, MD  insulin lispro (HUMALOG KWIKPEN) 100 UNIT/ML  KwikPen INJECT 5-10 UNITS INTO THE SKIN 3 TIMES DAILY BEFORE MEALS PER SLIDING SCALE. IF BLOOD SUGAR IS 120-250=5 UNITS;251-350=8 UNITS; OVER 350=10 UNITS 04/19/21  Yes Iloabachie, [name], NP  blood glucose meter kit and supplies KIT USE AS DIRECTED UP TO 4 TIMES DAILY. 04/18/21   Harvest Dark, MD  gabapentin (NEURONTIN) 400 MG capsule Take 1 capsule (400 mg total) by mouth 3 (three) times daily. Patient not taking: Reported on 04/19/2021 04/19/21 05/19/21  Iloabachie, [name], NP  glucose blood (FREESTYLE LITE) test strip To be used as directed for checking her blood sugar 01/08/21   Sreenath, Sudheer B, MD  glucose blood (RIGHTEST GS550 BLOOD GLUCOSE) test strip USE AS DIRECTED UP TO 4 TIMES DAILY. 04/19/21   Cammy Copa, RPH  Insulin Pen Needle 32G X 4 MM MISC USE AS DIRECTED WITH INSULIN PENS. 01/08/21   Ellie Lunch K, RPH  Rightest GL300 Lancets MISC USE AS DIRECTED UP TO 4 TIMES DAILY. 04/19/21   Cammy Copa, RPH    Allergies Bee venom, Ibuprofen, Tramadol, and Vancomycin  Family History  Problem Relation Age of Onset   Cirrhosis Mother    Diabetes Mellitus II Maternal Grandmother    Diabetes Mellitus II Maternal Grandfather    CAD Father     Social History Social History   Tobacco Use   Smoking status: Every Day    Packs/day: 2.00    Types: Cigarettes   Smokeless tobacco: Never  Vaping Use   Vaping Use: Never used  Substance Use Topics   Alcohol use: No    Alcohol/week: 0.0 standard drinks   Drug use: Not Currently    Types: IV, Heroin, Marijuana    Comment: heroin-     Review of Systems  Review of Systems  Constitutional:  Positive for fatigue. Negative for chills and fever.  HENT:  Negative for sore throat.   Respiratory:  Negative for shortness of breath.   Cardiovascular:  Negative for chest pain.  Gastrointestinal:  Positive for abdominal pain, nausea and vomiting.  Genitourinary:  Negative for flank pain.  Musculoskeletal:  Positive for  arthralgias and myalgias. Negative for neck pain.  Skin:  Negative for rash and wound.  Allergic/Immunologic: Negative for immunocompromised state.  Neurological:  Positive for weakness. Negative for numbness.  Hematological:  Does not bruise/bleed easily.  All other systems reviewed and are negative.   ____________________________________________  PHYSICAL EXAM:      VITAL SIGNS: ED Triage Vitals  Enc Vitals Group     BP      Pulse      Resp      Temp      Temp src      SpO2      Weight      Height      Head Circumference      Peak Flow      Pain Score      Pain Loc      Pain Edu?      Excl. in [place]?  Physical Exam Vitals and nursing note reviewed.  Constitutional:      General: He is not in acute distress.    Appearance: He is well-developed.  HENT:     Head: Normocephalic and atraumatic.     Mouth/Throat:     Comments: Dry MM Eyes:     Conjunctiva/sclera: Conjunctivae normal.  Cardiovascular:     Rate and Rhythm: Normal rate and regular rhythm.     Heart sounds: Normal heart sounds.  Pulmonary:     Effort: Pulmonary effort is normal. No respiratory distress.     Breath sounds: No wheezing.  Abdominal:     General: There is no distension.     Tenderness: There is abdominal tenderness in the right upper quadrant and epigastric area.  Musculoskeletal:     Cervical back: Neck supple.  Skin:    General: Skin is warm.     Capillary Refill: Capillary refill takes less than 2 seconds.     Findings: No rash.  Neurological:     Mental Status: He is alert and oriented to person, place, and time.     Motor: No abnormal muscle tone.      ____________________________________________   LABS (all labs ordered are listed, but only abnormal results are displayed)  Labs Reviewed  CBC WITH DIFFERENTIAL/PLATELET - Abnormal; Notable for the following components:      Result Value   WBC 16.4 (*)    Neutro Abs 14.2 (*)    Abs Immature Granulocytes 0.08 (*)     All other components within normal limits  COMPREHENSIVE METABOLIC PANEL - Abnormal; Notable for the following components:   CO2 21 (*)    Glucose, Bld 331 (*)    BUN 21 (*)    AST 91 (*)    ALT 113 (*)    Total Bilirubin 1.3 (*)    Anion gap 17 (*)    All other components within normal limits  BLOOD GAS, VENOUS - Abnormal; Notable for the following components:   pCO2, Ven 40 (*)    All other components within normal limits  BETA-HYDROXYBUTYRIC ACID - Abnormal; Notable for the following components:   Beta-Hydroxybutyric Acid 2.97 (*)    All other components within normal limits  URINALYSIS, ROUTINE W REFLEX MICROSCOPIC - Abnormal; Notable for the following components:   Glucose, UA 500 (*)    Ketones, ur >160 (*)    All other components within normal limits  CBG MONITORING, ED - Abnormal; Notable for the following components:   Glucose-Capillary 503 (*)    All other components within normal limits  CBG MONITORING, ED - Abnormal; Notable for the following components:   Glucose-Capillary 332 (*)    All other components within normal limits  RESP PANEL BY RT-PCR (FLU A&B, COVID) ARPGX2  BASIC METABOLIC PANEL  BASIC METABOLIC PANEL  BASIC METABOLIC PANEL  BASIC METABOLIC PANEL  URINALYSIS, ROUTINE W REFLEX MICROSCOPIC  BETA-HYDROXYBUTYRIC ACID    ____________________________________________  EKG: Normal sinus rhythm, ventricular rate 74.  PR 108, QRS 91, QTc 430.  No acute ST elevations or depressions.  No EKG evidence of acute ischemia or infarct. ________________________________________  RADIOLOGY All imaging, including plain films, CT scans, and ultrasounds, independently reviewed by me, and interpretations confirmed via formal radiology reads.  ED MD interpretation:   RUQ [place]: Negative for acute changes  Official radiology report(s): US Abdomen Limited RUQ (LIVER/GB)  Result Date: 04/19/2021 CLINICAL DATA:  Right upper quadrant pain.  Nausea and vomiting EXAM:  ULTRASOUND ABDOMEN  LIMITED RIGHT UPPER QUADRANT COMPARISON:  CT abdomen 01/05/2021 FINDINGS: Gallbladder: No gallstones or wall thickening visualized. No sonographic Murphy sign noted by sonographer. Common bile duct: Diameter: 3.2 mm Liver: Increased echogenicity of the liver diffusely without focal liver lesion. Portal vein is patent on color Doppler imaging with normal direction of blood flow towards the liver. Other: None. IMPRESSION: Negative for gallstones or biliary dilatation Fatty liver Electronically Signed   By: Franchot Gallo M.D.   On: 04/19/2021 09:22    ____________________________________________  PROCEDURES   Procedure(s) performed (including Critical Care):  .Critical Care Performed by: Duffy Bruce, MD Authorized by: Duffy Bruce, MD   Critical care provider statement:    Critical care time (minutes):  35   Critical care time was exclusive of:  Separately billable procedures and treating other patients and teaching time   Critical care was necessary to treat or prevent imminent or life-threatening deterioration of the following conditions:  Cardiac failure, circulatory failure and respiratory failure   Critical care was time spent personally by me on the following activities:  Development of treatment plan with patient or surrogate, discussions with consultants, evaluation of patient's response to treatment, examination of patient, obtaining history from patient or surrogate, ordering and performing treatments and interventions, ordering and review of laboratory studies, ordering and review of radiographic studies, pulse oximetry, re-evaluation of patient's condition and review of old charts   I assumed direction of critical care for this patient from another provider in my specialty: no    ____________________________________________  INITIAL IMPRESSION / MDM / [place] / ED COURSE  As part of my medical decision making, I reviewed the following data  within the [facility] notes reviewed and incorporated, Old chart reviewed, Notes from prior ED visits, and [place] Controlled Substance Database       *DEO MEHRINGER was evaluated in Emergency Department on 04/19/2021 for the symptoms described in the history of present illness. He was evaluated in the context of the global COVID-19 pandemic, which necessitated consideration that the patient might be at risk for infection with the SARS-CoV-2 virus that causes COVID-19. Institutional protocols and algorithms that pertain to the evaluation of patients at risk for COVID-19 are in a state of rapid change based on information released by regulatory bodies including the CDC and federal and state organizations. These policies and algorithms were followed during the patient's care in the ED.  Some ED evaluations and interventions may be delayed as a result of limited staffing during the pandemic.*     Medical Decision Making:  30 yo M here with n/v. H/o T1Dm with poor medication adherence. Here yesterday without DKA, but concern for ongoing n/v with new DKA today.  Patient now has greater than 160 ketones in his urine, worsening leukocytosis, as well as elevated anion gap of 17.  pH is normal.  Beta hydroxybutyrate is 2.97.  These are consistent with DKA.  Patient has moderate persistent mild transaminitis, ultrasound obtained and is negative.  EKG nonischemic.  Suspect this is due to nonadherence.  Will admit to medicine.  IV fluids and IV insulin drip started.  ____________________________________________  FINAL CLINICAL IMPRESSION(S) / ED DIAGNOSES  Final diagnoses:  RUQ pain     MEDICATIONS GIVEN DURING THIS VISIT:  Medications  insulin regular, human (MYXREDLIN) 100 units/ 100 mL infusion (has no administration in time range)  lactated ringers infusion ( Intravenous New Bag/Given 04/19/21 1125)  dextrose 5 % in lactated ringers infusion (has  no administration in time range)   dextrose 50 % solution 0-50 mL (has no administration in time range)  potassium chloride 10 mEq in 100 mL IVPB (has no administration in time range)  lactated ringers bolus 1,000 mL (0 mLs Intravenous Stopped 04/19/21 1123)  haloperidol lactate (HALDOL) injection 2.5 mg (2.5 mg Intravenous Given 04/19/21 0853)     ED Discharge Orders     None        Note:  This document was prepared using Dragon voice recognition software and may include unintentional dictation errors.   Duffy Bruce, MD 04/19/21 [phone]

## 2021-04-19 NOTE — ED Notes (Signed)
Cbg 156.

## 2021-04-19 NOTE — ED Notes (Signed)
CBG 308 

## 2021-04-19 NOTE — ED Notes (Signed)
CBG 152  

## 2021-04-19 NOTE — ED Notes (Signed)
Two nurses unable to obtain labs. Second nurse will return with u/s for IV and labs.

## 2021-04-19 NOTE — ED Notes (Signed)
Dr Agbata at bedside. 

## 2021-04-19 NOTE — ED Notes (Signed)
Provided [place] sandwich tray and diet sprite.

## 2021-04-19 NOTE — ED Triage Notes (Addendum)
From home, [facility], was seen yesterday same ED Pt called EMS for NV and abdominal pain this morning  CBG on EMS glucometer reading "HI", has received IV fluids and 4mg  IV zofran at 0719, pt took 15 units aspart at 0634.  Insulin was not in fridge since 2-3d. After IV fluids, CBG was 543.  Pt states has not eaten for 2 days  CBG currently 503.  Pt is known IV drug user and has hep C. States has not injected drugs since 1 year ago

## 2021-04-19 NOTE — ED Notes (Signed)
Provided urinal and diet sprite with ice.

## 2021-04-19 NOTE — ED Notes (Signed)
Lab called to come draw ordered labs 

## 2021-04-20 DIAGNOSIS — K529 Noninfective gastroenteritis and colitis, unspecified: Secondary | ICD-10-CM

## 2021-04-20 DIAGNOSIS — E1065 Type 1 diabetes mellitus with hyperglycemia: Principal | ICD-10-CM

## 2021-04-20 LAB — GLUCOSE, CAPILLARY
Glucose-Capillary: 222 mg/dL — ABNORMAL HIGH (ref 70–99)
Glucose-Capillary: 274 mg/dL — ABNORMAL HIGH (ref 70–99)
Glucose-Capillary: 225 mg/dL — ABNORMAL HIGH (ref 70–99)
Glucose-Capillary: 395 mg/dL — ABNORMAL HIGH (ref 70–99)
Glucose-Capillary: 270 mg/dL — ABNORMAL HIGH (ref 70–99)

## 2021-04-20 LAB — BASIC METABOLIC PANEL
Anion gap: 9 (ref 5–15)
BUN: 16 mg/dL (ref 6–20)
CO2: 26 mmol/L (ref 22–32)
Calcium: 7.9 mg/dL — ABNORMAL LOW (ref 8.9–10.3)
Chloride: 96 mmol/L — ABNORMAL LOW (ref 98–111)
Creatinine, Ser: 0.83 mg/dL (ref 0.61–1.24)
GFR, Estimated: >60 mL/min (ref >60)
Glucose, Bld: 234 mg/dL — ABNORMAL HIGH (ref 70–99)
Potassium: 3.3 mmol/L — ABNORMAL LOW (ref 3.5–5.1)
Sodium: 131 mmol/L — ABNORMAL LOW (ref 135–145)

## 2021-04-20 LAB — MAGNESIUM: Magnesium: 1.3 mg/dL — ABNORMAL LOW (ref 1.7–2.4)

## 2021-04-20 LAB — CBG MONITORING, ED: Glucose-Capillary: 103 mg/dL — ABNORMAL HIGH (ref 70–99)

## 2021-04-20 MED ORDER — POTASSIUM CHLORIDE 10 MEQ/100ML IV SOLN
10.0000 meq | INTRAVENOUS | Status: AC
  Administered 2021-04-20 (×3): 10 meq via INTRAVENOUS
  Filled 2021-04-20 (×3): qty 100

## 2021-04-20 MED ORDER — METOCLOPRAMIDE HCL 5 MG PO TABS: 5.0000 mg | ORAL_TABLET | Freq: Three times a day (TID) | ORAL | Status: DC

## 2021-04-20 MED ORDER — INSULIN GLARGINE-YFGN 100 UNIT/ML ~~LOC~~ SOLN
20.0000 [IU] | Freq: Two times a day (BID) | SUBCUTANEOUS | Status: DC
  Administered 2021-04-20: 20 [IU] via SUBCUTANEOUS
  Filled 2021-04-20 (×2): qty 0.2

## 2021-04-20 MED ORDER — ENSURE MAX PROTEIN PO LIQD
11.0000 [oz_av] | Freq: Every day | ORAL | Status: DC
  Administered 2021-04-20 – 2021-04-21 (×2): 11 [oz_av] via ORAL
  Filled 2021-04-20: qty 330

## 2021-04-20 MED ORDER — MAGNESIUM SULFATE 4 GM/100ML IV SOLN
4.0000 g | Freq: Once | INTRAVENOUS | Status: AC
  Administered 2021-04-20: 4 g via INTRAVENOUS
  Filled 2021-04-20: qty 100

## 2021-04-20 MED ORDER — GLUCERNA SHAKE PO LIQD
237.0000 mL | Freq: Three times a day (TID) | ORAL | Status: DC
Start: 2021-04-20 — End: 2021-04-21
  Administered 2021-04-20 – 2021-04-21 (×4): 237 mL via ORAL

## 2021-04-20 MED ORDER — OXYCODONE HCL 5 MG PO TABS
5.0000 mg | ORAL_TABLET | ORAL | Status: DC | PRN
  Administered 2021-04-20 – 2021-04-21 (×8): 5 mg via ORAL
  Filled 2021-04-20 (×8): qty 1

## 2021-04-20 MED ORDER — GABAPENTIN 400 MG PO CAPS
400.0000 mg | ORAL_CAPSULE | Freq: Two times a day (BID) | ORAL | Status: DC
  Administered 2021-04-20 – 2021-04-21 (×3): 400 mg via ORAL
  Filled 2021-04-20 (×3): qty 1

## 2021-04-20 MED ORDER — ADULT MULTIVITAMIN W/MINERALS CH
1.0000 | ORAL_TABLET | Freq: Every day | ORAL | Status: DC
  Administered 2021-04-20 – 2021-04-21 (×2): 1 via ORAL
  Filled 2021-04-20 (×2): qty 1

## 2021-04-20 MED ORDER — MORPHINE SULFATE (PF) 2 MG/ML IV SOLN
1.0000 mg | Freq: Once | INTRAVENOUS | Status: AC
  Administered 2021-04-20: 1 mg via INTRAVENOUS
  Filled 2021-04-20: qty 1

## 2021-04-20 NOTE — Progress Notes (Signed)
Initial Nutrition Assessment  DOCUMENTATION CODES:  Non-severe (moderate) malnutrition in context of chronic illness  INTERVENTION:  Consider obtaining updated HbA1c level.  Add Glucerna Shake po TID, each supplement provides 220 kcal and 10 grams of protein.  Add Ensure Max po daily, each supplement provides 150 kcal and 30 grams of protein.    Add MVI with minerals daily.  NUTRITION DIAGNOSIS:  Moderate Malnutrition related to chronic illness (uncontrolled T1DM) as evidenced by mild fat depletion, moderate muscle depletion.  GOAL:  Patient will meet greater than or equal to 90% of their needs  MONITOR:  PO intake, Supplement acceptance, Labs, Weight trends, I & O's  REASON FOR ASSESSMENT:  Malnutrition Screening Tool    ASSESSMENT:  30 yo male with a PMH of T1DM, heroine abuse, HTN, Hep C, nicotine dependence, and history of medication noncompliance who is admitted with DKA.  Spoke with pt at bedside. Pt reports that he has limited access to food even with benefits, so he struggles to "count carbs" the way they need to be counted when he is able to only eat what he can find.  Per Epic, pt ate 90% of breakfast this morning.  Emphasized the importance of taking insulin with T1DM.  RD provided "Carbohydrate Counting for People with Diabetes" handout from the Academy of Nutrition and Dietetics. Discussed different food groups and their effects on blood sugar, emphasizing carbohydrate-containing foods. Provided list of carbohydrates and recommended serving sizes of common foods.  Discussed importance of controlled and consistent carbohydrate intake throughout the day. Provided examples of ways to balance meals/snacks and encouraged intake of high-fiber, whole grain complex carbohydrates. Teach back method used.  Expect fair compliance.  He reports working in [employer], and that is apparent through his exam - pt with good muscle tone despite being at a lower  weight.  Per Epic, pt's weight appears to be stable for the past 3 months. Pt reports his UBW is at 145 lbs. He weighed on admission at 125 lbs. He reports this is something he noticed, but is unsure for how long it has been going on now.  Recommend adding Glucerna TID and Ensure Max daily, as well as MVI with minerals daily.  Medications: reviewed; SSI, Semglee, Reglan QID, Protonix, NaCl @ 100 ml/hr, K-Cl 10 mEq via IV, oxycodone PO PRN (given once today)  Labs: reviewed; Na 131 (L), K 3.3 (L), CBG 103-274 (H) HbA1c: 10.7% (12/28/2020)  NUTRITION - FOCUSED PHYSICAL EXAM: Flowsheet Row Most Recent Value  Orbital Region No depletion  Upper Arm Region Mild depletion  Thoracic and Lumbar Region No depletion  Buccal Region Mild depletion  Temple Region Mild depletion  Clavicle Bone Region Moderate depletion  Clavicle and Acromion Bone Region Moderate depletion  Scapular Bone Region Unable to assess  Dorsal Hand No depletion  Patellar Region No depletion  Anterior Thigh Region No depletion  Posterior Calf Region No depletion  Edema (RD Assessment) None  Hair Reviewed  Eyes Reviewed  Mouth Reviewed  Skin Reviewed  Nails Reviewed   Diet Order:   Diet Order             Diet Carb Modified Fluid consistency: Thin; Room service appropriate? Yes  Diet effective now                  EDUCATION NEEDS:  Education needs have been addressed  Skin:  Skin Assessment: Reviewed RN Assessment  Last BM:  PTA/unknown  Height:  Ht Readings from Last 1  Encounters:  04/20/21 5' 4.02" (1.626 m)   Weight:  Wt Readings from Last 1 Encounters:  04/20/21 53.4 kg   BMI:  Body mass index is 20.2 kg/m.  Estimated Nutritional Needs:  Kcal:  2100-2300 Protein:  75-90 grams Fluid:  >2.1 L  Vertell Limber, RD, LDN (she/her/hers) Registered Dietitian I After-Hours/Weekend Pager # in [place]

## 2021-04-20 NOTE — Progress Notes (Signed)
PROGRESS NOTE    Trevor Brown  ATF:[id number] DOB: 21-Aug-1990 DOA: 04/19/2021 PCP: Rolm Gala, NP   Chief complaint.  Nausea and vomiting. Brief Narrative:   Trevor Brown is a 30 y.o. male with medical history significant for insulin-dependent diabetes mellitus, nicotine dependence, hepatitis C, medication noncompliance who presents to the emergency room for the second time in 24 hours for evaluation of feeling unwell.  He has been having nausea vomiting and diarrhea for 3 days.  He also missed dose of Lantus before admission.   Assessment & Plan:   Principal Problem:   DKA, type 1 (HCC) Active Problems:   Diabetes mellitus type 1, uncontrolled (HCC)   DKA (diabetic ketoacidosis) (HCC)   History of hepatitis C   Elevated LFTs   HTN (hypertension)   Non compliance w medication regimen  Uncontrolled type 1 diabetes with hyperglycemia. Nausea vomiting diarrhea secondary to gastroenteritis.  Reviewed the patient labs, patient does not have DKA.  He has hyperglycemia secondary to noncompliance with insulin.  Lantus and short acting insulin was started. Patient also received IV fluids, patient nausea vomiting is better today, will discontinue fluids.  Diet started.  Hypokalemia Hypomagnesemia. Hyponatremia. Patient is tolerating diet, will discontinue fluids. Replete potassium and magnesium.    DVT prophylaxis: Lovenox Code Status: full Family Communication:  Disposition Plan:    Status is: Inpatient  Remains inpatient appropriate because:IV treatments appropriate due to intensity of illness or inability to take PO and Inpatient level of care appropriate due to severity of illness  Dispo: The patient is from: Home              Anticipated d/c is to: Home              Patient currently is not medically stable to d/c.   Difficult to place patient No        I/O last 3 completed shifts: In: 5069 [P.O.:740; I.V.:1840; IV Piggyback:2489] Out: 3100  [Urine:3100] No intake/output data recorded.     Consultants:  None  Procedures: None  Antimicrobials: None  Subjective: Patient still feeling nauseous, but tolerating breakfast this morning.  No nausea vomiting today. Complaining of bilateral leg cramping, no claudication.  No edema Denies any short of breath. No cough. No fever or chills.  Objective: Vitals:   04/20/21 0124 04/20/21 0200 04/20/21 0439 04/20/21 0826  BP: (!) 154/99 (!) 165/103 (!) 149/100 (!) 156/99  Pulse: (!) 55 (!) 54 (!) 57 (!) 59  Resp: 17  16 18   Temp: (!) 97.4 F (36.3 C)  98.8 F (37.1 C) 98.4 F (36.9 C)  TempSrc: Oral   Oral  SpO2: 100% 100% 99% 100%  Weight:   53.4 kg   Height:   5' 4.02" (1.626 m)     Intake/Output Summary (Last 24 hours) at 04/20/2021 0955 Last data filed at 04/20/2021 0700 Gross per 24 hour  Intake 4768.95 ml  Output 2900 ml  Net 1868.95 ml   Filed Weights   04/19/21 0751 04/20/21 0439  Weight: 54.4 kg 53.4 kg    Examination:  General exam: Appears calm and comfortable  Respiratory system: Clear to auscultation. Respiratory effort normal. Cardiovascular system: S1 & S2 heard, RRR. No JVD, murmurs, rubs, gallops or clicks. No pedal edema. Gastrointestinal system: Abdomen is nondistended, soft and nontender. No organomegaly or masses felt. Normal bowel sounds heard. Central nervous system: Alert and oriented. No focal neurological deficits. Extremities: Symmetric 5 x 5 power. Skin: No rashes, lesions or  ulcers Psychiatry: Judgement and insight appear normal. Mood & affect appropriate.     Data Reviewed: I have personally reviewed following labs and imaging studies  CBC: Recent Labs  Lab 04/18/21 1401 04/19/21 0931  WBC 10.6* 16.4*  NEUTROABS  --  14.2*  HGB 14.1 13.5  HCT 40.3 40.0  MCV 88.0 86.6  PLT 216 245   Basic Metabolic Panel: Recent Labs  Lab 04/19/21 0931 04/19/21 1235 04/19/21 1600 04/19/21 1645 04/20/21 0623  NA 137 136 135 133*  131*  K 3.9 3.7 3.7 4.0 3.3*  CL 99 101 102 99 96*  CO2 21* 24 27 27 26   GLUCOSE 331* 288* 168* 285* 234*  BUN 21* 20 22* 23* 16  CREATININE 1.19 1.03 0.95 1.00 0.83  CALCIUM 9.2 8.4* 8.2* 8.2* 7.9*  MG  --   --   --   --  1.3*   GFR: Estimated Creatinine Clearance: 100.1 mL/min (by C-G formula based on SCr of 0.83 mg/dL). Liver Function Tests: Recent Labs  Lab 04/18/21 1453 04/19/21 0931  AST 112* 91*  ALT 115* 113*  ALKPHOS 131* 103  BILITOT 0.7 1.3*  PROT 7.0 6.9  ALBUMIN 3.8 3.9   No results for input(s): LIPASE, AMYLASE in the last 168 hours. No results for input(s): AMMONIA in the last 168 hours. Coagulation Profile: No results for input(s): INR, PROTIME in the last 168 hours. Cardiac Enzymes: No results for input(s): CKTOTAL, CKMB, CKMBINDEX, TROPONINI in the last 168 hours. BNP (last 3 results) No results for input(s): PROBNP in the last 8760 hours. HbA1C: No results for input(s): HGBA1C in the last 72 hours. CBG: Recent Labs  Lab 04/19/21 2151 04/19/21 2323 04/20/21 0126 04/20/21 0507 04/20/21 0740  GLUCAP 259* 155* 103* 222* 274*   Lipid Profile: No results for input(s): CHOL, HDL, LDLCALC, TRIG, CHOLHDL, LDLDIRECT in the last 72 hours. Thyroid Function Tests: No results for input(s): TSH, T4TOTAL, FREET4, T3FREE, THYROIDAB in the last 72 hours. Anemia Panel: No results for input(s): VITAMINB12, FOLATE, FERRITIN, TIBC, IRON, RETICCTPCT in the last 72 hours. Sepsis Labs: No results for input(s): PROCALCITON, LATICACIDVEN in the last 168 hours.  Recent Results (from the past 240 hour(s))  Resp Panel by RT-PCR (Flu A&B, Covid) Nasopharyngeal Swab     Status: None   Collection Time: 04/19/21  7:57 AM   Specimen: Nasopharyngeal Swab; Nasopharyngeal(NP) swabs in vial transport medium  Result Value Ref Range Status   SARS Coronavirus 2 by RT PCR NEGATIVE NEGATIVE Final    Comment: (NOTE) SARS-CoV-2 target nucleic acids are NOT DETECTED.  The SARS-CoV-2  RNA is generally detectable in upper respiratory specimens during the acute phase of infection. The lowest concentration of SARS-CoV-2 viral copies this assay can detect is 138 copies/mL. A negative result does not preclude SARS-Cov-2 infection and should not be used as the sole basis for treatment or other patient management decisions. A negative result may occur with  improper specimen collection/handling, submission of specimen other than nasopharyngeal swab, presence of viral mutation(s) within the areas targeted by this assay, and inadequate number of viral copies(<138 copies/mL). A negative result must be combined with clinical observations, patient history, and epidemiological information. The expected result is Negative.  Fact Sheet for Patients:  04/21/21  Fact Sheet for Healthcare Providers:  BloggerCourse.com  This test is no t yet approved or cleared by the [employer] FDA and  has been authorized for detection and/or diagnosis of SARS-CoV-2 by FDA under an Emergency Use Authorization (EUA).  This EUA will remain  in effect (meaning this test can be used) for the duration of the COVID-19 declaration under Section 564(b)(1) of the Act, 21 U.S.C.section 360bbb-3(b)(1), unless the authorization is terminated  or revoked sooner.       Influenza A by PCR NEGATIVE NEGATIVE Final   Influenza B by PCR NEGATIVE NEGATIVE Final    Comment: (NOTE) The Xpert Xpress SARS-CoV-2/FLU/RSV plus assay is intended as an aid in the diagnosis of influenza from Nasopharyngeal swab specimens and should not be used as a sole basis for treatment. Nasal washings and aspirates are unacceptable for Xpert Xpress SARS-CoV-2/FLU/RSV testing.  Fact Sheet for Patients: BloggerCourse.com  Fact Sheet for Healthcare Providers: SeriousBroker.it  This test is not yet approved or cleared by the  [place] FDA and has been authorized for detection and/or diagnosis of SARS-CoV-2 by FDA under an Emergency Use Authorization (EUA). This EUA will remain in effect (meaning this test can be used) for the duration of the COVID-19 declaration under Section 564(b)(1) of the Act, 21 U.S.C. section 360bbb-3(b)(1), unless the authorization is terminated or revoked.  Performed at [facility], [address], [place], [place] [address]          Radiology Studies: US Abdomen Limited RUQ (LIVER/GB)  Result Date: 04/19/2021 CLINICAL DATA:  Right upper quadrant pain.  Nausea and vomiting EXAM: ULTRASOUND ABDOMEN LIMITED RIGHT UPPER QUADRANT COMPARISON:  CT abdomen 01/05/2021 FINDINGS: Gallbladder: No gallstones or wall thickening visualized. No sonographic Murphy sign noted by sonographer. Common bile duct: Diameter: 3.2 mm Liver: Increased echogenicity of the liver diffusely without focal liver lesion. Portal vein is patent on color Doppler imaging with normal direction of blood flow towards the liver. Other: None. IMPRESSION: Negative for gallstones or biliary dilatation Fatty liver Electronically Signed   By: Marlan Palau M.D.   On: 04/19/2021 09:22        Scheduled Meds:  enoxaparin (LOVENOX) injection  40 mg Subcutaneous Q24H   insulin aspart  0-20 Units Subcutaneous TID AC & HS   insulin glargine-yfgn  20 Units Subcutaneous Q24H   nicotine  21 mg Transdermal Daily   pantoprazole (PROTONIX) IV  40 mg Intravenous Daily   Continuous Infusions:  lactated ringers Stopped (04/19/21 2324)   magnesium sulfate bolus IVPB     potassium chloride       LOS: 1 day    Time spent: 27 minutes    Marrion Coy, MD [facility]   To contact the attending provider between 7A-7P or the covering provider during after hours 7P-7A, please log into the web site [email or web address] and access using universal [place] password for that web site. If you do not have the password,  please call the hospital operator.  04/20/2021, 9:55 AM

## 2021-04-21 LAB — CBC WITH DIFFERENTIAL/PLATELET
Abs Immature Granulocytes: 0.02 10*3/uL (ref 0.00–0.07)
Basophils Absolute: 0 10*3/uL (ref 0.0–0.1)
Basophils Relative: 1 %
Eosinophils Absolute: 0.2 10*3/uL (ref 0.0–0.5)
Eosinophils Relative: 2 %
HCT: 35.8 % — ABNORMAL LOW (ref 39.0–52.0)
Hemoglobin: 12.4 g/dL — ABNORMAL LOW (ref 13.0–17.0)
Immature Granulocytes: 0 %
Lymphocytes Relative: 30 %
Lymphs Abs: 2.5 10*3/uL (ref 0.7–4.0)
MCH: 29.7 pg (ref 26.0–34.0)
MCHC: 34.6 g/dL (ref 30.0–36.0)
MCV: 85.6 fL (ref 80.0–100.0)
Monocytes Absolute: 0.7 10*3/uL (ref 0.1–1.0)
Monocytes Relative: 8 %
Neutro Abs: 5 10*3/uL (ref 1.7–7.7)
Neutrophils Relative %: 59 %
Platelets: 207 10*3/uL (ref 150–400)
RBC: 4.18 MIL/uL — ABNORMAL LOW (ref 4.22–5.81)
RDW: 13.3 % (ref 11.5–15.5)
WBC: 8.4 10*3/uL (ref 4.0–10.5)
nRBC: 0 % (ref 0.0–0.2)

## 2021-04-21 LAB — GLUCOSE, CAPILLARY
Glucose-Capillary: 293 mg/dL — ABNORMAL HIGH (ref 70–99)
Glucose-Capillary: 289 mg/dL — ABNORMAL HIGH (ref 70–99)
Glucose-Capillary: 246 mg/dL — ABNORMAL HIGH (ref 70–99)

## 2021-04-21 LAB — BASIC METABOLIC PANEL
Anion gap: 8 (ref 5–15)
BUN: 20 mg/dL (ref 6–20)
CO2: 26 mmol/L (ref 22–32)
Calcium: 8.5 mg/dL — ABNORMAL LOW (ref 8.9–10.3)
Chloride: 100 mmol/L (ref 98–111)
Creatinine, Ser: 0.9 mg/dL (ref 0.61–1.24)
GFR, Estimated: >60 mL/min (ref >60)
Glucose, Bld: 256 mg/dL — ABNORMAL HIGH (ref 70–99)
Potassium: 4 mmol/L (ref 3.5–5.1)
Sodium: 134 mmol/L — ABNORMAL LOW (ref 135–145)

## 2021-04-21 LAB — MAGNESIUM: Magnesium: 2 mg/dL (ref 1.7–2.4)

## 2021-04-21 MED ORDER — INSULIN GLARGINE-YFGN 100 UNIT/ML ~~LOC~~ SOLN
30.0000 [IU] | Freq: Two times a day (BID) | SUBCUTANEOUS | Status: DC
  Administered 2021-04-21: 30 [IU] via SUBCUTANEOUS
  Filled 2021-04-21 (×3): qty 0.3

## 2021-04-21 NOTE — Progress Notes (Addendum)
Patient discharging home. Vital signs stable at time of discharge as reflected in discharge summary. Discharge instructions given and verbal understanding returned. Patient has follow up appointment scheduled. Patient discharging with vial of Lantus and vial of novolog. No questions at this time

## 2021-04-21 NOTE — Discharge Summary (Signed)
Physician Discharge Summary  Patient ID: Trevor Brown MRN: [id number] DOB/AGE: 1991/03/25 30 y.o.  Admit date: 04/19/2021 Discharge date: 04/21/2021  Admission Diagnoses:  Discharge Diagnoses:  Principal Problem:   DKA, type 1 ([place]) Active Problems:   Diabetes mellitus type 1, uncontrolled   Gastroenteritis   DKA (diabetic ketoacidosis) ([place])   History of hepatitis C   Elevated LFTs   HTN (hypertension)   Non compliance w medication regimen   Discharged Condition: good  Hospital Course:   Trevor Brown is a 30 y.o. male with medical history significant for insulin-dependent diabetes mellitus, nicotine dependence, hepatitis C, medication noncompliance who presents to the emergency room for the second time in 24 hours for evaluation of feeling unwell.  He has been having nausea vomiting and diarrhea for 3 days.  He also missed dose of Lantus before admission.   Uncontrolled type 1 diabetes with hyperglycemia. Nausea vomiting diarrhea secondary to gastroenteritis.   Reviewed the patient labs, patient does not have DKA.  He has hyperglycemia secondary to noncompliance with insulin.  Lantus and short acting insulin was started. Patient also received IV fluids, patient nausea vomiting is improved, patient has been tolerating diet well. Discussed with patient the importance of compliance with insulin.  Currently he does not have any insulin at home.  I have instructed pharmacy to give him whole bottles of Lantus and NovoLog prior to discharge.  Patient needs to get more prescriptions from his primary care physician.   Hypokalemia Hypomagnesemia. Hyponatremia. Conditions are resolved.       Consults: None  Significant Diagnostic Studies:   Treatments: IVF, insulin  Discharge Exam: Blood pressure (!) 142/93, pulse 68, temperature 98.8 F (37.1 C), temperature source Oral, resp. rate 18, height 5' 4.02" (1.626 m), weight 53.4 kg, SpO2 99 %. General appearance: alert and  cooperative Resp: clear to auscultation bilaterally Cardio: regular rate and rhythm, S1, S2 normal, no murmur, click, rub or gallop GI: soft, non-tender; bowel sounds normal; no masses,  no organomegaly Extremities: extremities normal, atraumatic, no cyanosis or edema  Disposition: Discharge disposition: 01-Home or Self Care       Discharge Instructions     Diet Carb Modified   Complete by: As directed    Increase activity slowly   Complete by: As directed       Allergies as of 04/21/2021       Reactions   Bee Venom Anaphylaxis   Ibuprofen Itching, Rash, Other (See Comments)   Other reaction(s): Other (See Comments) Stomach upset Reaction:  GI upset    Tramadol Hives, Swelling   Vancomycin Rash, Other (See Comments)   Reaction:  Red man's syndrome         Medication List     TAKE these medications    Basaglar KwikPen 100 UNIT/ML Inject 30 Units into the skin 2 (two) times daily.   blood glucose meter kit and supplies Kit USE AS DIRECTED UP TO 4 TIMES DAILY.   Comfort EZ Pen Needles 32G X 4 MM Misc Generic drug: Insulin Pen Needle USE AS DIRECTED WITH INSULIN PENS.   FREESTYLE LITE test strip Generic drug: glucose blood To be used as directed for checking her blood sugar What changed: Another medication with the same name was added. Make sure you understand how and when to take each.   Rightest [id number] Blood Glucose test strip Generic drug: glucose blood USE AS DIRECTED UP TO 4 TIMES DAILY. What changed: You were already taking a medication with the  same name, and this prescription was added. Make sure you understand how and when to take each.   gabapentin 400 MG capsule Commonly known as: NEURONTIN Take 1 capsule (400 mg total) by mouth 3 (three) times daily.   HumaLOG KwikPen 100 UNIT/ML KwikPen Generic drug: insulin lispro INJECT 5-10 UNITS INTO THE SKIN 3 TIMES DAILY BEFORE MEALS PER SLIDING SCALE. IF BLOOD SUGAR IS 120-250=5 UNITS;251-350=8 UNITS;  OVER 350=10 UNITS What changed: how to take this   Rightest GL300 Lancets Misc USE AS DIRECTED UP TO 4 TIMES DAILY.        Follow-up Information     Iloabachie, [name], NP Follow up in 1 week(s).   Specialty: Gerontology Contact information: [address] [place] [address] [phone]                32 minutes Signed: Sharen Hones 04/21/2021, 10:15 AM

## 2021-04-24 ENCOUNTER — Telehealth: Payer: Self-pay | Admitting: Gerontology

## 2021-04-24 NOTE — Telephone Encounter (Signed)
-----   Message from Rolm Gala, NP sent at 04/22/2021  9:18 PM EDT ----- Pls schedule in clinic appointment for Mr Dollins in 2 weeks. Pls make telephone note. Thank you

## 2021-04-26 ENCOUNTER — Telehealth: Payer: Self-pay | Admitting: Emergency Medicine

## 2021-04-26 NOTE — Telephone Encounter (Signed)
Tried to call patient and patient's emergency contact several times through out the day. Call could no be completed. Patient needs appointment.

## 2021-05-01 LAB — BLOOD GAS, VENOUS
Acid-Base Excess: 1.4 mmol/L (ref 0.0–2.0)
Bicarbonate: 28.9 mmol/L — ABNORMAL HIGH (ref 20.0–28.0)
O2 Saturation: 46.5 %
Patient temperature: 37
pCO2, Ven: 56 mmHg (ref 44.0–60.0)
pH, Ven: 7.32 (ref 7.250–7.430)

## 2021-05-08 ENCOUNTER — Telehealth: Payer: Self-pay | Admitting: Emergency Medicine

## 2021-05-08 NOTE — Telephone Encounter (Signed)
Tried to call patient to schedule an appointment. Call could not be completed.

## 2021-05-10 ENCOUNTER — Other Ambulatory Visit: Payer: Self-pay

## 2021-05-10 ENCOUNTER — Emergency Department
Admission: EM | Admit: 2021-05-10 | Discharge: 2021-05-10 | Disposition: A | Discharge status: Home / Self Care | Payer: Medicaid Other | Attending: Emergency Medicine | Admitting: Emergency Medicine

## 2021-05-10 ENCOUNTER — Encounter: Payer: Self-pay | Admitting: Emergency Medicine

## 2021-05-10 ENCOUNTER — Inpatient Hospital Stay
Admission: EM | Admit: 2021-05-10 | Discharge: 2021-05-11 | DRG: 638 | Disposition: A | Discharge status: Home / Self Care | Payer: Medicaid Other | Attending: Internal Medicine | Admitting: Internal Medicine

## 2021-05-10 DIAGNOSIS — E876 Hypokalemia: Secondary | ICD-10-CM | POA: Diagnosis present

## 2021-05-10 DIAGNOSIS — Z885 Allergy status to narcotic agent status: Secondary | ICD-10-CM

## 2021-05-10 DIAGNOSIS — E1010 Type 1 diabetes mellitus with ketoacidosis without coma: Secondary | ICD-10-CM

## 2021-05-10 DIAGNOSIS — K219 Gastro-esophageal reflux disease without esophagitis: Secondary | ICD-10-CM | POA: Diagnosis present

## 2021-05-10 DIAGNOSIS — Z87892 Personal history of anaphylaxis: Secondary | ICD-10-CM

## 2021-05-10 DIAGNOSIS — Z6822 Body mass index (BMI) 22.0-22.9, adult: Secondary | ICD-10-CM

## 2021-05-10 DIAGNOSIS — Z888 Allergy status to other drugs, medicaments and biological substances status: Secondary | ICD-10-CM

## 2021-05-10 DIAGNOSIS — Z794 Long term (current) use of insulin: Secondary | ICD-10-CM

## 2021-05-10 DIAGNOSIS — E875 Hyperkalemia: Secondary | ICD-10-CM | POA: Diagnosis present

## 2021-05-10 DIAGNOSIS — Z5321 Procedure and treatment not carried out due to patient leaving prior to being seen by health care provider: Secondary | ICD-10-CM | POA: Insufficient documentation

## 2021-05-10 DIAGNOSIS — Z79899 Other long term (current) drug therapy: Secondary | ICD-10-CM

## 2021-05-10 DIAGNOSIS — I10 Essential (primary) hypertension: Secondary | ICD-10-CM | POA: Diagnosis present

## 2021-05-10 DIAGNOSIS — E871 Hypo-osmolality and hyponatremia: Secondary | ICD-10-CM | POA: Diagnosis present

## 2021-05-10 DIAGNOSIS — E1042 Type 1 diabetes mellitus with diabetic polyneuropathy: Secondary | ICD-10-CM | POA: Diagnosis present

## 2021-05-10 DIAGNOSIS — B1920 Unspecified viral hepatitis C without hepatic coma: Secondary | ICD-10-CM | POA: Diagnosis present

## 2021-05-10 DIAGNOSIS — E1100 Type 2 diabetes mellitus with hyperosmolarity without nonketotic hyperglycemic-hyperosmolar coma (NKHHC): Secondary | ICD-10-CM

## 2021-05-10 DIAGNOSIS — I252 Old myocardial infarction: Secondary | ICD-10-CM

## 2021-05-10 DIAGNOSIS — Z8249 Family history of ischemic heart disease and other diseases of the circulatory system: Secondary | ICD-10-CM

## 2021-05-10 DIAGNOSIS — F1990 Other psychoactive substance use, unspecified, uncomplicated: Secondary | ICD-10-CM | POA: Diagnosis present

## 2021-05-10 DIAGNOSIS — Z720 Tobacco use: Secondary | ICD-10-CM | POA: Diagnosis present

## 2021-05-10 DIAGNOSIS — E109 Type 1 diabetes mellitus without complications: Secondary | ICD-10-CM | POA: Diagnosis present

## 2021-05-10 DIAGNOSIS — Z91030 Bee allergy status: Secondary | ICD-10-CM

## 2021-05-10 DIAGNOSIS — Z8619 Personal history of other infectious and parasitic diseases: Secondary | ICD-10-CM | POA: Diagnosis present

## 2021-05-10 DIAGNOSIS — R112 Nausea with vomiting, unspecified: Secondary | ICD-10-CM

## 2021-05-10 DIAGNOSIS — Z20822 Contact with and (suspected) exposure to covid-19: Secondary | ICD-10-CM | POA: Diagnosis present

## 2021-05-10 DIAGNOSIS — K047 Periapical abscess without sinus: Secondary | ICD-10-CM | POA: Diagnosis present

## 2021-05-10 DIAGNOSIS — K029 Dental caries, unspecified: Secondary | ICD-10-CM | POA: Diagnosis present

## 2021-05-10 DIAGNOSIS — K0510 Chronic gingivitis, plaque induced: Secondary | ICD-10-CM | POA: Diagnosis present

## 2021-05-10 DIAGNOSIS — R1084 Generalized abdominal pain: Secondary | ICD-10-CM | POA: Insufficient documentation

## 2021-05-10 DIAGNOSIS — R197 Diarrhea, unspecified: Secondary | ICD-10-CM

## 2021-05-10 DIAGNOSIS — F1110 Opioid abuse, uncomplicated: Secondary | ICD-10-CM | POA: Diagnosis present

## 2021-05-10 DIAGNOSIS — E1065 Type 1 diabetes mellitus with hyperglycemia: Principal | ICD-10-CM | POA: Diagnosis present

## 2021-05-10 DIAGNOSIS — F1410 Cocaine abuse, uncomplicated: Secondary | ICD-10-CM | POA: Diagnosis present

## 2021-05-10 DIAGNOSIS — Z833 Family history of diabetes mellitus: Secondary | ICD-10-CM

## 2021-05-10 DIAGNOSIS — F1910 Other psychoactive substance abuse, uncomplicated: Secondary | ICD-10-CM | POA: Diagnosis present

## 2021-05-10 DIAGNOSIS — Z881 Allergy status to other antibiotic agents status: Secondary | ICD-10-CM

## 2021-05-10 DIAGNOSIS — F17210 Nicotine dependence, cigarettes, uncomplicated: Secondary | ICD-10-CM | POA: Diagnosis present

## 2021-05-10 DIAGNOSIS — E46 Unspecified protein-calorie malnutrition: Secondary | ICD-10-CM | POA: Diagnosis present

## 2021-05-10 LAB — URINALYSIS, ROUTINE W REFLEX MICROSCOPIC
Bacteria, UA: NONE SEEN
Bilirubin Urine: NEGATIVE
Glucose, UA: >=500 mg/dL — AB
Hgb urine dipstick: NEGATIVE
Ketones, ur: 5 mg/dL — AB
Leukocytes,Ua: NEGATIVE
Nitrite: NEGATIVE
Protein, ur: NEGATIVE mg/dL
Specific Gravity, Urine: 1.028 (ref 1.005–1.030)
Squamous Epithelial / HPF: NONE SEEN (ref 0–5)
WBC, UA: NONE SEEN WBC/hpf (ref 0–5)
pH: 6 (ref 5.0–8.0)

## 2021-05-10 LAB — CBC
HCT: 44.5 % (ref 39.0–52.0)
Hemoglobin: 14.7 g/dL (ref 13.0–17.0)
MCH: 30.4 pg (ref 26.0–34.0)
MCHC: 33 g/dL (ref 30.0–36.0)
MCV: 91.9 fL (ref 80.0–100.0)
Platelets: 259 10*3/uL (ref 150–400)
RBC: 4.84 MIL/uL (ref 4.22–5.81)
RDW: 13.1 % (ref 11.5–15.5)
WBC: 10.8 10*3/uL — ABNORMAL HIGH (ref 4.0–10.5)
nRBC: 0 % (ref 0.0–0.2)

## 2021-05-10 LAB — BASIC METABOLIC PANEL
Anion gap: 14 (ref 5–15)
BUN: 26 mg/dL — ABNORMAL HIGH (ref 6–20)
CO2: 24 mmol/L (ref 22–32)
Calcium: 9.5 mg/dL (ref 8.9–10.3)
Chloride: 83 mmol/L — ABNORMAL LOW (ref 98–111)
Creatinine, Ser: 1.45 mg/dL — ABNORMAL HIGH (ref 0.61–1.24)
GFR, Estimated: >60 mL/min (ref >60)
Glucose, Bld: 1192 mg/dL (ref 70–99)
Potassium: 6 mmol/L — ABNORMAL HIGH (ref 3.5–5.1)
Sodium: 121 mmol/L — ABNORMAL LOW (ref 135–145)

## 2021-05-10 LAB — CBG MONITORING, ED: Glucose-Capillary: >600 mg/dL (ref 70–99)

## 2021-05-10 MED ORDER — LACTATED RINGERS IV SOLN: INTRAVENOUS | Status: DC

## 2021-05-10 MED ORDER — DEXTROSE 50 % IV SOLN: 0.0000 mL | INTRAVENOUS | Status: DC | PRN

## 2021-05-10 MED ORDER — ONDANSETRON HCL 4 MG/2ML IJ SOLN
4.0000 mg | Freq: Once | INTRAMUSCULAR | Status: AC
  Administered 2021-05-11: 4 mg via INTRAVENOUS
  Filled 2021-05-10: qty 2

## 2021-05-10 MED ORDER — CLINDAMYCIN PHOSPHATE 300 MG/50ML IV SOLN
300.0000 mg | Freq: Once | INTRAVENOUS | Status: AC
  Administered 2021-05-11: 300 mg via INTRAVENOUS
  Filled 2021-05-10: qty 50

## 2021-05-10 MED ORDER — LACTATED RINGERS IV BOLUS
30.0000 mL/kg | Freq: Once | INTRAVENOUS | Status: AC
  Administered 2021-05-11: 1770 mL via INTRAVENOUS

## 2021-05-10 MED ORDER — LACTATED RINGERS IV BOLUS: 20.0000 mL/kg | Freq: Once | INTRAVENOUS | Status: DC

## 2021-05-10 MED ORDER — DEXTROSE IN LACTATED RINGERS 5 % IV SOLN: INTRAVENOUS | Status: DC

## 2021-05-10 MED ORDER — FENTANYL CITRATE PF 50 MCG/ML IJ SOSY
50.0000 ug | PREFILLED_SYRINGE | Freq: Once | INTRAMUSCULAR | Status: AC
  Administered 2021-05-11: 50 ug via INTRAVENOUS
  Filled 2021-05-10: qty 1

## 2021-05-10 MED ORDER — INSULIN REGULAR(HUMAN) IN NACL 100-0.9 UT/100ML-% IV SOLN
INTRAVENOUS | Status: DC
  Filled 2021-05-10: qty 100

## 2021-05-10 NOTE — ED Provider Notes (Addendum)
[facility] Emergency Department Provider Note  ____________________________________________   Event Date/Time   First MD Initiated Contact with Patient 05/10/21 2334     (approximate)  I have reviewed the triage vital signs and the nursing notes.   HISTORY  Chief Complaint Hyperglycemia   HPI Trevor Brown is a 30 y.o. male no past medical history of type 1 diabetes, HTN and previous substance abuse complicated by hep C prior to IV drug use not currently treated who presents for assessment approximately 2 days of general malaise, some epigastric crampy abdominal pain and some nonbloody nonbilious nausea and vomiting.  Patient states he has also had some soreness in his right upper gum over this time and thinks he may have had a tooth abscess.  He has not had any recent falls or injuries and denies any recent illicit drug use or EtOH use.  Denies any fevers, cough, shortness of breath, chest pain, burning with urination, blood in stool or urine, rash or extremity pain.  No other acute concerns at this time.  No recent injuries.         Past Medical History:  Diagnosis Date   Heart attack ([place])    Pt claims he had heart attack a year ago ( 2015)- and was admitted in [facility] for that, but not given any meds or angiogram, on review of chart- I could not find any details like that.   Hepatitis C, acute may 2016   Heroin abuse ([place])    History of noncompliance with medical treatment    Hypertension    Type 1 diabetes [facility])     Patient Active Problem List   Diagnosis Date Noted   Non compliance w medication regimen 04/19/2021   HTN (hypertension) 01/05/2021   GERD (gastroesophageal reflux disease) 01/05/2021   SIRS (systemic inflammatory response syndrome) ([place]) 03/03/2020   Cellulitis of left hand 03/03/2020   Elevated blood pressure reading 01/18/2020   Hypokalemia    Sepsis without acute organ dysfunction ([place])    Dental abscess 09/12/2019    Intractable vomiting    Hematemesis with nausea    Cannabis hyperemesis syndrome concurrent with and due to cannabis abuse ([place])    Elevated LFTs    Abnormal laboratory test result 07/20/2019   Smoking 07/20/2019   History of hepatitis C 02/25/2019   Health care maintenance 02/25/2019   Peripheral neuropathy 02/25/2019   Hand abscess 10/13/2018   IVDU (intravenous drug user) 10/11/2018   Hyperkalemia 10/11/2018   Cellulitis and abscess of hand 08/17/2018   Polysubstance abuse ([place]) 01/20/2018   Abdominal pain 06/29/2017   Hyponatremia 06/29/2017   DKA (diabetic ketoacidosis) ([place]) 06/29/2017   Dental erosion extending into pulp 12/31/2016   MRSA carrier 07/05/2016   Gastroenteritis 04/11/2016   Elevated transaminase level 03/27/2016   Diabetes mellitus type 1 ([place]) 03/27/2016   Tobacco abuse counseling 03/27/2016   Transaminitis 03/25/2016   Leukocytosis 01/13/2016   Cannabinoid hyperemesis syndrome 01/10/2016   Tobacco abuse 01/10/2016   Type 1 diabetes mellitus with hyperglycemia ([place]) 12/12/2015   Chest pain 12/12/2015   Cocaine abuse ([place]) 12/12/2015   DKA (diabetic ketoacidoses) 06/10/2015   Hidradenitis suppurativa of left axilla    DKA, type 1 ([place]) 06/06/2015   Malnutrition of moderate degree ([place]) 04/08/2015   Hepatitis C 12/13/2014   Diabetes type 1, uncontrolled 12/11/2014   Major depressive disorder, single episode, mild (HCC)    Major depression, single episode 12/10/2014   Diabetes mellitus type 1, uncontrolled  12/09/2014   Homelessness 12/09/2014    Past Surgical History:  Procedure Laterality Date   INCISION AND DRAINAGE ABSCESS Left 03/04/2020   Procedure: INCISION AND DRAINAGE ABSCESS;  Surgeon: Corky Mull, MD;  Location: [facility] ORS;  Service: Orthopedics;  Laterality: Left;   NO PAST SURGERIES     none      Prior to Admission medications   Medication Sig Start Date End Date Taking? Authorizing Provider  blood glucose meter kit and supplies KIT  USE AS DIRECTED UP TO 4 TIMES DAILY. 04/18/21   Harvest Dark, MD  gabapentin (NEURONTIN) 400 MG capsule Take 1 capsule (400 mg total) by mouth 3 (three) times daily. Patient not taking: Reported on 04/19/2021 04/19/21 05/23/21  Iloabachie, [name], NP  glucose blood (FREESTYLE LITE) test strip To be used as directed for checking her blood sugar 01/08/21   Sreenath, Sudheer B, MD  glucose blood (RIGHTEST GS550 BLOOD GLUCOSE) test strip USE AS DIRECTED UP TO 4 TIMES DAILY. 04/19/21   Cammy Copa, RPH  Insulin Glargine (BASAGLAR KWIKPEN) 100 UNIT/ML Inject 30 Units into the skin 2 (two) times daily. 01/08/21   Sreenath, Sudheer B, MD  insulin lispro (HUMALOG KWIKPEN) 100 UNIT/ML KwikPen INJECT 5-10 UNITS INTO THE SKIN 3 TIMES DAILY BEFORE MEALS PER SLIDING SCALE. IF BLOOD SUGAR IS 120-250=5 UNITS;251-350=8 UNITS; OVER 350=10 UNITS 04/19/21   Iloabachie, [name], NP  Insulin Pen Needle 32G X 4 MM MISC USE AS DIRECTED WITH INSULIN PENS. 01/08/21   Ellie Lunch K, RPH  Rightest GL300 Lancets MISC USE AS DIRECTED UP TO 4 TIMES DAILY. 04/19/21   Cammy Copa, RPH    Allergies Bee venom, Ibuprofen, Tramadol, and Vancomycin  Family History  Problem Relation Age of Onset   Cirrhosis Mother    Diabetes Mellitus II Maternal Grandmother    Diabetes Mellitus II Maternal Grandfather    CAD Father     Social History Social History   Tobacco Use   Smoking status: Every Day    Packs/day: 2.00    Types: Cigarettes   Smokeless tobacco: Never  Vaping Use   Vaping Use: Never used  Substance Use Topics   Alcohol use: No    Alcohol/week: 0.0 standard drinks   Drug use: Not Currently    Types: IV, Heroin, Marijuana    Comment: heroin-     Review of Systems  Review of Systems  Constitutional:  Positive for malaise/fatigue. Negative for chills and fever.  HENT:  Negative for sore throat.   Eyes:  Negative for pain.  Respiratory:  Negative for cough and stridor.   Cardiovascular:  Negative  for chest pain.  Gastrointestinal:  Positive for abdominal pain, diarrhea, nausea and vomiting.  Skin:  Negative for rash.  Neurological:  Positive for weakness and headaches. Negative for seizures and loss of consciousness.  Psychiatric/Behavioral:  Negative for suicidal ideas.   All other systems reviewed and are negative.    ____________________________________________   PHYSICAL EXAM:  VITAL SIGNS: ED Triage Vitals  Enc Vitals Group     BP 05/10/21 2125 130/85     Pulse Rate 05/10/21 2125 92     Resp 05/10/21 2125 18     Temp 05/10/21 2125 98.5 F (36.9 C)     Temp Source 05/10/21 2125 Oral     SpO2 05/10/21 2125 99 %     Weight 05/10/21 2125 130 lb (59 kg)     Height 05/10/21 2125 '5\' 4"'  (1.626 m)  Head Circumference --      Peak Flow --      Pain Score 05/10/21 2129 9     Pain Loc --      Pain Edu? --      Excl. in [place]? --    Vitals:   05/10/21 2125  BP: 130/85  Pulse: 92  Resp: 18  Temp: 98.5 F (36.9 C)  SpO2: 99%   Physical Exam Vitals and nursing note reviewed.  Constitutional:      Appearance: He is well-developed. He is ill-appearing.  HENT:     Head: Normocephalic and atraumatic.     Right Ear: External ear normal.     Left Ear: External ear normal.     Nose: Nose normal.     Mouth/Throat:     Mouth: Mucous membranes are dry.  Eyes:     Conjunctiva/sclera: Conjunctivae normal.  Cardiovascular:     Rate and Rhythm: Normal rate and regular rhythm.     Heart sounds: No murmur heard. Pulmonary:     Effort: Pulmonary effort is normal. No respiratory distress.     Breath sounds: Normal breath sounds.  Abdominal:     Palpations: Abdomen is soft.     Tenderness: There is abdominal tenderness in the epigastric area. There is no right CVA tenderness or left CVA tenderness.  Musculoskeletal:     Cervical back: Neck supple.  Skin:    General: Skin is warm and dry.     Capillary Refill: Capillary refill takes more than 3 seconds.  Neurological:      Mental Status: He is alert.  Psychiatric:        Mood and Affect: Mood normal.    Caries diffuse throughout the superior and inferior teeth.  There are some edema over the lingual aspect of the superior lateral gingiva but no significant clear defined margin.  No significant edema on the buccal aspect.  Oropharynx is otherwise unremarkable.  No induration of the tongue.  Cranial nerves II to XII are grossly intact.  Patient forage motion of his neck. ____________________________________________   LABS (all labs ordered are listed, but only abnormal results are displayed)  Labs Reviewed  CBC - Abnormal; Notable for the following components:      Result Value   WBC 10.8 (*)    All other components within normal limits  URINALYSIS, ROUTINE W REFLEX MICROSCOPIC - Abnormal; Notable for the following components:   Color, Urine COLORLESS (*)    APPearance CLEAR (*)    Glucose, UA >=500 (*)    Ketones, ur 5 (*)    All other components within normal limits  BASIC METABOLIC PANEL - Abnormal; Notable for the following components:   Sodium 121 (*)    Potassium 6.0 (*)    Chloride 83 (*)    Glucose, Bld 1,192 (*)    BUN 26 (*)    Creatinine, Ser 1.45 (*)    All other components within normal limits  BETA-HYDROXYBUTYRIC ACID - Abnormal; Notable for the following components:   Beta-Hydroxybutyric Acid 1.61 (*)    All other components within normal limits  HEPATIC FUNCTION PANEL - Abnormal; Notable for the following components:   ALT 57 (*)    Alkaline Phosphatase 146 (*)    All other components within normal limits  CBG MONITORING, ED - Abnormal; Notable for the following components:   Glucose-Capillary >600 (*)    All other components within normal limits  RESP PANEL BY RT-PCR (FLU A&B, COVID) ARPGX2  LIPASE, BLOOD  BLOOD GAS, VENOUS  OSMOLALITY  URINE DRUG SCREEN, QUALITATIVE ([facility] ONLY)  CBG MONITORING, ED   ____________________________________________  EKG  ECG shows sinus  rhythm with ventricular rate of 54 with unremarkable intervals, normal axis and no clearance of acute ischemia or significant arrhythmia. ____________________________________________  RADIOLOGY  ED MD interpretation:    Official radiology report(s): No results found.  ____________________________________________   PROCEDURES  Procedure(s) performed (including Critical Care):  .Critical Care Performed by: Lucrezia Starch, MD Authorized by: Lucrezia Starch, MD   Critical care provider statement:    Critical care time (minutes):  45   Critical care was necessary to treat or prevent imminent or life-threatening deterioration of the following conditions:  Endocrine crisis and metabolic crisis   Critical care was time spent personally by me on the following activities:  Pulse oximetry, re-evaluation of patient's condition, review of old charts, examination of patient, ordering and review of laboratory studies, ordering and review of radiographic studies, ordering and performing treatments and interventions, obtaining history from patient or surrogate and development of treatment plan with patient or surrogate   Care discussed with: admitting provider     ____________________________________________   INITIAL IMPRESSION / ASSESSMENT AND PLAN / ED COURSE      Patient presents with above-stated history exam for assessment of achiness in his right superior posterior teeth and some nonbloody nonbilious vomiting, diarrhea and some crampy epigastric abdominal pain.  Patient states he thinks he is in possibly DKA sugars have been high.  Denies any recent illicit drug use and states he is compliant with medications.  On arrival he is afebrile hemodynamic stable.  He appears extremely dehydrated with mild epigastric discomfort on exam.  He also has what appears to be a very early dental abscess on the superior posterior lateral gingiva and diffuse dental caries without other obvious source of  disease infection of the head or neck.  With regard to patient's GI symptoms differential includes acute infectious gastroenteritis, pancreatitis, cholecystitis, acute hepatitis, metabolic derangements including DKA versus HHS and cystitis.  CBC shows WBC count of 10.8 without evidence of acute anemia and normal platelets.  UA shows greater than 500 glucose and 5 ketones without evidence of infection and no blood.  BMP remarkable for NA of 121, K of 6, chloride of 83 and a glucose of 1192 with a BUN of 26 and a creatinine of 1.5.  Anion gap is 14.  This represents an AKI as patient's last creatinine 2 weeks ago was 0.90.  While patient's bicarb and anion gap on the CMP do not suggest DKA will send VBG and beta hydroxybutyrate as well.  We will BMP does show pseudohyponatremia this actually corrects to 147 taking consideration patient's glucose and reflects patient's overall poor water deficit.  Hepatic function panel shows normal bilirubin and LFTs down from when I last checked 3 days ago and overall have a low suspicion for acute hepatitis or cholestasis.  Lipase of 24 not consistent with acute pancreatitis.  Overall picture is most consistent with GI symptoms related to HHS in the setting of likely early dental abscess.  Will give a dose of clinda given patient is feeling nauseous the emergency room and start patient on IV fluids and insulin drip.  I will admit to medicine service for further evaluation and management.         ____________________________________________   FINAL CLINICAL IMPRESSION(S) / ED DIAGNOSES  Final diagnoses:  Dental abscess  Dental caries  Hyperosmolar  hyperglycemic state (HHS) (HCC)  Nausea vomiting and diarrhea    Medications  fentaNYL (SUBLIMAZE) injection 50 mcg (has no administration in time range)  ondansetron (ZOFRAN) injection 4 mg (has no administration in time range)  insulin regular, human (MYXREDLIN) 100 units/ 100 mL infusion (has no  administration in time range)  lactated ringers infusion (has no administration in time range)  dextrose 5 % in lactated ringers infusion (has no administration in time range)  dextrose 50 % solution 0-50 mL (has no administration in time range)  lactated ringers bolus 1,770 mL (has no administration in time range)  clindamycin (CLEOCIN) IVPB 300 mg (has no administration in time range)  chlorhexidine (PERIDEX) 0.12 % solution 15 mL (has no administration in time range)     ED Discharge Orders     None        Note:  This document was prepared using Dragon voice recognition software and may include unintentional dictation errors.    Lucrezia Starch, MD 05/11/21 0020    Lucrezia Starch, MD 05/11/21 0100

## 2021-05-10 NOTE — ED Triage Notes (Signed)
Pt in with co hyperglycemia at home for 2 days. Pt states at home has been as hight as 230 but has been taking insulin. Pt is type 1 diabetic, things uncontrolled cbg could be due to abscessed tooth.

## 2021-05-10 NOTE — ED Triage Notes (Signed)
Pt to triage via w/c with no distress noted; pt reports FSBS reading "high"; st taking meds as rx; c/o generalized abd pain; pt is very argumentative and cursing; pt is upset that he has been brought to triage since he came in by EMS; explained to pt purpose of triage and protocols; pt cont to curse & yell; pt refusing to have protocols; st "you ain't poking me with no dam needle and putting me back in the lobby! I want you to take me to every room and show me there is pt in them!"; again explained to pt purpose of such but cont to curse; pt jumps up out of w/c rushes towards nurse sticking finger in face and cont to yell st "I'm gonna go out there and fall in the floor and then sue yall asses!"; security called to triage and pt taken to lobby to wait for further evaluation by a provider

## 2021-05-11 DIAGNOSIS — E1065 Type 1 diabetes mellitus with hyperglycemia: Secondary | ICD-10-CM

## 2021-05-11 LAB — BASIC METABOLIC PANEL
Anion gap: 7 (ref 5–15)
BUN: 27 mg/dL — ABNORMAL HIGH (ref 6–20)
CO2: 32 mmol/L (ref 22–32)
Calcium: 9.3 mg/dL (ref 8.9–10.3)
Chloride: 98 mmol/L (ref 98–111)
Creatinine, Ser: 0.97 mg/dL (ref 0.61–1.24)
GFR, Estimated: >60 mL/min (ref >60)
Glucose, Bld: 194 mg/dL — ABNORMAL HIGH (ref 70–99)
Potassium: 4 mmol/L (ref 3.5–5.1)
Sodium: 137 mmol/L (ref 135–145)

## 2021-05-11 LAB — CBC
HCT: 36.4 % — ABNORMAL LOW (ref 39.0–52.0)
Hemoglobin: 12.7 g/dL — ABNORMAL LOW (ref 13.0–17.0)
MCH: 30.1 pg (ref 26.0–34.0)
MCHC: 34.9 g/dL (ref 30.0–36.0)
MCV: 86.3 fL (ref 80.0–100.0)
Platelets: 254 10*3/uL (ref 150–400)
RBC: 4.22 MIL/uL (ref 4.22–5.81)
RDW: 12.9 % (ref 11.5–15.5)
WBC: 9.3 10*3/uL (ref 4.0–10.5)
nRBC: 0 % (ref 0.0–0.2)

## 2021-05-11 LAB — URINE DRUG SCREEN, QUALITATIVE (ARMC ONLY)
Amphetamines, Ur Screen: NOT DETECTED
Barbiturates, Ur Screen: NOT DETECTED
Benzodiazepine, Ur Scrn: NOT DETECTED
Cannabinoid 50 Ng, Ur ~~LOC~~: POSITIVE — AB
Cocaine Metabolite,Ur ~~LOC~~: NOT DETECTED
MDMA (Ecstasy)Ur Screen: NOT DETECTED
Methadone Scn, Ur: NOT DETECTED
Opiate, Ur Screen: NOT DETECTED
Phencyclidine (PCP) Ur S: NOT DETECTED
Tricyclic, Ur Screen: NOT DETECTED

## 2021-05-11 LAB — RESP PANEL BY RT-PCR (FLU A&B, COVID) ARPGX2
Influenza A by PCR: NEGATIVE
Influenza B by PCR: NEGATIVE
SARS Coronavirus 2 by RT PCR: NEGATIVE

## 2021-05-11 LAB — HEMOGLOBIN A1C
Hgb A1c MFr Bld: 11.8 % — ABNORMAL HIGH (ref 4.8–5.6)
Mean Plasma Glucose: 292 mg/dL

## 2021-05-11 LAB — HEPATIC FUNCTION PANEL
ALT: 57 U/L — ABNORMAL HIGH (ref 0–44)
AST: 36 U/L (ref 15–41)
Albumin: 4 g/dL (ref 3.5–5.0)
Alkaline Phosphatase: 146 U/L — ABNORMAL HIGH (ref 38–126)
Bilirubin, Direct: 0.1 mg/dL (ref 0.0–0.2)
Indirect Bilirubin: 0.7 mg/dL (ref 0.3–0.9)
Total Bilirubin: 0.8 mg/dL (ref 0.3–1.2)
Total Protein: 7.7 g/dL (ref 6.5–8.1)

## 2021-05-11 LAB — BLOOD GAS, VENOUS
Acid-Base Excess: 9.6 mmol/L — ABNORMAL HIGH (ref 0.0–2.0)
Bicarbonate: 37.3 mmol/L — ABNORMAL HIGH (ref 20.0–28.0)
O2 Saturation: 53.6 %
Patient temperature: 37
pCO2, Ven: 63 mmHg — ABNORMAL HIGH (ref 44.0–60.0)
pH, Ven: 7.38 (ref 7.250–7.430)
pO2, Ven: <31 mmHg — CL (ref 32.0–45.0)

## 2021-05-11 LAB — LIPASE, BLOOD: Lipase: 24 U/L (ref 11–51)

## 2021-05-11 LAB — CBG MONITORING, ED
Glucose-Capillary: >600 mg/dL (ref 70–99)
Glucose-Capillary: >600 mg/dL (ref 70–99)
Glucose-Capillary: 371 mg/dL — ABNORMAL HIGH (ref 70–99)
Glucose-Capillary: 474 mg/dL — ABNORMAL HIGH (ref 70–99)
Glucose-Capillary: 320 mg/dL — ABNORMAL HIGH (ref 70–99)
Glucose-Capillary: 232 mg/dL — ABNORMAL HIGH (ref 70–99)
Glucose-Capillary: 200 mg/dL — ABNORMAL HIGH (ref 70–99)
Glucose-Capillary: 169 mg/dL — ABNORMAL HIGH (ref 70–99)
Glucose-Capillary: 116 mg/dL — ABNORMAL HIGH (ref 70–99)
Glucose-Capillary: 235 mg/dL — ABNORMAL HIGH (ref 70–99)
Glucose-Capillary: 289 mg/dL — ABNORMAL HIGH (ref 70–99)

## 2021-05-11 LAB — OSMOLALITY
Osmolality: 338 mOsm/kg (ref 275–295)
Osmolality: 299 mOsm/kg — ABNORMAL HIGH (ref 275–295)

## 2021-05-11 LAB — BETA-HYDROXYBUTYRIC ACID: Beta-Hydroxybutyric Acid: 1.61 mmol/L — ABNORMAL HIGH (ref 0.05–0.27)

## 2021-05-11 MED ORDER — FENTANYL CITRATE PF 50 MCG/ML IJ SOSY
50.0000 ug | PREFILLED_SYRINGE | INTRAMUSCULAR | Status: DC | PRN
  Administered 2021-05-11 (×3): 50 ug via INTRAVENOUS
  Filled 2021-05-11 (×3): qty 1

## 2021-05-11 MED ORDER — CLINDAMYCIN PHOSPHATE 300 MG/50ML IV SOLN
300.0000 mg | Freq: Three times a day (TID) | INTRAVENOUS | Status: DC
  Administered 2021-05-11: 300 mg via INTRAVENOUS
  Filled 2021-05-11 (×3): qty 50

## 2021-05-11 MED ORDER — BASAGLAR KWIKPEN 100 UNIT/ML ~~LOC~~ SOPN: 30.0000 [IU] | PEN_INJECTOR | Freq: Two times a day (BID) | SUBCUTANEOUS | 10 refills | Status: DC

## 2021-05-11 MED ORDER — CHLORHEXIDINE GLUCONATE 0.12 % MT SOLN: 15.0000 mL | Freq: Two times a day (BID) | OROMUCOSAL | Status: DC

## 2021-05-11 MED ORDER — CLINDAMYCIN HCL 150 MG PO CAPS: 450.0000 mg | ORAL_CAPSULE | Freq: Three times a day (TID) | ORAL | 0 refills | Status: AC

## 2021-05-11 MED ORDER — DEXTROSE IN LACTATED RINGERS 5 % IV SOLN: INTRAVENOUS | Status: DC

## 2021-05-11 MED ORDER — ACETAMINOPHEN 325 MG PO TABS: 650.0000 mg | ORAL_TABLET | Freq: Four times a day (QID) | ORAL | Status: DC | PRN

## 2021-05-11 MED ORDER — HYDROCODONE-ACETAMINOPHEN 5-325 MG PO TABS: 1.0000 | ORAL_TABLET | Freq: Three times a day (TID) | ORAL | 0 refills | Status: DC | PRN

## 2021-05-11 MED ORDER — DEXTROSE 50 % IV SOLN: 0.0000 mL | INTRAVENOUS | Status: DC | PRN

## 2021-05-11 MED ORDER — HYDROCODONE-ACETAMINOPHEN 5-325 MG PO TABS
1.0000 | ORAL_TABLET | Freq: Four times a day (QID) | ORAL | Status: DC | PRN
  Administered 2021-05-11: 1 via ORAL
  Filled 2021-05-11: qty 1

## 2021-05-11 MED ORDER — LACTATED RINGERS IV SOLN: INTRAVENOUS | Status: DC

## 2021-05-11 MED ORDER — CLINDAMYCIN HCL 150 MG PO CAPS
450.0000 mg | ORAL_CAPSULE | Freq: Three times a day (TID) | ORAL | Status: DC
  Administered 2021-05-11: 450 mg via ORAL
  Filled 2021-05-11: qty 3

## 2021-05-11 MED ORDER — LACTATED RINGERS IV BOLUS: 20.0000 mL/kg | Freq: Once | INTRAVENOUS | Status: DC

## 2021-05-11 MED ORDER — HEPARIN SODIUM (PORCINE) 5000 UNIT/ML IJ SOLN: 5000.0000 [IU] | Freq: Three times a day (TID) | INTRAMUSCULAR | Status: DC

## 2021-05-11 MED ORDER — CHLORHEXIDINE GLUCONATE 0.12 % MT SOLN: 15.0000 mL | Freq: Two times a day (BID) | OROMUCOSAL | 0 refills | Status: DC

## 2021-05-11 MED ORDER — INSULIN GLARGINE-YFGN 100 UNIT/ML ~~LOC~~ SOLN
30.0000 [IU] | Freq: Two times a day (BID) | SUBCUTANEOUS | Status: DC
  Administered 2021-05-11: 30 [IU] via SUBCUTANEOUS
  Filled 2021-05-11 (×2): qty 0.3

## 2021-05-11 MED ORDER — INSULIN REGULAR(HUMAN) IN NACL 100-0.9 UT/100ML-% IV SOLN
INTRAVENOUS | Status: DC
  Administered 2021-05-11: 7 [IU]/h via INTRAVENOUS

## 2021-05-11 MED ORDER — NICOTINE 21 MG/24HR TD PT24
21.0000 mg | MEDICATED_PATCH | Freq: Every day | TRANSDERMAL | Status: DC
  Administered 2021-05-11: 21 mg via TRANSDERMAL
  Filled 2021-05-11: qty 1

## 2021-05-11 NOTE — ED Notes (Signed)
Waiting for diabetic meal tray for pt. To proceed with semglee and discharge.

## 2021-05-11 NOTE — Progress Notes (Signed)
Inpatient Diabetes Program Recommendations  AACE/ADA: New Consensus Statement on Inpatient Glycemic Control (2015)  Target Ranges:  Prepandial:   less than 140 mg/dL      Peak postprandial:   less than 180 mg/dL (1-2 hours)      Critically ill patients:  140 - 180 mg/dL   Lab Results  Component Value Date   GLUCAP 116 (H) 05/11/2021   HGBA1C 10.7 (A) 12/28/2020   HGBA1C 10.7 12/28/2020   HGBA1C 0.0 (A) 12/28/2020   HGBA1C 0.0 12/28/2020    Review of Glycemic Control  Diabetes history: DM type 1 Outpatient Diabetes medications: Basaglar 30 units bid, Humalog 5-10 units tid Current orders for Inpatient glycemic control:  IV insulin Endotool  Patient well known to the Inpatient Diabetes team--Multiple ED visits and admissions since 2020   PCP--[facility] Last seen 12/28/2020- Advised to Continue to take Lantus and Humalog as prescribed  Inpatient Diabetes Program Recommendations:    At time of transition consider:  -  Semglee 25 units bid -  Novolog 0-15 units tid + hs -  Novolog 4 units tid meal coverage if eating >50% of meals  Thanks,  Christena Deem RN, MSN, BC-ADM Inpatient Diabetes Coordinator Team Pager [phone] (8a-5p)

## 2021-05-11 NOTE — H&P (Signed)
History and Physical   Trevor Brown:[id number] DOB: 1990/09/20 DOA: 05/10/2021  Referring MD/NP/PA: Dr. Tamala Julian  PCP: Trevor Reusing, NP   Outpatient Specialists: None   Patient coming from: Home  Chief Complaint: Dental pain  HPI: Trevor Brown is a 30 y.o. male with medical history significant of type 1 diabetes with noncompliance, polysubstance abuse including cocaine and tobacco and heroin, history of otitis C, essential hypertension, GERD, homelessness as well as dental abscesses who is seen in the ER today with pain in his mouth, generalized malaise and fever over the last 2 days.  Patient was seen and evaluated.  Noted to have blood sugar of more than 8000.  He has documented noncompliance in the past.  Other work-up indicated more HHS than DKA.  His bicarb is 24.  Patient also has what appears to be swollen ground.  He was recently here in the hospital admitted on 929 with similar problems.  At this point suspected to have hyperosmolar hyper glycemia with his tooth abscess.  Being admitted for further evaluation and treatment..  ED Course: Temperature 98.5, blood pressure is 143/98, pulse 92 respiratory rate of 18 oxygen sat 99% on room air.  White count 10.8 hemoglobin is 14.7 platelets 259.  Sodium 121 potassium 6.0 chloride 83 CO2 24 with BUN 26 creatinine 1.45 calcium 9.5.  Alkaline phosphatase 146.  Beta hydroxybutyric acid 1.61.  Urinalysis showed glucosuria otherwise no other findings.  Patient being admitted with hyperosmolar hyperglycemia and dental abscess  Review of Systems: As per HPI otherwise 10 point review of systems negative.    Past Medical History:  Diagnosis Date   Heart attack ([place])    Pt claims he had heart attack a year ago ( 2015)- and was admitted in [facility] for that, but not given any meds or angiogram, on review of chart- I could not find any details like that.   Hepatitis C, acute may 2016   Heroin abuse ([place])    History of noncompliance with  medical treatment    Hypertension    Type 1 diabetes [facility])     Past Surgical History:  Procedure Laterality Date   INCISION AND DRAINAGE ABSCESS Left 03/04/2020   Procedure: INCISION AND DRAINAGE ABSCESS;  Surgeon: Corky Mull, MD;  Location: [facility] ORS;  Service: Orthopedics;  Laterality: Left;   NO PAST SURGERIES     none       reports that he has been smoking cigarettes. He has been smoking an average of 2 packs per day. He has never used smokeless tobacco. He reports that he does not currently use drugs after having used the following drugs: IV, Heroin, and Marijuana. He reports that he does not drink alcohol.  Allergies  Allergen Reactions   Bee Venom Anaphylaxis   Ibuprofen Itching, Rash and Other (See Comments)    Other reaction(s): Other (See Comments) Stomach upset Reaction:  GI upset    Tramadol Hives and Swelling   Vancomycin Rash and Other (See Comments)    Reaction:  Red man's syndrome     Family History  Problem Relation Age of Onset   Cirrhosis Mother    Diabetes Mellitus II Maternal Grandmother    Diabetes Mellitus II Maternal Grandfather    CAD Father      Prior to Admission medications   Medication Sig Start Date End Date Taking? Authorizing Provider  blood glucose meter kit and supplies KIT USE AS DIRECTED UP TO 4 TIMES DAILY. 04/18/21  Harvest Dark, MD  gabapentin (NEURONTIN) 400 MG capsule Take 1 capsule (400 mg total) by mouth 3 (three) times daily. Patient not taking: Reported on 04/19/2021 04/19/21 05/23/21  Iloabachie, [name], NP  glucose blood (FREESTYLE LITE) test strip To be used as directed for checking her blood sugar 01/08/21   Sreenath, Sudheer B, MD  glucose blood (RIGHTEST GS550 BLOOD GLUCOSE) test strip USE AS DIRECTED UP TO 4 TIMES DAILY. 04/19/21   Cammy Copa, RPH  Insulin Glargine (BASAGLAR KWIKPEN) 100 UNIT/ML Inject 30 Units into the skin 2 (two) times daily. 01/08/21   Sreenath, Sudheer B, MD  insulin lispro (HUMALOG KWIKPEN)  100 UNIT/ML KwikPen INJECT 5-10 UNITS INTO THE SKIN 3 TIMES DAILY BEFORE MEALS PER SLIDING SCALE. IF BLOOD SUGAR IS 120-250=5 UNITS;251-350=8 UNITS; OVER 350=10 UNITS 04/19/21   Iloabachie, [name], NP  Insulin Pen Needle 32G X 4 MM MISC USE AS DIRECTED WITH INSULIN PENS. 01/08/21   Ellie Lunch K, RPH  Rightest GL300 Lancets MISC USE AS DIRECTED UP TO 4 TIMES DAILY. 04/19/21   Cammy Copa, [facility]    Physical Exam: Vitals:   05/10/21 2125 05/10/21 2129  BP: 130/85   Pulse: 92   Resp: 18   Temp: 98.5 F (36.9 C)   TempSrc: Oral   SpO2: 99%   Weight: 59 kg 59 kg  Height: _0  (1.626 m) _1  (1.626 m)      Constitutional: Chronically ill looking, malnourished, no distress Vitals:   05/10/21 2125 05/10/21 2129  BP: 130/85   Pulse: 92   Resp: 18   Temp: 98.5 F (36.9 C)   TempSrc: Oral   SpO2: 99%   Weight: 59 kg 59 kg  Height: _2  (1.626 m) _3  (1.626 m)   Eyes: PERRL, lids and conjunctivae normal ENMT: Mucous membranes are dry posterior pharynx clear of any exudate or lesions.poor dentition, swollen dental gums, possible abscess Neck: normal, supple, no masses, no thyromegaly Respiratory: clear to auscultation bilaterally, no wheezing, no crackles. Normal respiratory effort. No accessory muscle use.  Cardiovascular: Regular rate and rhythm, no murmurs / rubs / gallops. No extremity edema. 2+ pedal pulses. No carotid bruits.  Abdomen: no tenderness, no masses palpated. No hepatosplenomegaly. Bowel sounds positive.  Musculoskeletal: no clubbing / cyanosis. No joint deformity upper and lower extremities. Good ROM, no contractures. Normal muscle tone.  Skin: no rashes, lesions, ulcers. No induration Neurologic: CN 2-12 grossly intact. Sensation intact, DTR normal. Strength 5/5 in all 4.  Psychiatric: Normal judgment and insight. Alert and oriented x 3. Normal mood.     Labs on Admission: I have personally reviewed following labs and imaging studies  CBC: Recent Labs   Lab 05/10/21 2132  WBC 10.8*  HGB 14.7  HCT 44.5  MCV 91.9  PLT 536   Basic Metabolic Panel: Recent Labs  Lab 05/10/21 2221  NA 121*  K 6.0*  CL 83*  CO2 24  GLUCOSE 1,192*  BUN 26*  CREATININE 1.45*  CALCIUM 9.5   GFR: Estimated Creatinine Clearance: 63.3 mL/min (A) (by C-G formula based on SCr of 1.45 mg/dL (H)). Liver Function Tests: Recent Labs  Lab 05/10/21 2221  AST 36  ALT 57*  ALKPHOS 146*  BILITOT 0.8  PROT 7.7  ALBUMIN 4.0   Recent Labs  Lab 05/10/21 2221  LIPASE 24   No results for input(s): AMMONIA in the last 168 hours. Coagulation Profile: No results for input(s): INR, PROTIME in the last 168 hours.  Cardiac Enzymes: No results for input(s): CKTOTAL, CKMB, CKMBINDEX, TROPONINI in the last 168 hours. BNP (last 3 results) No results for input(s): PROBNP in the last 8760 hours. HbA1C: No results for input(s): HGBA1C in the last 72 hours. CBG: Recent Labs  Lab 05/10/21 2132  GLUCAP >600*   Lipid Profile: No results for input(s): CHOL, HDL, LDLCALC, TRIG, CHOLHDL, LDLDIRECT in the last 72 hours. Thyroid Function Tests: No results for input(s): TSH, T4TOTAL, FREET4, T3FREE, THYROIDAB in the last 72 hours. Anemia Panel: No results for input(s): VITAMINB12, FOLATE, FERRITIN, TIBC, IRON, RETICCTPCT in the last 72 hours. Urine analysis:    Component Value Date/Time   COLORURINE COLORLESS (A) 05/10/2021 2132   APPEARANCEUR CLEAR (A) 05/10/2021 2132   APPEARANCEUR Clear 07/08/2019 1825   LABSPEC 1.028 05/10/2021 2132   LABSPEC 1.026 04/06/2014 0751   PHURINE 6.0 05/10/2021 2132   GLUCOSEU >=500 (A) 05/10/2021 2132   GLUCOSEU >=500 04/06/2014 0751   HGBUR NEGATIVE 05/10/2021 2132   BILIRUBINUR NEGATIVE 05/10/2021 2132   BILIRUBINUR Negative 07/08/2019 1825   BILIRUBINUR Negative 04/06/2014 0751   KETONESUR 5 (A) 05/10/2021 2132   PROTEINUR NEGATIVE 05/10/2021 2132   NITRITE NEGATIVE 05/10/2021 2132   LEUKOCYTESUR NEGATIVE 05/10/2021 2132    LEUKOCYTESUR Negative 04/06/2014 0751   Sepsis Labs: _0 (procalcitonin:4,lacticidven:4) )No results found for this or any previous visit (from the past 240 hour(s)).   Radiological Exams on Admission: No results found.    Assessment/Plan Principal Problem:   Hyperglycemia due to type 1 diabetes mellitus (HCC) Active Problems:   Tobacco abuse   Diabetes mellitus type 1 ([place])   Hyponatremia   IVDU (intravenous drug user)   Hyperkalemia   History of hepatitis C   Dental abscess   Polysubstance abuse ([place])   HTN (hypertension)   GERD (gastroesophageal reflux disease)     #1 hyperosmolar hyperglycemia: Patient will be admitted.  Will be placed on insulin drip.  Aggressive fluid resuscitation.  Follow the protocol for HHS.  Transition to his subcutaneous home dose insulin.  Counseled on compliance.  #2 dental abscess: Initiate IV clindamycin.  Continue treatment.  #3 polysubstance abuse: Chronic problem.  Patient uses multiple drugs.  Nicotine patch will be added and other supportive care.  #4 pseudohyponatremia: Secondary to hyperglycemia.  Monitor sodium level as he improves.  #5 hypokalemia: Most likely due to the HHS.  Continue with insulin drip and monitoring  #6 essential hypertension: Blood pressures controlled.  Continue monitoring  #7 GERD: PPIs when able  #8 history of hepatitis C: Follow outpatient with GI.  Alkaline phosphatase elevated   DVT prophylaxis: Heparin Code Status: Full code Family Communication: No family at bedside Disposition Plan: To be determined Consults called: None Admission status: Inpatient  Severity of Illness: The appropriate patient status for this patient is INPATIENT. Inpatient status is judged to be reasonable and necessary in order to provide the required intensity of service to ensure the patient's safety. The patient's presenting symptoms, physical exam findings, and initial radiographic and laboratory data in the  context of their chronic comorbidities is felt to place them at high risk for further clinical deterioration. Furthermore, it is not anticipated that the patient will be medically stable for discharge from the hospital within 2 midnights of admission.   * I certify that at the point of admission it is my clinical judgment that the patient will require inpatient hospital care spanning beyond 2 midnights from the point of admission due to high intensity of service, high risk  for further deterioration and high frequency of surveillance required.Barbette Merino MD [facility] Pager [phone]  If 7PM-7AM, please contact night-coverage [email or web address] Password [facility]  05/11/2021, 12:28 AM

## 2021-05-11 NOTE — ED Notes (Signed)
Collyn, RN d/c'd pt's dextrose and insulin drips per Dr. Jill Alexanders instruction at 1106.

## 2021-05-11 NOTE — Discharge Instructions (Signed)
Cont use mouth was for oral hygiene [facility] to [facility]. Trevor Brown can go to [facility] in [place] also Keep log of your sugars also

## 2021-05-11 NOTE — Discharge Summary (Signed)
Trevor Brown at [place] NAME: Trevor Brown    MR#:  [id number]  DATE OF BIRTH:  1990/12/22  DATE OF ADMISSION:  05/10/2021 ADMITTING PHYSICIAN: Trevor Reach, MD  DATE OF DISCHARGE: 05/11/2021  PRIMARY CARE PHYSICIAN: Trevor Reusing, NP    ADMISSION DIAGNOSIS:  Hyperglycemia due to type 1 diabetes mellitus ([place]) [E10.65]  DISCHARGE DIAGNOSIS:  HHNK in Type one diabetes dental infection/? Gingivitis  SECONDARY DIAGNOSIS:   Past Medical History:  Diagnosis Date   Heart attack ([place])    Pt claims he had heart attack a year ago ( 2015)- and was admitted in [facility] for that, but not given any meds or angiogram, on review of chart- I could not find any details like that.   Hepatitis C, acute may 2016   Heroin abuse ([place])    History of noncompliance with medical treatment    Hypertension    Type 1 diabetes [facility])     HOSPITAL COURSE:   Trevor Brown is a 30 y.o. male with medical history significant of type 1 diabetes with noncompliance, polysubstance abuse including cocaine and tobacco and heroin, history of otitis C, essential hypertension, GERD, homelessness as well as dental abscesses who is seen in the ER today with pain in his mouth, generalized malaise and fever over the last 2 days. He came in with sugar of more than 1100 patient did have some pain over the right gumline. He does not have teeth.  #1 hyperosmolar nonketotic  hyperglycemia:  --received IV insulin drip and Aggressive fluid resuscitation.  Follow the protocol for HHS.  Transitioned today to to his subcutaneous home dose insulin.  Counseled on compliance. -- Extra prescription for insulin [facility] to [facility] -- sugars are stable. Last sugar was 198. Patient tolerated PO diet. Nausea vomiting.   #2 dental/gumline infection --received  IV clindamycin.  Continue treatment with PO clindamycin for seven days. Prescription sent to med management clinic. Patient to  [facility] this was discussed with him.   #3 polysubstance abuse: Chronic problem.  Patient uses multiple drugs.  Nicotine patch will be added and other supportive care. -- Urine drug screen positive for cannabinoid   #4 pseudohyponatremia: Secondary to hyperglycemia.  Monitor sodium level as he improves. -- Sodium 137-- improved with improving and sugar   #5 hypokalemia: Most likely due to the HHS.   -- Improved with insulin drip -- potassium 4.0  #6 essential hypertension: Blood pressures controlled. Not on any home meds   #7 GERD: PPIs when able   #8 history of hepatitis C: Follow outpatient with GI.  Alkaline phosphatase elevated   overall hemodynamically stable will discharge patient to home with [facility].  TOC for discharge planning for home meds  CONSULTS OBTAINED:    DRUG ALLERGIES:   Allergies  Allergen Reactions   Bee Venom Anaphylaxis   Ibuprofen Itching, Rash and Other (See Comments)    Other reaction(s): Other (See Comments) Stomach upset Reaction:  GI upset    Tramadol Hives and Swelling   Vancomycin Rash and Other (See Comments)    Reaction:  Red man's syndrome     DISCHARGE MEDICATIONS:   Allergies as of 05/11/2021       Reactions   Bee Venom Anaphylaxis   Ibuprofen Itching, Rash, Other (See Comments)   Other reaction(s): Other (See Comments) Stomach upset Reaction:  GI upset  Tramadol Hives, Swelling   Vancomycin Rash, Other (See Comments)   Reaction:  Red man's syndrome         Medication List     TAKE these medications    Basaglar KwikPen 100 UNIT/ML Inject 30 Units into the skin 2 (two) times daily.   chlorhexidine 0.12 % solution Commonly known as: PERIDEX Use as directed 15 mLs in the mouth or throat 2 (two) times daily.   clindamycin 150 MG capsule Commonly known as: CLEOCIN Take 3 capsules (450 mg total) by mouth every 8 (eight) hours  for 7 days.   Comfort EZ Pen Needles 32G X 4 MM Misc Generic drug: Insulin Pen Needle USE AS DIRECTED WITH INSULIN PENS.   FREESTYLE LITE test strip Generic drug: glucose blood To be used as directed for checking her blood sugar   Rightest GS550 Blood Glucose test strip Generic drug: glucose blood USE AS DIRECTED UP TO 4 TIMES DAILY.   gabapentin 400 MG capsule Commonly known as: NEURONTIN Take 1 capsule (400 mg total) by mouth 3 (three) times daily.   HumaLOG KwikPen 100 UNIT/ML KwikPen Generic drug: insulin lispro INJECT 5-10 UNITS INTO THE SKIN 3 TIMES DAILY BEFORE MEALS PER SLIDING SCALE. IF BLOOD SUGAR IS 120-250=5 UNITS;251-350=8 UNITS; OVER 350=10 UNITS   HYDROcodone-acetaminophen 5-325 MG tablet Commonly known as: NORCO/VICODIN Take 1 tablet by mouth 3 (three) times daily as needed for severe pain.   Rightest GL300 Lancets Misc USE AS DIRECTED UP TO 4 TIMES DAILY.   Rightest GM550 Blood Glucose w/Device Kit USE AS DIRECTED UP TO 4 TIMES DAILY.        If you experience worsening of your admission symptoms, develop shortness of breath, life threatening emergency, suicidal or homicidal thoughts you must seek medical attention immediately by calling 911 or calling your MD immediately  if symptoms less severe.  You Must read complete instructions/literature along with all the possible adverse reactions/side effects for all the Medicines you take and that have been prescribed to you. Take any new Medicines after you have completely understood and accept all the possible adverse reactions/side effects.   Please note  You were cared for by a hospitalist during your hospital stay. If you have any questions about your discharge medications or the care you received while you were in the hospital after you are discharged, you can call the unit and asked to speak with the hospitalist on call if the hospitalist that took care of you is not available. Once you are discharged, your  primary care physician will handle any further medical issues. Please note that NO REFILLS for any discharge medications will be authorized once you are discharged, as it is imperative that you return to your primary care physician (or establish a relationship with a primary care physician if you do not have one) for your aftercare needs so that they can reassess your need for medications and monitor your lab values. Today   SUBJECTIVE   feeling hungry. Sugar stable. No vomiting. No fever.  VITAL SIGNS:  Blood pressure 118/76, pulse (!) 55, temperature 98.5 F (36.9 C), temperature source Oral, resp. rate 10, height '5\' 4"'  (1.626 m), weight 59 kg, SpO2 100 %.  I/O:   Intake/Output Summary (Last 24 hours) at 05/11/2021 1228 Last data filed at 05/11/2021 1106 Gross per 24 hour  Intake 3096.38 ml  Output 1400 ml  Net 1696.38 ml    PHYSICAL EXAMINATION:  GENERAL:  30 y.o.-year-old patient lying in the bed with  no acute distress.  HEENT: Head atraumatic, normocephalic. Oropharynx Caries diffuse throughout the superior and inferior teeth.  There are some edema over the lingual aspect of the superior lateral gingiva but no significant clear defined margin.  No significant edema on the buccal aspect.  LUNGS: Normal breath sounds bilaterally, no wheezing, rales,rhonchi or crepitation. No use of accessory muscles of respiration.  CARDIOVASCULAR: S1, S2 normal. No murmurs, rubs, or gallops.  ABDOMEN: Soft, non-tender, non-distended. Bowel sounds present. No organomegaly or mass.  EXTREMITIES: No pedal edema, cyanosis, or clubbing.  NEUROLOGIC: non focal PSYCHIATRIC: The patient is alert and oriented x 3.  SKIN: No obvious rash, lesion, or ulcer.   DATA REVIEW:   CBC  Recent Labs  Lab 05/11/21 0636  WBC 9.3  HGB 12.7*  HCT 36.4*  PLT 254    Chemistries  Recent Labs  Lab 05/10/21 2221 05/11/21 0636  NA 121* 137  K 6.0* 4.0  CL 83* 98  CO2 24 32  GLUCOSE 1,192* 194*  BUN 26*  27*  CREATININE 1.45* 0.97  CALCIUM 9.5 9.3  AST 36  --   ALT 57*  --   ALKPHOS 146*  --   BILITOT 0.8  --     Microbiology Results   Recent Results (from the past 240 hour(s))  Resp Panel by RT-PCR (Flu A&B, Covid) Nasopharyngeal Swab     Status: None   Collection Time: 05/11/21 12:18 AM   Specimen: Nasopharyngeal Swab; Nasopharyngeal(NP) swabs in vial transport medium  Result Value Ref Range Status   SARS Coronavirus 2 by RT PCR NEGATIVE NEGATIVE Final    Comment: (NOTE) SARS-CoV-2 target nucleic acids are NOT DETECTED.  The SARS-CoV-2 RNA is generally detectable in upper respiratory specimens during the acute phase of infection. The lowest concentration of SARS-CoV-2 viral copies this assay can detect is 138 copies/mL. A negative result does not preclude SARS-Cov-2 infection and should not be used as the sole basis for treatment or other patient management decisions. A negative result may occur with  improper specimen collection/handling, submission of specimen other than nasopharyngeal swab, presence of viral mutation(s) within the areas targeted by this assay, and inadequate number of viral copies(<138 copies/mL). A negative result must be combined with clinical observations, patient history, and epidemiological information. The expected result is Negative.  Fact Sheet for Patients:  EntrepreneurPulse.com.au  Fact Sheet for Healthcare Providers:  IncredibleEmployment.be  This test is no t yet approved or cleared by the [place] FDA and  has been authorized for detection and/or diagnosis of SARS-CoV-2 by FDA under an Emergency Use Authorization (EUA). This EUA will remain  in effect (meaning this test can be used) for the duration of the COVID-19 declaration under Section 564(b)(1) of the Act, 21 U.S.C.section 360bbb-3(b)(1), unless the authorization is terminated  or revoked sooner.       Influenza A by PCR NEGATIVE  NEGATIVE Final   Influenza B by PCR NEGATIVE NEGATIVE Final    Comment: (NOTE) The Xpert Xpress SARS-CoV-2/FLU/RSV plus assay is intended as an aid in the diagnosis of influenza from Nasopharyngeal swab specimens and should not be used as a sole basis for treatment. Nasal washings and aspirates are unacceptable for Xpert Xpress SARS-CoV-2/FLU/RSV testing.  Fact Sheet for Patients: EntrepreneurPulse.com.au  Fact Sheet for Healthcare Providers: IncredibleEmployment.be  This test is not yet approved or cleared by the [place] FDA and has been authorized for detection and/or diagnosis of SARS-CoV-2 by FDA under an Emergency Use Authorization (EUA). This EUA  will remain in effect (meaning this test can be used) for the duration of the COVID-19 declaration under Section 564(b)(1) of the Act, 21 U.S.C. section 360bbb-3(b)(1), unless the authorization is terminated or revoked.  Performed at [facility], [address] [place], [place] [address]     RADIOLOGY:  No results found.   CODE STATUS:     Code Status Orders  (From admission, onward)           Start     Ordered   05/11/21 0102  Full code  Continuous        05/11/21 0101           Code Status History     Date Active Date Inactive Code Status Order ID Comments User Context   04/19/2021 1159 04/21/2021 2343 Full Code [id number]  Collier Bullock, MD ED   01/05/2021 0733 01/08/2021 2005 Full Code [id number]  Ivor Costa, MD ED   03/03/2020 1720 03/08/2020 2007 Full Code [id number]  [place], [place], DO ED   09/12/2019 1728 09/14/2019 1927 Full Code [id number]  Loletha Grayer, MD ED   10/11/2018 2247 10/15/2018 1843 Full Code [id number]  Lance Coon, MD Inpatient   08/17/2018 1609 08/23/2018 0100 Full Code [id number]  Vaughan Basta, MD Inpatient   06/29/2017 1354 06/30/2017 1850 Full Code [id number]  Idelle Crouch, MD Inpatient   06/05/2017 0456 06/07/2017 2052 Full Code  [id number]  Harrie Foreman, MD Inpatient   06/04/2017 0259 06/05/2017 0456 Full Code [id number]  Saundra Shelling, MD Inpatient   04/17/2017 1055 04/21/2017 1559 Full Code [id number]  Nicholes Mango, MD Inpatient   02/03/2017 1336 02/04/2017 2128 Full Code [id number]  Baxter Hire, MD ED   10/13/2016 0526 10/15/2016 1812 Full Code [id number]  Harrie Foreman, MD Inpatient   09/08/2016 0352 09/08/2016 1837 Full Code [id number]  Hugelmeyer, Alexis, DO Inpatient   08/12/2016 2220 08/13/2016 1710 Full Code [id number]  Theodoro Grist, MD Inpatient   07/04/2016 1224 07/05/2016 1936 Full Code [id number]  Vaughan Basta, MD Inpatient   07/04/2016 1224 07/04/2016 1224 Full Code [id number]  Vaughan Basta, MD Inpatient   04/09/2016 1654 04/11/2016 1409 Full Code [id number]  Theodoro Grist, MD Inpatient   03/26/2016 0118 03/27/2016 1911 Full Code [id number]  HugelmeyerUbaldo Glassing, DO Inpatient   03/18/2016 0815 03/21/2016 2035 Full Code [id number]  Lytle Butte, MD ED   03/12/2016 1220 03/16/2016 1927 Full Code [id number]  Fritzi Mandes, MD Inpatient   02/18/2016 1659 02/19/2016 1718 Full Code [id number]  Nicholes Mango, MD ED   01/12/2016 0858 01/15/2016 1933 Full Code [id number]  Hillary Bow, MD ED   01/11/2016 0035 01/11/2016 2320 Full Code [id number]  Quintella Baton, MD Inpatient   12/12/2015 2204 12/14/2015 1838 Full Code [id number]  Theodoro Grist, MD Inpatient   10/23/2015 1742 10/25/2015 1904 Full Code [id number]  Epifanio Lesches, MD ED   06/10/2015 0820 06/12/2015 1441 Full Code [id number]  Harrie Foreman, MD Inpatient   06/06/2015 2043 06/07/2015 1620 Full Code [id number]  Fritzi Mandes, MD Inpatient   04/07/2015 1245 04/08/2015 2037 Full Code [id number]  Vaughan Basta, MD Inpatient   12/09/2014 1444 12/13/2014 1616 Full Code [id number]  Aldean Jewett, MD Inpatient        TOTAL TIME TAKING CARE OF THIS PATIENT: 40 minutes.    Fritzi Mandes M.D  [facility]    CC: Primary care physician;  Trevor Reusing, NP

## 2021-05-11 NOTE — ED Notes (Signed)
Pt resting in bed with c/o tooth pain, prn pain med given, warm blanket given per request. Pt appreciative, calm and pleasant, resting in bed, insulin gtt infusing as ordered, endotool in use. Oriented x4, call light in reach, bed locked and low.

## 2021-05-11 NOTE — TOC Initial Note (Signed)
Transition of Care [facility]) - Initial/Assessment Note    Patient Details  Name: Trevor Brown MRN: [id number] Date of Birth: [birth year]  [facility]) CM/SW Contact:    Marina Goodell Phone Number: [phone] 05/11/2021, 12:34 PM  Clinical Narrative:                  Patient presents to [facility] due to FSBS reading "high"; along with generalized abdominal pain. Patient has dx of Type 1 diabetes and has an abscessed tooth. [facility].  CSW contacted [facility] w/ [facility] who verified patient has been referred to dentist twice and patient has not followed-up.  Holly scheduled follow-up, [facility] appointment on 05/16/2021, at 10:15AM.  Jeanice Lim stated patient will need to contact dentist directly and schedule an appointment.  CSW gave patient follow-up appointment information and contact information for dentist. CSW explained to patient why he needed to contact the dentist directly. Patient verbalized understanding. CSW also informed patient he had prescriptions [facility] to [facility], and they would be ready for pick-up.  Patient verbalized understanding and stated he would pick up medications.  CSW updated Attending and EDRN.  Expected Discharge Plan: Home/Self Care Barriers to Discharge: No Barriers Identified   Patient Goals and CMS Choice        Expected Discharge Plan and Services Expected Discharge Plan: Home/Self Care In-house Referral: Clinical Social Work     Living arrangements for the past 2 months: Single Family Home Expected Discharge Date: 05/11/21                                    Prior Living Arrangements/Services Living arrangements for the past 2 months: Single Family Home Lives with:: Self Patient language and need for interpreter reviewed:: Yes Do you feel safe going back to the place where you live?: Yes      Need for Family Participation in Patient  Care: No (Comment) Care giver support system in place?: No (comment)   Criminal Activity/Legal Involvement Pertinent to Current Situation/Hospitalization: No - Comment as needed  Activities of Daily Living      Permission Sought/Granted Permission sought to share information with : Family Supports    Share Information with NAME: Pettibone,christopher (Brother)   [phone] (Mobile)           Emotional Assessment Appearance:: Appears younger than stated age Attitude/Demeanor/Rapport: Guarded Affect (typically observed): Withdrawn Orientation: : Oriented to Self, Oriented to Place, Oriented to  Time, Oriented to Situation Alcohol / Substance Use: Not Applicable Psych Involvement: No (comment)  Admission diagnosis:  Hyperglycemia due to type 1 diabetes mellitus (HCC) [E10.65] Patient Active Problem List   Diagnosis Date Noted   Hyperglycemia due to type 1 diabetes mellitus (HCC) 05/11/2021   Non compliance w medication regimen 04/19/2021   HTN (hypertension) 01/05/2021   GERD (gastroesophageal reflux disease) 01/05/2021   SIRS (systemic inflammatory response syndrome) (HCC) 03/03/2020   Cellulitis of left hand 03/03/2020   Elevated blood pressure reading 01/18/2020   Hypokalemia    Sepsis without acute organ dysfunction (HCC)    Dental abscess 09/12/2019   Intractable vomiting    Hematemesis with nausea    Cannabis hyperemesis syndrome concurrent with and due to cannabis abuse (HCC)    Elevated LFTs    Abnormal laboratory test result 07/20/2019   Smoking 07/20/2019   History  of hepatitis C 02/25/2019   Health care maintenance 02/25/2019   Peripheral neuropathy 02/25/2019   Hand abscess 10/13/2018   IVDU (intravenous drug user) 10/11/2018   Hyperkalemia 10/11/2018   Cellulitis and abscess of hand 08/17/2018   Polysubstance abuse (HCC) 01/20/2018   Abdominal pain 06/29/2017   Hyponatremia 06/29/2017   DKA (diabetic ketoacidosis) (HCC) 06/29/2017   Dental erosion  extending into pulp 12/31/2016   MRSA carrier 07/05/2016   Gastroenteritis 04/11/2016   Elevated transaminase level 03/27/2016   Diabetes mellitus type 1 (HCC) 03/27/2016   Tobacco abuse counseling 03/27/2016   Transaminitis 03/25/2016   Leukocytosis 01/13/2016   Cannabinoid hyperemesis syndrome 01/10/2016   Tobacco abuse 01/10/2016   Type 1 diabetes mellitus with hyperglycemia (HCC) 12/12/2015   Chest pain 12/12/2015   Cocaine abuse (HCC) 12/12/2015   DKA (diabetic ketoacidoses) 06/10/2015   Hidradenitis suppurativa of left axilla    DKA, type 1 (HCC) 06/06/2015   Malnutrition of moderate degree (HCC) 04/08/2015   Hepatitis C 12/13/2014   Diabetes type 1, uncontrolled 12/11/2014   Major depressive disorder, single episode, mild (HCC)    Major depression, single episode 12/10/2014   Diabetes mellitus type 1, uncontrolled 12/09/2014   Homelessness 12/09/2014   PCP:  Rolm Gala, NP Pharmacy:   [facility] [address] [place] [address] Phone: [phone] Fax: [phone]  [facility] [address], [address] [place] [address] Phone: [phone] Fax: [phone]  [facility] [address]), [place] - [address] [place] [address] Phone: [phone] Fax: [phone]  [facility] [address], [place] - [address] [place] [address] Phone: [phone] Fax: [phone]     Social Determinants of Health (SDOH) Interventions    Readmission Risk Interventions Readmission Risk Prevention Plan 03/08/2020 03/08/2020 10/15/2018  Transportation Screening - Complete Complete  PCP or Specialist Appt within 3-5 Days Complete - Complete  Palliative Care Screening - Not Applicable -  Medication Review (RN Care Manager) - Complete Complete  Some recent data might be hidden

## 2021-05-11 NOTE — ED Notes (Signed)
Dr Allena Katz ordered this RN to turn off insulin and iv fluids, administer sq insulin and let him eat.

## 2021-05-16 ENCOUNTER — Ambulatory Visit: Payer: Medicaid Other | Admitting: Gerontology

## 2021-05-30 ENCOUNTER — Other Ambulatory Visit: Payer: Self-pay

## 2021-06-01 ENCOUNTER — Other Ambulatory Visit: Payer: Self-pay

## 2021-06-01 ENCOUNTER — Other Ambulatory Visit: Payer: Self-pay | Admitting: Gerontology

## 2021-06-01 DIAGNOSIS — E1065 Type 1 diabetes mellitus with hyperglycemia: Secondary | ICD-10-CM

## 2021-06-01 DIAGNOSIS — G629 Polyneuropathy, unspecified: Secondary | ICD-10-CM

## 2021-06-01 MED ORDER — CHLORHEXIDINE GLUCONATE 0.12 % MT SOLN
OROMUCOSAL | 0 refills | Status: DC
  Filled 2021-06-01: qty 473, 16d supply, fill #0

## 2021-06-01 MED ORDER — BASAGLAR KWIKPEN 100 UNIT/ML ~~LOC~~ SOPN
PEN_INJECTOR | SUBCUTANEOUS | 10 refills | Status: DC
  Filled 2021-06-01: qty 15, 25d supply, fill #0
  Filled 2021-07-11: qty 15, 25d supply, fill #1
  Filled 2021-08-15: qty 15, 25d supply, fill #2
  Filled 2021-09-11: qty 15, 25d supply, fill #3

## 2021-06-01 MED ORDER — CLINDAMYCIN HCL 150 MG PO CAPS
ORAL_CAPSULE | ORAL | 0 refills | Status: DC
  Filled 2021-06-01: qty 63, 7d supply, fill #0

## 2021-06-01 MED FILL — Insulin Lispro Soln Pen-injector 100 Unit/ML (1 Unit Dial): SUBCUTANEOUS | 50 days supply | Qty: 15 | Fill #1 | Status: AC

## 2021-06-04 ENCOUNTER — Other Ambulatory Visit: Payer: Self-pay

## 2021-06-05 ENCOUNTER — Other Ambulatory Visit: Payer: Self-pay

## 2021-06-05 MED FILL — Gabapentin Cap 400 MG: ORAL | 30 days supply | Qty: 90 | Fill #0 | Status: CN

## 2021-07-05 ENCOUNTER — Other Ambulatory Visit: Payer: Self-pay

## 2021-07-11 ENCOUNTER — Other Ambulatory Visit: Payer: Self-pay

## 2021-08-15 ENCOUNTER — Other Ambulatory Visit: Payer: Self-pay

## 2021-08-15 MED FILL — Insulin Lispro Soln Pen-injector 100 Unit/ML (1 Unit Dial): SUBCUTANEOUS | 50 days supply | Qty: 15 | Fill #2 | Status: AC

## 2021-09-05 ENCOUNTER — Other Ambulatory Visit: Payer: Self-pay

## 2021-09-11 ENCOUNTER — Ambulatory Visit: Payer: Medicaid Other | Admitting: Pharmacy Technician

## 2021-09-11 ENCOUNTER — Other Ambulatory Visit: Payer: Self-pay

## 2021-09-11 DIAGNOSIS — Z79899 Other long term (current) drug therapy: Secondary | ICD-10-CM

## 2021-09-11 NOTE — Progress Notes (Signed)
Assisted patient with the completion of re-certification packet for [facility].  Patient to obtain signature of Toney Sang, his cousin, who is his current support person.  Patient understands that signed letter of support must be returned within 30 days for continued medication assistance from [facility].  Sherilyn Dacosta Care Manager [facility]

## 2021-11-22 ENCOUNTER — Other Ambulatory Visit: Payer: Self-pay

## 2021-11-27 ENCOUNTER — Inpatient Hospital Stay: Payer: Self-pay

## 2021-11-27 ENCOUNTER — Encounter: Payer: Self-pay | Admitting: Emergency Medicine

## 2021-11-27 ENCOUNTER — Inpatient Hospital Stay
Admission: EM | Admit: 2021-11-27 | Discharge: 2021-11-30 | DRG: 638 | Disposition: A | Discharge status: Home / Self Care | Payer: Self-pay | Attending: Student | Admitting: Student

## 2021-11-27 ENCOUNTER — Other Ambulatory Visit: Payer: Self-pay

## 2021-11-27 DIAGNOSIS — G629 Polyneuropathy, unspecified: Secondary | ICD-10-CM

## 2021-11-27 DIAGNOSIS — Z794 Long term (current) use of insulin: Secondary | ICD-10-CM

## 2021-11-27 DIAGNOSIS — Z833 Family history of diabetes mellitus: Secondary | ICD-10-CM

## 2021-11-27 DIAGNOSIS — E1040 Type 1 diabetes mellitus with diabetic neuropathy, unspecified: Secondary | ICD-10-CM | POA: Diagnosis present

## 2021-11-27 DIAGNOSIS — Z888 Allergy status to other drugs, medicaments and biological substances status: Secondary | ICD-10-CM

## 2021-11-27 DIAGNOSIS — E611 Iron deficiency: Secondary | ICD-10-CM | POA: Diagnosis present

## 2021-11-27 DIAGNOSIS — F17210 Nicotine dependence, cigarettes, uncomplicated: Secondary | ICD-10-CM | POA: Diagnosis present

## 2021-11-27 DIAGNOSIS — E538 Deficiency of other specified B group vitamins: Secondary | ICD-10-CM | POA: Diagnosis present

## 2021-11-27 DIAGNOSIS — Z79899 Other long term (current) drug therapy: Secondary | ICD-10-CM

## 2021-11-27 DIAGNOSIS — Z8249 Family history of ischemic heart disease and other diseases of the circulatory system: Secondary | ICD-10-CM

## 2021-11-27 DIAGNOSIS — N179 Acute kidney failure, unspecified: Secondary | ICD-10-CM | POA: Diagnosis present

## 2021-11-27 DIAGNOSIS — Z720 Tobacco use: Secondary | ICD-10-CM

## 2021-11-27 DIAGNOSIS — E440 Moderate protein-calorie malnutrition: Secondary | ICD-10-CM | POA: Diagnosis present

## 2021-11-27 DIAGNOSIS — E1110 Type 2 diabetes mellitus with ketoacidosis without coma: Secondary | ICD-10-CM | POA: Diagnosis present

## 2021-11-27 DIAGNOSIS — Z886 Allergy status to analgesic agent status: Secondary | ICD-10-CM

## 2021-11-27 DIAGNOSIS — Z91138 Patient's unintentional underdosing of medication regimen for other reason: Secondary | ICD-10-CM

## 2021-11-27 DIAGNOSIS — E8729 Other acidosis: Secondary | ICD-10-CM

## 2021-11-27 DIAGNOSIS — T383X6A Underdosing of insulin and oral hypoglycemic [antidiabetic] drugs, initial encounter: Secondary | ICD-10-CM | POA: Diagnosis present

## 2021-11-27 DIAGNOSIS — G588 Other specified mononeuropathies: Secondary | ICD-10-CM

## 2021-11-27 DIAGNOSIS — E876 Hypokalemia: Secondary | ICD-10-CM | POA: Diagnosis not present

## 2021-11-27 DIAGNOSIS — E1065 Type 1 diabetes mellitus with hyperglycemia: Secondary | ICD-10-CM | POA: Diagnosis present

## 2021-11-27 DIAGNOSIS — I252 Old myocardial infarction: Secondary | ICD-10-CM

## 2021-11-27 DIAGNOSIS — E860 Dehydration: Secondary | ICD-10-CM | POA: Diagnosis present

## 2021-11-27 DIAGNOSIS — E1010 Type 1 diabetes mellitus with ketoacidosis without coma: Principal | ICD-10-CM | POA: Diagnosis present

## 2021-11-27 DIAGNOSIS — I10 Essential (primary) hypertension: Secondary | ICD-10-CM | POA: Diagnosis present

## 2021-11-27 DIAGNOSIS — T426X6A Underdosing of other antiepileptic and sedative-hypnotic drugs, initial encounter: Secondary | ICD-10-CM | POA: Diagnosis present

## 2021-11-27 DIAGNOSIS — E109 Type 1 diabetes mellitus without complications: Secondary | ICD-10-CM | POA: Diagnosis present

## 2021-11-27 DIAGNOSIS — R52 Pain, unspecified: Secondary | ICD-10-CM

## 2021-11-27 DIAGNOSIS — K219 Gastro-esophageal reflux disease without esophagitis: Secondary | ICD-10-CM | POA: Diagnosis present

## 2021-11-27 DIAGNOSIS — Z881 Allergy status to other antibiotic agents status: Secondary | ICD-10-CM

## 2021-11-27 LAB — URINALYSIS, ROUTINE W REFLEX MICROSCOPIC
Bacteria, UA: NONE SEEN
Bilirubin Urine: NEGATIVE
Glucose, UA: >=500 mg/dL — AB
Hgb urine dipstick: NEGATIVE
Ketones, ur: 80 mg/dL — AB
Leukocytes,Ua: NEGATIVE
Nitrite: NEGATIVE
Protein, ur: 30 mg/dL — AB
Specific Gravity, Urine: 1.026 (ref 1.005–1.030)
Squamous Epithelial / HPF: NONE SEEN (ref 0–5)
pH: 5 (ref 5.0–8.0)

## 2021-11-27 LAB — CBG MONITORING, ED
Glucose-Capillary: 508 mg/dL (ref 70–99)
Glucose-Capillary: 448 mg/dL — ABNORMAL HIGH (ref 70–99)
Glucose-Capillary: 508 mg/dL (ref 70–99)
Glucose-Capillary: 342 mg/dL — ABNORMAL HIGH (ref 70–99)
Glucose-Capillary: 254 mg/dL — ABNORMAL HIGH (ref 70–99)
Glucose-Capillary: 223 mg/dL — ABNORMAL HIGH (ref 70–99)

## 2021-11-27 LAB — BETA-HYDROXYBUTYRIC ACID
Beta-Hydroxybutyric Acid: 6.87 mmol/L — ABNORMAL HIGH (ref 0.05–0.27)
Beta-Hydroxybutyric Acid: 6.39 mmol/L — ABNORMAL HIGH (ref 0.05–0.27)

## 2021-11-27 LAB — BASIC METABOLIC PANEL
Anion gap: 26 — ABNORMAL HIGH (ref 5–15)
Anion gap: 17 — ABNORMAL HIGH (ref 5–15)
BUN: 29 mg/dL — ABNORMAL HIGH (ref 6–20)
BUN: 30 mg/dL — ABNORMAL HIGH (ref 6–20)
CO2: 16 mmol/L — ABNORMAL LOW (ref 22–32)
CO2: 20 mmol/L — ABNORMAL LOW (ref 22–32)
Calcium: 10.1 mg/dL (ref 8.9–10.3)
Calcium: 9.2 mg/dL (ref 8.9–10.3)
Chloride: 98 mmol/L (ref 98–111)
Chloride: 107 mmol/L (ref 98–111)
Creatinine, Ser: 1.48 mg/dL — ABNORMAL HIGH (ref 0.61–1.24)
Creatinine, Ser: 1.43 mg/dL — ABNORMAL HIGH (ref 0.61–1.24)
GFR, Estimated: >60 mL/min (ref >60)
GFR, Estimated: >60 mL/min (ref >60)
Glucose, Bld: 493 mg/dL — ABNORMAL HIGH (ref 70–99)
Glucose, Bld: 360 mg/dL — ABNORMAL HIGH (ref 70–99)
Potassium: 5.1 mmol/L (ref 3.5–5.1)
Potassium: 3.8 mmol/L (ref 3.5–5.1)
Sodium: 140 mmol/L (ref 135–145)
Sodium: 144 mmol/L (ref 135–145)

## 2021-11-27 LAB — CBC
HCT: 43.9 % (ref 39.0–52.0)
Hemoglobin: 13.9 g/dL (ref 13.0–17.0)
MCH: 25.6 pg — ABNORMAL LOW (ref 26.0–34.0)
MCHC: 31.7 g/dL (ref 30.0–36.0)
MCV: 80.8 fL (ref 80.0–100.0)
Platelets: 282 10*3/uL (ref 150–400)
RBC: 5.43 MIL/uL (ref 4.22–5.81)
RDW: 15.3 % (ref 11.5–15.5)
WBC: 10.3 10*3/uL (ref 4.0–10.5)
nRBC: 0 % (ref 0.0–0.2)

## 2021-11-27 LAB — BLOOD GAS, VENOUS
Acid-base deficit: 7.9 mmol/L — ABNORMAL HIGH (ref 0.0–2.0)
Bicarbonate: 18.3 mmol/L — ABNORMAL LOW (ref 20.0–28.0)
O2 Saturation: 58.7 %
Patient temperature: 37
pCO2, Ven: 39 mmHg — ABNORMAL LOW (ref 44–60)
pH, Ven: 7.28 (ref 7.25–7.43)
pO2, Ven: 40 mmHg (ref 32–45)

## 2021-11-27 LAB — TROPONIN I (HIGH SENSITIVITY): Troponin I (High Sensitivity): 6 ng/L (ref <18)

## 2021-11-27 MED ORDER — LACTATED RINGERS IV SOLN: INTRAVENOUS | Status: DC

## 2021-11-27 MED ORDER — ONDANSETRON HCL 4 MG/2ML IJ SOLN: 4.0000 mg | Freq: Four times a day (QID) | INTRAMUSCULAR | Status: DC | PRN

## 2021-11-27 MED ORDER — GABAPENTIN 300 MG PO CAPS
400.0000 mg | ORAL_CAPSULE | Freq: Three times a day (TID) | ORAL | Status: AC
  Administered 2021-11-27 – 2021-11-28 (×2): 400 mg via ORAL
  Filled 2021-11-27 (×2): qty 1

## 2021-11-27 MED ORDER — DEXTROSE IN LACTATED RINGERS 5 % IV SOLN: INTRAVENOUS | Status: DC

## 2021-11-27 MED ORDER — SODIUM CHLORIDE 0.9 % IV BOLUS
1000.0000 mL | Freq: Once | INTRAVENOUS | Status: AC
  Administered 2021-11-27: 1000 mL via INTRAVENOUS

## 2021-11-27 MED ORDER — DEXTROSE 50 % IV SOLN: 0.0000 mL | INTRAVENOUS | Status: DC | PRN

## 2021-11-27 MED ORDER — KETOROLAC TROMETHAMINE 15 MG/ML IJ SOLN
15.0000 mg | Freq: Four times a day (QID) | INTRAMUSCULAR | Status: AC | PRN
Start: 2021-11-27 — End: 2021-11-28
  Administered 2021-11-27 – 2021-11-28 (×3): 15 mg via INTRAVENOUS
  Filled 2021-11-27 (×3): qty 1

## 2021-11-27 MED ORDER — SODIUM CHLORIDE 0.9 % IV SOLN
6.2500 mg | Freq: Four times a day (QID) | INTRAVENOUS | Status: DC | PRN
  Filled 2021-11-27: qty 0.25

## 2021-11-27 MED ORDER — LACTATED RINGERS IV BOLUS
20.0000 mL/kg | Freq: Once | INTRAVENOUS | Status: AC
  Administered 2021-11-27: 1088 mL via INTRAVENOUS

## 2021-11-27 MED ORDER — ACETAMINOPHEN 650 MG RE SUPP: 650.0000 mg | Freq: Four times a day (QID) | RECTAL | Status: DC | PRN

## 2021-11-27 MED ORDER — HYDRALAZINE HCL 20 MG/ML IJ SOLN: 5.0000 mg | Freq: Four times a day (QID) | INTRAMUSCULAR | Status: DC | PRN

## 2021-11-27 MED ORDER — NICOTINE 21 MG/24HR TD PT24
21.0000 mg | MEDICATED_PATCH | Freq: Every day | TRANSDERMAL | Status: DC | PRN
Start: 2021-11-27 — End: 2021-11-30
  Administered 2021-11-27 – 2021-11-29 (×2): 21 mg via TRANSDERMAL
  Filled 2021-11-27 (×2): qty 1

## 2021-11-27 MED ORDER — ENOXAPARIN SODIUM 40 MG/0.4ML IJ SOSY
40.0000 mg | PREFILLED_SYRINGE | INTRAMUSCULAR | Status: DC
  Filled 2021-11-27: qty 0.4

## 2021-11-27 MED ORDER — ACETAMINOPHEN 325 MG PO TABS
650.0000 mg | ORAL_TABLET | Freq: Four times a day (QID) | ORAL | Status: DC | PRN
  Administered 2021-11-28 – 2021-11-29 (×2): 650 mg via ORAL
  Filled 2021-11-27 (×2): qty 2

## 2021-11-27 MED ORDER — INSULIN REGULAR(HUMAN) IN NACL 100-0.9 UT/100ML-% IV SOLN
INTRAVENOUS | Status: DC
  Administered 2021-11-27: 6.5 [IU]/h via INTRAVENOUS
  Filled 2021-11-27: qty 100

## 2021-11-27 MED ORDER — ONDANSETRON HCL 4 MG/2ML IJ SOLN
4.0000 mg | Freq: Once | INTRAMUSCULAR | Status: AC
  Administered 2021-11-27: 4 mg via INTRAVENOUS
  Filled 2021-11-27: qty 2

## 2021-11-27 MED ORDER — ONDANSETRON HCL 4 MG/2ML IJ SOLN: 4.0000 mg | Freq: Once | INTRAMUSCULAR | Status: DC

## 2021-11-27 NOTE — ED Triage Notes (Signed)
Pt blood sugar 500 in triage, pt reports reading "high" at home. Pt endorses nausea vomiting. Pt reports been without insulin for 2 days. Pt has hx of dka- reports this feels similar. Type 1 diabetic ?

## 2021-11-27 NOTE — H&P (Addendum)
?History and Physical  ? ?Trevor Brown:[id number] DOB: April 20, 1991 DOA: 11/27/2021 ? ?PCP: Dr. Caryl Asp ?Patient coming from: home ? ?I have personally briefly reviewed patient's old medical records in [place]. ? ?Chief Concern: high blood glucose ? ?HPI: [name] is a 31 year old male with history of insulin dependent diabetes mellitus type 1, neuropathy, history of heroin abuse, hypertension, history of noncompliance with medical treatment, who presents to the ED for chief concern of elevated blood glucose of 'high' at home.  ? ?Initial vitals in the ED showed temp of 98.4, rr 18, hr 107, blood pressure 151/98, spO2 was 100% on RA.  ? ?Serum sodium is 140, K 5.1, chloride 98, bicarb 16, bun 19, serum creatinin 1.48, nonfasting blood glucose is 493, wbc 10.3, hemoglobin 13.9, platelets 282.  ? ?Anion gap elevated at 26. Beta-hydroxybutyrate is in process.  ? ?Per report, patient ran out of his insulin over several days.  ? ?ED treatment: started on insulin gtt and LR bolus of 1088 ml, NS 1 L bolus. Ondansetron 4 mg IV given.  ? ?At bedside, patient is able to tell me his name, age, current location. He reports running out of insulin prescription and insulin for at least two days. He endorses nausea, vomiting, too many times to count over the last two days. He endorses abdominal pain and pain of his bilateral lower extremities.  ? ?He denies trauma to his person. He denies chest pain, shortness of breath, dysuria, diarrhea, dysphagia, constipation. He reports he had a BM on day of admission and states it was normal brown.  ? ?Social history: He endorses daily tobacco use, 2 ppd. He states he is not ready to quit. He denies etoh and recreational drug use. He works taking down trees. ? ?ROS: ?Constitutional: no weight change, no fever ?ENT/Mouth: no sore throat, no rhinorrhea ?Eyes: no eye pain, no vision changes ?Cardiovascular: no chest pain, no dyspnea,  no edema, no  palpitations ?Respiratory: no cough, no sputum, no wheezing ?Gastrointestinal: no nausea, no vomiting, no diarrhea, no constipation, abdominal pain ?Genitourinary: no urinary incontinence, no dysuria, no hematuria ?Musculoskeletal: no arthralgias, no myalgias, + bilateral lower extremity leg pain ?Skin: no skin lesions, no pruritus, ?Neuro: + weakness, no loss of consciousness, no syncope ?Psych: no anxiety, no depression, + decrease appetite ?Heme/Lymph: no bruising, no bleeding ? ?ED Course: Discussed with emergency medicine provider, patient requiring hospitalization for chief concern of DKA.  ? ?Assessment/Plan ? ?Principal Problem: ?  DKA (diabetic ketoacidosis) ([place]) ?Active Problems: ?  Malnutrition of moderate degree ([place]) ?  Type 1 diabetes mellitus with hyperglycemia (HCC) ?  Tobacco abuse ?  Diabetes mellitus type 1 ([place]) ?  Peripheral neuropathy ?  HTN (hypertension) ?  GERD (gastroesophageal reflux disease) ?  High anion gap metabolic acidosis ?  Generalized pain ?  ?Assessment and Plan: ? ?* DKA (diabetic ketoacidosis) ([place]) ?- Presumed secondary to patient 'running out of insulin' ?- Port chest x-rays, hs troponin, and ekg ordered and discussed with nursing staff at bedside ?- Continue insulin gtt and IV fluid per EDP ?- Holding home insulin glargine 30 u BID and insulin sliding scale TID AC ?- Consult placed to diabetes coordinator ?- NPO until gap is closed ?- Check A1c in the AM ?- BMP q4h, 5 occurences ordered ?- Supportive measures including aggressive IV fluid, ondansetron 4 mg IV q6h prn for nausea, vomiting; phenergan 6.25 mg IV q6h prn for refractory nausea/vomiting, 2 doses ordered ?-  Admit to stepdown-inpatient ? ?Generalized pain ?- I can not find trauma to his person ?- Likely secondary to DKA ?- Ketorolac 15 mg IV q6h prn for moderate pain, 1 day ordered ? ?High anion gap metabolic acidosis ?- Presumed secondary to DKA, treat as above ? ?HTN (hypertension) ?- Hydralazine 5 mg IV q6h prn  for sbp > 165, 3 days ordered ? ?Peripheral neuropathy ?- Patient states he has not been taking his gabapentin due to running out of medications ?- Prior prescribed gabapentin 400 mg TID, two doses ordered ? ?Tobacco abuse ?- Nicotine patch daily prn for nicotine craving ordered ? ?Malnutrition of moderate degree (HCC) ?- Dietician has been consulted ? ?Chart reviewed.  ? ?05/10/21-05/11/21 hospitalized for hyperosmolar nonketotic hyperglycemia. He received IV insulin gtt with aggressive fluid resuscitation, HHS protocal was followed. He was transitioned to subcutaneous home insulin dose. He was counseled on compliance. He was found to have dental/gumline infection, and received IV clindamycin. He was prescribed clindamycin for seven days.  ? ?DVT prophylaxis: enoxaparin 40 mg subcu daily ?Code Status: full code ?Diet: NPO, pending closure of anion gap ?Family Communication: no ?Disposition Plan: pending clinical course ?Consults called: pending clinical course ?Admission status: inpatient, stepdown ? ?Past Medical History:  ?Diagnosis Date  ? Heart attack ([place])   ? Pt claims he had heart attack a year ago ( 2015)- and was admitted in [facility] for that, but not given any meds or angiogram, on review of chart- I could not find any details like that.  ? Hepatitis C, acute may 2016  ? Heroin abuse ([place])   ? History of noncompliance with medical treatment   ? Hypertension   ? Type 1 diabetes ([place])   ? ?Past Surgical History:  ?Procedure Laterality Date  ? INCISION AND DRAINAGE ABSCESS Left 03/04/2020  ? Procedure: INCISION AND DRAINAGE ABSCESS;  Surgeon: Corky Mull, MD;  Location: [facility] ORS;  Service: Orthopedics;  Laterality: Left;  ? NO PAST SURGERIES    ? none    ? ?Social History:  reports that he has been smoking cigarettes. He has been smoking an average of 2 packs per day. He has never used smokeless tobacco. He reports that he does not currently use drugs after having used the following drugs: IV, Heroin, and  Marijuana. He reports that he does not drink alcohol. ? ?Allergies  ?Allergen Reactions  ? Bee Venom Anaphylaxis  ? Ibuprofen Itching, Rash and Other (See Comments)  ?  Other reaction(s): Other (See Comments) ?Stomach upset ?Reaction:  GI upset   ? Tramadol Hives and Swelling  ? Vancomycin Rash and Other (See Comments)  ?  Reaction:  Red man's syndrome   ? ?Family History  ?Problem Relation Age of Onset  ? Cirrhosis Mother   ? Diabetes Mellitus II Maternal Grandmother   ? Diabetes Mellitus II Maternal Grandfather   ? CAD Father   ? ?Family history: Family history reviewed and not pertinent. ? ?Prior to Admission medications   ?Medication Sig Start Date End Date Taking? Authorizing Provider  ?blood glucose meter kit and supplies KIT USE AS DIRECTED UP TO 4 TIMES DAILY. 04/18/21   Harvest Dark, MD  ?chlorhexidine (PERIDEX) 0.12 % solution Use as directed 15 mLs in the mouth or throat 2 (two) times daily. ?Patient not taking: Reported on 11/27/2021 05/11/21   Fritzi Mandes, MD  ?chlorhexidine (PERIDEX) 0.12 % solution Use as directed: Rinse and spit 31m in the mouth or throat twice daily ?Patient not taking: Reported  on 11/27/2021 05/11/21     ?clindamycin (CLEOCIN) 150 MG capsule Take 3 capsules by mouth once every 8 hours for 7 days. ?Patient not taking: Reported on 11/27/2021 05/11/21   Fritzi Mandes, MD  ?gabapentin (NEURONTIN) 400 MG capsule Take 1 capsule (400 mg total) by mouth 3 (three) times daily. ?Patient not taking: Reported on 11/27/2021 06/05/21   Iloabachie, [name], NP  ?glucose blood (FREESTYLE LITE) test strip To be used as directed for checking her blood sugar 01/08/21   Ralene Muskrat B, MD  ?glucose blood (RIGHTEST GS550 BLOOD GLUCOSE) test strip USE AS DIRECTED UP TO 4 TIMES DAILY. 04/19/21   Cammy Copa, RPH  ?HYDROcodone-acetaminophen (NORCO/VICODIN) 5-325 MG tablet Take 1 tablet by mouth 3 (three) times daily as needed for severe pain. ?Patient not taking: Reported on 11/27/2021 05/11/21    Fritzi Mandes, MD  ?Insulin Glargine [facility]) 100 UNIT/ML Inject 30 Units into the skin 2 (two) times daily. 05/11/21   Fritzi Mandes, MD  ?Insulin Glargine (BASAGLAR KWIKPEN) 100 UNIT/ML INJECT 30 UNITS SUBCUTANEOUSLY INTO THE

## 2021-11-27 NOTE — Assessment & Plan Note (Signed)
-   I can not find trauma to his person ?- Likely secondary to DKA ?- Ketorolac 15 mg IV q6h prn for moderate pain, 1 day ordered ?

## 2021-11-27 NOTE — ED Notes (Signed)
Dr. Cox at the bedside 

## 2021-11-27 NOTE — Hospital Course (Addendum)
[name] is a 31 year old male with history of insulin dependent diabetes mellitus type 1, neuropathy, history of heroin abuse, hypertension, history of noncompliance with medical treatment, who presents to the ED for chief concern of elevated blood glucose of 'high' at home.  ? ?Initial vitals in the ED showed temp of 98.4, rr 18, hr 107, blood pressure 151/98, spO2 was 100% on RA.  ? ?Serum sodium is 140, K 5.1, chloride 98, bicarb 16, bun 19, serum creatinin 1.48, nonfasting blood glucose is 493, wbc 10.3, hemoglobin 13.9, platelets 282.  ? ?Anion gap elevated at 26. Beta-hydroxybutyrate is in process.  ? ?Per report, patient ran out of his insulin over several days.  ? ?ED treatment: started on insulin gtt and LR bolus of 1088 ml, NS 1 L bolus. Ondansetron 4 mg IV given.  ?

## 2021-11-27 NOTE — Assessment & Plan Note (Addendum)
-   Presumed secondary to DKA, treat as above ?

## 2021-11-27 NOTE — ED Provider Notes (Signed)
? ?[facility] ?Provider Note ? ? ? Event Date/Time  ? First MD Initiated Contact with Patient 11/27/21 1659   ?  (approximate) ? ? ?History  ? ?Hyperglycemia ? ? ?HPI ? ?Trevor Brown is a 31 y.o. male  who, per discharge summary dated 05/11/21 had admission for hyperglycemia due to type 1 diabetes who presents to the emergency department today because of concern for feeling sick and high blood sugar.  The patient states that he has been out of his insulin for the past few days.  This is when his symptoms started.  He states he has had diabetic ketoacidosis in the past.  He has had frequent episodes of nausea and vomiting.  ? ? ?Physical Exam  ? ?Triage Vital Signs: ?ED Triage Vitals  ?Enc Vitals Group  ?   BP 11/27/21 1556 (!) 151/98  ?   Pulse Rate 11/27/21 1556 (!) 107  ?   Resp 11/27/21 1556 18  ?   Temp 11/27/21 1556 98.4 ?F (36.9 ?C)  ?   Temp src --   ?   SpO2 11/27/21 1556 100 %  ?   Weight 11/27/21 1604 120 lb (54.4 kg)  ?   Height 11/27/21 1604 5\' 4"  (1.626 m)  ?   Head Circumference --   ?   Peak Flow --   ?   Pain Score 11/27/21 1604 10  ? ?Most recent vital signs: ?Vitals:  ? 11/27/21 1556  ?BP: (!) 151/98  ?Pulse: (!) 107  ?Resp: 18  ?Temp: 98.4 ?F (36.9 ?C)  ?SpO2: 100%  ? ?General: Awake, alert and oriented. ?CV:  Good peripheral perfusion. Tachycardia. ?Resp:  Normal effort. Lungs clear. ?Abd:  No distention.  ? ?ED Results / Procedures / Treatments  ? ?Labs ?(all labs ordered are listed, but only abnormal results are displayed) ?Labs Reviewed  ?BASIC METABOLIC PANEL - Abnormal; Notable for the following components:  ?    Result Value  ? CO2 16 (*)   ? Glucose, Bld 493 (*)   ? BUN 29 (*)   ? Creatinine, Ser 1.48 (*)   ? Anion gap 26 (*)   ? All other components within normal limits  ?CBC - Abnormal; Notable for the following components:  ? MCH 25.6 (*)   ? All other components within normal limits  ?BLOOD GAS, VENOUS - Abnormal; Notable for the following components:  ? pCO2,  Ven 39 (*)   ? Bicarbonate 18.3 (*)   ? Acid-base deficit 7.9 (*)   ? All other components within normal limits  ?CBG MONITORING, ED - Abnormal; Notable for the following components:  ? Glucose-Capillary 508 (*)   ? All other components within normal limits  ?CBG MONITORING, ED - Abnormal; Notable for the following components:  ? Glucose-Capillary 508 (*)   ? All other components within normal limits  ?CBG MONITORING, ED - Abnormal; Notable for the following components:  ? Glucose-Capillary 448 (*)   ? All other components within normal limits  ?URINALYSIS, ROUTINE W REFLEX MICROSCOPIC  ?BETA-HYDROXYBUTYRIC ACID  ?BASIC METABOLIC PANEL  ?BASIC METABOLIC PANEL  ?BASIC METABOLIC PANEL  ?BASIC METABOLIC PANEL  ?BETA-HYDROXYBUTYRIC ACID  ?BETA-HYDROXYBUTYRIC ACID  ?HEMOGLOBIN A1C  ?TROPONIN I (HIGH SENSITIVITY)  ? ? ?PROCEDURES: ? ?Critical Care performed: Yes, see critical care procedure note(s) ? ?Procedures ? ?CRITICAL CARE ?Performed by: 01/27/22 ? ? ?Total critical care time: 35 minutes ? ?Critical care time was exclusive of separately billable procedures and treating other  patients. ? ?Critical care was necessary to treat or prevent imminent or life-threatening deterioration. ? ?Critical care was time spent personally by me on the following activities: development of treatment plan with patient and/or surrogate as well as nursing, discussions with consultants, evaluation of patient's response to treatment, examination of patient, obtaining history from patient or surrogate, ordering and performing treatments and interventions, ordering and review of laboratory studies, ordering and review of radiographic studies, pulse oximetry and re-evaluation of patient's condition. ? ? ?MEDICATIONS ORDERED IN ED: ?Medications  ?ondansetron (ZOFRAN) injection 4 mg (has no administration in time range)  ?sodium chloride 0.9 % bolus 1,000 mL (has no administration in time range)  ?lactated ringers bolus 1,088 mL (has no  administration in time range)  ?insulin regular, human (MYXREDLIN) 100 units/ 100 mL infusion (has no administration in time range)  ?lactated ringers infusion (has no administration in time range)  ?dextrose 5 % in lactated ringers infusion (has no administration in time range)  ?dextrose 50 % solution 0-50 mL (has no administration in time range)  ? ? ? ?IMPRESSION / MDM / ASSESSMENT AND PLAN / ED COURSE  ?I reviewed the triage vital signs and the nursing notes. ?             ?               ? ?Differential diagnosis includes, but is not limited to, hyperglycemia, DKA. ? ?Patient presented to the emergency department today because of concerns for high blood sugar in the setting of not having his insulin over the past couple of days.  He is also complained of nausea and vomiting.  Blood work here is concerning for diabetic ketoacidosis with elevated blood sugar, anion gap and pH less than 7.3.  Because of this patient was ordered IV fluids as well as IV insulin.  I discussed with Dr. Sedalia Muta with the hospitalist service who will plan on admission. ? ? ?FINAL CLINICAL IMPRESSION(S) / ED DIAGNOSES  ? ?Final diagnoses:  ?Diabetic ketoacidosis without coma associated with type 1 diabetes mellitus (HCC)  ? ? ? ?Note:  This document was prepared using Dragon voice recognition software and may include unintentional dictation errors. ? ?  ?Phineas Semen, MD ?11/27/21 1939 ? ?

## 2021-11-27 NOTE — Assessment & Plan Note (Signed)
-   Dietician has been consulted ?

## 2021-11-27 NOTE — Assessment & Plan Note (Addendum)
-   Presumed secondary to patient 'running out of insulin' ?- Port chest x-rays, hs troponin, and ekg ordered and discussed with nursing staff at bedside ?- Continue insulin gtt and IV fluid per EDP ?- Holding home insulin glargine 30 u BID and insulin sliding scale TID AC ?- Consult placed to diabetes coordinator ?- NPO until gap is closed ?- Check A1c in the AM ?- BMP q4h, 5 occurences ordered ?- Supportive measures including aggressive IV fluid, ondansetron 4 mg IV q6h prn for nausea, vomiting; phenergan 6.25 mg IV q6h prn for refractory nausea/vomiting, 2 doses ordered ?- Admit to stepdown-inpatient ?

## 2021-11-27 NOTE — ED Notes (Signed)
Report received from Hannah, RN 

## 2021-11-27 NOTE — ED Provider Triage Note (Signed)
Emergency Medicine Provider Triage Evaluation Note ? ?Trevor Brown, a 31 y.o. male  was evaluated in triage.  Pt complains of elevated blood sugar and DKA, presents to the ED for evaluation of high blood sugars.  Patient reports he had a reading at home that was "high."  He has had nausea and vomiting since that time.  Patient was has been out of his insulin for about 2 days. ? ?Review of Systems  ?Positive: Hyperglycemia, NV ?Negative: FCS ? ?Physical Exam  ?BP (!) 151/98   Pulse (!) 107   Temp 98.4 ?F (36.9 ?C)   Resp 18   SpO2 100%  ?Gen:   Awake, no distress  actively vomiting ?Resp:  Normal effort CTA ?MSK:   Moves extremities without difficulty  ?CVS:  Tachy rate ? ?Medical Decision Making  ?Medically screening exam initiated at 4:02 PM.  Appropriate orders placed.  Trevor Brown was informed that the remainder of the evaluation will be completed by another provider, this initial triage assessment does not replace that evaluation, and the importance of remaining in the ED until their evaluation is complete. ? ?Patient with type 1 diabetes and a history of DKA presents to the ED with hyperglycemia, nausea, vomiting.  He reports he has not had insulin in the last 2 days. ?  ?Melvenia Needles, PA-C ?11/27/21 1608 ? ?

## 2021-11-27 NOTE — Assessment & Plan Note (Signed)
-   Hydralazine 5 mg IV q6h prn for sbp > 165, 3 days ordered ?

## 2021-11-27 NOTE — Assessment & Plan Note (Signed)
-   Nicotine patch daily prn for nicotine craving ordered ?

## 2021-11-27 NOTE — Assessment & Plan Note (Signed)
-   Patient states he has not been taking his gabapentin due to running out of medications ?- Prior prescribed gabapentin 400 mg TID, two doses ordered ?

## 2021-11-27 NOTE — ED Notes (Signed)
Attempted iv in right upper arm no success. Md aware  ? ?

## 2021-11-28 LAB — CBG MONITORING, ED
Glucose-Capillary: 206 mg/dL — ABNORMAL HIGH (ref 70–99)
Glucose-Capillary: 206 mg/dL — ABNORMAL HIGH (ref 70–99)
Glucose-Capillary: 176 mg/dL — ABNORMAL HIGH (ref 70–99)
Glucose-Capillary: >600 mg/dL (ref 70–99)
Glucose-Capillary: >600 mg/dL (ref 70–99)
Glucose-Capillary: 161 mg/dL — ABNORMAL HIGH (ref 70–99)
Glucose-Capillary: 157 mg/dL — ABNORMAL HIGH (ref 70–99)
Glucose-Capillary: 114 mg/dL — ABNORMAL HIGH (ref 70–99)
Glucose-Capillary: 191 mg/dL — ABNORMAL HIGH (ref 70–99)
Glucose-Capillary: 157 mg/dL — ABNORMAL HIGH (ref 70–99)

## 2021-11-28 LAB — BASIC METABOLIC PANEL
Anion gap: 11 (ref 5–15)
Anion gap: 5 (ref 5–15)
Anion gap: 5 (ref 5–15)
Anion gap: 6 (ref 5–15)
BUN: 32 mg/dL — ABNORMAL HIGH (ref 6–20)
BUN: 36 mg/dL — ABNORMAL HIGH (ref 6–20)
BUN: 34 mg/dL — ABNORMAL HIGH (ref 6–20)
BUN: 34 mg/dL — ABNORMAL HIGH (ref 6–20)
CO2: 24 mmol/L (ref 22–32)
CO2: 27 mmol/L (ref 22–32)
CO2: 25 mmol/L (ref 22–32)
CO2: 25 mmol/L (ref 22–32)
Calcium: 9.1 mg/dL (ref 8.9–10.3)
Calcium: 8.5 mg/dL — ABNORMAL LOW (ref 8.9–10.3)
Calcium: 8.5 mg/dL — ABNORMAL LOW (ref 8.9–10.3)
Calcium: 8.5 mg/dL — ABNORMAL LOW (ref 8.9–10.3)
Chloride: 110 mmol/L (ref 98–111)
Chloride: 108 mmol/L (ref 98–111)
Chloride: 104 mmol/L (ref 98–111)
Chloride: 104 mmol/L (ref 98–111)
Creatinine, Ser: 1.3 mg/dL — ABNORMAL HIGH (ref 0.61–1.24)
Creatinine, Ser: 1.21 mg/dL (ref 0.61–1.24)
Creatinine, Ser: 1.08 mg/dL (ref 0.61–1.24)
Creatinine, Ser: 1.17 mg/dL (ref 0.61–1.24)
GFR, Estimated: >60 mL/min (ref >60)
GFR, Estimated: >60 mL/min (ref >60)
GFR, Estimated: >60 mL/min (ref >60)
GFR, Estimated: >60 mL/min (ref >60)
Glucose, Bld: 225 mg/dL — ABNORMAL HIGH (ref 70–99)
Glucose, Bld: 189 mg/dL — ABNORMAL HIGH (ref 70–99)
Glucose, Bld: 171 mg/dL — ABNORMAL HIGH (ref 70–99)
Glucose, Bld: 173 mg/dL — ABNORMAL HIGH (ref 70–99)
Potassium: 3.6 mmol/L (ref 3.5–5.1)
Potassium: 3.6 mmol/L (ref 3.5–5.1)
Potassium: 3.3 mmol/L — ABNORMAL LOW (ref 3.5–5.1)
Potassium: 3.3 mmol/L — ABNORMAL LOW (ref 3.5–5.1)
Sodium: 145 mmol/L (ref 135–145)
Sodium: 140 mmol/L (ref 135–145)
Sodium: 134 mmol/L — ABNORMAL LOW (ref 135–145)
Sodium: 135 mmol/L (ref 135–145)

## 2021-11-28 LAB — GLUCOSE, CAPILLARY
Glucose-Capillary: 177 mg/dL — ABNORMAL HIGH (ref 70–99)
Glucose-Capillary: 204 mg/dL — ABNORMAL HIGH (ref 70–99)
Glucose-Capillary: 185 mg/dL — ABNORMAL HIGH (ref 70–99)

## 2021-11-28 LAB — VITAMIN B12: Vitamin B-12: 1018 pg/mL — ABNORMAL HIGH (ref 180–914)

## 2021-11-28 LAB — HEMOGLOBIN A1C
Hgb A1c MFr Bld: 9.2 % — ABNORMAL HIGH (ref 4.8–5.6)
Mean Plasma Glucose: 217.34 mg/dL

## 2021-11-28 LAB — MAGNESIUM: Magnesium: 1.9 mg/dL (ref 1.7–2.4)

## 2021-11-28 LAB — BETA-HYDROXYBUTYRIC ACID
Beta-Hydroxybutyric Acid: 0.23 mmol/L (ref 0.05–0.27)
Beta-Hydroxybutyric Acid: 0.39 mmol/L — ABNORMAL HIGH (ref 0.05–0.27)

## 2021-11-28 LAB — PHOSPHORUS: Phosphorus: 2.2 mg/dL — ABNORMAL LOW (ref 2.5–4.6)

## 2021-11-28 MED ORDER — POTASSIUM CHLORIDE CRYS ER 20 MEQ PO TBCR
20.0000 meq | EXTENDED_RELEASE_TABLET | Freq: Once | ORAL | Status: AC
  Administered 2021-11-28: 20 meq via ORAL

## 2021-11-28 MED ORDER — GLUCERNA SHAKE PO LIQD
237.0000 mL | Freq: Three times a day (TID) | ORAL | Status: DC
  Administered 2021-11-28: 237 mL via ORAL

## 2021-11-28 MED ORDER — INSULIN ASPART 100 UNIT/ML IJ SOLN
0.0000 [IU] | Freq: Three times a day (TID) | INTRAMUSCULAR | Status: DC
  Administered 2021-11-28: 3 [IU] via SUBCUTANEOUS
  Administered 2021-11-28: 5 [IU] via SUBCUTANEOUS
  Administered 2021-11-29: 2 [IU] via SUBCUTANEOUS
  Administered 2021-11-29: 11 [IU] via SUBCUTANEOUS
  Administered 2021-11-30: 8 [IU] via SUBCUTANEOUS
  Administered 2021-11-30: 3 [IU] via SUBCUTANEOUS
  Filled 2021-11-28 (×7): qty 1

## 2021-11-28 MED ORDER — OXYCODONE HCL 5 MG PO TABS
5.0000 mg | ORAL_TABLET | Freq: Two times a day (BID) | ORAL | Status: DC | PRN
  Administered 2021-11-28 – 2021-11-29 (×2): 5 mg via ORAL
  Filled 2021-11-28 (×2): qty 1

## 2021-11-28 MED ORDER — INSULIN GLARGINE-YFGN 100 UNIT/ML ~~LOC~~ SOLN
30.0000 [IU] | Freq: Two times a day (BID) | SUBCUTANEOUS | Status: DC
  Administered 2021-11-28 – 2021-11-30 (×5): 30 [IU] via SUBCUTANEOUS
  Filled 2021-11-28 (×6): qty 0.3

## 2021-11-28 MED ORDER — K PHOS MONO-SOD PHOS DI & MONO 155-852-130 MG PO TABS
500.0000 mg | ORAL_TABLET | Freq: Four times a day (QID) | ORAL | Status: AC
  Administered 2021-11-28 (×2): 500 mg via ORAL
  Filled 2021-11-28 (×2): qty 2

## 2021-11-28 MED ORDER — GABAPENTIN 400 MG PO CAPS
400.0000 mg | ORAL_CAPSULE | Freq: Three times a day (TID) | ORAL | Status: DC
  Administered 2021-11-28 – 2021-11-30 (×6): 400 mg via ORAL
  Filled 2021-11-28 (×7): qty 1

## 2021-11-28 MED ORDER — POTASSIUM CHLORIDE CRYS ER 20 MEQ PO TBCR: 40.0000 meq | EXTENDED_RELEASE_TABLET | Freq: Once | ORAL | Status: DC

## 2021-11-28 NOTE — ED Notes (Signed)
Informed RN bed assigned 

## 2021-11-28 NOTE — ED Notes (Signed)
Lab called stating that the BMP that was sent revealed that the patient's blood glucose was >700. Informed lab tech that this value was not accurate and that I would do a redraw. New order placed by lab. ?

## 2021-11-28 NOTE — Progress Notes (Signed)
[facility] Progress Note ? ?Patient: Trevor Brown    EQA:[id number]  DOA: 11/27/2021    ? ?Date of Service: the patient was seen and examined on 11/28/2021 ? ?Chief Complaint  ?Patient presents with  ? Hyperglycemia  ? ?Brief hospital course: ?[name] is a 31 year old male with history of insulin dependent diabetes mellitus type 1, neuropathy, history of heroin abuse, hypertension, history of noncompliance with medical treatment, who presents to the ED for chief concern of elevated blood glucose of 'high' at home.  ?  ?Initial vitals in the ED showed temp of 98.4, rr 18, hr 107, blood pressure 151/98, spO2 was 100% on RA.  ?  ?Serum sodium is 140, K 5.1, chloride 98, bicarb 16, bun 19, serum creatinin 1.48, nonfasting blood glucose is 493, wbc 10.3, hemoglobin 13.9, platelets 282.  ?  ?Anion gap elevated at 26. Beta-hydroxybutyrate is in process.  ?  ?Per report, patient ran out of his insulin over several days.  ?  ?ED treatment: started on insulin gtt and LR bolus of 1088 ml, NS 1 L bolus. Ondansetron 4 mg IV given.  ? ? ? ?Assessment and Plan: ?* DKA (diabetic ketoacidosis) (HCC) ?- Presumed secondary to patient 'running out of insulin' ?- Port chest x-rays, hs troponin, and ekg ordered and discussed with nursing staff at bedside ?- s/p insulin gtt and IV fluid ?- Resumed insulin glargine 30 u BID and insulin sliding scale TID AC ?- Consult placed to diabetes coordinato ?- Supportive measures including aggressive IV fluid, ondansetron 4 mg IV q6h prn for nausea, vomiting; phenergan 6.25 mg IV q6h prn for refractory nausea/vomiting, 2 doses ordered ? ? ?Hypophosphatemia, phos repleted. ? ?Hypokalemia, potassium repleted. ? ?  ?Generalized pain ?-No trauma was found, - Likely secondary to DKA ?- Ketorolac 15 mg IV q6h prn for moderate pain, 1 day ordered ?  ?High anion gap metabolic acidosis ?secondary to DKA, treat as above and resolved ?  ?HTN (hypertension) ?- Hydralazine 5 mg IV q6h prn for sbp >  165, 3 days ordered ?  ?Peripheral neuropathy ?- Patient states he has not been taking his gabapentin due to running out of medications ?- Prior prescribed gabapentin 400 mg TID, resumed ?  ?Tobacco abuse ?- Nicotine patch daily prn for nicotine craving ordered ?  ?Malnutrition of moderate degree (HCC) ?- Dietician has been consulted ? ? ? ?Body mass index is 20.6 kg/m?.  ?Interventions: ?  ? ?   ? ?Diet: Diabetic diet ?DVT Prophylaxis: Subcutaneous Lovenox  ? ?Advance goals of care discussion: Full code ? ?Family Communication: family not was present at bedside, at the time of interview.  ?The pt provided permission to discuss medical plan with the family. Opportunity was given to ask question and all questions were answered satisfactorily.  ? ?Disposition:  ?Pt is from Home, admitted with DKA, s/p insulin IV infusion, switch to subcu insulin, which precludes a safe discharge. ?Discharge to home, when clinically stable, most likely tomorrow a.m. ? ?Subjective: No significant overnight events, patient was admitted with DKA, anion gap closed, transition to subcu insulin now, patient was complaining of bilateral lower extremity pain and abdominal pain and was requesting for IV pain medication. ?Patient was advised to take Tylenol ?Patient denied any chest pain or palpitation, no shortness of breath. ? ?Physical Exam: ?General:  alert oriented to time, place, and person.  ?Appear in no distress, affect appropriate ?Eyes: PERRLA ?ENT: Oral Mucosa Clear, moist  ?Neck: no JVD,  ?  Cardiovascular: S1 and S2 Present, no Murmur,  ?Respiratory: good respiratory effort, Bilateral Air entry equal and Decreased, no Crackles, no wheezes ?Abdomen: Bowel Sound present, Soft and no tenderness,  ?Skin: no rashes ?Extremities: no Pedal edema, no calf tenderness ?Neurologic: without any new focal findings ?Gait not checked due to patient safety concerns ? ?Vitals:  ? 11/28/21 0930 11/28/21 1030 11/28/21 1045 11/28/21 1208  ?BP: (!)  140/98 122/74 128/83 (!) 151/98  ?Pulse: 85 73 81   ?Resp: 13 (!) 21 18 18   ?Temp:   98.4 ?F (36.9 ?C) 98.5 ?F (36.9 ?C)  ?TempSrc:   Oral Oral  ?SpO2: 100% 97% 100% 100%  ?Weight:      ?Height:      ? ? ?Intake/Output Summary (Last 24 hours) at 11/28/2021 1456 ?Last data filed at 11/28/2021 1400 ?Gross per 24 hour  ?Intake 1237.65 ml  ?Output 1600 ml  ?Net -362.35 ml  ? ?Filed Weights  ? 11/27/21 1604  ?Weight: 54.4 kg  ? ? ?Data Reviewed: ?I have personally reviewed and interpreted daily labs, tele strips, imagings as discussed above. ?I reviewed all nursing notes, pharmacy notes, vitals, pertinent old records ?I have discussed plan of care as described above with RN and patient/family. ? ?CBC: ?Recent Labs  ?Lab 11/27/21 ?1557  ?WBC 10.3  ?HGB 13.9  ?HCT 43.9  ?MCV 80.8  ?PLT 282  ? ?Basic Metabolic Panel: ?Recent Labs  ?Lab 11/27/21 ?2055 11/28/21 ?0030 11/28/21 ?01/28/22 11/28/21 ?1106 11/28/21 ?1108  ?NA 144 145 140 134* 135  ?K 3.8 3.6 3.6 3.3* 3.3*  ?CL 107 110 108 104 104  ?CO2 20* 24 27 25 25   ?GLUCOSE 360* 225* 189* 171* 173*  ?BUN 30* 32* 36* 34* 34*  ?CREATININE 1.43* 1.30* 1.21 1.08 1.17  ?CALCIUM 9.2 9.1 8.5* 8.5* 8.5*  ?MG  --   --  1.9  --   --   ?PHOS  --   --  2.2*  --   --   ? ? ?Studies: ?DG Chest Port 1 View ? ?Result Date: 11/27/2021 ?CLINICAL DATA:  High blood sugar EXAM: PORTABLE CHEST 1 VIEW COMPARISON:  01/05/2021 FINDINGS: The heart size and mediastinal contours are within normal limits. Both lungs are clear. The visualized skeletal structures are unremarkable. IMPRESSION: No active disease. Electronically Signed   By: 01/27/2022 M.D.   On: 11/27/2021 20:16    ?Scheduled Meds: ? enoxaparin (LOVENOX) injection  40 mg Subcutaneous Q24H  ? insulin aspart  0-15 Units Subcutaneous TID WC  ? insulin glargine-yfgn  30 Units Subcutaneous BID  ? ?Continuous Infusions: ? dextrose 5% lactated ringers 125 mL/hr at 11/28/21 1228  ? lactated ringers Stopped (11/27/21 2334)  ? promethazine (PHENERGAN)  injection (IM or IVPB)    ? ?PRN Meds: acetaminophen **OR** acetaminophen, dextrose, hydrALAZINE, ketorolac, nicotine, ondansetron (ZOFRAN) IV, promethazine (PHENERGAN) injection (IM or IVPB) ? ?Time spent: 35 minutes ? ?Author: ?01/27/22 MD ?[facility] ?11/28/2021 2:56 PM ? ?To reach On-call, see care teams to locate the attending and reach out to them via [email or web address]. ?If 7PM-7AM, please contact night-coverage ?If you still have difficulty reaching the attending provider, please page the [facility] (Director on Call) for [facility] on amion for assistance. ? ?

## 2021-11-28 NOTE — Progress Notes (Signed)
Nutrition Brief Note ? ?RD consulted for assessment of nutritional requirements/ status.  ? ?Wt Readings from Last 15 Encounters:  ?11/27/21 54.4 kg  ?05/10/21 59 kg  ?05/10/21 59 kg  ?04/20/21 53.4 kg  ?04/18/21 54.4 kg  ?01/05/21 54.4 kg  ?12/28/20 53.8 kg  ?03/03/20 59 kg  ?01/18/20 50.6 kg  ?10/29/19 54.4 kg  ?10/28/19 54.4 kg  ?09/13/19 54.4 kg  ?07/20/19 57.2 kg  ?06/08/19 61.2 kg  ?04/11/19 63.5 kg  ? ?Trevor Brown is a 31 year old male with history of insulin dependent diabetes mellitus type 1, neuropathy, history of heroin abuse, hypertension, history of noncompliance with medical treatment, who presents for chief concern of elevated blood glucose of 'high' at home.  ? ?Pt admitted with DKA.  ? ?Per DM coordinator note, pt with difficulty accessing medications PTA. He was using the medication management clinic in the past. TOC has been consulted.  ? ?Pt sleeping soundly at time of visit. He did not arouse for voice or touch.  ? ?Reviewed wt hx; wt has been stable over the past year.  ? ?Nutrition-Focused physical exam completed. Findings are no fat depletion, no muscle depletion, and no edema.   ? ?Lab Results  ?Component Value Date  ? HGBA1C 9.2 (H) 11/28/2021  ? PTA DM medications are 30 units insulin glargine BID and 5-10 units insulin lispro TID.  ? ?Labs reviewed: CBGS: 114-177 (inpatient orders for glycemic control are 0-15 units insulin aspart TID with meals and 30 units inuslin glargine-yfgn BID).   ? ?Current diet order is carb modified, patient is consuming approximately n/a% of meals at this time. Labs and medications reviewed.  ? ?No nutrition interventions warranted at this time. If nutrition issues arise, please consult RD.  ? ?Loistine Chance, RD, LDN, CDCES ?Registered Dietitian II ?Certified Diabetes Care and Education Specialist ?Please refer to [facility] for RD and/or RD on-call/weekend/after hours pager   ?

## 2021-11-28 NOTE — Progress Notes (Addendum)
Inpatient Diabetes Program Recommendations ? ?AACE/ADA: New Consensus Statement on Inpatient Glycemic Control (2015) ? ?Target Ranges:  Prepandial:   less than 140 mg/dL ?     Peak postprandial:   less than 180 mg/dL (1-2 hours) ?     Critically ill patients:  140 - 180 mg/dL  ? ?Lab Results  ?Component Value Date  ? GLUCAP 114 (H) 11/28/2021  ? HGBA1C 11.8 (H) 05/11/2021  ? ? ?Review of Glycemic Control ? Latest Reference Range & Units 11/28/21 04:37 11/28/21 05:46 11/28/21 06:54 11/28/21 09:17  ?Glucose-Capillary 70 - 99 mg/dL 885 (H) 027 (H) 741 (H) 114 (H)  ? ?Diabetes history: DM Type 1 ?Outpatient Diabetes medications:  ?Basaglar 30 units bid, Humalog 5-10 units tid with meals ?Current orders for Inpatient glycemic control:  ?Semglee 30 units bid ?Novolog 0-15 units tid with meals ? ?Inpatient Diabetes Program Recommendations:   ? ?Note patient admitted after "running out" of insulin.  It appears he was seen ad Medication management clinic on 09/11/21 and "paperwork" was needed in order for him to continue getting medications there.  TOC consult placed- Will follow.  ? ?Thanks,  ?Beryl Meager, RN, BC-ADM ?Inpatient Diabetes Coordinator ?Pager [phone]  (8a-5p) ? ?Addendum 1400- Spoke to patient at bedside.  He was very sleepy. Asked him if he is still getting medications from [facility] and he stated "yes", however he said that he did not have a ride to pick up insulin. Discussed importance of taking insulin and also congratulated patient for not being hospitalized recently.  He states he has been doing better.  ? ?

## 2021-11-29 LAB — CBC
HCT: 31 % — ABNORMAL LOW (ref 39.0–52.0)
Hemoglobin: 10.1 g/dL — ABNORMAL LOW (ref 13.0–17.0)
MCH: 25.6 pg — ABNORMAL LOW (ref 26.0–34.0)
MCHC: 32.6 g/dL (ref 30.0–36.0)
MCV: 78.7 fL — ABNORMAL LOW (ref 80.0–100.0)
Platelets: 209 10*3/uL (ref 150–400)
RBC: 3.94 MIL/uL — ABNORMAL LOW (ref 4.22–5.81)
RDW: 15.1 % (ref 11.5–15.5)
WBC: 8.9 10*3/uL (ref 4.0–10.5)
nRBC: 0 % (ref 0.0–0.2)

## 2021-11-29 LAB — BASIC METABOLIC PANEL
Anion gap: 4 — ABNORMAL LOW (ref 5–15)
BUN: 14 mg/dL (ref 6–20)
CO2: 28 mmol/L (ref 22–32)
Calcium: 8.3 mg/dL — ABNORMAL LOW (ref 8.9–10.3)
Chloride: 106 mmol/L (ref 98–111)
Creatinine, Ser: 0.89 mg/dL (ref 0.61–1.24)
GFR, Estimated: >60 mL/min (ref >60)
Glucose, Bld: 109 mg/dL — ABNORMAL HIGH (ref 70–99)
Potassium: 2.9 mmol/L — ABNORMAL LOW (ref 3.5–5.1)
Sodium: 138 mmol/L (ref 135–145)

## 2021-11-29 LAB — IRON AND TIBC
Iron: 37 ug/dL — ABNORMAL LOW (ref 45–182)
Saturation Ratios: 10 % — ABNORMAL LOW (ref 17.9–39.5)
TIBC: 354 ug/dL (ref 250–450)
UIBC: 317 ug/dL

## 2021-11-29 LAB — GLUCOSE, CAPILLARY
Glucose-Capillary: 121 mg/dL — ABNORMAL HIGH (ref 70–99)
Glucose-Capillary: 113 mg/dL — ABNORMAL HIGH (ref 70–99)
Glucose-Capillary: 339 mg/dL — ABNORMAL HIGH (ref 70–99)
Glucose-Capillary: 147 mg/dL — ABNORMAL HIGH (ref 70–99)

## 2021-11-29 LAB — FERRITIN: Ferritin: 10 ng/mL — ABNORMAL LOW (ref 24–336)

## 2021-11-29 LAB — PHOSPHORUS: Phosphorus: 3 mg/dL (ref 2.5–4.6)

## 2021-11-29 LAB — VITAMIN D 25 HYDROXY (VIT D DEFICIENCY, FRACTURES)
Vit D, 25-Hydroxy: 53.84 ng/mL (ref 30–100)
Vit D, 25-Hydroxy: 53.46 ng/mL (ref 30–100)

## 2021-11-29 LAB — FOLATE: Folate: 5 ng/mL — ABNORMAL LOW (ref >5.9)

## 2021-11-29 LAB — MAGNESIUM: Magnesium: 1.7 mg/dL (ref 1.7–2.4)

## 2021-11-29 MED ORDER — ACETAMINOPHEN 500 MG PO TABS
500.0000 mg | ORAL_TABLET | Freq: Three times a day (TID) | ORAL | Status: DC
Start: 2021-11-29 — End: 2021-11-30
  Administered 2021-11-29 – 2021-11-30 (×4): 500 mg via ORAL
  Filled 2021-11-29 (×4): qty 1

## 2021-11-29 MED ORDER — POTASSIUM CHLORIDE CRYS ER 20 MEQ PO TBCR
40.0000 meq | EXTENDED_RELEASE_TABLET | Freq: Once | ORAL | Status: AC
  Administered 2021-11-29: 40 meq via ORAL
  Filled 2021-11-29: qty 2

## 2021-11-29 MED ORDER — OXYCODONE-ACETAMINOPHEN 5-325 MG PO TABS
1.0000 | ORAL_TABLET | Freq: Three times a day (TID) | ORAL | Status: DC | PRN
  Administered 2021-11-29 – 2021-11-30 (×4): 1 via ORAL
  Filled 2021-11-29 (×4): qty 1

## 2021-11-29 MED ORDER — POTASSIUM CHLORIDE 10 MEQ/100ML IV SOLN
10.0000 meq | INTRAVENOUS | Status: AC
  Administered 2021-11-29 (×6): 10 meq via INTRAVENOUS
  Filled 2021-11-29 (×6): qty 100

## 2021-11-29 MED ORDER — FOLIC ACID 1 MG PO TABS
1.0000 mg | ORAL_TABLET | Freq: Every day | ORAL | Status: DC
  Administered 2021-11-29 – 2021-11-30 (×2): 1 mg via ORAL
  Filled 2021-11-29 (×2): qty 1

## 2021-11-29 NOTE — TOC Initial Note (Signed)
Transition of Care (TOC) - Initial/Assessment Note  ? ? ?Patient Details  ?Name: Trevor Brown ?MRN: [id number] ?Date of Birth: 03/01/1991 ? ?Transition of Care (TOC) CM/SW Contact:    ?Loretha Ure A Evon Lopezperez, LCSW ?Phone Number: ?11/29/2021, 2:59 PM ? ?Clinical Narrative: CSW spoke with pt regarding dc plan. Pt states he will not have a address to go to at d/c. However, when offered housing/shelter resources pt declined. Pt is receptive to a referral to [facility] as pt has no PCP. CSW will provide a application with dc ppwk- pt aware to fill out ahead of time. Pt also understanding that MD will send his meds to med management across the street and pt will have to get them at d/c. MD is aware to send meds to med management and meds have to be picked up before 4:00 PM as they close at 4:00 PM.  ? ?Referral sent to [facility] for follow up.                ? ? ?Expected Discharge Plan: Home/Self Care ?Barriers to Discharge: Continued Medical Work up ? ? ?Patient Goals and CMS Choice ?  ?  ?  ? ?Expected Discharge Plan and Services ?Expected Discharge Plan: Home/Self Care ?In-house Referral: Clinical Social Work ?Discharge Planning Services: [facility], Medication Assistance ?Post Acute Care Choice: NA ?Living arrangements for the past 2 months: Homeless (homesless, declining shetler resources) ?                ?  ?  ?  ?  ?  ?  ?  ?  ?  ?  ? ?Prior Living Arrangements/Services ?Living arrangements for the past 2 months: Homeless (homesless, declining shetler resources) ?  ?Patient language and need for interpreter reviewed:: Yes ?Do you feel safe going back to the place where you live?: Yes      ?Need for Family Participation in Patient Care: No (Comment) ?Care giver support system in place?: No (comment) ?  ?Criminal Activity/Legal Involvement Pertinent to Current Situation/Hospitalization: No - Comment as needed ? ?Activities of Daily Living ?Home Assistive Devices/Equipment: None ?ADL Screening  (condition at time of admission) ?Patient's cognitive ability adequate to safely complete daily activities?: Yes ?Is the patient deaf or have difficulty hearing?: No ?Does the patient have difficulty seeing, even when wearing glasses/contacts?: No ?Does the patient have difficulty concentrating, remembering, or making decisions?: No ?Patient able to express need for assistance with ADLs?: Yes ?Does the patient have difficulty dressing or bathing?: No ?Independently performs ADLs?: Yes (appropriate for developmental age) ?Does the patient have difficulty walking or climbing stairs?: No ?Weakness of Legs: None ?Weakness of Arms/Hands: None ? ?Permission Sought/Granted ?  ?  ?   ?   ?   ?   ? ?Emotional Assessment ?Appearance:: Appears stated age ?Attitude/Demeanor/Rapport: Engaged ?Affect (typically observed): Accepting ?Orientation: : Oriented to Self, Oriented to  Time, Oriented to Place, Oriented to Situation ?Alcohol / Substance Use: Not Applicable ?Psych Involvement: No (comment) ? ?Admission diagnosis:  DKA (diabetic ketoacidosis) ([place]) [E11.10] ?Diabetic ketoacidosis without coma associated with type 1 diabetes mellitus ([place]) [E10.10] ?Patient Active Problem List  ? Diagnosis Date Noted  ? High anion gap metabolic acidosis [id number]  ? Generalized pain 11/27/2021  ? Hyperglycemia due to type 1 diabetes mellitus ([place]) 05/11/2021  ? Non compliance w medication regimen 04/19/2021  ? HTN (hypertension) 01/05/2021  ? GERD (gastroesophageal reflux disease) 01/05/2021  ? SIRS (systemic inflammatory response syndrome) (HCC)  03/03/2020  ? Cellulitis of left hand 03/03/2020  ? Elevated blood pressure reading 01/18/2020  ? Hypokalemia   ? Sepsis without acute organ dysfunction (HCC)   ? Dental abscess 09/12/2019  ? Intractable vomiting   ? Hematemesis with nausea   ? Cannabis hyperemesis syndrome concurrent with and due to cannabis abuse ([place])   ? Elevated LFTs   ? Abnormal laboratory test result 07/20/2019  ? Smoking  07/20/2019  ? History of hepatitis C 02/25/2019  ? Health care maintenance 02/25/2019  ? Peripheral neuropathy 02/25/2019  ? Hand abscess 10/13/2018  ? IVDU (intravenous drug user) 10/11/2018  ? Hyperkalemia 10/11/2018  ? Cellulitis and abscess of hand 08/17/2018  ? Polysubstance abuse ([place]) 01/20/2018  ? Abdominal pain 06/29/2017  ? Hyponatremia 06/29/2017  ? DKA (diabetic ketoacidosis) ([place]) 06/29/2017  ? Dental erosion extending into pulp 12/31/2016  ? 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 Gastroenteritis 04/11/2016  ? Elevated transaminase level 03/27/2016  ? Diabetes mellitus type 1 ([place]) 03/27/2016  ? Tobacco abuse counseling 03/27/2016  ? Transaminitis 03/25/2016  ? Leukocytosis 01/13/2016  ? Cannabinoid hyperemesis syndrome 01/10/2016  ? Tobacco abuse 01/10/2016  ? Type 1 diabetes mellitus with hyperglycemia ([place]) 12/12/2015  ? Chest pain 12/12/2015  ? Cocaine abuse ([place]) 12/12/2015  ? DKA (diabetic ketoacidoses) 06/10/2015  ? Hidradenitis suppurativa of left axilla   ? Malnutrition of moderate degree ([place]) 04/08/2015  ? Hepatitis C 12/13/2014  ? Diabetes type 1, uncontrolled 12/11/2014  ? Major depressive disorder, single episode, mild ([place])   ? Major depression, single episode 12/10/2014  ? Diabetes mellitus type 1, uncontrolled 12/09/2014  ? Homelessness 12/09/2014  ? ?PCP:  Pcp, No ?Pharmacy:   ?[facility] ?[address] ?[place] [address] ?Phone: [phone] Fax: [phone] ? ?[facility] ?[address] ?[place] [address] ?Phone: [phone] Fax: [phone] ? ?[facility] [place] [facility] [place] ?[address] ([place]) [place] [address] ?Phone: [phone] Fax: [phone] ? ?[place] [place] [id number] ?[place] [address] ?Phone: [phone] Fax: [phone] ? ? ? ? ?Social Determinants of Health (SDOH)  Interventions ?  ? ?Readmission Risk Interventions ? ?  03/08/2020  ?  1:32 PM 03/08/2020  ?  1:17 PM  ?Readmission Risk Prevention Plan  ?Transportation Screening  Complete  ?PCP or Specialist Appt within 3-5 Days Complete   ?Palliative Care Screening  Not Applicable  ?Medication Review Press photographer)  Complete  ? ? ? ?

## 2021-11-29 NOTE — Progress Notes (Signed)
[facility] Progress Note ? ?Patient: Trevor Brown    WNI:[id number]  DOA: 11/27/2021    ? ?Date of Service: the patient was seen and examined on 11/29/2021 ? ?Chief Complaint  ?Patient presents with  ? Hyperglycemia  ? ?Brief hospital course: ?Mr. Buckley Bradly is a 31 year old male with history of insulin dependent diabetes mellitus type 1, neuropathy, history of heroin abuse, hypertension, history of noncompliance with medical treatment, who presents to the ED for chief concern of elevated blood glucose of 'high' at home.  ?  ?Initial vitals in the ED showed temp of 98.4, rr 18, hr 107, blood pressure 151/98, spO2 was 100% on RA.  ?  ?Serum sodium is 140, K 5.1, chloride 98, bicarb 16, bun 19, serum creatinin 1.48, nonfasting blood glucose is 493, wbc 10.3, hemoglobin 13.9, platelets 282.  ?  ?Anion gap elevated at 26. Beta-hydroxybutyrate is in process.  ?  ?Per report, patient ran out of his insulin over several days.  ?  ?ED treatment: started on insulin gtt and LR bolus of 1088 ml, NS 1 L bolus. Ondansetron 4 mg IV given.  ? ? ? ?Assessment and Plan: ?* DKA (diabetic ketoacidosis) (HCC) ?- Presumed secondary to patient 'running out of insulin' ?- Port chest x-rays, hs troponin, and ekg ordered and discussed with nursing staff at bedside ?- s/p insulin gtt and IV fluid ?- Resumed insulin glargine 30 u BID and insulin sliding scale TID AC ?- Consult placed to diabetes coordinato ?- Supportive measures including aggressive IV fluid, ondansetron 4 mg IV q6h prn for nausea, vomiting; phenergan 6.25 mg IV q6h prn for refractory nausea/vomiting, 2 doses ordered ? ? ?Hypophosphatemia, phos repleted. ? ?Hypokalemia, potassium repleted. ?K 2.9 repleted, repeat labs tomorrow a.m.  ?  ?Generalized pain ?-No trauma was found, - Likely secondary to DKA ?- Ketorolac 15 mg IV q6h prn for moderate pain, 1 day ordered ?  ?High anion gap metabolic acidosis ?secondary to DKA, treat as above and resolved ?  ?HTN  (hypertension) ?- Hydralazine 5 mg IV q6h prn for sbp > 165, 3 days ordered ?  ?Peripheral neuropathy ?- Patient states he has not been taking his gabapentin due to running out of medications ?- Prior prescribed gabapentin 400 mg TID, resumed ?  ?Tobacco abuse ?- Nicotine patch daily prn for nicotine craving ordered ?  ?Malnutrition of moderate degree (HCC) ?- Dietician has been consulted ? ?Folic acid deficiency, started folate supplement, follow with PCP to repeat folic acid level after 3 months. ? ? ?Body mass index is 20.6 kg/m?.  ?Interventions: ?  ? ?   ? ?Diet: Diabetic diet ?DVT Prophylaxis: Subcutaneous Lovenox  ? ?Advance goals of care discussion: Full code ? ?Family Communication: family not was present at bedside, at the time of interview.  ?The pt provided permission to discuss medical plan with the family. Opportunity was given to ask question and all questions were answered satisfactorily.  ? ?Disposition:  ?Pt is from Home, admitted with DKA, s/p insulin IV infusion, switch to subcu insulin, developed hyperkalemia today which precludes a safe discharge. ?Discharge to home, when clinically stable, most likely tomorrow a.m. ? ?Subjective: No significant overnight events, patient feels improvement in the generalized body ache, no any other active issues. ?Potassium is very low, replating IV followed by oral, patient agreed to stay overnight and plan for disposition tomorrow a.m. if electrolytes remain stable. ? ? ?Physical Exam: ?General:  alert oriented to time, place, and person.  ?Appear  in no distress, affect appropriate ?Eyes: PERRLA ?ENT: Oral Mucosa Clear, moist  ?Neck: no JVD,  ?Cardiovascular: S1 and S2 Present, no Murmur,  ?Respiratory: good respiratory effort, Bilateral Air entry equal and Decreased, no Crackles, no wheezes ?Abdomen: Bowel Sound present, Soft and no tenderness,  ?Skin: no rashes ?Extremities: no Pedal edema, no calf tenderness ?Neurologic: without any new focal findings ?Gait  not checked due to patient safety concerns ? ?Vitals:  ? 11/28/21 2024 11/29/21 0130 11/29/21 0430 11/29/21 0803  ?BP: 120/72 (!) 139/95 138/85 125/82  ?Pulse: 81 78 66 64  ?Resp: 18 18 18 18   ?Temp: 98.2 ?F (36.8 ?C) 98.2 ?F (36.8 ?C) 98 ?F (36.7 ?C) 98.2 ?F (36.8 ?C)  ?TempSrc: Oral Oral  Oral  ?SpO2: 100% 100% 98% 99%  ?Weight:      ?Height:      ? ? ?Intake/Output Summary (Last 24 hours) at 11/29/2021 1404 ?Last data filed at 11/29/2021 0941 ?Gross per 24 hour  ?Intake 2422.14 ml  ?Output 1875 ml  ?Net 547.14 ml  ? ?Filed Weights  ? 11/27/21 1604  ?Weight: 54.4 kg  ? ? ?Data Reviewed: ?I have personally reviewed and interpreted daily labs, tele strips, imagings as discussed above. ?I reviewed all nursing notes, pharmacy notes, vitals, pertinent old records ?I have discussed plan of care as described above with RN and patient/family. ? ?CBC: ?Recent Labs  ?Lab 11/27/21 ?1557 11/29/21 ?01/29/22  ?WBC 10.3 8.9  ?HGB 13.9 10.1*  ?HCT 43.9 31.0*  ?MCV 80.8 78.7*  ?PLT 282 209  ? ?Basic Metabolic Panel: ?Recent Labs  ?Lab 11/28/21 ?0030 11/28/21 ?01/28/22 11/28/21 ?1106 11/28/21 ?1108 11/29/21 ?01/29/22  ?NA 145 140 134* 135 138  ?K 3.6 3.6 3.3* 3.3* 2.9*  ?CL 110 108 104 104 106  ?CO2 24 27 25 25 28   ?GLUCOSE 225* 189* 171* 173* 109*  ?BUN 32* 36* 34* 34* 14  ?CREATININE 1.30* 1.21 1.08 1.17 0.89  ?CALCIUM 9.1 8.5* 8.5* 8.5* 8.3*  ?MG  --  1.9  --   --  1.7  ?PHOS  --  2.2*  --   --  3.0  ? ? ?Studies: ?No results found.  ?Scheduled Meds: ? acetaminophen  500 mg Oral Q8H  ? enoxaparin (LOVENOX) injection  40 mg Subcutaneous Q24H  ? feeding supplement (GLUCERNA SHAKE)  237 mL Oral TID BM  ? gabapentin  400 mg Oral TID  ? insulin aspart  0-15 Units Subcutaneous TID WC  ? insulin glargine-yfgn  30 Units Subcutaneous BID  ? potassium chloride  40 mEq Oral Once  ? ?Continuous Infusions: ? dextrose 5% lactated ringers 125 mL/hr at 11/29/21 0430  ? potassium chloride 10 mEq (11/29/21 1339)  ? promethazine (PHENERGAN) injection (IM or  IVPB)    ? ?PRN Meds: dextrose, hydrALAZINE, nicotine, ondansetron (ZOFRAN) IV, oxyCODONE-acetaminophen, promethazine (PHENERGAN) injection (IM or IVPB) ? ?Time spent: 35 minutes ? ?Author: ?01/29/22 MD ?[facility] ?11/29/2021 2:04 PM ? ?To reach On-call, see care teams to locate the attending and reach out to them via [email or web address]. ?If 7PM-7AM, please contact night-coverage ?If you still have difficulty reaching the attending provider, please page the [facility] (Director on Call) for [facility] on amion for assistance. ? ?

## 2021-11-29 NOTE — Progress Notes (Signed)
[facility] application paper was printed by CM and was given to pt by RN. ?

## 2021-11-30 ENCOUNTER — Other Ambulatory Visit: Payer: Self-pay

## 2021-11-30 LAB — CBC
HCT: 35.4 % — ABNORMAL LOW (ref 39.0–52.0)
Hemoglobin: 11.4 g/dL — ABNORMAL LOW (ref 13.0–17.0)
MCH: 25.1 pg — ABNORMAL LOW (ref 26.0–34.0)
MCHC: 32.2 g/dL (ref 30.0–36.0)
MCV: 77.8 fL — ABNORMAL LOW (ref 80.0–100.0)
Platelets: 203 10*3/uL (ref 150–400)
RBC: 4.55 MIL/uL (ref 4.22–5.81)
RDW: 15.4 % (ref 11.5–15.5)
WBC: 7.1 10*3/uL (ref 4.0–10.5)
nRBC: 0 % (ref 0.0–0.2)

## 2021-11-30 LAB — BASIC METABOLIC PANEL
Anion gap: 6 (ref 5–15)
BUN: 12 mg/dL (ref 6–20)
CO2: 24 mmol/L (ref 22–32)
Calcium: 8.9 mg/dL (ref 8.9–10.3)
Chloride: 107 mmol/L (ref 98–111)
Creatinine, Ser: 0.95 mg/dL (ref 0.61–1.24)
GFR, Estimated: >60 mL/min (ref >60)
Glucose, Bld: 194 mg/dL — ABNORMAL HIGH (ref 70–99)
Potassium: 3.8 mmol/L (ref 3.5–5.1)
Sodium: 137 mmol/L (ref 135–145)

## 2021-11-30 LAB — MAGNESIUM: Magnesium: 2 mg/dL (ref 1.7–2.4)

## 2021-11-30 LAB — PHOSPHORUS: Phosphorus: 3 mg/dL (ref 2.5–4.6)

## 2021-11-30 LAB — GLUCOSE, CAPILLARY
Glucose-Capillary: 255 mg/dL — ABNORMAL HIGH (ref 70–99)
Glucose-Capillary: 171 mg/dL — ABNORMAL HIGH (ref 70–99)

## 2021-11-30 MED ORDER — INSULIN PEN NEEDLE 32G X 4 MM MISC
1 refills | Status: DC
  Filled 2021-11-30: qty 100, 20d supply, fill #0

## 2021-11-30 MED ORDER — FOLIC ACID 1 MG PO TABS
1.0000 mg | ORAL_TABLET | Freq: Every day | ORAL | 2 refills | Status: AC
  Filled 2021-11-30: qty 30, 30d supply, fill #0

## 2021-11-30 MED ORDER — GABAPENTIN 400 MG PO CAPS
400.0000 mg | ORAL_CAPSULE | Freq: Two times a day (BID) | ORAL | 0 refills | Status: DC
  Filled 2021-11-30: qty 60, 30d supply, fill #0

## 2021-11-30 MED ORDER — PANTOPRAZOLE SODIUM 40 MG PO TBEC
40.0000 mg | DELAYED_RELEASE_TABLET | Freq: Every day | ORAL | 2 refills | Status: DC
Start: 2021-11-30 — End: 2022-05-20
  Filled 2021-11-30: qty 30, 30d supply, fill #0

## 2021-11-30 MED ORDER — INSULIN LISPRO (1 UNIT DIAL) 100 UNIT/ML (KWIKPEN)
5.0000 [IU] | PEN_INJECTOR | Freq: Three times a day (TID) | SUBCUTANEOUS | 3 refills | Status: DC
  Filled 2021-11-30: qty 15, 50d supply, fill #0

## 2021-11-30 MED ORDER — SODIUM CHLORIDE 0.9 % IV SOLN
500.0000 mg | Freq: Once | INTRAVENOUS | Status: AC
  Administered 2021-11-30: 500 mg via INTRAVENOUS
  Filled 2021-11-30: qty 25

## 2021-11-30 MED ORDER — BASAGLAR KWIKPEN 100 UNIT/ML ~~LOC~~ SOPN
30.0000 [IU] | PEN_INJECTOR | Freq: Two times a day (BID) | SUBCUTANEOUS | 10 refills | Status: DC
  Filled 2021-11-30: qty 15, 25d supply, fill #0

## 2021-11-30 MED ORDER — FERROUS SULFATE 325 (65 FE) MG PO TABS
325.0000 mg | ORAL_TABLET | Freq: Two times a day (BID) | ORAL | 2 refills | Status: DC
  Filled 2021-11-30: qty 60, 30d supply, fill #0

## 2021-11-30 NOTE — Discharge Summary (Addendum)
[facility] Discharge Summary ? ? ?Patient: Trevor Brown  PCP: Pcp, No  ?Date of admission: 11/27/2021   Date of discharge:  11/30/2021   ?  ?Discharge Diagnoses:  ?Principal Problem: ?  DKA (diabetic ketoacidosis) ([place]) ?Active Problems: ?  Malnutrition of moderate degree ([place]) ?  Type 1 diabetes mellitus with hyperglycemia ([facility]) ?  Tobacco abuse ?  Diabetes mellitus type 1 ([place]) ?  Peripheral neuropathy ?  HTN (hypertension) ?  GERD (gastroesophageal reflux disease) ?  High anion gap metabolic acidosis ?  Generalized pain ? ? ?Admitted From: Home ?Disposition:  Home ? ?Recommendations for Outpatient Follow-up:  ?PCP: in 1 wk ?Follow up LABS/TEST: Iron profile and folate level after 3 months ? ? ?Diet recommendation: Carb modified diet ? ?Activity: The patient is advised to gradually reintroduce usual activities, as tolerated ? ?Discharge Condition: stable ? ?Code Status: Full code  ? ?History of present illness: As per the H and P dictated on admission ?Hospital Course:  ?Trevor Brown is a 31 year old male with history of insulin dependent diabetes mellitus type 1, neuropathy, history of heroin abuse, hypertension, history of noncompliance with medical treatment, who presents to the ED for chief concern of elevated blood glucose of 'high' at home.  ?Initial vitals in the ED showed temp of 98.4, rr 18, hr 107, blood pressure 151/98, spO2 was 100% on RA.  ?Labs:Serum sodium is 140, K 5.1, chloride 98, bicarb 16, bun 19, serum creatinin 1.48, nonfasting blood glucose is 493, wbc 10.3, hemoglobin 13.9, platelets 282.  ?Anion gap elevated at 26. Beta-hydroxybutyrate is in process.  ?Per report, patient ran out of his insulin over several days.  ?ED treatment: started on insulin gtt and LR bolus of 1088 ml, NS 1 L bolus. Ondansetron 4 mg IV given.  ?  ?Assessment and Plan: ?* DKA (diabetic ketoacidosis, Presumed secondary to patient 'running out of insulin. Port chest x-rays, hs troponin, and ekg  ordered and discussed with nursing staff at bedside. s/p insulin gtt and IV fluid. Resumed insulin glargine 30 u BID and insulin sliding scale TID AC, Consult placed to diabetes coordinator. Supportive measures including aggressive IV fluid, ondansetron 4 mg IV q6h prn for nausea, vomiting; phenergan 6.25 mg IV q6h prn for refractory nausea/vomiting, 2 doses ordered.  During hospital stay blood sugar was stabilized, patient was given prescription of home regimen and advised to continue to monitor FSBG and follow with PCP as an outpatient for further management ?Hypophosphatemia, phos repleted. ?Hypokalemia, potassium repleted., K 2.9 repleted ?Generalized pain, No trauma was found, likely secondary to DKA, s/p Ketorolac 15 mg IV q6h prn for moderate pain, 1 day ordered, patient was given oxycodone for pain control as well, patient was advised to take Tylenol prn as an outpatient. ?High anion gap metabolic acidosis, secondary to DKA, treat as above and resolved ?HTN (hypertension) s/p Hydralazine 5 mg IV q6h prn for sbp > 165, 3 days ordered ?Peripheral neuropathy, Patient states he has not been taking his gabapentin due to running out of medications, Prior prescribed gabapentin 400 mg BID resumed ?Tobacco abuse, s/p Nicotine patch daily, smoking cessation counseling done.  ?Malnutrition ruled out, Dietician was consulted  ?Folic acid deficiency, started folate supplement, follow with PCP to repeat folic acid level after 3 months. ?Iron deficiency, iron saturation 10%, patient was given Venofer 5 mg x 1 dose before discharge and started on oral supplement.  Follow with PCP to repeat iron profile after 3 months. ?Body mass  index is 20.6 kg/m?.  ?Interventions: ? ?- Patient was instructed, not to drive, operate heavy machinery, perform activities at heights, swimming or participation in water activities or provide baby sitting services while on Pain, Sleep and Anxiety Medications; until his outpatient Physician has  advised to do so again.  ?- Also recommended to not to take more than prescribed Pain, Sleep and Anxiety Medications. ? ?Patient was ambulatory without any assistance. ?On the day of the discharge the patient's vitals were stable, and no other acute medical condition were reported by patient. the patient was felt safe to be discharge at Home. ? ?Consultants: None ?Procedures: none ? ?Discharge Exam: ?General: Appear in no distress, no Rash; Oral Mucosa Clear, moist. ?Cardiovascular: S1 and S2 Present, no Murmur, ?Respiratory: normal respiratory effort, Bilateral Air entry present and no Crackles, no wheezes ?Abdomen: Bowel Sound present, Soft and no tenderness, no hernia ?Extremities: no Pedal edema, no calf tenderness ?Neurology: alert and oriented to time, place, and person ?affect appropriate. ? Danley Danker Weights  ? 11/27/21 1604  ?Weight: 54.4 kg  ? ?Vitals:  ? 11/29/21 2127 11/30/21 0500  ?BP: (!) 152/100 (!) 131/96  ?Pulse: 66 80  ?Resp: 18   ?Temp: 98.3 ?F (36.8 ?C) 98 ?F (36.7 ?C)  ?SpO2: 97% 100%  ? ? ?DISCHARGE MEDICATION: ?Allergies as of 11/30/2021   ? ?   Reactions  ? Bee Venom Anaphylaxis  ? Ibuprofen Itching, Rash, Other (See Comments)  ? Other reaction(s): Other (See Comments) ?Stomach upset ?Reaction:  GI upset   ? Tramadol Hives, Swelling  ? Vancomycin Rash, Other (See Comments)  ? Reaction:  Red man's syndrome   ? ?  ? ?  ?Medication List  ?  ? ?STOP taking these medications   ? ?chlorhexidine 0.12 % solution ?Commonly known as: PERIDEX ?  ?clindamycin 150 MG capsule ?Commonly known as: CLEOCIN ?  ?HYDROcodone-acetaminophen 5-325 MG tablet ?Commonly known as: NORCO/VICODIN ?  ? ?  ? ?TAKE these medications   ? ?Basaglar KwikPen 100 UNIT/ML ?Inject 30 Units into the skin 2 (two) times daily. ?What changed: Another medication with the same name was removed. Continue taking this medication, and follow the directions you see here. ?  ?Comfort EZ Pen Needles 32G X 4 MM Misc ?Generic drug: Insulin Pen  Needle ?USE AS DIRECTED WITH INSULIN PENS. ?  ?ferrous sulfate 325 (65 FE) MG EC tablet ?Take 1 tablet (325 mg total) by mouth 2 (two) times daily. ?  ?folic acid 1 MG tablet ?Commonly known as: FOLVITE ?Take 1 tablet (1 mg total) by mouth daily. ?Start taking on: Dec 01, 2021 ?  ?FREESTYLE LITE test strip ?Generic drug: glucose blood ?To be used as directed for checking her blood sugar ?  ?Rightest GS550 Blood Glucose test strip ?Generic drug: glucose blood ?USE AS DIRECTED UP TO 4 TIMES DAILY. ?  ?gabapentin 400 MG capsule ?Commonly known as: NEURONTIN ?Take 1 capsule (400 mg total) by mouth 2 (two) times daily. ?What changed: when to take this ?  ?insulin lispro 100 UNIT/ML KwikPen ?Commonly known as: HumaLOG KwikPen ?Inject 5-10 Units into the skin 3 (three) times daily. ?What changed:  ?how much to take ?when to take this ?  ?Rightest GL300 Lancets Misc ?USE AS DIRECTED UP TO 4 TIMES DAILY. ?  ?Rightest GM550 Blood Glucose w/Device Kit ?USE AS DIRECTED UP TO 4 TIMES DAILY. ?  ? ?  ? ?Allergies  ?Allergen Reactions  ? Bee Venom Anaphylaxis  ? Ibuprofen Itching, Rash and  Other (See Comments)  ?  Other reaction(s): Other (See Comments) ?Stomach upset ?Reaction:  GI upset   ? Tramadol Hives and Swelling  ? Vancomycin Rash and Other (See Comments)  ?  Reaction:  Red man's syndrome   ? ?Discharge Instructions   ? ? Call MD for:  extreme fatigue   Complete by: As directed ?  ? Call MD for:  persistant dizziness or light-headedness   Complete by: As directed ?  ? Call MD for:  persistant nausea and vomiting   Complete by: As directed ?  ? Call MD for:  severe uncontrolled pain   Complete by: As directed ?  ? Call MD for:  temperature >100.4   Complete by: As directed ?  ? Diet - low sodium heart healthy   Complete by: As directed ?  ? Discharge instructions   Complete by: As directed ?  ? Follow-up with PCP in 1 week, repeat iron profile and folic acid level after 3 months  ? Increase activity slowly   Complete by: As  directed ?  ? ?  ? ? ?The results of significant diagnostics from this hospitalization (including imaging, microbiology, ancillary and laboratory) are listed below for reference.   ? ?Significant Diagnostic St

## 2021-12-04 ENCOUNTER — Other Ambulatory Visit: Payer: Self-pay

## 2021-12-04 ENCOUNTER — Telehealth: Payer: Self-pay | Admitting: Emergency Medicine

## 2021-12-04 NOTE — Telephone Encounter (Signed)
-----   Message from Rolm Gala, NP sent at 11/29/2021  3:39 PM EDT ----- ?Pls schedule an in person appointment for patient, and make telephone note. Thank you ? ?

## 2021-12-04 NOTE — Telephone Encounter (Signed)
Called patient at ph# listed. Patient does not live there. ?

## 2022-04-22 ENCOUNTER — Telehealth: Payer: Self-pay

## 2022-04-22 NOTE — Telephone Encounter (Signed)
Called pt to schedule appt per Benjamine Mola. Pt does not live at the phone number in system. No other numbers to call.

## 2022-05-14 ENCOUNTER — Other Ambulatory Visit: Payer: Self-pay

## 2022-05-14 ENCOUNTER — Inpatient Hospital Stay: Payer: Self-pay

## 2022-05-14 ENCOUNTER — Inpatient Hospital Stay
Admission: EM | Admit: 2022-05-14 | Discharge: 2022-05-20 | DRG: 602 | Disposition: A | Discharge status: Home / Self Care | Payer: Self-pay | Attending: Internal Medicine | Admitting: Internal Medicine

## 2022-05-14 ENCOUNTER — Emergency Department: Payer: Self-pay

## 2022-05-14 DIAGNOSIS — Z91148 Patient's other noncompliance with medication regimen for other reason: Secondary | ICD-10-CM

## 2022-05-14 DIAGNOSIS — Z79899 Other long term (current) drug therapy: Secondary | ICD-10-CM

## 2022-05-14 DIAGNOSIS — Z8619 Personal history of other infectious and parasitic diseases: Secondary | ICD-10-CM | POA: Diagnosis present

## 2022-05-14 DIAGNOSIS — Z8249 Family history of ischemic heart disease and other diseases of the circulatory system: Secondary | ICD-10-CM

## 2022-05-14 DIAGNOSIS — L03114 Cellulitis of left upper limb: Secondary | ICD-10-CM | POA: Diagnosis present

## 2022-05-14 DIAGNOSIS — R030 Elevated blood-pressure reading, without diagnosis of hypertension: Secondary | ICD-10-CM | POA: Diagnosis present

## 2022-05-14 DIAGNOSIS — Z794 Long term (current) use of insulin: Secondary | ICD-10-CM

## 2022-05-14 DIAGNOSIS — I252 Old myocardial infarction: Secondary | ICD-10-CM

## 2022-05-14 DIAGNOSIS — Z886 Allergy status to analgesic agent status: Secondary | ICD-10-CM

## 2022-05-14 DIAGNOSIS — K219 Gastro-esophageal reflux disease without esophagitis: Secondary | ICD-10-CM | POA: Diagnosis present

## 2022-05-14 DIAGNOSIS — F1110 Opioid abuse, uncomplicated: Secondary | ICD-10-CM | POA: Diagnosis present

## 2022-05-14 DIAGNOSIS — Z681 Body mass index (BMI) 19 or less, adult: Secondary | ICD-10-CM

## 2022-05-14 DIAGNOSIS — I10 Essential (primary) hypertension: Secondary | ICD-10-CM | POA: Diagnosis present

## 2022-05-14 DIAGNOSIS — Z597 Insufficient social insurance and welfare support: Secondary | ICD-10-CM

## 2022-05-14 DIAGNOSIS — E440 Moderate protein-calorie malnutrition: Secondary | ICD-10-CM | POA: Diagnosis present

## 2022-05-14 DIAGNOSIS — L02414 Cutaneous abscess of left upper limb: Principal | ICD-10-CM | POA: Diagnosis present

## 2022-05-14 DIAGNOSIS — Z5900 Homelessness unspecified: Secondary | ICD-10-CM

## 2022-05-14 DIAGNOSIS — E109 Type 1 diabetes mellitus without complications: Secondary | ICD-10-CM | POA: Diagnosis present

## 2022-05-14 DIAGNOSIS — E1065 Type 1 diabetes mellitus with hyperglycemia: Secondary | ICD-10-CM

## 2022-05-14 DIAGNOSIS — E1010 Type 1 diabetes mellitus with ketoacidosis without coma: Secondary | ICD-10-CM | POA: Diagnosis present

## 2022-05-14 DIAGNOSIS — Z91030 Bee allergy status: Secondary | ICD-10-CM

## 2022-05-14 DIAGNOSIS — F1410 Cocaine abuse, uncomplicated: Secondary | ICD-10-CM | POA: Diagnosis present

## 2022-05-14 DIAGNOSIS — A419 Sepsis, unspecified organism: Secondary | ICD-10-CM

## 2022-05-14 DIAGNOSIS — F17210 Nicotine dependence, cigarettes, uncomplicated: Secondary | ICD-10-CM | POA: Diagnosis present

## 2022-05-14 DIAGNOSIS — R739 Hyperglycemia, unspecified: Secondary | ICD-10-CM

## 2022-05-14 DIAGNOSIS — R64 Cachexia: Secondary | ICD-10-CM | POA: Diagnosis present

## 2022-05-14 DIAGNOSIS — Z833 Family history of diabetes mellitus: Secondary | ICD-10-CM

## 2022-05-14 DIAGNOSIS — Z881 Allergy status to other antibiotic agents status: Secondary | ICD-10-CM

## 2022-05-14 DIAGNOSIS — B1920 Unspecified viral hepatitis C without hepatic coma: Secondary | ICD-10-CM | POA: Diagnosis present

## 2022-05-14 DIAGNOSIS — Z720 Tobacco use: Secondary | ICD-10-CM | POA: Diagnosis present

## 2022-05-14 DIAGNOSIS — F1910 Other psychoactive substance abuse, uncomplicated: Secondary | ICD-10-CM | POA: Diagnosis present

## 2022-05-14 DIAGNOSIS — Z8614 Personal history of Methicillin resistant Staphylococcus aureus infection: Secondary | ICD-10-CM

## 2022-05-14 LAB — CBC WITH DIFFERENTIAL/PLATELET
Abs Immature Granulocytes: 0.07 10*3/uL (ref 0.00–0.07)
Basophils Absolute: 0 10*3/uL (ref 0.0–0.1)
Basophils Relative: 0 %
Eosinophils Absolute: 0 10*3/uL (ref 0.0–0.5)
Eosinophils Relative: 0 %
HCT: 36.7 % — ABNORMAL LOW (ref 39.0–52.0)
Hemoglobin: 12.1 g/dL — ABNORMAL LOW (ref 13.0–17.0)
Immature Granulocytes: 1 %
Lymphocytes Relative: 8 %
Lymphs Abs: 1.1 10*3/uL (ref 0.7–4.0)
MCH: 28.9 pg (ref 26.0–34.0)
MCHC: 33 g/dL (ref 30.0–36.0)
MCV: 87.6 fL (ref 80.0–100.0)
Monocytes Absolute: 0.9 10*3/uL (ref 0.1–1.0)
Monocytes Relative: 6 %
Neutro Abs: 12.4 10*3/uL — ABNORMAL HIGH (ref 1.7–7.7)
Neutrophils Relative %: 85 %
Platelets: 279 10*3/uL (ref 150–400)
RBC: 4.19 MIL/uL — ABNORMAL LOW (ref 4.22–5.81)
RDW: 12.5 % (ref 11.5–15.5)
WBC: 14.5 10*3/uL — ABNORMAL HIGH (ref 4.0–10.5)
nRBC: 0 % (ref 0.0–0.2)

## 2022-05-14 LAB — OSMOLALITY: Osmolality: 295 mOsm/kg (ref 275–295)

## 2022-05-14 LAB — BASIC METABOLIC PANEL
Anion gap: 16 — ABNORMAL HIGH (ref 5–15)
BUN: 18 mg/dL (ref 6–20)
CO2: 31 mmol/L (ref 22–32)
Calcium: 9 mg/dL (ref 8.9–10.3)
Chloride: 79 mmol/L — ABNORMAL LOW (ref 98–111)
Creatinine, Ser: 1.06 mg/dL (ref 0.61–1.24)
GFR, Estimated: >60 mL/min (ref >60)
Glucose, Bld: 611 mg/dL (ref 70–99)
Potassium: 4.1 mmol/L (ref 3.5–5.1)
Sodium: 126 mmol/L — ABNORMAL LOW (ref 135–145)

## 2022-05-14 LAB — URINALYSIS, ROUTINE W REFLEX MICROSCOPIC
Bacteria, UA: NONE SEEN
Bilirubin Urine: NEGATIVE
Glucose, UA: >=500 mg/dL — AB
Hgb urine dipstick: NEGATIVE
Ketones, ur: NEGATIVE mg/dL
Leukocytes,Ua: NEGATIVE
Nitrite: NEGATIVE
Protein, ur: NEGATIVE mg/dL
Specific Gravity, Urine: 1.03 (ref 1.005–1.030)
Squamous Epithelial / HPF: NONE SEEN (ref 0–5)
pH: 8 (ref 5.0–8.0)

## 2022-05-14 LAB — GLUCOSE, CAPILLARY: Glucose-Capillary: 214 mg/dL — ABNORMAL HIGH (ref 70–99)

## 2022-05-14 LAB — LACTIC ACID, PLASMA
Lactic Acid, Venous: 4.6 mmol/L (ref 0.5–1.9)
Lactic Acid, Venous: 1.1 mmol/L (ref 0.5–1.9)

## 2022-05-14 LAB — CBG MONITORING, ED
Glucose-Capillary: 493 mg/dL — ABNORMAL HIGH (ref 70–99)
Glucose-Capillary: 232 mg/dL — ABNORMAL HIGH (ref 70–99)

## 2022-05-14 LAB — URINE DRUG SCREEN, QUALITATIVE (ARMC ONLY)
Amphetamines, Ur Screen: NOT DETECTED
Barbiturates, Ur Screen: NOT DETECTED
Benzodiazepine, Ur Scrn: NOT DETECTED
Cannabinoid 50 Ng, Ur ~~LOC~~: POSITIVE — AB
Cocaine Metabolite,Ur ~~LOC~~: NOT DETECTED
MDMA (Ecstasy)Ur Screen: NOT DETECTED
Methadone Scn, Ur: NOT DETECTED
Opiate, Ur Screen: POSITIVE — AB
Phencyclidine (PCP) Ur S: NOT DETECTED
Tricyclic, Ur Screen: NOT DETECTED

## 2022-05-14 LAB — BLOOD GAS, VENOUS
Acid-Base Excess: 17.4 mmol/L — ABNORMAL HIGH (ref 0.0–2.0)
Bicarbonate: 44.4 mmol/L — ABNORMAL HIGH (ref 20.0–28.0)
O2 Saturation: 33.5 %
Patient temperature: 37
pCO2, Ven: 61 mmHg — ABNORMAL HIGH (ref 44–60)
pH, Ven: 7.47 — ABNORMAL HIGH (ref 7.25–7.43)
pO2, Ven: <31 mmHg — CL (ref 32–45)

## 2022-05-14 LAB — BETA-HYDROXYBUTYRIC ACID: Beta-Hydroxybutyric Acid: 0.21 mmol/L (ref 0.05–0.27)

## 2022-05-14 MED ORDER — MORPHINE SULFATE (PF) 4 MG/ML IV SOLN
4.0000 mg | Freq: Once | INTRAVENOUS | Status: AC
  Administered 2022-05-14: 4 mg via INTRAVENOUS
  Filled 2022-05-14: qty 1

## 2022-05-14 MED ORDER — GADOBUTROL 1 MMOL/ML IV SOLN
4.0000 mL | Freq: Once | INTRAVENOUS | Status: AC | PRN
  Administered 2022-05-15: 4 mL via INTRAVENOUS

## 2022-05-14 MED ORDER — SODIUM CHLORIDE 0.9 % IV SOLN
2.0000 g | Freq: Three times a day (TID) | INTRAVENOUS | Status: DC
  Administered 2022-05-15 – 2022-05-19 (×12): 2 g via INTRAVENOUS
  Filled 2022-05-14 (×4): qty 2
  Filled 2022-05-14 (×2): qty 12.5
  Filled 2022-05-14 (×2): qty 2
  Filled 2022-05-14 (×2): qty 12.5
  Filled 2022-05-14 (×2): qty 2
  Filled 2022-05-14: qty 12.5
  Filled 2022-05-14 (×3): qty 2

## 2022-05-14 MED ORDER — ONDANSETRON HCL 4 MG/2ML IJ SOLN
4.0000 mg | Freq: Four times a day (QID) | INTRAMUSCULAR | Status: DC | PRN
  Administered 2022-05-19: 4 mg via INTRAVENOUS
  Filled 2022-05-14: qty 2

## 2022-05-14 MED ORDER — VANCOMYCIN HCL 500 MG/100ML IV SOLN
500.0000 mg | Freq: Two times a day (BID) | INTRAVENOUS | Status: DC
  Administered 2022-05-15 – 2022-05-16 (×3): 500 mg via INTRAVENOUS
  Filled 2022-05-14 (×3): qty 100

## 2022-05-14 MED ORDER — ONDANSETRON HCL 4 MG PO TABS: 4.0000 mg | ORAL_TABLET | Freq: Four times a day (QID) | ORAL | Status: DC | PRN

## 2022-05-14 MED ORDER — INSULIN ASPART 100 UNIT/ML IJ SOLN
0.0000 [IU] | Freq: Every day | INTRAMUSCULAR | Status: DC
  Administered 2022-05-14: 2 [IU] via SUBCUTANEOUS
  Filled 2022-05-14: qty 1

## 2022-05-14 MED ORDER — ONDANSETRON HCL 4 MG/2ML IJ SOLN
4.0000 mg | Freq: Once | INTRAMUSCULAR | Status: AC
  Administered 2022-05-14: 4 mg via INTRAVENOUS
  Filled 2022-05-14: qty 2

## 2022-05-14 MED ORDER — HYDROMORPHONE HCL 1 MG/ML IJ SOLN
1.0000 mg | INTRAMUSCULAR | Status: DC | PRN
  Administered 2022-05-14 – 2022-05-15 (×6): 1 mg via INTRAVENOUS
  Filled 2022-05-14 (×5): qty 1

## 2022-05-14 MED ORDER — SODIUM CHLORIDE 0.9 % IV BOLUS
2000.0000 mL | Freq: Once | INTRAVENOUS | Status: AC
  Administered 2022-05-14: 2000 mL via INTRAVENOUS

## 2022-05-14 MED ORDER — VANCOMYCIN HCL IN DEXTROSE 1-5 GM/200ML-% IV SOLN
1000.0000 mg | Freq: Once | INTRAVENOUS | Status: AC
  Administered 2022-05-14: 1000 mg via INTRAVENOUS
  Filled 2022-05-14: qty 200

## 2022-05-14 MED ORDER — INSULIN ASPART 100 UNIT/ML IJ SOLN
0.0000 [IU] | Freq: Three times a day (TID) | INTRAMUSCULAR | Status: DC
  Administered 2022-05-15: 11 [IU] via SUBCUTANEOUS
  Administered 2022-05-15: 4 [IU] via SUBCUTANEOUS
  Filled 2022-05-14 (×2): qty 1

## 2022-05-14 MED ORDER — LACTATED RINGERS IV SOLN: INTRAVENOUS | Status: DC

## 2022-05-14 MED ORDER — INSULIN ASPART 100 UNIT/ML IJ SOLN
15.0000 [IU] | Freq: Once | INTRAMUSCULAR | Status: AC
  Administered 2022-05-14: 15 [IU] via INTRAVENOUS
  Filled 2022-05-14: qty 1

## 2022-05-14 MED ORDER — DIPHENHYDRAMINE HCL 50 MG/ML IJ SOLN
50.0000 mg | Freq: Once | INTRAMUSCULAR | Status: AC
  Administered 2022-05-14: 50 mg via INTRAVENOUS
  Filled 2022-05-14: qty 1

## 2022-05-14 MED ORDER — ENOXAPARIN SODIUM 40 MG/0.4ML IJ SOSY
40.0000 mg | PREFILLED_SYRINGE | INTRAMUSCULAR | Status: DC
  Filled 2022-05-14: qty 0.4

## 2022-05-14 MED ORDER — IOHEXOL 300 MG/ML  SOLN
100.0000 mL | Freq: Once | INTRAMUSCULAR | Status: AC | PRN
  Administered 2022-05-14: 100 mL via INTRAVENOUS

## 2022-05-14 NOTE — Consult Note (Signed)
Pharmacy Antibiotic Note  Trevor Brown is a 31 y.o. male with PMH including T1DM, depression, hepatitis C, hidradenitis suppurativa, polysubstance abuse / IVDU admitted on 05/14/2022 with  left upper arm infection / abscesses .  Pharmacy has been consulted for vancomycin and cefepime dosing.  Plan:  Cefepime 2 g IV q8h  Vancomycin 1 g IV LD followed by vancomycin 500 mg IV q12h --Calculated AUC: 487, Cmin 14.2 --Daily Scr per protocol --Levels at steady state or as clinically indicated  Weight: 46.8 kg (103 lb 2.8 oz)  Temp (24hrs), Avg:99.1 F (37.3 C), Min:98.7 F (37.1 C), Max:99.6 F (37.6 C)  Recent Labs  Lab 05/14/22 1610  WBC 14.5*  CREATININE 1.06  LATICACIDVEN 4.6*    Estimated Creatinine Clearance: 68.1 mL/min (by C-G formula based on SCr of 1.06 mg/dL).    Allergies  Allergen Reactions   Bee Venom Anaphylaxis   Ibuprofen Itching, Rash and Other (See Comments)    Other reaction(s): Other (See Comments) Stomach upset Reaction:  GI upset    Tramadol Hives and Swelling   Vancomycin Rash and Other (See Comments)    Reaction:  Red man's syndrome     Antimicrobials this admission: Vancomycin 10/24 >>  Cefepime 10/24 >>   Dose adjustments this admission: N/A  Microbiology results: 10/24 BCx: pending 10/24 Wcx: pending  Thank you for allowing pharmacy to be a part of this patient's care.  Benita Gutter 05/14/2022 9:15 PM

## 2022-05-14 NOTE — ED Triage Notes (Signed)
First Nurse Note; Pt via EMS from home. Pt has an abscess in the L bicep, states they are recurrent. States that he has type II DM. Pt is A&OX4 and NAD VSS

## 2022-05-14 NOTE — Progress Notes (Signed)
Patient with left upper arm wound.  Right arm assessed with ultrasound with only the compressible vein at the [facility] therefore IV started there at this time.  If patient requires more access or current IV is no longer working, please consider another type of venous access.

## 2022-05-14 NOTE — H&P (Signed)
History and Physical    Patient: Trevor Brown DOB: 1990/08/04 DOA: 05/14/2022 DOS: the patient was seen and examined on 05/14/2022 PCP: Pcp, No  Patient coming from: Home  Chief Complaint:  Chief Complaint  Patient presents with   arm infection   HPI: Trevor Brown is a 31 y.o. male with medical history significant of type 1 diabetes, recurrent DKA, acute otitis C, heroin abuse, essential hypertension who presented to the ER with left upper extremity swelling, pain, and concern for infection of the upper arm.  Apparently this been going on over the last 2 weeks where he has a developing abscess.  He has had previous abscess of the left arm which was treated.  No fever or chills.  No trauma.  He previously has had an MRSA infection.  In the ER he was seen and evaluated he has significant leukocytosis and tachycardia.  Left arm is swollen tenderness.  Initial evaluation showed an abscess.  Orthopedics consulted and patient being admitted to the hospital for further evaluation and treatment. Review of Systems: As mentioned in the history of present illness. All other systems reviewed and are negative. Past Medical History:  Diagnosis Date   Heart attack ([place])    Pt claims he had heart attack a year ago ( 2015)- and was admitted in [facility] for that, but not given any meds or angiogram, on review of chart- I could not find any details like that.   Hepatitis C, acute may 2016   Heroin abuse ([place])    History of noncompliance with medical treatment    Hypertension    Type 1 diabetes [facility])    Past Surgical History:  Procedure Laterality Date   INCISION AND DRAINAGE ABSCESS Left 03/04/2020   Procedure: INCISION AND DRAINAGE ABSCESS;  Surgeon: Corky Mull, MD;  Location: [facility] ORS;  Service: Orthopedics;  Laterality: Left;   NO PAST SURGERIES     none     Social History:  reports that he has been smoking cigarettes. He has been smoking an average of 2 packs per day. He has never  used smokeless tobacco. He reports that he does not currently use drugs after having used the following drugs: IV, Heroin, and Marijuana. He reports that he does not drink alcohol.  Allergies  Allergen Reactions   Bee Venom Anaphylaxis   Ibuprofen Itching, Rash and Other (See Comments)    Other reaction(s): Other (See Comments) Stomach upset Reaction:  GI upset    Tramadol Hives and Swelling   Vancomycin Rash and Other (See Comments)    Reaction:  Red man's syndrome     Family History  Problem Relation Age of Onset   Cirrhosis Mother    Diabetes Mellitus II Maternal Grandmother    Diabetes Mellitus II Maternal Grandfather    CAD Father     Prior to Admission medications   Medication Sig Start Date End Date Taking? Authorizing Provider  blood glucose meter kit and supplies KIT USE AS DIRECTED UP TO 4 TIMES DAILY. 04/18/21   Harvest Dark, MD  ferrous sulfate 325 (65 FE) MG tablet Take 1 tablet (325 mg total) by mouth 2 (two) times daily. 11/30/21 02/28/22  Val Riles, MD  gabapentin (NEURONTIN) 400 MG capsule Take 1 capsule (400 mg total) by mouth 2 (two) times daily. 11/30/21 12/30/21  Val Riles, MD  glucose blood (FREESTYLE LITE) test strip To be used as directed for checking her blood sugar 01/08/21   Sidney Ace, MD  glucose blood (RIGHTEST GS550 BLOOD GLUCOSE) test strip USE AS DIRECTED UP TO 4 TIMES DAILY. 04/19/21   Cammy Copa, RPH  Insulin Glargine (BASAGLAR KWIKPEN) 100 UNIT/ML Inject 30 Units into the skin 2 (two) times daily. 11/30/21   Val Riles, MD  insulin lispro (HUMALOG KWIKPEN) 100 UNIT/ML KwikPen Inject 5-10 Units into the skin 3 (three) times daily. 11/30/21   Val Riles, MD  Insulin Pen Needle 32G X 4 MM MISC USE AS DIRECTED WITH INSULIN PENS. 01/08/21   Cammy Copa, RPH  Insulin Pen Needle 32G X 4 MM MISC Use as directed with insulin pens. 11/30/21   Cammy Copa, RPH  pantoprazole (PROTONIX) 40 MG tablet Take 1 tablet (40 mg total)  by mouth once daily. 11/30/21 02/28/22  Val Riles, MD  Rightest GL300 Lancets MISC USE AS DIRECTED UP TO 4 TIMES DAILY. 04/19/21   Cammy Copa, [facility]    Physical Exam: Vitals:   05/14/22 1804 05/14/22 1815 05/14/22 1830 05/14/22 1924  BP:  (!) 129/90 128/86 (!) 133/92  Pulse: 96 95 93 97  Resp: 20 14 20 20   Temp:   99 F (37.2 C) 98.9 F (37.2 C)  TempSrc:   Oral Oral  SpO2: 100% 100% 100% 100%   Constitutional: Chronically ill looking NAD, calm, comfortable Eyes: PERRL, lids and conjunctivae normal ENMT: Mucous membranes are moist. Posterior pharynx clear of any exudate or lesions.Normal dentition.  Neck: normal, supple, no masses, no thyromegaly Respiratory: clear to auscultation bilaterally, no wheezing, no crackles. Normal respiratory effort. No accessory muscle use.  Cardiovascular: Regular rate and rhythm, no murmurs / rubs / gallops. No extremity edema. 2+ pedal pulses. No carotid bruits.  Abdomen: no tenderness, no masses palpated. No hepatosplenomegaly. Bowel sounds positive.  Musculoskeletal: Left upper extremity is swollen, tender, fluctuant, open so with some purulent material Skin: Swelling, erythema, tender left upper extremity no rashes, lesions, ulcers. No induration Neurologic: CN 2-12 grossly intact. Sensation intact, DTR normal. Strength 5/5 in all 4.  Psychiatric: Normal judgment and insight. Alert and oriented x 3.  Depressed mood  Data Reviewed:  Temp is 99.6, blood pressure 130/100, pulse 115, white count 14.5, hemoglobin 12.1 and platelets 279.  Sodium 126, glucose 611.  Lactic acid 1.1.  CT of the left humerus/peripherally enhancing abscess on the medial aspect of the mid arm measuring 3.3 x 5.8 x 9.7 cm.  This involve the subcutaneous soft tissue.  Assessment and Plan:  #1 cellulitis and abscess of the left upper extremity: Patient will be admitted.  Orthopedic surgery consulted.  MRI ordered for the left upper extremity.  More than likely patient will  need I&D in the morning.  Initiate vancomycin and cefepime.  Previous history of MRSA.  #2 type 1 diabetes: On insulin.  Gap is high but no evidence of DKA.  Continue sliding scale insulin and home regimen  #3 polysubstance abuse: Counseling to be provided.  Monitor closely  #4 history of hepatitis C: Number supportive care.  #5 history of homelessness: Education officer, museum consult    Advance Care Planning:   Code Status: Prior full code  Consults: Orthopedic surgery, Dr. Mack Guise  Family Communication: No family at bedside  Severity of Illness: The appropriate patient status for this patient is INPATIENT. Inpatient status is judged to be reasonable and necessary in order to provide the required intensity of service to ensure the patient's safety. The patient's presenting symptoms, physical exam findings, and initial radiographic and laboratory data in the context  of their chronic comorbidities is felt to place them at high risk for further clinical deterioration. Furthermore, it is not anticipated that the patient will be medically stable for discharge from the hospital within 2 midnights of admission.   * I certify that at the point of admission it is my clinical judgment that the patient will require inpatient hospital care spanning beyond 2 midnights from the point of admission due to high intensity of service, high risk for further deterioration and high frequency of surveillance required.*  AuthorBarbette Merino, MD 05/14/2022 8:11 PM  For on call review [email or web address].

## 2022-05-14 NOTE — ED Notes (Signed)
Lab called at this time to collect lab work.

## 2022-05-14 NOTE — ED Triage Notes (Addendum)
Pt comes via EMs with c/o left arm abscess. Pt states he noticed it few days ago. Pt appears pale nad states some nausea. Bandage in place over site.

## 2022-05-14 NOTE — ED Notes (Signed)
Multiple unsuccessful attempts made for blood. IV team consulted.

## 2022-05-14 NOTE — ED Notes (Signed)
Unable to collect any samples at this time.

## 2022-05-14 NOTE — ED Provider Notes (Signed)
[facility] Provider Note  Patient Contact: 3:17 PM (approximate)   History   arm infection   HPI  Trevor Brown is a 31 y.o. male who presents the emergency department concern for infection to the left upper arm.  Patient states that over the last 2 weeks he has had a developing abscess.  Patient states that now he has 2 abscesses, one is a "hole" the other is a large abscess around the biceps region.  No fevers or chills are reported.  Patient denies any wounds or trauma to the area, denies current infection.  However on patient's medical record, he has a history of infections and MRSA.  Patient did arrive tachycardic but did not have a fever.  He denies any headache, visual changes, chest pain, shortness of breath.  No medications for this complaint prior to arrival     Physical Exam   Triage Vital Signs: ED Triage Vitals  Enc Vitals Group     BP 05/14/22 1431 (!) 138/100     Pulse Rate 05/14/22 1431 (!) 115     Resp 05/14/22 1431 18     Temp 05/14/22 1431 98.7 F (37.1 C)     Temp src --      SpO2 05/14/22 1431 100 %     Weight --      Height --      Head Circumference --      Peak Flow --      Pain Score 05/14/22 1429 10     Pain Loc --      Pain Edu? --      Excl. in GC? --     Most recent vital signs: Vitals:   05/14/22 1830 05/14/22 1924  BP: 128/86 (!) 133/92  Pulse: 93 97  Resp: 20 20  Temp: 99 F (37.2 C) 98.9 F (37.2 C)  SpO2: 100% 100%     General: Alert and in no acute distress.  Cardiovascular:  Good peripheral perfusion Respiratory: Normal respiratory effort without tachypnea or retractions. Lungs CTAB.  Musculoskeletal: Full range of motion to all extremities.  Visualization of the left upper extremity reveals an eroded area of tissue consistent with a previous abscess that is drained.  This is along the medial aspect of the arm just proximal to the elbow joint.  There is no gross edema or erythema of the joint.   Patient is able to flex and extend the elbow at this time.  Significant soft tissue edema with overlying erythema is noted to the biceps region.  This is so tight with fluctuance undressing the wound causes multiple areas to start leaking purulent material.  Patient left humeral region is roughly twice to 3 times the size of the right upper extremity.  Pulses sensation intact distally. Neurologic:  No gross focal neurologic deficits are appreciated.  Skin:   No rash noted Other:            ED Results / Procedures / Treatments   Labs (all labs ordered are listed, but only abnormal results are displayed) Labs Reviewed  BASIC METABOLIC PANEL - Abnormal; Notable for the following components:      Result Value   Sodium 126 (*)    Chloride 79 (*)    Glucose, Bld 611 (*)    Anion gap 16 (*)    All other components within normal limits  LACTIC ACID, PLASMA - Abnormal; Notable for the following components:   Lactic Acid, Venous 4.6 (*)  All other components within normal limits  URINALYSIS, ROUTINE W REFLEX MICROSCOPIC - Abnormal; Notable for the following components:   Color, Urine STRAW (*)    APPearance CLEAR (*)    Glucose, UA >=500 (*)    All other components within normal limits  URINE DRUG SCREEN, QUALITATIVE ([facility] ONLY) - Abnormal; Notable for the following components:   Opiate, Ur Screen POSITIVE (*)    Cannabinoid 50 Ng, Ur [place] POSITIVE (*)    All other components within normal limits  CBC WITH DIFFERENTIAL/PLATELET - Abnormal; Notable for the following components:   WBC 14.5 (*)    RBC 4.19 (*)    Hemoglobin 12.1 (*)    HCT 36.7 (*)    Neutro Abs 12.4 (*)    All other components within normal limits  BLOOD GAS, VENOUS - Abnormal; Notable for the following components:   pH, Ven 7.47 (*)    pCO2, Ven 61 (*)    pO2, Ven <31 (*)    Bicarbonate 44.4 (*)    Acid-Base Excess 17.4 (*)    All other components within normal limits  CBG MONITORING, ED - Abnormal; Notable  for the following components:   Glucose-Capillary 493 (*)    All other components within normal limits  CBG MONITORING, ED - Abnormal; Notable for the following components:   Glucose-Capillary 232 (*)    All other components within normal limits  CULTURE, BLOOD (ROUTINE X 2)  CULTURE, BLOOD (ROUTINE X 2)  AEROBIC CULTURE W GRAM STAIN (SUPERFICIAL SPECIMEN)  BETA-HYDROXYBUTYRIC ACID  OSMOLALITY  LACTIC ACID, PLASMA     EKG     RADIOLOGY  I personally viewed, evaluated, and interpreted these images as part of my medical decision making, as well as reviewing the written report by the radiologist.  ED Provider Interpretation: CT scan reveals large abscess between the biceps and triceps distribution.  On CT it does not appear that there is muscular involvement cannot rule it out, MRI is recommended.  I discussed the results with on-call orthopedic surgeon, Dr. Martha Clan who also advises that MRI is imperative for further evaluation.  CT HUMERUS LEFT W CONTRAST  Result Date: 05/14/2022 CLINICAL DATA:  Soft tissue mass/abscess. EXAM: CT OF THE UPPER LEFT EXTREMITY WITH CONTRAST TECHNIQUE: Multidetector CT imaging of the upper left extremity was performed according to the standard protocol following intravenous contrast administration. RADIATION DOSE REDUCTION: This exam was performed according to the departmental dose-optimization program which includes automated exposure control, adjustment of the mA and/or kV according to patient size and/or use of iterative reconstruction technique. CONTRAST:  OMNIPAQUE IOHEXOL 300 MG/ML  SOLN COMPARISON:  None Available. FINDINGS: Bones/Joint/Cartilage No acute osseous abnormality. No appreciable joint effusion. No cortical erosion or periosteal reaction. Ligaments Suboptimally assessed by CT. Muscles and Tendons There is no definite intramuscular collection or abscess, large subcutaneous medial abscess, likely a abuts the muscles and does not extend  into the muscle bellies, evaluation is however somewhat limited. Soft tissues There is a peripherally enhancing collection on the medial aspect of the mid arm measuring at least 3.3 x 5.8 x 9.7 cm. This process involves the subcutaneous soft tissues and abuts the biceps muscle and medial part of the triceps muscle. IMPRESSION: 1. Peripherally enhancing abscess on the medial aspect of the mid arm measuring at least 3.3 x 5.8 x 9.7 cm. This process involves the subcutaneous soft tissues and abuts the biceps muscle and medial part of the triceps muscle. This process does not appear to extend  into the muscles, clinical correlation and further evaluation with MR examination for intramuscular extension is suggested. 2. No acute osseous abnormality. Electronically Signed   By: Larose Hires D.O.   On: 05/14/2022 17:58    PROCEDURES:  Critical Care performed: Yes, see critical care procedure note(s)  .Critical Care E&M  Performed by: Racheal Patches, PA-C Critical care provider statement:    Critical care time (minutes):  65   Critical care time was exclusive of:  Separately billable procedures and treating other patients and teaching time   Critical care was necessary to treat or prevent imminent or life-threatening deterioration of the following conditions:  Sepsis   Critical care was time spent personally by me on the following activities:  Obtaining history from patient or surrogate, examination of patient, evaluation of patient's response to treatment, discussions with consultants, development of treatment plan with patient or surrogate, ordering and performing treatments and interventions, ordering and review of laboratory studies, ordering and review of radiographic studies, re-evaluation of patient's condition and review of old charts   I assumed direction of critical care for this patient from another provider in my specialty: no     Care discussed with: admitting provider   After initial E/M  assessment, critical care services were subsequently performed that were exclusive of separately billable procedures or treatment.      MEDICATIONS ORDERED IN ED: Medications  morphine (PF) 4 MG/ML injection 4 mg (4 mg Intravenous Given 05/14/22 1655)  ondansetron (ZOFRAN) injection 4 mg (4 mg Intravenous Given 05/14/22 1655)  diphenhydrAMINE (BENADRYL) injection 50 mg (50 mg Intravenous Given 05/14/22 1655)  vancomycin (VANCOCIN) IVPB 1000 mg/200 mL premix (0 mg Intravenous Stopped 05/14/22 1921)  sodium chloride 0.9 % bolus 2,000 mL (2,000 mLs Intravenous New Bag/Given 05/14/22 1803)  insulin aspart (novoLOG) injection 15 Units (15 Units Intravenous Given 05/14/22 1741)  iohexol (OMNIPAQUE) 300 MG/ML solution 100 mL (100 mLs Intravenous Contrast Given 05/14/22 1723)  morphine (PF) 4 MG/ML injection 4 mg (4 mg Intravenous Given 05/14/22 1806)  morphine (PF) 4 MG/ML injection 4 mg (4 mg Intravenous Given 05/14/22 1950)     IMPRESSION / MDM / ASSESSMENT AND PLAN / ED COURSE  I reviewed the triage vital signs and the nursing notes.                              Differential diagnosis includes, but is not limited to, abscess, cellulitis, osteomyelitis, sepsis, hyperglycemia, DKA  Patient's presentation is most consistent with acute presentation with potential threat to life or bodily function.   Patient's diagnosis is consistent with abscess of the left arm, sepsis, hyperglycemia.  Patient presented to the emergency department with elevated heart rate, respiratory rate with a concern for abscess to the left humerus.  Patient had significant edema and erythema of the left humeral region.  There appears to be a large area of fluctuance encompassing most of the humeral area.  This is marked with indelible marker.  Patient also has an open soft tissue area with a previously draining abscess that is no longer expressing any purulence.  Examination of the shoulder joint and elbow did not reveal any  gross edema, tenderness and maintain good range of motion.  After dressing had been removed, patient did have a small amount of drainage from the abscess in the left humeral region.  There is no true open wounds and this does not continue to drain ongoing.  Patient  is lab, imaging performed.  Labs were concerning for elevated white blood cell count, significantly elevated lactate, hyperglycemia, elevated anion gap.  I was suspicious that the patient's glucose and anion gap was secondary to infection and not DKA but I did order beta hydroxybutyrate acid as well as the osmolality.  Both of these labs are reassuring and patient had significant improvement of his glucose after fluids and insulin.  Suspect that sugar is hyperglycemia not DKA at this time.  Patient was given 2 L of fluids, vancomycin.  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  Patient tolerated the vancomycin well with no evidence of "red man" syndrome.  I was initially going to attempt to collect a specimen of the purulence but patient did not have any ongoing purulence and I have not collected a superficial specimen.  Blood cultures have been obtained.  CT scan revealed large abscess around both the biceps and triceps though there is not appear to be a significant extension into the muscle itself.  However MRI was recommended.  I discussed the patient with on-call orthopedic surgeon, Dr. Mack Guise who recommends an emergent MRI to further evaluate the area in preparation for likely surgical incision and drainage of abscess.  MRI has been ordered, this is still pending at this time but I will admit to the hospitalist team..  Patient care will be transferred to the hospitalist service at this time.    Clinical Course as of 05/14/22 2002  Tue May 14, 2022  1529 Talked  with pharmacist about abx therapy for patient. Pharmacy recommends administering diphenhydramine prior to infusion and double the administration time [JC]    Clinical Course User Index [JC] Pebble Botkin, Charline Bills, PA-C     FINAL CLINICAL IMPRESSION(S) / ED DIAGNOSES   Final diagnoses:  Abscess of left arm  Sepsis without acute organ dysfunction, due to unspecified organism ([place])  Hyperglycemia     Rx / DC Orders   ED Discharge Orders     None        Note:  This document was prepared using Dragon voice recognition software and may include unintentional dictation errors.   Brynda Peon 05/14/22 2002    Naaman Plummer, MD 05/14/22 [phone]

## 2022-05-14 NOTE — ED Notes (Signed)
RT notified VBG sent to lab  

## 2022-05-15 ENCOUNTER — Inpatient Hospital Stay: Payer: Self-pay | Admitting: General Practice

## 2022-05-15 ENCOUNTER — Inpatient Hospital Stay (HOSPITAL_COMMUNITY)
Admit: 2022-05-15 | Discharge: 2022-05-15 | Disposition: A | Discharge status: Home / Self Care | Payer: Self-pay | Attending: Internal Medicine | Admitting: Internal Medicine

## 2022-05-15 ENCOUNTER — Encounter
Admission: EM | Disposition: A | Discharge status: Home / Self Care | Payer: Self-pay | Source: Home / Self Care | Attending: Internal Medicine

## 2022-05-15 ENCOUNTER — Encounter: Payer: Self-pay | Admitting: Internal Medicine

## 2022-05-15 DIAGNOSIS — R030 Elevated blood-pressure reading, without diagnosis of hypertension: Secondary | ICD-10-CM

## 2022-05-15 DIAGNOSIS — E440 Moderate protein-calorie malnutrition: Secondary | ICD-10-CM

## 2022-05-15 DIAGNOSIS — I5031 Acute diastolic (congestive) heart failure: Secondary | ICD-10-CM

## 2022-05-15 DIAGNOSIS — F1910 Other psychoactive substance abuse, uncomplicated: Secondary | ICD-10-CM

## 2022-05-15 HISTORY — PX: INCISION AND DRAINAGE: SHX5863

## 2022-05-15 LAB — GLUCOSE, CAPILLARY
Glucose-Capillary: 191 mg/dL — ABNORMAL HIGH (ref 70–99)
Glucose-Capillary: 298 mg/dL — ABNORMAL HIGH (ref 70–99)
Glucose-Capillary: 235 mg/dL — ABNORMAL HIGH (ref 70–99)
Glucose-Capillary: 94 mg/dL (ref 70–99)
Glucose-Capillary: 184 mg/dL — ABNORMAL HIGH (ref 70–99)
Glucose-Capillary: 523 mg/dL (ref 70–99)
Glucose-Capillary: 552 mg/dL (ref 70–99)

## 2022-05-15 LAB — CBC
HCT: 32.4 % — ABNORMAL LOW (ref 39.0–52.0)
Hemoglobin: 11.2 g/dL — ABNORMAL LOW (ref 13.0–17.0)
MCH: 30.2 pg (ref 26.0–34.0)
MCHC: 34.6 g/dL (ref 30.0–36.0)
MCV: 87.3 fL (ref 80.0–100.0)
Platelets: 265 10*3/uL (ref 150–400)
RBC: 3.71 MIL/uL — ABNORMAL LOW (ref 4.22–5.81)
RDW: 12.5 % (ref 11.5–15.5)
WBC: 14.6 10*3/uL — ABNORMAL HIGH (ref 4.0–10.5)
nRBC: 0 % (ref 0.0–0.2)

## 2022-05-15 LAB — COMPREHENSIVE METABOLIC PANEL
ALT: 38 U/L (ref 0–44)
AST: 27 U/L (ref 15–41)
Albumin: 3.1 g/dL — ABNORMAL LOW (ref 3.5–5.0)
Alkaline Phosphatase: 99 U/L (ref 38–126)
Anion gap: 10 (ref 5–15)
BUN: 19 mg/dL (ref 6–20)
CO2: 34 mmol/L — ABNORMAL HIGH (ref 22–32)
Calcium: 8.5 mg/dL — ABNORMAL LOW (ref 8.9–10.3)
Chloride: 86 mmol/L — ABNORMAL LOW (ref 98–111)
Creatinine, Ser: 0.9 mg/dL (ref 0.61–1.24)
GFR, Estimated: >60 mL/min (ref >60)
Glucose, Bld: 207 mg/dL — ABNORMAL HIGH (ref 70–99)
Potassium: 3.4 mmol/L — ABNORMAL LOW (ref 3.5–5.1)
Sodium: 130 mmol/L — ABNORMAL LOW (ref 135–145)
Total Bilirubin: 0.9 mg/dL (ref 0.3–1.2)
Total Protein: 6.6 g/dL (ref 6.5–8.1)

## 2022-05-15 LAB — BRAIN NATRIURETIC PEPTIDE: B Natriuretic Peptide: 41 pg/mL (ref 0.0–100.0)

## 2022-05-15 LAB — HEMOGLOBIN A1C
Hgb A1c MFr Bld: 11.1 % — ABNORMAL HIGH (ref 4.8–5.6)
Mean Plasma Glucose: 271.87 mg/dL

## 2022-05-15 LAB — HIV ANTIBODY (ROUTINE TESTING W REFLEX): HIV Screen 4th Generation wRfx: NONREACTIVE

## 2022-05-15 LAB — GLUCOSE, RANDOM: Glucose, Bld: 197 mg/dL — ABNORMAL HIGH (ref 70–99)

## 2022-05-15 SURGERY — INCISION AND DRAINAGE
Anesthesia: General | Laterality: Left

## 2022-05-15 MED ORDER — PROPOFOL 10 MG/ML IV BOLUS
INTRAVENOUS | Status: DC | PRN
  Administered 2022-05-15: 120 mg via INTRAVENOUS

## 2022-05-15 MED ORDER — KETAMINE HCL 10 MG/ML IJ SOLN
INTRAMUSCULAR | Status: DC | PRN
  Administered 2022-05-15: 30 mg via INTRAVENOUS
  Administered 2022-05-15: 20 mg via INTRAVENOUS

## 2022-05-15 MED ORDER — LABETALOL HCL 5 MG/ML IV SOLN
10.0000 mg | Freq: Once | INTRAVENOUS | Status: AC
  Administered 2022-05-15: 10 mg via INTRAVENOUS

## 2022-05-15 MED ORDER — HYDROMORPHONE HCL 1 MG/ML IJ SOLN
INTRAMUSCULAR | Status: AC
  Filled 2022-05-15: qty 1

## 2022-05-15 MED ORDER — PHENYLEPHRINE 80 MCG/ML (10ML) SYRINGE FOR IV PUSH (FOR BLOOD PRESSURE SUPPORT)
PREFILLED_SYRINGE | INTRAVENOUS | Status: AC
  Filled 2022-05-15: qty 10

## 2022-05-15 MED ORDER — MIDAZOLAM HCL 2 MG/2ML IJ SOLN
INTRAMUSCULAR | Status: DC | PRN
  Administered 2022-05-15: 2 mg via INTRAVENOUS

## 2022-05-15 MED ORDER — SODIUM CHLORIDE 0.9 % IR SOLN
Status: DC | PRN
  Administered 2022-05-15: 9036 mL

## 2022-05-15 MED ORDER — KETAMINE HCL 50 MG/5ML IJ SOSY
PREFILLED_SYRINGE | INTRAMUSCULAR | Status: AC
  Filled 2022-05-15: qty 5

## 2022-05-15 MED ORDER — NEOMYCIN-POLYMYXIN B GU 40-200000 IR SOLN
Status: AC
  Filled 2022-05-15: qty 20

## 2022-05-15 MED ORDER — FENTANYL CITRATE (PF) 100 MCG/2ML IJ SOLN
INTRAMUSCULAR | Status: DC | PRN
  Administered 2022-05-15 (×2): 50 ug via INTRAVENOUS

## 2022-05-15 MED ORDER — LABETALOL HCL 5 MG/ML IV SOLN
INTRAVENOUS | Status: AC
  Filled 2022-05-15: qty 4

## 2022-05-15 MED ORDER — ONDANSETRON HCL 4 MG/2ML IJ SOLN
INTRAMUSCULAR | Status: AC
  Filled 2022-05-15: qty 2

## 2022-05-15 MED ORDER — PROPOFOL 10 MG/ML IV BOLUS
INTRAVENOUS | Status: AC
  Filled 2022-05-15: qty 20

## 2022-05-15 MED ORDER — ACETAMINOPHEN 10 MG/ML IV SOLN
INTRAVENOUS | Status: AC
  Filled 2022-05-15: qty 100

## 2022-05-15 MED ORDER — OXYCODONE HCL 5 MG/5ML PO SOLN: 5.0000 mg | Freq: Once | ORAL | Status: AC | PRN

## 2022-05-15 MED ORDER — NALOXONE HCL 2 MG/2ML IJ SOSY: 0.4000 mg | PREFILLED_SYRINGE | INTRAMUSCULAR | Status: DC | PRN

## 2022-05-15 MED ORDER — MIDAZOLAM HCL 2 MG/2ML IJ SOLN
INTRAMUSCULAR | Status: AC
  Filled 2022-05-15: qty 2

## 2022-05-15 MED ORDER — ACETAMINOPHEN 325 MG PO TABS
650.0000 mg | ORAL_TABLET | Freq: Four times a day (QID) | ORAL | Status: DC | PRN
  Administered 2022-05-15 – 2022-05-19 (×4): 650 mg via ORAL
  Filled 2022-05-15 (×4): qty 2

## 2022-05-15 MED ORDER — FENTANYL CITRATE (PF) 100 MCG/2ML IJ SOLN
INTRAMUSCULAR | Status: AC
  Administered 2022-05-15: 50 ug via INTRAVENOUS
  Filled 2022-05-15: qty 2

## 2022-05-15 MED ORDER — LABETALOL HCL 5 MG/ML IV SOLN
10.0000 mg | INTRAVENOUS | Status: DC | PRN
  Administered 2022-05-15: 10 mg via INTRAVENOUS

## 2022-05-15 MED ORDER — INSULIN GLARGINE-YFGN 100 UNIT/ML ~~LOC~~ SOLN
15.0000 [IU] | Freq: Two times a day (BID) | SUBCUTANEOUS | Status: DC
  Administered 2022-05-15 – 2022-05-18 (×7): 15 [IU] via SUBCUTANEOUS
  Filled 2022-05-15 (×8): qty 0.15

## 2022-05-15 MED ORDER — OXYCODONE HCL 5 MG PO TABS
5.0000 mg | ORAL_TABLET | ORAL | Status: DC | PRN
  Administered 2022-05-15 – 2022-05-18 (×15): 10 mg via ORAL
  Administered 2022-05-19: 5 mg via ORAL
  Administered 2022-05-19 – 2022-05-20 (×5): 10 mg via ORAL
  Administered 2022-05-20: 5 mg via ORAL
  Administered 2022-05-20 (×2): 10 mg via ORAL
  Filled 2022-05-15 (×12): qty 2
  Filled 2022-05-15: qty 1
  Filled 2022-05-15 (×8): qty 2
  Filled 2022-05-15: qty 1
  Filled 2022-05-15 (×2): qty 2

## 2022-05-15 MED ORDER — HYDROMORPHONE HCL 1 MG/ML IJ SOLN
0.5000 mg | INTRAMUSCULAR | Status: DC | PRN
  Administered 2022-05-15 – 2022-05-19 (×19): 1 mg via INTRAVENOUS
  Filled 2022-05-15 (×19): qty 1

## 2022-05-15 MED ORDER — FENTANYL CITRATE (PF) 100 MCG/2ML IJ SOLN: 50.0000 ug | Freq: Once | INTRAMUSCULAR | Status: AC

## 2022-05-15 MED ORDER — HYDRALAZINE HCL 10 MG PO TABS: 10.0000 mg | ORAL_TABLET | Freq: Four times a day (QID) | ORAL | Status: DC | PRN

## 2022-05-15 MED ORDER — PHENYLEPHRINE HCL (PRESSORS) 10 MG/ML IV SOLN
INTRAVENOUS | Status: DC | PRN
  Administered 2022-05-15 (×2): 80 ug via INTRAVENOUS

## 2022-05-15 MED ORDER — LIDOCAINE HCL (CARDIAC) PF 100 MG/5ML IV SOSY
PREFILLED_SYRINGE | INTRAVENOUS | Status: DC | PRN
  Administered 2022-05-15: 60 mg via INTRAVENOUS

## 2022-05-15 MED ORDER — FENTANYL CITRATE (PF) 100 MCG/2ML IJ SOLN
INTRAMUSCULAR | Status: AC
  Filled 2022-05-15: qty 2

## 2022-05-15 MED ORDER — FENTANYL CITRATE (PF) 100 MCG/2ML IJ SOLN
25.0000 ug | INTRAMUSCULAR | Status: DC | PRN
  Administered 2022-05-15 (×3): 25 ug via INTRAVENOUS
  Administered 2022-05-15: 50 ug via INTRAVENOUS

## 2022-05-15 MED ORDER — NEOMYCIN-POLYMYXIN B GU 40-200000 IR SOLN
Status: AC
  Filled 2022-05-15: qty 40

## 2022-05-15 MED ORDER — ENOXAPARIN SODIUM 40 MG/0.4ML IJ SOSY
40.0000 mg | PREFILLED_SYRINGE | INTRAMUSCULAR | Status: DC
  Filled 2022-05-15 (×3): qty 0.4

## 2022-05-15 MED ORDER — OXYCODONE-ACETAMINOPHEN 5-325 MG PO TABS
1.0000 | ORAL_TABLET | Freq: Once | ORAL | Status: AC
  Administered 2022-05-15: 1 via ORAL
  Filled 2022-05-15: qty 1

## 2022-05-15 MED ORDER — OXYCODONE HCL 5 MG PO TABS
ORAL_TABLET | ORAL | Status: AC
  Filled 2022-05-15: qty 1

## 2022-05-15 MED ORDER — OXYCODONE HCL 5 MG PO TABS
5.0000 mg | ORAL_TABLET | Freq: Once | ORAL | Status: AC | PRN
  Administered 2022-05-15: 5 mg via ORAL

## 2022-05-15 MED ORDER — ACETAMINOPHEN 10 MG/ML IV SOLN
INTRAVENOUS | Status: DC | PRN
  Administered 2022-05-15: 750 mg via INTRAVENOUS

## 2022-05-15 MED ORDER — FENTANYL CITRATE (PF) 100 MCG/2ML IJ SOLN
INTRAMUSCULAR | Status: AC
  Administered 2022-05-15: 25 ug via INTRAVENOUS
  Filled 2022-05-15: qty 2

## 2022-05-15 MED ORDER — SUCCINYLCHOLINE CHLORIDE 200 MG/10ML IV SOSY
PREFILLED_SYRINGE | INTRAVENOUS | Status: DC | PRN
  Administered 2022-05-15: 100 mg via INTRAVENOUS

## 2022-05-15 MED ORDER — DEXMEDETOMIDINE HCL IN NACL 200 MCG/50ML IV SOLN
INTRAVENOUS | Status: DC | PRN
  Administered 2022-05-15 (×2): 8 ug via INTRAVENOUS

## 2022-05-15 SURGICAL SUPPLY — 33 items
BNDG COHESIVE 6X5 TAN ST LF (GAUZE/BANDAGES/DRESSINGS) ×1 IMPLANT
BNDG ELASTIC 4X5.8 VLCR STR LF (GAUZE/BANDAGES/DRESSINGS) IMPLANT
DRAIN PENROSE 12X.25 LTX STRL (MISCELLANEOUS) IMPLANT
DRAPE 3/4 80X56 (DRAPES) ×1 IMPLANT
DRAPE SURG 17X11 SM STRL (DRAPES) ×2 IMPLANT
DRAPE U-SHAPE 47X51 STRL (DRAPES) ×1 IMPLANT
ELECT CAUTERY BLADE 6.4 (BLADE) ×1 IMPLANT
ELECT REM PT RETURN 9FT ADLT (ELECTROSURGICAL) ×1
ELECTRODE REM PT RTRN 9FT ADLT (ELECTROSURGICAL) ×1 IMPLANT
GAUZE SPONGE 4X4 12PLY STRL (GAUZE/BANDAGES/DRESSINGS) ×1 IMPLANT
GLOVE BIOGEL PI IND STRL 9 (GLOVE) ×1 IMPLANT
GLOVE BIOGEL PI ORTHO SZ9 (GLOVE) ×6 IMPLANT
GOWN STRL REUS TWL 2XL XL LVL4 (GOWN DISPOSABLE) ×1 IMPLANT
GOWN STRL REUS W/ TWL LRG LVL3 (GOWN DISPOSABLE) ×1 IMPLANT
GOWN STRL REUS W/TWL LRG LVL3 (GOWN DISPOSABLE) ×1
HANDLE YANKAUER SUCT BULB TIP (MISCELLANEOUS) IMPLANT
KIT TURNOVER KIT A (KITS) ×1 IMPLANT
MANIFOLD NEPTUNE II (INSTRUMENTS) ×1 IMPLANT
NDL FILTER BLUNT 18X1 1/2 (NEEDLE) ×1 IMPLANT
NEEDLE FILTER BLUNT 18X1 1/2 (NEEDLE) ×1 IMPLANT
NS IRRIG 500ML POUR BTL (IV SOLUTION) ×1 IMPLANT
PACK EXTREMITY ARMC (MISCELLANEOUS) ×1 IMPLANT
PAD ABD DERMACEA PRESS 5X9 (GAUZE/BANDAGES/DRESSINGS) IMPLANT
PADDING CAST BLEND 4X4 STRL (MISCELLANEOUS) IMPLANT
PULSAVAC PLUS IRRIG FAN TIP (DISPOSABLE) ×1
SLING ARM LRG DEEP (SOFTGOODS) IMPLANT
SPONGE T-LAP 18X36 ~~LOC~~+RFID STR (SPONGE) IMPLANT
SUT ETHILON 3-0 FS-10 30 BLK (SUTURE) ×1
SUTURE EHLN 3-0 FS-10 30 BLK (SUTURE) IMPLANT
SYR 10ML LL (SYRINGE) ×1 IMPLANT
TIP FAN IRRIG PULSAVAC PLUS (DISPOSABLE) IMPLANT
TRAP FLUID SMOKE EVACUATOR (MISCELLANEOUS) ×1 IMPLANT
WATER STERILE IRR 500ML POUR (IV SOLUTION) ×1 IMPLANT

## 2022-05-15 NOTE — Assessment & Plan Note (Addendum)
BMI of 17.7.  Nutrition to see.

## 2022-05-15 NOTE — Progress Notes (Signed)
Met with the patient in the room He stated that he lives with friends He was [facility] and I explained to him how to get an appointment HE stated that he needs a Glucometer and test strips,  One was provided to him thru our charity He stated that he is not sure who will be picking him up at DC He is young and independent at baseline

## 2022-05-15 NOTE — Hospital Course (Addendum)
31 year old male with past medical history of homelessness, polysubstance abuse, hepatitis C, uncontrolled diabetes mellitus type 1 who presented to the emergency room on 10/24 with left upper extremity swelling and pain.  At time of admission, pt was in DKA.  Work-up revealed cellulitis and abscess of left forearm and patient admitted to the hospitalist service and started on IV antibiotics.  Also treated with IV fluids and insulin drip.  DKA resolved by evening of 10/24.  Orthopedic surgery consulted and took patient for I&D on afternoon of 10/25.

## 2022-05-15 NOTE — Assessment & Plan Note (Addendum)
Lantus started postop, and patient had dramatic response to sliding scale, likely indicative of noncompliance.  A1c 11.1 higher than in May.  Appreciate diabetes coordinator.  Decrease sliding scale to sensitive and added 3 times daily NovoLog.  CBGs improved.  Discharge patient on consistent Lantus 20 units twice daily, however patient did not pick up medication

## 2022-05-15 NOTE — Op Note (Signed)
05/15/2022  3:14 PM  PATIENT:  Trevor Brown    PRE-OPERATIVE DIAGNOSIS:  Left medial arm Abscess secondary to IV drug use  POST-OPERATIVE DIAGNOSIS:  Same  PROCEDURE: Open incision and drainage of medial left arm abscess  SURGEON:  Thornton Park, MD  ANESTHESIA:   General  PREOPERATIVE INDICATIONS:  [name] is a  31 y.o. male with a diagnosis of Left ileal arm Abscess IV drug use.  Patient presented overnight to the emergency room with increasing pain and swelling in the medial left arm.  Patient states symptoms began earlier this week and another result of IV drug use.  A CT scan and MRI show an abscess in the subcutaneous tissue of the left medial arm.  Patient was admitted for IV antibiotics overnight.  Patient requires an incision and drainage for treatment of his large abscess.  I discussed the risks and benefits of surgery. The risks include but are not limited to persistent, progressive or recurrent infection, bleeding , nerve or blood vessel injury, joint stiffness or loss of motion, persistent pain, weakness or instability, and the need for further surgery including repeat I&D or possible amputation. Medical risks include but are not limited to DVT and pulmonary embolism, myocardial infarction, stroke, pneumonia, respiratory failure and death. Patient understood these risks and wished to proceed.   OPERATIVE PROCEDURE: Patient was met in the preoperative area.  The left arm was marked according to hospital's correct site of surgery protocol.  Patient was brought to the operating room where he was placed supine on the operative table.  He underwent general endotracheal intubation after the access was achieved.  Patient had his left arm placed on an arm table.  The abscess was too proximal to apply a tourniquet.  Patient was prepped and draped in a sterile fashion.  Had already received vancomycin and cefepime.  No further antibiotics were given.  Patient had a large area of  fluctuance over the anterior medial arm.  The patient had a chronic ulceration over his medial epicondyle doubt necrosis or drainage.  There appeared to be healthy granulation tissue in the bed of this wound.  There was a secondary open wound approximately 1 cm in diameter and approximately 6 cm proximal to the medial epicondyle, just posterior to the medial midline of the arm.  A third ulceration was over the anterior medial arm approximately 8 cm above the medial epicondyle approximately 2 cm anterior to the medial midline of the arm.  This had a fibropurulent covering and no active drainage.  Cultures were taken of the copious amounts of purulent drainage from the open wound just posterior to the midline of the arm.  This was sent for Gram stain and culture.  A 10 cm incision was made extending from the medial epicondyle letting the difference between the 2 proximal wounds.  The skin was incised.  All bleeding vessels were cauterized.  The patient was given Lovenox per the hospitalist on admission and the patient had significant oozing.  The subcutaneous tissue was then bluntly dissected with a Metzenbaum scissor and forceps.  The abscess was between the subcutaneous tissue and deep fascia.  This plane was already established from the abscess.  Digital dissection was used to open the communicating channels to the 3 wounds in the medial arm.  Two additional swabs were taken of purulent fluid encountered during digital dissection.  Samples of thickened purulent material were also collected in a specimen cup to be sent to microbiology.  Care was taken to avoid injury to any neurovascular structures.  The deep fascia was not incised or penetrated and the abscess did not seem to extend below the deep fascial layer.  The wound was then copiously irrigated with 9 L of GU infused saline.  2 Penrose drains were placed in the wound one longitudinally in line with the incision and a second connecting the 2 proximal  wounds.  The incisions were closed with 3-0 nylon around the Penrose drain.  The surgical incision was closed with horizontal mattress sutures.  Simple sutures were placed in the 2 proximal wounds around the Penrose drain.  Dry sterile dressing was applied.  Patient was awoken and brought to the recovery room in stable condition.  The patient will remain in the hospital for postoperative IV antibiotics.

## 2022-05-15 NOTE — Assessment & Plan Note (Addendum)
No previous history of hypertension although echocardiogram in 2017 noted grade 1 diastolic dysfunction.  Echocardiogram unremarkable.  We will try low-dose hydralazine

## 2022-05-15 NOTE — Assessment & Plan Note (Signed)
Between this and drug use, makes patient extremely difficult to set up follow-up appointment and long-term wound care.

## 2022-05-15 NOTE — Assessment & Plan Note (Addendum)
Status post open I&D of medial left arm abscess.  Cultures noting gram-positive cocci in pairs and gram-negative rods, awaiting identification and sensitivities.  Currently on cefepime and vancomycin.  --changed to PO Augmentin since wound culture growing poly microbial organism. Discussed with pharmacy. -- Dr. Mack Guise will change dressing tomorrow. Okay to discharge tomorrow from his standpoint after dressing changes done. Patient remains afebrile.

## 2022-05-15 NOTE — Inpatient Diabetes Management (Addendum)
Inpatient Diabetes Program Recommendations  AACE/ADA: New Consensus Statement on Inpatient Glycemic Control   Target Ranges:  Prepandial:   less than 140 mg/dL      Peak postprandial:   less than 180 mg/dL (1-2 hours)      Critically ill patients:  140 - 180 mg/dL    Latest Reference Range & Units 05/14/22 18:08 05/14/22 19:38 05/14/22 22:39 05/15/22 04:16 05/15/22 09:20  Glucose-Capillary 70 - 99 mg/dL 493 (H) 232 (H) 214 (H) 191 (H) 298 (H)    Latest Reference Range & Units 05/14/22 16:10  CO2 22 - 32 mmol/L 31  Glucose 70 - 99 mg/dL 611 (HH)  Anion gap 5 - 15  16 (H)   Review of Glycemic Control  Diabetes history: DM1 (does NOT make any insulin; requires basal, correction, and carb coverage insulin) Outpatient Diabetes medications: Basaglar 30 units BID, Humalog 5-10 units TID with meals Current orders for Inpatient glycemic control: Novolog 0-20 units TID with meals, Novolog 0-5 units QHS  Inpatient Diabetes Program Recommendations:    Insulin: Please consider ordering Semglee 20 units BID and decreasing Novolog correction to 0-15 units TID with meals.  NOTE: Patient is well known to inpatient DM team due to frequent hospital admissions. Patient has Type 1 DM and no insurance. Patient had [facility] getting [facility] (as of Nov 28, 2021 when diabetes coordinator spoke with patient last). Note that TOC talked with patient today and provided application for [facility] and provided patient with glucometer and test strips.   Addendum 05/15/22@12 :58-Patient is currently in OR. Will plan to follow up with patient on Thursday 10/26.  Thanks, Barnie Alderman, RN, MSN, [place] Diabetes Coordinator Inpatient Diabetes Program [phone] (Team Pager from 8am to [place])

## 2022-05-15 NOTE — Assessment & Plan Note (Signed)
Urine drug screen positive for opiates, but not for cocaine as in the past patient notes currently IV heroin.  Regardless, monitor for withdrawals.

## 2022-05-15 NOTE — Anesthesia Procedure Notes (Signed)
Procedure Name: Intubation Date/Time: 05/15/2022 1:16 PM  Performed by: Debe Coder, CRNAPre-anesthesia Checklist: Patient identified, Emergency Drugs available, Suction available and Patient being monitored Patient Re-evaluated:Patient Re-evaluated prior to induction Oxygen Delivery Method: Circle system utilized Preoxygenation: Pre-oxygenation with 100% oxygen Induction Type: IV induction, Rapid sequence and Cricoid Pressure applied Laryngoscope Size: McGraph and 3 Grade View: Grade I Tube type: Oral Tube size: 7.0 mm Number of attempts: 1 Airway Equipment and Method: Stylet Placement Confirmation: ETT inserted through vocal cords under direct vision, positive ETCO2 and breath sounds checked- equal and bilateral Secured at: 21 cm Tube secured with: Tape Dental Injury: Teeth and Oropharynx as per pre-operative assessment

## 2022-05-15 NOTE — Assessment & Plan Note (Addendum)
Patient declines nicotine patch at this time

## 2022-05-15 NOTE — Transfer of Care (Signed)
Immediate Anesthesia Transfer of Care Note  Patient: Brendia Sacks  Procedure(s) Performed: INCISION AND DRAINAGE (Left)  Patient Location: PACU  Anesthesia Type:General  Level of Consciousness: awake, drowsy and patient cooperative  Airway & Oxygen Therapy: Patient Spontanous Breathing and Patient connected to face mask oxygen  Post-op Assessment: Report given to RN and Post -op Vital signs reviewed and stable  Post vital signs: Reviewed and stable  Last Vitals:  Vitals Value Taken Time  BP    Temp    Pulse 80 05/15/22 1509  Resp 26 05/15/22 1509  SpO2 100 % 05/15/22 1509  Vitals shown include unvalidated device data.  Last Pain:  Vitals:   05/15/22 1123  TempSrc: Temporal  PainSc: 8       Patients Stated Pain Goal: 0 (62/95/28 4132)  Complications: No notable events documented.

## 2022-05-15 NOTE — Anesthesia Preprocedure Evaluation (Signed)
Anesthesia Evaluation  Patient identified by MRN, date of birth, ID band Patient awake    Reviewed: Allergy & Precautions, H&P , NPO status , reviewed documented beta blocker date and time   History of Anesthesia Complications Negative for: history of anesthetic complications  Airway Mallampati: I  TM Distance: >3 FB Neck ROM: full    Dental  (+) Poor Dentition, Dental Advidsory Given No remaining intact teeth. Very poor dentition. Nothing loose.:   Pulmonary neg shortness of breath, neg COPD, Current Smoker and Patient abstained from smoking.,    Pulmonary exam normal        Cardiovascular hypertension, + Past MI  Normal cardiovascular exam  Hx pos troponin, neg cardiac w/u   Neuro/Psych PSYCHIATRIC DISORDERS Depression  Neuromuscular disease    GI/Hepatic neg GERD  ,(+) Hepatitis -, C  Endo/Other  diabetes  Renal/GU      Musculoskeletal   Abdominal   Peds  Hematology   Anesthesia Other Findings Patient had orange juice for breakfast. Plan on doing an RSI for patient.   Patient has history of drug use and used heroin and meth yesterday.   Past Medical History: No date: Heart attack [facility])     Comment:  Pt claims he had heart attack a year ago ( 2015)- and               was admitted in [facility] for that, but not given any meds or               angiogram, on review of chart- I could not find any               details like that. may 2016: Hepatitis C, acute No date: Heroin abuse ([place]) No date: History of noncompliance with medical treatment No date: Hypertension No date: Type 1 diabetes ([place])  Past Surgical History: I&D arm, multiple   BMI    Body Mass Index: 22.31 kg/m      Reproductive/Obstetrics                             Anesthesia Physical  Anesthesia Plan  ASA: 3 and emergent  Anesthesia Plan: General ETT   Post-op Pain Management:    Induction: Intravenous  PONV  Risk Score and Plan: 2 and Treatment may vary due to age or medical condition, TIVA, Ondansetron and Midazolam  Airway Management Planned: Oral ETT  Additional Equipment:   Intra-op Plan:   Post-operative Plan: Extubation in OR  Informed Consent: I have reviewed the patients History and Physical, chart, labs and discussed the procedure including the risks, benefits and alternatives for the proposed anesthesia with the patient or authorized representative who has indicated his/her understanding and acceptance.     Dental Advisory Given  Plan Discussed with: Anesthesiologist, CRNA and Surgeon  Anesthesia Plan Comments: (Patient consented for risks of anesthesia including but not limited to:  - adverse reactions to medications - damage to eyes, teeth, lips or other oral mucosa - nerve damage due to positioning  - sore throat or hoarseness - Damage to heart, brain, nerves, lungs, other parts of body or loss of life  Patient voiced understanding.)        Anesthesia Quick Evaluation

## 2022-05-15 NOTE — Progress Notes (Signed)
[facility] Progress Note  Patient: Trevor Brown    OZD:[id number]  DOA: 05/14/2022    Date of Service: the patient was seen and examined on 05/15/2022  Brief hospital course: 31 year old male with past medical history of homelessness, polysubstance abuse, hepatitis C, uncontrolled diabetes mellitus type 1 who presented to the emergency room on 10/24 with left upper extremity swelling and pain.  Work-up revealed cellulitis and abscess of left forearm and patient admitted to the hospitalist service and started on IV antibiotics.  Orthopedic surgery consulted and took patient for I&D on afternoon of 10/25  Assessment and Plan: Assessment and Plan: * Cellulitis of left upper arm Status post open I&D of medial left arm abscess.  Cultures pending.  On IV antibiotics pain medication for pain control given polysubstance abuse history.  Elevated blood pressure reading No previous history of hypertension although echocardiogram in 2017 noted grade 1 diastolic dysfunction.  We will repeat to ensure no volume overload.  Also monitor for if any of this is from withdrawals.  Diabetes mellitus type 1 ([place]) uncontrolled with hyperglycemia Lantus started postop, continue sliding scale.  A1c in May at 9.2, will recheck.  Hepatitis C Unclear as to severity, will check quantitative  Polysubstance abuse (HCC) Urine drug screen positive for opiates, but not for cocaine as in the past patient notes currently IV heroin.  Regardless, monitor for withdrawals.  Tobacco abuse We will discuss with patient about nicotine patch.  Homelessness Between this and drug use, makes patient extremely difficult to set up follow-up appointment and long-term wound care.  Malnutrition of moderate degree (HCC) BMI of 17.7.  Nutrition to see.       Body mass index is 17.71 kg/m.        Consultants: Orthopedic surgery  Procedures: Status post open I&D left forearm 10/25  Antimicrobials: IV cefepime and  vancomycin 10/24-present  Code Status: Full code   Subjective: Patient seen before surgery, complaining of left arm pain  Objective: Vital signs were reviewed and unremarkable. Vitals:   05/15/22 1600 05/15/22 1612  BP: (!) 147/107 (!) 132/98  Pulse: 69 69  Resp: 19 19  Temp:    SpO2: 100% 100%    Intake/Output Summary (Last 24 hours) at 05/15/2022 1709 Last data filed at 05/15/2022 1600 Gross per 24 hour  Intake 900 ml  Output 250 ml  Net 650 ml   Filed Weights   05/14/22 2051 05/15/22 1123  Weight: 46.8 kg 46.8 kg   Body mass index is 17.71 kg/m.  Exam:  General: Alert and oriented x3, no acute distress, looks slightly emaciated HEENT: Normocephalic, atraumatic, mucous membranes slightly dry Cardiovascular: Regular rate and rhythm, S1-S2 Respiratory: Clear to auscultation bilaterally Abdomen: Soft, nontender, nondistended, positive bowel sounds Musculoskeletal: No clubbing or cyanosis or pitting edema.  Left upper extremity is swollen Skin: Noted track marks.  Left upper extremity is erythematous Psychiatry: Appropriate, no evidence of psychoses Neurology: No focal deficits  Data Reviewed: Lab work noteworthy for albumin 3.1, potassium 3.4, white blood cell count of 14.6  Disposition:  Status is: Inpatient Remains inpatient appropriate because: Cultures have resolution of infection    Anticipated discharge date: To be determined depending on if patient can be on oral versus IV antibiotics  Family Communication: We will call brother DVT Prophylaxis: enoxaparin (LOVENOX) injection 40 mg Start: 05/16/22 2200    Author: Annita Brod ,MD 05/15/2022 5:09 PM  To reach On-call, see care teams to locate the attending and reach out via  [email or web address] Between 7PM-7AM, please contact night-coverage If you still have difficulty reaching the attending provider, please page the [facility] (Director on Call) for [facility] on amion for assistance.

## 2022-05-15 NOTE — Consult Note (Signed)
ORTHOPAEDIC CONSULTATION  REQUESTING PHYSICIAN: Annita Brod, MD  Chief Complaint: Left arm swelling and erythema  HPI: Trevor Brown is a 31 y.o. male with a history of IV drug use was admitted overnight with increasing redness and swelling after injecting into his left upper arm.  The scan and MRI have been performed which show a large medial arm abscess.  He was admitted to the medical service overnight for IV antibiotics.  He has previously undergone an I&D of his left hand by Dr. Roland Rack in 2021 here at [facility].  Orthopedics is consulted for management of the abscess.  Past Medical History:  Diagnosis Date   Heart attack ([place])    Pt claims he had heart attack a year ago ( 2015)- and was admitted in [facility] for that, but not given any meds or angiogram, on review of chart- I could not find any details like that.   Hepatitis C, acute may 2016   Heroin abuse ([place])    History of noncompliance with medical treatment    Hypertension    Type 1 diabetes [facility])    Past Surgical History:  Procedure Laterality Date   INCISION AND DRAINAGE ABSCESS Left 03/04/2020   Procedure: INCISION AND DRAINAGE ABSCESS;  Surgeon: Corky Mull, MD;  Location: [facility] ORS;  Service: Orthopedics;  Laterality: Left;   NO PAST SURGERIES     none     Social History   Socioeconomic History   Marital status: Single    Spouse name: Not on file   Number of children: Not on file   Years of education: Not on file   Highest education level: Not on file  Occupational History   Occupation: tree cutter  Tobacco Use   Smoking status: Every Day    Packs/day: 2.00    Types: Cigarettes   Smokeless tobacco: Never  Vaping Use   Vaping Use: Never used  Substance and Sexual Activity   Alcohol use: No    Alcohol/week: 0.0 standard drinks of alcohol   Drug use: Not Currently    Types: IV, Heroin, Marijuana    Comment: heroin-    Sexual activity: Yes  Other Topics Concern   Not on file  Social History Narrative    Not on file   Social Determinants of Health   Financial Resource Strain: High Risk (04/01/2019)   Overall Financial Resource Strain (CARDIA)    Difficulty of Paying Living Expenses: Hard  Food Insecurity: No Food Insecurity (04/01/2019)   Hunger Vital Sign    Worried About Running Out of Food in the Last Year: Never true    Ran Out of Food in the Last Year: Never true  Transportation Needs: No Transportation Needs (12/28/2020)   PRAPARE - Hydrologist (Medical): No    Lack of Transportation (Non-Medical): No  Physical Activity: Sufficiently Active (04/01/2019)   Exercise Vital Sign    Days of Exercise per Week: 7 days    Minutes of Exercise per Session: 60 min  Stress: Not on file  Social Connections: Moderately Isolated (04/01/2019)   Social Connection and Isolation Panel [NHANES]    Frequency of Communication with Friends and Family: More than three times a week    Frequency of Social Gatherings with Friends and Family: More than three times a week    Attends Religious Services: Never    Marine scientist or Organizations: No    Attends Archivist Meetings: Never    Marital  Status: Living with partner   Family History  Problem Relation Age of Onset   Cirrhosis Mother    Diabetes Mellitus II Maternal Grandmother    Diabetes Mellitus II Maternal Grandfather    CAD Father    Allergies  Allergen Reactions   Bee Venom Anaphylaxis   Ibuprofen Itching, Rash and Other (See Comments)    Other reaction(s): Other (See Comments) Stomach upset Reaction:  GI upset    Tramadol Hives and Swelling   Vancomycin Rash and Other (See Comments)    Reaction:  Red man's syndrome    Prior to Admission medications   Medication Sig Start Date End Date Taking? Authorizing Provider  blood glucose meter kit and supplies KIT USE AS DIRECTED UP TO 4 TIMES DAILY. 04/18/21   Harvest Dark, MD  ferrous sulfate 325 (65 FE) MG tablet Take 1 tablet (325 mg  total) by mouth 2 (two) times daily. 11/30/21 02/28/22  Val Riles, MD  gabapentin (NEURONTIN) 400 MG capsule Take 1 capsule (400 mg total) by mouth 2 (two) times daily. 11/30/21 12/30/21  Val Riles, MD  glucose blood (FREESTYLE LITE) test strip To be used as directed for checking her blood sugar 01/08/21   Sreenath, Sudheer B, MD  glucose blood (RIGHTEST GS550 BLOOD GLUCOSE) test strip USE AS DIRECTED UP TO 4 TIMES DAILY. 04/19/21   Cammy Copa, RPH  Insulin Glargine (BASAGLAR KWIKPEN) 100 UNIT/ML Inject 30 Units into the skin 2 (two) times daily. 11/30/21   Val Riles, MD  insulin lispro (HUMALOG KWIKPEN) 100 UNIT/ML KwikPen Inject 5-10 Units into the skin 3 (three) times daily. 11/30/21   Val Riles, MD  Insulin Pen Needle 32G X 4 MM MISC USE AS DIRECTED WITH INSULIN PENS. 01/08/21   Cammy Copa, RPH  Insulin Pen Needle 32G X 4 MM MISC Use as directed with insulin pens. 11/30/21   Cammy Copa, RPH  pantoprazole (PROTONIX) 40 MG tablet Take 1 tablet (40 mg total) by mouth once daily. 11/30/21 02/28/22  Val Riles, MD  Rightest GL300 Lancets MISC USE AS DIRECTED UP TO 4 TIMES DAILY. 04/19/21   Cammy Copa, [facility]   MR HUMERUS LEFT W WO CONTRAST  Result Date: 05/15/2022 CLINICAL DATA:  Patient presents emergency department with left upper arm infection. EXAM: MRI OF THE LEFT HUMERUS WITHOUT AND WITH CONTRAST TECHNIQUE: Multiplanar, multisequence MR imaging of the left humerus was performed before and after the administration of intravenous contrast. CONTRAST:  15m GADAVIST GADOBUTROL 1 MMOL/ML IV SOLN COMPARISON:  CT humerus 05/14/2022 FINDINGS: Bones/Joint/Cartilage No fracture or dislocation. Normal alignment. No joint effusion. No marrow signal abnormality. Muscles and Tendons Mild edema in the upper medial aspect of the biceps muscle concerning for infectious myositis. No intramuscular fluid collection or hematoma. Perifascial edema and enhancement around the biceps muscle  concerning for fasciitis. Soft tissue Severe soft tissue edema circumferentially around the left upper arm. Complex fluid collection in the subcutaneous fat along the medial aspect of the upper arm measuring 6.7 x 3.1 x 10.7 cm most consistent with a large abscess. No soft tissue mass. IMPRESSION: 1. Severe soft tissue edema circumferentially around the left upper arm. Complex fluid collection in the subcutaneous fat along the medial aspect of the upper arm measuring 6.7 x 3.1 x 10.7 cm most consistent with a large abscess. 2. Mild edema in the upper medial aspect of the biceps muscle concerning for infectious myositis. Perifascial edema and enhancement around the biceps muscle concerning for fasciitis. Electronically Signed  By: Kathreen Devoid M.D.   On: 05/15/2022 08:43   CT HUMERUS LEFT W CONTRAST  Result Date: 05/14/2022 CLINICAL DATA:  Soft tissue mass/abscess. EXAM: CT OF THE UPPER LEFT EXTREMITY WITH CONTRAST TECHNIQUE: Multidetector CT imaging of the upper left extremity was performed according to the standard protocol following intravenous contrast administration. RADIATION DOSE REDUCTION: This exam was performed according to the departmental dose-optimization program which includes automated exposure control, adjustment of the mA and/or kV according to patient size and/or use of iterative reconstruction technique. CONTRAST:  137m OMNIPAQUE IOHEXOL 300 MG/ML  SOLN COMPARISON:  None Available. FINDINGS: Bones/Joint/Cartilage No acute osseous abnormality. No appreciable joint effusion. No cortical erosion or periosteal reaction. Ligaments Suboptimally assessed by CT. Muscles and Tendons There is no definite intramuscular collection or abscess, large subcutaneous medial abscess, likely a abuts the muscles and does not extend into the muscle bellies, evaluation is however somewhat limited. Soft tissues There is a peripherally enhancing collection on the medial aspect of the mid arm measuring at least 3.3 x  5.8 x 9.7 cm. This process involves the subcutaneous soft tissues and abuts the biceps muscle and medial part of the triceps muscle. IMPRESSION: 1. Peripherally enhancing abscess on the medial aspect of the mid arm measuring at least 3.3 x 5.8 x 9.7 cm. This process involves the subcutaneous soft tissues and abuts the biceps muscle and medial part of the triceps muscle. This process does not appear to extend into the muscles, clinical correlation and further evaluation with MR examination for intramuscular extension is suggested. 2. No acute osseous abnormality. Electronically Signed   By: IKeane PoliceD.O.   On: 05/14/2022 17:58    Positive ROS: All other systems have been reviewed and were otherwise negative with the exception of those mentioned in the HPI and as above.  Physical Exam: General: Alert, no acute distress  MUSCULOSKELETAL: Left upper arm: Patient has a dry dressing over the left arm.  There is diffuse swelling throughout the lower half of the arm with erythema extending proximally.  He is neurovascular intact.  His forearm and arm compartments are soft and compressible.  Assessment: Left medial arm abscess  Plan: I explained to the patient that he has a serious infection of his left arm.  I am going to perform an I&D around midday when the OR could give me time.  Patient had a small cup of orange juice approximately 4-6 mL.  Patient's case however is emergent and I am going to proceed with the I&D scheduled.  Reviewed the details of the operation as well as the postoperative course with the patient. I discussed the risks and benefits of surgery. The risks include but are not limited to persistent, progressive or recurrent infection quiring further I&D, bleeding , nerve or blood vessel injury, joint stiffness or loss of motion, persistent pain, weakness or instability and the need for further surgery including amputation of the left upper extremity.  Medical risks include but are not  limited to DVT and pulmonary embolism, myocardial infarction, stroke, pneumonia, respiratory failure and death. Patient understood these risks and wished to proceed.   Patient's white count today is 14.6.  He is hemodynamically stable and afebrile.   KThornton Park MD    05/15/2022 9:21 AM

## 2022-05-15 NOTE — Progress Notes (Signed)
       CROSS COVER NOTE  NAME: Trevor Brown MRN: [id number] DOB : 1990/08/20 ATTENDING PHYSICIAN: Elwyn Reach, MD    Date of Service   05/15/2022   HPI/Events of Note   Medication request received for additional pain medicine. Patent reports IV pain med not providing sustained relief. RN reports he is requesting additional meds 1 hr after Dilaudid administration. Tramadol is a listed allergy for this patient, will order Percocet x1.  Interventions   Assessment/Plan:  Percocet x1     This document was prepared using Dragon voice recognition software and may include unintentional dictation errors.  Neomia Glass DNP, MBA, FNP-BC Nurse Practitioner [facility] Pager [phone]

## 2022-05-15 NOTE — Assessment & Plan Note (Signed)
Unclear as to severity, will check quantitative

## 2022-05-16 ENCOUNTER — Encounter: Payer: Self-pay | Admitting: Orthopedic Surgery

## 2022-05-16 ENCOUNTER — Other Ambulatory Visit: Payer: Self-pay

## 2022-05-16 DIAGNOSIS — L02414 Cutaneous abscess of left upper limb: Secondary | ICD-10-CM

## 2022-05-16 LAB — GLUCOSE, CAPILLARY
Glucose-Capillary: 69 mg/dL — ABNORMAL LOW (ref 70–99)
Glucose-Capillary: 240 mg/dL — ABNORMAL HIGH (ref 70–99)
Glucose-Capillary: 429 mg/dL — ABNORMAL HIGH (ref 70–99)
Glucose-Capillary: 390 mg/dL — ABNORMAL HIGH (ref 70–99)
Glucose-Capillary: 507 mg/dL (ref 70–99)
Glucose-Capillary: 395 mg/dL — ABNORMAL HIGH (ref 70–99)
Glucose-Capillary: 198 mg/dL — ABNORMAL HIGH (ref 70–99)

## 2022-05-16 LAB — ECHOCARDIOGRAM COMPLETE
Area-P 1/2: 4.64 cm2
Height: 64 in
S' Lateral: 2.5 cm
Weight: 1650.8 oz

## 2022-05-16 LAB — BASIC METABOLIC PANEL
Anion gap: 6 (ref 5–15)
BUN: 19 mg/dL (ref 6–20)
CO2: 28 mmol/L (ref 22–32)
Calcium: 7.9 mg/dL — ABNORMAL LOW (ref 8.9–10.3)
Chloride: 99 mmol/L (ref 98–111)
Creatinine, Ser: 0.85 mg/dL (ref 0.61–1.24)
GFR, Estimated: >60 mL/min (ref >60)
Glucose, Bld: 98 mg/dL (ref 70–99)
Potassium: 3.3 mmol/L — ABNORMAL LOW (ref 3.5–5.1)
Sodium: 133 mmol/L — ABNORMAL LOW (ref 135–145)

## 2022-05-16 LAB — PROCALCITONIN: Procalcitonin: 0.22 ng/mL

## 2022-05-16 LAB — CBC
HCT: 25.3 % — ABNORMAL LOW (ref 39.0–52.0)
Hemoglobin: 8.6 g/dL — ABNORMAL LOW (ref 13.0–17.0)
MCH: 29.3 pg (ref 26.0–34.0)
MCHC: 34 g/dL (ref 30.0–36.0)
MCV: 86.1 fL (ref 80.0–100.0)
Platelets: 245 10*3/uL (ref 150–400)
RBC: 2.94 MIL/uL — ABNORMAL LOW (ref 4.22–5.81)
RDW: 12.3 % (ref 11.5–15.5)
WBC: 8.9 10*3/uL (ref 4.0–10.5)
nRBC: 0 % (ref 0.0–0.2)

## 2022-05-16 LAB — GLUCOSE, RANDOM: Glucose, Bld: 485 mg/dL — ABNORMAL HIGH (ref 70–99)

## 2022-05-16 MED ORDER — POTASSIUM CHLORIDE CRYS ER 20 MEQ PO TBCR
20.0000 meq | EXTENDED_RELEASE_TABLET | Freq: Once | ORAL | Status: AC
  Administered 2022-05-16: 20 meq via ORAL
  Filled 2022-05-16: qty 1

## 2022-05-16 MED ORDER — INSULIN ASPART 100 UNIT/ML IJ SOLN
0.0000 [IU] | Freq: Three times a day (TID) | INTRAMUSCULAR | Status: DC
  Administered 2022-05-16 (×2): 9 [IU] via SUBCUTANEOUS
  Administered 2022-05-17 – 2022-05-18 (×2): 5 [IU] via SUBCUTANEOUS
  Administered 2022-05-19: 2 [IU] via SUBCUTANEOUS
  Administered 2022-05-19: 7 [IU] via SUBCUTANEOUS
  Administered 2022-05-19: 5 [IU] via SUBCUTANEOUS
  Administered 2022-05-20: 1 [IU] via SUBCUTANEOUS
  Administered 2022-05-20: 7 [IU] via SUBCUTANEOUS
  Filled 2022-05-16 (×9): qty 1

## 2022-05-16 MED ORDER — VANCOMYCIN HCL 1250 MG/250ML IV SOLN
1250.0000 mg | INTRAVENOUS | Status: DC
  Administered 2022-05-16 – 2022-05-18 (×3): 1250 mg via INTRAVENOUS
  Filled 2022-05-16 (×3): qty 250

## 2022-05-16 MED ORDER — INSULIN ASPART 100 UNIT/ML IJ SOLN
4.0000 [IU] | Freq: Three times a day (TID) | INTRAMUSCULAR | Status: DC
  Administered 2022-05-16 – 2022-05-18 (×6): 4 [IU] via SUBCUTANEOUS
  Filled 2022-05-16 (×5): qty 1

## 2022-05-16 MED ORDER — VANCOMYCIN HCL 1250 MG/250ML IV SOLN: 1250.0000 mg | INTRAVENOUS | Status: DC

## 2022-05-16 MED ORDER — INSULIN ASPART 100 UNIT/ML IJ SOLN
0.0000 [IU] | Freq: Every day | INTRAMUSCULAR | Status: DC
  Administered 2022-05-18: 3 [IU] via SUBCUTANEOUS
  Filled 2022-05-16: qty 1

## 2022-05-16 MED ORDER — INSULIN ASPART 100 UNIT/ML IJ SOLN
25.0000 [IU] | Freq: Once | INTRAMUSCULAR | Status: AC
  Administered 2022-05-16: 25 [IU] via SUBCUTANEOUS
  Filled 2022-05-16: qty 1

## 2022-05-16 NOTE — Consult Note (Signed)
Pharmacy Antibiotic Note  Trevor Brown is a 31 y.o. male with PMH including T1DM, depression, hepatitis C, hidradenitis suppurativa, polysubstance abuse / IVDU admitted on 05/14/2022 with  left upper arm infection / abscesses .  Pharmacy has been consulted for vancomycin and cefepime dosing.  Plan: Cefepime 2 g IV q8h Increase from 500mg  q12 to 1250mg  q24h Goal AUC 400-550  Est AUC: 495.6 Est Cmax: 44.5 Est Cmin: 8.3 Calculated with SCr 0.85 mg/dL  CrCl & Ke calculated with TBW  Vd coefficient of 0.72   Height: 5\' 4"  (162.6 cm) Weight: 46.8 kg (103 lb 2.8 oz) IBW/kg (Calculated) : 59.2  Temp (24hrs), Avg:98.4 F (36.9 C), Min:97.4 F (36.3 C), Max:99.2 F (37.3 C)  Recent Labs  Lab 05/14/22 1610 05/14/22 2112 05/15/22 0447 05/16/22 0722  WBC 14.5*  --  14.6* 8.9  CREATININE 1.06  --  0.90 0.85  LATICACIDVEN 4.6* 1.1  --   --      Estimated Creatinine Clearance: 84.9 mL/min (by C-G formula based on SCr of 0.85 mg/dL).    Allergies  Allergen Reactions   Bee Venom Anaphylaxis   Ibuprofen Itching, Rash and Other (See Comments)    Other reaction(s): Other (See Comments) Stomach upset Reaction:  GI upset    Tramadol Hives and Swelling   Vancomycin Rash and Other (See Comments)    Reaction:  Red man's syndrome     Antimicrobials this admission: Vancomycin 10/24 >>  Cefepime 10/24 >>   Dose adjustments this admission: 10/24 Vanc 500 q12 > 12/26 Vanc 1250 q24  Microbiology results: 10/24 BCx: NGTD 10/25 Left Arm Abscess: Abundant GPC in pairs+ Abundant GNR   Thank you for allowing pharmacy to be a part of this patient's care.  Darrick Penna 05/16/2022 10:53 AM

## 2022-05-16 NOTE — Anesthesia Postprocedure Evaluation (Signed)
Anesthesia Post Note  Patient: Trevor Brown  Procedure(s) Performed: INCISION AND DRAINAGE (Left)  Patient location during evaluation: PACU Anesthesia Type: General Level of consciousness: awake and alert Pain management: pain level controlled Vital Signs Assessment: post-procedure vital signs reviewed and stable Respiratory status: spontaneous breathing, nonlabored ventilation, respiratory function stable and patient connected to nasal cannula oxygen Cardiovascular status: blood pressure returned to baseline and stable Postop Assessment: no apparent nausea or vomiting Anesthetic complications: no   No notable events documented.   Last Vitals:  Vitals:   05/15/22 2023 05/15/22 2345  BP: 123/75 130/84  Pulse: 89 88  Resp: 20 20  Temp: 37.3 C 37.2 C  SpO2: 100% 100%    Last Pain:  Vitals:   05/16/22 0206  TempSrc:   PainSc: 10-Worst pain ever                 Arita Miss

## 2022-05-16 NOTE — Progress Notes (Signed)
[facility] Progress Note  Patient: Trevor Brown    PXT:[id number]  DOA: 05/14/2022    Date of Service: the patient was seen and examined on 05/16/2022  Brief hospital course: 31 year old male with past medical history of homelessness, polysubstance abuse, hepatitis C, uncontrolled diabetes mellitus type 1 who presented to the emergency room on 10/24 with left upper extremity swelling and pain.  At time of admission, pt was in DKA.  Work-up revealed cellulitis and abscess of left forearm and patient admitted to the hospitalist service and started on IV antibiotics.  Also treated with IV fluids and insulin drip.  DKA resolved by evening of 10/24.  Orthopedic surgery consulted and took patient for I&D on afternoon of 10/25.  Assessment and Plan: Assessment and Plan: * Cellulitis of left upper arm Status post open I&D of medial left arm abscess.  Cultures noting gram-positive cocci in pairs and gram-negative rods.  Currently on cefepime and vancomycin.  Infectious disease to see.  Limit narcotic medication for pain given abuse history  Elevated blood pressure reading No previous history of hypertension although echocardiogram in 2017 noted grade 1 diastolic dysfunction.  Echocardiogram done with results pending  Diabetes mellitus type 1 ([place]) uncontrolled with hyperglycemia Lantus started postop, and patient had dramatic response to sliding scale, likely indicative of noncompliance.  A1c 11.1 higher than in May.  Appreciate diabetes coordinator.  Decrease sliding scale to sensitive and added 3 times daily NovoLog.  Hepatitis C Unclear as to severity, will check quantitative  Polysubstance abuse (HCC) Urine drug screen positive for opiates, but not for cocaine as in the past patient notes currently IV heroin.  Regardless, monitor for withdrawals.  Tobacco abuse We will discuss with patient about nicotine patch.  Homelessness Between this and drug use, makes patient extremely difficult  to set up follow-up appointment and long-term wound care.  Malnutrition of moderate degree (HCC) BMI of 17.7.  Nutrition to see.        Body mass index is 17.71 kg/m.        Consultants: Orthopedic surgery Infectious disease  Procedures: Status post open I&D left forearm 10/25  Antimicrobials: IV cefepime and vancomycin 10/24-present  Code Status: Full code   Subjective: Complains of left arm pain  Objective: Vital signs were reviewed and unremarkable. Vitals:   05/15/22 2345 05/16/22 0803  BP: 130/84 131/78  Pulse: 88 81  Resp: 20 18  Temp: 98.9 F (37.2 C) 98.1 F (36.7 C)  SpO2: 100% 99%    Intake/Output Summary (Last 24 hours) at 05/16/2022 1520 Last data filed at 05/16/2022 1100 Gross per 24 hour  Intake 250 ml  Output 3000 ml  Net -2750 ml   Filed Weights   05/14/22 2051 05/15/22 1123  Weight: 46.8 kg 46.8 kg   Body mass index is 17.71 kg/m.  Exam:  General: Alert and oriented x3, no acute distress, looks slightly emaciated HEENT: Normocephalic, atraumatic, mucous membranes slightly dry Cardiovascular: Regular rate and rhythm, S1-S2 Respiratory: Clear to auscultation bilaterally Abdomen: Soft, nontender, nondistended, positive bowel sounds Musculoskeletal: No clubbing or cyanosis or pitting edema.  Left upper extremity wrapped Skin: Noted track marks.   Psychiatry: Appropriate, no evidence of psychoses Neurology: No focal deficits  Data Reviewed: Procalcitonin is 0.22, potassium 3.3 and sodium of 133.  White blood cell count down to 8.9 with hemoglobin of 8.6  Disposition:  Status is: Inpatient Remains inpatient appropriate because: Cultures have resolution of infection, removal of drains    Anticipated discharge date:  To be determined depending on if patient can be on oral versus IV antibiotics  Family Communication: We will call brother DVT Prophylaxis: enoxaparin (LOVENOX) injection 40 mg Start: 05/16/22  2200    Author: Hollice Espy ,MD 05/16/2022 3:20 PM  To reach On-call, see care teams to locate the attending and reach out via [email or web address]. Between 7PM-7AM, please contact night-coverage If you still have difficulty reaching the attending provider, please page the [facility] (Director on Call) for [facility] on amion for assistance.

## 2022-05-16 NOTE — Progress Notes (Addendum)
Subjective:  POD #1 s/p I&D of left upper arm abscess.  Patient is a an IV drug user with uncontrolled diabetes.   His white count is down to 8.9 from 14.6 yesterday.  His blood sugar on admission was 611 hemoglobin A1c on admission was 11.1.  The patient reports left arm pain as marked.    Objective:   VITALS:   Vitals:   05/15/22 1710 05/15/22 2023 05/15/22 2345 05/16/22 0803  BP: (!) 129/92 123/75 130/84 131/78  Pulse: 76 89 88 81  Resp: 16 20 20 18   Temp: 98.2 F (36.8 C) 99.2 F (37.3 C) 98.9 F (37.2 C) 98.1 F (36.7 C)  TempSrc:      SpO2: 100% 100% 100% 99%  Weight:      Height:        PHYSICAL EXAM: Patient is sitting in bed watching television, sitting comfortably in no acute distress.  Patient initially states he is doing well but then states he is having pain in his upper arm. Patient's dressing is clean dry and intact.  Patient has full digital range of motion in all fingers of the left hand and intact sensation light touch throughout the left hand and forearm and shoulder.  The forearm and arm compartments are soft and compressible.  He has a palpable radial pulse.   LABS  Results for orders placed or performed during the hospital encounter of 05/14/22 (from the past 24 hour(s))  Aerobic/Anaerobic Culture w Gram Stain (surgical/deep wound)     Status: None (Preliminary result)   Collection Time: 05/15/22  1:30 PM   Specimen: PATH Other; Tissue  Result Value Ref Range   Specimen Description      ABSCESS Performed at [facility], [address] [place], [place] [address]    Special Requests      LEFT UPPER ARM Performed at [facility], [address] [place], [place] [address]    Gram Stain      ABUNDANT WBC PRESENT,BOTH PMN AND MONONUCLEAR ABUNDANT GRAM POSITIVE COCCI IN PAIRS ABUNDANT GRAM NEGATIVE RODS    Culture      NO GROWTH < 12 HOURS Performed at [facility], [place] [address], [place], [place] [address]    Report  Status PENDING   Aerobic/Anaerobic Culture w Gram Stain (surgical/deep wound)     Status: None (Preliminary result)   Collection Time: 05/15/22  1:37 PM   Specimen: PATH Other; Tissue  Result Value Ref Range   Specimen Description      ABSCESS Performed at [facility], [address] [place], [place] [address]    Special Requests      LEFT UPPER ARM ABSCESS Performed at [facility], [address] [place], [place] [address]    Gram Stain      FEW WBC PRESENT,BOTH PMN AND MONONUCLEAR MODERATE GRAM POSITIVE COCCI IN PAIRS FEW GRAM NEGATIVE RODS    Culture      NO GROWTH < 12 HOURS Performed at [facility], [place] [address], [place], [place] [address]    Report Status PENDING   Aerobic/Anaerobic Culture w Gram Stain (surgical/deep wound)     Status: None (Preliminary result)   Collection Time: 05/15/22  1:44 PM   Specimen: PATH Other; Tissue  Result Value Ref Range   Specimen Description      ABSCESS Performed at [facility], [address] [place], [place] [address]    Special Requests  ANTERIOR ARM ABSCESS Performed at [facility], [place], [place] [address]    Gram Stain      ABUNDANT WBC PRESENT,BOTH PMN AND MONONUCLEAR ABUNDANT GRAM POSITIVE COCCI IN PAIRS ABUNDANT GRAM NEGATIVE RODS    Culture      NO GROWTH < 12 HOURS Performed at [facility], [place] [address], [place], [place] [address]    Report Status PENDING   Aerobic/Anaerobic Culture w Gram Stain (surgical/deep wound)     Status: None (Preliminary result)   Collection Time: 05/15/22  1:49 PM   Specimen: PATH Other; Tissue  Result Value Ref Range   Specimen Description      ABSCESS Performed at [facility], [address], [place], [place] [address]    Special Requests      LEFT UPPER ARM ABSCESS Performed at [facility], [place], [place] [address]    Gram Stain      FEW WBC PRESENT,BOTH  PMN AND MONONUCLEAR ABUNDANT GRAM NEGATIVE RODS ABUNDANT GRAM POSITIVE COCCI IN CLUSTERS    Culture      NO GROWTH < 12 HOURS Performed at [facility], [place] [address] [place], [place] [address]    Report Status PENDING   Glucose, capillary     Status: None   Collection Time: 05/15/22  3:07 PM  Result Value Ref Range   Glucose-Capillary 94 70 - 99 mg/dL  Glucose, capillary     Status: Abnormal   Collection Time: 05/15/22  5:14 PM  Result Value Ref Range   Glucose-Capillary 184 (H) 70 - 99 mg/dL  Glucose, random     Status: Abnormal   Collection Time: 05/15/22  5:30 PM  Result Value Ref Range   Glucose, Bld 197 (H) 70 - 99 mg/dL  Brain natriuretic peptide     Status: None   Collection Time: 05/15/22  5:48 PM  Result Value Ref Range   B Natriuretic Peptide 41.0 0.0 - 100.0 pg/mL  Hemoglobin A1c     Status: Abnormal   Collection Time: 05/15/22  5:48 PM  Result Value Ref Range   Hgb A1c MFr Bld 11.1 (H) 4.8 - 5.6 %   Mean Plasma Glucose 271.87 mg/dL  Glucose, capillary     Status: Abnormal   Collection Time: 05/15/22 10:05 PM  Result Value Ref Range   Glucose-Capillary 552 (HH) 70 - 99 mg/dL   Comment 1 Notify RN    Comment 2 Document in Chart   Glucose, capillary     Status: Abnormal   Collection Time: 05/15/22 10:09 PM  Result Value Ref Range   Glucose-Capillary 523 (HH) 70 - 99 mg/dL   Comment 1 Notify RN    Comment 2 Document in Chart   Glucose, random     Status: Abnormal   Collection Time: 05/16/22 12:03 AM  Result Value Ref Range   Glucose, Bld 485 (H) 70 - 99 mg/dL  Basic metabolic panel     Status: Abnormal   Collection Time: 05/16/22  7:22 AM  Result Value Ref Range   Sodium 133 (L) 135 - 145 mmol/L   Potassium 3.3 (L) 3.5 - 5.1 mmol/L   Chloride 99 98 - 111 mmol/L   CO2 28 22 - 32 mmol/L   Glucose, Bld 98 70 - 99 mg/dL   BUN 19 6 - 20 mg/dL   Creatinine, Ser 0.85 0.61 - 1.24 mg/dL   Calcium 7.9 (L) 8.9 - 10.3 mg/dL  GFR, Estimated >60 >60 mL/min    Anion gap 6 5 - 15  Procalcitonin     Status: None   Collection Time: 05/16/22  7:22 AM  Result Value Ref Range   Procalcitonin 0.22 ng/mL  CBC     Status: Abnormal   Collection Time: 05/16/22  7:22 AM  Result Value Ref Range   WBC 8.9 4.0 - 10.5 K/uL   RBC 2.94 (L) 4.22 - 5.81 MIL/uL   Hemoglobin 8.6 (L) 13.0 - 17.0 g/dL   HCT 25.3 (L) 39.0 - 52.0 %   MCV 86.1 80.0 - 100.0 fL   MCH 29.3 26.0 - 34.0 pg   MCHC 34.0 30.0 - 36.0 g/dL   RDW 12.3 11.5 - 15.5 %   Platelets 245 150 - 400 K/uL   nRBC 0.0 0.0 - 0.2 %  Glucose, capillary     Status: Abnormal   Collection Time: 05/16/22  8:01 AM  Result Value Ref Range   Glucose-Capillary 69 (L) 70 - 99 mg/dL  Glucose, capillary     Status: Abnormal   Collection Time: 05/16/22  9:36 AM  Result Value Ref Range   Glucose-Capillary 240 (H) 70 - 99 mg/dL  Glucose, capillary     Status: Abnormal   Collection Time: 05/16/22 12:01 PM  Result Value Ref Range   Glucose-Capillary 429 (H) 70 - 99 mg/dL   Comment 1 Notify RN     MR HUMERUS LEFT W WO CONTRAST  Result Date: 05/15/2022 CLINICAL DATA:  Patient presents emergency department with left upper arm infection. EXAM: MRI OF THE LEFT HUMERUS WITHOUT AND WITH CONTRAST TECHNIQUE: Multiplanar, multisequence MR imaging of the left humerus was performed before and after the administration of intravenous contrast. CONTRAST:  34mL GADAVIST GADOBUTROL 1 MMOL/ML IV SOLN COMPARISON:  CT humerus 05/14/2022 FINDINGS: Bones/Joint/Cartilage No fracture or dislocation. Normal alignment. No joint effusion. No marrow signal abnormality. Muscles and Tendons Mild edema in the upper medial aspect of the biceps muscle concerning for infectious myositis. No intramuscular fluid collection or hematoma. Perifascial edema and enhancement around the biceps muscle concerning for fasciitis. Soft tissue Severe soft tissue edema circumferentially around the left upper arm. Complex fluid collection in the subcutaneous fat along  the medial aspect of the upper arm measuring 6.7 x 3.1 x 10.7 cm most consistent with a large abscess. No soft tissue mass. IMPRESSION: 1. Severe soft tissue edema circumferentially around the left upper arm. Complex fluid collection in the subcutaneous fat along the medial aspect of the upper arm measuring 6.7 x 3.1 x 10.7 cm most consistent with a large abscess. 2. Mild edema in the upper medial aspect of the biceps muscle concerning for infectious myositis. Perifascial edema and enhancement around the biceps muscle concerning for fasciitis. Electronically Signed   By: Kathreen Devoid M.D.   On: 05/15/2022 08:43   CT HUMERUS LEFT W CONTRAST  Result Date: 05/14/2022 CLINICAL DATA:  Soft tissue mass/abscess. EXAM: CT OF THE UPPER LEFT EXTREMITY WITH CONTRAST TECHNIQUE: Multidetector CT imaging of the upper left extremity was performed according to the standard protocol following intravenous contrast administration. RADIATION DOSE REDUCTION: This exam was performed according to the departmental dose-optimization program which includes automated exposure control, adjustment of the mA and/or kV according to patient size and/or use of iterative reconstruction technique. CONTRAST:  132mL OMNIPAQUE IOHEXOL 300 MG/ML  SOLN COMPARISON:  None Available. FINDINGS: Bones/Joint/Cartilage No acute osseous abnormality. No appreciable joint effusion. No cortical erosion or periosteal reaction. Ligaments Suboptimally assessed by  CT. Muscles and Tendons There is no definite intramuscular collection or abscess, large subcutaneous medial abscess, likely a abuts the muscles and does not extend into the muscle bellies, evaluation is however somewhat limited. Soft tissues There is a peripherally enhancing collection on the medial aspect of the mid arm measuring at least 3.3 x 5.8 x 9.7 cm. This process involves the subcutaneous soft tissues and abuts the biceps muscle and medial part of the triceps muscle. IMPRESSION: 1. Peripherally  enhancing abscess on the medial aspect of the mid arm measuring at least 3.3 x 5.8 x 9.7 cm. This process involves the subcutaneous soft tissues and abuts the biceps muscle and medial part of the triceps muscle. This process does not appear to extend into the muscles, clinical correlation and further evaluation with MR examination for intramuscular extension is suggested. 2. No acute osseous abnormality. Electronically Signed   By: Keane Police D.O.   On: 05/14/2022 17:58    Assessment/Plan: 1 Day Post-Op   Principal Problem:   Cellulitis of left upper arm Active Problems:   Homelessness   Hepatitis C   Malnutrition of moderate degree (HCC)   Tobacco abuse   Diabetes mellitus type 1 ([place]) uncontrolled with hyperglycemia   Polysubstance abuse (HCC)   Elevated blood pressure reading  Patient is stable postop.  He is currently afebrile.  His white blood cell count has come down dramatically.  His blood sugars currently 98 after being 485 just after midnight.  Vital signs are stable.  Gram stain's from cultures in the OR yesterday are demonstrating gram-positive cocci in pairs and gram-negative rods.  Ultras are pending.  He is receiving IV cefepime.  Patient has drains in place at the surgical site which will be removed tomorrow.  The dressing should remain in place today.  Patient should support his left arm in a sling when he is standing or ambulating.  Patient is on Lovenox for DVT prophylaxis.  Continue blood sugar management per the hospitalist service.  I will continue to follow patient's cultures.  Photo image of the arm intra-operatively is in media tab of the chart.    Thornton Park , MD 05/16/2022, 12:59 PM

## 2022-05-16 NOTE — Inpatient Diabetes Management (Addendum)
Inpatient Diabetes Program Recommendations  AACE/ADA: New Consensus Statement on Inpatient Glycemic Control   Target Ranges:  Prepandial:   less than 140 mg/dL      Peak postprandial:   less than 180 mg/dL (1-2 hours)      Critically ill patients:  140 - 180 mg/dL    Latest Reference Range & Units 05/16/22 08:01  Glucose-Capillary 70 - 99 mg/dL 69 (L)    Latest Reference Range & Units 05/16/22 00:03  Glucose 70 - 99 mg/dL 485 (H)  Novolog 25 units @2 :08     Latest Reference Range & Units 05/15/22 04:16 05/15/22 09:20 05/15/22 11:17 05/15/22 15:07 05/15/22 17:14 05/15/22 22:05 05/15/22 22:09  Glucose-Capillary 70 - 99 mg/dL 191 (H) 298 (H)  Novolog 11 units 235 (H) 94 184 (H)  Novolog 4 units 552 (HH) 523 (HH)    Semglee 15 units @22 :11    Latest Reference Range & Units 05/15/22 17:48  Hemoglobin A1C 4.8 - 5.6 % 11.1 (H)   Review of Glycemic Control  Diabetes history: DM1 (does NOT make any insulin; requires basal, correction, and carb coverage insulin) Outpatient Diabetes medications: Lantus 4-10 units daily, Novolog 70/30 30 units BID Current orders for Inpatient glycemic control: Semglee 15 units BID, Novolog 0-20 units TID with meals, Novolog 0-5 units QHS   Inpatient Diabetes Program Recommendations:     Insulin: Glucose 69 mg/dl at 8:01 am today; likely due to getting Novolog 25 units at 2:08 am.  Please consider increasing adding Novolog 4 units TID with meals for meal coverage if patient eats at least 50% of meals and decreasing Novolog correction to 0-15 units TID with meals.   Addendum 05/16/22@11 :15-Spoke with patient at bedside. Patient reports that he is taking Novolog 70/30 30 units BID and Lantus 4-10 units daily (depending on glucose). Patient states [facility] any issues. Patient reports that he had been going to [facility] but he has not been in a long time (in over a year). Patient reports that he did  not have a glucometer so he has not been checking glucose but confirms that [facility] provided him with a glucometer and testing supplies this admission. Discussed current A1C of 11.1% on 05/15/22 indicating an average glucose of 272 mg/dl over the past 2-3 months. Discussed glucose and A1C goals.  Stressed importance of DM control especially in regards to wound healing and to decrease further complications from uncontrolled DM. Asked patient to call [facility] and request an appointment for the near future for follow up. Patient reports that he is not sure how much Lantus and Novolog 70/30 he has but he asked if he could get Rx for Lantus vials, Novolog 70/30 vials, and insulin syringes at discharge (states he feels like the vials of insulin last longer for him). Informed patient I would let attending provider know about his request. Patient verbalized understanding and has no questions at this time.  Thanks, Barnie Alderman, RN, MSN, [place] [facility] Coordinator Inpatient Diabetes Program [phone] (Team Pager from 8am to [place])

## 2022-05-17 LAB — CBC
HCT: 27.1 % — ABNORMAL LOW (ref 39.0–52.0)
Hemoglobin: 9.1 g/dL — ABNORMAL LOW (ref 13.0–17.0)
MCH: 29.8 pg (ref 26.0–34.0)
MCHC: 33.6 g/dL (ref 30.0–36.0)
MCV: 88.9 fL (ref 80.0–100.0)
Platelets: 246 10*3/uL (ref 150–400)
RBC: 3.05 MIL/uL — ABNORMAL LOW (ref 4.22–5.81)
RDW: 12.6 % (ref 11.5–15.5)
WBC: 7 10*3/uL (ref 4.0–10.5)
nRBC: 0 % (ref 0.0–0.2)

## 2022-05-17 LAB — BASIC METABOLIC PANEL
Anion gap: 6 (ref 5–15)
BUN: 19 mg/dL (ref 6–20)
CO2: 24 mmol/L (ref 22–32)
Calcium: 8.2 mg/dL — ABNORMAL LOW (ref 8.9–10.3)
Chloride: 104 mmol/L (ref 98–111)
Creatinine, Ser: 0.78 mg/dL (ref 0.61–1.24)
GFR, Estimated: >60 mL/min (ref >60)
Glucose, Bld: 235 mg/dL — ABNORMAL HIGH (ref 70–99)
Potassium: 4.1 mmol/L (ref 3.5–5.1)
Sodium: 134 mmol/L — ABNORMAL LOW (ref 135–145)

## 2022-05-17 LAB — GLUCOSE, CAPILLARY
Glucose-Capillary: 268 mg/dL — ABNORMAL HIGH (ref 70–99)
Glucose-Capillary: 115 mg/dL — ABNORMAL HIGH (ref 70–99)
Glucose-Capillary: 174 mg/dL — ABNORMAL HIGH (ref 70–99)

## 2022-05-17 LAB — PROCALCITONIN: Procalcitonin: 0.16 ng/mL

## 2022-05-17 NOTE — Plan of Care (Signed)
  Problem: Coping: Goal: Ability to adjust to condition or change in health will improve Outcome: Progressing   Problem: Fluid Volume: Goal: Ability to maintain a balanced intake and output will improve Outcome: Progressing   Problem: Health Behavior/Discharge Planning: Goal: Ability to identify and utilize available resources and services will improve Outcome: Progressing   Problem: Metabolic: Goal: Ability to maintain appropriate glucose levels will improve Outcome: Progressing   Problem: Skin Integrity: Goal: Risk for impaired skin integrity will decrease Outcome: Progressing   Problem: Activity: Goal: Risk for activity intolerance will decrease Outcome: Progressing   Problem: Nutrition: Goal: Adequate nutrition will be maintained Outcome: Progressing   Problem: Coping: Goal: Level of anxiety will decrease Outcome: Progressing   Problem: Elimination: Goal: Will not experience complications related to bowel motility Outcome: Progressing   Problem: Pain Managment: Goal: General experience of comfort will improve Outcome: Progressing   Problem: Safety: Goal: Ability to remain free from injury will improve Outcome: Progressing   Problem: Skin Integrity: Goal: Risk for impaired skin integrity will decrease Outcome: Progressing

## 2022-05-17 NOTE — Progress Notes (Signed)
Subjective:  POD #2 s/p I&D of left arm abscess and a patient with history of IV drug use and uncontrolled diabetes.   Patient reports left arm pain as mild to moderate.  Patient is on IV antibiotics.  Infectious disease consult has been ordered.  Objective:   VITALS:   Vitals:   05/16/22 0803 05/16/22 1621 05/16/22 2321 05/17/22 0900  BP: 131/78 131/75 (!) 111/55 (!) 144/95  Pulse: 81 97 79 94  Resp: 18 18 20 17   Temp: 98.1 F (36.7 C) 98.3 F (36.8 C) 98.6 F (37 C) 98.1 F (36.7 C)  TempSrc:      SpO2: 99% 99% 100% 100%  Weight:      Height:        PHYSICAL EXAM: Left upper extremity: I personally change the patient's dressing today.  Penrose drains x 2 were removed.  Swelling and erythema have greatly improved.  The patient has resolving ecchymosis at the surgical site.  There is no purulent drainage from any of the wounds.  Remains neurovascular intact throughout the left upper extremity.  Has full digital range of motion and his fingers are well-perfused.  There is no ascending erythema into the upper arm or shoulder.   LABS  Results for orders placed or performed during the hospital encounter of 05/14/22 (from the past 24 hour(s))  Glucose, capillary     Status: Abnormal   Collection Time: 05/16/22 12:52 PM  Result Value Ref Range   Glucose-Capillary 390 (H) 70 - 99 mg/dL  Glucose, capillary     Status: Abnormal   Collection Time: 05/16/22  4:16 PM  Result Value Ref Range   Glucose-Capillary 507 (HH) 70 - 99 mg/dL   Comment 1 Call MD NNP PA CNM   Glucose, capillary     Status: Abnormal   Collection Time: 05/16/22  5:31 PM  Result Value Ref Range   Glucose-Capillary 395 (H) 70 - 99 mg/dL  Glucose, capillary     Status: Abnormal   Collection Time: 05/16/22 10:05 PM  Result Value Ref Range   Glucose-Capillary 198 (H) 70 - 99 mg/dL  Basic metabolic panel     Status: Abnormal   Collection Time: 05/17/22  4:24 AM  Result Value Ref Range   Sodium 134 (L) 135 - 145  mmol/L   Potassium 4.1 3.5 - 5.1 mmol/L   Chloride 104 98 - 111 mmol/L   CO2 24 22 - 32 mmol/L   Glucose, Bld 235 (H) 70 - 99 mg/dL   BUN 19 6 - 20 mg/dL   Creatinine, Ser 0.78 0.61 - 1.24 mg/dL   Calcium 8.2 (L) 8.9 - 10.3 mg/dL   GFR, Estimated >60 >60 mL/min   Anion gap 6 5 - 15  CBC     Status: Abnormal   Collection Time: 05/17/22  4:24 AM  Result Value Ref Range   WBC 7.0 4.0 - 10.5 K/uL   RBC 3.05 (L) 4.22 - 5.81 MIL/uL   Hemoglobin 9.1 (L) 13.0 - 17.0 g/dL   HCT 27.1 (L) 39.0 - 52.0 %   MCV 88.9 80.0 - 100.0 fL   MCH 29.8 26.0 - 34.0 pg   MCHC 33.6 30.0 - 36.0 g/dL   RDW 12.6 11.5 - 15.5 %   Platelets 246 150 - 400 K/uL   nRBC 0.0 0.0 - 0.2 %  Procalcitonin     Status: None   Collection Time: 05/17/22  4:24 AM  Result Value Ref Range   Procalcitonin 0.16 ng/mL  Glucose, capillary     Status: Abnormal   Collection Time: 05/17/22  9:02 AM  Result Value Ref Range   Glucose-Capillary 268 (H) 70 - 99 mg/dL    ECHOCARDIOGRAM COMPLETE  Result Date: 05/16/2022    ECHOCARDIOGRAM REPORT   Patient Name:   Trevor Brown Date of Exam: 05/15/2022 Medical Rec #:  [id number]     Height:       64.0 in Accession #:    [id number]    Weight:       103.2 lb Date of Birth:  1991-02-11     BSA:          1.477 m Patient Age:    31 years      BP:           123/78 mmHg Patient Gender: M             HR:           85 bpm. Exam Location:  [facility] Procedure: 2D Echo, Cardiac Doppler and Color Doppler Indications:     L97.67 Acute Diastolic CHF  History:         Patient has prior history of Echocardiogram examinations, most                  recent 12/13/2015. Risk Factors:Hypertension, Diabetes and                  Substance abuse.  Sonographer:     Cresenciano Lick RDCS Referring Phys:  [place] Diagnosing Phys: Ida Rogue MD IMPRESSIONS  1. Left ventricular ejection fraction, by estimation, is 60 to 65%. The left ventricle has normal function. The left ventricle has no regional wall  motion abnormalities. Left ventricular diastolic parameters were normal.  2. Right ventricular systolic function is normal. The right ventricular size is normal. There is normal pulmonary artery systolic pressure. The estimated right ventricular systolic pressure is 34.1 mmHg.  3. The mitral valve is normal in structure. Mild mitral valve regurgitation. No evidence of mitral stenosis.  4. The aortic valve has an indeterminant number of cusps. Aortic valve regurgitation is not visualized. No aortic stenosis is present.  5. The inferior vena cava is normal in size with greater than 50% respiratory variability, suggesting right atrial pressure of 3 mmHg. FINDINGS  Left Ventricle: Left ventricular ejection fraction, by estimation, is 60 to 65%. The left ventricle has normal function. The left ventricle has no regional wall motion abnormalities. The left ventricular internal cavity size was normal in size. There is  no left ventricular hypertrophy. Left ventricular diastolic parameters were normal. Right Ventricle: The right ventricular size is normal. No increase in right ventricular wall thickness. Right ventricular systolic function is normal. There is normal pulmonary artery systolic pressure. The tricuspid regurgitant velocity is 2.47 m/s, and  with an assumed right atrial pressure of 5 mmHg, the estimated right ventricular systolic pressure is 93.7 mmHg. Left Atrium: Left atrial size was normal in size. Right Atrium: Right atrial size was normal in size. Pericardium: There is no evidence of pericardial effusion. Mitral Valve: The mitral valve is normal in structure. Mild mitral valve regurgitation. No evidence of mitral valve stenosis. Tricuspid Valve: The tricuspid valve is normal in structure. Tricuspid valve regurgitation is mild . No evidence of tricuspid stenosis. Aortic Valve: The aortic valve has an indeterminant number of cusps. Aortic valve regurgitation is not visualized. No aortic stenosis is present.  Pulmonic Valve: The pulmonic valve was normal in  structure. Pulmonic valve regurgitation is not visualized. No evidence of pulmonic stenosis. Aorta: The aortic root is normal in size and structure. Venous: The inferior vena cava is normal in size with greater than 50% respiratory variability, suggesting right atrial pressure of 3 mmHg. IAS/Shunts: No atrial level shunt detected by color flow Doppler.  LEFT VENTRICLE PLAX 2D LVIDd:         4.00 cm   Diastology LVIDs:         2.50 cm   LV e' medial:    12.85 cm/s LV PW:         0.80 cm   LV E/e' medial:  6.5 LV IVS:        0.70 cm   LV e' lateral:   15.85 cm/s LVOT diam:     1.90 cm   LV E/e' lateral: 5.3 LV SV:         57 LV SV Index:   38 LVOT Area:     2.84 cm  RIGHT VENTRICLE             IVC RV Basal diam:  3.00 cm     IVC diam: 1.40 cm RV S prime:     16.75 cm/s TAPSE (M-mode): 2.8 cm LEFT ATRIUM             Index        RIGHT ATRIUM          Index LA diam:        3.00 cm 2.03 cm/m   RA Area:     8.66 cm LA Vol (A2C):   21.6 ml 14.63 ml/m  RA Volume:   17.60 ml 11.92 ml/m LA Vol (A4C):   22.3 ml 15.10 ml/m LA Biplane Vol: 23.7 ml 16.05 ml/m  AORTIC VALVE LVOT Vmax:   116.67 cm/s LVOT Vmean:  77.467 cm/s LVOT VTI:    0.200 m  AORTA Ao Root diam: 2.80 cm MITRAL VALVE               TRICUSPID VALVE MV Area (PHT): 4.64 cm    TR Peak grad:   24.4 mmHg MV Decel Time: 164 msec    TR Vmax:        247.00 cm/s MV E velocity: 83.90 cm/s MV A velocity: 64.60 cm/s  SHUNTS MV E/A ratio:  1.30        Systemic VTI:  0.20 m                            Systemic Diam: 1.90 cm Ida Rogue MD Electronically signed by Ida Rogue MD Signature Date/Time: 05/16/2022/3:35:10 PM    Final     Assessment/Plan: 2 Days Post-Op   Principal Problem:   Cellulitis of left upper arm Active Problems:   Homelessness   Hepatitis C   Malnutrition of moderate degree (HCC)   Tobacco abuse   Diabetes mellitus type 1 (HCC) uncontrolled with hyperglycemia   Polysubstance abuse  (HCC)   Elevated blood pressure reading  I change the patient's dressing today.  Xeroform was applied over the surgical site along with a dry sterile dressing.  Patient is on IV cefepime and vancomycin.  His white blood cell count today is 7.  His last glucose was 268.  He is afebrile hemodynamically stable..  Patient's cultures are growing out gram-positive cocci in pairs along with gram-negative rods.  Will await infectious disease recommendations regarding antibiotic treatment and duration.  Patient will remain an inpatient until a discharge plan is formulated.  Patient has a history of IV drug use which will be to be a serious consideration in regards to the PICC line use.  Patient will need to continue to work on more consistent glucose control.    Thornton Park , MD 05/17/2022, 12:02 PM

## 2022-05-17 NOTE — Progress Notes (Signed)
[facility] Progress Note  Patient: Trevor Brown    XTG:[id number]  DOA: 05/14/2022    Date of Service: the patient was seen and examined on 05/17/2022  Brief hospital course: 31 year old male with past medical history of homelessness, polysubstance abuse, hepatitis C, uncontrolled diabetes mellitus type 1 who presented to the emergency room on 10/24 with left upper extremity swelling and pain.  At time of admission, pt was in DKA.  Work-up revealed cellulitis and abscess of left forearm and patient admitted to the hospitalist service and started on IV antibiotics.  Also treated with IV fluids and insulin drip.  DKA resolved by evening of 10/24.  Orthopedic surgery consulted and took patient for I&D on afternoon of 10/25.  Assessment and Plan: Assessment and Plan: * Cellulitis of left upper arm Status post open I&D of medial left arm abscess.  Cultures noting gram-positive cocci in pairs and gram-negative rods.  Currently on cefepime and vancomycin.  Infectious disease to see.  Limit narcotic medication for pain given abuse history  Elevated blood pressure reading No previous history of hypertension although echocardiogram in 2017 noted grade 1 diastolic dysfunction.  Echocardiogram done with results pending  Diabetes mellitus type 1 (HCC) uncontrolled with hyperglycemia Lantus started postop, and patient had dramatic response to sliding scale, likely indicative of noncompliance.  A1c 11.1 higher than in May.  Appreciate diabetes coordinator.  Decrease sliding scale to sensitive and added 3 times daily NovoLog.  CBGs starting to stabilize  Hepatitis C Unclear as to severity, will check quantitative  Polysubstance abuse (HCC) Urine drug screen positive for opiates, but not for cocaine as in the past patient notes currently IV heroin.  Regardless, monitor for withdrawals.  Tobacco abuse Patient declines nicotine patch at this time  Homelessness Between this and drug use, makes  patient extremely difficult to set up follow-up appointment and long-term wound care.  Malnutrition of moderate degree (HCC) BMI of 17.7.  Nutrition to see.        Body mass index is 17.71 kg/m.        Consultants: Orthopedic surgery Infectious disease  Procedures: Status post open I&D left forearm 10/25  Antimicrobials: IV cefepime and vancomycin 10/24-present  Code Status: Full code   Subjective: Continues to have left arm pain Objective: Vital signs were reviewed and unremarkable. Vitals:   05/17/22 0900 05/17/22 1450  BP: (!) 144/95 132/82  Pulse: 94 95  Resp: 17 17  Temp: 98.1 F (36.7 C) 98 F (36.7 C)  SpO2: 100% 100%    Intake/Output Summary (Last 24 hours) at 05/17/2022 1632 Last data filed at 05/17/2022 1300 Gross per 24 hour  Intake 3208.01 ml  Output 700 ml  Net 2508.01 ml    Filed Weights   05/14/22 2051 05/15/22 1123  Weight: 46.8 kg 46.8 kg   Body mass index is 17.71 kg/m.  Exam:  General: Alert and oriented x3, no acute distress, looks slightly emaciated HEENT: Normocephalic, atraumatic, mucous membranes slightly dry Cardiovascular: Regular rate and rhythm, S1-S2 Respiratory: Clear to auscultation bilaterally Abdomen: Soft, nontender, nondistended, positive bowel sounds Musculoskeletal: No clubbing or cyanosis or pitting edema.  Left upper extremity wrapped Skin: Noted track marks.   Psychiatry: Appropriate, no evidence of psychoses Neurology: No focal deficits  Data Reviewed: Procalcitonin continue to trend downward.  CBGs improving.  Disposition:  Status is: Inpatient Remains inpatient appropriate because: Cultures have resolution of infection -Stay ulceration and blood sugars    Anticipated discharge date: To be determined depending on  if patient can be on oral versus IV antibiotics  Family Communication: We will call brother DVT Prophylaxis: enoxaparin (LOVENOX) injection 40 mg Start: 05/16/22  2200    Author: Annita Brod ,MD 05/17/2022 4:32 PM  To reach On-call, see care teams to locate the attending and reach out via [email or web address]. Between 7PM-7AM, please contact night-coverage If you still have difficulty reaching the attending provider, please page the [facility] (Director on Call) for [facility] on amion for assistance.

## 2022-05-17 NOTE — Plan of Care (Signed)

## 2022-05-18 LAB — AEROBIC/ANAEROBIC CULTURE W GRAM STAIN (SURGICAL/DEEP WOUND)

## 2022-05-18 LAB — GLUCOSE, CAPILLARY
Glucose-Capillary: 114 mg/dL — ABNORMAL HIGH (ref 70–99)
Glucose-Capillary: 274 mg/dL — ABNORMAL HIGH (ref 70–99)
Glucose-Capillary: 269 mg/dL — ABNORMAL HIGH (ref 70–99)

## 2022-05-18 LAB — HCV RNA QUANT

## 2022-05-18 LAB — HCV RNA (INTERNATIONAL UNITS)
HCV RNA (International Units): 40400000 IU/mL
HCV log10: 7.606 log10 IU/mL

## 2022-05-18 MED ORDER — CARVEDILOL 3.125 MG PO TABS
3.1250 mg | ORAL_TABLET | Freq: Two times a day (BID) | ORAL | Status: DC
  Administered 2022-05-18 – 2022-05-20 (×4): 3.125 mg via ORAL
  Filled 2022-05-18 (×4): qty 1

## 2022-05-18 NOTE — Progress Notes (Signed)
[facility] Progress Note  Patient: Trevor Brown    YSA:[id number]  DOA: 05/14/2022    Date of Service: the patient was seen and examined on 05/18/2022  Brief hospital course: 31 year old male with past medical history of homelessness, polysubstance abuse, hepatitis C, uncontrolled diabetes mellitus type 1 who presented to the emergency room on 10/24 with left upper extremity swelling and pain.  At time of admission, pt was in DKA.  Work-up revealed cellulitis and abscess of left forearm and patient admitted to the hospitalist service and started on IV antibiotics.  Also treated with IV fluids and insulin drip.  DKA resolved by evening of 10/24.  Orthopedic surgery consulted and took patient for I&D on afternoon of 10/25.  Assessment and Plan: Assessment and Plan: * Cellulitis of left upper arm Status post open I&D of medial left arm abscess.  Cultures noting gram-positive cocci in pairs and gram-negative rods, awaiting identification and sensitivities.  Currently on cefepime and vancomycin.  Infectious disease to see.  Limit narcotic medication for pain given abuse history  Elevated blood pressure reading No previous history of hypertension although echocardiogram in 2017 noted grade 1 diastolic dysfunction.  Echocardiogram unremarkable.  We will try low-dose hydralazine  Diabetes mellitus type 1 (HCC) uncontrolled with hyperglycemia Lantus started postop, and patient had dramatic response to sliding scale, likely indicative of noncompliance.  A1c 11.1 higher than in May.  Appreciate diabetes coordinator.  Decrease sliding scale to sensitive and added 3 times daily NovoLog.  CBGs improved.  Hepatitis C Unclear as to severity, will check quantitative  Polysubstance abuse (HCC) Urine drug screen positive for opiates, but not for cocaine as in the past patient notes currently IV heroin.  Regardless, monitor for withdrawals.  Tobacco abuse Patient declines nicotine patch at this  time  Homelessness Between this and drug use, makes patient extremely difficult to set up follow-up appointment and long-term wound care.  Malnutrition of moderate degree (HCC) BMI of 17.7.  Nutrition to see.        Body mass index is 17.71 kg/m.        Consultants: Orthopedic surgery Infectious disease  Procedures: Status post open I&D left forearm 10/25  Antimicrobials: IV cefepime and vancomycin 10/24-present  Code Status: Full code   Subjective: Continues to complain of -left arm pain Objective: Vital signs were reviewed and unremarkable. Vitals:   05/18/22 0447 05/18/22 0756  BP: (!) 155/84 (!) 141/95  Pulse: 76 76  Resp: 18   Temp: 98.3 F (36.8 C) 98 F (36.7 C)  SpO2: 100% 100%    Intake/Output Summary (Last 24 hours) at 05/18/2022 1537 Last data filed at 05/18/2022 0800 Gross per 24 hour  Intake 300 ml  Output --  Net 300 ml    Filed Weights   05/14/22 2051 05/15/22 1123  Weight: 46.8 kg 46.8 kg   Body mass index is 17.71 kg/m.  Exam:  General: Alert and oriented x3, no acute distress, looks slightly emaciated HEENT: Normocephalic, atraumatic, mucous membranes slightly dry Cardiovascular: Regular rate and rhythm, S1-S2 Respiratory: Clear to auscultation bilaterally Abdomen: Soft, nontender, nondistended, positive bowel sounds Musculoskeletal: No clubbing or cyanosis or pitting edema.  Left upper extremity wrapped Skin: Noted track marks.   Psychiatry: Appropriate, no evidence of psychoses Neurology: No focal deficits  Data Reviewed: CBGs remain stable  Disposition:  Status is: Inpatient Remains inpatient appropriate because: Identification of organism and sensitivities    Anticipated discharge date: 10/30 at 10/31 Family Communication: We will call brother  DVT Prophylaxis: enoxaparin (LOVENOX) injection 40 mg Start: 05/16/22 2200    Author: Annita Brod ,MD 05/18/2022 3:37 PM  To reach On-call, see care teams to  locate the attending and reach out via [email or web address]. Between 7PM-7AM, please contact night-coverage If you still have difficulty reaching the attending provider, please page the [facility] (Director on Call) for [facility] on amion for assistance.

## 2022-05-18 NOTE — Plan of Care (Signed)
Problem: Education: Goal: Ability to describe self-care measures that may prevent or decrease complications (Diabetes Survival Skills Education) will improve 05/18/2022 0312 by Rogelia Rohrer, LPN Outcome: Progressing 05/18/2022 0312 by Rogelia Rohrer, LPN Outcome: Progressing Goal: Individualized Educational Video(s) 05/18/2022 0312 by Rogelia Rohrer, LPN Outcome: Progressing 05/18/2022 0312 by Rogelia Rohrer, LPN Outcome: Progressing   Problem: Coping: Goal: Ability to adjust to condition or change in health will improve 05/18/2022 0312 by Rogelia Rohrer, LPN Outcome: Progressing 05/18/2022 0312 by Rogelia Rohrer, LPN Outcome: Progressing   Problem: Fluid Volume: Goal: Ability to maintain a balanced intake and output will improve 05/18/2022 0312 by Rogelia Rohrer, LPN Outcome: Progressing 05/18/2022 0312 by Rogelia Rohrer, LPN Outcome: Progressing   Problem: Health Behavior/Discharge Planning: Goal: Ability to identify and utilize available resources and services will improve 05/18/2022 0312 by Rogelia Rohrer, LPN Outcome: Progressing 05/18/2022 0312 by Rogelia Rohrer, LPN Outcome: Progressing Goal: Ability to manage health-related needs will improve 05/18/2022 0312 by Rogelia Rohrer, LPN Outcome: Progressing 05/18/2022 0312 by Rogelia Rohrer, LPN Outcome: Progressing   Problem: Metabolic: Goal: Ability to maintain appropriate glucose levels will improve 05/18/2022 0312 by Rogelia Rohrer, LPN Outcome: Progressing 05/18/2022 0312 by Rogelia Rohrer, LPN Outcome: Progressing   Problem: Nutritional: Goal: Maintenance of adequate nutrition will improve 05/18/2022 0312 by Rogelia Rohrer, LPN Outcome: Progressing 05/18/2022 0312 by Rogelia Rohrer, LPN Outcome: Progressing Goal: Progress toward achieving an optimal weight will improve 05/18/2022 0312 by Rogelia Rohrer, LPN Outcome: Progressing 05/18/2022 0312 by  Rogelia Rohrer, LPN Outcome: Progressing   Problem: Skin Integrity: Goal: Risk for impaired skin integrity will decrease 05/18/2022 0312 by Rogelia Rohrer, LPN Outcome: Progressing 05/18/2022 0312 by Rogelia Rohrer, LPN Outcome: Progressing   Problem: Tissue Perfusion: Goal: Adequacy of tissue perfusion will improve 05/18/2022 0312 by Rogelia Rohrer, LPN Outcome: Progressing 05/18/2022 0312 by Rogelia Rohrer, LPN Outcome: Progressing   Problem: Education: Goal: Knowledge of General Education information will improve Description: Including pain rating scale, medication(s)/side effects and non-pharmacologic comfort measures 05/18/2022 0312 by Rogelia Rohrer, LPN Outcome: Progressing 05/18/2022 0312 by Rogelia Rohrer, LPN Outcome: Progressing   Problem: Health Behavior/Discharge Planning: Goal: Ability to manage health-related needs will improve 05/18/2022 0312 by Rogelia Rohrer, LPN Outcome: Progressing 05/18/2022 0312 by Rogelia Rohrer, LPN Outcome: Progressing   Problem: Clinical Measurements: Goal: Ability to maintain clinical measurements within normal limits will improve 05/18/2022 0312 by Rogelia Rohrer, LPN Outcome: Progressing 05/18/2022 0312 by Rogelia Rohrer, LPN Outcome: Progressing Goal: Will remain free from infection 05/18/2022 0312 by Rogelia Rohrer, LPN Outcome: Progressing 05/18/2022 0312 by Rogelia Rohrer, LPN Outcome: Progressing Goal: Diagnostic test results will improve 05/18/2022 0312 by Rogelia Rohrer, LPN Outcome: Progressing 05/18/2022 0312 by Rogelia Rohrer, LPN Outcome: Progressing Goal: Respiratory complications will improve 05/18/2022 0312 by Rogelia Rohrer, LPN Outcome: Progressing 05/18/2022 0312 by Rogelia Rohrer, LPN Outcome: Progressing Goal: Cardiovascular complication will be avoided 05/18/2022 0312 by Rogelia Rohrer, LPN Outcome: Progressing 05/18/2022 0312 by Rogelia Rohrer, LPN Outcome: Progressing   Problem: Activity: Goal: Risk for activity intolerance will decrease 05/18/2022 0312 by Rogelia Rohrer, LPN Outcome: Progressing 05/18/2022 0312 by Rogelia Rohrer, LPN Outcome: Progressing   Problem: Nutrition: Goal: Adequate nutrition will be maintained 05/18/2022 0312 by Rogelia Rohrer, LPN Outcome: Progressing 05/18/2022 0312 by Rogelia Rohrer, LPN Outcome: Progressing  Problem: Coping: Goal: Level of anxiety will decrease 05/18/2022 0312 by Sandi Mealy, LPN Outcome: Progressing 05/18/2022 0312 by Sandi Mealy, LPN Outcome: Progressing   Problem: Elimination: Goal: Will not experience complications related to bowel motility 05/18/2022 0312 by Sandi Mealy, LPN Outcome: Progressing 05/18/2022 0312 by Sandi Mealy, LPN Outcome: Progressing Goal: Will not experience complications related to urinary retention 05/18/2022 0312 by Sandi Mealy, LPN Outcome: Progressing 05/18/2022 0312 by Sandi Mealy, LPN Outcome: Progressing   Problem: Pain Managment: Goal: General experience of comfort will improve 05/18/2022 0312 by Sandi Mealy, LPN Outcome: Progressing 05/18/2022 0312 by Sandi Mealy, LPN Outcome: Progressing   Problem: Safety: Goal: Ability to remain free from injury will improve 05/18/2022 0312 by Sandi Mealy, LPN Outcome: Progressing 05/18/2022 0312 by Sandi Mealy, LPN Outcome: Progressing   Problem: Skin Integrity: Goal: Risk for impaired skin integrity will decrease 05/18/2022 0312 by Sandi Mealy, LPN Outcome: Progressing 05/18/2022 0312 by Sandi Mealy, LPN Outcome: Progressing

## 2022-05-18 NOTE — Plan of Care (Signed)

## 2022-05-18 NOTE — Progress Notes (Signed)
  Subjective:  POD #3 s/p I&D of left upper arm abscess.   Patient reports left arm pain as moderate.  Patient up in bed eating lunch and moving left upper extremity without pain.  Objective:   VITALS:   Vitals:   05/17/22 1450 05/17/22 2025 05/18/22 0447 05/18/22 0756  BP: 132/82 137/89 (!) 155/84 (!) 141/95  Pulse: 95 83 76 76  Resp: 17 15 18    Temp: 98 F (36.7 C) 98.2 F (36.8 C) 98.3 F (36.8 C) 98 F (36.7 C)  TempSrc:  Oral    SpO2: 100% 100% 100% 100%  Weight:      Height:        PHYSICAL EXAM: Left upper arm: Patient is spontaneously moving his left shoulder elbow hand and fingers without pain.  There is no significant swelling of the upper arm or forearm. Neurovascular intact Sensation intact distally Intact pulses distally Incision: dressing C/D/I No cellulitis present Compartment soft  LABS  Results for orders placed or performed during the hospital encounter of 05/14/22 (from the past 24 hour(s))  Glucose, capillary     Status: Abnormal   Collection Time: 05/17/22  9:39 PM  Result Value Ref Range   Glucose-Capillary 174 (H) 70 - 99 mg/dL   Comment 1 Notify RN    Comment 2 Document in Chart   Glucose, capillary     Status: Abnormal   Collection Time: 05/18/22  8:38 AM  Result Value Ref Range   Glucose-Capillary 114 (H) 70 - 99 mg/dL    No results found.  Assessment/Plan: 3 Days Post-Op   Principal Problem:   Cellulitis of left upper arm Active Problems:   Homelessness   Hepatitis C   Malnutrition of moderate degree (HCC)   Tobacco abuse   Diabetes mellitus type 1 (HCC) uncontrolled with hyperglycemia   Polysubstance abuse (HCC)   Elevated blood pressure reading  Cultures showing strep constellatus and strep group F.  Sensitivities are pending.  Continue IV antibiotics as ordered per hospitalist.  Infectious disease consult pending.  Continue strict blood sugar control.    Thornton Park , MD 05/18/2022, 12:34 PM

## 2022-05-18 NOTE — Progress Notes (Deleted)
Patient will transfer to Unit 2A Room 247 as patient requires Cardizem gtt. Report given. Waiting for room to be cleaned.

## 2022-05-19 LAB — CULTURE, BLOOD (ROUTINE X 2)
Culture: NO GROWTH
Culture: NO GROWTH
Special Requests: ADEQUATE

## 2022-05-19 LAB — AEROBIC/ANAEROBIC CULTURE W GRAM STAIN (SURGICAL/DEEP WOUND)

## 2022-05-19 LAB — GLUCOSE, CAPILLARY
Glucose-Capillary: 329 mg/dL — ABNORMAL HIGH (ref 70–99)
Glucose-Capillary: 174 mg/dL — ABNORMAL HIGH (ref 70–99)
Glucose-Capillary: 293 mg/dL — ABNORMAL HIGH (ref 70–99)
Glucose-Capillary: 193 mg/dL — ABNORMAL HIGH (ref 70–99)

## 2022-05-19 MED ORDER — HYDROMORPHONE HCL 1 MG/ML IJ SOLN: 1.0000 mg | Freq: Once | INTRAMUSCULAR | Status: DC

## 2022-05-19 MED ORDER — INSULIN GLARGINE-YFGN 100 UNIT/ML ~~LOC~~ SOLN
18.0000 [IU] | Freq: Two times a day (BID) | SUBCUTANEOUS | Status: DC
  Administered 2022-05-19 – 2022-05-20 (×3): 18 [IU] via SUBCUTANEOUS
  Filled 2022-05-19 (×4): qty 0.18

## 2022-05-19 MED ORDER — AMOXICILLIN-POT CLAVULANATE 875-125 MG PO TABS
1.0000 | ORAL_TABLET | Freq: Two times a day (BID) | ORAL | Status: DC
  Administered 2022-05-19 – 2022-05-20 (×3): 1 via ORAL
  Filled 2022-05-19 (×3): qty 1

## 2022-05-19 MED ORDER — INSULIN ASPART 100 UNIT/ML IJ SOLN
5.0000 [IU] | Freq: Three times a day (TID) | INTRAMUSCULAR | Status: DC
  Administered 2022-05-19 – 2022-05-20 (×4): 5 [IU] via SUBCUTANEOUS
  Filled 2022-05-19 (×3): qty 1

## 2022-05-19 NOTE — Progress Notes (Signed)
  Progress Note   Patient: Trevor Brown OJJ:[id number] DOB: Feb 12, 1991 DOA: 05/14/2022     5 DOS: the patient was seen and examined on 05/19/2022   Brief hospital course: 31 year old male with past medical history of homelessness, polysubstance abuse, hepatitis C, uncontrolled diabetes mellitus type 1 who presented to the emergency room on 10/24 with left upper extremity swelling and pain.  At time of admission, pt was in DKA.  Work-up revealed cellulitis and abscess of left forearm and patient admitted to the hospitalist service and started on IV antibiotics.  Also treated with IV fluids and insulin drip.  DKA resolved by evening of 10/24.  Orthopedic surgery consulted and took patient for I&D on afternoon of 10/25.  Assessment and Plan: * Cellulitis of left upper arm Status post open I&D of medial left arm abscess.  Cultures noting gram-positive cocci in pairs and gram-negative rods, awaiting identification and sensitivities.  Currently on cefepime and vancomycin. --changed to PO Augmentin. -- Dr. Mack Guise will change dressing tomorrow. Okay to discharge tomorrow from his standpoint after dressing changes done. Patient remains afebrile.  Elevated blood pressure reading No previous history of hypertension although echocardiogram in 2017 noted grade 1 diastolic dysfunction.   Echocardiogram unremarkable.   We will try low-dose hydralazine  Diabetes mellitus type 1 ([place]) uncontrolled with hyperglycemia Lantus started postop, and patient had dramatic response to sliding scale, likely indicative of noncompliance.  A1c 11.1 higher than in May.  Appreciate diabetes coordinator.  Decrease sliding scale to sensitive and added 3 times daily NovoLog.  CBGs improved.  Hepatitis C Unclear as to severity, will check quantitative  Polysubstance abuse (HCC) Urine drug screen positive for opiates, but not for cocaine as in the past patient notes currently IV heroin.  Regardless, monitor for  withdrawals.  Tobacco abuse Patient declines nicotine patch at this time  Homelessness Between this and drug use, makes patient extremely difficult to set up follow-up appointment and long-term wound care.  Malnutrition of moderate degree (HCC) BMI of 17.7.  Nutrition to see.    TOC to help with medication from employee pharmacy at discharge.     Subjective: patient laying comfortably watching TV. When asked how he is doing with pain he said okay. Has been asking for IV pain meds on the dot. Does not appear to be in any distress.  Physical Exam: Vitals:   05/18/22 0756 05/18/22 1646 05/19/22 0000 05/19/22 0819  BP: (!) 141/95 133/87 136/89 (!) 137/96  Pulse: 76 83 81 69  Resp:   15 20  Temp: 98 F (36.7 C) 98.5 F (36.9 C) 98.1 F (36.7 C) 98.2 F (36.8 C)  TempSrc:      SpO2: 100% 100% 100% 100%  Weight:      Height:       Left arm dressing present.  Family Communication: none  Disposition: home Status is: Inpatient Remains inpatient appropriate because: dressing change to be done by Dr. Mack Guise tomorrow.  Planned Discharge Destination: Home    Time spent: 35 minutes  Author: Fritzi Mandes, MD 05/19/2022 1:47 PM  For on call review [email or web address].

## 2022-05-19 NOTE — Plan of Care (Signed)

## 2022-05-19 NOTE — Progress Notes (Signed)
  Subjective:  POD #4 s/p open incision and drainage for left arm abscess.   Patient reports left arm pain as mild to moderate.  Patient resting comfortably in bed watching television.  Objective:   VITALS:   Vitals:   05/18/22 1646 05/19/22 0000 05/19/22 0819 05/19/22 1544  BP: 133/87 136/89 (!) 137/96 134/86  Pulse: 83 81 69 89  Resp:  15 20 20   Temp: 98.5 F (36.9 C) 98.1 F (36.7 C) 98.2 F (36.8 C) 98.4 F (36.9 C)  TempSrc:      SpO2: 100% 100% 100% 100%  Weight:      Height:        PHYSICAL EXAM: Left upper extremity Neurovascular intact Sensation intact distally Intact pulses distally Dorsiflexion/Plantar flexion intact Incision: dressing C/D/I No cellulitis present Compartment soft  LABS  Results for orders placed or performed during the hospital encounter of 05/14/22 (from the past 24 hour(s))  Glucose, capillary     Status: Abnormal   Collection Time: 05/18/22  9:44 PM  Result Value Ref Range   Glucose-Capillary 269 (H) 70 - 99 mg/dL  Glucose, capillary     Status: Abnormal   Collection Time: 05/19/22  8:16 AM  Result Value Ref Range   Glucose-Capillary 329 (H) 70 - 99 mg/dL  Glucose, capillary     Status: Abnormal   Collection Time: 05/19/22 11:44 AM  Result Value Ref Range   Glucose-Capillary 174 (H) 70 - 99 mg/dL  Glucose, capillary     Status: Abnormal   Collection Time: 05/19/22  4:15 PM  Result Value Ref Range   Glucose-Capillary 293 (H) 70 - 99 mg/dL    No results found.  Assessment/Plan: 4 Days Post-Op   Principal Problem:   Cellulitis of left upper arm Active Problems:   Homelessness   Hepatitis C   Malnutrition of moderate degree (HCC)   Tobacco abuse   Diabetes mellitus type 1 (HCC) uncontrolled with hyperglycemia   Polysubstance abuse (HCC)   Elevated blood pressure reading  Discussed case with Dr. Posey Pronto (hospitalist) today.  Patient will be considered for discharge tomorrow on oral Augmentin.  Infectious disease consult was  placed last week but has not been completed.  I will change the patient's dressing tomorrow.  Continue IV antibiotics per hospitalist commendations.    Thornton Park , MD 05/19/2022, 8:15 PM

## 2022-05-20 ENCOUNTER — Other Ambulatory Visit: Payer: Self-pay

## 2022-05-20 DIAGNOSIS — E1065 Type 1 diabetes mellitus with hyperglycemia: Secondary | ICD-10-CM

## 2022-05-20 DIAGNOSIS — Z5900 Homelessness unspecified: Secondary | ICD-10-CM

## 2022-05-20 DIAGNOSIS — B1920 Unspecified viral hepatitis C without hepatic coma: Secondary | ICD-10-CM

## 2022-05-20 LAB — AEROBIC/ANAEROBIC CULTURE W GRAM STAIN (SURGICAL/DEEP WOUND)

## 2022-05-20 LAB — GLUCOSE, CAPILLARY
Glucose-Capillary: 71 mg/dL (ref 70–99)
Glucose-Capillary: 119 mg/dL — ABNORMAL HIGH (ref 70–99)
Glucose-Capillary: 310 mg/dL — ABNORMAL HIGH (ref 70–99)
Glucose-Capillary: 144 mg/dL — ABNORMAL HIGH (ref 70–99)

## 2022-05-20 MED ORDER — AMOXICILLIN-POT CLAVULANATE 875-125 MG PO TABS
1.0000 | ORAL_TABLET | Freq: Two times a day (BID) | ORAL | 0 refills | Status: DC
  Filled 2022-05-20: qty 12, 6d supply, fill #0

## 2022-05-20 MED ORDER — OXYCODONE HCL 5 MG PO TABA: 2.5000 mg | ORAL_TABLET | Freq: Three times a day (TID) | ORAL | 0 refills | Status: AC | PRN

## 2022-05-20 MED ORDER — BASAGLAR KWIKPEN 100 UNIT/ML ~~LOC~~ SOPN
20.0000 [IU] | PEN_INJECTOR | Freq: Two times a day (BID) | SUBCUTANEOUS | 11 refills | Status: AC
  Filled 2022-05-20: qty 15, 38d supply, fill #0

## 2022-05-20 MED ORDER — CARVEDILOL 3.125 MG PO TABS
3.1250 mg | ORAL_TABLET | Freq: Two times a day (BID) | ORAL | 2 refills | Status: AC
  Filled 2022-05-20: qty 60, 30d supply, fill #0

## 2022-05-20 MED ORDER — INSULIN SYRINGES (DISPOSABLE) U-100 0.3 ML MISC
0 refills | Status: AC
  Filled 2022-05-20: qty 100, fill #0

## 2022-05-20 MED ORDER — AMOXICILLIN-POT CLAVULANATE 875-125 MG PO TABS
1.0000 | ORAL_TABLET | Freq: Two times a day (BID) | ORAL | 0 refills | Status: AC
  Filled 2022-05-20: qty 28, 14d supply, fill #0

## 2022-05-20 MED ORDER — INSULIN PEN NEEDLE 32G X 4 MM MISC
11 refills | Status: AC
  Filled 2022-05-20: qty 100, 20d supply, fill #0

## 2022-05-20 MED ORDER — INSULIN LISPRO (1 UNIT DIAL) 100 UNIT/ML (KWIKPEN)
5.0000 [IU] | PEN_INJECTOR | Freq: Three times a day (TID) | SUBCUTANEOUS | 3 refills | Status: AC
  Filled 2022-05-20: qty 15, 50d supply, fill #0

## 2022-05-20 NOTE — Inpatient Diabetes Management (Signed)
Inpatient Diabetes Program Recommendations  AACE/ADA: New Consensus Statement on Inpatient Glycemic Control (2015)  Target Ranges:  Prepandial:   less than 140 mg/dL      Peak postprandial:   less than 180 mg/dL (1-2 hours)      Critically ill patients:  140 - 180 mg/dL   Lab Results  Component Value Date   GLUCAP 310 (H) 05/20/2022   HGBA1C 11.1 (H) 05/15/2022    Review of Glycemic Control  Latest Reference Range & Units 05/19/22 08:16 05/19/22 11:44 05/19/22 16:15 05/19/22 20:42 05/20/22 07:54  Glucose-Capillary 70 - 99 mg/dL 329 (H) 174 (H) 293 (H) 193 (H) 310 (H)  (H): Data is abnormally high  Diabetes history: DM1 (does NOT make any insulin; requires basal, correction, and carb coverage insulin) Outpatient Diabetes medications: Lantus 4-10 units daily, Novolog 70/30 30 units BID Current orders for Inpatient glycemic control: Semglee 18 units BID, Novolog 0-9 units TID with meals, Novolog 0-5 units QHS, Novolog 5 units TID  Inpatient Diabetes Program Recommendations:    Semglee 25 units BID (was on 30 units BID during last hospitalization) & Change diet to Carb modified.  Will continue to follow while inpatient.  Thank you, Reche Dixon, MSN, [facility] Coordinator Inpatient Diabetes Program [phone] (team pager from 8a-5p)

## 2022-05-20 NOTE — Consult Note (Addendum)
NAME: Trevor Brown  DOB: [birth year]  MRN: [id number]  Date/Time: 05/20/2022 2:37 PM  REQUESTING PROVIDER: Dr.Krasniski Subjective:  REASON FOR CONSULT: left arm abscesses ? Trevor Brown is a 31 y.o. with a history of Type 1 DM IVDA, HEPC , h/o DKA , homelessness presented to the hospital  because of painful swelling left arm with pus draining from wounds Hge mainlines heroin In the ED  05/14/22  BP 134/89  Temp 99.6 F (37.6 C)  Pulse Rate 96  Resp 17  SpO2 100 %  Note: Showing the most recent values for these dates. There are additional values that can be seen in Synopsis.  Latest Reference Range & Units 05/14/22  WBC 4.0 - 10.5 K/uL 14.5 (H)  Hemoglobin 13.0 - 17.0 g/dL 40.9 (L)  HCT 81.1 - 91.4 % 36.7 (L)  Platelets 150 - 400 K/uL 279  Creatinine 0.61 - 1.24 mg/dL 7.82   HE was also found to have high blood glucose as he was non compliant with insulin. He is homeless CT of the arm revealed mid arm abscess of 3X6X9 cm .HE was started on broad spectrum antibiotic  HE was taken to OR by Dr.Krasinksi for I/D and debridement. As culture show streptococcus , anaerobes he is currently on Augmentin and I am seeing the patient for further recommendation Past Medical History:  Diagnosis Date   Heart attack (HCC)    Pt claims he had heart attack a year ago ( 2015)- and was admitted in [facility] for that, but not given any meds or angiogram, on review of chart- I could not find any details like that.   Hepatitis C, acute may 2016   Heroin abuse (HCC)    History of noncompliance with medical treatment    Hypertension    Type 1 diabetes [facility])     Past Surgical History:  Procedure Laterality Date   INCISION AND DRAINAGE Left 05/15/2022   Procedure: INCISION AND DRAINAGE;  Surgeon: Juanell Fairly, MD;  Location: [facility] ORS;  Service: Orthopedics;  Laterality: Left;   INCISION AND DRAINAGE ABSCESS Left 03/04/2020   Procedure: INCISION AND DRAINAGE ABSCESS;  Surgeon: Christena Flake, MD;   Location: [facility] ORS;  Service: Orthopedics;  Laterality: Left;   NO PAST SURGERIES     none      Social History   Socioeconomic History   Marital status: Single    Spouse name: Not on file   Number of children: Not on file   Years of education: Not on file   Highest education level: Not on file  Occupational History   Occupation: tree cutter  Tobacco Use   Smoking status: Every Day    Packs/day: 2.00    Types: Cigarettes   Smokeless tobacco: Never  Vaping Use   Vaping Use: Never used  Substance and Sexual Activity   Alcohol use: No    Alcohol/week: 0.0 standard drinks of alcohol   Drug use: Not Currently    Types: IV, Heroin, Marijuana    Comment: heroin-    Sexual activity: Yes  Other Topics Concern   Not on file  Social History Narrative   Not on file   Social Determinants of Health   Financial Resource Strain: High Risk (04/01/2019)   Overall Financial Resource Strain (CARDIA)    Difficulty of Paying Living Expenses: Hard  Food Insecurity: No Food Insecurity (04/01/2019)   Hunger Vital Sign    Worried About Running Out of Food in the Last Year: Never true  Ran Out of Food in the Last Year: Never true  Transportation Needs: No Transportation Needs (12/28/2020)   PRAPARE - Administrator, Civil Service (Medical): No    Lack of Transportation (Non-Medical): No  Physical Activity: Sufficiently Active (04/01/2019)   Exercise Vital Sign    Days of Exercise per Week: 7 days    Minutes of Exercise per Session: 60 min  Stress: Not on file  Social Connections: Moderately Isolated (04/01/2019)   Social Connection and Isolation Panel [NHANES]    Frequency of Communication with Friends and Family: More than three times a week    Frequency of Social Gatherings with Friends and Family: More than three times a week    Attends Religious Services: Never    Database administrator or Organizations: No    Attends Banker Meetings: Never    Marital Status:  Living with partner  Intimate Partner Violence: Not At Risk (04/01/2019)   Humiliation, Afraid, Rape, and Kick questionnaire    Fear of Current or Ex-Partner: No    Emotionally Abused: No    Physically Abused: No    Sexually Abused: No    Family History  Problem Relation Age of Onset   Cirrhosis Mother    Diabetes Mellitus II Maternal Grandmother    Diabetes Mellitus II Maternal Grandfather    CAD Father    Allergies  Allergen Reactions   Bee Venom Anaphylaxis   Ibuprofen Itching, Rash and Other (See Comments)    Other reaction(s): Other (See Comments) Stomach upset Reaction:  GI upset    Tramadol Hives and Swelling   Vancomycin Rash and Other (See Comments)    Reaction:  Red man's syndrome    I? Current Facility-Administered Medications  Medication Dose Route Frequency Provider Last Rate Last Admin   acetaminophen (TYLENOL) tablet 650 mg  650 mg Oral Q6H PRN Juanell Fairly, MD   650 mg at 05/19/22 2209   amoxicillin-clavulanate (AUGMENTIN) 875-125 MG per tablet 1 tablet  1 tablet Oral Q12H Enedina Finner, MD   1 tablet at 05/20/22 0845   carvedilol (COREG) tablet 3.125 mg  3.125 mg Oral BID WC Hollice Espy, MD   3.125 mg at 05/20/22 0845   enoxaparin (LOVENOX) injection 40 mg  40 mg Subcutaneous Q24H Juanell Fairly, MD       hydrALAZINE (APRESOLINE) tablet 10 mg  10 mg Oral Q6H PRN Hollice Espy, MD       HYDROmorphone (DILAUDID) injection 1 mg  1 mg Intravenous Once Enedina Finner, MD       insulin aspart (novoLOG) injection 0-5 Units  0-5 Units Subcutaneous QHS Hollice Espy, MD   3 Units at 05/18/22 2150   insulin aspart (novoLOG) injection 0-9 Units  0-9 Units Subcutaneous TID WC Hollice Espy, MD   1 Units at 05/20/22 1253   insulin aspart (novoLOG) injection 5 Units  5 Units Subcutaneous TID WC Enedina Finner, MD   5 Units at 05/20/22 1254   insulin glargine-yfgn (SEMGLEE) injection 18 Units  18 Units Subcutaneous BID Enedina Finner, MD   18 Units at 05/20/22  1031   naloxone (NARCAN) injection 0.4 mg  0.4 mg Intravenous PRN Juanell Fairly, MD       ondansetron [facility]) tablet 4 mg  4 mg Oral Q6H PRN Juanell Fairly, MD       Or   ondansetron [facility]) injection 4 mg  4 mg Intravenous Q6H PRN Juanell Fairly, MD   4 mg  at 05/19/22 0326   oxyCODONE (Oxy IR/ROXICODONE) immediate release tablet 5-10 mg  5-10 mg Oral Q4H PRN Juanell Fairly, MD   10 mg at 05/20/22 1255     Abtx:  Anti-infectives (From admission, onward)    Start     Dose/Rate Route Frequency Ordered Stop   05/19/22 1000  amoxicillin-clavulanate (AUGMENTIN) 875-125 MG per tablet 1 tablet        1 tablet Oral Every 12 hours 05/19/22 0816 05/25/22 0959   05/17/22 0700  vancomycin (VANCOREADY) IVPB 1250 mg/250 mL  Status:  Discontinued        1,250 mg 166.7 mL/hr over 90 Minutes Intravenous Every 24 hours 05/16/22 1057 05/16/22 1058   05/16/22 1830  vancomycin (VANCOREADY) IVPB 1250 mg/250 mL  Status:  Discontinued        1,250 mg 166.7 mL/hr over 90 Minutes Intravenous Every 24 hours 05/16/22 1058 05/19/22 0815   05/15/22 0600  vancomycin (VANCOREADY) IVPB 500 mg/100 mL  Status:  Discontinued        500 mg 100 mL/hr over 60 Minutes Intravenous Every 12 hours 05/14/22 2115 05/16/22 1057   05/14/22 2200  ceFEPIme (MAXIPIME) 2 g in sodium chloride 0.9 % 100 mL IVPB  Status:  Discontinued        2 g 200 mL/hr over 30 Minutes Intravenous Every 8 hours 05/14/22 2112 05/19/22 0815   05/14/22 1530  vancomycin (VANCOCIN) IVPB 1000 mg/200 mL premix        1,000 mg 200 mL/hr over 60 Minutes Intravenous  Once 05/14/22 1529 05/14/22 1921       REVIEW OF SYSTEMS:  Const: negative fever, negative chills, +weight loss Eyes: negative diplopia or visual changes, negative eye pain ENT: negative coryza, negative sore throat Resp: negative cough, hemoptysis, dyspnea Cards: negative for chest pain, palpitations, lower extremity edema GU: negative for frequency, dysuria and hematuria GI:  Negative for abdominal pain, diarrhea, bleeding, constipation Skin: negative for rash and pruritus Heme: negative for easy bruising and gum/nose bleeding MS: as above Neurolo:negative for headaches, dizziness, vertigo, memory problems  Psych: anxiety, depression  Endocrine: , diabetes Allergy/Immunology-as above Objective:  VITALS:  BP (!) 138/93 (BP Location: Right Arm)   Pulse 77   Temp 98.3 F (36.8 C)   Resp 17   Ht 5\' 4"  (1.626 m)   Wt 46.8 kg   SpO2 100%   BMI 17.71 kg/m   PHYSICAL EXAM:  General: Alert, cooperative,emaciated, pale Head: Normocephalic, without obvious abnormality, atraumatic. Eyes: Conjunctivae clear, anicteric sclerae. Pupils are equal ENT Nares normal. No drainage or sinus tenderness. Lips, mucosa, and tongue normal. No Thrush edentulous Neck: Supple, symmetrical, no adenopathy, thyroid: non tender no carotid bruit and no JVD. Back: No CVA tenderness. Lungs: Clear to auscultation bilaterally. No Wheezing or Rhonchi. No rales. Heart: Regular rate and rhythm, no murmur, rub or gallop. Abdomen: Soft, non-tender,not distended. Bowel sounds normal. No masses Extremities: left arm Pre surgery       Post surgery  Edema much improved Erythema resolved Surgical site And debrided wounds   Skin: No rashes or lesions. Or bruising Lymph: Cervical, supraclavicular normal. Neurologic: Grossly non-focal Pertinent Labs Lab Results CBC    Component Value Date/Time   WBC 7.0 05/17/2022 0424   RBC 3.05 (L) 05/17/2022 0424   HGB 9.1 (L) 05/17/2022 0424   HGB 13.5 03/03/2019 1050   HCT 27.1 (L) 05/17/2022 0424   HCT 41.2 03/03/2019 1050   PLT 246 05/17/2022 0424   PLT 285 03/03/2019  1050   MCV 88.9 05/17/2022 0424   MCV 84 03/03/2019 1050   MCV 91 04/06/2014 0413   MCH 29.8 05/17/2022 0424   MCHC 33.6 05/17/2022 0424   RDW 12.6 05/17/2022 0424   RDW 14.0 03/03/2019 1050   RDW 12.7 04/06/2014 0413   LYMPHSABS 1.1 05/14/2022 1610   LYMPHSABS  2.2 03/03/2019 1050   LYMPHSABS 2.5 04/06/2014 0413   MONOABS 0.9 05/14/2022 1610   MONOABS 1.2 (H) 04/06/2014 0413   EOSABS 0.0 05/14/2022 1610   EOSABS 0.2 03/03/2019 1050   EOSABS 0.1 04/06/2014 0413   BASOSABS 0.0 05/14/2022 1610   BASOSABS 0.1 03/03/2019 1050   BASOSABS 0.0 04/06/2014 0413       Latest Ref Rng & Units 05/17/2022    4:24 AM 05/16/2022    7:22 AM 05/16/2022   12:03 AM  CMP  Glucose 70 - 99 mg/dL 235  98  485   BUN 6 - 20 mg/dL 19  19    Creatinine 0.61 - 1.24 mg/dL 0.78  0.85    Sodium 135 - 145 mmol/L 134  133    Potassium 3.5 - 5.1 mmol/L 4.1  3.3    Chloride 98 - 111 mmol/L 104  99    CO2 22 - 32 mmol/L 24  28    Calcium 8.9 - 10.3 mg/dL 8.2  7.9        Microbiology: Recent Results (from the past 240 hour(s))  Culture, blood (routine x 2)     Status: None   Collection Time: 05/14/22  4:10 PM   Specimen: BLOOD  Result Value Ref Range Status   Specimen Description BLOOD BLOOD RIGHT FOREARM  Final   Special Requests   Final    BOTTLES DRAWN AEROBIC AND ANAEROBIC Blood Culture adequate volume   Culture   Final    NO GROWTH 5 DAYS Performed at [facility], [address] [place], [place] [address]    Report Status 05/19/2022 FINAL  Final  Culture, blood (routine x 2)     Status: None   Collection Time: 05/14/22  4:14 PM   Specimen: BLOOD  Result Value Ref Range Status   Specimen Description BLOOD BLOOD RIGHT HAND  Final   Special Requests   Final    BOTTLES DRAWN AEROBIC ONLY Blood Culture results may not be optimal due to an inadequate volume of blood received in culture bottles   Culture   Final    NO GROWTH 5 DAYS Performed at [facility], [address], [place], [place] [address]    Report Status 05/19/2022 FINAL  Final  Aerobic/Anaerobic Culture w Gram Stain (surgical/deep wound)     Status: None   Collection Time: 05/15/22  1:30 PM   Specimen: PATH Other; Tissue  Result Value Ref Range Status   Specimen  Description   Final    ABSCESS Performed at [facility], [address], [place], [place] [address]    Special Requests   Final    LEFT UPPER ARM Performed at [facility], [address] [place], [place] [address]    Gram Stain   Final    ABUNDANT WBC PRESENT,BOTH PMN AND MONONUCLEAR ABUNDANT GRAM POSITIVE COCCI IN PAIRS ABUNDANT GRAM NEGATIVE RODS    Culture   Final    ABUNDANT STREPTOCOCCUS GROUP C ABUNDANT PREVOTELLA DENTICOLA BETA LACTAMASE POSITIVE Performed at [facility], [place] [address], [place], [place] [address]    Report Status 05/18/2022 FINAL  Final  Aerobic/Anaerobic Culture w Gram Stain (surgical/deep wound)     Status: None   Collection Time: 05/15/22  1:37 PM   Specimen: PATH Other; Tissue  Result Value Ref Range Status   Specimen Description   Final    ABSCESS Performed at [facility], [address] [place], [place] [address]    Special Requests   Final    LEFT UPPER ARM ABSCESS Performed at [facility], [address] [place], [place] [address]    Gram Stain   Final    FEW WBC PRESENT,BOTH PMN AND MONONUCLEAR MODERATE GRAM POSITIVE COCCI IN PAIRS FEW GRAM NEGATIVE RODS Performed at [facility], [address] [place], [place] [address]    Culture   Final    ABUNDANT STREPTOCOCCUS CONSTELLATUS ABUNDANT STREPTOCOCCUS GROUP C Beta hemolytic streptococci are predictably susceptible to penicillin and other beta lactams. Susceptibility testing not routinely performed. MIXED ANAEROBIC FLORA PRESENT.  CALL LAB IF FURTHER IID REQUIRED.    Report Status 05/19/2022 FINAL  Final   Organism ID, Bacteria STREPTOCOCCUS CONSTELLATUS  Final      Susceptibility   Streptococcus constellatus - MIC*    PENICILLIN <=0.06 SENSITIVE Sensitive     CEFTRIAXONE 0.5 SENSITIVE Sensitive     ERYTHROMYCIN 2 RESISTANT Resistant     LEVOFLOXACIN <=0.25 SENSITIVE Sensitive     VANCOMYCIN 0.5 SENSITIVE Sensitive     *  ABUNDANT STREPTOCOCCUS CONSTELLATUS  Aerobic/Anaerobic Culture w Gram Stain (surgical/deep wound)     Status: None   Collection Time: 05/15/22  1:44 PM   Specimen: PATH Other; Tissue  Result Value Ref Range Status   Specimen Description   Final    ABSCESS Performed at [facility], [address] [place], [place] [address]    Special Requests   Final    ANTERIOR ARM ABSCESS Performed at [facility], [address] [place], [place] [address]    Gram Stain   Final    ABUNDANT WBC PRESENT,BOTH PMN AND MONONUCLEAR ABUNDANT GRAM POSITIVE COCCI IN PAIRS ABUNDANT GRAM NEGATIVE RODS    Culture   Final    ABUNDANT STREPTOCOCCUS CONSTELLATUS ABUNDANT STREPTOCOCCUS GROUP C MIXED ANAEROBIC FLORA PRESENT.  CALL LAB IF FURTHER IID REQUIRED. Beta hemolytic streptococci are predictably susceptible to penicillin and other beta lactams. Susceptibility testing not routinely performed. Performed at [facility], [address] [place], [place] [address]    Report Status 05/19/2022 FINAL  Final  Aerobic/Anaerobic Culture w Gram Stain (surgical/deep wound)     Status: None   Collection Time: 05/15/22  1:49 PM   Specimen: PATH Other; Tissue  Result Value Ref Range Status   Specimen Description   Final    ABSCESS Performed at [facility], [address], [place], [place] [address]    Special Requests   Final    LEFT UPPER ARM ABSCESS Performed at [facility], [address] [place], [place] [address]    Gram Stain   Final    FEW WBC PRESENT,BOTH PMN AND MONONUCLEAR ABUNDANT GRAM NEGATIVE RODS ABUNDANT GRAM POSITIVE COCCI IN CLUSTERS    Culture   Final    ABUNDANT STREPTOCOCCUS CONSTELLATUS ABUNDANT STREPTOCOCCUS GROUP C ABUNDANT PREVOTELLA DENTICOLA BETA LACTAMASE POSITIVE CALL MICROBIOLOGY LAB IF SENSITIVITIES ARE REQUIRED. Performed at [facility], [address], [place], [place] [address]    Report Status 05/20/2022 FINAL   Final    IMAGING RESULTS: CT left arm reviewed- 3x6x9 cm  abscess  I have personally reviewed the films ? Impression/Recommendation Left  arm abscess involving soft tissue - s/p I/D  This is secondary to IVDA Polymicrobial infection with streptococcus and anerobes- afetr getting IV vanco/cefepime he is now on Po augmentin- will need for 2 more weeks- please get antibiotics  from Medication management  Will follow up with Dr.Krasinski  Type I DM- poorly controlled dut to non compliance because of homelessness/no insurance  Homelessness  [facility]- pt will need treatment as OP Will give follow up appt HIV neg  Emaciation- malnourished  Anemia  Pt seen with Dr.Krasinksi- discussed the management with the patient  ? ? ___________________________________________________ Discussed with patient, requesting provider Note:  This document was prepared using Dragon voice recognition software and may include unintentional dictation errors.

## 2022-05-20 NOTE — Progress Notes (Signed)
Subjective:  POD #5 s/p incision and drainage of left upper arm abscess.   Patient reports left arm pain as moderate.  I saw the patient today with Dr. Joylene Draft from infectious disease.  I personally change his dressing today.  Objective:   VITALS:   Vitals:   05/19/22 0819 05/19/22 1544 05/19/22 2340 05/20/22 0803  BP: (!) 137/96 134/86 129/78 (!) 138/93  Pulse: 69 89 85 77  Resp: 20 20 17 17   Temp: 98.2 F (36.8 C) 98.4 F (36.9 C) 98.1 F (36.7 C) 98.3 F (36.8 C)  TempSrc:      SpO2: 100% 100% 100% 100%  Weight:      Height:        PHYSICAL EXAM: Right arm: Patient has no swelling or erythema.  The incision appears well-healed.  The distal wound at the inferior extent of the incision appears to be healing well.  The anterior proximal wound has expanded but has healthy skin surrounding it and there is healthy granulation tissue in the bed.  There is no drainage from this wound.  There is no drainage from the incision or the distal wound.  Patient has mild amount of serosanguineous the proximal posterior wound.  His arm and forearm compartments are soft and compressible.  He can flex and extend all 5 digits of the left hand.  He has intact sensation light touch throughout the left upper extremity and his fingers are well-perfused.  He has a palpable radial pulse.   LABS  Results for orders placed or performed during the hospital encounter of 05/14/22 (from the past 24 hour(s))  Glucose, capillary     Status: Abnormal   Collection Time: 05/19/22  4:15 PM  Result Value Ref Range   Glucose-Capillary 293 (H) 70 - 99 mg/dL  Glucose, capillary     Status: Abnormal   Collection Time: 05/19/22  8:42 PM  Result Value Ref Range   Glucose-Capillary 193 (H) 70 - 99 mg/dL   Comment 1 Notify RN   Glucose, capillary     Status: Abnormal   Collection Time: 05/20/22  7:54 AM  Result Value Ref Range   Glucose-Capillary 310 (H) 70 - 99 mg/dL  Glucose, capillary     Status: Abnormal    Collection Time: 05/20/22 11:56 AM  Result Value Ref Range   Glucose-Capillary 144 (H) 70 - 99 mg/dL    No results found.  Assessment/Plan: 5 Days Post-Op   Principal Problem:   Cellulitis of left upper arm Active Problems:   Homelessness   Hepatitis C   Malnutrition of moderate degree (HCC)   Tobacco abuse   Diabetes mellitus type 1 (HCC) uncontrolled with hyperglycemia   Polysubstance abuse (HCC)   Elevated blood pressure reading  Patient's arm is healing well.  There is no evidence of active infection that require surgical intervention at this time.  Patient will be discharged today by the hospitalist service.  Patient had his dressing changed today and may leave his dressing on until his follow-up with me next Monday.  Patient will be discharged on Augmentin 875 p.o. twice daily.  Patient was informed that my office is open every day until 9 PM for walk-ins if he needs a dressing change before next week.  Patient should present to my office or the ER if he has increased drainage, swelling, redness or fevers.  I reiterated to the patient the importance of taking his antibiotics to prevent recurrence of his infection.  Hospital will provide his antibiotic  before he leaves.  Patient's glucose control is still not yet optimized and he will need to follow-up with a medical provider for further evaluation and management.  I will defer to the hospitalist regarding diabetic medications upon discharge.  Patient will follow-up at [facility] in [place] with me next Monday.  Call [phone] to schedule an appointment.    Thornton Park , MD 05/20/2022, 12:46 PM

## 2022-05-20 NOTE — Plan of Care (Signed)
  Problem: Metabolic: Goal: Ability to maintain appropriate glucose levels will improve Outcome: Progressing   Problem: Nutritional: Goal: Maintenance of adequate nutrition will improve Outcome: Progressing   Problem: Skin Integrity: Goal: Risk for impaired skin integrity will decrease Outcome: Progressing   Problem: Clinical Measurements: Goal: Will remain free from infection Outcome: Progressing   Problem: Coping: Goal: Level of anxiety will decrease Outcome: Progressing   Problem: Pain Managment: Goal: General experience of comfort will improve Outcome: Progressing

## 2022-05-21 ENCOUNTER — Other Ambulatory Visit: Payer: Self-pay

## 2022-05-21 NOTE — Discharge Summary (Signed)
Physician Discharge Summary   Patient: Trevor Brown MRN: [id number] DOB: 1991-04-01  Admit date:     05/14/2022  Discharge date: 05/20/2022  Discharge Physician: Annita Brod   PCP: Pcp, No   Recommendations at discharge:   New medication: Oxy IR 2.5 mg every 8 hours as needed for pain.  Patient given 6 pills total. New medication: Augmentin 875 mg p.o. twice daily x7 days New medication: Coreg 3.125 p.o. twice daily Patient will follow-up with orthopedic surgery in 1 week Medication change: Glargine insulin decreased from 30 units twice a day to 20 units twice a day Patient given prescription for lispro insulin plus needles and syringes. Please note that the above medications were prescribed to patient from the outpatient [facility].  Plan was for medications to be delivered to the patient at his bedside before discharge.  However, following discharge, patient left before medications were delivered.  It is unclear if he went to the pharmacy to pick up these medications.  Discharge Diagnoses: Principal Problem:   Cellulitis of left upper arm Active Problems:   Elevated blood pressure reading   Diabetes mellitus type 1 (HCC) uncontrolled with hyperglycemia   Hepatitis C   Polysubstance abuse (HCC)   Tobacco abuse   Homelessness   Malnutrition of moderate degree (HCC)  Resolved Problems:   * No resolved hospital problems. *  Hospital Course: 31 year old male with past medical history of homelessness, polysubstance abuse, hepatitis C, uncontrolled diabetes mellitus type 1 who presented to the emergency room on 10/24 with left upper extremity swelling and pain.  At time of admission, pt was in DKA.  Work-up revealed cellulitis and abscess of left forearm and patient admitted to the hospitalist service and started on IV antibiotics.  Also treated with IV fluids and insulin drip.  DKA resolved by evening of 10/24.  Orthopedic surgery consulted and took patient for  I&D on afternoon of 10/25.  Assessment and Plan: * Cellulitis of left upper arm Status post open I&D of medial left arm abscess.  Cultures noting gram-positive cocci in pairs and gram-negative rods, awaiting identification and sensitivities.  Currently on cefepime and vancomycin.  --changed to PO Augmentin since wound culture growing poly microbial organism. Discussed with pharmacy.  As above, unclear if patient picked up his antibiotics.  Attempted to call patient left message to clarify.  Have not received call back.  Patient is to follow-up with orthopedic surgery in 1 week's time for wound check  Elevated blood pressure reading No previous history of hypertension although echocardiogram in 2017 noted grade 1 diastolic dysfunction.   Echocardiogram unremarkable.  Responded well to Coreg.  Prescription given, the patient did not pick up medication  Diabetes mellitus type 1 ([place]) uncontrolled with hyperglycemia Lantus started postop, and patient had dramatic response to sliding scale, likely indicative of noncompliance.  A1c 11.1 higher than in May.  Appreciate diabetes coordinator.  Decrease sliding scale to sensitive and added 3 times daily NovoLog.  CBGs improved.  Discharge patient on consistent Lantus 20 units twice daily, however patient did not pick up medication  Hepatitis C Unclear as to severity, will check quantitative  Polysubstance abuse ([place]) Urine drug screen positive for opiates, but not for cocaine as in the past patient notes currently IV heroin.  Regardless, monitor for withdrawals.  Tobacco abuse Patient declines nicotine patch at this time  Homelessness Between this and drug use, makes patient extremely difficult to set up follow-up appointment and long-term wound care.  Malnutrition  of moderate degree (HCC) BMI of 17.7.  Nutrition to see.         Pain control - [employer] Controlled Substance Reporting System database was reviewed. and patient was  instructed, not to drive, operate heavy machinery, perform activities at heights, swimming or participation in water activities or provide baby-sitting services while on Pain, Sleep and Anxiety Medications; until their outpatient Physician has advised to do so again. Also recommended to not to take more than prescribed Pain, Sleep and Anxiety Medications.  Consultants: Orthopedic surgery Procedures performed: Status post I&D of left forearm on 10/25 Disposition: Home Diet recommendation:  Discharge Diet Orders (From admission, onward)     Start     Ordered   05/20/22 0000  Diet - low sodium heart healthy        05/20/22 1451           Carb modified diet DISCHARGE MEDICATION: Allergies as of 05/20/2022       Reactions   Bee Venom Anaphylaxis   Ibuprofen Itching, Rash, Other (See Comments)   Other reaction(s): Other (See Comments) Stomach upset Reaction:  GI upset    Tramadol Hives, Swelling   Vancomycin Rash, Other (See Comments)   Reaction:  Red man's syndrome         Medication List     STOP taking these medications    FeroSul 325 (65 FE) MG tablet Generic drug: ferrous sulfate   gabapentin 400 MG capsule Commonly known as: NEURONTIN   pantoprazole 40 MG tablet Commonly known as: Protonix       TAKE these medications    amoxicillin-clavulanate 875-125 MG tablet Commonly known as: AUGMENTIN Take 1 tablet by mouth every 12 (twelve) hours.   Basaglar KwikPen 100 UNIT/ML Inject 20 Units into the skin 2 (two) times daily. What changed: how much to take   carvedilol 3.125 MG tablet Commonly known as: COREG Take 1 tablet (3.125 mg total) by mouth 2 (two) times daily with a meal.   FREESTYLE LITE test strip Generic drug: glucose blood To be used as directed for checking her blood sugar   Rightest GS550 Blood Glucose test strip Generic drug: glucose blood USE AS DIRECTED UP TO 4 TIMES DAILY.   HumaLOG KwikPen 100 UNIT/ML KwikPen Generic drug: insulin  lispro Inject 5-10 Units into the skin 3 (three) times daily.   Insulin Syringes (Disposable) U-100 0.3 ML Misc Use as directed   OxyCODONE HCl (Abuse Deter) 5 MG Taba Commonly known as: OXAYDO Take 2.5 mg by mouth every 8 (eight) hours as needed.   Rightest GL300 Lancets Misc USE AS DIRECTED UP TO 4 TIMES DAILY.   Rightest GM550 Blood Glucose w/Device Kit USE AS DIRECTED UP TO 4 TIMES DAILY.   Unifine Pentips 32G X 4 MM Misc Generic drug: Insulin Pen Needle USE AS DIRECTED WITH INSULIN PENS.        Discharge Exam: Filed Weights   05/14/22 2051 05/15/22 1123  Weight: 46.8 kg 46.8 kg   General: Alert and oriented x3, no acute distress Cardiovascular: Regular rate and rhythm, S1-S2 Lungs: Clear to auscultation bilaterally  Condition at discharge: Improving however, if patient did not pick up his medications, his condition will worsen  The results of significant diagnostics from this hospitalization (including imaging, microbiology, ancillary and laboratory) are listed below for reference.   Imaging Studies: ECHOCARDIOGRAM COMPLETE  Result Date: 05/16/2022    ECHOCARDIOGRAM REPORT   Patient Name:   Trevor Brown Date of Exam: 05/15/2022 Medical  Rec #:  [id number]     Height:       64.0 in Accession #:    [id number]    Weight:       103.2 lb Date of Birth:  Oct 30, 1990     BSA:          1.477 m Patient Age:    31 years      BP:           123/78 mmHg Patient Gender: M             HR:           85 bpm. Exam Location:  [facility] Procedure: 2D Echo, Cardiac Doppler and Color Doppler Indications:     W29.56 Acute Diastolic CHF  History:         Patient has prior history of Echocardiogram examinations, most                  recent 12/13/2015. Risk Factors:Hypertension, Diabetes and                  Substance abuse.  Sonographer:     Cresenciano Lick RDCS Referring Phys:  [facility] Diagnosing Phys: Ida Rogue MD IMPRESSIONS  1. Left ventricular ejection fraction, by  estimation, is 60 to 65%. The left ventricle has normal function. The left ventricle has no regional wall motion abnormalities. Left ventricular diastolic parameters were normal.  2. Right ventricular systolic function is normal. The right ventricular size is normal. There is normal pulmonary artery systolic pressure. The estimated right ventricular systolic pressure is 21.3 mmHg.  3. The mitral valve is normal in structure. Mild mitral valve regurgitation. No evidence of mitral stenosis.  4. The aortic valve has an indeterminant number of cusps. Aortic valve regurgitation is not visualized. No aortic stenosis is present.  5. The inferior vena cava is normal in size with greater than 50% respiratory variability, suggesting right atrial pressure of 3 mmHg. FINDINGS  Left Ventricle: Left ventricular ejection fraction, by estimation, is 60 to 65%. The left ventricle has normal function. The left ventricle has no regional wall motion abnormalities. The left ventricular internal cavity size was normal in size. There is  no left ventricular hypertrophy. Left ventricular diastolic parameters were normal. Right Ventricle: The right ventricular size is normal. No increase in right ventricular wall thickness. Right ventricular systolic function is normal. There is normal pulmonary artery systolic pressure. The tricuspid regurgitant velocity is 2.47 m/s, and  with an assumed right atrial pressure of 5 mmHg, the estimated right ventricular systolic pressure is 08.6 mmHg. Left Atrium: Left atrial size was normal in size. Right Atrium: Right atrial size was normal in size. Pericardium: There is no evidence of pericardial effusion. Mitral Valve: The mitral valve is normal in structure. Mild mitral valve regurgitation. No evidence of mitral valve stenosis. Tricuspid Valve: The tricuspid valve is normal in structure. Tricuspid valve regurgitation is mild . No evidence of tricuspid stenosis. Aortic Valve: The aortic valve has an  indeterminant number of cusps. Aortic valve regurgitation is not visualized. No aortic stenosis is present. Pulmonic Valve: The pulmonic valve was normal in structure. Pulmonic valve regurgitation is not visualized. No evidence of pulmonic stenosis. Aorta: The aortic root is normal in size and structure. Venous: The inferior vena cava is normal in size with greater than 50% respiratory variability, suggesting right atrial pressure of 3 mmHg. IAS/Shunts: No atrial level shunt detected by color flow Doppler.  LEFT VENTRICLE PLAX  2D LVIDd:         4.00 cm   Diastology LVIDs:         2.50 cm   LV e' medial:    12.85 cm/s LV PW:         0.80 cm   LV E/e' medial:  6.5 LV IVS:        0.70 cm   LV e' lateral:   15.85 cm/s LVOT diam:     1.90 cm   LV E/e' lateral: 5.3 LV SV:         57 LV SV Index:   38 LVOT Area:     2.84 cm  RIGHT VENTRICLE             IVC RV Basal diam:  3.00 cm     IVC diam: 1.40 cm RV S prime:     16.75 cm/s TAPSE (M-mode): 2.8 cm LEFT ATRIUM             Index        RIGHT ATRIUM          Index LA diam:        3.00 cm 2.03 cm/m   RA Area:     8.66 cm LA Vol (A2C):   21.6 ml 14.63 ml/m  RA Volume:   17.60 ml 11.92 ml/m LA Vol (A4C):   22.3 ml 15.10 ml/m LA Biplane Vol: 23.7 ml 16.05 ml/m  AORTIC VALVE LVOT Vmax:   116.67 cm/s LVOT Vmean:  77.467 cm/s LVOT VTI:    0.200 m  AORTA Ao Root diam: 2.80 cm MITRAL VALVE               TRICUSPID VALVE MV Area (PHT): 4.64 cm    TR Peak grad:   24.4 mmHg MV Decel Time: 164 msec    TR Vmax:        247.00 cm/s MV E velocity: 83.90 cm/s MV A velocity: 64.60 cm/s  SHUNTS MV E/A ratio:  1.30        Systemic VTI:  0.20 m                            Systemic Diam: 1.90 cm Ida Rogue MD Electronically signed by Ida Rogue MD Signature Date/Time: 05/16/2022/3:35:10 PM    Final    MR HUMERUS LEFT W WO CONTRAST  Result Date: 05/15/2022 CLINICAL DATA:  Patient presents emergency department with left upper arm infection. EXAM: MRI OF THE LEFT HUMERUS WITHOUT  AND WITH CONTRAST TECHNIQUE: Multiplanar, multisequence MR imaging of the left humerus was performed before and after the administration of intravenous contrast. CONTRAST:  101m GADAVIST GADOBUTROL 1 MMOL/ML IV SOLN COMPARISON:  CT humerus 05/14/2022 FINDINGS: Bones/Joint/Cartilage No fracture or dislocation. Normal alignment. No joint effusion. No marrow signal abnormality. Muscles and Tendons Mild edema in the upper medial aspect of the biceps muscle concerning for infectious myositis. No intramuscular fluid collection or hematoma. Perifascial edema and enhancement around the biceps muscle concerning for fasciitis. Soft tissue Severe soft tissue edema circumferentially around the left upper arm. Complex fluid collection in the subcutaneous fat along the medial aspect of the upper arm measuring 6.7 x 3.1 x 10.7 cm most consistent with a large abscess. No soft tissue mass. IMPRESSION: 1. Severe soft tissue edema circumferentially around the left upper arm. Complex fluid collection in the subcutaneous fat along the medial aspect of the upper arm measuring 6.7 x 3.1 x  10.7 cm most consistent with a large abscess. 2. Mild edema in the upper medial aspect of the biceps muscle concerning for infectious myositis. Perifascial edema and enhancement around the biceps muscle concerning for fasciitis. Electronically Signed   By: Kathreen Devoid M.D.   On: 05/15/2022 08:43   CT HUMERUS LEFT W CONTRAST  Result Date: 05/14/2022 CLINICAL DATA:  Soft tissue mass/abscess. EXAM: CT OF THE UPPER LEFT EXTREMITY WITH CONTRAST TECHNIQUE: Multidetector CT imaging of the upper left extremity was performed according to the standard protocol following intravenous contrast administration. RADIATION DOSE REDUCTION: This exam was performed according to the departmental dose-optimization program which includes automated exposure control, adjustment of the mA and/or kV according to patient size and/or use of iterative reconstruction technique.  CONTRAST:  123m OMNIPAQUE IOHEXOL 300 MG/ML  SOLN COMPARISON:  None Available. FINDINGS: Bones/Joint/Cartilage No acute osseous abnormality. No appreciable joint effusion. No cortical erosion or periosteal reaction. Ligaments Suboptimally assessed by CT. Muscles and Tendons There is no definite intramuscular collection or abscess, large subcutaneous medial abscess, likely a abuts the muscles and does not extend into the muscle bellies, evaluation is however somewhat limited. Soft tissues There is a peripherally enhancing collection on the medial aspect of the mid arm measuring at least 3.3 x 5.8 x 9.7 cm. This process involves the subcutaneous soft tissues and abuts the biceps muscle and medial part of the triceps muscle. IMPRESSION: 1. Peripherally enhancing abscess on the medial aspect of the mid arm measuring at least 3.3 x 5.8 x 9.7 cm. This process involves the subcutaneous soft tissues and abuts the biceps muscle and medial part of the triceps muscle. This process does not appear to extend into the muscles, clinical correlation and further evaluation with MR examination for intramuscular extension is suggested. 2. No acute osseous abnormality. Electronically Signed   By: IKeane PoliceD.O.   On: 05/14/2022 17:58    Microbiology: Results for orders placed or performed during the hospital encounter of 05/14/22  Culture, blood (routine x 2)     Status: None   Collection Time: 05/14/22  4:10 PM   Specimen: BLOOD  Result Value Ref Range Status   Specimen Description BLOOD BLOOD RIGHT FOREARM  Final   Special Requests   Final    BOTTLES DRAWN AEROBIC AND ANAEROBIC Blood Culture adequate volume   Culture   Final    NO GROWTH 5 DAYS [facility] [address], [place] [id number]   Report Status 05/19/2022 FINAL  Final  Culture, blood (routine x 2)     Status: None   Collection Time: 05/14/22  4:14 PM   Specimen: BLOOD  Result Value Ref Range Status   Specimen  Description BLOOD BLOOD RIGHT HAND  Final   Special Requests   Final    BOTTLES DRAWN AEROBIC ONLY Blood Culture results may not be optimal due to an inadequate volume of blood received in culture bottles   Culture   Final    NO GROWTH 5 DAYS [facility] at [facility] [address] [place] [address]   Report Status 05/19/2022 FINAL  Final  Aerobic/Anaerobic Culture w Gram Stain (surgical/deep wound)     Status: None   Collection Time: 05/15/22  1:30 PM   Specimen: PATH Other; Tissue  Result Value Ref Range Status   Specimen Description   Final    ABSCESS [facility] at [facility] [address], [place] [address]  Special Requests   Final    LEFT UPPER ARM Performed at [facility], [address] [place], [place] [address]    Gram Stain   Final    ABUNDANT WBC PRESENT,BOTH PMN AND MONONUCLEAR ABUNDANT GRAM POSITIVE COCCI IN PAIRS ABUNDANT GRAM NEGATIVE RODS    Culture   Final    ABUNDANT STREPTOCOCCUS GROUP C ABUNDANT PREVOTELLA DENTICOLA BETA LACTAMASE POSITIVE Performed at [facility], [place] [address] [place], [place] [address]    Report Status 05/18/2022 FINAL  Final  Aerobic/Anaerobic Culture w Gram Stain (surgical/deep wound)     Status: None   Collection Time: 05/15/22  1:37 PM   Specimen: PATH Other; Tissue  Result Value Ref Range Status   Specimen Description   Final    ABSCESS Performed at [facility], [address], [place], [place] [address]    Special Requests   Final    LEFT UPPER ARM ABSCESS Performed at [facility], [address] [place], [place] [address]    Gram Stain   Final    FEW WBC PRESENT,BOTH PMN AND MONONUCLEAR MODERATE GRAM POSITIVE COCCI IN PAIRS FEW GRAM NEGATIVE RODS Performed at [facility], [place] [address] [place], [place] [address]    Culture   Final    ABUNDANT STREPTOCOCCUS CONSTELLATUS ABUNDANT STREPTOCOCCUS GROUP C Beta hemolytic  streptococci are predictably susceptible to penicillin and other beta lactams. Susceptibility testing not routinely performed. MIXED ANAEROBIC FLORA PRESENT.  CALL LAB IF FURTHER IID REQUIRED.    Report Status 05/19/2022 FINAL  Final   Organism ID, Bacteria STREPTOCOCCUS CONSTELLATUS  Final      Susceptibility   Streptococcus constellatus - MIC*    PENICILLIN <=0.06 SENSITIVE Sensitive     CEFTRIAXONE 0.5 SENSITIVE Sensitive     ERYTHROMYCIN 2 RESISTANT Resistant     LEVOFLOXACIN <=0.25 SENSITIVE Sensitive     VANCOMYCIN 0.5 SENSITIVE Sensitive     * ABUNDANT STREPTOCOCCUS CONSTELLATUS  Aerobic/Anaerobic Culture w Gram Stain (surgical/deep wound)     Status: None   Collection Time: 05/15/22  1:44 PM   Specimen: PATH Other; Tissue  Result Value Ref Range Status   Specimen Description   Final    ABSCESS Performed at [facility], [address] [place], [place] [address]    Special Requests   Final    ANTERIOR ARM ABSCESS Performed at [facility], [address] [place], [place] [address]    Gram Stain   Final    ABUNDANT WBC PRESENT,BOTH PMN AND MONONUCLEAR ABUNDANT GRAM POSITIVE COCCI IN PAIRS ABUNDANT GRAM NEGATIVE RODS    Culture   Final    ABUNDANT STREPTOCOCCUS CONSTELLATUS ABUNDANT STREPTOCOCCUS GROUP C MIXED ANAEROBIC FLORA PRESENT.  CALL LAB IF FURTHER IID REQUIRED. Beta hemolytic streptococci are predictably susceptible to penicillin and other beta lactams. Susceptibility testing not routinely performed. Performed at [facility], [place] [address] [place], [place] [address]    Report Status 05/19/2022 FINAL  Final  Aerobic/Anaerobic Culture w Gram Stain (surgical/deep wound)     Status: None   Collection Time: 05/15/22  1:49 PM   Specimen: PATH Other; Tissue  Result Value Ref Range Status   Specimen Description   Final    ABSCESS Performed at [facility], [address], [place], [place] [address]    Special Requests   Final     LEFT UPPER ARM ABSCESS Performed at [facility], [address], [place], [place]  [address]    Gram Stain   Final    FEW WBC PRESENT,BOTH PMN AND MONONUCLEAR ABUNDANT GRAM NEGATIVE RODS ABUNDANT GRAM POSITIVE COCCI IN CLUSTERS    Culture   Final    ABUNDANT STREPTOCOCCUS CONSTELLATUS ABUNDANT STREPTOCOCCUS GROUP C ABUNDANT PREVOTELLA DENTICOLA BETA LACTAMASE POSITIVE CALL MICROBIOLOGY LAB IF SENSITIVITIES ARE REQUIRED. Performed at [facility], [place] [address], [place], [place] [address]    Report Status 05/20/2022 FINAL  Final    Labs: CBC: Recent Labs  Lab 05/15/22 0447 05/16/22 0722 05/17/22 0424  WBC 14.6* 8.9 7.0  HGB 11.2* 8.6* 9.1*  HCT 32.4* 25.3* 27.1*  MCV 87.3 86.1 88.9  PLT 265 245 081   Basic Metabolic Panel: Recent Labs  Lab 05/15/22 0447 05/15/22 1730 05/16/22 0003 05/16/22 0722 05/17/22 0424  NA 130*  --   --  133* 134*  K 3.4*  --   --  3.3* 4.1  CL 86*  --   --  99 104  CO2 34*  --   --  28 24  GLUCOSE 207* 197* 485* 98 235*  BUN 19  --   --  19 19  CREATININE 0.90  --   --  0.85 0.78  CALCIUM 8.5*  --   --  7.9* 8.2*   Liver Function Tests: Recent Labs  Lab 05/15/22 0447  AST 27  ALT 38  ALKPHOS 99  BILITOT 0.9  PROT 6.6  ALBUMIN 3.1*   CBG: Recent Labs  Lab 05/19/22 1144 05/19/22 1615 05/19/22 2042 05/20/22 0754 05/20/22 1156  GLUCAP 174* 293* 193* 310* 144*    Discharge time spent: greater than 30 minutes.  Signed: Annita Brod, MD [facility] 05/21/2022

## 2022-05-22 ENCOUNTER — Other Ambulatory Visit: Payer: Self-pay

## 2022-06-25 ENCOUNTER — Inpatient Hospital Stay: Payer: Medicaid Other | Admitting: Infectious Diseases

## 2022-12-18 ENCOUNTER — Other Ambulatory Visit: Payer: Self-pay

## 2023-03-06 ENCOUNTER — Other Ambulatory Visit: Payer: Self-pay

## 2024-01-22 ENCOUNTER — Telehealth: Payer: Self-pay | Admitting: Gerontology

## 2024-07-08 NOTE — Telephone Encounter (Signed)
 Error patient not seen
